# Patient Record
Sex: Male | Born: 1945 | Race: White | Hispanic: No | Marital: Married | Smoking: Former smoker
Health system: Southern US, Community
[De-identification: ages and names within clinical notes are randomized; demographics above are authoritative.]

## PROBLEM LIST (undated history)

## (undated) DIAGNOSIS — I447 Left bundle-branch block, unspecified: Secondary | ICD-10-CM

## (undated) DIAGNOSIS — Z77098 Contact with and (suspected) exposure to other hazardous, chiefly nonmedicinal, chemicals: Secondary | ICD-10-CM

## (undated) DIAGNOSIS — E119 Type 2 diabetes mellitus without complications: Secondary | ICD-10-CM

## (undated) DIAGNOSIS — Z860100 Personal history of colon polyps, unspecified: Secondary | ICD-10-CM

## (undated) DIAGNOSIS — Z950 Presence of cardiac pacemaker: Secondary | ICD-10-CM

## (undated) DIAGNOSIS — F0390 Unspecified dementia without behavioral disturbance: Secondary | ICD-10-CM

## (undated) DIAGNOSIS — I428 Other cardiomyopathies: Secondary | ICD-10-CM

## (undated) DIAGNOSIS — D649 Anemia, unspecified: Secondary | ICD-10-CM

## (undated) DIAGNOSIS — E7800 Pure hypercholesterolemia, unspecified: Secondary | ICD-10-CM

## (undated) DIAGNOSIS — C61 Malignant neoplasm of prostate: Secondary | ICD-10-CM

## (undated) DIAGNOSIS — I10 Essential (primary) hypertension: Secondary | ICD-10-CM

## (undated) DIAGNOSIS — I441 Atrioventricular block, second degree: Secondary | ICD-10-CM

## (undated) HISTORY — DX: Personal history of colon polyps, unspecified: Z86.0100

## (undated) HISTORY — DX: Left bundle-branch block, unspecified: I44.7

## (undated) HISTORY — DX: Atrioventricular block, second degree: I44.1

## (undated) HISTORY — PX: OTHER SURGICAL HISTORY: SHX169

## (undated) HISTORY — PX: PROSTATE BIOPSY: SHX241

## (undated) HISTORY — PX: TONSILLECTOMY: SUR1361

## (undated) HISTORY — PX: INCISION AND DRAINAGE PERIRECTAL ABSCESS: SHX1804

## (undated) HISTORY — PX: BASAL CELL CARCINOMA EXCISION: SHX1214

## (undated) HISTORY — DX: Personal history of colonic polyps: Z86.010

## (undated) HISTORY — DX: Other cardiomyopathies: I42.8

---

## 1978-11-08 HISTORY — PX: INGUINAL HERNIA REPAIR: SUR1180

## 2005-07-26 ENCOUNTER — Ambulatory Visit (HOSPITAL_COMMUNITY): Admission: RE | Admit: 2005-07-26 | Discharge: 2005-07-26 | Payer: Self-pay | Admitting: *Deleted

## 2005-07-26 ENCOUNTER — Encounter (INDEPENDENT_AMBULATORY_CARE_PROVIDER_SITE_OTHER): Payer: Self-pay | Admitting: Specialist

## 2006-11-13 LAB — HM COLONOSCOPY

## 2008-12-06 ENCOUNTER — Ambulatory Visit (HOSPITAL_COMMUNITY): Admission: RE | Admit: 2008-12-06 | Discharge: 2008-12-06 | Payer: Self-pay | Admitting: *Deleted

## 2008-12-06 ENCOUNTER — Encounter (INDEPENDENT_AMBULATORY_CARE_PROVIDER_SITE_OTHER): Payer: Self-pay | Admitting: *Deleted

## 2011-03-23 NOTE — Op Note (Signed)
NAME:  Kevin [name], Kevin [name] NO.:  [id number]   MEDICAL RECORD NO.:  [id number]          PATIENT TYPE:  AMB   LOCATION:  ENDO                         FACILITY:  [facility]   PHYSICIAN:  Georgiana Spinner, M.D.    DATE OF BIRTH:  [birth year]   DATE OF PROCEDURE:  DATE OF DISCHARGE:                               OPERATIVE REPORT   PROCEDURE:  Colonoscopy.   INDICATIONS:  Colon polyps.   ANESTHESIA:  Fentanyl 75 mcg, Versed 8 mg.   PROCEDURE:  With the patient mildly sedated in the left lateral  decubitus position, the Pentax videoscopic colonoscope was inserted in  the rectum after normal rectal exam was performed by me, and it was  passed under direct vision to the cecum with pressure applied.  The  cecum was identified by the ileocecal valve and appendiceal orifice,  both which were photographed.  From this point the colonoscope was  slowly withdrawn, taking circumferential views of colonic mucosa,  stopping only at the ascending colon near the hepatic flexure, where a  polyp was seen, photographed and removed using hot biopsy forceps  technique on a setting of 20/150 blended current.  We next stopped at 20  cm from the anal verge, at which point a second polyp was seen.  It,  too, was photographed and it was removed, again, using the same hot  biopsy forceps technique.  The endoscope was pulled back to the rectum,  which appeared normal on direct and showed hemorrhoids on retroflexed  view.  The endoscope was straightened and withdrawn.  The patient's  vital signs, pulse oximeter remained stable.  The patient tolerated the  procedure well without apparent complications.   FINDINGS:  Two small polyps as described above in ascending colon and at  20 cm from the anal verge.  Internal hemorrhoids were also noted,  otherwise an unremarkable exam.   PLAN:  Await biopsy report.  The patient will call me for results and  follow up with me as an outpatient.     ______________________________  Georgiana Spinner, M.D.     GMO/MEDQ  D:  12/06/2008  T:  12/06/2008  Job:  [id number]

## 2011-03-26 NOTE — Op Note (Signed)
NAME:  DIONTRE, HARPS NO.:  [id number]   MEDICAL RECORD NO.:  [id number]          PATIENT TYPE:  AMB   LOCATION:  ENDO                         FACILITY:  [facility]   PHYSICIAN:  Georgiana Spinner, M.D.    DATE OF BIRTH:  04/18/1946   DATE OF PROCEDURE:  07/26/2005  DATE OF DISCHARGE:                                 OPERATIVE REPORT   PROCEDURE:  Upper endoscopy with biopsy.   INDICATIONS:  Gastroesophageal reflux disease.   ANESTHESIA:  Demerol 50, Versed 7 milligrams.   DESCRIPTION OF PROCEDURE:  With the patient mildly sedated in the left  lateral decubitus position, the Olympus videoscopic endoscope was inserted  in the mouth, passed under direct vision through the esophagus which  appeared normal except there was question of Barrett's esophagus or  esophagitis seen distally which was photographed and biopsied. We entered  into the stomach. The fundus, body, antrum, duodenal bulb, second portion of  duodenum were visualized. From this point, the endoscope was slowly  withdrawn taking circumferential views of the duodenal mucosa until the  endoscope had been pulled back into the stomach, placed in retroflexion to  view the stomach from below. The endoscope was straightened and withdrawn  taking circumferential views of the remaining gastric and esophageal mucosa.  The patient's vital signs and pulse oximeter remained stable. The patient  tolerated the procedure well without apparent complications.   FINDINGS:  Question of Barrett's esophagus versus esophagitis, await biopsy  report. The patient will call me for results and follow-up with me as an  outpatient. Proceed to colonoscopy as planned.           ______________________________  Georgiana Spinner, M.D.     GMO/MEDQ  D:  07/26/2005  T:  07/26/2005  Job:  [id number]

## 2011-03-26 NOTE — Op Note (Signed)
NAME:  Kevin [name], Kevin [name] NO.:  [id number]   MEDICAL RECORD NO.:  [id number]          PATIENT TYPE:  AMB   LOCATION:  ENDO                         FACILITY:  [facility]   PHYSICIAN:  Georgiana Spinner, M.D.    DATE OF BIRTH:  12/18/1945   DATE OF PROCEDURE:  07/26/2005  DATE OF DISCHARGE:                                 OPERATIVE REPORT   PROCEDURE:  Colonoscopy.   INDICATIONS:  Colon polyps.   ANESTHESIA:  Demerol 40, Versed 3 milligrams.   PROCEDURE:  With the patient mildly sedated in the left lateral decubitus  position, the Olympus videoscopic colonoscope was inserted in the rectum  after a normal rectal exam and passed under direct vision to the cecum  identified by the ileocecal valve and appendiceal orifice. From this point,  the colonoscope was slowly withdrawn taking circumferential views of the  colonic mucosa stopping only in the descending colon where there were two  polyps seen, photographed and removed, one using snare cautery technique,  the other hot biopsy forceps technique, both with a setting of 20/200  blended current. The endoscope was then pulled all the way back to the  rectum which appeared normal other than a small polyp that was also removed  using hot biopsy forceps technique with the same setting and on retroflexed  view, there were internal hemorrhoids seen. The endoscope was straightened  and withdrawn. The patient's vital signs and pulse oximeter remained stable.  The patient tolerated the procedure well without apparent complications.   FINDINGS:  Internal hemorrhoids, polyps of descending colon and rectum.  Await biopsy report. The patient will call me for results and follow-up with  me as an outpatient           ______________________________  Georgiana Spinner, M.D.     GMO/MEDQ  D:  07/26/2005  T:  07/26/2005  Job:  [id number]

## 2011-04-14 ENCOUNTER — Ambulatory Visit (INDEPENDENT_AMBULATORY_CARE_PROVIDER_SITE_OTHER): Payer: BC Managed Care – PPO | Admitting: Family Medicine

## 2011-04-14 ENCOUNTER — Encounter: Payer: Self-pay | Admitting: Family Medicine

## 2011-04-14 ENCOUNTER — Telehealth: Payer: Self-pay | Admitting: Family Medicine

## 2011-04-14 DIAGNOSIS — G4700 Insomnia, unspecified: Secondary | ICD-10-CM

## 2011-04-14 DIAGNOSIS — G3184 Mild cognitive impairment, so stated: Secondary | ICD-10-CM

## 2011-04-14 DIAGNOSIS — F411 Generalized anxiety disorder: Secondary | ICD-10-CM

## 2011-04-14 DIAGNOSIS — F419 Anxiety disorder, unspecified: Secondary | ICD-10-CM

## 2011-04-14 DIAGNOSIS — E119 Type 2 diabetes mellitus without complications: Secondary | ICD-10-CM

## 2011-04-14 NOTE — Telephone Encounter (Signed)
Pt called and forgot to discuss a problem he is having with erectile dysfunction. Pt req a call back from Dr Caryl Never asap today.

## 2011-04-14 NOTE — Progress Notes (Signed)
  Subjective:    Patient ID: Kevin [name], male    DOB: 04/25/1946, 65 y.o.   MRN: [id number]  HPI Patient is seen to establish care. He gets most of his care through the [place] [facility].  He has chronic problems including type 2 diabetes, chronic anxiety, history of kidney stones. He takes simvastatin and lisinopril but denies history of hyperlipidemia or hypertension. Medications are reviewed. Also takes Ambien intermittently for sleep issues. Lorazepam rarely for severe anxiety.  Patient denies any myalgias from statin use but does have some possible mild short-term memory issues and wonders if this may be statin related.  History of hernia repair 1988 no other surgeries. No known drug allergies.  Family history is that of mother ovarian cancer age 70. Father died of leukemia at 37  Patient is married. Quit smoking 1985. No alcohol use. Tetanus 2010. Colonoscopy 2008.   Review of Systems  Constitutional: Negative for fever, chills, activity change, appetite change and fatigue.  Eyes: Negative for visual disturbance.  Respiratory: Negative for cough and shortness of breath.   Cardiovascular: Negative for chest pain, palpitations and leg swelling.  Gastrointestinal: Negative for abdominal pain.  Genitourinary: Negative for dysuria.  Musculoskeletal: Negative for back pain.  Skin: Negative for rash.  Neurological: Negative for dizziness, seizures and syncope.  Hematological: Negative for adenopathy. Does not bruise/bleed easily.  Psychiatric/Behavioral: Negative for dysphoric mood. The patient is nervous/anxious.        Objective:   Physical Exam  Constitutional: He is oriented to person, place, and time. He appears well-developed and well-nourished. No distress.  HENT:  Right Ear: External ear normal.  Left Ear: External ear normal.  Mouth/Throat: Oropharynx is clear and moist.  Neck: Neck supple. No thyromegaly present.  Cardiovascular: Normal rate, regular rhythm and  normal heart sounds.   No murmur heard. Pulmonary/Chest: Effort normal and breath sounds normal. No respiratory distress. He has no wheezes. He has no rales.  Musculoskeletal: He exhibits no edema.  Lymphadenopathy:    He has no cervical adenopathy.  Neurological: He is alert and oriented to person, place, and time.  Skin: No rash noted.  Psychiatric: He has a normal mood and affect.          Assessment & Plan:  #1 type 2 diabetes. Followed by the [facility] #2 question of dyslipidemia. Question mild cognitive impairment related to simvastatin. [facility] possible hydrophilic statin such as pravastatin or Crestor #3 history of chronic anxiety which appear to be more situational  #4 history of kidney stones

## 2011-04-14 NOTE — Telephone Encounter (Signed)
This is all yours. 

## 2011-04-14 NOTE — Telephone Encounter (Signed)
Called pt and message left.

## 2011-04-16 ENCOUNTER — Encounter: Payer: Self-pay | Admitting: Family Medicine

## 2011-04-16 DIAGNOSIS — E119 Type 2 diabetes mellitus without complications: Secondary | ICD-10-CM | POA: Insufficient documentation

## 2011-04-16 DIAGNOSIS — G4700 Insomnia, unspecified: Secondary | ICD-10-CM | POA: Insufficient documentation

## 2011-04-16 DIAGNOSIS — F419 Anxiety disorder, unspecified: Secondary | ICD-10-CM | POA: Insufficient documentation

## 2011-04-16 MED ORDER — SILDENAFIL CITRATE 100 MG PO TABS: 100.0000 mg | ORAL_TABLET | ORAL | Status: DC | PRN

## 2011-04-16 NOTE — Telephone Encounter (Signed)
I have called this pt and left message and have not heard back.

## 2011-04-16 NOTE — Telephone Encounter (Signed)
Pt has hx of erectile dysfunction. Has use Viagra in past.  Refill Viagra 100 mg one po qd prn , disp #10 with 11 refills.

## 2011-04-16 NOTE — Telephone Encounter (Signed)
Rx sent, pt aware 

## 2011-04-16 NOTE — Telephone Encounter (Signed)
I spoke with wife at home number and his work number was incorrect.   Pt cell is [phone]

## 2011-08-23 ENCOUNTER — Encounter (INDEPENDENT_AMBULATORY_CARE_PROVIDER_SITE_OTHER): Payer: Self-pay | Admitting: General Surgery

## 2011-08-23 ENCOUNTER — Ambulatory Visit (INDEPENDENT_AMBULATORY_CARE_PROVIDER_SITE_OTHER): Payer: BC Managed Care – PPO | Admitting: General Surgery

## 2011-08-23 VITALS — BP 142/68 | HR 51 | Temp 98.7°F | Ht 75.0 in | Wt 205.8 lb

## 2011-08-23 DIAGNOSIS — K611 Rectal abscess: Secondary | ICD-10-CM

## 2011-08-23 DIAGNOSIS — K612 Anorectal abscess: Secondary | ICD-10-CM

## 2011-08-23 NOTE — Patient Instructions (Signed)
Tomorrow soak and remove the packing. Soak twice daily and after bowel movements. Keep covered with gauze as long as there is any drainage. Complete her antibiotics and call if you're not feeling steadily better.

## 2011-08-23 NOTE — Progress Notes (Signed)
Subjective:   Rectal abscess  Patient ID: Kevin [name], male   DOB: 1946/07/04, 65 y.o.   MRN: [id number]  HPI Patient is a pleasant 65 year old male who has a history of a perirectal abscess drained by Dr. Luisa Hart in early 2011. He was seen in June 2011 with a probable fistula and Dr. Luisa Hart recommended exam under anesthesia and fistulotomy. However the patient felt better, thought it might have healed, and did not follow through with surgery. He felt well until about 4 days ago when he again developed pain and swelling at the same site in the left perirectal area. He has not had any drainage.  Past Medical History  Diagnosis Date  . Diabetes mellitus   . Chronic kidney disease     stones  . UTI (urinary tract infection)   . Peri-rectal abscess    Past Surgical History  Procedure Date  . Hernia repair 1980   Current Outpatient Prescriptions  Medication Sig Dispense Refill  . aspirin 81 MG tablet Take 81 mg by mouth daily.        . citalopram (CELEXA) 20 MG tablet Take 20 mg by mouth daily.        Marland Kitchen lisinopril (PRINIVIL,ZESTRIL) 40 MG tablet Take 40 mg by mouth. 1/2 tab daily        . Loratadine 10 MG CAPS Take by mouth daily.        Marland Kitchen LORazepam (ATIVAN) 0.5 MG tablet Take 0.5 mg by mouth. As needed       . metFORMIN (GLUCOPHAGE) 1000 MG tablet Take 1,000 mg by mouth 2 (two) times daily with a meal.        . Saw Palmetto, Serenoa repens, 1000 MG CAPS Take by mouth daily.        . sildenafil (VIAGRA) 100 MG tablet Take 1 tablet (100 mg total) by mouth as needed for erectile dysfunction.  10 tablet  11  . simvastatin (ZOCOR) 40 MG tablet Take 40 mg by mouth. 1/2 tab daily       . zolpidem (AMBIEN) 10 MG tablet Take 10 mg by mouth at bedtime as needed.         Allergies  Allergen Reactions  . Sulfa Antibiotics     Swelling of lips   History  Substance Use Topics  . Smoking status: Former Smoker -- 1.0 packs/day for 20 years    Types: Cigarettes    Quit date: 04/13/1984  .  Smokeless tobacco: Not on file  . Alcohol Use: No    Review of Systems  Constitutional: Negative.   Respiratory: Negative.   Cardiovascular: Negative.   Hematological: Negative.        Objective:   Physical Exam General: Well-developed alert male in no distress Skin: Warm and dry without infection Lungs: Clear without increased work of breathing Cardiovascular: Regular rate and rhythm without murmur. No edema. Abdomen: Generally soft and nontender. Rectal: In the left perirectal space just lateral to the previous I&D site is a 2-3 cm area of tenderness, erythema, and fluctuance. Neuro: Alert and fully oriented. Motor and sensory exam grossly normal.    Assessment:     Recurrent perirectal/perianal abscess. I recommended incision and drainage under local anesthesia today.  Procedure: Under local anesthesia I unroofed the area of fluctuance as close to the anus as possible and drained fairly large amount of thick purulent material. The cavity was packed with iodoform gauze. He will remove this tomorrow and begin soaking.    Plan:  I started him on Keflex 500 mg 3 times a day for the next week. He will call if he is not feeling slightly better. He will return again to see Dr. Luisa Hart in 3 weeks to assess for fistula.

## 2011-09-20 ENCOUNTER — Ambulatory Visit (INDEPENDENT_AMBULATORY_CARE_PROVIDER_SITE_OTHER): Payer: BC Managed Care – PPO | Admitting: Surgery

## 2011-09-20 ENCOUNTER — Encounter (INDEPENDENT_AMBULATORY_CARE_PROVIDER_SITE_OTHER): Payer: Self-pay | Admitting: Surgery

## 2011-09-20 VITALS — BP 118/84 | HR 48 | Temp 98.6°F | Resp 20 | Ht 75.0 in | Wt 202.4 lb

## 2011-09-20 DIAGNOSIS — Z9889 Other specified postprocedural states: Secondary | ICD-10-CM

## 2011-09-20 NOTE — Progress Notes (Signed)
The patient returns today. He was here about 3 weeks ago to have a perirectal abscess drained by Dr. Johna Sheriff. He is doing well. This is a recurrent abscess. I saw him about a year ago for the same problem. In followup we discussed an exam under anesthesia since this was a recurrent problem for him. He held off on this.  Exam: Anal canal was examined. The wound in the left perirectal space is clean, soft, and nontender.  Impression: Recurrent perirectal abscess  Plan: I discussed with him the need for exam under anesthesia since his problem is recurrent. He will return in January to discuss further. Return to clinic if symptoms worsen.

## 2011-09-20 NOTE — Patient Instructions (Signed)
Peri-Rectal Abscess Your caregiver has diagnosed you as having a peri-rectal abscess. This is an infected area near the rectum that is filled with pus. If the abscess is near the surface of the skin, your caregiver may open (incise) the area and drain the pus. HOME CARE INSTRUCTIONS   If your abscess was opened up and drained. A small piece of gauze may be placed in the opening so that it can drain. Do not remove the gauze unless directed by your caregiver.   A loose dressing may be placed over the abscess site. Change the dressing as often as necessary to keep it clean and dry.   After the drain is removed, the area may be washed with a gentle antiseptic (soap) four times per day.   A warm sitz bath, warm packs or heating pad may be used for pain relief, taking care not to burn yourself.   Return for a wound check in 1 day or as directed.   An "inflatable doughnut" may be used for sitting with added comfort. These can be purchased at a drugstore or medical supply house.   To reduce pain and straining with bowel movements, eat a high fiber diet with plenty of fruits and vegetables. Use stool softeners as recommended by your caregiver. This is especially important if narcotic type pain medications were prescribed as these may cause marked constipation.   Only take over-the-counter or prescription medicines for pain, discomfort, or fever as directed by your caregiver.  SEEK IMMEDIATE MEDICAL CARE IF:   You have increasing pain that is not controlled by medication.   There is increased inflammation (redness), swelling, bleeding, or drainage from the area.   An oral temperature above 102 F (38.9 C) develops.   You develop chills or generalized malaise (feel lethargic or feel "washed out").     You develop any new symptoms (problems) you feel may be related to your present problem.  Document Released: 10/22/2000 Document Revised: 07/07/2011 Document Reviewed: 10/22/2008 [employer]  Patient Information 2012 [employer]. 

## 2011-10-14 ENCOUNTER — Encounter: Payer: Self-pay | Admitting: Family Medicine

## 2011-10-14 ENCOUNTER — Ambulatory Visit (INDEPENDENT_AMBULATORY_CARE_PROVIDER_SITE_OTHER): Payer: BC Managed Care – PPO | Admitting: Family Medicine

## 2011-10-14 VITALS — BP 140/60 | Temp 98.2°F | Wt 201.0 lb

## 2011-10-14 DIAGNOSIS — E119 Type 2 diabetes mellitus without complications: Secondary | ICD-10-CM

## 2011-10-14 NOTE — Progress Notes (Signed)
  Subjective:    Patient ID: Kevin [name], male    DOB: 10-23-1946, 65 y.o.   MRN: [id number]  HPI  Medical followup. Followed every 6 months by the [facility].  Type 2 diabetes, insomnia and chronic anxiety. Job change within the past 6 months which has reduced stress level somewhat. Medications reviewed.  These are refilled through the [facility] health system. Denies side effects. No recent chest pain. No consistent exercise. Flu vaccine already received. Diabetes stable. No symptoms of hyperglycemia. Regular eye exams.   Review of Systems  Eyes: Negative for visual disturbance.  Respiratory: Negative for shortness of breath.   Cardiovascular: Negative for chest pain.  Genitourinary: Negative for dysuria.  Neurological: Negative for dizziness and headaches.       Objective:   Physical Exam  Constitutional: He appears well-developed and well-nourished. No distress.  HENT:  Mouth/Throat: Oropharynx is clear and moist.  Neck: Neck supple. No thyromegaly present.  Cardiovascular: Normal rate and regular rhythm.   Pulmonary/Chest: Effort normal and breath sounds normal. No respiratory distress. He has no wheezes. He has no rales.  Musculoskeletal: He exhibits no edema.  Lymphadenopathy:    He has no cervical adenopathy.          Assessment & Plan:  Type 2 diabetes. Reported good control. Patient does not recall last A1c. He'll send copy of recent labs from [place]. Schedule complete physical exam.

## 2011-10-14 NOTE — Patient Instructions (Signed)
[facility]. Consider scheduling complete physical at some point next year.

## 2011-10-29 ENCOUNTER — Encounter (INDEPENDENT_AMBULATORY_CARE_PROVIDER_SITE_OTHER): Payer: Self-pay | Admitting: Surgery

## 2011-10-29 ENCOUNTER — Ambulatory Visit (INDEPENDENT_AMBULATORY_CARE_PROVIDER_SITE_OTHER): Payer: BC Managed Care – PPO | Admitting: Surgery

## 2011-10-29 VITALS — BP 140/80 | HR 48 | Temp 98.2°F | Resp 12 | Ht 75.0 in | Wt 205.0 lb

## 2011-10-29 DIAGNOSIS — K603 Anal fistula: Secondary | ICD-10-CM | POA: Insufficient documentation

## 2011-10-29 MED ORDER — AMOXICILLIN-POT CLAVULANATE 875-125 MG PO TABS: 1.0000 | ORAL_TABLET | Freq: Two times a day (BID) | ORAL | Status: AC

## 2011-10-29 NOTE — Patient Instructions (Signed)
Anal Fistula An anal fistula is an abnormal tunnel that leads from the anal canal (which carries stool from the large intestine) to a hole in the skin near the anus (the opening through which stool passes out of your body).  CAUSES  Food you eat goes from your stomach into your intestine. As the food is digested, waste material (stool) forms. Stool passes through your large intestine, through the rectum and anal canal, and out of your body through the anus.  The anus has a number of tiny glands (clusters of specialized cells) that make lubricating fluid. Sometimes these glands can become infected. This type of infection may lead to the development of a pocket of pus (abscess). An anal fistula often develops after an infection or abscess; It is nearly always caused by a past anorectal abscess. You are at a higher risk of developing an anal fistula if you have:  Had an anal abscess.   Chronic inflammatory bowel disease, such as Crohn's disease or ulcerative colitis.   Conditions in which there are inflamed outpouchings of the intestinal wall (diverticulitis).   Colon or rectal cancer.   Sexually transmitted diseases involving the rectum, such as gonorrhea or chlamydia.   A history of anal radiation treatments, injury, or surgery.   An HIV infection.   A problem that has required treatment with steroid medicines for more than a short time.  SYMPTOMS   Anal pain, particularly around the area of a past abscess.   Drainage of pus, blood, stool or mucus from an opening in the skin.   Swelling around the skin opening.   Worn off skin around the opening.   A hot or red area near the anus.   Diarrhea.   Fever and chills.   Tiredness (fatigue).  DIAGNOSIS   In some cases, the opening of an anal fistula is easily seen during a physical exam.   A probe or scope may be used to help locate the opening of the fistula. In some cases, dye can be injected into the fistula opening, and X-rays  can be taken to find the exact location and path of the fistula.   A sample (biopsy) of the fistula tissue or anus may be taken to check for cancer.  TREATMENT   An anal fistula may need surgery to open it up and allow it to heal. This type of operation is called a fistulotomy.   A specialized kind of glue or plug to seal the fistula may be used.   An antibiotic may be prescribed to treat an existing infection.  HOME CARE INSTRUCTIONS   Take medications (such as antibiotics) as prescribed by your caregiver.   Only take over-the-counter or prescription medicine for pain, discomfort, or fever as directed by your caregiver.   Follow your prescribed diet. You may need a higher fiber diet to help avoid constipation.   Drink lots of water as directed.   Use a stool softener or laxative, if recommended.   A warm sitz bath several times a day may be soothing, as well as help with healing.   Follow excellent hygiene to keep the anal area as clean as possible. Consider using pre-moistened towelettes to keep the anal area clean after using the bathroom.  SEEK MEDICAL CARE IF:  You have increased pain not controlled with medications.   You notice new swelling, redness, or hotness in the anal area.   You develop any problems passing urine.   You develop a fever (more   than 100.5 F (38.1 C).  SEEK IMMEDIATE MEDICAL CARE IF:  You have severe, intolerable pain.   You have severe problems passing urine or cannot pass any urine at all.   You develop an unexplained oral temperature above 102.0 F (38.9 C).   You notice new or worsening leakage of blood, pus, mucus, or stool.  Document Released: 10/07/2008 Document Revised: 07/07/2011 Document Reviewed: 10/07/2008 [employer] Patient Information 2012 [employer]. 

## 2011-10-29 NOTE — Progress Notes (Signed)
Patient ID: BEN HABERMANN, male   DOB: 07-Sep-1946, 65 y.o.   MRN: [id number]  Chief Complaint  Patient presents with  . Follow-up    rectal pain    HPI AMIL BOUWMAN is a 65 y.o. male. HPI The patient returns in followup for recurrent perirectal abscesses. He's had drainage from his left anal canal.. No fevers or chills. The drainage is stopped. Past Medical History  Diagnosis Date  . Diabetes mellitus   . Chronic kidney disease     stones  . UTI (urinary tract infection)   . Peri-rectal abscess     Past Surgical History  Procedure Date  . Hernia repair 1980    [facility]    Family History  Problem Relation Age of Onset  . Cancer Mother     ovarian  . Leukemia Father     Social History History  Substance Use Topics  . Smoking status: Former Smoker -- 1.0 packs/day for 20 years    Types: Cigarettes    Quit date: 04/13/1984  . Smokeless tobacco: Not on file  . Alcohol Use: No    Allergies  Allergen Reactions  . Sulfa Antibiotics     Swelling of lips    Current Outpatient Prescriptions  Medication Sig Dispense Refill  . aspirin 81 MG tablet Take 81 mg by mouth daily.        . citalopram (CELEXA) 20 MG tablet Take 20 mg by mouth daily.        Marland Kitchen lisinopril (PRINIVIL,ZESTRIL) 40 MG tablet 1/2 tab daily       . Loratadine 10 MG CAPS Take by mouth daily.        Marland Kitchen LORazepam (ATIVAN) 0.5 MG tablet Take 0.5 mg by mouth. As needed       . metFORMIN (GLUCOPHAGE) 1000 MG tablet Take 1,000 mg by mouth 2 (two) times daily with a meal.        . Saw Palmetto, Serenoa repens, 1000 MG CAPS Take by mouth daily.        . sildenafil (VIAGRA) 100 MG tablet Take 1 tablet (100 mg total) by mouth as needed for erectile dysfunction.  10 tablet  11  . simvastatin (ZOCOR) 40 MG tablet 1/2 tab daily      . zolpidem (AMBIEN) 10 MG tablet Take 10 mg by mouth at bedtime as needed.        Marland Kitchen amoxicillin-clavulanate (AUGMENTIN) 875-125 MG per tablet Take 1 tablet by mouth 2 (two) times  daily.  20 tablet  0    Review of Systems Review of Systems  Constitutional: Negative for fever, chills and unexpected weight change.  HENT: Negative for hearing loss, congestion, sore throat, trouble swallowing and voice change.   Eyes: Negative for visual disturbance.  Respiratory: Negative for cough and wheezing.   Cardiovascular: Negative for chest pain, palpitations and leg swelling.  Gastrointestinal: Negative for nausea, vomiting, abdominal pain, diarrhea, constipation, blood in stool, abdominal distention, anal bleeding and rectal pain.  Genitourinary: Negative for hematuria and difficulty urinating.  Musculoskeletal: Negative for arthralgias.  Skin: Negative for rash and wound.  Neurological: Negative for seizures, syncope, weakness and headaches.  Hematological: Negative for adenopathy. Does not bruise/bleed easily.  Psychiatric/Behavioral: Negative for confusion.    Blood pressure 140/80, pulse 48, temperature 98.2 F (36.8 C), resp. rate 12, height 6\' 3"  (1.905 m), weight 205 lb (92.987 kg).  Physical Exam Physical Exam  Constitutional: He appears well-developed and well-nourished.  HENT:  Head: Normocephalic and atraumatic.  Eyes: EOM are normal. Pupils are equal, round, and reactive to light.  Neck: Normal range of motion.  Cardiovascular: Normal rate and regular rhythm.   Pulmonary/Chest: Effort normal and breath sounds normal.  Abdominal: Soft. Bowel sounds are normal.  Genitourinary:       Fistula tract there is left lateral anal canal about 2 cm in the anal verge with mild erythema. No evidence of abscess.    Data Reviewed   Assessment    Fistula in ano    Plan    I discussed medical and surgical options of treatment. Exam under anesthesia with possible fistulotomy recommended. I discussed the use of a fistula plug and a seton. Risk of bleeding, infection, fecal incontinence, pelvic sepsis, exacerbation medical problems, DVT, and cardiovascular  complications of the procedure. If this fails, he may require an advancement flap. He wishes to proceed.       Dontavius Keim A. 10/29/2011, 10:24 AM

## 2011-11-11 ENCOUNTER — Ambulatory Visit
Admission: RE | Admit: 2011-11-11 | Discharge: 2011-11-11 | Disposition: A | Discharge status: Home / Self Care | Payer: PRIVATE HEALTH INSURANCE | Source: Ambulatory Visit | Attending: Surgery | Admitting: Surgery

## 2011-11-11 ENCOUNTER — Other Ambulatory Visit (INDEPENDENT_AMBULATORY_CARE_PROVIDER_SITE_OTHER): Payer: Self-pay | Admitting: Surgery

## 2011-11-11 DIAGNOSIS — Z01811 Encounter for preprocedural respiratory examination: Secondary | ICD-10-CM

## 2011-11-22 ENCOUNTER — Encounter (INDEPENDENT_AMBULATORY_CARE_PROVIDER_SITE_OTHER): Payer: BC Managed Care – PPO | Admitting: Surgery

## 2011-11-24 ENCOUNTER — Other Ambulatory Visit: Payer: Self-pay | Admitting: Cardiovascular Disease

## 2011-11-24 ENCOUNTER — Encounter (HOSPITAL_COMMUNITY): Payer: Self-pay | Admitting: Pharmacy Technician

## 2011-11-30 MED ORDER — SODIUM CHLORIDE 0.9 % IR SOLN
80.0000 mg | Status: AC
  Administered 2011-12-01: 80 mg
  Filled 2011-11-30: qty 2

## 2011-12-01 ENCOUNTER — Encounter (HOSPITAL_COMMUNITY): Payer: Self-pay | Admitting: General Practice

## 2011-12-01 ENCOUNTER — Encounter (HOSPITAL_COMMUNITY)
Admission: RE | Disposition: A | Discharge status: Home / Self Care | Payer: Self-pay | Source: Ambulatory Visit | Attending: Cardiovascular Disease

## 2011-12-01 ENCOUNTER — Ambulatory Visit (HOSPITAL_COMMUNITY)
Admission: RE | Admit: 2011-12-01 | Discharge: 2011-12-02 | Disposition: A | Discharge status: Home / Self Care | Payer: No Typology Code available for payment source | Source: Ambulatory Visit | Attending: Cardiovascular Disease | Admitting: Cardiovascular Disease

## 2011-12-01 DIAGNOSIS — I441 Atrioventricular block, second degree: Secondary | ICD-10-CM | POA: Insufficient documentation

## 2011-12-01 DIAGNOSIS — Z950 Presence of cardiac pacemaker: Secondary | ICD-10-CM

## 2011-12-01 DIAGNOSIS — I452 Bifascicular block: Secondary | ICD-10-CM | POA: Insufficient documentation

## 2011-12-01 HISTORY — PX: PERMANENT PACEMAKER INSERTION: SHX5480

## 2011-12-01 HISTORY — DX: Anemia, unspecified: D64.9

## 2011-12-01 LAB — BASIC METABOLIC PANEL
GFR calc Af Amer: >90 mL/min (ref >90)
GFR calc non Af Amer: >90 mL/min (ref >90)
Glucose, Bld: 140 mg/dL — ABNORMAL HIGH (ref 70–99)
Potassium: 4.1 mEq/L (ref 3.5–5.1)
Sodium: 140 mEq/L (ref 135–145)

## 2011-12-01 LAB — GLUCOSE, CAPILLARY: Glucose-Capillary: 77 mg/dL (ref 70–99)

## 2011-12-01 LAB — CBC
Hemoglobin: 15.6 g/dL (ref 13.0–17.0)
MCHC: 34.7 g/dL (ref 30.0–36.0)
RDW: 13 % (ref 11.5–15.5)

## 2011-12-01 LAB — SURGICAL PCR SCREEN
MRSA, PCR: NEGATIVE
Staphylococcus aureus: NEGATIVE

## 2011-12-01 SURGERY — PERMANENT PACEMAKER INSERTION
Anesthesia: LOCAL

## 2011-12-01 MED ORDER — CEFAZOLIN SODIUM 1-5 GM-% IV SOLN
1.0000 g | Freq: Four times a day (QID) | INTRAVENOUS | Status: AC
  Administered 2011-12-01 – 2011-12-02 (×3): 1 g via INTRAVENOUS
  Filled 2011-12-01 (×3): qty 50

## 2011-12-01 MED ORDER — ONDANSETRON HCL 4 MG/2ML IJ SOLN: 4.0000 mg | Freq: Four times a day (QID) | INTRAMUSCULAR | Status: DC | PRN

## 2011-12-01 MED ORDER — MUPIROCIN 2 % EX OINT
TOPICAL_OINTMENT | CUTANEOUS | Status: AC
  Administered 2011-12-01: 1 via NASAL
  Filled 2011-12-01: qty 22

## 2011-12-01 MED ORDER — FENTANYL CITRATE 0.05 MG/ML IJ SOLN
INTRAMUSCULAR | Status: AC
  Filled 2011-12-01: qty 2

## 2011-12-01 MED ORDER — CEFAZOLIN SODIUM-DEXTROSE 2-3 GM-% IV SOLR
INTRAVENOUS | Status: AC
  Filled 2011-12-01: qty 50

## 2011-12-01 MED ORDER — CEFAZOLIN SODIUM-DEXTROSE 2-3 GM-% IV SOLR
2.0000 g | Freq: Once | INTRAVENOUS | Status: AC
  Administered 2011-12-01: 2 g via INTRAVENOUS
  Filled 2011-12-01: qty 50

## 2011-12-01 MED ORDER — ASPIRIN 81 MG PO CHEW
81.0000 mg | CHEWABLE_TABLET | Freq: Every day | ORAL | Status: DC
  Administered 2011-12-01 – 2011-12-02 (×2): 81 mg via ORAL
  Filled 2011-12-01 (×2): qty 1

## 2011-12-01 MED ORDER — LORAZEPAM 0.5 MG PO TABS: 0.5000 mg | ORAL_TABLET | Freq: Four times a day (QID) | ORAL | Status: DC | PRN

## 2011-12-01 MED ORDER — LISINOPRIL 20 MG PO TABS
20.0000 mg | ORAL_TABLET | Freq: Every day | ORAL | Status: DC
  Administered 2011-12-01 – 2011-12-02 (×2): 20 mg via ORAL
  Filled 2011-12-01 (×2): qty 1

## 2011-12-01 MED ORDER — SODIUM CHLORIDE 0.9 % IV SOLN
INTRAVENOUS | Status: DC
  Administered 2011-12-01: 12:00:00 via INTRAVENOUS

## 2011-12-01 MED ORDER — LIDOCAINE HCL (PF) 1 % IJ SOLN
INTRAMUSCULAR | Status: AC
  Filled 2011-12-01: qty 60

## 2011-12-01 MED ORDER — HEPARIN (PORCINE) IN NACL 2-0.9 UNIT/ML-% IJ SOLN
INTRAMUSCULAR | Status: AC
  Filled 2011-12-01: qty 1000

## 2011-12-01 MED ORDER — MIDAZOLAM HCL 5 MG/5ML IJ SOLN
INTRAMUSCULAR | Status: AC
  Filled 2011-12-01: qty 5

## 2011-12-01 MED ORDER — ACETAMINOPHEN 325 MG PO TABS: 325.0000 mg | ORAL_TABLET | ORAL | Status: DC | PRN

## 2011-12-01 MED ORDER — HYDROCODONE-ACETAMINOPHEN 5-325 MG PO TABS
1.0000 | ORAL_TABLET | ORAL | Status: DC | PRN
  Administered 2011-12-01: 2 via ORAL
  Filled 2011-12-01: qty 2

## 2011-12-01 MED ORDER — MUPIROCIN 2 % EX OINT
TOPICAL_OINTMENT | Freq: Two times a day (BID) | CUTANEOUS | Status: DC
  Administered 2011-12-01: 1 via NASAL
  Filled 2011-12-01: qty 22

## 2011-12-01 MED ORDER — METFORMIN HCL 500 MG PO TABS
1000.0000 mg | ORAL_TABLET | Freq: Two times a day (BID) | ORAL | Status: DC
  Administered 2011-12-01 – 2011-12-02 (×2): 1000 mg via ORAL
  Filled 2011-12-01 (×4): qty 2

## 2011-12-01 MED ORDER — CHLORHEXIDINE GLUCONATE 4 % EX LIQD
60.0000 mL | Freq: Once | CUTANEOUS | Status: DC
  Filled 2011-12-01: qty 60

## 2011-12-01 MED ORDER — CITALOPRAM HYDROBROMIDE 20 MG PO TABS
20.0000 mg | ORAL_TABLET | Freq: Every day | ORAL | Status: DC
  Administered 2011-12-01 – 2011-12-02 (×2): 20 mg via ORAL
  Filled 2011-12-01 (×2): qty 1

## 2011-12-01 MED ORDER — SODIUM CHLORIDE 0.45 % IV SOLN: INTRAVENOUS | Status: DC

## 2011-12-01 MED ORDER — SODIUM CHLORIDE 0.9 % IV SOLN
INTRAVENOUS | Status: AC
  Administered 2011-12-01: 17:00:00 via INTRAVENOUS

## 2011-12-01 MED ORDER — SIMVASTATIN 20 MG PO TABS
20.0000 mg | ORAL_TABLET | Freq: Every day | ORAL | Status: DC
  Administered 2011-12-01: 20 mg via ORAL
  Filled 2011-12-01 (×2): qty 1

## 2011-12-01 NOTE — Op Note (Signed)
Kevin [name], Kevin [name] Male, 66 y.o., [birth year]  MRN: [id number]   Procedure report  Procedure performed:  1. Implantation of new dual chamber permanent pacemaker  2. Fluoroscopy  3.  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  Procedure performed by: [name], MD  Complications: None  Estimated blood loss: <10 mL  Medications administered during procedure: Ancef 2 g intravenously Lidocaine 1% 30 mL locally,  Fentanyl 50 mcg intravenously Versed 3 mg intravenously  Device details:  Generator Medtronic adapta model ADDR L1 serial number [id number] H Right atrial lead Medtronic 5076-52 cm serial number [id number] Right ventricular lead Medtronic 5076/58 cm serial number [id number]  Procedure details:  After the risks and benefits of the procedure were discussed the patient provided informed consent and was brought to the cardiac cath lab in the fasting state. The patient was prepped and draped in usual sterile fashion. Local anesthesia with 1% lidocaine was administered to to the Left infraclavicular area. A 5-6 cm horizontal incision was made parallel with and 2-3 cm caudal to the left clavicle. Using electrocautery and blunt dissection a prepectoral pocket was created down to the level of the pectoralis major muscle fascia. The pocket was carefully inspected for hemostasis. An antibiotic-soaked sponge was placed in the pocket.  Under fluoroscopic guidance and using the modified Seldinger technique 2 separate venipunctures were performed to access the left subclavian vein. no difficulty was encountered accessing the vein.  Two J-tip guidewires were subsequently exchanged for 2 7 French safe sheaths.  Under fluoroscopic guidance the ventricular lead was advanced to level of the mid to apical right ventricular septum and thet active-fixation helix was  deployed. Prominent current of injury was seen. Satisfactory pacing and sensing parameters were recorded. There was no evidence of diaphragmatic stimulation at maximum device output. The safe sheath was peeled away and the lead was secured in place with 2-0 silk.  In similar fashion the right atrial lead was advanced to the level of the atrial appendage. The active-fixation helix was deployed. There was prominent current of injury. Satisfactory  pacing and sensing parameters were recorded. There was no evidence of diaphragmatic stimulation with pacing at maximum device output. The safe sheath was peeled away and the lead was secured in place with 2-0 silk.  The antibiotic-soaked sponge was removed from the pocket. The pocket was flushed with copious amounts of antibiotic solution. Reinspection showed excellent hemostasis..  The ventricular lead was connected to the generator and appropriate ventricular pacing was seen. Subsequently the atrial lead was also connected. Repeat testing of the lead parameters later showed excellent values.  The entire system was then carefully inserted in the pocket with care been taking that the leads and device assumed a comfortable position without pressure on the incision. Great care was taken that the leads be located deep to the generator. The pocket was then closed in layers using 2 layers of 2-0 Vicryl and cutaneous staples, after which a sterile dressing was applied.  At the end of the procedure the following lead parameters were encountered:  Right atrial lead  sensed P waves 325 mV, impedance 740 ohms, threshold 1.5 V at 0.5 ms pulse width.  Right ventricular lead  sensed R waves 12.4 mV, impedance 1221ohms, threshold 1.2 V at 0.5 ms pulse width.   MV:HQIONGE [name].D.

## 2011-12-01 NOTE — H&P (Signed)
History reviewed, patient examined, no change in status, stable for surgery. [name], MD, [facility] [phone] 12/01/2011 12:06 PM

## 2011-12-02 ENCOUNTER — Ambulatory Visit (HOSPITAL_COMMUNITY): Payer: No Typology Code available for payment source

## 2011-12-02 LAB — GLUCOSE, CAPILLARY: Glucose-Capillary: 129 mg/dL — ABNORMAL HIGH (ref 70–99)

## 2011-12-02 MED ORDER — ACETAMINOPHEN 325 MG PO TABS: 325.0000 mg | ORAL_TABLET | ORAL | Status: DC | PRN

## 2011-12-02 NOTE — Discharge Summary (Signed)
Physician Discharge Summary  Patient ID: Kevin [name] MRN: [id number] DOB/AGE: 1946/04/15 66 y.o.  Admit date: 12/01/2011 Discharge date: 12/02/2011  Admission Diagnoses:  Discharge Diagnoses:  Active Problems: LBBB/RBBB, alternating MobitzI/II S/P dual chamber PPM    Discharged Condition: stable  Hospital Course:  Patient is a 66 year old Caucasian male with history of right and left bundle-branch block and Mobitz 12 arrhythmias. He presented for implantation of dual-chamber permanent pacemaker.  Device specifics follow:  Generator Medtronic adapta model ADDR L1 serial number [id number]  Right atrial lead Medtronic [id number] cm serial number [id number]  Right ventricular lead Medtronic 5076/58 cm serial number [id number]  Implantation was completed without complications. Chest x-ray shows no pneumothorax.  Patient was seen by Dr. Gery Pray is stable for discharge home. Followup appointment will be scheduled in 7-10 days for staple removal.  Proper discharge instructions were supplied to patient.  Significant Diagnostic Studies:  . Implantation of new dual chamber permanent pacemaker  2. Fluoroscopy  3. 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  Procedure performed by:  [name], MD  Complications:  None  Estimated blood loss:  <10 mL  Medications administered during procedure:  Ancef 2 g intravenously  Lidocaine 1% 30 mL locally,  Fentanyl 50 mcg intravenously  Versed 3 mg intravenously  Device details:  Generator Medtronic adapta model ADDR L1 serial number [id number]  Right atrial lead Medtronic 5076-52 cm serial number [id number]  Right ventricular lead Medtronic 5076/58 cm serial number [id number]  Procedure details:  After the risks and benefits of the procedure were discussed the patient provided informed consent and was brought to the  cardiac cath lab in the fasting state. The patient was prepped and draped in usual sterile fashion. Local anesthesia with 1% lidocaine was administered to to the Left infraclavicular area. A 5-6 cm horizontal incision was made parallel with and 2-3 cm caudal to the left clavicle. Using electrocautery and blunt dissection a prepectoral pocket was created down to the level of the pectoralis major muscle fascia. The pocket was carefully inspected for hemostasis. An antibiotic-soaked sponge was placed in the pocket.  Under fluoroscopic guidance and using the modified Seldinger technique 2 separate venipunctures were performed to access the left subclavian vein. no difficulty was encountered accessing the vein. Two J-tip guidewires were subsequently exchanged for 2 7 French safe sheaths.  Under fluoroscopic guidance the ventricular lead was advanced to level of the mid to apical right ventricular septum and thet active-fixation helix was deployed. Prominent current of injury was seen. Satisfactory pacing and sensing parameters were recorded. There was no evidence of diaphragmatic stimulation at maximum device output. The safe sheath was peeled away and the lead was secured in place with 2-0 silk.  In similar fashion the right atrial lead was advanced to the level of the atrial appendage. The active-fixation helix was deployed. There was prominent current of injury. Satisfactory pacing and sensing parameters were recorded. There was no evidence of diaphragmatic stimulation with pacing at maximum device output. The safe sheath was peeled away and the lead was secured in place with 2-0 silk.  The antibiotic-soaked sponge was removed from the pocket. The pocket was flushed with copious amounts of antibiotic solution. Reinspection showed excellent hemostasis..  The ventricular lead was connected to the generator and appropriate ventricular pacing was seen. Subsequently the atrial lead was also connected. Repeat testing  of the lead parameters  later showed excellent values.  The entire system was then carefully inserted in the pocket with care been taking that the leads and device assumed a comfortable position without pressure on the incision. Great care was taken that the leads be located deep to the generator. The pocket was then closed in layers using 2 layers of 2-0 Vicryl and cutaneous staples, after which a sterile dressing was applied.  At the end of the procedure the following lead parameters were encountered:  Right atrial lead  sensed P waves 325 mV, impedance 740 ohms, threshold 1.5 V at 0.5 ms pulse width.  Right ventricular lead  sensed R waves 12.4 mV, impedance 1221ohms, threshold 1.2 V at 0.5 ms pulse width.    Discharge Exam: Blood pressure 154/87, pulse 71, temperature 98.3 F (36.8 C), temperature source Oral, resp. rate 16, height 6\' 3"  (1.905 m), weight 87.998 kg (194 lb), SpO2 90.00%.   Disposition: Final discharge disposition not confirmed  Discharge Orders    Future Orders Please Complete By Expires   Diet - low sodium heart healthy      Increase activity slowly      Call MD for:  redness, tenderness, or signs of infection (pain, swelling, redness, odor or green/yellow discharge around incision site)        Medication List  As of 12/02/2011 11:03 AM   TAKE these medications         acetaminophen 325 MG tablet   Commonly known as: TYLENOL   Take 1-2 tablets (325-650 mg total) by mouth every 4 (four) hours as needed.      aspirin 81 MG tablet   Take 81 mg by mouth daily.      citalopram 20 MG tablet   Commonly known as: CELEXA   Take 20 mg by mouth daily.      lisinopril 40 MG tablet   Commonly known as: PRINIVIL,ZESTRIL   Take 20 mg by mouth daily.      Loratadine 10 MG Caps   Take 10 mg by mouth daily.      LORazepam 0.5 MG tablet   Commonly known as: ATIVAN   Take 0.5 mg by mouth daily as needed. anxiety      metFORMIN 1000 MG tablet   Commonly known as:  GLUCOPHAGE   Take 1,000 mg by mouth 2 (two) times daily with a meal.      Saw Palmetto (Serenoa repens) 1000 MG Caps   Take by mouth daily.      simvastatin 40 MG tablet   Commonly known as: ZOCOR   Take 20 mg by mouth at bedtime.      vitamin E 100 UNIT capsule   Take 100 Units by mouth daily.           Follow-up Information    Follow up with [name], MD. (Our office will call with appointment date and time.  The appt will be in 7-10 days.)    Contact information:   [address] [place] [address] [phone]          Signed: Dwana Melena 12/02/2011, 11:03 AM

## 2011-12-02 NOTE — Progress Notes (Signed)
[facility]  Subjective: No Complaints.  Objective: Vital signs in last 24 hours: Temp:  [97.8 F (36.6 C)-98.3 F (36.8 C)] 98.3 F (36.8 C) (01/24 0527) Pulse Rate:  [40-86] 71  (01/24 0527) Resp:  [16-18] 16  (01/24 0527) BP: (154-170)/(80-90) 154/87 mmHg (01/24 0527) SpO2:  [90 %-97 %] 90 % (01/24 0527) Weight:  [87.998 kg (194 lb)] 87.998 kg (194 lb) (01/23 1131) Last BM Date: 12/01/11  Intake/Output from previous day: 01/23 0701 - 01/24 0700 In: 185 [I.V.:85; IV Piggyback:100] Out: -  Intake/Output this shift:    Medications Current Facility-Administered Medications  Medication Dose Route Frequency Provider Last Rate Last Dose  . 0.9 %  sodium chloride infusion   Intravenous Continuous Mihai [name], MD 75 mL/hr at 12/01/11 1652    . acetaminophen (TYLENOL) tablet 325-650 mg  325-650 mg Oral Q4H PRN Mihai [name], MD      . aspirin chewable tablet 81 mg  81 mg Oral Daily Mihai [name], MD   81 mg at 12/01/11 1710  . ceFAZolin (ANCEF) 2-3 GM-% IVPB SOLR           . ceFAZolin (ANCEF) IVPB 1 g/50 mL premix  1 g Intravenous Q6H Mihai [name], MD   1 g at 12/02/11 0420  . ceFAZolin (ANCEF) IVPB 2 g/50 mL premix  2 g Intravenous Once [name], MD   2 g at 12/01/11 1425  . citalopram (CELEXA) tablet 20 mg  20 mg Oral Daily Mihai [name], MD   20 mg at 12/01/11 1710  . fentaNYL (SUBLIMAZE) 0.05 MG/ML injection           . gentamicin (GARAMYCIN) 80 mg in sodium chloride irrigation 0.9 % 500 mL irrigation  80 mg Irrigation On Call [name], MD   80 mg at 12/01/11 1522  . heparin 2-0.9 UNIT/ML-% infusion           . HYDROcodone-acetaminophen (NORCO) 5-325 MG per tablet 1-2 tablet  1-2 tablet Oral Q4H PRN [name], MD   2 tablet at 12/01/11 2139  . lidocaine (XYLOCAINE) 1 % injection           . lisinopril (PRINIVIL,ZESTRIL) tablet 20 mg  20 mg Oral Daily Mihai [name], MD   20 mg at 12/01/11 1710  . LORazepam (ATIVAN) tablet  0.5 mg  0.5 mg Oral Q6H PRN Mihai [name], MD      . metFORMIN (GLUCOPHAGE) tablet 1,000 mg  1,000 mg Oral BID WC Mihai [name], MD   1,000 mg at 12/02/11 0634  . midazolam (VERSED) 5 MG/5ML injection           . ondansetron (ZOFRAN) injection 4 mg  4 mg Intravenous Q6H PRN Mihai [name], MD      . simvastatin (ZOCOR) tablet 20 mg  20 mg Oral QHS Mihai [name], MD   20 mg at 12/01/11 2140  . DISCONTD: 0.45 % sodium chloride infusion   Intravenous Continuous Mihai [name], MD      . DISCONTD: 0.9 %  sodium chloride infusion   Intravenous Continuous Mihai [name], MD 50 mL/hr at 12/01/11 1218    . DISCONTD: chlorhexidine (HIBICLENS) 4 % liquid 4 application  60 mL Topical Once [name], MD      . DISCONTD: mupirocin ointment (BACTROBAN) 2 %   Nasal BID [name], MD   1 application at 12/01/11 1218    PE: General appearance: alert, cooperative and no distress Lungs: clear to auscultation bilaterally Heart: regular  rate and rhythm, S1, S2 normal, no murmur, click, rub or gallop Extremities: No LEE Pulses: 2+ and symmetric radials  Lab Results:   Basename 12/01/11 1159  WBC 6.9  HGB 15.6  HCT 44.9  PLT 160   BMET  Basename 12/01/11 1159  NA 140  K 4.1  CL 105  CO2 26  GLUCOSE 140*  BUN 10  CREATININE 0.75  CALCIUM 9.1    Studies/Results: CXR Pending.  Procedure performed: 12/01/11 1. Implantation of new dual chamber permanent pacemaker  2. Fluoroscopy  3. 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  Procedure performed by:  [name], MD  Complications:  None  Estimated blood loss:  <10 mL  Medications administered during procedure:  Ancef 2 g intravenously  Lidocaine 1% 30 mL locally,  Fentanyl 50 mcg intravenously  Versed 3 mg intravenously  Device details:  Generator Medtronic adapta model ADDR L1 serial number [id number]   Right atrial lead Medtronic 5076-52 cm serial number [id number]  Right ventricular lead Medtronic 5076/58 cm serial number [id number]  Procedure details:  After the risks and benefits of the procedure were discussed the patient provided informed consent and was brought to the cardiac cath lab in the fasting state. The patient was prepped and draped in usual sterile fashion. Local anesthesia with 1% lidocaine was administered to to the Left infraclavicular area. A 5-6 cm horizontal incision was made parallel with and 2-3 cm caudal to the left clavicle. Using electrocautery and blunt dissection a prepectoral pocket was created down to the level of the pectoralis major muscle fascia. The pocket was carefully inspected for hemostasis. An antibiotic-soaked sponge was placed in the pocket.  Under fluoroscopic guidance and using the modified Seldinger technique 2 separate venipunctures were performed to access the left subclavian vein. no difficulty was encountered accessing the vein. Two J-tip guidewires were subsequently exchanged for 2 7 French safe sheaths.  Under fluoroscopic guidance the ventricular lead was advanced to level of the mid to apical right ventricular septum and thet active-fixation helix was deployed. Prominent current of injury was seen. Satisfactory pacing and sensing parameters were recorded. There was no evidence of diaphragmatic stimulation at maximum device output. The safe sheath was peeled away and the lead was secured in place with 2-0 silk.  In similar fashion the right atrial lead was advanced to the level of the atrial appendage. The active-fixation helix was deployed. There was prominent current of injury. Satisfactory pacing and sensing parameters were recorded. There was no evidence of diaphragmatic stimulation with pacing at maximum device output. The safe sheath was peeled away and the lead was secured in place with 2-0 silk.  The antibiotic-soaked sponge was removed from the  pocket. The pocket was flushed with copious amounts of antibiotic solution. Reinspection showed excellent hemostasis..  The ventricular lead was connected to the generator and appropriate ventricular pacing was seen. Subsequently the atrial lead was also connected. Repeat testing of the lead parameters later showed excellent values.  The entire system was then carefully inserted in the pocket with care been taking that the leads and device assumed a comfortable position without pressure on the incision. Great care was taken that the leads be located deep to the generator. The pocket was then closed in layers using 2 layers of 2-0 Vicryl and cutaneous staples, after which a sterile dressing was applied.  At the end of  the procedure the following lead parameters were encountered:  Right atrial lead  sensed P waves 325 mV, impedance 740 ohms, threshold 1.5 V at 0.5 ms pulse width.  Right ventricular lead  sensed R waves 12.4 mV, impedance 1221ohms, threshold 1.2 V at 0.5 ms pulse width.   Assessment/Plan   Active Problems: LBBB/RBBB, alternating Mobitz I/II  Plan:  S/P PPM implant.  Waintg on CXR.  HR stable.  BP slightly elevated.  DC home today if no complications on CXR.   LOS: 1 day    HAGER,BRYAN W 12/02/2011 9:04 AM   Agree with note written by Jones Skene PAC  S/P PTVPM by Dr. Salena Saner. Site with slight ooze. CXR pending. Interrogated by rep this morning and functioning appropriately. D/C home after CXR and ROV with DR. Zachary George 12/02/2011 9:40 AM

## 2012-01-21 HISTORY — PX: NM MYOCAR PERF WALL MOTION: HXRAD629

## 2012-01-25 ENCOUNTER — Other Ambulatory Visit: Payer: Self-pay | Admitting: Cardiovascular Disease

## 2012-01-27 ENCOUNTER — Encounter (HOSPITAL_COMMUNITY): Payer: Self-pay

## 2012-01-27 ENCOUNTER — Ambulatory Visit (HOSPITAL_COMMUNITY)
Admission: RE | Admit: 2012-01-27 | Discharge: 2012-01-27 | Disposition: A | Discharge status: Home / Self Care | Payer: No Typology Code available for payment source | Source: Ambulatory Visit | Attending: Cardiovascular Disease | Admitting: Cardiovascular Disease

## 2012-01-27 ENCOUNTER — Ambulatory Visit (HOSPITAL_COMMUNITY): Admit: 2012-01-27 | Payer: No Typology Code available for payment source | Admitting: Cardiovascular Disease

## 2012-01-27 ENCOUNTER — Encounter (HOSPITAL_COMMUNITY)
Admission: RE | Disposition: A | Discharge status: Home / Self Care | Payer: Self-pay | Source: Ambulatory Visit | Attending: Cardiovascular Disease

## 2012-01-27 DIAGNOSIS — R9389 Abnormal findings on diagnostic imaging of other specified body structures: Secondary | ICD-10-CM | POA: Insufficient documentation

## 2012-01-27 DIAGNOSIS — Z7982 Long term (current) use of aspirin: Secondary | ICD-10-CM | POA: Insufficient documentation

## 2012-01-27 DIAGNOSIS — Z950 Presence of cardiac pacemaker: Secondary | ICD-10-CM | POA: Insufficient documentation

## 2012-01-27 DIAGNOSIS — L821 Other seborrheic keratosis: Secondary | ICD-10-CM | POA: Insufficient documentation

## 2012-01-27 DIAGNOSIS — Z79899 Other long term (current) drug therapy: Secondary | ICD-10-CM | POA: Insufficient documentation

## 2012-01-27 DIAGNOSIS — I428 Other cardiomyopathies: Secondary | ICD-10-CM | POA: Insufficient documentation

## 2012-01-27 DIAGNOSIS — I517 Cardiomegaly: Secondary | ICD-10-CM | POA: Insufficient documentation

## 2012-01-27 HISTORY — PX: CARDIAC CATHETERIZATION: SHX172

## 2012-01-27 HISTORY — PX: LEFT HEART CATHETERIZATION WITH CORONARY ANGIOGRAM: SHX5451

## 2012-01-27 LAB — GLUCOSE, CAPILLARY
Glucose-Capillary: 121 mg/dL — ABNORMAL HIGH (ref 70–99)
Glucose-Capillary: 119 mg/dL — ABNORMAL HIGH (ref 70–99)

## 2012-01-27 SURGERY — LEFT HEART CATHETERIZATION WITH CORONARY ANGIOGRAM
Anesthesia: LOCAL

## 2012-01-27 MED ORDER — SODIUM CHLORIDE 0.9 % IJ SOLN: 3.0000 mL | INTRAMUSCULAR | Status: DC | PRN

## 2012-01-27 MED ORDER — ASPIRIN EC 81 MG PO TBEC: 81.0000 mg | DELAYED_RELEASE_TABLET | Freq: Every day | ORAL | Status: DC

## 2012-01-27 MED ORDER — ONDANSETRON HCL 4 MG/2ML IJ SOLN: 4.0000 mg | Freq: Four times a day (QID) | INTRAMUSCULAR | Status: DC | PRN

## 2012-01-27 MED ORDER — SODIUM CHLORIDE 0.9 % IV SOLN
INTRAVENOUS | Status: DC
  Administered 2012-01-27: 08:00:00 via INTRAVENOUS

## 2012-01-27 MED ORDER — HEPARIN (PORCINE) IN NACL 2-0.9 UNIT/ML-% IJ SOLN
INTRAMUSCULAR | Status: AC
  Filled 2012-01-27: qty 2000

## 2012-01-27 MED ORDER — SODIUM CHLORIDE 0.9 % IV SOLN: 1.0000 mL/kg/h | INTRAVENOUS | Status: DC

## 2012-01-27 MED ORDER — LIDOCAINE HCL (PF) 1 % IJ SOLN
INTRAMUSCULAR | Status: AC
  Filled 2012-01-27: qty 30

## 2012-01-27 MED ORDER — VITAMIN E 45 MG (100 UNIT) PO CAPS: 100.0000 [IU] | ORAL_CAPSULE | Freq: Every day | ORAL | Status: DC

## 2012-01-27 MED ORDER — FENTANYL CITRATE 0.05 MG/ML IJ SOLN
INTRAMUSCULAR | Status: AC
  Filled 2012-01-27: qty 2

## 2012-01-27 MED ORDER — CITALOPRAM HYDROBROMIDE 20 MG PO TABS: 20.0000 mg | ORAL_TABLET | Freq: Every day | ORAL | Status: DC

## 2012-01-27 MED ORDER — SODIUM CHLORIDE 0.9 % IV SOLN: INTRAVENOUS | Status: DC

## 2012-01-27 MED ORDER — ACETAMINOPHEN 325 MG PO TABS: 650.0000 mg | ORAL_TABLET | ORAL | Status: DC | PRN

## 2012-01-27 MED ORDER — SIMVASTATIN 20 MG PO TABS: 20.0000 mg | ORAL_TABLET | Freq: Every day | ORAL | Status: DC

## 2012-01-27 MED ORDER — NITROGLYCERIN 0.2 MG/ML ON CALL CATH LAB
INTRAVENOUS | Status: AC
  Filled 2012-01-27: qty 1

## 2012-01-27 MED ORDER — LISINOPRIL 20 MG PO TABS: 20.0000 mg | ORAL_TABLET | Freq: Every day | ORAL | Status: DC

## 2012-01-27 MED ORDER — MIDAZOLAM HCL 2 MG/2ML IJ SOLN
INTRAMUSCULAR | Status: AC
  Filled 2012-01-27: qty 2

## 2012-01-27 NOTE — Cardiovascular Report (Signed)
NAME:  [name], Kevin [name] NO.:  [id number]  MEDICAL RECORD NO.:  [id number]  LOCATION:  [facility]                         FACILITY:  [facility]  PHYSICIAN:  Nicki Guadalajara, M.D.     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  The patient has a history of diabetes mellitus.  He denies any chest pain.  An echo Doppler study had showed an ejection fraction of approximately 35% with wall motion abnormalities, consistent of apical dyskinesia, inferior and inferoseptal hypokinesis with mild-to-moderate MR.  a subsequent nuclear perfusion study suggested scar with an ejection fraction of 24% with minimal reversibility defects of the apical, basal, inferior, and mid inferoseptal region.  He now presents for definitive diagnostic cardiac catheterization.  After premedication with Versed 2 mg plus fentanyl 25 mcg, the patient was prepped and draped in the usual fashion.  His right femoral artery was punctured anteriorly, and a 5-French sheath was inserted without difficulty.  Diagnostic catheterization was done utilizing 5-French Judkins 4 left and right coronary catheters.  A 5-French pigtail catheter was used for left ventriculography as well as distal aortography.  Hemostasis was obtained by direct manual pressure.  The patient tolerated the procedure well.  HEMODYNAMIC DATA:  Central aortic pressure was 120/68.  Left ventricular pressure 120/70, post A-wave 15.  ANGIOGRAPHIC DATA:  The left main coronary artery was angiographically normal and bifurcated into an LAD and left circumflex system.  The LAD was angiographically normal and gave rise to 2 small proximal diagonal vessels and then a large  branching diagonal vessel in the mid segment.  In addition, there was a large proximal septal perforating artery.  The LAD and its branches were angiographically normal.  The LAD extended to the apex.  The circumflex vessel was angiographically normal and gave rise to 1 major marginal vessel.  The right coronary artery was a large-caliber normal dominant right coronary artery, that supplied a large PDA system and posterolateral vessel.  There is no significant coronary obstructive disease.  RAO ventriculography revealed a dilated left ventricle.  Ejection fraction was 25%.  There was hypokinesis of the distal anterolateral wall with dyskinesis of the apical wall and hypokinesis inferiorly. Ejection fraction was 25%. Distal aortography revealed widely patent renal arteries.  There was no aneurysmal dilatation.  There was no significant aortoiliac disease.  IMPRESSION: 1. Dilated left ventricle with findings consistent with a nonischemic     cardiomyopathy with hypocontractility of the distal anterolateral     wall, dyskinesis of the apex and inferior hypocontractility with     ejection fraction of     25%. 2. Essentially normal coronary arteries. 3. No significant aortoiliac disease.          ______________________________ Nicki Guadalajara, M.D.     TK/MEDQ  D:  01/27/2012  T:  01/27/2012  Job:  [id number]  cc:   Gerlene Burdock A. Alanda Amass, M.D. [name], MD

## 2012-01-27 NOTE — Brief Op Note (Signed)
01/27/2012  9:49 AM   Cardiac Catheterization  PATIENT:  Kevin [name]  67 y.o. male  PRE-OPERATIVE DIAGNOSIS:  Abnormal nuclear perfusion scan  Full note dictated; see diagram  DICTATION # [id number], [id number]   Tolerated procedure well.  Normal coronary arteries. LV dilated EF 25% with hypokinesis of the distal anterolateral wall and inferior wall, and apical dyskinesis. Nl Aortoiliac system  Findings c/w NICM.  Lennette Bihari, MD, [facility] 01/27/2012 9:50 AM

## 2012-01-27 NOTE — Discharge Instructions (Signed)

## 2012-01-27 NOTE — H&P (Signed)
  Updated H&P:  See complete dictated note by Dr. Alanda Amass from 01/25/2012.  Pt seen and examined today. No change in PEX. Discussed cath procedure with patient and family. Pt is currently asymptomatic with reference to his anal fissure and does not have planned surgery.  Discussed if PCI needed:re type of stent.  Answered all questions. Labs reviewed.  Plan cath this am.

## 2012-01-28 ENCOUNTER — Telehealth (INDEPENDENT_AMBULATORY_CARE_PROVIDER_SITE_OTHER): Payer: Self-pay

## 2012-01-28 NOTE — Telephone Encounter (Signed)
Called patient and left message for him to call back make a follow up appointment after we received cardiac clearance.

## 2012-02-07 ENCOUNTER — Telehealth (INDEPENDENT_AMBULATORY_CARE_PROVIDER_SITE_OTHER): Payer: Self-pay

## 2012-02-07 NOTE — Telephone Encounter (Signed)
Received cardiac clearance. Called patient to see if he wanted to schedule an appointment to see [facility] to possibly reschedule surgery. If patient calls back and wishes to proceed he will need a recheck appointment.

## 2012-03-10 ENCOUNTER — Ambulatory Visit (INDEPENDENT_AMBULATORY_CARE_PROVIDER_SITE_OTHER): Payer: No Typology Code available for payment source | Admitting: Surgery

## 2012-03-10 ENCOUNTER — Encounter (INDEPENDENT_AMBULATORY_CARE_PROVIDER_SITE_OTHER): Payer: Self-pay | Admitting: Surgery

## 2012-03-10 VITALS — BP 132/80 | HR 72 | Temp 97.8°F | Resp 14 | Ht 75.0 in | Wt 203.0 lb

## 2012-03-10 DIAGNOSIS — K603 Anal fistula, unspecified: Secondary | ICD-10-CM

## 2012-03-10 NOTE — Patient Instructions (Signed)
We will schedule you for surgery.

## 2012-03-10 NOTE — Progress Notes (Signed)
Patient ID: Kevin [name], male   DOB: 03/18/1946, 66 y.o.   MRN: [id number]  Chief Complaint  Patient presents with  . Appointment    Fistula    HPI Kevin [name] is a 66 y.o. male. HPI The patient returns in followup for recurrent perirectal abscesses. He's had drainage from his left anal canal.. No fevers or chills. The drainage is stopped. He was schedule for EUA but this was cancelled due to a dysrhythmia.  This has been corrected.  He is ready to reschedule surgery. Past Medical History  Diagnosis Date  . Diabetes mellitus   . Chronic kidney disease     stones  . UTI (urinary tract infection)   . Peri-rectal abscess   . Sinus bradycardia   . Anemia   . [place]     [place]    Past Surgical History  Procedure Date  . Insert / replace / remove pacemaker 12/01/11    initial placement  . Inguinal hernia repair 1980    left  . Tonsillectomy   . Shrapnel surgery     "in [place]"    Family History  Problem Relation Age of Onset  . Cancer Mother     ovarian  . Leukemia Father     Social History History  Substance Use Topics  . Smoking status: Former Smoker -- 1.0 packs/day for 20 years    Types: Cigarettes    Quit date: 04/13/1984  . Smokeless tobacco: Never Used  . Alcohol Use: Yes     12/01/11 "maybe 3 beers or glasses of wine /month"    Allergies  Allergen Reactions  . Sulfa Antibiotics     Swelling of lips    Current Outpatient Prescriptions  Medication Sig Dispense Refill  . aspirin 81 MG tablet Take 81 mg by mouth daily.        . carvedilol (COREG) 6.25 MG tablet Take 6.25 mg by mouth 2 (two) times daily with a meal.      . citalopram (CELEXA) 20 MG tablet Take 20 mg by mouth daily.        . lisinopril (PRINIVIL,ZESTRIL) 40 MG tablet Take 20 mg by mouth daily.       . Loratadine 10 MG CAPS Take 10 mg by mouth daily.       . LORazepam (ATIVAN) 0.5 MG tablet Take 0.5 mg by mouth daily as needed. anxiety      . metFORMIN  (GLUCOPHAGE) 1000 MG tablet Take 1,000 mg by mouth 2 (two) times daily with a meal.        . Saw Palmetto, Serenoa repens, 1000 MG CAPS Take by mouth daily.        . simvastatin (ZOCOR) 40 MG tablet Take 20 mg by mouth at bedtime.       . vitamin E 100 UNIT capsule Take 100 Units by mouth daily.        Review of Systems Review of Systems  Constitutional: Negative for fever, chills and unexpected weight change.  HENT: Negative for hearing loss, congestion, sore throat, trouble swallowing and voice change.   Eyes: Negative for visual disturbance.  Respiratory: Negative for cough and wheezing.   Cardiovascular: Negative for chest pain, palpitations and leg swelling.  Gastrointestinal: Negative for nausea, vomiting, abdominal pain, diarrhea, constipation, blood in stool, abdominal distention, anal bleeding and rectal pain.  Genitourinary: Negative for hematuria and difficulty urinating.  Musculoskeletal: Negative for arthralgias.  Skin: Negative for rash and wound.    Neurological: Negative for seizures, syncope, weakness and headaches.  Hematological: Negative for adenopathy. Does not bruise/bleed easily.  Psychiatric/Behavioral: Negative for confusion.    Blood pressure 132/80, pulse 72, temperature 97.8 F (36.6 C), temperature source Temporal, resp. rate 14, height 6' 3" (1.905 m), weight 203 lb (92.08 kg).  Physical Exam Physical Exam  Constitutional: He appears well-developed and well-nourished.  HENT:  Head: Normocephalic and atraumatic.  Eyes: EOM are normal. Pupils are equal, round, and reactive to light.  Neck: Normal range of motion.  Cardiovascular: Normal rate and regular rhythm.   Pulmonary/Chest: Effort normal and breath sounds normal.  Abdominal: Soft. Bowel sounds are normal.  Genitourinary:       Fistula tract there is left lateral anal canal about 2 cm in the anal verge with mild erythema. No evidence of abscess.    Data Reviewed   Assessment    Fistula in  ano    Plan    I discussed medical and surgical options of treatment. Exam under anesthesia with possible fistulotomy recommended. I discussed the use of a fistula plug and a seton. Risk of bleeding, infection, fecal incontinence, pelvic sepsis, exacerbation medical problems, DVT, and cardiovascular complications of the procedure. If this fails, he may require an advancement flap. He wishes to proceed.       Amond Speranza A. 03/10/2012, 9:33 AM    

## 2012-03-14 ENCOUNTER — Ambulatory Visit (INDEPENDENT_AMBULATORY_CARE_PROVIDER_SITE_OTHER): Payer: No Typology Code available for payment source | Admitting: Family Medicine

## 2012-03-14 ENCOUNTER — Encounter: Payer: Self-pay | Admitting: Family Medicine

## 2012-03-14 VITALS — BP 130/82 | Temp 97.7°F | Wt 207.0 lb

## 2012-03-14 DIAGNOSIS — R0781 Pleurodynia: Secondary | ICD-10-CM

## 2012-03-14 DIAGNOSIS — R071 Chest pain on breathing: Secondary | ICD-10-CM

## 2012-03-14 NOTE — Progress Notes (Addendum)
  Subjective:    Patient ID: Kevin [name], male    DOB: 1946/08/28, 66 y.o.   MRN: [id number]  HPI  Presents with some discomfort with breathing with onset Saturday. Gradual onset. He noticed some pain with deep breathing bilateral Saturday this is actually better and abated yesterday. Recurrence again today mostly left lung. He had some soreness to touch left trapezius area. No rash. Tylenol helps. Denies any cough. No fever or chills. No dyspnea with activity or rest. He exercised with fast walking yesterday without any difficulty. No hemoptysis.  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 Denies any recent chest pain.  Past Medical History  Diagnosis Date  . Diabetes mellitus   . Chronic kidney disease     stones  . UTI (urinary tract infection)   . Peri-rectal abscess   . Sinus bradycardia   . Anemia   . Blood transfusion     [place]   Past Surgical History  Procedure Date  . Insert / replace / remove pacemaker 12/01/11    initial placement  . Inguinal hernia repair 1980    left  . Tonsillectomy   . Shrapnel surgery     "in [place]"    reports that he quit smoking about 27 years ago. His smoking use included Cigarettes. He has a 20 pack-year smoking history. He has never used smokeless tobacco. He reports that he drinks alcohol. He reports that he does not use illicit drugs. family history includes Cancer in his mother and Leukemia in his father. Allergies  Allergen Reactions  . Sulfa Antibiotics     Swelling of lips      Review of Systems  Constitutional: Negative for fever, chills and fatigue.  Respiratory: Negative for cough, shortness of breath and wheezing.   Cardiovascular: Negative for palpitations and leg swelling.  Gastrointestinal: Negative for abdominal pain.  Neurological: Negative for dizziness and syncope.       Objective:   Physical Exam  Constitutional: He is oriented to person, place,  and time. He appears well-developed and well-nourished.  Neck: Neck supple. No thyromegaly present.  Cardiovascular: Normal rate and regular rhythm.   Pulmonary/Chest: Effort normal and breath sounds normal. No respiratory distress. He has no wheezes. He has no rales.  Musculoskeletal: He exhibits no edema.  Lymphadenopathy:    He has no cervical adenopathy.  Neurological: He is alert and oriented to person, place, and time.          Assessment & Plan:  Mild pleuritic pain. ?musculoskeletal. No evidence for hypoxemia. Nonfocal exam. No evidence for respiratory infection. Very low likelihood for pulmonary embolus with no evidence for hypoxemia, leg pain, or any clear risk factors. Check d-dimer. If normal observe. If elevated CT angiogram.  D-dimer slightly elevated. Proceed with CT angiogram chest

## 2012-03-14 NOTE — Patient Instructions (Signed)
Follow up promptly for any shortness or breath, fever, or any progressive chest pain

## 2012-03-15 ENCOUNTER — Ambulatory Visit
Admission: RE | Admit: 2012-03-15 | Discharge: 2012-03-15 | Disposition: A | Discharge status: Home / Self Care | Payer: No Typology Code available for payment source | Source: Ambulatory Visit | Attending: Family Medicine | Admitting: Family Medicine

## 2012-03-15 DIAGNOSIS — R0781 Pleurodynia: Secondary | ICD-10-CM

## 2012-03-15 MED ORDER — IOHEXOL 350 MG/ML SOLN
125.0000 mL | Freq: Once | INTRAVENOUS | Status: AC | PRN
  Administered 2012-03-15: 125 mL via INTRAVENOUS

## 2012-03-15 NOTE — Progress Notes (Signed)
Addended by: Kristian Covey on: 03/15/2012 04:39 PM   Modules accepted: Orders

## 2012-03-15 NOTE — Progress Notes (Signed)
Quick Note:  Pt informed and he will wait to hear back from Terri, our scheduling coordinator. Please call pt cell with info. ______

## 2012-03-16 ENCOUNTER — Telehealth: Payer: Self-pay | Admitting: Family Medicine

## 2012-03-16 ENCOUNTER — Telehealth: Payer: Self-pay | Admitting: Speech Pathology

## 2012-03-16 NOTE — Telephone Encounter (Signed)
Called and left message.

## 2012-03-16 NOTE — Telephone Encounter (Signed)
[address] [place], [place] [address] p. [phone] f. [phone] To: [facility] Fax: [phone] From: [facility] Date/ Time: 03/15/2012 7:04 PM Taken By: Ulice Bold, CSR Caller: Fulton Mole Facility: [facility] Patient: Kevin [name], Kevin [name] DOB: [birth year] Phone: [phone] Reason for Call: Fulton Mole is calling with results for CT Scan with contrast ordered by Dr Caryl Never. The results are ct angio with contrast and were read by Dr Carlota Raspberry.

## 2012-03-16 NOTE — Progress Notes (Signed)
Quick Note:  Pt has requested Dr Caryl Never call to discuss CT scan ______

## 2012-03-16 NOTE — Telephone Encounter (Signed)
This patient called and asked me who ordered his CTA. I explained that the order came from Dr. Caryl Never. He seemed upset that he had to have this done and is having to pay a lot of money for it.  He would like you to call him to discuss this. Thank you.

## 2012-03-16 NOTE — Telephone Encounter (Signed)
Pt returned call. Pls call at earliest convenience.

## 2012-03-20 NOTE — Telephone Encounter (Signed)
I called again and left message with pt last week.

## 2012-03-28 ENCOUNTER — Encounter (HOSPITAL_COMMUNITY): Payer: Self-pay

## 2012-03-28 ENCOUNTER — Inpatient Hospital Stay (HOSPITAL_COMMUNITY)
Admission: RE | Admit: 2012-03-28 | Discharge: 2012-03-28 | Payer: No Typology Code available for payment source | Source: Ambulatory Visit

## 2012-03-28 HISTORY — DX: Presence of cardiac pacemaker: Z95.0

## 2012-03-28 NOTE — Pre-Procedure Instructions (Signed)
20 RORAN WEGNER  03/28/2012   Your procedure is scheduled on:  04/06/12  Report to [facility] at 5:30 AM.  Call this number if you have problems the morning of surgery: [phone]   Remember: Discontinue Aspirin, coumadin, Plavix, Effient and herbal medications.   Do not eat food:After Midnight.  May have clear liquids: up to 4 Hours before arrival (1:30 AM).  Clear liquids include soda, tea, black coffee, apple or grape juice, broth.  Take these medicines the morning of surgery with A SIP OF WATER: Coreg, Celexa, Loratidine   Do not wear jewelry, make-up or nail polish.  Do not wear lotions, powders, or perfumes. You may wear deodorant.  Do not shave 48 hours prior to surgery. Men may shave face and neck.  Do not bring valuables to the hospital.  Contacts, dentures or bridgework may not be worn into surgery.  Leave suitcase in the car. After surgery it may be brought to your room.  For patients admitted to the hospital, checkout time is 11:00 AM the day of discharge.   Patients discharged the day of surgery will not be allowed to drive home.  Name and phone number of your driver: Darel Hong [phone]  Special Instructions: CHG Shower Use Special Wash: 1/2 bottle night before surgery and 1/2 bottle morning of surgery.   Please read over the following fact sheets that you were given: Pain Booklet, Coughing and Deep Breathing, MRSA Information and Surgical Site Infection Prevention

## 2012-03-28 NOTE — Progress Notes (Signed)
Note to [place] to req from [facility] most recent EKG, notes, stress and ECHO patient states was done 2013.   Cardiac Device form faxed to [facility] by Dewayne Hatch and received confirmation fax received. Notified Leta Jungling with [employer] about pacemaker.

## 2012-03-30 ENCOUNTER — Encounter (HOSPITAL_COMMUNITY): Payer: Self-pay | Admitting: Vascular Surgery

## 2012-03-30 NOTE — Consult Note (Addendum)
Anesthesia Chart Review:  Patient is a 66 year old male posted for exam under anesthesia with possible fistulotomy on 04/06/12 for recurrent perirectal abscesses.  He was previously scheduled, but was canceled due to dysrhythmia (2nd degree AVB, Mobitz II with alternating left and right BBB).   A [employer] dual chamber PPM was placed on 12/01/11.  He underwent additional cardiac testing afterwards and was found to have an abnormal Myoview and echo on 01/21/12 (see below).  Cardiac cath showed findings consistent with non-ischemic CM.    Other history includes DM2, kidney stones, anemia, blood transfusion.  PCP is Dr. Edwin Cap.   His Cardiologist is Dr. Alanda Amass.  His last visit was on 02/07/12.  His EKG there on 01/25/12 showed what appears to be left BBB (pacer induced?).  According to his last note, patient has been asymptomatic, so continued medical therapy is being recommended.  His meds include Coreg and lisinopril.  Echo on 01/21/12 showed mildly dilated, mildly hypertrophic LV with moderately depressed systolic function, estimated EF 354-40%, multiple regional wall motion abnormalities, apical dyskinesis, moderated inferior and inferoseptal hypokinesis, mild diastolic dysfunction, normal mean LA pressure, mildly dilated LA, mild mitral annular calcification, mild to moderate mitral regurgitation.  Stress test on 01/21/12 showed EF 24%, minimal reversibility of perfusion defects.  He subsequently had a cardiac cath on 01/27/12 that showed: 1. Dilated left ventricle with findings consistent with a nonischemic cardiomyopathy with hypocontractility of the distal anterolateral wall, dyskinesis of the apex and inferior hypocontractility with ejection fraction of 25%.  2. Essentially normal coronary arteries.  3. No significant aortoiliac disease.  CXR on 12/02/11 showed: Pacemaker leads are intact and in good position. No pneumothorax or pulmonary edema.  He is for labs on 04/05/12.    Dr. Alanda Amass  is in the office today, 03/30/12.  I asked his staff to inform him of the planned procedure.  I received word from Dr.Weintraub's nurse that he feels patient is okay for surgery.  Shonna Chock, PA-C

## 2012-04-05 ENCOUNTER — Encounter (HOSPITAL_COMMUNITY)
Admission: RE | Admit: 2012-04-05 | Discharge: 2012-04-05 | Disposition: A | Discharge status: Home / Self Care | Payer: No Typology Code available for payment source | Source: Ambulatory Visit | Attending: Surgery | Admitting: Surgery

## 2012-04-05 LAB — CBC
HCT: 44.7 % (ref 39.0–52.0)
Hemoglobin: 15.2 g/dL (ref 13.0–17.0)
MCV: 97.2 fL (ref 78.0–100.0)
RBC: 4.6 MIL/uL (ref 4.22–5.81)
RDW: 12.9 % (ref 11.5–15.5)
WBC: 6.6 10*3/uL (ref 4.0–10.5)

## 2012-04-05 LAB — DIFFERENTIAL
Basophils Absolute: 0 10*3/uL (ref 0.0–0.1)
Basophils Relative: 1 % (ref 0–1)
Eosinophils Absolute: 0.3 10*3/uL (ref 0.0–0.7)
Neutro Abs: 4.3 10*3/uL (ref 1.7–7.7)
Neutrophils Relative %: 65 % (ref 43–77)

## 2012-04-05 LAB — COMPREHENSIVE METABOLIC PANEL
Alkaline Phosphatase: 68 U/L (ref 39–117)
BUN: 13 mg/dL (ref 6–23)
CO2: 28 mEq/L (ref 19–32)
Chloride: 103 mEq/L (ref 96–112)
Creatinine, Ser: 0.79 mg/dL (ref 0.50–1.35)
GFR calc Af Amer: >90 mL/min (ref >90)
GFR calc non Af Amer: >90 mL/min (ref >90)
Glucose, Bld: 150 mg/dL — ABNORMAL HIGH (ref 70–99)
Potassium: 5.3 mEq/L — ABNORMAL HIGH (ref 3.5–5.1)
Total Bilirubin: 0.5 mg/dL (ref 0.3–1.2)

## 2012-04-05 LAB — SURGICAL PCR SCREEN: Staphylococcus aureus: NEGATIVE

## 2012-04-05 MED ORDER — CEFAZOLIN SODIUM-DEXTROSE 2-3 GM-% IV SOLR
2.0000 g | INTRAVENOUS | Status: AC
  Administered 2012-04-06: 2 g via INTRAVENOUS
  Filled 2012-04-05: qty 50

## 2012-04-06 ENCOUNTER — Encounter (HOSPITAL_COMMUNITY): Payer: Self-pay | Admitting: *Deleted

## 2012-04-06 ENCOUNTER — Encounter (HOSPITAL_COMMUNITY): Payer: Self-pay | Admitting: Vascular Surgery

## 2012-04-06 ENCOUNTER — Ambulatory Visit (HOSPITAL_COMMUNITY): Payer: No Typology Code available for payment source | Admitting: Vascular Surgery

## 2012-04-06 ENCOUNTER — Encounter (HOSPITAL_COMMUNITY)
Admission: RE | Disposition: A | Discharge status: Home / Self Care | Payer: Self-pay | Source: Ambulatory Visit | Attending: Surgery

## 2012-04-06 ENCOUNTER — Ambulatory Visit (HOSPITAL_COMMUNITY)
Admission: RE | Admit: 2012-04-06 | Discharge: 2012-04-06 | Disposition: A | Discharge status: Home / Self Care | Payer: No Typology Code available for payment source | Source: Ambulatory Visit | Attending: Surgery | Admitting: Surgery

## 2012-04-06 DIAGNOSIS — F419 Anxiety disorder, unspecified: Secondary | ICD-10-CM

## 2012-04-06 DIAGNOSIS — K649 Unspecified hemorrhoids: Secondary | ICD-10-CM

## 2012-04-06 DIAGNOSIS — E119 Type 2 diabetes mellitus without complications: Secondary | ICD-10-CM | POA: Insufficient documentation

## 2012-04-06 DIAGNOSIS — K612 Anorectal abscess: Secondary | ICD-10-CM | POA: Insufficient documentation

## 2012-04-06 DIAGNOSIS — K603 Anal fistula: Secondary | ICD-10-CM

## 2012-04-06 DIAGNOSIS — G4700 Insomnia, unspecified: Secondary | ICD-10-CM

## 2012-04-06 HISTORY — PX: EXAMINATION UNDER ANESTHESIA: SHX1540

## 2012-04-06 LAB — GLUCOSE, CAPILLARY: Glucose-Capillary: 134 mg/dL — ABNORMAL HIGH (ref 70–99)

## 2012-04-06 SURGERY — EXAM UNDER ANESTHESIA
Anesthesia: General | Site: Anus | Wound class: Clean Contaminated

## 2012-04-06 MED ORDER — LIDOCAINE HCL 2 % EX GEL: CUTANEOUS | Status: AC | PRN

## 2012-04-06 MED ORDER — MEPERIDINE HCL 25 MG/ML IJ SOLN: 6.2500 mg | INTRAMUSCULAR | Status: DC | PRN

## 2012-04-06 MED ORDER — LACTATED RINGERS IV SOLN: INTRAVENOUS | Status: DC

## 2012-04-06 MED ORDER — LIDOCAINE HCL (CARDIAC) 20 MG/ML IV SOLN
INTRAVENOUS | Status: DC | PRN
  Administered 2012-04-06: 100 mg via INTRAVENOUS

## 2012-04-06 MED ORDER — OXYCODONE-ACETAMINOPHEN 5-325 MG PO TABS: 1.0000 | ORAL_TABLET | ORAL | Status: AC | PRN

## 2012-04-06 MED ORDER — ONDANSETRON HCL 4 MG/2ML IJ SOLN
4.0000 mg | Freq: Four times a day (QID) | INTRAMUSCULAR | Status: DC | PRN
  Filled 2012-04-06: qty 2

## 2012-04-06 MED ORDER — CARVEDILOL 6.25 MG PO TABS
6.2500 mg | ORAL_TABLET | Freq: Two times a day (BID) | ORAL | Status: AC
  Administered 2012-04-06: 6.25 mg via ORAL
  Filled 2012-04-06: qty 1

## 2012-04-06 MED ORDER — EPHEDRINE SULFATE 50 MG/ML IJ SOLN
INTRAMUSCULAR | Status: DC | PRN
  Administered 2012-04-06: 7.5 mg via INTRAVENOUS

## 2012-04-06 MED ORDER — BUPIVACAINE-EPINEPHRINE 0.25% -1:200000 IJ SOLN
INTRAMUSCULAR | Status: DC | PRN
  Administered 2012-04-06: 6 mL

## 2012-04-06 MED ORDER — 0.9 % SODIUM CHLORIDE (POUR BTL) OPTIME
TOPICAL | Status: DC | PRN
  Administered 2012-04-06: 1000 mL

## 2012-04-06 MED ORDER — PROPOFOL 10 MG/ML IV EMUL
INTRAVENOUS | Status: DC | PRN
  Administered 2012-04-06: 200 mg via INTRAVENOUS

## 2012-04-06 MED ORDER — MIDAZOLAM HCL 5 MG/5ML IJ SOLN
INTRAMUSCULAR | Status: DC | PRN
  Administered 2012-04-06: 2 mg via INTRAVENOUS

## 2012-04-06 MED ORDER — SURGILUBE EX GEL
CUTANEOUS | Status: DC | PRN
  Administered 2012-04-06: 1 via TOPICAL

## 2012-04-06 MED ORDER — ONDANSETRON HCL 4 MG/2ML IJ SOLN
INTRAMUSCULAR | Status: DC | PRN
  Administered 2012-04-06: 4 mg via INTRAVENOUS

## 2012-04-06 MED ORDER — HYDROMORPHONE HCL PF 1 MG/ML IJ SOLN: 0.2500 mg | INTRAMUSCULAR | Status: DC | PRN

## 2012-04-06 MED ORDER — PROMETHAZINE HCL 25 MG/ML IJ SOLN: 6.2500 mg | INTRAMUSCULAR | Status: DC | PRN

## 2012-04-06 MED ORDER — LACTATED RINGERS IV SOLN
INTRAVENOUS | Status: DC | PRN
  Administered 2012-04-06 (×2): via INTRAVENOUS

## 2012-04-06 MED ORDER — FENTANYL CITRATE 0.05 MG/ML IJ SOLN
INTRAMUSCULAR | Status: DC | PRN
  Administered 2012-04-06 (×2): 50 ug via INTRAVENOUS

## 2012-04-06 MED ORDER — MORPHINE SULFATE 2 MG/ML IJ SOLN
1.0000 mg | INTRAMUSCULAR | Status: DC | PRN
  Filled 2012-04-06: qty 1

## 2012-04-06 SURGICAL SUPPLY — 42 items
BLADE SURG 15 STRL LF DISP TIS (BLADE) ×1 IMPLANT
BLADE SURG 15 STRL SS (BLADE) ×2
CANISTER SUCTION 2500CC (MISCELLANEOUS) ×2 IMPLANT
CLOTH BEACON ORANGE TIMEOUT ST (SAFETY) ×2 IMPLANT
COVER SURGICAL LIGHT HANDLE (MISCELLANEOUS) ×2 IMPLANT
DECANTER SPIKE VIAL GLASS SM (MISCELLANEOUS) ×1 IMPLANT
DRAPE PROXIMA HALF (DRAPES) ×2 IMPLANT
DRAPE UTILITY 15X26 W/TAPE STR (DRAPE) ×1 IMPLANT
DRSG PAD ABDOMINAL 8X10 ST (GAUZE/BANDAGES/DRESSINGS) ×2 IMPLANT
ELECT CAUTERY BLADE 6.4 (BLADE) ×2 IMPLANT
ELECT REM PT RETURN 9FT ADLT (ELECTROSURGICAL) ×2
ELECTRODE REM PT RTRN 9FT ADLT (ELECTROSURGICAL) ×1 IMPLANT
GAUZE SPONGE 4X4 16PLY XRAY LF (GAUZE/BANDAGES/DRESSINGS) ×2 IMPLANT
GLOVE BIO SURGEON STRL SZ7.5 (GLOVE) ×1 IMPLANT
GLOVE BIO SURGEON STRL SZ8 (GLOVE) ×2 IMPLANT
GLOVE BIOGEL PI IND STRL 8 (GLOVE) ×1 IMPLANT
GLOVE BIOGEL PI INDICATOR 8 (GLOVE) ×1
GLOVE ECLIPSE 7.5 STRL STRAW (GLOVE) ×1 IMPLANT
GOWN STRL NON-REIN LRG LVL3 (GOWN DISPOSABLE) ×2 IMPLANT
KIT BASIN OR (CUSTOM PROCEDURE TRAY) ×2 IMPLANT
KIT ROOM TURNOVER OR (KITS) ×2 IMPLANT
NDL HYPO 25GX1X1/2 BEV (NEEDLE) ×1 IMPLANT
NEEDLE HYPO 25GX1X1/2 BEV (NEEDLE) ×2 IMPLANT
NS IRRIG 1000ML POUR BTL (IV SOLUTION) ×2 IMPLANT
PACK LITHOTOMY IV (CUSTOM PROCEDURE TRAY) ×2 IMPLANT
PAD ARMBOARD 7.5X6 YLW CONV (MISCELLANEOUS) ×2 IMPLANT
PENCIL BUTTON HOLSTER BLD 10FT (ELECTRODE) ×2 IMPLANT
SPECIMEN JAR SMALL (MISCELLANEOUS) IMPLANT
SPONGE GAUZE 4X4 12PLY (GAUZE/BANDAGES/DRESSINGS) ×2 IMPLANT
SPONGE SURGIFOAM ABS GEL 100 (HEMOSTASIS) IMPLANT
SURGILUBE 2OZ TUBE FLIPTOP (MISCELLANEOUS) ×2 IMPLANT
SUT CHROMIC 2 0 SH (SUTURE) ×2 IMPLANT
SUT MON AB 3-0 SH 27 (SUTURE)
SUT MON AB 3-0 SH27 (SUTURE) IMPLANT
SYR CONTROL 10ML LL (SYRINGE) ×2 IMPLANT
TAPE CLOTH SURG 4X10 WHT LF (GAUZE/BANDAGES/DRESSINGS) ×1 IMPLANT
TOWEL OR 17X24 6PK STRL BLUE (TOWEL DISPOSABLE) ×2 IMPLANT
TOWEL OR 17X26 10 PK STRL BLUE (TOWEL DISPOSABLE) ×2 IMPLANT
TRAY PROCTOSCOPIC FIBER OPTIC (SET/KITS/TRAYS/PACK) IMPLANT
TUBE CONNECTING 12X1/4 (SUCTIONS) ×2 IMPLANT
UNDERPAD 30X30 INCONTINENT (UNDERPADS AND DIAPERS) ×2 IMPLANT
YANKAUER SUCT BULB TIP NO VENT (SUCTIONS) ×2 IMPLANT

## 2012-04-06 NOTE — H&P (View-Only) (Signed)
Patient ID: Kevin [name], male   DOB: 1946/04/29, 66 y.o.   MRN: [id number]  Chief Complaint  Patient presents with  . Appointment    Fistula    HPI Kevin [name] is a 66 y.o. male. HPI The patient returns in followup for recurrent perirectal abscesses. He's had drainage from his left anal canal.. No fevers or chills. The drainage is stopped. He was schedule for EUA but this was cancelled due to a dysrhythmia.  This has been corrected.  He is ready to reschedule surgery. Past Medical History  Diagnosis Date  . Diabetes mellitus   . Chronic kidney disease     stones  . UTI (urinary tract infection)   . Peri-rectal abscess   . Sinus bradycardia   . Anemia   . Blood transfusion     [place]    Past Surgical History  Procedure Date  . Insert / replace / remove pacemaker 12/01/11    initial placement  . Inguinal hernia repair 1980    left  . Tonsillectomy   . Shrapnel surgery     "in [place]"    Family History  Problem Relation Age of Onset  . Cancer Mother     ovarian  . Leukemia Father     Social History History  Substance Use Topics  . Smoking status: Former Smoker -- 1.0 packs/day for 20 years    Types: Cigarettes    Quit date: 04/13/1984  . Smokeless tobacco: Never Used  . Alcohol Use: Yes     12/01/11 "maybe 3 beers or glasses of wine /month"    Allergies  Allergen Reactions  . Sulfa Antibiotics     Swelling of lips    Current Outpatient Prescriptions  Medication Sig Dispense Refill  . aspirin 81 MG tablet Take 81 mg by mouth daily.        . carvedilol (COREG) 6.25 MG tablet Take 6.25 mg by mouth 2 (two) times daily with a meal.      . citalopram (CELEXA) 20 MG tablet Take 20 mg by mouth daily.        Marland Kitchen lisinopril (PRINIVIL,ZESTRIL) 40 MG tablet Take 20 mg by mouth daily.       . Loratadine 10 MG CAPS Take 10 mg by mouth daily.       Marland Kitchen LORazepam (ATIVAN) 0.5 MG tablet Take 0.5 mg by mouth daily as needed. anxiety      . metFORMIN  (GLUCOPHAGE) 1000 MG tablet Take 1,000 mg by mouth 2 (two) times daily with a meal.        . Saw Palmetto, Serenoa repens, 1000 MG CAPS Take by mouth daily.        . simvastatin (ZOCOR) 40 MG tablet Take 20 mg by mouth at bedtime.       . vitamin E 100 UNIT capsule Take 100 Units by mouth daily.        Review of Systems Review of Systems  Constitutional: Negative for fever, chills and unexpected weight change.  HENT: Negative for hearing loss, congestion, sore throat, trouble swallowing and voice change.   Eyes: Negative for visual disturbance.  Respiratory: Negative for cough and wheezing.   Cardiovascular: Negative for chest pain, palpitations and leg swelling.  Gastrointestinal: Negative for nausea, vomiting, abdominal pain, diarrhea, constipation, blood in stool, abdominal distention, anal bleeding and rectal pain.  Genitourinary: Negative for hematuria and difficulty urinating.  Musculoskeletal: Negative for arthralgias.  Skin: Negative for rash and wound.  Neurological: Negative for seizures, syncope, weakness and headaches.  Hematological: Negative for adenopathy. Does not bruise/bleed easily.  Psychiatric/Behavioral: Negative for confusion.    Blood pressure 132/80, pulse 72, temperature 97.8 F (36.6 C), temperature source Temporal, resp. rate 14, height 6\' 3"  (1.905 m), weight 203 lb (92.08 kg).  Physical Exam Physical Exam  Constitutional: He appears well-developed and well-nourished.  HENT:  Head: Normocephalic and atraumatic.  Eyes: EOM are normal. Pupils are equal, round, and reactive to light.  Neck: Normal range of motion.  Cardiovascular: Normal rate and regular rhythm.   Pulmonary/Chest: Effort normal and breath sounds normal.  Abdominal: Soft. Bowel sounds are normal.  Genitourinary:       Fistula tract there is left lateral anal canal about 2 cm in the anal verge with mild erythema. No evidence of abscess.    Data Reviewed   Assessment    Fistula in  ano    Plan    I discussed medical and surgical options of treatment. Exam under anesthesia with possible fistulotomy recommended. I discussed the use of a fistula plug and a seton. Risk of bleeding, infection, fecal incontinence, pelvic sepsis, exacerbation medical problems, DVT, and cardiovascular complications of the procedure. If this fails, he may require an advancement flap. He wishes to proceed.       Jamieka Royle A. 03/10/2012, 9:33 AM

## 2012-04-06 NOTE — Anesthesia Postprocedure Evaluation (Signed)
  Anesthesia Post-op Note  Patient: Kevin [name]  Procedure(s) Performed: Procedure(s) (LRB): EXAM UNDER ANESTHESIA (N/A)  Patient Location: PACU  Anesthesia Type: General  Level of Consciousness: awake and alert   Airway and Oxygen Therapy: Patient Spontanous Breathing  Post-op Pain: mild  Post-op Assessment: Post-op Vital signs reviewed, Patient's Cardiovascular Status Stable, Respiratory Function Stable, Patent Airway and No signs of Nausea or vomiting  Post-op Vital Signs: stable  Complications: No apparent anesthesia complications

## 2012-04-06 NOTE — Preoperative (Signed)
Beta Blockers   Reason not to administer Beta Blockers:Not Applicable 

## 2012-04-06 NOTE — Op Note (Signed)
Preoperative diagnosis: History of recurrent perirectal abscess  Postop diagnosis: Same  Procedure: Exam under anesthesia  Surgeon: Harriette Bouillon M.D.  Anesthesia: LMA with 0.25% Sensorcaine local  EBL: Minimal  Specimen: None  Indications for procedure: The patient presents due to recurrent perirectal abscess and concern for possible fistula in ano. Risks, benefits and per therapies discussed. He wishes to proceed.  Description of procedure: The patient was seen in the holding area and questions are answered. He is taken back to the operating room and placed supine. General anesthesia initiated and he was placed in lithotomy. Timeout was done. Perianal region prepped and draped in sterile fashion. Digital examination showed normal time without mass. Anoscopy is performed. Mild grade 2 hemorrhoid disease noted. The patient is scar in the left lateral posterior region at 5:00. This felt mildly fluctuant but not read. Scalpel was used to make a small incision this area in a probe was used to see if there is a tract. No tract was identified. The cavity was cauterized and left open. No evidence of fissure. Local anesthesia was injected around the wound. Dry dressings were then applied. All final counts are found to be correct. Of note there is no evidence of internal fistula opening at the dentate line. The patient was taken lithotomy extubated and taken recovery in satisfactory condition. All final counts are correct.

## 2012-04-06 NOTE — Anesthesia Preprocedure Evaluation (Addendum)
Anesthesia Evaluation  Patient identified by MRN, date of birth, ID band Patient awake    Reviewed: Allergy & Precautions, H&P , NPO status , Patient's Chart, lab work & pertinent test results  Airway Mallampati: II TM Distance: >3 FB Neck ROM: Full    Dental No notable dental hx.    Pulmonary neg pulmonary ROS, former smoker breath sounds clear to auscultation  Pulmonary exam normal       Cardiovascular negative cardio ROS  + dysrhythmias + pacemaker (for brady) Rhythm:Regular Rate:Normal  Nonischemic cardiomyopathy   Neuro/Psych negative neurological ROS  negative psych ROS   GI/Hepatic negative GI ROS, Neg liver ROS,   Endo/Other  negative endocrine ROSDiabetes mellitus-, Type 2, Oral Hypoglycemic Agents  Renal/GU negative Renal ROS  negative genitourinary   Musculoskeletal negative musculoskeletal ROS (+)   Abdominal   Peds negative pediatric ROS (+)  Hematology negative hematology ROS (+)   Anesthesia Other Findings Front teeth crown.  Reproductive/Obstetrics negative OB ROS                          Anesthesia Physical Anesthesia Plan  ASA: II  Anesthesia Plan: General   Post-op Pain Management:    Induction: Intravenous  Airway Management Planned: Oral ETT and LMA  Additional Equipment:   Intra-op Plan:   Post-operative Plan:   Informed Consent: I have reviewed the patients History and Physical, chart, labs and discussed the procedure including the risks, benefits and alternatives for the proposed anesthesia with the patient or authorized representative who has indicated his/her understanding and acceptance.   Dental advisory given  Plan Discussed with: CRNA and Surgeon  Anesthesia Plan Comments:         Anesthesia Quick Evaluation

## 2012-04-06 NOTE — Discharge Instructions (Signed)
[facility] _______[facility] ° °RECTAL SURGERY POST OP INSTRUCTIONS: POST OP INSTRUCTIONS ° °Always review your discharge instruction sheet given to you by the facility where your surgery was performed. °IF YOU HAVE DISABILITY OR FAMILY LEAVE FORMS, YOU MUST BRING THEM TO THE OFFICE FOR PROCESSING.   °DO NOT GIVE THEM TO YOUR DOCTOR. ° °1. A  prescription for pain medication may be given to you upon discharge.  Take your pain medication as prescribed, if needed.  If narcotic pain medicine is not needed, then you may take acetaminophen (Tylenol) or ibuprofen (Advil) as needed. °2. Take your usually prescribed medications unless otherwise directed. °3. If you need a refill on your pain medication, please contact your pharmacy.  They will contact our office to request authorization. Prescriptions will not be filled after 5 pm or on week-ends. °4. You should follow a light diet the first 48 hours after arrival home, such as soup and crackers, etc.  Be sure to include lots of fluids daily.  Resume your normal diet 2-3 days after surgery.. °5. Most patients will experience some swelling and discomfort in the rectal area. Ice packs, reclining and warm tub soaks will help.  Swelling and discomfort can take several days to resolve.  °6. It is common to experience some constipation if taking pain medication after surgery.  Increasing fluid intake and taking a stool softener (such as Colace) will usually help or prevent this problem from occurring.  A mild laxative (Milk of Magnesia or Miralax) should be taken according to package directions if there are no bowel movements after 48 hours. °7. Unless discharge instructions indicate otherwise, leave your bandage dry and in place for 24 hours, or remove the bandage if you have a bowel movement. You may notice a small amount of bleeding with bowel movements for the first few days. You may have some packing in the rectum which will come out over the first day or two. You  will need to wear an absorbent pad or soft cotton gauze in your underwear until the drainage stops.it. °8. ACTIVITIES:  You may resume regular (light) daily activities beginning the next day--such as daily self-care, walking, climbing stairs--gradually increasing activities as tolerated.  You may have sexual intercourse when it is comfortable.  Refrain from any heavy lifting or straining until approved by your doctor. °a. You may drive when you are no longer taking prescription pain medication, you can comfortably wear a seatbelt, and you can safely maneuver your car and apply brakes. °b. RETURN TO WORK: : ____________________ °c.  °9. You should see your doctor in the office for a follow-up appointment approximately 2-3 weeks after your surgery.  Make sure that you call for this appointment within a day or two after you arrive home to insure a convenient appointment time. °10. OTHER INSTRUCTIONS:  __________________________________________________________________________________________________________________________________________________________________________________________  °WHEN TO CALL YOUR DOCTOR: °1. Fever over 101.0 °2. Inability to urinate °3. Nausea and/or vomiting °4. Extreme swelling or bruising °5. Continued bleeding from rectum. °6. Increased pain, redness, or drainage from the incision °7. Constipation ° °The clinic staff is available to answer your questions during regular business hours.  Please don’t hesitate to call and ask to speak to one of the nurses for clinical concerns.  If you have a medical emergency, go to the nearest emergency room or call 911.  A surgeon from [facility] is always on call at the hospital ° ° °[address], [address], [place], [place]  [address] ? °   [address] [address], , [place]   [address] °[phone] ? [phone] ? FAX [phone] °Web site: [email or web address] ° °

## 2012-04-06 NOTE — Transfer of Care (Signed)
Immediate Anesthesia Transfer of Care Note  Patient: Kevin [name]  Procedure(s) Performed: Procedure(s) (LRB): EXAM UNDER ANESTHESIA (N/A)  Patient Location: PACU  Anesthesia Type: General  Level of Consciousness: awake and alert   Airway & Oxygen Therapy: Patient Spontanous Breathing and Patient connected to nasal cannula oxygen  Post-op Assessment: Report given to PACU RN  Post vital signs: Reviewed  Complications: No apparent anesthesia complications

## 2012-04-06 NOTE — Progress Notes (Addendum)
1610  Thursday..........Marland Kitchen After speaking with patient, he did not, tho he was instructed...to take his coreg and to NOT to drink anything after   1:30 AM....the patient drank a 1/2 cup black coffee at 0430.Marland KitchenMarland KitchenMarland KitchenI spoke with Dr Katrinka Blazing, who instructed me to call the  ologist in that room.......(Dr Phillips Grout)....Marland KitchenMarland KitchenWill try and call him again in about 15 min.  Because if we are following the rules, patient may be delayed...Marland KitchenDA  [phone]  Spoke with Dr. Acey Lav....will proceed with the procedure........DA

## 2012-04-06 NOTE — Interval H&P Note (Signed)
History and Physical Interval Note:  04/06/2012 7:16 AM  Kevin [name]  has presented today for surgery, with the diagnosis of fistula in ano  The various methods of treatment have been discussed with the patient and family. After consideration of risks, benefits and other options for treatment, the patient has consented to  Procedure(s) (LRB): EXAM UNDER ANESTHESIA (N/A) as a surgical intervention .  The patients' history has been reviewed, patient examined, no change in status, stable for surgery.  I have reviewed the patients' chart and labs.  Questions were answered to the patient's satisfaction.     [name] A.

## 2012-04-07 ENCOUNTER — Encounter (HOSPITAL_COMMUNITY): Payer: Self-pay | Admitting: Surgery

## 2012-04-21 ENCOUNTER — Encounter (INDEPENDENT_AMBULATORY_CARE_PROVIDER_SITE_OTHER): Payer: Self-pay | Admitting: Surgery

## 2012-04-21 ENCOUNTER — Ambulatory Visit (INDEPENDENT_AMBULATORY_CARE_PROVIDER_SITE_OTHER): Payer: No Typology Code available for payment source | Admitting: Surgery

## 2012-04-21 VITALS — BP 138/78 | HR 70 | Temp 98.0°F | Resp 12 | Ht 75.0 in | Wt 205.0 lb

## 2012-04-21 DIAGNOSIS — Z9889 Other specified postprocedural states: Secondary | ICD-10-CM

## 2012-04-21 NOTE — Patient Instructions (Signed)
Follow up as needed

## 2012-04-21 NOTE — Progress Notes (Signed)
Pt returns after Eua.  No source of abscess found.  Small area debrided.  Doing well.  Wound in perineum healed.  Return as needed.

## 2012-04-25 ENCOUNTER — Telehealth (INDEPENDENT_AMBULATORY_CARE_PROVIDER_SITE_OTHER): Payer: Self-pay | Admitting: General Surgery

## 2012-04-25 NOTE — Telephone Encounter (Signed)
Received call from patient stating that he would like Dr. Luisa Hart to issue a prescription of Lorazepam for anxiety. Patient stated that Dr. Luisa Hart has not issued that medication to him before and his other doctor had not issued it to him in about two years. I advised him that our office does not treat anxiety nor issue medications for it. I advised he needs to contact the doctor that originally issued the medication or that doctors office to advise them he would need a prescription issued.

## 2012-07-14 ENCOUNTER — Ambulatory Visit (INDEPENDENT_AMBULATORY_CARE_PROVIDER_SITE_OTHER): Payer: No Typology Code available for payment source | Admitting: Surgery

## 2012-07-14 ENCOUNTER — Encounter (INDEPENDENT_AMBULATORY_CARE_PROVIDER_SITE_OTHER): Payer: Self-pay | Admitting: Surgery

## 2012-07-14 VITALS — BP 134/76 | HR 70 | Temp 97.4°F | Resp 16 | Ht 75.0 in | Wt 206.2 lb

## 2012-07-14 DIAGNOSIS — R6884 Jaw pain: Secondary | ICD-10-CM | POA: Insufficient documentation

## 2012-07-14 DIAGNOSIS — R198 Other specified symptoms and signs involving the digestive system and abdomen: Secondary | ICD-10-CM

## 2012-07-14 NOTE — Patient Instructions (Signed)
RETURN 1 MONTH.  CALL IF SYMPTOMS WORSEN.

## 2012-07-14 NOTE — Progress Notes (Signed)
Subjective:     Patient ID: Kevin [name], male   DOB: [birth year], 66 y.o.   MRN: [id number]  HPI patient returns do to rectal discharge. He has a history of a perirectal abscess and previous exam under anesthesia with debridement of some granulation tissue. A couple weeks ago, he noticed significant bloody discharge but no pain. There is no pus. This soaked through his underwear and slacks. This resolved on its own over the next few days. He has had no problems since. 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   Review of Systems  Constitutional: Negative.   HENT: Negative.   Eyes: Negative.   Gastrointestinal: Positive for anal bleeding.  Musculoskeletal: Negative.        Objective:   Physical Exam  HENT:  Head:    Genitourinary:          Assessment:     RECTAL DISCHARGE /  BLEEDING  TMJ PAIN    Plan:     Refer to ENT to evaluate left jaw pain and possible TMJ pathology. Rectal discharge has stopped. Etiology unclear. Will have him return in one month for recheck and reexamination. This could be hemorrhoidal in nature or an occult fistula ano per his exam today is normal. He will call me if his symptoms worsen.

## 2012-07-20 ENCOUNTER — Telehealth: Payer: Self-pay | Admitting: Family Medicine

## 2012-07-20 NOTE — Telephone Encounter (Signed)
[facility] called to req refill of pts sildenafil (VIAGRA) 100 MG tablet to [facility] on [address] and [address] [employer]

## 2012-07-21 MED ORDER — SILDENAFIL CITRATE 100 MG PO TABS: 100.0000 mg | ORAL_TABLET | ORAL | Status: DC | PRN

## 2012-08-11 ENCOUNTER — Ambulatory Visit (INDEPENDENT_AMBULATORY_CARE_PROVIDER_SITE_OTHER): Payer: No Typology Code available for payment source | Admitting: Family Medicine

## 2012-08-11 ENCOUNTER — Encounter: Payer: Self-pay | Admitting: Family Medicine

## 2012-08-11 VITALS — BP 130/70 | HR 89 | Temp 98.7°F | Wt 204.0 lb

## 2012-08-11 DIAGNOSIS — K589 Irritable bowel syndrome without diarrhea: Secondary | ICD-10-CM

## 2012-08-11 NOTE — Progress Notes (Signed)
  Subjective:    Patient ID: Kevin [name], male    DOB: [birth year], 66 y.o.   MRN: [id number]  HPI Here for 10 days of loose stools and some excess flatus. He is averaging 3 stools a day. No nausea or vomting. No fever . Appetite is good. He has had some recent rectal bleeding due to granulation tissue, and he has seen Dr. Luisa Hart for this. He has not seen any blood for the past 10 days. He admits to being under a lot of stress lately, with job related issues and with going through a separation from his wife. He normally exercises 3 times a week but he has not done so for several weeks. He does not use much caffeine. No recent antibiotic use, no recent travel.    Review of Systems  Constitutional: Negative.   Respiratory: Negative.   Cardiovascular: Negative.   Gastrointestinal: Positive for diarrhea. Negative for nausea, vomiting, abdominal pain, constipation, blood in stool, abdominal distention and rectal pain.  Psychiatric/Behavioral: Negative for dysphoric mood and decreased concentration. The patient is nervous/anxious.        Objective:   Physical Exam  Constitutional: He appears well-developed and well-nourished. No distress.  Abdominal: Soft. Bowel sounds are normal. He exhibits no distension and no mass. There is no tenderness. There is no rebound and no guarding.          Assessment & Plan:  This may be some IBS. We discussed ways to reduce his stress, and he plans to get back into his exercise routine. Try Imodium AD as needed. Try a probiotic like Align daily. Recheck prn

## 2012-08-14 ENCOUNTER — Ambulatory Visit (INDEPENDENT_AMBULATORY_CARE_PROVIDER_SITE_OTHER): Payer: No Typology Code available for payment source | Admitting: Surgery

## 2012-08-14 ENCOUNTER — Encounter (INDEPENDENT_AMBULATORY_CARE_PROVIDER_SITE_OTHER): Payer: Self-pay | Admitting: Surgery

## 2012-08-14 VITALS — BP 162/90 | HR 72 | Temp 98.4°F | Resp 16 | Ht 75.0 in | Wt 204.2 lb

## 2012-08-14 DIAGNOSIS — R198 Other specified symptoms and signs involving the digestive system and abdomen: Secondary | ICD-10-CM

## 2012-08-14 NOTE — Progress Notes (Signed)
Subjective:     Patient ID: Kevin [name], male   DOB: [birth year], 66 y.o.   MRN: [id number]  HPI patient returns today do to history of rectal discharge. 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 He has intermittent rectal discharge which is purulent in nature and intermittent. He denies any recent history of discharge or bleeding per rectum. He feels well.   Review of Systems  Constitutional: Negative.   HENT: Negative.   Eyes: Negative.   Cardiovascular: Negative.   Gastrointestinal: Negative for blood in stool and rectal pain.       Objective:   Physical Exam  Constitutional: He appears well-developed and well-nourished.  HENT:  Head: Normocephalic and atraumatic.  Eyes: EOM are normal. Pupils are equal, round, and reactive to light.  Genitourinary: Rectum normal and prostate normal.          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

## 2012-08-14 NOTE — Patient Instructions (Signed)
RETURN 3 MONTHS OR SOONER IF SYMPTOMS WORSEN

## 2012-09-20 ENCOUNTER — Encounter: Payer: Self-pay | Admitting: Family Medicine

## 2012-09-20 ENCOUNTER — Ambulatory Visit (INDEPENDENT_AMBULATORY_CARE_PROVIDER_SITE_OTHER): Payer: PRIVATE HEALTH INSURANCE | Admitting: Family Medicine

## 2012-09-20 VITALS — BP 140/80 | Temp 97.6°F | Wt 208.0 lb

## 2012-09-20 DIAGNOSIS — Z23 Encounter for immunization: Secondary | ICD-10-CM

## 2012-09-20 DIAGNOSIS — J069 Acute upper respiratory infection, unspecified: Secondary | ICD-10-CM

## 2012-09-20 NOTE — Patient Instructions (Addendum)

## 2012-09-20 NOTE — Progress Notes (Signed)
  Subjective:    Patient ID: Kevin [name], male    DOB: 1946/01/11, 66 y.o.   MRN: [id number]  HPI  Patient seen with 3 day history of URI symptoms. He developed sore throat this past Sunday. Monday onset of nonproductive cough. No fever. No nausea or vomiting. No headaches. Feels slightly better today. Has taken Advil with some relief. Patient is nonsmoker. His chronic problems include history of type 2 diabetes and hypertension. Diabetes been well controlled.   Review of Systems  Constitutional: Negative for fever and chills.  HENT: Positive for congestion.   Respiratory: Positive for cough. Negative for shortness of breath and wheezing.   Cardiovascular: Negative for chest pain.  Neurological: Negative for headaches.       Objective:   Physical Exam  Constitutional: He appears well-developed and well-nourished.  HENT:  Right Ear: External ear normal.  Left Ear: External ear normal.  Mouth/Throat: Oropharynx is clear and moist.  Neck: Neck supple.  Cardiovascular: Normal rate and regular rhythm.   Pulmonary/Chest: Effort normal and breath sounds normal. No respiratory distress. He has no wheezes. He has no rales.  Lymphadenopathy:    He has no cervical adenopathy.          Assessment & Plan:  Viral URI. Reassurance. Continue Advil or Tylenol for symptom relief. Flu vaccine given since he does not have any fever today

## 2012-09-29 ENCOUNTER — Telehealth: Payer: Self-pay | Admitting: Family Medicine

## 2012-09-29 NOTE — Telephone Encounter (Signed)
Patient Information:  Caller Name: Lorin Picket  Phone: [phone]  Patient: Kevin [name], Kevin [name]  Gender: Male  DOB: 09-19-1946  Age: 66 Years  PCP: Evelena Peat (Family Practice)   Symptoms  Reason For Call & Symptoms: States he believes he has attention deficit disorder and would like to discuss medication with Dr. Caryl Never.  Reviewed Health History In EMR: Yes  Reviewed Medications In EMR: Yes  Reviewed Allergies In EMR: Yes  Date of Onset of Symptoms: Unknown  Guideline(s) Used:  Depression  No Protocol Available - Sick Adult  Disposition Per Guideline:   See Within 2 Weeks in Office  Reason For Disposition Reached:   Nursing judgment  Advice Given:  N/A  Office Follow Up:  Does the office need to follow up with this patient?: No  Instructions For The Office: N/A  Appointment Scheduled:  10/04/2012 09:30:00  RN Note:  Denies emergent symptoms; wants to discuss with Dr. Caryl Never.  Appt scheduled 10/04/12 0930 x 30 min wiht Dr. Caryl Never.  krs/can

## 2012-10-04 ENCOUNTER — Encounter: Payer: Self-pay | Admitting: Family Medicine

## 2012-10-04 ENCOUNTER — Ambulatory Visit (INDEPENDENT_AMBULATORY_CARE_PROVIDER_SITE_OTHER): Payer: PRIVATE HEALTH INSURANCE | Admitting: Family Medicine

## 2012-10-04 VITALS — BP 150/70 | Temp 97.8°F | Wt 207.0 lb

## 2012-10-04 DIAGNOSIS — R41840 Attention and concentration deficit: Secondary | ICD-10-CM

## 2012-10-04 DIAGNOSIS — IMO0001 Reserved for inherently not codable concepts without codable children: Secondary | ICD-10-CM

## 2012-10-04 DIAGNOSIS — F4321 Adjustment disorder with depressed mood: Secondary | ICD-10-CM

## 2012-10-04 DIAGNOSIS — R030 Elevated blood-pressure reading, without diagnosis of hypertension: Secondary | ICD-10-CM

## 2012-10-04 MED ORDER — BUPROPION HCL ER (XL) 150 MG PO TB24: 150.0000 mg | ORAL_TABLET | Freq: Every day | ORAL | Status: DC

## 2012-10-04 NOTE — Progress Notes (Signed)
Subjective:    Patient ID: Kevin [name], male    DOB: Feb 15, 1946, 66 y.o.   MRN: [id number]  HPI  Patient seen with issues regarding possible ADD.  Has had some  depressed mood issues recently. His wife of 14 years left several months ago. He is getting counseling with his minister. He has had some depression but overall coping fairly well. No suicidal ideation. He takes citalopram 20 mg once daily.  He states for years he had difficulty focusing with easy distractibility and sometimes difficulty completing tasks. He is concerned about attention deficit disorder. This sometimes creates difficulties with work. He has difficulties focusing in > one environment. He had some difficulty sorting out how much of this may be related to recent situational stress. Never treated with medication. He has chronic problems including type 2 diabetes, chronic intermittent insomnia, hyperlipidemia, and hypertension. Not monitoring blood pressures. Medications reviewed.  Past Medical History  Diagnosis Date  . Diabetes mellitus   . Chronic kidney disease     stones  . UTI (urinary tract infection)   . Peri-rectal abscess   . Sinus bradycardia   . Anemia   . Pacemaker   . Blood [place]     "[place]  . Nonischemic cardiomyopathy    Past Surgical History  Procedure Date  . Insert / replace / remove pacemaker 12/01/11    initial placement  . Inguinal hernia repair 1980    left  . Tonsillectomy   . Shrapnel surgery     "in [place]"  . Examination under anesthesia 04/06/2012    Procedure: EXAM UNDER ANESTHESIA;  Surgeon: Maisie Fus A. Cornett, MD;  Location: [facility] OR;  Service: General;  Laterality: N/A;    reports that he quit smoking about 28 years ago. His smoking use included Cigarettes. He has a 20 pack-year smoking history. He has never used smokeless tobacco. He reports that he drinks alcohol. He reports that he does not use illicit drugs. family history includes Cancer in his mother and  Leukemia in his father. Allergies  Allergen Reactions  . Sulfa Antibiotics Swelling    Swelling of lips only.      Review of Systems  Constitutional: Negative for fatigue.  Eyes: Negative for visual disturbance.  Respiratory: Negative for cough, chest tightness and shortness of breath.   Cardiovascular: Negative for chest pain, palpitations and leg swelling.  Neurological: Negative for dizziness, syncope, weakness, light-headedness and headaches.       Objective:   Physical Exam  Constitutional: He is oriented to person, place, and time. He appears well-developed and well-nourished.  Cardiovascular: Normal rate and regular rhythm.   Pulmonary/Chest: Effort normal and breath sounds normal. No respiratory distress. He has no wheezes. He has no rales.  Neurological: He is alert and oriented to person, place, and time. No cranial nerve deficit.  Psychiatric: He has a normal mood and affect. His behavior is normal. Judgment and thought content normal.          Assessment & Plan:  #1 adjustment disorder with depressed mood. Continue counseling. He'll let us know if he decides to pursue additional counseling services. He'll continue to see minister for the time being. Add Wellbutrin XL 150 mg once daily. Hopefully this can help somewhat his focusing ability. Reassess within 2 months #2 attention concerns. We discussed possible referral to specialist to evaluate for ADD but at this point we would not fill comfortable treated with stimulant medications anyway because of his borderline elevated blood  pressure #3 continue close monitoring of blood pressure and encouraged home blood pressure cuff. Be in touch if consistently greater than 140/90

## 2012-10-04 NOTE — Patient Instructions (Addendum)
Monitor blood pressures and be in touch if consistently > 140/90

## 2012-10-16 ENCOUNTER — Ambulatory Visit (INDEPENDENT_AMBULATORY_CARE_PROVIDER_SITE_OTHER): Payer: PRIVATE HEALTH INSURANCE | Admitting: Family Medicine

## 2012-10-16 ENCOUNTER — Encounter: Payer: Self-pay | Admitting: Family Medicine

## 2012-10-16 VITALS — BP 160/90 | Temp 98.1°F

## 2012-10-16 DIAGNOSIS — E119 Type 2 diabetes mellitus without complications: Secondary | ICD-10-CM

## 2012-10-16 DIAGNOSIS — I10 Essential (primary) hypertension: Secondary | ICD-10-CM

## 2012-10-16 MED ORDER — LISINOPRIL-HYDROCHLOROTHIAZIDE 20-12.5 MG PO TABS: 1.0000 | ORAL_TABLET | Freq: Every day | ORAL | Status: DC

## 2012-10-16 NOTE — Patient Instructions (Addendum)
Continue checking your blood pressures everyday at home while on the new medication (lisinopril/HCTZ 20/12.5).  Follow up again in a month.   Diabetes Meal Planning Guide The diabetes meal planning guide is a tool to help you plan your meals and snacks. It is important for people with diabetes to manage their blood glucose (sugar) levels. Choosing the right foods and the right amounts throughout your day will help control your blood glucose. Eating right can even help you improve your blood pressure and reach or maintain a healthy weight. CARBOHYDRATE COUNTING MADE EASY When you eat carbohydrates, they turn to sugar. This raises your blood glucose level. Counting carbohydrates can help you control this level so you feel better. When you plan your meals by counting carbohydrates, you can have more flexibility in what you eat and balance your medicine with your food intake. Carbohydrate counting simply means adding up the total amount of carbohydrate grams in your meals and snacks. Try to eat about the same amount at each meal. Foods with carbohydrates are listed below. Each portion below is 1 carbohydrate serving or 15 grams of carbohydrates. Ask your dietician how many grams of carbohydrates you should eat at each meal or snack. Grains and Starches  1 slice bread.   English muffin or hotdog/hamburger bun.   cup cold cereal (unsweetened).   cup cooked pasta or rice.   cup starchy vegetables (corn, potatoes, peas, beans, winter squash).  1 tortilla (6 inches).   bagel.  1 waffle or pancake (size of a CD).   cup cooked cereal.  4 to 6 small crackers. *Whole grain is recommended. Fruit  1 cup fresh unsweetened berries, melon, papaya, pineapple.  1 small fresh fruit.   banana or mango.   cup fruit juice (4 oz unsweetened).   cup canned fruit in natural juice or water.  2 tbs dried fruit.  12 to 15 grapes or cherries. Milk and Yogurt  1 cup fat-free or 1% milk.  1  cup soy milk.  6 oz light yogurt with sugar-free sweetener.  6 oz low-fat soy yogurt.  6 oz plain yogurt. Vegetables  1 cup raw or  cup cooked is counted as 0 carbohydrates or a "free" food.  If you eat 3 or more servings at 1 meal, count them as 1 carbohydrate serving. Other Carbohydrates   oz chips or pretzels.   cup ice cream or frozen yogurt.   cup sherbet or sorbet.  2 inch square cake, no frosting.  1 tbs honey, sugar, jam, jelly, or syrup.  2 small cookies.  3 squares of graham crackers.  3 cups popcorn.  6 crackers.  1 cup broth-based soup.  Count 1 cup casserole or other mixed foods as 2 carbohydrate servings.  Foods with less than 20 calories in a serving may be counted as 0 carbohydrates or a "free" food. You may want to purchase a book or computer software that lists the carbohydrate gram counts of different foods. In addition, the nutrition facts panel on the labels of the foods you eat are a good source of this information. The label will tell you how big the serving size is and the total number of carbohydrate grams you will be eating per serving. Divide this number by 15 to obtain the number of carbohydrate servings in a portion. Remember, 1 carbohydrate serving equals 15 grams of carbohydrate. SERVING SIZES Measuring foods and serving sizes helps you make sure you are getting the right amount of food. The list  below tells how big or small some common serving sizes are.  1 oz.........4 stacked dice.  3 oz........Marland KitchenDeck of cards.  1 tsp.......Marland KitchenTip of little finger.  1 tbs......Marland KitchenMarland KitchenThumb.  2 tbs.......Marland KitchenGolf ball.   cup......Marland KitchenHalf of a fist.  1 cup.......Marland KitchenA fist. SAMPLE DIABETES MEAL PLAN Below is a sample meal plan that includes foods from the grain and starches, dairy, vegetable, fruit, and meat groups. A dietician can individualize a meal plan to fit your calorie needs and tell you the number of servings needed from each food group. However,  controlling the total amount of carbohydrates in your meal or snack is more important than making sure you include all of the food groups at every meal. You may interchange carbohydrate containing foods (dairy, starches, and fruits). The meal plan below is an example of a 2000 calorie diet using carbohydrate counting. This meal plan has 17 carbohydrate servings. Breakfast  1 cup oatmeal (2 carb servings).   cup light yogurt (1 carb serving).  1 cup blueberries (1 carb serving).   cup almonds. Snack  1 large apple (2 carb servings).  1 low-fat string cheese stick. Lunch  Chicken breast salad.  1 cup spinach.   cup chopped tomatoes.  2 oz chicken breast, sliced.  2 tbs low-fat [place] dressing.  12 whole-wheat crackers (2 carb servings).  12 to 15 grapes (1 carb serving).  1 cup low-fat milk (1 carb serving). Snack  1 cup carrots.   cup hummus (1 carb serving). Dinner  3 oz broiled salmon.  1 cup brown rice (3 carb servings). Snack  1  cups steamed broccoli (1 carb serving) drizzled with 1 tsp olive oil and lemon juice.  1 cup light pudding (2 carb servings). DIABETES MEAL PLANNING WORKSHEET Your dietician can use this worksheet to help you decide how many servings of foods and what types of foods are right for you.  BREAKFAST Food Group and Servings / Carb Servings Grain/Starches __________________________________ Dairy __________________________________________ Vegetable ______________________________________ Fruit ___________________________________________ Meat __________________________________________ Fat ____________________________________________ LUNCH Food Group and Servings / Carb Servings Grain/Starches ___________________________________ Dairy ___________________________________________ Fruit ____________________________________________ Meat ___________________________________________ Fat  _____________________________________________ [place] Food Group and Servings / Carb Servings Grain/Starches ___________________________________ Dairy ___________________________________________ Fruit ____________________________________________ Meat ___________________________________________ Fat _____________________________________________ SNACKS Food Group and Servings / Carb Servings Grain/Starches ___________________________________ Dairy ___________________________________________ Vegetable _______________________________________ Fruit ____________________________________________ Meat ___________________________________________ Fat _____________________________________________ DAILY TOTALS Starches _________________________ Vegetable ________________________ Fruit ____________________________ Dairy ____________________________ Meat ____________________________ Fat ______________________________ Document Released: 07/22/2005 Document Revised: 01/17/2012 Document Reviewed: 06/02/2009 Exit[employer] Patient Information 2013 [place], [place].

## 2012-10-16 NOTE — Progress Notes (Signed)
Subjective:     Patient ID: Kevin [name], male   DOB: 1946-01-20, 66 y.o.   MRN: [id number]  HPI Acute visit.  History of type 2 diabetes and hypertension.    Notes that several of his recent fasting BS in the morning have been high.  Highest reported was 223.  Only checking BS infrequently, however.  Currently taking metformin 1000mg  BID, and exercises several days per week.  Patient states that he doesn't recall exactly what he has eaten the night before these high AM readings, but on occasion it has been sweet foods like ice cream.  He is also requesting some guidance on types and quantities of snacks he can consume throughout the day consistent with diabetes management.  Patient has also noted some high BPs at home--150s-160s/80s-90s a couple of times per week, and he does check most mornings.  Denies any headache or visual disturbances.  Currently on lisinopril 20mg  daily.  Patient admits that he should do a better job watching his salt intake.  Review of Systems  Eyes: Negative for visual disturbance.  Respiratory: Negative for chest tightness and shortness of breath.   Cardiovascular: Negative for chest pain.  Neurological: Negative for headaches.       Objective:   Physical Exam  Constitutional: He appears well-developed and well-nourished. No distress.  Cardiovascular: Normal rate, regular rhythm and normal heart sounds.   Pulmonary/Chest: Effort normal and breath sounds normal. No respiratory distress.       Assessment:     66 year old here for evaluation of type 2 diabetes and hypertension control.    Plan:     1. Type 2 diabetes: no neuropathy or paresthesias, increased thirst or urination suggestive of worsening diabetes control.  [facility], but states that he has not been in a while.  Will re-check A1C today to assess long-term BS control.  Also refer patient to diabetes educator for further  intervention with meal and snack suggestions.  Continue metformin and exercise. 2. Hypertension: currently not at goal on lisinopril 20mg  daily.  Change to lisinopril/HCTZ 20/12.5, will start with 69-month supply to see response and follow-up again after that.  No history of gout.  Also encourage limiting salt in diet and staying active.  Marthann Schiller, MS3     Agree with assessment and plan as per Marthann Schiller, MS 3 Evelena Peat MD

## 2012-10-17 LAB — HEMOGLOBIN A1C: Hgb A1c MFr Bld: 6.9 % — ABNORMAL HIGH (ref 4.6–6.5)

## 2012-10-18 NOTE — Progress Notes (Signed)
Quick Note:  Pt informed on personally identified VM ______ 

## 2012-11-09 ENCOUNTER — Encounter: Payer: BC Managed Care – PPO | Attending: Family Medicine | Admitting: *Deleted

## 2012-11-09 ENCOUNTER — Encounter: Payer: Self-pay | Admitting: *Deleted

## 2012-11-09 VITALS — Ht 75.0 in | Wt 199.2 lb

## 2012-11-09 DIAGNOSIS — Z713 Dietary counseling and surveillance: Secondary | ICD-10-CM | POA: Insufficient documentation

## 2012-11-09 DIAGNOSIS — E119 Type 2 diabetes mellitus without complications: Secondary | ICD-10-CM

## 2012-11-09 NOTE — Patient Instructions (Addendum)
Plan: Aim for 4 Carb Choices per meal (60 grams) +/- 1 either way Aim for 0-2 Carbs per snack if hungry Continue with checking BG daily at alternate times of day Continue with current exercise plan Consider reading food labels for Total Carbohydrate of foods.

## 2012-11-09 NOTE — Progress Notes (Signed)
  Medical Nutrition Therapy:  Appt start time: 1115 end time:  1215.  Assessment:  Primary concerns today: patient here for diabetes education. He works at a [employer] full time. Lives alone and eats out often. Goes to the gym 3 days a week, likes to play golf and enjoys the beach. He checks his BG most mornings.  MEDICATIONS: see list   DIETARY INTAKE:  Usual eating pattern includes 3 meals and 1-3 snacks per day.  Everyday foods include good variety of all food groups.  Avoided foods include high fat, high calorie foods.    24-hr recall:  B ( AM): [place] meat biscuit OR unsweetened cereal with fresh fruit, 2% milk, decaf coffee with Splenda and creamer Snk ( AM): fresh fruit occasionally  L ( PM): eats out baked chicken, salad, Ranch dressing or [place], water or unsweet tea Snk ( PM): fresh fruit D ( PM): eats out 4-5 nights a week; sit down restaurant - lean meat, starch, vegetable, whole wheat bread occasionally, water or tea Snk ( PM): fresh fruit if anything Beverages: water, coffee, unsweet tea  Usual physical activity: gym 3-4 times a week  Estimated energy needs: 2000 calories 225 g carbohydrates 150 g protein 56 g fat  Progress Towards Goal(s):  In progress.   Nutritional Diagnosis:  NB-1.1 Food and nutrition-related knowledge deficit As related to diabetes management.  As evidenced by A1c increased to 6.9%.    Intervention:  Nutrition counseling and diabetes education initiated. Discussed basic physiology of diabetes, SMBG and rationale of checking BG at alternate times of day, A1c, Carb Counting and reading food labels, and benefits of increased activity.   Plan: Aim for 4 Carb Choices per meal (60 grams) +/- 1 either way Aim for 0-2 Carbs per snack if hungry Continue with checking BG daily at alternate times of day Continue with current exercise plan Consider reading food labels for Total Carbohydrate of foods.  Handouts given during  visit include: Diabetes and You by NovoNordisk Carb Counting and Food Label handouts Meal Plan Card  Monitoring/Evaluation:  Dietary intake, exercise, reading food labels, and body weight prn.

## 2012-11-13 ENCOUNTER — Telehealth: Payer: Self-pay | Admitting: *Deleted

## 2012-11-13 ENCOUNTER — Ambulatory Visit: Payer: PRIVATE HEALTH INSURANCE | Admitting: Family Medicine

## 2012-11-13 NOTE — Telephone Encounter (Signed)
Pt forgot his 1 month follow-up visit today.  He will call to reschedule

## 2012-11-17 ENCOUNTER — Ambulatory Visit (INDEPENDENT_AMBULATORY_CARE_PROVIDER_SITE_OTHER): Payer: BC Managed Care – PPO | Admitting: Family Medicine

## 2012-11-17 ENCOUNTER — Encounter: Payer: Self-pay | Admitting: Family Medicine

## 2012-11-17 VITALS — BP 142/78 | Temp 98.0°F | Wt 206.0 lb

## 2012-11-17 DIAGNOSIS — I10 Essential (primary) hypertension: Secondary | ICD-10-CM

## 2012-11-17 DIAGNOSIS — E119 Type 2 diabetes mellitus without complications: Secondary | ICD-10-CM

## 2012-11-17 NOTE — Progress Notes (Signed)
  Subjective:    Patient ID: Kevin [name], male    DOB: 10-17-1946, 67 y.o.   MRN: [id number]  HPI Patient seen for followup hypertension. We changed from lisinopril to lisinopril HCTZ. Blood pressure has been stable. No headaches. No dizziness. Blood sugars have been fairly well controlled. Most recent A1c 6.9%. He had followup with nutritionist and has gotten some good information from them regarding healthy snacks. He has couple scheduled follow ups- he thinks. No specific concerns today. Compliant with all medications. Denies any side effects. He had lipids assessed through the [place] previously.  Past Medical History  Diagnosis Date  . Diabetes mellitus   . Chronic kidney disease     stones  . UTI (urinary tract infection)   . Peri-rectal abscess   . Sinus bradycardia   . Anemia   . Pacemaker   . [place]     [place]  . Nonischemic cardiomyopathy    Past Surgical History  Procedure Date  . Insert / replace / remove pacemaker 12/01/11    initial placement  . Inguinal hernia repair 1980    left  . Tonsillectomy   . Shrapnel surgery     "in [place]"  . Examination under anesthesia 04/06/2012    Procedure: EXAM UNDER ANESTHESIA;  Surgeon: Maisie Fus A. Cornett, MD;  Location: [facility] OR;  Service: General;  Laterality: N/A;    reports that he quit smoking about 28 years ago. His smoking use included Cigarettes. He has a 20 pack-year smoking history. He has never used smokeless tobacco. He reports that he drinks alcohol. He reports that he does not use illicit drugs. family history includes Cancer in his mother and Leukemia in his father. Allergies  Allergen Reactions  . Sulfa Antibiotics Swelling    Swelling of lips only.      Review of Systems  Constitutional: Negative for fatigue.  Eyes: Negative for visual disturbance.  Respiratory: Negative for cough, chest tightness and shortness of breath.   Cardiovascular: Negative for chest pain, palpitations and leg  swelling.  Neurological: Negative for dizziness, syncope, weakness, light-headedness and headaches.       Objective:   Physical Exam  Constitutional: He appears well-developed and well-nourished. No distress.  Neck: Neck supple. No thyromegaly present.  Cardiovascular: Normal rate and regular rhythm.   Pulmonary/Chest: Effort normal and breath sounds normal. No respiratory distress. He has no wheezes. He has no rales.  Musculoskeletal: He exhibits no edema.          Assessment & Plan:  #1 hypertension. Improved. Continue close monitoring. Reassess in 4 months and if still up over 130/80 at that point consider adjustment medications #2 type 2 diabetes. Good control by recent readings. Continue nutrition followup

## 2012-11-23 ENCOUNTER — Encounter: Payer: Self-pay | Admitting: Family Medicine

## 2012-11-23 ENCOUNTER — Ambulatory Visit (INDEPENDENT_AMBULATORY_CARE_PROVIDER_SITE_OTHER): Payer: BC Managed Care – PPO | Admitting: Family Medicine

## 2012-11-23 VITALS — BP 140/78 | Temp 98.7°F

## 2012-11-23 DIAGNOSIS — R413 Other amnesia: Secondary | ICD-10-CM

## 2012-11-23 DIAGNOSIS — I10 Essential (primary) hypertension: Secondary | ICD-10-CM

## 2012-11-23 NOTE — Progress Notes (Signed)
  Subjective:    Patient ID: Kevin [name], male    DOB: June 28, 1946, 67 y.o.   MRN: [id number]  HPI  Patient seen with concern for elevated blood pressure. Recently switched to lisinopril HCTZ. This morning had transient nosebleed right naris. Blood pressure 160/98. This was with wrist cuff. No headache. No further nosebleed. Patient also takes carvedilol 6.25 mg twice daily. Blood pressures have been generally well controlled until this morning. No chest pains.  Type 2 diabetes with recent A1c 6.9%. Patient had some concerns for mild short-term memory deficit. He questions whether metformin could be causing this. He continues to work full time. No significant fatigue issues. No headaches. No focal neurologic symptoms. No head injury.    Past Medical History  Diagnosis Date  . Diabetes mellitus   . Chronic kidney disease     stones  . UTI (urinary tract infection)   . Peri-rectal abscess   . Sinus bradycardia   . Anemia   . Pacemaker   . [place]     [place]  . Nonischemic cardiomyopathy    Past Surgical History  Procedure Date  . Insert / replace / remove pacemaker 12/01/11    initial placement  . Inguinal hernia repair 1980    left  . Tonsillectomy   . Shrapnel surgery     "in [place]"  . Examination under anesthesia 04/06/2012    Procedure: EXAM UNDER ANESTHESIA;  Surgeon: Maisie Fus A. Cornett, MD;  Location: [facility] OR;  Service: General;  Laterality: N/A;    reports that he quit smoking about 28 years ago. His smoking use included Cigarettes. He has a 20 pack-year smoking history. He has never used smokeless tobacco. He reports that he drinks alcohol. He reports that he does not use illicit drugs. family history includes Cancer in his mother and Leukemia in his father. Allergies  Allergen Reactions  . Sulfa Antibiotics Swelling    Swelling of lips only.      Review of Systems  Constitutional: Negative for appetite change, fatigue and unexpected weight  change.  Eyes: Negative for visual disturbance.  Respiratory: Negative for cough, chest tightness and shortness of breath.   Cardiovascular: Negative for chest pain, palpitations and leg swelling.  Neurological: Negative for dizziness, syncope, weakness, light-headedness and headaches.  Psychiatric/Behavioral: Negative for dysphoric mood.       Objective:   Physical Exam  Constitutional: He is oriented to person, place, and time. He appears well-developed and well-nourished.  HENT:  Nose: Nose normal.  Mouth/Throat: Oropharynx is clear and moist.  Cardiovascular: Normal rate and regular rhythm.   Pulmonary/Chest: Effort normal and breath sounds normal. No respiratory distress. He has no wheezes. He has no rales.  Musculoskeletal: He exhibits no edema.  Neurological: He is alert and oriented to person, place, and time. No cranial nerve deficit.  Psychiatric: He has a normal mood and affect. His behavior is normal. Judgment and thought content normal.       MMSE 29/30          Assessment & Plan:  #1 hypertension. By repeat today at rest 130/70. Continue current medications and monitoring #2 mild cognitive impairment. Check TSH and B12. Consider repeat MMSE in 4-6 months

## 2012-12-04 ENCOUNTER — Ambulatory Visit (INDEPENDENT_AMBULATORY_CARE_PROVIDER_SITE_OTHER): Payer: BC Managed Care – PPO | Admitting: Surgery

## 2012-12-04 ENCOUNTER — Encounter (INDEPENDENT_AMBULATORY_CARE_PROVIDER_SITE_OTHER): Payer: Self-pay | Admitting: Surgery

## 2012-12-04 VITALS — BP 122/79 | HR 77 | Temp 98.6°F | Resp 16 | Ht 75.0 in | Wt 199.4 lb

## 2012-12-04 DIAGNOSIS — Z872 Personal history of diseases of the skin and subcutaneous tissue: Secondary | ICD-10-CM

## 2012-12-04 DIAGNOSIS — L989 Disorder of the skin and subcutaneous tissue, unspecified: Secondary | ICD-10-CM

## 2012-12-04 DIAGNOSIS — Z8719 Personal history of other diseases of the digestive system: Secondary | ICD-10-CM

## 2012-12-04 IMAGING — CR DG CHEST 2V
2 series · 2 of 2 positions shown · non-contrast
Comparison: November 11, 2011

CLINICAL DATA: Status post pacemaker placement

CHEST - 2 VIEW

[w chest pa]
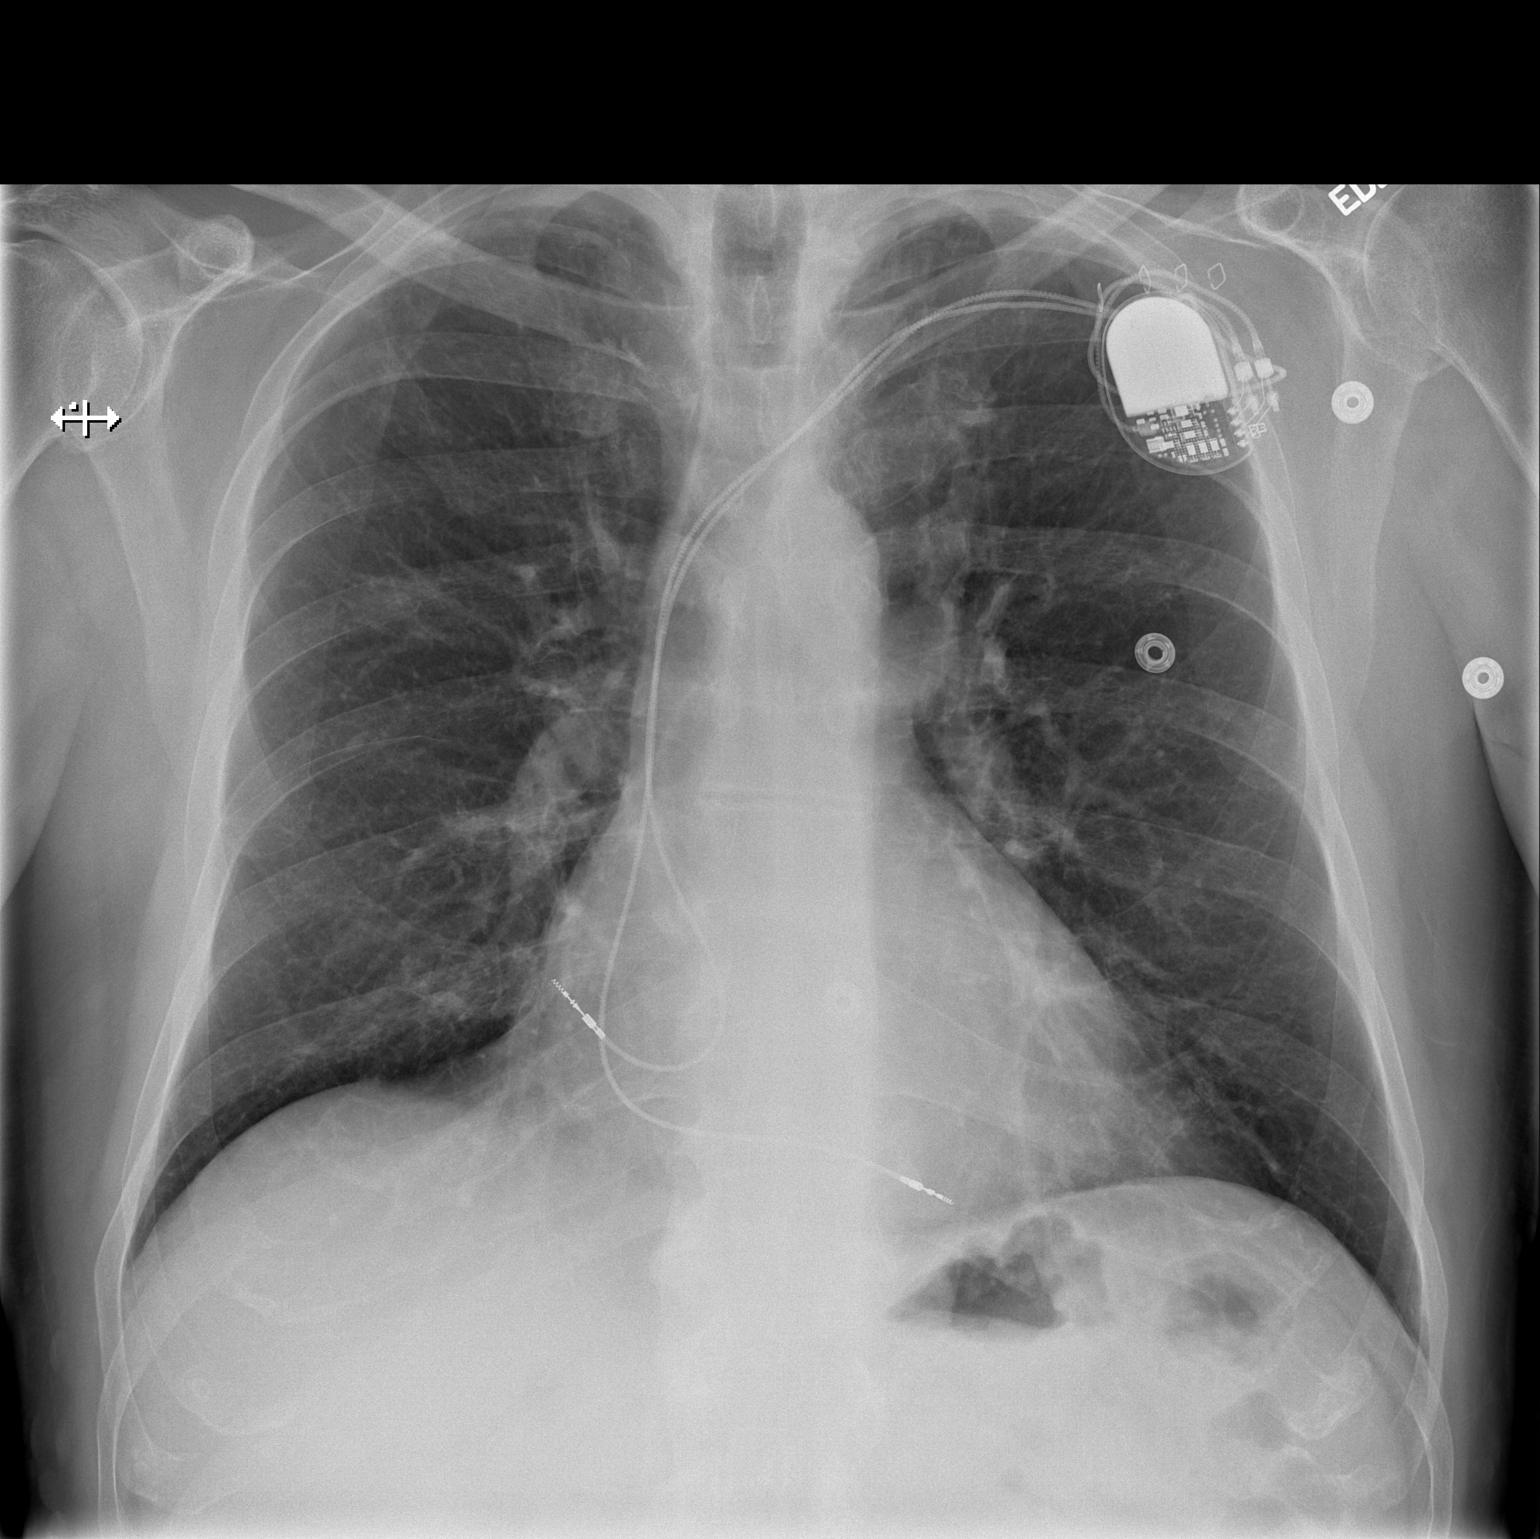

[w chest lat]
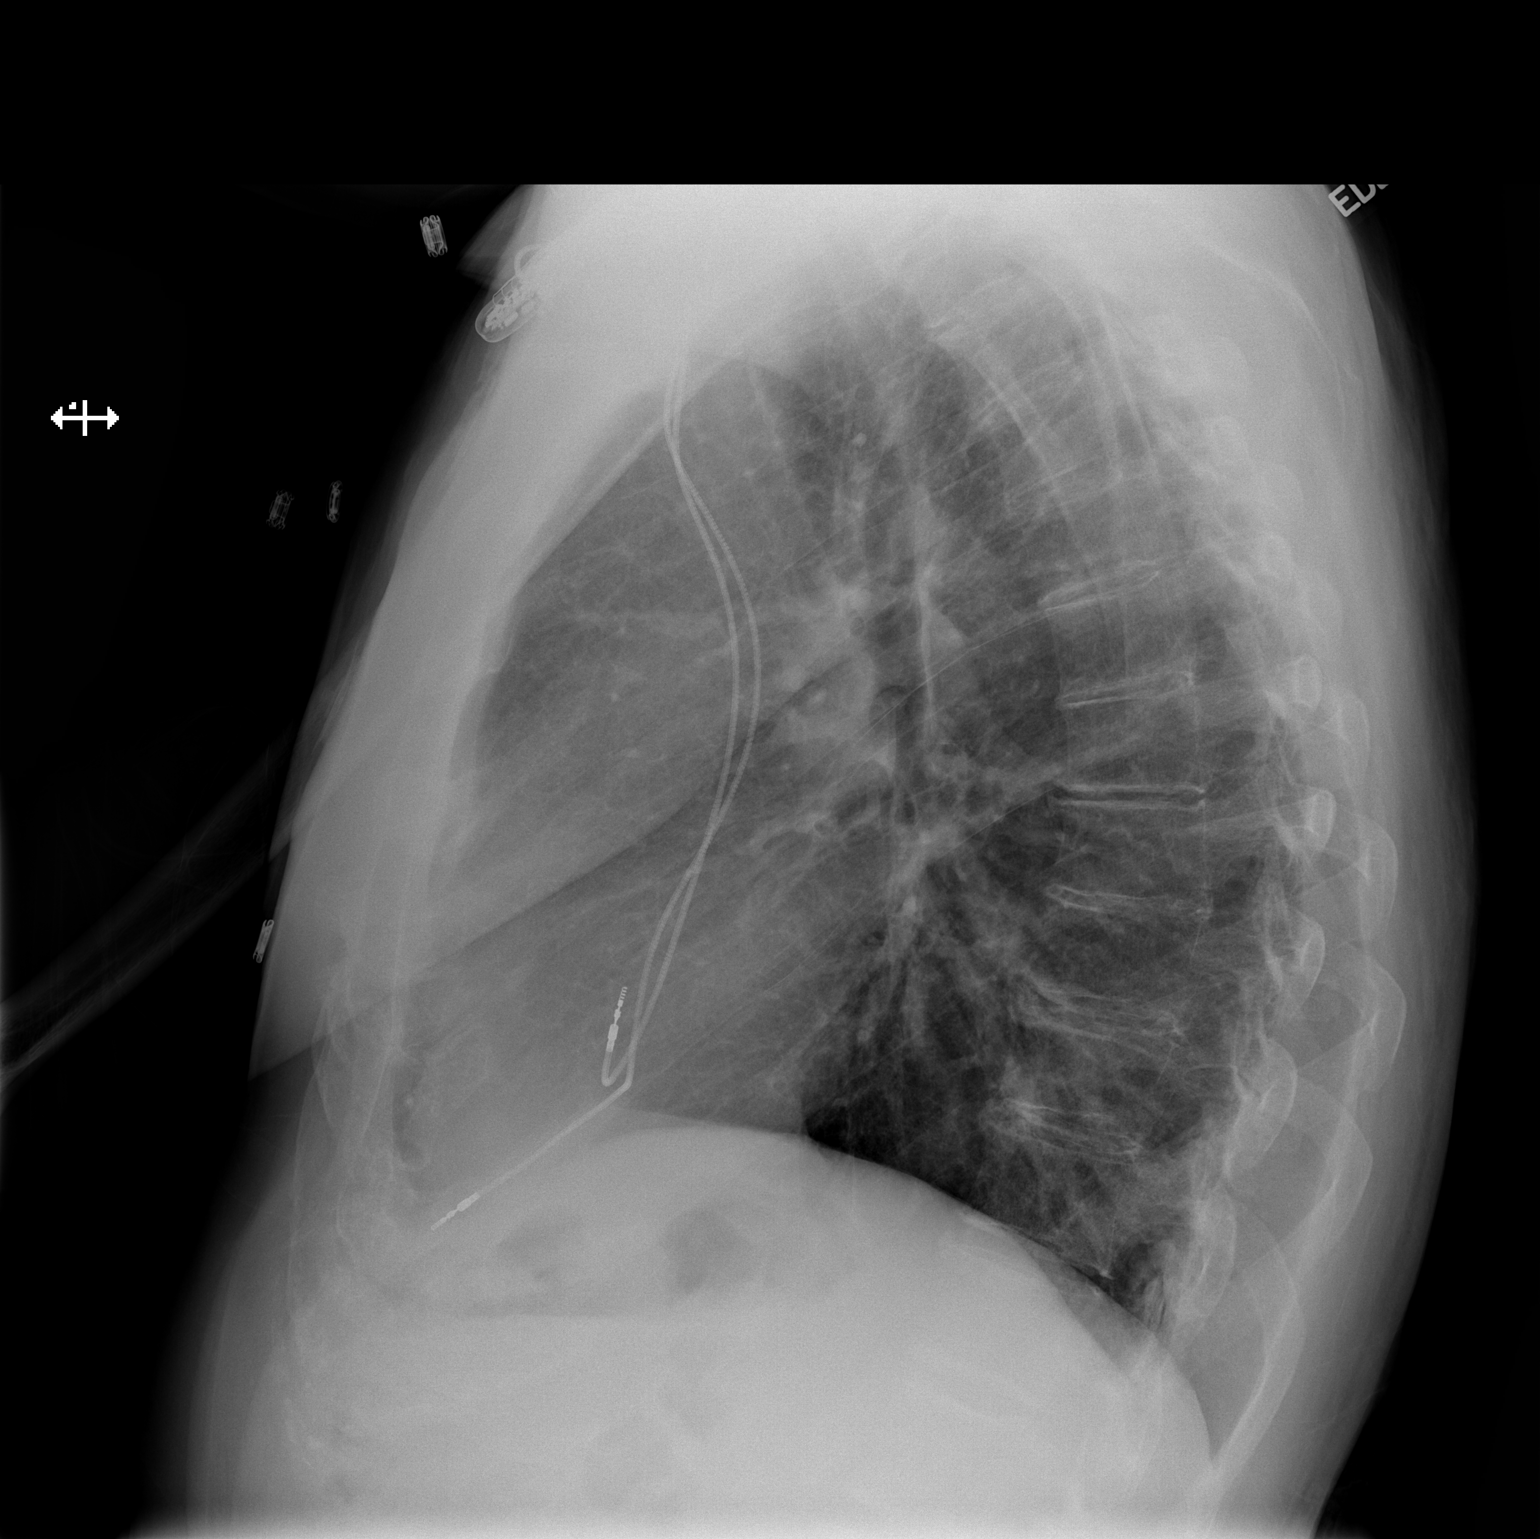

[2 of 2 positions shown; findings below may reference images not displayed]

FINDINGS: A double lead pacemaker is in place with intact leads and
the tips overlying the right atrium and right ventricle.  No
pneumothorax.  Mild cardiomegaly is unchanged.  The pulmonary
vasculature is within normal limits.  There are no infiltrates or
effusions.
IMPRESSION: Pacemaker leads are intact and in good position.  No pneumothorax
or pulmonary edema.

## 2012-12-04 NOTE — Patient Instructions (Signed)
No further abscess.  Will refer to urology for penile skin lesion.

## 2012-12-04 NOTE — Progress Notes (Signed)
Subjective:     Patient ID: Kevin [name], male   DOB: 1945-11-19, 67 y.o.   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 He denies any rectal drainage, rectal bleeding, or rectal mass. He has a large skin lesion on the shaft of his penis. He has no other complaints.  Review of Systems  Constitutional: Negative.   Gastrointestinal: Negative for anal bleeding and rectal pain.  Musculoskeletal: Negative.        Objective:   Physical Exam  Constitutional: He appears well-developed and well-nourished.  Neck: Normal range of motion. Neck supple.  Genitourinary: Rectal exam shows external hemorrhoid. Rectal exam shows no internal hemorrhoid, no fissure, no mass and no tenderness.             Assessment:     History of rectal abscess resolved Penis skin lesion    Plan:     Refer to urology Follow up as needed.

## 2012-12-05 ENCOUNTER — Telehealth (INDEPENDENT_AMBULATORY_CARE_PROVIDER_SITE_OTHER): Payer: Self-pay | Admitting: General Surgery

## 2012-12-05 NOTE — Telephone Encounter (Signed)
Spoke with patient appt was made for 12/08/12 @ 11:00 am [facility] Dr Margarita Grizzle . Explained to patient about co pay and  Insurance payment. Notes will be faxed.

## 2012-12-13 ENCOUNTER — Telehealth: Payer: Self-pay | Admitting: *Deleted

## 2012-12-13 MED ORDER — ONDANSETRON HCL 4 MG PO TABS: 4.0000 mg | ORAL_TABLET | Freq: Three times a day (TID) | ORAL | Status: DC | PRN

## 2012-12-13 NOTE — Telephone Encounter (Signed)
Call in ondansetron 4 mg one po tid prn # 30 .  No RF.  If symptoms do not improve, or he develops symptoms of dehydration.  He needs to be seen in office

## 2012-12-13 NOTE — Telephone Encounter (Signed)
Rx sent to pharmacy.  Left detailed message on machine for patient.

## 2012-12-13 NOTE — Telephone Encounter (Signed)
Patient is calling with GI symptoms.  Started last night with nausea, vomiting and diarrhea.  He has no fever or pain.  Patient is requesting a prescription called into [facility] on [address].  Please advise.

## 2013-02-15 ENCOUNTER — Encounter (INDEPENDENT_AMBULATORY_CARE_PROVIDER_SITE_OTHER): Payer: Self-pay

## 2013-02-19 DIAGNOSIS — N4889 Other specified disorders of penis: Secondary | ICD-10-CM | POA: Insufficient documentation

## 2013-03-20 ENCOUNTER — Ambulatory Visit: Payer: BC Managed Care – PPO | Admitting: Family Medicine

## 2013-05-01 DIAGNOSIS — N4889 Other specified disorders of penis: Secondary | ICD-10-CM | POA: Insufficient documentation

## 2013-05-05 DIAGNOSIS — N489 Disorder of penis, unspecified: Secondary | ICD-10-CM | POA: Insufficient documentation

## 2013-05-14 DIAGNOSIS — A630 Anogenital (venereal) warts: Secondary | ICD-10-CM | POA: Insufficient documentation

## 2013-05-28 ENCOUNTER — Telehealth: Payer: Self-pay

## 2013-05-28 NOTE — Telephone Encounter (Signed)
Bupropion Xl 150 mg  Last refill 10/04/2012 #30 5 refills  Last visit on 03/20/13  [facility] pharmacy

## 2013-05-28 NOTE — Telephone Encounter (Signed)
Refill for 3 months but pt needs office follow up.

## 2013-05-29 MED ORDER — BUPROPION HCL ER (XL) 150 MG PO TB24: 150.0000 mg | ORAL_TABLET | Freq: Every day | ORAL | Status: DC

## 2013-06-12 ENCOUNTER — Encounter: Payer: Self-pay | Admitting: Gastroenterology

## 2013-06-29 ENCOUNTER — Encounter: Payer: Self-pay | Admitting: *Deleted

## 2013-07-02 ENCOUNTER — Other Ambulatory Visit: Payer: Self-pay | Admitting: Family Medicine

## 2013-07-02 MED ORDER — TADALAFIL 20 MG PO TABS: ORAL_TABLET | ORAL | Status: DC

## 2013-07-02 NOTE — Telephone Encounter (Signed)
OK to change to Cialis 20 mg every other day prn

## 2013-07-02 NOTE — Telephone Encounter (Signed)
rx changed and sent the Cialis

## 2013-07-02 NOTE — Telephone Encounter (Signed)
Is it okay to change patient viagra to cialis so that pt can afford it.

## 2013-07-03 ENCOUNTER — Ambulatory Visit: Payer: Medicare Other | Admitting: Gastroenterology

## 2013-07-03 ENCOUNTER — Encounter: Payer: Self-pay | Admitting: Gastroenterology

## 2013-07-17 ENCOUNTER — Encounter: Payer: Self-pay | Admitting: Gastroenterology

## 2013-07-17 ENCOUNTER — Ambulatory Visit (INDEPENDENT_AMBULATORY_CARE_PROVIDER_SITE_OTHER): Payer: BC Managed Care – PPO | Admitting: Gastroenterology

## 2013-07-17 VITALS — BP 132/72 | HR 80 | Ht 73.5 in | Wt 200.5 lb

## 2013-07-17 DIAGNOSIS — Z950 Presence of cardiac pacemaker: Secondary | ICD-10-CM

## 2013-07-17 DIAGNOSIS — Z9189 Other specified personal risk factors, not elsewhere classified: Secondary | ICD-10-CM

## 2013-07-17 DIAGNOSIS — Z77098 Contact with and (suspected) exposure to other hazardous, chiefly nonmedicinal, chemicals: Secondary | ICD-10-CM

## 2013-07-17 DIAGNOSIS — Z86010 Personal history of colonic polyps: Secondary | ICD-10-CM

## 2013-07-17 DIAGNOSIS — E119 Type 2 diabetes mellitus without complications: Secondary | ICD-10-CM

## 2013-07-17 MED ORDER — NA SULFATE-K SULFATE-MG SULF 17.5-3.13-1.6 GM/177ML PO SOLN: ORAL | Status: DC

## 2013-07-17 NOTE — Progress Notes (Signed)
History of Present Illness:  This is a very pleasant 67 year old Caucasian male he actually is free of GI complaints at this time.  He had a colon adenoma removed 5 years ago by Dr. Sabino Gasser.  He denies upper or lower GI complaints or any hepatobiliary problems.  He receives most of his care through the [facility].  He has had acid reflux, but endoscopy with esophageal biopsies was negative, and he denies GERD at this time.  He has regular daily bowel movements without melena or hematochezia, and denies anorexia, weight loss, or any history of hepatitis or pancreatitis.  He was in service in [place] in the 1960s.  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  He also has adult onset diabetes, but is not insulin-dependent.  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  I have reviewed this patient's present history, medical and surgical past history, allergies and medications.     ROS:   All systems were reviewed and are negative unless otherwise stated in the HPI.    Physical Exam: Blood pressure 132/72, pulse 80 and regular and weight 200 pounds. General well developed well nourished patient in no acute distress, appearing their stated age Eyes PERRLA, no icterus, fundoscopic exam per opthamologist Skin no lesions noted Neck supple, no adenopathy, no thyroid enlargement, no tenderness Chest clear to percussion and auscultation Heart no significant murmurs, gallops or rubs noted Abdomen no hepatosplenomegaly masses or tenderness, BS normal.  Rectal inspection normal no fissures, or fistulae noted.  No masses or tenderness on digital exam. Stool guaiac negative.  Skin graft area on left thigh. Extremities no acute joint lesions, edema, phlebitis or evidence of cellulitis. Neurologic patient oriented x 3, cranial nerves intact, no focal neurologic deficits noted. Psychological mental status normal and  normal affect.  Assessment and plan: This patient needs followup colonoscopy per his history of previous colon adenoma 5 years ago.  He has some minor medical problems, he is a good candidate for colonoscopy with a balanced electrolyte preparation and propofol sedation.  I've explained the risk and benefits of colonoscopy to him in detail, and we will proceed as planned.  Is no other real GI complaints to address at this time.  Otherwise his continue his medications as listed and reviewed.  Report from Dr. Sabino Gasser reviewed today.

## 2013-07-17 NOTE — Patient Instructions (Addendum)
You have been scheduled for a colonoscopy with propofol. Please follow written instructions given to you at your visit today.  Please pick up your prep kit at the pharmacy within the next 1-3 days. If you use inhalers (even only as needed), please bring them with you on the day of your procedure. Your physician has requested that you go to www.startemmi.com and enter the access code given to you at your visit today. This web site gives a general overview about your procedure. However, you should still follow specific instructions given to you by our office regarding your preparation for the procedure.                                                We are excited to introduce MyChart, a new best-in-class service that provides you online access to important information in your electronic medical record. We want to make it easier for you to view your health information - all in one secure location - when and where you need it. We expect MyChart will enhance the quality of care and service we provide.  When you register for MyChart, you can:    View your test results.    Request appointments and receive appointment reminders via email.    Request medication renewals.    View your medical history, allergies, medications and immunizations.    Communicate with your physician's office through a password-protected site.    Conveniently print information such as your medication lists.  To find out if MyChart is right for you, please talk to a member of our clinical staff today. We will gladly answer your questions about this free health and wellness tool.  If you are age 18 or older and want a member of your family to have access to your record, you must provide written consent by completing a proxy form available at our office. Please speak to our clinical staff about guidelines regarding accounts for patients younger than age 18.  As you activate your MyChart account and need any technical  assistance, please call the MyChart technical support line at [phone] ([phone]) or email your question to [email or web address]. If you email your question(s), please include your name, a return phone number and the best time to reach you.  If you have non-urgent health-related questions, you can send a message to our office through MyChart at [email or web address]. If you have a medical emergency, call 911.  Thank you for using MyChart as your new health and wellness resource!   MyChart licensed from [employer],  1999-2010. Patents Pending.   

## 2013-08-03 ENCOUNTER — Telehealth: Payer: Self-pay | Admitting: *Deleted

## 2013-08-03 ENCOUNTER — Telehealth: Payer: Self-pay | Admitting: Cardiovascular Disease

## 2013-08-03 NOTE — Telephone Encounter (Signed)
Dr Alanda Amass, Mr Neubert is having a repeat Colonoscopy on 08/08/13. With his cardiac history and current EF of 35-40% per ECHO on 01/21/12, he will need to be done at [facility]. Pt states he works out 3-4 x week and has no shortness of breath or chest pain during exercise. Could you please do another ECHO to evaluate his EF for possible improvement so he may be done at our facility; Dr Jarold Motto no longer does hospital procedures? Pt is willing to reschedule his COLON and he is willing to have the ECHO done. Thanks, Graciella Freer, RN for Dr Sheryn Bison   Sent this note to Dr Lorelei Pont; Joyce Gross at [facility] felt this is the best way to get a message to Dr Alanda Amass since he is in [place] today. Fax [phone] Cathlyn Parsons, CRNA is trying to upgrade pt's EF in order for him to have him COLON in the [facility]. He has spoken with the pt several times and pt is agreeable to r/s his COLON and having a repeat ECHO.  lmom for pt to cal back.

## 2013-08-06 NOTE — Telephone Encounter (Signed)
Kevin [name] with Dr Alanda Amass called this am and she has scheduled Kevin [name] for an ECHO in the am so hopefully, he can keep his COLON on 08/08/13 and won't have to r/s. Faxed new prep instructions to Kevin [name].

## 2013-08-07 ENCOUNTER — Ambulatory Visit (HOSPITAL_COMMUNITY)
Admission: RE | Admit: 2013-08-07 | Discharge: 2013-08-07 | Disposition: A | Discharge status: Home / Self Care | Payer: BC Managed Care – PPO | Source: Ambulatory Visit | Attending: Cardiovascular Disease | Admitting: Cardiovascular Disease

## 2013-08-07 ENCOUNTER — Other Ambulatory Visit (HOSPITAL_COMMUNITY): Payer: Self-pay | Admitting: Cardiovascular Disease

## 2013-08-07 DIAGNOSIS — I059 Rheumatic mitral valve disease, unspecified: Secondary | ICD-10-CM | POA: Insufficient documentation

## 2013-08-07 DIAGNOSIS — I447 Left bundle-branch block, unspecified: Secondary | ICD-10-CM | POA: Insufficient documentation

## 2013-08-07 DIAGNOSIS — Z01810 Encounter for preprocedural cardiovascular examination: Secondary | ICD-10-CM

## 2013-08-07 DIAGNOSIS — I517 Cardiomegaly: Secondary | ICD-10-CM | POA: Insufficient documentation

## 2013-08-07 DIAGNOSIS — I079 Rheumatic tricuspid valve disease, unspecified: Secondary | ICD-10-CM | POA: Insufficient documentation

## 2013-08-07 NOTE — Progress Notes (Signed)
*  PRELIMINARY RESULTS* Echocardiogram 2D Echocardiogram has been performed.  Bessie Boyte 08/07/2013, 11:40 AM

## 2013-08-08 ENCOUNTER — Encounter: Payer: BC Managed Care – PPO | Admitting: Gastroenterology

## 2013-08-08 NOTE — Telephone Encounter (Signed)
lmom for pt to call back. Hid ECHO results are in and his EF is 50-55%; also want to make sure he received his new prep instructions.

## 2013-08-13 ENCOUNTER — Encounter: Payer: BC Managed Care – PPO | Admitting: Gastroenterology

## 2013-08-13 ENCOUNTER — Telehealth: Payer: Self-pay | Admitting: *Deleted

## 2013-08-13 NOTE — Telephone Encounter (Signed)
Pt never received my email with prep instructions and did not show up for his appt today. Dr Jarold Motto informed and pt r/s to 08/20/13. EMAILed instructions to pt. Pt received instructions per his EMAIL reply.

## 2013-08-16 NOTE — Telephone Encounter (Signed)
Checked with pt and his wife has the day off, he has his prep and he will be here on Monday, 08/20/13.

## 2013-08-20 ENCOUNTER — Ambulatory Visit (AMBULATORY_SURGERY_CENTER): Payer: BC Managed Care – PPO | Admitting: Gastroenterology

## 2013-08-20 ENCOUNTER — Encounter: Payer: Self-pay | Admitting: Gastroenterology

## 2013-08-20 VITALS — BP 136/66 | HR 62 | Temp 98.5°F | Resp 16 | Ht 73.0 in | Wt 200.0 lb

## 2013-08-20 DIAGNOSIS — D126 Benign neoplasm of colon, unspecified: Secondary | ICD-10-CM

## 2013-08-20 DIAGNOSIS — Z860100 Personal history of colon polyps, unspecified: Secondary | ICD-10-CM

## 2013-08-20 DIAGNOSIS — Z1211 Encounter for screening for malignant neoplasm of colon: Secondary | ICD-10-CM

## 2013-08-20 LAB — GLUCOSE, CAPILLARY: Glucose-Capillary: 189 mg/dL — ABNORMAL HIGH (ref 70–99)

## 2013-08-20 MED ORDER — SODIUM CHLORIDE 0.9 % IV SOLN: 500.0000 mL | INTRAVENOUS | Status: DC

## 2013-08-20 NOTE — Patient Instructions (Signed)
YOU HAD AN ENDOSCOPIC PROCEDURE TODAY AT THE [facility]: Refer to the procedure report that was given to you for any specific questions about what was found during the examination.  If the procedure report does not answer your questions, please call your gastroenterologist to clarify.  If you requested that your care partner not be given the details of your procedure findings, then the procedure report has been included in a sealed envelope for you to review at your convenience later.  YOU SHOULD EXPECT: Some feelings of bloating in the abdomen. Passage of more gas than usual.  Walking can help get rid of the air that was put into your GI tract during the procedure and reduce the bloating. If you had a lower endoscopy (such as a colonoscopy or flexible sigmoidoscopy) you may notice spotting of blood in your stool or on the toilet paper. If you underwent a bowel prep for your procedure, then you may not have a normal bowel movement for a few days.  DIET: Your first meal following the procedure should be a light meal and then it is ok to progress to your normal diet.  A half-sandwich or bowl of soup is an example of a good first meal.  Heavy or fried foods are harder to digest and may make you feel nauseous or bloated.  Likewise meals heavy in dairy and vegetables can cause extra gas to form and this can also increase the bloating.  Drink plenty of fluids but you should avoid alcoholic beverages for 24 hours.  ACTIVITY: Your care partner should take you home directly after the procedure.  You should plan to take it easy, moving slowly for the rest of the day.  You can resume normal activity the day after the procedure however you should NOT DRIVE or use heavy machinery for 24 hours (because of the sedation medicines used during the test).    SYMPTOMS TO REPORT IMMEDIATELY: A gastroenterologist can be reached at any hour.  During normal business hours, 8:30 AM to 5:00 PM Monday through Friday,  call [phone].  After hours and on weekends, please call the GI answering service at [phone] who will take a message and have the physician on call contact you.   Following lower endoscopy (colonoscopy or flexible sigmoidoscopy):  Excessive amounts of blood in the stool  Significant tenderness or worsening of abdominal pains  Swelling of the abdomen that is new, acute  Fever of 100F or higher  FOLLOW UP: If any biopsies were taken you will be contacted by phone or by letter within the next 1-3 weeks.  Call your gastroenterologist if you have not heard about the biopsies in 3 weeks.  Our staff will call the home number listed on your records the next business day following your procedure to check on you and address any questions or concerns that you may have at that time regarding the information given to you following your procedure. This is a courtesy call and so if there is no answer at the home number and we have not heard from you through the emergency physician on call, we will assume that you have returned to your regular daily activities without incident.  SIGNATURES/CONFIDENTIALITY: You and/or your care partner have signed paperwork which will be entered into your electronic medical record.  These signatures attest to the fact that that the information above on your After Visit Summary has been reviewed and is understood.  Full responsibility of the confidentiality of this   discharge information lies with you and/or your care-partner.    Resume medications. Information given on polyps with discharge instructions. 

## 2013-08-20 NOTE — Progress Notes (Signed)
Report to pacu rn, vss, bbs=clear 

## 2013-08-20 NOTE — Progress Notes (Signed)
Called to room to assist during endoscopic procedure.  Patient ID and intended procedure confirmed with present staff. Received instructions for my participation in the procedure from the performing physician.  

## 2013-08-20 NOTE — Op Note (Signed)
[facility] [address] [place], [address]   COLONOSCOPY PROCEDURE REPORT  PATIENT: Kevin [name], Kevin [name]  MR#: [id number] BIRTHDATE: 29-Jan-1946 , 67  yrs. old GENDER: Male ENDOSCOPIST: Mardella Layman, MD, [facility] REFERRED NW:GNFAO Caryl Never, M.D. PROCEDURE DATE:  08/20/2013 PROCEDURE:   Colonoscopy with snare polypectomy First Screening Colonoscopy - Avg.  risk and is 50 yrs.  old or older - No.  Prior Negative Screening - Now for repeat screening. N/A  History of Adenoma - Now for follow-up colonoscopy & has been > or = to 3 yrs.  Yes hx of adenoma.  Has been 3 or more years since last colonoscopy.  Polyps Removed Today? Yes. ASA CLASS:   Class III INDICATIONS:Patient's personal history of colon polyps. MEDICATIONS: Fentanyl-Quick Pick and propofol (Diprivan) 200mg  IV  DESCRIPTION OF PROCEDURE:   After the risks benefits and alternatives of the procedure were thoroughly explained, informed consent was obtained.  A digital rectal exam revealed no abnormalities of the rectum.   The LB [id number]  endoscope was introduced through the anus and advanced to the cecum, which was identified by both the appendix and ileocecal valve. No adverse events experienced.   The quality of the prep was excellent, using MoviPrep  The instrument was then slowly withdrawn as the colon was fully examined.      COLON FINDINGS: Four smooth flat polyps ranging between 3-35mm in size were found. Polyps at hepatic flexure and descending colon.Also multiple 1-2 mmm hyperplastic niodules noted in left colon. A polypectomy was performed with a cold snare of the 4 alrger polyps..  The resection was complete and the polyp tissue was completely retrieved.  Retroflexed views revealed no abnormalities. The time to cecum=2 minutes 36 seconds.  Withdrawal time=12 minutes 50 seconds.  The scope was withdrawn and the procedure completed. COMPLICATIONS: There were no  complications.  ENDOSCOPIC IMPRESSION: Four flat polyps ranging between 3-63mm in size were found; polypectomy was performed with a cold snare .Marland KitchenMarland Kitchenprobaqble adenomas.  RECOMMENDATIONS: 1.  Await pathology results 2.  Continue current medications 3.  Repeat Colonoscopy in 3 years.   eSigned:  Mardella Layman, MD, [facility] 08/20/2013 8:53 AM   cc:   PATIENT NAME:  [name], [name] MR#: [id number]

## 2013-08-20 NOTE — Progress Notes (Signed)
Patient did not experience any of the following events: a burn prior to discharge; a fall within the facility; wrong site/side/patient/procedure/implant event; or a hospital transfer or hospital admission upon discharge from the facility. (G8907) Patient did not have preoperative order for IV antibiotic SSI prophylaxis. (G8918)  

## 2013-08-21 ENCOUNTER — Telehealth: Payer: Self-pay

## 2013-08-21 NOTE — Telephone Encounter (Signed)
Left message on answering machine. 

## 2013-08-22 ENCOUNTER — Encounter: Payer: Self-pay | Admitting: *Deleted

## 2013-08-23 ENCOUNTER — Encounter: Payer: Self-pay | Admitting: Family Medicine

## 2013-08-23 ENCOUNTER — Ambulatory Visit (INDEPENDENT_AMBULATORY_CARE_PROVIDER_SITE_OTHER): Payer: BC Managed Care – PPO | Admitting: Family Medicine

## 2013-08-23 VITALS — BP 132/68 | HR 81 | Temp 97.9°F | Wt 207.0 lb

## 2013-08-23 DIAGNOSIS — K219 Gastro-esophageal reflux disease without esophagitis: Secondary | ICD-10-CM

## 2013-08-23 DIAGNOSIS — Z23 Encounter for immunization: Secondary | ICD-10-CM

## 2013-08-23 NOTE — Progress Notes (Signed)
  Subjective:    Patient ID: Kevin [name], male    DOB: 07/14/1946, 67 y.o.   MRN: [id number]  HPI Patient is seen following episode of 4 AM today of burning substernal pain. He recalls taking vodka with orange juice last night. He got up at 4 AM and tried tums but this did not help. He eventually took Pepcid which did help. Generally does not have any and reflux problems. He has not any dysphagia. Had recent colonoscopy which revealed benign-appearing polyps.  Patient is a nonsmoker. No regular alcohol use. Minimal caffeine use. Denies any appetite or weight changes. No melena. No hematemesis. Substernal pain is already about 50% improved from this morning Exercising without difficulty. No exertional chest pains. No dyspnea.  Past Medical History  Diagnosis Date  . Diabetes mellitus   . Chronic kidney disease     stones  . UTI (urinary tract infection)   . Peri-rectal abscess   . Sinus bradycardia   . Anemia   . Pacemaker   . Blood transfusion     "in [place]"  . Nonischemic cardiomyopathy   . History of colon polyps   . RBBB   . 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 Permanent pacemaker insertion  12/01/11    Medtronic  . Inguinal hernia repair  1980    left  . Tonsillectomy    . Shrapnel surgery      "in [place]"  . Examination under anesthesia  04/06/2012    Procedure: EXAM UNDER ANESTHESIA;  Surgeon: Maisie Fus A. Cornett, MD;  Location: [facility] OR;  Service: General;  Laterality: N/A;  . Nm myocar perf wall motion  01/21/2012    mod-severe perfusion defect due to infarct/scar w/mild perinfarct ischemia in the apical,basal inferoseptal,basal inferior,mid inferoseptal,midinferior and apical inferior regions  . Cardiac catheterization  01/27/2012    normal coronaries, dilated LV c/w nonischemic cardiomyopathy    reports that he quit smoking about 29 years ago. His smoking use included  Cigarettes. He has a 20 pack-year smoking history. He has never used smokeless tobacco. He reports that he drinks alcohol. He reports that he does not use illicit drugs. family history includes Leukemia in his father; Ovarian cancer in his mother. Allergies  Allergen Reactions  . Sulfa Antibiotics Swelling    Swelling of lips only.      Review of Systems  Constitutional: Negative for appetite change and unexpected weight change.  Respiratory: Negative for cough and shortness of breath.   Cardiovascular: Negative for chest pain.  Gastrointestinal: Negative for nausea, vomiting, diarrhea and blood in stool.       Objective:   Physical Exam  Constitutional: He appears well-developed and well-nourished.  HENT:  Mouth/Throat: Oropharynx is clear and moist.  Cardiovascular: Normal rate and regular rhythm.   Pulmonary/Chest: Breath sounds normal. No respiratory distress. He has no wheezes. He has no rales.  Abdominal: Soft. Bowel sounds are normal. He exhibits no distension. There is no tenderness. There is no rebound and no guarding.         Assessment & Plan:  GERD. Dietary factors discussed. Continue Pepcid 10 mg one to 2 every 12 hours. Provided samples of Nexium XR1 daily as needed. Elevate head of bed 6-8 inches. Avoid alcohol and minimize caffeine use. Avoid mint products. Followup if symptoms persist

## 2013-08-23 NOTE — Patient Instructions (Signed)
Gastroesophageal Reflux Disease, Adult Gastroesophageal reflux disease (GERD) happens when acid from your stomach flows up into the esophagus. When acid comes in contact with the esophagus, the acid causes soreness (inflammation) in the esophagus. Over time, GERD may create small holes (ulcers) in the lining of the esophagus. CAUSES   Increased body weight. This puts pressure on the stomach, making acid rise from the stomach into the esophagus.  Smoking. This increases acid production in the stomach.  Drinking alcohol. This causes decreased pressure in the lower esophageal sphincter (valve or ring of muscle between the esophagus and stomach), allowing acid from the stomach into the esophagus.  Late evening meals and a full stomach. This increases pressure and acid production in the stomach.  A malformed lower esophageal sphincter. Sometimes, no cause is found. SYMPTOMS   Burning pain in the lower part of the mid-chest behind the breastbone and in the mid-stomach area. This may occur twice a week or more often.  Trouble swallowing.  Sore throat.  Dry cough.  Asthma-like symptoms including chest tightness, shortness of breath, or wheezing. DIAGNOSIS  Your caregiver may be able to diagnose GERD based on your symptoms. In some cases, X-rays and other tests may be done to check for complications or to check the condition of your stomach and esophagus. TREATMENT  Your caregiver may recommend over-the-counter or prescription medicines to help decrease acid production. Ask your caregiver before starting or adding any new medicines.  HOME CARE INSTRUCTIONS   Change the factors that you can control. Ask your caregiver for guidance concerning weight loss, quitting smoking, and alcohol consumption.  Avoid foods and drinks that make your symptoms worse, such as:  Caffeine or alcoholic drinks.  Chocolate.  Peppermint or mint flavorings.  Garlic and onions.  Spicy foods.  Citrus fruits,  such as oranges, lemons, or limes.  Tomato-based foods such as sauce, chili, salsa, and pizza.  Fried and fatty foods.  Avoid lying down for the 3 hours prior to your bedtime or prior to taking a nap.  Eat small, frequent meals instead of large meals.  Wear loose-fitting clothing. Do not wear anything tight around your waist that causes pressure on your stomach.  Raise the head of your bed 6 to 8 inches with wood blocks to help you sleep. Extra pillows will not help.  Only take over-the-counter or prescription medicines for pain, discomfort, or fever as directed by your caregiver.  Do not take aspirin, ibuprofen, or other nonsteroidal anti-inflammatory drugs (NSAIDs). SEEK IMMEDIATE MEDICAL CARE IF:   You have pain in your arms, neck, jaw, teeth, or back.  Your pain increases or changes in intensity or duration.  You develop nausea, vomiting, or sweating (diaphoresis).  You develop shortness of breath, or you faint.  Your vomit is green, yellow, black, or looks like coffee grounds or blood.  Your stool is red, bloody, or black. These symptoms could be signs of other problems, such as heart disease, gastric bleeding, or esophageal bleeding. MAKE SURE YOU:   Understand these instructions.  Will watch your condition.  Will get help right away if you are not doing well or get worse. Document Released: 08/04/2005 Document Revised: 01/17/2012 Document Reviewed: 05/14/2011 [facility] Patient Information 2014 [place], [place].  Continue with Pepcid 1-2 twice daily and may add Nexium one daily, if needed. Hold alcohol for next few days Avoid mint products. Consider elevated head of bed 6-8 inches.

## 2013-08-24 ENCOUNTER — Encounter: Payer: Self-pay | Admitting: Gastroenterology

## 2013-08-27 ENCOUNTER — Encounter: Payer: Self-pay | Admitting: Cardiovascular Disease

## 2013-08-27 ENCOUNTER — Ambulatory Visit (INDEPENDENT_AMBULATORY_CARE_PROVIDER_SITE_OTHER): Payer: BC Managed Care – PPO | Admitting: Cardiovascular Disease

## 2013-08-27 VITALS — BP 138/80 | HR 68 | Resp 16 | Ht 75.0 in | Wt 204.8 lb

## 2013-08-27 DIAGNOSIS — E1169 Type 2 diabetes mellitus with other specified complication: Secondary | ICD-10-CM | POA: Insufficient documentation

## 2013-08-27 DIAGNOSIS — Z950 Presence of cardiac pacemaker: Secondary | ICD-10-CM

## 2013-08-27 DIAGNOSIS — E782 Mixed hyperlipidemia: Secondary | ICD-10-CM

## 2013-08-27 DIAGNOSIS — E785 Hyperlipidemia, unspecified: Secondary | ICD-10-CM

## 2013-08-27 DIAGNOSIS — I10 Essential (primary) hypertension: Secondary | ICD-10-CM

## 2013-08-27 DIAGNOSIS — E119 Type 2 diabetes mellitus without complications: Secondary | ICD-10-CM

## 2013-08-27 DIAGNOSIS — Z79899 Other long term (current) drug therapy: Secondary | ICD-10-CM

## 2013-08-27 DIAGNOSIS — R001 Bradycardia, unspecified: Secondary | ICD-10-CM

## 2013-08-27 DIAGNOSIS — I498 Other specified cardiac arrhythmias: Secondary | ICD-10-CM

## 2013-08-27 HISTORY — DX: Presence of cardiac pacemaker: Z95.0

## 2013-08-27 LAB — PACEMAKER DEVICE OBSERVATION
AL THRESHOLD: 0.5 V
ATRIAL PACING PM: 0.1
RV LEAD AMPLITUDE: 8 mv
RV LEAD IMPEDENCE PM: 467 Ohm
RV LEAD THRESHOLD: 0.75 V
VENTRICULAR PACING PM: 95.7

## 2013-08-27 MED ORDER — ATORVASTATIN CALCIUM 40 MG PO TABS: 40.0000 mg | ORAL_TABLET | Freq: Every day | ORAL | Status: DC

## 2013-08-27 NOTE — Patient Instructions (Addendum)
Remote monitoring is used to monitor your pacemaker from home. This monitoring reduces the number of office visits required to check your device to one time per year. It allows [place] to keep an eye on the functioning of your device to ensure it is working properly. You are scheduled for a device check from home on 11-28-2013. You may send your transmission at any time that day. If you have a wireless device, the transmission will be sent automatically. After your physician reviews your transmission, you will receive a postcard with your next transmission date.  Your physician recommends that you schedule a follow-up appointment in: 12 months    STOP Simvastatin.  START Atorvastatin 40mg  one daily.  Your physician recommends that you return for lab work in: 3 months at [facility] on the first floor.  You do not need an appointment.  Go one morning before you have had anything to eat or drink.

## 2013-08-27 NOTE — Assessment & Plan Note (Signed)
Target LDL cholesterol less than 578 mg/dL. Have recommended that he switch to atorvastatin 40 mg daily. I don't think we will achieve goal with simvastatin.

## 2013-08-27 NOTE — Progress Notes (Signed)
Patient ID: Kevin [name], male   DOB: 05-13-1946, 67 y.o.   MRN: [id number]      Reason for office visit Conduction system disease/pacemaker  I have known Ms. Hinger since January of 2013 when I implanted a dual chamber permanent pacemaker at the request of Dr. Alanda Amass. He is here for routine followup. He has been feeling well. A comprehensive check of his pacemaker was performed in the office today. This shows that despite programming his device AAIR-DDDR (MVP) he has 100% ventricular pacing. When we tested the device and the of this we were able to elicit native AV conduction simply by prolonging the AV delay to 210 ms. I'm not sure why the MVP algorithms did not work but we decided to reprogram with prolonged, but fixed AV delays. He has no cardiovascular complaints. Previous workup with echocardiography in September of 2014 and nuclear stress testing in March of 2013 has not shown any evidence of structural heart disease. He has diabetes mellitus but as far as I can tell is no longer on any antidiabetic medications.   Allergies  Allergen Reactions  . Sulfa Antibiotics Swelling    Swelling of lips only.    Current Outpatient Prescriptions  Medication Sig Dispense Refill  . aspirin 81 MG tablet Take 81 mg by mouth daily.        Marland Kitchen buPROPion (WELLBUTRIN XL) 150 MG 24 hr tablet Take 1 tablet (150 mg total) by mouth daily.  30 tablet  3  . carvedilol (COREG) 6.25 MG tablet Take 6.25 mg by mouth 2 (two) times daily with a meal.      . citalopram (CELEXA) 20 MG tablet Take 20 mg by mouth daily.        Marland Kitchen latanoprost (XALATAN) 0.005 % ophthalmic solution Place 1 drop into the right eye daily.       Marland Kitchen lisinopril-hydrochlorothiazide (PRINZIDE,ZESTORETIC) 20-12.5 MG per tablet Take 1 tablet by mouth daily.  30 tablet  11  . Loratadine 10 MG CAPS Take 10 mg by mouth daily.       Marland Kitchen LORazepam (ATIVAN) 0.5 MG tablet Daily.      . ondansetron (ZOFRAN) 4 MG tablet Take 1 tablet (4 mg total)  by mouth every 8 (eight) hours as needed for nausea.  30 tablet  0  . Saw Palmetto, Serenoa repens, 1000 MG CAPS Take by mouth daily.        . tadalafil (CIALIS) 20 MG tablet Take one tablet every other day as needed  10 tablet  1  . triamcinolone cream (KENALOG) 0.1 % as needed.      Marland Kitchen atorvastatin (LIPITOR) 40 MG tablet Take 1 tablet (40 mg total) by mouth daily.  90 tablet  3  . sildenafil (VIAGRA) 100 MG tablet Take 1 tablet (100 mg total) by mouth as needed for erectile dysfunction.  10 tablet  11   No current facility-administered medications for this visit.    Past Medical History  Diagnosis Date  . Diabetes mellitus   . Chronic kidney disease     stones  . UTI (urinary tract infection)   . Peri-rectal abscess   . Sinus bradycardia   . Anemia   . Pacemaker   . Blood transfusion     "in [place]"  . Nonischemic cardiomyopathy   . History of colon polyps   . RBBB   . Heart block AV second degree     permanent pacemaker 12/01/11 Medtronic    Past Surgical History  Procedure  Laterality Date  . Permanent pacemaker insertion  12/01/11    Medtronic  . Inguinal hernia repair  1980    left  . Tonsillectomy    . Shrapnel surgery      "in [place]"  . Examination under anesthesia  04/06/2012    Procedure: EXAM UNDER ANESTHESIA;  Surgeon: Maisie Fus A. Cornett, MD;  Location: [facility] OR;  Service: General;  Laterality: N/A;  . Nm myocar perf wall motion  01/21/2012    mod-severe perfusion defect due to infarct/scar w/mild perinfarct ischemia in the apical,basal inferoseptal,basal inferior,mid inferoseptal,midinferior and apical inferior regions  . Cardiac catheterization  01/27/2012    normal coronaries, dilated LV c/w nonischemic cardiomyopathy    Family History  Problem Relation Age of Onset  . Ovarian cancer Mother   . Leukemia Father     History   Social History  . Marital Status: Married    Spouse Name: N/A    Number of Children: 0  . Years of Education: N/A   Occupational  History  . [employer] banker    Social History Main Topics  . Smoking status: Former Smoker -- 1.00 packs/day for 20 years    Types: Cigarettes    Quit date: 04/13/1984  . Smokeless tobacco: Never Used  . Alcohol Use: Yes     Comment: 12/01/11 "maybe 3 beers or glasses of wine /month"  . Drug Use: No  . Sexual Activity: Not on file   Other Topics Concern  . Not on file   Social History Narrative  . No narrative on file    Review of systems: The patient specifically denies any chest pain at rest or with exertion, dyspnea at rest or with exertion, orthopnea, paroxysmal nocturnal dyspnea, syncope, palpitations, focal neurological deficits, intermittent claudication, lower extremity edema, unexplained weight gain, cough, hemoptysis or wheezing.  The patient also denies abdominal pain, nausea, vomiting, dysphagia, diarrhea, constipation, polyuria, polydipsia, dysuria, hematuria, frequency, urgency, abnormal bleeding or bruising, fever, chills, unexpected weight changes, mood swings, change in skin or hair texture, change in voice quality, auditory or visual problems, allergic reactions or rashes, new musculoskeletal complaints other than usual "aches and pains".   PHYSICAL EXAM BP 138/80  Pulse 68  Resp 16  Ht 6\' 3"  (1.905 m)  Wt 204 lb 12.8 oz (92.897 kg)  BMI 25.6 kg/m2  General: Alert, oriented x3, no distress Head: no evidence of trauma, PERRL, EOMI, no exophtalmos or lid lag, no myxedema, no xanthelasma; normal ears, nose and oropharynx Neck: normal jugular venous pulsations and no hepatojugular reflux; brisk carotid pulses without delay and no carotid bruits Chest: clear to auscultation, no signs of consolidation by percussion or palpation, normal fremitus, symmetrical and full respiratory excursions; healthy left subclavian pacer site Cardiovascular: normal position and quality of the apical impulse, regular rhythm, normal first and second heart sounds, no murmurs, rubs or  gallops Abdomen: no tenderness or distention, no masses by palpation, no abnormal pulsatility or arterial bruits, normal bowel sounds, no hepatosplenomegaly Extremities: no clubbing, cyanosis or edema; 2+ radial, ulnar and brachial pulses bilaterally; 2+ right femoral, posterior tibial and dorsalis pedis pulses; 2+ left femoral, posterior tibial and dorsalis pedis pulses; no subclavian or femoral bruits Neurological: grossly nonfocal   EKG: Atrial sensed ventricular paced rhythm  Lipid Panel  08/01/2013 total cholesterol 192, triglycerides 71, HDL 43, LDL 135 Hemoglobin A1c 6.5% Potassium 4.9, creatinine absent from our page of his report, but calculated GFR 89, normal LFTs  BMET    Component Value Date/Time  NA 139 04/05/2012 0835   K 5.3* 04/05/2012 0835   CL 103 04/05/2012 0835   CO2 28 04/05/2012 0835   GLUCOSE 150* 04/05/2012 0835   BUN 13 04/05/2012 0835   CREATININE 0.79 04/05/2012 0835   CALCIUM 9.4 04/05/2012 0835   GFRNONAA >90 04/05/2012 0835   GFRAA >90 04/05/2012 0835     ASSESSMENT AND PLAN Pacemaker Dual-chamber Medtronic Adapta implanted January 2013 for bradycardia with alternating bundle branch block and 2:1 atrioventricular block. Reprogrammed to fixed AV delays as described above. Reevaluate in 3 months. He is enrolled in the [employer] a remote monitoring system to  DM2 (diabetes mellitus, type 2) Fair glycemic control even without medications  Hypertension Target LDL cholesterol less than 161 mg/dL. Have recommended that he switch to atorvastatin 40 mg daily. I don't think we will achieve goal with simvastatin.   Orders Placed This Encounter  Procedures  . Lipid Profile  . Comp Met (CMET)  . Pacemaker Device Observation  . EKG 12-Lead   Meds ordered this encounter  Medications  . atorvastatin (LIPITOR) 40 MG tablet    Sig: Take 1 tablet (40 mg total) by mouth daily.    Dispense:  90 tablet    Refill:  3    Tiant Peixoto  [name], MD,  [facility] [phone] office [phone] pager

## 2013-08-27 NOTE — Assessment & Plan Note (Signed)
Dual-chamber Medtronic Adapta implanted January 2013 for bradycardia with alternating bundle branch block and 2:1 atrioventricular block. Reprogrammed to fixed AV delays as described above. Reevaluate in 3 months. He is enrolled in the [employer] a remote monitoring system to

## 2013-08-27 NOTE — Assessment & Plan Note (Signed)
Fair glycemic control even without medications

## 2013-08-28 ENCOUNTER — Encounter: Payer: Self-pay | Admitting: Cardiovascular Disease

## 2013-09-05 ENCOUNTER — Telehealth: Payer: Self-pay | Admitting: Gastroenterology

## 2013-09-05 NOTE — Telephone Encounter (Signed)
lmom for pt to call back

## 2013-09-05 NOTE — Telephone Encounter (Signed)
Pt needed to call Dr Jarome Matin. Pt stated understanding.

## 2013-09-10 ENCOUNTER — Telehealth: Payer: Self-pay | Admitting: Internal Medicine

## 2013-09-10 NOTE — Telephone Encounter (Signed)
Pt has remote appt 11-28-13 and recall with Dr C 08-2014/mt

## 2013-09-13 ENCOUNTER — Ambulatory Visit (INDEPENDENT_AMBULATORY_CARE_PROVIDER_SITE_OTHER): Payer: BC Managed Care – PPO | Admitting: Family Medicine

## 2013-09-13 VITALS — BP 124/82 | HR 85 | Temp 98.0°F | Wt 205.0 lb

## 2013-09-13 DIAGNOSIS — L821 Other seborrheic keratosis: Secondary | ICD-10-CM

## 2013-09-13 DIAGNOSIS — R972 Elevated prostate specific antigen [PSA]: Secondary | ICD-10-CM

## 2013-09-13 NOTE — Progress Notes (Signed)
  Subjective:    Patient ID: Kevin [name], male    DOB: [birth year], 67 y.o.   MRN: [id number]  HPI Patient came in today to discuss elevated PSA. He has seen urologist and in fact has been scheduled for biopsies. Most recent PSA 4.4. He has not had any acute obstructive symptoms. No recent burning with urination.  Also has multiple seborrheic keratoses on his back. Urologist apparently had recommend referral to [facility] dermatology. He had possible exposure to agent orange in [place].  He has questions today regarding whether skin lesions and prostate issues are related. We explained that we cannot be sure.  Past Medical History  Diagnosis Date  . Diabetes mellitus   . Chronic kidney disease     stones  . UTI (urinary tract infection)   . Peri-rectal abscess   . Sinus bradycardia   . Anemia   . Pacemaker   . Blood transfusion     "in [place]"  . Nonischemic cardiomyopathy   . History of colon polyps   . RBBB   . 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 Permanent pacemaker insertion  12/01/11    Medtronic  . Inguinal hernia repair  1980    left  . Tonsillectomy    . Shrapnel surgery      "in [place]"  . Examination under anesthesia  04/06/2012    Procedure: EXAM UNDER ANESTHESIA;  Surgeon: Maisie Fus A. Cornett, MD;  Location: [facility] OR;  Service: General;  Laterality: N/A;  . Nm myocar perf wall motion  01/21/2012    mod-severe perfusion defect due to infarct/scar w/mild perinfarct ischemia in the apical,basal inferoseptal,basal inferior,mid inferoseptal,midinferior and apical inferior regions  . Cardiac catheterization  01/27/2012    normal coronaries, dilated LV c/w nonischemic cardiomyopathy    reports that he quit smoking about 29 years ago. His smoking use included Cigarettes. He has a 20 pack-year smoking history. He has never used smokeless tobacco. He reports that he drinks alcohol. He  reports that he does not use illicit drugs. family history includes Leukemia in his father; Ovarian cancer in his mother. Allergies  Allergen Reactions  . Sulfa Antibiotics Swelling    Swelling of lips only.      Review of Systems  Constitutional: Negative for fever, chills, appetite change and unexpected weight change.  Genitourinary: Negative for dysuria and decreased urine volume.       Objective:   Physical Exam  Constitutional: He appears well-developed and well-nourished.  Cardiovascular: Normal rate and regular rhythm.   Pulmonary/Chest: Effort normal and breath sounds normal. No respiratory distress. He has no wheezes. He has no rales.  Musculoskeletal: He exhibits no edema.  Skin:  Patient has multiple scattered brown well demarcated scaly lesions on his back consistent with seborrheic keratoses          Assessment & Plan:  #1 elevated PSA. We have recommended that he follow through with prostate biopsy. Hopefully, this represents BPH #2 seborrheic keratoses, multiple. Reassured these are benign. Impractical to treat this many

## 2013-09-13 NOTE — Patient Instructions (Signed)
Check to see if [facility] has given your pneumonia vaccine

## 2013-10-13 ENCOUNTER — Other Ambulatory Visit: Payer: Self-pay | Admitting: Family Medicine

## 2013-10-15 NOTE — Telephone Encounter (Signed)
Pharmacy wants to know if they can change viagra to cialis

## 2013-10-15 NOTE — Telephone Encounter (Signed)
Refill for 6 months. 

## 2013-10-16 ENCOUNTER — Other Ambulatory Visit: Payer: Self-pay | Admitting: Family Medicine

## 2013-10-16 NOTE — Telephone Encounter (Signed)
Is it okay to change to Cialis

## 2013-10-16 NOTE — Telephone Encounter (Signed)
Yes.  Instruction for Cialis 20 mg every other day prn

## 2013-10-17 ENCOUNTER — Other Ambulatory Visit: Payer: Self-pay

## 2013-10-17 MED ORDER — TADALAFIL 20 MG PO TABS: 20.0000 mg | ORAL_TABLET | ORAL | Status: DC

## 2013-10-19 ENCOUNTER — Encounter: Payer: Self-pay | Admitting: Cardiovascular Disease

## 2013-10-24 ENCOUNTER — Ambulatory Visit (INDEPENDENT_AMBULATORY_CARE_PROVIDER_SITE_OTHER): Payer: BC Managed Care – PPO | Admitting: Family Medicine

## 2013-10-24 ENCOUNTER — Encounter: Payer: Self-pay | Admitting: Family Medicine

## 2013-10-24 ENCOUNTER — Telehealth: Payer: Self-pay

## 2013-10-24 VITALS — BP 130/72 | HR 69 | Temp 97.8°F | Wt 206.0 lb

## 2013-10-24 DIAGNOSIS — E119 Type 2 diabetes mellitus without complications: Secondary | ICD-10-CM

## 2013-10-24 NOTE — Progress Notes (Signed)
Pre visit review using our clinic review tool, if applicable. No additional management support is needed unless otherwise documented below in the visit note. 

## 2013-10-24 NOTE — Telephone Encounter (Signed)
Pt also wants you to know that when he turns his head real quick he gets dizzy.

## 2013-10-24 NOTE — Telephone Encounter (Signed)
Pt also need rx viagra sent [facility]

## 2013-10-24 NOTE — Progress Notes (Signed)
Subjective:    Patient ID: Kevin [name], male    DOB: 1946/10/11, 67 y.o.   MRN: [id number]  HPI Patient seen for followup regarding type 2 diabetes. Pt in [place] with exposure to agent Orange.  He is concerned about multiple health effects.   [facility] and [facility] had questions regarding diabetes. Last A1c on record here one year ago was 6.9%. He does not monitor sugars at home. Previously was on metformin but he was convinced he was having memory deficits related to that and he stopped on his own. Denies any symptoms of hyperglycemia.  He's had some ongoing issues of concern for possible memory deficit. We performed Mini-Mental Status exam last year and he scored 29/30. He thinks that some of his memory deficits related to stress at work. He takes Celexa and Wellbutrin. He feels depression symptoms are stable. Previous B12 and TSH were normal  Past Medical History  Diagnosis Date  . Diabetes mellitus   . Chronic kidney disease     stones  . UTI (urinary tract infection)   . Peri-rectal abscess   . Sinus bradycardia   . Anemia   . Pacemaker   . Blood transfusion     "in [place]"  . Nonischemic cardiomyopathy   . History of colon polyps   . RBBB   . 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 Permanent pacemaker insertion  12/01/11    Medtronic  . Inguinal hernia repair  1980    left  . Tonsillectomy    . Shrapnel surgery      "in [place]"  . Examination under anesthesia  04/06/2012    Procedure: EXAM UNDER ANESTHESIA;  Surgeon: Maisie Fus A. Cornett, MD;  Location: [facility] OR;  Service: General;  Laterality: N/A;  . Nm myocar perf wall motion  01/21/2012    mod-severe perfusion defect due to infarct/scar w/mild perinfarct ischemia in the apical,basal inferoseptal,basal inferior,mid inferoseptal,midinferior and apical inferior regions  . Cardiac  catheterization  01/27/2012    normal coronaries, dilated LV c/w nonischemic cardiomyopathy    reports that he quit smoking about 29 years ago. His smoking use included Cigarettes. He has a 20 pack-year smoking history. He has never used smokeless tobacco. He reports that he drinks alcohol. He reports that he does not use illicit drugs. family history includes Leukemia in his father; Ovarian cancer in his mother. Allergies  Allergen Reactions  . Sulfa Antibiotics Swelling    Swelling of lips only.      Review of Systems  Constitutional: Negative for fatigue.  Eyes: Negative for visual disturbance.  Respiratory: Negative for cough, chest tightness and shortness of breath.   Cardiovascular: Negative for chest pain, palpitations and leg swelling.  Endocrine: Negative for polydipsia and polyuria.  Neurological: Negative for dizziness, syncope, weakness, light-headedness and headaches.       Objective:   Physical Exam  Constitutional: He is oriented to person, place, and time. He appears well-developed and well-nourished.  Neck: Neck supple. No thyromegaly present.  Cardiovascular: Normal rate.   Pulmonary/Chest: Effort normal and breath sounds normal. No respiratory distress. He has no wheezes. He has no rales.  Neurological: He is alert and oriented to person, place, and time.          Assessment & Plan:  Type 2 diabetes. History of good control but not assessed we'll A1c  and one year. Currently off medication. Repeat A1c. 3 hour postprandial blood sugar 180.  Concern for memory deficits. 3 word recall two out of three. Other cognitive screening normal

## 2013-10-25 ENCOUNTER — Other Ambulatory Visit: Payer: Self-pay

## 2013-10-25 MED ORDER — SILDENAFIL CITRATE 100 MG PO TABS: 100.0000 mg | ORAL_TABLET | ORAL | Status: DC | PRN

## 2013-10-25 NOTE — Telephone Encounter (Signed)
RX sent to pharmacy  

## 2013-10-25 NOTE — Telephone Encounter (Signed)
OK to refill Viagra for one year.  Dizziness likely benign vertigo.  Observe unless symptoms become more severe.

## 2013-10-31 ENCOUNTER — Encounter (INDEPENDENT_AMBULATORY_CARE_PROVIDER_SITE_OTHER): Payer: Self-pay

## 2013-10-31 ENCOUNTER — Encounter: Payer: BC Managed Care – PPO | Admitting: Cardiovascular Disease

## 2013-11-06 ENCOUNTER — Telehealth: Payer: Self-pay | Admitting: Family Medicine
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
## 2013-11-06 NOTE — Telephone Encounter (Signed)
Left message for patient to return call.
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
## 2013-11-07 ENCOUNTER — Other Ambulatory Visit (INDEPENDENT_AMBULATORY_CARE_PROVIDER_SITE_OTHER): Payer: BC Managed Care – PPO

## 2013-11-07 DIAGNOSIS — IMO0001 Reserved for inherently not codable concepts without codable children: Secondary | ICD-10-CM

## 2013-11-07 LAB — HEMOGLOBIN A1C: Hgb A1c MFr Bld: 8.1 % — ABNORMAL HIGH (ref 4.6–6.5)

## 2013-11-07 NOTE — Telephone Encounter (Signed)
Pt just stopped by to notify you that he no longer needs the letter.

## 2013-11-13 ENCOUNTER — Other Ambulatory Visit: Payer: Self-pay

## 2013-11-13 MED ORDER — METFORMIN HCL 500 MG PO TABS: 500.0000 mg | ORAL_TABLET | Freq: Two times a day (BID) | ORAL | Status: DC

## 2013-11-20 ENCOUNTER — Ambulatory Visit (INDEPENDENT_AMBULATORY_CARE_PROVIDER_SITE_OTHER): Payer: BC Managed Care – PPO | Admitting: Cardiovascular Disease

## 2013-11-20 ENCOUNTER — Encounter: Payer: Self-pay | Admitting: Cardiovascular Disease

## 2013-11-20 VITALS — BP 90/50 | HR 90 | Ht 75.0 in | Wt 199.0 lb

## 2013-11-20 DIAGNOSIS — I429 Cardiomyopathy, unspecified: Secondary | ICD-10-CM

## 2013-11-20 DIAGNOSIS — R001 Bradycardia, unspecified: Secondary | ICD-10-CM

## 2013-11-20 DIAGNOSIS — I498 Other specified cardiac arrhythmias: Secondary | ICD-10-CM

## 2013-11-20 DIAGNOSIS — I472 Ventricular tachycardia, unspecified: Secondary | ICD-10-CM

## 2013-11-20 DIAGNOSIS — Z950 Presence of cardiac pacemaker: Secondary | ICD-10-CM

## 2013-11-20 DIAGNOSIS — I428 Other cardiomyopathies: Secondary | ICD-10-CM

## 2013-11-20 DIAGNOSIS — I4729 Other ventricular tachycardia: Secondary | ICD-10-CM

## 2013-11-20 LAB — MDC_IDC_ENUM_SESS_TYPE_INCLINIC
Battery Remaining Longevity: 11.5
Battery Voltage: 2.78 V
Brady Statistic AP VS Percent: 0.3 %
Brady Statistic AS VP Percent: 88.1 %
Brady Statistic AS VS Percent: 9.8 %
Lead Channel Pacing Threshold Amplitude: 0.5 V
Lead Channel Pacing Threshold Amplitude: 0.75 V
Lead Channel Pacing Threshold Pulse Width: 0.4 ms
Lead Channel Sensing Intrinsic Amplitude: 11.2 mV
Lead Channel Sensing Intrinsic Amplitude: 5.6 mV
Lead Channel Setting Pacing Amplitude: 1.5 V
Lead Channel Setting Pacing Pulse Width: 0.4 ms
MDC IDC MSMT BATTERY IMPEDANCE: 137 Ohm
MDC IDC MSMT LEADCHNL RA IMPEDANCE VALUE: 455 Ohm
MDC IDC MSMT LEADCHNL RV IMPEDANCE VALUE: 448 Ohm
MDC IDC MSMT LEADCHNL RV PACING THRESHOLD PULSEWIDTH: 0.4 ms
MDC IDC SET LEADCHNL RV PACING AMPLITUDE: 2 V
MDC IDC SET LEADCHNL RV SENSING SENSITIVITY: 2.8 mV
MDC IDC STAT BRADY AP VP PERCENT: 1.8 %

## 2013-11-20 LAB — PACEMAKER DEVICE OBSERVATION

## 2013-11-20 NOTE — Patient Instructions (Addendum)
Your physician has requested that you have an echocardiogram. Echocardiography is a painless test that uses sound waves to create images of your heart. It provides your doctor with information about the size and shape of your heart and how well your heart's chambers and valves are working. This procedure takes approximately one hour. There are no restrictions for this procedure.  Your physician recommends that you schedule a follow-up appointment after the Echo.

## 2013-11-23 DIAGNOSIS — I472 Ventricular tachycardia, unspecified: Secondary | ICD-10-CM | POA: Insufficient documentation

## 2013-11-23 DIAGNOSIS — I428 Other cardiomyopathies: Secondary | ICD-10-CM | POA: Insufficient documentation

## 2013-11-23 NOTE — Assessment & Plan Note (Signed)
The episodes were asymptomatic and nonsustained, but at least one episode was more concerning due to its duration. I think in view of the confusion related to the cause of his cardiomyopathy and the broad swings in LVEF it is reasonable to repeat an echocardiogram.

## 2013-11-23 NOTE — Assessment & Plan Note (Signed)
Normal device function. 90% ventricular pacing. The previous programming with [id number] did not lead to any reduction in this frequency ventricular pacing presents is in fact was 100% ventricular paced at that time). Excellent lead and battery parameters. Normal heart rate histogram distribution (he rarely requires atrial pacing). No changes are made to device programming. In the absence of clinical heart failure, especially if the repeat echocardiogram confirms normalization of left ventricular systolic function, he does not require an echo resynchronization therapy.
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
## 2013-11-23 NOTE — Assessment & Plan Note (Addendum)
The broad variations in left ventricular systolic function are extremely puzzling. He does not have coronary disease. It is unlikely that the asynchrony related to native conduction abnormality and/or ventricular pacing could cause this degree of variation left ventricular systolic function. Clinically he behaves more like a patient with normal left ventricular function. In addition, normal LVEF was documented in the setting of active ventricular pacing at his last echocardiogram. An abnormal electrocardiogram has been documented at least 4 years ago and was interpreted as showing right bundle branch block. The pacemaker was implanted when he showed evidence of alternating right and left bundle-branch block as well as second degree AV block. The echocardiogram performed in August of 2013, showing native AV conduction is rather unusual and shows a surprisingly short PR interval it is the only electrocardiogram with a narrow QRS and looks different from all other tracings in the chart. Unusual event such as painless myocarditis or painless stress cardiomyopathy are considered. They could explain the transient nature of left ventricular systolic dysfunction, but the presentation would be highly atypical. It is even harder to explain the appearance of extensive perfusion abnormalities on his nuclear stress test and the presence of regional wall motion abnormalities on his left ventriculogram. At this point he is on appropriate beta blocker and ACE inhibitor therapy in high doses. He does not require diuretic therapy and appears euvolemic. NYHA functional class I.

## 2013-12-04 ENCOUNTER — Ambulatory Visit (HOSPITAL_COMMUNITY)
Admission: RE | Admit: 2013-12-04 | Discharge: 2013-12-04 | Disposition: A | Discharge status: Home / Self Care | Payer: BC Managed Care – PPO | Source: Ambulatory Visit | Attending: Cardiology | Admitting: Cardiology

## 2013-12-04 DIAGNOSIS — I498 Other specified cardiac arrhythmias: Secondary | ICD-10-CM | POA: Insufficient documentation

## 2013-12-04 DIAGNOSIS — I059 Rheumatic mitral valve disease, unspecified: Secondary | ICD-10-CM | POA: Insufficient documentation

## 2013-12-04 DIAGNOSIS — R001 Bradycardia, unspecified: Secondary | ICD-10-CM

## 2013-12-04 DIAGNOSIS — I428 Other cardiomyopathies: Secondary | ICD-10-CM | POA: Insufficient documentation

## 2013-12-04 DIAGNOSIS — I517 Cardiomegaly: Secondary | ICD-10-CM | POA: Insufficient documentation

## 2013-12-04 NOTE — Progress Notes (Signed)
2D Echo Performed 12/04/2013    Kevante Lunt, RCS  

## 2013-12-06 ENCOUNTER — Telehealth: Payer: Self-pay | Admitting: Cardiovascular Disease

## 2013-12-06 NOTE — Telephone Encounter (Signed)
Echo results given to patient.  Voiced understanding.

## 2013-12-06 NOTE — Telephone Encounter (Signed)
Pt called again-said he was returning somebody call about his test results.

## 2013-12-06 NOTE — Telephone Encounter (Signed)
Returning call about echo  results .Marland Kitchen Please call    Thanks

## 2013-12-11 ENCOUNTER — Encounter: Payer: BC Managed Care – PPO | Admitting: Cardiovascular Disease

## 2013-12-17 ENCOUNTER — Other Ambulatory Visit: Payer: Self-pay | Admitting: Family Medicine

## 2013-12-17 ENCOUNTER — Other Ambulatory Visit: Payer: Self-pay

## 2013-12-17 MED ORDER — CARVEDILOL 6.25 MG PO TABS: 6.2500 mg | ORAL_TABLET | Freq: Two times a day (BID) | ORAL | Status: DC

## 2013-12-17 NOTE — Telephone Encounter (Signed)
Rx was sent to pharmacy electronically. 

## 2013-12-19 ENCOUNTER — Telehealth: Payer: Self-pay | Admitting: Family Medicine

## 2013-12-19 NOTE — Telephone Encounter (Signed)
Spoke with patient, he is thrilled.  Will have fiance call in today to make an appointment.

## 2013-12-19 NOTE — Telephone Encounter (Signed)
Pt would like to know if you will accept his fiance' Any Humley as a new patient, he would greatly appreciate it, he has a lot of faith in you.  If not, please recommend someone else here who you think would be a good fit for her, between Dr. Maudie Mercury, Abby Potash or Dr. Regis Bill. Please advise.

## 2013-12-19 NOTE — Telephone Encounter (Signed)
i will see

## 2013-12-28 ENCOUNTER — Telehealth: Payer: Self-pay | Admitting: Family Medicine

## 2013-12-28 NOTE — Telephone Encounter (Addendum)
Pt has rectal peri-rectal abcess in 2013. Pt states it is coming back  Dr Elease Hashimoto sent pt to dr Mitzie Na, [place] [facility]. Pt not sure if he needs referral, but needs to go back to this surgeon.Kevin [name]

## 2013-12-31 NOTE — Telephone Encounter (Signed)
Left message for patient to return call.

## 2013-12-31 NOTE — Telephone Encounter (Signed)
Pt informed per Dr. Ward Givens that he should go see Dr. Marcello Moores

## 2014-01-07 ENCOUNTER — Ambulatory Visit (INDEPENDENT_AMBULATORY_CARE_PROVIDER_SITE_OTHER): Payer: Medicare Other | Admitting: Surgery

## 2014-01-07 ENCOUNTER — Encounter (INDEPENDENT_AMBULATORY_CARE_PROVIDER_SITE_OTHER): Payer: Self-pay | Admitting: Surgery

## 2014-01-07 DIAGNOSIS — Z8719 Personal history of other diseases of the digestive system: Secondary | ICD-10-CM

## 2014-01-07 DIAGNOSIS — Z872 Personal history of diseases of the skin and subcutaneous tissue: Secondary | ICD-10-CM

## 2014-01-07 NOTE — Progress Notes (Signed)
Subjective:     Patient ID: Kevin [name], male   DOB: [birth year], 68 y.o.   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 He had rectal drainage about 6 weeks ago that was purulent. This has resolved.  No rectal bleeding,  Pain  Or drainage.  He is now retired.  Review of Systems    Constitutional: Negative.   Gastrointestinal: Negative for anal bleeding and rectal pain.  Musculoskeletal: Negative.        Objective:   Physical Exam  Constitutional: He appears well-developed and well-nourished.  Neck: Normal range of motion. Neck supple.  Genitourinary: Rectal exam shows external hemorrhoid. Rectal exam shows no internal hemorrhoid, no fissure, no mass and no tenderness.             Assessment:     History of rectal abscess resolved     Plan:      Follow up as needed.

## 2014-01-07 NOTE — Patient Instructions (Signed)
Return as needed

## 2014-01-11 ENCOUNTER — Ambulatory Visit (INDEPENDENT_AMBULATORY_CARE_PROVIDER_SITE_OTHER): Payer: BC Managed Care – PPO | Admitting: Family Medicine

## 2014-01-11 VITALS — BP 130/68 | HR 79 | Temp 97.8°F | Wt 207.0 lb

## 2014-01-11 DIAGNOSIS — J209 Acute bronchitis, unspecified: Secondary | ICD-10-CM

## 2014-01-11 MED ORDER — HYDROCODONE-HOMATROPINE 5-1.5 MG/5ML PO SYRP: 5.0000 mL | ORAL_SOLUTION | Freq: Four times a day (QID) | ORAL | Status: AC | PRN

## 2014-01-11 NOTE — Patient Instructions (Signed)
Acute Bronchitis Bronchitis is inflammation of the airways that extend from the windpipe into the lungs (bronchi). The inflammation often causes mucus to develop. This leads to a cough, which is the most common symptom of bronchitis.  In acute bronchitis, the condition usually develops suddenly and goes away over time, usually in a couple weeks. Smoking, allergies, and asthma can make bronchitis worse. Repeated episodes of bronchitis may cause further lung problems.  CAUSES Acute bronchitis is most often caused by the same virus that causes a cold. The virus can spread from person to person (contagious).  SIGNS AND SYMPTOMS   Cough.   Fever.   Coughing up mucus.   Body aches.   Chest congestion.   Chills.   Shortness of breath.   Sore throat.  DIAGNOSIS  Acute bronchitis is usually diagnosed through a physical exam. Tests, such as chest X-rays, are sometimes done to rule out other conditions.  TREATMENT  Acute bronchitis usually goes away in a couple weeks. Often times, no medical treatment is necessary. Medicines are sometimes given for relief of fever or cough. Antibiotics are usually not needed but may be prescribed in certain situations. In some cases, an inhaler may be recommended to help reduce shortness of breath and control the cough. A cool mist vaporizer may also be used to help thin bronchial secretions and make it easier to clear the chest.  HOME CARE INSTRUCTIONS  Get plenty of rest.   Drink enough fluids to keep your urine clear or pale yellow (unless you have a medical condition that requires fluid restriction). Increasing fluids may help thin your secretions and will prevent dehydration.   Only take over-the-counter or prescription medicines as directed by your health care provider.   Avoid smoking and secondhand smoke. Exposure to cigarette smoke or irritating chemicals will make bronchitis worse. If you are a smoker, consider using nicotine gum or skin  patches to help control withdrawal symptoms. Quitting smoking will help your lungs heal faster.   Reduce the chances of another bout of acute bronchitis by washing your hands frequently, avoiding people with cold symptoms, and trying not to touch your hands to your mouth, nose, or eyes.   Follow up with your health care provider as directed.  SEEK MEDICAL CARE IF: Your symptoms do not improve after 1 week of treatment.  SEEK IMMEDIATE MEDICAL CARE IF:  You develop an increased fever or chills.   You have chest pain.   You have severe shortness of breath.  You have bloody sputum.   You develop dehydration.  You develop fainting.  You develop repeated vomiting.  You develop a severe headache. MAKE SURE YOU:   Understand these instructions.  Will watch your condition.  Will get help right away if you are not doing well or get worse. Document Released: 12/02/2004 Document Revised: 06/27/2013 Document Reviewed: 04/17/2013 [employer] Patient Information 2014 [employer].  

## 2014-01-11 NOTE — Progress Notes (Signed)
Pre visit review using our clinic review tool, if applicable. No additional management support is needed unless otherwise documented below in the visit note. 

## 2014-01-11 NOTE — Progress Notes (Signed)
   Subjective:    Patient ID: Kevin [name], male    DOB: [birth year], 68 y.o.   MRN: [id number]  Cough Pertinent negatives include no chills or fever.   Acute visit. Patient developed cough this past Sunday. He's had some nasal congestion. No body aches. No fevers or chills. No dyspnea. Still exercising 3 days this week without difficulty. Cough is worse at night. He has not tried any medications for this. Nonsmoker.   Past Medical History  Diagnosis Date  . Diabetes mellitus   . Chronic kidney disease     stones  . UTI (urinary tract infection)   . Peri-rectal abscess   . Sinus bradycardia   . Anemia   . Pacemaker   . Blood transfusion     "in [place]"  . Nonischemic cardiomyopathy   . History of colon polyps   . RBBB   . 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 Permanent pacemaker insertion  12/01/11    [employer]tronic  . Inguinal hernia repair  1980    left  . Tonsillectomy    . Shrapnel surgery      "in [place]"  . Examination under anesthesia  04/06/2012    Procedure: EXAM UNDER ANESTHESIA;  Surgeon: Marcello Moores A. Cornett, MD;  Location: [place];  Service: General;  Laterality: N/A;  . Nm myocar perf wall motion  01/21/2012    mod-severe perfusion defect due to infarct/scar w/mild perinfarct ischemia in the apical,basal inferoseptal,basal inferior,mid inferoseptal,midinferior and apical inferior regions  . Cardiac catheterization  01/27/2012    normal coronaries, dilated LV c/w nonischemic cardiomyopathy    reports that he quit smoking about 29 years ago. His smoking use included Cigarettes. He has a 20 pack-year smoking history. He has never used smokeless tobacco. He reports that he drinks alcohol. He reports that he does not use illicit drugs. family history includes Leukemia in his father; Ovarian cancer in his mother. Allergies  Allergen Reactions  . Sulfa Antibiotics Swelling   Swelling of lips only.      Review of Systems  Constitutional: Negative for fever and chills.  HENT: Positive for congestion.   Respiratory: Positive for cough.   Neurological: Negative for dizziness and syncope.       Objective:   Physical Exam  Constitutional: He appears well-developed and well-nourished.  HENT:  Right Ear: External ear normal.  Left Ear: External ear normal.  Mouth/Throat: Oropharynx is clear and moist.  Neck: Neck supple.  Cardiovascular: Normal rate.   Pulmonary/Chest: Effort normal and breath sounds normal. No respiratory distress. He has no wheezes. He has no rales.  Musculoskeletal: He exhibits no edema.          Assessment & Plan:  Cough. Suspect acute viral bronchitis. No indication for antibiotics. Hycodan cough syrup 1 teaspoon each bedtime as needed. Followup pronptly for fever or worsening symptoms

## 2014-02-12 ENCOUNTER — Encounter: Payer: Self-pay | Admitting: Cardiovascular Disease

## 2014-02-12 ENCOUNTER — Telehealth: Payer: Self-pay | Admitting: Cardiovascular Disease

## 2014-02-26 NOTE — Telephone Encounter (Signed)
Encounter opened in error by operator.

## 2014-02-28 NOTE — Telephone Encounter (Signed)
Closed encounter °

## 2014-03-04 ENCOUNTER — Encounter: Payer: Self-pay | Admitting: Family Medicine

## 2014-03-04 ENCOUNTER — Ambulatory Visit (INDEPENDENT_AMBULATORY_CARE_PROVIDER_SITE_OTHER): Payer: Medicare Other | Admitting: Family Medicine

## 2014-03-04 VITALS — BP 130/74 | HR 75 | Temp 97.8°F | Wt 200.0 lb

## 2014-03-04 DIAGNOSIS — R05 Cough: Secondary | ICD-10-CM

## 2014-03-04 DIAGNOSIS — R229 Localized swelling, mass and lump, unspecified: Secondary | ICD-10-CM

## 2014-03-04 DIAGNOSIS — R059 Cough, unspecified: Secondary | ICD-10-CM

## 2014-03-04 DIAGNOSIS — R053 Chronic cough: Secondary | ICD-10-CM

## 2014-03-04 MED ORDER — LOSARTAN POTASSIUM 100 MG PO TABS: 100.0000 mg | ORAL_TABLET | Freq: Every day | ORAL | Status: DC

## 2014-03-04 NOTE — Progress Notes (Signed)
Pre visit review using our clinic review tool, if applicable. No additional management support is needed unless otherwise documented below in the visit note. 

## 2014-03-04 NOTE — Progress Notes (Signed)
Subjective:    Patient ID: Kevin [name], male    DOB: [birth year], 68 y.o.   MRN: [id number]  Cough Pertinent negatives include no chest pain, chills, fever, shortness of breath or wheezing.   Patient is seen with persistent cough. He was seen back in March with diagnosis of presumed viral bronchitis. He relates about 2 month history of dry cough. No hemoptysis. No appetite or weight changes. No fevers or chills. No dyspnea. Only rare GERD symptoms. No postnasal drip symptoms. Patient quit smoking back in 1985. Does take ACE inhibitor with lisinopril but has been on this for several years. No clear exacerbating factors. He was briefly on Hycodan cough syrup which helped somewhat  Patient has a couple of nodular lesions including left wrist and right forearm. Has remote history of skin cancer on face. Has tried treating these lesions with liquid nitrogen at home without much improvement but just started treatment last week.  Past Medical History  Diagnosis Date  . Diabetes mellitus   . Chronic kidney disease     stones  . UTI (urinary tract infection)   . Peri-rectal abscess   . Sinus bradycardia   . Anemia   . Pacemaker   . Blood transfusion     "in [place]"  . Nonischemic cardiomyopathy   . History of colon polyps   . RBBB   . 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 Permanent pacemaker insertion  12/01/11    Medtronic  . Inguinal hernia repair  1980    left  . Tonsillectomy    . Shrapnel surgery      "in [place]"  . Examination under anesthesia  04/06/2012    Procedure: EXAM UNDER ANESTHESIA;  Surgeon: Marcello Moores A. Cornett, MD;  Location: [facility];  Service: General;  Laterality: N/A;  . Nm myocar perf wall motion  01/21/2012    mod-severe perfusion defect due to infarct/scar w/mild perinfarct ischemia in the apical,basal inferoseptal,basal inferior,mid inferoseptal,midinferior and apical  inferior regions  . Cardiac catheterization  01/27/2012    normal coronaries, dilated LV c/w nonischemic cardiomyopathy    reports that he quit smoking about 29 years ago. His smoking use included Cigarettes. He has a 20 pack-year smoking history. He has never used smokeless tobacco. He reports that he drinks alcohol. He reports that he does not use illicit drugs. family history includes Leukemia in his father; Ovarian cancer in his mother. Allergies  Allergen Reactions  . Sulfa Antibiotics Swelling    Swelling of lips only.      Review of Systems  Constitutional: Negative for fever, chills, appetite change and unexpected weight change.  Respiratory: Positive for cough. Negative for shortness of breath and wheezing.   Cardiovascular: Negative for chest pain, palpitations and leg swelling.  Gastrointestinal: Negative for abdominal pain.  Neurological: Negative for dizziness.       Objective:   Physical Exam  Constitutional: He appears well-developed and well-nourished.  Neck: Neck supple. No thyromegaly present.  Cardiovascular: Normal rate.   Pulmonary/Chest: Effort normal and breath sounds normal. No respiratory distress. He has no wheezes. He has no rales.  Musculoskeletal: He exhibits no edema.  Skin:  Patient has hyperkeratotic thickened nodular lesion dorsum left wrist. This is about 6 mm diameter. Right forearm dorsally reveals scaly slightly nodular lesion which is about 8 mm diameter. Well-demarcated borders  Psychiatric: He has a normal mood and affect.  His behavior is normal.          Assessment & Plan:   #1 chronic dry cough. Question ACE inhibitor related. Nonfocal exam. Stop lisinopril. Start losartan 100 mg once daily. Touch base 2 weeks if cough not fully resolved. Consider chest x-ray that time if cough still present. #2 nodular skin lesions right forearm and dorsum left wrist. Rule out squamous cell carcinoma. With his prior history of skin cancer we'll set up  referral to [facility] for further evaluation

## 2014-03-04 NOTE — Patient Instructions (Signed)
STOP lisinopril and start Losartan 100 mg once daily Touch base in 1-2 weeks if cough not resolving We will call you regarding dermatology appointment.

## 2014-03-07 ENCOUNTER — Telehealth: Payer: Self-pay | Admitting: Cardiovascular Disease

## 2014-03-15 NOTE — Telephone Encounter (Signed)
Closed encounter °

## 2014-03-19 ENCOUNTER — Ambulatory Visit (INDEPENDENT_AMBULATORY_CARE_PROVIDER_SITE_OTHER): Payer: Medicare Other | Admitting: Cardiovascular Disease

## 2014-03-19 ENCOUNTER — Encounter: Payer: Self-pay | Admitting: Cardiovascular Disease

## 2014-03-19 VITALS — BP 116/72 | HR 73 | Ht 75.0 in | Wt 196.8 lb

## 2014-03-19 DIAGNOSIS — I429 Cardiomyopathy, unspecified: Secondary | ICD-10-CM

## 2014-03-19 DIAGNOSIS — I442 Atrioventricular block, complete: Secondary | ICD-10-CM

## 2014-03-19 DIAGNOSIS — I428 Other cardiomyopathies: Secondary | ICD-10-CM

## 2014-03-19 DIAGNOSIS — I472 Ventricular tachycardia, unspecified: Secondary | ICD-10-CM

## 2014-03-19 DIAGNOSIS — Z950 Presence of cardiac pacemaker: Secondary | ICD-10-CM

## 2014-03-19 DIAGNOSIS — I4729 Other ventricular tachycardia: Secondary | ICD-10-CM

## 2014-03-19 LAB — PACEMAKER DEVICE OBSERVATION

## 2014-03-19 NOTE — Patient Instructions (Signed)
Remote monitoring is used to monitor your Pacemaker or ICD from home. This monitoring reduces the number of office visits required to check your device to one time per year. It allows [place] to monitor the functioning of your device to ensure it is working properly. You are scheduled for a device check from home on June 21, 2014. You may send your transmission at any time that day. If you have a wireless device, the transmission will be sent automatically. After your physician reviews your transmission, you will receive a postcard with your next transmission date.  Dr. Sallyanne Kuster recommends that you schedule a follow-up appointment in: ONE YEAR.

## 2014-03-23 LAB — MDC_IDC_ENUM_SESS_TYPE_INCLINIC
Brady Statistic AP VP Percent: 1 %
Brady Statistic AP VS Percent: 0 %
Brady Statistic AS VP Percent: 80 %
Brady Statistic AS VS Percent: 19 %
Lead Channel Impedance Value: 467 Ohm
Lead Channel Pacing Threshold Amplitude: 0.5 V
Lead Channel Pacing Threshold Amplitude: 0.5 V
Lead Channel Sensing Intrinsic Amplitude: 11.2 mV
Lead Channel Sensing Intrinsic Amplitude: 4 mV
Lead Channel Setting Pacing Amplitude: 2 V
Lead Channel Setting Pacing Pulse Width: 0.4 ms
Lead Channel Setting Sensing Sensitivity: 2.8 mV
MDC IDC MSMT BATTERY IMPEDANCE: 185 Ohm
MDC IDC MSMT BATTERY REMAINING LONGEVITY: 132 mo
MDC IDC MSMT BATTERY VOLTAGE: 2.78 V
MDC IDC MSMT LEADCHNL RA IMPEDANCE VALUE: 481 Ohm
MDC IDC MSMT LEADCHNL RA PACING THRESHOLD PULSEWIDTH: 0.4 ms
MDC IDC MSMT LEADCHNL RV PACING THRESHOLD PULSEWIDTH: 0.4 ms
MDC IDC SESS DTM: 20150512142854
MDC IDC SET LEADCHNL RA PACING AMPLITUDE: 1.5 V

## 2014-03-24 NOTE — Progress Notes (Signed)
Patient ID: Kevin [name], male   DOB: 08/26/1946, 68 y.o.   MRN: [id number]      Reason for office visit Cardiomyopathy, pacemaker  [name] returns for a pacemaker check and clinical followup for cardiomyopathy. He is physically active and asymptomatic. His pacemaker function is normal, with 80% V pacing despite a very long AV delay. No further episodes of NSVT since last check in January.  His device is a dual-chamber pacemaker implanted in 2013 for alternating left and right bundle-branch block and intermittent second-degree atrioventricular block. He also has a history of diet controlled diabetes mellitus, HTN, erectile dysfunction and hyperlipidemia.   He underwent coronary angiography in 2013 that showed no evidence of coronary stenoses. Despite this he had a dilated left ventricle with extensive areas of wall motion abnormality and an ejection fraction of 25%. Similarly his left ventricular ejection fraction was estimated to be 24% via nuclear scintigraphy. That study was interpreted as showing scar with peri-infarct ischemia in the inferior inferoseptal and apical distribution which was new when compared to a study from 2010. The nuclear stress test and cardiac catheterization were performed subsequent to pacemaker implantation. It is possible that the pacing induced asynchrony may have been partly responsible for LV dysfunction and/or perfusion artifacts.   To make this situation even more puzzling a followup echocardiogram performed in September of 2014 showed a left ventricular ejection fraction that was virtually normal at 50-55%. The report specifically states that the electrocardiogram showed active ventricular pacing at the time of the study but also states that there was "no intraventricular dyssynchrony seen. There were no signs of elevated filling pressures. I went back and reviewed all his studies, including a new one in January 2015. I think his LVEF is around 40%. Other  than conduction related abnormalities, I see no regional variation in contractility.     Allergies  Allergen Reactions  . Sulfa Antibiotics Swelling    Swelling of lips only.    Current Outpatient Prescriptions  Medication Sig Dispense Refill  . aspirin 81 MG tablet Take 81 mg by mouth daily.        Marland Kitchen buPROPion (WELLBUTRIN XL) 150 MG 24 hr tablet TAKE ONE TABLET BY MOUTH EVERY DAY  30 tablet  5  . carvedilol (COREG) 6.25 MG tablet Take 1 tablet (6.25 mg total) by mouth 2 (two) times daily with a meal.  180 tablet  3  . citalopram (CELEXA) 20 MG tablet Take 20 mg by mouth daily.        . clobetasol ointment (TEMOVATE) 3.71 % Apply 1 application topically as needed.       . latanoprost (XALATAN) 0.005 % ophthalmic solution Place 1 drop into the right eye daily.       . Loratadine 10 MG CAPS Take 10 mg by mouth daily.       Marland Kitchen losartan (COZAAR) 100 MG tablet Take 1 tablet (100 mg total) by mouth daily.  90 tablet  3  . ondansetron (ZOFRAN) 4 MG tablet Take 1 tablet (4 mg total) by mouth every 8 (eight) hours as needed for nausea.  30 tablet  0  . simvastatin (ZOCOR) 40 MG tablet Take 40 mg by mouth daily at 6 PM.       . traZODone (DESYREL) 50 MG tablet Take 50 mg by mouth at bedtime as needed.       . triamcinolone cream (KENALOG) 0.1 % as needed.       No current facility-administered medications for  this visit.    Past Medical History  Diagnosis Date  . Diabetes mellitus   . Chronic kidney disease     stones  . UTI (urinary tract infection)   . Peri-rectal abscess   . Sinus bradycardia   . Anemia   . Pacemaker   . Blood transfusion     "in [place]"  . Nonischemic cardiomyopathy   . History of colon polyps   . RBBB   . Heart block AV second degree     permanent pacemaker 12/01/11 Medtronic    Past Surgical History  Procedure Laterality Date  . Permanent pacemaker insertion  12/01/11    [employer]tronic  . Inguinal hernia repair  1980    left  . Tonsillectomy    . Shrapnel  surgery      "in [place]"  . Examination under anesthesia  04/06/2012    Procedure: EXAM UNDER ANESTHESIA;  Surgeon: Marcello Moores A. Cornett, MD;  Location: [facility];  Service: General;  Laterality: N/A;  . Nm myocar perf wall motion  01/21/2012    mod-severe perfusion defect due to infarct/scar w/mild perinfarct ischemia in the apical,basal inferoseptal,basal inferior,mid inferoseptal,midinferior and apical inferior regions  . Cardiac catheterization  01/27/2012    normal coronaries, dilated LV c/w nonischemic cardiomyopathy    Family History  Problem Relation Age of Onset  . Ovarian cancer Mother   . Leukemia Father     History   Social History  . Marital Status: Married    Spouse Name: N/A    Number of Children: 0  . Years of Education: N/A   Occupational History  . [employer] banker    Social History Main Topics  . Smoking status: Former Smoker -- 1.00 packs/day for 20 years    Types: Cigarettes    Quit date: 04/13/1984  . Smokeless tobacco: Never Used  . Alcohol Use: Yes     Comment: 12/01/11 "maybe 3 beers or glasses of wine /month"  . Drug Use: No  . Sexual Activity: Not on file   Other Topics Concern  . Not on file   Social History Narrative  . No narrative on file    Review of systems: The patient specifically denies any chest pain at rest or with exertion, dyspnea at rest or with exertion, orthopnea, paroxysmal nocturnal dyspnea, syncope, palpitations, focal neurological deficits, intermittent claudication, lower extremity edema, unexplained weight gain, cough, hemoptysis or wheezing.  The patient also denies abdominal pain, nausea, vomiting, dysphagia, diarrhea, constipation, polyuria, polydipsia, dysuria, hematuria, frequency, urgency, abnormal bleeding or bruising, fever, chills, unexpected weight changes, mood swings, change in skin or hair texture, change in voice quality, auditory or visual problems, allergic reactions or rashes, new musculoskeletal complaints other  than usual "aches and pains".   PHYSICAL EXAM BP 116/72  Pulse 73  Ht 6\' 3"  (1.610 m)  Wt 196 lb 12.8 oz (89.268 kg)  BMI 24.60 kg/m2 General: Alert, oriented x3, no distress  Head: no evidence of trauma, PERRL, EOMI, no exophtalmos or lid lag, no myxedema, no xanthelasma; normal ears, nose and oropharynx  Neck: normal jugular venous pulsations and no hepatojugular reflux; brisk carotid pulses without delay and no carotid bruits  Chest: clear to auscultation, no signs of consolidation by percussion or palpation, normal fremitus, symmetrical and full respiratory excursions  Cardiovascular: normal position and quality of the apical impulse, regular rhythm, normal first and second heart sounds, no murmurs, rubs or gallops  Abdomen: no tenderness or distention, no masses by palpation, no abnormal pulsatility  or arterial bruits, normal bowel sounds, no hepatosplenomegaly  Extremities: no clubbing, cyanosis or edema; 2+ radial, ulnar and brachial pulses bilaterally; 2+ right femoral, posterior tibial and dorsalis pedis pulses; 2+ left femoral, posterior tibial and dorsalis pedis pulses; no subclavian or femoral bruits  Neurological: grossly nonfocal   EKG: A  Sensed (sinus) V paced with a long AV delay  BMET    Component Value Date/Time   NA 139 04/05/2012 0835   K 5.3* 04/05/2012 0835   CL 103 04/05/2012 0835   CO2 28 04/05/2012 0835   GLUCOSE 150* 04/05/2012 0835   BUN 13 04/05/2012 0835   CREATININE 0.79 04/05/2012 0835   CALCIUM 9.4 04/05/2012 0835   GFRNONAA >90 04/05/2012 0835   GFRAA >90 04/05/2012 0835     ASSESSMENT AND PLAN  [name] has a nonischemic CMP of uncertain etiology with moderately depressed LVEF (40%) and advanced AV block, requiring a very high percentage of V pacing. He is very well compensated (NYHA class I without diuretic therapy). If clinical symptoms of CHF occur, there is an option for BiV pacemaker upgrade. Meanwhile continue beta blocker/ARB therapy and  periodic pacemaker check for recurrent arrhythmia.  Orders Placed This Encounter  Procedures  . Implantable device check  . EKG 12-Lead   No orders of the defined types were placed in this encounter.    Geonna Lockyer  Sanda Klein, MD, [facility] [phone] office [phone] pager

## 2014-03-26 ENCOUNTER — Telehealth: Payer: Self-pay | Admitting: Family Medicine

## 2014-03-26 MED ORDER — GLUCOSE BLOOD VI STRP: ORAL_STRIP | Status: DC

## 2014-03-26 MED ORDER — ACCU-CHEK COMPACT PLUS CARE KIT: PACK | Status: DC

## 2014-03-26 MED ORDER — ACCU-CHEK SOFTCLIX LANCETS MISC: Status: DC

## 2014-03-26 NOTE — Telephone Encounter (Signed)
Rx sent to pharmacy   

## 2014-03-26 NOTE — Telephone Encounter (Signed)
Pt is needing new rx for a accu check compound plus, pt states with a prescription his insurance will pay for it. Pt states if there is any additional questions please call him. Send to [facility] on [address]

## 2014-04-02 ENCOUNTER — Telehealth: Payer: Self-pay | Admitting: Family Medicine

## 2014-04-02 DIAGNOSIS — E119 Type 2 diabetes mellitus without complications: Secondary | ICD-10-CM

## 2014-04-02 NOTE — Telephone Encounter (Signed)
Pt needs referral to see dr Dwyane Dee [phone] for dm. Pt has bcbs

## 2014-04-02 NOTE — Telephone Encounter (Signed)
We can refer although we have not followed up A1c in several months and can suggest things for better control.

## 2014-04-02 NOTE — Telephone Encounter (Signed)
Referral is ordered

## 2014-04-15 ENCOUNTER — Encounter: Payer: Self-pay | Admitting: Endocrinology

## 2014-04-15 ENCOUNTER — Ambulatory Visit (INDEPENDENT_AMBULATORY_CARE_PROVIDER_SITE_OTHER): Payer: Medicare Other | Admitting: Endocrinology

## 2014-04-15 VITALS — BP 130/82 | HR 77 | Temp 98.3°F | Resp 14 | Ht 74.5 in | Wt 195.4 lb

## 2014-04-15 DIAGNOSIS — I10 Essential (primary) hypertension: Secondary | ICD-10-CM

## 2014-04-15 DIAGNOSIS — E785 Hyperlipidemia, unspecified: Secondary | ICD-10-CM

## 2014-04-15 DIAGNOSIS — E1165 Type 2 diabetes mellitus with hyperglycemia: Principal | ICD-10-CM

## 2014-04-15 DIAGNOSIS — IMO0001 Reserved for inherently not codable concepts without codable children: Secondary | ICD-10-CM

## 2014-04-15 DIAGNOSIS — E119 Type 2 diabetes mellitus without complications: Secondary | ICD-10-CM

## 2014-04-15 LAB — GLUCOSE, POCT (MANUAL RESULT ENTRY): POC Glucose: 181 mg/dl — AB (ref 70–99)

## 2014-04-15 MED ORDER — SITAGLIP PHOS-METFORMIN HCL ER 100-1000 MG PO TB24: ORAL_TABLET | ORAL | Status: DC

## 2014-04-15 MED ORDER — GLIPIZIDE ER 5 MG PO TB24: 5.0000 mg | ORAL_TABLET | Freq: Every day | ORAL | Status: DC

## 2014-04-15 NOTE — Patient Instructions (Addendum)
Please check blood sugars at least half the time about 2 hours after any meal and every other day on waking up. May check the blood sugar up to twice a day Please bring blood sugar monitor to each visit  If blood sugars are going up after eating cereal in the morning may change to breakfast with more protein like eggs, low-fat cheese, cottage cheese, Greek yogurt or meat with small amount of carbohydrate  Stop the diabetes medication you're taking: Leave off glipizide and metformin  NEW medication for diabetes to be taken with a meal: 1. Glipizide ER, 5 mg once a day 2. JANUMET XR 50/1000, 2 tablets daily, may take these together.  Please bring records of all lab work done at [facility] on the next visit

## 2014-04-15 NOTE — Progress Notes (Signed)
Patient ID: Kevin [name], male   DOB: 02-13-1946, 68 y.o.   MRN: [id number]    Reason for Appointment: Consultation for Type 2 Diabetes  Referring physician:  History of Present Illness:          Diagnosis: Type 2 diabetes mellitus, date of diagnosis: 2001        Past history: His previous records are not available but initially his symptoms included weight loss He thinks he has been on metformin for several years and more recently has been taking 1000 mg twice a day [facility] His A1c was 6.9 in 2013 but a year later was 8.1  Recent history:  Patient is a poor historian and not clear if he has remembers his details correctly Apparently [facility] At that time he was told to start insulin but he refuses to do so Because of this he was put on glipizide 5 mg twice a day with food in addition to his metformin 1 g twice a day His recall is somewhat inconsistent but he thinks his blood sugars prior to starting glipizide were as high as 240 Generally checking blood sugars at breakfast and bedtime He believes his blood sugars in the mornings are still relatively high between 150-200 but generally better at bedtime with a range of 130-180 Also he may have had one episode of low blood sugar About 2 weeks ago he ran out of his metformin and is taking 500 twice a day from his fiance       Oral hypoglycemic drugs the patient is taking are: Glipizide 5 bid, metformin 500 twice a day      Side effects from medications have been: None  Glucose monitoring:  done 2 times a day         Glucometer:  Accu-Chek contact.      Blood Glucose readings as above  Hypoglycemia:  ? One episode when taking glipizide     Glycemic control:  Recent A1c not available  Lab Results  Component Value Date   HGBA1C 8.1* 11/07/2013   HGBA1C 6.9* 10/16/2012   Lab Results  Component Value Date   CREATININE 0.79  04/05/2012   microalbumin not available  Self-care: The diet that the patient has been following is: tries to limit portions, may get fried food when eating out, eating out at fast food about 4 days a week  Meals: 3 meals per day.  breakfast is at 7-8 am and is usually eating special K. cereal Usually eating baked chicken or chicken salad sandwiches for meals and will have fruits for snacks         Exercise:  30-40 minutes, 3-4 days a week on treadmill         Dietician visit: Most recent:2001, at [facility].               Compliance with the medical regimen: Good  Retinal exam: Most recent:. 3/15   Weight history: Wt Readings from Last 3 Encounters:  04/15/14 195 lb 6.4 oz (88.633 kg)  03/19/14 196 lb 12.8 oz (89.268 kg)  03/04/14 200 lb (90.719 kg)      Medication List       This list is accurate as of: 04/15/14 11:32 AM.  Always use your most recent med list.               ACCU-CHEK COMPACT CARE KIT Kit  Use as Instructed. DX: 250.00     ACCU-CHEK SOFTCLIX LANCETS lancets  Check once daily. Use as instructed.  DX: 250.00     aspirin 81 MG tablet  Take 81 mg by mouth daily.     buPROPion 150 MG 24 hr tablet  Commonly known as:  WELLBUTRIN XL  TAKE ONE TABLET BY MOUTH EVERY DAY     carvedilol 6.25 MG tablet  Commonly known as:  COREG  Take 1 tablet (6.25 mg total) by mouth 2 (two) times daily with a meal.     citalopram 20 MG tablet  Commonly known as:  CELEXA  Take 20 mg by mouth daily.     clobetasol ointment 0.05 %  Commonly known as:  TEMOVATE  Apply 1 application topically as needed.     glucose blood test strip  Commonly known as:  ACCU-CHEK COMPACT TEST DRUM  Use as instructed. DX: 250.00     latanoprost 0.005 % ophthalmic solution  Commonly known as:  XALATAN  Place 1 drop into the right eye daily.     Loratadine 10 MG Caps  Take 10 mg by mouth daily.     losartan 100 MG tablet  Commonly known as:  COZAAR  Take 1 tablet (100 mg total) by mouth  daily.     metFORMIN 1000 MG (MOD) 24 hr tablet  Commonly known as:  GLUMETZA  Take 1,000 mg by mouth 2 (two) times daily with a meal.     ondansetron 4 MG tablet  Commonly known as:  ZOFRAN  Take 1 tablet (4 mg total) by mouth every 8 (eight) hours as needed for nausea.     simvastatin 40 MG tablet  Commonly known as:  ZOCOR  Take 40 mg by mouth daily at 6 PM.     traZODone 50 MG tablet  Commonly known as:  DESYREL  Take 50 mg by mouth at bedtime as needed.     triamcinolone cream 0.1 %  Commonly known as:  KENALOG  as needed.        Allergies:  Allergies  Allergen Reactions  . Sulfa Antibiotics Swelling    Swelling of lips only.    Past Medical History  Diagnosis Date  . Diabetes mellitus   . Chronic kidney disease     stones  . UTI (urinary tract infection)   . Peri-rectal abscess   . Sinus bradycardia   . Anemia   . Pacemaker   . Blood transfusion     "in [place]"  . Nonischemic cardiomyopathy   . History of colon polyps   . RBBB   . Heart block AV second degree     permanent pacemaker 12/01/11 Medtronic    Past Surgical History  Procedure Laterality Date  . Permanent pacemaker insertion  12/01/11    Medtronic  . Inguinal hernia repair  1980    left  . Tonsillectomy    . Shrapnel surgery      "in [place]"  . Examination under anesthesia  04/06/2012    Procedure: EXAM UNDER ANESTHESIA;  Surgeon: Marcello Moores A. Cornett, MD;  Location: [facility];  Service: General;  Laterality: N/A;  . Nm myocar perf wall motion  01/21/2012    mod-severe perfusion defect due to infarct/scar w/mild perinfarct ischemia in the apical,basal inferoseptal,basal inferior,mid inferoseptal,midinferior and apical inferior regions  . Cardiac catheterization  01/27/2012    normal coronaries, dilated LV c/w nonischemic cardiomyopathy    Family History  Problem Relation Age of Onset  .  Ovarian cancer Mother   . Leukemia Father     Social History:  reports that he quit smoking about 30  years ago. His smoking use included Cigarettes. He has a 20 pack-year smoking history. He has never used smokeless tobacco. He reports that he drinks alcohol. He reports that he does not use illicit drugs.    Review of Systems       Lipids: He has been treated with simvastatin, usually has labs done at [facility]       No results found for this basename: CHOL, HDL, LDLCALC, LDLDIRECT, TRIG, CHOLHDL       No unusual headaches.                  Skin: No rash or infections     Thyroid:  He tends to get tired easily and is asking about taking herbal supplement for energy     The blood pressure has been well controlled with losartan and low-dose carvedilol     No swelling of feet.     No shortness of breath or chest discomfort on exertion.     Bowel habits: Normal.       No frequency of urination or nocturia       No joint  pains.      He has had some problems with  depression and is taking Wellbutrin. Also had been prescribed Celexa but not clear if he is taking this         No pain in his legs on walking      No history of Numbness, tingling or burning in feet       He has had difficulties with his memory and has not had this addressed.   LABS:  Not available  Physical Examination:  BP 130/82  Pulse 77  Temp(Src) 98.3 F (36.8 C)  Resp 14  Ht 6' 2.5" (1.892 m)  Wt 195 lb 6.4 oz (88.633 kg)  BMI 24.76 kg/m2  SpO2 97%  GENERAL:         Patient is averagely built and nourished, pleasant and cooperative  HEENT:         Eye exam shows normal external appearance. Fundus exam shows no retinopathy.  Oral exam shows normal mucosa .  NECK:         General:  Neck exam shows no lymphadenopathy. Carotids are normal to palpation and no bruit heard.  Thyroid is not enlarged and no nodules felt.   LUNGS:         Chest is symmetrical. Lungs are clear to auscultation.Marland Kitchen   HEART:         Heart sounds:  S1 and S2 are normal. No murmurs or clicks heard., no S3 or S4.    ABDOMEN:   There is no distention present. Liver and spleen are not palpable. No other mass or tenderness present.  EXTREMITIES:     There is no edema. No skin lesions present.Marland Kitchen  NEUROLOGICAL:   Vibration sense is mildly reduced in toes. Ankle jerks are absent bilaterally, biceps reflexes are normal          Diabetic foot exam:  no sensory loss or decrease in pulses, skin exam normal MUSCULOSKELETAL:       There is no enlargement or deformity of the joints. Spine is normal to inspection.Marland Kitchen   SKIN:       No rash. Has numerous benign pigmented moles on the trunk    ASSESSMENT:  Diabetes type 2,  uncontrolled    Patient has reportedly an [facility] but actual reports are not available His evaluation today is limited by the fact that his memory is relatively poor and he did not bring his monitor for download Also recently has been taking only 500 mg metformin instead of the 1000 he was on However he appears to have had progression of his diabetes since 2013 and has been mostly on metformin for the last few years He is also not obese but appears to be responding some to adding [facility]izide from the [facility] He appears to be indicating that his fasting readings are relatively high and most likely not getting benefit from the immediate release glipizide he is taking  Complications: None evident  PLAN:   Trial of Januvia 100 mg daily. Discussed how this works and potential benefit in stabilizing blood sugar long-term. Given brochure and booklet on Januvia and type 2 diabetes   For convenience will have him use Janumet XR maximum dose Once a day   Also since his fasting readings are relatively high will switch him to glipizide ER 5 mg daily for now and consider increasing the dose. Discussed potential for hypoglycemia   He may be also a candidate for a trial of Invokana before considering adding insulin  Given him examples of higher protein breakfast  foods and to avoid cereal especially blood sugars are higher after eating these  Continue exercise regimen  Bring blood sugar monitor for download. Discussed frequency and timing of blood sugar monitoring and targets  Consultation with dietitian  Counseling time over 50% of today's 60 minute visit  Elayne Snare 04/15/2014, 11:32 AM     Note: This office note was prepared with Dragon voice recognition system technology. Any transcriptional errors that result from this process are unintentional.

## 2014-04-17 ENCOUNTER — Telehealth: Payer: Self-pay | Admitting: Endocrinology

## 2014-04-17 ENCOUNTER — Telehealth: Payer: Self-pay | Admitting: *Deleted

## 2014-04-17 NOTE — Telephone Encounter (Signed)
Pharmacy called and stated they needed to speak with Dr. Dwyane Dee Medical Assistant   [phone]  Thank You

## 2014-04-17 NOTE — Telephone Encounter (Signed)
He was on Glipixide before also; he needs all of what I gave and its all ok

## 2014-04-17 NOTE — Telephone Encounter (Signed)
Left message on patient's voicemail.

## 2014-04-17 NOTE — Telephone Encounter (Signed)
Pharmacy called about patient, pharmacist and patient is concerned about taking the Janumet and glipizide.  Patient does not like Glipizide and Pharmacist wanted to make sure you are aware that he's on 2000 mg of Metformin.  Pharmacist wants to know if we can call patient to answer his questions and make him feel more at ease with his medication.

## 2014-05-16 ENCOUNTER — Telehealth: Payer: Self-pay | Admitting: Endocrinology

## 2014-05-16 NOTE — Telephone Encounter (Signed)
Please call pts pharmacy they will not refill his janumet without pt being seen. They need a form filled out stating that this med is necessary. Med is working very well. Pharm states they sent this form over 2 wks ago

## 2014-05-16 NOTE — Telephone Encounter (Signed)
Dr. Dwyane Dee said he needs to be seen in the office before we do the P.A, I've tried calling him to set up appt, but haven't been able to reach him.

## 2014-05-27 ENCOUNTER — Telehealth: Payer: Self-pay | Admitting: Family Medicine

## 2014-05-27 MED ORDER — ACCU-CHEK COMPACT PLUS CARE KIT: PACK | Status: DC

## 2014-05-27 NOTE — Telephone Encounter (Signed)
Pt needs refill on accu-chek compact care kit °

## 2014-05-27 NOTE — Telephone Encounter (Signed)
Rx sent to pharmacy   

## 2014-06-06 ENCOUNTER — Telehealth: Payer: Self-pay | Admitting: Family Medicine

## 2014-06-06 MED ORDER — CARVEDILOL 6.25 MG PO TABS: 6.2500 mg | ORAL_TABLET | Freq: Two times a day (BID) | ORAL | Status: DC

## 2014-06-06 MED ORDER — GLIPIZIDE ER 5 MG PO TB24: 5.0000 mg | ORAL_TABLET | Freq: Every day | ORAL | Status: DC

## 2014-06-06 NOTE — Telephone Encounter (Signed)
RX sent to pharmacy  

## 2014-06-06 NOTE — Telephone Encounter (Signed)
Pt is out of town and forgot his meds, states he will not be back until Monday, pt is needing meds to last until he get back home , rx glipiZIDE (GLUCOTROL XL) 5 MG 24 hr tablet, and carvedilol (coreg) 6.25 mg, and his blood pressure meds, pt was unsure of the name. Send to [facility] [place], [address].

## 2014-06-06 NOTE — Telephone Encounter (Signed)
Pt following up on request for refill. Can you call [facility]  Pharm at [phone]   Fax   [phone] Pt only needs 4 days only. Thank you

## 2014-06-06 NOTE — Telephone Encounter (Signed)
OK to refill

## 2014-06-10 ENCOUNTER — Encounter: Payer: Self-pay | Admitting: Family Medicine

## 2014-06-10 ENCOUNTER — Ambulatory Visit (INDEPENDENT_AMBULATORY_CARE_PROVIDER_SITE_OTHER): Payer: Medicare Other | Admitting: Family Medicine

## 2014-06-10 VITALS — BP 122/82 | HR 71 | Temp 97.8°F | Ht 74.5 in | Wt 205.0 lb

## 2014-06-10 DIAGNOSIS — S46819A Strain of other muscles, fascia and tendons at shoulder and upper arm level, unspecified arm, initial encounter: Secondary | ICD-10-CM

## 2014-06-10 DIAGNOSIS — S46811A Strain of other muscles, fascia and tendons at shoulder and upper arm level, right arm, initial encounter: Secondary | ICD-10-CM

## 2014-06-10 DIAGNOSIS — S43499A Other sprain of unspecified shoulder joint, initial encounter: Secondary | ICD-10-CM

## 2014-06-10 NOTE — Progress Notes (Signed)
No chief complaint on file.   HPI:  Acute visit in PCP absence for:  1) Shoulder pain: -started: mechanical fall 2 days ago, slippe don bottom step and kinda landed on R side and caught himself -symptoms: pain is mild-moderate, 4/10 pain levels, soreness in R trap -better with: heat, tylenol -worse with: certain movements of shoulder -denies:neck pain, weakness, numbness, fevers, LOC, head injury  ROS: See pertinent positives and negatives per HPI.  Past Medical History  Diagnosis Date  . Diabetes mellitus   . Chronic kidney disease     stones  . UTI (urinary tract infection)   . Peri-rectal abscess   . Sinus bradycardia   . Anemia   . Pacemaker   . Blood transfusion     "in [place]"  . Nonischemic cardiomyopathy   . History of colon polyps   . RBBB   . Heart block AV second degree     permanent pacemaker 12/01/11 Medtronic    Past Surgical History  Procedure Laterality Date  . Permanent pacemaker insertion  12/01/11    Medtronic  . Inguinal hernia repair  1980    left  . Tonsillectomy    . Shrapnel surgery      "in [place]"  . Examination under anesthesia  04/06/2012    Procedure: EXAM UNDER ANESTHESIA;  Surgeon: Marcello Moores A. Cornett, MD;  Location: [facility];  Service: General;  Laterality: N/A;  . Nm myocar perf wall motion  01/21/2012    mod-severe perfusion defect due to infarct/scar w/mild perinfarct ischemia in the apical,basal inferoseptal,basal inferior,mid inferoseptal,midinferior and apical inferior regions  . Cardiac catheterization  01/27/2012    normal coronaries, dilated LV c/w nonischemic cardiomyopathy    Family History  Problem Relation Age of Onset  . Ovarian cancer Mother   . Leukemia Father     History   Social History  . Marital Status: Married    Spouse Name: N/A    Number of Children: 0  . Years of Education: N/A   Occupational History  . [employer] banker    Social History Main Topics  . Smoking status: Former Smoker -- 1.00 packs/day  for 20 years    Types: Cigarettes    Quit date: 04/13/1984  . Smokeless tobacco: Never Used  . Alcohol Use: Yes     Comment: 12/01/11 "maybe 3 beers or glasses of wine /month"  . Drug Use: No  . Sexual Activity: None   Other Topics Concern  . None   Social History Narrative  . None    Current outpatient prescriptions:ACCU-CHEK SOFTCLIX LANCETS lancets, Check once daily. Use as instructed.  DX: 250.00, Disp: 100 each, Rfl: 3;  aspirin 81 MG tablet, Take 81 mg by mouth daily.  , Disp: , Rfl: ;  Blood Glucose Monitoring Suppl (ACCU-CHEK COMPACT CARE KIT) KIT, Use as Instructed. DX: 250.00, Disp: 1 each, Rfl: 0;  buPROPion (WELLBUTRIN XL) 150 MG 24 hr tablet, TAKE ONE TABLET BY MOUTH EVERY DAY, Disp: 30 tablet, Rfl: 5 carvedilol (COREG) 6.25 MG tablet, Take 1 tablet (6.25 mg total) by mouth 2 (two) times daily with a meal., Disp: 10 tablet, Rfl: 0;  clobetasol ointment (TEMOVATE) 1.61 %, Apply 1 application topically as needed. , Disp: , Rfl: ;  glipiZIDE (GLUCOTROL XL) 5 MG 24 hr tablet, Take 1 tablet (5 mg total) by mouth daily with breakfast., Disp: 10 tablet, Rfl: 0 glucose blood (ACCU-CHEK COMPACT TEST DRUM) test strip, Use as instructed. DX: 250.00, Disp: 100 each, Rfl: 3;  latanoprost (XALATAN) 0.005 % ophthalmic solution, Place 1 drop into the right eye daily. , Disp: , Rfl: ;  losartan (COZAAR) 100 MG tablet, Take 1 tablet (100 mg total) by mouth daily., Disp: 90 tablet, Rfl: 3;  lovastatin (MEVACOR) 10 MG tablet, Take 10 mg by mouth at bedtime., Disp: , Rfl:  SitaGLIPtin-MetFORMIN HCl [phone] MG TB24, 2 tablets once daily, Disp: 60 tablet, Rfl: 3;  triamcinolone cream (KENALOG) 0.1 %, as needed., Disp: , Rfl:   EXAM:  Filed Vitals:   06/10/14 1104  BP: 122/82  Pulse: 71  Temp: 97.8 F (36.6 C)    Body mass index is 25.98 kg/(m^2).  GENERAL: appears well, nad, nontoxic and pleasant  CV: radial pulses are 2+ and equal, no distal edema, cap refill < 2 sec  NEURO: sensation  intact in upper extremities  SKIN: no susupicious lesions, rash, erythema, warmth  PSYCH: A+O x 3, cooperative, normal mood and affect  Neck: full rom, no deformities   SHOULDER - MUSCULOSKELETAL:  Inspection: - effusion, - deformity, - atrophy, - erythema, - ecchymosis, - scapular winging  Palpation: - effusion, - deformity, + muscl spasm R LS  +/- TTP: - [place] jt, - clavicle, - AC jt, - ss insertion, - biceps tendon, - scapula, + trapezius/LS  ROM: abduction 180 deg, flex 180 deg, ext 30 deg, int rot 90 deg, ext rot 90 deg  Normal scapular motion without dyskinesis or winging  Provocative tests:- hawkins, - neer, - empty can, - speeds, - obrien, - scarf,  - apprehension, - sulcus, - liftoff,- drop arm  +/- weakness: + abduction, - flexion, -extension, - int rotation, - ext rotation  +/- pain with resisted: - abduction, - flexion, -extension, - int rotation, - ext rotation   ASSESSMENT AND PLAN:  Discussed the following assessment and plan:  Strain of levator scapulae muscle, right, initial encounter  -Patient advised to return or notify a doctor immediately if symptoms worsen or persist or new concerns arise.  Patient Instructions  -heat for 15 minuts twice daily  -exercises provided  -aleve per instructions or tylenol per instructions if needed for pain  -follow up in 4 weeks       Samia Kukla R.

## 2014-06-10 NOTE — Progress Notes (Signed)
Pre visit review using our clinic review tool, if applicable. No additional management support is needed unless otherwise documented below in the visit note. 

## 2014-06-10 NOTE — Patient Instructions (Signed)
-  heat for 15 minuts twice daily  -exercises provided  -aleve per instructions or tylenol per instructions if needed for pain  -follow up in 4 weeks

## 2014-06-11 ENCOUNTER — Other Ambulatory Visit: Payer: Self-pay | Admitting: Family Medicine

## 2014-06-21 ENCOUNTER — Telehealth: Payer: Self-pay | Admitting: Cardiology

## 2014-06-21 ENCOUNTER — Encounter: Payer: Medicare Other | Admitting: *Deleted

## 2014-06-21 NOTE — Telephone Encounter (Signed)
LMOVM reminding pt to send remote transmission.   

## 2014-06-24 ENCOUNTER — Encounter: Payer: Self-pay | Admitting: Cardiology

## 2014-06-24 ENCOUNTER — Telehealth: Payer: Self-pay | Admitting: Endocrinology

## 2014-06-24 NOTE — Telephone Encounter (Signed)
Patient has been called and instructed to make an appointment, rx needs a PA and unless the patient schedules and appt, the PA will not be done per Dr. Dwyane Dee

## 2014-06-24 NOTE — Telephone Encounter (Signed)
Pt is on the new diabetic drug he has 5 days left and  Needs a new rx. He does not remember the name of the med

## 2014-06-25 ENCOUNTER — Other Ambulatory Visit: Payer: Self-pay | Admitting: *Deleted

## 2014-06-25 MED ORDER — SITAGLIP PHOS-METFORMIN HCL ER 100-1000 MG PO TB24: ORAL_TABLET | ORAL | Status: DC

## 2014-06-27 ENCOUNTER — Ambulatory Visit: Payer: Medicare Other | Admitting: Endocrinology

## 2014-06-27 ENCOUNTER — Ambulatory Visit (INDEPENDENT_AMBULATORY_CARE_PROVIDER_SITE_OTHER): Payer: Medicare Other | Admitting: Endocrinology

## 2014-06-27 ENCOUNTER — Encounter: Payer: Self-pay | Admitting: Endocrinology

## 2014-06-27 VITALS — BP 130/84 | HR 76 | Temp 98.3°F | Resp 16 | Ht 74.5 in | Wt 201.0 lb

## 2014-06-27 DIAGNOSIS — E119 Type 2 diabetes mellitus without complications: Secondary | ICD-10-CM

## 2014-06-27 DIAGNOSIS — I10 Essential (primary) hypertension: Secondary | ICD-10-CM

## 2014-06-27 DIAGNOSIS — E785 Hyperlipidemia, unspecified: Secondary | ICD-10-CM

## 2014-06-27 LAB — GLUCOSE, POCT (MANUAL RESULT ENTRY): POC GLUCOSE: 186 mg/dL — AB (ref 70–99)

## 2014-06-27 NOTE — Progress Notes (Signed)
Patient ID: Kevin [name], male   DOB: 09-Feb-1946, 68 y.o.   MRN: [id number]    Reason for Appointment:  Followup for Type 2 Diabetes  Referring physician:  History of Present Illness:          Diagnosis: Type 2 diabetes mellitus, date of diagnosis: 2001        Past history: His previous records are not available but initially his symptoms included weight loss He thinks he has been on metformin for several years and more recently has been taking 1000 mg twice a day [facility] His A1c was 6.9 in 2013 but a year later was 8.1  Recent history:  Apparently his [facility] He had been on glipizide 5 mg twice a day with food in addition to his metformin 1 g twice a day On his consultation he was given Januvia in addition and his glipizide was changed to glipizide ER 5 mg daily He was asked to come back for followup in a month but he did not do so He says that with taking Janumet XR 100/1000 his blood sugars are improved and he stopped taking the addition of metformin that he had Again not clear if his history is accurate but he said that he is now taking only half of the glipizide ER He thinks that he cut it down a few days ago when his blood sugar was low normal at 82 and also because he was running out of the supply Has not taken his medications for 2 days as he ran out and his fasting glucose is high in the office today However he came that his blood sugars are excellent at home, does not have a functioning monitor at the moment       Oral hypoglycemic drugs the patient is taking are: Glipizide 1/2 qd, metformin 500 twice a day      Side effects from medications have been: None  Glucose monitoring:  done 2 times a day         Glucometer:  Accu-Chek compact     Blood Glucose readings  by recall 82- 165, mostly taking in the morning  Hypoglycemia:  ? One episode when blood  sugar was 82 and he felt a little dizzy   Glycemic control:  [facility] but report not available in the office  Lab Results  Component Value Date   HGBA1C 8.1* 11/07/2013   HGBA1C 6.9* 10/16/2012   Lab Results  Component Value Date   CREATININE 0.79 04/05/2012    microalbumin not available  Self-care: The diet that the patient has been following is: tries to limit portions, may get fried food when eating out, eating out at fast food about 4 days a week  Meals: 3 meals per day.  breakfast is at 7-8 am and is usually eating special K. cereal Usually eating baked chicken or chicken salad sandwiches for meals and will have fruits for snacks         Exercise:  30-40 minutes, 3-4 days a week on treadmill         Dietician visit: Most recent:2001, at [facility].               Compliance with the medical regimen: Inconsistent Retinal exam: Most recent:. 3/15   Weight history: Wt Readings from Last 3 Encounters:  06/27/14 201 lb (91.173 kg)  06/10/14 205 lb (92.987 kg)  04/15/14 195 lb 6.4 oz (88.633 kg)      Medication List       This list is accurate as of: 06/27/14  8:32 AM.  Always use your most recent med list.               ACCU-CHEK COMPACT CARE KIT Kit  Use as Instructed. DX: 250.00     ACCU-CHEK SOFTCLIX LANCETS lancets  Check once daily. Use as instructed.  DX: 250.00     aspirin 81 MG tablet  Take 81 mg by mouth daily.     buPROPion 150 MG 24 hr tablet  Commonly known as:  WELLBUTRIN XL  TAKE ONE TABLET BY MOUTH EVERY DAY     carvedilol 6.25 MG tablet  Commonly known as:  COREG  Take 1 tablet (6.25 mg total) by mouth 2 (two) times daily with a meal.     clobetasol ointment 0.05 %  Commonly known as:  TEMOVATE  Apply 1 application topically as needed.     glipiZIDE 5 MG 24 hr tablet  Commonly known as:  GLUCOTROL XL  Take 1 tablet (5 mg total) by mouth daily with breakfast.     glucose blood test strip  Commonly  known as:  ACCU-CHEK COMPACT TEST DRUM  Use as instructed. DX: 250.00     latanoprost 0.005 % ophthalmic solution  Commonly known as:  XALATAN  Place 1 drop into the right eye daily.     losartan 100 MG tablet  Commonly known as:  COZAAR  Take 1 tablet (100 mg total) by mouth daily.     lovastatin 10 MG tablet  Commonly known as:  MEVACOR  Take 10 mg by mouth at bedtime.     SitaGLIPtin-MetFORMIN HCl [phone] MG Tb24  Take 1 tablet daily     triamcinolone cream 0.1 %  Commonly known as:  KENALOG  as needed.        Allergies:  Allergies  Allergen Reactions  . Sulfa Antibiotics Swelling    Swelling of lips only.    Past Medical History  Diagnosis Date  . Diabetes mellitus   . Chronic kidney disease     stones  . UTI (urinary tract infection)   . Peri-rectal abscess   . Sinus bradycardia   . Anemia   . Pacemaker   . Blood transfusion     "in [place]"  . Nonischemic cardiomyopathy   . History of colon polyps   . RBBB   . Heart block AV second degree     permanent pacemaker 12/01/11 Medtronic    Past Surgical History  Procedure Laterality Date  . Permanent pacemaker insertion  12/01/11    Medtronic  . Inguinal hernia repair  1980    left  . Tonsillectomy    . Shrapnel surgery      "in [place]"  . Examination under anesthesia  04/06/2012    Procedure: EXAM UNDER ANESTHESIA;  Surgeon: Marcello Moores A. Cornett, MD;  Location: [place];  Service: General;  Laterality: N/A;  . Nm myocar perf wall motion  01/21/2012    mod-severe perfusion defect due to infarct/scar w/mild perinfarct ischemia in the apical,basal inferoseptal,basal inferior,mid inferoseptal,midinferior and apical inferior regions  . Cardiac catheterization  01/27/2012    normal coronaries, dilated LV c/w nonischemic cardiomyopathy    Family History  Problem Relation Age of Onset  . Ovarian cancer Mother   . Leukemia Father     Social History:  reports that he quit smoking about 30 years ago. His smoking  use included Cigarettes. He has a 20 pack-year smoking history. He has never used smokeless tobacco. He reports that he drinks alcohol. He reports that he does not use illicit drugs.    Review of Systems       Lipids: He has been treated with simvastatin, usually has labs done at [facility]       No results found for this basename: CHOL,  HDL,  LDLCALC,  LDLDIRECT,  TRIG,  CHOLHDL        He has had difficulties with his memory and he thinks this is better      Physical Examination:  BP 130/84  Pulse 76  Temp(Src) 98.3 F (36.8 C)  Resp 16  Ht 6' 2.5" (1.892 m)  Wt 201 lb (91.173 kg)  BMI 25.47 kg/m2  SpO2 98%  Exam not indicated  ASSESSMENT:  Diabetes type 2, uncontrolled    Patient has reportedly improved blood sugar control with using Janumet XR and glipizide ER He reportedly had an A1c of 6.5% recently compared to the 10[facility] but actual reports are not available Also he has not brought his home glucose readings for confirming his level of control although he claims that his blood sugars are mostly near normal Do not think he had any hypoglycemia as his lowest glucose has been only 82 at home and he may have had transient dizziness for other reasons He is overall doing fairly well with regular exercise Weight has come up with improving his blood sugar control  PLAN:   Continue same medications including Janumet XR and glipizide ER; he would take his glipizide ER in the evening and avoid cutting it in half  Consider reducing glipizide ER 2.5 mg if he has low normal blood sugars  Will ask the [facility] to send copies of his reports  Continue exercise regimen  Bring blood sugar monitor for download on each visit Discussed frequency and timing of blood sugar monitoring and targets  Consultation with dietitian  Counseling time over 50% of today's 60 minute visit  Kevin [name] 06/27/2014, 8:32 AM     Note: This  office note was prepared with Dragon voice recognition system technology. Any transcriptional errors that result from this process are unintentional.

## 2014-06-27 NOTE — Patient Instructions (Addendum)
Please check blood sugars at least half the time about 2 hours after any meal and 3 times per week on waking up. Please bring blood sugar monitor to each visit  Glipizide in pms instead of ams, do not cut in half

## 2014-07-02 ENCOUNTER — Encounter: Payer: Self-pay | Admitting: Family Medicine

## 2014-07-02 ENCOUNTER — Ambulatory Visit (INDEPENDENT_AMBULATORY_CARE_PROVIDER_SITE_OTHER): Payer: Medicare Other | Admitting: Family Medicine

## 2014-07-02 VITALS — BP 132/80 | HR 73 | Temp 97.8°F | Wt 205.0 lb

## 2014-07-02 DIAGNOSIS — S46911A Strain of unspecified muscle, fascia and tendon at shoulder and upper arm level, right arm, initial encounter: Secondary | ICD-10-CM

## 2014-07-02 DIAGNOSIS — IMO0002 Reserved for concepts with insufficient information to code with codable children: Secondary | ICD-10-CM

## 2014-07-02 MED ORDER — MELOXICAM 15 MG PO TABS: 15.0000 mg | ORAL_TABLET | Freq: Every day | ORAL | Status: DC

## 2014-07-02 NOTE — Progress Notes (Signed)
   Subjective:    Patient ID: Kevin [name], male    DOB: 09-01-1946, 68 y.o.   MRN: [id number]  HPI Patient seen with right shoulder pain. He had a fall about 3-4 weeks ago and landed on his right shoulder. He states he missed a step and fell. No loss of consciousness. No other injuries noted. He was out of city at that time and went to urgent care and had x-rays which were reportedly negative. Denies any neck pain. He has pain mostly at night when rolling onto his right shoulder. No restricted range of motion. No relief with Tylenol. He comes in requesting prescription for Celebrex at the suggestion of a friend, though he has reported history of allergy to sulfa. Pain is moderate at night. No weakness.  Past Medical History  Diagnosis Date  . Diabetes mellitus   . Chronic kidney disease     stones  . UTI (urinary tract infection)   . Peri-rectal abscess   . Sinus bradycardia   . Anemia   . Pacemaker   . Blood transfusion     "in [place]"  . Nonischemic cardiomyopathy   . History of colon polyps   . RBBB   . 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 Permanent pacemaker insertion  12/01/11    [employer]tronic  . Inguinal hernia repair  1980    left  . Tonsillectomy    . Shrapnel surgery      "in [place]"  . Examination under anesthesia  04/06/2012    Procedure: EXAM UNDER ANESTHESIA;  Surgeon: Marcello Moores A. Cornett, MD;  Location: [place];  Service: General;  Laterality: N/A;  . Nm myocar perf wall motion  01/21/2012    mod-severe perfusion defect due to infarct/scar w/mild perinfarct ischemia in the apical,basal inferoseptal,basal inferior,mid inferoseptal,midinferior and apical inferior regions  . Cardiac catheterization  01/27/2012    normal coronaries, dilated LV c/w nonischemic cardiomyopathy    reports that he quit smoking about 30 years ago. His smoking use included Cigarettes. He has a 20 pack-year  smoking history. He has never used smokeless tobacco. He reports that he drinks alcohol. He reports that he does not use illicit drugs. family history includes Leukemia in his father; Ovarian cancer in his mother. Allergies  Allergen Reactions  . Sulfa Antibiotics Swelling    Swelling of lips only.      Review of Systems  Neurological: Negative for weakness, numbness and headaches.       Objective:   Physical Exam  Constitutional: He appears well-developed and well-nourished.  Cardiovascular: Normal rate and regular rhythm.   Pulmonary/Chest: Effort normal and breath sounds normal. No respiratory distress. He has no wheezes. He has no rales.  Musculoskeletal:  Right shoulder chills no ecchymosis. No asymmetry. No localized tenderness. Full range of motion. No biceps tenderness.  Neurological:  Full-strength right upper extremity including rotator cuff. Symmetric reflexes          Assessment & Plan:  Right shoulder pain. Suspect strain. Clinically, no evidence for rotator cuff tear. Short-term trial of meloxicam 15 mg once daily. Touch base 2-3 weeks if no better

## 2014-07-02 NOTE — Progress Notes (Signed)
Pre visit review using our clinic review tool, if applicable. No additional management support is needed unless otherwise documented below in the visit note. 

## 2014-07-08 ENCOUNTER — Ambulatory Visit: Payer: Medicare Other | Admitting: Family Medicine

## 2014-07-10 ENCOUNTER — Other Ambulatory Visit: Payer: Self-pay | Admitting: *Deleted

## 2014-07-10 MED ORDER — GLIPIZIDE ER 5 MG PO TB24: 5.0000 mg | ORAL_TABLET | Freq: Every day | ORAL | Status: DC

## 2014-07-11 ENCOUNTER — Other Ambulatory Visit: Payer: Self-pay | Admitting: Family Medicine

## 2014-08-07 ENCOUNTER — Ambulatory Visit (INDEPENDENT_AMBULATORY_CARE_PROVIDER_SITE_OTHER): Payer: Medicare Other | Admitting: Family Medicine

## 2014-08-07 ENCOUNTER — Encounter: Payer: Self-pay | Admitting: Family Medicine

## 2014-08-07 VITALS — BP 124/76 | HR 86 | Wt 202.0 lb

## 2014-08-07 DIAGNOSIS — M545 Low back pain, unspecified: Secondary | ICD-10-CM

## 2014-08-07 MED ORDER — CYCLOBENZAPRINE HCL 5 MG PO TABS: 5.0000 mg | ORAL_TABLET | Freq: Three times a day (TID) | ORAL | Status: DC | PRN

## 2014-08-07 NOTE — Patient Instructions (Signed)

## 2014-08-07 NOTE — Progress Notes (Signed)
Pre visit review using our clinic review tool, if applicable. No additional management support is needed unless otherwise documented below in the visit note. 

## 2014-08-07 NOTE — Progress Notes (Signed)
   Subjective:    Patient ID: Kevin [name], male    DOB: 1946/07/18, 68 y.o.   MRN: [id number]  HPI  Patient seen with low back pain. Onset last week after playing golf. He has some diffuse lower lumbar pain. Over the weekend he went to urgent care and was prescribed long-acting Flexeril which he thinks helped somewhat. He has sensation of spasm off and on. No radiculopathy symptoms. No fevers or chills. No recent appetite or weight changes.  Past Medical History  Diagnosis Date  . Diabetes mellitus   . Chronic kidney disease     stones  . UTI (urinary tract infection)   . Peri-rectal abscess   . Sinus bradycardia   . Anemia   . Pacemaker   . Blood transfusion     "in [place]"  . Nonischemic cardiomyopathy   . History of colon polyps   . RBBB   . 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 Permanent pacemaker insertion  12/01/11    [employer]tronic  . Inguinal hernia repair  1980    left  . Tonsillectomy    . Shrapnel surgery      "in [place]"  . Examination under anesthesia  04/06/2012    Procedure: EXAM UNDER ANESTHESIA;  Surgeon: Marcello Moores A. Cornett, MD;  Location: [place];  Service: General;  Laterality: N/A;  . Nm myocar perf wall motion  01/21/2012    mod-severe perfusion defect due to infarct/scar w/mild perinfarct ischemia in the apical,basal inferoseptal,basal inferior,mid inferoseptal,midinferior and apical inferior regions  . Cardiac catheterization  01/27/2012    normal coronaries, dilated LV c/w nonischemic cardiomyopathy    reports that he quit smoking about 30 years ago. His smoking use included Cigarettes. He has a 20 pack-year smoking history. He has never used smokeless tobacco. He reports that he drinks alcohol. He reports that he does not use illicit drugs. family history includes Leukemia in his father; Ovarian cancer in his mother. Allergies  Allergen Reactions  . Sulfa  Antibiotics Swelling    Swelling of lips only.     Review of Systems  Constitutional: Negative for fever, activity change, appetite change and unexpected weight change.  Respiratory: Negative for cough and shortness of breath.   Cardiovascular: Negative for chest pain and leg swelling.  Gastrointestinal: Negative for vomiting and abdominal pain.  Genitourinary: Negative for dysuria, hematuria and flank pain.  Musculoskeletal: Positive for back pain. Negative for joint swelling.  Neurological: Negative for weakness and numbness.       Objective:   Physical Exam  Constitutional: He is oriented to person, place, and time. He appears well-developed and well-nourished. No distress.  Neck: No thyromegaly present.  Cardiovascular: Normal rate, regular rhythm and normal heart sounds.   No murmur heard. Pulmonary/Chest: Effort normal and breath sounds normal. No respiratory distress. He has no wheezes. He has no rales.  Musculoskeletal: He exhibits no edema.  Straight leg raises are negative bilaterally  Neurological: He is alert and oriented to person, place, and time. He has normal reflexes. No cranial nerve deficit.  Full-strength lower extremities  Skin: No rash noted.          Assessment & Plan:  Lumbar back pain. Suspect musculoskeletal strain. Handout given. Flexeril 5 mg every 8 hours when necessary for spasm. He'll try topical heat and stretches. Walking as tolerated. Touch base in 2-3 weeks if no better

## 2014-08-28 ENCOUNTER — Emergency Department (HOSPITAL_COMMUNITY): Payer: Medicare Other

## 2014-08-28 ENCOUNTER — Encounter (HOSPITAL_COMMUNITY): Payer: Self-pay | Admitting: Emergency Medicine

## 2014-08-28 ENCOUNTER — Emergency Department (HOSPITAL_COMMUNITY)
Admission: EM | Admit: 2014-08-28 | Discharge: 2014-08-28 | Disposition: A | Discharge status: Home / Self Care | Payer: Medicare Other | Attending: Emergency Medicine | Admitting: Emergency Medicine

## 2014-08-28 DIAGNOSIS — Z950 Presence of cardiac pacemaker: Secondary | ICD-10-CM | POA: Diagnosis not present

## 2014-08-28 DIAGNOSIS — E119 Type 2 diabetes mellitus without complications: Secondary | ICD-10-CM | POA: Diagnosis not present

## 2014-08-28 DIAGNOSIS — Z87891 Personal history of nicotine dependence: Secondary | ICD-10-CM | POA: Insufficient documentation

## 2014-08-28 DIAGNOSIS — R5383 Other fatigue: Secondary | ICD-10-CM | POA: Diagnosis not present

## 2014-08-28 DIAGNOSIS — Z86010 Personal history of colonic polyps: Secondary | ICD-10-CM | POA: Insufficient documentation

## 2014-08-28 DIAGNOSIS — Z79899 Other long term (current) drug therapy: Secondary | ICD-10-CM | POA: Insufficient documentation

## 2014-08-28 DIAGNOSIS — Z7982 Long term (current) use of aspirin: Secondary | ICD-10-CM | POA: Diagnosis not present

## 2014-08-28 DIAGNOSIS — R079 Chest pain, unspecified: Secondary | ICD-10-CM | POA: Diagnosis present

## 2014-08-28 DIAGNOSIS — N189 Chronic kidney disease, unspecified: Secondary | ICD-10-CM | POA: Diagnosis not present

## 2014-08-28 DIAGNOSIS — Z8679 Personal history of other diseases of the circulatory system: Secondary | ICD-10-CM | POA: Diagnosis not present

## 2014-08-28 DIAGNOSIS — R091 Pleurisy: Secondary | ICD-10-CM

## 2014-08-28 DIAGNOSIS — Z87448 Personal history of other diseases of urinary system: Secondary | ICD-10-CM | POA: Insufficient documentation

## 2014-08-28 LAB — BASIC METABOLIC PANEL
Anion gap: 12 (ref 5–15)
BUN: 9 mg/dL (ref 6–23)
CO2: 25 meq/L (ref 19–32)
CREATININE: 0.8 mg/dL (ref 0.50–1.35)
Calcium: 8.9 mg/dL (ref 8.4–10.5)
Chloride: 104 mEq/L (ref 96–112)
GFR calc Af Amer: >90 mL/min (ref >90)
GFR calc non Af Amer: 90 mL/min — ABNORMAL LOW (ref >90)
GLUCOSE: 163 mg/dL — AB (ref 70–99)
Potassium: 4.4 mEq/L (ref 3.7–5.3)
Sodium: 141 mEq/L (ref 137–147)

## 2014-08-28 LAB — CBC
HCT: 45.5 % (ref 39.0–52.0)
HEMOGLOBIN: 15.3 g/dL (ref 13.0–17.0)
MCH: 33.2 pg (ref 26.0–34.0)
MCHC: 33.6 g/dL (ref 30.0–36.0)
MCV: 98.7 fL (ref 78.0–100.0)
PLATELETS: 163 10*3/uL (ref 150–400)
RBC: 4.61 MIL/uL (ref 4.22–5.81)
RDW: 12.7 % (ref 11.5–15.5)
WBC: 8.2 10*3/uL (ref 4.0–10.5)

## 2014-08-28 LAB — I-STAT TROPONIN, ED: Troponin i, poc: 0.01 ng/mL (ref 0.00–0.08)

## 2014-08-28 LAB — D-DIMER, QUANTITATIVE (NOT AT ARMC): D DIMER QUANT: 0.53 ug{FEU}/mL — AB (ref 0.00–0.48)

## 2014-08-28 MED ORDER — IOHEXOL 350 MG/ML SOLN
100.0000 mL | Freq: Once | INTRAVENOUS | Status: AC | PRN
  Administered 2014-08-28: 85 mL via INTRAVENOUS

## 2014-08-28 NOTE — Discharge Instructions (Signed)
Metformin and X-ray Contrast Studies For some X-ray exams, a contrast dye is used. Contrast dye is a type of medicine used to make the X-ray image clearer. The contrast dye is given to the patient through a vein (intravenously). If you need to have this type of X-ray exam and you take a medication called metformin, your caregiver may have you stop taking metformin before the exam.  LACTIC ACIDOSIS In rare cases, a serious medical condition called lactic acidosis can develop in people who take metformin and receive contrast dye. The following conditions can increase the risk of this complication:   Kidney failure.  Liver problems.  Certain types of heart problems such as:  Heart failure.  Heart attack.  Heart infection.  Heart valve problems.  Alcohol abuse. If left untreated, lactic acidosis can lead to coma.  SYMPTOMS OF LACTIC ACIDOSIS Symptoms of lactic acidosis can include:  Rapid breathing (hyperventilation).  Neurologic symptoms such as:  Headaches.  Confusion.  Dizziness.  Excessive sweating.  Feeling sick to your stomach (nauseous) or throwing up (vomiting). AFTER THE X-RAY EXAM  Stay well-hydrated. Drink fluids as instructed by your caregiver.  If you have a risk of developing lactic acidosis, blood tests may be done to make sure your kidney function is okay.  Metformin is usually stopped for 48 hours after the X-ray exam. Ask your caregiver when you can start taking metformin again. SEEK MEDICAL CARE IF:   You have shortness of breath or difficulty breathing.  You develop a headache that does not go away.  You have nausea or vomiting.  You urinate more than normal.  You develop a skin rash and have:  Redness.  Swelling.  Itching. Document Released: 10/13/2009 Document Revised: 01/17/2012 Document Reviewed: 10/13/2009 [facility] Patient Information 2015 [place], [place]. This information is not intended to replace advice given to you by your health  care provider. Make sure you discuss any questions you have with your health care provider.  Pleurisy Pleurisy is an inflammation and swelling of the lining of the lungs (pleura). Because of this inflammation, it hurts to breathe. It can be aggravated by coughing, laughing, or deep breathing. Pleurisy is often caused by an underlying infection or disease.  HOME CARE INSTRUCTIONS  Monitor your pleurisy for any changes. The following actions may help to alleviate any discomfort you are experiencing:  Medicine may help with pain. Only take over-the-counter or prescription medicines for pain, discomfort, or fever as directed by your health care provider.  Only take antibiotic medicine as directed. Make sure to finish it even if you start to feel better. SEEK MEDICAL CARE IF:   Your pain is not controlled with medicine or is increasing.  You have an increase in pus-like (purulent) secretions brought up with coughing. SEEK IMMEDIATE MEDICAL CARE IF:   You have blue or dark lips, fingernails, or toenails.  You are coughing up blood.  You have increased difficulty breathing.  You have continuing pain unrelieved by medicine or pain lasting more than 1 week.  You have pain that radiates into your neck, arms, or jaw.  You develop increased shortness of breath or wheezing.  You develop a fever, rash, vomiting, fainting, or other serious symptoms. MAKE SURE YOU:  Understand these instructions.   Will watch your condition.   Will get help right away if you are not doing well or get worse.  Document Released: 10/25/2005 Document Revised: 06/27/2013 Document Reviewed: 04/08/2013 [facility] Patient Information 2015 [place], [place]. This information is not intended  to replace advice given to you by your health care provider. Make sure you discuss any questions you have with your health care provider. ° °

## 2014-08-28 NOTE — ED Notes (Signed)
Pt sts that he having chest pain with movement and breathing. Denies cough. Denies SOB

## 2014-08-28 NOTE — ED Provider Notes (Signed)
CSN: [id number]     Arrival date & time 08/28/14  0626 History   First MD Initiated Contact with Patient 08/28/14 [phone]     Chief Complaint  Patient presents with  . Chest Pain      Patient is a 68 y.o. male presenting with chest pain. The history is provided by the patient.  Chest Pain Pain location:  Substernal area Pain quality: sharp   Pain radiates to:  Does not radiate Pain severity:  Moderate Onset quality:  Gradual Duration:  1 day Timing:  Intermittent Progression:  Unchanged Chronicity:  New Relieved by:  Rest Worsened by:  Deep breathing Associated symptoms: fatigue and shortness of breath   Associated symptoms: no abdominal pain, no cough, no fever, no lower extremity edema, not vomiting and no weakness   Patient reports that since yesterday he has had CP with deep breathing No cough/hemoptysis Last night, pain was worse with movement in bed but now only hurts with deep breaths No exertional CP He does report mild fatigue  Past Medical History  Diagnosis Date  . Diabetes mellitus   . Chronic kidney disease     stones  . UTI (urinary tract infection)   . Peri-rectal abscess   . Sinus bradycardia   . Anemia   . Pacemaker   . Blood transfusion     "in [place]"  . Nonischemic cardiomyopathy   . History of colon polyps   . RBBB   . 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 Permanent pacemaker insertion  12/01/11    Medtronic  . Inguinal hernia repair  1980    left  . Tonsillectomy    . Shrapnel surgery      "in [place]"  . Examination under anesthesia  04/06/2012    Procedure: EXAM UNDER ANESTHESIA;  Surgeon: Marcello Moores A. Cornett, MD;  Location: [place];  Service: General;  Laterality: N/A;  . Nm myocar perf wall motion  01/21/2012    mod-severe perfusion defect due to infarct/scar w/mild perinfarct ischemia in the apical,basal inferoseptal,basal inferior,mid  inferoseptal,midinferior and apical inferior regions  . Cardiac catheterization  01/27/2012    normal coronaries, dilated LV c/w nonischemic cardiomyopathy   Family History  Problem Relation Age of Onset  . Ovarian cancer Mother   . Leukemia Father    History  Substance Use Topics  . Smoking status: Former Smoker -- 1.00 packs/day for 20 years    Types: Cigarettes    Quit date: 04/13/1984  . Smokeless tobacco: Never Used  . Alcohol Use: Yes     Comment: 12/01/11 "maybe 3 beers or glasses of wine /month"    Review of Systems  Constitutional: Positive for fatigue. Negative for fever.  Respiratory: Positive for shortness of breath. Negative for cough.        Denies hemoptysis   Cardiovascular: Positive for chest pain.  Gastrointestinal: Negative for vomiting and abdominal pain.  Neurological: Negative for weakness.  All other systems reviewed and are negative.     Allergies  Sulfa antibiotics  Home Medications   Prior to Admission medications   Medication Sig Start Date End Date Taking? Authorizing Provider  aspirin 81 MG tablet Take 81 mg by mouth daily.     Yes Historical Provider, MD  buPROPion (WELLBUTRIN XL) 150 MG 24 hr tablet Take 150 mg by mouth daily.   Yes Historical Provider, MD  carvedilol (COREG) 6.25 MG  tablet Take 1 tablet (6.25 mg total) by mouth 2 (two) times daily with a meal. 06/06/14  Yes [name], MD  clobetasol ointment (TEMOVATE) 7.51 % Apply 1 application topically as needed.  09/26/13  Yes Historical Provider, MD  glipiZIDE (GLUCOTROL XL) 5 MG 24 hr tablet Take 1 tablet (5 mg total) by mouth daily with breakfast. 07/10/14  Yes Elayne Snare, MD  latanoprost (XALATAN) 0.005 % ophthalmic solution Place 1 drop into the right eye daily.  09/21/12  Yes Historical Provider, MD  losartan (COZAAR) 100 MG tablet Take 1 tablet (100 mg total) by mouth daily. 03/04/14  Yes [name], MD  lovastatin (MEVACOR) 10 MG tablet Take 10 mg by mouth at bedtime.    Yes Historical Provider, MD  SitaGLIPtin-MetFORMIN HCl [id number] MG TB24 Take 1 tablet daily 06/25/14  Yes Elayne Snare, MD   BP 120/74  Pulse 93  Temp(Src) 98.1 F (36.7 C) (Oral)  Resp 12  SpO2 97% Physical Exam CONSTITUTIONAL: Well developed/well nourished HEAD: Normocephalic/atraumatic EYES: EOMI/PERRL ENMT: Mucous membranes moist NECK: supple no meningeal signs SPINE:entire spine nontender CV: S1/S2 noted, no murmurs/rubs/gallops noted Chest - no tenderness to palpation LUNGS: Lungs are clear to auscultation bilaterally, no apparent distress ABDOMEN: soft, nontender, no rebound or guarding GU:no cva tenderness NEURO: Pt is awake/alert, moves all extremitiesx4 EXTREMITIES: pulses normal, full ROM SKIN: warm, color normal PSYCH: no abnormalities of mood noted  ED Course  Procedures  9:38 AM Cardiac cath from 2013 reveals essentially normal coronary arteries. He has h/o nonischemic CM as well h/o 2nd degree block and now has pacemaker He denies h/o PE Workup pending at this time 1:17 PM CT chest negative Pt requesting d/c home I have low suspicion for ACS given history/exam Labs Review Labs Reviewed  BASIC METABOLIC PANEL - Abnormal; Notable for the following:    Glucose, Bld 163 (*)    GFR calc non Af Amer 90 (*)    All other components within normal limits  D-DIMER, QUANTITATIVE - Abnormal; Notable for the following:    D-Dimer, Quant 0.53 (*)    All other components within normal limits  CBC  I-STAT TROPOININ, ED    Imaging Review Dg Chest 2 View  08/28/2014   CLINICAL DATA:  Midchest pain, worse when taking a deep breath.  EXAM: CHEST  2 VIEW  COMPARISON:  Chest CTA 03/15/2012 and radiographs 12/02/2011  FINDINGS: Left subclavian approach dual lead pacemaker remains in place. Cardiomediastinal silhouette is within normal limits. Mild biapical pleural thickening is again seen. No airspace consolidation, edema, pleural effusion, or pneumothorax is identified. No  acute osseous abnormality is seen.  IMPRESSION: No active cardiopulmonary disease.   Electronically Signed   By: Logan Bores   On: 08/28/2014 08:59   Ct Angio Chest Pe W/cm &/or Wo Cm  08/28/2014   CLINICAL DATA:  Acute onset chest pain yesterday. No short of breath. Concern for pulmonary embolism.  EXAM: CT ANGIOGRAPHY CHEST WITH CONTRAST  TECHNIQUE: Multidetector CT imaging of the chest was performed using the standard protocol during bolus administration of intravenous contrast. Multiplanar CT image reconstructions and MIPs were obtained to evaluate the vascular anatomy.  CONTRAST:  32mL OMNIPAQUE IOHEXOL 350 MG/ML SOLN  COMPARISON:  None.  FINDINGS: There are no filling defects within the pulmonary arteries to suggest acute pulmonary embolism. No acute findings of the aorta or great vessels. No pericardial fluid. Esophagus is normal.  Review of the lung parenchyma demonstrates bibasilar atelectasis. No pneumothorax or  pleural fluid. No airspace disease. Airways are normal.  There is no axillary or supraclavicular lymphadenopathy. Pacemaker in the left anterior chest wall. No mediastinal lymphadenopathy. Small paratracheal lymph nodes are not pathologic by size criteria.  Limited view of the upper abdomen dips demonstrates a small gallstone. Adrenal glands are normal.  No aggressive osseous lesion.  Review of the MIP images confirms the above findings.  IMPRESSION: 1. No evidence of acute pulmonary embolism. 2. Small gallstone. 3. Mild bibasilar atelectasis .   Electronically Signed   By: Suzy Bouchard M.D.   On: 08/28/2014 12:47     EKG Interpretation   Date/Time:  Wednesday August 28 2014 08:37:34 EDT Ventricular Rate:  97 PR Interval:  176 QRS Duration: 168 QT Interval:  412 QTC Calculation: 523 R Axis:     Text Interpretation:  Normal sinus rhythm Non-specific intra-ventricular  conduction block Abnormal ECG No previous ECGs available Confirmed by  Christy Gentles  MD, Elenore Rota ([id number]) on  08/28/2014 8:51:40 AM      MDM   Final diagnoses:  Pleurisy    Nursing notes including past medical history and social history reviewed and considered in documentation xrays reviewed and considered Labs/vital reviewed and considered Previous records reviewed and considered     Sharyon Cable, MD 08/28/14 1317

## 2014-08-28 NOTE — ED Notes (Signed)
Patient transported to CT 

## 2014-08-29 ENCOUNTER — Encounter: Payer: Self-pay | Admitting: Family Medicine

## 2014-08-29 ENCOUNTER — Ambulatory Visit (INDEPENDENT_AMBULATORY_CARE_PROVIDER_SITE_OTHER): Payer: Medicare Other | Admitting: Family Medicine

## 2014-08-29 VITALS — BP 130/68 | HR 90 | Temp 98.1°F | Wt 200.0 lb

## 2014-08-29 DIAGNOSIS — R0789 Other chest pain: Secondary | ICD-10-CM

## 2014-08-29 DIAGNOSIS — Z23 Encounter for immunization: Secondary | ICD-10-CM

## 2014-08-29 NOTE — Progress Notes (Signed)
Pre visit review using our clinic review tool, if applicable. No additional management support is needed unless otherwise documented below in the visit note. 

## 2014-08-29 NOTE — Progress Notes (Signed)
Subjective:    Patient ID: Kevin [name], male    DOB: 03/23/1946, 68 y.o.   MRN: [id number]  HPI  Patient seen for recent ER followup.  Had one-day history of some atypical chest pain which was pain with deep breathing. Not had any hemoptysis or any cough or fever. He was seen in the ER on 08/28/2014. Ruled out for MI. EKG no acute changes. He had minimally elevated d-dimer and chest x-ray and CT angiogram of the chest unremarkable. His previous history is that he has second degree heart block with pacemaker and cardiac catheter 2013 essentially normal coronary arteries. History of nonischemic cardiomyopathy.  Patient's chest pain had resolved prior to leaving ER. He denied any recent GERD symptoms. He has not any recurrent symptoms. No recent exertional symptoms whatsoever. He does have type 2 diabetes which is been somewhat suboptimal control. Followup with endocrinology in November. His blood pressures been stable. Medications reviewed.  Past Medical History  Diagnosis Date  . Diabetes mellitus   . Chronic kidney disease     stones  . UTI (urinary tract infection)   . Peri-rectal abscess   . Sinus bradycardia   . Anemia   . Pacemaker   . Blood transfusion     "in [place]"  . Nonischemic cardiomyopathy   . History of colon polyps   . RBBB   . 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 Permanent pacemaker insertion  12/01/11    [employer]  . Inguinal hernia repair  1980    left  . Tonsillectomy    . Shrapnel surgery      "in [place]"  . Examination under anesthesia  04/06/2012    Procedure: EXAM UNDER ANESTHESIA;  Surgeon: Marcello Moores A. Cornett, MD;  Location: [place];  Service: General;  Laterality: N/A;  . Nm myocar perf wall motion  01/21/2012    mod-severe perfusion defect due to infarct/scar w/mild perinfarct ischemia in the apical,basal inferoseptal,basal inferior,mid  inferoseptal,midinferior and apical inferior regions  . Cardiac catheterization  01/27/2012    normal coronaries, dilated LV c/w nonischemic cardiomyopathy    reports that he quit smoking about 30 years ago. His smoking use included Cigarettes. He has a 20 pack-year smoking history. He has never used smokeless tobacco. He reports that he drinks alcohol. He reports that he does not use illicit drugs. family history includes Leukemia in his father; Ovarian cancer in his mother. Allergies  Allergen Reactions  . Sulfa Antibiotics Swelling    Swelling of lips only.      Review of Systems  Constitutional: Negative for fatigue.  Eyes: Negative for visual disturbance.  Respiratory: Negative for cough, chest tightness and shortness of breath.   Cardiovascular: Negative for chest pain, palpitations and leg swelling.  Neurological: Negative for dizziness, syncope, weakness, light-headedness and headaches.       Objective:   Physical Exam  Constitutional: He is oriented to person, place, and time. He appears well-developed and well-nourished.  HENT:  Right Ear: External ear normal.  Left Ear: External ear normal.  Mouth/Throat: Oropharynx is clear and moist.  Eyes: Pupils are equal, round, and reactive to light.  Neck: Neck supple. No thyromegaly present.  Cardiovascular: Normal rate and regular rhythm.   Pulmonary/Chest: Effort normal and breath sounds normal. No respiratory distress. He has no wheezes. He has no rales.  Musculoskeletal: He exhibits no edema.  Neurological: He is  alert and oriented to person, place, and time.          Assessment & Plan:  Recent atypical chest pain. Ruled out for MI. His symptoms did not sound cardiac. CT angiogram unremarkable. He'll ease back into his regular exercise routine. Followup immediately if he has exertional chest pain or current symptoms.

## 2014-09-12 ENCOUNTER — Telehealth: Payer: Self-pay | Admitting: Cardiovascular Disease

## 2014-09-12 ENCOUNTER — Telehealth: Payer: Self-pay | Admitting: Family Medicine

## 2014-09-12 NOTE — Telephone Encounter (Signed)
Pt called in stating that his BP has been very low for the past couple of day. He says yesterday it was 93/52 and today it is 98/44. He would like to seen asap. Please call  Thanks

## 2014-09-12 NOTE — Telephone Encounter (Signed)
Spoke with patient and he says the last few days his BP has been low.  He is on Losartan 100mg  that was started by his PCP and I have suggested that he decrease that to 50mg  daily and call Dr Elease Hashimoto to discuss.  He is going to do soa nd keep a log of his BP with the decreased dose

## 2014-09-12 NOTE — Telephone Encounter (Signed)
Error

## 2014-09-13 ENCOUNTER — Ambulatory Visit (INDEPENDENT_AMBULATORY_CARE_PROVIDER_SITE_OTHER): Payer: Medicare Other | Admitting: Family Medicine

## 2014-09-13 ENCOUNTER — Encounter: Payer: Self-pay | Admitting: Family Medicine

## 2014-09-13 VITALS — BP 132/60 | HR 87 | Temp 98.1°F | Wt 199.0 lb

## 2014-09-13 DIAGNOSIS — I10 Essential (primary) hypertension: Secondary | ICD-10-CM

## 2014-09-13 DIAGNOSIS — E1165 Type 2 diabetes mellitus with hyperglycemia: Secondary | ICD-10-CM

## 2014-09-13 DIAGNOSIS — IMO0002 Reserved for concepts with insufficient information to code with codable children: Secondary | ICD-10-CM | POA: Insufficient documentation

## 2014-09-13 DIAGNOSIS — E785 Hyperlipidemia, unspecified: Secondary | ICD-10-CM

## 2014-09-13 NOTE — Progress Notes (Signed)
Pre visit review using our clinic review tool, if applicable. No additional management support is needed unless otherwise documented below in the visit note. 

## 2014-09-13 NOTE — Progress Notes (Signed)
   Subjective:    Patient ID: Kevin [name], male    DOB: 06-Jan-1946, 68 y.o.   MRN: [id number]  HPI Patient here with concerns about blood pressure. Apparently by his home wrist monitor he had some low diastolics down in the 40 range. He's not had any clear orthostatic type symptoms. He takes losartan and carvedilol. He is compliant with medications. In scrolling through some home blood pressures his blood pressure varies fairly considerably but his systolics have been too low. Mostly ranging between 110 range to about 140.  He has type 2 diabetes and is overdue for lab work. Hyperlipidemia treated with lovastatin. He had episode of chest pain about 10 or 15 days ago which was evaluated to ER. None since then.  Past Medical History  Diagnosis Date  . Diabetes mellitus   . Chronic kidney disease     stones  . UTI (urinary tract infection)   . Peri-rectal abscess   . Sinus bradycardia   . Anemia   . Pacemaker   . Blood transfusion     "in [place]"  . Nonischemic cardiomyopathy   . History of colon polyps   . RBBB   . 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 Permanent pacemaker insertion  12/01/11    [employer]tr[employer]  . Inguinal hernia repair  1980    left  . Tonsillectomy    . Shrapnel surgery      "in [place]"  . Examination under anesthesia  04/06/2012    Procedure: EXAM UNDER ANESTHESIA;  Surgeon: Marcello Moores A. Cornett, MD;  Location: [place];  Service: General;  Laterality: N/A;  . Nm myocar perf wall motion  01/21/2012    mod-severe perfusion defect due to infarct/scar w/mild perinfarct ischemia in the apical,basal inferoseptal,basal inferior,mid inferoseptal,midinferior and apical inferior regions  . Cardiac catheterization  01/27/2012    normal coronaries, dilated LV c/w nonischemic cardiomyopathy    reports that he quit smoking about 30 years ago. His smoking use included Cigarettes. He has a 20  pack-year smoking history. He has never used smokeless tobacco. He reports that he drinks alcohol. He reports that he does not use illicit drugs. family history includes Leukemia in his father; Ovarian cancer in his mother. Allergies  Allergen Reactions  . Sulfa Antibiotics Swelling    Swelling of lips only.      Review of Systems  Constitutional: Negative for fatigue.  Eyes: Negative for visual disturbance.  Respiratory: Negative for cough, chest tightness and shortness of breath.   Cardiovascular: Negative for chest pain, palpitations and leg swelling.  Neurological: Negative for dizziness, syncope, weakness, light-headedness and headaches.       Objective:   Physical Exam  Constitutional: He appears well-developed and well-nourished.  Cardiovascular: Normal rate and regular rhythm.   Pulmonary/Chest: Effort normal and breath sounds normal. No respiratory distress. He has no wheezes. He has no rales.          Assessment & Plan:  Hypertension. Stable. No evidence for orthostatic problems. Continue current dosage of antihypertensives. We have scheduled future labs for fasting lipid, hepatic, A1c, and basic metabolic panel and he will get these next week

## 2014-09-16 ENCOUNTER — Telehealth: Payer: Self-pay | Admitting: Family Medicine

## 2014-09-16 ENCOUNTER — Other Ambulatory Visit (INDEPENDENT_AMBULATORY_CARE_PROVIDER_SITE_OTHER): Payer: Medicare Other

## 2014-09-16 DIAGNOSIS — IMO0002 Reserved for concepts with insufficient information to code with codable children: Secondary | ICD-10-CM

## 2014-09-16 DIAGNOSIS — E1165 Type 2 diabetes mellitus with hyperglycemia: Secondary | ICD-10-CM

## 2014-09-16 DIAGNOSIS — I10 Essential (primary) hypertension: Secondary | ICD-10-CM

## 2014-09-16 DIAGNOSIS — E785 Hyperlipidemia, unspecified: Secondary | ICD-10-CM

## 2014-09-16 LAB — BASIC METABOLIC PANEL
BUN: 12 mg/dL (ref 6–23)
CALCIUM: 9 mg/dL (ref 8.4–10.5)
CO2: 25 meq/L (ref 19–32)
Chloride: 106 mEq/L (ref 96–112)
Creatinine, Ser: 0.8 mg/dL (ref 0.4–1.5)
GFR: 104.95 mL/min (ref >60.00)
GLUCOSE: 143 mg/dL — AB (ref 70–99)
POTASSIUM: 4.5 meq/L (ref 3.5–5.1)
Sodium: 143 mEq/L (ref 135–145)

## 2014-09-16 LAB — HEMOGLOBIN A1C: Hgb A1c MFr Bld: 7.2 % — ABNORMAL HIGH (ref 4.6–6.5)

## 2014-09-16 LAB — HEPATIC FUNCTION PANEL
ALBUMIN: 3.3 g/dL — AB (ref 3.5–5.2)
ALK PHOS: 56 U/L (ref 39–117)
ALT: 9 U/L (ref 0–53)
AST: 19 U/L (ref 0–37)
Bilirubin, Direct: 0.1 mg/dL (ref 0.0–0.3)
TOTAL PROTEIN: 7.1 g/dL (ref 6.0–8.3)
Total Bilirubin: 0.7 mg/dL (ref 0.2–1.2)

## 2014-09-16 LAB — LIPID PANEL
CHOLESTEROL: 109 mg/dL (ref 0–200)
HDL: 29.6 mg/dL — ABNORMAL LOW (ref >39.00)
LDL Cholesterol: 67 mg/dL (ref 0–99)
NonHDL: 79.4
Total CHOL/HDL Ratio: 4
Triglycerides: 62 mg/dL (ref 0.0–149.0)
VLDL: 12.4 mg/dL (ref 0.0–40.0)

## 2014-09-16 NOTE — Telephone Encounter (Signed)
emmi emailed °

## 2014-09-18 ENCOUNTER — Encounter: Payer: Self-pay | Admitting: *Deleted

## 2014-09-24 ENCOUNTER — Other Ambulatory Visit (INDEPENDENT_AMBULATORY_CARE_PROVIDER_SITE_OTHER): Payer: Medicare Other

## 2014-09-24 DIAGNOSIS — E785 Hyperlipidemia, unspecified: Secondary | ICD-10-CM

## 2014-09-24 DIAGNOSIS — E119 Type 2 diabetes mellitus without complications: Secondary | ICD-10-CM

## 2014-09-24 LAB — URINALYSIS, ROUTINE W REFLEX MICROSCOPIC
BILIRUBIN URINE: NEGATIVE
Hgb urine dipstick: NEGATIVE
KETONES UR: NEGATIVE
LEUKOCYTES UA: NEGATIVE
Nitrite: NEGATIVE
Specific Gravity, Urine: >=1.03 — AB (ref 1.000–1.030)
Total Protein, Urine: NEGATIVE
UROBILINOGEN UA: 0.2 (ref 0.0–1.0)
Urine Glucose: NEGATIVE
pH: 5.5 (ref 5.0–8.0)

## 2014-09-24 LAB — LDL CHOLESTEROL, DIRECT: LDL DIRECT: 58.6 mg/dL

## 2014-09-24 LAB — HEMOGLOBIN A1C: Hgb A1c MFr Bld: 7.1 % — ABNORMAL HIGH (ref 4.6–6.5)

## 2014-09-25 LAB — BASIC METABOLIC PANEL
BUN: 13 mg/dL (ref 6–23)
CO2: 27 mEq/L (ref 19–32)
CREATININE: 0.9 mg/dL (ref 0.4–1.5)
Calcium: 8.8 mg/dL (ref 8.4–10.5)
Chloride: 107 mEq/L (ref 96–112)
GFR: 84.61 mL/min (ref >60.00)
Glucose, Bld: 223 mg/dL — ABNORMAL HIGH (ref 70–99)
Potassium: 4.2 mEq/L (ref 3.5–5.1)
Sodium: 141 mEq/L (ref 135–145)

## 2014-09-27 ENCOUNTER — Telehealth: Payer: Self-pay | Admitting: Cardiovascular Disease

## 2014-09-27 ENCOUNTER — Ambulatory Visit (INDEPENDENT_AMBULATORY_CARE_PROVIDER_SITE_OTHER): Payer: Medicare Other | Admitting: Endocrinology

## 2014-09-27 ENCOUNTER — Encounter: Payer: Self-pay | Admitting: Endocrinology

## 2014-09-27 VITALS — BP 128/82 | HR 84 | Temp 97.9°F | Resp 16 | Ht 75.0 in | Wt 200.0 lb

## 2014-09-27 DIAGNOSIS — E1165 Type 2 diabetes mellitus with hyperglycemia: Secondary | ICD-10-CM

## 2014-09-27 DIAGNOSIS — E785 Hyperlipidemia, unspecified: Secondary | ICD-10-CM

## 2014-09-27 DIAGNOSIS — IMO0002 Reserved for concepts with insufficient information to code with codable children: Secondary | ICD-10-CM

## 2014-09-27 NOTE — Telephone Encounter (Signed)
Patient stopped by to make an appointment with Dr. Sallyanne Kuster.  He has been having chest pains after exercising and would like to be seen earlier than January 2016.  He was in the ER two months ago and they could not find anything wrong with him.  He is wondering if there is something wrong with his pacemaker.  Please call patient back at [phone].

## 2014-09-27 NOTE — Progress Notes (Signed)
Patient ID: Kevin [name], male   DOB: 01-20-1946, 68 y.o.   MRN: [id number]    Reason for Appointment:  Followup for Type 2 Diabetes  Referring physician:  History of Present Illness:          Diagnosis: Type 2 diabetes mellitus, date of diagnosis: 2001        Past history: His previous records are not available but initially his symptoms included weight loss He thinks he has been on metformin for several years and more recently has been taking 1000 mg twice a day [facility] His A1c was 6.9 in 2013 but a year later was 8.1 He had been on glipizide 5 mg twice a day in addition to his metformin 1 g twice a day  Prior to his consultation when his A1c was 10%  Recent history:  On his consultation he was given Januvia in addition and his glipizide was changed to glipizide ER 5 mg daily  He is unclear whether he is taking a half or whole tablet of glipizide   Because of insurance difficulties he was able to get only the 100/1000 Janumet XR and was also taking metformin in addition   He claims that he was having difficulties with memory with metformin and stopped this on his own  He says he is using his Accu-Chek meter now but not clear how often.  His glucose by recall is higher in the mornings. His A1c has improved by about 1% compared to last year and near 7% now. Although his diet is overall fairly good he sometimes has high fat meals like biscuits Has been exercising fairly regularly Hypoglycemia: None recently       Oral hypoglycemic drugs the patient is taking are: Glipizide ER 5 mg qd, Janumet XR 100/1000 daily Side effects from medications have been: None  Glucose monitoring:  done 1-2 times a day         Glucometer:  Accu-Chek compact     Blood Glucose readings  by recall hs 124; acb upto 180  Glycemic control:    Lab Results  Component Value Date   HGBA1C 7.1* 09/24/2014   HGBA1C 7.2* 09/16/2014   HGBA1C 8.1*  11/07/2013   Lab Results  Component Value Date   LDLCALC 67 09/16/2014   CREATININE 0.9 09/24/2014    microalbumin not available  Self-care: The diet that the patient has been following is: tries to limit portions, may get fried food when eating out, eating out at fast food about 4 days a week  Meals: 3 meals per day.  breakfast is at 7-8 am and is usually eating a biscuit or special K. cereal Usually eating baked chicken or chicken salad sandwiches for meals and will have fruits for snacks         Exercise:  30-40 minutes, 3-4 days a week on treadmill         Dietician visit: Most recent:2001, at [facility] hospital.               Compliance with the medical regimen: Inconsistent Retinal exam: Most recent:. 3/15   Weight history: Wt Readings from Last 3 Encounters:  09/27/14 200 lb (90.719 kg)  09/13/14 199 lb (90.266 kg)  08/29/14 200 lb (90.719 kg)      Medication List       This list is accurate as of: 09/27/14  8:25 AM.  Always use your most recent med list.  aspirin 81 MG tablet  Take 81 mg by mouth daily.     buPROPion 150 MG 24 hr tablet  Commonly known as:  WELLBUTRIN XL  Take 150 mg by mouth daily.     carvedilol 6.25 MG tablet  Commonly known as:  COREG  Take 1 tablet (6.25 mg total) by mouth 2 (two) times daily with a meal.     clobetasol ointment 0.05 %  Commonly known as:  TEMOVATE  Apply 1 application topically as needed.     glipiZIDE 5 MG 24 hr tablet  Commonly known as:  GLUCOTROL XL  Take 1 tablet (5 mg total) by mouth daily with breakfast.     latanoprost 0.005 % ophthalmic solution  Commonly known as:  XALATAN  Place 1 drop into the right eye daily.     losartan 100 MG tablet  Commonly known as:  COZAAR  Take 1 tablet (100 mg total) by mouth daily.     lovastatin 10 MG tablet  Commonly known as:  MEVACOR  Take 10 mg by mouth at bedtime.     SitaGLIPtin-MetFORMIN HCl [id number] MG Tb24  Take 1 tablet daily         Allergies:  Allergies  Allergen Reactions  . Sulfa Antibiotics Swelling    Swelling of lips only.    Past Medical History  Diagnosis Date  . Diabetes mellitus   . Chronic kidney disease     stones  . UTI (urinary tract infection)   . Peri-rectal abscess   . Sinus bradycardia   . Anemia   . Pacemaker   . Blood transfusion     "in [place]"  . Nonischemic cardiomyopathy   . History of colon polyps   . RBBB   . Heart block AV second degree     permanent pacemaker 12/01/11 Medtronic    Past Surgical History  Procedure Laterality Date  . Permanent pacemaker insertion  12/01/11    [employer]tr[employer]  . Inguinal hernia repair  1980    left  . Tonsillectomy    . Shrapnel surgery      "in [place]"  . Examination under anesthesia  04/06/2012    Procedure: EXAM UNDER ANESTHESIA;  Surgeon: Marcello Moores A. Cornett, MD;  Location: [place];  Service: General;  Laterality: N/A;  . Nm myocar perf wall motion  01/21/2012    mod-severe perfusion defect due to infarct/scar w/mild perinfarct ischemia in the apical,basal inferoseptal,basal inferior,mid inferoseptal,midinferior and apical inferior regions  . Cardiac catheterization  01/27/2012    normal coronaries, dilated LV c/w nonischemic cardiomyopathy    Family History  Problem Relation Age of Onset  . Ovarian cancer Mother   . Leukemia Father     Social History:  reports that he quit smoking about 30 years ago. His smoking use included Cigarettes. He has a 20 pack-year smoking history. He has never used smokeless tobacco. He reports that he drinks alcohol. He reports that he does not use illicit drugs.    Review of Systems   He is asking about occasional chest pain after exercise      Lipids: He has been treated with Lipitor 40 mg now, also has labs done at [facility]       Lab Results  Component Value Date   CHOL 109 09/16/2014   HDL 29.60* 09/16/2014   LDLCALC 67 09/16/2014   LDLDIRECT 58.6 09/24/2014   TRIG 62.0 09/16/2014    CHOLHDL 4 09/16/2014        He  has had difficulties with his memory and he thinks this is better  Hypertension: Mild and well controlled with losartan      Physical Examination:  BP 128/82 mmHg  Pulse 84  Temp(Src) 97.9 F (36.6 C)  Resp 16  Ht 6\' 3"  (1.905 m)  Wt 200 lb (90.719 kg)  BMI 25.00 kg/m2  SpO2 96%  No edema  ASSESSMENT:  Diabetes type 2, uncontrolled    Patient has overall improved blood sugar control with using Janumet XR and glipizide ER His A1c is just over 7% He thinks his fasting blood sugars are relatively high Lab glucose was over 200 after eating a biscuit Also he has not brought his home glucose readings for confirming his level of control and he is not able to remember his readings well Do not think he had any hypoglycemia with glipizide but again not clear if he is taking half or full tablet of glipizide ER He is overall doing fairly well with regular exercise Weight has levered off  HYPERLIPIDEMIA: Appears well controlled  PLAN:   Continue same medications including Janumet XR and glipizide ER; he would take his glipizide ER as prescribed and not half a tablet  Continue exercise regimen  Needs better diet, discussed  Bring blood sugar monitor for download on each visit Discussed frequency and timing of blood sugar monitoring and targets  Consultation with dietitian recommended but he is reluctant to do this  Make appointment with cardiologist for evaluation of chest pain   Terral Cooks 09/27/2014, 8:25 AM     Note: This office note was prepared with Dragon voice recognition system technology. Any transcriptional errors that result from this process are unintentional.

## 2014-09-27 NOTE — Patient Instructions (Addendum)
Please check blood sugars at least half the time about 2 hours after any meal and 3 times per week on waking up.  Please bring blood sugar monitor to each visit  Take full tab of Glipizide Er  Avoid biscuits and high fat meals

## 2014-10-17 ENCOUNTER — Encounter (HOSPITAL_COMMUNITY): Payer: Self-pay | Admitting: Cardiovascular Disease

## 2014-10-24 ENCOUNTER — Other Ambulatory Visit: Payer: Self-pay | Admitting: *Deleted

## 2014-10-24 MED ORDER — SITAGLIP PHOS-METFORMIN HCL ER 100-1000 MG PO TB24: ORAL_TABLET | ORAL | Status: DC

## 2014-11-04 ENCOUNTER — Other Ambulatory Visit: Payer: Self-pay | Admitting: Family Medicine

## 2014-11-18 ENCOUNTER — Ambulatory Visit (INDEPENDENT_AMBULATORY_CARE_PROVIDER_SITE_OTHER): Payer: Medicare HMO | Admitting: Cardiovascular Disease

## 2014-11-18 ENCOUNTER — Encounter: Payer: Self-pay | Admitting: Cardiovascular Disease

## 2014-11-18 VITALS — BP 134/62 | HR 86 | Ht 75.0 in | Wt 200.4 lb

## 2014-11-18 DIAGNOSIS — Z950 Presence of cardiac pacemaker: Secondary | ICD-10-CM

## 2014-11-18 DIAGNOSIS — I472 Ventricular tachycardia, unspecified: Secondary | ICD-10-CM

## 2014-11-18 DIAGNOSIS — Z79899 Other long term (current) drug therapy: Secondary | ICD-10-CM

## 2014-11-18 DIAGNOSIS — E785 Hyperlipidemia, unspecified: Secondary | ICD-10-CM

## 2014-11-18 DIAGNOSIS — R079 Chest pain, unspecified: Secondary | ICD-10-CM

## 2014-11-18 DIAGNOSIS — I429 Cardiomyopathy, unspecified: Secondary | ICD-10-CM

## 2014-11-18 DIAGNOSIS — I10 Essential (primary) hypertension: Secondary | ICD-10-CM

## 2014-11-18 LAB — MDC_IDC_ENUM_SESS_TYPE_INCLINIC
Battery Impedance: 185 Ohm
Battery Remaining Longevity: 150 mo
Battery Voltage: 2.78 V
Brady Statistic AP VP Percent: 0 %
Brady Statistic AP VS Percent: 0 %
Brady Statistic AS VP Percent: 7 %
Date Time Interrogation Session: 20160111090021
Lead Channel Impedance Value: 451 Ohm
Lead Channel Impedance Value: 495 Ohm
Lead Channel Pacing Threshold Amplitude: 0.75 V
Lead Channel Pacing Threshold Amplitude: 1 V
Lead Channel Pacing Threshold Pulse Width: 0.4 ms
Lead Channel Sensing Intrinsic Amplitude: 2 mV
Lead Channel Setting Pacing Amplitude: 1.5 V
Lead Channel Setting Pacing Amplitude: 2 V
Lead Channel Setting Sensing Sensitivity: 4 mV
MDC IDC MSMT LEADCHNL RV PACING THRESHOLD PULSEWIDTH: 0.4 ms
MDC IDC MSMT LEADCHNL RV SENSING INTR AMPL: 11.2 mV
MDC IDC SET LEADCHNL RV PACING PULSEWIDTH: 0.4 ms
MDC IDC STAT BRADY AS VS PERCENT: 93 %

## 2014-11-18 MED ORDER — FUROSEMIDE 40 MG PO TABS: 40.0000 mg | ORAL_TABLET | Freq: Every day | ORAL | Status: DC

## 2014-11-18 MED ORDER — SPIRONOLACTONE 25 MG PO TABS: 12.5000 mg | ORAL_TABLET | Freq: Every day | ORAL | Status: DC

## 2014-11-18 MED ORDER — CARVEDILOL 12.5 MG PO TABS: 12.5000 mg | ORAL_TABLET | Freq: Two times a day (BID) | ORAL | Status: DC

## 2014-11-18 NOTE — Patient Instructions (Addendum)
  START Furosemide 40mg  take one tablet each morning.  START Aldactone (Spironolactone) 25mg  - take 1/2 tablet daily.  INCREASE Carvedilol to 12.5mg  twice a day.  (11/25/2014)  Your physician recommends that you return for lab work in: 2 weeks - 12/03/2014 at [facility] lab.  You have been referred to Electrophysiology for evaluation of upgrade to a BIV ICD.  Dr. Sallyanne Kuster recommends that you schedule a follow-up appointment in: 4-6 weeks.

## 2014-11-18 NOTE — Progress Notes (Signed)
Patient ID: Kevin [name], male   DOB: 1946/09/01, 69 y.o.   MRN: [id number]      Reason for office visit Cardiomyopathy, Pacemaker followup  Kevin [name] returns for a pacemaker check and clinical followup for cardiomyopathy.   In the past Kevin [name] seemed to have asymptomatic cardiomyopathy but recently this has changed. He continues to exercise 3 days a week on his treadmill at an incline of 5%, working for 1-1-1/2 miles at roughly 4 miles per hour. On one occasion he has to stop working due to chest tightness. He denied dyspnea. More commonly has developed chest discomfort during sexual intercourse. He breathes extremely hard to soon as he stands up the sensations of dyspnea and chest discomfort eased quickly. Has had infrequent palpitations and some dizziness.  He has angiographically normal coronary arteries by a relatively recent cardiac catheterization. I took another look at the images today and there is no real visible plaque.  Electrocardiogram shows normal sinus rhythm and atypical left bundle branch block that is very broad with a QRS duration of 176 ms.  His device is a dual-chamber Medtronic Adapta pacemaker implanted in 2013 for alternating left and right bundle-branch block and intermittent second-degree atrioventricular block. He also has a history of diet controlled diabetes mellitus, HTN, erectile dysfunction and hyperlipidemia. He underwent coronary angiography in 2013 that showed no evidence of coronary stenoses. Despite this he had a dilated left ventricle with extensive areas of wall motion abnormality and an ejection fraction of 25%. Similarly his left ventricular ejection fraction was estimated to be 24% via nuclear scintigraphy. That study was interpreted as showing scar with peri-infarct ischemia in the inferior inferoseptal and apical distribution which was new when compared to a study from 2010. The nuclear stress test and cardiac catheterization were performed  subsequent to pacemaker implantation, it is possible that the pacing induced asynchrony may have been partly responsible for LV dysfunction and/or perfusion artifacts. To make this situation even more puzzling a followup echocardiogram performed in September of 2014 showed a left ventricular ejection fraction that was virtually normal at 50-55%. The report specifically states that the electrocardiogram showed active ventricular pacing at the time of the study but also states that there was "no intraventricular dyssynchrony seen". Finally, an echocardiogram done in January 2015 was interpreted as showing an ejection fraction of about 40% and showed severe hypokinesis of the apex and mild left atrial dilatation.   I took another look at all of his echocardiograms from 2009 onward. In 2009 he clearly had normal left ventricular systolic function and noticeably a normal narrow QRS complex. I think the ejection fraction was overestimated on the echo from September 2014. I think it was roughly 40% around that time as well, similar to the echocardiogram from January 2015.  Pacemaker interrogation shows normal device function. He actually has very little ventricular pacing only 7.3% and virtually never paces the atrium. Battery longevity is over 10 years. 3 episodes of nonsustained ventricular tachycardia have been recorded the longest consisting of 11 beats. One atrial high rate probably represented sinus tachycardia there has been no atrial fibrillation    Allergies  Allergen Reactions  . Sulfa Antibiotics Swelling    Swelling of lips only.    Current Outpatient Prescriptions  Medication Sig Dispense Refill  . aspirin 81 MG tablet Take 81 mg by mouth daily.      Marland Kitchen atorvastatin (LIPITOR) 40 MG tablet Take 40 mg by mouth daily.  3  . buPROPion (WELLBUTRIN XL)  150 MG 24 hr tablet Take 150 mg by mouth daily.    . carvedilol (COREG) 12.5 MG tablet Take 1 tablet (12.5 mg total) by mouth 2 (two) times daily  with a meal. 180 tablet 3  . clobetasol ointment (TEMOVATE) 8.09 % Apply 1 application topically as needed.     . cyclobenzaprine (FLEXERIL) 5 MG tablet TAKE ONE TABLET BY MOUTH THREE TIMES DAILY AS NEEDED FOR MUSCLE SPASMS 30 tablet 5  . glipiZIDE (GLUCOTROL XL) 5 MG 24 hr tablet Take 1 tablet (5 mg total) by mouth daily with breakfast. 30 tablet 5  . latanoprost (XALATAN) 0.005 % ophthalmic solution Place 1 drop into the right eye daily.     Marland Kitchen LORazepam (ATIVAN) 0.5 MG tablet Take 0.5 mg by mouth daily as needed for anxiety.    Marland Kitchen losartan (COZAAR) 100 MG tablet Take 1 tablet (100 mg total) by mouth daily. 90 tablet 3  . meloxicam (MOBIC) 15 MG tablet Take 15 mg by mouth daily.  1  . SitaGLIPtin-MetFORMIN HCl [phone] MG TB24 Take 1 tablet daily 30 tablet 4  . VIAGRA 100 MG tablet   11  . furosemide (LASIX) 40 MG tablet Take 1 tablet (40 mg total) by mouth daily with breakfast. 90 tablet 3  . spironolactone (ALDACTONE) 25 MG tablet Take 0.5 tablets (12.5 mg total) by mouth daily. 45 tablet 3   No current facility-administered medications for this visit.    Past Medical History  Diagnosis Date  . Diabetes mellitus   . Chronic kidney disease     stones  . UTI (urinary tract infection)   . Peri-rectal abscess   . Sinus bradycardia   . Anemia   . Pacemaker   . Blood transfusion     "in [place]"  . Nonischemic cardiomyopathy   . History of colon polyps   . RBBB   . Heart block AV second degree     permanent pacemaker 12/01/11 Medtronic    Past Surgical History  Procedure Laterality Date  . Permanent pacemaker insertion  12/01/11    Medtronic  . Inguinal hernia repair  1980    left  . Tonsillectomy    . Shrapnel surgery      "in [place]"  . Examination under anesthesia  04/06/2012    Procedure: EXAM UNDER ANESTHESIA;  Surgeon: Marcello Moores A. Cornett, MD;  Location: [facility];  Service: General;  Laterality: N/A;  . Nm myocar perf wall motion  01/21/2012    mod-severe perfusion defect due  to infarct/scar w/mild perinfarct ischemia in the apical,basal inferoseptal,basal inferior,mid inferoseptal,midinferior and apical inferior regions  . Cardiac catheterization  01/27/2012    normal coronaries, dilated LV c/w nonischemic cardiomyopathy  . Permanent pacemaker insertion N/A 12/01/2011    Procedure: PERMANENT PACEMAKER INSERTION;  Surgeon: Sanda Klein, MD;  Location: [facility] CATH LAB;  Service: Cardiovascular;  Laterality: N/A;  . Left heart catheterization with coronary angiogram N/A 01/27/2012    Procedure: LEFT HEART CATHETERIZATION WITH CORONARY ANGIOGRAM;  Surgeon: Troy Sine, MD;  Location: [facility] CATH LAB;  Service: Cardiovascular;  Laterality: N/A;    Family History  Problem Relation Age of Onset  . Ovarian cancer Mother   . Leukemia Father     History   Social History  . Marital Status: Married    Spouse Name: N/A    Number of Children: 0  . Years of Education: N/A   Occupational History  . [employer] banker    Social History Main Topics  .  Smoking status: Former Smoker -- 1.00 packs/day for 20 years    Types: Cigarettes    Quit date: 04/13/1984  . Smokeless tobacco: Never Used  . Alcohol Use: Yes     Comment: 12/01/11 "maybe 3 beers or glasses of wine /month"  . Drug Use: No  . Sexual Activity: Not on file   Other Topics Concern  . Not on file   Social History Narrative    Review of systems: The patient specifically denies any chest pain at rest, dyspnea at rest, orthopnea, paroxysmal nocturnal dyspnea, syncope, palpitations, focal neurological deficits, intermittent claudication, lower extremity edema, unexplained weight gain, cough, hemoptysis or wheezing.  The patient also denies abdominal pain, nausea, vomiting, dysphagia, diarrhea, constipation, polyuria, polydipsia, dysuria, hematuria, frequency, urgency, abnormal bleeding or bruising, fever, chills, unexpected weight changes, mood swings, change in skin or hair texture, change in voice quality,  auditory or visual problems, allergic reactions or rashes, new musculoskeletal complaints other than usual "aches and pains".   PHYSICAL EXAM BP 134/62 mmHg  Pulse 86  Ht 6\' 3"  (1.905 m)  Wt 200 lb 6.4 oz (90.901 kg)  BMI 25.05 kg/m2  General: Alert, oriented x3, no distress Head: no evidence of trauma, PERRL, EOMI, no exophtalmos or lid lag, no myxedema, no xanthelasma; normal ears, nose and oropharynx Neck: normal jugular venous pulsations and no hepatojugular reflux; brisk carotid pulses without delay and no carotid bruits Chest: clear to auscultation, no signs of consolidation by percussion or palpation, normal fremitus, symmetrical and full respiratory excursions Cardiovascular: normal position and quality of the apical impulse, regular rhythm, normal first and paradoxically split second heart sounds, no murmurs, rubs or gallops Abdomen: no tenderness or distention, no masses by palpation, no abnormal pulsatility or arterial bruits, normal bowel sounds, no hepatosplenomegaly Extremities: no clubbing, cyanosis or edema; 2+ radial, ulnar and brachial pulses bilaterally; 2+ right femoral, posterior tibial and dorsalis pedis pulses; 2+ left femoral, posterior tibial and dorsalis pedis pulses; no subclavian or femoral bruits Neurological: grossly nonfocal   EKG: Sinus rhythm, left bundle branch block, QRS 176 ms  Lipid Panel     Component Value Date/Time   CHOL 109 09/16/2014 0803   TRIG 62.0 09/16/2014 0803   HDL 29.60* 09/16/2014 0803   CHOLHDL 4 09/16/2014 0803   VLDL 12.4 09/16/2014 0803   LDLCALC 67 09/16/2014 0803   LDLDIRECT 58.6 09/24/2014 0824    BMET    Component Value Date/Time   NA 141 09/24/2014 0824   K 4.2 09/24/2014 0824   CL 107 09/24/2014 0824   CO2 27 09/24/2014 0824   GLUCOSE 223* 09/24/2014 0824   BUN 13 09/24/2014 0824   CREATININE 0.9 09/24/2014 0824   CALCIUM 8.8 09/24/2014 0824   GFRNONAA 90* 08/28/2014 0844   GFRAA >90 08/28/2014 0844      ASSESSMENT AND PLAN  I think that Mr. Bergum's chest discomfort is actually an expression of congestive heart failure. It occurs with exertion but appears more likely to occur with exertion in a supine rather than upright position. It promptly resolves when he stands up.  He has nonischemic cardiomyopathy with clean coronary arteries by cardiac catheterization in 2013. Estimation of left ventricular ejection fraction has varied broadly over the years but I think an accurate estimation is around 40%. There was a remarkable reduction in ejection fraction when his QRS became broad by development of LBBB suggesting that dyssynchrony is playing a big role in his cardiac problem.  He has a typical left bundle  branch block with an extremely broad QRS complex, well over 150 ms.  Will add loop diuretics as well as spironolactone low-dose and see him back in the clinic on short order. He is already receiving treatment with a beta blocker, but the dose can be optimized. Will ask him to increase the carvedilol dose after he has been taking the diuretics for a week to avoid heart failure exacerbation. He is on a maximum usual dose of valsartan.  I think he has almost all the features that predict good response to cardiac resynchronization therapy (nonischemic cardio myopathy, QRS greater than 150 ms, typical LBBB) and will refer him to electrophysiology for consideration of upgrade to CRT-P.  Above and beyond symptomatic relief, this may increase longevity as well.  Orders Placed This Encounter  Procedures  . Basic metabolic panel  . Ambulatory referral to Cardiac Electrophysiology  . Implantable device check  . EKG 12-Lead   Meds ordered this encounter  Medications  . LORazepam (ATIVAN) 0.5 MG tablet    Sig: Take 0.5 mg by mouth daily as needed for anxiety.  . furosemide (LASIX) 40 MG tablet    Sig: Take 1 tablet (40 mg total) by mouth daily with breakfast.    Dispense:  90 tablet    Refill:   3  . spironolactone (ALDACTONE) 25 MG tablet    Sig: Take 0.5 tablets (12.5 mg total) by mouth daily.    Dispense:  45 tablet    Refill:  3  . carvedilol (COREG) 12.5 MG tablet    Sig: Take 1 tablet (12.5 mg total) by mouth 2 (two) times daily with a meal.    Dispense:  180 tablet    Refill:  Maxwell Leighanne Adolph, MD, [place] [phone] office [phone] pager

## 2014-11-22 ENCOUNTER — Encounter: Payer: Self-pay | Admitting: Internal Medicine

## 2014-11-22 ENCOUNTER — Ambulatory Visit (INDEPENDENT_AMBULATORY_CARE_PROVIDER_SITE_OTHER): Payer: Medicare HMO | Admitting: Internal Medicine

## 2014-11-22 VITALS — BP 124/64 | HR 100 | Ht 75.0 in | Wt 194.0 lb

## 2014-11-22 DIAGNOSIS — I429 Cardiomyopathy, unspecified: Secondary | ICD-10-CM

## 2014-11-22 DIAGNOSIS — I495 Sick sinus syndrome: Secondary | ICD-10-CM

## 2014-11-22 DIAGNOSIS — I447 Left bundle-branch block, unspecified: Secondary | ICD-10-CM

## 2014-11-22 DIAGNOSIS — I428 Other cardiomyopathies: Secondary | ICD-10-CM

## 2014-11-22 DIAGNOSIS — R0602 Shortness of breath: Secondary | ICD-10-CM

## 2014-11-22 DIAGNOSIS — R0789 Other chest pain: Secondary | ICD-10-CM

## 2014-11-22 MED ORDER — FUROSEMIDE 40 MG PO TABS: 20.0000 mg | ORAL_TABLET | Freq: Every day | ORAL | Status: DC

## 2014-11-22 NOTE — Patient Instructions (Signed)
Your physician recommends that you schedule a follow-up appointment in: 6 weeks with Dr Rayann Heman   Your physician has requested that you have an echocardiogram. Echocardiography is a painless test that uses sound waves to create images of your heart. It provides your doctor with information about the size and shape of your heart and how well your heart's chambers and valves are working. This procedure takes approximately one hour. There are no restrictions for this procedure.   Your physician has requested that you have a lexiscan myoview. For further information please visit [email or web address]. Please follow instruction sheet, as given.  Your physician has recommended you make the following change in your medication:  1) decrease Furosemide to 20mg  daily

## 2014-11-24 DIAGNOSIS — I495 Sick sinus syndrome: Secondary | ICD-10-CM | POA: Insufficient documentation

## 2014-11-24 DIAGNOSIS — I447 Left bundle-branch block, unspecified: Secondary | ICD-10-CM | POA: Insufficient documentation

## 2014-11-24 DIAGNOSIS — R0602 Shortness of breath: Secondary | ICD-10-CM | POA: Insufficient documentation

## 2014-11-24 NOTE — Progress Notes (Signed)
Electrophysiology Office Note   Date:  11/24/2014   ID:  Toby, Breithaupt [birth year], MRN [id number]  PCP:  [name], MD  Cardiologist:  Dr Sallyanne Kuster   Chief Complaint  Patient presents with  . Shortness of Breath     History of Present Illness: Kevin [name] is a 69 y.o. male who presents today for electrophysiology evaluation.   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  He underwent cath in 2013 which did not reveals obstructive CAD.  His EF was previously mildly depressed (40%).  He has had gradual worsening of symptoms of shortness of breath and fatigue.  He remains active.  He reports chest tightness/ pressure with moderate activity over the past few weeks.  He also feels that his SOB is abruptly worse.  He finds that his SOB is most noticeable during sexual intercourse.  He was recently evaluated by Dr Sallyanne Kuster and is referred for consideration of upgrade of his pacemaker to a CRT-P device.  His last echo was 1/15.  He has not had recent functional assessment.   Today, he denies symptoms of palpitations, lower extremity edema, claudication, dizziness, presyncope, syncope, bleeding, or neurologic sequela. The patient is tolerating medications without difficulties and is otherwise without complaint today.    Past Medical History  Diagnosis Date  . Diabetes mellitus   . Renal calculi     remote  . UTI (urinary tract infection)     15-20 years ago  . Peri-rectal abscess     5 years ago  . Sinus bradycardia   . Anemia   . Pacemaker   . Blood transfusion     "in [place]"  . Nonischemic cardiomyopathy   . History of colon polyps   . RBBB   . 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 Permanent pacemaker insertion  12/01/11    Medtronic implanted by Dr Sallyanne Kuster  . Inguinal hernia repair  1980    left  . Tonsillectomy    . Shrapnel  surgery      "in [place]"  . Examination under anesthesia  04/06/2012    Procedure: EXAM UNDER ANESTHESIA;  Surgeon: Marcello Moores A. Cornett, MD;  Location: [facility];  Service: General;  Laterality: N/A;  . Nm myocar perf wall motion  01/21/2012    mod-severe perfusion defect due to infarct/scar w/mild perinfarct ischemia in the apical,basal inferoseptal,basal inferior,mid inferoseptal,midinferior and apical inferior regions  . Cardiac catheterization  01/27/2012    normal coronaries, dilated LV c/w nonischemic cardiomyopathy  . Permanent pacemaker insertion N/A 12/01/2011    Procedure: PERMANENT PACEMAKER INSERTION;  Surgeon: Sanda Klein, MD;  Location: [facility] CATH LAB;  Service: Cardiovascular;  Laterality: N/A;  . Left heart catheterization with coronary angiogram N/A 01/27/2012    Procedure: LEFT HEART CATHETERIZATION WITH CORONARY ANGIOGRAM;  Surgeon: Troy Sine, MD;  Location: [facility] C[facility] LAB;  Service: Cardiovascular;  Laterality: N/A;     Current Outpatient Prescriptions  Medication Sig Dispense Refill  . aspirin 81 MG tablet Take 81 mg by mouth daily.      Marland Kitchen atorvastatin (LIPITOR) 40 MG tablet Take 40 mg by mouth daily.  3  . buPROPion (WELLBUTRIN XL) 150 MG 24 hr tablet Take 150 mg by mouth daily.    . carvedilol (COREG) 12.5 MG tablet Take 1 tablet (12.5 mg total) by mouth 2 (two)  times daily with a meal. 180 tablet 3  . clobetasol ointment (TEMOVATE) 6.60 % Apply 1 application topically as needed.     . cyclobenzaprine (FLEXERIL) 5 MG tablet TAKE ONE TABLET BY MOUTH THREE TIMES DAILY AS NEEDED FOR MUSCLE SPASMS 30 tablet 5  . furosemide (LASIX) 40 MG tablet Take 0.5 tablets (20 mg total) by mouth daily with breakfast. 90 tablet 3  . glipiZIDE (GLUCOTROL XL) 5 MG 24 hr tablet Take 1 tablet (5 mg total) by mouth daily with breakfast. 30 tablet 5  . latanoprost (XALATAN) 0.005 % ophthalmic solution Place 1 drop into the right eye daily.     Marland Kitchen LORazepam (ATIVAN) 0.5 MG tablet Take 0.5 mg by mouth  daily as needed for anxiety.    Marland Kitchen losartan (COZAAR) 100 MG tablet Take 1 tablet (100 mg total) by mouth daily. 90 tablet 3  . meloxicam (MOBIC) 15 MG tablet Take 15 mg by mouth as needed.   1  . SitaGLIPtin-MetFORMIN HCl [phone] MG TB24 Take 1 tablet daily 30 tablet 4  . spironolactone (ALDACTONE) 25 MG tablet Take 0.5 tablets (12.5 mg total) by mouth daily. 45 tablet 3  . VIAGRA 100 MG tablet   11   No current facility-administered medications for this visit.    Allergies:   Sulfa antibiotics   Social History:  The patient  reports that he quit smoking about 30 years ago. His smoking use included Cigarettes. He has a 20 pack-year smoking history. He has never used smokeless tobacco. He reports that he drinks alcohol. He reports that he does not use illicit drugs.   Family History:  The patient's  family history includes Leukemia in his father; Ovarian cancer in his mother.    ROS:  Please see the history of present illness.  All other systems are reviewed and negative.    PHYSICAL EXAM: VS:  BP 124/64 mmHg  Pulse 100  Ht 6\' 3"  (1.905 m)  Wt 194 lb (87.998 kg)  BMI 24.25 kg/m2 , BMI Body mass index is 24.25 kg/(m^2). GEN: Well nourished, well developed, in no acute distress HEENT: normal Neck: no JVD, carotid bruits, or masses Cardiac: RRR; no murmurs, rubs, or gallops,no edema  Respiratory:  clear to auscultation bilaterally, normal work of breathing GI: soft, nontender, nondistended, + BS MS: no deformity or atrophy Skin: warm and dry, device pocket is well healed Neuro:  Strength and sensation are intact Psych: euthymic mood, full affect  EKG:  EKG is ordered today. The ekg ordered today shows sinus tachycardia 100 bpm with LBBB (QRS >150 msec).  Device interrogation is reviewed today in detail.  See PaceArt for details.   Recent Labs: 08/28/2014: Hemoglobin 15.3; Platelets 163 09/16/2014: ALT 9 09/24/2014: BUN 13; Creatinine 0.9; Potassium 4.2; Sodium 141    Lipid  Panel     Component Value Date/Time   CHOL 109 09/16/2014 0803   TRIG 62.0 09/16/2014 0803   HDL 29.60* 09/16/2014 0803   CHOLHDL 4 09/16/2014 0803   VLDL 12.4 09/16/2014 0803   LDLCALC 67 09/16/2014 0803   LDLDIRECT 58.6 09/24/2014 0824     Wt Readings from Last 3 Encounters:  11/22/14 194 lb (87.998 kg)  11/18/14 200 lb 6.4 oz (90.901 kg)  09/27/14 200 lb (90.719 kg)      Other studies Reviewed: Additional studies/ records that were reviewed today include: prior cath 2013, implant note 2013, myoview 2013, and Dr [name]'s notes as above   ASSESSMENT AND PLAN:  1.  Chronic systolic dysfunction with recent worsening of SOB. LBBB He has presumed nonischemic CM.    He has lost 6 lbs since initiation of lasix and appears dry on exam today. I have therefore decreased lasix to 20mg  daily. He has not had an echo in a year.  I will therefore order an echo to further evaluate his LV function. Given recent abrupt worsening in SOB and chest discomfort, I have ordered a lexiscan myoview today. I will defer any decision about device upgrade until these studies are complete.  2. 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 Myocardial Perfusion Imaging  . 2D Echocardiogram without contrast    Return in 6 weeks for further discussion   Signed, [name], [place] [address] [phone] (office) [phone] (fax)

## 2014-11-27 ENCOUNTER — Encounter: Payer: Self-pay | Admitting: Internal Medicine

## 2014-11-27 ENCOUNTER — Encounter: Payer: Self-pay | Admitting: Cardiovascular Disease

## 2014-12-02 ENCOUNTER — Encounter (HOSPITAL_COMMUNITY): Payer: Medicare HMO

## 2014-12-02 ENCOUNTER — Ambulatory Visit (HOSPITAL_COMMUNITY): Payer: Medicare HMO | Attending: Internal Medicine | Admitting: Radiology

## 2014-12-02 ENCOUNTER — Ambulatory Visit (HOSPITAL_BASED_OUTPATIENT_CLINIC_OR_DEPARTMENT_OTHER): Payer: Medicare HMO | Admitting: Cardiology

## 2014-12-02 ENCOUNTER — Other Ambulatory Visit (HOSPITAL_COMMUNITY): Payer: Medicare HMO

## 2014-12-02 DIAGNOSIS — E785 Hyperlipidemia, unspecified: Secondary | ICD-10-CM | POA: Diagnosis not present

## 2014-12-02 DIAGNOSIS — R0602 Shortness of breath: Secondary | ICD-10-CM

## 2014-12-02 DIAGNOSIS — R0789 Other chest pain: Secondary | ICD-10-CM

## 2014-12-02 DIAGNOSIS — I10 Essential (primary) hypertension: Secondary | ICD-10-CM | POA: Insufficient documentation

## 2014-12-02 DIAGNOSIS — I428 Other cardiomyopathies: Secondary | ICD-10-CM

## 2014-12-02 DIAGNOSIS — I447 Left bundle-branch block, unspecified: Secondary | ICD-10-CM | POA: Insufficient documentation

## 2014-12-02 DIAGNOSIS — R002 Palpitations: Secondary | ICD-10-CM | POA: Insufficient documentation

## 2014-12-02 DIAGNOSIS — I429 Cardiomyopathy, unspecified: Secondary | ICD-10-CM

## 2014-12-02 MED ORDER — TECHNETIUM TC 99M SESTAMIBI GENERIC - CARDIOLITE
33.0000 | Freq: Once | INTRAVENOUS | Status: AC | PRN
  Administered 2014-12-02: 33 via INTRAVENOUS

## 2014-12-02 MED ORDER — ADENOSINE (DIAGNOSTIC) 3 MG/ML IV SOLN
0.5600 mg/kg | Freq: Once | INTRAVENOUS | Status: AC
  Administered 2014-12-02: 50.1 mg via INTRAVENOUS

## 2014-12-02 MED ORDER — TECHNETIUM TC 99M SESTAMIBI GENERIC - CARDIOLITE
11.0000 | Freq: Once | INTRAVENOUS | Status: AC | PRN
  Administered 2014-12-02: 11 via INTRAVENOUS

## 2014-12-02 NOTE — Progress Notes (Signed)
Echo performed. 

## 2014-12-02 NOTE — Progress Notes (Signed)
[facility] [address] [place], [place] [address] [phone]    Cardiology Nuclear Med Study  Kevin [name] is a 69 y.o. male     MRN : [id number]     DOB: 02-16-1946  Procedure Date: 12/02/2014  Nuclear Med Background Indication for Stress Test:  Evaluation for Ischemia History:  CAD, '13 MPI: EF: 24% NL, PTVP, SSS, VTACH Cardiac Risk Factors: Hypertension, LBBB and Lipids  Symptoms:  Palpitations and SOB   Nuclear Pre-Procedure Caffeine/Decaff Intake:  None NPO After: 7:00pm   Lungs:  clear O2 Sat: 97% on room air. IV 0.9% NS with Angio Cath:  22g  IV Site: R Hand  IV Started by:  Matilde Haymaker, RN  Chest Size (in):  42 Cup Size: n/a  Height: 6\' 3"  (1.905 m)  Weight:  197 lb (89.359 kg)  BMI:  Body mass index is 24.62 kg/(m^2). Tech Comments:  Coreg taken 0730     Nuclear Med Study 1 or 2 day study: 1 day  Stress Test Type:  Adenosine  Reading MD: n/a  Order Authorizing Provider:  Trude Mcburney  Resting Radionuclide: Technetium 69m Sestamibi  Resting Radionuclide Dose: 11.0 mCi   Stress Radionuclide:  Technetium 85m Sestamibi  Stress Radionuclide Dose: 33.0 mCi           Stress Protocol Rest HR: 81 Stress HR: 87  Rest BP: 116/64 Stress BP: 119/57  Exercise Time (min): n/a METS: n/a   Predicted Max HR: 151 bpm % Max HR: 57.62 bpm Rate Pressure Product: 10353   Dose of Adenosine (mg):  50.1 Dose of Lexiscan: n/a mg  Dose of Atropine (mg): n/a Dose of Dobutamine: n/a mcg/kg/min (at max HR)  Stress Test Technologist: Perrin Maltese, EMT-P  Nuclear Technologist:  Earl Many, CNMT     Rest Procedure:  Myocardial perfusion imaging was performed at rest 45 minutes following the intravenous administration of Technetium 84m Sestamibi. Rest ECG: Normal sinus rhythm. Left bundle branch block.  Stress Procedure:  The patient received IV adenosine at 140 mcg/kg/min for 4 minutes. This patient was flushed, warm, sob, and  chest tightness.  Technetium 40m Sestamibi was injected at the 2 minute mark and quantitative spect images were obtained after a 45 minute delay. Stress ECG: No significant change from baseline ECG  QPS Raw Data Images:  Normal; no motion artifact; normal heart/lung ratio. Stress Images:  There is a large area of moderately severe decreased uptake affecting the mid/apical inferior segments, mid inferoseptal segment, mid anteroseptal segment, apical septal segment, apical cap, apical anterior segment, and apical lateral segment. Rest Images:  Rest images show the same defects as the stress images with the exception of reversibility in the mid inferior segment and apical lateral segment. Subtraction (SDS):  There is reversibility in the mid inferior segment and apical lateral segment. Transient Ischemic Dilatation (Normal <1.22):  0.98 Lung/Heart Ratio (Normal <0.45):  0.29  Quantitative Gated Spect Images QGS EDV:  268 ml QGS ESV:  203 ml  Impression Exercise Capacity:  Adenosine study with no exercise. BP Response:  Normal blood pressure response. Clinical Symptoms:  Chest tightness ECG Impression:  No significant ST segment change suggestive of ischemia. Comparison with Prior Nuclear Study:  The study is compared with the report of a study from December, 2014.  Overall Impression:  This is a high risk scan with severe left ventricular dysfunction. Left ventricular dysfunction appears to be similar to the study of 2014. There is a  large area of scar affecting the inferior wall, septum, inferolateral wall, and apex. There is mild peri-infarct ischemia affecting the apical lateral segment and a small part of the inferior wall. These findings are also similar to the reported findings from December, 2014. The left ventricle is dilated. The volume measurements are somewhat larger than the study from December, 2014.  LV Ejection Fraction: 24%.  LV Wall Motion:  There is global left ventricular  dysfunction. Severe wall motion abnormalities affect the septum, inferior wall, inferolateral wall, and apex.  Dola Argyle, MD

## 2014-12-09 ENCOUNTER — Ambulatory Visit (INDEPENDENT_AMBULATORY_CARE_PROVIDER_SITE_OTHER): Payer: Medicare HMO | Admitting: Family Medicine

## 2014-12-09 ENCOUNTER — Encounter: Payer: Self-pay | Admitting: Family Medicine

## 2014-12-09 VITALS — BP 110/70 | HR 73 | Temp 98.4°F | Wt 192.0 lb

## 2014-12-09 DIAGNOSIS — R05 Cough: Secondary | ICD-10-CM

## 2014-12-09 DIAGNOSIS — R059 Cough, unspecified: Secondary | ICD-10-CM

## 2014-12-09 NOTE — Progress Notes (Signed)
Pre visit review using our clinic review tool, if applicable. No additional management support is needed unless otherwise documented below in the visit note. 

## 2014-12-09 NOTE — Progress Notes (Signed)
   Subjective:    Patient ID: Kevin [name], male    DOB: 1946-03-24, 69 y.o.   MRN: [id number]  HPI Patient seen with cough. Onset about one week ago. He denies any fever or shortness of breath. No wheezing. Mild nasal congestion. No focal sinus pressure. Nonsmoker. Patient had several cardiac tests recently. Echocardiogram result reduced ejection fraction and abnormal nuclear stress test. He denies any chest pain. Still exercising without any recent exercise intolerance. Cough is been fairly well controlled with over-the-counter medications.  Past Medical History  Diagnosis Date  . Diabetes mellitus   . Renal calculi     remote  . UTI (urinary tract infection)     15-20 years ago  . Peri-rectal abscess     5 years ago  . Sinus bradycardia   . Anemia   . Pacemaker   . Blood transfusion     "in [place]"  . Nonischemic cardiomyopathy   . History of colon polyps   . RBBB   . 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 Permanent pacemaker insertion  12/01/11    Medtronic implanted by Dr Sallyanne Kuster  . Inguinal hernia repair  1980    left  . Tonsillectomy    . Shrapnel surgery      "in [place]"  . Examination under anesthesia  04/06/2012    Procedure: EXAM UNDER ANESTHESIA;  Surgeon: Marcello Moores A. Cornett, MD;  Location: [facility];  Service: General;  Laterality: N/A;  . Nm myocar perf wall motion  01/21/2012    mod-severe perfusion defect due to infarct/scar w/mild perinfarct ischemia in the apical,basal inferoseptal,basal inferior,mid inferoseptal,midinferior and apical inferior regions  . Cardiac catheterization  01/27/2012    normal coronaries, dilated LV c/w nonischemic cardiomyopathy  . Permanent pacemaker insertion N/A 12/01/2011    Procedure: PERMANENT PACEMAKER INSERTION;  Surgeon: Sanda Klein, MD;  Location: [facility] CATH LAB;  Service: Cardiovascular;  Laterality: N/A;  . Left heart catheterization  with coronary angiogram N/A 01/27/2012    Procedure: LEFT HEART CATHETERIZATION WITH CORONARY ANGIOGRAM;  Surgeon: Troy Sine, MD;  Location: [facility] CATH LAB;  Service: Cardiovascular;  Laterality: N/A;    reports that he quit smoking about 30 years ago. His smoking use included Cigarettes. He has a 20 pack-year smoking history. He has never used smokeless tobacco. He reports that he drinks alcohol. He reports that he does not use illicit drugs. family history includes Leukemia in his father; Ovarian cancer in his mother. Allergies  Allergen Reactions  . Sulfa Antibiotics Swelling    Swelling of lips only.      Review of Systems  Constitutional: Negative for fever, chills and unexpected weight change.  HENT: Positive for congestion.   Respiratory: Positive for cough. Negative for shortness of breath and wheezing.        Objective:   Physical Exam  Constitutional: He appears well-developed and well-nourished.  HENT:  Right Ear: External ear normal.  Left Ear: External ear normal.  Mouth/Throat: Oropharynx is clear and moist.  Cardiovascular: Normal rate and regular rhythm.   Pulmonary/Chest: Effort normal and breath sounds normal. No respiratory distress. He has no wheezes. He has no rales.          Assessment & Plan:  Cough. Suspect acute viral bronchitis. Nonfocal exam. Reassurance and continue over-the-counter cough medications. Follow-up promptly for fever or shortness of breath.

## 2014-12-09 NOTE — Patient Instructions (Signed)
Acute Bronchitis Bronchitis is inflammation of the airways that extend from the windpipe into the lungs (bronchi). The inflammation often causes mucus to develop. This leads to a cough, which is the most common symptom of bronchitis.  In acute bronchitis, the condition usually develops suddenly and goes away over time, usually in a couple weeks. Smoking, allergies, and asthma can make bronchitis worse. Repeated episodes of bronchitis may cause further lung problems.  CAUSES Acute bronchitis is most often caused by the same virus that causes a cold. The virus can spread from person to person (contagious) through coughing, sneezing, and touching contaminated objects. SIGNS AND SYMPTOMS   Cough.   Fever.   Coughing up mucus.   Body aches.   Chest congestion.   Chills.   Shortness of breath.   Sore throat.  DIAGNOSIS  Acute bronchitis is usually diagnosed through a physical exam. Your health care provider will also ask you questions about your medical history. Tests, such as chest X-rays, are sometimes done to rule out other conditions.  TREATMENT  Acute bronchitis usually goes away in a couple weeks. Oftentimes, no medical treatment is necessary. Medicines are sometimes given for relief of fever or cough. Antibiotic medicines are usually not needed but may be prescribed in certain situations. In some cases, an inhaler may be recommended to help reduce shortness of breath and control the cough. A cool mist vaporizer may also be used to help thin bronchial secretions and make it easier to clear the chest.  HOME CARE INSTRUCTIONS  Get plenty of rest.   Drink enough fluids to keep your urine clear or pale yellow (unless you have a medical condition that requires fluid restriction). Increasing fluids may help thin your respiratory secretions (sputum) and reduce chest congestion, and it will prevent dehydration.   Take medicines only as directed by your health care provider.  If  you were prescribed an antibiotic medicine, finish it all even if you start to feel better.  Avoid smoking and secondhand smoke. Exposure to cigarette smoke or irritating chemicals will make bronchitis worse. If you are a smoker, consider using nicotine gum or skin patches to help control withdrawal symptoms. Quitting smoking will help your lungs heal faster.   Reduce the chances of another bout of acute bronchitis by washing your hands frequently, avoiding people with cold symptoms, and trying not to touch your hands to your mouth, nose, or eyes.   Keep all follow-up visits as directed by your health care provider.  SEEK MEDICAL CARE IF: Your symptoms do not improve after 1 week of treatment.  SEEK IMMEDIATE MEDICAL CARE IF:  You develop an increased fever or chills.   You have chest pain.   You have severe shortness of breath.  You have bloody sputum.   You develop dehydration.  You faint or repeatedly feel like you are going to pass out.  You develop repeated vomiting.  You develop a severe headache. MAKE SURE YOU:   Understand these instructions.  Will watch your condition.  Will get help right away if you are not doing well or get worse. Document Released: 12/02/2004 Document Revised: 03/11/2014 Document Reviewed: 04/17/2013 [facility] Patient Information 2015 [place], [place]. This information is not intended to replace advice given to you by your health care provider. Make sure you discuss any questions you have with your health care provider.  Follow up for any fever or increased shortness of breath.

## 2015-01-02 ENCOUNTER — Encounter: Payer: Non-veteran care | Admitting: Cardiovascular Disease

## 2015-01-06 ENCOUNTER — Encounter: Payer: Self-pay | Admitting: *Deleted

## 2015-01-06 ENCOUNTER — Ambulatory Visit (INDEPENDENT_AMBULATORY_CARE_PROVIDER_SITE_OTHER): Payer: Medicare HMO | Admitting: Internal Medicine

## 2015-01-06 ENCOUNTER — Encounter: Payer: Self-pay | Admitting: Internal Medicine

## 2015-01-06 VITALS — BP 110/68 | HR 81 | Ht 75.0 in | Wt 200.4 lb

## 2015-01-06 DIAGNOSIS — I447 Left bundle-branch block, unspecified: Secondary | ICD-10-CM | POA: Diagnosis not present

## 2015-01-06 DIAGNOSIS — R0602 Shortness of breath: Secondary | ICD-10-CM

## 2015-01-06 DIAGNOSIS — I495 Sick sinus syndrome: Secondary | ICD-10-CM | POA: Diagnosis not present

## 2015-01-06 DIAGNOSIS — I428 Other cardiomyopathies: Secondary | ICD-10-CM

## 2015-01-06 DIAGNOSIS — I429 Cardiomyopathy, unspecified: Secondary | ICD-10-CM | POA: Diagnosis not present

## 2015-01-06 LAB — MDC_IDC_ENUM_SESS_TYPE_INCLINIC
Battery Voltage: 2.78 V
Brady Statistic AP VS Percent: 0 %
Brady Statistic AS VP Percent: 1 %
Brady Statistic AS VS Percent: 99 %
Date Time Interrogation Session: 20160229141303
Lead Channel Impedance Value: 433 Ohm
Lead Channel Impedance Value: 512 Ohm
Lead Channel Pacing Threshold Amplitude: 0.75 V
Lead Channel Pacing Threshold Pulse Width: 0.4 ms
Lead Channel Sensing Intrinsic Amplitude: 4 mV
Lead Channel Setting Pacing Amplitude: 2 V
Lead Channel Setting Pacing Amplitude: 2.5 V
Lead Channel Setting Pacing Pulse Width: 0.4 ms
Lead Channel Setting Sensing Sensitivity: 4 mV
MDC IDC MSMT BATTERY IMPEDANCE: 185 Ohm
MDC IDC MSMT BATTERY REMAINING LONGEVITY: 139 mo
MDC IDC MSMT LEADCHNL RV PACING THRESHOLD AMPLITUDE: 0.75 V
MDC IDC MSMT LEADCHNL RV PACING THRESHOLD PULSEWIDTH: 0.4 ms
MDC IDC MSMT LEADCHNL RV SENSING INTR AMPL: 11.2 mV
MDC IDC STAT BRADY AP VP PERCENT: 0 %

## 2015-01-06 NOTE — Patient Instructions (Signed)
Your physician recommends that you schedule a follow-up appointment in: 7-10 days in device clinic for wound check (after 01/22/15)  See instruction sheet attached

## 2015-01-06 NOTE — Progress Notes (Signed)
Electrophysiology Office Note   Date:  01/06/2015   ID:  [place], [place] [birth year], MRN [id number]  PCP:  [name], MD  Cardiologist:  Dr Sallyanne Kuster   Chief Complaint  Patient presents with  . Dizziness    CP, SOB & NICM     History of Present Illness: Kevin [name] is a 69 y.o. male who presents today for electrophysiology follow-up.   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  He underwent cath in 2013 which did not reveals obstructive CAD.  His EF was previously mildly depressed (40%).  He has had gradual worsening of symptoms of shortness of breath and fatigue.  He remains active.  He reports chest tightness/ pressure with moderate activity.  He also feels that his SOB is abruptly worse.  He finds that his SOB is most noticeable during sexual intercourse.   Last visit, I ordered echo and myoview.  The echo revealed EF 25-30%, myoview revealed large scar with peri-infarct ischemia unchanged from study 2014.Marland Kitchen   Past Medical History  Diagnosis Date  . Diabetes mellitus   . Renal calculi     remote  . UTI (urinary tract infection)     15-20 years ago  . Peri-rectal abscess     5 years ago  . Sinus bradycardia   . Anemia   . Pacemaker   . Blood transfusion     "in [place]"  . Nonischemic cardiomyopathy   . History of colon polyps   . LBBB (left bundle branch block)   . 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 Permanent pacemaker insertion  12/01/11    Medtronic implanted by Dr Sallyanne Kuster  . Inguinal hernia repair  1980    left  . Tonsillectomy    . Shrapnel surgery      "in [place]"  . Examination under anesthesia  04/06/2012    Procedure: EXAM UNDER ANESTHESIA;  Surgeon: Marcello Moores A. Cornett, MD;  Location: [facility];  Service: General;  Laterality: N/A;  . Nm myocar perf wall motion  01/21/2012    mod-severe perfusion defect due to  infarct/scar w/mild perinfarct ischemia in the apical,basal inferoseptal,basal inferior,mid inferoseptal,midinferior and apical inferior regions  . Cardiac catheterization  01/27/2012    normal coronaries, dilated LV c/w nonischemic cardiomyopathy  . Permanent pacemaker insertion N/A 12/01/2011    Procedure: PERMANENT PACEMAKER INSERTION;  Surgeon: Sanda Klein, MD;  Location: [facility] CATH LAB;  Service: Cardiovascular;  Laterality: N/A;  . Left heart catheterization with coronary angiogram N/A 01/27/2012    Procedure: LEFT HEART CATHETERIZATION WITH CORONARY ANGIOGRAM;  Surgeon: Troy Sine, MD;  Location: [facility] CATH LAB;  Service: Cardiovascular;  Laterality: N/A;     Current Outpatient Prescriptions  Medication Sig Dispense Refill  . aspirin 81 MG tablet Take 81 mg by mouth daily.      Marland Kitchen atorvastatin (LIPITOR) 40 MG tablet Take 40 mg by mouth daily.  3  . buPROPion (WELLBUTRIN XL) 150 MG 24 hr tablet Take 150 mg by mouth daily.    . carvedilol (COREG) 12.5 MG tablet Take 1 tablet (12.5 mg total) by mouth 2 (two) times daily with a meal. 180 tablet 3  . clobetasol ointment (TEMOVATE) 1.09 % Apply 1 application topically daily as needed (itching).     . cyclobenzaprine (FLEXERIL) 5 MG tablet TAKE ONE TABLET BY MOUTH THREE  TIMES DAILY AS NEEDED FOR MUSCLE SPASMS 30 tablet 5  . furosemide (LASIX) 40 MG tablet Take 0.5 tablets (20 mg total) by mouth daily with breakfast. 90 tablet 3  . glipiZIDE (GLUCOTROL XL) 5 MG 24 hr tablet Take 1 tablet (5 mg total) by mouth daily with breakfast. 30 tablet 5  . latanoprost (XALATAN) 0.005 % ophthalmic solution Place 1 drop into the right eye daily.     Marland Kitchen LORazepam (ATIVAN) 0.5 MG tablet Take 0.5 mg by mouth daily as needed for anxiety.    Marland Kitchen losartan (COZAAR) 100 MG tablet Take 1 tablet (100 mg total) by mouth daily. 90 tablet 3  . meloxicam (MOBIC) 15 MG tablet Take 15 mg by mouth daily as needed for pain.   1  . SitaGLIPtin-MetFORMIN HCl [phone] MG TB24 Take 1  tablet daily (Patient taking differently: Take 1 tablet by mouth daily) 30 tablet 4  . spironolactone (ALDACTONE) 25 MG tablet Take 0.5 tablets (12.5 mg total) by mouth daily. 45 tablet 3  . VIAGRA 100 MG tablet Take 100 mg by mouth daily as needed (ED).   11   No current facility-administered medications for this visit.    Allergies:   Sulfa antibiotics   Social History:  The patient  reports that he quit smoking about 30 years ago. His smoking use included Cigarettes. He has a 20 pack-year smoking history. He has never used smokeless tobacco. He reports that he drinks alcohol. He reports that he does not use illicit drugs.   Family History:  The patient's  family history includes Leukemia in his father; Ovarian cancer in his mother.    ROS:  Please see the history of present illness.  All other systems are reviewed and negative.    PHYSICAL EXAM: VS:  BP 110/68 mmHg  Pulse 81  Ht 6\' 3"  (1.905 m)  Wt 200 lb 6.4 oz (90.901 kg)  BMI 25.05 kg/m2 , BMI Body mass index is 25.05 kg/(m^2). GEN: Well nourished, well developed, in no acute distress HEENT: normal Neck: no JVD, carotid bruits, or masses Cardiac: RRR; no murmurs, rubs, or gallops,no edema  Respiratory:  clear to auscultation bilaterally, normal work of breathing GI: soft, nontender, nondistended, + BS MS: no deformity or atrophy Skin: warm and dry, device pocket is well healed Neuro:  Strength and sensation are intact Psych: euthymic mood, full affect  The ekg last visit shows sinus tachycardia 100 bpm with LBBB (QRS >150 msec).  Device interrogation is reviewed today in detail.  See PaceArt for details.   Recent Labs: 08/28/2014: Hemoglobin 15.3; Platelets 163 09/16/2014: ALT 9 09/24/2014: BUN 13; Creatinine 0.9; Potassium 4.2; Sodium 141    Lipid Panel     Component Value Date/Time   CHOL 109 09/16/2014 0803   TRIG 62.0 09/16/2014 0803   HDL 29.60* 09/16/2014 0803   CHOLHDL 4 09/16/2014 0803   VLDL 12.4  09/16/2014 0803   LDLCALC 67 09/16/2014 0803   LDLDIRECT 58.6 09/24/2014 0824     Wt Readings from Last 3 Encounters:  01/06/15 200 lb 6.4 oz (90.901 kg)  12/09/14 192 lb (87.091 kg)  12/02/14 197 lb (89.359 kg)      Other studies Reviewed: Additional studies/ records that were reviewed today include: prior cath 2013, implant note 2013, myoview 2013 myoview/ echo 2016   ASSESSMENT AND PLAN:  1.  Chronic systolic dysfunction with recent worsening of SOB. LBBB He has presumed nonischemic CM.   I have reviewed echo and myoview with  him today. Given stable findings, adequate medical therapy, LBBB, and EF <35%, I have advised device upgrade to a CRT-D device. Risks, benefits, alternatives to BiV ICD upgrade were discussed in detail with the patient today. The patient  understands that the risks include but are not limited to bleeding, infection, pneumothorax, perforation, tamponade, vascular damage, renal failure, MI, stroke, death, inappropriate shocks, and lead dislodgement and wishes to proceed.  We will therefore schedule device implantation at the next available time.  2. 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 Basic Metabolic Panel (BMET)  . CBC with Differential      Signed, [name], MD    [place] [address] [phone] (office) [phone] (fax)

## 2015-01-10 ENCOUNTER — Encounter: Payer: Self-pay | Admitting: Cardiovascular Disease

## 2015-01-13 ENCOUNTER — Ambulatory Visit: Payer: Medicare Other | Admitting: Family Medicine

## 2015-01-15 ENCOUNTER — Other Ambulatory Visit (INDEPENDENT_AMBULATORY_CARE_PROVIDER_SITE_OTHER): Payer: Medicare HMO | Admitting: *Deleted

## 2015-01-15 DIAGNOSIS — I428 Other cardiomyopathies: Secondary | ICD-10-CM

## 2015-01-15 DIAGNOSIS — I447 Left bundle-branch block, unspecified: Secondary | ICD-10-CM

## 2015-01-15 DIAGNOSIS — I429 Cardiomyopathy, unspecified: Secondary | ICD-10-CM

## 2015-01-15 LAB — CBC WITH DIFFERENTIAL/PLATELET
BASOS PCT: 0.6 % (ref 0.0–3.0)
Basophils Absolute: 0 10*3/uL (ref 0.0–0.1)
EOS PCT: 3 % (ref 0.0–5.0)
Eosinophils Absolute: 0.2 10*3/uL (ref 0.0–0.7)
HEMATOCRIT: 41.7 % (ref 39.0–52.0)
Hemoglobin: 14.2 g/dL (ref 13.0–17.0)
LYMPHS ABS: 1.3 10*3/uL (ref 0.7–4.0)
Lymphocytes Relative: 20.8 % (ref 12.0–46.0)
MCHC: 33.9 g/dL (ref 30.0–36.0)
MCV: 95.9 fl (ref 78.0–100.0)
MONOS PCT: 4.9 % (ref 3.0–12.0)
Monocytes Absolute: 0.3 10*3/uL (ref 0.1–1.0)
NEUTROS ABS: 4.3 10*3/uL (ref 1.4–7.7)
NEUTROS PCT: 70.7 % (ref 43.0–77.0)
Platelets: 147 10*3/uL — ABNORMAL LOW (ref 150.0–400.0)
RBC: 4.35 Mil/uL (ref 4.22–5.81)
RDW: 13.8 % (ref 11.5–15.5)
WBC: 6.1 10*3/uL (ref 4.0–10.5)

## 2015-01-15 LAB — BASIC METABOLIC PANEL
BUN: 14 mg/dL (ref 6–23)
CO2: 29 mEq/L (ref 19–32)
Calcium: 9.2 mg/dL (ref 8.4–10.5)
Chloride: 104 mEq/L (ref 96–112)
Creatinine, Ser: 0.91 mg/dL (ref 0.40–1.50)
GFR: 87.76 mL/min (ref >60.00)
Glucose, Bld: 268 mg/dL — ABNORMAL HIGH (ref 70–99)
Potassium: 4.5 mEq/L (ref 3.5–5.1)
SODIUM: 139 meq/L (ref 135–145)

## 2015-01-20 ENCOUNTER — Encounter (HOSPITAL_COMMUNITY): Payer: Self-pay | Admitting: Pharmacy Technician

## 2015-01-21 ENCOUNTER — Telehealth: Payer: Self-pay | Admitting: Internal Medicine

## 2015-01-21 DIAGNOSIS — Z87891 Personal history of nicotine dependence: Secondary | ICD-10-CM | POA: Diagnosis not present

## 2015-01-21 DIAGNOSIS — I495 Sick sinus syndrome: Secondary | ICD-10-CM | POA: Diagnosis not present

## 2015-01-21 DIAGNOSIS — I447 Left bundle-branch block, unspecified: Secondary | ICD-10-CM | POA: Diagnosis not present

## 2015-01-21 DIAGNOSIS — Z79899 Other long term (current) drug therapy: Secondary | ICD-10-CM | POA: Diagnosis not present

## 2015-01-21 DIAGNOSIS — E119 Type 2 diabetes mellitus without complications: Secondary | ICD-10-CM | POA: Diagnosis not present

## 2015-01-21 DIAGNOSIS — I5022 Chronic systolic (congestive) heart failure: Secondary | ICD-10-CM | POA: Diagnosis not present

## 2015-01-21 DIAGNOSIS — Z7982 Long term (current) use of aspirin: Secondary | ICD-10-CM | POA: Diagnosis not present

## 2015-01-21 DIAGNOSIS — I429 Cardiomyopathy, unspecified: Secondary | ICD-10-CM | POA: Diagnosis present

## 2015-01-21 MED ORDER — SODIUM CHLORIDE 0.9 % IV SOLN: 250.0000 mL | INTRAVENOUS | Status: DC

## 2015-01-21 MED ORDER — SODIUM CHLORIDE 0.9 % IV SOLN
INTRAVENOUS | Status: DC
  Administered 2015-01-22: 13:00:00 via INTRAVENOUS

## 2015-01-21 MED ORDER — CEFAZOLIN SODIUM-DEXTROSE 2-3 GM-% IV SOLR: 2.0000 g | INTRAVENOUS | Status: DC

## 2015-01-21 MED ORDER — SODIUM CHLORIDE 0.9 % IJ SOLN: 3.0000 mL | INTRAMUSCULAR | Status: DC | PRN

## 2015-01-21 MED ORDER — SODIUM CHLORIDE 0.9 % IJ SOLN
3.0000 mL | Freq: Two times a day (BID) | INTRAMUSCULAR | Status: DC
Start: 2015-01-21 — End: 2015-01-22

## 2015-01-21 MED ORDER — SODIUM CHLORIDE 0.9 % IR SOLN
80.0000 mg | Status: DC
  Filled 2015-01-21: qty 2

## 2015-01-21 NOTE — Telephone Encounter (Signed)
Spoke with patient and let him know if he feels he has to have something due to being weak from his DM then he can have lite breakfast but nothing after 6am

## 2015-01-21 NOTE — Telephone Encounter (Signed)
New message      Pt is having an ICD put in tomorrow.  He is a diabetic and cannot go all night and all morning tomorrow without eating.  Procedure is scheduled for the afternoon.  Please call

## 2015-01-22 ENCOUNTER — Encounter (HOSPITAL_COMMUNITY): Payer: Self-pay | Admitting: General Practice

## 2015-01-22 ENCOUNTER — Ambulatory Visit (HOSPITAL_COMMUNITY)
Admission: RE | Admit: 2015-01-22 | Discharge: 2015-01-23 | Disposition: A | Discharge status: Home / Self Care | Payer: Medicare HMO | Source: Ambulatory Visit | Attending: Internal Medicine | Admitting: Internal Medicine

## 2015-01-22 ENCOUNTER — Encounter (HOSPITAL_COMMUNITY)
Admission: RE | Disposition: A | Discharge status: Home / Self Care | Payer: Self-pay | Source: Ambulatory Visit | Attending: Internal Medicine

## 2015-01-22 DIAGNOSIS — I447 Left bundle-branch block, unspecified: Secondary | ICD-10-CM | POA: Diagnosis not present

## 2015-01-22 DIAGNOSIS — I429 Cardiomyopathy, unspecified: Secondary | ICD-10-CM

## 2015-01-22 DIAGNOSIS — E119 Type 2 diabetes mellitus without complications: Secondary | ICD-10-CM | POA: Insufficient documentation

## 2015-01-22 DIAGNOSIS — I495 Sick sinus syndrome: Secondary | ICD-10-CM | POA: Diagnosis not present

## 2015-01-22 DIAGNOSIS — Z87891 Personal history of nicotine dependence: Secondary | ICD-10-CM | POA: Insufficient documentation

## 2015-01-22 DIAGNOSIS — Z7982 Long term (current) use of aspirin: Secondary | ICD-10-CM | POA: Insufficient documentation

## 2015-01-22 DIAGNOSIS — I428 Other cardiomyopathies: Secondary | ICD-10-CM

## 2015-01-22 DIAGNOSIS — Z950 Presence of cardiac pacemaker: Secondary | ICD-10-CM | POA: Diagnosis present

## 2015-01-22 DIAGNOSIS — Z79899 Other long term (current) drug therapy: Secondary | ICD-10-CM | POA: Insufficient documentation

## 2015-01-22 DIAGNOSIS — I5022 Chronic systolic (congestive) heart failure: Secondary | ICD-10-CM | POA: Insufficient documentation

## 2015-01-22 DIAGNOSIS — I519 Heart disease, unspecified: Secondary | ICD-10-CM | POA: Diagnosis present

## 2015-01-22 DIAGNOSIS — Z959 Presence of cardiac and vascular implant and graft, unspecified: Secondary | ICD-10-CM

## 2015-01-22 HISTORY — DX: Type 2 diabetes mellitus without complications: E11.9

## 2015-01-22 HISTORY — PX: BI-VENTRICULAR IMPLANTABLE CARDIOVERTER DEFIBRILLATOR UPGRADE: SHX5461

## 2015-01-22 HISTORY — DX: Malignant neoplasm of prostate: C61

## 2015-01-22 HISTORY — DX: Contact with and (suspected) exposure to other hazardous, chiefly nonmedicinal, chemicals: Z77.098

## 2015-01-22 HISTORY — DX: Pure hypercholesterolemia, unspecified: E78.00

## 2015-01-22 HISTORY — DX: Essential (primary) hypertension: I10

## 2015-01-22 LAB — SURGICAL PCR SCREEN
MRSA, PCR: NEGATIVE
Staphylococcus aureus: NEGATIVE

## 2015-01-22 LAB — GLUCOSE, CAPILLARY
Glucose-Capillary: 181 mg/dL — ABNORMAL HIGH (ref 70–99)
Glucose-Capillary: 112 mg/dL — ABNORMAL HIGH (ref 70–99)
Glucose-Capillary: 258 mg/dL — ABNORMAL HIGH (ref 70–99)

## 2015-01-22 SURGERY — BI-VENTRICULAR IMPLANTABLE CARDIOVERTER DEFIBRILLATOR UPGRADE
Anesthesia: LOCAL

## 2015-01-22 MED ORDER — LOSARTAN POTASSIUM 50 MG PO TABS
50.0000 mg | ORAL_TABLET | Freq: Every day | ORAL | Status: DC
  Filled 2015-01-22: qty 1

## 2015-01-22 MED ORDER — FUROSEMIDE 20 MG PO TABS
20.0000 mg | ORAL_TABLET | Freq: Every day | ORAL | Status: DC
  Filled 2015-01-22 (×2): qty 1

## 2015-01-22 MED ORDER — LATANOPROST 0.005 % OP SOLN
1.0000 [drp] | Freq: Every day | OPHTHALMIC | Status: DC
  Filled 2015-01-22: qty 2.5

## 2015-01-22 MED ORDER — LIDOCAINE HCL (PF) 1 % IJ SOLN
INTRAMUSCULAR | Status: AC
  Filled 2015-01-22: qty 30

## 2015-01-22 MED ORDER — MUPIROCIN 2 % EX OINT
1.0000 "application " | TOPICAL_OINTMENT | Freq: Once | CUTANEOUS | Status: AC
  Administered 2015-01-22: 1 via TOPICAL

## 2015-01-22 MED ORDER — ACETAMINOPHEN 325 MG PO TABS
325.0000 mg | ORAL_TABLET | ORAL | Status: DC | PRN
  Administered 2015-01-22: 650 mg via ORAL
  Filled 2015-01-22: qty 2

## 2015-01-22 MED ORDER — CYCLOBENZAPRINE HCL 10 MG PO TABS: 5.0000 mg | ORAL_TABLET | Freq: Three times a day (TID) | ORAL | Status: DC | PRN

## 2015-01-22 MED ORDER — BUPROPION HCL ER (XL) 150 MG PO TB24
150.0000 mg | ORAL_TABLET | Freq: Every day | ORAL | Status: DC
  Filled 2015-01-22: qty 1

## 2015-01-22 MED ORDER — SODIUM CHLORIDE 0.9 % IJ SOLN: 3.0000 mL | INTRAMUSCULAR | Status: DC | PRN

## 2015-01-22 MED ORDER — SITAGLIPTIN-METFORMIN HCL ER 100-1000 MG PO TB24
1.0000 | ORAL_TABLET | Freq: Every day | ORAL | Status: DC
Start: 2015-01-23 — End: 2015-01-22

## 2015-01-22 MED ORDER — ONDANSETRON HCL 4 MG/2ML IJ SOLN: 4.0000 mg | Freq: Four times a day (QID) | INTRAMUSCULAR | Status: DC | PRN

## 2015-01-22 MED ORDER — SPIRONOLACTONE 12.5 MG HALF TABLET
12.5000 mg | ORAL_TABLET | Freq: Every day | ORAL | Status: DC
  Filled 2015-01-22: qty 1

## 2015-01-22 MED ORDER — CHLORHEXIDINE GLUCONATE 4 % EX LIQD
60.0000 mL | Freq: Once | CUTANEOUS | Status: DC
Start: 2015-01-22 — End: 2015-01-22

## 2015-01-22 MED ORDER — LORAZEPAM 0.5 MG PO TABS
0.5000 mg | ORAL_TABLET | Freq: Every day | ORAL | Status: DC | PRN
  Administered 2015-01-22: 0.5 mg via ORAL
  Filled 2015-01-22: qty 1

## 2015-01-22 MED ORDER — FENTANYL CITRATE 0.05 MG/ML IJ SOLN
INTRAMUSCULAR | Status: AC
  Filled 2015-01-22: qty 2

## 2015-01-22 MED ORDER — CEFAZOLIN SODIUM 1-5 GM-% IV SOLN
1.0000 g | Freq: Four times a day (QID) | INTRAVENOUS | Status: DC
  Administered 2015-01-22 – 2015-01-23 (×2): 1 g via INTRAVENOUS
  Filled 2015-01-22 (×4): qty 50

## 2015-01-22 MED ORDER — HEPARIN (PORCINE) IN NACL 2-0.9 UNIT/ML-% IJ SOLN
INTRAMUSCULAR | Status: AC
  Filled 2015-01-22: qty 1000

## 2015-01-22 MED ORDER — CARVEDILOL 12.5 MG PO TABS: 12.5000 mg | ORAL_TABLET | Freq: Two times a day (BID) | ORAL | Status: DC

## 2015-01-22 MED ORDER — SODIUM CHLORIDE 0.9 % IJ SOLN
3.0000 mL | Freq: Two times a day (BID) | INTRAMUSCULAR | Status: DC
  Administered 2015-01-23: 3 mL via INTRAVENOUS

## 2015-01-22 MED ORDER — HYDROCODONE-ACETAMINOPHEN 5-325 MG PO TABS: 1.0000 | ORAL_TABLET | ORAL | Status: DC | PRN

## 2015-01-22 MED ORDER — MUPIROCIN 2 % EX OINT
TOPICAL_OINTMENT | CUTANEOUS | Status: AC
  Filled 2015-01-22: qty 22

## 2015-01-22 MED ORDER — MIDAZOLAM HCL 5 MG/5ML IJ SOLN
INTRAMUSCULAR | Status: AC
  Filled 2015-01-22: qty 5

## 2015-01-22 MED ORDER — GLIPIZIDE ER 5 MG PO TB24
5.0000 mg | ORAL_TABLET | Freq: Every day | ORAL | Status: DC
  Filled 2015-01-22 (×2): qty 1

## 2015-01-22 MED ORDER — SODIUM CHLORIDE 0.9 % IV SOLN: 250.0000 mL | INTRAVENOUS | Status: DC | PRN

## 2015-01-22 MED ORDER — INSULIN ASPART 100 UNIT/ML ~~LOC~~ SOLN: 0.0000 [IU] | Freq: Three times a day (TID) | SUBCUTANEOUS | Status: DC

## 2015-01-22 NOTE — H&P (View-Only) (Signed)
Electrophysiology Office Note   Date:  01/06/2015   ID:  Kevin [name], Kevin [name] 27-Nov-1945, MRN [id number]  PCP:  [name], MD  Cardiologist:  Dr Sallyanne Kuster   Chief Complaint  Patient presents with  . Dizziness    CP, SOB & NICM     History of Present Illness: Kevin [name] is a 69 y.o. male who presents today for electrophysiology follow-up.   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  He underwent cath in 2013 which did not reveals obstructive CAD.  His EF was previously mildly depressed (40%).  He has had gradual worsening of symptoms of shortness of breath and fatigue.  He remains active.  He reports chest tightness/ pressure with moderate activity.  He also feels that his SOB is abruptly worse.  He finds that his SOB is most noticeable during sexual intercourse.   Last visit, I ordered echo and myoview.  The echo revealed EF 25-30%, myoview revealed large scar with peri-infarct ischemia unchanged from study 2014.Marland Kitchen   Past Medical History  Diagnosis Date  . Diabetes mellitus   . Renal calculi     remote  . UTI (urinary tract infection)     15-20 years ago  . Peri-rectal abscess     5 years ago  . Sinus bradycardia   . Anemia   . Pacemaker   . Blood transfusion     "in [place]"  . Nonischemic cardiomyopathy   . History of colon polyps   . LBBB (left bundle branch block)   . 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 Permanent pacemaker insertion  12/01/11    Medtronic implanted by Dr Sallyanne Kuster  . Inguinal hernia repair  1980    left  . Tonsillectomy    . Shrapnel surgery      "in [place]"  . Examination under anesthesia  04/06/2012    Procedure: EXAM UNDER ANESTHESIA;  Surgeon: Marcello Moores A. Cornett, MD;  Location: [place];  Service: General;  Laterality: N/A;  . Nm myocar perf wall motion  01/21/2012    mod-severe perfusion defect due to  infarct/scar w/mild perinfarct ischemia in the apical,basal inferoseptal,basal inferior,mid inferoseptal,midinferior and apical inferior regions  . Cardiac catheterization  01/27/2012    normal coronaries, dilated LV c/w nonischemic cardiomyopathy  . Permanent pacemaker insertion N/A 12/01/2011    Procedure: PERMANENT PACEMAKER INSERTION;  Surgeon: Sanda Klein, MD;  Location: [place] CATH LAB;  Service: Cardiovascular;  Laterality: N/A;  . Left heart catheterization with coronary angiogram N/A 01/27/2012    Procedure: LEFT HEART CATHETERIZATION WITH CORONARY ANGIOGRAM;  Surgeon: Troy Sine, MD;  Location: [facility] CATH LAB;  Service: Cardiovascular;  Laterality: N/A;     Current Outpatient Prescriptions  Medication Sig Dispense Refill  . aspirin 81 MG tablet Take 81 mg by mouth daily.      Marland Kitchen atorvastatin (LIPITOR) 40 MG tablet Take 40 mg by mouth daily.  3  . buPROPion (WELLBUTRIN XL) 150 MG 24 hr tablet Take 150 mg by mouth daily.    . carvedilol (COREG) 12.5 MG tablet Take 1 tablet (12.5 mg total) by mouth 2 (two) times daily with a meal. 180 tablet 3  . clobetasol ointment (TEMOVATE) 7.10 % Apply 1 application topically daily as needed (itching).     . cyclobenzaprine (FLEXERIL) 5 MG tablet TAKE ONE TABLET BY MOUTH THREE  TIMES DAILY AS NEEDED FOR MUSCLE SPASMS 30 tablet 5  . furosemide (LASIX) 40 MG tablet Take 0.5 tablets (20 mg total) by mouth daily with breakfast. 90 tablet 3  . glipiZIDE (GLUCOTROL XL) 5 MG 24 hr tablet Take 1 tablet (5 mg total) by mouth daily with breakfast. 30 tablet 5  . latanoprost (XALATAN) 0.005 % ophthalmic solution Place 1 drop into the right eye daily.     Marland Kitchen LORazepam (ATIVAN) 0.5 MG tablet Take 0.5 mg by mouth daily as needed for anxiety.    Marland Kitchen losartan (COZAAR) 100 MG tablet Take 1 tablet (100 mg total) by mouth daily. 90 tablet 3  . meloxicam (MOBIC) 15 MG tablet Take 15 mg by mouth daily as needed for pain.   1  . SitaGLIPtin-MetFORMIN HCl [phone] MG TB24 Take 1  tablet daily (Patient taking differently: Take 1 tablet by mouth daily) 30 tablet 4  . spironolactone (ALDACTONE) 25 MG tablet Take 0.5 tablets (12.5 mg total) by mouth daily. 45 tablet 3  . VIAGRA 100 MG tablet Take 100 mg by mouth daily as needed (ED).   11   No current facility-administered medications for this visit.    Allergies:   Sulfa antibiotics   Social History:  The patient  reports that he quit smoking about 30 years ago. His smoking use included Cigarettes. He has a 20 pack-year smoking history. He has never used smokeless tobacco. He reports that he drinks alcohol. He reports that he does not use illicit drugs.   Family History:  The patient's  family history includes Leukemia in his father; Ovarian cancer in his mother.    ROS:  Please see the history of present illness.  All other systems are reviewed and negative.    PHYSICAL EXAM: VS:  BP 110/68 mmHg  Pulse 81  Ht 6\' 3"  (1.905 m)  Wt 200 lb 6.4 oz (90.901 kg)  BMI 25.05 kg/m2 , BMI Body mass index is 25.05 kg/(m^2). GEN: Well nourished, well developed, in no acute distress HEENT: normal Neck: no JVD, carotid bruits, or masses Cardiac: RRR; no murmurs, rubs, or gallops,no edema  Respiratory:  clear to auscultation bilaterally, normal work of breathing GI: soft, nontender, nondistended, + BS MS: no deformity or atrophy Skin: warm and dry, device pocket is well healed Neuro:  Strength and sensation are intact Psych: euthymic mood, full affect  The ekg last visit shows sinus tachycardia 100 bpm with LBBB (QRS >150 msec).  Device interrogation is reviewed today in detail.  See PaceArt for details.   Recent Labs: 08/28/2014: Hemoglobin 15.3; Platelets 163 09/16/2014: ALT 9 09/24/2014: BUN 13; Creatinine 0.9; Potassium 4.2; Sodium 141    Lipid Panel     Component Value Date/Time   CHOL 109 09/16/2014 0803   TRIG 62.0 09/16/2014 0803   HDL 29.60* 09/16/2014 0803   CHOLHDL 4 09/16/2014 0803   VLDL 12.4  09/16/2014 0803   LDLCALC 67 09/16/2014 0803   LDLDIRECT 58.6 09/24/2014 0824     Wt Readings from Last 3 Encounters:  01/06/15 200 lb 6.4 oz (90.901 kg)  12/09/14 192 lb (87.091 kg)  12/02/14 197 lb (89.359 kg)      Other studies Reviewed: Additional studies/ records that were reviewed today include: prior cath 2013, implant note 2013, myoview 2013 myoview/ echo 2016   ASSESSMENT AND PLAN:  1.  Chronic systolic dysfunction with recent worsening of SOB. LBBB He has presumed nonischemic CM.   I have reviewed echo and myoview with  him today. Given stable findings, adequate medical therapy, LBBB, and EF <35%, I have advised device upgrade to a CRT-D device. Risks, benefits, alternatives to BiV ICD upgrade were discussed in detail with the patient today. The patient  understands that the risks include but are not limited to bleeding, infection, pneumothorax, perforation, tamponade, vascular damage, renal failure, MI, stroke, death, inappropriate shocks, and lead dislodgement and wishes to proceed.  We will therefore schedule device implantation at the next available time.  2. 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 Basic Metabolic Panel (BMET)  . CBC with Differential      Signed, [name], MD    [address] [place] [address] [phone] (office) [phone] (fax)

## 2015-01-22 NOTE — Op Note (Signed)
SURGEON: [name], MD    PREPROCEDURE DIAGNOSES:  1. Nonischemic cardiomyopathy.  2. New York Heart Association class III, heart failure chronically.  3. Sick sinus syndrome 4. LBBB   POSTPROCEDURE DIAGNOSES:  1. Nonischemic cardiomyopathy.  2. New York Heart Association class III, heart failure chronically.  3. Sick sinus syndrome 4. LBBB  PROCEDURES:  1. Left upper extremity venography 2. Upgrade to BiV ICD (LV lead placement and RV ICD lead placement) 3. Skin pocket revision   INTRODUCTION: Kevin [name] is a 69 y.o. male with a nonischemic CM (EF 25%), NYHA Class III CHF, sick sinus syndrome, and LBBB s/p prior PPM implantation. He has developed LBBB with worsening symptoms of CHF despite medicine optimization. The patient therefore presents today for upgrade to a BiV ICD.    DESCRIPTION OF PROCEDURE: Informed written consent was obtained and the patient was brought to the electrophysiology lab in the fasting state.  The patient was adequately sedated with intravenous Versed, and fentanyl as outlined in the nursing report. The patient's left chest was prepped and draped in the usual sterile fashion by the EP lab staff. The skin overlying the left deltopectoral region was infiltrated with lidocaine for local analgesia. A 5-cm incision was made over the existing device pocket. Electrocautery was used to assure hemostasis.  The device was exposed and removed from the pocket. The device was disconnected from the leads. The leads were examined thoroughly and their integrity confirmed to be intact.  The right atrial lead was confirmed to be a Medtronic model [id number] (serial # [id number]) lead implanted 12/01/2011. The right ventricular lead was confirmed to be a Medtronic model [id number] (serial number [id number]) right ventricular lead also implanted 12/01/2011. Atrial lead P-waves measured 4.3 mV with an impedance of 418 ohms and a threshold  of 0.75 volts at 0.5 milliseconds. The RV lead was capped and returned to the pocket but was a normally functioning lead.  Left Upper extremity Venography: A venogram of the left upper extremity was performed which revealed a moderate sized left axillary vein which emptied into a moderate sized left subclavian vein. There was no venous stenosis.  RV Lead Placement: A Medtronic model [id number] ([id number]) RV defibrillator lead was advanced through the right axillary vein into the RV apex location.  In this location, R waves measured 61mV with an impedance of 1106 Ohms and a threshold of 0.5V @0 .5 msec.  The lead was secured to the pectoralis facia with a #2 silk over the suture sleeve.  LV Lead Placement: A Medtronic MB-2 guide was advanced through the left axillary vein into the low lateral right atrium. A Bard curved Damato catheter was introduced through the MB-2 guide and used to cannulate the coronary sinus. A nonselective coronary sinus venogram was performed by hand injection of nonionic contrast. This demonstrated a moderate sized lateral coronary sinus branch. No other branches were identified with this nonselective injection. A Whisper CSJ wire was introduced through the guide and advanced into the lateral branch. A Medtronic model [id number] (serial number [id number]) lead was advanced through the MB-2 into the lateral branch. This was approximately two-thirds from the base to the apex in a very lateral position.  The 4th electrode was approximately 1/3 from base to apex in a lateral position. In this location, the left ventricular lead impedance was 1000 ohms with a threshold of 0.5 volt at 0.5 milliseconds in the LV4- coil configuration with no diaphragmatic stimulation observed when pacing at 10 volts output. The MB-2  guide was therefore removed.   The LV lead was then secured to the pectoralis fascia using #2 silk suture over the suture sleeve. The pocket then irrigated with  copious gentamicin solution. All three leads were then connected to a Medtronic [employer] XT CRT-D model [id number] (serial Number [id number]) biventricular ICD. The defibrillator was placed into the pocket. The pocket was then closed in 2 layers with 2.0 Vicryl suture for the subcutaneous and subcuticular layers. EBL<55ml. Steri-Strips and a sterile dressing were then applied. There were no early apparent complications. DFT testing was not performed today.   CONCLUSIONS:  1. Nonischemic cardiomyopathy with Left bundle-branch block and chronic New York Heart Association class III heart failure.  2. Successful biventricular ICD upgrade with new RV defib lead and LV lead placement. The prior RV pacing lead was capped and returned to the pocket.  This was a functional lead. 3. No early apparent complications.   [name] MD 01/22/2015 4:20 PM

## 2015-01-22 NOTE — Interval H&P Note (Signed)
History and Physical Interval Note:  01/22/2015 12:39 PM  Kevin [name]  has presented today for surgery, with the diagnosis of Heart Failure  The various methods of treatment have been discussed with the patient and family. After consideration of risks, benefits and other options for treatment, the patient has consented to  Procedure(s): BI-VENTRICULAR IMPLANTABLE CARDIOVERTER DEFIBRILLATOR UPGRADE (N/A) as a surgical intervention .  The patient's history has been reviewed, patient examined, no change in status, stable for surgery.  I have reviewed the patient's chart and labs.  Questions were answered to the patient's satisfaction.     ICD Criteria  Current LVEF:25% ;Obtained > or = 1 month ago and < or = 3 months ago.  NYHA Functional Classification: Class III  Heart Failure History:  Yes, Duration of heart failure since onset is > 9 months  Non-Ischemic Dilated Cardiomyopathy History:  Yes, timeframe is > 9 months  Atrial Fibrillation/Atrial Flutter:  No.  Ventricular Tachycardia History:  No.  Cardiac Arrest History:  No  History of Syndromes with Risk of Sudden Death:  No.  Previous ICD:  No.  Electrophysiology Study: No.  Prior MI: No.  PPM: Yes, PPM type is: Dual Chamber.  OSA:  No  Patient Life Expectancy of >=1 year: Yes.  Anticoagulation Therapy:  Patient is NOT on anticoagulation therapy.   Beta Blocker Therapy:  Yes.   Ace Inhibitor/ARB Therapy:  Yes.   [name]

## 2015-01-22 NOTE — Progress Notes (Signed)
Notified MD that patient is having spikes in his PVC and possible morphology change in lead two. MD aware these strips are saved in [facility] to review. Patient asymptomatic.

## 2015-01-23 ENCOUNTER — Telehealth: Payer: Self-pay | Admitting: Internal Medicine

## 2015-01-23 ENCOUNTER — Ambulatory Visit (HOSPITAL_COMMUNITY): Payer: Medicare HMO

## 2015-01-23 DIAGNOSIS — I495 Sick sinus syndrome: Secondary | ICD-10-CM | POA: Diagnosis not present

## 2015-01-23 DIAGNOSIS — I5022 Chronic systolic (congestive) heart failure: Secondary | ICD-10-CM | POA: Diagnosis not present

## 2015-01-23 DIAGNOSIS — I447 Left bundle-branch block, unspecified: Secondary | ICD-10-CM | POA: Diagnosis not present

## 2015-01-23 DIAGNOSIS — I429 Cardiomyopathy, unspecified: Secondary | ICD-10-CM | POA: Diagnosis not present

## 2015-01-23 LAB — BASIC METABOLIC PANEL
Anion gap: 10 (ref 5–15)
BUN: 12 mg/dL (ref 6–23)
CHLORIDE: 105 mmol/L (ref 96–112)
CO2: 23 mmol/L (ref 19–32)
Calcium: 8.6 mg/dL (ref 8.4–10.5)
Creatinine, Ser: 0.81 mg/dL (ref 0.50–1.35)
GFR, EST NON AFRICAN AMERICAN: 89 mL/min — AB (ref >90)
Glucose, Bld: 142 mg/dL — ABNORMAL HIGH (ref 70–99)
Potassium: 4 mmol/L (ref 3.5–5.1)
Sodium: 138 mmol/L (ref 135–145)

## 2015-01-23 LAB — GLUCOSE, CAPILLARY: Glucose-Capillary: 175 mg/dL — ABNORMAL HIGH (ref 70–99)

## 2015-01-23 NOTE — Telephone Encounter (Signed)
New message         Pt wife wants to know if pt needs to stop any medication due to having dye in his system

## 2015-01-23 NOTE — Discharge Summary (Signed)
Electrophysiology Discharge Summary  Patient ID: JADD GASIOR MRN: [id number] DOB/AGE: [birth year] 69 y.o.   Electrophysiologist: Dr. Rayann Heman  Admit date: 01/22/2015 Discharge date: 01/23/2015  Admission Diagnoses: Nonischemic Cardiomyopathy w/ LBBB and SSS  Discharge Diagnoses:  Active Problems:   Pacemaker   Nonischemic cardiomyopathy   Left bundle branch block   Sick sinus syndrome   Chronic systolic dysfunction of left ventricle   Discharged Condition: stable  Hospital Course: Kevin [name] is a 69 y.o. male with a nonischemic CM (EF 25%), NYHA Class III CHF, sick sinus syndrome, and LBBB s/p prior PPM implantation. He has developed LBBB with worsening symptoms of CHF despite medicine optimization. On 01/22/15, he presented to [facility] to undergo elective upgrade to a BiV ICD. The procedure was performed by Dr. Rayann Heman. He underwent successful insertion of a Medtronic BiV ICD. He tolerated the procedure well. He was monitored overnight. There were no complications. Post-operative CXR showed stable leads with no pneumothorax. Device interrogation was normal. He was last seen and examined by Dr. Rayann Heman who determined he was stable for discharge.  Wound check f/u has been arranged. He will see Chanetta Marshall, NP, on 03/05/15 and Dr. Rayann Heman in 3 months on 04/24/15.   Consults: None  Significant Diagnostic Studies:   2D echo 12/02/14 Study Conclusions  - Left ventricle: The cavity size was severely dilated. Systolic function was severely reduced. The estimated ejection fraction was in the range of 25% to 30%. Diffuse hypokinesis. Doppler parameters are consistent with abnormal left ventricular relaxation (grade 1 diastolic dysfunction). Doppler parameters are consistent with elevated ventricular end-diastolic filling pressure. - Ventricular septum: Septal motion showed paradox. - Aortic valve: Trileaflet; normal thickness leaflets. There was moderate  regurgitation. - Aortic root: The aortic root was normal in size. - Mitral valve: There was mild to moderate regurgitation. - Left atrium: The atrium was mildly dilated. - Right ventricle: Pacer wire or catheter noted in right ventricle. Systolic function was mildly reduced. - Right atrium: The atrium was normal in size. Pacer wire or catheter noted in right atrium. - Tricuspid valve: There was trivial regurgitation. - Pulmonary arteries: Systolic pressure was within the normal range. - Pericardium, extracardiac: There was no pericardial effusion.  Impressions:  - Compared to the study from 12/04/2013 the left ventricle is now sverely dilated (previously mildly) and LVEF has declined from 40 to 25-30%. There are elevated filling pressures. RV systolic function is mildly decreased.   Treatments: Medtronic BiV ICD (see OP note for full details)   Discharge Exam: Blood pressure 122/46, pulse 76, temperature 98.5 F (36.9 C), temperature source Oral, resp. rate 18, height 6\' 3"  (1.905 m), weight 203 lb 7.8 oz (92.3 kg), SpO2 97 %.  Post-Op CXR 01/23/15 FINDINGS: The transvenous leads appear intact. There is no pneumothorax. There is moderate interstitial coarsening which appears chronic. Hilar, mediastinal and cardiac contours appear unremarkable and unchanged. There are no effusions.  IMPRESSION: Intact transvenous leads. No pneumothorax.   Disposition: 01-Home or Self Care      Discharge Instructions    Diet - low sodium heart healthy    Complete by:  As directed      Increase activity slowly    Complete by:  As directed             Medication List    TAKE these medications        ACCU-CHEK COMPACT PLUS test strip  Generic drug:  glucose blood  1 each by Other route as needed.  aspirin 81 MG tablet  Take 81 mg by mouth daily.     atorvastatin 80 MG tablet  Commonly known as:  LIPITOR  Take 40 mg by mouth daily at 6 PM.     buPROPion 150  MG 24 hr tablet  Commonly known as:  WELLBUTRIN XL  Take 150 mg by mouth daily.     carvedilol 12.5 MG tablet  Commonly known as:  COREG  Take 1 tablet (12.5 mg total) by mouth 2 (two) times daily with a meal.     clobetasol ointment 0.05 %  Commonly known as:  TEMOVATE  Apply 1 application topically daily as needed (itching).     cyclobenzaprine 5 MG tablet  Commonly known as:  FLEXERIL  Take 5 mg by mouth 3 (three) times daily as needed for muscle spasms.     furosemide 40 MG tablet  Commonly known as:  LASIX  Take 0.5 tablets (20 mg total) by mouth daily with breakfast.     glipiZIDE 5 MG 24 hr tablet  Commonly known as:  GLUCOTROL XL  Take 1 tablet (5 mg total) by mouth daily with breakfast.     latanoprost 0.005 % ophthalmic solution  Commonly known as:  XALATAN  Place 1 drop into the right eye daily.     LORazepam 0.5 MG tablet  Commonly known as:  ATIVAN  Take 0.5 mg by mouth daily as needed for anxiety.     losartan 100 MG tablet  Commonly known as:  COZAAR  Take 50 mg by mouth daily.     meloxicam 15 MG tablet  Commonly known as:  MOBIC  Take 15 mg by mouth daily as needed for pain.     SitaGLIPtin-MetFORMIN HCl [id number] MG Tb24  Take 1 tablet by mouth daily.     spironolactone 25 MG tablet  Commonly known as:  ALDACTONE  Take 0.5 tablets (12.5 mg total) by mouth daily.     VIAGRA 100 MG tablet  Generic drug:  sildenafil  Take 100 mg by mouth daily as needed (ED).       Follow-up Information    Follow up with [facility] On 01/30/2015.   Specialty:  Cardiology   Why:  3:15 pm, For suture removal, For wound re-check   Contact information:   [address] [place] [phone]      Follow up with Patsey Berthold, NP On 03/05/2015.   Specialty:  Nurse Practitioner   Why:  1:45 pm    Contact information:   [place] [address] [phone]       Follow up with [name], MD On 04/24/2015.   Specialty:  Cardiology   Why:  10:45 am    Contact information:   [address] [place] [address] [phone]       TIME SPENT ON DISCHARGE, INCLUDING PHYSICIAN TIME: >30 MINUTES  Signed: Lyda Jester 01/23/2015, 8:37 AM  [name] MD

## 2015-01-23 NOTE — Progress Notes (Signed)
   SUBJECTIVE: The patient is doing well today.  At this time, he denies chest pain, shortness of breath, or any new concerns.  Marland Kitchen buPROPion  150 mg Oral Daily  . carvedilol  12.5 mg Oral BID WC  .  ceFAZolin (ANCEF) IV  1 g Intravenous Q6H  . furosemide  20 mg Oral Q breakfast  . glipiZIDE  5 mg Oral Q breakfast  . insulin aspart  0-9 Units Subcutaneous TID WC  . latanoprost  1 drop Right Eye QHS  . losartan  50 mg Oral Daily  . sodium chloride  3 mL Intravenous Q12H  . spironolactone  12.5 mg Oral Daily      OBJECTIVE: Physical Exam: Filed Vitals:   01/22/15 2351 01/23/15 0052 01/23/15 0552 01/23/15 0600  BP: 104/54  137/101 132/73  Pulse: 83  86   Temp: 98.4 F (36.9 C)  98.1 F (36.7 C)   TempSrc: Oral  Oral   Resp: 17  20   Height:      Weight:  203 lb 7.8 oz (92.3 kg)    SpO2: 93%  93%     Intake/Output Summary (Last 24 hours) at 01/23/15 0631 Last data filed at 01/23/15 0300  Gross per 24 hour  Intake      0 ml  Output      0 ml  Net      0 ml    Telemetry reveals sinus rhythm  GEN- The patient is well appearing, alert and oriented x 3 today.   Head- normocephalic, atraumatic Eyes-  Sclera clear, conjunctiva pink Ears- hearing intact Oropharynx- clear Neck- supple, no JVP Lymph- no cervical lymphadenopathy Lungs- Clear to ausculation bilaterally, normal work of breathing Heart- Regular rate and rhythm, no murmurs, rubs or gallops, PMI not laterally displaced GI- soft, NT, ND, + BS Extremities- no clubbing, cyanosis, or edema Skin- device pocket is without hematoma/ bleeding LABS: Basic Metabolic Panel:  Recent Labs  01/23/15 0411  NA 138  K 4.0  CL 105  CO2 23  GLUCOSE 142*  BUN 12  CREATININE 0.81  CALCIUM 8.6  No results found.  ASSESSMENT AND PLAN:  Active Problems:   Pacemaker   Nonischemic cardiomyopathy   Left bundle branch block   Sick sinus syndrome   Chronic systolic dysfunction of left ventricle  Doing well this am s/p BIV  ICD upgrade.  CXR reveals stable leads, no ptx Device interrogation is reviewed and normal  Discharge to home this am Routine wound care and follow-up    [name], MD 01/23/2015 6:31 AM

## 2015-01-23 NOTE — Telephone Encounter (Signed)
Patient requesting how long he supposed to hold Metformin medications since his procedure (cath). Advised patient that he can resume Metformin after 48 hours (post cath) or sooner if his discharge instructions from the hospital state otherwise. Patient verbalized understanding and agreement with treatment plan.

## 2015-01-23 NOTE — Discharge Instructions (Signed)
° ° °  Supplemental Discharge Instructions for  Pacemaker/Defibrillator Patients  Activity No heavy lifting or vigorous activity with your left/right arm for 6 to 8 weeks.  Do not raise your left/right arm above your head for one week.  Gradually raise your affected arm as drawn below.           __  NO DRIVING for 7 days    ; you may begin driving on 0/24/09   .  WOUND CARE - Keep the wound area clean and dry.  Do not get this area wet for one week. No showers for one week; you may shower on 01/30/15    . - The tape/steri-strips on your wound will fall off; do not pull them off.  No bandage is needed on the site.  DO  NOT apply any creams, oils, or ointments to the wound area. - If you notice any drainage or discharge from the wound, any swelling or bruising at the site, or you develop a fever > 101? F after you are discharged home, call the office at once.  Special Instructions - You are still able to use cellular telephones; use the ear opposite the side where you have your pacemaker/defibrillator.  Avoid carrying your cellular phone near your device. - When traveling through airports, show security personnel your identification card to avoid being screened in the metal detectors.  Ask the security personnel to use the hand wand. - Avoid arc welding equipment, MRI testing (magnetic resonance imaging), TENS units (transcutaneous nerve stimulators).  Call the office for questions about other devices. - Avoid electrical appliances that are in poor condition or are not properly grounded. - Microwave ovens are safe to be near or to operate.  Additional information for defibrillator patients should your device go off: - If your device goes off ONCE and you feel fine afterward, notify the device clinic nurses. - If your device goes off ONCE and you do not feel well afterward, call 911. - If your device goes off TWICE, call 911. - If your device goes off THREE times in one day, call 911.  DO  NOT DRIVE YOURSELF OR A FAMILY MEMBER WITH A DEFIBRILLATOR TO THE HOSPITAL--CALL 911.

## 2015-01-30 ENCOUNTER — Telehealth: Payer: Self-pay | Admitting: Internal Medicine

## 2015-01-30 ENCOUNTER — Ambulatory Visit (INDEPENDENT_AMBULATORY_CARE_PROVIDER_SITE_OTHER): Payer: Medicare HMO | Admitting: *Deleted

## 2015-01-30 DIAGNOSIS — I428 Other cardiomyopathies: Secondary | ICD-10-CM

## 2015-01-30 DIAGNOSIS — I429 Cardiomyopathy, unspecified: Secondary | ICD-10-CM | POA: Diagnosis not present

## 2015-01-30 LAB — MDC_IDC_ENUM_SESS_TYPE_INCLINIC
Battery Voltage: 3.05 V
Brady Statistic AP VP Percent: 0.03 %
Brady Statistic AS VP Percent: 98.57 %
Brady Statistic AS VS Percent: 1.39 %
Brady Statistic RA Percent Paced: 0.05 %
Brady Statistic RV Percent Paced: 0.9 %
Date Time Interrogation Session: 20160324133416
HighPow Impedance: 228 Ohm
HighPow Impedance: 69 Ohm
Lead Channel Impedance Value: 437 Ohm
Lead Channel Impedance Value: 475 Ohm
Lead Channel Pacing Threshold Amplitude: 0.75 V
Lead Channel Pacing Threshold Amplitude: 2 V
Lead Channel Pacing Threshold Pulse Width: 0.4 ms
Lead Channel Pacing Threshold Pulse Width: 0.4 ms
Lead Channel Pacing Threshold Pulse Width: 0.4 ms
Lead Channel Sensing Intrinsic Amplitude: 10.125 mV
Lead Channel Setting Pacing Amplitude: 2 V
Lead Channel Setting Pacing Amplitude: 3 V
Lead Channel Setting Pacing Amplitude: 3.5 V
MDC IDC MSMT BATTERY REMAINING LONGEVITY: 105 mo
MDC IDC MSMT LEADCHNL RA PACING THRESHOLD AMPLITUDE: 0.75 V
MDC IDC MSMT LEADCHNL RA SENSING INTR AMPL: 5.125 mV
MDC IDC SET LEADCHNL LV PACING PULSEWIDTH: 0.4 ms
MDC IDC SET LEADCHNL RV PACING PULSEWIDTH: 0.4 ms
MDC IDC SET LEADCHNL RV SENSING SENSITIVITY: 0.3 mV
MDC IDC SET ZONE DETECTION INTERVAL: 350 ms
MDC IDC SET ZONE DETECTION INTERVAL: 360 ms
MDC IDC STAT BRADY AP VS PERCENT: 0.01 %
Zone Setting Detection Interval: 300 ms
Zone Setting Detection Interval: 340 ms

## 2015-01-30 NOTE — Telephone Encounter (Signed)
°  1. Has your device fired? No  2. Is you device beeping? No  3. Are you experiencing draining or swelling at device site? No  4. Are you calling to see if we received your device transmission? Yes  5. Have you passed out? No   Pt calling stating that he sent a transmission twice and wants to know if we received it and also wants to know if he is now cleared to have sex. Please call back and advise.

## 2015-01-30 NOTE — Progress Notes (Signed)
Wound check appointment. Steri-strips removed. Wound without redness or edema. Incision edges approximated, wound well healed. Normal device function. Thresholds, sensing, and impedances consistent with implant measurements. Device programmed at 3.5V for extra safety margin until 3 month visit. Histogram distribution appropriate for patient and level of activity. No mode switches or ventricular arrhythmias noted. Patient educated about wound care, arm mobility, lifting restrictions, shock plan--information card/magnet given to pt. Alert tones demonstrated for pt and pt aware to contact office if heard.  Changed max lead impedances for LIA and changed RA output from 1.50 to 2.00 ROV in 3 months with JA and Amber 03-05-15.

## 2015-01-30 NOTE — Telephone Encounter (Signed)
Informed pt that transmission was received. And that it is ok to have intercourse. Pt verbalized understanding.

## 2015-02-18 ENCOUNTER — Telehealth: Payer: Self-pay | Admitting: Cardiology

## 2015-02-18 NOTE — Telephone Encounter (Signed)
Transmission received. Informed pt that transmission was normal. Pt verbalized understanding.

## 2015-02-18 NOTE — Telephone Encounter (Signed)
Pt called and stated that he has been feeling dizzy. Informed pt to send manual transmission w/ home monitor. Pt verbalized understanding.

## 2015-02-19 ENCOUNTER — Encounter: Payer: Self-pay | Admitting: Family Medicine

## 2015-02-19 ENCOUNTER — Ambulatory Visit (INDEPENDENT_AMBULATORY_CARE_PROVIDER_SITE_OTHER): Payer: Medicare HMO | Admitting: Family Medicine

## 2015-02-19 VITALS — BP 124/68 | HR 74 | Temp 97.3°F | Wt 199.0 lb

## 2015-02-19 DIAGNOSIS — R42 Dizziness and giddiness: Secondary | ICD-10-CM | POA: Diagnosis not present

## 2015-02-19 NOTE — Progress Notes (Signed)
Subjective:    Patient ID: [name], male    DOB: 1945/12/29, 69 y.o.   MRN: [id number]  HPI Patient seen for evaluation of dizziness. On further questioning has had some mild vertigo symptoms which occur somewhat inconsistently and usually very transient. He's not had any syncope or presyncope episodes. He initially complained of some dark stools but states these are just slightly darker brown than usual. He has not had any melena. Recent hemoglobin 14.2 per labs with cardiology. He has not had any lightheadedness or orthostatic describes symptoms. No abdominal pain. No clear triggers for dizziness other than movement.  Denies any associated focal weakness, dysarthria, or any swallowing difficulties.  History nonischemic cardiomyopathy. Recent implantation of biventricular ICD. He has done well since then. He feels that he may have more energy since the procedure  Past Medical History  Diagnosis Date  . UTI (urinary tract infection)     15-20 years ago  . Sinus bradycardia   . Anemia   . Blood transfusion     "in [place]"  . Nonischemic cardiomyopathy   . History of colon polyps   . LBBB (left bundle branch block)   . Heart block AV second degree     permanent pacemaker 12/01/11 Medtronic  . AICD (automatic cardioverter/defibrillator) present   . Prostate cancer     "not been treated yet; on a wait and see" (01/22/2015)  . Hypertension   . High cholesterol   . Pneumonia X 1  . Type II diabetes mellitus   . Renal calculi 1980's    "passed them"  . Agent orange exposure     in [place]   Past Surgical History  Procedure Laterality Date  . Permanent pacemaker insertion  12/01/11    Medtronic implanted by Dr Sallyanne Kuster  . Inguinal hernia repair Left 1980  . Tonsillectomy    . Shrapnel surgery      "in [place]"  . Examination under anesthesia  04/06/2012    Procedure: EXAM UNDER ANESTHESIA;  Surgeon: Marcello Moores A. Cornett, MD;  Location: [facility];  Service: General;   Laterality: N/A;  . Nm myocar perf wall motion  01/21/2012    mod-severe perfusion defect due to infarct/scar w/mild perinfarct ischemia in the apical,basal inferoseptal,basal inferior,mid inferoseptal,midinferior and apical inferior regions  . Cardiac catheterization  01/27/2012    normal coronaries, dilated LV c/w nonischemic cardiomyopathy  . Permanent pacemaker insertion N/A 12/01/2011    Procedure: PERMANENT PACEMAKER INSERTION;  Surgeon: Sanda Klein, MD;  Location: [facility] CATH LAB;  Service: Cardiovascular;  Laterality: N/A;  . Left heart catheterization with coronary angiogram N/A 01/27/2012    Procedure: LEFT HEART CATHETERIZATION WITH CORONARY ANGIOGRAM;  Surgeon: Troy Sine, MD;  Location: [facility] C[facility] LAB;  Service: Cardiovascular;  Laterality: N/A;  . Bi-ventricular implantable cardioverter defibrillator  (crt-d)  01/22/2015  . Insert / replace / remove pacemaker    . Incision and drainage perirectal abscess  ~ 2010; 2015  . Basal cell carcinoma excision      "back, face, neck"  . Bi-ventricular implantable cardioverter defibrillator upgrade N/A 01/22/2015    Procedure: BI-VENTRICULAR IMPLANTABLE CARDIOVERTER DEFIBRILLATOR UPGRADE;  Surgeon: [name], MD;  Location: [facility] CATH LAB;  Service: Cardiovascular;  Laterality: N/A;    reports that he quit smoking about 30 years ago. His smoking use included Cigarettes. He has a 20 pack-year smoking history. He has never used smokeless tobacco. He reports that he drinks alcohol. He reports that he does not use illicit  drugs. family history includes Leukemia in his father; Ovarian cancer in his mother. Allergies  Allergen Reactions  . Sulfa Antibiotics Swelling    Swelling of lips only.      Review of Systems  Constitutional: Negative for fatigue.  Eyes: Negative for visual disturbance.  Respiratory: Negative for cough, chest tightness and shortness of breath.   Cardiovascular: Negative for chest pain, palpitations and leg swelling.    Endocrine: Negative for polydipsia and polyuria.  Neurological: Positive for dizziness. Negative for syncope, weakness, light-headedness and headaches.       Objective:   Physical Exam  Constitutional: He is oriented to person, place, and time. He appears well-developed and well-nourished.  HENT:  Right Ear: External ear normal.  Left Ear: External ear normal.  Mouth/Throat: Oropharynx is clear and moist.  Eyes: Pupils are equal, round, and reactive to light.  Neck: Neck supple. No thyromegaly present.  Cardiovascular: Normal rate and regular rhythm.   Pulmonary/Chest: Effort normal and breath sounds normal. No respiratory distress. He has no wheezes. He has no rales.  Musculoskeletal: He exhibits no edema.  Neurological: He is alert and oriented to person, place, and time. Coordination normal.  Cerebellar function normal. Gait normal. No focal weakness.          Assessment & Plan:  Dizziness. By description sounds like transient vertigo. Nonfocal exam. Blood pressure sitting 120/70 and standing 120/68. Suspect benign positional vertigo. No worrisome red flags. Handout on vertigo given. Consider vestibular rehabilitation if symptoms persist.   We have suggested follow-up in 3 months and obtain further labs including hemoglobin A1c

## 2015-02-19 NOTE — Progress Notes (Signed)
Pre visit review using our clinic review tool, if applicable. No additional management support is needed unless otherwise documented below in the visit note. 

## 2015-02-19 NOTE — Patient Instructions (Signed)

## 2015-02-20 ENCOUNTER — Encounter: Payer: Self-pay | Admitting: Cardiovascular Disease

## 2015-02-20 ENCOUNTER — Other Ambulatory Visit: Payer: Self-pay | Admitting: Family Medicine

## 2015-03-05 ENCOUNTER — Ambulatory Visit (INDEPENDENT_AMBULATORY_CARE_PROVIDER_SITE_OTHER): Payer: Medicare HMO | Admitting: Nurse Practitioner

## 2015-03-05 ENCOUNTER — Encounter: Payer: Self-pay | Admitting: Nurse Practitioner

## 2015-03-05 ENCOUNTER — Encounter: Payer: Self-pay | Admitting: Internal Medicine

## 2015-03-05 VITALS — BP 122/60 | HR 81 | Ht 75.0 in | Wt 198.2 lb

## 2015-03-05 DIAGNOSIS — Z4502 Encounter for adjustment and management of automatic implantable cardiac defibrillator: Secondary | ICD-10-CM

## 2015-03-05 DIAGNOSIS — I5022 Chronic systolic (congestive) heart failure: Secondary | ICD-10-CM | POA: Diagnosis not present

## 2015-03-05 DIAGNOSIS — I447 Left bundle-branch block, unspecified: Secondary | ICD-10-CM

## 2015-03-05 DIAGNOSIS — I429 Cardiomyopathy, unspecified: Secondary | ICD-10-CM

## 2015-03-05 DIAGNOSIS — I428 Other cardiomyopathies: Secondary | ICD-10-CM

## 2015-03-05 LAB — MDC_IDC_ENUM_SESS_TYPE_INCLINIC
Battery Remaining Longevity: 92 mo
Battery Voltage: 3.04 V
Brady Statistic AP VP Percent: 0.04 %
Brady Statistic AS VP Percent: 98.6 %
Brady Statistic RA Percent Paced: 0.06 %
Brady Statistic RV Percent Paced: 4.3 %
Date Time Interrogation Session: 20160427142520
HIGH POWER IMPEDANCE MEASURED VALUE: 152 Ohm
HighPow Impedance: 71 Ohm
Lead Channel Impedance Value: 418 Ohm
Lead Channel Impedance Value: 494 Ohm
Lead Channel Pacing Threshold Amplitude: 0.5 V
Lead Channel Pacing Threshold Amplitude: 2.75 V
Lead Channel Pacing Threshold Pulse Width: 0.4 ms
Lead Channel Pacing Threshold Pulse Width: 0.4 ms
Lead Channel Pacing Threshold Pulse Width: 0.8 ms
Lead Channel Sensing Intrinsic Amplitude: 13 mV
Lead Channel Setting Pacing Amplitude: 3.5 V
Lead Channel Setting Pacing Pulse Width: 0.8 ms
MDC IDC MSMT LEADCHNL RA SENSING INTR AMPL: 4.875 mV
MDC IDC MSMT LEADCHNL RV PACING THRESHOLD AMPLITUDE: 0.5 V
MDC IDC SET LEADCHNL LV PACING AMPLITUDE: 3.5 V
MDC IDC SET LEADCHNL RA PACING AMPLITUDE: 2 V
MDC IDC SET LEADCHNL RV PACING PULSEWIDTH: 0.4 ms
MDC IDC SET LEADCHNL RV SENSING SENSITIVITY: 0.3 mV
MDC IDC SET ZONE DETECTION INTERVAL: 300 ms
MDC IDC SET ZONE DETECTION INTERVAL: 360 ms
MDC IDC STAT BRADY AP VS PERCENT: 0.01 %
MDC IDC STAT BRADY AS VS PERCENT: 1.34 %
Zone Setting Detection Interval: 340 ms
Zone Setting Detection Interval: 350 ms

## 2015-03-05 NOTE — Progress Notes (Signed)
Electrophysiology Office Note Date: 03/05/2015  ID:  Kevin [name] Sep 25, 1946, MRN [id number]  PCP: Kevin Post, Kevin [name] Primary Cardiologist: Kevin [name] Electrophysiologist: Kevin [name]  CC: 6 week [name] CRT upgrade follow up  Kevin [name] is a 69 y.o. male is seen today for Kevin [name].  He presents today for follow up 6 weeks [name] CRTD upgrade electrophysiology followup.  He underwent pacemaker implantation originally in 2013 and subsequently developed NICM with worsening shortness of breath and fatigue.  His EF remained depressed despite optimal medical therapy.  Since upgrade, the patient reports doing very well. His shortness of breath and fatigue have improved with CRT.  He denies chest pain, palpitations, dyspnea, PND, orthopnea, nausea, vomiting, dizziness, syncope, edema, weight gain, or early satiety.  He has not had ICD shocks.   Device History: MDT dual chamber pacemaker implanted 11/2011 for Mobtiz II heart block (Kevin [name]); upgrade to MDT CRTD 01/2015 for non ischemic cardiomyopathy, CHF, pacing induced LBBB by Kevin [name] History of appropriate therapy: No History of AAD therapy: No   Past Medical History  Diagnosis Date  . UTI (urinary tract infection)     15-20 years ago  . Sinus bradycardia   . Anemia   . Blood transfusion     "in [place]"  . Nonischemic cardiomyopathy   . History of colon polyps   . LBBB (left bundle branch block)   . Heart block AV second degree     permanent pacemaker 12/01/11 Medtronic  . AICD (automatic cardioverter/defibrillator) present   . Prostate cancer     "not been treated yet; on a wait and see" (01/22/2015)  . Hypertension   . High cholesterol   . Pneumonia X 1  . Type II diabetes mellitus   . Renal calculi 1980's    "passed them"  . Agent orange exposure     in [place]   Past Surgical History  Procedure Laterality Date  . Permanent pacemaker insertion  12/01/11    Medtronic implanted by Kevin [name]  . Inguinal hernia  repair Left 1980  . Tonsillectomy    . Shrapnel surgery      "in [place]"  . Examination under anesthesia  04/06/2012    Procedure: EXAM UNDER ANESTHESIA;  Surgeon: Kevin Moores A. Cornett, Kevin [name];  Location: [facility];  Service: General;  Laterality: N/A;  . Nm myocar perf wall motion  01/21/2012    mod-severe perfusion defect due to infarct/scar w/mild perinfarct ischemia in the apical,basal inferoseptal,basal inferior,mid inferoseptal,midinferior and apical inferior regions  . Cardiac catheterization  01/27/2012    normal coronaries, dilated LV [name]/w nonischemic cardiomyopathy  . Permanent pacemaker insertion N/A 12/01/2011    Procedure: PERMANENT PACEMAKER INSERTION;  Surgeon: Kevin [name];  Location: [facility] CATH LAB;  Service: Cardiovascular;  Laterality: N/A;  . Left heart catheterization with coronary angiogram N/A 01/27/2012    Procedure: LEFT HEART CATHETERIZATION WITH CORONARY ANGIOGRAM;  Surgeon: Kevin Sine, Kevin [name];  [name];  Service: Cardiovascular;  Laterality: N/A;  . Bi-ventricular implantable cardioverter defibrillator  (crt-d)  01/22/2015  . Insert / replace / remove pacemaker    . Incision and drainage perirectal abscess  ~ 2010; 2015  . Basal cell carcinoma excision      "back, face, neck"  . Bi-ventricular implantable cardioverter defibrillator upgrade N/A 01/22/2015    Procedure: BI-VENTRICULAR IMPLANTABLE CARDIOVERTER DEFIBRILLATOR UPGRADE;  Surgeon: Kevin [name];  Location[name];  Service: Cardiovascular;  Laterality: N/A;    Current Outpatient  Prescriptions  Medication Sig Dispense Refill  . ACCU-CHEK COMPACT PLUS test strip 1 each by Other route as needed.   0  . aspirin 81 MG tablet Take 81 mg by mouth daily.      Marland Kitchen atorvastatin (LIPITOR) 80 MG tablet Take 40 mg by mouth daily at 6 PM.    . buPROPion (WELLBUTRIN XL) 150 MG 24 hr tablet Take 150 mg by mouth daily.    . carvedilol (COREG) 12.5 MG tablet Take 1 tablet (12.5 mg total) by mouth 2 (two) times  daily with a meal. 180 tablet 3  . clobetasol ointment (TEMOVATE) 8.18 % Apply 1 application topically daily as needed (itching).     . furosemide (LASIX) 40 MG tablet Take 0.5 tablets (20 mg total) by mouth daily with breakfast. 90 tablet 3  . glipiZIDE (GLUCOTROL XL) 5 MG 24 hr tablet Take 1 tablet (5 mg total) by mouth daily with breakfast. 30 tablet 5  . latanoprost (XALATAN) 0.005 % ophthalmic solution Place 1 drop into the right eye daily.     Marland Kitchen LORazepam (ATIVAN) 0.5 MG tablet Take 0.5 mg by mouth daily as needed for anxiety.    Marland Kitchen losartan (COZAAR) 100 MG tablet Take 50 mg by mouth daily.    . SitaGLIPtin-MetFORMIN HCl [id number] MG TB24 Take 1 tablet by mouth daily.    Marland Kitchen spironolactone (ALDACTONE) 25 MG tablet Take 0.5 tablets (12.5 mg total) by mouth daily. 45 tablet 3  . VIAGRA 100 MG tablet TAKE ONE TABLET BY MOUTH AS NEEDED FOR ERECTILE DYSFUNCTION. 10 tablet 3   No current facility-administered medications for this visit.    Allergies:   Sulfa antibiotics   Social History: History   Social History  . Marital Status: Married    Spouse Name: N/A  . Number of Children: 0  . Years of Education: N/A   Occupational History  . morgage banker    Social History Main Topics  . Smoking status: Former Smoker -- 1.00 packs/day for 20 years    Types: Cigarettes    Quit date: 04/13/1984  . Smokeless tobacco: Never Used  . Alcohol Use: Yes     Comment: 01/22/2015 "maybe 3 beers or glasses of wine /month"  . Drug Use: No  . Sexual Activity: Not on file   Other Topics Concern  . Not on file   Social History Narrative   Pt lives in [place] with spouse.  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    Family History: Family History  Problem Relation Age of Onset  . Ovarian cancer Mother   . Leukemia Father     Review of Systems: All other systems reviewed and are otherwise negative except as noted above.   Physical Exam: VS:  BP 122/60 mmHg   Pulse 81  Ht 6\' 3"  (1.905 m)  Wt 198 lb 3.2 oz (89.903 kg)  BMI 24.77 kg/m2 , BMI Body mass index is 24.77 kg/(m^2).  GEN- The patient is well appearing, alert and oriented x 3 today.   HEENT: normocephalic, atraumatic; sclera clear, conjunctiva pink; hearing intact; oropharynx clear; neck supple, no JVP Lymph- no cervical lymphadenopathy Lungs- Clear to ausculation bilaterally, normal work of breathing.  No wheezes, rales, rhonchi Heart- Regular rate and rhythm (paced) GI- soft, non-tender, non-distended, bowel sounds present  Extremities- no clubbing, cyanosis, or edema; DP/PT/radial pulses 2+ bilaterally MS- no significant deformity or atrophy Skin- warm and dry, no rash or lesion; ICD pocket well healed  Psych- euthymic mood, full affect Neuro- strength and sensation are intact  ICD interrogation- reviewed in detail today,  See PACEART report  EKG:  EKG is ordered today. The ekg ordered today shows sinus rhythm with ventricular pacing, QRS 144, unchanged from [name] implant EKG  Recent Labs: 09/16/2014: ALT 9 01/15/2015: Hemoglobin 14.2; Platelets 147.0* 01/23/2015: BUN 12; Creatinine 0.81; Potassium 4.0; Sodium 138   Wt Readings from Last 3 Encounters:  03/05/15 198 lb 3.2 oz (89.903 kg)  02/19/15 199 lb (90.266 kg)  01/23/15 203 lb 7.8 oz (92.3 kg)     Assessment and Plan:  1.  Chronic systolic dysfunction euvolemic today Stable on an appropriate medical regimen Normal ICD function See Pace Art report No changes today LV threshold increased from implant, impedance stable.  Vector left the same, pulse width programmed out to 0.61msec.  Will need to be re-evaluated in 6 weeks at office visit with Kevin [name]. If still elevated, will need to evaluate different pacing vectors with EKG's.     Current medicines are reviewed at length with the patient today.   The patient does not have concerns regarding his medicines.  The following changes were made today:  none  Labs/ tests  ordered today include: none   Disposition:   Follow up with Kevin [name] in 6 weeks as scheduled.    Signed, Chanetta Marshall, NP 03/05/2015 2:16 PM  [facility] [place] [address] [phone] (office) [phone] (fax)

## 2015-03-05 NOTE — Patient Instructions (Signed)
Medication Instructions:  Your physician recommends that you continue on your current medications as directed. Please refer to the Current Medication list given to you today.  Labwork: None  Testing/Procedures: None  Follow-Up: Keep scheduled office visit with Dr. Rayann Heman on 6/16 at 10:45 am  Any Other Special Instructions Will Be Listed Below (If Applicable).

## 2015-04-10 ENCOUNTER — Telehealth: Payer: Self-pay | Admitting: Endocrinology

## 2015-04-10 ENCOUNTER — Other Ambulatory Visit: Payer: Self-pay | Admitting: *Deleted

## 2015-04-10 MED ORDER — GLIPIZIDE ER 5 MG PO TB24: 5.0000 mg | ORAL_TABLET | Freq: Every day | ORAL | Status: DC

## 2015-04-10 NOTE — Telephone Encounter (Signed)
rx sent

## 2015-04-10 NOTE — Telephone Encounter (Signed)
Patient's called and made an appointment   Please fill his medications    Thank you

## 2015-04-18 ENCOUNTER — Other Ambulatory Visit (INDEPENDENT_AMBULATORY_CARE_PROVIDER_SITE_OTHER): Payer: Medicare HMO

## 2015-04-18 DIAGNOSIS — IMO0002 Reserved for concepts with insufficient information to code with codable children: Secondary | ICD-10-CM

## 2015-04-18 DIAGNOSIS — E1165 Type 2 diabetes mellitus with hyperglycemia: Secondary | ICD-10-CM

## 2015-04-18 LAB — URINALYSIS, ROUTINE W REFLEX MICROSCOPIC
Bilirubin Urine: NEGATIVE
Hgb urine dipstick: NEGATIVE
Ketones, ur: NEGATIVE
LEUKOCYTES UA: NEGATIVE
Nitrite: NEGATIVE
RBC / HPF: NONE SEEN (ref 0–?)
Specific Gravity, Urine: 1.01 (ref 1.000–1.030)
Total Protein, Urine: NEGATIVE
URINE GLUCOSE: NEGATIVE
UROBILINOGEN UA: 0.2 (ref 0.0–1.0)
WBC UA: NONE SEEN (ref 0–?)
pH: 5.5 (ref 5.0–8.0)

## 2015-04-18 LAB — COMPREHENSIVE METABOLIC PANEL
ALBUMIN: 4.1 g/dL (ref 3.5–5.2)
ALT: 10 U/L (ref 0–53)
AST: 20 U/L (ref 0–37)
Alkaline Phosphatase: 63 U/L (ref 39–117)
BILIRUBIN TOTAL: 0.5 mg/dL (ref 0.2–1.2)
BUN: 19 mg/dL (ref 6–23)
CHLORIDE: 104 meq/L (ref 96–112)
CO2: 25 mEq/L (ref 19–32)
CREATININE: 0.95 mg/dL (ref 0.40–1.50)
Calcium: 9.2 mg/dL (ref 8.4–10.5)
GFR: 83.45 mL/min (ref >60.00)
GLUCOSE: 157 mg/dL — AB (ref 70–99)
POTASSIUM: 4.2 meq/L (ref 3.5–5.1)
Sodium: 136 mEq/L (ref 135–145)
Total Protein: 7.2 g/dL (ref 6.0–8.3)

## 2015-04-18 LAB — HEMOGLOBIN A1C: HEMOGLOBIN A1C: 7 % — AB (ref 4.6–6.5)

## 2015-04-23 ENCOUNTER — Ambulatory Visit: Payer: Medicare HMO | Admitting: Endocrinology

## 2015-04-23 ENCOUNTER — Other Ambulatory Visit: Payer: Self-pay

## 2015-04-24 ENCOUNTER — Encounter: Payer: Self-pay | Admitting: Internal Medicine

## 2015-04-24 ENCOUNTER — Encounter: Payer: Self-pay | Admitting: Endocrinology

## 2015-04-24 ENCOUNTER — Other Ambulatory Visit: Payer: Self-pay | Admitting: *Deleted

## 2015-04-24 ENCOUNTER — Ambulatory Visit (INDEPENDENT_AMBULATORY_CARE_PROVIDER_SITE_OTHER): Payer: Medicare HMO | Admitting: Endocrinology

## 2015-04-24 ENCOUNTER — Ambulatory Visit (INDEPENDENT_AMBULATORY_CARE_PROVIDER_SITE_OTHER): Payer: Medicare HMO | Admitting: Internal Medicine

## 2015-04-24 VITALS — BP 114/62 | HR 74 | Ht 75.0 in | Wt 200.8 lb

## 2015-04-24 VITALS — BP 118/62 | HR 79 | Temp 98.1°F | Resp 16 | Ht 75.0 in | Wt 199.0 lb

## 2015-04-24 DIAGNOSIS — I428 Other cardiomyopathies: Secondary | ICD-10-CM

## 2015-04-24 DIAGNOSIS — I472 Ventricular tachycardia, unspecified: Secondary | ICD-10-CM

## 2015-04-24 DIAGNOSIS — I429 Cardiomyopathy, unspecified: Secondary | ICD-10-CM

## 2015-04-24 DIAGNOSIS — E118 Type 2 diabetes mellitus with unspecified complications: Secondary | ICD-10-CM | POA: Diagnosis not present

## 2015-04-24 DIAGNOSIS — I519 Heart disease, unspecified: Secondary | ICD-10-CM | POA: Diagnosis not present

## 2015-04-24 DIAGNOSIS — E785 Hyperlipidemia, unspecified: Secondary | ICD-10-CM

## 2015-04-24 DIAGNOSIS — I495 Sick sinus syndrome: Secondary | ICD-10-CM | POA: Diagnosis not present

## 2015-04-24 LAB — CUP PACEART INCLINIC DEVICE CHECK
Brady Statistic AP VP Percent: 0.03 %
Brady Statistic AP VS Percent: 0.01 %
Brady Statistic AS VP Percent: 98.69 %
Brady Statistic AS VS Percent: 1.27 %
Brady Statistic RA Percent Paced: 0.04 %
Brady Statistic RV Percent Paced: 11.83 %
Date Time Interrogation Session: 20160616120836
HIGH POWER IMPEDANCE MEASURED VALUE: 77 Ohm
HighPow Impedance: 190 Ohm
Lead Channel Impedance Value: 418 Ohm
Lead Channel Impedance Value: 570 Ohm
Lead Channel Pacing Threshold Amplitude: 0.5 V
Lead Channel Pacing Threshold Pulse Width: 0.4 ms
Lead Channel Pacing Threshold Pulse Width: 0.5 ms
Lead Channel Sensing Intrinsic Amplitude: 13.25 mV
Lead Channel Setting Pacing Amplitude: 2 V
Lead Channel Setting Pacing Amplitude: 2.5 V
Lead Channel Setting Pacing Amplitude: 2.5 V
Lead Channel Setting Pacing Pulse Width: 0.5 ms
Lead Channel Setting Sensing Sensitivity: 0.3 mV
MDC IDC MSMT BATTERY REMAINING LONGEVITY: 106 mo
MDC IDC MSMT BATTERY VOLTAGE: 3.02 V
MDC IDC MSMT LEADCHNL LV PACING THRESHOLD AMPLITUDE: 1.25 V
MDC IDC MSMT LEADCHNL RA PACING THRESHOLD AMPLITUDE: 0.5 V
MDC IDC MSMT LEADCHNL RA PACING THRESHOLD PULSEWIDTH: 0.4 ms
MDC IDC MSMT LEADCHNL RA SENSING INTR AMPL: 4.25 mV
MDC IDC SET LEADCHNL RV PACING PULSEWIDTH: 0.4 ms
MDC IDC SET ZONE DETECTION INTERVAL: 300 ms
MDC IDC SET ZONE DETECTION INTERVAL: 350 ms
MDC IDC SET ZONE DETECTION INTERVAL: 360 ms
Zone Setting Detection Interval: 340 ms

## 2015-04-24 MED ORDER — SITAGLIP PHOS-METFORMIN HCL ER 100-1000 MG PO TB24: 1.0000 | ORAL_TABLET | Freq: Every day | ORAL | Status: DC

## 2015-04-24 NOTE — Patient Instructions (Signed)
Medication Instructions:  Your physician recommends that you continue on your current medications as directed. Please refer to the Current Medication list given to you today.   Labwork: None ordered  Testing/Procedures: None ordered  Follow-Up: Remote monitoring is used to monitor your Pacemaker of ICD from home. This monitoring reduces the number of office visits required to check your device to one time per year. It allows [place] to keep an eye on the functioning of your device to ensure it is working properly. You are scheduled for a device check from home on 07/24/15. You may send your transmission at any time that day. If you have a wireless device, the transmission will be sent automatically. After your physician reviews your transmission, you will receive a postcard with your next transmission date.   Your physician wants you to follow-up in: 6 months with Dr. Sallyanne Kuster (repeat Echo with Dr C) and 12 months with Chanetta Marshall, NP You will receive a reminder letter in the mail two months in advance. If you don't receive a letter, please call our office to schedule the follow-up appointment.  ICM clinic with Alvis Lemmings, RN  Any Other Special Instructions Will Be Listed Below (If Applicable).

## 2015-04-24 NOTE — Progress Notes (Signed)
Electrophysiology Office Note   Date:  04/24/2015   ID:  Kevin [name], [place] 03/01/1946, MRN [id number]  PCP:  [name], MD  Cardiologist:  Dr Sallyanne Kuster Primary Electrophysiologist: [name], MD    Chief Complaint  Patient presents with  . NICM  . Chronic systolic HF     History of Present Illness: Kevin [name] is a 69 y.o. male who presents today for electrophysiology evaluation.   Doing very well s/p device upgrade to CRT-D.  Reports improved energy and exercise tolerance.  Denies procedure related complications.  Today, he denies symptoms of palpitations, chest pain, shortness of breath, orthopnea, PND, lower extremity edema, claudication, dizziness, presyncope, syncope, bleeding, or neurologic sequela. The patient is tolerating medications without difficulties and is otherwise without complaint today.    Past Medical History  Diagnosis Date  . Anemia   . Nonischemic cardiomyopathy     a. MDT CRTD upgrade 2016  . History of colon polyps   . LBBB (left bundle branch block)   . Heart block AV second degree     a. s/p PPM implant with subsequent CRTD upgrade  . Prostate cancer     "not been treated yet; on a wait and see" (01/22/2015)  . Hypertension   . High cholesterol   . Type II diabetes mellitus   . Agent orange exposure     in [place]   Past Surgical History  Procedure Laterality Date  . Inguinal hernia repair Left 1980  . Tonsillectomy    . Shrapnel surgery      "in [place]"  . Examination under anesthesia  04/06/2012    Procedure: EXAM UNDER ANESTHESIA;  Surgeon: Marcello Moores A. Cornett, MD;  Location: [facility];  Service: General;  Laterality: N/A;  . Nm myocar perf wall motion  01/21/2012    mod-severe perfusion defect due to infarct/scar w/mild perinfarct ischemia in the apical,basal inferoseptal,basal inferior,mid inferoseptal,midinferior and apical inferior regions  . Cardiac catheterization  01/27/2012    normal coronaries, dilated LV c/w  nonischemic cardiomyopathy  . Permanent pacemaker insertion N/A 12/01/2011    Medtronic implanted by Dr Sallyanne Kuster  . Left heart catheterization with coronary angiogram N/A 01/27/2012    Procedure: LEFT HEART CATHETERIZATION WITH CORONARY ANGIOGRAM;  Surgeon: Troy Sine, MD;  Location: [facility] CATH LAB;  Service: Cardiovascular;  Laterality: N/A;  . Incision and drainage perirectal abscess  ~ 2010; 2015  . Basal cell carcinoma excision      "back, face, neck"  . Bi-ventricular implantable cardioverter defibrillator upgrade N/A 01/22/2015    MDT CRTD upgrade by Dr Rayann Heman     Current Outpatient Prescriptions  Medication Sig Dispense Refill  . ACCU-CHEK COMPACT PLUS test strip 1 each by Other route as needed.   0  . aspirin 81 MG tablet Take 81 mg by mouth daily.      Marland Kitchen atorvastatin (LIPITOR) 80 MG tablet Take 40 mg by mouth daily at 6 PM.    . buPROPion (WELLBUTRIN XL) 150 MG 24 hr tablet Take 150 mg by mouth daily.    . carvedilol (COREG) 12.5 MG tablet Take 1 tablet (12.5 mg total) by mouth 2 (two) times daily with a meal. 180 tablet 3  . clobetasol ointment (TEMOVATE) 0.30 % Apply 1 application topically daily as needed (itching).     Marland Kitchen glipiZIDE (GLUCOTROL XL) 5 MG 24 hr tablet Take 1 tablet (5 mg total) by mouth daily with breakfast. 30 tablet 0  . latanoprost (XALATAN) 0.005 %  ophthalmic solution Place 1 drop into the right eye daily.     Marland Kitchen LORazepam (ATIVAN) 0.5 MG tablet Take 0.5 mg by mouth daily as needed for anxiety.    Marland Kitchen losartan (COZAAR) 100 MG tablet Take 50 mg by mouth daily.    . SitaGLIPtin-MetFORMIN HCl [phone] MG TB24 Take 1 tablet by mouth daily. 30 tablet 5  . spironolactone (ALDACTONE) 25 MG tablet Take 0.5 tablets (12.5 mg total) by mouth daily. 45 tablet 3  . VIAGRA 100 MG tablet TAKE ONE TABLET BY MOUTH AS NEEDED FOR ERECTILE DYSFUNCTION. 10 tablet 3   No current facility-administered medications for this visit.    Allergies:   Sulfa antibiotics   Social History:   The patient  reports that he quit smoking about 31 years ago. His smoking use included Cigarettes. He has a 20 pack-year smoking history. He has never used smokeless tobacco. He reports that he drinks alcohol. He reports that he does not use illicit drugs.   Family History:  The patient's family history includes Heart disease in his father; Leukemia in his father; Ovarian cancer in his mother. There is no history of Diabetes.    ROS:  Please see the history of present illness.   All other systems are reviewed and negative.    PHYSICAL EXAM: VS:  BP 114/62 mmHg  Pulse 74  Ht 6\' 3"  (1.905 m)  Wt 91.082 kg (200 lb 12.8 oz)  BMI 25.10 kg/m2 , BMI Body mass index is 25.1 kg/(m^2). GEN: Well nourished, well developed, in no acute distress HEENT: normal Neck: no JVD, carotid bruits, or masses Cardiac: RRR; no murmurs, rubs, or gallops,no edema  Respiratory:  clear to auscultation bilaterally, normal work of breathing GI: soft, nontender, nondistended, + BS MS: no deformity or atrophy Skin: warm and dry, device pocket is well healed Neuro:  Strength and sensation are intact Psych: euthymic mood, full affect  EKG:  EKG is ordered today. The ekg ordered today shows sinus with BiV pacing  Device interrogation is reviewed today in detail.  See PaceArt for details.   Recent Labs: 01/15/2015: Hemoglobin 14.2; Platelets 147.0* 04/18/2015: ALT 10; BUN 19; Creatinine, Ser 0.95; Potassium 4.2; Sodium 136    Lipid Panel     Component Value Date/Time   CHOL 109 09/16/2014 0803   TRIG 62.0 09/16/2014 0803   HDL 29.60* 09/16/2014 0803   CHOLHDL 4 09/16/2014 0803   VLDL 12.4 09/16/2014 0803   LDLCALC 67 09/16/2014 0803   LDLDIRECT 58.6 09/24/2014 0824     Wt Readings from Last 3 Encounters:  04/24/15 91.082 kg (200 lb 12.8 oz)  04/24/15 90.266 kg (199 lb)  03/05/15 89.903 kg (198 lb 3.2 oz)     ASSESSMENT AND PLAN:   1.  Chronic systolic dysfunction with recent worsening of SOB.  LBBB Improved with CRT Reprogrammed today to L1-RV coil configuration for battery longevity (from LV1-LV4).  QRS is more favorable with this change.  2. 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   The patient does not have concerns regarding his medicines.  The following changes were made today:  none   Signed,  [name], MD  04/24/2015 11:35 AM     [facility] [address] [place] [address] [phone] (office) [phone] (fax)

## 2015-04-24 NOTE — Progress Notes (Signed)
Patient ID: Kevin [name], male   DOB: 07/01/1946, 69 y.o.   MRN: [id number]    Reason for Appointment:  Followup for Type 2 Diabetes  Referring physician:  History of Present Illness:          Diagnosis: Type 2 diabetes mellitus, date of diagnosis: 2001        Past history: His previous records are not available but initially his symptoms included weight loss He thinks he has been on metformin for several years and more recently has been taking 1000 mg twice a day [facility] His A1c was 6.9 in 2013 but a year later was 8.1 He had been on glipizide 5 mg twice a day in addition to his metformin 1 g twice a day  Prior to his consultation when his A1c was 10%  Recent history:  On his consultation he was given Januvia in addition and his glipizide was changed to glipizide ER 5 mg daily He is taking Janumet XR now  He says he is using his Accu-Chek meter now but not clear how often.   His glucose by recall is higher in the mornings on waking up, lab glucose was 157 fasting His A1c has stayed around 7% now Although his diet is overall fairly good he is asking about eating various foods Has been exercising at least 3 times a week, mostly with walking Hypoglycemia: None recently       Oral hypoglycemic drugs the patient is taking are: Glipizide ER 5 mg qd, Janumet XR 100/1000 daily Side effects from medications have been: None  Glucose monitoring:  done 1-2 times a day         Glucometer:  Accu-Chek compact     Blood Glucose readings  by recall usually about 130-150 in the morning and as low as 111 after exercise in the morning.  Does not check after meals   Glycemic control:    Lab Results  Component Value Date   HGBA1C 7.0* 04/18/2015   HGBA1C 7.1* 09/24/2014   HGBA1C 7.2* 09/16/2014   Lab Results  Component Value Date   LDLCALC 67 09/16/2014   CREATININE 0.95 04/18/2015     Self-care: The diet that the patient has been  following is: tries to limit portions, may get fried food when eating out, eating out at fast food about 4 days a week  Meals: 3 meals per day.  breakfast is at 7-8 am and is usually eating a biscuit or special K. cereal Usually eating baked chicken or chicken salad sandwiches for meals and will have fruits for snacks         Exercise:  30-40 minutes, 3-4 days a week on treadmill         Dietician visit: Most recent:2001, at [facility].               Compliance with the medical regimen: Inconsistent Retinal exam: Most recent:. 3/15   Weight history: Wt Readings from Last 3 Encounters:  04/24/15 199 lb (90.266 kg)  03/05/15 198 lb 3.2 oz (89.903 kg)  02/19/15 199 lb (90.266 kg)      Medication List       This list is accurate as of: 04/24/15  9:00 AM.  Always use your most recent med list.               ACCU-CHEK COMPACT PLUS test strip  Generic drug:  glucose blood  1 each by Other  route as needed.     aspirin 81 MG tablet  Take 81 mg by mouth daily.     atorvastatin 80 MG tablet  Commonly known as:  LIPITOR  Take 40 mg by mouth daily at 6 PM.     buPROPion 150 MG 24 hr tablet  Commonly known as:  WELLBUTRIN XL  Take 150 mg by mouth daily.     carvedilol 12.5 MG tablet  Commonly known as:  COREG  Take 1 tablet (12.5 mg total) by mouth 2 (two) times daily with a meal.     clobetasol ointment 0.05 %  Commonly known as:  TEMOVATE  Apply 1 application topically daily as needed (itching).     furosemide 40 MG tablet  Commonly known as:  LASIX  Take 0.5 tablets (20 mg total) by mouth daily with breakfast.     glipiZIDE 5 MG 24 hr tablet  Commonly known as:  GLUCOTROL XL  Take 1 tablet (5 mg total) by mouth daily with breakfast.     latanoprost 0.005 % ophthalmic solution  Commonly known as:  XALATAN  Place 1 drop into the right eye daily.     LORazepam 0.5 MG tablet  Commonly known as:  ATIVAN  Take 0.5 mg by mouth daily as needed for anxiety.     losartan  100 MG tablet  Commonly known as:  COZAAR  Take 50 mg by mouth daily.     SitaGLIPtin-MetFORMIN HCl [phone] MG Tb24  Take 1 tablet by mouth daily.     spironolactone 25 MG tablet  Commonly known as:  ALDACTONE  Take 0.5 tablets (12.5 mg total) by mouth daily.     VIAGRA 100 MG tablet  Generic drug:  sildenafil  TAKE ONE TABLET BY MOUTH AS NEEDED FOR ERECTILE DYSFUNCTION.        Allergies:  Allergies  Allergen Reactions  . Sulfa Antibiotics Swelling    Swelling of lips only.    Past Medical History  Diagnosis Date  . Anemia   . Nonischemic cardiomyopathy     a. MDT CRTD upgrade 2016  . History of colon polyps   . LBBB (left bundle branch block)   . Heart block AV second degree     a. s/p PPM implant with subsequent CRTD upgrade  . Prostate cancer     "not been treated yet; on a wait and see" (01/22/2015)  . Hypertension   . High cholesterol   . Type II diabetes mellitus   . Agent orange exposure     in [place]    Past Surgical History  Procedure Laterality Date  . Inguinal hernia repair Left 1980  . Tonsillectomy    . Shrapnel surgery      "in [place]"  . Examination under anesthesia  04/06/2012    Procedure: EXAM UNDER ANESTHESIA;  Surgeon: Marcello Moores A. Cornett, MD;  Location: [facility];  Service: General;  Laterality: N/A;  . Nm myocar perf wall motion  01/21/2012    mod-severe perfusion defect due to infarct/scar w/mild perinfarct ischemia in the apical,basal inferoseptal,basal inferior,mid inferoseptal,midinferior and apical inferior regions  . Cardiac catheterization  01/27/2012    normal coronaries, dilated LV c/w nonischemic cardiomyopathy  . Permanent pacemaker insertion N/A 12/01/2011    Medtronic implanted by Dr Sallyanne Kuster  . Left heart catheterization with coronary angiogram N/A 01/27/2012    Procedure: LEFT HEART CATHETERIZATION WITH CORONARY ANGIOGRAM;  Surgeon: Troy Sine, MD;  Location: [facility] CATH LAB;  Service: Cardiovascular;  Laterality: N/A;  .  Incision and drainage perirectal abscess  ~ 2010; 2015  . Basal cell carcinoma excision      "back, face, neck"  . Bi-ventricular implantable cardioverter defibrillator upgrade N/A 01/22/2015    MDT CRTD upgrade by Dr Rayann Heman    Family History  Problem Relation Age of Onset  . Ovarian cancer Mother   . Leukemia Father     Social History:  reports that he quit smoking about 31 years ago. His smoking use included Cigarettes. He has a 20 pack-year smoking history. He has never used smokeless tobacco. He reports that he drinks alcohol. He reports that he does not use illicit drugs.    Review of Systems   He has eye exams every 6 months of the [facility] but no records available      Lipids: He has been treated with Lipitor 40 mg        Lab Results  Component Value Date   CHOL 109 09/16/2014   HDL 29.60* 09/16/2014   LDLCALC 67 09/16/2014   LDLDIRECT 58.6 09/24/2014   TRIG 62.0 09/16/2014   CHOLHDL 4 09/16/2014     No history of numbness or tingling in his feet and no claudication  Hypertension: Mild and well controlled with losartan      Physical Examination:  BP 118/62 mmHg  Pulse 79  Temp(Src) 98.1 F (36.7 C) (Oral)  Resp 16  Ht 6\' 3"  (1.905 m)  Wt 199 lb (90.266 kg)  BMI 24.87 kg/m2  SpO2 95%  No edema  ASSESSMENT:  Diabetes type 2, uncontrolled    Patient has overall stable blood sugar control with using Janumet XR and glipizide ER His A1c is stable around 7% His lab glucose was fairly good but he does not remember his home readings and does not check his readings after meals at all He has several questions about diet and fruits and he will be referred to the dietitian for this No hypoglycemia with glipizide ER Weight has levered off  HYPERLIPIDEMIA: Appears well controlled  PLAN:   Continue same medications including Janumet XR and glipizide ER  Previous to change his blood sugar monitoring timings and do at least half of his blood sugars  after meals  Continue exercise regimen  Needs better diet, he agrees to see the dietitian now  Bring blood sugar monitor for download on each visit   Bonnie Roig 04/24/2015, 9:00 AM     Note: This office note was prepared with Dragon voice recognition system technology. Any transcriptional errors that result from this process are unintentional.

## 2015-04-24 NOTE — Patient Instructions (Signed)
Check blood sugars on waking up ..3  .. times a week Also check blood sugars about 2 hours after a meal and do this after different meals by rotation  Recommended blood sugar levels on waking up is 90-130 and about 2 hours after meal is 140-180 Please bring blood sugar monitor to each visit.  Bring copy of eye exam

## 2015-05-05 ENCOUNTER — Other Ambulatory Visit: Payer: Self-pay

## 2015-05-06 ENCOUNTER — Telehealth: Payer: Self-pay | Admitting: *Deleted

## 2015-05-06 ENCOUNTER — Telehealth: Payer: Self-pay | Admitting: Cardiology

## 2015-05-06 NOTE — Telephone Encounter (Signed)
Normal pacemaker check, no arrhythmia, no sign of fluid overload by [employer]

## 2015-05-06 NOTE — Telephone Encounter (Signed)
Lmtcb// sss  Per [facility]- CP is most likely something other than cardiac. NL remote. No evidence of CHF (NL optivol). Go to ER if CP persists.

## 2015-05-06 NOTE — Telephone Encounter (Signed)
Pt stated that he is having some chest pain but is feeling better now. Informed pt that I would have someone look at transmission and call him back. Pt agreed w/ this plan.

## 2015-05-06 NOTE — Telephone Encounter (Signed)
Spoke with patient- CP has subsided. Pt reports pains come on positionally. Wife insistent that pains have been since last device adjustment. Patient says that if he has problems tomorrow morning he will call back to schedule an appointment. Patient appreciative of phone call.

## 2015-05-13 ENCOUNTER — Other Ambulatory Visit: Payer: Self-pay | Admitting: Endocrinology

## 2015-05-22 ENCOUNTER — Telehealth: Payer: Self-pay | Admitting: Family Medicine

## 2015-05-22 NOTE — Telephone Encounter (Signed)
Pt request refill of the following: ACCU-CHEK COMPACT PLUS test strip   Phamacy: [facility]

## 2015-05-22 NOTE — Telephone Encounter (Signed)
ok 

## 2015-05-23 ENCOUNTER — Other Ambulatory Visit: Payer: Self-pay

## 2015-05-23 MED ORDER — ACCU-CHEK COMPACT PLUS VI STRP: 1.0000 | ORAL_STRIP | Status: DC | PRN

## 2015-05-26 ENCOUNTER — Other Ambulatory Visit: Payer: Self-pay | Admitting: *Deleted

## 2015-05-26 MED ORDER — SITAGLIP PHOS-METFORMIN HCL ER 100-1000 MG PO TB24: 1.0000 | ORAL_TABLET | Freq: Every day | ORAL | Status: DC

## 2015-05-27 ENCOUNTER — Telehealth: Payer: Self-pay | Admitting: Internal Medicine

## 2015-05-27 NOTE — Telephone Encounter (Signed)
Pt states flip-flopping occurs primarily when laying on right side. When changing to left side, sensation goes away. Pt has also infrequently felt stimulation while standing.   Appt made w/ device clinic for 05/28/15 to reprogram LV vector.

## 2015-05-27 NOTE — Telephone Encounter (Signed)
New message     Pt sent in a remote transmission last night because his heart was "flip flopping".  Pt had his pacemaker adjusted about 53mo ago and the "flip flopping" has gotten worse.  Sometimes he has chest pain during the flip flopping.  Do you think his pacemaker needs to be adjusted again?

## 2015-05-27 NOTE — Telephone Encounter (Signed)
LMOVM w/ my direct #. 

## 2015-05-28 ENCOUNTER — Ambulatory Visit (INDEPENDENT_AMBULATORY_CARE_PROVIDER_SITE_OTHER): Payer: Self-pay | Admitting: *Deleted

## 2015-05-28 DIAGNOSIS — T82897A Other specified complication of cardiac prosthetic devices, implants and grafts, initial encounter: Secondary | ICD-10-CM

## 2015-05-28 LAB — CUP PACEART INCLINIC DEVICE CHECK
Battery Voltage: 2.99 V
Brady Statistic AP VP Percent: 0.03 %
Brady Statistic AP VS Percent: 0.01 %
Brady Statistic AS VP Percent: 99.1 %
Brady Statistic RA Percent Paced: 0.04 %
Date Time Interrogation Session: 20160720093518
HighPow Impedance: 209 Ohm
HighPow Impedance: 69 Ohm
Lead Channel Impedance Value: 627 Ohm
Lead Channel Pacing Threshold Amplitude: 0.5 V
Lead Channel Pacing Threshold Pulse Width: 0.4 ms
Lead Channel Pacing Threshold Pulse Width: 0.4 ms
Lead Channel Pacing Threshold Pulse Width: 0.5 ms
Lead Channel Sensing Intrinsic Amplitude: 10.375 mV
Lead Channel Sensing Intrinsic Amplitude: 4.25 mV
Lead Channel Setting Pacing Amplitude: 2 V
Lead Channel Setting Pacing Amplitude: 2.25 V
Lead Channel Setting Pacing Pulse Width: 0.4 ms
Lead Channel Setting Pacing Pulse Width: 0.5 ms
Lead Channel Setting Sensing Sensitivity: 0.3 mV
MDC IDC MSMT BATTERY REMAINING LONGEVITY: 111 mo
MDC IDC MSMT LEADCHNL LV PACING THRESHOLD AMPLITUDE: 1.375 V
MDC IDC MSMT LEADCHNL RA IMPEDANCE VALUE: 399 Ohm
MDC IDC MSMT LEADCHNL RA SENSING INTR AMPL: 4.25 mV
MDC IDC MSMT LEADCHNL RV PACING THRESHOLD AMPLITUDE: 0.5 V
MDC IDC MSMT LEADCHNL RV SENSING INTR AMPL: 10.375 mV
MDC IDC SET LEADCHNL RV PACING AMPLITUDE: 2.5 V
MDC IDC SET ZONE DETECTION INTERVAL: 300 ms
MDC IDC STAT BRADY AS VS PERCENT: 0.85 %
MDC IDC STAT BRADY RV PERCENT PACED: 41.77 %
Zone Setting Detection Interval: 340 ms
Zone Setting Detection Interval: 350 ms
Zone Setting Detection Interval: 360 ms

## 2015-05-28 NOTE — Progress Notes (Signed)
Pt having diaphragmatic stim when laying on right side. Pt also experiences sensations intermittently when standing. Changed LV vector back to original setting; from LV1>>RVCoil to LV4>>LV1. Follow up as planned. Carelink 07/24/15, ROV w/ [place] 12/16, & w/ [facility] 6/17.

## 2015-05-29 ENCOUNTER — Ambulatory Visit (INDEPENDENT_AMBULATORY_CARE_PROVIDER_SITE_OTHER): Payer: Medicare HMO | Admitting: *Deleted

## 2015-05-29 ENCOUNTER — Encounter: Payer: Self-pay | Admitting: *Deleted

## 2015-05-29 DIAGNOSIS — I519 Heart disease, unspecified: Secondary | ICD-10-CM | POA: Diagnosis not present

## 2015-05-29 DIAGNOSIS — Z95810 Presence of automatic (implantable) cardiac defibrillator: Secondary | ICD-10-CM

## 2015-05-29 NOTE — Progress Notes (Signed)
EPIC Encounter for ICM Monitoring  Patient Name: Kevin [name] is a 69 y.o. male Date: 05/29/2015 Primary Care Physican: [name], MD Primary Cardiologist: [name] Electrophysiologist: [name] Dry Weight: 195 lbs  Bi-V pacing: 99.2%       In the past month, have you:  1. Gained more than 2 pounds in a day or more than 5 pounds in a week? no  2. Had changes in your medications (with verification of current medications)? no  3. Had more shortness of breath than is usual for you? no  4. Limited your activity because of shortness of breath? no  5. Not been able to sleep because of shortness of breath? no  6. Had increased swelling in your feet or ankles? no  7. Had symptoms of dehydration (dizziness, dry mouth, increased thirst, decreased urine output) no  8. Had changes in sodium restriction? no  9. Been compliant with medication? Yes   ICM trend:   Follow-up plan: ICM clinic phone appointment: 06/30/15. This is my first ICM encounter with the patient. He is doing well today. He was seen in the device clinic yesterday for diaphragmatic stimulation. He feels this has improved. No changes made today.   Copy of note sent to patient's primary care physician, primary cardiologist, and device following physician.  Alvis Lemmings, RN, BSN 05/29/2015 1:21 PM

## 2015-05-29 NOTE — Telephone Encounter (Signed)
Pt states he does not feel same sensations when laying on side as he did prior to yesterday's reprogramming. He states what is feeling is different. I asked for clarification as to what his sensations are. He states he knows "it's there". States he knows it's in his chest. States not pain, not irritation. States sensations are not like his previous pacemaker.   I updated pt he has a larger, more complex device than his previous ppm. Pt expressed understanding. Pt states he did play a round of golf recently without concern. Pt states he will call if previous symptoms reoccur or if other concerns arise.

## 2015-06-09 ENCOUNTER — Ambulatory Visit (INDEPENDENT_AMBULATORY_CARE_PROVIDER_SITE_OTHER): Payer: Medicare HMO | Admitting: Family Medicine

## 2015-06-09 ENCOUNTER — Encounter: Payer: Self-pay | Admitting: Family Medicine

## 2015-06-09 VITALS — BP 120/78 | HR 68 | Temp 98.1°F | Wt 203.0 lb

## 2015-06-09 DIAGNOSIS — R42 Dizziness and giddiness: Secondary | ICD-10-CM

## 2015-06-09 NOTE — Progress Notes (Signed)
Pre visit review using our clinic review tool, if applicable. No additional management support is needed unless otherwise documented below in the visit note. 

## 2015-06-09 NOTE — Patient Instructions (Signed)

## 2015-06-09 NOTE — Progress Notes (Signed)
Subjective:    Patient ID: Kevin [name], male    DOB: Nov 29, 1945, 69 y.o.   MRN: [id number]  HPI Patient here with a few day history of vertigo. He thinks this started around last Thursday. He has intermittent symptoms of vertigo. He has some chronic ringing in the ear but this is unchanged. No sudden hearing changes. No syncope or presyncope. Denies any orthostatic type symptoms. He did fall last Thursday but he thinks he was going on some stairs and missed the step. Denies any headache. No focal weakness. No speech changes. No ataxia.  He does have multiple chronic process including type 2 diabetes, hypertension, nonischemic cardiomyopathy, history of ventricular tachycardia, sick sinus syndrome. He is not describing any palpitations. No chest pains. No hypoglycemic symptoms.  He does have pacemaker  Past Medical History  Diagnosis Date  . Anemia   . Nonischemic cardiomyopathy     a. MDT CRTD upgrade 2016  . History of colon polyps   . LBBB (left bundle branch block)   . Heart block AV second degree     a. s/p PPM implant with subsequent CRTD upgrade  . Prostate cancer     "not been treated yet; on a wait and see" (01/22/2015)  . Hypertension   . High cholesterol   . Type II diabetes mellitus   . Agent orange exposure     in [place]   Past Surgical History  Procedure Laterality Date  . Inguinal hernia repair Left 1980  . Tonsillectomy    . Shrapnel surgery      "in [place]"  . Examination under anesthesia  04/06/2012    Procedure: EXAM UNDER ANESTHESIA;  Surgeon: Marcello Moores A. Cornett, MD;  Location: [facility];  Service: General;  Laterality: N/A;  . Nm myocar perf wall motion  01/21/2012    mod-severe perfusion defect due to infarct/scar w/mild perinfarct ischemia in the apical,basal inferoseptal,basal inferior,mid inferoseptal,midinferior and apical inferior regions  . Cardiac catheterization  01/27/2012    normal coronaries, dilated LV c/w nonischemic cardiomyopathy  .  Permanent pacemaker insertion N/A 12/01/2011    Medtronic implanted by Dr Sallyanne Kuster  . Left heart catheterization with coronary angiogram N/A 01/27/2012    Procedure: LEFT HEART CATHETERIZATION WITH CORONARY ANGIOGRAM;  Surgeon: Troy Sine, MD;  Location: [facility] CATH LAB;  Service: Cardiovascular;  Laterality: N/A;  . Incision and drainage perirectal abscess  ~ 2010; 2015  . Basal cell carcinoma excision      "back, face, neck"  . Bi-ventricular implantable cardioverter defibrillator upgrade N/A 01/22/2015    MDT CRTD upgrade by Dr Rayann Heman    reports that he quit smoking about 31 years ago. His smoking use included Cigarettes. He has a 20 pack-year smoking history. He has never used smokeless tobacco. He reports that he drinks alcohol. He reports that he does not use illicit drugs. family history includes Heart disease in his father; Leukemia in his father; Ovarian cancer in his mother. There is no history of Diabetes. Allergies  Allergen Reactions  . Sulfa Antibiotics Swelling    Swelling of lips only.      Review of Systems  Constitutional: Negative for fever, chills, fatigue and unexpected weight change.  HENT: Negative for congestion and sinus pressure.   Eyes: Negative for visual disturbance.  Respiratory: Negative for cough, chest tightness and shortness of breath.   Cardiovascular: Negative for chest pain, palpitations and leg swelling.  Endocrine: Negative for polydipsia and polyuria.  Genitourinary: Negative for dysuria.  Neurological: Positive for dizziness. Negative for seizures, syncope, weakness, light-headedness and headaches.  Psychiatric/Behavioral: Negative for confusion.       Objective:   Physical Exam  Constitutional: He is oriented to person, place, and time. He appears well-developed and well-nourished. No distress.  HENT:  Head: Normocephalic and atraumatic.  Right Ear: External ear normal.  Left Ear: External ear normal.  Mouth/Throat: Oropharynx is clear  and moist.  Eyes: Pupils are equal, round, and reactive to light.  Neck: Neck supple.  Cardiovascular: Normal rate and regular rhythm.  Exam reveals no gallop.   Pulmonary/Chest: Effort normal and breath sounds normal. No respiratory distress. He has no wheezes. He has no rales.  Musculoskeletal: He exhibits no edema.  Neurological: He is alert and oriented to person, place, and time. No cranial nerve deficit. Coordination normal.  Full strength throughout. Normal gait. Normal finger to nose testing. He does have some subtle horizontal nystagmus. No vertical nystagmus. No reproducible vertigo with position change  Psychiatric: He has a normal mood and affect. His behavior is normal.          Assessment & Plan:  Dizziness. By description this sounds like vertigo. Benign features. Nonfocal exam except for some mild horizontal nystagmus. Observe for now. Follow-up promptly for any worrisome red flag symptoms and we reviewed these with handout given. Consider vestibular rehabilitation if symptoms not resolving by next week

## 2015-06-11 ENCOUNTER — Encounter: Payer: Self-pay | Admitting: Internal Medicine

## 2015-06-30 ENCOUNTER — Telehealth: Payer: Self-pay | Admitting: Cardiology

## 2015-06-30 ENCOUNTER — Ambulatory Visit (INDEPENDENT_AMBULATORY_CARE_PROVIDER_SITE_OTHER): Payer: Medicare HMO | Admitting: *Deleted

## 2015-06-30 DIAGNOSIS — T82190A Other mechanical complication of cardiac electrode, initial encounter: Secondary | ICD-10-CM

## 2015-06-30 DIAGNOSIS — I428 Other cardiomyopathies: Secondary | ICD-10-CM

## 2015-06-30 DIAGNOSIS — I429 Cardiomyopathy, unspecified: Secondary | ICD-10-CM | POA: Diagnosis not present

## 2015-06-30 NOTE — Telephone Encounter (Signed)
Spoke with pt and reminded pt of remote transmission that is due today. Pt verbalized understanding.   

## 2015-07-03 ENCOUNTER — Ambulatory Visit (HOSPITAL_COMMUNITY)
Admission: RE | Admit: 2015-07-03 | Discharge: 2015-07-03 | Disposition: A | Discharge status: Home / Self Care | Payer: Medicare HMO | Source: Ambulatory Visit | Attending: Internal Medicine | Admitting: Internal Medicine

## 2015-07-03 DIAGNOSIS — T82190A Other mechanical complication of cardiac electrode, initial encounter: Secondary | ICD-10-CM

## 2015-07-03 DIAGNOSIS — Z950 Presence of cardiac pacemaker: Secondary | ICD-10-CM | POA: Diagnosis not present

## 2015-07-03 NOTE — Progress Notes (Signed)
Late entry from 07/02/2015.  EPIC Encounter for ICM Monitoring  Patient Name: Kevin [name] is a 69 y.o. male Date: 07/03/2015 Primary Care Physican: [name], MD Primary Cardiologist: Cro[place] Electrophysiologist: [name] Dry Weight: 203 lbs       In the past month, have you:  1. Gained more than 2 pounds in a day or more than 5 pounds in a week? No but weight has increased in the last month from 195 lbs to 203 lbs.   2. Had changes in your medications (with verification of current medications)? no  3. Had more shortness of breath than is usual for you? no  4. Limited your activity because of shortness of breath? no  5. Not been able to sleep because of shortness of breath? no  6. Had increased swelling in your feet or ankles? no  7. Had symptoms of dehydration (dizziness, dry mouth, increased thirst, decreased urine output) no  8. Had changes in sodium restriction? no  9. Been compliant with medication? Yes   ICM trend:  Follow-up plan: ICM clinic phone appointment 08/04/2015.  Optivol transmission revealed fluid index above baseline line and thoracic impedance below baseline ~05/28/2015 to 06/08/2015 and then trending to baseline since ~06/09/2015.  Changes to device were made 05/28/2015 by device clinic.  Patient reported he feels a "flip flop" in his chest along with sensation in his chest but can't describe if it is actually pain. He described it as a sensation starting in bottom of throat to the chest area.   He reported since going from pacemaker to defibrillator that he has noticed the device being there.  He does remain active by playing golf.  He also reported dizziness and was seen by Dr Elease Hashimoto for this symptom.  He denied any shortness of breath, leg/feet swelling or palpitations.  Reviewed Optivol transmission with Debroah Loop, RN in device clinic and she discussed with Dr Rayann Heman.  Recommendation for patient to have chest x-ray on Thursday, 07/03/2015 and  appointment for device clinic check on Monday 08/07/2015 at 9:30AM.     Patient notified of appointments.    Copy of note sent to patient's primary care physician, primary cardiologist, and device following physician.  Rosalene Billings, RN, CCM 07/03/2015 8:28 AM

## 2015-07-07 ENCOUNTER — Encounter: Payer: Self-pay | Admitting: Internal Medicine

## 2015-07-07 ENCOUNTER — Ambulatory Visit (INDEPENDENT_AMBULATORY_CARE_PROVIDER_SITE_OTHER): Payer: Medicare HMO | Admitting: *Deleted

## 2015-07-07 DIAGNOSIS — I428 Other cardiomyopathies: Secondary | ICD-10-CM

## 2015-07-07 DIAGNOSIS — I495 Sick sinus syndrome: Secondary | ICD-10-CM

## 2015-07-07 DIAGNOSIS — I447 Left bundle-branch block, unspecified: Secondary | ICD-10-CM | POA: Diagnosis not present

## 2015-07-07 DIAGNOSIS — I472 Ventricular tachycardia, unspecified: Secondary | ICD-10-CM

## 2015-07-07 DIAGNOSIS — I429 Cardiomyopathy, unspecified: Secondary | ICD-10-CM | POA: Diagnosis not present

## 2015-07-09 LAB — CUP PACEART INCLINIC DEVICE CHECK
Battery Remaining Longevity: 76 mo
Battery Voltage: 2.99 V
Brady Statistic AP VP Percent: 0.03 %
Brady Statistic AP VS Percent: 0.01 %
Brady Statistic AS VP Percent: 99.22 %
Brady Statistic AS VS Percent: 0.74 %
Brady Statistic RA Percent Paced: 0.04 %
Brady Statistic RV Percent Paced: 48.06 %
Date Time Interrogation Session: 20160829135520
HighPow Impedance: 152 Ohm
HighPow Impedance: 76 Ohm
Lead Channel Impedance Value: 418 Ohm
Lead Channel Impedance Value: 589 Ohm
Lead Channel Pacing Threshold Amplitude: 0.375 V
Lead Channel Pacing Threshold Amplitude: 0.625 V
Lead Channel Pacing Threshold Amplitude: 3 V
Lead Channel Pacing Threshold Pulse Width: 0.4 ms
Lead Channel Pacing Threshold Pulse Width: 0.4 ms
Lead Channel Pacing Threshold Pulse Width: 1 ms
Lead Channel Sensing Intrinsic Amplitude: 13.125 mV
Lead Channel Sensing Intrinsic Amplitude: 13.5 mV
Lead Channel Sensing Intrinsic Amplitude: 4 mV
Lead Channel Sensing Intrinsic Amplitude: 5.5 mV
Lead Channel Setting Pacing Amplitude: 2 V
Lead Channel Setting Pacing Amplitude: 2.5 V
Lead Channel Setting Pacing Amplitude: 3.5 V
Lead Channel Setting Pacing Pulse Width: 0.4 ms
Lead Channel Setting Pacing Pulse Width: 1 ms
Lead Channel Setting Sensing Sensitivity: 0.3 mV
Zone Setting Detection Interval: 300 ms
Zone Setting Detection Interval: 340 ms
Zone Setting Detection Interval: 350 ms
Zone Setting Detection Interval: 360 ms

## 2015-07-09 NOTE — Progress Notes (Signed)
CRT-D device check in office. RA/RV thresholds and sensing consistent with previous device measurements. LV threshold increase noted in present vector---all vectors tested and diaphragmatic stim was noted in all vectors besides for present programming. LV output changed from 5.0V @0 .5ms to 3.5V @1 .86ms (+0.5V safety margin). Lead impedance trends stable over time. No mode switch episodes recorded. No ventricular arrhythmia episodes recorded. Patient bi-ventricularly pacing 100% of the time with 0.8% as VSRp. Device programmed with appropriate safety margins. Audible alerts demonstrated for patient. Estimated longevity 5.6 years. Patient will follow up via [facility] on 11/29 and with AS in 04-2016. Continue ICM follow up as scheduled.

## 2015-07-15 ENCOUNTER — Other Ambulatory Visit: Payer: Self-pay | Admitting: Family Medicine

## 2015-08-04 ENCOUNTER — Ambulatory Visit (INDEPENDENT_AMBULATORY_CARE_PROVIDER_SITE_OTHER): Payer: Medicare HMO

## 2015-08-04 ENCOUNTER — Telehealth: Payer: Self-pay | Admitting: Cardiology

## 2015-08-04 DIAGNOSIS — I429 Cardiomyopathy, unspecified: Secondary | ICD-10-CM

## 2015-08-04 DIAGNOSIS — Z95810 Presence of automatic (implantable) cardiac defibrillator: Secondary | ICD-10-CM | POA: Diagnosis not present

## 2015-08-04 DIAGNOSIS — I428 Other cardiomyopathies: Secondary | ICD-10-CM

## 2015-08-04 NOTE — Telephone Encounter (Signed)
Spoke with pt and reminded pt of remote transmission that is due today. Pt verbalized understanding.   

## 2015-08-05 ENCOUNTER — Other Ambulatory Visit: Payer: Self-pay | Admitting: Physician Assistant

## 2015-08-05 NOTE — Progress Notes (Addendum)
EPIC Encounter for ICM Monitoring  Patient Name: Kevin [name] is a 69 y.o. male Date: 08/05/2015 Primary Care Physican: [name], MD Primary Cardiologist: [name] Electrophysiologist: [name] Dry Weight: 201 lbs       In the past month, have you:  1. Gained more than 2 pounds in a day or more than 5 pounds in a week? no  2. Had changes in your medications (with verification of current medications)? no  3. Had more shortness of breath than is usual for you? no  4. Limited your activity because of shortness of breath? no  5. Not been able to sleep because of shortness of breath? no  6. Had increased swelling in your feet or ankles? no  7. Had symptoms of dehydration (dizziness, dry mouth, increased thirst, decreased urine output) no  8. Had changes in sodium restriction? no  9. Been compliant with medication? Yes   ICM trend:   Follow-up plan: ICM clinic phone appointment on 09/09/2015.  Optivol impedance below baseline ~07/25/2015 to 08/03/2015 and returned to baseline ~08/04/2015.  He denied any HF symptoms in the last week and stated he is feeling good.  He reported since his device was reprogrammed he no longer has the pain or flip flopping sensation in his chest.  Continues to follow low salt diet.  No changes today.  Copy of note sent to patient's primary care physician, primary cardiologist, and device following physician.  Rosalene Billings, RN, CCM 08/05/2015 1:31 PM   Thanks     ----- Message -----     From: Rosalene Billings, RN     Sent: 08/05/2015  1:35 PM      To: Sanda Klein, MD

## 2015-08-06 NOTE — Telephone Encounter (Signed)
Rx(s) sent to pharmacy electronically.  

## 2015-08-10 ENCOUNTER — Other Ambulatory Visit: Payer: Self-pay | Admitting: Physician Assistant

## 2015-08-11 NOTE — Telephone Encounter (Signed)
Rx(s) sent to pharmacy electronically.  

## 2015-08-13 DIAGNOSIS — Z23 Encounter for immunization: Secondary | ICD-10-CM | POA: Diagnosis not present

## 2015-08-18 ENCOUNTER — Telehealth: Payer: Self-pay | Admitting: Endocrinology

## 2015-08-18 NOTE — Telephone Encounter (Signed)
Will call and reschedule  Patient Name: Kevin [name] Gender: Male DOB: 08-17-1946 Age: 69 Y 35 M 23 D Return Phone Number: [phone] (Primary) Address: City/State/Zip: [place] Client [facility] Night - Client Client Site [facility] Physician Kumar, [place] Type Call [place] Name [place] Phone Number [phone] Relationship To Patient Self Is this call to report lab results? No Call Type General Information Initial Comment Caller states he just made an appointment for Wednesday at 9:30 AM and he needs to reschedule it. He does not wish to speak with after hours nurse, he would like a CB from office ASAP. General Information Type Appointment

## 2015-08-18 NOTE — Telephone Encounter (Signed)
Patient has been reschedule 

## 2015-08-20 ENCOUNTER — Other Ambulatory Visit: Payer: Medicare HMO

## 2015-08-21 ENCOUNTER — Other Ambulatory Visit: Payer: Medicare HMO

## 2015-08-22 ENCOUNTER — Other Ambulatory Visit (INDEPENDENT_AMBULATORY_CARE_PROVIDER_SITE_OTHER): Payer: Medicare HMO

## 2015-08-22 DIAGNOSIS — E118 Type 2 diabetes mellitus with unspecified complications: Secondary | ICD-10-CM

## 2015-08-22 DIAGNOSIS — E785 Hyperlipidemia, unspecified: Secondary | ICD-10-CM

## 2015-08-22 LAB — LIPID PANEL
CHOLESTEROL: 102 mg/dL (ref 0–200)
HDL: 30.9 mg/dL — AB (ref >39.00)
LDL CALC: 53 mg/dL (ref 0–99)
NonHDL: 71.58
Total CHOL/HDL Ratio: 3
Triglycerides: 92 mg/dL (ref 0.0–149.0)
VLDL: 18.4 mg/dL (ref 0.0–40.0)

## 2015-08-22 LAB — COMPREHENSIVE METABOLIC PANEL
ALBUMIN: 4.2 g/dL (ref 3.5–5.2)
ALT: 9 U/L (ref 0–53)
AST: 20 U/L (ref 0–37)
Alkaline Phosphatase: 66 U/L (ref 39–117)
BUN: 16 mg/dL (ref 6–23)
CHLORIDE: 102 meq/L (ref 96–112)
CO2: 28 mEq/L (ref 19–32)
Calcium: 9.3 mg/dL (ref 8.4–10.5)
Creatinine, Ser: 0.98 mg/dL (ref 0.40–1.50)
GFR: 80.43 mL/min (ref >60.00)
Glucose, Bld: 188 mg/dL — ABNORMAL HIGH (ref 70–99)
POTASSIUM: 4.4 meq/L (ref 3.5–5.1)
SODIUM: 138 meq/L (ref 135–145)
Total Bilirubin: 1 mg/dL (ref 0.2–1.2)
Total Protein: 7.4 g/dL (ref 6.0–8.3)

## 2015-08-22 LAB — MICROALBUMIN / CREATININE URINE RATIO
Creatinine,U: 46.7 mg/dL
MICROALB/CREAT RATIO: 1.5 mg/g (ref 0.0–30.0)

## 2015-08-22 LAB — HEMOGLOBIN A1C: HEMOGLOBIN A1C: 7.1 % — AB (ref 4.6–6.5)

## 2015-08-25 ENCOUNTER — Ambulatory Visit (INDEPENDENT_AMBULATORY_CARE_PROVIDER_SITE_OTHER): Payer: Medicare HMO | Admitting: Endocrinology

## 2015-08-25 ENCOUNTER — Encounter: Payer: Self-pay | Admitting: Endocrinology

## 2015-08-25 VITALS — BP 128/78 | HR 77 | Temp 98.2°F | Resp 14 | Ht 75.0 in | Wt 201.0 lb

## 2015-08-25 DIAGNOSIS — E119 Type 2 diabetes mellitus without complications: Secondary | ICD-10-CM | POA: Diagnosis not present

## 2015-08-25 NOTE — Progress Notes (Signed)
Patient ID: Kevin [name], male   DOB: [birth year], 69 y.o.   MRN: [id number]    Reason for Appointment:  Followup for Type 2 Diabetes  Referring physician:  History of Present Illness:          Diagnosis: Type 2 diabetes mellitus, date of diagnosis: 2001        Past history: His previous records are not available but initially his symptoms included weight loss He thinks he has been on metformin for several years and more recently has been taking 1000 mg twice a day [facility] His A1c was 6.9 in 2013 but a year later was 8.1 He had been on glipizide 5 mg twice a day in addition to his metformin 1 g twice a day   Prior to his consultation when his A1c was 10% and he was given Januvia in addition  Recent history:  He is taking Janumet XR 100/1000 daily without any side effects and issues with reimbursement Also continues to take 5 mg glipizide ER without any tendency to hypoglycemia  He says he is using his Accu-Chek meter regularly but again did not bring his monitor for download He is not aware of what his blood sugar targets should be especially after meals His glucose in the lab was 188 but he thinks he may have had juice that morning  His A1c has stayed around 7% consistently  Has been exercising at least 3 times a week, mostly with walking Hypoglycemia: None recently       Oral hypoglycemic drugs the patient is taking are: Glipizide ER 5 mg qd, Janumet XR 100/1000 daily Side effects from medications have been: None  Glucose monitoring:  done 1-2 times a day         Glucometer:  Accu-Chek compact     Blood Glucose readings  by recall usually about 90-120 in the morning.  PC 140-160   Glycemic control:    Lab Results  Component Value Date   HGBA1C 7.1* 08/22/2015   HGBA1C 7.0* 04/18/2015   HGBA1C 7.1* 09/24/2014   Lab Results  Component Value Date   MICROALBUR <0.7 08/22/2015   LDLCALC 53 08/22/2015   CREATININE 0.98 08/22/2015     Self-care: The diet that the patient has been following is: tries to limit portions, may get fried food when eating out, some fast food He is drinking juice periodically especially cranberry juice for his "kidneys"  Meals: 3 meals per day.  breakfast is at 7-8 am and is usually eating a biscuit or special K. cereal Usually eating baked chicken or chicken salad sandwiches for meals and will have fruits for snacks          Exercise:  30-40 minutes, 3-4 days a week on treadmill/stretching         Dietician visit: Most recent:2001,              Compliance with the medical regimen: Good  Retinal exam: Most recent:. 8/16 at [facility] hospital.   Weight history:  Wt Readings from Last 3 Encounters:  08/25/15 201 lb (91.173 kg)  06/09/15 203 lb (92.08 kg)  04/24/15 200 lb 12.8 oz (91.082 kg)      Medication List       This list is accurate as of: 08/25/15  9:52 AM.  Always use your most recent med list.               ACCU-CHEK COMPACT  PLUS test strip  Generic drug:  glucose blood  1 each by Other route as needed.     aspirin 81 MG tablet  Take 81 mg by mouth daily.     atorvastatin 80 MG tablet  Commonly known as:  LIPITOR  Take 40 mg by mouth daily at 6 PM.     buPROPion 150 MG 24 hr tablet  Commonly known as:  WELLBUTRIN XL  Take 150 mg by mouth daily.     carvedilol 12.5 MG tablet  Commonly known as:  COREG  TAKE ONE TABLET BY MOUTH TWICE DAILY WITH A MEAL     clobetasol ointment 0.05 %  Commonly known as:  TEMOVATE  Apply 1 application topically daily as needed (itching).     CoQ-10 200 MG Caps  Take by mouth.     FLUZONE HIGH-DOSE 0.5 ML Susy  Generic drug:  Influenza Vac Split High-Dose  ADM 0.5ML IM UTD     furosemide 40 MG tablet  Commonly known as:  LASIX  Take 40 mg by mouth daily.     furosemide 40 MG tablet  Commonly known as:  LASIX  TAKE ONE TABLET BY MOUTH EVERY DAY WITH BREAKFAST     glipiZIDE 5 MG 24 hr tablet    Commonly known as:  GLUCOTROL XL  TAKE 1 TABLET BY MOUTH EVERY DAY WITH BREAKFAST     latanoprost 0.005 % ophthalmic solution  Commonly known as:  XALATAN  Place 1 drop into the right eye daily.     LORazepam 0.5 MG tablet  Commonly known as:  ATIVAN  Take 0.5 mg by mouth daily as needed for anxiety.     losartan 100 MG tablet  Commonly known as:  COZAAR  Take 50 mg by mouth daily.     SitaGLIPtin-MetFORMIN HCl [phone] MG Tb24  Take 1 tablet by mouth daily.     spironolactone 25 MG tablet  Commonly known as:  ALDACTONE  take 1/2 tablet BY MOUTH EVERY DAY     VIAGRA 100 MG tablet  Generic drug:  sildenafil  TAKE ONE TABLET BY MOUTH AS NEEDED FOR ERECTILE DYSFUNCTION.        Allergies:  Allergies  Allergen Reactions  . Sulfa Antibiotics Swelling    Swelling of lips only.    Past Medical History  Diagnosis Date  . Anemia   . Nonischemic cardiomyopathy ([place])     a. MDT CRTD upgrade 2016  . History of colon polyps   . LBBB (left bundle branch block)   . Heart block AV second degree     a. s/p PPM implant with subsequent CRTD upgrade  . Prostate cancer ([place])     "not been treated yet; on a wait and see" (01/22/2015)  . Hypertension   . High cholesterol   . Type II diabetes mellitus ([place])   . Agent orange exposure     in [place]    Past Surgical History  Procedure Laterality Date  . Inguinal hernia repair Left 1980  . Tonsillectomy    . Shrapnel surgery      "in [place]"  . Examination under anesthesia  04/06/2012    Procedure: EXAM UNDER ANESTHESIA;  Surgeon: Marcello Moores A. Cornett, MD;  Location: [facility];  Service: General;  Laterality: N/A;  . Nm myocar perf wall motion  01/21/2012    mod-severe perfusion defect due to infarct/scar w/mild perinfarct ischemia in the apical,basal inferoseptal,basal inferior,mid inferoseptal,midinferior and apical inferior regions  . Cardiac catheterization  01/27/2012    normal coronaries, dilated LV c/w nonischemic cardiomyopathy   . Permanent pacemaker insertion N/A 12/01/2011    Medtronic implanted by Dr Sallyanne Kuster  . Left heart catheterization with coronary angiogram N/A 01/27/2012    Procedure: LEFT HEART CATHETERIZATION WITH CORONARY ANGIOGRAM;  Surgeon: Troy Sine, MD;  Location: [facility] CATH LAB;  Service: Cardiovascular;  Laterality: N/A;  . Incision and drainage perirectal abscess  ~ 2010; 2015  . Basal cell carcinoma excision      "back, face, neck"  . Bi-ventricular implantable cardioverter defibrillator upgrade N/A 01/22/2015    MDT CRTD upgrade by Dr Rayann Heman    Family History  Problem Relation Age of Onset  . Ovarian cancer Mother   . Leukemia Father   . Heart disease Father   . Diabetes Neg Hx     Social History:  reports that he quit smoking about 31 years ago. His smoking use included Cigarettes. He has a 20 pack-year smoking history. He has never used smokeless tobacco. He reports that he drinks alcohol. He reports that he does not use illicit drugs.    Review of Systems   He has [facility] exams every 6 months [facility] but no records available      Lipids: He has been treated with Lipitor 80 mg        Lab Results  Component Value Date   CHOL 102 08/22/2015   HDL 30.90* 08/22/2015   LDLCALC 53 08/22/2015   LDLDIRECT 58.6 09/24/2014   TRIG 92.0 08/22/2015   CHOLHDL 3 08/22/2015      Hypertension: Mild and well controlled with losartan and carvedilol  Also taking Lasix and followed by cardiologist      Physical Examination:  BP 128/78 mmHg  Pulse 77  Temp(Src) 98.2 F (36.8 C)  Resp 14  Ht 6\' 3"  (1.905 m)  Wt 201 lb (91.173 kg)  BMI 25.12 kg/m2  SpO2 96%   ASSESSMENT:  Diabetes type 2, uncontrolled    Patient has overall stable blood sugar control with using Janumet XR and glipizide ER His A1c is stable around 7% His lab glucose was higher but he thinks it was from drinking juice Overall he thinks his sugars out of control even after meals but not clear  how often he is monitoring Does not bring her monitor for download usually  He has some concerns about the cost of Janumet, may be in the doughnut hole  HYPERLIPIDEMIA:  well controlled except for low HDL  PLAN:   Continue same medications including Janumet XR and glipizide ER, consider alternatives to Janumet if covered by insurance, he will call to verify  Continue exercise regimen  Avoid drinking juice without a meal  Alternate fasting and postprandial blood sugars  Bring blood sugar monitor for download on each visit   Julian Medina 08/25/2015, 9:52 AM     Note: This office note was prepared with Estate agent. Any transcriptional errors that result from this process are unintentional.

## 2015-08-25 NOTE — Patient Instructions (Signed)
Check blood sugars on waking up ..3  .. times a week Also check blood sugars about 2 hours after a meal and do this after different meals by rotation  Recommended blood sugar levels on waking up is 90-120 and about 2 hours after meal is 120-160 Please bring blood sugar monitor to each visit.  Check cost of alternatives to Janumet  Avoid juice without a meal

## 2015-09-05 DIAGNOSIS — R972 Elevated prostate specific antigen [PSA]: Secondary | ICD-10-CM | POA: Diagnosis not present

## 2015-09-09 ENCOUNTER — Telehealth: Payer: Self-pay | Admitting: Cardiology

## 2015-09-09 ENCOUNTER — Ambulatory Visit (INDEPENDENT_AMBULATORY_CARE_PROVIDER_SITE_OTHER): Payer: Medicare HMO

## 2015-09-09 DIAGNOSIS — Z0389 Encounter for observation for other suspected diseases and conditions ruled out: Secondary | ICD-10-CM

## 2015-09-09 DIAGNOSIS — Z4502 Encounter for adjustment and management of automatic implantable cardiac defibrillator: Secondary | ICD-10-CM

## 2015-09-09 NOTE — Telephone Encounter (Signed)
Confirmed remote transmission w/ pt wife.   

## 2015-09-10 ENCOUNTER — Telehealth: Payer: Self-pay

## 2015-09-10 NOTE — Progress Notes (Addendum)
EPIC Encounter for ICM Monitoring  Patient Name: Kevin [name] is a 69 y.o. male Date: 09/10/2015 Primary Care Physican: Kevin Post, MD Primary Cardiologist: Kevin [name] Electrophysiologist: Kevin [name] Dry Weight: 203 lb       In the past month, have you:  1. Gained more than 2 pounds in a day or more than 5 pounds in a week? no  2. Had changes in your medications (with verification of current medications)? no  3. Had more shortness of breath than is usual for you? no  4. Limited your activity because of shortness of breath? no  5. Not been able to sleep because of shortness of breath? no  6. Had increased swelling in your feet or ankles? no  7. Had symptoms of dehydration (dizziness, dry mouth, increased thirst, decreased urine output) no  8. Had changes in sodium restriction? no  9. Been compliant with medication? Yes   ICM trend: 09/09/2015   Follow-up plan: ICM clinic phone appointment 10/14/2015.  Optivol trending along baseline.  Patient denied any HF symptoms and doing well.  He asked about getting script for Lorazepam and advised to call PCP to discuss.  No changes today.   Copy of note sent to patient's primary care physician, primary cardiologist, and device following physician.  Kevin Billings, RN, CCM 09/10/2015 11:16 AM    Thank you    ----- Message -----     From: Kevin Billings, RN     Sent: 09/10/2015 11:26 AM      To: Kevin Klein, MD

## 2015-09-16 DIAGNOSIS — C61 Malignant neoplasm of prostate: Secondary | ICD-10-CM | POA: Diagnosis not present

## 2015-09-18 NOTE — Telephone Encounter (Signed)
Error, no notes.

## 2015-10-10 ENCOUNTER — Other Ambulatory Visit: Payer: Self-pay | Admitting: Endocrinology

## 2015-10-14 ENCOUNTER — Telehealth: Payer: Self-pay | Admitting: Cardiology

## 2015-10-14 ENCOUNTER — Ambulatory Visit (INDEPENDENT_AMBULATORY_CARE_PROVIDER_SITE_OTHER): Payer: Medicare HMO | Admitting: *Deleted

## 2015-10-14 DIAGNOSIS — Z95810 Presence of automatic (implantable) cardiac defibrillator: Secondary | ICD-10-CM

## 2015-10-14 DIAGNOSIS — I428 Other cardiomyopathies: Secondary | ICD-10-CM

## 2015-10-14 DIAGNOSIS — I429 Cardiomyopathy, unspecified: Secondary | ICD-10-CM

## 2015-10-14 NOTE — Progress Notes (Signed)
Remote ICD transmission.   

## 2015-10-14 NOTE — Telephone Encounter (Signed)
Spoke with pt and reminded pt of remote transmission that is due today. Pt verbalized understanding.   

## 2015-10-16 NOTE — Progress Notes (Signed)
EPIC Encounter for ICM Monitoring  Patient Name: Kevin [name] is a 69 y.o. male Date: 10/16/2015 Primary Care Physican: [name], MD Primary Cardiologist: [name] Electrophysiologist: [name] Dry Weight: 198 lb       In the past month, have you:  1. Gained more than 2 pounds in a day or more than 5 pounds in a week? no  2. Had changes in your medications (with verification of current medications)? no  3. Had more shortness of breath than is usual for you? no  4. Limited your activity because of shortness of breath? no  5. Not been able to sleep because of shortness of breath? no  6. Had increased swelling in your feet or ankles? no  7. Had symptoms of dehydration (dizziness, dry mouth, increased thirst, decreased urine output) no  8. Had changes in sodium restriction? no  9. Been compliant with medication? Yes   ICM trend:  10/14/2015   Follow-up plan: ICM clinic phone appointment 11/18/2015.  Optivol thoracic impedance below baseline ~09/30/2015 to 10/11/2015 suggesting fluid and returned to baseline 10/12/2015.  He denied any HF symptoms and feeling good.  No changes today.  Copy of note sent to patient's primary care physician, primary cardiologist, and device following physician.  Rosalene Billings, RN, CCM 10/16/2015 9:15 AM

## 2015-10-22 LAB — CUP PACEART REMOTE DEVICE CHECK
Battery Remaining Longevity: 57 mo
Brady Statistic AP VS Percent: 0.01 %
Brady Statistic AS VS Percent: 0.16 %
HighPow Impedance: 75 Ohm
Implantable Lead Implant Date: 20130123
Implantable Lead Location: 753860
Implantable Lead Model: 4598
Implantable Lead Model: 6935
Lead Channel Impedance Value: 1045 Ohm
Lead Channel Impedance Value: 1064 Ohm
Lead Channel Impedance Value: 1140 Ohm
Lead Channel Impedance Value: 418 Ohm
Lead Channel Impedance Value: 418 Ohm
Lead Channel Impedance Value: 551 Ohm
Lead Channel Impedance Value: 551 Ohm
Lead Channel Impedance Value: 684 Ohm
Lead Channel Impedance Value: 703 Ohm
Lead Channel Pacing Threshold Amplitude: 0.625 V
Lead Channel Pacing Threshold Amplitude: 3.25 V
Lead Channel Pacing Threshold Pulse Width: 0.4 ms
Lead Channel Pacing Threshold Pulse Width: 1 ms
Lead Channel Sensing Intrinsic Amplitude: 10.125 mV
Lead Channel Sensing Intrinsic Amplitude: 4.125 mV
Lead Channel Setting Pacing Amplitude: 2 V
Lead Channel Setting Pacing Amplitude: 4 V
Lead Channel Setting Pacing Pulse Width: 0.4 ms
Lead Channel Setting Sensing Sensitivity: 0.3 mV
MDC IDC LEAD IMPLANT DT: 20160316
MDC IDC LEAD IMPLANT DT: 20160316
MDC IDC LEAD LOCATION: 753858
MDC IDC LEAD LOCATION: 753859
MDC IDC MSMT BATTERY VOLTAGE: 2.97 V
MDC IDC MSMT LEADCHNL LV IMPEDANCE VALUE: 551 Ohm
MDC IDC MSMT LEADCHNL LV IMPEDANCE VALUE: 836 Ohm
MDC IDC MSMT LEADCHNL LV IMPEDANCE VALUE: 855 Ohm
MDC IDC MSMT LEADCHNL RA SENSING INTR AMPL: 4.125 mV
MDC IDC MSMT LEADCHNL RV IMPEDANCE VALUE: 475 Ohm
MDC IDC MSMT LEADCHNL RV PACING THRESHOLD AMPLITUDE: 0.5 V
MDC IDC MSMT LEADCHNL RV PACING THRESHOLD PULSEWIDTH: 0.4 ms
MDC IDC MSMT LEADCHNL RV SENSING INTR AMPL: 10.125 mV
MDC IDC SESS DTM: 20161206202205
MDC IDC SET LEADCHNL LV PACING PULSEWIDTH: 1 ms
MDC IDC SET LEADCHNL RV PACING AMPLITUDE: 2.5 V
MDC IDC STAT BRADY AP VP PERCENT: 0.03 %
MDC IDC STAT BRADY AS VP PERCENT: 99.81 %
MDC IDC STAT BRADY RA PERCENT PACED: 0.04 %
MDC IDC STAT BRADY RV PERCENT PACED: 92.79 %

## 2015-10-29 ENCOUNTER — Encounter: Payer: Self-pay | Admitting: Cardiology

## 2015-11-04 ENCOUNTER — Encounter: Payer: Self-pay | Admitting: *Deleted

## 2015-11-13 ENCOUNTER — Encounter: Payer: Self-pay | Admitting: Cardiology

## 2015-11-18 ENCOUNTER — Ambulatory Visit (INDEPENDENT_AMBULATORY_CARE_PROVIDER_SITE_OTHER): Payer: Medicare HMO

## 2015-11-18 DIAGNOSIS — I5022 Chronic systolic (congestive) heart failure: Secondary | ICD-10-CM | POA: Diagnosis not present

## 2015-11-18 DIAGNOSIS — Z95810 Presence of automatic (implantable) cardiac defibrillator: Secondary | ICD-10-CM | POA: Diagnosis not present

## 2015-11-20 ENCOUNTER — Telehealth: Payer: Self-pay

## 2015-11-20 NOTE — Progress Notes (Signed)
EPIC Encounter for ICM Monitoring  Patient Name: Kevin [name] is a 70 y.o. male Date: 11/20/2015 Primary Care Physican: [name], MD Primary Cardiologist: [name] Electrophysiologist: [name] Dry Weight: 194 lbs  Bi-V Pacing 100%       In the past month, have you:  1. Gained more than 2 pounds in a day or more than 5 pounds in a week? no  2. Had changes in your medications (with verification of current medications)? no  3. Had more shortness of breath than is usual for you? no  4. Limited your activity because of shortness of breath? no  5. Not been able to sleep because of shortness of breath? no  6. Had increased swelling in your feet or ankles? no  7. Had symptoms of dehydration (dizziness, dry mouth, increased thirst, decreased urine output) no  8. Had changes in sodium restriction? no  9. Been compliant with medication? Yes   ICM trend: 3 month view   ICM trend: 1 year view   Follow-up plan: ICM clinic phone appointment 12/23/2015.  Optivol daily thoracic impedance below reference line 11/13/2015 to 11/16/2015 suggesting fluid.  He denied any HF symptoms but he has eaten some foods higher in sodium.  He stated he is doing fine at this time.  No changes today.  Encouraged to call should he experience any HF symptoms.   Copy of note sent to patient's primary care physician, primary cardiologist, and device following physician.  Rosalene Billings, RN, CCM 11/20/2015 3:26 PM

## 2015-11-20 NOTE — Telephone Encounter (Signed)
Remote ICM transmission received.  Attempted patient call and left message for return call.   

## 2015-11-25 DIAGNOSIS — C61 Malignant neoplasm of prostate: Secondary | ICD-10-CM | POA: Diagnosis not present

## 2015-11-28 NOTE — Telephone Encounter (Signed)
Spoke with patient on 11/20/2015.

## 2015-12-10 ENCOUNTER — Encounter: Payer: Self-pay | Admitting: *Deleted

## 2015-12-23 ENCOUNTER — Telehealth: Payer: Self-pay | Admitting: Cardiology

## 2015-12-23 ENCOUNTER — Ambulatory Visit (INDEPENDENT_AMBULATORY_CARE_PROVIDER_SITE_OTHER): Payer: Medicare HMO

## 2015-12-23 DIAGNOSIS — Z95810 Presence of automatic (implantable) cardiac defibrillator: Secondary | ICD-10-CM | POA: Diagnosis not present

## 2015-12-23 DIAGNOSIS — I5022 Chronic systolic (congestive) heart failure: Secondary | ICD-10-CM

## 2015-12-23 NOTE — Telephone Encounter (Signed)
LMOVM reminding pt to send remote transmission.   

## 2015-12-25 ENCOUNTER — Telehealth: Payer: Self-pay

## 2015-12-25 NOTE — Progress Notes (Signed)
EPIC Encounter for ICM Monitoring  Patient Name: Kevin [name] is a 70 y.o. male Date: 12/25/2015 Primary Care Physican: [name], MD Primary Cardiologist: [name] Electrophysiologist: [name] Dry Weight: 194 lbs  Bi-V Pacing 100%       In the past month, have you:  1. Gained more than 2 pounds in a day or more than 5 pounds in a week? no  2. Had changes in your medications (with verification of current medications)? no  3. Had more shortness of breath than is usual for you? no  4. Limited your activity because of shortness of breath? no  5. Not been able to sleep because of shortness of breath? no  6. Had increased swelling in your feet or ankles? no  7. Had symptoms of dehydration (dizziness, dry mouth, increased thirst, decreased urine output) no  8. Had changes in sodium restriction? no  9. Been compliant with medication? Yes   ICM trend: 3 month view    ICM trend: 1 year view   Follow-up plan: ICM clinic phone appointment on 01/28/2016.  Optivol thoracic impedance trending along reference line since 01/05/2016 suggesting fluid levels are stable.  Thoracic impedance below reference line 11/28/2015 to 12/04/2015 suggesting fluid retention.  He denied any fluid symptoms.  He stated he is feeling fine at this time.  Encouraged to call should he have any fluid symptoms.  No changes today.   Copy of note sent to patient's primary care physician, primary cardiologist, and device following physician.  Rosalene Billings, RN, CCM 12/25/2015 2:22 PM

## 2015-12-25 NOTE — Telephone Encounter (Signed)
Spoke with patient.

## 2015-12-25 NOTE — Telephone Encounter (Signed)
Remote ICM transmission received.  Attempted patient call and he requested to be called back

## 2016-01-05 ENCOUNTER — Other Ambulatory Visit: Payer: Self-pay | Admitting: *Deleted

## 2016-01-05 DIAGNOSIS — C61 Malignant neoplasm of prostate: Secondary | ICD-10-CM | POA: Diagnosis not present

## 2016-01-05 DIAGNOSIS — Z0000 Encounter for general adult medical examination without abnormal findings: Secondary | ICD-10-CM | POA: Diagnosis not present

## 2016-01-05 MED ORDER — SITAGLIP PHOS-METFORMIN HCL ER 100-1000 MG PO TB24: 1.0000 | ORAL_TABLET | Freq: Every day | ORAL | Status: DC

## 2016-01-06 ENCOUNTER — Other Ambulatory Visit: Payer: Self-pay | Admitting: *Deleted

## 2016-01-06 ENCOUNTER — Telehealth: Payer: Self-pay | Admitting: Internal Medicine

## 2016-01-06 MED ORDER — SITAGLIP PHOS-METFORMIN HCL ER 100-1000 MG PO TB24: 1.0000 | ORAL_TABLET | Freq: Every day | ORAL | Status: DC

## 2016-01-06 NOTE — Telephone Encounter (Signed)
New message       1. Has your device fired? Yes----this am 2. Is you device beeping? no  3. Are you experiencing draining or swelling at device site? no  4. Are you calling to see if we received your device transmission? Yes, pt also sent in a transmission this am  5. Have you passed out? No

## 2016-01-06 NOTE — Telephone Encounter (Signed)
Patient called in believing that he received an ICD shock this morning. He said that he felt a "little shock" lasting about 4-5 seconds this morning while in the bathroom shaving. He said that some chest  tightness still remains. Patient denies any radiation to any other parts of his body.Patient also denies ShOB, dizziness, or syncope. Patient has not taken any medications for relief.  Remote was reviewed and shows that patient has not had any episodes.   I reviewed remote findings with patient and recommended that he go to ER for further evaluation. Patient voiced reluctance saying that this is the first time he has felt this sensation. Again, I told him that he should have this evaluated in the ER. Patient declined at present saying that he would prefer to sit down and eat his breakfast and if it does not get better then he would go to the ER.  Will inform Dr.[name] about patient c/o and notify patient if anything further is recommended.   Patient voiced understanding.

## 2016-01-15 ENCOUNTER — Ambulatory Visit: Payer: Medicare HMO | Admitting: Radiation Oncology

## 2016-01-19 ENCOUNTER — Ambulatory Visit
Admission: RE | Admit: 2016-01-19 | Discharge: 2016-01-19 | Disposition: A | Discharge status: Home / Self Care | Payer: Medicare HMO | Source: Ambulatory Visit | Attending: Radiation Oncology | Admitting: Radiation Oncology

## 2016-01-19 ENCOUNTER — Ambulatory Visit: Payer: Medicare HMO

## 2016-01-19 ENCOUNTER — Telehealth: Payer: Self-pay | Admitting: *Deleted

## 2016-01-19 ENCOUNTER — Encounter: Payer: Self-pay | Admitting: Radiation Oncology

## 2016-01-19 VITALS — BP 122/74 | HR 91 | Resp 16 | Ht 75.0 in | Wt 200.0 lb

## 2016-01-19 DIAGNOSIS — C61 Malignant neoplasm of prostate: Secondary | ICD-10-CM | POA: Insufficient documentation

## 2016-01-19 DIAGNOSIS — Z86010 Personal history of colonic polyps: Secondary | ICD-10-CM | POA: Insufficient documentation

## 2016-01-19 DIAGNOSIS — I10 Essential (primary) hypertension: Secondary | ICD-10-CM | POA: Insufficient documentation

## 2016-01-19 DIAGNOSIS — D649 Anemia, unspecified: Secondary | ICD-10-CM | POA: Diagnosis not present

## 2016-01-19 DIAGNOSIS — Z87891 Personal history of nicotine dependence: Secondary | ICD-10-CM | POA: Insufficient documentation

## 2016-01-19 DIAGNOSIS — E7800 Pure hypercholesterolemia, unspecified: Secondary | ICD-10-CM | POA: Diagnosis not present

## 2016-01-19 DIAGNOSIS — I447 Left bundle-branch block, unspecified: Secondary | ICD-10-CM | POA: Diagnosis not present

## 2016-01-19 DIAGNOSIS — I428 Other cardiomyopathies: Secondary | ICD-10-CM | POA: Diagnosis not present

## 2016-01-19 NOTE — Progress Notes (Signed)
GU Location of Tumor / Histology: prostatic adenocarcinoma  If Prostate Cancer, Gleason Score is (3 + 4) and PSA is (4.86) on 08/2015  BETTE FROMAN presented May of 2004 with an elevated PSA of 2.15.  Second prostate biopsy revealed:   Past/Anticipated interventions by urology, if any: prostate biopsy, active surveillance, prostate biopsy (progression noted), referral to radiation oncology   Past/Anticipated interventions by medical oncology, if any: no  Weight changes, if any: no  Bowel/Bladder complaints, if any: Reports intermittent frequency. Reports infrequent intermittency, urgency, and weak stream. Denies nocturia, hematuria, dysuria, leakage or incontinence.  Nausea/Vomiting, if any: no  Pain issues, if any:  no  SAFETY ISSUES:  Prior radiation? no  Pacemaker/ICD? YES  Possible current pregnancy? no  Is the patient on methotrexate? no  Current Complaints / other details:  70 year old male. Separated. Works for [employer]. Prostate volume 81.36 cc. Patient interested in brachytherapy.

## 2016-01-19 NOTE — Progress Notes (Signed)
Radiation Oncology         [phone] ________________________________  Initial outpatient Consultation  Name: Kevin [name] MRN: [id number]  Date: 01/19/2016  DOB: 1946/06/11  [id number] W, MD  Ardis Hughs, MD   REFERRING PHYSICIAN: Ardis Hughs, MD  DIAGNOSIS: 70 y.o. gentleman with stage T1c adenocarcinoma of the prostate with a Gleason's score of 3+4 and a PSA of 4.86    ICD-9-CM ICD-10-CM   1. Malignant neoplasm of prostate ([place]) Kevin [name] is a 70 y.o. gentleman who was initially diagnosed with prostate cancer on 09/2013. At that time he had Gleason's 3+3 and 5% of one core out of 12. His urologist then, Dr. Jasmine December, and he decided on active surveillance.   He was noted to have an elevated PSA of 4.86 by his primary care physician, Dr. Louis Meckel.  Accordingly, he underwent a repeat transrectal ultrasound with 12 biopsies of the prostate on 11/25/15.  The prostate volume measured 81.36 cc.  Out of 12 core biopsies, 2 were positive.  The maximum Gleason score was 3+4, and this was seen in the right mid and right mid lateral.  The patient reviewed the biopsy results with his urologist and he has kindly been referred today for discussion of potential radiation treatment options.    PREVIOUS RADIATION THERAPY: No  PAST MEDICAL HISTORY:  has a past medical history of Anemia; Nonischemic cardiomyopathy ([place]); History of colon polyps; LBBB (left bundle branch block); Heart block AV second degree; Prostate cancer ([place]); Hypertension; High cholesterol; Type II diabetes mellitus ([place]); and Agent orange exposure.    PAST SURGICAL HISTORY: Past Surgical History  Procedure Laterality Date  . Inguinal hernia repair Left 1980  . Tonsillectomy    . Shrapnel surgery      "in [place]"  . Examination under anesthesia  04/06/2012    Procedure: EXAM UNDER ANESTHESIA;  Surgeon: Marcello Moores A. Cornett, MD;  Location: [facility];   Service: General;  Laterality: N/A;  . Nm myocar perf wall motion  01/21/2012    mod-severe perfusion defect due to infarct/scar w/mild perinfarct ischemia in the apical,basal inferoseptal,basal inferior,mid inferoseptal,midinferior and apical inferior regions  . Cardiac catheterization  01/27/2012    normal coronaries, dilated LV c/w nonischemic cardiomyopathy  . Permanent pacemaker insertion N/A 12/01/2011    Medtronic implanted by Dr Sallyanne Kuster  . Left heart catheterization with coronary angiogram N/A 01/27/2012    Procedure: LEFT HEART CATHETERIZATION WITH CORONARY ANGIOGRAM;  Surgeon: Troy Sine, MD;  Location: [facility] CATH LAB;  Service: Cardiovascular;  Laterality: N/A;  . Incision and drainage perirectal abscess  ~ 2010; 2015  . Basal cell carcinoma excision      "back, face, neck"  . Bi-ventricular implantable cardioverter defibrillator upgrade N/A 01/22/2015    MDT CRTD upgrade by Dr Rayann Heman  . Prostate biopsy    . Prostate biopsy      FAMILY HISTORY: family history includes Heart disease in his father; Leukemia in his father; Ovarian cancer in his mother. There is no history of Diabetes.  SOCIAL HISTORY:  reports that he quit smoking about 31 years ago. His smoking use included Cigarettes. He has a 20 pack-year smoking history. He has never used smokeless tobacco. He reports that he drinks alcohol. He reports that he does not use illicit drugs.  ALLERGIES: Sulfa antibiotics  MEDICATIONS:  Current Outpatient Prescriptions  Medication Sig Dispense Refill  . ACCU-CHEK COMPACT PLUS test strip 1 each by  Other route as needed. 100 each 0  . aspirin 81 MG tablet Take 81 mg by mouth daily.      Marland Kitchen atorvastatin (LIPITOR) 80 MG tablet Take 40 mg by mouth daily at 6 PM.    . buPROPion (WELLBUTRIN XL) 150 MG 24 hr tablet Take 150 mg by mouth daily.    . carvedilol (COREG) 12.5 MG tablet TAKE ONE TABLET BY MOUTH TWICE DAILY WITH A MEAL 180 tablet 0  . clobetasol ointment (TEMOVATE) [id number] % Apply 1  application topically daily as needed (itching).     . Coenzyme Q10 (COQ-10) 200 MG CAPS Take by mouth.    Marland Kitchen FLUZONE HIGH-DOSE 0.5 ML SUSY ADM 0.5ML IM UTD  0  . furosemide (LASIX) 40 MG tablet Take 40 mg by mouth daily.    . furosemide (LASIX) 40 MG tablet TAKE ONE TABLET BY MOUTH EVERY DAY WITH BREAKFAST 90 tablet 0  . glipiZIDE (GLUCOTROL XL) 5 MG 24 hr tablet TAKE 1 TABLET BY MOUTH EVERY DAY WITH BREAKFAST 30 tablet 3  . latanoprost (XALATAN) 0.005 % ophthalmic solution Place 1 drop into the right eye daily.     Marland Kitchen losartan (COZAAR) 100 MG tablet Take 50 mg by mouth daily.    . SitaGLIPtin-MetFORMIN HCl 706 [id number] MG TB24 Take 1 tablet by mouth daily. 30 tablet 1  . spironolactone (ALDACTONE) 25 MG tablet take 1/2 tablet BY MOUTH EVERY DAY 45 tablet 0  . VIAGRA 100 MG tablet TAKE ONE TABLET BY MOUTH AS NEEDED FOR ERECTILE DYSFUNCTION. 10 tablet 3   No current facility-administered medications for this encounter.    REVIEW OF SYSTEMS:  A 15 point review of systems is documented in the electronic medical record. This was obtained by the nursing staff. However, I reviewed this with the patient to discuss relevant findings and make appropriate changes.  Pertinent items noted in HPI and remainder of comprehensive ROS otherwise negative..  The patient completed an IPSS and IIEF questionnaire.  His IPSS score was 5 indicating mild urinary outflow obstructive symptoms.  He indicated that his erectile function is able to complete sexual activity.  70 year old male. Separated. Patient interested in brachytherapy. Patient has a pacemaker. Agent Orange exposure.  Reports intermittent frequency. Reports infrequent intermittency, urgency, and weak stream. Denies nocturia, hematuria, dysuria, leakage or incontinence. Denies nausea, vomiting, pain, or weight changes. Prior fistula with surgical intervention, it has recurred but does not cause discomfort, just intermittent bleeding.     PHYSICAL EXAM: This  patient is in no acute distress.  He is alert and oriented.   height is 6\' 3"  (1.905 m) and weight is 200 lb (90.719 kg). His blood pressure is 122/74 and his pulse is 91. His respiration is 16 and oxygen saturation is 100%.  He exhibits no respiratory distress or labored breathing.  He appears neurologically intact.  His mood is pleasant.  His affect is appropriate.  Please note the digital rectal exam findings described above.  KPS = 100  100 - Normal; no complaints; no evidence of disease. 90   - Able to carry on normal activity; minor signs or symptoms of disease. 80   - Normal activity with effort; some signs or symptoms of disease. 69   - Cares for self; unable to carry on normal activity or to do active work. 60   - Requires occasional assistance, but is able to care for most of his personal needs. 50   - Requires considerable assistance and frequent medical  care. 17   - Disabled; requires special care and assistance. 44   - Severely disabled; hospital admission is indicated although death not imminent. 61   - Very sick; hospital admission necessary; active supportive treatment necessary. 10   - Moribund; fatal processes progressing rapidly. 0     - Dead  Karnofsky DA, Abelmann [place], Craver LS and Burchenal [facility] [phone]) The use of the nitrogen mustards in the palliative treatment of carcinoma: with particular reference to bronchogenic carcinoma Cancer 1 [id number]   LABORATORY DATA:  Lab Results  Component Value Date   WBC 6.1 01/15/2015   HGB 14.2 01/15/2015   HCT 41.7 01/15/2015   MCV 95.9 01/15/2015   PLT 147.0* 01/15/2015   Lab Results  Component Value Date   NA 138 08/22/2015   K 4.4 08/22/2015   CL 102 08/22/2015   CO2 28 08/22/2015   Lab Results  Component Value Date   ALT 9 08/22/2015   AST 20 08/22/2015   ALKPHOS 66 08/22/2015   BILITOT 1.0 08/22/2015     RADIOGRAPHY: No results found.    IMPRESSION: This gentleman is a pleasant 70 year-old with stage T1c  adenocarcinoma of the prostate with a Gleason's score of 3+4 and a PSA of 4.86.  His T-Stage, Gleason's Score, and PSA put him into the intermediate risk group.  Accordingly he is eligible for a variety of potential treatment options including external beam radiation or prostate seed implant with androgen deprivation therapy.  PLAN: Today I reviewed the findings and workup thus far.  We discussed the natural history of prostate cancer.  We reviewed the the implications of T-stage, Gleason's Score, and PSA on decision-making and outcomes in prostate cancer.  We discussed radiation treatment in the management of prostate cancer with regard to the logistics and delivery of external beam radiation treatment as well as the logistics and delivery of prostate brachytherapy.  We compared and contrasted each of these approaches and also compared these against prostatectomy.  The patient expressed interest in external beam radiotherapy.  I filled out a patient counseling form for him with relevant treatment diagrams and we retained a copy for our records.   The patient would like to proceed with prostate IMRT.  I will share my findings with Dr. Louis Meckel and move forward with scheduling placement of three gold fiducial markers into the prostate to proceed with IMRT in the near future.     I enjoyed meeting with him today, and will look forward to participating in the care of this very nice gentleman.   I spent time face to face with the patient and more than 50% of that time was spent in counseling and/or coordination of care.   ------------------------------------------------  Sheral Apley. Tammi Klippel, M.D.      This document serves as a record of services personally performed by Tyler Pita, MD. It was created on his behalf by Arlyce Harman, a trained medical scribe. The creation of this record is based on the scribe's personal observations and the provider's statements to them. This document has been  checked and approved by the attending provider.

## 2016-01-19 NOTE — Telephone Encounter (Signed)
CALLED PATIENT TO INFORM OF APPT. FOR GOLD SEEDS ON 02-10-16 - ARRIVAL TIME - 10:45 AM @ DR. HERRICK'S OFFICE AND HIS SIM ON 02-13-16 @ 2 PM @ DR. MANNING'S OFFICE, LVM FOR A RETURN CALL

## 2016-01-19 NOTE — Progress Notes (Signed)
See progress note under physician encounter. 

## 2016-01-28 ENCOUNTER — Ambulatory Visit (INDEPENDENT_AMBULATORY_CARE_PROVIDER_SITE_OTHER): Payer: Medicare HMO | Admitting: *Deleted

## 2016-01-28 ENCOUNTER — Telehealth: Payer: Self-pay | Admitting: Cardiology

## 2016-01-28 DIAGNOSIS — I428 Other cardiomyopathies: Secondary | ICD-10-CM

## 2016-01-28 DIAGNOSIS — I429 Cardiomyopathy, unspecified: Secondary | ICD-10-CM

## 2016-01-28 DIAGNOSIS — Z95810 Presence of automatic (implantable) cardiac defibrillator: Secondary | ICD-10-CM

## 2016-01-28 NOTE — Progress Notes (Signed)
Remote ICD transmission.   

## 2016-01-28 NOTE — Telephone Encounter (Signed)
LMOVM reminding pt to send remote transmission.   

## 2016-01-30 ENCOUNTER — Telehealth: Payer: Self-pay

## 2016-01-30 NOTE — Telephone Encounter (Signed)
Remote ICM transmission received.  Attempted patient call and left message for return call.   

## 2016-01-30 NOTE — Progress Notes (Addendum)
EPIC Encounter for ICM Monitoring  Patient Name: Kevin [name] is a 70 y.o. male Date: 01/30/2016 Primary Care Physican: [name], MD Primary Cardiologist: [name] Electrophysiologist: [name] Dry Weight: 194 lbs   Bi-V Pacing 98.8%      In the past month, have you:  1. Gained more than 2 pounds in a day or more than 5 pounds in a week? No  2. Had changes in your medications (with verification of current medications)? No  3. Had more shortness of breath than is usual for you? No  4. Limited your activity because of shortness of breath? No  5. Not been able to sleep because of shortness of breath? No  6. Had increased swelling in your feet or ankles? No  7. Had symptoms of dehydration (dizziness, dry mouth, increased thirst, decreased urine output) No  8. Had changes in sodium restriction? No  9. Been compliant with medication? No   ICM trend: 3 month view for 01/28/2016    ICM trend: 1 year view for 01/28/2016    Follow-up plan: ICM clinic phone appointment on 03/01/2016.  Thoracic impedance trending along reference line suggesting stable fluid levels.  He stated he is doing well from cardiac standpoint but does have prostate cancer.  He will be getting a 2nd opinion to see if he needs radiation.   Encouraged him to call for any fluid symptoms.  No changes today.       Rosalene Billings, RN, CCM 01/30/2016 8:41 AM

## 2016-02-10 ENCOUNTER — Other Ambulatory Visit: Payer: Self-pay | Admitting: Family Medicine

## 2016-02-13 ENCOUNTER — Ambulatory Visit
Admission: RE | Admit: 2016-02-13 | Discharge: 2016-02-13 | Disposition: A | Discharge status: Home / Self Care | Payer: Medicare HMO | Source: Ambulatory Visit | Attending: Radiation Oncology | Admitting: Radiation Oncology

## 2016-02-13 DIAGNOSIS — C61 Malignant neoplasm of prostate: Secondary | ICD-10-CM

## 2016-02-13 NOTE — Progress Notes (Deleted)
  Radiation Oncology         [phone] ________________________________  Name: Kevin [name] MRN: [id number]  Date: 02/13/2016  DOB: [birth year]  SIMULATION AND TREATMENT PLANNING NOTE    ICD-9-CM ICD-10-CM   1. Malignant neoplasm of prostate ([place]) 185 C61     DIAGNOSIS:  70 y.o. gentleman with stage T1c adenocarcinoma of the prostate with a Gleason's score of 3+4 and a PSA of 4.86  NARRATIVE:  The patient was brought to the [facility].  Identity was confirmed.  All relevant records and images related to the planned course of therapy were reviewed.  The patient freely provided informed written consent to proceed with treatment after reviewing the details related to the planned course of therapy. The consent form was witnessed and verified by the simulation staff.  Then, the patient was set-up in a stable reproducible supine position for radiation therapy.  A vacuum lock pillow device was custom fabricated to position his legs in a reproducible immobilized position.  Then, I performed a urethrogram under sterile conditions to identify the prostatic apex.  CT images were obtained.  Surface markings were placed.  The CT images were loaded into the planning software.  Then the prostate target and avoidance structures including the rectum, bladder, bowel and hips were contoured.  Treatment planning then occurred.  The radiation prescription was entered and confirmed.  A total of 1 complex treatment devices were fabricated. I have requested : Intensity Modulated Radiotherapy (IMRT) is medically necessary for this case for the following reason:  Rectal sparing.Marland Kitchen  PLAN:  The patient will receive 78 Gy in 40 fractions.  ________________________________  Sheral Apley Tammi Klippel, M.D.  This document serves as a record of services personally performed by Tyler Pita, MD. It was created on his behalf by Arlyce Harman, a trained medical scribe. The creation of this record is based on  the scribe's personal observations and the provider's statements to them. This document has been checked and approved by the attending provider.

## 2016-02-25 ENCOUNTER — Other Ambulatory Visit: Payer: Self-pay | Admitting: Cardiovascular Disease

## 2016-02-26 LAB — CUP PACEART REMOTE DEVICE CHECK
Battery Voltage: 2.96 V
Brady Statistic AP VP Percent: 0.02 %
Brady Statistic AP VS Percent: 0.01 %
Brady Statistic AS VP Percent: 99.31 %
Brady Statistic AS VS Percent: 0.65 %
Date Time Interrogation Session: 20170322191805
HIGH POWER IMPEDANCE MEASURED VALUE: 79 Ohm
Implantable Lead Implant Date: 20130123
Implantable Lead Implant Date: 20160316
Implantable Lead Location: 753858
Implantable Lead Model: 6935
Lead Channel Impedance Value: 1121 Ohm
Lead Channel Impedance Value: 1121 Ohm
Lead Channel Impedance Value: 418 Ohm
Lead Channel Impedance Value: 475 Ohm
Lead Channel Impedance Value: 551 Ohm
Lead Channel Impedance Value: 551 Ohm
Lead Channel Impedance Value: 589 Ohm
Lead Channel Impedance Value: 912 Ohm
Lead Channel Pacing Threshold Amplitude: 0.5 V
Lead Channel Pacing Threshold Amplitude: 0.5 V
Lead Channel Pacing Threshold Amplitude: 4 V
Lead Channel Pacing Threshold Pulse Width: 0.4 ms
Lead Channel Pacing Threshold Pulse Width: 1 ms
Lead Channel Sensing Intrinsic Amplitude: 3.625 mV
Lead Channel Setting Pacing Amplitude: 2 V
Lead Channel Setting Sensing Sensitivity: 0.3 mV
MDC IDC LEAD IMPLANT DT: 20160316
MDC IDC LEAD LOCATION: 753859
MDC IDC LEAD LOCATION: 753860
MDC IDC LEAD MODEL: 4598
MDC IDC MSMT BATTERY REMAINING LONGEVITY: 50 mo
MDC IDC MSMT LEADCHNL LV IMPEDANCE VALUE: 1045 Ohm
MDC IDC MSMT LEADCHNL LV IMPEDANCE VALUE: 494 Ohm
MDC IDC MSMT LEADCHNL LV IMPEDANCE VALUE: 684 Ohm
MDC IDC MSMT LEADCHNL LV IMPEDANCE VALUE: 684 Ohm
MDC IDC MSMT LEADCHNL LV IMPEDANCE VALUE: 912 Ohm
MDC IDC MSMT LEADCHNL RA PACING THRESHOLD PULSEWIDTH: 0.4 ms
MDC IDC MSMT LEADCHNL RA SENSING INTR AMPL: 3.625 mV
MDC IDC MSMT LEADCHNL RV SENSING INTR AMPL: 10.5 mV
MDC IDC MSMT LEADCHNL RV SENSING INTR AMPL: 10.5 mV
MDC IDC SET LEADCHNL LV PACING AMPLITUDE: 4.5 V
MDC IDC SET LEADCHNL LV PACING PULSEWIDTH: 1 ms
MDC IDC STAT BRADY RA PERCENT PACED: 0.03 %
MDC IDC STAT BRADY RV PERCENT PACED: 54.92 %

## 2016-02-26 NOTE — Telephone Encounter (Signed)
REFILL 

## 2016-02-27 ENCOUNTER — Encounter: Payer: Self-pay | Admitting: Cardiology

## 2016-03-01 ENCOUNTER — Other Ambulatory Visit: Payer: Self-pay | Admitting: Endocrinology

## 2016-03-01 ENCOUNTER — Ambulatory Visit (INDEPENDENT_AMBULATORY_CARE_PROVIDER_SITE_OTHER): Payer: Medicare HMO

## 2016-03-01 DIAGNOSIS — Z95810 Presence of automatic (implantable) cardiac defibrillator: Secondary | ICD-10-CM

## 2016-03-01 DIAGNOSIS — I5022 Chronic systolic (congestive) heart failure: Secondary | ICD-10-CM

## 2016-03-01 NOTE — Progress Notes (Signed)
EPIC Encounter for ICM Monitoring  Patient Name: Kevin [name] is a 70 y.o. male Date: 03/01/2016 Primary Care Physican: [name], MD Primary Cardiologist: [name] Electrophysiologist: [name] Dry Weight: 194 lbs   Bi-V Pacing 98.9%      In the past month, have you:  1. Gained more than 2 pounds in a day or more than 5 pounds in a week? no  2. Had changes in your medications (with verification of current medications)? no  3. Had more shortness of breath than is usual for you? no  4. Limited your activity because of shortness of breath? no  5. Not been able to sleep because of shortness of breath? no  6. Had increased swelling in your feet or ankles? no  7. Had symptoms of dehydration (dizziness, dry mouth, increased thirst, decreased urine output) no  8. Had changes in sodium restriction? no  9. Been compliant with medication? Yes   ICM trend: 3 month view for 03/01/2016   ICM trend: 1 year view for 03/01/2016   Follow-up plan: ICM clinic phone appointment on 04/01/2016.  Thoracic impedance below reference line from 02/09/2016 to 02/21/2016 suggesting fluid accumulation and returned to reference line 02/25/2016.  Patient denied fluid symptoms.   He reported he has decided not to do any type of prostate cancer treatment at this time after getting 2nd opinion and doing a lot of research.  He is feeling fine and denied any problems at this time.  Education given to limit sodium intake to < 2000 mg and fluid intake to 64 oz daily.  Encouraged to call for any fluid symptoms.  No changes today.     Rosalene Billings, RN, CCM 03/01/2016 11:18 AM

## 2016-03-03 ENCOUNTER — Encounter: Payer: Self-pay | Admitting: Radiation Oncology

## 2016-03-03 NOTE — Progress Notes (Addendum)
Informed by the simulation therapist via on 02/13/16 at 1443 that the patient called and cancelled his simulation appointment. The therapist reported that the patient doesn't wish to proceed with radiation after researching side effects associated with it online. Therapist reports he went onto states there is a new medication he is interested in trying and scheduled to see another doctor next week. Dr. Tammi Klippel and Shona Simpson were included in this email.

## 2016-03-09 ENCOUNTER — Telehealth: Payer: Self-pay | Admitting: Radiation Oncology

## 2016-03-09 NOTE — Telephone Encounter (Signed)
LM for the patient to call me back to discuss his plans for his cancer care. He cancelled his simulation with [place] for radiation treatment, and was going to follow up with another MD to discuss his case. I asked that he call me back to let us know his plans.

## 2016-03-12 ENCOUNTER — Encounter: Payer: Self-pay | Admitting: Cardiology

## 2016-04-01 ENCOUNTER — Ambulatory Visit (INDEPENDENT_AMBULATORY_CARE_PROVIDER_SITE_OTHER): Payer: Medicare HMO

## 2016-04-01 ENCOUNTER — Telehealth: Payer: Self-pay | Admitting: Cardiology

## 2016-04-01 DIAGNOSIS — Z95810 Presence of automatic (implantable) cardiac defibrillator: Secondary | ICD-10-CM

## 2016-04-01 DIAGNOSIS — I5022 Chronic systolic (congestive) heart failure: Secondary | ICD-10-CM

## 2016-04-01 NOTE — Telephone Encounter (Signed)
LMOVM reminding pt to send remote transmission.   

## 2016-04-02 NOTE — Progress Notes (Addendum)
EPIC Encounter for ICM Monitoring  Patient Name: Kevin [name] is a 70 y.o. male Date: 04/02/2016 Primary Care Physican: [name], MD Primary Cardiologist: [name] Electrophysiologist: [name] Dry Weight: 194lbs   Bi-V Pacing 100%      In the past month, have you:  1. Gained more than 2 pounds in a day or more than 5 pounds in a week? No  2. Had changes in your medications (with verification of current medications)? No  3. Had more shortness of breath than is usual for you? No  4. Limited your activity because of shortness of breath? No  5. Not been able to sleep because of shortness of breath? No  6. Had increased swelling in your feet or ankles? No  7. Had symptoms of dehydration (dizziness, dry mouth, increased thirst, decreased urine output) No  8. Had changes in sodium restriction? No  9. Been compliant with medication? Yes   ICM trend: 3 month view for 04/01/2016   ICM trend: 1 year view for 04/01/2016   Follow-up plan: ICM clinic phone appointment on 05/18/2016.  Office appointment with Chanetta Marshall, NP, 04/16/2016.   Thoracic impedance below reference line from 03/27/2016 to 03/31/2016 suggesting fluid accumulation and returned to baseline 03/31/2016.  He reported he is feeling well and denied any problems at this time.  Encouraged him to call for any fluid symptoms.   Rosalene Billings, RN, CCM 04/02/2016 9:23 AM

## 2016-04-15 NOTE — Progress Notes (Signed)
Electrophysiology Office Note Date: 04/16/2016  ID:  Antwyne, Klinglesmith [birth year], MRN [id number]  PCP: [name], MD Primary Cardiologist: [name] Electrophysiologist: [name]  CC: CRT follow up  LEAM SAYE is a 70 y.o. male is seen today for Dr Rayann Heman.  He presents today for routine electrophysiology follow up.  He underwent pacemaker implantation originally in 2013 and subsequently developed NICM with worsening shortness of breath and fatigue.  His EF remained depressed despite optimal medical therapy.  He underwent CRTD upgrade 2016. His shortness of breath and fatigue have improved with CRT.  He denies chest pain, palpitations, dyspnea, PND, orthopnea, nausea, vomiting, dizziness, syncope, edema, weight gain, or early satiety.  He has not had ICD shocks.   Device History: MDT dual chamber pacemaker implanted 11/2011 for Mobtiz II heart block (Dr C); upgrade to MDT CRTD 01/2015 for non ischemic cardiomyopathy, CHF, pacing induced LBBB by Dr Rayann Heman History of appropriate therapy: No History of AAD therapy: No   Past Medical History  Diagnosis Date  . Anemia   . Nonischemic cardiomyopathy ([place])     a. MDT CRTD upgrade 2016  . History of colon polyps   . LBBB (left bundle branch block)   . Heart block AV second degree     a. s/p PPM implant with subsequent CRTD upgrade  . Prostate cancer ([place])     "not been treated yet; on a wait and see" (01/22/2015)  . Hypertension   . High cholesterol   . Type II diabetes mellitus ([place])   . Agent orange exposure     in [place]   Past Surgical History  Procedure Laterality Date  . Inguinal hernia repair Left 1980  . Tonsillectomy    . Shrapnel surgery      "in [place]"  . Examination under anesthesia  04/06/2012    Procedure: EXAM UNDER ANESTHESIA;  Surgeon: Marcello Moores A. Cornett, MD;  Location: [facility];  Service: General;  Laterality: N/A;  . Nm myocar perf wall motion  01/21/2012    mod-severe perfusion defect due to  infarct/scar w/mild perinfarct ischemia in the apical,basal inferoseptal,basal inferior,mid inferoseptal,midinferior and apical inferior regions  . Cardiac catheterization  01/27/2012    normal coronaries, dilated LV c/w nonischemic cardiomyopathy  . Permanent pacemaker insertion N/A 12/01/2011    Medtronic implanted by Dr Sallyanne Kuster  . Left heart catheterization with coronary angiogram N/A 01/27/2012    Procedure: LEFT HEART CATHETERIZATION WITH CORONARY ANGIOGRAM;  Surgeon: Troy Sine, MD;  Location: [facility] CATH LAB;  Service: Cardiovascular;  Laterality: N/A;  . Incision and drainage perirectal abscess  ~ 2010; 2015  . Basal cell carcinoma excision      "back, face, neck"  . Bi-ventricular implantable cardioverter defibrillator upgrade N/A 01/22/2015    MDT CRTD upgrade by Dr Rayann Heman  . Prostate biopsy    . Prostate biopsy      Current Outpatient Prescriptions  Medication Sig Dispense Refill  . ACCU-CHEK COMPACT PLUS test strip 1 each by Other route as needed. 100 each 0  . aspirin 81 MG tablet Take 81 mg by mouth daily.      Marland Kitchen atorvastatin (LIPITOR) 80 MG tablet Take 40 mg by mouth daily at 6 PM.    . buPROPion (WELLBUTRIN XL) 150 MG 24 hr tablet TAKE 1 TABLET BY MOUTH EVERY DAY 90 tablet 1  . carvedilol (COREG) 12.5 MG tablet TAKE ONE TABLET BY MOUTH TWICE DAILY WITH A MEAL 180 tablet 0  . clobetasol ointment (  TEMOVATE) [id number] % Apply 1 application topically daily as needed (itching).     . Coenzyme Q10 (COQ-10) 200 MG CAPS Take by mouth.    Marland Kitchen FLUZONE HIGH-DOSE 0.5 ML SUSY ADM 0.5ML IM UTD  0  . furosemide (LASIX) 40 MG tablet Take 40 mg by mouth daily.    . furosemide (LASIX) 40 MG tablet TAKE ONE TABLET BY MOUTH EVERY DAY WITH BREAKFAST 90 tablet 0  . glipiZIDE (GLUCOTROL XL) 5 MG 24 hr tablet TAKE 1 TABLET BY MOUTH EVERY DAY WITH BREAKFAST 30 tablet 3  . latanoprost (XALATAN) 0.005 % ophthalmic solution Place 1 drop into the right eye daily.     Marland Kitchen losartan (COZAAR) 100 MG tablet Take 50  mg by mouth daily.    . SitaGLIPtin-MetFORMIN HCl [phone] MG TB24 Take 1 tablet by mouth daily. 30 tablet 1  . spironolactone (ALDACTONE) 25 MG tablet TAKE 1/2 TABLET BY MOUTH EVERY DAY 45 tablet 0  . VIAGRA 100 MG tablet TAKE ONE TABLET BY MOUTH AS NEEDED FOR ERECTILE DYSFUNCTION. 10 tablet 3   No current facility-administered medications for this visit.    Allergies:   Sulfa antibiotics   Social History: Social History   Social History  . Marital Status: Married    Spouse Name: N/A  . Number of Children: 0  . Years of Education: N/A   Occupational History  . morgage banker    Social History Main Topics  . Smoking status: Former Smoker -- 1.00 packs/day for 20 years    Types: Cigarettes    Quit date: 04/13/1984  . Smokeless tobacco: Never Used  . Alcohol Use: Yes     Comment: 01/22/2015 "maybe 3 beers or glasses of wine /month"  . Drug Use: No  . Sexual Activity: Yes   Other Topics Concern  . Not on file   Social History Narrative   Pt lives in [place] with spouse.  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    Family History: Family History  Problem Relation Age of Onset  . Ovarian cancer Mother   . Leukemia Father   . Heart disease Father   . Diabetes Neg Hx     Review of Systems: All other systems reviewed and are otherwise negative except as noted above.   Physical Exam: VS:  BP 128/64 mmHg  Pulse 76  Ht 6\' 3"  (1.905 m)  Wt 198 lb 6.4 oz (89.994 kg)  BMI 24.80 kg/m2  SpO2 97% , BMI Body mass index is 24.8 kg/(m^2).  GEN- The patient is well appearing, alert and oriented x 3 today.   HEENT: normocephalic, atraumatic; sclera clear, conjunctiva pink; hearing intact; oropharynx clear; neck supple, no JVP Lymph- no cervical lymphadenopathy Lungs- Clear to ausculation bilaterally, normal work of breathing.  No wheezes, rales, rhonchi Heart- Regular rate and rhythm (paced) GI- soft, non-tender, non-distended, bowel sounds  present  Extremities- no clubbing, cyanosis, or edema; DP/PT/radial pulses 2+ bilaterally MS- no significant deformity or atrophy Skin- warm and dry, no rash or lesion; ICD pocket well healed Psych- euthymic mood, full affect Neuro- strength and sensation are intact  ICD interrogation- reviewed in detail today,  See PACEART report  EKG:  EKG is not ordered today.  Recent Labs: 08/22/2015: ALT 9; BUN 16; Creatinine, Ser 0.98; Potassium 4.4; Sodium 138   Wt Readings from Last 3 Encounters:  04/16/16 198 lb 6.4 oz (89.994 kg)  01/19/16 200 lb (90.719 kg)  08/25/15 201 lb (91.173 kg)  Assessment and Plan:  1.  Chronic systolic dysfunction euvolemic today Stable on an appropriate medical regimen Normal ICD function. Per Dr Jackalyn Lombard last note, device reprogrammed LV1-RV coil with more favorable threshold and EKG.  Device was changed back to LV4-LV1 in 05/2015 2/2 diaphragmatic stim on right side.  Threshold LV4-LV1 3.5V@1msec  today.  Device reprogrammed back to LV1-RV coil with smaller safety margin to allow for improved battery.  Pt advised to let us know if he has recurrent diaphragmatic stim.  See Pace Art report No changes today Update echo post CRT BMET today   2.  Sick sinus syndrome/Mobitz II Normal device function Pt is device dependent today    Current medicines are reviewed at length with the patient today.   The patient does not have concerns regarding his medicines.  The following changes were made today:  none  Labs/ tests ordered today include: BMET, echo   Disposition:   Follow up with ICM clinic, [facility], Dr Rayann Heman 1 year   Signed, Chanetta Marshall, NP 04/16/2016 12:19 PM  [place] [address] [place] [address] [phone] (office) [phone] (fax)

## 2016-04-16 ENCOUNTER — Encounter: Payer: Self-pay | Admitting: Internal Medicine

## 2016-04-16 ENCOUNTER — Encounter: Payer: Self-pay | Admitting: Nurse Practitioner

## 2016-04-16 ENCOUNTER — Ambulatory Visit (INDEPENDENT_AMBULATORY_CARE_PROVIDER_SITE_OTHER): Payer: Medicare HMO | Admitting: Nurse Practitioner

## 2016-04-16 VITALS — BP 128/64 | HR 76 | Ht 75.0 in | Wt 198.4 lb

## 2016-04-16 DIAGNOSIS — I441 Atrioventricular block, second degree: Secondary | ICD-10-CM | POA: Diagnosis not present

## 2016-04-16 DIAGNOSIS — I5022 Chronic systolic (congestive) heart failure: Secondary | ICD-10-CM

## 2016-04-16 LAB — CUP PACEART INCLINIC DEVICE CHECK
Implantable Lead Implant Date: 20130123
Implantable Lead Implant Date: 20160316
Implantable Lead Location: 753858
Implantable Lead Location: 753859
Implantable Lead Model: 4598
Implantable Lead Model: 5076
MDC IDC LEAD IMPLANT DT: 20160316
MDC IDC LEAD LOCATION: 753860
MDC IDC SESS DTM: 20170609131434

## 2016-04-16 LAB — BASIC METABOLIC PANEL
BUN: 12 mg/dL (ref 7–25)
CHLORIDE: 100 mmol/L (ref 98–110)
CO2: 28 mmol/L (ref 20–31)
Calcium: 8.6 mg/dL (ref 8.6–10.3)
Creat: 0.96 mg/dL (ref 0.70–1.18)
GLUCOSE: 222 mg/dL — AB (ref 65–99)
POTASSIUM: 4.4 mmol/L (ref 3.5–5.3)
SODIUM: 137 mmol/L (ref 135–146)

## 2016-04-16 NOTE — Patient Instructions (Signed)
Medication Instructions:   Your physician recommends that you continue on your current medications as directed. Please refer to the Current Medication list given to you today.    If you need a refill on your cardiac medications before your next appointment, please call your pharmacy.  Labwork:  BMET     Testing/Procedures. Your physician has requested that you have an echocardiogram. Echocardiography is a painless test that uses sound waves to create images of your heart. It provides your doctor with information about the size and shape of your heart and how well your heart's chambers and valves are working. This procedure takes approximately one hour. There are no restrictions for this procedure.     Follow-Up:  Remote monitoring is used to monitor your Pacemaker of ICD from home. This monitoring reduces the number of office visits required to check your device to one time per year. It allows [place] to keep an eye on the functioning of your device to ensure it is working properly. You are scheduled for a device check from home on . 07/16/2016.Marland KitchenMarland KitchenMarland KitchenYou may send your transmission at any time that day. If you have a wireless device, the transmission will be sent automatically. After your physician reviews your transmission, you will receive a postcard with your next transmission date.  Your physician wants you to follow-up in: [place] will receive a reminder letter in the mail two months in advance. If you don't receive a letter, please call our office to schedule the follow-up appointment.     Any Other Special Instructions Will Be Listed Below (If Applicable).

## 2016-04-19 ENCOUNTER — Ambulatory Visit (INDEPENDENT_AMBULATORY_CARE_PROVIDER_SITE_OTHER): Payer: Medicare HMO | Admitting: *Deleted

## 2016-04-19 ENCOUNTER — Encounter: Payer: Self-pay | Admitting: Internal Medicine

## 2016-04-19 DIAGNOSIS — I5022 Chronic systolic (congestive) heart failure: Secondary | ICD-10-CM

## 2016-04-19 DIAGNOSIS — Z4502 Encounter for adjustment and management of automatic implantable cardiac defibrillator: Secondary | ICD-10-CM

## 2016-04-19 LAB — CUP PACEART INCLINIC DEVICE CHECK
Brady Statistic AP VS Percent: 0.01 %
Brady Statistic AS VP Percent: 99.91 %
Brady Statistic RV Percent Paced: 99.98 %
HighPow Impedance: 69 Ohm
Implantable Lead Implant Date: 20160316
Implantable Lead Location: 753858
Implantable Lead Location: 753859
Implantable Lead Location: 753860
Implantable Lead Model: 6935
Lead Channel Impedance Value: 1007 Ohm
Lead Channel Impedance Value: 399 Ohm
Lead Channel Impedance Value: 399 Ohm
Lead Channel Impedance Value: 418 Ohm
Lead Channel Impedance Value: 513 Ohm
Lead Channel Impedance Value: 627 Ohm
Lead Channel Impedance Value: 931 Ohm
Lead Channel Impedance Value: 988 Ohm
Lead Channel Pacing Threshold Amplitude: 3.25 V
Lead Channel Setting Pacing Amplitude: 2 V
Lead Channel Setting Pacing Amplitude: 2.5 V
Lead Channel Setting Pacing Pulse Width: 0.4 ms
Lead Channel Setting Sensing Sensitivity: 0.3 mV
MDC IDC LEAD IMPLANT DT: 20130123
MDC IDC LEAD IMPLANT DT: 20160316
MDC IDC LEAD MODEL: 4598
MDC IDC MSMT BATTERY REMAINING LONGEVITY: 46 mo
MDC IDC MSMT BATTERY VOLTAGE: 2.94 V
MDC IDC MSMT LEADCHNL LV IMPEDANCE VALUE: 513 Ohm
MDC IDC MSMT LEADCHNL LV IMPEDANCE VALUE: 589 Ohm
MDC IDC MSMT LEADCHNL LV IMPEDANCE VALUE: 798 Ohm
MDC IDC MSMT LEADCHNL LV IMPEDANCE VALUE: 798 Ohm
MDC IDC MSMT LEADCHNL LV PACING THRESHOLD PULSEWIDTH: 1 ms
MDC IDC MSMT LEADCHNL RV IMPEDANCE VALUE: 475 Ohm
MDC IDC SESS DTM: 20170612085325
MDC IDC SET LEADCHNL LV PACING AMPLITUDE: 4 V
MDC IDC SET LEADCHNL LV PACING PULSEWIDTH: 1 ms
MDC IDC STAT BRADY AP VP PERCENT: 0.08 %
MDC IDC STAT BRADY AS VS PERCENT: 0 %
MDC IDC STAT BRADY RA PERCENT PACED: 0.09 %

## 2016-04-19 NOTE — Progress Notes (Signed)
Kevin [name] walked in to clinic because of changes made on Friday were "not right." He reports diaphragmatic stimulation while laying on right side. LV vector changed back to LV4-LV1. LV threshold 3.25V@1ms . Output changed to 4V @ 36ms with adaptive on with 0.5V safety margin.

## 2016-05-05 ENCOUNTER — Ambulatory Visit (HOSPITAL_COMMUNITY): Payer: Medicare HMO | Attending: Cardiology

## 2016-05-05 ENCOUNTER — Other Ambulatory Visit: Payer: Self-pay

## 2016-05-05 DIAGNOSIS — I071 Rheumatic tricuspid insufficiency: Secondary | ICD-10-CM | POA: Insufficient documentation

## 2016-05-05 DIAGNOSIS — I5022 Chronic systolic (congestive) heart failure: Secondary | ICD-10-CM

## 2016-05-05 DIAGNOSIS — I509 Heart failure, unspecified: Secondary | ICD-10-CM | POA: Diagnosis present

## 2016-05-05 DIAGNOSIS — I517 Cardiomegaly: Secondary | ICD-10-CM | POA: Diagnosis not present

## 2016-05-05 LAB — ECHOCARDIOGRAM COMPLETE
Ao-asc: 30 cm
CHL CUP DOP CALC LVOT VTI: 15 cm
CHL CUP MV DEC (S): 180
CHL CUP RV SYS PRESS: 25 mmHg
CHL CUP STROKE VOLUME: 33 mL
CHL CUP TV REG PEAK VELOCITY: 232 cm/s
E decel time: 180 msec
EERAT: 12.54
FS: 17 % — AB (ref 28–44)
IV/PV OW: 1.09
LA diam index: 1.65 cm/m2
LASIZE: 36 mm
LAVOL: 47 mL
LAVOLA4C: 41 mL
LAVOLIN: 21.5 mL/m2
LDCA: 3.46 cm2
LEFT ATRIUM END SYS DIAM: 36 mm
LV E/e'average: 12.54
LV PW d: 10.8 mm — AB (ref 0.6–1.1)
LV sys vol index: 26 mL/m2
LV sys vol: 57 mL (ref 21–61)
LVDIAVOL: 90 mL (ref 62–150)
LVDIAVOLIN: 41 mL/m2
LVEEMED: 12.54
LVELAT: 4.09 cm/s
LVOT peak grad rest: 3 mmHg
LVOTD: 21 mm
LVOTPV: 86.5 cm/s
LVOTSV: 52 mL
Lateral S' vel: 11.1 cm/s
MV pk E vel: 51.3 m/s
MVPKAVEL: 79 m/s
Simpson's disk: 37
TDI e' lateral: 4.09
TDI e' medial: 4.68
TR max vel: 232 cm/s

## 2016-05-10 ENCOUNTER — Telehealth: Payer: Self-pay | Admitting: *Deleted

## 2016-05-10 NOTE — Telephone Encounter (Signed)
-----   Message from Kevin Berthold, NP sent at 05/05/2016  7:24 PM EDT ----- Please notify patient of echo results. Heart pumping function slightly improved after CRT

## 2016-05-13 ENCOUNTER — Other Ambulatory Visit: Payer: Self-pay | Admitting: Family Medicine

## 2016-05-15 DIAGNOSIS — S93601A Unspecified sprain of right foot, initial encounter: Secondary | ICD-10-CM | POA: Diagnosis not present

## 2016-05-18 ENCOUNTER — Ambulatory Visit (INDEPENDENT_AMBULATORY_CARE_PROVIDER_SITE_OTHER): Payer: Medicare HMO

## 2016-05-18 DIAGNOSIS — I5022 Chronic systolic (congestive) heart failure: Secondary | ICD-10-CM

## 2016-05-18 DIAGNOSIS — Z0389 Encounter for observation for other suspected diseases and conditions ruled out: Secondary | ICD-10-CM | POA: Diagnosis not present

## 2016-05-18 DIAGNOSIS — Z4502 Encounter for adjustment and management of automatic implantable cardiac defibrillator: Secondary | ICD-10-CM

## 2016-05-18 NOTE — Progress Notes (Signed)
EPIC Encounter for ICM Monitoring  Patient Name: Kevin [name] is a 70 y.o. male Date: 05/18/2016 Primary Care Physican: [name], MD Primary Cardiologist: [name] Electrophysiologist: [name] Dry Weight: unknown Bi-V Pacing:  100%       Heart Failure questions reviewed, pt asymptomatic   Thoracic impedence decreased 04/13/2016 to 04/28/2016 suggesting fluid accumulation and stablized 04/28/2016.   Low sodium diet education provided  Recommendations: None  Patient had questions regarding a jumping sensation when pacing.  He spoke to Debroah Loop, Device RN regarding device.  ICM trend: 05/18/2016   Follow-up plan: ICM clinic phone appointment on 06/18/2016.  Copy of ICM check sent to device physician.   Rosalene Billings, RN 05/18/2016 9:13 AM

## 2016-05-23 ENCOUNTER — Other Ambulatory Visit: Payer: Self-pay | Admitting: Cardiovascular Disease

## 2016-05-24 NOTE — Telephone Encounter (Signed)
Rx(s) sent to pharmacy electronically.  

## 2016-05-28 DIAGNOSIS — M25572 Pain in left ankle and joints of left foot: Secondary | ICD-10-CM | POA: Diagnosis not present

## 2016-06-08 ENCOUNTER — Other Ambulatory Visit (INDEPENDENT_AMBULATORY_CARE_PROVIDER_SITE_OTHER): Payer: Medicare HMO

## 2016-06-08 DIAGNOSIS — E119 Type 2 diabetes mellitus without complications: Secondary | ICD-10-CM

## 2016-06-08 LAB — COMPREHENSIVE METABOLIC PANEL
ALBUMIN: 4 g/dL (ref 3.5–5.2)
ALK PHOS: 63 U/L (ref 39–117)
ALT: 8 U/L (ref 0–53)
AST: 18 U/L (ref 0–37)
BUN: 18 mg/dL (ref 6–23)
CALCIUM: 9.2 mg/dL (ref 8.4–10.5)
CO2: 26 mEq/L (ref 19–32)
Chloride: 100 mEq/L (ref 96–112)
Creatinine, Ser: 1.14 mg/dL (ref 0.40–1.50)
GFR: 67.39 mL/min (ref >60.00)
Glucose, Bld: 143 mg/dL — ABNORMAL HIGH (ref 70–99)
POTASSIUM: 4.1 meq/L (ref 3.5–5.1)
Sodium: 134 mEq/L — ABNORMAL LOW (ref 135–145)
TOTAL PROTEIN: 7.4 g/dL (ref 6.0–8.3)
Total Bilirubin: 0.8 mg/dL (ref 0.2–1.2)

## 2016-06-08 LAB — HEMOGLOBIN A1C: HEMOGLOBIN A1C: 7.2 % — AB (ref 4.6–6.5)

## 2016-06-09 ENCOUNTER — Ambulatory Visit (INDEPENDENT_AMBULATORY_CARE_PROVIDER_SITE_OTHER): Payer: Medicare HMO | Admitting: Endocrinology

## 2016-06-09 ENCOUNTER — Other Ambulatory Visit: Payer: Self-pay | Admitting: Endocrinology

## 2016-06-09 ENCOUNTER — Encounter: Payer: Self-pay | Admitting: Endocrinology

## 2016-06-09 VITALS — BP 106/62 | HR 76 | Ht 75.0 in | Wt 193.0 lb

## 2016-06-09 DIAGNOSIS — E1165 Type 2 diabetes mellitus with hyperglycemia: Secondary | ICD-10-CM

## 2016-06-09 DIAGNOSIS — E1141 Type 2 diabetes mellitus with diabetic mononeuropathy: Secondary | ICD-10-CM | POA: Diagnosis not present

## 2016-06-09 MED ORDER — GABAPENTIN 100 MG PO CAPS: 100.0000 mg | ORAL_CAPSULE | Freq: Three times a day (TID) | ORAL | 3 refills | Status: DC

## 2016-06-09 MED ORDER — SITAGLIP PHOS-METFORMIN HCL ER 50-1000 MG PO TB24: ORAL_TABLET | ORAL | 2 refills | Status: DC

## 2016-06-09 NOTE — Patient Instructions (Signed)
Check blood sugars on waking up  2-3x per week  Also check blood sugars about 2 hours after a meal and do this after different meals by rotation  Recommended blood sugar levels on waking up is 90-130 and about 2 hours after meal is 130-160  Please bring your blood sugar monitor to each visit, thank you  Go up on dose of Janumet  Gapapentin as needed for burning

## 2016-06-09 NOTE — Progress Notes (Signed)
Patient ID: Kevin [name], male   DOB: April 30, 1946, 70 y.o.   MRN: [id number]    Reason for Appointment:  Followup for Type 2 Diabetes   History of Present Illness:          Diagnosis: Type 2 diabetes mellitus, date of diagnosis: 2001        Past history: His previous records are not available but initially his symptoms included weight loss He thinks he has been on metformin for several years and more recently has been taking 1000 mg twice a day [facility] His A1c was 6.9 in 2013 but a year later was 8.1 He had been on glipizide 5 mg twice a day in addition to his metformin 1 g twice a day   Prior to his consultation when his A1c was 10% and he was given Januvia in addition  Recent history:  He is taking Janumet XR 100/1000 daily without any side effects Also continues to take 5 mg glipizide ER without any tendency to hypoglycemia His A1c has stayed around 7% consistently  Current management, blood sugar patterns and problems identified: 1. His recent blood sugars are significantly higher although he is checking somewhat infrequently 2. He says that he is not always consistent with diet and occasionally eating sweets like ice cream 3. More recently because of a left toe injury has not exercised as much as usual but he does usually walk on treadmill regularly 4. He has not been seen in follow-up for almost a year  Has been exercising at least 3 times a week, mostly with walking Hypoglycemia: None recently       Oral hypoglycemic drugs the patient is taking are: Glipizide ER 5 mg qd, Janumet XR 100/1000 daily Side effects from medications have been: None  Glucose monitoring:  done 1-2 times a day         Glucometer:  Accu-Chek compact     Blood Glucose readings  by download  RANGE 138-250 with high readings in the afternoons checking blood sugars only on 8 days of the last 30 Glycemic control:    Lab Results    Component Value Date   HGBA1C 7.2 (H) 06/08/2016   HGBA1C 7.1 (H) 08/22/2015   HGBA1C 7.0 (H) 04/18/2015   Lab Results  Component Value Date   MICROALBUR <0.7 08/22/2015   LDLCALC 53 08/22/2015   CREATININE 1.14 06/08/2016     Self-care: The diet that the patient has been following is: tries to limit portions, may get fried food when eating out, some fast food He is drinking juice periodically especially cranberry juice for his "kidneys"  Meals: 3 meals per day.  breakfast is at 7-8 am and is usually eating a biscuit or special K. cereal Usually eating baked chicken or chicken salad sandwiches for meals and will have fruits for snacks          Exercise:  30-40 minutes, 3-4 days a week on treadmill/stretching         Dietician visit: Most recent:2001,              Compliance with the medical regimen: Good  Retinal exam: Most recent:. 8/16 at [facility] hospital.   Weight history:  Wt Readings from Last 3 Encounters:  06/09/16 193 lb (87.5 kg)  04/16/16 198 lb 6.4 oz (90 kg)  01/19/16 200 lb (90.7 kg)    OTHER active problems: See Review of systems  Lab on 06/08/2016  Component Date Value Ref Range Status  . Hgb A1c MFr Bld 06/08/2016 7.2* 4.6 - 6.5 % Final  . Sodium 06/08/2016 134* 135 - 145 mEq/L Final  . Potassium 06/08/2016 4.1  3.5 - 5.1 mEq/L Final  . Chloride 06/08/2016 100  96 - 112 mEq/L Final  . CO2 06/08/2016 26  19 - 32 mEq/L Final  . Glucose, Bld 06/08/2016 143* 70 - 99 mg/dL Final  . BUN 06/08/2016 18  6 - 23 mg/dL Final  . Creatinine, Ser 06/08/2016 1.14  0.40 - 1.50 mg/dL Final  . Total Bilirubin 06/08/2016 0.8  0.2 - 1.2 mg/dL Final  . Alkaline Phosphatase 06/08/2016 63  39 - 117 U/L Final  . AST 06/08/2016 18  0 - 37 U/L Final  . ALT 06/08/2016 8  0 - 53 U/L Final  . Total Protein 06/08/2016 7.4  6.0 - 8.3 g/dL Final  . Albumin 06/08/2016 4.0  3.5 - 5.2 g/dL Final  . Calcium 06/08/2016 9.2  8.4 - 10.5 mg/dL Final  . GFR 06/08/2016 67.39  >60.00 mL/min Final       Medication List       Accurate as of 06/09/16  3:15 PM. Always use your most recent med list.          ACCU-CHEK COMPACT PLUS test strip Generic drug:  glucose blood 1 each by Other route as needed.   aspirin 81 MG tablet Take 81 mg by mouth daily.   atorvastatin 80 MG tablet Commonly known as:  LIPITOR Take 40 mg by mouth daily at 6 PM.   buPROPion 150 MG 24 hr tablet Commonly known as:  WELLBUTRIN XL TAKE 1 TABLET BY MOUTH EVERY DAY   carvedilol 12.5 MG tablet Commonly known as:  COREG TAKE ONE TABLET BY MOUTH TWICE DAILY WITH A MEAL   clobetasol ointment 0.05 % Commonly known as:  TEMOVATE Apply 1 application topically daily as needed (itching).   CoQ-10 200 MG Caps Take by mouth.   furosemide 40 MG tablet Commonly known as:  LASIX Take 40 mg by mouth daily.   furosemide 40 MG tablet Commonly known as:  LASIX TAKE ONE TABLET BY MOUTH EVERY DAY WITH BREAKFAST   glipiZIDE 5 MG 24 hr tablet Commonly known as:  GLUCOTROL XL TAKE 1 TABLET BY MOUTH EVERY DAY WITH BREAKFAST   latanoprost 0.005 % ophthalmic solution Commonly known as:  XALATAN Place 1 drop into the right eye daily.   losartan 100 MG tablet Commonly known as:  COZAAR Take 50 mg by mouth daily.   SitaGLIPtin-MetFORMIN HCl [phone] MG Tb24 Take 1 tablet by mouth daily.   spironolactone 25 MG tablet Commonly known as:  ALDACTONE TAKE 1/2 TABLET BY MOUTH EVERY DAY   traMADol 50 MG tablet Commonly known as:  ULTRAM   VIAGRA 100 MG tablet Generic drug:  sildenafil TAKE ONE TABLET BY MOUTH AS NEEDED FOR ERECTILE DYSFUNCTION.       Allergies:  Allergies  Allergen Reactions  . Sulfa Antibiotics Swelling    Swelling of lips only.    Past Medical History:  Diagnosis Date  . Agent orange exposure    in [place]  . Anemia   . Heart block AV second degree    a. s/p PPM implant with subsequent CRTD upgrade  . High cholesterol   . History of colon polyps   . Hypertension   .  LBBB (left bundle branch block)   . Nonischemic cardiomyopathy ([place])    a.  MDT CRTD upgrade 2016  . Prostate cancer ([place])    "not been treated yet; on a wait and see" (01/22/2015)  . Type II diabetes mellitus ([place])     Past Surgical History:  Procedure Laterality Date  . BASAL CELL CARCINOMA EXCISION     "back, face, neck"  . BI-VENTRICULAR IMPLANTABLE CARDIOVERTER DEFIBRILLATOR UPGRADE N/A 01/22/2015   MDT CRTD upgrade by Dr Rayann Heman  . CARDIAC CATHETERIZATION  01/27/2012   normal coronaries, dilated LV c/w nonischemic cardiomyopathy  . EXAMINATION UNDER ANESTHESIA  04/06/2012   Procedure: EXAM UNDER ANESTHESIA;  Surgeon: Marcello Moores A. Cornett, MD;  Location: [facility];  Service: General;  Laterality: N/A;  . INCISION AND DRAINAGE PERIRECTAL ABSCESS  ~ 2010; 2015  . INGUINAL HERNIA REPAIR Left 1980  . LEFT HEART CATHETERIZATION WITH CORONARY ANGIOGRAM N/A 01/27/2012   Procedure: LEFT HEART CATHETERIZATION WITH CORONARY ANGIOGRAM;  Surgeon: Troy Sine, MD;  Location: [facility] CATH LAB;  Service: Cardiovascular;  Laterality: N/A;  . NM MYOCAR PERF WALL MOTION  01/21/2012   mod-severe perfusion defect due to infarct/scar w/mild perinfarct ischemia in the apical,basal inferoseptal,basal inferior,mid inferoseptal,midinferior and apical inferior regions  . PERMANENT PACEMAKER INSERTION N/A 12/01/2011   Medtronic implanted by Dr Sallyanne Kuster  . PROSTATE BIOPSY    . PROSTATE BIOPSY    . shrapnel surgery     "in [place]"  . TONSILLECTOMY      Family History  Problem Relation Age of Onset  . Ovarian cancer Mother   . Leukemia Father   . Heart disease Father   . Diabetes Neg Hx     Social History:  reports that he quit smoking about 32 years ago. His smoking use included Cigarettes. He has a 20.00 pack-year smoking history. He has never used smokeless tobacco. He reports that he drinks alcohol. He reports that he does not use drugs.    Review of Systems   He is asking about sensation of burning in his  left foot in the middle and insight.  He thinks this started about a week or 10 days ago after he hit his left toe.  He was seen by the urgent care center and given tramadol  He has eye exams every 6 months regularly at the [facility] but no records available      Lipids: He has been treated with Lipitor 80 mg And also followed by [facility]       Lab Results  Component Value Date   CHOL 102 08/22/2015   HDL 30.90 (L) 08/22/2015   LDLCALC 53 08/22/2015   LDLDIRECT 58.6 09/24/2014   TRIG 92.0 08/22/2015   CHOLHDL 3 08/22/2015      Hypertension: Mild and well controlled with losartan, Aldactone low-dose and carvedilol  Also taking Lasix and followed by cardiologist      Physical Examination:  BP 106/62   Pulse 76   Ht 6\' 3"  (1.905 m)   Wt 193 lb (87.5 kg)   SpO2 96%   BMI 24.12 kg/m   Diabetic Foot Exam - Simple   No data filed     Vibration sense moderately reduced in the toes. Minimal swelling of the left fifth toe but no tenderness, redness or warmth  ASSESSMENT:  Diabetes type 2, uncontrolled    His A1c is stable around 7% although slightly higher However his blood sugars are mostly high at home in the last month or so Although some of this may be related to his inconsistent exercise and diet they  are generally higher than expected Higher readings are in the afternoon and not as much fasting, lab glucose 143 fasting Also he is taking only half the dose of metformin and his Janumet  HYPERLIPIDEMIA:  well controlled except for low HDL, needs follow-up  ?  Neuropathy: He has some burning sensation but also evidence of decreased vibration sense  PLAN:   Janumet XR 50/1000, 2 tablets daily now  Restart exercise been able to  May try gabapentin for the left foot burning  Follow-up with podiatrist is still having pain  Discussed timing of glucose monitoring and blood sugar targets to expect  Try to cut back on portions of high glycemic index  foods like ice cream  Consider consultation with dietitian again  [facility] 06/09/2016, 3:15 PM   Counseling time on subjects discussed above is over 50% of today's 25 minute visit   Note: This office note was prepared with Estate agent. Any transcriptional errors that result from this process are unintentional.

## 2016-06-10 ENCOUNTER — Other Ambulatory Visit: Payer: Self-pay

## 2016-06-10 NOTE — Telephone Encounter (Signed)
Rx refill sent in yesterday for gabapentin already.

## 2016-06-18 ENCOUNTER — Ambulatory Visit (INDEPENDENT_AMBULATORY_CARE_PROVIDER_SITE_OTHER): Payer: Medicare HMO

## 2016-06-18 ENCOUNTER — Telehealth: Payer: Self-pay

## 2016-06-18 DIAGNOSIS — Z0389 Encounter for observation for other suspected diseases and conditions ruled out: Secondary | ICD-10-CM

## 2016-06-18 DIAGNOSIS — I5022 Chronic systolic (congestive) heart failure: Secondary | ICD-10-CM

## 2016-06-18 DIAGNOSIS — Z4502 Encounter for adjustment and management of automatic implantable cardiac defibrillator: Secondary | ICD-10-CM

## 2016-06-18 NOTE — Progress Notes (Signed)
Thank you :)

## 2016-06-18 NOTE — Progress Notes (Signed)
EPIC Encounter for ICM Monitoring  Patient Name: Kevin [name] is a 70 y.o. male Date: 06/18/2016 Primary Care Physican: [name], MD Primary Cardiologist: [name] Electrophysiologist: [name] Dry Weight: 190 lb  Bi-V Pacing:  100%       Heart Failure questions reviewed, pt asymptomatic.   Thoracic impedance abnormal suggesting fluid accumulation 05/16/2016 to 05/30/2016 and slightly below baseline 06/18/2016.  Recommendations: No changes.  Discussed at length regarding low sodium diet  ICM trend: 06/18/2016     Follow-up plan: ICM clinic phone appointment on 07/19/2016.  Copy of ICM check sent to primary cardiologist and device physician.   Rosalene Billings, RN 06/18/2016 9:36 AM

## 2016-06-18 NOTE — Telephone Encounter (Signed)
Call to patient and requested to send ICM remote transmission today and he will send it in the next hour.

## 2016-06-22 ENCOUNTER — Other Ambulatory Visit: Payer: Self-pay | Admitting: Endocrinology

## 2016-06-25 ENCOUNTER — Telehealth: Payer: Self-pay | Admitting: Family Medicine

## 2016-06-25 NOTE — Telephone Encounter (Signed)
Pt returning call not sure who or what the call in reference to.

## 2016-06-29 NOTE — Telephone Encounter (Signed)
Left pt a detailed message letting him know that I was not aware of anyone calling him. I did let him know he can call back with any concerns or questions.

## 2016-07-03 ENCOUNTER — Other Ambulatory Visit: Payer: Self-pay | Admitting: Endocrinology

## 2016-07-03 DIAGNOSIS — Z23 Encounter for immunization: Secondary | ICD-10-CM | POA: Diagnosis not present

## 2016-07-05 DIAGNOSIS — R69 Illness, unspecified: Secondary | ICD-10-CM | POA: Diagnosis not present

## 2016-07-19 ENCOUNTER — Ambulatory Visit (INDEPENDENT_AMBULATORY_CARE_PROVIDER_SITE_OTHER): Payer: Medicare HMO | Admitting: *Deleted

## 2016-07-19 ENCOUNTER — Telehealth: Payer: Self-pay | Admitting: Cardiology

## 2016-07-19 DIAGNOSIS — Z4502 Encounter for adjustment and management of automatic implantable cardiac defibrillator: Secondary | ICD-10-CM

## 2016-07-19 DIAGNOSIS — I5022 Chronic systolic (congestive) heart failure: Secondary | ICD-10-CM

## 2016-07-19 DIAGNOSIS — Z0389 Encounter for observation for other suspected diseases and conditions ruled out: Secondary | ICD-10-CM | POA: Diagnosis not present

## 2016-07-19 DIAGNOSIS — I428 Other cardiomyopathies: Secondary | ICD-10-CM

## 2016-07-19 NOTE — Progress Notes (Signed)
Remote ICD transmission.   

## 2016-07-19 NOTE — Telephone Encounter (Signed)
Spoke with pt and reminded pt of remote transmission that is due today. Pt verbalized understanding.   

## 2016-07-20 NOTE — Progress Notes (Signed)
EPIC Encounter for ICM Monitoring  Patient Name: Kevin [name] is a 70 y.o. male Date: 07/20/2016 Primary Care Physican: [name], MD Primary Cardiologist: [name] Electrophysiologist: [name] Dry Weight: 190 lb  Bi-V Pacing:  100%       Heart Failure questions reviewed, pt asymptomatic   Thoracic impedance abnormal suggesting fluid accumulation 07/09/2016 to 07/19/2016.  LABS: 06/08/2016 Creatinine 1.14, BUN 18, Potassium 4.1, Sodium 134 04/16/2016 Creatinine 0.96, BUN 12, Potassium 4.4, Sodium 137 08/22/2015 Creatinine 0.98, BUN 16, Potassium 4.4, Sodium 138  Recommendations:  Increase Furosemide 40 mg to bid x 2 days since impedance is starting to return to baseline.  Return to prescribed dosage of Furosemide 40 mg daily.     Follow-up plan: ICM clinic phone appointment on 07/23/2016 to recheck fluid levels.  Copy of ICM check sent to device physician.   ICM trend: 07/19/2016       Rosalene Billings, RN 07/20/2016 10:05 AM

## 2016-07-21 LAB — CUP PACEART REMOTE DEVICE CHECK
Brady Statistic AP VP Percent: 0.07 %
Brady Statistic AP VS Percent: 0.01 %
Brady Statistic AS VP Percent: 99.9 %
Brady Statistic AS VS Percent: 0.02 %
Brady Statistic RA Percent Paced: 0.08 %
Brady Statistic RV Percent Paced: 99.95 %
Date Time Interrogation Session: 20170911185116
HighPow Impedance: 77 Ohm
Implantable Lead Implant Date: 20130123
Implantable Lead Implant Date: 20160316
Implantable Lead Location: 753859
Implantable Lead Location: 753860
Implantable Lead Model: 6935
Lead Channel Impedance Value: 418 Ohm
Lead Channel Impedance Value: 494 Ohm
Lead Channel Impedance Value: 551 Ohm
Lead Channel Impedance Value: 570 Ohm
Lead Channel Impedance Value: 684 Ohm
Lead Channel Impedance Value: 912 Ohm
Lead Channel Pacing Threshold Amplitude: 0.5 V
Lead Channel Pacing Threshold Pulse Width: 0.4 ms
Lead Channel Pacing Threshold Pulse Width: 0.4 ms
Lead Channel Sensing Intrinsic Amplitude: 12 mV
Lead Channel Sensing Intrinsic Amplitude: 12 mV
Lead Channel Sensing Intrinsic Amplitude: 3.875 mV
Lead Channel Setting Pacing Amplitude: 2 V
Lead Channel Setting Pacing Amplitude: 3.75 V
Lead Channel Setting Pacing Pulse Width: 1 ms
Lead Channel Setting Sensing Sensitivity: 0.3 mV
MDC IDC LEAD IMPLANT DT: 20160316
MDC IDC LEAD LOCATION: 753858
MDC IDC LEAD MODEL: 4598
MDC IDC MSMT BATTERY REMAINING LONGEVITY: 44 mo
MDC IDC MSMT BATTERY VOLTAGE: 2.95 V
MDC IDC MSMT LEADCHNL LV IMPEDANCE VALUE: 1045 Ohm
MDC IDC MSMT LEADCHNL LV IMPEDANCE VALUE: 1064 Ohm
MDC IDC MSMT LEADCHNL LV IMPEDANCE VALUE: 1083 Ohm
MDC IDC MSMT LEADCHNL LV IMPEDANCE VALUE: 646 Ohm
MDC IDC MSMT LEADCHNL LV IMPEDANCE VALUE: 912 Ohm
MDC IDC MSMT LEADCHNL LV PACING THRESHOLD AMPLITUDE: 3.25 V
MDC IDC MSMT LEADCHNL LV PACING THRESHOLD PULSEWIDTH: 1 ms
MDC IDC MSMT LEADCHNL RA PACING THRESHOLD AMPLITUDE: 0.5 V
MDC IDC MSMT LEADCHNL RA SENSING INTR AMPL: 3.875 mV
MDC IDC MSMT LEADCHNL RV IMPEDANCE VALUE: 418 Ohm
MDC IDC MSMT LEADCHNL RV IMPEDANCE VALUE: 513 Ohm
MDC IDC SET LEADCHNL RV PACING AMPLITUDE: 2.5 V
MDC IDC SET LEADCHNL RV PACING PULSEWIDTH: 0.4 ms

## 2016-07-22 ENCOUNTER — Encounter: Payer: Self-pay | Admitting: Cardiology

## 2016-07-23 ENCOUNTER — Ambulatory Visit (INDEPENDENT_AMBULATORY_CARE_PROVIDER_SITE_OTHER): Payer: Medicare HMO

## 2016-07-23 DIAGNOSIS — Z4502 Encounter for adjustment and management of automatic implantable cardiac defibrillator: Secondary | ICD-10-CM

## 2016-07-23 DIAGNOSIS — Z0389 Encounter for observation for other suspected diseases and conditions ruled out: Secondary | ICD-10-CM

## 2016-07-23 DIAGNOSIS — I5022 Chronic systolic (congestive) heart failure: Secondary | ICD-10-CM

## 2016-07-23 NOTE — Progress Notes (Signed)
Thank you :)

## 2016-07-23 NOTE — Progress Notes (Signed)
EPIC Encounter for ICM Monitoring  Patient Name: Kevin [name] is a 70 y.o. male Date: 07/23/2016 Primary Care Physican: [name], MD Primary Cardiologist: [name] Electrophysiologist: [name] Dry Weight: 190 lb  Bi-V Pacing:  100%       Heart Failure questions reviewed, pt asymptomatic   Thoracic impedance returned to normal for 1 day after taking increased Furosemide dosage on 07/20/2016 x 2 days but is trending below baseline again.   LABS: 06/08/2016 Creatinine 1.14, BUN 18, Potassium 4.1, Sodium 134 04/16/2016 Creatinine 0.96, BUN 12, Potassium 4.4, Sodium 137 08/22/2015 Creatinine 0.98, BUN 16, Potassium 4.4, Sodium 138  Recommendations:  Advised to read the food labels to determine if he is eating more than 2000 mg salt a day and to limit fluid intake to 64 oz a day.  He stated he does drink a lot of fluid but does not think it is more than 64 oz.      Follow-up plan: ICM clinic phone appointment on 07/29/2016 to recheck levels.  Copy of ICM check sent to Dr Sallyanne Kuster and Dr Rayann Heman for review and any further recommendations.    ICM trend: 07/23/2016       Rosalene Billings, RN 07/23/2016 11:58 AM

## 2016-07-29 ENCOUNTER — Telehealth: Payer: Self-pay | Admitting: Cardiology

## 2016-07-29 ENCOUNTER — Ambulatory Visit (INDEPENDENT_AMBULATORY_CARE_PROVIDER_SITE_OTHER): Payer: Medicare HMO

## 2016-07-29 DIAGNOSIS — I5022 Chronic systolic (congestive) heart failure: Secondary | ICD-10-CM

## 2016-07-29 DIAGNOSIS — Z0389 Encounter for observation for other suspected diseases and conditions ruled out: Secondary | ICD-10-CM

## 2016-07-29 DIAGNOSIS — Z4502 Encounter for adjustment and management of automatic implantable cardiac defibrillator: Secondary | ICD-10-CM

## 2016-07-29 NOTE — Telephone Encounter (Signed)
Spoke with pt and reminded pt of remote transmission that is due today. Pt verbalized understanding.   

## 2016-07-30 ENCOUNTER — Telehealth: Payer: Self-pay

## 2016-07-30 NOTE — Progress Notes (Signed)
Great.  Thanks

## 2016-07-30 NOTE — Progress Notes (Signed)
EPIC Encounter for ICM Monitoring  Patient Name: Kevin [name] is a 69 y.o. male Date: 07/30/2016 Primary Care Physican: [name], MD Primary Cardiologist:[name] Electrophysiologist: [name] Dry Weight:  unknown Bi-V Pacing:  100%       Attempted ICM call and unable to reach. Left detailed message regarding transmission and next ICM transmission date.  Transmission reviewed.   Thoracic impedance returned to normal   Follow-up plan: ICM clinic phone appointment on 08/30/2016.  Copy of ICM check sent to primary cardiologist and device physician to show impedance returned to normal.   ICM trend: 07/30/2016       Rosalene Billings, RN 07/30/2016 11:00 AM

## 2016-07-30 NOTE — Telephone Encounter (Signed)
Remote ICM transmission received.  Attempted patient call and left detailed message regarding transmission and next ICM scheduled for 08/30/2016.  Advised to return call for any fluid symptoms or questions.

## 2016-08-05 ENCOUNTER — Encounter: Payer: Self-pay | Admitting: Cardiology

## 2016-08-09 ENCOUNTER — Other Ambulatory Visit: Payer: Self-pay | Admitting: Family Medicine

## 2016-08-10 ENCOUNTER — Other Ambulatory Visit: Payer: Self-pay | Admitting: Family Medicine

## 2016-08-10 ENCOUNTER — Other Ambulatory Visit: Payer: Self-pay | Admitting: Endocrinology

## 2016-08-30 ENCOUNTER — Ambulatory Visit (INDEPENDENT_AMBULATORY_CARE_PROVIDER_SITE_OTHER): Payer: Medicare HMO

## 2016-08-30 DIAGNOSIS — Z4502 Encounter for adjustment and management of automatic implantable cardiac defibrillator: Secondary | ICD-10-CM | POA: Diagnosis not present

## 2016-08-30 DIAGNOSIS — I5022 Chronic systolic (congestive) heart failure: Secondary | ICD-10-CM | POA: Diagnosis not present

## 2016-08-31 NOTE — Progress Notes (Signed)
EPIC Encounter for ICM Monitoring  Patient Name: JR KOVACICH is a 70 y.o. male Date: 08/31/2016 Primary Care Physican: [name], MD Primary Cardiologist:[name] Electrophysiologist: [name] Dry Weight:     180 lbs Bi-V Pacing: 99.9%                                                          Heart Failure questions reviewed, pt asymptomatic   Thoracic impedance abnormal suggesting fluid accumulation since 08/16/2016.  LABS: 06/08/2016 Creatinine 1.14, BUN 18, Potassium 4.1, Sodium 134 04/16/2016 Creatinine 0.96, BUN 12, Potassium 4.4, Sodium 137 08/22/2015 Creatinine 0.98, BUN 16, Potassium 4.4, Sodium 138  Recommendations:  Increase Furosemide to 40 mg bid x 3 days and return to prescribed dosage of 40 mg 1 tablet daily.   Follow-up plan: ICM clinic phone appointment on 09/06/2016 to recheck fluid levels.  Copy of ICM check sent to primary cardiologist and device physician.   ICM trend: 08/30/2016       Rosalene Billings, RN 08/31/2016 3:05 PM

## 2016-09-01 MED ORDER — SPIRONOLACTONE 25 MG PO TABS: 25.0000 mg | ORAL_TABLET | Freq: Every day | ORAL | 3 refills | Status: DC

## 2016-09-01 NOTE — Addendum Note (Signed)
Addended by: Rosalene Billings on: 09/01/2016 10:38 AM   Modules accepted: Orders

## 2016-09-01 NOTE — Progress Notes (Signed)
Late Entry for 08/31/2016.   Call from wife.  She stated patient is confused about the instructions given regarding increasing one the meds.  Reviewed instructions to increase Furosemide to 40 mg bid x 3 days and return to prescribed dosage of 40 mg 1 tablet daily.   She verbalized understanding.  She stated she is not sure if he is taking the correct dosages of Spironolactone and the Furosemide since September.  She stated patient told her he was to increase Spironolactone 25 mg to 1 tablet daily and to decrease Furosemide 40 mg to 1/2 tablet.  Reviewed Epic notes and there is no changes in patient's medications.  She thinks he may have been confused from the [facility] call in September and gave her the wrong information.  Advised will review with Dr Rayann Heman and call back with recommendations.

## 2016-09-01 NOTE — Progress Notes (Signed)
Discussed with Dr Rayann Heman in the office regarding patient has been taking Spironolactone 25 mg 1 tablet daily instead of prescribed dosage of 1/2 tablet daily and Furosemide 40 mg was decreased to 1/2 tablet instead of prescribed dosage of 1 tablet daily.   Dr Rayann Heman recommended patient to stay on Spironolactone 25 mg 1 tablet daily and Furosemide 40 mg 1 tablet daily and get needs BMET.

## 2016-09-01 NOTE — Progress Notes (Signed)
Call to wife (DPR).  Advised Dr Rayann Heman recommended patient to stay on Spironolactone 25 mg 1 tablet daily and Furosemide 40 mg 1 tablet daily and have BMET drawn.  She reported patient can come tomorrow morning for labs.  She stated no refill is needed of Spironolactone is needed at this time.   She asked if patient should follow instructions to increase Furosemide x 3 days for fluid overloaded and confirmed to take Furosemide 40 mg bid x 3 days and then will return to the prescribed dosage of 40 mg 1 tablet daily.  Advised to stop the additional Furosemide dosage if he becomes weak or dizzy and to call if that occurs. She repeated the instructions and verbalized understanding.   Next remote transmission 09/06/2016

## 2016-09-01 NOTE — Progress Notes (Signed)
Agree, thanks MCr 

## 2016-09-02 ENCOUNTER — Other Ambulatory Visit: Payer: Medicare HMO | Admitting: *Deleted

## 2016-09-02 DIAGNOSIS — I5022 Chronic systolic (congestive) heart failure: Secondary | ICD-10-CM

## 2016-09-02 DIAGNOSIS — R69 Illness, unspecified: Secondary | ICD-10-CM | POA: Diagnosis not present

## 2016-09-02 LAB — BASIC METABOLIC PANEL
BUN: 23 mg/dL (ref 7–25)
CALCIUM: 9.2 mg/dL (ref 8.6–10.3)
CO2: 28 mmol/L (ref 20–31)
CREATININE: 1.15 mg/dL (ref 0.70–1.18)
Chloride: 98 mmol/L (ref 98–110)
GLUCOSE: 152 mg/dL — AB (ref 65–99)
Potassium: 4.3 mmol/L (ref 3.5–5.3)
Sodium: 136 mmol/L (ref 135–146)

## 2016-09-06 ENCOUNTER — Ambulatory Visit (INDEPENDENT_AMBULATORY_CARE_PROVIDER_SITE_OTHER): Payer: Medicare HMO

## 2016-09-06 ENCOUNTER — Other Ambulatory Visit: Payer: Medicare HMO

## 2016-09-06 DIAGNOSIS — I5022 Chronic systolic (congestive) heart failure: Secondary | ICD-10-CM

## 2016-09-06 DIAGNOSIS — Z4502 Encounter for adjustment and management of automatic implantable cardiac defibrillator: Secondary | ICD-10-CM

## 2016-09-06 NOTE — Progress Notes (Signed)
EPIC Encounter for ICM Monitoring  Patient Name: Kevin [name] is a 70 y.o. male Date: 09/06/2016 Primary Care Physican: [name], MD Primary Cardiologist:[name] Electrophysiologist: [name] Dry Weight: 187 lb  Bi-V Pacing:  99.8%       Heart Failure questions reviewed, pt asymptomatic   Thoracic impedance returned to normal after increased Furosemide x 3 days.    Recommendations: No changes.  Patient reported eating at [employer] today and advised restaurant foods are hight in sodium and to limit salt intake to 2000 mg daily.  Encouraged to call for fluid symptoms.    Follow-up plan: ICM clinic phone appointment on 10/07/2016.  Copy of ICM check sent to primary cardiologist and device physician for updated transmission showing fluid levels returned to normal.   ICM trend: 09/06/2016       Rosalene Billings, RN 09/06/2016 3:10 PM

## 2016-09-07 ENCOUNTER — Ambulatory Visit: Payer: Medicare HMO | Admitting: Family Medicine

## 2016-09-07 NOTE — Progress Notes (Signed)
Good. Thanks!

## 2016-09-08 ENCOUNTER — Encounter: Payer: Self-pay | Admitting: Family Medicine

## 2016-09-08 ENCOUNTER — Ambulatory Visit (INDEPENDENT_AMBULATORY_CARE_PROVIDER_SITE_OTHER): Payer: Medicare HMO | Admitting: Family Medicine

## 2016-09-08 VITALS — BP 110/70 | HR 51 | Temp 97.8°F | Ht 75.0 in | Wt 181.2 lb

## 2016-09-08 DIAGNOSIS — R634 Abnormal weight loss: Secondary | ICD-10-CM

## 2016-09-08 NOTE — Patient Instructions (Signed)
Return for labs and go for CXR in next couple of days.

## 2016-09-08 NOTE — Progress Notes (Signed)
Subjective:     Patient ID: Kevin [name], male   DOB: 02-16-1946, 70 y.o.   MRN: [id number]  HPI Patient seen with chief complaint of weight loss. In reviewing records he's lost approximately 20 pounds from last year with weight of 201 pounds last October. His weight was down to 198 pounds in June 2017 and 193 pounds August 2. He states his appetite is fairly good. He does not feel like has changed his diet in any way. He has type 2 diabetes which has been recently well controlled with recent A1c 6.5% through the [place]. He had lab work at the [facility] October 12 with normal thyroid functions.  Chronic problems include history of systolic heart failure, hypertension, history of V. tach, type 2 diabetes, prostate cancer.  Followed closely by urology and has prostate cancer which is contained. He has not had any abdominal pain, headaches, fever, chills, night sweats, adenopathy. His girlfriend thinks he may be slightly depressed but he denies this. He's had occasional dry cough. No hemoptysis. Colonoscopy October 2014 adenomatous polyps with recommended three-year follow-up  Wt Readings from Last 3 Encounters:  09/08/16 181 lb 3.2 oz (82.2 kg)  06/09/16 193 lb (87.5 kg)  04/16/16 198 lb 6.4 oz (90 kg)     Past Medical History:  Diagnosis Date  . Agent orange exposure    in [place]  . Anemia   . Heart block AV second degree    a. s/p PPM implant with subsequent CRTD upgrade  . High cholesterol   . History of colon polyps   . Hypertension   . LBBB (left bundle branch block)   . Nonischemic cardiomyopathy ([place])    a. MDT CRTD upgrade 2016  . Prostate cancer ([place])    "not been treated yet; on a wait and see" (01/22/2015)  . Type II diabetes mellitus ([place])    Past Surgical History:  Procedure Laterality Date  . BASAL CELL CARCINOMA EXCISION     "back, face, neck"  . BI-VENTRICULAR IMPLANTABLE CARDIOVERTER DEFIBRILLATOR UPGRADE N/A 01/22/2015   MDT CRTD upgrade by Dr Rayann Heman  . CARDIAC  CATHETERIZATION  01/27/2012   normal coronaries, dilated LV c/w nonischemic cardiomyopathy  . EXAMINATION UNDER ANESTHESIA  04/06/2012   Procedure: EXAM UNDER ANESTHESIA;  Surgeon: Marcello Moores A. Cornett, MD;  Location: [facility];  Service: General;  Laterality: N/A;  . INCISION AND DRAINAGE PERIRECTAL ABSCESS  ~ 2010; 2015  . INGUINAL HERNIA REPAIR Left 1980  . LEFT HEART CATHETERIZATION WITH CORONARY ANGIOGRAM N/A 01/27/2012   Procedure: LEFT HEART CATHETERIZATION WITH CORONARY ANGIOGRAM;  Surgeon: Troy Sine, MD;  Location: [facility] C[facility] LAB;  Service: Cardiovascular;  Laterality: N/A;  . NM MYOCAR PERF WALL MOTION  01/21/2012   mod-severe perfusion defect due to infarct/scar w/mild perinfarct ischemia in the apical,basal inferoseptal,basal inferior,mid inferoseptal,midinferior and apical inferior regions  . PERMANENT PACEMAKER INSERTION N/A 12/01/2011   Medtronic implanted by Dr Sallyanne Kuster  . PROSTATE BIOPSY    . PROSTATE BIOPSY    . shrapnel surgery     "in [place]"  . TONSILLECTOMY      reports that he quit smoking about 32 years ago. His smoking use included Cigarettes. He has a 20.00 pack-year smoking history. He has never used smokeless tobacco. He reports that he drinks alcohol. He reports that he does not use drugs. family history includes Heart disease in his father; Leukemia in his father; Ovarian cancer in his mother. Allergies  Allergen Reactions  . Sulfa Antibiotics Swelling  Swelling of lips only.     Review of Systems  Constitutional: Positive for unexpected weight change. Negative for appetite change, chills and fever.  Respiratory: Negative for cough and shortness of breath.   Cardiovascular: Negative for chest pain.  Gastrointestinal: Negative for abdominal pain, diarrhea, nausea and vomiting.  Endocrine: Negative for polydipsia and polyuria.  Genitourinary: Negative for dysuria and hematuria.  Neurological: Negative for weakness.  Hematological: Negative for adenopathy.        Objective:   Physical Exam  Constitutional: He appears well-developed and well-nourished.  HENT:  Mouth/Throat: Oropharynx is clear and moist.  Neck: Neck supple. No thyromegaly present.  Cardiovascular: Normal rate.   Pulmonary/Chest: Effort normal and breath sounds normal. No respiratory distress. He has no wheezes. He has no rales.  Abdominal: Soft. Bowel sounds are normal. He exhibits no distension and no mass. There is no tenderness. There is no rebound and no guarding.  Musculoskeletal: He exhibits no edema.  Lymphadenopathy:    He has no cervical adenopathy.  Skin: No rash noted.  Psychiatric: He has a normal mood and affect. His behavior is normal. Judgment and thought content normal.  PH Q-9 score of 4       Assessment:     Patient resents with weight loss of documented 20 pounds over the past year. He relates fairly good appetite. Etiology unclear. Diabetes has been fairly well controlled. Recent thyroid functions normal. Low risk for depression with pH Q-9 score of 4.    Plan:     -Check labs with CBC, sedimentation rate, comprehensive metabolic panel -Obtain PA and lateral chest x-ray -Reassess weight within one month -Made suggestions for healthy supplements -consider CT abdomen and pelvis if still losing at that point.  [name] MD [facility] at [facility]

## 2016-09-09 ENCOUNTER — Other Ambulatory Visit: Payer: Self-pay | Admitting: Endocrinology

## 2016-09-09 ENCOUNTER — Ambulatory Visit (INDEPENDENT_AMBULATORY_CARE_PROVIDER_SITE_OTHER): Payer: Medicare HMO | Admitting: Endocrinology

## 2016-09-09 ENCOUNTER — Encounter: Payer: Self-pay | Admitting: Endocrinology

## 2016-09-09 VITALS — BP 120/82 | HR 76 | Wt 182.8 lb

## 2016-09-09 DIAGNOSIS — E1165 Type 2 diabetes mellitus with hyperglycemia: Secondary | ICD-10-CM

## 2016-09-09 NOTE — Progress Notes (Addendum)
Patient ID: Kevin [name], male   DOB: 12-10-1945, 70 y.o.   MRN: [id number]    Reason for Appointment:  Followup for Type 2 Diabetes   History of Present Illness:          Diagnosis: Type 2 diabetes mellitus, date of diagnosis: 2001        Past history: His previous records are not available but initially his symptoms included weight loss He thinks he has been on metformin for several years and more recently has been taking 1000 mg twice a day [facility] His A1c was 6.9 in 2013 but a year later was 8.1 He had been on glipizide 5 mg twice a day in addition to his metformin 1 g twice a day   Prior to his consultation when his A1c was 10% and he was given Januvia in addition  Recent history:   His A1c has stayed around 7% consistently but because of higher readings on his last visit and some symptoms of neuropathy he was told to increase his Janumet XR to the higher dose of 50/1000, 2 tablets daily  A1c from the [facility] is down 6.5  Current management, blood sugar patterns and problems identified: 1. He has not had any side effects from increasing the Janumet XR 2. His recent blood sugars are variably high in the morning and not clear why he has occasional readings over 200 3.  significantly higher although he is checking somewhat infrequently 4. He says that he is not always consistent with diet and occasionally eating sweets like ice cream 5. More recently because of a left toe injury has not exercised as much as usual but he does usually walk on treadmill regularly  Has been exercising at least 3 times a week, mostly with walking Hypoglycemia: None recently       Oral hypoglycemic drugs the patient is taking are: Glipizide ER 5 mg qd, Janumet XR 100/2000 daily Side effects from medications have been: None  Glucose monitoring:  done 1-2 times a day         Glucometer:  Accu-Chek compact     Blood Glucose  readings  by download  Mean values apply above for all meters except median for One Touch  PRE-MEAL Morning  Lunch Afternoon  Bedtime Overall  Glucose range: 112-242  156  94-160  118    Mean/median:     145    Glycemic control:  A1c on 08/19/16 is 6.5  Lab Results  Component Value Date   HGBA1C 7.2 (H) 06/08/2016   HGBA1C 7.1 (H) 08/22/2015   HGBA1C 7.0 (H) 04/18/2015   Lab Results  Component Value Date   MICROALBUR <0.7 08/22/2015   LDLCALC 53 08/22/2015   CREATININE 1.15 09/02/2016     Self-care: The diet that the patient has been following is: tries to limit portions, may get fried food when eating out, some fast food He is drinking juice periodically especially cranberry juice for his "kidneys"  Meals: 3 meals per day.  breakfast is at 7-8 am and is usually eating a biscuit or special K. cereal Usually eating baked chicken or chicken salad sandwiches for meals and will have fruits for snacks          Exercise:  30-40 minutes, 3-4 days a week on treadmill/stretching         Dietician visit: Most recent:2001,  Compliance with the medical regimen: Good  Retinal exam: Most recent:. 8/16 at [facility].   Weight history:  Wt Readings from Last 3 Encounters:  09/09/16 182 lb 12.8 oz (82.9 kg)  09/08/16 181 lb 3.2 oz (82.2 kg)  06/09/16 193 lb (87.5 kg)    OTHER active problems: See Review of systems  No visits with results within 1 Week(s) from this visit.  Latest known visit with results is:  Lab on 09/02/2016  Component Date Value Ref Range Status  . Sodium 09/02/2016 136  135 - 146 mmol/L Final  . Potassium 09/02/2016 4.3  3.5 - 5.3 mmol/L Final  . Chloride 09/02/2016 98  98 - 110 mmol/L Final  . CO2 09/02/2016 28  20 - 31 mmol/L Final  . Glucose, Bld 09/02/2016 152* 65 - 99 mg/dL Final  . BUN 09/02/2016 23  7 - 25 mg/dL Final  . Creat 09/02/2016 1.15  0.70 - 1.18 mg/dL Final   Comment:   For patients > or = 70 years of age: The upper reference  limit for Creatinine is approximately 13% higher for people identified as African-American.     . Calcium 09/02/2016 9.2  8.6 - 10.3 mg/dL Final      Medication List       Accurate as of 09/09/16 10:23 AM. Always use your most recent med list.          ACCU-CHEK COMPACT PLUS test strip Generic drug:  glucose blood use 1 TEST STRIP once daily as directed   aspirin 81 MG tablet Take 81 mg by mouth daily.   atorvastatin 80 MG tablet Commonly known as:  LIPITOR Take 40 mg by mouth daily at 6 PM.   buPROPion 150 MG 24 hr tablet Commonly known as:  WELLBUTRIN XL TAKE 1 TABLET BY MOUTH EVERY DAY   carvedilol 12.5 MG tablet Commonly known as:  COREG TAKE ONE TABLET BY MOUTH TWICE DAILY WITH A MEAL   clobetasol ointment 0.05 % Commonly known as:  TEMOVATE Apply 1 application topically daily as needed (itching).   CoQ-10 200 MG Caps Take by mouth.   furosemide 40 MG tablet Commonly known as:  LASIX TAKE ONE TABLET BY MOUTH EVERY DAY WITH BREAKFAST   gabapentin 100 MG capsule Commonly known as:  NEURONTIN Take 1 capsule (100 mg total) by mouth 3 (three) times daily.   glipiZIDE 5 MG 24 hr tablet Commonly known as:  GLUCOTROL XL TAKE 1 TABLET BY MOUTH EVERY DAY WITH BREAKFAST   JANUMET XR 50-1000 MG Tb24 Generic drug:  SitaGLIPtin-MetFORMIN HCl TAKE 2 TABLETS BY MOUTH EVERY DAY   latanoprost 0.005 % ophthalmic solution Commonly known as:  XALATAN Place 1 drop into the right eye daily.   losartan 100 MG tablet Commonly known as:  COZAAR Take 50 mg by mouth daily.   spironolactone 25 MG tablet Commonly known as:  ALDACTONE Take 1 tablet (25 mg total) by mouth daily.   VIAGRA 100 MG tablet Generic drug:  sildenafil TAKE ONE TABLET BY MOUTH AS NEEDED FOR ERECTILE DYSFUNCTION.       Allergies:  Allergies  Allergen Reactions  . Sulfa Antibiotics Swelling    Swelling of lips only.    Past Medical History:  Diagnosis Date  . Agent orange exposure     in [place]  . Anemia   . Heart block AV second degree    a. s/p PPM implant with subsequent CRTD upgrade  . High cholesterol   . History  of colon polyps   . Hypertension   . LBBB (left bundle branch block)   . Nonischemic cardiomyopathy ([place])    a. MDT CRTD upgrade 2016  . Prostate cancer ([place])    "not been treated yet; on a wait and see" (01/22/2015)  . Type II diabetes mellitus ([place])     Past Surgical History:  Procedure Laterality Date  . BASAL CELL CARCINOMA EXCISION     "back, face, neck"  . BI-VENTRICULAR IMPLANTABLE CARDIOVERTER DEFIBRILLATOR UPGRADE N/A 01/22/2015   MDT CRTD upgrade by Dr Rayann Heman  . CARDIAC CATHETERIZATION  01/27/2012   normal coronaries, dilated LV c/w nonischemic cardiomyopathy  . EXAMINATION UNDER ANESTHESIA  04/06/2012   Procedure: EXAM UNDER ANESTHESIA;  Surgeon: Marcello Moores A. Cornett, MD;  Location: [facility];  Service: General;  Laterality: N/A;  . INCISION AND DRAINAGE PERIRECTAL ABSCESS  ~ 2010; 2015  . INGUINAL HERNIA REPAIR Left 1980  . LEFT HEART CATHETERIZATION WITH CORONARY ANGIOGRAM N/A 01/27/2012   Procedure: LEFT HEART CATHETERIZATION WITH CORONARY ANGIOGRAM;  Surgeon: Troy Sine, MD;  Location[facility] C[facility] LAB;  Service: Cardiovascular;  Laterality: N/A;  . NM MYOCAR PERF WALL MOTION  01/21/2012   mod-severe perfusion defect due to infarct/scar w/mild perinfarct ischemia in the apical,basal inferoseptal,basal inferior,mid inferoseptal,midinferior and apical inferior regions  . PERMANENT PACEMAKER INSERTION N/A 12/01/2011   Medtronic implanted by Dr Sallyanne Kuster  . PROSTATE BIOPSY    . PROSTATE BIOPSY    . shrapnel surgery     "in [place]"  . TONSILLECTOMY      Family History  Problem Relation Age of Onset  . Ovarian cancer Mother   . Leukemia Father   . Heart disease Father   . Diabetes Neg Hx     Social History:  reports that he quit smoking about 32 years ago. His smoking use included Cigarettes. He has a 20.00 pack-year smoking history. He  has never used smokeless tobacco. He reports that he drinks alcohol. He reports that he does not use drugs.    Review of Systems   He was given gabapentin on his last visit because of a sensation of burning in his left foot in the middle and instead Symptoms are better  His PCP is evaluating him for significant weight loss especially recently .   He has eye exams every 6 months regularly at the [facility] but no records available      Lipids: He has been treated with Lipitor 80 mg And also followed by [facility], LDL is relatively low, only 32 done at the [place] in October 2017       Lab Results  Component Value Date   CHOL 102 08/22/2015   HDL 30.90 (L) 08/22/2015   LDLCALC 53 08/22/2015   LDLDIRECT 58.6 09/24/2014   TRIG 92.0 08/22/2015   CHOLHDL 3 08/22/2015     Hypertension: Mild and well controlled with losartan, Aldactone low-dose and carvedilol  Also taking Lasix and followed by cardiologist      Physical Examination:  BP 120/82   Pulse 76   Wt 182 lb 12.8 oz (82.9 kg)   SpO2 96%   BMI 22.85 kg/m   ASSESSMENT/PLAN:  Diabetes type 2, Nonobese  See history of present illness for detailed discussion of current diabetes management, blood sugar patterns and problems identified     His A1c is improved at 6.5 done through the [facility] This is likely from increasing his Janumet but also his weight loss He has some high  readings in the morning hours and may be after breakfast He has difficulty remembering when he checks his blood sugars and mealtimes are variable Blood sugars are not high in the afternoon and has only one reading at bedtime He is fairly active  Currently will continue his Janumet XR Encouraged him to check more readings after meals  Weight loss: To be evaluated by PCP.  He has taken metformin and variable doses for several years and most likely this is not causing his significant weight loss.  However if no etiology found may  consider switching metformin to Actos  HYPERLIPIDEMIA:  well controlled except for low HDL Followed by cardiologist and PCP   Neuropathy: He has relief of the burning sensation in his feet   Kevin [name] 09/09/2016, 10:23 AM      Note: This office note was prepared with Dragon voice recognition system technology. Any transcriptional errors that result from this process are unintentional.

## 2016-09-09 NOTE — Patient Instructions (Signed)
Take Gabapentin as needed for foot burning  Check blood sugars on waking up  3x weekly  Also check blood sugars about 2 hours after a meal and do this after different meals by rotation  Recommended blood sugar levels on waking up is 90-130 and about 2 hours after meal is 130-160  Please bring your blood sugar monitor to each visit, thank you

## 2016-09-10 ENCOUNTER — Other Ambulatory Visit (INDEPENDENT_AMBULATORY_CARE_PROVIDER_SITE_OTHER): Payer: Medicare HMO

## 2016-09-10 DIAGNOSIS — R634 Abnormal weight loss: Secondary | ICD-10-CM

## 2016-09-10 LAB — COMPREHENSIVE METABOLIC PANEL
ALBUMIN: 4.5 g/dL (ref 3.5–5.2)
ALK PHOS: 66 U/L (ref 39–117)
ALT: 7 U/L (ref 0–53)
AST: 14 U/L (ref 0–37)
BUN: 25 mg/dL — ABNORMAL HIGH (ref 6–23)
CALCIUM: 9.7 mg/dL (ref 8.4–10.5)
CO2: 26 mEq/L (ref 19–32)
Chloride: 101 mEq/L (ref 96–112)
Creatinine, Ser: 1.07 mg/dL (ref 0.40–1.50)
GFR: 72.45 mL/min (ref >60.00)
Glucose, Bld: 168 mg/dL — ABNORMAL HIGH (ref 70–99)
POTASSIUM: 4.4 meq/L (ref 3.5–5.1)
Sodium: 135 mEq/L (ref 135–145)
TOTAL PROTEIN: 7.4 g/dL (ref 6.0–8.3)
Total Bilirubin: 0.8 mg/dL (ref 0.2–1.2)

## 2016-09-10 LAB — CBC WITH DIFFERENTIAL/PLATELET
Basophils Absolute: 0 10*3/uL (ref 0.0–0.1)
Basophils Relative: 0.4 % (ref 0.0–3.0)
EOS PCT: 2.2 % (ref 0.0–5.0)
Eosinophils Absolute: 0.2 10*3/uL (ref 0.0–0.7)
HCT: 44 % (ref 39.0–52.0)
HEMOGLOBIN: 15.3 g/dL (ref 13.0–17.0)
LYMPHS ABS: 1.6 10*3/uL (ref 0.7–4.0)
Lymphocytes Relative: 19 % (ref 12.0–46.0)
MCHC: 34.7 g/dL (ref 30.0–36.0)
MCV: 95.4 fl (ref 78.0–100.0)
MONOS PCT: 8 % (ref 3.0–12.0)
Monocytes Absolute: 0.7 10*3/uL (ref 0.1–1.0)
Neutro Abs: 6.1 10*3/uL (ref 1.4–7.7)
Neutrophils Relative %: 70.4 % (ref 43.0–77.0)
Platelets: 205 10*3/uL (ref 150.0–400.0)
RBC: 4.61 Mil/uL (ref 4.22–5.81)
RDW: 13.2 % (ref 11.5–15.5)
WBC: 8.7 10*3/uL (ref 4.0–10.5)

## 2016-09-10 LAB — SEDIMENTATION RATE: SED RATE: 17 mm/h (ref 0–20)

## 2016-09-11 ENCOUNTER — Other Ambulatory Visit: Payer: Self-pay | Admitting: Family Medicine

## 2016-09-13 ENCOUNTER — Ambulatory Visit (INDEPENDENT_AMBULATORY_CARE_PROVIDER_SITE_OTHER)
Admission: RE | Admit: 2016-09-13 | Discharge: 2016-09-13 | Disposition: A | Discharge status: Home / Self Care | Payer: Medicare HMO | Source: Ambulatory Visit | Attending: Family Medicine | Admitting: Family Medicine

## 2016-09-13 DIAGNOSIS — J984 Other disorders of lung: Secondary | ICD-10-CM | POA: Diagnosis not present

## 2016-09-13 DIAGNOSIS — R634 Abnormal weight loss: Secondary | ICD-10-CM | POA: Diagnosis not present

## 2016-09-17 ENCOUNTER — Ambulatory Visit: Payer: Medicare HMO
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
## 2016-09-21 ENCOUNTER — Ambulatory Visit: Payer: Medicare HMO

## 2016-09-21 ENCOUNTER — Ambulatory Visit (INDEPENDENT_AMBULATORY_CARE_PROVIDER_SITE_OTHER): Payer: Medicare HMO

## 2016-09-21 VITALS — BP 110/60 | Ht 75.0 in | Wt 184.2 lb

## 2016-09-21 DIAGNOSIS — Z0000 Encounter for general adult medical examination without abnormal findings: Secondary | ICD-10-CM

## 2016-09-21 NOTE — Patient Instructions (Addendum)
Kevin [name] , Thank you for taking time to come for your Medicare Wellness Visit. I appreciate your ongoing commitment to your health goals. Please review the following plan we discussed and let me know if I can assist you in the future.   Will check on will to see if your HCPOA is up to date  Check with GI; Dr. Sharlett Iles about your colonoscopy and fup    Will have amy check to see if you have had [name] pneumonia vaccines Pneumovax(23 strains) and prevnar which is 13;    These are the goals we discussed: Goals    . patient          Will check on point of entry for older adults to serve on boards or other work  Will consider working more days at [employer]; [phone] [address]; [phone] Get resource to get information on any and all community programs for [employer]: [phone]   Deaf & Hard of Hearing Division Services - can assist with hearing aid x 1  No reviews  Kevin [name]  [address]  [phone]          This is a list of the screening recommended for you and due dates:  Health Maintenance  Topic Date Due  .  Hepatitis C: One time screening is recommended by Center for Disease Control  (CDC) for  adults born from [birth year] through 1965.   1946/09/27  . Eye exam for diabetics  11/22/1955  . Pneumonia vaccines (2 of 2 - PCV13) 08/09/2015  . Colon Cancer Screening  08/20/2016  . Hemoglobin A1C  12/09/2016  . Complete foot exam   06/09/2017  . Tetanus Vaccine  11/15/2018  . Flu Shot  Completed  . Shingles Vaccine  Addressed    Prevention of falls: Remove rugs or any tripping hazards in the home Use Non slip mats in bathtubs and showers Placing grab bars next to the toilet and or shower Placing handrails on [name] sides of the stair way Adding extra lighting in the home.   Personal safety issues reviewed:  1. Consider starting a community watch program per [facility] 2.  Changes batteries is  smoke detector and/or carbon monoxide detector  3.  If you have firearms; keep them in a safe place 4.  Wear protection when in the sun; Always wear sunscreen or a hat; It is good to have your doctor check your skin annually or review any new areas of concern 5. Driving safety; Keep in the right lane; stay 3 car lengths behind the car in front of you on the highway; look 3 times prior to pulling out; carry your cell phone everywhere you go!   Learn about the Yellow Dot program:  The program allows first responders at your emergency to have access to who your physician is, as well as your medications and medical conditions.  Citizens requesting the Yellow Dot Packages should contact Master Corporal Nunzio Cobbs at the [facility] [phone] for the first week of the program and beginning the week after Easter citizens should contact their Scientist, physiological.        Colonoscopy, Adult A colonoscopy is an exam to look at the entire large intestine. During the exam, a lubricated, bendable tube is inserted into the anus and then passed into the rectum, colon, and other parts of the large intestine. A colonoscopy is often done as a  part of normal colorectal screening or in response to certain symptoms, such as anemia, persistent diarrhea, abdominal pain, and blood in the stool. The exam can help screen for and diagnose medical problems, including:  Tumors.  Polyps.  Inflammation.  Areas of bleeding. Tell a health care provider about:  Any allergies you have.  All medicines you are taking, including vitamins, herbs, eye drops, creams, and over-the-counter medicines.  Any problems you or family members have had with anesthetic medicines.  Any blood disorders you have.  Any surgeries you have had.  Any medical conditions you have.  Any problems you have had passing stool. What are the risks? Generally, this is a safe procedure. However, problems may  occur, including:  Bleeding.  A tear in the intestine.  A reaction to medicines given during the exam.  Infection (rare). What happens before the procedure? Eating and drinking restrictions  Follow instructions from your health care provider about eating and drinking, which may include:  A few days before the procedure - follow a low-fiber diet. Avoid nuts, seeds, dried fruit, raw fruits, and vegetables.  1-3 days before the procedure - follow a clear liquid diet. Drink only clear liquids, such as clear broth or bouillon, black coffee or tea, clear juice, clear soft drinks or sports drinks, gelatin desert, and popsicles. Avoid any liquids that contain red or purple dye.  On the day of the procedure - do not eat or drink anything during the 2 hours before the procedure, or within the time period that your health care provider recommends. Bowel prep  If you were prescribed an oral bowel prep to clean out your colon:  Take it as told by your health care provider. Starting the day before your procedure, you will need to drink a large amount of medicated liquid. The liquid will cause you to have multiple loose stools until your stool is almost clear or light green.  If your skin or anus gets irritated from diarrhea, you may use these to relieve the irritation:  Medicated wipes, such as adult wet wipes with aloe and vitamin E.  A skin soothing-product like petroleum jelly.  If you vomit while drinking the bowel prep, take a break for up to 60 minutes and then begin the bowel prep again. If vomiting continues and you cannot take the bowel prep without vomiting, call your health care provider. General instructions  Ask your health care provider about changing or stopping your regular medicines. This is especially important if you are taking diabetes medicines or blood thinners.  Plan to have someone take you home from the hospital or clinic. What happens during the procedure?  An IV tube  may be inserted into one of your veins.  You will be given medicine to help you relax (sedative).  To reduce your risk of infection:  Your health care team will wash or sanitize their hands.  Your anal area will be washed with soap.  You will be asked to lie on your side with your knees bent.  Your health care provider will lubricate a long, thin, flexible tube. The tube will have a camera and a light on the end.  The tube will be inserted into your anus.  The tube will be gently eased through your rectum and colon.  Air will be delivered into your colon to keep it open. You may feel some pressure or cramping.  The camera will be used to take images during the procedure.  A small tissue  sample may be removed from your body to be examined under a microscope (biopsy). If any potential problems are found, the tissue will be sent to a lab for testing.  If small polyps are found, your health care provider may remove them and have them checked for cancer cells.  The tube that was inserted into your anus will be slowly removed. The procedure may vary among health care providers and hospitals. What happens after the procedure?  Your blood pressure, heart rate, breathing rate, and blood oxygen level will be monitored until the medicines you were given have worn off.  Do not drive for 24 hours after the exam.  You may have a small amount of blood in your stool.  You may pass gas and have mild abdominal cramping or bloating due to the air that was used to inflate your colon during the exam.  It is up to you to get the results of your procedure. Ask your health care provider, or the department performing the procedure, when your results will be ready. This information is not intended to replace advice given to you by your health care provider. Make sure you discuss any questions you have with your health care provider. Document Released: 10/22/2000 Document Revised: 05/14/2016 Document  Reviewed: 01/06/2016 Elsevier Interactive Patient Education  2017 [place] Prevention in the Home Introduction Falls can cause injuries. They can happen to people of all ages. There are many things you can do to make your home safe and to help prevent falls. What can I do on the outside of my home?  Regularly fix the edges of walkways and driveways and fix any cracks.  Remove anything that might make you trip as you walk through a door, such as a raised step or threshold.  Trim any bushes or trees on the path to your home.  Use bright outdoor lighting.  Clear any walking paths of anything that might make someone trip, such as rocks or tools.  Regularly check to see if handrails are loose or broken. Make sure that [name] sides of any steps have handrails.  Any raised decks and porches should have guardrails on the edges.  Have any leaves, snow, or ice cleared regularly.  Use sand or salt on walking paths during winter.  Clean up any spills in your garage right away. This includes oil or grease spills. What can I do in the bathroom?  Use night lights.  Install grab bars by the toilet and in the tub and shower. Do not use towel bars as grab bars.  Use non-skid mats or decals in the tub or shower.  If you need to sit down in the shower, use a plastic, non-slip stool.  Keep the floor dry. Clean up any water that spills on the floor as soon as it happens.  Remove soap buildup in the tub or shower regularly.  Attach bath mats securely with double-sided non-slip rug tape.  Do not have throw rugs and other things on the floor that can make you trip. What can I do in the bedroom?  Use night lights.  Make sure that you have a light by your bed that is easy to reach.  Do not use any sheets or blankets that are too big for your bed. They should not hang down onto the floor.  Have a firm chair that has side arms. You can use this for support while you get  dressed.  Do not have throw rugs  and other things on the floor that can make you trip. What can I do in the kitchen?  Clean up any spills right away.  Avoid walking on wet floors.  Keep items that you use a lot in easy-to-reach places.  If you need to reach something above you, use a strong step stool that has a grab bar.  Keep electrical cords out of the way.  Do not use floor polish or wax that makes floors slippery. If you must use wax, use non-skid floor wax.  Do not have throw rugs and other things on the floor that can make you trip. What can I do with my stairs?  Do not leave any items on the stairs.  Make sure that there are handrails on [name] sides of the stairs and use them. Fix handrails that are broken or loose. Make sure that handrails are as long as the stairways.  Check any carpeting to make sure that it is firmly attached to the stairs. Fix any carpet that is loose or worn.  Avoid having throw rugs at the top or bottom of the stairs. If you do have throw rugs, attach them to the floor with carpet tape.  Make sure that you have a light switch at the top of the stairs and the bottom of the stairs. If you do not have them, ask someone to add them for you. What else can I do to help prevent falls?  Wear shoes that:  Do not have high heels.  Have rubber bottoms.  Are comfortable and fit you well.  Are closed at the toe. Do not wear sandals.  If you use a stepladder:  Make sure that it is fully opened. Do not climb a closed stepladder.  Make sure that [name] sides of the stepladder are locked into place.  Ask someone to hold it for you, if possible.  Clearly mark and make sure that you can see:  Any grab bars or handrails.  First and last steps.  Where the edge of each step is.  Use tools that help you move around (mobility aids) if they are needed. These include:  Canes.  Walkers.  Scooters.  Crutches.  Turn on the lights when you go into a  dark area. Replace any light bulbs as soon as they burn out.  Set up your furniture so you have a clear path. Avoid moving your furniture around.  If any of your floors are uneven, fix them.  If there are any pets around you, be aware of where they are.  Review your medicines with your doctor. Some medicines can make you feel dizzy. This can increase your chance of falling. Ask your doctor what other things that you can do to help prevent falls. This information is not intended to replace advice given to you by your health care provider. Make sure you discuss any questions you have with your health care provider. Document Released: 08/21/2009 Document Revised: 04/01/2016 Document Reviewed: 11/29/2014  2017 Elsevier  Health Maintenance, Male A healthy lifestyle and preventative care can promote health and wellness.  Maintain regular health, dental, and eye exams.  Eat a healthy diet. Foods like vegetables, fruits, whole grains, low-fat dairy products, and lean protein foods contain the nutrients you need and are low in calories. Decrease your intake of foods high in solid fats, added sugars, and salt. Get information about a proper diet from your health care provider, if necessary.  Regular physical exercise is one of the  most important things you can do for your health. Most adults should get at least 150 minutes of moderate-intensity exercise (any activity that increases your heart rate and causes you to sweat) each week. In addition, most adults need muscle-strengthening exercises on 2 or more days a week.   Maintain a healthy weight. The body mass index (BMI) is a screening tool to identify possible weight problems. It provides an estimate of body fat based on height and weight. Your health care provider can find your BMI and can help you achieve or maintain a healthy weight. For males 20 years and older:  A BMI below 18.5 is considered underweight.  A BMI of 18.5 to 24.9 is  normal.  A BMI of 25 to 29.9 is considered overweight.  A BMI of 30 and above is considered obese.  Maintain normal blood lipids and cholesterol by exercising and minimizing your intake of saturated fat. Eat a balanced diet with plenty of fruits and vegetables. Blood tests for lipids and cholesterol should begin at age 25 and be repeated every 5 years. If your lipid or cholesterol levels are high, you are over age 63, or you are at high risk for heart disease, you may need your cholesterol levels checked more frequently.Ongoing high lipid and cholesterol levels should be treated with medicines if diet and exercise are not working.  If you smoke, find out from your health care provider how to quit. If you do not use tobacco, do not start.  Lung cancer screening is recommended for adults aged 46-80 years who are at high risk for developing lung cancer because of a history of smoking. A yearly low-dose CT scan of the lungs is recommended for people who have at least a 30-pack-year history of smoking and are current smokers or have quit within the past 15 years. A pack year of smoking is smoking an average of 1 pack of cigarettes a day for 1 year (for example, a 30-pack-year history of smoking could mean smoking 1 pack a day for 30 years or 2 packs a day for 15 years). Yearly screening should continue until the smoker has stopped smoking for at least 15 years. Yearly screening should be stopped for people who develop a health problem that would prevent them from having lung cancer treatment.  If you choose to drink alcohol, do not have more than 2 drinks per day. One drink is considered to be 12 oz (360 mL) of beer, 5 oz (150 mL) of wine, or 1.5 oz (45 mL) of liquor.  Avoid the use of street drugs. Do not share needles with anyone. Ask for help if you need support or instructions about stopping the use of drugs.  High blood pressure causes heart disease and increases the risk of stroke. High blood  pressure is more likely to develop in:  People who have blood pressure in the end of the normal range (100-139/85-89 mm Hg).  People who are overweight or obese.  People who are African American.  If you are 53-64 years of age, have your blood pressure checked every 3-5 years. If you are 43 years of age or older, have your blood pressure checked every year. You should have your blood pressure measured twice-once when you are at a hospital or clinic, and once when you are not at a hospital or clinic. Record the average of the two measurements. To check your blood pressure when you are not at a hospital or clinic, you can use:  An automated blood pressure machine at a pharmacy.  A home blood pressure monitor.  If you are 84-76 years old, ask your health care provider if you should take aspirin to prevent heart disease.  Diabetes screening involves taking a blood sample to check your fasting blood sugar level. This should be done once every 3 years after age 50 if you are at a normal weight and without risk factors for diabetes. Testing should be considered at a younger age or be carried out more frequently if you are overweight and have at least 1 risk factor for diabetes.  Colorectal cancer can be detected and often prevented. Most routine colorectal cancer screening begins at the age of 55 and continues through age 33. However, your health care provider may recommend screening at an earlier age if you have risk factors for colon cancer. On a yearly basis, your health care provider may provide home test kits to check for hidden blood in the stool. A small camera at the end of a tube may be used to directly examine the colon (sigmoidoscopy or colonoscopy) to detect the earliest forms of colorectal cancer. Talk to your health care provider about this at age 58 when routine screening begins. A direct exam of the colon should be repeated every 5-10 years through age 57, unless early forms of  precancerous polyps or small growths are found.  People who are at an increased risk for hepatitis B should be screened for this virus. You are considered at high risk for hepatitis B if:  You were born in a country where hepatitis B occurs often. Talk with your health care provider about which countries are considered high risk.  Your parents were born in a high-risk country and you have not received a shot to protect against hepatitis B (hepatitis B vaccine).  You have HIV or AIDS.  You use needles to inject street drugs.  You live with, or have sex with, someone who has hepatitis B.  You are a man who has sex with other men (MSM).  You get hemodialysis treatment.  You take certain medicines for conditions like cancer, organ transplantation, and autoimmune conditions.  Hepatitis C blood testing is recommended for all people born from [birth year] through 1965 and any individual with known risk factors for hepatitis C.  Healthy men should no longer receive prostate-specific antigen (PSA) blood tests as part of routine cancer screening. Talk to your health care provider about prostate cancer screening.  Testicular cancer screening is not recommended for adolescents or adult males who have no symptoms. Screening includes self-exam, a health care provider exam, and other screening tests. Consult with your health care provider about any symptoms you have or any concerns you have about testicular cancer.  Practice safe sex. Use condoms and avoid high-risk sexual practices to reduce the spread of sexually transmitted infections (STIs).  You should be screened for STIs, including gonorrhea and chlamydia if:  You are sexually active and are younger than 24 years.  You are older than 24 years, and your health care provider tells you that you are at risk for this type of infection.  Your sexual activity has changed since you were last screened, and you are at an increased risk for chlamydia or  gonorrhea. Ask your health care provider if you are at risk.  If you are at risk of being infected with HIV, it is recommended that you take a prescription medicine daily to prevent HIV infection. This is called pre-exposure  prophylaxis (PrEP). You are considered at risk if:  You are a man who has sex with other men (MSM).  You are a heterosexual man who is sexually active with multiple partners.  You take drugs by injection.  You are sexually active with a partner who has HIV.  Talk with your health care provider about whether you are at high risk of being infected with HIV. If you choose to begin PrEP, you should first be tested for HIV. You should then be tested every 3 months for as long as you are taking PrEP.  Use sunscreen. Apply sunscreen liberally and repeatedly throughout the day. You should seek shade when your shadow is shorter than you. Protect yourself by wearing long sleeves, pants, a wide-brimmed hat, and sunglasses year round whenever you are outdoors.  Tell your health care provider of new moles or changes in moles, especially if there is a change in shape or color. Also, tell your health care provider if a mole is larger than the size of a pencil eraser.  A one-time screening for abdominal aortic aneurysm (AAA) and surgical repair of large AAAs by ultrasound is recommended for men aged 56-75 years who are current or former smokers.  Stay current with your vaccines (immunizations). This information is not intended to replace advice given to you by your health care provider. Make sure you discuss any questions you have with your health care provider. Document Released: 04/22/2008 Document Revised: 11/15/2014 Document Reviewed: 07/29/2015 Elsevier Interactive Patient Education  2017 [employer].

## 2016-10-04 ENCOUNTER — Other Ambulatory Visit: Payer: Self-pay | Admitting: Family Medicine

## 2016-10-04 ENCOUNTER — Other Ambulatory Visit: Payer: Self-pay | Admitting: Cardiovascular Disease

## 2016-10-04 ENCOUNTER — Other Ambulatory Visit: Payer: Self-pay | Admitting: Internal Medicine

## 2016-10-04 NOTE — Telephone Encounter (Signed)
REFILL 

## 2016-10-07 ENCOUNTER — Ambulatory Visit (INDEPENDENT_AMBULATORY_CARE_PROVIDER_SITE_OTHER): Payer: Medicare HMO

## 2016-10-07 DIAGNOSIS — Z4502 Encounter for adjustment and management of automatic implantable cardiac defibrillator: Secondary | ICD-10-CM | POA: Diagnosis not present

## 2016-10-07 DIAGNOSIS — I5022 Chronic systolic (congestive) heart failure: Secondary | ICD-10-CM | POA: Diagnosis not present

## 2016-10-07 NOTE — Progress Notes (Signed)
EPIC Encounter for ICM Monitoring  Patient Name: Kevin [name] is a 70 y.o. male Date: 10/07/2016 Primary Care Physican: [name], MD Primary Cardiologist:[name] Electrophysiologist: [name] Dry Weight:    184 lb  Bi-V Pacing:  99.8%        Heart Failure questions reviewed, pt asymptomatic   Thoracic impedance abnormal suggesting fluid accumulation.  He reported being out of Furosemide x 3 days due to he did not have any refills.  He also had a lot of salty foods during the holiday  Recommendations:  Advised to increase Furosemide 40 mg to 1 tablet bid x 3 days and return to prescribed dosage of 1 tablet daily.  Reinforced low salt food choices and limiting fluid intake to < 2 liters per day.  Follow-up plan: ICM clinic phone appointment on 10/18/2016 to recheck fluid levels.  Copy of ICM check sent to primary cardiologist and device physician.   ICM trend: 10/07/2016       Kevin Billings, RN 10/07/2016 5:55 PM

## 2016-10-07 NOTE — Progress Notes (Signed)
He really needs someone to keep an eye on him, doesn't he? Thank you, agree with plan. MCr

## 2016-10-13 ENCOUNTER — Ambulatory Visit: Payer: Medicare HMO | Admitting: Family Medicine

## 2016-10-14 ENCOUNTER — Ambulatory Visit (INDEPENDENT_AMBULATORY_CARE_PROVIDER_SITE_OTHER): Payer: Medicare HMO

## 2016-10-14 ENCOUNTER — Telehealth: Payer: Self-pay

## 2016-10-14 DIAGNOSIS — I5022 Chronic systolic (congestive) heart failure: Secondary | ICD-10-CM

## 2016-10-14 DIAGNOSIS — Z4502 Encounter for adjustment and management of automatic implantable cardiac defibrillator: Secondary | ICD-10-CM

## 2016-10-14 MED ORDER — SPIRONOLACTONE 25 MG PO TABS: 25.0000 mg | ORAL_TABLET | Freq: Every day | ORAL | 3 refills | Status: DC

## 2016-10-14 NOTE — Progress Notes (Signed)
EPIC Encounter for ICM Monitoring  Patient Name: Kevin [name] is a 70 y.o. male Date: 10/14/2016 Primary Care Physican: [name], MD Primary Cardiologist:[name] Electrophysiologist: [name] Dry Weight:unknown  Bi-V Pacing: 99.4%         Spoke with wife.  She stated they sent remote transmission because patient was feeling like heart was beating fast.  Reviewed transmission to recheck fluid levels and if any alerts noted. Wife also needed refill for Spironolactone and was submitted to pharmacy.   Thoracic impedance returned to normal after increasing Furosemide x 3 days on 11/30.   No alerts or observations noted.   Labs: 09/10/2016 Creatinine 1.07, BUN 25, Potassium 4.4, Sodium 135 09/02/2016 Creatinine 1.15, BUN 23, Potassium 4.3, Sodium 136  06/08/2016 Creatinine 1.14, BUN 18, Potassium 4.1, Sodium 134 04/16/2016 Creatinine 0.96, BUN 12, Potassium 4.4, Sodium 137 08/22/2015 Creatinine 0.98, BUN 16, Potassium 4.4, Sodium 138  Recommendations: No changes.  Reinforced to limit low salt food choices to 2000 mg day and limiting fluid intake to < 2 liters per day. Encouraged to call for fluid symptoms.    Follow-up plan: ICM clinic phone appointment on 11/15/2016.  Copy of ICM check sent to primary cardiologist and device physician to view updated fluid level check.    ICM trend: 10/12/2016       Rosalene Billings, RN 10/14/2016 2:00 PM

## 2016-10-14 NOTE — Progress Notes (Signed)
Good news, thanks.

## 2016-10-14 NOTE — Telephone Encounter (Signed)
Wife returned call.  She wanted to confirm dosages of Spironolactone and Furosemide.  Refill is needed for Spironolactone and refill sent to pharmacy.  She inquired if either of these meds could cause weight loss because he has lost about 20 lbs in last 6 months.  Advised that is not usually a common side effect. She stated patient sent remote transmission on 10/12/2016 because he was not feeling well.   See ICM note.

## 2016-10-14 NOTE — Telephone Encounter (Signed)
Wife left voice mail requesting a return call.  Attempted to call back.

## 2016-10-15 ENCOUNTER — Ambulatory Visit (INDEPENDENT_AMBULATORY_CARE_PROVIDER_SITE_OTHER): Payer: Medicare HMO | Admitting: Family Medicine

## 2016-10-15 ENCOUNTER — Encounter: Payer: Self-pay | Admitting: Family Medicine

## 2016-10-15 VITALS — BP 118/80 | HR 90 | Ht 75.0 in | Wt 182.7 lb

## 2016-10-15 DIAGNOSIS — R634 Abnormal weight loss: Secondary | ICD-10-CM

## 2016-10-15 NOTE — Patient Instructions (Signed)
Consider Glucerna in place of Ensure.   Let's monitor weight for now- since weight is back up 2 pounds.

## 2016-10-15 NOTE — Progress Notes (Signed)
Pre visit review using our clinic review tool, if applicable. No additional management support is needed unless otherwise documented below in the visit note. 

## 2016-10-15 NOTE — Progress Notes (Signed)
Subjective:     Patient ID: Kevin [name], male   DOB: 01-Sep-1946, 70 y.o.   MRN: [id number]  HPI Patient seen for follow-up regarding weight loss. Refer to previous note. He also about 20 pounds this year unintentional. He has diabetes which is been fairly well controlled with most recent A1c 6.5%. We obtain chest x-ray which showed no acute abnormalities. Question of some right chest wall lesions with clinical correlation recommended. Patient has multiple thick seborrheic keratoses which could be accounting for some of this. We obtained labs including sedimentation rate which is 17. CBC and comprehensive metabolic panel unremarkable. He states his appetite is fair. He started taking some supplement with in sure. Weight is up 2 pounds.  Denies any cough, headache, abdominal pain, change in stool habits. 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  [facility] Readings from Last 3 Encounters:  10/15/16 182 lb 11.2 oz (82.9 kg)  09/21/16 184 lb 4 oz (83.6 kg)  09/09/16 182 lb 12.8 oz (82.9 kg)     Past Medical History:  Diagnosis Date  . Agent orange exposure    in [place]  . Anemia   . Heart block AV second degree    a. s/p PPM implant with subsequent CRTD upgrade  . High cholesterol   . History of colon polyps   . Hypertension   . LBBB (left bundle branch block)   . Nonischemic cardiomyopathy ([place])    a. MDT CRTD upgrade 2016  . Prostate cancer ([place])    "not been treated yet; on a wait and see" (01/22/2015)  . Type II diabetes mellitus ([place])    Past Surgical History:  Procedure Laterality Date  . BASAL CELL CARCINOMA EXCISION     "back, face, neck"  . BI-VENTRICULAR IMPLANTABLE CARDIOVERTER DEFIBRILLATOR UPGRADE N/A 01/22/2015   MDT CRTD upgrade by Dr Rayann Heman  . CARDIAC CATHETERIZATION  01/27/2012   normal coronaries, dilated LV c/w nonischemic cardiomyopathy  . EXAMINATION UNDER ANESTHESIA  04/06/2012   Procedure:  EXAM UNDER ANESTHESIA;  Surgeon: Marcello Moores A. Cornett, MD;  Location: [facility];  Service: General;  Laterality: N/A;  . INCISION AND DRAINAGE PERIRECTAL ABSCESS  ~ 2010; 2015  . INGUINAL HERNIA REPAIR Left 1980  . LEFT HEART CATHETERIZATION WITH CORONARY ANGIOGRAM N/A 01/27/2012   Procedure: LEFT HEART CATHETERIZATION WITH CORONARY ANGIOGRAM;  Surgeon: Troy Sine, MD;  Location: [facility] CATH LAB;  Service: Cardiovascular;  Laterality: N/A;  . NM MYOCAR PERF WALL MOTION  01/21/2012   mod-severe perfusion defect due to infarct/scar w/mild perinfarct ischemia in the apical,basal inferoseptal,basal inferior,mid inferoseptal,midinferior and apical inferior regions  . PERMANENT PACEMAKER INSERTION N/A 12/01/2011   Medtronic implanted by Dr Sallyanne Kuster  . PROSTATE BIOPSY    . PROSTATE BIOPSY    . shrapnel surgery     "in [place]"  . TONSILLECTOMY      reports that he quit smoking about 32 years ago. His smoking use included Cigarettes. He has a 20.00 pack-year smoking history. He has never used smokeless tobacco. He reports that he drinks alcohol. He reports that he does not use drugs. family history includes Heart disease in his father; Leukemia in his father; Ovarian cancer in his mother. Allergies  Allergen Reactions  . Sulfa Antibiotics Swelling    Swelling of lips only.     Review of Systems  Constitutional: Negative for appetite change and fever.  Respiratory: Negative for cough and shortness of  breath.   Cardiovascular: Negative for chest pain.  Gastrointestinal: Negative for abdominal pain, blood in stool, diarrhea and vomiting.  Genitourinary: Negative for hematuria.  Neurological: Negative for headaches.  Hematological: Negative for adenopathy.  Psychiatric/Behavioral: Negative for dysphoric mood.       Objective:   Physical Exam  Constitutional: He appears well-developed and well-nourished.  Neck: Neck supple.  Cardiovascular: Normal rate and regular rhythm.   Pulmonary/Chest: Effort  normal and breath sounds normal. No respiratory distress. He has no wheezes. He has no rales.  Chest wall and breast are examined. He has no masses. No nipple inversion. He has multiple seborrheic keratoses on the skin. No lipomas or chest wall masses. No axillary adenopathy  Abdominal: Soft. Bowel sounds are normal. He exhibits no distension and no mass. There is no tenderness. There is no rebound and no guarding.  Musculoskeletal: He exhibits no edema.  Lymphadenopathy:    He has no cervical adenopathy.       Assessment:     Recent unintentional weight loss. Seems to stabilize. His weight is up almost 2 pounds today    Plan:     -We have recommended if he has to take a supplement Glucerna and place it in sure because his diabetes -Discussed healthy dietary supplements -Continue monitoring weight at this time with routine follow-up in 3 months  [name] MD [name]

## 2016-10-18 ENCOUNTER — Ambulatory Visit (INDEPENDENT_AMBULATORY_CARE_PROVIDER_SITE_OTHER): Payer: Medicare HMO | Admitting: *Deleted

## 2016-10-18 DIAGNOSIS — I428 Other cardiomyopathies: Secondary | ICD-10-CM

## 2016-10-18 NOTE — Progress Notes (Signed)
Remote ICD transmission.   

## 2016-10-19 ENCOUNTER — Other Ambulatory Visit: Payer: Self-pay | Admitting: Endocrinology

## 2016-10-20 ENCOUNTER — Ambulatory Visit (INDEPENDENT_AMBULATORY_CARE_PROVIDER_SITE_OTHER): Payer: Medicare HMO | Admitting: Family Medicine

## 2016-10-20 ENCOUNTER — Encounter: Payer: Self-pay | Admitting: Family Medicine

## 2016-10-20 DIAGNOSIS — Z86010 Personal history of colonic polyps: Secondary | ICD-10-CM | POA: Insufficient documentation

## 2016-10-20 NOTE — Patient Instructions (Signed)
We will set up GI referral for repeat colonoscopy.  

## 2016-10-20 NOTE — Progress Notes (Signed)
Pre visit review using our clinic review tool, if applicable. No additional management support is needed unless otherwise documented below in the visit note. 

## 2016-10-20 NOTE — Progress Notes (Signed)
Subjective:     Patient ID: Kevin [name], male   DOB: Jun 07, 1946, 70 y.o.   MRN: [id number]  HPI Patient seen for follow-up of recent weight loss. He for some reason was scheduled today though we did not recall having him reschedule this soon.   In any event, he has gained 4 pounds over the past several weeks and states his appetite is good. He attributes some of the weight loss to increased walking from volunteer activities. Recent lab work was reassuring. Chest x-ray no acute abnormalities.  Patient does have history of adenomatous colon polyps on colonoscopy October 2014 and recommended three-year follow-up. He's not set up anything thus far.  Past Medical History:  Diagnosis Date  . Agent orange exposure    in [place]  . Anemia   . Heart block AV second degree    a. s/p PPM implant with subsequent CRTD upgrade  . High cholesterol   . History of colon polyps   . Hypertension   . LBBB (left bundle branch block)   . Nonischemic cardiomyopathy ([place])    a. MDT CRTD upgrade 2016  . Prostate cancer ([place])    "not been treated yet; on a wait and see" (01/22/2015)  . Type II diabetes mellitus ([place])    Past Surgical History:  Procedure Laterality Date  . BASAL CELL CARCINOMA EXCISION     "back, face, neck"  . BI-VENTRICULAR IMPLANTABLE CARDIOVERTER DEFIBRILLATOR UPGRADE N/A 01/22/2015   MDT CRTD upgrade by Dr Rayann Heman  . CARDIAC CATHETERIZATION  01/27/2012   normal coronaries, dilated LV c/w nonischemic cardiomyopathy  . EXAMINATION UNDER ANESTHESIA  04/06/2012   Procedure: EXAM UNDER ANESTHESIA;  Surgeon: Marcello Moores A. Cornett, MD;  Location: [place];  Service: General;  Laterality: N/A;  . INCISION AND DRAINAGE PERIRECTAL ABSCESS  ~ 2010; 2015  . INGUINAL HERNIA REPAIR Left 1980  . LEFT HEART CATHETERIZATION WITH CORONARY ANGIOGRAM N/A 01/27/2012   Procedure: LEFT HEART CATHETERIZATION WITH CORONARY ANGIOGRAM;  Surgeon: Troy Sine, MD;  Location[facility] CATH LAB;  Service: Cardiovascular;   Laterality: N/A;  . NM MYOCAR PERF WALL MOTION  01/21/2012   mod-severe perfusion defect due to infarct/scar w/mild perinfarct ischemia in the apical,basal inferoseptal,basal inferior,mid inferoseptal,midinferior and apical inferior regions  . PERMANENT PACEMAKER INSERTION N/A 12/01/2011   Medtronic implanted by Dr Sallyanne Kuster  . PROSTATE BIOPSY    . PROSTATE BIOPSY    . shrapnel surgery     "in [place]"  . TONSILLECTOMY      reports that he quit smoking about 32 years ago. His smoking use included Cigarettes. He has a 20.00 pack-year smoking history. He has never used smokeless tobacco. He reports that he drinks alcohol. He reports that he does not use drugs. family history includes Heart disease in his father; Leukemia in his father; Ovarian cancer in his mother. Allergies  Allergen Reactions  . Sulfa Antibiotics Swelling    Swelling of lips only.     Review of Systems  Constitutional: Negative for appetite change, chills and fever.  Respiratory: Negative for shortness of breath.   Cardiovascular: Negative for chest pain.  Gastrointestinal: Negative for abdominal pain.  Genitourinary: Negative for dysuria.       Objective:   Physical Exam  Constitutional: He appears well-developed and well-nourished.  Cardiovascular: Normal rate and regular rhythm.   Pulmonary/Chest: Effort normal and breath sounds normal. No respiratory distress. He has no wheezes. He has no rales.       Assessment:     #  1 recent weight loss which seems to have reversed. No further evaluation indicated  #2 history of adenomatous colon polyps    Plan:     -Set up GI referral for repeat colonoscopy  [name] MD [facility] at [facility]

## 2016-10-22 ENCOUNTER — Telehealth: Payer: Self-pay | Admitting: Cardiology

## 2016-10-22 ENCOUNTER — Encounter: Payer: Self-pay | Admitting: Cardiology

## 2016-10-22 NOTE — Telephone Encounter (Signed)
Transmission received. Normal device function. [name] says that he is dizzy at times. When asked, [name] says that he feels dizzy with position change. He has not taken his BP during his episodes of dizziness. I recommended that he take his BP when he is symptomatic. He is electing to get off the phone and take his BP and call back if he has any problems. I explained that this would not be corrected by his device but would need other intervention- he verbalizes understanding.

## 2016-10-22 NOTE — Telephone Encounter (Signed)
Pt called and stated that at times he can feel palpitations and dizziness. He doesn't know how to describe what he is feeling. Instructed pt to send a remote transmission and a device tech nurse will call back with results. Pt verbalized understanding.

## 2016-10-27 LAB — CUP PACEART REMOTE DEVICE CHECK
Battery Remaining Longevity: 37 mo
Brady Statistic AP VS Percent: 0.01 %
Brady Statistic RA Percent Paced: 1.47 %
Date Time Interrogation Session: 20171211083624
HighPow Impedance: 81 Ohm
Implantable Lead Implant Date: 20160316
Implantable Lead Implant Date: 20160316
Implantable Pulse Generator Implant Date: 20160316
Lead Channel Impedance Value: 1007 Ohm
Lead Channel Impedance Value: 1045 Ohm
Lead Channel Impedance Value: 1064 Ohm
Lead Channel Impedance Value: 418 Ohm
Lead Channel Impedance Value: 437 Ohm
Lead Channel Impedance Value: 494 Ohm
Lead Channel Impedance Value: 570 Ohm
Lead Channel Impedance Value: 855 Ohm
Lead Channel Pacing Threshold Pulse Width: 0.4 ms
Lead Channel Sensing Intrinsic Amplitude: 12 mV
Lead Channel Sensing Intrinsic Amplitude: 5 mV
Lead Channel Setting Pacing Amplitude: 2 V
Lead Channel Setting Pacing Amplitude: 2.5 V
Lead Channel Setting Pacing Pulse Width: 0.4 ms
Lead Channel Setting Pacing Pulse Width: 1 ms
Lead Channel Setting Sensing Sensitivity: 0.3 mV
MDC IDC LEAD IMPLANT DT: 20130123
MDC IDC LEAD LOCATION: 753858
MDC IDC LEAD LOCATION: 753859
MDC IDC LEAD LOCATION: 753860
MDC IDC LEAD MODEL: 4598
MDC IDC MSMT BATTERY VOLTAGE: 2.95 V
MDC IDC MSMT LEADCHNL LV IMPEDANCE VALUE: 551 Ohm
MDC IDC MSMT LEADCHNL LV IMPEDANCE VALUE: 551 Ohm
MDC IDC MSMT LEADCHNL LV IMPEDANCE VALUE: 646 Ohm
MDC IDC MSMT LEADCHNL LV IMPEDANCE VALUE: 855 Ohm
MDC IDC MSMT LEADCHNL LV PACING THRESHOLD AMPLITUDE: 3.25 V
MDC IDC MSMT LEADCHNL LV PACING THRESHOLD PULSEWIDTH: 1 ms
MDC IDC MSMT LEADCHNL RA IMPEDANCE VALUE: 399 Ohm
MDC IDC MSMT LEADCHNL RA PACING THRESHOLD AMPLITUDE: 0.5 V
MDC IDC MSMT LEADCHNL RA SENSING INTR AMPL: 5 mV
MDC IDC MSMT LEADCHNL RV PACING THRESHOLD AMPLITUDE: 0.5 V
MDC IDC MSMT LEADCHNL RV PACING THRESHOLD PULSEWIDTH: 0.4 ms
MDC IDC MSMT LEADCHNL RV SENSING INTR AMPL: 12 mV
MDC IDC SET LEADCHNL LV PACING AMPLITUDE: 3.75 V
MDC IDC STAT BRADY AP VP PERCENT: 1.47 %
MDC IDC STAT BRADY AS VP PERCENT: 98.3 %
MDC IDC STAT BRADY AS VS PERCENT: 0.23 %
MDC IDC STAT BRADY RV PERCENT PACED: 99.59 %

## 2016-10-30 ENCOUNTER — Emergency Department (HOSPITAL_COMMUNITY)
Admission: EM | Admit: 2016-10-30 | Discharge: 2016-10-31 | Disposition: A | Discharge status: Home / Self Care | Payer: Medicare HMO | Attending: Emergency Medicine | Admitting: Emergency Medicine

## 2016-10-30 ENCOUNTER — Emergency Department (HOSPITAL_COMMUNITY): Payer: Medicare HMO

## 2016-10-30 ENCOUNTER — Encounter (HOSPITAL_COMMUNITY): Payer: Self-pay | Admitting: Emergency Medicine

## 2016-10-30 DIAGNOSIS — Z87891 Personal history of nicotine dependence: Secondary | ICD-10-CM | POA: Insufficient documentation

## 2016-10-30 DIAGNOSIS — Z7982 Long term (current) use of aspirin: Secondary | ICD-10-CM | POA: Diagnosis not present

## 2016-10-30 DIAGNOSIS — Z7984 Long term (current) use of oral hypoglycemic drugs: Secondary | ICD-10-CM | POA: Insufficient documentation

## 2016-10-30 DIAGNOSIS — Z8546 Personal history of malignant neoplasm of prostate: Secondary | ICD-10-CM | POA: Insufficient documentation

## 2016-10-30 DIAGNOSIS — M25561 Pain in right knee: Secondary | ICD-10-CM | POA: Insufficient documentation

## 2016-10-30 DIAGNOSIS — Z950 Presence of cardiac pacemaker: Secondary | ICD-10-CM | POA: Insufficient documentation

## 2016-10-30 DIAGNOSIS — E119 Type 2 diabetes mellitus without complications: Secondary | ICD-10-CM | POA: Insufficient documentation

## 2016-10-30 DIAGNOSIS — S8991XA Unspecified injury of right lower leg, initial encounter: Secondary | ICD-10-CM | POA: Diagnosis not present

## 2016-10-30 DIAGNOSIS — I10 Essential (primary) hypertension: Secondary | ICD-10-CM | POA: Insufficient documentation

## 2016-10-30 NOTE — ED Provider Notes (Signed)
[facility] DEPT Provider Note   CSN: [id number] Arrival date & time: 10/30/16  1313     History   Chief Complaint Chief Complaint  Patient presents with  . Knee Pain    HPI MORRISON WINTHROP is a 70 y.o. male.  The history is provided by the patient and medical records. No language interpreter was used.  Knee Pain     DEONDRE MEN is a 70 y.o. male  with an extensive cardiac history and hx of DM who presents to the Emergency Department complaining of anterior and lateral right knee pain which began acutely last night when he twisted his knee oddly going down the stairs. He states that he missed a stair today but did not fall down. He took a Tylenol PM last night. His wife had a knee brace at home and he has been wearing that. Denies swelling, catching, locking. No ankle pain, calf pain or hip pain. No numbness, tingling, weakness. Wife at bedside states that he is typically able to ambulate on his own without any difficulty, however he has not been able to ambulate on the extremity. Patient himself notes that he can ambulate, however it is very painful.  Past Medical History:  Diagnosis Date  . Agent orange exposure    in [place]  . Anemia   . Heart block AV second degree    a. s/p PPM implant with subsequent CRTD upgrade  . High cholesterol   . History of colon polyps   . Hypertension   . LBBB (left bundle branch block)   . Nonischemic cardiomyopathy ([place])    a. MDT CRTD upgrade 2016  . Prostate cancer ([place])    "not been treated yet; on a wait and see" (01/22/2015)  . Type II diabetes mellitus [facility])     Patient Active Problem List   Diagnosis Date Noted  . Hx of adenomatous colonic polyps 10/20/2016  . Malignant neoplasm of prostate ([place]) 01/19/2016  . Chronic systolic dysfunction of left ventricle 01/22/2015  . Left bundle branch block 11/24/2014  . Sick sinus syndrome ([place]) 11/24/2014  . Type 2 diabetes mellitus, uncontrolled ([place]) 09/13/2014  .  Nonischemic cardiomyopathy ([place]) 11/23/2013  . Ventricular tachycardia ([place]) 11/23/2013  . Pacemaker 08/27/2013  . DM2 (diabetes mellitus, type 2) ([place]) 08/27/2013  . Hyperlipidemia 08/27/2013  . Hypertension 10/16/2012  . Rectal discharge 07/14/2012  . Jaw pain 07/14/2012  . 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 Type 2 diabetes mellitus ([place]) 04/16/2011  . Insomnia 04/16/2011  . Anxiety 04/16/2011    Past Surgical History:  Procedure Laterality Date  . BASAL CELL CARCINOMA EXCISION     "back, face, neck"  . BI-VENTRICULAR IMPLANTABLE CARDIOVERTER DEFIBRILLATOR UPGRADE N/A 01/22/2015   MDT CRTD upgrade by Dr Rayann Heman  . CARDIAC CATHETERIZATION  01/27/2012   normal coronaries, dilated LV c/w nonischemic cardiomyopathy  . EXAMINATION UNDER ANESTHESIA  04/06/2012   Procedure: EXAM UNDER ANESTHESIA;  Surgeon: Marcello Moores A. Cornett, MD;  Location: [facility];  Service: General;  Laterality: N/A;  . INCISION AND DRAINAGE PERIRECTAL ABSCESS  ~ 2010; 2015  . INGUINAL HERNIA REPAIR Left 1980  . LEFT HEART CATHETERIZATION WITH CORONARY ANGIOGRAM N/A 01/27/2012   Procedure: LEFT HEART CATHETERIZATION WITH CORONARY ANGIOGRAM;  Surgeon: Troy Sine, MD;  Location: [facility] CATH LAB;  Service: Cardiovascular;  Laterality: N/A;  . NM MYOCAR PERF WALL MOTION  01/21/2012   mod-severe perfusion defect due to infarct/scar w/mild perinfarct ischemia in the apical,basal inferoseptal,basal inferior,mid inferoseptal,midinferior and apical  inferior regions  . PERMANENT PACEMAKER INSERTION N/A 12/01/2011   Medtronic implanted by Dr Sallyanne Kuster  . PROSTATE BIOPSY    . PROSTATE BIOPSY    . shrapnel surgery     "in [place]"  . TONSILLECTOMY         Home Medications    Prior to Admission medications   Medication Sig Start Date End Date Taking? Authorizing Provider  ACCU-CHEK COMPACT PLUS test strip use 1 TEST STRIP once daily as directed 07/05/16   Elayne Snare, MD  aspirin 81 MG tablet Take 81 mg by mouth daily.      Historical  Provider, MD  atorvastatin (LIPITOR) 80 MG tablet Take 40 mg by mouth daily at 6 PM.    Historical Provider, MD  buPROPion (WELLBUTRIN XL) 150 MG 24 hr tablet TAKE 1 TABLET BY MOUTH EVERY DAY 10/04/16   [name], MD  carvedilol (COREG) 12.5 MG tablet TAKE ONE TABLET BY MOUTH TWICE DAILY WITH A MEAL 08/06/15   Mihai [name], MD  clobetasol ointment (TEMOVATE) [id number] % Apply 1 application topically daily as needed (itching).  09/26/13   Historical Provider, MD  Coenzyme Q10 (COQ-10) 200 MG CAPS Take by mouth.    Historical Provider, MD  furosemide (LASIX) 40 MG tablet TAKE 1 TABLET BY MOUTH DAILY WITH BREAKFAST. 10/04/16   Mihai [name], MD  gabapentin (NEURONTIN) 100 MG capsule Take 1 capsule (100 mg total) by mouth 3 (three) times daily. 06/09/16   Elayne Snare, MD  glipiZIDE (GLUCOTROL XL) 5 MG 24 hr tablet TAKE 1 TABLET BY MOUTH EVERY DAY WITH BREAKFAST 10/10/15   Elayne Snare, MD  JANUMET XR 50-1000 MG TB24 TAKE 2 TABLETS BY MOUTH EVERY DAY 10/19/16   Elayne Snare, MD  latanoprost (XALATAN) 0.005 % ophthalmic solution Place 1 drop into the right eye daily.  09/21/12   Historical Provider, MD  losartan (COZAAR) 100 MG tablet Take 50 mg by mouth daily.    Historical Provider, MD  spironolactone (ALDACTONE) 25 MG tablet Take 1 tablet (25 mg total) by mouth daily. 10/14/16 01/12/17  [name], MD  VIAGRA 100 MG tablet TAKE ONE TABLET BY MOUTH AS NEEDED FOR ERECTILE DYSFUNCTION. 02/21/15   [name], MD    Family History Family History  Problem Relation Age of Onset  . Ovarian cancer Mother   . Leukemia Father   . Heart disease Father   . Diabetes Neg Hx     Social History Social History  Substance Use Topics  . Smoking status: Former Smoker    Packs/day: 1.00    Years: 20.00    Types: Cigarettes    Quit date: 04/13/1984  . Smokeless tobacco: Never Used  . Alcohol use Yes     Comment: 01/22/2015 "maybe 3 beers or glasses of wine /month"     Allergies   Sulfa  antibiotics   Review of Systems Review of Systems  Constitutional: Negative for fever.  HENT: Negative for congestion.   Eyes: Negative for visual disturbance.  Respiratory: Negative for cough and shortness of breath.   Cardiovascular: Negative.   Gastrointestinal: Negative for abdominal pain, nausea and vomiting.  Genitourinary: Negative for dysuria.  Musculoskeletal: Positive for arthralgias. Negative for joint swelling.  Skin: Negative for rash.  Neurological: Negative for headaches.     Physical Exam Updated Vital Signs BP 115/65   Pulse 73   Temp 97.4 F (36.3 C) (Oral)   Resp 16   Ht 6\' 1"  (1.854 m)   Wt  84.4 kg   SpO2 97%   BMI 24.54 kg/m   Physical Exam  Constitutional: He is oriented to person, place, and time. He appears well-developed and well-nourished. No distress.  HENT:  Head: Normocephalic and atraumatic.  Cardiovascular: Normal rate, regular rhythm and normal heart sounds.   No murmur heard. Pulmonary/Chest: Effort normal and breath sounds normal. No respiratory distress.  Abdominal: Soft. He exhibits no distension. There is no tenderness.  Musculoskeletal:  Right knee: No gross deformity noted. Knee with full ROM. TTP of lateral knee. No joint line or bony TTP.There is no joint effusion or swelling appreciated. No abnormal alignment or patellar mobility. No bruising, erythema, or warmth overlaying the joint. No varus/valgus laxity. Negative drawer's, negative Lachman's, negative McMurray's, no crepitus. 2+ DP pulses bilaterally. All compartments are soft. Sensation intact distal to injury.  Neurological: He is alert and oriented to person, place, and time.  Skin: Skin is warm and dry.  Nursing note and vitals reviewed.    ED Treatments / Results  Labs (all labs ordered are listed, but only abnormal results are displayed) Labs Reviewed - No data to display  EKG  EKG Interpretation None       Radiology Dg Knee Complete 4 Views  Right  Result Date: 10/30/2016 CLINICAL DATA:  Right knee twisting injury going down stairs. Lateral pain. EXAM: RIGHT KNEE - COMPLETE 4+ VIEW COMPARISON:  None available FINDINGS: Minor tricompartmental degenerative osteoarthritis with joint space loss, bony spurring and minor sclerosis. No acute osseous finding, fracture or effusion. No definite soft tissue abnormality. IMPRESSION: Minor right knee tricompartmental osteoarthritis. No acute osseous finding. Electronically Signed   By: Jerilynn Mages.  Shick M.D.   On: 10/30/2016 14:05    Procedures Procedures (including critical care time)  Medications Ordered in ED Medications - No data to display   Initial Impression / Assessment and Plan / ED Course  I have reviewed the triage vital signs and the nursing notes.  Pertinent labs & imaging results that were available during my care of the patient were reviewed by me and considered in my medical decision making (see chart for details).  Clinical Course    NAETHAN VIVO is a 70 y.o. male who presents to ED for right knee pain after twisting knee oddly yesterday. On exam, RLE is NVI. Ligaments intact. No swelling. No calf tenderness. X-ray with no acute abnormalities. Patient already has knee brace on which is providing adequate stabilization and compression. Typically ambulates on his own with no difficulty. Crutches provided and patient able to use them in ED with ease. Ortho follow up if no improvement. All questions answered.   Patient discussed with Dr. Zenia Resides who agrees with treatment plan.    Final Clinical Impressions(s) / ED Diagnoses   Final diagnoses:  Acute pain of right knee    New Prescriptions New Prescriptions   No medications on file     [facility], PA-C 10/30/16 1513    Lacretia Leigh, MD 10/30/16 1527

## 2016-10-30 NOTE — Discharge Instructions (Signed)
Continue to wear knee brace. Ice for pain relief. Tylenol or ibuprofen for additional pain relief. Crutches as needed for comfort. If symptoms do not improve in one week, you will need to call the orthopedist listed to schedule a follow up appointment. Return to the ER for new or worsening symptoms, any additional concerns.

## 2016-10-30 NOTE — ED Triage Notes (Signed)
Pt complaint of right knee pain related to twisting knee yesterday. Pt states hurts to bear weight.

## 2016-11-02 ENCOUNTER — Telehealth: Payer: Self-pay

## 2016-11-02 NOTE — Telephone Encounter (Signed)
Spoke with pt and he states that his knee is doing some better. He is ambulating without crutches, but is still wearing a knee brace. He does report some continued pain.   Pt is also concerned about recent weight loss. Pt reports approximate loss >=20lbs in the past several weeks. Pt does have prostate cancer and reports recent PSA of 7.6 drawn about 3 weeks ago at the [place]. Pt is concerned about a possible correlation.   Appt with Dr Elease Hashimoto scheduled for 11/09/16. Nothing further needed at this time.   Dr Elease Hashimoto - [place]

## 2016-11-05 ENCOUNTER — Encounter: Payer: Self-pay | Admitting: Cardiology

## 2016-11-09 ENCOUNTER — Ambulatory Visit (INDEPENDENT_AMBULATORY_CARE_PROVIDER_SITE_OTHER): Payer: Medicare HMO | Admitting: Family Medicine

## 2016-11-09 ENCOUNTER — Encounter: Payer: Self-pay | Admitting: Family Medicine

## 2016-11-09 ENCOUNTER — Other Ambulatory Visit: Payer: Self-pay | Admitting: Cardiovascular Disease

## 2016-11-09 ENCOUNTER — Other Ambulatory Visit: Payer: Self-pay | Admitting: Endocrinology

## 2016-11-09 VITALS — BP 102/64 | HR 77 | Temp 98.0°F | Ht 73.0 in | Wt 185.1 lb

## 2016-11-09 DIAGNOSIS — M25561 Pain in right knee: Secondary | ICD-10-CM

## 2016-11-09 NOTE — Progress Notes (Signed)
Subjective:     Patient ID: Kevin [name], male   DOB: 1946-01-23, 71 y.o.   MRN: [id number]  HPI Patient seen for ER follow-up. On December 22 he was going downstairs in his town home and apparently Kevin [name] and twisted his right knee. He did not fall onto his knee. He noticed some poorly localized pain but no swelling. No weakness. He was seen in the emergency department on December 23. X-rays revealed no acute abnormalities. Patient was already using knee brace that he had which seem to be helping. He was given crutches but has not required these. He states his knee pain is significantly improved at this time. No instability. Ambulating without pain.  Past Medical History:  Diagnosis Date  . Agent orange exposure    in [place]  . Anemia   . Heart block AV second degree    a. s/p PPM implant with subsequent CRTD upgrade  . High cholesterol   . History of colon polyps   . Hypertension   . LBBB (left bundle branch block)   . Nonischemic cardiomyopathy ([place])    a. MDT CRTD upgrade 2016  . Prostate cancer ([place])    "not been treated yet; on a wait and see" (01/22/2015)  . Type II diabetes mellitus ([place])    Past Surgical History:  Procedure Laterality Date  . BASAL CELL CARCINOMA EXCISION     "back, face, neck"  . BI-VENTRICULAR IMPLANTABLE CARDIOVERTER DEFIBRILLATOR UPGRADE N/A 01/22/2015   MDT CRTD upgrade by Dr Rayann Heman  . CARDIAC CATHETERIZATION  01/27/2012   normal coronaries, dilated LV c/w nonischemic cardiomyopathy  . EXAMINATION UNDER ANESTHESIA  04/06/2012   Procedure: EXAM UNDER ANESTHESIA;  Surgeon: Marcello Moores A. Cornett, MD;  Location: [place];  Service: General;  Laterality: N/A;  . INCISION AND DRAINAGE PERIRECTAL ABSCESS  ~ 2010; 2015  . INGUINAL HERNIA REPAIR Left 1980  . LEFT HEART CATHETERIZATION WITH CORONARY ANGIOGRAM N/A 01/27/2012   Procedure: LEFT HEART CATHETERIZATION WITH CORONARY ANGIOGRAM;  Surgeon: Troy Sine, MD;  Location: [facility] CATH LAB;  Service:  Cardiovascular;  Laterality: N/A;  . NM MYOCAR PERF WALL MOTION  01/21/2012   mod-severe perfusion defect due to infarct/scar w/mild perinfarct ischemia in the apical,basal inferoseptal,basal inferior,mid inferoseptal,midinferior and apical inferior regions  . PERMANENT PACEMAKER INSERTION N/A 12/01/2011   Medtronic implanted by Dr Sallyanne Kuster  . PROSTATE BIOPSY    . PROSTATE BIOPSY    . shrapnel surgery     "in [place]"  . TONSILLECTOMY      reports that he quit smoking about 32 years ago. His smoking use included Cigarettes. He has a 20.00 pack-year smoking history. He has never used smokeless tobacco. He reports that he drinks alcohol. He reports that he does not use drugs. family history includes Heart disease in his father; Leukemia in his father; Ovarian cancer in his mother. Allergies  Allergen Reactions  . Sulfa Antibiotics Swelling    Swelling of lips only.     Review of Systems  Neurological: Negative for weakness and numbness.       Objective:   Physical Exam  Constitutional: He appears well-developed and well-nourished.  Cardiovascular: Normal rate and regular rhythm.   Pulmonary/Chest: Effort normal and breath sounds normal. No respiratory distress. He has no wheezes. He has no rales.  Musculoskeletal:  Right knee reveals no ecchymosis. No edema. No erythema. No joint line tenderness. Neurovascular exam is intact. No ligament instability. No effusion       Assessment:  Right knee strain following injury as above. Improved    Plan:     -Continue knee brace if needed -Gradually resume regular activities such as golf as tolerated  Kevin Post MD [facility]

## 2016-11-09 NOTE — Patient Instructions (Signed)
Follow up for any recurrent knee pain, swelling, or weakness.

## 2016-11-09 NOTE — Progress Notes (Signed)
Pre visit review using our clinic review tool, if applicable. No additional management support is needed unless otherwise documented below in the visit note. 

## 2016-11-15 ENCOUNTER — Ambulatory Visit (INDEPENDENT_AMBULATORY_CARE_PROVIDER_SITE_OTHER): Payer: Medicare HMO

## 2016-11-15 DIAGNOSIS — I5022 Chronic systolic (congestive) heart failure: Secondary | ICD-10-CM | POA: Diagnosis not present

## 2016-11-15 DIAGNOSIS — Z4502 Encounter for adjustment and management of automatic implantable cardiac defibrillator: Secondary | ICD-10-CM

## 2016-11-15 NOTE — Progress Notes (Signed)
EPIC Encounter for ICM Monitoring  Patient Name: Kevin [name] is a 71 y.o. male Date: 11/15/2016 Primary Care Physican: [name], MD Primary Cardiologist:[name] Electrophysiologist: [name] Dry Weight:unknown  Bi-V Pacing:  99.5%       Heart Failure questions reviewed, pt asymptomatic   Thoracic impedance abnormal suggesting fluid accumulation since 11/04/2016 and almost at baseline today.  Labs: 09/10/2016 Creatinine 1.07, BUN 25, Potassium 4.4, Sodium 135 09/02/2016 Creatinine 1.15, BUN 23, Potassium 4.3, Sodium 136  06/08/2016 Creatinine 1.14, BUN 18, Potassium 4.1, Sodium 134 04/16/2016 Creatinine 0.96, BUN 12, Potassium 4.4, Sodium 137 08/22/2015 Creatinine 0.98, BUN 16, Potassium 4.4, Sodium 138   Recommendations: No changes.  Discussed amount of salt in foods and he does not read food labels.  Advised to read food labels and limit low salt food choices to 2000 mg day and limiting fluid intake to < 2 liters per day. Encouraged to call for fluid symptoms.    Follow-up plan: ICM clinic phone appointment on 12/16/2016.  Copy of ICM check sent to primary cardiologist and device physician.   3 month ICM trend : 11/15/2016   1 Year ICM trend:      Rosalene Billings, RN 11/15/2016 7:58 AM

## 2016-11-16 NOTE — Progress Notes (Signed)
Thanks MCr 

## 2016-11-22 ENCOUNTER — Encounter: Payer: Self-pay | Admitting: Family Medicine

## 2016-12-07 ENCOUNTER — Other Ambulatory Visit (INDEPENDENT_AMBULATORY_CARE_PROVIDER_SITE_OTHER): Payer: Medicare HMO

## 2016-12-07 DIAGNOSIS — E1165 Type 2 diabetes mellitus with hyperglycemia: Secondary | ICD-10-CM

## 2016-12-07 LAB — COMPREHENSIVE METABOLIC PANEL
ALBUMIN: 4.2 g/dL (ref 3.5–5.2)
ALT: 7 U/L (ref 0–53)
AST: 18 U/L (ref 0–37)
Alkaline Phosphatase: 60 U/L (ref 39–117)
BUN: 13 mg/dL (ref 6–23)
CALCIUM: 9.3 mg/dL (ref 8.4–10.5)
CHLORIDE: 101 meq/L (ref 96–112)
CO2: 30 mEq/L (ref 19–32)
Creatinine, Ser: 1.13 mg/dL (ref 0.40–1.50)
GFR: 67.98 mL/min (ref >60.00)
Glucose, Bld: 175 mg/dL — ABNORMAL HIGH (ref 70–99)
POTASSIUM: 4.6 meq/L (ref 3.5–5.1)
Sodium: 135 mEq/L (ref 135–145)
Total Bilirubin: 0.6 mg/dL (ref 0.2–1.2)
Total Protein: 7.2 g/dL (ref 6.0–8.3)

## 2016-12-07 LAB — LIPID PANEL
CHOLESTEROL: 104 mg/dL (ref 0–200)
HDL: 34.9 mg/dL — ABNORMAL LOW (ref >39.00)
LDL Cholesterol: 56 mg/dL (ref 0–99)
NonHDL: 68.9
TRIGLYCERIDES: 67 mg/dL (ref 0.0–149.0)
Total CHOL/HDL Ratio: 3
VLDL: 13.4 mg/dL (ref 0.0–40.0)

## 2016-12-07 LAB — MICROALBUMIN / CREATININE URINE RATIO
CREATININE, U: 51.9 mg/dL
MICROALB UR: 0.6 mg/dL (ref 0.0–1.9)
Microalb Creat Ratio: 1.2 mg/g (ref 0.0–30.0)

## 2016-12-07 LAB — HEMOGLOBIN A1C: Hgb A1c MFr Bld: 6.6 % — ABNORMAL HIGH (ref 4.6–6.5)

## 2016-12-10 ENCOUNTER — Encounter: Payer: Self-pay | Admitting: Endocrinology

## 2016-12-10 ENCOUNTER — Ambulatory Visit (INDEPENDENT_AMBULATORY_CARE_PROVIDER_SITE_OTHER): Payer: Medicare HMO | Admitting: Endocrinology

## 2016-12-10 VITALS — BP 120/64 | HR 74 | Ht 73.0 in | Wt 178.0 lb

## 2016-12-10 DIAGNOSIS — E1165 Type 2 diabetes mellitus with hyperglycemia: Secondary | ICD-10-CM | POA: Diagnosis not present

## 2016-12-10 MED ORDER — GLIPIZIDE ER 5 MG PO TB24: ORAL_TABLET | ORAL | 3 refills | Status: DC

## 2016-12-10 NOTE — Patient Instructions (Addendum)
Change to Glipizide ER  Check more sugars

## 2016-12-10 NOTE — Progress Notes (Signed)
Patient ID: Kevin [name], male   DOB: 1946/04/03, 71 y.o.   MRN: [id number]    Reason for Appointment:  Followup for Type 2 Diabetes   History of Present Illness:          Diagnosis: Type 2 diabetes mellitus, date of diagnosis: 2001        Past history: His previous records are not available but initially his symptoms included weight loss He thinks he has been on metformin for several years and more recently has been taking 1000 mg twice a day [facility] His A1c was 6.9 in 2013 but a year later was 8.1 He had been on glipizide 5 mg twice a day in addition to his metformin 1 g twice a day   Prior to his consultation when his A1c was 10% and he was given Januvia in addition  Recent history:   His A1c is 6.6  Current management, blood sugar patterns and problems identified:  Although his A1c appears better at 6.6 his blood sugars recently at home are much higher Again checking blood sugar very sporadically at home He says he has had a lot of stress Also has not been exercising or walking as before which he thinks was helping. Tending to have some inconsistent diet with adding honey to his drinks Not clear why  glucose was 334 after breakfast today He now says that he is getting his gl[facility]izide from the [facility] and they changed his dosage from the 5 mg extended release to 2.5 mg twice a day He has not had any side effects from Janumet XR  Hypoglycemia: None recently       Oral hypoglycemic drugs the patient is taking are: Glipizide  2.5 mg bid Janumet XR 100/2000 daily Side effects from medications have been: None  Glucose monitoring:  done 1-2 times a day         Glucometer:  Accu-Chek compact     Blood Glucose readings  by download: range 83-334 avg 176  Glycemic control:  A1c on 08/19/16 is 6.5  Lab Results  Component Value Date   HGBA1C 6.6 (H) 12/07/2016   HGBA1C 7.2 (H) 06/08/2016   HGBA1C 7.1  (H) 08/22/2015   Lab Results  Component Value Date   MICROALBUR 0.6 12/07/2016   LDLCALC 56 12/07/2016   CREATININE 1.13 12/07/2016     Self-care: The diet that the patient has been following is: tries to limit portions, may get fried food when eating out, some fast food He is drinking juice periodically especially cranberry juice for his "kidneys"  Meals: 3 meals per day.  breakfast is at 7-8 am and is usually eating a biscuit or special K. cereal Usually eating baked chicken or chicken salad sandwiches for meals and will have fruits for snacks          Exercise:  none      Dietician visit: Most recent:200              Weight history:  Wt Readings from Last 3 Encounters:  12/10/16 178 lb (80.7 kg)  11/09/16 185 lb 1.6 oz (84 kg)  10/30/16 186 lb (84.4 kg)    OTHER active problems: See Review of systems  Lab on 12/07/2016  Component Date Value Ref Range Status  . Hgb A1c MFr Bld 12/07/2016 6.6* 4.6 - 6.5 % Final  . Sodium 12/07/2016 135  135 - 145 mEq/L Final  . Potassium 12/07/2016  4.6  3.5 - 5.1 mEq/L Final  . Chloride 12/07/2016 101  96 - 112 mEq/L Final  . CO2 12/07/2016 30  19 - 32 mEq/L Final  . Glucose, Bld 12/07/2016 175* 70 - 99 mg/dL Final  . BUN 12/07/2016 13  6 - 23 mg/dL Final  . Creatinine, Ser 12/07/2016 1.13  0.40 - 1.50 mg/dL Final  . Total Bilirubin 12/07/2016 0.6  0.2 - 1.2 mg/dL Final  . Alkaline Phosphatase 12/07/2016 60  39 - 117 U/L Final  . AST 12/07/2016 18  0 - 37 U/L Final  . ALT 12/07/2016 7  0 - 53 U/L Final  . Total Protein 12/07/2016 7.2  6.0 - 8.3 g/dL Final  . Albumin 12/07/2016 4.2  3.5 - 5.2 g/dL Final  . Calcium 12/07/2016 9.3  8.4 - 10.5 mg/dL Final  . GFR 12/07/2016 67.98  >60.00 mL/min Final  . Microalb, Ur 12/07/2016 0.6  0.0 - 1.9 mg/dL Final  . Creatinine,U 12/07/2016 51.9  mg/dL Final  . Microalb Creat Ratio 12/07/2016 1.2  0.0 - 30.0 mg/g Final  . Cholesterol 12/07/2016 104  0 - 200 mg/dL Final  . Triglycerides 12/07/2016  67.0  0.0 - 149.0 mg/dL Final  . HDL 12/07/2016 34.90* >39.00 mg/dL Final  . VLDL 12/07/2016 13.4  0.0 - 40.0 mg/dL Final  . LDL Cholesterol 12/07/2016 56  0 - 99 mg/dL Final  . Total CHOL/HDL Ratio 12/07/2016 3   Final  . NonHDL 12/07/2016 68.90   Final    Allergies as of 12/10/2016      Reactions   Sulfa Antibiotics Swelling   Swelling of lips only.      Medication List       Accurate as of 12/10/16 10:44 AM. Always use your most recent med list.          ACCU-CHEK COMPACT PLUS test strip Generic drug:  glucose blood use 1 TEST STRIP once daily as directed   aspirin 81 MG tablet Take 81 mg by mouth daily.   atorvastatin 80 MG tablet Commonly known as:  LIPITOR Take 40 mg by mouth daily at 6 PM.   buPROPion 150 MG 24 hr tablet Commonly known as:  WELLBUTRIN XL TAKE 1 TABLET BY MOUTH EVERY DAY   carvedilol 12.5 MG tablet Commonly known as:  COREG TAKE ONE TABLET BY MOUTH TWICE DAILY WITH A MEAL   clobetasol ointment 0.05 % Commonly known as:  TEMOVATE Apply 1 application topically daily as needed (itching).   CoQ-10 200 MG Caps Take by mouth.   furosemide 40 MG tablet Commonly known as:  LASIX TAKE 1 TABLET BY MOUTH DAILY WITH BREAKFAST.   gabapentin 100 MG capsule Commonly known as:  NEURONTIN Take 1 capsule (100 mg total) by mouth 3 (three) times daily.   glipiZIDE 5 MG 24 hr tablet Commonly known as:  GLUCOTROL XL TAKE 1 TABLET BY MOUTH EVERY DAY WITH BREAKFAST   JANUMET XR 50-1000 MG Tb24 Generic drug:  SitaGLIPtin-MetFORMIN HCl TAKE 2 TABLETS BY MOUTH EVERY DAY   latanoprost 0.005 % ophthalmic solution Commonly known as:  XALATAN Place 1 drop into the right eye daily.   losartan 100 MG tablet Commonly known as:  COZAAR Take 50 mg by mouth daily.   spironolactone 25 MG tablet Commonly known as:  ALDACTONE Take 1 tablet (25 mg total) by mouth daily.   VIAGRA 100 MG tablet Generic drug:  sildenafil TAKE ONE TABLET BY MOUTH AS NEEDED FOR  ERECTILE DYSFUNCTION.  Allergies:  Allergies  Allergen Reactions  . Sulfa Antibiotics Swelling    Swelling of lips only.    Past Medical History:  Diagnosis Date  . Agent orange exposure    in [place]  . Anemia   . Heart block AV second degree    a. s/p PPM implant with subsequent CRTD upgrade  . High cholesterol   . History of colon polyps   . Hypertension   . LBBB (left bundle branch block)   . Nonischemic cardiomyopathy ([place])    a. MDT CRTD upgrade 2016  . Prostate cancer ([place])    "not been treated yet; on a wait and see" (01/22/2015)  . Type II diabetes mellitus ([place])     Past Surgical History:  Procedure Laterality Date  . BASAL CELL CARCINOMA EXCISION     "back, face, neck"  . BI-VENTRICULAR IMPLANTABLE CARDIOVERTER DEFIBRILLATOR UPGRADE N/A 01/22/2015   MDT CRTD upgrade by Dr Rayann Heman  . CARDIAC CATHETERIZATION  01/27/2012   normal coronaries, dilated LV c/w nonischemic cardiomyopathy  . EXAMINATION UNDER ANESTHESIA  04/06/2012   Procedure: EXAM UNDER ANESTHESIA;  Surgeon: Marcello Moores A. Cornett, MD;  Location: [place];  Service: General;  Laterality: N/A;  . INCISION AND DRAINAGE PERIRECTAL ABSCESS  ~ 2010; 2015  . INGUINAL HERNIA REPAIR Left 1980  . LEFT HEART CATHETERIZATION WITH CORONARY ANGIOGRAM N/A 01/27/2012   Procedure: LEFT HEART CATHETERIZATION WITH CORONARY ANGIOGRAM;  Surgeon: Troy Sine, MD;  Location: [facility] CATH LAB;  Service: Cardiovascular;  Laterality: N/A;  . NM MYOCAR PERF WALL MOTION  01/21/2012   mod-severe perfusion defect due to infarct/scar w/mild perinfarct ischemia in the apical,basal inferoseptal,basal inferior,mid inferoseptal,midinferior and apical inferior regions  . PERMANENT PACEMAKER INSERTION N/A 12/01/2011   Medtronic implanted by Dr Sallyanne Kuster  . PROSTATE BIOPSY    . PROSTATE BIOPSY    . shrapnel surgery     "in [place]"  . TONSILLECTOMY      Family History  Problem Relation Age of Onset  . Ovarian cancer Mother   . Leukemia  Father   . Heart disease Father   . Diabetes Neg Hx     Social History:  reports that he quit smoking about 32 years ago. His smoking use included Cigarettes. He has a 20.00 pack-year smoking history. He has never used smokeless tobacco. He reports that he drinks alcohol. He reports that he does not use drugs.    Review of Systems  .   He has eye exams every 6 months regularly at [facility] but no records available      Lipids: He has been treated with Lipitor 80 mg And also [facility]       Lab Results  Component Value Date   CHOL 104 12/07/2016   HDL 34.90 (L) 12/07/2016   LDLCALC 56 12/07/2016   LDLDIRECT 58.6 09/24/2014   TRIG 67.0 12/07/2016   CHOLHDL 3 12/07/2016     Hypertension/CHF: Mild and well controlled with losartan, Aldactone low-dose and carvedilol  Also taking Lasix and followed by cardiologist   Neuropathy: He has relief of the burning sensation in his feet with Gabapentin     Physical Examination:  BP 120/64   Pulse 74   Ht 6\' 1"  (1.854 m)   Wt 178 lb (80.7 kg)   SpO2 98%   BMI 23.48 kg/m   ASSESSMENT/PLAN:  Diabetes type 2, Nonobese  See history of present illness for detailed discussion of current diabetes management, blood sugar  patterns and problems identified     His A1c is improved at 6.6  However his blood sugars recently have been sporadically higher especially after meals He thinks this is partly related to stress and not watching diet consistently including more simple sugars. Also he is getting inadequate doses of glipizide with only 2.5 mg twice a day instead of the regular dosage of glipizide ER 5 mg daily Medications are periodically changed by the [facility] without notification Also he is not monitoring very much Recently not exercising for various reasons  For now will have him switch back to glipizide ER milligrams daily from the regular drug store Need to monitor at sugars more regularly  especially after meals Try to have balanced meals with less carbohydrates and sugar Start regular exercise Discussed that he may be needing additional medications if blood sugars are getting progressively higher  may consider Actos  HYPERLIPIDEMIA:  well controlled except for low HDL Followed by cardiologist and PCP   Patient Instructions  Change to Glipizide ER  Check more sugars   Taitum Menton 12/10/2016, 10:44 AM      Note: This office note was prepared with Dragon voice recognition system technology. Any transcriptional errors that result from this process are unintentional.

## 2016-12-11 ENCOUNTER — Other Ambulatory Visit: Payer: Self-pay | Admitting: Endocrinology

## 2016-12-16 ENCOUNTER — Ambulatory Visit (INDEPENDENT_AMBULATORY_CARE_PROVIDER_SITE_OTHER): Payer: Medicare HMO

## 2016-12-16 ENCOUNTER — Telehealth: Payer: Self-pay

## 2016-12-16 DIAGNOSIS — I5022 Chronic systolic (congestive) heart failure: Secondary | ICD-10-CM | POA: Diagnosis not present

## 2016-12-16 DIAGNOSIS — Z4502 Encounter for adjustment and management of automatic implantable cardiac defibrillator: Secondary | ICD-10-CM | POA: Diagnosis not present

## 2016-12-16 NOTE — Telephone Encounter (Signed)
Remote ICM transmission received.  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   Advised would call him back tomorrow.

## 2016-12-16 NOTE — Progress Notes (Signed)
EPIC Encounter for ICM Monitoring  Patient Name: Kevin [name] is a 71 y.o. male Date: 12/16/2016 Primary Care Physican: [name], MD Primary Cardiologist:[name] Electrophysiologist: [name] Dry Weight:179lbs Bi-V Pacing:  99.5%       Heart Failure questions reviewed, pt asymptomatic.  Thoracic impedance abnormal suggesting fluid accumulation since last ICM check 1/8/218.  Labs: 09/10/2016 Creatinine 1.07, BUN 25, Potassium 4.4, Sodium 135 09/02/2016 Creatinine 1.15, BUN 23, Potassium 4.3, Sodium 136  06/08/2016 Creatinine 1.14, BUN 18, Potassium 4.1, Sodium 134 04/16/2016 Creatinine 0.96, BUN 12, Potassium 4.4, Sodium 137 08/22/2015 Creatinine 0.98, BUN 16, Potassium 4.4, Sodium 138  Recommendations:  Spoke with wife.  She has only been giving patient Furosemide 40 mg 1/2 tablet a day (20 mg total).  She said when new prescription was filled she did not read the directions and left him on the same amount he was previously taking.  Advised her to give [name] and then start him on prescribed dosage of 1 tablet daily.    Follow-up plan: ICM clinic phone appointment on 12/21/2016 to recheck fluid levels.  Copy of ICM check sent to primary cardiologist and device physician.   3 month ICM trend: 12/16/2016   1 Year ICM trend:      Kevin Billings, RN 12/16/2016 9:06 AM

## 2016-12-17 NOTE — Progress Notes (Signed)
Understood, Thanks [employer]

## 2016-12-21 ENCOUNTER — Telehealth: Payer: Self-pay

## 2016-12-21 ENCOUNTER — Ambulatory Visit (INDEPENDENT_AMBULATORY_CARE_PROVIDER_SITE_OTHER): Payer: Medicare HMO

## 2016-12-21 DIAGNOSIS — Z4502 Encounter for adjustment and management of automatic implantable cardiac defibrillator: Secondary | ICD-10-CM

## 2016-12-21 DIAGNOSIS — I5022 Chronic systolic (congestive) heart failure: Secondary | ICD-10-CM

## 2016-12-21 NOTE — Progress Notes (Signed)
EPIC Encounter for ICM Monitoring  Patient Name: VALERIA BENEZRA is a 71 y.o. male Date: 12/21/2016 Primary Care Physican: [name], MD Primary Cardiologist:[name] Electrophysiologist: [name] Dry Weight:unknown Bi-V Pacing:  99.5%            Attempted call to patient and unable to reach.  Left detailed message regarding transmission.  Transmission reviewed.    Thoracic impedance returned to normal after taking correct prescribed Furosemide dosage.    Labs: 09/10/2016 Creatinine 1.07, BUN 25, Potassium 4.4, Sodium 135 09/02/2016 Creatinine 1.15, BUN 23, Potassium 4.3, Sodium 136  06/08/2016 Creatinine 1.14, BUN 18, Potassium 4.1, Sodium 134 04/16/2016 Creatinine 0.96, BUN 12, Potassium 4.4, Sodium 137 08/22/2015 Creatinine 0.98, BUN 16, Potassium 4.4, Sodium 138  Recommendations:  Left voice mail with ICM number and encouraged to call for fluid symptoms.  Follow-up plan: ICM clinic phone appointment on 01/17/2017.  Copy of ICM check sent to primary cardiologist and device physician.   3 month ICM trend: 12/21/2016   1 Year ICM trend:      Rosalene Billings, RN 12/21/2016 8:14 AM

## 2016-12-21 NOTE — Telephone Encounter (Signed)
Remote ICM transmission received.  Attempted patient call and left detailed message regarding transmission and next ICM scheduled for 01/17/2017.  Advised to return call for any fluid symptoms or questions.    

## 2016-12-21 NOTE — Progress Notes (Signed)
Good MCr

## 2017-01-13 ENCOUNTER — Ambulatory Visit (INDEPENDENT_AMBULATORY_CARE_PROVIDER_SITE_OTHER): Payer: Medicare HMO | Admitting: *Deleted

## 2017-01-13 DIAGNOSIS — I428 Other cardiomyopathies: Secondary | ICD-10-CM

## 2017-01-13 LAB — CUP PACEART INCLINIC DEVICE CHECK
Battery Remaining Longevity: 35 mo
Battery Voltage: 2.95 V
Brady Statistic AP VP Percent: 1.37 %
Brady Statistic RA Percent Paced: 1.37 %
Brady Statistic RV Percent Paced: 99.72 %
HIGH POWER IMPEDANCE MEASURED VALUE: 80 Ohm
Implantable Lead Implant Date: 20160316
Implantable Lead Location: 753859
Implantable Lead Location: 753860
Implantable Lead Model: 4598
Implantable Lead Model: 5076
Implantable Lead Model: 6935
Lead Channel Impedance Value: 1121 Ohm
Lead Channel Impedance Value: 437 Ohm
Lead Channel Impedance Value: 589 Ohm
Lead Channel Impedance Value: 684 Ohm
Lead Channel Impedance Value: 684 Ohm
Lead Channel Impedance Value: 988 Ohm
Lead Channel Impedance Value: 988 Ohm
Lead Channel Pacing Threshold Amplitude: 0.5 V
Lead Channel Sensing Intrinsic Amplitude: 12 mV
Lead Channel Sensing Intrinsic Amplitude: 12 mV
Lead Channel Sensing Intrinsic Amplitude: 4.125 mV
Lead Channel Setting Pacing Amplitude: 2.5 V
Lead Channel Setting Pacing Amplitude: 3.5 V
Lead Channel Setting Pacing Pulse Width: 1 ms
MDC IDC LEAD IMPLANT DT: 20130123
MDC IDC LEAD IMPLANT DT: 20160316
MDC IDC LEAD LOCATION: 753858
MDC IDC MSMT LEADCHNL LV IMPEDANCE VALUE: 1121 Ohm
MDC IDC MSMT LEADCHNL LV IMPEDANCE VALUE: 1178 Ohm
MDC IDC MSMT LEADCHNL LV IMPEDANCE VALUE: 513 Ohm
MDC IDC MSMT LEADCHNL LV IMPEDANCE VALUE: 589 Ohm
MDC IDC MSMT LEADCHNL LV PACING THRESHOLD AMPLITUDE: 3 V
MDC IDC MSMT LEADCHNL LV PACING THRESHOLD PULSEWIDTH: 1 ms
MDC IDC MSMT LEADCHNL RA IMPEDANCE VALUE: 418 Ohm
MDC IDC MSMT LEADCHNL RA PACING THRESHOLD AMPLITUDE: 0.5 V
MDC IDC MSMT LEADCHNL RA PACING THRESHOLD PULSEWIDTH: 0.4 ms
MDC IDC MSMT LEADCHNL RA SENSING INTR AMPL: 3.875 mV
MDC IDC MSMT LEADCHNL RV IMPEDANCE VALUE: 494 Ohm
MDC IDC MSMT LEADCHNL RV PACING THRESHOLD PULSEWIDTH: 0.4 ms
MDC IDC PG IMPLANT DT: 20160316
MDC IDC SESS DTM: 20180308115004
MDC IDC SET LEADCHNL RA PACING AMPLITUDE: 2 V
MDC IDC SET LEADCHNL RV PACING PULSEWIDTH: 0.4 ms
MDC IDC SET LEADCHNL RV SENSING SENSITIVITY: 0.3 mV
MDC IDC STAT BRADY AP VS PERCENT: 0.01 %
MDC IDC STAT BRADY AS VP PERCENT: 98.48 %
MDC IDC STAT BRADY AS VS PERCENT: 0.15 %

## 2017-01-13 NOTE — Progress Notes (Signed)
pt seen d/t c/o of sensations related to device, unable to describe, but when diaphragmatic stim was described which pt has had in the past, pt denied those symptoms. No recent VT or AF episodes. Normal device function. Thresholds and sensing consistent with previous device measurements. Impedance trends stable over time. Pt voiced appreciation of device check and reassurance that device was working normally.

## 2017-01-17 ENCOUNTER — Ambulatory Visit (INDEPENDENT_AMBULATORY_CARE_PROVIDER_SITE_OTHER): Payer: Medicare HMO | Admitting: *Deleted

## 2017-01-17 DIAGNOSIS — I428 Other cardiomyopathies: Secondary | ICD-10-CM

## 2017-01-17 DIAGNOSIS — I5022 Chronic systolic (congestive) heart failure: Secondary | ICD-10-CM | POA: Diagnosis not present

## 2017-01-17 DIAGNOSIS — Z4502 Encounter for adjustment and management of automatic implantable cardiac defibrillator: Secondary | ICD-10-CM | POA: Diagnosis not present

## 2017-01-17 NOTE — Progress Notes (Signed)
Thank you MCr 

## 2017-01-17 NOTE — Progress Notes (Addendum)
EPIC Encounter for ICM Monitoring  Patient Name: Kevin [name] is a 71 y.o. male Date: 01/17/2017 Primary Care Physican: [name], MD Primary Cardiologist:[name] Electrophysiologist: [name] Dry Weight:unknown Bi-V Pacing: 99.3%      Heart Failure questions reviewed, pt asymptomatic   Thoracic impedance slightly abnormal suggesting fluid accumulation and trending just below baseline.  Prescribed and confirmed dosage: Furosemide 40 mg 1 tablet daily.  Labs: 12/07/2016 Creatinine 1.13, BUN 13, Potassium 4.6, Sodium 135 09/10/2016 Creatinine 1.07, BUN 25, Potassium 4.4, Sodium 135 09/02/2016 Creatinine 1.15, BUN 23, Potassium 4.3, Sodium 136  06/08/2016 Creatinine 1.14, BUN 18, Potassium 4.1, Sodium 134 04/16/2016 Creatinine 0.96, BUN 12, Potassium 4.4, Sodium 137 08/22/2015 Creatinine 0.98, BUN 16, Potassium 4.4, Sodium 138  Recommendations: No changes. Reminded to limit dietary salt intake to 2000 mg/day and fluid intake to < 2 liters/day. Encouraged to call for fluid symptoms.  Follow-up plan: ICM clinic phone appointment on 02/22/2017.  Copy of ICM check sent to cardiologist and device physician.   3 month ICM trend: 01/17/2017    1 Year ICM trend:      Rosalene Billings, RN 01/17/2017 11:15 AM

## 2017-01-18 LAB — CUP PACEART REMOTE DEVICE CHECK
Battery Voltage: 2.93 V
Brady Statistic AP VP Percent: 4.68 %
Brady Statistic AS VP Percent: 95.08 %
Brady Statistic AS VS Percent: 0.24 %
Brady Statistic RV Percent Paced: 99.32 %
HIGH POWER IMPEDANCE MEASURED VALUE: 83 Ohm
Implantable Lead Implant Date: 20130123
Implantable Lead Implant Date: 20160316
Implantable Lead Location: 753859
Implantable Lead Model: 4598
Implantable Lead Model: 5076
Implantable Lead Model: 6935
Lead Channel Impedance Value: 1140 Ohm
Lead Channel Impedance Value: 418 Ohm
Lead Channel Impedance Value: 437 Ohm
Lead Channel Impedance Value: 570 Ohm
Lead Channel Impedance Value: 570 Ohm
Lead Channel Impedance Value: 703 Ohm
Lead Channel Impedance Value: 874 Ohm
Lead Channel Pacing Threshold Amplitude: 0.5 V
Lead Channel Pacing Threshold Amplitude: 3.25 V
Lead Channel Pacing Threshold Pulse Width: 0.4 ms
Lead Channel Sensing Intrinsic Amplitude: 12 mV
Lead Channel Sensing Intrinsic Amplitude: 4.25 mV
Lead Channel Sensing Intrinsic Amplitude: 4.25 mV
Lead Channel Setting Pacing Amplitude: 4 V
Lead Channel Setting Sensing Sensitivity: 0.3 mV
MDC IDC LEAD IMPLANT DT: 20160316
MDC IDC LEAD LOCATION: 753858
MDC IDC LEAD LOCATION: 753860
MDC IDC MSMT BATTERY REMAINING LONGEVITY: 32 mo
MDC IDC MSMT LEADCHNL LV IMPEDANCE VALUE: 1064 Ohm
MDC IDC MSMT LEADCHNL LV IMPEDANCE VALUE: 1140 Ohm
MDC IDC MSMT LEADCHNL LV IMPEDANCE VALUE: 437 Ohm
MDC IDC MSMT LEADCHNL LV IMPEDANCE VALUE: 646 Ohm
MDC IDC MSMT LEADCHNL LV IMPEDANCE VALUE: 912 Ohm
MDC IDC MSMT LEADCHNL LV PACING THRESHOLD PULSEWIDTH: 1 ms
MDC IDC MSMT LEADCHNL RA PACING THRESHOLD AMPLITUDE: 0.5 V
MDC IDC MSMT LEADCHNL RA PACING THRESHOLD PULSEWIDTH: 0.4 ms
MDC IDC MSMT LEADCHNL RV IMPEDANCE VALUE: 494 Ohm
MDC IDC MSMT LEADCHNL RV SENSING INTR AMPL: 12 mV
MDC IDC PG IMPLANT DT: 20160316
MDC IDC SESS DTM: 20180312062824
MDC IDC SET LEADCHNL LV PACING PULSEWIDTH: 1 ms
MDC IDC SET LEADCHNL RA PACING AMPLITUDE: 2 V
MDC IDC SET LEADCHNL RV PACING AMPLITUDE: 2.5 V
MDC IDC SET LEADCHNL RV PACING PULSEWIDTH: 0.4 ms
MDC IDC STAT BRADY AP VS PERCENT: 0.01 %
MDC IDC STAT BRADY RA PERCENT PACED: 4.59 %

## 2017-01-18 NOTE — Progress Notes (Signed)
Remote ICD transmission.   

## 2017-01-19 ENCOUNTER — Encounter: Payer: Self-pay | Admitting: Cardiology

## 2017-01-23 ENCOUNTER — Other Ambulatory Visit: Payer: Self-pay | Admitting: Family Medicine

## 2017-02-02 ENCOUNTER — Other Ambulatory Visit (INDEPENDENT_AMBULATORY_CARE_PROVIDER_SITE_OTHER): Payer: Medicare HMO

## 2017-02-02 DIAGNOSIS — E1165 Type 2 diabetes mellitus with hyperglycemia: Secondary | ICD-10-CM

## 2017-02-02 LAB — BASIC METABOLIC PANEL
BUN: 20 mg/dL (ref 6–23)
CHLORIDE: 100 meq/L (ref 96–112)
CO2: 30 meq/L (ref 19–32)
CREATININE: 1.34 mg/dL (ref 0.40–1.50)
Calcium: 9.2 mg/dL (ref 8.4–10.5)
GFR: 55.82 mL/min — ABNORMAL LOW (ref >60.00)
Glucose, Bld: 141 mg/dL — ABNORMAL HIGH (ref 70–99)
POTASSIUM: 4.1 meq/L (ref 3.5–5.1)
Sodium: 137 mEq/L (ref 135–145)

## 2017-02-03 ENCOUNTER — Other Ambulatory Visit: Payer: Medicare HMO

## 2017-02-03 ENCOUNTER — Encounter: Payer: Self-pay | Admitting: Cardiology

## 2017-02-03 LAB — FRUCTOSAMINE: Fructosamine: 255 umol/L (ref 0–285)

## 2017-02-07 ENCOUNTER — Ambulatory Visit: Payer: Medicare HMO | Admitting: Endocrinology

## 2017-02-10 NOTE — Progress Notes (Signed)
Please schedule patient for follow-up, no appointment made

## 2017-02-22 ENCOUNTER — Ambulatory Visit (INDEPENDENT_AMBULATORY_CARE_PROVIDER_SITE_OTHER): Payer: Medicare HMO

## 2017-02-22 DIAGNOSIS — I5022 Chronic systolic (congestive) heart failure: Secondary | ICD-10-CM

## 2017-02-22 DIAGNOSIS — Z4502 Encounter for adjustment and management of automatic implantable cardiac defibrillator: Secondary | ICD-10-CM

## 2017-02-22 NOTE — Progress Notes (Signed)
EPIC Encounter for ICM Monitoring  Patient Name: Kevin [name] is a 71 y.o. male Date: 02/22/2017 Primary Care Physican: [name], MD Primary Cardiologist:[name] Electrophysiologist: [name] Dry Weight:unknown Bi-V Pacing: 99%      Heart Failure questions reviewed, pt asymptomatic.   Thoracic impedance abnormal suggesting fluid accumulation since 02/16/2017.  Prescribed and confirmed dosage: Furosemide 40 mg 1 tablet daily.  Labs: 12/07/2016 Creatinine 1.13, BUN 13, Potassium 4.6, Sodium 135 09/10/2016 Creatinine 1.07, BUN 25, Potassium 4.4, Sodium 135 09/02/2016 Creatinine 1.15, BUN 23, Potassium 4.3, Sodium 136  06/08/2016 Creatinine 1.14, BUN 18, Potassium 4.1, Sodium 134 04/16/2016 Creatinine 0.96, BUN 12, Potassium 4.4, Sodium 137      08/22/2015  Creatinine 0.98, BUN 16, Potassium 4.4, Sodium 138  Recommendations:  He said he would rather limit salt for the next week instead of taking any extra fluid pills.  He said if he still has fluid next week he will take the extra medication.   Follow-up plan: ICM clinic phone appointment on 03/01/2017 to recheck fluid levels.   Copy of ICM check sent to primary cardiologist and device physician.   3 month ICM trend: 02/22/2017   1 Year ICM trend:      Rosalene Billings, RN 02/22/2017 10:55 AM

## 2017-03-01 ENCOUNTER — Ambulatory Visit (INDEPENDENT_AMBULATORY_CARE_PROVIDER_SITE_OTHER): Payer: Self-pay

## 2017-03-01 DIAGNOSIS — I5022 Chronic systolic (congestive) heart failure: Secondary | ICD-10-CM

## 2017-03-01 DIAGNOSIS — Z4502 Encounter for adjustment and management of automatic implantable cardiac defibrillator: Secondary | ICD-10-CM

## 2017-03-02 ENCOUNTER — Telehealth: Payer: Self-pay | Admitting: Cardiology

## 2017-03-02 NOTE — Telephone Encounter (Signed)
Spoke with pt and reminded pt of remote transmission that is due today. Pt verbalized understanding.   

## 2017-03-03 ENCOUNTER — Telehealth: Payer: Self-pay

## 2017-03-03 NOTE — Telephone Encounter (Signed)
Remote ICM transmission received.  Attempted patient call and left message to return call.   

## 2017-03-03 NOTE — Progress Notes (Signed)
EPIC Encounter for ICM Monitoring  Patient Name: Kevin [name] is a 71 y.o. male Date: 03/03/2017 Primary Care Physican: [name], MD Primary Cardiologist:[name] Electrophysiologist: [name] Dry Weight:unknown Bi-V Pacing: 99.2%        Attempted call to patient and unable to reach.  Left message to return call.  Transmission reviewed.    Thoracic impedance is worse since last ICM transmission on 02/22/2017 and is abnormal suggesting fluid accumulation since 02/19/2017.  Prescribed and confirmed dosage: Furosemide 40 mg 1 tablet daily.  Labs: 02/02/2017 Creatinine 1.34, BUN 20, Potassium 4.1, Sodium 137, EGFR 55.82 12/07/2016 Creatinine 1.13, BUN 13, Potassium 4.6, Sodium 135 09/10/2016 Creatinine 1.07, BUN 25, Potassium 4.4, Sodium 135 09/02/2016 Creatinine 1.15, BUN 23, Potassium 4.3, Sodium 136  06/08/2016 Creatinine 1.14, BUN 18, Potassium 4.1, Sodium 134 04/16/2016 Creatinine 0.96, BUN 12, Potassium 4.4, Sodium 137 08/22/2015  Creatinine 0.98, BUN 16, Potassium 4.4, Sodium 138*  Recommendations: Copy of ICM check sent to Dr Sallyanne Kuster and Dr Rayann Heman for review and recommendations.   Follow-up plan: ICM clinic phone appointment on 03/10/2017 to recheck fluid levels   3 month ICM trend: 03/03/2017   1 Year ICM trend:      Rosalene Billings, RN 03/03/2017 3:58 PM

## 2017-03-04 NOTE — Progress Notes (Signed)
Attempted 2 calls to patient and left message for return call.

## 2017-03-10 ENCOUNTER — Telehealth: Payer: Self-pay

## 2017-03-10 ENCOUNTER — Ambulatory Visit (INDEPENDENT_AMBULATORY_CARE_PROVIDER_SITE_OTHER): Payer: Self-pay

## 2017-03-10 DIAGNOSIS — I5022 Chronic systolic (congestive) heart failure: Secondary | ICD-10-CM

## 2017-03-10 DIAGNOSIS — Z4502 Encounter for adjustment and management of automatic implantable cardiac defibrillator: Secondary | ICD-10-CM

## 2017-03-10 NOTE — Progress Notes (Signed)
Thanks MCr 

## 2017-03-10 NOTE — Telephone Encounter (Signed)
Remote ICM transmission received.  Attempted patient call and left message to return call.   

## 2017-03-10 NOTE — Progress Notes (Signed)
EPIC Encounter for ICM Monitoring  Patient Name: Kevin [name] is a 71 y.o. male Date: 03/10/2017 Primary Care Physican: [name], MD Primary Cardiologist:[name] Electrophysiologist: [name] Dry Weight:177 lbs Bi-V Pacing:  99.7%       Heart failure questions reviewed and pt is asymptomatic.Marland Kitchen    Prescribed Furosemide 40 mg 1 tablet daily  Thoracic impedance abnormal suggesting fluid accumulation since 02/18/2017.  Labs: 02/02/2017 Creatinine 1.34, BUN 20, Potassium 4.1, Sodium 137, EGFR 55.82 12/07/2016 Creatinine 1.13, BUN 13, Potassium 4.6, Sodium 135 09/10/2016 Creatinine 1.07, BUN 25, Potassium 4.4, Sodium 135 09/02/2016 Creatinine 1.15, BUN 23, Potassium 4.3, Sodium 136  06/08/2016 Creatinine 1.14, BUN 18, Potassium 4.1, Sodium 134 04/16/2016 Creatinine 0.96, BUN 12, Potassium 4.4, Sodium 137 08/22/2015 Creatinine 0.98, BUN 16, Potassium 4.4, Sodium 138  Recommendations:  Copy of ICM check sent to Dr Sallyanne Kuster and Dr Rayann Heman for review and if any recommendations will call him back.    Follow-up plan: ICM clinic phone appointment on 03/14/2017.   3 month ICM trend: 03/10/2017   1 Year ICM trend:      Rosalene Billings, RN 03/10/2017 8:17 AM

## 2017-03-10 NOTE — Progress Notes (Signed)
Patient left voice mail message stating he had forgotten he has been out of Furosemide and has not taken any in the last 6 days.  He stated he was going to pick up the prescription today.

## 2017-03-14 ENCOUNTER — Telehealth: Payer: Self-pay | Admitting: Cardiology

## 2017-03-14 NOTE — Telephone Encounter (Signed)
LMOVM reminding pt to send remote transmission.   

## 2017-03-15 ENCOUNTER — Telehealth: Payer: Self-pay | Admitting: *Deleted

## 2017-03-15 NOTE — Telephone Encounter (Signed)
On 03-15-17 fax medical records epi source

## 2017-03-22 NOTE — Progress Notes (Signed)
No ICM remote transmission received for 03/14/2017 and next ICM transmission scheduled for 04/05/2017.

## 2017-03-31 ENCOUNTER — Other Ambulatory Visit: Payer: Self-pay | Admitting: Endocrinology

## 2017-04-05 ENCOUNTER — Ambulatory Visit (INDEPENDENT_AMBULATORY_CARE_PROVIDER_SITE_OTHER): Payer: Medicare HMO

## 2017-04-05 DIAGNOSIS — Z4502 Encounter for adjustment and management of automatic implantable cardiac defibrillator: Secondary | ICD-10-CM | POA: Diagnosis not present

## 2017-04-05 DIAGNOSIS — I5022 Chronic systolic (congestive) heart failure: Secondary | ICD-10-CM

## 2017-04-05 NOTE — Progress Notes (Signed)
EPIC Encounter for ICM Monitoring  Patient Name: Kevin [name] is a 71 y.o. male Date: 04/05/2017 Primary Care Physican: [name], MD Primary Cardiologist:[name] Electrophysiologist: [name] Dry Weight:178 lbs Bi-V Pacing:  99.5%         Heart Failure questions reviewed, pt asymptomatic.   Thoracic impedance almost at baseline today normal but was abnormal suggesting fluid accumulation 03/31/2017 and started returning to baseline 04/01/2017.  Prescribed Furosemide 40 mg 1 tablet daily  Thoracic impedance abnormal suggesting fluid accumulation since 02/18/2017.  Labs: 02/02/2017 Creatinine 1.34, BUN 20, Potassium 4.1, Sodium 137, EGFR 55.82 12/07/2016 Creatinine 1.13, BUN 13, Potassium 4.6, Sodium 135 09/10/2016 Creatinine 1.07, BUN 25, Potassium 4.4, Sodium 135 09/02/2016 Creatinine 1.15, BUN 23, Potassium 4.3, Sodium 136  06/08/2016 Creatinine 1.14, BUN 18, Potassium 4.1, Sodium 134 04/16/2016 Creatinine 0.96, BUN 12, Potassium 4.4, Sodium 137 08/22/2015 Creatinine 0.98, BUN 16, Potassium 4.4, Sodium 138  Recommendations: No changes. Discussed to limit salt intake to 2000 mg/day and fluid intake to < 2 liters/day.  Encouraged to call for fluid symptoms or use local ER for any urgent symptoms.  Follow-up plan: ICM clinic phone appointment on 05/06/2017.  Office appointment scheduled on 05/16/2017 with Dr. Rayann Heman.    Copy of ICM check sent to primary cardiologist and device physician.   3 month ICM trend: 04/05/2017   1 Year ICM trend:      Rosalene Billings, RN 04/05/2017 11:07 AM

## 2017-04-28 ENCOUNTER — Encounter: Payer: Self-pay | Admitting: Internal Medicine

## 2017-04-28 ENCOUNTER — Other Ambulatory Visit: Payer: Self-pay | Admitting: Endocrinology

## 2017-05-06 ENCOUNTER — Ambulatory Visit (INDEPENDENT_AMBULATORY_CARE_PROVIDER_SITE_OTHER): Payer: Medicare HMO

## 2017-05-06 DIAGNOSIS — I5022 Chronic systolic (congestive) heart failure: Secondary | ICD-10-CM

## 2017-05-06 DIAGNOSIS — Z4502 Encounter for adjustment and management of automatic implantable cardiac defibrillator: Secondary | ICD-10-CM | POA: Diagnosis not present

## 2017-05-06 NOTE — Progress Notes (Signed)
EPIC Encounter for ICM Monitoring  Patient Name: Kevin [name] is a 71 y.o. male Date: 05/06/2017 Primary Care Physican: Burchette, Bruce W, MD Primary Cardiologist:[name] Electrophysiologist: [name] Dry Weight:180 lbs Bi-V Pacing: 99.5%        Heart Failure questions reviewed, pt asymptomatic.   Thoracic impedance abnormal suggesting fluid accumulation.  Prescribed Furosemide 40 mg 1 tablet daily  Labs: 02/02/2017 Creatinine 1.34, BUN 20, Potassium 4.1, Sodium 137, EGFR 55.82 12/07/2016 Creatinine 1.13, BUN 13, Potassium 4.6, Sodium 135 09/10/2016 Creatinine 1.07, BUN 25, Potassium 4.4, Sodium 135 09/02/2016 Creatinine 1.15, BUN 23, Potassium 4.3, Sodium 136  06/08/2016 Creatinine 1.14, BUN 18, Potassium 4.1, Sodium 134 04/16/2016 Creatinine 0.96, BUN 12, Potassium 4.4, Sodium 137 08/22/2015 Creatinine 0.98, BUN 16, Potassium 4.4, Sodium 138  Recommendations:  Copy of ICM check sent to Dr [name] and Dr [name] and if any recommendations will call him back.   Follow-up plan: ICM clinic phone appointment on 06/16/2017 since he has a defib office appointment scheduled 05/16/2017 with Dr. [name].  3 month ICM trend: 05/06/2017   1 Year ICM trend:      Laurie S Short, RN 05/06/2017 7:37 AM    

## 2017-05-16 ENCOUNTER — Telehealth: Payer: Self-pay | Admitting: Family Medicine

## 2017-05-16 ENCOUNTER — Encounter: Payer: Medicare HMO | Admitting: Internal Medicine

## 2017-05-16 NOTE — Telephone Encounter (Signed)
error 

## 2017-05-31 NOTE — Progress Notes (Signed)
Cardiology Office Note Date:  06/01/2017  Patient ID:  Kevin [name], Kevin [name] 1946-10-17, MRN [id number] PCP:  [name], MD  Endo: Dr. Dwyane Dee Electrophysiologist: Dr. Rayann Heman    Chief Complaint: annual visit  History of Present Illness: Kevin [name] is a 71 y.o. male with history of PPM with alternation RBBB/LBBB AV block, DM, HTN, NICM with cath 2013 without CAD, 2016 saw EPO, Dr. Rayann Heman for worsening CHF, he did and echo revealed EF 25-30%, myoview revealed large scar with peri-infarct ischemia unchanged from study 2014 and subsequently underwent upgrade to CRT-D.  He comes in today to be seen for Dr. Rayann Heman, last saw EP service, A. Lynnell Jude, NP June 2017 at that time doing well, reprogramming LV lead in effort to eliminate diaphrag stim and keep thresholds reasonable  He is feeling well, denies any CP, palpitations or SOB, no near syncope or syncope.  His weight is up here but he reports his home weights have been stable, denies any SOB, DOE, no symptoms of PND or orthopnea, no difficulty with ADLs.  He mentions as an after thought that on a couple of occassions when golfing has felt a fleeting dizziness, not lightheaded or like he would faint, but dizzy.  He will infrequently feel similar upon standing up.  He tells mehis PMD monitors and manages his cholesterol.   Device History: MDT dual chamber pacemaker implanted 11/2011 for Mobtiz II heart block (Dr C); upgrade to MDT CRTD 01/2015 for non ischemic cardiomyopathy, CHF, pacing induced LBBB by Dr Rayann Heman History of appropriate therapy: No History of AAD therapy: No  Past Medical History:  Diagnosis Date  . Agent orange exposure    in [place]  . Anemia   . Heart block AV second degree    a. s/p PPM implant with subsequent CRTD upgrade  . High cholesterol   . History of colon polyps   . Hypertension   . LBBB (left bundle branch block)   . Nonischemic cardiomyopathy ([place])    a. MDT CRTD upgrade 2016  . Prostate  cancer ([place])    "not been treated yet; on a wait and see" (01/22/2015)  . Type II diabetes mellitus ([place])     Past Surgical History:  Procedure Laterality Date  . BASAL CELL CARCINOMA EXCISION     "back, face, neck"  . BI-VENTRICULAR IMPLANTABLE CARDIOVERTER DEFIBRILLATOR UPGRADE N/A 01/22/2015   MDT CRTD upgrade by Dr Rayann Heman  . CARDIAC CATHETERIZATION  01/27/2012   normal coronaries, dilated LV c/w nonischemic cardiomyopathy  . EXAMINATION UNDER ANESTHESIA  04/06/2012   Procedure: EXAM UNDER ANESTHESIA;  Surgeon: Marcello Moores A. Cornett, MD;  Location: [place];  Service: General;  Laterality: N/A;  . INCISION AND DRAINAGE PERIRECTAL ABSCESS  ~ 2010; 2015  . INGUINAL HERNIA REPAIR Left 1980  . LEFT HEART CATHETERIZATION WITH CORONARY ANGIOGRAM N/A 01/27/2012   Procedure: LEFT HEART CATHETERIZATION WITH CORONARY ANGIOGRAM;  Surgeon: Troy Sine, MD;  Location: [facility] CATH LAB;  Service: Cardiovascular;  Laterality: N/A;  . NM MYOCAR PERF WALL MOTION  01/21/2012   mod-severe perfusion defect due to infarct/scar w/mild perinfarct ischemia in the apical,basal inferoseptal,basal inferior,mid inferoseptal,midinferior and apical inferior regions  . PERMANENT PACEMAKER INSERTION N/A 12/01/2011   Medtronic implanted by Dr Sallyanne Kuster  . PROSTATE BIOPSY    . PROSTATE BIOPSY    . shrapnel surgery     "in [place]"  . TONSILLECTOMY      Current Outpatient Prescriptions  Medication Sig Dispense Refill  .  ACCU-CHEK COMPACT PLUS test strip use 1 TEST STRIP once daily as directed 102 each 5  . aspirin 81 MG tablet Take 81 mg by mouth daily.      Marland Kitchen atorvastatin (LIPITOR) 80 MG tablet Take 40 mg by mouth daily at 6 PM.    . buPROPion (WELLBUTRIN XL) 150 MG 24 hr tablet TAKE 1 TABLET BY MOUTH EVERY DAY 90 tablet 0  . carvedilol (COREG) 12.5 MG tablet TAKE ONE TABLET BY MOUTH TWICE DAILY WITH A MEAL 180 tablet 0  . clobetasol ointment (TEMOVATE) 6.30 % Apply 1 application topically daily as needed (itching).     .  Coenzyme Q10 (COQ-10) 200 MG CAPS Take by mouth.    . furosemide (LASIX) 40 MG tablet TAKE 1 TABLET BY MOUTH DAILY WITH BREAKFAST. 30 tablet 5  . gabapentin (NEURONTIN) 100 MG capsule Take 1 capsule (100 mg total) by mouth 3 (three) times daily. 60 capsule 3  . glipiZIDE (GLUCOTROL XL) 5 MG 24 hr tablet TAKE 1 TABLET BY MOUTH EVERY DAY WITH BREAKFAST 30 tablet 3  . JANUMET XR 50-1000 MG TB24 TAKE 2 TABLETS BY MOUTH EVERY DAY 60 tablet 0  . latanoprost (XALATAN) 0.005 % ophthalmic solution Place 1 drop into the right eye daily.     Marland Kitchen losartan (COZAAR) 100 MG tablet Take 50 mg by mouth daily.    Marland Kitchen VIAGRA 100 MG tablet TAKE ONE TABLET BY MOUTH AS NEEDED FOR ERECTILE DYSFUNCTION. 10 tablet 3  . spironolactone (ALDACTONE) 25 MG tablet Take 1 tablet (25 mg total) by mouth daily. 90 tablet 3   No current facility-administered medications for this visit.     Allergies:   Sulfa antibiotics   Social History:  The patient  reports that he quit smoking about 33 years ago. His smoking use included Cigarettes. He has a 20.00 pack-year smoking history. He has never used smokeless tobacco. He reports that he drinks alcohol. He reports that he does not use drugs.   Family History:  The patient's family history includes Heart disease in his father; Leukemia in his father; Ovarian cancer in his mother.  ROS:  Please see the history of present illness.  All other systems are reviewed and otherwise negative.   PHYSICAL EXAM:  VS:  BP 116/68   Pulse 71   Ht 6\' 1"  (1.854 m)   Wt 183 lb (83 kg)   BMI 24.14 kg/m  BMI: Body mass index is 24.14 kg/m. Well nourished, well developed, in no acute distress  HEENT: normocephalic, atraumatic  Neck: no JVD, carotid bruits or masses Cardiac:  RRR; no significant murmurs, no rubs, or gallops Lungs:  CTA b/l, no wheezing, rhonchi or rales  Abd: soft, nontender MS: no deformity, age appropriate atrophy Ext:  no edema  Skin: warm and dry, no rash Neuro:  No gross  deficits appreciated Psych: euthymic mood, full affect  ICD site is stable, no tethering or discomfort   EKG:  Done today and reviewed by myself shows SR, V paced ICD interrogation dine today by industry and reviewed by myself: battery and lead mesurements are stable, LV lead threshold looks chronic, no arrhythmias or device observation, 99.4% BiVe paced, Optivol is back down, has had a couple recent upward trends  05/05/16: TTE Study Conclusions - Left ventricle: The cavity size was normal. There was mild   concentric hypertrophy. Systolic function was moderately reduced.   The estimated ejection fraction was in the range of 35% to 40%.   Diffuse  hypokinesis. Doppler parameters are consistent with   abnormal left ventricular relaxation (grade 1 diastolic   dysfunction). Doppler parameters are consistent with   indeterminate ventricular filling pressure. - Ventricular septum: Septal motion showed abnormal function and   dyssynergy. - Aortic valve: There was no regurgitation. - Mitral valve: There was no regurgitation. - Right ventricle: The cavity size was normal. Wall thickness was   normal. Systolic function was normal. - Atrial septum: No defect or patent foramen ovale was identified   by color flow Doppler. - Tricuspid valve: There was mild regurgitation. - Pulmonary arteries: Systolic pressure was within the normal   range. PA peak pressure: 25 mm Hg (S).  Recent Labs: 09/10/2016: Hemoglobin 15.3; Platelets 205.0 12/07/2016: ALT 7 02/02/2017: BUN 20; Creatinine, Ser 1.34; Potassium 4.1; Sodium 137  12/07/2016: Cholesterol 104; HDL 34.90; LDL Cholesterol 56; Total CHOL/HDL Ratio 3; Triglycerides 67.0; VLDL 13.4   CrCl cannot be calculated (Patient's most recent lab result is older than the maximum 21 days allowed.).   Wt Readings from Last 3 Encounters:  06/01/17 183 lb (83 kg)  12/10/16 178 lb (80.7 kg)  11/09/16 185 lb 1.6 oz (84 kg)     Other studies  reviewed: Additional studies/records reviewed today include: summarized above  ASSESSMENT AND PLAN:  1. ICD     Stable device function, no changes made  2. NICM      Exam and OptiVol do not suggest fluid OL     He describes what sounds like infrequent orthostatic symptoms, cautioned given his diuretic, need to keep adequately hydrated to avoid dehydration balanced with his diuretics/ability to retain fluid as well     Discussed daily weights, being aware of symptoms, and if this becomes recurrent/frequent, or escalates to let us know     No changes for now     On BB/ ARB, diuretic tx  3. Hx of CHB     ICD functioning appropriately  4. HTN     No changes  Disposition: F/u with Q 3 month remote device checks, one year in clinic, sooner if needed  Current medicines are reviewed at length with the patient today.  The patient did not have any concerns regarding medicines.  Haywood Lasso, PA-C 06/01/2017 11:28 AM     [address] [place] [id number] [phone] (office)  [phone] (fax)

## 2017-06-01 ENCOUNTER — Ambulatory Visit (INDEPENDENT_AMBULATORY_CARE_PROVIDER_SITE_OTHER): Payer: Medicare HMO | Admitting: Physician Assistant

## 2017-06-01 VITALS — BP 116/68 | HR 71 | Ht 73.0 in | Wt 183.0 lb

## 2017-06-01 DIAGNOSIS — I10 Essential (primary) hypertension: Secondary | ICD-10-CM

## 2017-06-01 DIAGNOSIS — I428 Other cardiomyopathies: Secondary | ICD-10-CM

## 2017-06-01 DIAGNOSIS — Z95810 Presence of automatic (implantable) cardiac defibrillator: Secondary | ICD-10-CM | POA: Diagnosis not present

## 2017-06-01 NOTE — Patient Instructions (Signed)
Medication Instructions:    Your physician recommends that you continue on your current medications as directed. Please refer to the Current Medication list given to you today.    If you need a refill on your cardiac medications before your next appointment, please call your pharmacy.  Labwork: NONE ORDERED  TODAY    Testing/Procedures: NONE ORDERED  TODAY    Follow-Up: Remote monitoring is used to monitor your Pacemaker of ICD from home. This monitoring reduces the number of office visits required to check your device to one time per year. It allows [place] to keep an eye on the functioning of your device to ensure it is working properly. You are scheduled for a device check from home on . 10-24-2018You may send your transmission at any time that day. If you have a wireless device, the transmission will be sent automatically. After your physician reviews your transmission, you will receive a postcard with your next transmission date.  Your physician wants you to follow-up in: [place] will receive a reminder letter in the mail two months in advance. If you don't receive a letter, please call our office to schedule the follow-up appointment.     Any Other Special Instructions Will Be Listed Below (If Applicable).

## 2017-06-04 ENCOUNTER — Other Ambulatory Visit: Payer: Self-pay | Admitting: Cardiovascular Disease

## 2017-06-10 DIAGNOSIS — M1711 Unilateral primary osteoarthritis, right knee: Secondary | ICD-10-CM | POA: Diagnosis not present

## 2017-06-10 DIAGNOSIS — M25461 Effusion, right knee: Secondary | ICD-10-CM | POA: Diagnosis not present

## 2017-06-10 DIAGNOSIS — E119 Type 2 diabetes mellitus without complications: Secondary | ICD-10-CM | POA: Diagnosis not present

## 2017-06-10 DIAGNOSIS — M25561 Pain in right knee: Secondary | ICD-10-CM | POA: Diagnosis not present

## 2017-06-16 ENCOUNTER — Telehealth: Payer: Self-pay | Admitting: Cardiology

## 2017-06-16 NOTE — Telephone Encounter (Signed)
Spoke with pt and reminded pt of remote transmission that is due today. Pt verbalized understanding and stated that he is out of town until Wednesday 06-22-17. He will send on Thursday 06-23-17.

## 2017-06-20 NOTE — Progress Notes (Signed)
No ICM remote transmission received for 06/15/2017 and next ICM transmission scheduled for 06/30/2017.

## 2017-06-24 ENCOUNTER — Other Ambulatory Visit: Payer: Self-pay | Admitting: Family Medicine

## 2017-06-24 DIAGNOSIS — E119 Type 2 diabetes mellitus without complications: Secondary | ICD-10-CM | POA: Diagnosis not present

## 2017-06-24 DIAGNOSIS — M25461 Effusion, right knee: Secondary | ICD-10-CM | POA: Diagnosis not present

## 2017-06-24 DIAGNOSIS — M25561 Pain in right knee: Secondary | ICD-10-CM | POA: Diagnosis not present

## 2017-06-27 ENCOUNTER — Telehealth: Payer: Self-pay | Admitting: Family Medicine

## 2017-06-27 NOTE — Telephone Encounter (Signed)
Set up follow up here so we can discuss.

## 2017-06-27 NOTE — Telephone Encounter (Signed)
Patient came in requesting a referral to an orthopedic surgeon. He went to a [facility] clinic over the weekend and they took [place] and suggested that the patient sees his primary care provider. I advised the patient that he may need to make an appointment to have a referral to the orthopedics. Please advise patient

## 2017-06-27 NOTE — Telephone Encounter (Signed)
I left a message for the pt to return my call. 

## 2017-06-28 NOTE — Telephone Encounter (Signed)
I called the pt and informed him of the message below and he stated he has an appt with [place] on 8/29 and will call back for an appt here if needed.

## 2017-06-30 ENCOUNTER — Ambulatory Visit (INDEPENDENT_AMBULATORY_CARE_PROVIDER_SITE_OTHER): Payer: Medicare HMO

## 2017-06-30 ENCOUNTER — Telehealth: Payer: Self-pay

## 2017-06-30 DIAGNOSIS — Z95810 Presence of automatic (implantable) cardiac defibrillator: Secondary | ICD-10-CM

## 2017-06-30 DIAGNOSIS — I5022 Chronic systolic (congestive) heart failure: Secondary | ICD-10-CM

## 2017-06-30 NOTE — Telephone Encounter (Signed)
Remote ICM transmission received.  Attempted patient call and left detailed message regarding transmission and next ICM scheduled for 08/15/2017.  Advised to return call for any fluid symptoms or questions.

## 2017-06-30 NOTE — Progress Notes (Signed)
EPIC Encounter for ICM Monitoring  Patient Name: Kevin [name] is a 71 y.o. male Date: 06/30/2017 Primary Care Physican: [name], MD Primary Cardiologist:[name] Electrophysiologist: [name] Dry Weight:Last ICM weight 180lbs Bi-V Pacing: 99.3%      Attempted call to patient and unable to reach.  Left detailed message regarding transmission.  Transmission reviewed. .   Thoracic impedance normal but was abnormal suggesting fluid accumulation from 06/02/2017 to 06/26/2017.  Prescribed dosage: Furosemide 40 mg 1 tablet daily  Labs: 02/02/2017 Creatinine 1.34, BUN 20, Potassium 4.1, Sodium 137, EGFR 55.82 12/07/2016 Creatinine 1.13, BUN 13, Potassium 4.6, Sodium 135 09/10/2016 Creatinine 1.07, BUN 25, Potassium 4.4, Sodium 135 09/02/2016 Creatinine 1.15, BUN 23, Potassium 4.3, Sodium 136  06/08/2016 Creatinine 1.14, BUN 18, Potassium 4.1, Sodium 134 04/16/2016 Creatinine 0.96, BUN 12, Potassium 4.4, Sodium 137 08/22/2015 Creatinine 0.98, BUN 16, Potassium 4.4, Sodium 138  Recommendations: Left voice mail with ICM number and encouraged to call for fluid symptoms.  Follow-up plan: ICM clinic phone appointment on 08/15/2017.    Copy of ICM check sent to Dr. Rayann Heman.   3 month ICM trend: 06/30/2017   1 Year ICM trend:      Rosalene Billings, RN 06/30/2017 11:27 AM

## 2017-07-07 DIAGNOSIS — M25561 Pain in right knee: Secondary | ICD-10-CM | POA: Diagnosis not present

## 2017-07-07 DIAGNOSIS — M1711 Unilateral primary osteoarthritis, right knee: Secondary | ICD-10-CM | POA: Diagnosis not present

## 2017-07-28 ENCOUNTER — Encounter: Payer: Self-pay | Admitting: Family Medicine

## 2017-08-15 ENCOUNTER — Ambulatory Visit (INDEPENDENT_AMBULATORY_CARE_PROVIDER_SITE_OTHER): Payer: Medicare HMO | Admitting: *Deleted

## 2017-08-15 DIAGNOSIS — Z95810 Presence of automatic (implantable) cardiac defibrillator: Secondary | ICD-10-CM | POA: Diagnosis not present

## 2017-08-15 DIAGNOSIS — I5022 Chronic systolic (congestive) heart failure: Secondary | ICD-10-CM

## 2017-08-15 DIAGNOSIS — I428 Other cardiomyopathies: Secondary | ICD-10-CM

## 2017-08-15 NOTE — Progress Notes (Signed)
EPIC Encounter for ICM Monitoring  Patient Name: Kevin [name] is a 71 y.o. male Date: 08/15/2017 Primary Care Physican: [name], MD Primary Cardiologist:[name] Electrophysiologist: [name] Dry Weight:179lbs Bi-V Pacing: 99.3%       Heart Failure questions reviewed, pt asymptomatic.   Thoracic impedance normal.  Prescribed dosage: Furosemide 40 mg 1 tablet daily  Labs: 02/02/2017 Creatinine 1.34, BUN 20, Potassium 4.1, Sodium 137, EGFR 55.82 12/07/2016 Creatinine 1.13, BUN 13, Potassium 4.6, Sodium 135 09/10/2016 Creatinine 1.07, BUN 25, Potassium 4.4, Sodium 135 09/02/2016 Creatinine 1.15, BUN 23, Potassium 4.3, Sodium 136  06/08/2016 Creatinine 1.14, BUN 18, Potassium 4.1, Sodium 134 04/16/2016 Creatinine 0.96, BUN 12, Potassium 4.4, Sodium 137 10/14/2016Creatinine 0.98, BUN 16, Potassium 4.4, Sodium 138  Recommendations: No changes. Discussed using no salt alternative and advised that any salt free spices have a lot of potassium and should be used cautiously or not all all depending on patient's condition.  Encouraged him to use more herbs and flavored vinegars and oils for flavor which don't contain potassium or salt.  Encouraged to call for fluid symptoms.  Follow-up plan: ICM clinic phone appointment on 09/15/2017.  Advised he is due to make appointment with Dr Sallyanne Kuster (last visit was 11/18/2014).    Copy of ICM check sent to Dr. Rayann Heman.   3 month ICM trend: 08/15/2017   1 Year ICM trend:      Rosalene Billings, RN 08/15/2017 9:53 AM

## 2017-08-16 LAB — CUP PACEART REMOTE DEVICE CHECK
Battery Remaining Longevity: 23 mo
Battery Voltage: 2.94 V
Brady Statistic AP VS Percent: 0.01 %
Brady Statistic AS VP Percent: 99.36 %
HighPow Impedance: 78 Ohm
Implantable Lead Implant Date: 20130123
Implantable Lead Implant Date: 20160316
Implantable Lead Location: 753858
Implantable Lead Location: 753859
Implantable Lead Model: 5076
Implantable Lead Model: 6935
Implantable Pulse Generator Implant Date: 20160316
Lead Channel Impedance Value: 1140 Ohm
Lead Channel Impedance Value: 361 Ohm
Lead Channel Impedance Value: 475 Ohm
Lead Channel Impedance Value: 494 Ohm
Lead Channel Impedance Value: 589 Ohm
Lead Channel Impedance Value: 589 Ohm
Lead Channel Impedance Value: 760 Ohm
Lead Channel Impedance Value: 931 Ohm
Lead Channel Pacing Threshold Amplitude: 0.5 V
Lead Channel Sensing Intrinsic Amplitude: 12 mV
Lead Channel Sensing Intrinsic Amplitude: 12 mV
Lead Channel Sensing Intrinsic Amplitude: 3.625 mV
Lead Channel Sensing Intrinsic Amplitude: 3.625 mV
Lead Channel Setting Pacing Amplitude: 2.5 V
Lead Channel Setting Pacing Amplitude: 3.25 V
Lead Channel Setting Pacing Pulse Width: 0.4 ms
Lead Channel Setting Pacing Pulse Width: 1 ms
MDC IDC LEAD IMPLANT DT: 20160316
MDC IDC LEAD LOCATION: 753860
MDC IDC MSMT LEADCHNL LV IMPEDANCE VALUE: 1121 Ohm
MDC IDC MSMT LEADCHNL LV IMPEDANCE VALUE: 1178 Ohm
MDC IDC MSMT LEADCHNL LV IMPEDANCE VALUE: 570 Ohm
MDC IDC MSMT LEADCHNL LV IMPEDANCE VALUE: 931 Ohm
MDC IDC MSMT LEADCHNL LV PACING THRESHOLD AMPLITUDE: 2.75 V
MDC IDC MSMT LEADCHNL LV PACING THRESHOLD PULSEWIDTH: 1 ms
MDC IDC MSMT LEADCHNL RA PACING THRESHOLD AMPLITUDE: 0.625 V
MDC IDC MSMT LEADCHNL RA PACING THRESHOLD PULSEWIDTH: 0.4 ms
MDC IDC MSMT LEADCHNL RV IMPEDANCE VALUE: 418 Ohm
MDC IDC MSMT LEADCHNL RV PACING THRESHOLD PULSEWIDTH: 0.4 ms
MDC IDC SESS DTM: 20181008084224
MDC IDC SET LEADCHNL RA PACING AMPLITUDE: 2 V
MDC IDC SET LEADCHNL RV SENSING SENSITIVITY: 0.3 mV
MDC IDC STAT BRADY AP VP PERCENT: 0.07 %
MDC IDC STAT BRADY AS VS PERCENT: 0.56 %
MDC IDC STAT BRADY RA PERCENT PACED: 0.08 %
MDC IDC STAT BRADY RV PERCENT PACED: 99.32 %

## 2017-08-16 NOTE — Progress Notes (Signed)
Remote ICD transmission.   

## 2017-08-18 ENCOUNTER — Encounter: Payer: Self-pay | Admitting: Cardiology

## 2017-08-25 DIAGNOSIS — M1711 Unilateral primary osteoarthritis, right knee: Secondary | ICD-10-CM | POA: Diagnosis not present

## 2017-08-25 DIAGNOSIS — M25561 Pain in right knee: Secondary | ICD-10-CM | POA: Diagnosis not present

## 2017-09-01 DIAGNOSIS — M1711 Unilateral primary osteoarthritis, right knee: Secondary | ICD-10-CM | POA: Diagnosis not present

## 2017-09-01 DIAGNOSIS — M25561 Pain in right knee: Secondary | ICD-10-CM | POA: Diagnosis not present

## 2017-09-02 ENCOUNTER — Ambulatory Visit (INDEPENDENT_AMBULATORY_CARE_PROVIDER_SITE_OTHER): Payer: Medicare HMO | Admitting: Family Medicine

## 2017-09-02 ENCOUNTER — Encounter: Payer: Self-pay | Admitting: Cardiology

## 2017-09-02 ENCOUNTER — Encounter: Payer: Self-pay | Admitting: Family Medicine

## 2017-09-02 VITALS — BP 112/70 | HR 79 | Temp 97.8°F | Ht 73.0 in | Wt 188.1 lb

## 2017-09-02 DIAGNOSIS — G3184 Mild cognitive impairment, so stated: Secondary | ICD-10-CM | POA: Diagnosis not present

## 2017-09-02 DIAGNOSIS — Z1211 Encounter for screening for malignant neoplasm of colon: Secondary | ICD-10-CM | POA: Diagnosis not present

## 2017-09-02 DIAGNOSIS — Z1212 Encounter for screening for malignant neoplasm of rectum: Secondary | ICD-10-CM | POA: Diagnosis not present

## 2017-09-02 NOTE — Progress Notes (Signed)
Subjective:     Patient ID: Kevin [name], male   DOB: [birth year], 72 y.o.   MRN: [id number]  HPI Patient here accompanied by wife with concerns about potential memory issues. His wife is more concerned than he is. She has concerns about some short-term memory issues. 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 He retired from work few years ago. Family history of bipolar in father. No known family history of dementia.  His chronic problems include history of chronic systolic dysfunction, hypertension, type 2 diabetes, history of prostate cancer, hyperlipidemia. He had history of Agent Orange exposure. He is followed closely by [facility] clinic. He has not had any recent thyroid screening or B12 screening. Is on metformin  Denies any recent headaches. No focal neurologic concerns. No head injury.  Past Medical History:  Diagnosis Date  . Agent orange exposure    in [place]  . Anemia   . Heart block AV second degree    a. s/p PPM implant with subsequent CRTD upgrade  . High cholesterol   . History of colon polyps   . Hypertension   . LBBB (left bundle branch block)   . Nonischemic cardiomyopathy ([place])    a. MDT CRTD upgrade 2016  . Prostate cancer ([place])    "not been treated yet; on a wait and see" (01/22/2015)  . Type II diabetes mellitus ([place])    Past Surgical History:  Procedure Laterality Date  . BASAL CELL CARCINOMA EXCISION     "back, face, neck"  . BI-VENTRICULAR IMPLANTABLE CARDIOVERTER DEFIBRILLATOR UPGRADE N/A 01/22/2015   MDT CRTD upgrade by Dr Rayann Heman  . CARDIAC CATHETERIZATION  01/27/2012   normal coronaries, dilated LV c/w nonischemic cardiomyopathy  . EXAMINATION UNDER ANESTHESIA  04/06/2012   Procedure: EXAM UNDER ANESTHESIA;  Surgeon: Marcello Moores A. Cornett, MD;  Location: [place];  Service: General;  Laterality: N/A;  . INCISION AND DRAINAGE PERIRECTAL ABSCESS  ~ 2010; 2015  . INGUINAL HERNIA REPAIR Left 1980  . LEFT HEART CATHETERIZATION  WITH CORONARY ANGIOGRAM N/A 01/27/2012   Procedure: LEFT HEART CATHETERIZATION WITH CORONARY ANGIOGRAM;  Surgeon: Troy Sine, MD;  Location: [facility] CATH LAB;  Service: Cardiovascular;  Laterality: N/A;  . NM MYOCAR PERF WALL MOTION  01/21/2012   mod-severe perfusion defect due to infarct/scar w/mild perinfarct ischemia in the apical,basal inferoseptal,basal inferior,mid inferoseptal,midinferior and apical inferior regions  . PERMANENT PACEMAKER INSERTION N/A 12/01/2011   Medtronic implanted by Dr Sallyanne Kuster  . PROSTATE BIOPSY    . PROSTATE BIOPSY    . shrapnel surgery     "in [place]"  . TONSILLECTOMY      reports that he quit smoking about 33 years ago. His smoking use included Cigarettes. He has a 20.00 pack-year smoking history. He has never used smokeless tobacco. He reports that he drinks alcohol. He reports that he does not use drugs. family history includes Heart disease in his father; Leukemia in his father; Ovarian cancer in his mother. Allergies  Allergen Reactions  . Sulfa Antibiotics Swelling    Swelling of lips only.     Review of Systems  Constitutional: Negative for appetite change, chills and fever.  Respiratory: Negative for shortness of breath.   Cardiovascular: Negative for chest pain.  Neurological: Negative for dizziness, tremors, seizures, syncope, speech difficulty, weakness and headaches.  Hematological: Negative for adenopathy.  Psychiatric/Behavioral: Negative for confusion.       Objective:   Physical Exam  Constitutional: He is oriented to person,  place, and time. He appears well-developed and well-nourished.  Neck: Neck supple. No thyromegaly present.  Cardiovascular: Normal rate and regular rhythm.   Pulmonary/Chest: Effort normal and breath sounds normal. No respiratory distress. He has no wheezes. He has no rales.  Musculoskeletal: He exhibits no edema.  Neurological: He is alert and oriented to person, place, and time. No cranial nerve deficit.  Coordination normal.  Psychiatric:  MMSE 27/30       Assessment:     Patient presents with concerns for possible cognitive impairment. Mini-Mental Status exam is 27/30.  1 minute animal naming was 12    Plan:     -Check TSH and B12 -Consider follow-up in 6 months and repeat Mini-Mental status exam then  [name] MD [facility]

## 2017-09-03 LAB — VITAMIN B12: Vitamin B-12: 522 pg/mL (ref 200–1100)

## 2017-09-03 LAB — TSH: TSH: 0.56 mIU/L (ref 0.40–4.50)

## 2017-09-08 DIAGNOSIS — M25561 Pain in right knee: Secondary | ICD-10-CM | POA: Diagnosis not present

## 2017-09-08 DIAGNOSIS — M1711 Unilateral primary osteoarthritis, right knee: Secondary | ICD-10-CM | POA: Diagnosis not present

## 2017-09-15 ENCOUNTER — Ambulatory Visit (INDEPENDENT_AMBULATORY_CARE_PROVIDER_SITE_OTHER): Payer: Medicare HMO

## 2017-09-15 DIAGNOSIS — Z95810 Presence of automatic (implantable) cardiac defibrillator: Secondary | ICD-10-CM

## 2017-09-15 DIAGNOSIS — I5022 Chronic systolic (congestive) heart failure: Secondary | ICD-10-CM | POA: Diagnosis not present

## 2017-09-15 NOTE — Progress Notes (Signed)
EPIC Encounter for ICM Monitoring  Patient Name: Kevin [name] is a 71 y.o. male Date: 09/15/2017 Primary Care Physican: [name], MD Primary Cardiologist:[name] Electrophysiologist: [name] Dry Weight:188lbs at office  Bi-V Pacing: 98.8%      Spoke with patient and wife. Heart Failure questions reviewed, pt asymptomatic.   Optivol: Thoracic impedance abnormal suggesting fluid accumulation since 09/08/2017.   Prescribed dosage: Furosemide 40 mg 1 tablet daily  Labs: 02/02/2017 Creatinine 1.34, BUN 20, Potassium 4.1, Sodium 137, EGFR 55.82 12/07/2016 Creatinine 1.13, BUN 13, Potassium 4.6, Sodium 135 09/10/2016 Creatinine 1.07, BUN 25, Potassium 4.4, Sodium 135 09/02/2016 Creatinine 1.15, BUN 23, Potassium 4.3, Sodium 136  06/08/2016 Creatinine 1.14, BUN 18, Potassium 4.1, Sodium 134 04/16/2016 Creatinine 0.96, BUN 12, Potassium 4.4, Sodium 137 08/22/2015 Creatinine 0.98, BUN 16, Potassium 4.4, Sodium 138  Recommendations:  Wife physician has said patient can take extra Furosemide when needed and she will have him take 1 tablet twice a day x 2 days.   Follow-up plan: ICM clinic phone appointment on 09/22/2017 to recheck fluid levels.   Discussed he is overdue to see Dr Sallyanne Kuster and will send a message to have office call him for an appointment.    Copy of ICM check sent to Dr. Sallyanne Kuster and Dr. Rayann Heman for review..   3 month ICM trend: 09/15/2017    1 Year ICM trend:       Rosalene Billings, RN 09/15/2017 8:47 AM

## 2017-09-22 ENCOUNTER — Ambulatory Visit (INDEPENDENT_AMBULATORY_CARE_PROVIDER_SITE_OTHER): Payer: Medicare HMO

## 2017-09-22 ENCOUNTER — Telehealth: Payer: Self-pay

## 2017-09-22 DIAGNOSIS — I5022 Chronic systolic (congestive) heart failure: Secondary | ICD-10-CM

## 2017-09-22 DIAGNOSIS — Z95810 Presence of automatic (implantable) cardiac defibrillator: Secondary | ICD-10-CM | POA: Diagnosis not present

## 2017-09-22 NOTE — Telephone Encounter (Signed)
Remote ICM transmission received.  Attempted call to patient and left detailed message per DPR regarding transmission and next ICM scheduled for 10/20/2017.  Advised to return call for any fluid symptoms or questions.

## 2017-09-22 NOTE — Progress Notes (Signed)
EPIC Encounter for ICM Monitoring  Patient Name: Kevin [name] is a 70 y.o. male Date: 09/22/2017 Primary Care Physican: [name], MD Primary Cardiologist:[name] Electrophysiologist: [name] Dry Weight:Previous weight 188lbs at office  Bi-V Pacing: 98.6%       Attempted call to patient and unable to reach.  Left detailed message regarding transmission.  Transmission reviewed.    Thoracic impedance returned to normal.  Prescribed dosage: Furosemide 40 mg 1 tablet daily  Labs: 02/02/2017 Creatinine 1.34, BUN 20, Potassium 4.1, Sodium 137, EGFR 55.82 12/07/2016 Creatinine 1.13, BUN 13, Potassium 4.6, Sodium 135 09/10/2016 Creatinine 1.07, BUN 25, Potassium 4.4, Sodium 135 09/02/2016 Creatinine 1.15, BUN 23, Potassium 4.3, Sodium 136  06/08/2016 Creatinine 1.14, BUN 18, Potassium 4.1, Sodium 134 04/16/2016 Creatinine 0.96, BUN 12, Potassium 4.4, Sodium 137 08/22/2015 Creatinine 0.98, BUN 16, Potassium 4.4, Sodium 138  Recommendations: Left voice mail with ICM number and encouraged to call if experiencing any fluid symptoms.  Follow-up plan: ICM clinic phone appointment on 10/20/2017.  Left number for Dr [name]'s office and advised to call to make an appointment.   Copy of ICM check sent to Dr. Rayann Heman.   3 month ICM trend: 09/22/2017    1 Year ICM trend:       Rosalene Billings, RN 09/22/2017 1:59 PM

## 2017-09-28 ENCOUNTER — Encounter: Payer: Self-pay | Admitting: Cardiovascular Disease

## 2017-09-28 ENCOUNTER — Ambulatory Visit: Payer: Medicare HMO | Admitting: Cardiovascular Disease

## 2017-09-28 VITALS — BP 110/74 | HR 79 | Ht 75.0 in | Wt 180.8 lb

## 2017-09-28 DIAGNOSIS — I5042 Chronic combined systolic (congestive) and diastolic (congestive) heart failure: Secondary | ICD-10-CM | POA: Diagnosis not present

## 2017-09-28 DIAGNOSIS — E7800 Pure hypercholesterolemia, unspecified: Secondary | ICD-10-CM

## 2017-09-28 DIAGNOSIS — E119 Type 2 diabetes mellitus without complications: Secondary | ICD-10-CM

## 2017-09-28 DIAGNOSIS — C61 Malignant neoplasm of prostate: Secondary | ICD-10-CM

## 2017-09-28 DIAGNOSIS — Z95810 Presence of automatic (implantable) cardiac defibrillator: Secondary | ICD-10-CM

## 2017-09-28 NOTE — Progress Notes (Signed)
Cardiology Office Note:    Date:  09/28/2017   ID:  REGINA COPPOLINO, DOB [birth year], MRN [id number]  PCP:  [name], MD  Cardiologist:  Sanda Klein, MD ; [name], MD  Referring MD: [name], MD   Chief Complaint  Patient presents with  . Dizziness  . Palpitations    with anxiety    History of Present Illness:    Kevin [name] is a 71 y.o. male with a hx of nonischemic cardiomyopathy, left bundle branch block, second-degree AV block, type 2 diabetes mellitus, hypertension, hyperlipidemia, prostate cancer who underwent upgrade of a dual-chamber permanent pacemaker to a CRT-D device in 2016 for worsening left ventricular function/heart failure.  He has done very well from a cardiovascular point of view over the last year.  He has occasional orthostatic dizziness, but otherwise has no cardiac complaints.  He works as a Psychologist, occupational at [employer] and pushes patients around in a wheelchair walking 4-5 miles a day, without dyspnea.  At home his weight is very consistently in the 179-180 pounds.  Our office scale seems to overestimate this by about a pound.  He takes a low dose of loop diuretic.  The patient specifically denies any chest pain at rest exertion, dyspnea at rest or with exertion, orthopnea, paroxysmal nocturnal dyspnea, syncope, palpitations, focal neurological deficits, intermittent claudication, lower extremity edema, unexplained weight gain, cough, hemoptysis or wheezing.  His current device is a Architectural technologist.  Battery still close to beginning of life.  He has greater than 99% biventricular pacing and less than 1% atrial pacing.  Lead parameters are good.  Thoracic impedance shows stable optivol.  He reports good glycemic control.  He sees a urologist at the [facility] and has a slow growing prostate cancer.  Recently his PSA has been increasing slowly.  He was told that he has "6-7 years" with this cancer.  He was also  told by his urologist that the fact that he has a defibrillator would preclude the use of radiation therapy, with which I disagree.  I see no reason why he could not receive either external beam radiotherapy or brachytherapy.  Past Medical History:  Diagnosis Date  . Agent orange exposure    in [place]  . Anemia   . Heart block AV second degree    a. s/p PPM implant with subsequent CRTD upgrade  . High cholesterol   . History of colon polyps   . Hypertension   . LBBB (left bundle branch block)   . Nonischemic cardiomyopathy ([place])    a. MDT CRTD upgrade 2016  . Pacemaker 08/27/2013   Dual-chamber Medtronic Adapta implanted January 2013 for bradycardia with alternating bundle branch block and 2:1 atrioventricular block   . Prostate cancer ([place])    "not been treated yet; on a wait and see" (01/22/2015)  . Type II diabetes mellitus ([place])     Past Surgical History:  Procedure Laterality Date  . BASAL CELL CARCINOMA EXCISION     "back, face, neck"  . BI-VENTRICULAR IMPLANTABLE CARDIOVERTER DEFIBRILLATOR UPGRADE N/A 01/22/2015   MDT CRTD upgrade by Dr Rayann Heman  . CARDIAC CATHETERIZATION  01/27/2012   normal coronaries, dilated LV c/w nonischemic cardiomyopathy  . EXAMINATION UNDER ANESTHESIA  04/06/2012   Procedure: EXAM UNDER ANESTHESIA;  Surgeon: Marcello Moores A. Cornett, MD;  Location: [place];  Service: General;  Laterality: N/A;  . INCISION AND DRAINAGE PERIRECTAL ABSCESS  ~ 2010; 2015  . INGUINAL HERNIA REPAIR  Left 1980  . LEFT HEART CATHETERIZATION WITH CORONARY ANGIOGRAM N/A 01/27/2012   Procedure: LEFT HEART CATHETERIZATION WITH CORONARY ANGIOGRAM;  Surgeon: Troy Sine, MD;  Location: [facility] C[facility] LAB;  Service: Cardiovascular;  Laterality: N/A;  . NM MYOCAR PERF WALL MOTION  01/21/2012   mod-severe perfusion defect due to infarct/scar w/mild perinfarct ischemia in the apical,basal inferoseptal,basal inferior,mid inferoseptal,midinferior and apical inferior regions  . PERMANENT PACEMAKER  INSERTION N/A 12/01/2011   Medtronic implanted by Dr Sallyanne Kuster  . PROSTATE BIOPSY    . PROSTATE BIOPSY    . shrapnel surgery     "in [place]"  . TONSILLECTOMY      Current Medications: Current Meds  Medication Sig  . ACCU-CHEK COMPACT PLUS test strip use 1 TEST STRIP once daily as directed  . aspirin 81 MG tablet Take 81 mg by mouth daily.    Marland Kitchen atorvastatin (LIPITOR) 80 MG tablet Take 40 mg by mouth daily at 6 PM.  . buPROPion (WELLBUTRIN XL) 150 MG 24 hr tablet TAKE 1 TABLET BY MOUTH EVERY DAY  . carvedilol (COREG) 12.5 MG tablet Take 6.25 mg by mouth 2 (two) times daily with a meal.  . clobetasol ointment (TEMOVATE) 0.86 % Apply 1 application topically daily as needed (itching).   . furosemide (LASIX) 40 MG tablet TAKE 1 TABLET BY MOUTH DAILY WITH BREAKFAST  . latanoprost (XALATAN) 0.005 % ophthalmic solution Place 1 drop into the right eye daily.   Marland Kitchen LORazepam (ATIVAN) 0.5 MG tablet Take 0.5 mg by mouth every 8 (eight) hours. As needed  . SitaGLIPtin-MetFORMIN HCl (JANUMET XR) 50-1000 MG TB24 Take 0.5 tablets by mouth 2 (two) times daily.  Marland Kitchen spironolactone (ALDACTONE) 25 MG tablet Take 25 mg by mouth daily.  Marland Kitchen VIAGRA 100 MG tablet TAKE ONE TABLET BY MOUTH AS NEEDED FOR ERECTILE DYSFUNCTION.  . [DISCONTINUED] JANUMET XR 50-1000 MG TB24 TAKE 2 TABLETS BY MOUTH EVERY DAY (Patient taking differently: take 1/2 tablet by mouth twice daily)     Allergies:   Sulfa antibiotics   Social History   Socioeconomic History  . Marital status: Married    Spouse name: None  . Number of children: 0  . Years of education: None  . Highest education level: None  Social Needs  . Financial resource strain: None  . Food insecurity - worry: None  . Food insecurity - inability: None  . Transportation needs - medical: None  . Transportation needs - non-medical: None  Occupational History  . Occupation: Surveyor, minerals: [employer]  Tobacco Use  . Smoking status: Former Smoker     Packs/day: 1.00    Years: 20.00    Pack years: 20.00    Types: Cigarettes    Last attempt to quit: 04/13/1984    Years since quitting: 33.4  . Smokeless tobacco: Never Used  Substance and Sexual Activity  . Alcohol use: Yes    Comment: 01/22/2015 "maybe 3 beers or glasses of wine /month"  . Drug use: No  . Sexual activity: Yes  Other Topics Concern  . None  Social History Narrative   Pt lives in [place] with spouse.  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     Family History: The patient's family history includes Heart disease in his father; Leukemia in his father; Ovarian cancer in his mother. There is no history of Diabetes. ROS:   Please see the history of present illness.     All other  systems reviewed and are negative.  EKGs/Labs/Other Studies Reviewed:     EKG:  EKG is ordered today.  The ekg ordered today demonstrates showed atrial sensed (sinus), ventricular paced rhythm.  Although the QRS is very broad at 174 ms there are prominent positive R waves in leads V1 and V2 consistent with effective left ventricular pacing.  QTc 516 ms.  Recent Labs: 12/07/2016: ALT 7 02/02/2017: BUN 20; Creatinine, Ser 1.34; Potassium 4.1; Sodium 137 09/02/2017: TSH 0.56  Recent Lipid Panel    Component Value Date/Time   CHOL 104 12/07/2016 0929   TRIG 67.0 12/07/2016 0929   HDL 34.90 (L) 12/07/2016 0929   CHOLHDL 3 12/07/2016 0929   VLDL 13.4 12/07/2016 0929   LDLCALC 56 12/07/2016 0929   LDLDIRECT 58.6 09/24/2014 0824    Physical Exam:    VS:  BP 110/74   Pulse 79   Ht 6\' 3"  (1.905 m)   Wt 180 lb 12.8 oz (82 kg)   BMI 22.60 kg/m     Wt Readings from Last 3 Encounters:  09/28/17 180 lb 12.8 oz (82 kg)  09/02/17 188 lb 2 oz (85.3 kg)  06/01/17 183 lb (83 kg)     GEN: Lean and fit, well nourished, well developed in no acute distress HEENT: Normal NECK: No JVD; No carotid bruits LYMPHATICS: No lymphadenopathy CARDIAC: Healthy subclavian  defibrillator site, normal S1 and widely split S2, RRR, no murmurs, rubs, gallops RESPIRATORY:  Clear to auscultation without rales, wheezing or rhonchi  ABDOMEN: Soft, non-tender, non-distended MUSCULOSKELETAL:  No edema; No deformity  SKIN: Warm and dry NEUROLOGIC:  Alert and oriented x 3 PSYCHIATRIC:  Normal affect   ASSESSMENT:    1. Chronic combined systolic and diastolic heart failure ([place])   2. 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 in situ   3. Pure hypercholesterolemia   4. Type 2 diabetes mellitus without complication, without long-term current use of insulin ([place])   5. Malignant neoplasm of prostate ([place])    PLAN:    In order of problems listed above:  1. CHF: Well compensated, NYHA functional class I, on carvedilol and spironolactone.  Intolerant of ARB due to orthostatic hypotension,  for the same reason I do not think he would tolerate Entresto.  He has not had any problems with volume overload, on the contrary is probably sometimes hypovolemic.  Discussed the concept of "dry weight" and daily weight monitoring.  When he has dizzy spells he should check his blood pressure.  If his blood pressure is a low he should drink extra water and have a small salty snack. 2. CRT-D: Normal device function with a greater than 90% by V pacing.  No serious arrhythmia recorded.  Monitored every 3 months in the device clinic. 3. HLP: Excellent LDL cholesterol, borderline low HDL, but he is already very lean and exercises quite a bit. 4. DM: Well controlled, most recent A1c 6.6% 5. Prostate Ca: I think he would safely be able to undergo radiation therapy for prostate cancer if he should choose this route, despite the fact that he has a defibrillator.   Medication Adjustments/Labs and Tests Ordered: Current medicines are reviewed at length with the patient today.  Concerns regarding medicines are outlined above.  No orders of the defined types were placed in this  encounter.  No orders of the defined types were placed in this encounter.   Signed, Sanda Klein, MD  09/28/2017 1:23 PM    [facility]

## 2017-09-28 NOTE — Patient Instructions (Signed)
Dr [name] recommends that you schedule a follow-up appointment in 12 months. You will receive a reminder letter in the mail two months in advance. If you don't receive a letter, please call our office to schedule the follow-up appointment.  If you need a refill on your cardiac medications before your next appointment, please call your pharmacy. 

## 2017-10-17 ENCOUNTER — Other Ambulatory Visit: Payer: Self-pay | Admitting: Internal Medicine

## 2017-10-20 ENCOUNTER — Ambulatory Visit (INDEPENDENT_AMBULATORY_CARE_PROVIDER_SITE_OTHER): Payer: Medicare HMO

## 2017-10-20 DIAGNOSIS — I5042 Chronic combined systolic (congestive) and diastolic (congestive) heart failure: Secondary | ICD-10-CM | POA: Diagnosis not present

## 2017-10-20 DIAGNOSIS — Z95810 Presence of automatic (implantable) cardiac defibrillator: Secondary | ICD-10-CM

## 2017-10-25 NOTE — Progress Notes (Signed)
EPIC Encounter for ICM Monitoring  Patient Name: Kevin [name] is a 71 y.o. male Date: 10/25/2017 Primary Care Physican: [name], MD Primary Cardiologist:[name] Electrophysiologist: [name] Dry Weight:Previous weight 188lbsat office Bi-V Pacing: 98.8%      Transmission received.   Thoracic impedance normal.  Prescribed dosage: Furosemide 40 mg 1 tablet daily  Labs: 02/02/2017 Creatinine 1.34, BUN 20, Potassium 4.1, Sodium 137, EGFR 55.82 12/07/2016 Creatinine 1.13, BUN 13, Potassium 4.6, Sodium 135 09/10/2016 Creatinine 1.07, BUN 25, Potassium 4.4, Sodium 135 09/02/2016 Creatinine 1.15, BUN 23, Potassium 4.3, Sodium 136  06/08/2016 Creatinine 1.14, BUN 18, Potassium 4.1, Sodium 134 04/16/2016 Creatinine 0.96, BUN 12, Potassium 4.4, Sodium 137 08/22/2015 Creatinine 0.98, BUN 16, Potassium 4.4, Sodium 138  Recommendations: None.  Follow-up plan: ICM clinic phone appointment on 11/21/2017.    Copy of ICM check sent to Dr. Rayann Heman.   3 month ICM trend: 10/20/2017    1 Year ICM trend:       Rosalene Billings, RN 10/25/2017 8:02 AM

## 2017-11-02 IMAGING — CR DG KNEE COMPLETE 4+V*R*
4 series · 4 of 4 positions shown · non-contrast
Comparison: None available

CLINICAL DATA: Right knee twisting injury going down stairs.
Lateral pain.

EXAM:
RIGHT KNEE - COMPLETE 4+ VIEW

[x knee ap right (1 of 3)]
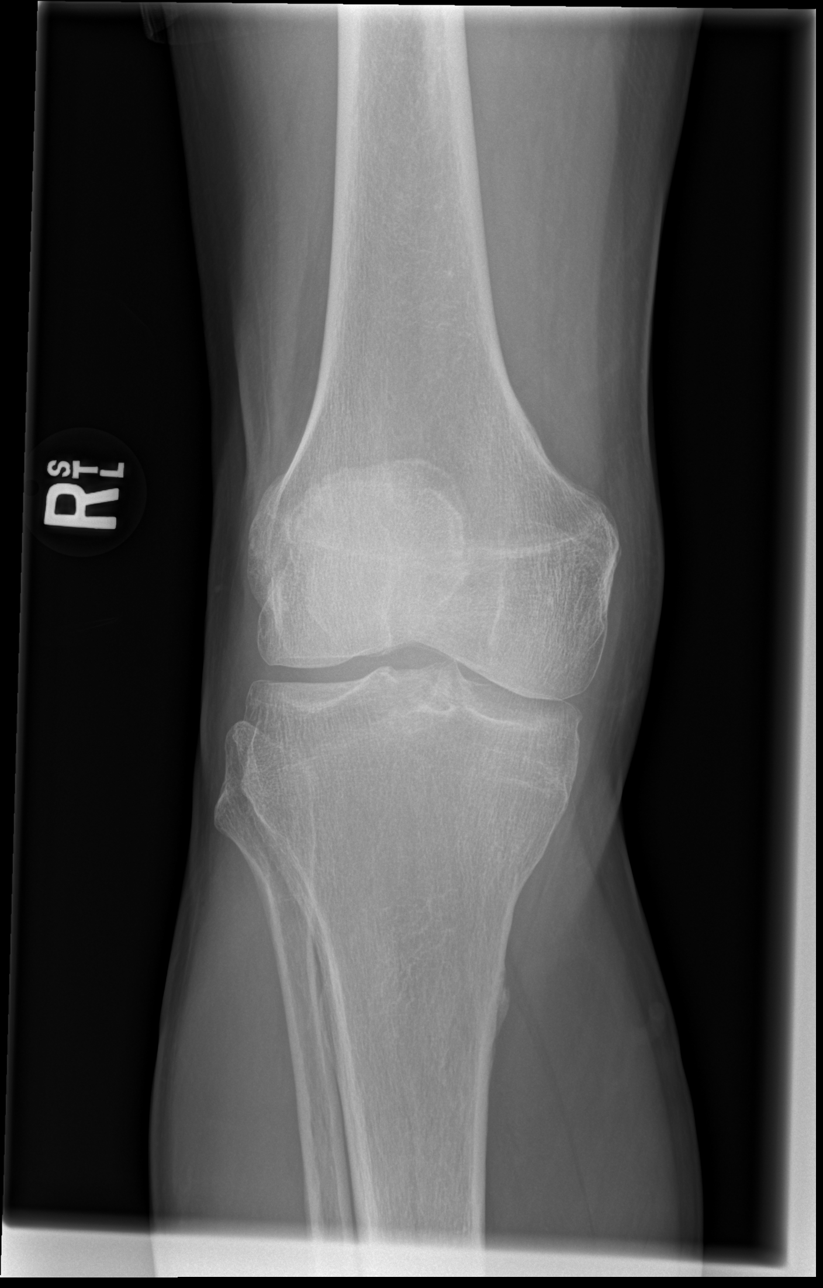

[x knee ap right (2 of 3)]
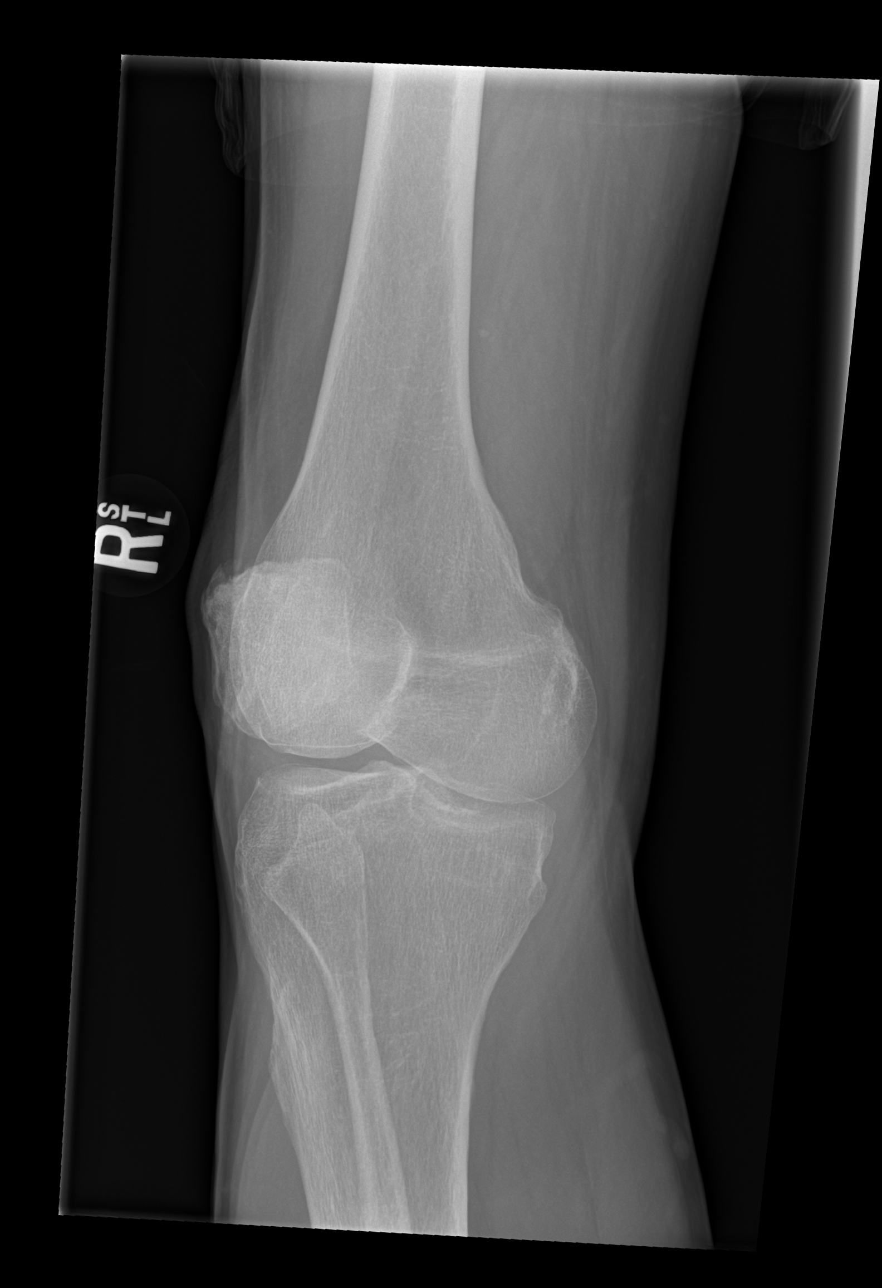

[x knee ap right (3 of 3)]
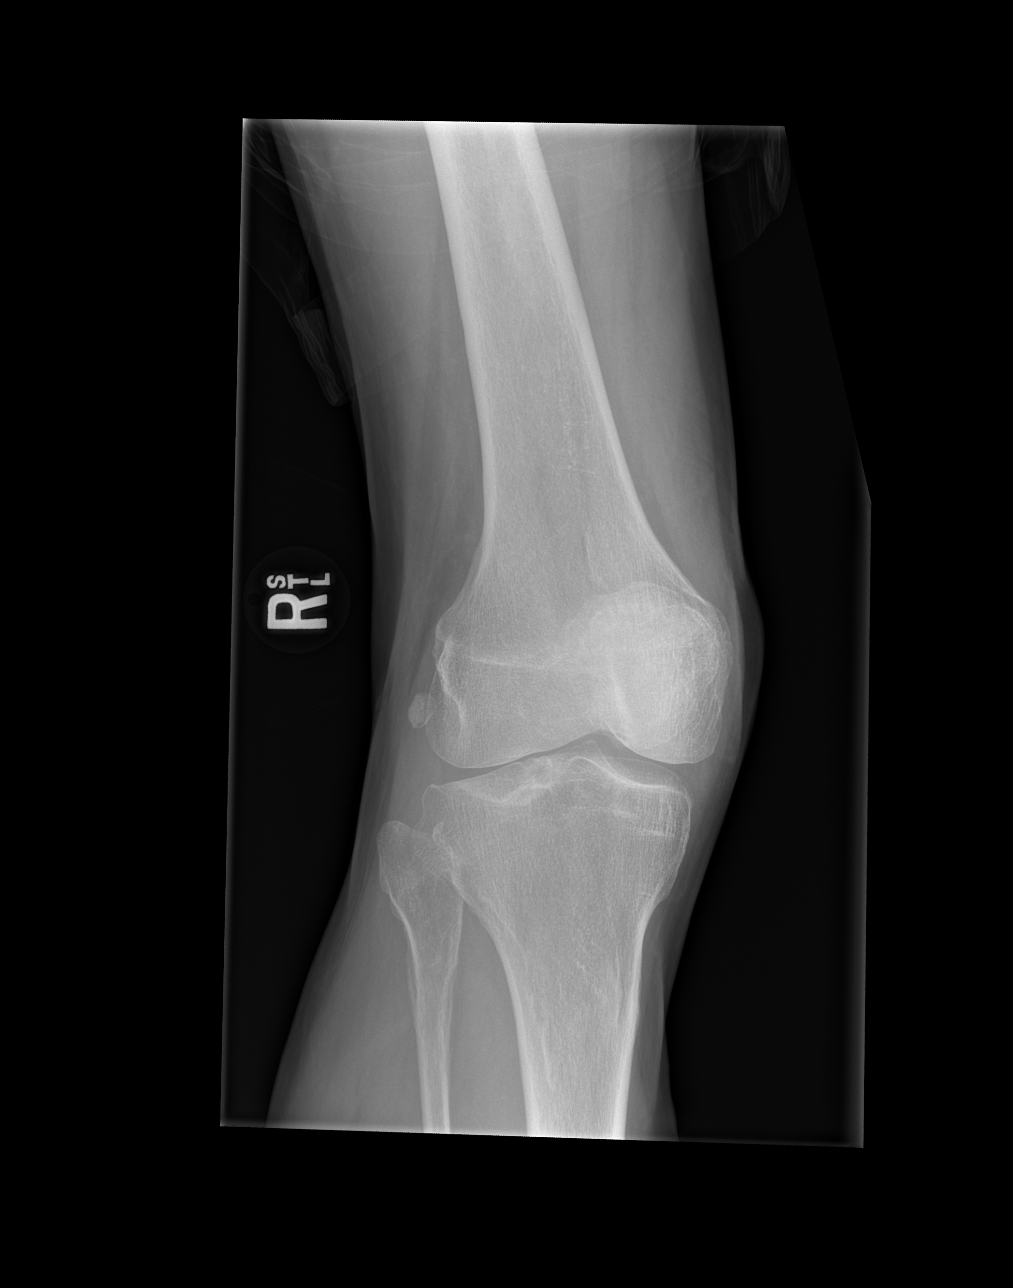

[x knee lat right]
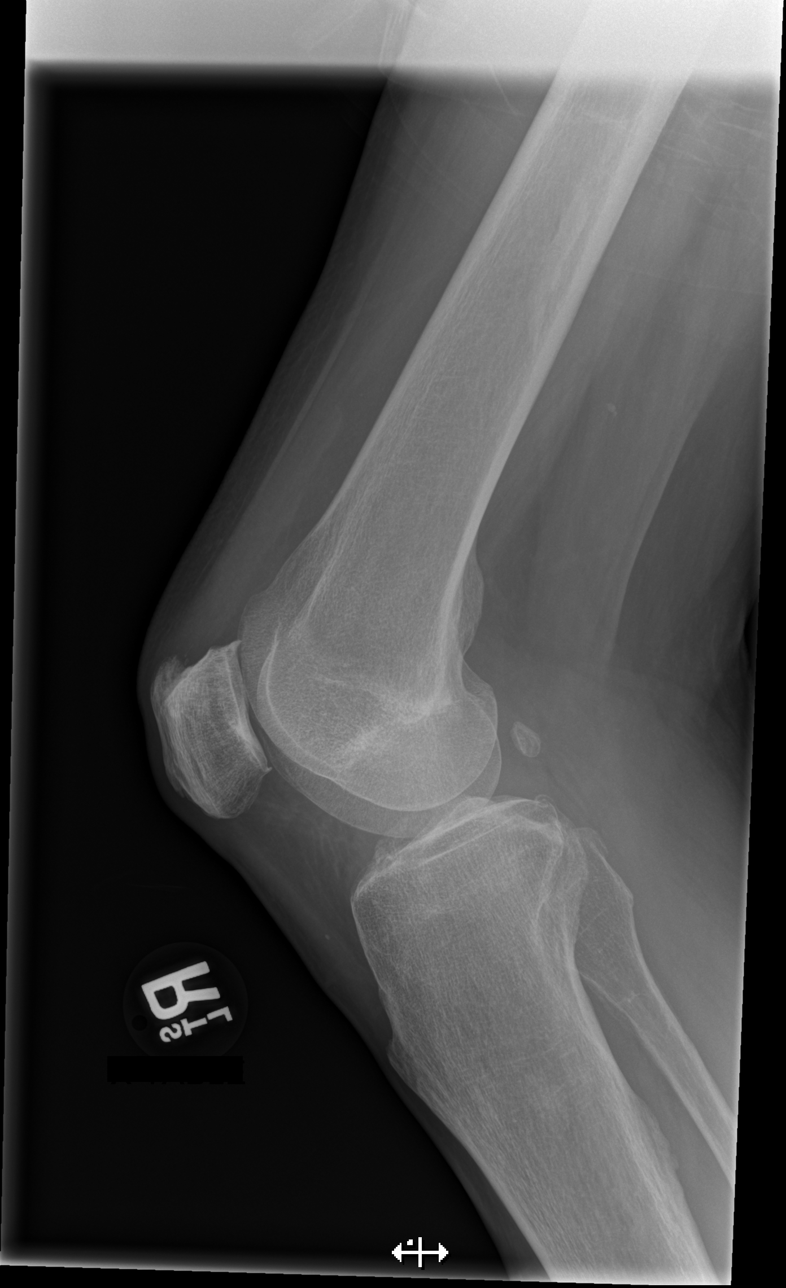

[4 of 4 positions shown; findings below may reference images not displayed]

FINDINGS: Minor tricompartmental degenerative osteoarthritis with joint space
loss, bony spurring and minor sclerosis. No acute osseous finding,
fracture or effusion. No definite soft tissue abnormality.
IMPRESSION: Minor right knee tricompartmental osteoarthritis.

No acute osseous finding.

## 2017-11-16 ENCOUNTER — Encounter: Payer: Self-pay | Admitting: *Deleted

## 2017-11-16 NOTE — Progress Notes (Signed)
On 11-16-17 fax the consult note to epi source

## 2017-11-21 ENCOUNTER — Ambulatory Visit (INDEPENDENT_AMBULATORY_CARE_PROVIDER_SITE_OTHER): Payer: Medicare HMO | Admitting: *Deleted

## 2017-11-21 DIAGNOSIS — Z95810 Presence of automatic (implantable) cardiac defibrillator: Secondary | ICD-10-CM | POA: Diagnosis not present

## 2017-11-21 DIAGNOSIS — I5042 Chronic combined systolic (congestive) and diastolic (congestive) heart failure: Secondary | ICD-10-CM | POA: Diagnosis not present

## 2017-11-21 DIAGNOSIS — I428 Other cardiomyopathies: Secondary | ICD-10-CM

## 2017-11-21 NOTE — Progress Notes (Signed)
EPIC Encounter for ICM Monitoring  Patient Name: Kevin [name] is a 72 y.o. male Date: 11/21/2017 Primary Care Physican: [name], MD Primary Cardiologist:[name] Electrophysiologist: [name] Dry [place] office Bi-V Pacing: 99.1%                                                 Attempted call to patient and unable to reach.  Left detailed message regarding transmission.  Transmission reviewed.    Thoracic impedance normal for past 2 days.  Prescribed dosage: Furosemide 40 mg 1 tablet daily  Labs: 02/02/2017 Creatinine 1.34, BUN 20, Potassium 4.1, Sodium 137, EGFR 55.82 12/07/2016 Creatinine 1.13, BUN 13, Potassium 4.6, Sodium 135 09/10/2016 Creatinine 1.07, BUN 25, Potassium 4.4, Sodium 135 09/02/2016 Creatinine 1.15, BUN 23, Potassium 4.3, Sodium 136  06/08/2016 Creatinine 1.14, BUN 18, Potassium 4.1, Sodium 134 04/16/2016 Creatinine 0.96, BUN 12, Potassium 4.4, Sodium 137 08/22/2015 Creatinine 0.98, BUN 16, Potassium 4.4, Sodium 138  Recommendations: Left voice mail with ICM number and encouraged to call if experiencing any fluid symptoms..  Follow-up plan: ICM clinic phone appointment on 12/22/2017.    Copy of ICM check sent to Dr. Rayann Heman.   3 month ICM trend: 11/21/2017    1 Year ICM trend:       Rosalene Billings, RN 11/21/2017 4:36 PM

## 2017-11-22 NOTE — Progress Notes (Signed)
Remote ICD transmission.   

## 2017-11-23 LAB — CUP PACEART REMOTE DEVICE CHECK
Battery Remaining Longevity: 20 mo
Battery Voltage: 2.93 V
Brady Statistic AS VS Percent: 0.81 %
Brady Statistic RA Percent Paced: 0.05 %
Brady Statistic RV Percent Paced: 99.13 %
HIGH POWER IMPEDANCE MEASURED VALUE: 76 Ohm
Implantable Lead Implant Date: 20130123
Implantable Lead Implant Date: 20160316
Implantable Lead Location: 753860
Implantable Lead Model: 4598
Implantable Lead Model: 5076
Lead Channel Impedance Value: 1007 Ohm
Lead Channel Impedance Value: 399 Ohm
Lead Channel Impedance Value: 475 Ohm
Lead Channel Impedance Value: 589 Ohm
Lead Channel Impedance Value: 703 Ohm
Lead Channel Impedance Value: 855 Ohm
Lead Channel Impedance Value: 874 Ohm
Lead Channel Pacing Threshold Amplitude: 0.5 V
Lead Channel Sensing Intrinsic Amplitude: 12 mV
Lead Channel Sensing Intrinsic Amplitude: 12 mV
Lead Channel Setting Pacing Amplitude: 2.5 V
Lead Channel Setting Pacing Amplitude: 3.75 V
Lead Channel Setting Pacing Pulse Width: 0.4 ms
Lead Channel Setting Pacing Pulse Width: 1 ms
MDC IDC LEAD IMPLANT DT: 20160316
MDC IDC LEAD LOCATION: 753858
MDC IDC LEAD LOCATION: 753859
MDC IDC MSMT LEADCHNL LV IMPEDANCE VALUE: 1083 Ohm
MDC IDC MSMT LEADCHNL LV IMPEDANCE VALUE: 1083 Ohm
MDC IDC MSMT LEADCHNL LV IMPEDANCE VALUE: 418 Ohm
MDC IDC MSMT LEADCHNL LV IMPEDANCE VALUE: 551 Ohm
MDC IDC MSMT LEADCHNL LV IMPEDANCE VALUE: 570 Ohm
MDC IDC MSMT LEADCHNL LV PACING THRESHOLD AMPLITUDE: 3 V
MDC IDC MSMT LEADCHNL LV PACING THRESHOLD PULSEWIDTH: 1 ms
MDC IDC MSMT LEADCHNL RA IMPEDANCE VALUE: 399 Ohm
MDC IDC MSMT LEADCHNL RA PACING THRESHOLD AMPLITUDE: 0.5 V
MDC IDC MSMT LEADCHNL RA PACING THRESHOLD PULSEWIDTH: 0.4 ms
MDC IDC MSMT LEADCHNL RA SENSING INTR AMPL: 3.5 mV
MDC IDC MSMT LEADCHNL RA SENSING INTR AMPL: 3.5 mV
MDC IDC MSMT LEADCHNL RV PACING THRESHOLD PULSEWIDTH: 0.4 ms
MDC IDC PG IMPLANT DT: 20160316
MDC IDC SESS DTM: 20190114103426
MDC IDC SET LEADCHNL RA PACING AMPLITUDE: 2 V
MDC IDC SET LEADCHNL RV SENSING SENSITIVITY: 0.3 mV
MDC IDC STAT BRADY AP VP PERCENT: 0.05 %
MDC IDC STAT BRADY AP VS PERCENT: 0.01 %
MDC IDC STAT BRADY AS VP PERCENT: 99.14 %

## 2017-11-25 ENCOUNTER — Encounter: Payer: Self-pay | Admitting: Cardiology

## 2017-11-29 ENCOUNTER — Ambulatory Visit (INDEPENDENT_AMBULATORY_CARE_PROVIDER_SITE_OTHER): Payer: Medicare HMO | Admitting: Family Medicine

## 2017-11-29 VITALS — BP 130/70 | HR 75 | Temp 98.6°F | Ht 75.0 in | Wt 180.7 lb

## 2017-11-29 DIAGNOSIS — L02212 Cutaneous abscess of back [any part, except buttock]: Secondary | ICD-10-CM | POA: Diagnosis not present

## 2017-11-29 NOTE — Progress Notes (Signed)
Subjective:     Patient ID: Kevin [name], male   DOB: 10-18-1946, 72 y.o.   MRN: [id number]  HPI Patient is here with painful swollen cystic area on his mid thoracic back. She's had a cyst there apparently for years but over the past week or so said increasing swelling, pain, and erythema. No drainage. He was aware that this may need to be drained. No fevers or chills.  Past Medical History:  Diagnosis Date  . Agent orange exposure    in [place]  . Anemia   . Heart block AV second degree    a. s/p PPM implant with subsequent CRTD upgrade  . High cholesterol   . History of colon polyps   . Hypertension   . LBBB (left bundle branch block)   . Nonischemic cardiomyopathy ([place])    a. MDT CRTD upgrade 2016  . Pacemaker 08/27/2013   Dual-chamber Medtronic Adapta implanted January 2013 for bradycardia with alternating bundle branch block and 2:1 atrioventricular block   . Prostate cancer ([place])    "not been treated yet; on a wait and see" (01/22/2015)  . Type II diabetes mellitus ([place])    Past Surgical History:  Procedure Laterality Date  . BASAL CELL CARCINOMA EXCISION     "back, face, neck"  . BI-VENTRICULAR IMPLANTABLE CARDIOVERTER DEFIBRILLATOR UPGRADE N/A 01/22/2015   MDT CRTD upgrade by Dr Rayann Heman  . CARDIAC CATHETERIZATION  01/27/2012   normal coronaries, dilated LV c/w nonischemic cardiomyopathy  . EXAMINATION UNDER ANESTHESIA  04/06/2012   Procedure: EXAM UNDER ANESTHESIA;  Surgeon: Marcello Moores A. Cornett, MD;  Location: [place];  Service: General;  Laterality: N/A;  . INCISION AND DRAINAGE PERIRECTAL ABSCESS  ~ 2010; 2015  . INGUINAL HERNIA REPAIR Left 1980  . LEFT HEART CATHETERIZATION WITH CORONARY ANGIOGRAM N/A 01/27/2012   Procedure: LEFT HEART CATHETERIZATION WITH CORONARY ANGIOGRAM;  Surgeon: Troy Sine, MD;  Location: [facility] CATH LAB;  Service: Cardiovascular;  Laterality: N/A;  . NM MYOCAR PERF WALL MOTION  01/21/2012   mod-severe perfusion defect due to infarct/scar w/mild  perinfarct ischemia in the apical,basal inferoseptal,basal inferior,mid inferoseptal,midinferior and apical inferior regions  . PERMANENT PACEMAKER INSERTION N/A 12/01/2011   Medtronic implanted by Dr Sallyanne Kuster  . PROSTATE BIOPSY    . PROSTATE BIOPSY    . shrapnel surgery     "in [place]"  . TONSILLECTOMY      reports that he quit smoking about 33 years ago. His smoking use included cigarettes. He has a 20.00 pack-year smoking history. he has never used smokeless tobacco. He reports that he drinks alcohol. He reports that he does not use drugs. family history includes Heart disease in his father; Leukemia in his father; Ovarian cancer in his mother. Allergies  Allergen Reactions  . Sulfa Antibiotics Swelling    Swelling of lips only.     Review of Systems  Constitutional: Negative for chills and fever.       Objective:   Physical Exam  Constitutional: He appears well-developed and well-nourished.  Cardiovascular: Normal rate.  Pulmonary/Chest: Effort normal and breath sounds normal. No respiratory distress. He has no wheezes. He has no rales.  Skin:  Patient has infected sebaceous cyst mid thoracic back just left of midline. He has some fluctuance. Area of erythema approximately 3-4 cm. Slightly tender to touch and slightly warm to touch       Assessment:     Abscessed sebaceous cyst thoracic back    Plan:     -We  discussed risk and benefits of incision and drainage including risk of bleeding and patient consented. Skin prepped with Betadine. Anesthesia with 1% Xylocaine plain. Using #11 blade made a linear incision about 2 cm in length. We drained contents of sebaceous cyst along with fair amount of pus. Minimal bleeding. Patient tolerated well. Use curved hemostats to explore contents of cyst pocket. Cavity packed with quarter-inch iodoform gauze.  Outer dressing applied -Wound care instruction given -Follow-up in 3 days for packing removal  [name] MD [place]  Primary Care at [facility]

## 2017-11-29 NOTE — Patient Instructions (Signed)

## 2017-11-30 ENCOUNTER — Ambulatory Visit (INDEPENDENT_AMBULATORY_CARE_PROVIDER_SITE_OTHER): Payer: Medicare HMO | Admitting: Family Medicine

## 2017-11-30 ENCOUNTER — Telehealth: Payer: Self-pay | Admitting: Family Medicine

## 2017-11-30 ENCOUNTER — Encounter: Payer: Self-pay | Admitting: Family Medicine

## 2017-11-30 VITALS — BP 122/62 | HR 77 | Temp 97.7°F | Ht 75.0 in | Wt 183.0 lb

## 2017-11-30 DIAGNOSIS — L02212 Cutaneous abscess of back [any part, except buttock]: Secondary | ICD-10-CM

## 2017-11-30 NOTE — Telephone Encounter (Signed)
Left message on machine for patient to schedule an appointment for this evening

## 2017-11-30 NOTE — Telephone Encounter (Signed)
Copied from [place] [id number]. Topic: General - Other >> Nov 30, 2017  9:40 AM Darl Householder, RMA wrote: Reason for CRM: Patient was seen yesterday and had a abscess lanced and packed, pt is stating that he has a lot of drainage coming through bandage and thinks he may need it packed again before Friday, pt is requesting a call back concerning what he should do

## 2017-11-30 NOTE — Progress Notes (Signed)
Subjective:     Patient ID: Kevin [name], male   DOB: 28-Feb-1946, 72 y.o.   MRN: [id number]  HPI Pt had abscessed sebaceous cyst of back which was I and D ed yesterday.  We had planned to follow up on Friday, However, he called in earlier today with complaints of increased drainage.  This does feel better with less pain.  No fever.  Past Medical History:  Diagnosis Date  . Agent orange exposure    in [place]  . Anemia   . Heart block AV second degree    a. s/p PPM implant with subsequent CRTD upgrade  . High cholesterol   . History of colon polyps   . Hypertension   . LBBB (left bundle branch block)   . Nonischemic cardiomyopathy ([place])    a. MDT CRTD upgrade 2016  . Pacemaker 08/27/2013   Dual-chamber Medtronic Adapta implanted January 2013 for bradycardia with alternating bundle branch block and 2:1 atrioventricular block   . Prostate cancer ([place])    "not been treated yet; on a wait and see" (01/22/2015)  . Type II diabetes mellitus ([place])    Past Surgical History:  Procedure Laterality Date  . BASAL CELL CARCINOMA EXCISION     "back, face, neck"  . BI-VENTRICULAR IMPLANTABLE CARDIOVERTER DEFIBRILLATOR UPGRADE N/A 01/22/2015   MDT CRTD upgrade by Dr Rayann Heman  . CARDIAC CATHETERIZATION  01/27/2012   normal coronaries, dilated LV c/w nonischemic cardiomyopathy  . EXAMINATION UNDER ANESTHESIA  04/06/2012   Procedure: EXAM UNDER ANESTHESIA;  Surgeon: Marcello Moores A. Cornett, MD;  Location: [facility];  Service: General;  Laterality: N/A;  . INCISION AND DRAINAGE PERIRECTAL ABSCESS  ~ 2010; 2015  . INGUINAL HERNIA REPAIR Left 1980  . LEFT HEART CATHETERIZATION WITH CORONARY ANGIOGRAM N/A 01/27/2012   Procedure: LEFT HEART CATHETERIZATION WITH CORONARY ANGIOGRAM;  Surgeon: Troy Sine, MD;  Location: [facility] CATH LAB;  Service: Cardiovascular;  Laterality: N/A;  . NM MYOCAR PERF WALL MOTION  01/21/2012   mod-severe perfusion defect due to infarct/scar w/mild perinfarct ischemia in the apical,basal  inferoseptal,basal inferior,mid inferoseptal,midinferior and apical inferior regions  . PERMANENT PACEMAKER INSERTION N/A 12/01/2011   Medtronic implanted by Dr Sallyanne Kuster  . PROSTATE BIOPSY    . PROSTATE BIOPSY    . shrapnel surgery     "in [place]"  . TONSILLECTOMY      reports that he quit smoking about 33 years ago. His smoking use included cigarettes. He has a 20.00 pack-year smoking history. he has never used smokeless tobacco. He reports that he drinks alcohol. He reports that he does not use drugs. family history includes Heart disease in his father; Leukemia in his father; Ovarian cancer in his mother. Allergies  Allergen Reactions  . Sulfa Antibiotics Swelling    Swelling of lips only.     Review of Systems  Constitutional: Negative for chills and fever.       Objective:   Physical Exam  Constitutional: He appears well-developed and well-nourished. No distress.  Cardiovascular: Normal rate and regular rhythm.  Skin:  Packing removed from wound cavity. Less erythema of skin and less tender.  Still has some mild purulent drainage.       Assessment:     Abscess of back- improved    Plan:     -cavity re-packed today -return Friday to reassess and removed packing.  [name] MD [facility] at [facility]

## 2017-11-30 NOTE — Telephone Encounter (Signed)
Let's have him come back in late today

## 2017-12-02 ENCOUNTER — Encounter: Payer: Self-pay | Admitting: Family Medicine

## 2017-12-02 ENCOUNTER — Ambulatory Visit (INDEPENDENT_AMBULATORY_CARE_PROVIDER_SITE_OTHER): Payer: Medicare HMO | Admitting: Family Medicine

## 2017-12-02 VITALS — BP 110/70 | HR 75 | Temp 98.6°F | Wt 183.7 lb

## 2017-12-02 DIAGNOSIS — L02212 Cutaneous abscess of back [any part, except buttock]: Secondary | ICD-10-CM

## 2017-12-02 NOTE — Patient Instructions (Signed)
Pull packing by Sunday or Monday Keep clean with soap and water

## 2017-12-02 NOTE — Progress Notes (Signed)
Subjective:     Patient ID: Kevin [name], male   DOB: 09-21-1946, 72 y.o.   MRN: [id number]  HPI Here for follow-up regarding abscessed sebaceous cyst of the back. We performed incision and drainage on 11/29/17. He's had persistent drainage and this was repacked couple days ago. They have seen less drainage each day. He has only minimal soreness. No fever.  Past Medical History:  Diagnosis Date  . Agent orange exposure    in [place]  . Anemia   . Heart block AV second degree    a. s/p PPM implant with subsequent CRTD upgrade  . High cholesterol   . History of colon polyps   . Hypertension   . LBBB (left bundle branch block)   . Nonischemic cardiomyopathy ([place])    a. MDT CRTD upgrade 2016  . Pacemaker 08/27/2013   Dual-chamber Medtronic Adapta implanted January 2013 for bradycardia with alternating bundle branch block and 2:1 atrioventricular block   . Prostate cancer ([place])    "not been treated yet; on a wait and see" (01/22/2015)  . Type II diabetes mellitus ([place])    Past Surgical History:  Procedure Laterality Date  . BASAL CELL CARCINOMA EXCISION     "back, face, neck"  . BI-VENTRICULAR IMPLANTABLE CARDIOVERTER DEFIBRILLATOR UPGRADE N/A 01/22/2015   MDT CRTD upgrade by Dr Rayann Heman  . CARDIAC CATHETERIZATION  01/27/2012   normal coronaries, dilated LV c/w nonischemic cardiomyopathy  . EXAMINATION UNDER ANESTHESIA  04/06/2012   Procedure: EXAM UNDER ANESTHESIA;  Surgeon: Marcello Moores A. Cornett, MD;  Location: [facility];  Service: General;  Laterality: N/A;  . INCISION AND DRAINAGE PERIRECTAL ABSCESS  ~ 2010; 2015  . INGUINAL HERNIA REPAIR Left 1980  . LEFT HEART CATHETERIZATION WITH CORONARY ANGIOGRAM N/A 01/27/2012   Procedure: LEFT HEART CATHETERIZATION WITH CORONARY ANGIOGRAM;  Surgeon: Troy Sine, MD;  Location: [facility] CATH LAB;  Service: Cardiovascular;  Laterality: N/A;  . NM MYOCAR PERF WALL MOTION  01/21/2012   mod-severe perfusion defect due to infarct/scar w/mild perinfarct  ischemia in the apical,basal inferoseptal,basal inferior,mid inferoseptal,midinferior and apical inferior regions  . PERMANENT PACEMAKER INSERTION N/A 12/01/2011   Medtronic implanted by Dr Sallyanne Kuster  . PROSTATE BIOPSY    . PROSTATE BIOPSY    . shrapnel surgery     "in [place]"  . TONSILLECTOMY      reports that he quit smoking about 33 years ago. His smoking use included cigarettes. He has a 20.00 pack-year smoking history. he has never used smokeless tobacco. He reports that he drinks alcohol. He reports that he does not use drugs. family history includes Heart disease in his father; Leukemia in his father; Ovarian cancer in his mother. Allergies  Allergen Reactions  . Sulfa Antibiotics Swelling    Swelling of lips only.     Review of Systems  Constitutional: Negative for chills and fever.       Objective:   Physical Exam  Constitutional: He appears well-developed and well-nourished.  Cardiovascular: Normal rate and regular rhythm.  Skin:  Wound cavity examined. He still has some debris within the wound cavity. We anesthetized with 1% plain Xylocaine. Using some curved hemostats we removed some further debris and pieces of the wall of the sebaceous cyst. These were scarred to the sides. No purulent drainage today. Erythema of the skin essentially resolved       Assessment:     Abscessed sebaceous cyst of back improving    Plan:     -We removed further  debris as above and repacked the wound cavity. They will remove packing either Sunday or Monday and then keep clean with soap and water and keep covered until healed over. Follow-up promptly for any recurrent erythema, swelling, or other concerns.  [name] MD [facility] at [facility]

## 2017-12-22 ENCOUNTER — Ambulatory Visit (INDEPENDENT_AMBULATORY_CARE_PROVIDER_SITE_OTHER): Payer: Medicare HMO

## 2017-12-22 DIAGNOSIS — Z95810 Presence of automatic (implantable) cardiac defibrillator: Secondary | ICD-10-CM | POA: Diagnosis not present

## 2017-12-22 DIAGNOSIS — I5042 Chronic combined systolic (congestive) and diastolic (congestive) heart failure: Secondary | ICD-10-CM

## 2017-12-22 NOTE — Progress Notes (Signed)
EPIC Encounter for ICM Monitoring  Patient Name: Kevin [name] is a 72 y.o. male Date: 12/22/2017 Primary Care Physican: [name], MD Primary Cardiologist:[name] Electrophysiologist: [name] Dry [place] office Bi-V Pacing: 99%      Attempted call to patient and unable to reach.  Left detailed message regarding transmission.  Transmission reviewed.    Thoracic impedance abnormal suggesting fluid accumulation but has shown improvement.  Prescribed dosage: Furosemide 40 mg 1 tablet daily  Labs: 02/02/2017 Creatinine 1.34, BUN 20, Potassium 4.1, Sodium 137, EGFR 55.82 12/07/2016 Creatinine 1.13, BUN 13, Potassium 4.6, Sodium 135 09/10/2016 Creatinine 1.07, BUN 25, Potassium 4.4, Sodium 135 09/02/2016 Creatinine 1.15, BUN 23, Potassium 4.3, Sodium 136  06/08/2016 Creatinine 1.14, BUN 18, Potassium 4.1, Sodium 134 04/16/2016 Creatinine 0.96, BUN 12, Potassium 4.4, Sodium 137 08/22/2015 Creatinine 0.98, BUN 16, Potassium 4.4, Sodium 138  Recommendations: Left voice mail with ICM number and encouraged to call if experiencing any fluid symptoms.  Follow-up plan: ICM clinic phone appointment on 12/29/2017 to recheck fluid levels.    Copy of ICM check sent to Dr. Sallyanne Kuster and Dr. Rayann Heman.   3 month ICM trend: 12/22/2017    1 Year ICM trend:       Rosalene Billings, RN 12/22/2017 8:47 AM

## 2017-12-29 ENCOUNTER — Ambulatory Visit (INDEPENDENT_AMBULATORY_CARE_PROVIDER_SITE_OTHER): Payer: Self-pay

## 2017-12-29 DIAGNOSIS — Z95810 Presence of automatic (implantable) cardiac defibrillator: Secondary | ICD-10-CM

## 2017-12-29 DIAGNOSIS — I5042 Chronic combined systolic (congestive) and diastolic (congestive) heart failure: Secondary | ICD-10-CM

## 2017-12-30 ENCOUNTER — Telehealth: Payer: Self-pay

## 2017-12-30 NOTE — Progress Notes (Signed)
EPIC Encounter for ICM Monitoring  Patient Name: Kevin [name] is a 72 y.o. male Date: 12/30/2017 Primary Care Physican: [name], MD Primary Cardiologist:[name] Electrophysiologist: [name] Dry [place] office Bi-V Pacing: 99.4%             Attempted call to patient and unable to reach.  Left detailed message regarding transmission.  Transmission reviewed.    Thoracic impedance normal.  Prescribed dosage: Furosemide 40 mg 1 tablet daily  Labs: 02/02/2017 Creatinine 1.34, BUN 20, Potassium 4.1, Sodium 137, EGFR 55.82 12/07/2016 Creatinine 1.13, BUN 13, Potassium 4.6, Sodium 135 09/10/2016 Creatinine 1.07, BUN 25, Potassium 4.4, Sodium 135 09/02/2016 Creatinine 1.15, BUN 23, Potassium 4.3, Sodium 136  06/08/2016 Creatinine 1.14, BUN 18, Potassium 4.1, Sodium 134 04/16/2016 Creatinine 0.96, BUN 12, Potassium 4.4, Sodium 137 08/22/2015 Creatinine 0.98, BUN 16, Potassium 4.4, Sodium 138  Recommendations: Left voice mail with ICM number and encouraged to call if experiencing any fluid symptoms.  Follow-up plan: ICM clinic phone appointment on 01/26/2018.    Copy of ICM check sent to Dr. Rayann Heman.   3 month ICM trend: 12/29/2017    1 Year ICM trend:       Rosalene Billings, RN 12/30/2017 10:15 AM

## 2017-12-30 NOTE — Telephone Encounter (Signed)
Remote ICM transmission received.  Attempted call to patient and left detailed message per DPR regarding transmission and next ICM scheduled for 01/26/2018.  Advised to return call for any fluid symptoms or questions.    

## 2018-01-13 ENCOUNTER — Other Ambulatory Visit: Payer: Self-pay | Admitting: Family Medicine

## 2018-01-16 ENCOUNTER — Encounter: Payer: Self-pay | Admitting: Family Medicine

## 2018-01-16 ENCOUNTER — Other Ambulatory Visit: Payer: Self-pay | Admitting: Endocrinology

## 2018-01-16 ENCOUNTER — Ambulatory Visit (INDEPENDENT_AMBULATORY_CARE_PROVIDER_SITE_OTHER): Payer: Medicare HMO | Admitting: Family Medicine

## 2018-01-16 VITALS — BP 110/70 | HR 88 | Temp 98.3°F | Wt 183.5 lb

## 2018-01-16 DIAGNOSIS — L02413 Cutaneous abscess of right upper limb: Secondary | ICD-10-CM | POA: Diagnosis not present

## 2018-01-16 NOTE — Progress Notes (Signed)
Subjective:     Patient ID: Kevin [name], male   DOB: [birth year], 72 y.o.   MRN: [id number]  HPI Patient seen with swollen painful cystic lesion right anterior arm. He has history of sebaceous cyst. Current cyst started to increase in size about 4 days ago and has become progressively more painful and more fluctuant and slightly erythematous on the surface. No drainage. No alleviating factors. No fever or chills.  Past Medical History:  Diagnosis Date  . Agent orange exposure    in [place]  . Anemia   . Heart block AV second degree    a. s/p PPM implant with subsequent CRTD upgrade  . High cholesterol   . History of colon polyps   . Hypertension   . LBBB (left bundle branch block)   . Nonischemic cardiomyopathy ([place])    a. MDT CRTD upgrade 2016  . Pacemaker 08/27/2013   Dual-chamber Medtronic Adapta implanted January 2013 for bradycardia with alternating bundle branch block and 2:1 atrioventricular block   . Prostate cancer ([place])    "not been treated yet; on a wait and see" (01/22/2015)  . Type II diabetes mellitus ([place])    Past Surgical History:  Procedure Laterality Date  . BASAL CELL CARCINOMA EXCISION     "back, face, neck"  . BI-VENTRICULAR IMPLANTABLE CARDIOVERTER DEFIBRILLATOR UPGRADE N/A 01/22/2015   MDT CRTD upgrade by Dr Rayann Heman  . CARDIAC CATHETERIZATION  01/27/2012   normal coronaries, dilated LV c/w nonischemic cardiomyopathy  . EXAMINATION UNDER ANESTHESIA  04/06/2012   Procedure: EXAM UNDER ANESTHESIA;  Surgeon: Marcello Moores A. Cornett, MD;  Location: [facility];  Service: General;  Laterality: N/A;  . INCISION AND DRAINAGE PERIRECTAL ABSCESS  ~ 2010; 2015  . INGUINAL HERNIA REPAIR Left 1980  . LEFT HEART CATHETERIZATION WITH CORONARY ANGIOGRAM N/A 01/27/2012   Procedure: LEFT HEART CATHETERIZATION WITH CORONARY ANGIOGRAM;  Surgeon: Troy Sine, MD;  Location: [facility] CATH LAB;  Service: Cardiovascular;  Laterality: N/A;  . NM MYOCAR PERF WALL MOTION  01/21/2012   mod-severe  perfusion defect due to infarct/scar w/mild perinfarct ischemia in the apical,basal inferoseptal,basal inferior,mid inferoseptal,midinferior and apical inferior regions  . PERMANENT PACEMAKER INSERTION N/A 12/01/2011   Medtronic implanted by Dr Sallyanne Kuster  . PROSTATE BIOPSY    . PROSTATE BIOPSY    . shrapnel surgery     "in [place]"  . TONSILLECTOMY      reports that he quit smoking about 33 years ago. His smoking use included cigarettes. He has a 20.00 pack-year smoking history. he has never used smokeless tobacco. He reports that he drinks alcohol. He reports that he does not use drugs. family history includes Heart disease in his father; Leukemia in his father; Ovarian cancer in his mother. Allergies  Allergen Reactions  . Sulfa Antibiotics Swelling    Swelling of lips only.     Review of Systems  Constitutional: Negative for chills and fever.  Gastrointestinal: Negative for nausea and vomiting.       Objective:   Physical Exam  Constitutional: He appears well-developed and well-nourished.  Cardiovascular: Normal rate and regular rhythm.  Pulmonary/Chest: Effort normal and breath sounds normal. No respiratory distress. He has no wheezes. He has no rales.  Skin:  Patient has large cystic swelling right anterior arm with some erythema along the inferior portion and some fluctuance. Tender to palpation. Area of swelling is approximately 2 cm diameter       Assessment:     Abscess sebaceous this right anterior  arm    Plan:     -Discussed risk and benefits of incision and drainage and patient consented. Skin was prepped with Betadine. Anesthesia with 1% plain Xylocaine. Using #11 blade made approximately 2 cm linear incision over area of maximum fluctuance. We expressed significant amount of pus and contents of sebaceous cyst sac. We used curved hemostats to explore wound cavity. Freed up some loculations. Wound was packed with quarter-inch iodoform gauze. Minimal bleeding. Patient  tolerated well -Keep wound dry -Follow-up in 2 days for packing removal and wound reassessment  [name] MD [facility] at [facility]

## 2018-01-16 NOTE — Patient Instructions (Addendum)

## 2018-01-18 ENCOUNTER — Ambulatory Visit (INDEPENDENT_AMBULATORY_CARE_PROVIDER_SITE_OTHER): Payer: Medicare HMO | Admitting: Family Medicine

## 2018-01-18 VITALS — BP 120/80 | HR 85 | Temp 98.2°F | Wt 179.6 lb

## 2018-01-18 DIAGNOSIS — L02413 Cutaneous abscess of right upper limb: Secondary | ICD-10-CM

## 2018-01-18 NOTE — Progress Notes (Signed)
Subjective:     Patient ID: Kevin [name], male   DOB: 1946-04-03, 72 y.o.   MRN: [id number]  HPI Patient seen for follow-up right arm abscess. I and D of abscessed sebaceous cyst 2 days ago.   Much less painful. No fevers or chills. Has changed outer dressings couple times secondary to drainage.  Past Medical History:  Diagnosis Date  . Agent orange exposure    in [place]  . Anemia   . Heart block AV second degree    a. s/p PPM implant with subsequent CRTD upgrade  . High cholesterol   . History of colon polyps   . Hypertension   . LBBB (left bundle branch block)   . Nonischemic cardiomyopathy ([place])    a. MDT CRTD upgrade 2016  . Pacemaker 08/27/2013   Dual-chamber Medtronic Adapta implanted January 2013 for bradycardia with alternating bundle branch block and 2:1 atrioventricular block   . Prostate cancer ([place])    "not been treated yet; on a wait and see" (01/22/2015)  . Type II diabetes mellitus ([place])    Past Surgical History:  Procedure Laterality Date  . BASAL CELL CARCINOMA EXCISION     "back, face, neck"  . BI-VENTRICULAR IMPLANTABLE CARDIOVERTER DEFIBRILLATOR UPGRADE N/A 01/22/2015   MDT CRTD upgrade by Dr Rayann Heman  . CARDIAC CATHETERIZATION  01/27/2012   normal coronaries, dilated LV c/w nonischemic cardiomyopathy  . EXAMINATION UNDER ANESTHESIA  04/06/2012   Procedure: EXAM UNDER ANESTHESIA;  Surgeon: Marcello Moores A. Cornett, MD;  Location: [facility];  Service: General;  Laterality: N/A;  . INCISION AND DRAINAGE PERIRECTAL ABSCESS  ~ 2010; 2015  . INGUINAL HERNIA REPAIR Left 1980  . LEFT HEART CATHETERIZATION WITH CORONARY ANGIOGRAM N/A 01/27/2012   Procedure: LEFT HEART CATHETERIZATION WITH CORONARY ANGIOGRAM;  Surgeon: Troy Sine, MD;  Location: [facility] CATH LAB;  Service: Cardiovascular;  Laterality: N/A;  . NM MYOCAR PERF WALL MOTION  01/21/2012   mod-severe perfusion defect due to infarct/scar w/mild perinfarct ischemia in the apical,basal inferoseptal,basal inferior,mid  inferoseptal,midinferior and apical inferior regions  . PERMANENT PACEMAKER INSERTION N/A 12/01/2011   Medtronic implanted by Dr Sallyanne Kuster  . PROSTATE BIOPSY    . PROSTATE BIOPSY    . shrapnel surgery     "in [place]"  . TONSILLECTOMY      reports that he quit smoking about 33 years ago. His smoking use included cigarettes. He has a 20.00 pack-year smoking history. he has never used smokeless tobacco. He reports that he drinks alcohol. He reports that he does not use drugs. family history includes Heart disease in his father; Leukemia in his father; Ovarian cancer in his mother. Allergies  Allergen Reactions  . Sulfa Antibiotics Swelling    Swelling of lips only.     Review of Systems  Constitutional: Negative for chills and fever.       Objective:   Physical Exam  Constitutional: He appears well-developed and well-nourished.  Skin:  Right arm wound is assessed. He has much less erythema and swelling. Packing is removed. He has a few small remnants of cyst in the wound cavity but overall much less purulence. No fluctuance. Less tender.       Assessment:     Abscess right arm- improved following I and D.    Plan:     -Repacked wound cavity with quarter inch iodoform gauze -Return in 2 days for packing removal and reassessment  [name] MD [facility] at [facility]

## 2018-01-20 ENCOUNTER — Ambulatory Visit (INDEPENDENT_AMBULATORY_CARE_PROVIDER_SITE_OTHER): Payer: Medicare HMO | Admitting: Family Medicine

## 2018-01-20 ENCOUNTER — Encounter: Payer: Self-pay | Admitting: Family Medicine

## 2018-01-20 VITALS — BP 110/68 | HR 74 | Temp 98.3°F | Wt 179.6 lb

## 2018-01-20 DIAGNOSIS — L02413 Cutaneous abscess of right upper limb: Secondary | ICD-10-CM

## 2018-01-20 NOTE — Patient Instructions (Signed)
Clean daily with soap and water. Follow up for any recurrent redness or other concerns

## 2018-01-20 NOTE — Progress Notes (Signed)
Subjective:     Patient ID: Kevin [name], male   DOB: [birth year], 72 y.o.   MRN: [id number]  HPI Follow-up abscess right arm. Incision and drainage earlier in the week. This was repacked 2 days ago. Having decreasing drainage each day. No pain. No fever. Feels much better overall  Review of Systems  Constitutional: Negative for chills and fever.       Objective:   Physical Exam  Constitutional: He appears well-developed and well-nourished.  Skin:  Packing is removed from right arm wound. He still has some remnants of cyst and deep within the wound but no purulence. Cellulitis changes have fully resolved.       Assessment:     Abscess right arm improved    Plan:     -Leave out packing in this time -Clean daily with good antibacterial soap and keep covered until skin is sealed over at surface -Follow-up promptly for any recurrent redness or other concerns  [name] MD [facility] at [facility]

## 2018-01-26 ENCOUNTER — Telehealth: Payer: Self-pay

## 2018-01-26 ENCOUNTER — Ambulatory Visit (INDEPENDENT_AMBULATORY_CARE_PROVIDER_SITE_OTHER): Payer: Medicare HMO

## 2018-01-26 DIAGNOSIS — I5042 Chronic combined systolic (congestive) and diastolic (congestive) heart failure: Secondary | ICD-10-CM

## 2018-01-26 DIAGNOSIS — Z95810 Presence of automatic (implantable) cardiac defibrillator: Secondary | ICD-10-CM | POA: Diagnosis not present

## 2018-01-26 NOTE — Telephone Encounter (Signed)
Remote ICM transmission received.  Attempted call to patient and left message to return call regarding transmission. 

## 2018-01-26 NOTE — Progress Notes (Signed)
EPIC Encounter for ICM Monitoring  Patient Name: Kevin [name] is a 72 y.o. male Date: 01/26/2018 Primary Care Physican: [name], MD Primary Cardiologist:[name] Electrophysiologist: [name] Dry Weight:179lbs(at 01/20/18 officevisit) Bi-V Pacing: 99.1%      Attempted call to patient and unable to reach.  Left message to return call.  Transmission reviewed.    Thoracic impedance abnormal suggesting fluid accumulation since 01/03/2018.  Prescribed dosage: Furosemide 40 mg 1 tablet daily  Labs: 02/02/2017 Creatinine 1.34, BUN 20, Potassium 4.1, Sodium 137, EGFR 55.82 12/07/2016 Creatinine 1.13, BUN 13, Potassium 4.6, Sodium 135 09/10/2016 Creatinine 1.07, BUN 25, Potassium 4.4, Sodium 135 09/02/2016 Creatinine 1.15, BUN 23, Potassium 4.3, Sodium 136  06/08/2016 Creatinine 1.14, BUN 18, Potassium 4.1, Sodium 134 04/16/2016 Creatinine 0.96, BUN 12, Potassium 4.4, Sodium 137 08/22/2015 Creatinine 0.98, BUN 16, Potassium 4.4, Sodium 138  Recommendations: NONE - Unable to reach.  Follow-up plan: ICM clinic phone appointment on 02/03/2018 to recheck fluid levels.    Copy of ICM check sent to Dr. Rayann Heman and Dr. Sallyanne Kuster.   3 month ICM trend: 01/26/2018    1 Year ICM trend:       Rosalene Billings, RN 01/26/2018 11:39 AM

## 2018-01-30 ENCOUNTER — Encounter: Payer: Self-pay | Admitting: Family Medicine

## 2018-01-30 ENCOUNTER — Ambulatory Visit (INDEPENDENT_AMBULATORY_CARE_PROVIDER_SITE_OTHER): Payer: Medicare HMO | Admitting: Family Medicine

## 2018-01-30 VITALS — BP 110/70 | HR 82 | Temp 97.7°F | Wt 180.1 lb

## 2018-01-30 DIAGNOSIS — N4889 Other specified disorders of penis: Secondary | ICD-10-CM | POA: Diagnosis not present

## 2018-01-30 MED ORDER — FREESTYLE LIBRE 14 DAY SENSOR MISC: 1.0000 | Freq: Once | 3 refills | Status: AC

## 2018-01-30 MED ORDER — FREESTYLE LIBRE 14 DAY READER DEVI: 1.0000 | Freq: Once | 0 refills | Status: AC

## 2018-01-30 NOTE — Patient Instructions (Signed)
Keep clean with soap and water Consider small amount of vaseline topically each day to prevent cracking We will call you with culture results.

## 2018-01-30 NOTE — Progress Notes (Signed)
Subjective:     Patient ID: Kevin [name], male   DOB: 09-20-1946, 72 y.o.   MRN: [id number]  HPI Patient seen with apparently about one week history of some sore areas mostly on the ventral surface of his penis. 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 He was treated by physician at [facility]. He relates that he had what sounds like some "skin grafts" following some sort of treatment. He denies any history of recurrent HPV or any other history of STDs. He has one partner now. No known exposures to herpes. Current areas are sore to touch. He's tried some Neosporin with mild relief. He does not recall any blisters. No dysuria. No fevers or chills. No history of trauma  Past Medical History:  Diagnosis Date  . Agent orange exposure    in [place]  . Anemia   . Heart block AV second degree    a. s/p PPM implant with subsequent CRTD upgrade  . High cholesterol   . History of colon polyps   . Hypertension   . LBBB (left bundle branch block)   . Nonischemic cardiomyopathy ([place])    a. MDT CRTD upgrade 2016  . Pacemaker 08/27/2013   Dual-chamber Medtronic Adapta implanted January 2013 for bradycardia with alternating bundle branch block and 2:1 atrioventricular block   . Prostate cancer ([place])    "not been treated yet; on a wait and see" (01/22/2015)  . Type II diabetes mellitus ([place])    Past Surgical History:  Procedure Laterality Date  . BASAL CELL CARCINOMA EXCISION     "back, face, neck"  . BI-VENTRICULAR IMPLANTABLE CARDIOVERTER DEFIBRILLATOR UPGRADE N/A 01/22/2015   MDT CRTD upgrade by Dr Rayann Heman  . CARDIAC CATHETERIZATION  01/27/2012   normal coronaries, dilated LV c/w nonischemic cardiomyopathy  . EXAMINATION UNDER ANESTHESIA  04/06/2012   Procedure: EXAM UNDER ANESTHESIA;  Surgeon: Marcello Moores A. Cornett, MD;  Location: [place];  Service: General;  Laterality: N/A;  . INCISION AND DRAINAGE PERIRECTAL ABSCESS  ~ 2010; 2015  . INGUINAL HERNIA REPAIR Left 1980  . LEFT  HEART CATHETERIZATION WITH CORONARY ANGIOGRAM N/A 01/27/2012   Procedure: LEFT HEART CATHETERIZATION WITH CORONARY ANGIOGRAM;  Surgeon: Troy Sine, MD;  Location: [facility] C[facility] LAB;  Service: Cardiovascular;  Laterality: N/A;  . NM MYOCAR PERF WALL MOTION  01/21/2012   mod-severe perfusion defect due to infarct/scar w/mild perinfarct ischemia in the apical,basal inferoseptal,basal inferior,mid inferoseptal,midinferior and apical inferior regions  . PERMANENT PACEMAKER INSERTION N/A 12/01/2011   Medtronic implanted by Dr Sallyanne Kuster  . PROSTATE BIOPSY    . PROSTATE BIOPSY    . shrapnel surgery     "in [place]"  . TONSILLECTOMY      reports that he quit smoking about 33 years ago. His smoking use included cigarettes. He has a 20.00 pack-year smoking history. He has never used smokeless tobacco. He reports that he drinks alcohol. He reports that he does not use drugs. family history includes Heart disease in his father; Leukemia in his father; Ovarian cancer in his mother. Allergies  Allergen Reactions  . Sulfa Antibiotics Swelling    Swelling of lips only.     Review of Systems  Constitutional: Negative for chills and fever.  Genitourinary: Positive for genital sores and penile pain. Negative for discharge, dysuria and scrotal swelling.       Objective:   Physical Exam  Constitutional: He appears well-developed and well-nourished.  Cardiovascular: Normal rate and regular rhythm.  Pulmonary/Chest:  Effort normal and breath sounds normal. No respiratory distress. He has no wheezes. He has no rales.  Skin:  Patient has a couple of ulcerative looking areas that appear to be in stages of healing including right base of penis and a couple on the midshaft of the penis       Assessment:     Penile sores. This does not appear compatible with likely HPV. Question herpetic though he has no history of herpes.    Plan:     -Viral culture obtained though we explained to patient this may be too far  into healing to have significant yield with culture -Keep area clean with soap and water. -apply a minimal amount of Vaseline to avoid friction -Touch base if these or not fully healed 2 weeks  [name] MD [facility] at [facility]

## 2018-02-03 ENCOUNTER — Ambulatory Visit (INDEPENDENT_AMBULATORY_CARE_PROVIDER_SITE_OTHER): Payer: Self-pay

## 2018-02-03 ENCOUNTER — Telehealth: Payer: Self-pay

## 2018-02-03 DIAGNOSIS — I5042 Chronic combined systolic (congestive) and diastolic (congestive) heart failure: Secondary | ICD-10-CM

## 2018-02-03 DIAGNOSIS — Z95810 Presence of automatic (implantable) cardiac defibrillator: Secondary | ICD-10-CM

## 2018-02-03 NOTE — Progress Notes (Signed)
EPIC Encounter for ICM Monitoring  Patient Name: Kevin [name] is a 72 y.o. male Date: 02/03/2018 Primary Care Physican: [name], MD Primary Cardiologist:[name] Electrophysiologist: [name] Dry Weight:180lbs(at 01/30/18 officevisit) Bi-V Pacing: 99%      Attempted call to patient and unable to reach.  Left message to return call regarding transmission.  Transmission reviewed.    Thoracic impedance abnormal suggesting fluid accumulation since 01/03/2018.  Prescribed dosage: Furosemide 40 mg 1 tablet daily  Labs: 02/02/2017 Creatinine 1.34, BUN 20, Potassium 4.1, Sodium 137, EGFR 55.82 12/07/2016 Creatinine 1.13, BUN 13, Potassium 4.6, Sodium 135 09/10/2016 Creatinine 1.07, BUN 25, Potassium 4.4, Sodium 135 09/02/2016 Creatinine 1.15, BUN 23, Potassium 4.3, Sodium 136  06/08/2016 Creatinine 1.14, BUN 18, Potassium 4.1, Sodium 134 04/16/2016 Creatinine 0.96, BUN 12, Potassium 4.4, Sodium 137 08/22/2015 Creatinine 0.98, BUN 16, Potassium 4.4, Sodium 138  Recommendations: NONE - Unable to reach.  Follow-up plan: ICM clinic phone appointment on 02/09/2018 (manual send) to recheck fluid levels.    Copy of ICM check sent to Dr. Sallyanne Kuster and Dr. Rayann Heman.   3 month ICM trend: 02/03/2018    1 Year ICM trend:       Rosalene Billings, RN 02/03/2018 9:35 AM

## 2018-02-03 NOTE — Telephone Encounter (Signed)
Remote ICM transmission received.  Attempted call to patient and left message per DPR to return call regarding transmission.  

## 2018-02-09 ENCOUNTER — Telehealth: Payer: Self-pay | Admitting: Cardiology

## 2018-02-09 ENCOUNTER — Ambulatory Visit (INDEPENDENT_AMBULATORY_CARE_PROVIDER_SITE_OTHER): Payer: Medicare HMO

## 2018-02-09 ENCOUNTER — Telehealth: Payer: Self-pay

## 2018-02-09 DIAGNOSIS — I5042 Chronic combined systolic (congestive) and diastolic (congestive) heart failure: Secondary | ICD-10-CM

## 2018-02-09 DIAGNOSIS — Z95810 Presence of automatic (implantable) cardiac defibrillator: Secondary | ICD-10-CM | POA: Diagnosis not present

## 2018-02-09 NOTE — Progress Notes (Signed)
EPIC Encounter for ICM Monitoring  Patient Name: Kevin [name] is a 72 y.o. male Date: 02/09/2018 Primary Care Physican: [name], MD Primary Cardiologist:[name] Electrophysiologist: [name] Dry Weight:180lbs(at3/25/19officevisit) Bi-V Pacing: 99%        Attempted call to patient and unable to reach  Left message to return call regarding transmission.  Transmission reviewed.    Thoracic impedance continues to be abnormal suggesting fluid accumulation but has improved since 02/03/2018 remote transmission.  Prescribed dosage: Furosemide 40 mg 1 tablet daily  Labs: 02/02/2017 Creatinine 1.34, BUN 20, Potassium 4.1, Sodium 137, EGFR 55.82 12/07/2016 Creatinine 1.13, BUN 13, Potassium 4.6, Sodium 135 09/10/2016 Creatinine 1.07, BUN 25, Potassium 4.4, Sodium 135 09/02/2016 Creatinine 1.15, BUN 23, Potassium 4.3, Sodium 136  06/08/2016 Creatinine 1.14, BUN 18, Potassium 4.1, Sodium 134 04/16/2016 Creatinine 0.96, BUN 12, Potassium 4.4, Sodium 137 08/22/2015 Creatinine 0.98, BUN 16, Potassium 4.4, Sodium 138  Recommendations: NONE - Unable to reach.  Follow-up plan: ICM clinic phone appointment on 02/20/2018.    Copy of ICM check sent to Dr. Rayann Heman and Dr. Sallyanne Kuster.   3 month ICM trend: 02/09/2018    1 Year ICM trend:       Rosalene Billings, RN 02/09/2018 4:45 PM

## 2018-02-09 NOTE — Telephone Encounter (Signed)
Remote ICM transmission received.  Attempted call to patient and left message per DPR to return call regarding transmission.  

## 2018-02-09 NOTE — Telephone Encounter (Signed)
Spoke with pt and reminded pt of remote transmission that is due today. Pt verbalized understanding.   

## 2018-02-20 ENCOUNTER — Telehealth: Payer: Self-pay

## 2018-02-20 ENCOUNTER — Ambulatory Visit (INDEPENDENT_AMBULATORY_CARE_PROVIDER_SITE_OTHER): Payer: Medicare HMO | Admitting: *Deleted

## 2018-02-20 DIAGNOSIS — I428 Other cardiomyopathies: Secondary | ICD-10-CM | POA: Diagnosis not present

## 2018-02-20 DIAGNOSIS — Z95810 Presence of automatic (implantable) cardiac defibrillator: Secondary | ICD-10-CM

## 2018-02-20 DIAGNOSIS — I5042 Chronic combined systolic (congestive) and diastolic (congestive) heart failure: Secondary | ICD-10-CM

## 2018-02-20 NOTE — Telephone Encounter (Signed)
Remote ICM transmission received.  Attempted call to patient and left detailed message per DPR regarding transmission and next ICM scheduled for 03/09/2018.  Advised to return call for any fluid symptoms or questions.

## 2018-02-20 NOTE — Progress Notes (Signed)
EPIC Encounter for ICM Monitoring  Patient Name: Kevin [name] is a 72 y.o. male Date: 02/20/2018 Primary Care Physican: [name], MD Primary Cardiologist:[name] Electrophysiologist: [name] Dry Weight:180lbs(at3/25/19officevisit) Bi-V Pacing: 99.8%      Attempted call to patient and unable to reach.  Left detailed message regarding transmission.  Transmission reviewed.    Thoracic impedance returned to normal since 02/09/2018 remote transmission.   Prescribed dosage: Furosemide 40 mg 1 tablet daily  Labs: 02/02/2017 Creatinine 1.34, BUN 20, Potassium 4.1, Sodium 137, EGFR 55.82 12/07/2016 Creatinine 1.13, BUN 13, Potassium 4.6, Sodium 135 09/10/2016 Creatinine 1.07, BUN 25, Potassium 4.4, Sodium 135 09/02/2016 Creatinine 1.15, BUN 23, Potassium 4.3, Sodium 136  06/08/2016 Creatinine 1.14, BUN 18, Potassium 4.1, Sodium 134 04/16/2016 Creatinine 0.96, BUN 12, Potassium 4.4, Sodium 137 08/22/2015 Creatinine 0.98, BUN 16, Potassium 4.4, Sodium 138  Recommendations: Left voice mail with ICM number and encouraged to call if experiencing any fluid symptoms.  Follow-up plan: ICM clinic phone appointment on 03/09/2018.    Copy of ICM check sent to Dr. Rayann Heman.   3 month ICM trend: 02/20/2018    1 Year ICM trend:       Rosalene Billings, RN 02/20/2018 12:16 PM

## 2018-02-20 NOTE — Progress Notes (Signed)
Remote ICD transmission.   

## 2018-02-21 ENCOUNTER — Other Ambulatory Visit: Payer: Self-pay | Admitting: Endocrinology

## 2018-02-22 ENCOUNTER — Encounter: Payer: Self-pay | Admitting: Cardiology

## 2018-02-22 LAB — CUP PACEART REMOTE DEVICE CHECK
Battery Remaining Longevity: 18 mo
Brady Statistic AP VS Percent: 0.01 %
Brady Statistic AS VS Percent: 0.19 %
Brady Statistic RA Percent Paced: 0.05 %
Brady Statistic RV Percent Paced: 99.78 %
Date Time Interrogation Session: 20190415084223
HighPow Impedance: 77 Ohm
Implantable Lead Implant Date: 20160316
Implantable Lead Location: 753860
Implantable Lead Model: 4598
Implantable Pulse Generator Implant Date: 20160316
Lead Channel Impedance Value: 1045 Ohm
Lead Channel Impedance Value: 494 Ohm
Lead Channel Impedance Value: 513 Ohm
Lead Channel Impedance Value: 627 Ohm
Lead Channel Impedance Value: 874 Ohm
Lead Channel Pacing Threshold Amplitude: 2.75 V
Lead Channel Pacing Threshold Pulse Width: 0.4 ms
Lead Channel Sensing Intrinsic Amplitude: 12 mV
Lead Channel Sensing Intrinsic Amplitude: 4.375 mV
Lead Channel Setting Pacing Amplitude: 2 V
Lead Channel Setting Pacing Amplitude: 2.5 V
Lead Channel Setting Pacing Pulse Width: 0.4 ms
Lead Channel Setting Pacing Pulse Width: 1 ms
MDC IDC LEAD IMPLANT DT: 20130123
MDC IDC LEAD IMPLANT DT: 20160316
MDC IDC LEAD LOCATION: 753858
MDC IDC LEAD LOCATION: 753859
MDC IDC MSMT BATTERY VOLTAGE: 2.92 V
MDC IDC MSMT LEADCHNL LV IMPEDANCE VALUE: 1083 Ohm
MDC IDC MSMT LEADCHNL LV IMPEDANCE VALUE: 1083 Ohm
MDC IDC MSMT LEADCHNL LV IMPEDANCE VALUE: 551 Ohm
MDC IDC MSMT LEADCHNL LV IMPEDANCE VALUE: 551 Ohm
MDC IDC MSMT LEADCHNL LV IMPEDANCE VALUE: 703 Ohm
MDC IDC MSMT LEADCHNL LV IMPEDANCE VALUE: 912 Ohm
MDC IDC MSMT LEADCHNL LV PACING THRESHOLD PULSEWIDTH: 1 ms
MDC IDC MSMT LEADCHNL RA IMPEDANCE VALUE: 418 Ohm
MDC IDC MSMT LEADCHNL RA PACING THRESHOLD AMPLITUDE: 0.5 V
MDC IDC MSMT LEADCHNL RA SENSING INTR AMPL: 4.375 mV
MDC IDC MSMT LEADCHNL RV IMPEDANCE VALUE: 437 Ohm
MDC IDC MSMT LEADCHNL RV PACING THRESHOLD AMPLITUDE: 0.375 V
MDC IDC MSMT LEADCHNL RV PACING THRESHOLD PULSEWIDTH: 0.4 ms
MDC IDC MSMT LEADCHNL RV SENSING INTR AMPL: 12 mV
MDC IDC SET LEADCHNL LV PACING AMPLITUDE: 3.75 V
MDC IDC SET LEADCHNL RV SENSING SENSITIVITY: 0.3 mV
MDC IDC STAT BRADY AP VP PERCENT: 0.04 %
MDC IDC STAT BRADY AS VP PERCENT: 99.77 %

## 2018-02-23 LAB — VIRAL CULTURE VIRC
MICRO NUMBER:: 90370008
SPECIMEN QUALITY: ADEQUATE

## 2018-03-03 ENCOUNTER — Ambulatory Visit (INDEPENDENT_AMBULATORY_CARE_PROVIDER_SITE_OTHER): Payer: Medicare HMO | Admitting: Family Medicine

## 2018-03-03 ENCOUNTER — Encounter: Payer: Self-pay | Admitting: Family Medicine

## 2018-03-03 VITALS — BP 124/64 | HR 78 | Temp 98.7°F | Wt 180.3 lb

## 2018-03-03 DIAGNOSIS — E119 Type 2 diabetes mellitus without complications: Secondary | ICD-10-CM

## 2018-03-03 DIAGNOSIS — I519 Heart disease, unspecified: Secondary | ICD-10-CM

## 2018-03-03 DIAGNOSIS — I10 Essential (primary) hypertension: Secondary | ICD-10-CM

## 2018-03-03 DIAGNOSIS — R42 Dizziness and giddiness: Secondary | ICD-10-CM | POA: Diagnosis not present

## 2018-03-03 LAB — POCT GLYCOSYLATED HEMOGLOBIN (HGB A1C): Hemoglobin A1C: 6.5

## 2018-03-03 NOTE — Progress Notes (Signed)
Subjective:     Patient ID: Kevin [name], male   DOB: [birth year], 72 y.o.   MRN: [id number]  HPI Patient seen for medical follow-up. While golfing yesterday had some lightheadedness which was very transient. Ended up going inside and drinking some fluids and symptoms promptly resolved. He does have history of some systolic dysfunction by previous echocardiogram. He is on low-dose furosemide, Aldactone, Coreg. No recent dyspnea with exertion. No chest pain.  He has type 2 diabetes. Has been followed by endocrinology in the past but not in almost 2 years. He states he had multiple labs done through [facility] last week. Does not monitor blood sugars daily. He takes atorvastatin for hyperlipidemia. He has pacemaker and is getting remote checks with cardiology.  Past Medical History:  Diagnosis Date  . Agent orange exposure    in [place]  . Anemia   . Heart block AV second degree    a. s/p PPM implant with subsequent CRTD upgrade  . High cholesterol   . History of colon polyps   . Hypertension   . LBBB (left bundle branch block)   . Nonischemic cardiomyopathy ([place])    a. MDT CRTD upgrade 2016  . Pacemaker 08/27/2013   Dual-chamber Medtronic Adapta implanted January 2013 for bradycardia with alternating bundle branch block and 2:1 atrioventricular block   . Prostate cancer ([place])    "not been treated yet; on a wait and see" (01/22/2015)  . Type II diabetes mellitus ([place])    Past Surgical History:  Procedure Laterality Date  . BASAL CELL CARCINOMA EXCISION     "back, face, neck"  . BI-VENTRICULAR IMPLANTABLE CARDIOVERTER DEFIBRILLATOR UPGRADE N/A 01/22/2015   MDT CRTD upgrade by Dr Rayann Heman  . CARDIAC CATHETERIZATION  01/27/2012   normal coronaries, dilated LV c/w nonischemic cardiomyopathy  . EXAMINATION UNDER ANESTHESIA  04/06/2012   Procedure: EXAM UNDER ANESTHESIA;  Surgeon: Marcello Moores A. Cornett, MD;  Location: [place];  Service: General;  Laterality: N/A;  . INCISION AND DRAINAGE  PERIRECTAL ABSCESS  ~ 2010; 2015  . INGUINAL HERNIA REPAIR Left 1980  . LEFT HEART CATHETERIZATION WITH CORONARY ANGIOGRAM N/A 01/27/2012   Procedure: LEFT HEART CATHETERIZATION WITH CORONARY ANGIOGRAM;  Surgeon: Troy Sine, MD;  Location: [facility] CATH LAB;  Service: Cardiovascular;  Laterality: N/A;  . NM MYOCAR PERF WALL MOTION  01/21/2012   mod-severe perfusion defect due to infarct/scar w/mild perinfarct ischemia in the apical,basal inferoseptal,basal inferior,mid inferoseptal,midinferior and apical inferior regions  . PERMANENT PACEMAKER INSERTION N/A 12/01/2011   Medtronic implanted by Dr Sallyanne Kuster  . PROSTATE BIOPSY    . PROSTATE BIOPSY    . shrapnel surgery     "in [place]"  . TONSILLECTOMY      reports that he quit smoking about 33 years ago. His smoking use included cigarettes. He has a 20.00 pack-year smoking history. He has never used smokeless tobacco. He reports that he drinks alcohol. He reports that he does not use drugs. family history includes Heart disease in his father; Leukemia in his father; Ovarian cancer in his mother. Allergies  Allergen Reactions  . Sulfa Antibiotics Swelling    Swelling of lips only.     Review of Systems  Constitutional: Negative for fatigue.  Eyes: Negative for visual disturbance.  Respiratory: Negative for cough, chest tightness and shortness of breath.   Cardiovascular: Negative for chest pain, palpitations and leg swelling.  Gastrointestinal: Negative for abdominal pain.  Genitourinary: Negative for dysuria.  Neurological: Negative for syncope, weakness,  light-headedness and headaches.       Objective:   Physical Exam  Constitutional: He appears well-developed and well-nourished.  Neck: Neck supple.  Cardiovascular: Normal rate and regular rhythm.  Pulmonary/Chest: Effort normal and breath sounds normal. No respiratory distress. He has no wheezes. He has no rales.  Musculoskeletal: He exhibits no edema.  Neurological: He is alert.        Assessment:     #1 transient dizziness yesterday. Today standing blood pressure 124/64 symptoms fully resolved.  #2 hypertension stable and at goal  # 3 type 2 diabetes well controlled with A1c today 6.5%  # 4 dyslipidemia    Plan:     -We've asked that he get labs from recent [place] visit -Discussed importance of adequate hydration and avoiding heat midday with activities like golf -continue with same medications.  [name] MD [facility] at [facility]

## 2018-03-03 NOTE — Patient Instructions (Signed)
I would like for you you to schedule a Medicare Annual Wellness Visit (AWV).   This is a yearly appointment with our Health Coach (Susan Hauck, RN) and is designed to develop a personalized prevention plan. This is not a head to toe physical, but rather an opportunity to prevent illness based on your current health and risk factors for disease.   Visits usually last 30-60 minutes and include various screenings for hearing, vision, depression, and dementia, falls, and safety concerns. The visit also includes diet and exercise counseling and information about advance directives.   This is also an opportunity to discuss appropriate health maintenance testing such as mammography, colonoscopy, lung cancer screening, and hepatitis C testing.   The AWV is fully covered by Medicare Part B if:  . You have had Part B for over 12 months, AND . You have not had an AWV in the past 12 months .  Please don't miss out on this opportunity! Set up your appointment today!  

## 2018-03-09 ENCOUNTER — Telehealth: Payer: Self-pay

## 2018-03-09 ENCOUNTER — Ambulatory Visit (INDEPENDENT_AMBULATORY_CARE_PROVIDER_SITE_OTHER): Payer: Medicare HMO

## 2018-03-09 DIAGNOSIS — I5042 Chronic combined systolic (congestive) and diastolic (congestive) heart failure: Secondary | ICD-10-CM

## 2018-03-09 DIAGNOSIS — Z95810 Presence of automatic (implantable) cardiac defibrillator: Secondary | ICD-10-CM | POA: Diagnosis not present

## 2018-03-09 NOTE — Progress Notes (Signed)
EPIC Encounter for ICM Monitoring  Patient Name: Kevin [name] is a 72 y.o. male Date: 03/09/2018 Primary Care Physican: [name], MD Primary Cardiologist:[name] Electrophysiologist: [name] Dry Weight:180lbs(at4/26/19officevisit) Bi-V Pacing: 99.8%       Attempted call to patient and unable to reach.  Left detailed message regarding transmission.  Transmission reviewed.    Thoracic impedance normal.  Prescribed dosage: Furosemide 40 mg 1 tablet daily  Labs: 02/02/2017 Creatinine 1.34, BUN 20, Potassium 4.1, Sodium 137, EGFR 55.82 12/07/2016 Creatinine 1.13, BUN 13, Potassium 4.6, Sodium 135 09/10/2016 Creatinine 1.07, BUN 25, Potassium 4.4, Sodium 135 09/02/2016 Creatinine 1.15, BUN 23, Potassium 4.3, Sodium 136  06/08/2016 Creatinine 1.14, BUN 18, Potassium 4.1, Sodium 134 04/16/2016 Creatinine 0.96, BUN 12, Potassium 4.4, Sodium 137 08/22/2015 Creatinine 0.98, BUN 16, Potassium 4.4, Sodium 138  Recommendations: Left voice mail with ICM number and encouraged to call if experiencing any fluid symptoms.  Follow-up plan: ICM clinic phone appointment on 04/10/2018.    Copy of ICM check sent to Dr. Rayann Heman.   3 month ICM trend: 03/09/2018    1 Year ICM trend:       Rosalene Billings, RN 03/09/2018 9:55 AM

## 2018-03-09 NOTE — Telephone Encounter (Signed)
Remote ICM transmission received.  Attempted call to patient and left detailed message per DPR regarding transmission and next ICM scheduled for 04/10/2018.  Advised to return call for any fluid symptoms or questions.    

## 2018-03-21 ENCOUNTER — Telehealth: Payer: Self-pay | Admitting: Family Medicine

## 2018-03-21 DIAGNOSIS — H9319 Tinnitus, unspecified ear: Secondary | ICD-10-CM

## 2018-03-21 NOTE — Telephone Encounter (Signed)
Copied from [place] [id number]. Topic: Referral - Request >> Mar 21, 2018 12:37 PM Hewitt Shorts wrote: Pt states that Dr. Elease Hashimoto  said if he decided go to the referral that they had talked about to neurology to let him know and the  Pt has decided to go ahead   Best number [phone]

## 2018-03-21 NOTE — Telephone Encounter (Signed)
I cannot remember in what context we discussed referral (ie what is his specific concern or problem for referral?)

## 2018-03-22 NOTE — Telephone Encounter (Signed)
Patient request a referral for tinnitus.

## 2018-03-22 NOTE — Telephone Encounter (Signed)
If having persistent tinnitus, I think ENT referral might be more appropriate to start with.  He may also need audiology assessment.  Since he goes to [place], he might wish to check with them regarding specialist assessment.

## 2018-03-22 NOTE — Telephone Encounter (Signed)
Left message on machine for patient to return our call.  CRM created 

## 2018-03-24 NOTE — Telephone Encounter (Signed)
Patient is aware and referral placed 

## 2018-03-29 DIAGNOSIS — H9313 Tinnitus, bilateral: Secondary | ICD-10-CM | POA: Diagnosis not present

## 2018-03-29 DIAGNOSIS — R42 Dizziness and giddiness: Secondary | ICD-10-CM | POA: Diagnosis not present

## 2018-04-10 ENCOUNTER — Ambulatory Visit (INDEPENDENT_AMBULATORY_CARE_PROVIDER_SITE_OTHER): Payer: Medicare HMO

## 2018-04-10 DIAGNOSIS — Z95810 Presence of automatic (implantable) cardiac defibrillator: Secondary | ICD-10-CM

## 2018-04-10 DIAGNOSIS — I5042 Chronic combined systolic (congestive) and diastolic (congestive) heart failure: Secondary | ICD-10-CM | POA: Diagnosis not present

## 2018-04-10 NOTE — Progress Notes (Signed)
EPIC Encounter for ICM Monitoring  Patient Name: Kevin [name] is a 72 y.o. male Date: 04/10/2018 Primary Care Physican: [name], MD Primary Cardiologist:[name] Electrophysiologist: [name] Dry Weight:Previous weight 180lbs(at4/26/19officevisit) Bi-V Pacing: 99.6%      Attempted call to patient and unable to reach.  Left detailed message, per DPR, regarding transmission.  Transmission reviewed.    Thoracic impedance normal.  Prescribed dosage: Furosemide 40 mg 1 tablet daily  Labs: 02/02/2017 Creatinine 1.34, BUN 20, Potassium 4.1, Sodium 137, EGFR 55.82 12/07/2016 Creatinine 1.13, BUN 13, Potassium 4.6, Sodium 135 09/10/2016 Creatinine 1.07, BUN 25, Potassium 4.4, Sodium 135 09/02/2016 Creatinine 1.15, BUN 23, Potassium 4.3, Sodium 136  06/08/2016 Creatinine 1.14, BUN 18, Potassium 4.1, Sodium 134 04/16/2016 Creatinine 0.96, BUN 12, Potassium 4.4, Sodium 137 08/22/2015 Creatinine 0.98, BUN 16, Potassium 4.4, Sodium 138  Recommendations: Left voice mail with ICM number and encouraged to call if experiencing any fluid symptoms.  Follow-up plan: ICM clinic phone appointment on 05/22/2018.  Office appointment scheduled 06/28/2018 with Dr. Rayann Heman  Copy of ICM check sent to Dr. Rayann Heman.   3 month ICM trend: 04/10/2018    1 Year ICM trend:       Rosalene Billings, RN 04/10/2018 10:54 AM

## 2018-04-18 ENCOUNTER — Other Ambulatory Visit: Payer: Self-pay | Admitting: Family Medicine

## 2018-05-08 ENCOUNTER — Telehealth: Payer: Self-pay

## 2018-05-08 DIAGNOSIS — R42 Dizziness and giddiness: Secondary | ICD-10-CM

## 2018-05-08 DIAGNOSIS — G3184 Mild cognitive impairment, so stated: Secondary | ICD-10-CM

## 2018-05-08 NOTE — Telephone Encounter (Signed)
Ok to refer.

## 2018-05-08 NOTE — Telephone Encounter (Signed)
Copied from [place] [id number]. Topic: Referral - Request >> May 08, 2018  1:51 PM Synthia Innocent wrote: Reason for CRM: Requesting referral to Neurologist for dizzy and memory loss, has seen provider for this.

## 2018-05-09 NOTE — Telephone Encounter (Signed)
Referral placed.

## 2018-05-10 ENCOUNTER — Encounter: Payer: Self-pay | Admitting: Neurology

## 2018-05-22 ENCOUNTER — Ambulatory Visit (INDEPENDENT_AMBULATORY_CARE_PROVIDER_SITE_OTHER): Payer: Medicare HMO | Admitting: *Deleted

## 2018-05-22 ENCOUNTER — Telehealth: Payer: Self-pay

## 2018-05-22 ENCOUNTER — Ambulatory Visit (INDEPENDENT_AMBULATORY_CARE_PROVIDER_SITE_OTHER): Payer: Medicare HMO

## 2018-05-22 DIAGNOSIS — Z95810 Presence of automatic (implantable) cardiac defibrillator: Secondary | ICD-10-CM

## 2018-05-22 DIAGNOSIS — I5042 Chronic combined systolic (congestive) and diastolic (congestive) heart failure: Secondary | ICD-10-CM

## 2018-05-22 DIAGNOSIS — I428 Other cardiomyopathies: Secondary | ICD-10-CM | POA: Diagnosis not present

## 2018-05-22 NOTE — Progress Notes (Signed)
Remote ICD transmission.   

## 2018-05-22 NOTE — Progress Notes (Signed)
EPIC Encounter for ICM Monitoring  Patient Name: Kevin [name] is a 72 y.o. male Date: 05/22/2018 Primary Care Physican: [name], MD Primary Cardiologist:[name] Electrophysiologist: [name] Dry Weight:Previous weight 180lbs(at4/26/19officevisit) Bi-V Pacing: 99.9%      Attempted call to patient and unable to reach.  Left message to return call regarding transmission.  Transmission reviewed. .   Thoracic impedance abnormal suggesting fluid accumulation starting 05/03/2018.  Prescribed dosage: Furosemide 40 mg 1 tablet daily  Labs: 02/02/2017 Creatinine 1.34, BUN 20, Potassium 4.1, Sodium 137, EGFR 55.82 12/07/2016 Creatinine 1.13, BUN 13, Potassium 4.6, Sodium 135 09/10/2016 Creatinine 1.07, BUN 25, Potassium 4.4, Sodium 135 09/02/2016 Creatinine 1.15, BUN 23, Potassium 4.3, Sodium 136  06/08/2016 Creatinine 1.14, BUN 18, Potassium 4.1, Sodium 134 04/16/2016 Creatinine 0.96, BUN 12, Potassium 4.4, Sodium 137 08/22/2015 Creatinine 0.98, BUN 16, Potassium 4.4, Sodium 138  Recommendations: NONE - Unable to reach.  Follow-up plan: ICM clinic phone appointment on 05/29/2018 to recheck fluid levels.    Office appointment scheduled 06/28/2018 with Dr. Rayann Heman  Copy of ICM check sent to Dr. Rayann Heman and Dr Sallyanne Kuster for review.   3 month ICM trend: 05/22/2018    1 Year ICM trend:       Rosalene Billings, RN 05/22/2018 2:27 PM

## 2018-05-22 NOTE — Telephone Encounter (Signed)
Remote ICM transmission received.  Attempted call to patient and left detailed message, per DPR, to return call regarding transmission.    

## 2018-05-23 LAB — CUP PACEART REMOTE DEVICE CHECK
Battery Remaining Longevity: 19 mo
Battery Voltage: 2.92 V
Brady Statistic AP VP Percent: 0.04 %
Brady Statistic AS VP Percent: 99.88 %
Brady Statistic AS VS Percent: 0.08 %
Brady Statistic RV Percent Paced: 99.9 %
Date Time Interrogation Session: 20190715062823
HIGH POWER IMPEDANCE MEASURED VALUE: 72 Ohm
Implantable Lead Implant Date: 20160316
Implantable Lead Location: 753860
Implantable Lead Model: 4598
Implantable Pulse Generator Implant Date: 20160316
Lead Channel Impedance Value: 1045 Ohm
Lead Channel Impedance Value: 1045 Ohm
Lead Channel Impedance Value: 399 Ohm
Lead Channel Impedance Value: 475 Ohm
Lead Channel Impedance Value: 589 Ohm
Lead Channel Impedance Value: 684 Ohm
Lead Channel Impedance Value: 760 Ohm
Lead Channel Impedance Value: 988 Ohm
Lead Channel Pacing Threshold Amplitude: 0.5 V
Lead Channel Pacing Threshold Amplitude: 3 V
Lead Channel Pacing Threshold Pulse Width: 1 ms
Lead Channel Sensing Intrinsic Amplitude: 3.75 mV
Lead Channel Setting Pacing Amplitude: 2 V
Lead Channel Setting Pacing Amplitude: 2.5 V
Lead Channel Setting Sensing Sensitivity: 0.3 mV
MDC IDC LEAD IMPLANT DT: 20130123
MDC IDC LEAD IMPLANT DT: 20160316
MDC IDC LEAD LOCATION: 753858
MDC IDC LEAD LOCATION: 753859
MDC IDC MSMT LEADCHNL LV IMPEDANCE VALUE: 418 Ohm
MDC IDC MSMT LEADCHNL LV IMPEDANCE VALUE: 494 Ohm
MDC IDC MSMT LEADCHNL LV IMPEDANCE VALUE: 494 Ohm
MDC IDC MSMT LEADCHNL LV IMPEDANCE VALUE: 779 Ohm
MDC IDC MSMT LEADCHNL RA IMPEDANCE VALUE: 361 Ohm
MDC IDC MSMT LEADCHNL RA PACING THRESHOLD PULSEWIDTH: 0.4 ms
MDC IDC MSMT LEADCHNL RA SENSING INTR AMPL: 3.75 mV
MDC IDC MSMT LEADCHNL RV PACING THRESHOLD AMPLITUDE: 0.5 V
MDC IDC MSMT LEADCHNL RV PACING THRESHOLD PULSEWIDTH: 0.4 ms
MDC IDC MSMT LEADCHNL RV SENSING INTR AMPL: 12 mV
MDC IDC MSMT LEADCHNL RV SENSING INTR AMPL: 12 mV
MDC IDC SET LEADCHNL LV PACING AMPLITUDE: 3.5 V
MDC IDC SET LEADCHNL LV PACING PULSEWIDTH: 1 ms
MDC IDC SET LEADCHNL RV PACING PULSEWIDTH: 0.4 ms
MDC IDC STAT BRADY AP VS PERCENT: 0.01 %
MDC IDC STAT BRADY RA PERCENT PACED: 0.05 %

## 2018-05-24 ENCOUNTER — Encounter: Payer: Self-pay | Admitting: Cardiology

## 2018-05-29 ENCOUNTER — Ambulatory Visit (INDEPENDENT_AMBULATORY_CARE_PROVIDER_SITE_OTHER): Payer: Medicare HMO

## 2018-05-29 ENCOUNTER — Telehealth: Payer: Self-pay

## 2018-05-29 DIAGNOSIS — Z95810 Presence of automatic (implantable) cardiac defibrillator: Secondary | ICD-10-CM

## 2018-05-29 DIAGNOSIS — I5042 Chronic combined systolic (congestive) and diastolic (congestive) heart failure: Secondary | ICD-10-CM

## 2018-05-29 NOTE — Telephone Encounter (Signed)
Remote ICM transmission received.  Attempted call to patient and left message, per DPR, to return call regarding transmission.

## 2018-05-29 NOTE — Progress Notes (Signed)
EPIC Encounter for ICM Monitoring  Patient Name: Kevin [name] is a 72 y.o. male Date: 05/29/2018 Primary Care Physican: [name], MD Primary Cardiologist:[name] Electrophysiologist: [name] Dry Weight:unknown Bi-V Pacing: 99.9%       Attempted call to patient and unable to reach.  Left message to return call regarding transmission.  Transmission reviewed.    Thoracic impedance abnormal suggesting fluid accumulation starting 05/03/2018.  Prescribed dosage: Furosemide 40 mg 1 tablet daily  Labs: 02/02/2017 Creatinine 1.34, BUN 20, Potassium 4.1, Sodium 137, EGFR 55.82 12/07/2016 Creatinine 1.13, BUN 13, Potassium 4.6, Sodium 135 09/10/2016 Creatinine 1.07, BUN 25, Potassium 4.4, Sodium 135 09/02/2016 Creatinine 1.15, BUN 23, Potassium 4.3, Sodium 136  06/08/2016 Creatinine 1.14, BUN 18, Potassium 4.1, Sodium 134 04/16/2016 Creatinine 0.96, BUN 12, Potassium 4.4, Sodium 137 08/22/2015 Creatinine 0.98, BUN 16, Potassium 4.4, Sodium 138  Recommendations:  NONE - Unable to reach.  Follow-up plan: ICM clinic phone appointment on 06/06/2018 to recheck fluid levels.   Office appointment scheduled 06/28/2018 with Dr.[name]  Copy of ICM check sent to Dr. Rayann Heman and Dr Sallyanne Kuster for review.    3 month ICM trend: 05/29/2018    1 Year ICM trend:       Rosalene Billings, RN 05/29/2018 9:52 AM

## 2018-05-30 ENCOUNTER — Other Ambulatory Visit: Payer: Self-pay | Admitting: Endocrinology

## 2018-05-30 NOTE — Telephone Encounter (Signed)
Has not been seen since March 2018. Refill or deny?

## 2018-05-30 NOTE — Telephone Encounter (Signed)
Do not refill.  

## 2018-05-31 ENCOUNTER — Other Ambulatory Visit: Payer: Self-pay

## 2018-05-31 ENCOUNTER — Telehealth: Payer: Self-pay | Admitting: Emergency Medicine

## 2018-05-31 MED ORDER — SITAGLIP PHOS-METFORMIN HCL ER 50-1000 MG PO TB24: 2.0000 | ORAL_TABLET | Freq: Every day | ORAL | 0 refills | Status: DC

## 2018-05-31 NOTE — Telephone Encounter (Signed)
90 tablets sent to pharmacy to get pt through until next appt.

## 2018-05-31 NOTE — Telephone Encounter (Signed)
Pt called and made an appt. He wants to know if he can get a refill on his JANUMET XR 50-1000 MG TB24. Pharmacy is [facility]. Thanks.

## 2018-06-06 ENCOUNTER — Telehealth: Payer: Self-pay

## 2018-06-06 NOTE — Telephone Encounter (Signed)
Spoke with pt and reminded pt of remote transmission that is due today. Pt verbalized understanding.  He will send a transmission on Thursday.

## 2018-06-15 NOTE — Progress Notes (Signed)
No ICM remote transmission received for 06/06/2018 and next ICM transmission scheduled for 07/31/2018.  Office defib check with Dr Rayann Heman 06/28/2018.

## 2018-06-28 ENCOUNTER — Encounter: Payer: Self-pay | Admitting: Internal Medicine

## 2018-06-28 ENCOUNTER — Ambulatory Visit (INDEPENDENT_AMBULATORY_CARE_PROVIDER_SITE_OTHER): Payer: Medicare HMO | Admitting: Internal Medicine

## 2018-06-28 VITALS — BP 112/64 | HR 76 | Ht 75.0 in | Wt 185.4 lb

## 2018-06-28 DIAGNOSIS — I5042 Chronic combined systolic (congestive) and diastolic (congestive) heart failure: Secondary | ICD-10-CM | POA: Diagnosis not present

## 2018-06-28 DIAGNOSIS — Z95810 Presence of automatic (implantable) cardiac defibrillator: Secondary | ICD-10-CM | POA: Diagnosis not present

## 2018-06-28 DIAGNOSIS — I428 Other cardiomyopathies: Secondary | ICD-10-CM

## 2018-06-28 LAB — CUP PACEART INCLINIC DEVICE CHECK
Battery Voltage: 2.91 V
Brady Statistic AS VS Percent: 0.63 %
Date Time Interrogation Session: 20190821170447
HIGH POWER IMPEDANCE MEASURED VALUE: 77 Ohm
Implantable Lead Implant Date: 20160316
Implantable Lead Location: 753858
Implantable Lead Location: 753860
Implantable Lead Model: 4598
Implantable Lead Model: 5076
Implantable Pulse Generator Implant Date: 20160316
Lead Channel Impedance Value: 1045 Ohm
Lead Channel Impedance Value: 1083 Ohm
Lead Channel Impedance Value: 437 Ohm
Lead Channel Impedance Value: 798 Ohm
Lead Channel Impedance Value: 836 Ohm
Lead Channel Pacing Threshold Amplitude: 0.5 V
Lead Channel Pacing Threshold Amplitude: 0.5 V
Lead Channel Pacing Threshold Pulse Width: 0.4 ms
Lead Channel Pacing Threshold Pulse Width: 1 ms
Lead Channel Sensing Intrinsic Amplitude: 12 mV
Lead Channel Setting Pacing Amplitude: 2.5 V
Lead Channel Setting Pacing Amplitude: 3.5 V
Lead Channel Setting Pacing Pulse Width: 0.4 ms
Lead Channel Setting Pacing Pulse Width: 1 ms
Lead Channel Setting Sensing Sensitivity: 0.3 mV
MDC IDC LEAD IMPLANT DT: 20130123
MDC IDC LEAD IMPLANT DT: 20160316
MDC IDC LEAD LOCATION: 753859
MDC IDC MSMT BATTERY REMAINING LONGEVITY: 18 mo
MDC IDC MSMT LEADCHNL LV IMPEDANCE VALUE: 1083 Ohm
MDC IDC MSMT LEADCHNL LV IMPEDANCE VALUE: 437 Ohm
MDC IDC MSMT LEADCHNL LV IMPEDANCE VALUE: 513 Ohm
MDC IDC MSMT LEADCHNL LV IMPEDANCE VALUE: 551 Ohm
MDC IDC MSMT LEADCHNL LV IMPEDANCE VALUE: 684 Ohm
MDC IDC MSMT LEADCHNL LV IMPEDANCE VALUE: 703 Ohm
MDC IDC MSMT LEADCHNL LV PACING THRESHOLD AMPLITUDE: 2.75 V
MDC IDC MSMT LEADCHNL RA IMPEDANCE VALUE: 418 Ohm
MDC IDC MSMT LEADCHNL RA SENSING INTR AMPL: 2.5 mV
MDC IDC MSMT LEADCHNL RA SENSING INTR AMPL: 3.5 mV
MDC IDC MSMT LEADCHNL RV IMPEDANCE VALUE: 494 Ohm
MDC IDC MSMT LEADCHNL RV PACING THRESHOLD PULSEWIDTH: 0.4 ms
MDC IDC MSMT LEADCHNL RV SENSING INTR AMPL: 12 mV
MDC IDC SET LEADCHNL RA PACING AMPLITUDE: 2 V
MDC IDC STAT BRADY AP VP PERCENT: 0.04 %
MDC IDC STAT BRADY AP VS PERCENT: 0.01 %
MDC IDC STAT BRADY AS VP PERCENT: 99.32 %
MDC IDC STAT BRADY RA PERCENT PACED: 0.05 %
MDC IDC STAT BRADY RV PERCENT PACED: 99.31 %

## 2018-06-28 NOTE — Patient Instructions (Signed)
Medication Instructions:  Your physician recommends that you continue on your current medications as directed. Please refer to the Current Medication list given to you today.  Labwork: None ordered.  Testing/Procedures: None ordered.  Follow-Up: Your physician wants you to follow-up in: 12 months with Chanetta Marshall, NP. You will receive a reminder letter in the mail two months in advance. If you don't receive a letter, please call our office to schedule the follow-up appointment.  Remote monitoring is used to monitor your Pacemaker of ICD from home. This monitoring reduces the number of office visits required to check your device to one time per year. It allows [place] to keep an eye on the functioning of your device to ensure it is working properly. You are scheduled for a device check from home on 08/21/2018. You may send your transmission at any time that day. If you have a wireless device, the transmission will be sent automatically. After your physician reviews your transmission, you will receive a postcard with your next transmission date.    Any Other Special Instructions Will Be Listed Below (If Applicable).     If you need a refill on your cardiac medications before your next appointment, please call your pharmacy.

## 2018-06-28 NOTE — Progress Notes (Signed)
PCP: [name], MD Primary Cardiologist: Dr Sallyanne Kuster Primary EP: Dr Kevin [name] is a 72 y.o. male who presents today for routine electrophysiology followup.  Since last being seen in our clinic, the patient reports doing reasonably well.  + occasional postural dizziness.  Today, he denies symptoms of palpitations, chest pain, shortness of breath,  lower extremity edema,  presyncope, syncope, or ICD shocks.  The patient is otherwise without complaint today.   Past Medical History:  Diagnosis Date  . Agent orange exposure    in [place]  . Anemia   . Heart block AV second degree    a. s/p PPM implant with subsequent CRTD upgrade  . High cholesterol   . History of colon polyps   . Hypertension   . LBBB (left bundle branch block)   . Nonischemic cardiomyopathy ([place])    a. MDT CRTD upgrade 2016  . Pacemaker 08/27/2013   Dual-chamber Medtronic Adapta implanted January 2013 for bradycardia with alternating bundle branch block and 2:1 atrioventricular block   . Prostate cancer ([place])    "not been treated yet; on a wait and see" (01/22/2015)  . Type II diabetes mellitus ([place])    Past Surgical History:  Procedure Laterality Date  . BASAL CELL CARCINOMA EXCISION     "back, face, neck"  . BI-VENTRICULAR IMPLANTABLE CARDIOVERTER DEFIBRILLATOR UPGRADE N/A 01/22/2015   MDT CRTD upgrade by Dr Rayann Heman  . CARDIAC CATHETERIZATION  01/27/2012   normal coronaries, dilated LV c/w nonischemic cardiomyopathy  . EXAMINATION UNDER ANESTHESIA  04/06/2012   Procedure: EXAM UNDER ANESTHESIA;  Surgeon: Marcello Moores A. Cornett, MD;  Location: [facility];  Service: General;  Laterality: N/A;  . INCISION AND DRAINAGE PERIRECTAL ABSCESS  ~ 2010; 2015  . INGUINAL HERNIA REPAIR Left 1980  . LEFT HEART CATHETERIZATION WITH CORONARY ANGIOGRAM N/A 01/27/2012   Procedure: LEFT HEART CATHETERIZATION WITH CORONARY ANGIOGRAM;  Surgeon: Troy Sine, MD;  [facility] C[facility];  Service: Cardiovascular;   Laterality: N/A;  . NM MYOCAR PERF WALL MOTION  01/21/2012   mod-severe perfusion defect due to infarct/scar w/mild perinfarct ischemia in the apical,basal inferoseptal,basal inferior,mid inferoseptal,midinferior and apical inferior regions  . PERMANENT PACEMAKER INSERTION N/A 12/01/2011   Medtronic implanted by Dr Sallyanne Kuster  . PROSTATE BIOPSY    . PROSTATE BIOPSY    . shrapnel surgery     "in [place]"  . TONSILLECTOMY      ROS- all systems are reviewed and negative except as per HPI above  Current Outpatient Medications  Medication Sig Dispense Refill  . ACCU-CHEK COMPACT PLUS test strip use 1 TEST STRIP once daily as directed 102 each 5  . aspirin 81 MG tablet Take 81 mg by mouth daily.      Marland Kitchen atorvastatin (LIPITOR) 80 MG tablet Take 40 mg by mouth daily at 6 PM.    . buPROPion (WELLBUTRIN XL) 150 MG 24 hr tablet TAKE 1 TABLET BY MOUTH EVERY DAY 90 tablet 1  . carvedilol (COREG) 12.5 MG tablet Take 6.25 mg by mouth 2 (two) times daily with a meal.    . clobetasol ointment (TEMOVATE) 6.04 % Apply 1 application topically daily as needed (itching).     . furosemide (LASIX) 40 MG tablet TAKE 1 TABLET BY MOUTH DAILY WITH BREAKFAST (Patient taking differently: TAKE 1/2 TABLET BY MOUTH DAILY WITH BREAKFAST) 90 tablet 3  . latanoprost (XALATAN) 0.005 % ophthalmic solution Place 1 drop into the right eye daily.     Marland Kitchen  LORazepam (ATIVAN) 0.5 MG tablet Take 0.5 mg by mouth every 8 (eight) hours. As needed    . SitaGLIPtin-MetFORMIN HCl (JANUMET XR) 50-1000 MG TB24 Take 0.5 tablets by mouth 2 (two) times daily.    Marland Kitchen spironolactone (ALDACTONE) 25 MG tablet TAKE 1 TABLET BY MOUTH EVERY DAY (Patient taking differently: TAKE 1/2 TABLET BY MOUTH EVERY DAY) 90 tablet 3  . TURMERIC PO Take 1 tablet by mouth daily.    Marland Kitchen VIAGRA 100 MG tablet TAKE ONE TABLET BY MOUTH AS NEEDED FOR ERECTILE DYSFUNCTION. 10 tablet 3   No current facility-administered medications for this visit.     Physical Exam: Vitals:    06/28/18 1447  BP: 112/64  Pulse: 76  SpO2: 93%  Weight: 185 lb 6.4 oz (84.1 kg)  Height: 6\' 3"  (1.905 m)    GEN- The patient is well appearing, alert and oriented x 3 today.   Head- normocephalic, atraumatic Eyes-  Sclera clear, conjunctiva pink Ears- hearing intact Oropharynx- clear Lungs- Clear to ausculation bilaterally, normal work of breathing Chest- ICD pocket is well healed Heart- Regular rate and rhythm, no murmurs, rubs or gallops, PMI not laterally displaced GI- soft, NT, ND, + BS Extremities- no clubbing, cyanosis, or edema  ICD interrogation- reviewed in detail today,  See PACEART report  ekg tracing ordered today is personally reviewed and shows sinus with Biv pacing, QRS 162 msec  Wt Readings from Last 3 Encounters:  06/28/18 185 lb 6.4 oz (84.1 kg)  03/03/18 180 lb 4.8 oz (81.8 kg)  01/30/18 180 lb 1.6 oz (81.7 kg)    Assessment and Plan:  1.  Chronic systolic dysfunction/ LBBB euvolemic today Echo 05/05/16 reveals EF 35-40% Stable on an appropriate medical regimen Normal ICD function See [facility] report No changes today (we have tried different LV vectors previously which he did not tolerate due to diaphragmatic stim) followed in ICM device clinic Could consider entresto,  I will defer to Dr Sallyanne Kuster  2. Sick sinus/ mobitz II AV block Normal BiV ICD as above  [facility] Return to see EP NP in a year  [name] MD, [facility] 06/28/2018 3:04 PM'

## 2018-06-30 NOTE — Addendum Note (Signed)
Addended by: Rose Phi on: 06/30/2018 11:51 AM   Modules accepted: Orders

## 2018-07-12 ENCOUNTER — Other Ambulatory Visit: Payer: Self-pay | Admitting: Endocrinology

## 2018-07-14 ENCOUNTER — Encounter: Payer: Self-pay | Admitting: Neurology

## 2018-07-14 ENCOUNTER — Ambulatory Visit: Payer: Medicare HMO | Admitting: Neurology

## 2018-07-14 VITALS — BP 120/76 | HR 90 | Ht 75.0 in | Wt 183.0 lb

## 2018-07-14 DIAGNOSIS — G3184 Mild cognitive impairment, so stated: Secondary | ICD-10-CM

## 2018-07-14 DIAGNOSIS — R413 Other amnesia: Secondary | ICD-10-CM | POA: Diagnosis not present

## 2018-07-14 DIAGNOSIS — H8110 Benign paroxysmal vertigo, unspecified ear: Secondary | ICD-10-CM

## 2018-07-14 MED ORDER — DONEPEZIL HCL 10 MG PO TABS: ORAL_TABLET | ORAL | 11 refills | Status: DC

## 2018-07-14 NOTE — Patient Instructions (Signed)
1. Schedule head CT without contrast 2. Start Donepezil 10mg : Take 1/2 tablet daily for 2 weeks, then increase to 1 tablet daily 3. Follow-up in 6 months or so, call for any changes  FALL PRECAUTIONS: Be cautious when walking. Scan the area for obstacles that may increase the risk of trips and falls. When getting up in the mornings, sit up at the edge of the bed for a few minutes before getting out of bed. Consider elevating the bed at the head end to avoid drop of blood pressure when getting up. Walk always in a well-lit room (use night lights in the walls). Avoid area rugs or power cords from appliances in the middle of the walkways. Use a walker or a cane if necessary and consider physical therapy for balance exercise. Get your eyesight checked regularly.  FINANCIAL OVERSIGHT: Supervision, especially oversight when making financial decisions or transactions is also recommended.  HOME SAFETY: Consider the safety of the kitchen when operating appliances like stoves, microwave oven, and blender. Consider having supervision and share cooking responsibilities until no longer able to participate in those. Accidents with firearms and other hazards in the house should be identified and addressed as well.  DRIVING: Regarding driving, in patients with progressive memory problems, driving will be impaired. We advise to have someone else do the driving if trouble finding directions or if minor accidents are reported. Independent driving assessment is available to determine safety of driving.  ABILITY TO BE LEFT ALONE: If patient is unable to contact 911 operator, consider using LifeLine, or when the need is there, arrange for someone to stay with patients. Smoking is a fire hazard, consider supervision or cessation. Risk of wandering should be assessed by caregiver and if detected at any point, supervision and safe proof recommendations should be instituted.  MEDICATION SUPERVISION: Inability to self-administer  medication needs to be constantly addressed. Implement a mechanism to ensure safe administration of the medications.  RECOMMENDATIONS FOR ALL PATIENTS WITH MEMORY PROBLEMS: 1. Continue to exercise (Recommend 30 minutes of walking everyday, or 3 hours every week) 2. Increase social interactions - continue going to [place] and enjoy social gatherings with friends and family 3. Eat healthy, avoid fried foods and eat more fruits and vegetables 4. Maintain adequate blood pressure, blood sugar, and blood cholesterol level. Reducing the risk of stroke and cardiovascular disease also helps promoting better memory. 5. Avoid stressful situations. Live a simple life and avoid aggravations. Organize your time and prepare for the next day in anticipation. 6. Sleep well, avoid any interruptions of sleep and avoid any distractions in the bedroom that may interfere with adequate sleep quality 7. Avoid sugar, avoid sweets as there is a strong link between excessive sugar intake, diabetes, and cognitive impairment We discussed the Mediterranean diet, which has been shown to help patients reduce the risk of progressive memory disorders and reduces cardiovascular risk. This includes eating fish, eat fruits and green leafy vegetables, nuts like almonds and hazelnuts, walnuts, and also use olive oil. Avoid fast foods and fried foods as much as possible. Avoid sweets and sugar as sugar use has been linked to worsening of memory function.  There is always a concern of gradual progression of memory problems. If this is the case, then we may need to adjust level of care according to patient needs. Support, both to the patient and caregiver, should then be put into place.

## 2018-07-14 NOTE — Progress Notes (Signed)
NEUROLOGY CONSULTATION NOTE  Kevin [name] MRN: [id number] DOB: [birth year]  Referring provider: Dr. Carolann Littler Primary care provider: Dr. Carolann Littler  Reason for consult:  Lightheadedness, mild cognitive impairment  Dear Dr Elease Hashimoto:  Thank you for your kind referral of Kevin [name] for consultation of the above symptoms. Although his history is well known to you, please allow me to reiterate it for the purpose of our medical record. The patient was accompanied to the clinic by his wife who also provides collateral information. Records and images were personally reviewed where available.  HISTORY OF PRESENT ILLNESS: This is a pleasant 72 year old right-handed man with a history of hypertension, hyperlipidemia, diabetes, prostrate cancer, NICM, second degree AV block s/p PPM, presenting for evaluation of memory changes and dizziness. He and his wife disagree several times during the visit about what each other says. His wife reports dizziness started 2 years ago and that he complains about it daily to her. He states he does not have it everyday and does not think it's been going on for 2 years. It only occurs when standing, he is fine sitting/supine. Dizziness is described as lightheadedness, he used to play golf but stopped playing because he would transiently feel dizzy. He then reports that he tried to play golf 4-5 weeks ago and did not feel dizzy at all. His wife has checked his BP and glucose during these times and found them normal. He feels that when he takes his glasses off, the dizziness stops. He had a different type of dizziness the other night when he bent down to brush his teeth and started having a spinning sensation like he would fall. His wife helped him to sit on the commode and her threw up 3 times. Vertigo lasted 5 minutes. No associated focal symptoms. He fell yesterday but states this is not a regular thing. He denies any headaches, diplopia,  dysarthria/dysphagia, neck/back pain, focal numbness/tingling/weakness, bowel/bladder dysfunction, anosmia, or tremors. He has tinnitus and has seen ENT, he reports that chelated magnesium and turmeric help.  When asked about memory, he reports he gets frustrated trying to remember names, then it eventually comes to him. His wife started noticing changes 2 years ago where he would repeat himself. She will be going back to work and the other day he asked her at least 5 times if she was working that day. He misplaces things frequently, having a bill or his checkbook and unable to recall where he lay it down. He volunteers at [employer] and could not remember someone's name yesterday. He continues to drive and denies getting lost, but his wife shakes her head and states he would be driving and forget how to get to his destination, which frustrates him since he has lived in [place] for a long time. They manage bills together, his wife denies any difficulties. His wife fixes his pillbox, he takes his medications overall fine but sometimes puts them in a packet and takes it later on. He is independent with dressing and bathing. His wife has noticed increased irritability, they have been married for 5 years. No paranoia or hallucinations. No wandering behavior, he sleeps well with melatonin. He takes prn Ativan only when "really frustrated." He is a retired Cytogeneticist. No family history of dementia. He was knocked down by shrapnel in [place]. He very seldom drinks alcohol. MMSE 27/30 in October 2018.  Laboratory Data: Lab Results  Component Value Date   TSH 0.56 09/02/2017   Lab  Results  Component Value Date   [id number] 098 09/02/2017   Lab Results  Component Value Date   HGBA1C 6.5 03/03/2018    PAST MEDICAL HISTORY: Past Medical History:  Diagnosis Date  . Agent orange exposure    in [place]  . Anemia   . Heart block AV second degree    a. s/p PPM implant with subsequent CRTD upgrade  . High  cholesterol   . History of colon polyps   . Hypertension   . LBBB (left bundle branch block)   . Nonischemic cardiomyopathy ([place])    a. MDT CRTD upgrade 2016  . Pacemaker 08/27/2013   Dual-chamber Medtronic Adapta implanted January 2013 for bradycardia with alternating bundle branch block and 2:1 atrioventricular block   . Prostate cancer ([place])    "not been treated yet; on a wait and see" (01/22/2015)  . Type II diabetes mellitus ([place])     PAST SURGICAL HISTORY: Past Surgical History:  Procedure Laterality Date  . BASAL CELL CARCINOMA EXCISION     "back, face, neck"  . BI-VENTRICULAR IMPLANTABLE CARDIOVERTER DEFIBRILLATOR UPGRADE N/A 01/22/2015   MDT CRTD upgrade by Dr Rayann Heman  . CARDIAC CATHETERIZATION  01/27/2012   normal coronaries, dilated LV c/w nonischemic cardiomyopathy  . EXAMINATION UNDER ANESTHESIA  04/06/2012   Procedure: EXAM UNDER ANESTHESIA;  Surgeon: Marcello Moores A. Cornett, MD;  Location: [facility];  Service: General;  Laterality: N/A;  . INCISION AND DRAINAGE PERIRECTAL ABSCESS  ~ 2010; 2015  . INGUINAL HERNIA REPAIR Left 1980  . LEFT HEART CATHETERIZATION WITH CORONARY ANGIOGRAM N/A 01/27/2012   Procedure: LEFT HEART CATHETERIZATION WITH CORONARY ANGIOGRAM;  Surgeon: Troy Sine, MD;  Location: [facility] CATH LAB;  Service: Cardiovascular;  Laterality: N/A;  . NM MYOCAR PERF WALL MOTION  01/21/2012   mod-severe perfusion defect due to infarct/scar w/mild perinfarct ischemia in the apical,basal inferoseptal,basal inferior,mid inferoseptal,midinferior and apical inferior regions  . PERMANENT PACEMAKER INSERTION N/A 12/01/2011   Medtronic implanted by Dr Sallyanne Kuster  . PROSTATE BIOPSY    . PROSTATE BIOPSY    . shrapnel surgery     "in [place]"  . TONSILLECTOMY      MEDICATIONS: Current Outpatient Medications on File Prior to Visit  Medication Sig Dispense Refill  . ACCU-CHEK COMPACT PLUS test strip use 1 TEST STRIP once daily as directed 102 each 5  . aspirin 81 MG tablet Take 81 mg  by mouth daily.      Marland Kitchen atorvastatin (LIPITOR) 80 MG tablet Take 40 mg by mouth daily at 6 PM.    . buPROPion (WELLBUTRIN XL) 150 MG 24 hr tablet TAKE 1 TABLET BY MOUTH EVERY DAY 90 tablet 1  . carvedilol (COREG) 12.5 MG tablet Take 6.25 mg by mouth 2 (two) times daily with a meal.    . clobetasol ointment (TEMOVATE) 1.19 % Apply 1 application topically daily as needed (itching).     . furosemide (LASIX) 40 MG tablet TAKE 1 TABLET BY MOUTH DAILY WITH BREAKFAST (Patient taking differently: TAKE 1/2 TABLET BY MOUTH DAILY WITH BREAKFAST) 90 tablet 3  . JANUMET XR 50-1000 MG TB24 TAKE 2 TABLETS BY MOUTH DAILY 90 tablet 0  . latanoprost (XALATAN) 0.005 % ophthalmic solution Place 1 drop into the right eye daily.     Marland Kitchen LORazepam (ATIVAN) 0.5 MG tablet Take 0.5 mg by mouth every 8 (eight) hours. As needed    . SitaGLIPtin-MetFORMIN HCl (JANUMET XR) 50-1000 MG TB24 Take 0.5 tablets by mouth 2 (two) times daily.    Marland Kitchen  spironolactone (ALDACTONE) 25 MG tablet TAKE 1 TABLET BY MOUTH EVERY DAY (Patient taking differently: TAKE 1/2 TABLET BY MOUTH EVERY DAY) 90 tablet 3  . TURMERIC PO Take 1 tablet by mouth daily.    Marland Kitchen VIAGRA 100 MG tablet TAKE ONE TABLET BY MOUTH AS NEEDED FOR ERECTILE DYSFUNCTION. 10 tablet 3   No current facility-administered medications on file prior to visit.     ALLERGIES: Allergies  Allergen Reactions  . Sulfa Antibiotics Swelling    Swelling of lips only.    FAMILY HISTORY: Family History  Problem Relation Age of Onset  . Ovarian cancer Mother   . Leukemia Father   . Heart disease Father   . Diabetes Neg Hx     SOCIAL HISTORY: Social History   Socioeconomic History  . Marital status: Married    Spouse name: Not on file  . Number of children: 0  . Years of education: Not on file  . Highest education level: Not on file  Occupational History  . Occupation: Surveyor, minerals: [employer]  . Financial resource strain: Not on file  .  Food insecurity:    Worry: Not on file    Inability: Not on file  . Transportation needs:    Medical: Not on file    Non-medical: Not on file  Tobacco Use  . Smoking status: Former Smoker    Packs/day: 1.00    Years: 20.00    Pack years: 20.00    Types: Cigarettes    Last attempt to quit: 04/13/1984    Years since quitting: 34.2  . Smokeless tobacco: Never Used  Substance and Sexual Activity  . Alcohol use: Yes    Comment: 01/22/2015 "maybe 3 beers or glasses of wine /month"  . Drug use: No  . Sexual activity: Yes  Lifestyle  . Physical activity:    Days per week: Not on file    Minutes per session: Not on file  . Stress: Not on file  Relationships  . Social connections:    Talks on phone: Not on file    Gets together: Not on file    Attends religious service: Not on file    Active member of club or organization: Not on file    Attends meetings of clubs or organizations: Not on file    Relationship status: Not on file  . Intimate partner violence:    Fear of current or ex partner: Not on file    Emotionally abused: Not on file    Physically abused: Not on file    Forced sexual activity: Not on file  Other Topics Concern  . Not on file  Social History Narrative   Pt lives in [place] with spouse.  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    REVIEW OF SYSTEMS: Constitutional: No fevers, chills, or sweats, no generalized fatigue, change in appetite Eyes: No visual changes, double vision, eye pain Ear, nose and throat: No hearing loss, ear pain, nasal congestion, sore throat Cardiovascular: No chest pain, palpitations Respiratory:  No shortness of breath at rest or with exertion, wheezes GastrointestinaI: No nausea, vomiting, diarrhea, abdominal pain, fecal incontinence Genitourinary:  No dysuria, urinary retention or frequency Musculoskeletal:  No neck pain, back pain Integumentary: No rash, pruritus, skin lesions Neurological: as  above Psychiatric: No depression, insomnia, anxiety Endocrine: No palpitations, fatigue, diaphoresis, mood swings, change in appetite, change in weight, increased thirst Hematologic/Lymphatic:  No anemia,  purpura, petechiae. Allergic/Immunologic: no itchy/runny eyes, nasal congestion, recent allergic reactions, rashes  PHYSICAL EXAM: Vitals:   07/14/18 1048  BP: 120/76  Pulse: 90  SpO2: 95%   General: No acute distress Head:  Normocephalic/atraumatic Eyes: Fundoscopic exam shows bilateral sharp discs, no vessel changes, exudates, or hemorrhages Neck: supple, no paraspinal tenderness, full range of motion Back: No paraspinal tenderness Heart: regular rate and rhythm Lungs: Clear to auscultation bilaterally. Vascular: No carotid bruits. Skin/Extremities: No rash, no edema Neurological Exam: Mental status: alert and oriented to person, place, and time, no dysarthria or aphasia, Fund of knowledge is appropriate.  Recent and remote memory are impaired.  Attention and concentration are normal.    Able to name objects and repeat phrases.  Montreal Cognitive Assessment  07/14/2018  Visuospatial/ Executive (0/5) 4  Naming (0/3) 3  Attention: Read list of digits (0/2) 2  Attention: Read list of letters (0/1) 1  Attention: Serial 7 subtraction starting at 100 (0/3) 3  Language: Repeat phrase (0/2) 2  Language : Fluency (0/1) 1  Abstraction (0/2) 2  Delayed Recall (0/5) 0  Orientation (0/6) 6  Total 24   Cranial nerves: CN I: not tested CN II: pupils equal, round and reactive to light, visual fields intact, fundi unremarkable. CN III, IV, VI:  full range of motion, no nystagmus, no ptosis CN V: facial sensation intact CN VII: upper and lower face symmetric CN VIII: hearing intact to finger rub CN IX, X: gag intact, uvula midline CN XI: sternocleidomastoid and trapezius muscles intact CN XII: tongue midline Bulk & Tone: normal, no fasciculations. Motor: 5/5 throughout with no  pronator drift. Sensation: intact to light touch, cold, pin on both UE, decreased pin, vibration sense to ankles bilaterally.  No extinction to double simultaneous stimulation.  Romberg test negative Deep Tendon Reflexes: +2 on both UE, +1 both LE, no ankle clonus Plantar responses: downgoing bilaterally Cerebellar: no incoordination on finger to nose testing Gait: narrow-based and steady, able to tandem walk adequately. Tremor: none  IMPRESSION: This is a pleasant 72 year old right-handed man with a history of  hypertension, hyperlipidemia, diabetes, prostrate cancer, NICM, second degree AV block s/p PPM, presenting for evaluation of memory changes and dizziness. His neurological exam today shows mild length-dependent neuropathy, MOCA score 24/30. 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 Mild cognitive impairment means there are serious cognitive problems by report and testing but the patient is functioning normally. Around 50% of MCI patients progress to dementia (functional impairment) over 5 years. Dementia implies that ADLs are currently compromised. Head CT without contrast will be ordered to assess for underlying structural abnormality. He is agreeable to start Donepezil 5mg  daily for 2 weeks, then increase to 10mg  daily. Side effects and expectations from the medication were discussed. Etiology of lightheadedness unclear, orthostatic vital signs were not done today, would check with PCP. We discussed that neuropathy can also cause a sense of imbalance. He had an episode of positional vertigo the other night, if this recurs, he will be referred for vestibular therapy. We discussed the importance of control of vascular risk factors, physical exercise, and brain stimulation exercises for brain health. He will follow-up in 6 months and knows to call for any changes.   Thank you for allowing me to participate in the care of  this patient. Please do not hesitate to call for any questions or concerns.   Ellouise Newer, M.D.  CC: Dr.  Burchette

## 2018-07-23 NOTE — Progress Notes (Signed)
Patient ID: Kevin [name], male   DOB: [birth year], 72 y.o.   MRN: [id number]    Reason for Appointment:  Followup for Type 2 Diabetes   History of Present Illness:          Diagnosis: Type 2 diabetes mellitus, date of diagnosis: 2001        Past history: His previous records are not available but initially his symptoms included weight loss He thinks he has been on metformin for several years and more recently has been taking 1000 mg twice a day [facility] His A1c was 6.9 in 2013 but a year later was 8.1 He had been on glipizide 5 mg twice a day in addition to his metformin 1 g twice a day   Prior to his consultation when his A1c was 10% and he was given Januvia in addition  Recent history:   Oral hypoglycemic drugs the patient is taking XFG:[id number] XR 1 tablet daily  His A1c is 6.3  He has not been seen in follow-up for over 2 years  Current management, blood sugar patterns and problems identified:   Although he was on glipizide and full doses of Janumet on the last visit in 2018 he is only taking Janumet 1 tablet daily  He does not know what dose he is taking as he gets this from the [facility]  He forgot to bring his meter  Although he thinks his blood sugars are about 120+ not clear how often he checks them and whether he checks them after meals because of his difficulty with memory  His weight is overall about the same  However he states that he is more active with walking a lot during the day and also going to the gym at times  Also not clear what brand of meter he is using   Hypoglycemia: None recently        Side effects from medications have been: None  Glucose monitoring:  done 1-2 times a day         Glucometer:  Accu-Chek?     Blood Glucose readings  by recall: Blood sugars mostly in 120s  Glycemic control:    Lab Results  Component Value Date   HGBA1C 6.3 (A) 07/24/2018   HGBA1C 6.5 03/03/2018   HGBA1C 6.6 (H) 12/07/2016   Lab Results  Component Value Date   MICROALBUR 0.6 12/07/2016   LDLCALC 56 12/07/2016   CREATININE 1.34 02/02/2017     Self-care: The diet that the patient has been following is: tries to limit portions, may get fried food when eating out, some fast food    Meals: 3 meals per day.  breakfast is at 7-8 am and is usually eating a biscuit or special K. cereal Usually eating baked chicken or chicken salad sandwiches for meals and will have fruits for snacks          Exercise:  walks up to 3 miles daily      Dietician visit: Most recent:200              Weight history:  Wt Readings from Last 3 Encounters:  07/24/18 179 lb (81.2 kg)  07/14/18 183 lb (83 kg)  06/28/18 185 lb 6.4 oz (84.1 kg)    OTHER active problems: See Review of systems  Office Visit on 07/24/2018  Component Date Value Ref Range Status  . Hemoglobin A1C 07/24/2018 6.3* 4.0 - 5.6 % Final  Allergies as of 07/24/2018      Reactions   Sulfa Antibiotics Swelling   Swelling of lips only.      Medication List        Accurate as of 07/24/18 10:58 AM. Always use your most recent med list.          ACCU-CHEK COMPACT PLUS test strip Generic drug:  glucose blood use 1 TEST STRIP once daily as directed   aspirin 81 MG tablet Take 81 mg by mouth daily.   atorvastatin 80 MG tablet Commonly known as:  LIPITOR Take 40 mg by mouth daily at 6 PM.   buPROPion 150 MG 24 hr tablet Commonly known as:  WELLBUTRIN XL TAKE 1 TABLET BY MOUTH EVERY DAY   carvedilol 12.5 MG tablet Commonly known as:  COREG Take 6.25 mg by mouth 2 (two) times daily with a meal.   CHELATED MAGNESIUM PO Take 250 mg by mouth daily.   clobetasol ointment 0.05 % Commonly known as:  TEMOVATE Apply 1 application topically daily as needed (itching).   CoQ10 100 MG Caps Take by mouth.   donepezil 10 MG tablet Commonly known as:  ARICEPT Take 1/2 tablet daily for 2 weeks, then  increase to 1 tablet daily   furosemide 40 MG tablet Commonly known as:  LASIX TAKE 1 TABLET BY MOUTH DAILY WITH BREAKFAST   JANUMET XR 50-1000 MG Tb24 Generic drug:  SitaGLIPtin-MetFORMIN HCl Take 0.5 tablets by mouth 2 (two) times daily.   JANUMET XR 50-1000 MG Tb24 Generic drug:  SitaGLIPtin-MetFORMIN HCl TAKE 2 TABLETS BY MOUTH DAILY   latanoprost 0.005 % ophthalmic solution Commonly known as:  XALATAN Place 1 drop into the right eye daily.   LORazepam 0.5 MG tablet Commonly known as:  ATIVAN Take 0.5 mg by mouth every 8 (eight) hours. As needed   spironolactone 25 MG tablet Commonly known as:  ALDACTONE TAKE 1 TABLET BY MOUTH EVERY DAY   TURMERIC PO Take 1 tablet by mouth daily.   VIAGRA 100 MG tablet Generic drug:  sildenafil TAKE ONE TABLET BY MOUTH AS NEEDED FOR ERECTILE DYSFUNCTION.       Allergies:  Allergies  Allergen Reactions  . Sulfa Antibiotics Swelling    Swelling of lips only.    Past Medical History:  Diagnosis Date  . Agent orange exposure    in [place]  . Anemia   . Heart block AV second degree    a. s/p PPM implant with subsequent CRTD upgrade  . High cholesterol   . History of colon polyps   . Hypertension   . LBBB (left bundle branch block)   . Nonischemic cardiomyopathy ([place])    a. MDT CRTD upgrade 2016  . Pacemaker 08/27/2013   Dual-chamber Medtronic Adapta implanted January 2013 for bradycardia with alternating bundle branch block and 2:1 atrioventricular block   . Prostate cancer ([place])    "not been treated yet; on a wait and see" (01/22/2015)  . Type II diabetes mellitus ([place])     Past Surgical History:  Procedure Laterality Date  . BASAL CELL CARCINOMA EXCISION     "back, face, neck"  . BI-VENTRICULAR IMPLANTABLE CARDIOVERTER DEFIBRILLATOR UPGRADE N/A 01/22/2015   MDT CRTD upgrade by Dr Rayann Heman  . CARDIAC CATHETERIZATION  01/27/2012   normal coronaries, dilated LV c/w nonischemic cardiomyopathy  . EXAMINATION UNDER  ANESTHESIA  04/06/2012   Procedure: EXAM UNDER ANESTHESIA;  Surgeon: Marcello Moores A. Cornett, MD;  Location: [place];  Service: General;  Laterality:  N/A;  . INCISION AND DRAINAGE PERIRECTAL ABSCESS  ~ 2010; 2015  . INGUINAL HERNIA REPAIR Left 1980  . LEFT HEART CATHETERIZATION WITH CORONARY ANGIOGRAM N/A 01/27/2012   Procedure: LEFT HEART CATHETERIZATION WITH CORONARY ANGIOGRAM;  Surgeon: Troy Sine, MD;  Location: [facility] CATH LAB;  Service: Cardiovascular;  Laterality: N/A;  . NM MYOCAR PERF WALL MOTION  01/21/2012   mod-severe perfusion defect due to infarct/scar w/mild perinfarct ischemia in the apical,basal inferoseptal,basal inferior,mid inferoseptal,midinferior and apical inferior regions  . PERMANENT PACEMAKER INSERTION N/A 12/01/2011   Medtronic implanted by Dr Sallyanne Kuster  . PROSTATE BIOPSY    . PROSTATE BIOPSY    . shrapnel surgery     "in [place]"  . TONSILLECTOMY      Family History  Problem Relation Age of Onset  . Ovarian cancer Mother   . Leukemia Father   . Heart disease Father   . Diabetes Neg Hx     Social History:  reports that he quit smoking about 34 years ago. His smoking use included cigarettes. He has a 20.00 pack-year smoking history. He has never used smokeless tobacco. He reports that he drinks alcohol. He reports that he does not use drugs.    Review of Systems  .   He has eye exams every 6 months regularly at the [facility] and no records available      Lipids: He has been treated with Lipitor 80 mg, this is also followed by [facility]       Lab Results  Component Value Date   CHOL 104 12/07/2016   HDL 34.90 (L) 12/07/2016   LDLCALC 56 12/07/2016   LDLDIRECT 58.6 09/24/2014   TRIG 67.0 12/07/2016   CHOLHDL 3 12/07/2016     Hypertension/CHF: Mild and well controlled with, Aldactone low-dose and carvedilol  Also taking Lasix and followed by cardiologist   Neuropathy: No symptoms currently     Physical Examination:  Diabetic Foot Exam -  Simple   Simple Foot Form Diabetic Foot exam was performed with the following findings:  Yes   Visual Inspection No deformities, no ulcerations, no other skin breakdown bilaterally:  Yes Sensation Testing Intact to touch and monofilament testing bilaterally:  Yes Pulse Check Posterior Tibialis and Dorsalis pulse intact bilaterally:  Yes Comments     BP 132/76   Pulse 69   Ht 6\' 3"  (1.905 m)   Wt 179 lb (81.2 kg)   SpO2 98%   BMI 22.37 kg/m   ASSESSMENT/PLAN:   Diabetic Foot Exam - Simple   Simple Foot Form Diabetic Foot exam was performed with the following findings:  Yes   Visual Inspection No deformities, no ulcerations, no other skin breakdown bilaterally:  Yes Sensation Testing Intact to touch and monofilament testing bilaterally:  Yes Pulse Check Posterior Tibialis and Dorsalis pulse intact bilaterally:  Yes Comments      Diabetes type 2, Nonobese  See history of present illness for detailed discussion of current diabetes management, blood sugar patterns and problems identified     His A1c is improved compared to previously and now 6.3  This is despite not taking glipizide and possibly only 1 tablet daily of Janumet He did not bring his monitor for download Most likely because of his walking regularly and exercising at the gym he has had better control with less medication He will call back to let us know what dose of Janumet he is taking Discussed checking blood sugars regularly after meals and bring his monitor  for download on each visit  Neuropathy: He has had no symptoms lately  HYPERLIPIDEMIA: No records available and will update his labs today   Patient Instructions  Need dose of janumet  Check blood sugars on waking up  3/7  Also check blood sugars about 2 hours after a meal and do this after different meals by rotation  Recommended blood sugar levels on waking up is 90-130 and about 2 hours after meal is 130-160  Please bring your blood  sugar monitor to each visit, thank you     Elayne Snare 07/24/2018, 10:58 AM      Note: This office note was prepared with Dragon voice recognition system technology. Any transcriptional errors that result from this process are unintentional.

## 2018-07-24 ENCOUNTER — Ambulatory Visit (INDEPENDENT_AMBULATORY_CARE_PROVIDER_SITE_OTHER): Payer: Medicare HMO | Admitting: Endocrinology

## 2018-07-24 ENCOUNTER — Encounter: Payer: Self-pay | Admitting: Endocrinology

## 2018-07-24 VITALS — BP 132/76 | HR 69 | Ht 75.0 in | Wt 179.0 lb

## 2018-07-24 DIAGNOSIS — E1165 Type 2 diabetes mellitus with hyperglycemia: Secondary | ICD-10-CM

## 2018-07-24 LAB — COMPREHENSIVE METABOLIC PANEL
ALBUMIN: 4.2 g/dL (ref 3.5–5.2)
ALK PHOS: 66 U/L (ref 39–117)
ALT: 6 U/L (ref 0–53)
AST: 15 U/L (ref 0–37)
BILIRUBIN TOTAL: 0.7 mg/dL (ref 0.2–1.2)
BUN: 13 mg/dL (ref 6–23)
CALCIUM: 9.6 mg/dL (ref 8.4–10.5)
CO2: 29 mEq/L (ref 19–32)
CREATININE: 1.12 mg/dL (ref 0.40–1.50)
Chloride: 103 mEq/L (ref 96–112)
GFR: 68.37 mL/min (ref >60.00)
Glucose, Bld: 128 mg/dL — ABNORMAL HIGH (ref 70–99)
Potassium: 4.2 mEq/L (ref 3.5–5.1)
Sodium: 139 mEq/L (ref 135–145)
TOTAL PROTEIN: 7.4 g/dL (ref 6.0–8.3)

## 2018-07-24 LAB — LIPID PANEL
CHOLESTEROL: 91 mg/dL (ref 0–200)
HDL: 30.9 mg/dL — ABNORMAL LOW (ref >39.00)
LDL Cholesterol: 38 mg/dL (ref 0–99)
NonHDL: 59.82
TRIGLYCERIDES: 107 mg/dL (ref 0.0–149.0)
Total CHOL/HDL Ratio: 3
VLDL: 21.4 mg/dL (ref 0.0–40.0)

## 2018-07-24 LAB — POCT GLYCOSYLATED HEMOGLOBIN (HGB A1C): HEMOGLOBIN A1C: 6.3 % — AB (ref 4.0–5.6)

## 2018-07-24 LAB — MICROALBUMIN / CREATININE URINE RATIO
CREATININE, U: 188.1 mg/dL
MICROALB UR: 2.4 mg/dL — AB (ref 0.0–1.9)
MICROALB/CREAT RATIO: 1.3 mg/g (ref 0.0–30.0)

## 2018-07-24 NOTE — Patient Instructions (Addendum)
Need dose of janumet  Check blood sugars on waking up  3/7  Also check blood sugars about 2 hours after a meal and do this after different meals by rotation  Recommended blood sugar levels on waking up is 90-130 and about 2 hours after meal is 130-160  Please bring your blood sugar monitor to each visit, thank you

## 2018-07-28 ENCOUNTER — Ambulatory Visit
Admission: RE | Admit: 2018-07-28 | Discharge: 2018-07-28 | Disposition: A | Discharge status: Home / Self Care | Payer: Medicare HMO | Source: Ambulatory Visit | Attending: Neurology | Admitting: Neurology

## 2018-07-28 DIAGNOSIS — R413 Other amnesia: Secondary | ICD-10-CM | POA: Diagnosis not present

## 2018-07-28 DIAGNOSIS — H8110 Benign paroxysmal vertigo, unspecified ear: Secondary | ICD-10-CM

## 2018-07-28 DIAGNOSIS — G3184 Mild cognitive impairment, so stated: Secondary | ICD-10-CM

## 2018-07-31 ENCOUNTER — Telehealth: Payer: Self-pay | Admitting: Neurology

## 2018-07-31 ENCOUNTER — Ambulatory Visit (INDEPENDENT_AMBULATORY_CARE_PROVIDER_SITE_OTHER): Payer: Medicare HMO

## 2018-07-31 ENCOUNTER — Telehealth: Payer: Self-pay

## 2018-07-31 DIAGNOSIS — I5042 Chronic combined systolic (congestive) and diastolic (congestive) heart failure: Secondary | ICD-10-CM

## 2018-07-31 DIAGNOSIS — Z95810 Presence of automatic (implantable) cardiac defibrillator: Secondary | ICD-10-CM | POA: Diagnosis not present

## 2018-07-31 NOTE — Telephone Encounter (Signed)
Returned call.  No answer.  LMOM asking for return call.  

## 2018-07-31 NOTE — Telephone Encounter (Signed)
Patient is experiencing extreme dizziness and Nausea once the dosage had been increased. Please Call [phone]. Thanks

## 2018-07-31 NOTE — Telephone Encounter (Signed)
Remote ICM transmission received.  Attempted call to patient and left detailed message, per DPR, regarding transmission and next ICM scheduled for 08/31/2018.  Advised to return call for any fluid symptoms or questions.    

## 2018-07-31 NOTE — Progress Notes (Signed)
EPIC Encounter for ICM Monitoring  Patient Name: Kevin [name] is a 72 y.o. male Date: 07/31/2018 Primary Care Physican: [name], MD Primary Cardiologist:[name] Electrophysiologist: [name] Dry Weight:179 lbs (per 06/30/18 office visit) Bi-V Pacing: 99.6%      Attempted call to patient and unable to reach.  Left detailed message, per DPR, regarding transmission.  Transmission reviewed.    Thoracic impedance normal.  Prescribed: Furosemide 40 mg 1 tablet daily  Labs: 02/02/2017 Creatinine 1.34, BUN 20, Potassium 4.1, Sodium 137, EGFR 55.82 12/07/2016 Creatinine 1.13, BUN 13, Potassium 4.6, Sodium 135 09/10/2016 Creatinine 1.07, BUN 25, Potassium 4.4, Sodium 135 09/02/2016 Creatinine 1.15, BUN 23, Potassium 4.3, Sodium 136  06/08/2016 Creatinine 1.14, BUN 18, Potassium 4.1, Sodium 134 04/16/2016 Creatinine 0.96, BUN 12, Potassium 4.4, Sodium 137 08/22/2015 Creatinine 0.98, BUN 16, Potassium 4.4, Sodium 138  Recommendations: Left voice mail with ICM number and encouraged to call if experiencing any fluid symptoms.  Follow-up plan: ICM clinic phone appointment on 08/31/2018  Copy of ICM check sent to Dr. Rayann Heman.   3 month ICM trend: 07/31/2018    1 Year ICM trend:       Rosalene Billings, RN 07/31/2018 9:22 AM

## 2018-08-08 ENCOUNTER — Telehealth: Payer: Self-pay | Admitting: Neurology

## 2018-08-08 NOTE — Telephone Encounter (Signed)
Returned call.  No answer.  LMOM asking for return call.    CT head - no evidence of tumor, stroke or bleed.  Age related changes.

## 2018-08-08 NOTE — Telephone Encounter (Signed)
Patient is calling in wanting his recent MRI Results. Please call him back at [phone]. Thanks!

## 2018-08-10 NOTE — Telephone Encounter (Signed)
Patient showed up at [place] for results. He got on the phone and I confirmed his DOB. Given results of CT. Will call with any further questions.

## 2018-08-15 DIAGNOSIS — H524 Presbyopia: Secondary | ICD-10-CM | POA: Diagnosis not present

## 2018-08-15 DIAGNOSIS — E139 Other specified diabetes mellitus without complications: Secondary | ICD-10-CM | POA: Diagnosis not present

## 2018-08-21 ENCOUNTER — Ambulatory Visit (INDEPENDENT_AMBULATORY_CARE_PROVIDER_SITE_OTHER): Payer: Medicare HMO | Admitting: *Deleted

## 2018-08-21 DIAGNOSIS — I428 Other cardiomyopathies: Secondary | ICD-10-CM | POA: Diagnosis not present

## 2018-08-21 NOTE — Progress Notes (Signed)
Remote ICD transmission.   

## 2018-08-24 ENCOUNTER — Encounter: Payer: Self-pay | Admitting: Cardiology

## 2018-08-31 ENCOUNTER — Ambulatory Visit (INDEPENDENT_AMBULATORY_CARE_PROVIDER_SITE_OTHER): Payer: Medicare HMO

## 2018-08-31 ENCOUNTER — Telehealth: Payer: Self-pay

## 2018-08-31 DIAGNOSIS — Z95810 Presence of automatic (implantable) cardiac defibrillator: Secondary | ICD-10-CM

## 2018-08-31 DIAGNOSIS — I5042 Chronic combined systolic (congestive) and diastolic (congestive) heart failure: Secondary | ICD-10-CM | POA: Diagnosis not present

## 2018-08-31 NOTE — Progress Notes (Addendum)
EPIC Encounter for ICM Monitoring  Patient Name: Kevin [name] is a 72 y.o. male Date: 08/31/2018 Primary Care Physican: [name], MD Primary Cardiologist:[name] Electrophysiologist: [name] Dry Weight:179 lbs (per 06/30/18 office visit) Bi-V Pacing: 99.8%                                          Attempted call to patient and unable to reach.  Left detailed message, per DPR, regarding transmission.  Transmission reviewed.    Thoracic impedance normal.  Prescribed: Furosemide 40 mg 1 tablet daily  Labs: 07/24/2018 Creatinine 1.12, BUN 13, Potassium 4.2, Sodium 139, eGFR 68.37 02/02/2017 Creatinine 1.34, BUN 20, Potassium 4.1, Sodium 137, EGFR 55.82 12/07/2016 Creatinine 1.13, BUN 13, Potassium 4.6, Sodium 135 09/10/2016 Creatinine 1.07, BUN 25, Potassium 4.4, Sodium 135 09/02/2016 Creatinine 1.15, BUN 23, Potassium 4.3, Sodium 136  06/08/2016 Creatinine 1.14, BUN 18, Potassium 4.1, Sodium 134 04/16/2016 Creatinine 0.96, BUN 12, Potassium 4.4, Sodium 137 08/22/2015 Creatinine 0.98, BUN 16, Potassium 4.4, Sodium 138  Recommendations: Left voice mail with ICM number and encouraged to call if experiencing any fluid symptoms.  Follow-up plan: ICM clinic phone appointment on 10/02/2018.    Copy of ICM check sent to Dr. Rayann Heman.   3 month ICM trend: 08/31/2018    1 Year ICM trend:       Rosalene Billings, RN 08/31/2018 10:34 AM

## 2018-08-31 NOTE — Telephone Encounter (Signed)
Remote ICM transmission received.  Attempted call to patient regarding ICM remote transmission and left detailed message, per DPR, with next ICM remote transmission date of 09/12/2018.  Advised to return call for any fluid symptoms or questions.

## 2018-09-13 LAB — CUP PACEART REMOTE DEVICE CHECK
Battery Voltage: 2.91 V
Brady Statistic AP VP Percent: 0.08 %
Brady Statistic AS VS Percent: 0.32 %
Brady Statistic RA Percent Paced: 0.09 %
Brady Statistic RV Percent Paced: 99.54 %
Date Time Interrogation Session: 20191014073324
HIGH POWER IMPEDANCE MEASURED VALUE: 68 Ohm
Implantable Lead Implant Date: 20160316
Implantable Lead Location: 753860
Implantable Lead Model: 4598
Implantable Lead Model: 5076
Implantable Lead Model: 6935
Lead Channel Impedance Value: 1045 Ohm
Lead Channel Impedance Value: 399 Ohm
Lead Channel Impedance Value: 437 Ohm
Lead Channel Impedance Value: 513 Ohm
Lead Channel Impedance Value: 646 Ohm
Lead Channel Impedance Value: 836 Ohm
Lead Channel Impedance Value: 969 Ohm
Lead Channel Pacing Threshold Amplitude: 0.5 V
Lead Channel Pacing Threshold Amplitude: 0.625 V
Lead Channel Pacing Threshold Amplitude: 2.75 V
Lead Channel Sensing Intrinsic Amplitude: 12 mV
Lead Channel Setting Pacing Amplitude: 2.5 V
Lead Channel Setting Pacing Amplitude: 3.25 V
Lead Channel Setting Sensing Sensitivity: 0.3 mV
MDC IDC LEAD IMPLANT DT: 20130123
MDC IDC LEAD IMPLANT DT: 20160316
MDC IDC LEAD LOCATION: 753858
MDC IDC LEAD LOCATION: 753859
MDC IDC MSMT BATTERY REMAINING LONGEVITY: 18 mo
MDC IDC MSMT LEADCHNL LV IMPEDANCE VALUE: 1007 Ohm
MDC IDC MSMT LEADCHNL LV IMPEDANCE VALUE: 475 Ohm
MDC IDC MSMT LEADCHNL LV IMPEDANCE VALUE: 513 Ohm
MDC IDC MSMT LEADCHNL LV IMPEDANCE VALUE: 589 Ohm
MDC IDC MSMT LEADCHNL LV IMPEDANCE VALUE: 855 Ohm
MDC IDC MSMT LEADCHNL LV PACING THRESHOLD PULSEWIDTH: 1 ms
MDC IDC MSMT LEADCHNL RA IMPEDANCE VALUE: 342 Ohm
MDC IDC MSMT LEADCHNL RA PACING THRESHOLD PULSEWIDTH: 0.4 ms
MDC IDC MSMT LEADCHNL RA SENSING INTR AMPL: 4.375 mV
MDC IDC MSMT LEADCHNL RA SENSING INTR AMPL: 4.375 mV
MDC IDC MSMT LEADCHNL RV PACING THRESHOLD PULSEWIDTH: 0.4 ms
MDC IDC MSMT LEADCHNL RV SENSING INTR AMPL: 12 mV
MDC IDC PG IMPLANT DT: 20160316
MDC IDC SET LEADCHNL LV PACING PULSEWIDTH: 1 ms
MDC IDC SET LEADCHNL RA PACING AMPLITUDE: 2 V
MDC IDC SET LEADCHNL RV PACING PULSEWIDTH: 0.4 ms
MDC IDC STAT BRADY AP VS PERCENT: 0.01 %
MDC IDC STAT BRADY AS VP PERCENT: 99.59 %

## 2018-10-02 ENCOUNTER — Telehealth: Payer: Self-pay

## 2018-10-02 ENCOUNTER — Ambulatory Visit (INDEPENDENT_AMBULATORY_CARE_PROVIDER_SITE_OTHER): Payer: Medicare HMO

## 2018-10-02 DIAGNOSIS — Z95810 Presence of automatic (implantable) cardiac defibrillator: Secondary | ICD-10-CM | POA: Diagnosis not present

## 2018-10-02 DIAGNOSIS — I5042 Chronic combined systolic (congestive) and diastolic (congestive) heart failure: Secondary | ICD-10-CM

## 2018-10-02 NOTE — Telephone Encounter (Signed)
Remote ICM transmission received.  Attempted call to patient regarding ICM remote transmission and left detailed message, per DPR, with next ICM remote transmission date of 11/02/2018.  Advised to return call for any fluid symptoms or questions.

## 2018-10-02 NOTE — Progress Notes (Signed)
EPIC Encounter for ICM Monitoring  Patient Name: Kevin [name] is a 72 y.o. male Date: 10/02/2018 Primary Care Physican: Kevin Post, MD Primary Cardiologist:Kevin [name] Electrophysiologist: Kevin [name] Bi-V Pacing: 99.9% Last Weight:179 lbs (per 06/30/18 office visit)   Attempted call to patient and unable to reach.Left detailed message, per DPR, regarding transmission. Transmission reviewed.   Thoracic impedance normal.  Prescribed:Furosemide 40 mg 1 tablet daily  Labs: 07/24/2018 Creatinine 1.12, BUN 13, Potassium 4.2, Sodium 139, eGFR 68.37 02/02/2017 Creatinine 1.34, BUN 20, Potassium 4.1, Sodium 137, EGFR 55.82 12/07/2016 Creatinine 1.13, BUN 13, Potassium 4.6, Sodium 135 09/10/2016 Creatinine 1.07, BUN 25, Potassium 4.4, Sodium 135 09/02/2016 Creatinine 1.15, BUN 23, Potassium 4.3, Sodium 136  06/08/2016 Creatinine 1.14, BUN 18, Potassium 4.1, Sodium 134 04/16/2016 Creatinine 0.96, BUN 12, Potassium 4.4, Sodium 137 08/22/2015 Creatinine 0.98, BUN 16, Potassium 4.4, Sodium 138  Recommendations:Left voice mail with ICM number and encouraged to call if experiencing any fluid symptoms.  Follow-up plan: ICM clinic phone appointment on 11/02/2018.    Copy of ICM check sent to Dr. Rayann [name].   3 month ICM trend: 10/02/2018    1 Year ICM trend:       Rosalene Billings, RN 10/02/2018 8:57 AM

## 2018-10-23 ENCOUNTER — Other Ambulatory Visit: Payer: Self-pay

## 2018-10-23 ENCOUNTER — Ambulatory Visit: Payer: Non-veteran care | Admitting: Endocrinology

## 2018-10-31 ENCOUNTER — Other Ambulatory Visit: Payer: Self-pay | Admitting: Family Medicine

## 2018-11-02 ENCOUNTER — Telehealth: Payer: Self-pay

## 2018-11-02 ENCOUNTER — Ambulatory Visit (INDEPENDENT_AMBULATORY_CARE_PROVIDER_SITE_OTHER): Payer: Medicare HMO

## 2018-11-02 DIAGNOSIS — I5042 Chronic combined systolic (congestive) and diastolic (congestive) heart failure: Secondary | ICD-10-CM

## 2018-11-02 DIAGNOSIS — Z95810 Presence of automatic (implantable) cardiac defibrillator: Secondary | ICD-10-CM

## 2018-11-02 NOTE — Progress Notes (Signed)
EPIC Encounter for ICM Monitoring  Patient Name: Kevin [name] is a 72 y.o. male Date: 11/02/2018 Primary Care Physican: [name], MD Primary Cardiologist:[name] Electrophysiologist: [name] Bi-V Pacing: 99.9% Last Weight:179 lbs (on 07/24/18)   Attempted call to patient and unable to reach.Transmission reviewed.   Thoracic impedance abnormal suggesting fluid accumulation since 10/22/2018..  Prescribed:Furosemide 40 mg 1 tablet daily  Labs: 07/24/2018 Creatinine 1.12, BUN 13, Potassium 4.2, Sodium 139, eGFR 68.37 02/02/2017 Creatinine 1.34, BUN 20, Potassium 4.1, Sodium 137, EGFR 55.82 12/07/2016 Creatinine 1.13, BUN 13, Potassium 4.6, Sodium 135 09/10/2016 Creatinine 1.07, BUN 25, Potassium 4.4, Sodium 135 09/02/2016 Creatinine 1.15, BUN 23, Potassium 4.3, Sodium 136  06/08/2016 Creatinine 1.14, BUN 18, Potassium 4.1, Sodium 134 04/16/2016 Creatinine 0.96, BUN 12, Potassium 4.4, Sodium 137 08/22/2015 Creatinine 0.98, BUN 16, Potassium 4.4, Sodium 138  Recommendations:Unable to reach.  Follow-up plan: ICM clinic phone appointment on1/12/2018 to recheck fluid levels.   Copy of ICM check sent to Dr.[name] and Dr Sallyanne Kuster.   3 month ICM trend: 11/02/2018    1 Year ICM trend:       Rosalene Billings, RN 11/02/2018 4:14 PM

## 2018-11-02 NOTE — Telephone Encounter (Signed)
Remote ICM transmission received.  Attempted call to patient regarding ICM remote transmission and no answer.  

## 2018-11-07 ENCOUNTER — Encounter: Payer: Self-pay | Admitting: Endocrinology

## 2018-11-07 ENCOUNTER — Ambulatory Visit (INDEPENDENT_AMBULATORY_CARE_PROVIDER_SITE_OTHER): Payer: Medicare HMO | Admitting: Endocrinology

## 2018-11-07 VITALS — BP 128/72 | HR 68 | Ht 75.0 in | Wt 180.8 lb

## 2018-11-07 DIAGNOSIS — E1165 Type 2 diabetes mellitus with hyperglycemia: Secondary | ICD-10-CM | POA: Diagnosis not present

## 2018-11-07 LAB — GLUCOSE, POCT (MANUAL RESULT ENTRY): POC GLUCOSE: 176 mg/dL — AB (ref 70–99)

## 2018-11-07 LAB — BASIC METABOLIC PANEL
BUN: 15 mg/dL (ref 6–23)
CO2: 30 mEq/L (ref 19–32)
Calcium: 9.1 mg/dL (ref 8.4–10.5)
Chloride: 104 mEq/L (ref 96–112)
Creatinine, Ser: 1.24 mg/dL (ref 0.40–1.50)
GFR: 60.74 mL/min (ref >60.00)
Glucose, Bld: 166 mg/dL — ABNORMAL HIGH (ref 70–99)
POTASSIUM: 4.8 meq/L (ref 3.5–5.1)
SODIUM: 139 meq/L (ref 135–145)

## 2018-11-07 LAB — POCT GLYCOSYLATED HEMOGLOBIN (HGB A1C): Hemoglobin A1C: 6.1 % — AB (ref 4.0–5.6)

## 2018-11-07 NOTE — Progress Notes (Signed)
Patient ID: Kevin [name], male   DOB: 11-10-1945, 72 y.o.   MRN: [id number]    Reason for Appointment:  Followup for Type 2 Diabetes   History of Present Illness:          Diagnosis: Type 2 diabetes mellitus, date of diagnosis: 2001        Past history: His previous records are not available but initially his symptoms included weight loss He thinks he has been on metformin for several years and more recently has been taking 1000 mg twice a day [facility] his [facility] His A1c was 6.9 in 2013 but a year later was 8.1 He had been on glipizide 5 mg twice a day in addition to his metformin 1 g twice a day   Prior to his consultation when his A1c was 10% and he was given Januvia in addition  Recent history:   Oral hypoglycemic drugs the patient is taking EXH:[id number] XR 1 tablet daily  His A1c is 6.1, previously 6.3  Current management, blood sugar patterns and problems identified:   Currently he is only taking Janumet 1 tablet per day but for some reason he was told by another doctor to take 1/2 tablet twice a day so that he does not get dizzy  He forgot to bring his meter again but he is usually checking sugars somewhat randomly  His wife was present today and able to give a better history, he is usually asking his wife to check his sugar  Today after eating a ham biscuit with gravy and having honey with his coffee his blood sugar is 176  However A1c is better  He is still walking regularly and able to maintain his weight   Hypoglycemia: None recently       Side effects from medications have been: None  Glucose monitoring:  done 1 or less times a day         Glucometer:  Accu-Chek ?     Blood Glucose readings  by recall: Blood sugars usually checked randomly 3-4 times a week and usually about 114 or 117    Glycemic control:    Lab Results  Component Value Date   HGBA1C 6.1 (A) 11/07/2018   HGBA1C 6.3 (A)  07/24/2018   HGBA1C 6.5 03/03/2018   Lab Results  Component Value Date   MICROALBUR 2.4 (H) 07/24/2018   LDLCALC 38 07/24/2018   CREATININE 1.12 07/24/2018     Self-care: The diet that the patient has been following is: tries to limit portions, may get fried food when eating out, some fast food    Meals: 3 meals per day.  breakfast is at 7-8 am and is usually eating a biscuit or special K. cereal Usually eating baked chicken or chicken salad sandwiches for meals and will have fruits for snacks          Exercise:  walks up to 10 miles a week, also active volunteering at the hospital      Dietician visit: Most recent:200              Weight history:  Wt Readings from Last 3 Encounters:  11/07/18 180 lb 12.8 oz (82 kg)  07/24/18 179 lb (81.2 kg)  07/14/18 183 lb (83 kg)    OTHER active problems: See Review of systems  Office Visit on 11/07/2018  Component Date Value Ref Range Status  . Hemoglobin A1C 11/07/2018 6.1* 4.0 - 5.6 % Final  .  POC Glucose 11/07/2018 176* 70 - 99 mg/dl Final    Allergies as of 11/07/2018      Reactions   Sulfa Antibiotics Swelling   Swelling of lips only.      Medication List       Accurate as of November 07, 2018  9:09 AM. Always use your most recent med list.        ACCU-CHEK COMPACT PLUS test strip Generic drug:  glucose blood use 1 TEST STRIP once daily as directed   aspirin 81 MG tablet Take 81 mg by mouth daily.   atorvastatin 80 MG tablet Commonly known as:  LIPITOR Take 40 mg by mouth daily at 6 PM.   buPROPion 150 MG 24 hr tablet Commonly known as:  WELLBUTRIN XL TAKE 1 TABLET BY MOUTH EVERY DAY   carvedilol 12.5 MG tablet Commonly known as:  COREG Take 6.25 mg by mouth 2 (two) times daily with a meal.   CHELATED MAGNESIUM PO Take 250 mg by mouth daily.   clobetasol ointment 0.05 % Commonly known as:  TEMOVATE Apply 1 application topically daily as needed (itching).   CoQ10 100 MG Caps Take by mouth.     donepezil 10 MG tablet Commonly known as:  ARICEPT Take 1/2 tablet daily for 2 weeks, then increase to 1 tablet daily   JANUMET XR 50-1000 MG Tb24 Generic drug:  SitaGLIPtin-MetFORMIN HCl TAKE 2 TABLETS BY MOUTH DAILY   latanoprost 0.005 % ophthalmic solution Commonly known as:  XALATAN Place 1 drop into the right eye daily.   LORazepam 0.5 MG tablet Commonly known as:  ATIVAN Take 0.5 mg by mouth every 8 (eight) hours. As needed   TURMERIC PO Take 1 tablet by mouth daily.   VIAGRA 100 MG tablet Generic drug:  sildenafil TAKE ONE TABLET BY MOUTH AS NEEDED FOR ERECTILE DYSFUNCTION.       Allergies:  Allergies  Allergen Reactions  . Sulfa Antibiotics Swelling    Swelling of lips only.    Past Medical History:  Diagnosis Date  . Agent orange exposure    in [place]  . Anemia   . Heart block AV second degree    a. s/p PPM implant with subsequent CRTD upgrade  . High cholesterol   . History of colon polyps   . Hypertension   . LBBB (left bundle branch block)   . Nonischemic cardiomyopathy ([place])    a. MDT CRTD upgrade 2016  . Pacemaker 08/27/2013   Dual-chamber Medtronic Adapta implanted January 2013 for bradycardia with alternating bundle branch block and 2:1 atrioventricular block   . Prostate cancer ([place])    "not been treated yet; on a wait and see" (01/22/2015)  . Type II diabetes mellitus ([place])     Past Surgical History:  Procedure Laterality Date  . BASAL CELL CARCINOMA EXCISION     "back, face, neck"  . BI-VENTRICULAR IMPLANTABLE CARDIOVERTER DEFIBRILLATOR UPGRADE N/A 01/22/2015   MDT CRTD upgrade by Dr Rayann Heman  . CARDIAC CATHETERIZATION  01/27/2012   normal coronaries, dilated LV c/w nonischemic cardiomyopathy  . EXAMINATION UNDER ANESTHESIA  04/06/2012   Procedure: EXAM UNDER ANESTHESIA;  Surgeon: Marcello Moores A. Cornett, MD;  Location: [place];  Service: General;  Laterality: N/A;  . INCISION AND DRAINAGE PERIRECTAL ABSCESS  ~ 2010; 2015  . INGUINAL HERNIA  REPAIR Left 1980  . LEFT HEART CATHETERIZATION WITH CORONARY ANGIOGRAM N/A 01/27/2012   Procedure: LEFT HEART CATHETERIZATION WITH CORONARY ANGIOGRAM;  Surgeon: Troy Sine, MD;  Location: [place] CATH LAB;  Service: Cardiovascular;  Laterality: N/A;  . NM MYOCAR PERF WALL MOTION  01/21/2012   mod-severe perfusion defect due to infarct/scar w/mild perinfarct ischemia in the apical,basal inferoseptal,basal inferior,mid inferoseptal,midinferior and apical inferior regions  . PERMANENT PACEMAKER INSERTION N/A 12/01/2011   Medtronic implanted by Dr Sallyanne Kuster  . PROSTATE BIOPSY    . PROSTATE BIOPSY    . shrapnel surgery     "in [place]"  . TONSILLECTOMY      Family History  Problem Relation Age of Onset  . Ovarian cancer Mother   . Leukemia Father   . Heart disease Father   . Diabetes Neg Hx     Social History:  reports that he quit smoking about 34 years ago. His smoking use included cigarettes. He has a 20.00 pack-year smoking history. He has never used smokeless tobacco. He reports current alcohol use. He reports that he does not use drugs.    Review of Systems  .   He has eye exams every 6 months regularly at the [facility], again no records available No difficulties with vision      Lipids: He has been treated with Lipitor 80 mg, this is also followed by [facility]       Lab Results  Component Value Date   CHOL 91 07/24/2018   HDL 30.90 (L) 07/24/2018   LDLCALC 38 07/24/2018   LDLDIRECT 58.6 09/24/2014   TRIG 107.0 07/24/2018   CHOLHDL 3 07/24/2018     Hypertension/CHF: Mild and well controlled with, Aldactone low-dose and carvedilol  Also taking Lasix and followed by cardiologist  He has occasional dizziness and has fallen, this occurs mostly in the mornings and is due to see a neurologist this week     Physical Examination:    BP 128/72 (BP Location: Left Arm, Patient Position: Sitting, Cuff Size: Normal)   Pulse 68   Ht 6\' 3"  (1.905 m)   Wt 180 lb  12.8 oz (82 kg)   SpO2 98%   BMI 22.60 kg/m   ASSESSMENT/PLAN:     Diabetes type 2, Nonobese  See history of present illness for detailed discussion of current diabetes management, blood sugar patterns and problems identified     His A1c is improved at 6.1  This is only with taking 50/1000 Janumet total per day He appears to have fairly good blood sugars at home by recall although postprandial reading may be occasionally higher such as today depending on his diet He is usually trying to walk regularly for exercise Weight is maintained  Discussed that the Janumet XR should be taken without splitting the tablet Since he may have better long-term control and better postprandial readings with 100 mg of Januvia he will be given a new prescription for Janumet XR 100/1001 tablet daily  He will ask his ophthalmologist to send [place] reports of his eye exam  Dizziness: He will discuss this with neurologist at upcoming visit and has no bearing on his diabetes or his treatment  There are no Patient Instructions on file for this visit.  Elayne Snare 11/07/2018, 9:09 AM      Note: This office note was prepared with Dragon voice recognition system technology. Any transcriptional errors that result from this process are unintentional.

## 2018-11-07 NOTE — Patient Instructions (Signed)
Bring Eye exam report  Check blood sugars on waking up 2 days a week  Also check blood sugars about 2 hours after meals and do this after different meals by rotation  Recommended blood sugar levels on waking up are 90-130 and about 2 hours after meal is 130-160  Please bring your blood sugar monitor to each visit, thank you

## 2018-11-09 ENCOUNTER — Ambulatory Visit (INDEPENDENT_AMBULATORY_CARE_PROVIDER_SITE_OTHER): Payer: Medicare HMO

## 2018-11-09 DIAGNOSIS — Z95810 Presence of automatic (implantable) cardiac defibrillator: Secondary | ICD-10-CM

## 2018-11-09 DIAGNOSIS — I5042 Chronic combined systolic (congestive) and diastolic (congestive) heart failure: Secondary | ICD-10-CM

## 2018-11-09 NOTE — Progress Notes (Signed)
EPIC Encounter for ICM Monitoring  Patient Name: Kevin [name] is a 73 y.o. male Date: 11/09/2018 Primary Care Physican: [name], MD Primary Cardiologist:[name] Electrophysiologist: [name] Bi-V Pacing: 99.9% LastWeight:179 lbs (on 07/24/18) Today's Weight: unknown  Transmission reviewed.  Thoracic impedance returned to normal since last ICM remote transmission 10/30/2018.   Prescribed: Furosemide 40 mg 1 tablet daily  Labs: 07/24/2018 Creatinine 1.12, BUN 13, Potassium 4.2, Sodium 139, eGFR 68.37 02/02/2017 Creatinine 1.34, BUN 20, Potassium 4.1, Sodium 137, EGFR 55.82 12/07/2016 Creatinine 1.13, BUN 13, Potassium 4.6, Sodium 135 09/10/2016 Creatinine 1.07, BUN 25, Potassium 4.4, Sodium 135 09/02/2016 Creatinine 1.15, BUN 23, Potassium 4.3, Sodium 136  06/08/2016 Creatinine 1.14, BUN 18, Potassium 4.1, Sodium 134 04/16/2016 Creatinine 0.96, BUN 12, Potassium 4.4, Sodium 137 08/22/2015 Creatinine 0.98, BUN 16, Potassium 4.4, Sodium 138  Recommendations:  None    Follow up plan: ICM clinic phone appointment on 12/04/2018.     Copy of ICM check sent to Dr. Rayann Heman.      3 month ICM trend: 11/09/2017    1 Year ICM trend:     Rosalene Billings, RN 11/09/2018 12:33 PM

## 2018-11-20 ENCOUNTER — Ambulatory Visit (INDEPENDENT_AMBULATORY_CARE_PROVIDER_SITE_OTHER): Payer: Medicare HMO

## 2018-11-20 DIAGNOSIS — I428 Other cardiomyopathies: Secondary | ICD-10-CM

## 2018-11-20 DIAGNOSIS — I441 Atrioventricular block, second degree: Secondary | ICD-10-CM

## 2018-11-20 NOTE — Progress Notes (Signed)
Remote ICD transmission.   

## 2018-11-21 LAB — CUP PACEART REMOTE DEVICE CHECK
Battery Remaining Longevity: 18 mo
Battery Voltage: 2.91 V
Brady Statistic AP VS Percent: 0.01 %
Brady Statistic AS VP Percent: 99.87 %
Brady Statistic RA Percent Paced: 0.13 %
Brady Statistic RV Percent Paced: 99.94 %
Date Time Interrogation Session: 20200113140845
HighPow Impedance: 77 Ohm
Implantable Lead Implant Date: 20130123
Implantable Lead Implant Date: 20160316
Implantable Lead Implant Date: 20160316
Implantable Lead Location: 753858
Implantable Lead Location: 753859
Implantable Lead Location: 753860
Implantable Lead Model: 4598
Implantable Lead Model: 5076
Implantable Pulse Generator Implant Date: 20160316
Lead Channel Impedance Value: 1083 Ohm
Lead Channel Impedance Value: 1121 Ohm
Lead Channel Impedance Value: 399 Ohm
Lead Channel Impedance Value: 418 Ohm
Lead Channel Impedance Value: 494 Ohm
Lead Channel Impedance Value: 513 Ohm
Lead Channel Impedance Value: 551 Ohm
Lead Channel Impedance Value: 570 Ohm
Lead Channel Impedance Value: 627 Ohm
Lead Channel Impedance Value: 684 Ohm
Lead Channel Impedance Value: 874 Ohm
Lead Channel Impedance Value: 912 Ohm
Lead Channel Pacing Threshold Amplitude: 0.375 V
Lead Channel Pacing Threshold Amplitude: 0.5 V
Lead Channel Pacing Threshold Amplitude: 2.5 V
Lead Channel Pacing Threshold Pulse Width: 0.4 ms
Lead Channel Pacing Threshold Pulse Width: 0.4 ms
Lead Channel Pacing Threshold Pulse Width: 1 ms
Lead Channel Sensing Intrinsic Amplitude: 3.625 mV
Lead Channel Sensing Intrinsic Amplitude: 3.625 mV
Lead Channel Sensing Intrinsic Amplitude: 4.75 mV
Lead Channel Setting Pacing Amplitude: 2 V
Lead Channel Setting Pacing Amplitude: 2.5 V
Lead Channel Setting Pacing Amplitude: 3 V
Lead Channel Setting Pacing Pulse Width: 0.4 ms
Lead Channel Setting Pacing Pulse Width: 1 ms
MDC IDC MSMT LEADCHNL LV IMPEDANCE VALUE: 1083 Ohm
MDC IDC MSMT LEADCHNL RA SENSING INTR AMPL: 4.75 mV
MDC IDC SET LEADCHNL RV SENSING SENSITIVITY: 0.3 mV
MDC IDC STAT BRADY AP VP PERCENT: 0.11 %
MDC IDC STAT BRADY AS VS PERCENT: 0.01 %

## 2018-12-04 ENCOUNTER — Ambulatory Visit (INDEPENDENT_AMBULATORY_CARE_PROVIDER_SITE_OTHER): Payer: Medicare HMO

## 2018-12-04 DIAGNOSIS — Z95810 Presence of automatic (implantable) cardiac defibrillator: Secondary | ICD-10-CM | POA: Diagnosis not present

## 2018-12-04 DIAGNOSIS — I5042 Chronic combined systolic (congestive) and diastolic (congestive) heart failure: Secondary | ICD-10-CM

## 2018-12-04 NOTE — Progress Notes (Signed)
EPIC Encounter for ICM Monitoring  Patient Name: Kevin [name] is a 73 y.o. male Date: 12/04/2018 Primary Care Physican: [name], MD Primary Cardiologist:[name] Electrophysiologist: [name] Bi-V Pacing: 99.9% LastWeight:179 lbs (on 07/24/18) Today's Weight: unknown  Attempted call to patient.  Left detailed message per DPR regarding transmission. Transmission reviewed.   Thoracic impedance normal.   Prescribed:  Furosemide 40 mg 1 tablet daily is no longer on patient's med list (unable to determine when discontinued)  Labs: 11/07/2018 Creatinine 1.24, BUN 15, Potassium 4.8, Sodium 139, eGFR 60.74 07/24/2018 Creatinine 1.12, BUN 13, Potassium 4.2, Sodium 139, eGFR 68.37 02/02/2017 Creatinine 1.34, BUN 20, Potassium 4.1, Sodium 137, EGFR 55.82 12/07/2016 Creatinine 1.13, BUN 13, Potassium 4.6, Sodium 135 09/10/2016 Creatinine 1.07, BUN 25, Potassium 4.4, Sodium 135 09/02/2016 Creatinine 1.15, BUN 23, Potassium 4.3, Sodium 136  06/08/2016 Creatinine 1.14, BUN 18, Potassium 4.1, Sodium 134 04/16/2016 Creatinine 0.96, BUN 12, Potassium 4.4, Sodium 137 08/22/2015 Creatinine 0.98, BUN 16, Potassium 4.4, Sodium 138  Recommendations:  Left voice mail with ICM number and encouraged to call if experiencing any fluid symptoms.    Follow up plan: ICM clinic phone appointment on 01/08/2019.     Copy of ICM check sent to Dr. Rayann Heman.      3 month ICM trend: 12/04/2018    1 Year ICM trend:       Rosalene Billings, RN 12/04/2018 4:44 PM

## 2018-12-23 ENCOUNTER — Other Ambulatory Visit: Payer: Self-pay | Admitting: Endocrinology

## 2019-01-08 ENCOUNTER — Ambulatory Visit (INDEPENDENT_AMBULATORY_CARE_PROVIDER_SITE_OTHER): Payer: Medicare HMO

## 2019-01-08 DIAGNOSIS — Z95810 Presence of automatic (implantable) cardiac defibrillator: Secondary | ICD-10-CM | POA: Diagnosis not present

## 2019-01-08 DIAGNOSIS — I5042 Chronic combined systolic (congestive) and diastolic (congestive) heart failure: Secondary | ICD-10-CM | POA: Diagnosis not present

## 2019-01-08 NOTE — Progress Notes (Signed)
EPIC Encounter for ICM Monitoring  Patient Name: Kevin [name] is a 73 y.o. male Date: 01/08/2019 Primary Care Physican: [name], MD Primary Cardiologist:[name] Electrophysiologist: [name] Bi-V Pacing: 99.9% LastWeight:180 lbs (on 10/28/18 office visit) Today's Weight: unknown  Transmission reviewed.   Thoracic impedancenormal.  Labs: 11/07/2018 Creatinine 1.24, BUN 15, Potassium 4.8, Sodium 139, eGFR 60.74 07/24/2018 Creatinine 1.12, BUN 13, Potassium 4.2, Sodium 139, eGFR 68.37 02/02/2017 Creatinine 1.34, BUN 20, Potassium 4.1, Sodium 137, EGFR 55.82  Recommendations:None  Follow up plan: ICM clinic phone appointment on4/14/2020.   Copy of ICM check sent to Dr.[name].   3 month ICM trend: 01/08/2019    1 Year ICM trend:       Rosalene Billings, RN 01/08/2019 2:44 PM

## 2019-01-24 ENCOUNTER — Ambulatory Visit: Payer: Self-pay

## 2019-01-24 NOTE — Telephone Encounter (Signed)
Patient called in with c/o "dizziness." He says "I've been dealing with this for 2-3 days. The room isn't spinning, but my head sitting here in the recliner feels dizzy. I can get up and walk without holding on, I can actually drive. I've been having a balance problem for a good while. Yesterday, I missed a step and fell. I didn't hurt myself." I asked about other symptoms, he denies. According to protocol, see PCP within 24 hours. Appointment scheduled for tomorrow, 01/25/19 at 1400 with Dr. Jerilee Hoh per office guidelines set for 01/25/19. Care advice given, patient verbalized understanding.   Reason for Disposition . [1] MODERATE dizziness (e.g., interferes with normal activities) AND [2] has NOT been evaluated by physician for this  (Exception: dizziness caused by heat exposure, sudden standing, or poor fluid intake)  Answer Assessment - Initial Assessment Questions 1. DESCRIPTION: "Describe your dizziness."     Lightheaded 2. LIGHTHEADED: "Do you feel lightheaded?" (e.g., somewhat faint, woozy, weak upon standing)     Yes 3. VERTIGO: "Do you feel like either you or the room is spinning or tilting?" (i.e. vertigo)     No 4. SEVERITY: "How bad is it?"  "Do you feel like you are going to faint?" "Can you stand and walk?"   - MILD - walking normally   - MODERATE - interferes with normal activities (e.g., work, school)    - SEVERE - unable to stand, requires support to walk, feels like passing out now.      Moderate 5. ONSET:  "When did the dizziness begin?"     2-3 days 6. AGGRAVATING FACTORS: "Does anything make it worse?" (e.g., standing, change in head position)     I don't really know 7. HEART RATE: "Can you tell me your heart rate?" "How many beats in 15 seconds?"  (Note: not all patients can do this)       No 8. CAUSE: "What do you think is causing the dizziness?"     I don't know 9. RECURRENT SYMPTOM: "Have you had dizziness before?" If so, ask: "When was the last time?" "What  happened that time?"     Nothing like this 10. OTHER SYMPTOMS: "Do you have any other symptoms?" (e.g., fever, chest pain, vomiting, diarrhea, bleeding)       No 11. PREGNANCY: "Is there any chance you are pregnant?" "When was your last menstrual period?"       N/A  Protocols used: DIZZINESS [facility]

## 2019-01-25 ENCOUNTER — Ambulatory Visit: Payer: Medicare HMO | Admitting: Internal Medicine

## 2019-01-25 ENCOUNTER — Encounter: Payer: Self-pay | Admitting: Family Medicine

## 2019-01-25 ENCOUNTER — Ambulatory Visit (INDEPENDENT_AMBULATORY_CARE_PROVIDER_SITE_OTHER): Payer: Medicare HMO | Admitting: Family Medicine

## 2019-01-25 ENCOUNTER — Other Ambulatory Visit: Payer: Self-pay

## 2019-01-25 VITALS — BP 108/60 | HR 78 | Temp 97.8°F | Wt 177.0 lb

## 2019-01-25 DIAGNOSIS — R42 Dizziness and giddiness: Secondary | ICD-10-CM | POA: Diagnosis not present

## 2019-01-25 NOTE — Patient Instructions (Signed)
Dizziness Dizziness is a common problem. It is a feeling of unsteadiness or light-headedness. You may feel like you are about to faint. Dizziness can lead to injury if you stumble or fall. Anyone can become dizzy, but dizziness is more common in older adults. This condition can be caused by a number of things, including medicines, dehydration, or illness. Follow these instructions at home: Eating and drinking  Drink enough fluid to keep your urine clear or pale yellow. This helps to keep you from becoming dehydrated. Try to drink more clear fluids, such as water.  Do not drink alcohol.  Limit your caffeine intake if told to do so by your health care provider. Check ingredients and nutrition facts to see if a food or beverage contains caffeine.  Limit your salt (sodium) intake if told to do so by your health care provider. Check ingredients and nutrition facts to see if a food or beverage contains sodium. Activity  Avoid making quick movements. ? Rise slowly from chairs and steady yourself until you feel okay. ? In the morning, first sit up on the side of the bed. When you feel okay, stand slowly while you hold onto something until you know that your balance is fine.  If you need to stand in one place for a long time, move your legs often. Tighten and relax the muscles in your legs while you are standing.  Do not drive or use heavy machinery if you feel dizzy.  Avoid bending down if you feel dizzy. Place items in your home so that they are easy for you to reach without leaning over. Lifestyle  Do not use any products that contain nicotine or tobacco, such as cigarettes and e-cigarettes. If you need help quitting, ask your health care provider.  Try to reduce your stress level by using methods such as yoga or meditation. Talk with your health care provider if you need help to manage your stress. General instructions  Watch your dizziness for any changes.  Take over-the-counter and  prescription medicines only as told by your health care provider. Talk with your health care provider if you think that your dizziness is caused by a medicine that you are taking.  Tell a friend or a family member that you are feeling dizzy. If he or she notices any changes in your behavior, have this person call your health care provider.  Keep all follow-up visits as told by your health care provider. This is important. Contact a health care provider if:  Your dizziness does not go away.  Your dizziness or light-headedness gets worse.  You feel nauseous.  You have reduced hearing.  You have new symptoms.  You are unsteady on your feet or you feel like the room is spinning. Get help right away if:  You vomit or have diarrhea and are unable to eat or drink anything.  You have problems talking, walking, swallowing, or using your arms, hands, or legs.  You feel generally weak.  You are not thinking clearly or you have trouble forming sentences. It may take a friend or family member to notice this.  You have chest pain, abdominal pain, shortness of breath, or sweating.  Your vision changes.  You have any bleeding.  You have a severe headache.  You have neck pain or a stiff neck.  You have a fever. These symptoms may represent a serious problem that is an emergency. Do not wait to see if the symptoms will go away. Get medical help   right away. Call your local emergency services (911 in the [place] Do not drive yourself to the hospital. Summary  Dizziness is a feeling of unsteadiness or light-headedness. This condition can be caused by a number of things, including medicines, dehydration, or illness.  Anyone can become dizzy, but dizziness is more common in older adults.  Drink enough fluid to keep your urine clear or pale yellow. Do not drink alcohol.  Avoid making quick movements if you feel dizzy. Monitor your dizziness for any changes. This information is not intended to  replace advice given to you by your health care provider. Make sure you discuss any questions you have with your health care provider. Document Released: 04/20/2001 Document Revised: 11/27/2016 Document Reviewed: 11/27/2016 Elsevier Interactive Patient Education  2019 Elsevier Inc.  

## 2019-01-25 NOTE — Progress Notes (Signed)
Subjective:    Patient ID: Kevin [name], male    DOB: November 24, 1945, 73 y.o.   MRN: [id number]  No chief complaint on file. Pt is accompanied by his wife Kevin [name].  HPI Patient was seen today for acute concern.  Patient endorses dizziness on and off x a few days.  Pt endorses recent fall 2/2 dizziness.  Pt was fell 2 days ago when hen missed a step going from the garage to the house.  Pt is unsure if he feels like he is moving or if the room feels like it is spinning.  States bp and fsbs have been normal.  Drinking ~1 quart of water daily and a few cups of coffee each am.  Notes previous h/o dizziness, but episodes becoming more frequent.  Pt denies fever, chills, n/v, cough.  Past Medical History:  Diagnosis Date  . Agent orange exposure    in [place]  . Anemia   . Heart block AV second degree    a. s/p PPM implant with subsequent CRTD upgrade  . High cholesterol   . History of colon polyps   . Hypertension   . LBBB (left bundle branch block)   . Nonischemic cardiomyopathy ([place])    a. MDT CRTD upgrade 2016  . Pacemaker 08/27/2013   Dual-chamber Medtronic Adapta implanted January 2013 for bradycardia with alternating bundle branch block and 2:1 atrioventricular block   . Prostate cancer ([place])    "not been treated yet; on a wait and see" (01/22/2015)  . Type II diabetes mellitus ([facility])     Allergies  Allergen Reactions  . Sulfa Antibiotics Swelling    Swelling of lips only.    ROS General: Denies fever, chills, night sweats, changes in weight, changes in appetite  +dizziness HEENT: Denies headaches, ear pain, changes in vision, rhinorrhea, sore throat CV: Denies CP, palpitations, SOB, orthopnea Pulm: Denies SOB, cough, wheezing GI: Denies abdominal pain, nausea, vomiting, diarrhea, constipation GU: Denies dysuria, hematuria, frequency, vaginal discharge Msk: Denies muscle cramps, joint pains Neuro: Denies weakness, numbness, tingling Skin: Denies rashes, bruising Psych: Denies  depression, anxiety, hallucinations     Objective:    Blood pressure 108/60, pulse 78, temperature 97.8 F (36.6 C), temperature source Oral, weight 177 lb (80.3 kg), SpO2 97 %.  Gen. Pleasant, well-nourished, in no distress, normal affect   HEENT: [place]/AT, face symmetric, no scleral icterus, PERRLA, EOMI, a few beats of nystagmus with horizontal gaze to pt's right.  Nares patent without drainage, TMs normal b/l.  Neck: No JVD, no thyromegaly, no carotid bruits Lungs: no accessory muscle use, CTAB, no wheezes or rales Cardiovascular: RRR, no m/r/g, no peripheral edema Neuro:  A&Ox3, CN II-XII intact, normal gait.  Unbalanced when moving from a seated to standing position. Skin:  Warm, no lesions/ rash  Wt Readings from Last 3 Encounters:  11/07/18 180 lb 12.8 oz (82 kg)  07/24/18 179 lb (81.2 kg)  07/14/18 183 lb (83 kg)    Lab Results  Component Value Date   WBC 8.7 09/10/2016   HGB 15.3 09/10/2016   HCT 44.0 09/10/2016   PLT 205.0 09/10/2016   GLUCOSE 166 (H) 11/07/2018   CHOL 91 07/24/2018   TRIG 107.0 07/24/2018   HDL 30.90 (L) 07/24/2018   LDLDIRECT 58.6 09/24/2014   LDLCALC 38 07/24/2018   ALT 6 07/24/2018   AST 15 07/24/2018   NA 139 11/07/2018   K 4.8 11/07/2018   CL 104 11/07/2018   CREATININE 1.24 11/07/2018  BUN 15 11/07/2018   CO2 30 11/07/2018   TSH 0.56 09/02/2017   HGBA1C 6.1 (A) 11/07/2018   MICROALBUR 2.4 (H) 07/24/2018    Assessment/Plan:  Vertigo  -discussed various causes.  No red flag symptoms -pt encouraged to increase po intake of water, decrease caffeine intake -as pt is already on Ativan 0.5 mg, discussed taking prn to help with dizziness.  Advised may increase fall risk. -safety tips discussed -will place referral for PT for vestibular rehab and to work on balance -given handout - Plan: Ambulatory referral to Physical Therapy  F/u prn  Grier Mitts, MD

## 2019-02-05 ENCOUNTER — Telehealth: Payer: Self-pay | Admitting: *Deleted

## 2019-02-05 NOTE — Telephone Encounter (Signed)
RN called to schedule AWV for patient. No answer. LVM. CRM created

## 2019-02-06 NOTE — Telephone Encounter (Signed)
Pt called and left voicemail on [place].  States he was returning a call.

## 2019-02-12 ENCOUNTER — Ambulatory Visit (INDEPENDENT_AMBULATORY_CARE_PROVIDER_SITE_OTHER): Payer: Medicare HMO | Admitting: Family Medicine

## 2019-02-12 ENCOUNTER — Encounter: Payer: Self-pay | Admitting: Family Medicine

## 2019-02-12 ENCOUNTER — Other Ambulatory Visit: Payer: Self-pay

## 2019-02-12 DIAGNOSIS — Z86010 Personal history of colonic polyps: Secondary | ICD-10-CM | POA: Diagnosis not present

## 2019-02-12 DIAGNOSIS — Z0000 Encounter for general adult medical examination without abnormal findings: Secondary | ICD-10-CM | POA: Diagnosis not present

## 2019-02-12 DIAGNOSIS — Z860101 Personal history of adenomatous and serrated colon polyps: Secondary | ICD-10-CM

## 2019-02-12 NOTE — Telephone Encounter (Signed)
AWV scheduled for today at [facility]

## 2019-02-12 NOTE — Progress Notes (Signed)
Patient ID: Kevin [name], male   DOB: 05/18/1946, 73 y.o.   MRN: [id number]   Virtual Visit via Video Note  I connected with Kevin [name] on 02/12/19 at  2:00 PM EDT by a video enabled telemedicine application and verified that I am speaking with the correct person using two identifiers. We attempted video visit but patient had some technical difficulties.  We had to convert this to audio follow-up  Location patient: home Location provider:work or home office Persons participating in the virtual visit: patient, provider  I discussed the limitations of evaluation and management by telemedicine and the availability of in person appointments. The patient expressed understanding and agreed to proceed.   HPI: Patient was scheduled for Medicare subsequent annual wellness visit. Past medical history reviewed and significant for nonischemic cardiomyopathy, chronic systolic heart failure, hypertension, type 2 diabetes, history of prostate cancer, hyperlipidemia, history of adenomatous colon polyps.  He is followed by several specialists including cardiology, endocrinology, and neurology.  He also is seen at the [facility] health system yearly.  History of agent orange from [place]  He has history of colon polyps and is overdue for colonoscopy.  Last colonoscopy reportedly 2014  1.  Risk factors based on Past Medical , Social, and Family history reviewed and as indicated above with no changes 2.  Limitations in physical activities None.  No recent falls. He has been walking 10 miles per week for exercise and feels great overall 3.  Depression/mood No active depression or anxiety issues 4.  Hearing No defiits 5.  ADLs independent in all. 6.  Cognitive function (orientation to time and place, language, writing, speech,memory) he has history of some cognitive impairment.  MMSE 27/30 back in October 2018 and when he saw a neurologist in September 2019 24/30.  He was started on Aricept at that time.   Language and judgement intact. 7.  Home Safety no issues 8.  Height, weight, and visual acuity.all stable.  He had recent right cataract surgery and vision improved.  He has pending surgery on left cataract 9.  Counseling discussed importance of exercise and healthy diet 10. Recommendation of preventive services.  Needs several things including hepatitis C screen, tetanus booster, Prevnar 13, repeat colonoscopy, shingles vaccine 11. Labs based on risk factors-needs hepatitis B antibody as above 12. Care Plan-as above 13. Other Providers Dr. [name]-cardiology, Dr. Kumar-endocrinology, Dr. Aquino-neurology 14. Written schedule of screening/prevention services given to patient.    ROS: See pertinent positives and negatives per HPI.  Past Medical History:  Diagnosis Date  . Agent [place] exposure    in [place]  . Anemia   . Heart block AV second degree    a. s/p PPM implant with subsequent CRTD upgrade  . High cholesterol   . History of colon polyps   . Hypertension   . LBBB (left bundle branch block)   . Nonischemic cardiomyopathy ([place])    a. MDT CRTD upgrade 2016  . Pacemaker 08/27/2013   Dual-chamber Medtronic Adapta implanted January 2013 for bradycardia with alternating bundle branch block and 2:1 atrioventricular block   . Prostate cancer ([place])    "not been treated yet; on a wait and see" (01/22/2015)  . Type II diabetes mellitus ([place])     Past Surgical History:  Procedure Laterality Date  . BASAL CELL CARCINOMA EXCISION     "back, face, neck"  . BI-VENTRICULAR IMPLANTABLE CARDIOVERTER DEFIBRILLATOR UPGRADE N/A 01/22/2015   MDT CRTD upgrade by Dr Rayann Heman  . CARDIAC CATHETERIZATION  01/27/2012   normal coronaries, dilated LV c/w nonischemic cardiomyopathy  . EXAMINATION UNDER ANESTHESIA  04/06/2012   Procedure: EXAM UNDER ANESTHESIA;  Surgeon: Marcello Moores A. Cornett, MD;  Location: [place];  Service: General;  Laterality: N/A;  . INCISION AND DRAINAGE PERIRECTAL ABSCESS  ~ 2010; 2015   . INGUINAL HERNIA REPAIR Left 1980  . LEFT HEART CATHETERIZATION WITH CORONARY ANGIOGRAM N/A 01/27/2012   Procedure: LEFT HEART CATHETERIZATION WITH CORONARY ANGIOGRAM;  Surgeon: Troy Sine, MD;  Location: [facility] C[facility] LAB;  Service: Cardiovascular;  Laterality: N/A;  . NM MYOCAR PERF WALL MOTION  01/21/2012   mod-severe perfusion defect due to infarct/scar w/mild perinfarct ischemia in the apical,basal inferoseptal,basal inferior,mid inferoseptal,midinferior and apical inferior regions  . PERMANENT PACEMAKER INSERTION N/A 12/01/2011   Medtronic implanted by Dr Sallyanne Kuster  . PROSTATE BIOPSY    . PROSTATE BIOPSY    . shrapnel surgery     "in [place]"  . TONSILLECTOMY      Family History  Problem Relation Age of Onset  . Ovarian cancer Mother   . Leukemia Father   . Heart disease Father   . Diabetes Neg Hx     SOCIAL HX: Quit smoking 1985.  Is now retired.  Does volunteer work in the hospital.   Current Outpatient Medications:  .  ACCU-CHEK COMPACT PLUS test strip, use 1 TEST STRIP once daily as directed, Disp: 102 each, Rfl: 5 .  aspirin 81 MG tablet, Take 81 mg by mouth daily.  , Disp: , Rfl:  .  atorvastatin (LIPITOR) 80 MG tablet, Take 40 mg by mouth daily at 6 PM., Disp: , Rfl:  .  buPROPion (WELLBUTRIN XL) 150 MG 24 hr tablet, TAKE 1 TABLET BY MOUTH EVERY DAY, Disp: 30 tablet, Rfl: 0 .  carvedilol (COREG) 12.5 MG tablet, Take 6.25 mg by mouth 2 (two) times daily with a meal., Disp: , Rfl:  .  CHELATED MAGNESIUM PO, Take 250 mg by mouth daily., Disp: , Rfl:  .  clobetasol ointment (TEMOVATE) 0.73 %, Apply 1 application topically daily as needed (itching). , Disp: , Rfl:  .  Coenzyme Q10 (COQ10) 100 MG CAPS, Take by mouth., Disp: , Rfl:  .  donepezil (ARICEPT) 10 MG tablet, Take 1/2 tablet daily for 2 weeks, then increase to 1 tablet daily, Disp: 30 tablet, Rfl: 11 .  JANUMET XR 50-1000 MG TB24, TAKE 2 TABLETS BY MOUTH DAILY, Disp: 90 tablet, Rfl: 0 .  latanoprost (XALATAN) 0.005 %  ophthalmic solution, Place 1 drop into the right eye daily. , Disp: , Rfl:  .  LORazepam (ATIVAN) 0.5 MG tablet, Take 0.5 mg by mouth every 8 (eight) hours. As needed, Disp: , Rfl:  .  TURMERIC PO, Take 1 tablet by mouth daily., Disp: , Rfl:  .  VIAGRA 100 MG tablet, TAKE ONE TABLET BY MOUTH AS NEEDED FOR ERECTILE DYSFUNCTION., Disp: 10 tablet, Rfl: 3  EXAM:  VITALS per patient if applicable:  GENERAL: alert, oriented, appears well and in no acute distress  HEENT: atraumatic, conjunttiva clear, no obvious abnormalities on inspection of external nose and ears  NECK: normal movements of the head and neck  LUNGS: on inspection no signs of respiratory distress, breathing rate appears normal, no obvious gross SOB, gasping or wheezing  CV: no obvious cyanosis  MS: moves all visible extremities without noticeable abnormality  PSYCH/NEURO: pleasant and cooperative, no obvious depression or anxiety, speech and thought processing grossly intact  ASSESSMENT AND PLAN:  Discussed the following  assessment and plan:  Medicare subsequent annual wellness visit.  We discussed several preventative issues as below -Patient needs hepatitis C antibody and will return after current coronavirus pandemic has slowed down -Needs immunizations including tetanus booster and Prevnar 13.  We also discussed shingles vaccine -Set up repeat colonoscopy     I discussed the assessment and treatment plan with the patient. The patient was provided an opportunity to ask questions and all were answered. The patient agreed with the plan and demonstrated an understanding of the instructions.   The patient was advised to call back or seek an in-person evaluation if the symptoms worsen or if the condition fails to improve as anticipated   Carolann Littler, MD

## 2019-02-19 ENCOUNTER — Ambulatory Visit (INDEPENDENT_AMBULATORY_CARE_PROVIDER_SITE_OTHER): Payer: Medicare HMO | Admitting: *Deleted

## 2019-02-19 ENCOUNTER — Other Ambulatory Visit: Payer: Self-pay

## 2019-02-19 DIAGNOSIS — I441 Atrioventricular block, second degree: Secondary | ICD-10-CM

## 2019-02-19 DIAGNOSIS — I5042 Chronic combined systolic (congestive) and diastolic (congestive) heart failure: Secondary | ICD-10-CM

## 2019-02-19 LAB — CUP PACEART REMOTE DEVICE CHECK
Battery Remaining Longevity: 16 mo
Battery Voltage: 2.89 V
Brady Statistic AP VP Percent: 0.5 %
Brady Statistic AP VS Percent: 0.01 %
Brady Statistic AS VP Percent: 99.35 %
Brady Statistic AS VS Percent: 0.14 %
Brady Statistic RA Percent Paced: 0.51 %
Brady Statistic RV Percent Paced: 99.8 %
Date Time Interrogation Session: 20200413062725
HighPow Impedance: 70 Ohm
Implantable Lead Implant Date: 20130123
Implantable Lead Implant Date: 20160316
Implantable Lead Implant Date: 20160316
Implantable Lead Location: 753858
Implantable Lead Location: 753859
Implantable Lead Location: 753860
Implantable Lead Model: 4598
Implantable Lead Model: 5076
Implantable Lead Model: 6935
Implantable Pulse Generator Implant Date: 20160316
Lead Channel Impedance Value: 1007 Ohm
Lead Channel Impedance Value: 1045 Ohm
Lead Channel Impedance Value: 399 Ohm
Lead Channel Impedance Value: 399 Ohm
Lead Channel Impedance Value: 437 Ohm
Lead Channel Impedance Value: 475 Ohm
Lead Channel Impedance Value: 494 Ohm
Lead Channel Impedance Value: 513 Ohm
Lead Channel Impedance Value: 627 Ohm
Lead Channel Impedance Value: 627 Ohm
Lead Channel Impedance Value: 836 Ohm
Lead Channel Impedance Value: 836 Ohm
Lead Channel Impedance Value: 988 Ohm
Lead Channel Pacing Threshold Amplitude: 0.5 V
Lead Channel Pacing Threshold Amplitude: 0.625 V
Lead Channel Pacing Threshold Amplitude: 2.75 V
Lead Channel Pacing Threshold Pulse Width: 0.4 ms
Lead Channel Pacing Threshold Pulse Width: 0.4 ms
Lead Channel Pacing Threshold Pulse Width: 1 ms
Lead Channel Sensing Intrinsic Amplitude: 5.625 mV
Lead Channel Sensing Intrinsic Amplitude: 5.625 mV
Lead Channel Sensing Intrinsic Amplitude: 7.625 mV
Lead Channel Sensing Intrinsic Amplitude: 7.625 mV
Lead Channel Setting Pacing Amplitude: 2 V
Lead Channel Setting Pacing Amplitude: 2.5 V
Lead Channel Setting Pacing Amplitude: 3.25 V
Lead Channel Setting Pacing Pulse Width: 0.4 ms
Lead Channel Setting Pacing Pulse Width: 1 ms
Lead Channel Setting Sensing Sensitivity: 0.3 mV

## 2019-02-20 ENCOUNTER — Ambulatory Visit (INDEPENDENT_AMBULATORY_CARE_PROVIDER_SITE_OTHER): Payer: Medicare HMO

## 2019-02-20 ENCOUNTER — Other Ambulatory Visit: Payer: Self-pay

## 2019-02-20 DIAGNOSIS — I5042 Chronic combined systolic (congestive) and diastolic (congestive) heart failure: Secondary | ICD-10-CM | POA: Diagnosis not present

## 2019-02-20 DIAGNOSIS — Z95810 Presence of automatic (implantable) cardiac defibrillator: Secondary | ICD-10-CM | POA: Diagnosis not present

## 2019-02-20 NOTE — Progress Notes (Signed)
Are you serious?? A day's serving of salt in a biscuit... MCr

## 2019-02-20 NOTE — Progress Notes (Signed)
EPIC Encounter for ICM Monitoring  Patient Name: DELVIN HEDEEN is a 73 y.o. male Date: 02/20/2019 Primary Care Physican: [name], MD Primary Cardiologist:[name] Electrophysiologist: [name] Bi-V Pacing: 99.7% LastWeight:180 lbs (on 10/28/18 office visit)  Heart failure questions reviewed and he is asymptomatic.  Discussed diet and he is eating country ham biscuits from [employer] and [employer].  Reviewed nutrition of [employer] country ham biscuits has 1570 mg of sodium.   Thoracic impedanceabnormal suggesting fluid accumulation starting 02/04/2019.  Labs: 11/07/2018 Creatinine 1.24, BUN 15, Potassium 4.8, Sodium 139, GFR 60.74 07/24/2018 Creatinine 1.12, BUN 13, Potassium 4.2, Sodium 139, GFR 68.37 02/02/2017 Creatinine 1.34, BUN 20, Potassium 4.1, Sodium 137, GFR 55.82  Recommendations:Advised to avoid restaurant foods that are high in salt.  He said he will cut back on salt intake.  Advised to limit to 2000 mg daily.   Follow up plan: ICM clinic phone appointment on4/20/2020 to recheck fluid levels.   Copy of ICM check sent to Dr.[name].  3 month ICM trend: 02/20/2019    1 Year ICM trend:       Rosalene Billings, RN 02/20/2019 4:27 PM

## 2019-02-21 ENCOUNTER — Telehealth: Payer: Self-pay

## 2019-02-21 NOTE — Telephone Encounter (Signed)
Error

## 2019-02-21 NOTE — Progress Notes (Signed)
Probably a prevalent attitude in the [place]... MCr

## 2019-02-26 ENCOUNTER — Other Ambulatory Visit: Payer: Self-pay

## 2019-02-26 ENCOUNTER — Telehealth: Payer: Self-pay

## 2019-02-26 ENCOUNTER — Ambulatory Visit (INDEPENDENT_AMBULATORY_CARE_PROVIDER_SITE_OTHER): Payer: Medicare HMO

## 2019-02-26 DIAGNOSIS — I5042 Chronic combined systolic (congestive) and diastolic (congestive) heart failure: Secondary | ICD-10-CM

## 2019-02-26 DIAGNOSIS — Z95810 Presence of automatic (implantable) cardiac defibrillator: Secondary | ICD-10-CM

## 2019-02-26 NOTE — Progress Notes (Signed)
Remote ICD transmission.   

## 2019-02-26 NOTE — Progress Notes (Signed)
EPIC Encounter for ICM Monitoring  Patient Name: RAMZEY PETROVIC is a 73 y.o. male Date: 02/26/2019 Primary Care Physican: [name], MD Primary Cardiologist:[name] Electrophysiologist: [name] Bi-V Pacing: 99.9% LastWeight:180lbs (on 12/21/19office visit) 02/26/2019 Weight: 181 lbs  Heart failure questions reviewed and he is asymptomatic.  He said he has stopped eating country ham biscuits in the last week and explained which has helped improve Optivol reading.   Encouraged him to review other foods he is eating for salt content.  Thoracic impedanceimproving since 02/20/2019 transmission.  Labs: 11/07/2018 Creatinine 1.24, BUN 15, Potassium 4.8, Sodium 139, GFR 60.74 07/24/2018 Creatinine 1.12, BUN 13, Potassium 4.2, Sodium 139, GFR 68.37 02/02/2017 Creatinine 1.34, BUN 20, Potassium 4.1, Sodium 137, GFR 55.82  Recommendations:No changes and advised to limit to 2000 mg daily.   Follow up plan: ICM clinic phone appointment on4/27/2020 to recheck fluid levels.   Copy of ICM check sent to Dr.[name].  3 month ICM trend: 02/26/2019    1 Year ICM trend:       Rosalene Billings, RN 02/26/2019 1:44 PM

## 2019-02-27 ENCOUNTER — Other Ambulatory Visit: Payer: Self-pay

## 2019-02-27 ENCOUNTER — Other Ambulatory Visit (INDEPENDENT_AMBULATORY_CARE_PROVIDER_SITE_OTHER): Payer: Medicare HMO

## 2019-02-27 DIAGNOSIS — E1165 Type 2 diabetes mellitus with hyperglycemia: Secondary | ICD-10-CM | POA: Diagnosis not present

## 2019-02-27 LAB — COMPREHENSIVE METABOLIC PANEL
ALT: 11 U/L (ref 0–53)
AST: 18 U/L (ref 0–37)
Albumin: 4.1 g/dL (ref 3.5–5.2)
Alkaline Phosphatase: 63 U/L (ref 39–117)
BUN: 15 mg/dL (ref 6–23)
CO2: 32 mEq/L (ref 19–32)
Calcium: 9.6 mg/dL (ref 8.4–10.5)
Chloride: 103 mEq/L (ref 96–112)
Creatinine, Ser: 1.12 mg/dL (ref 0.40–1.50)
GFR: 64.22 mL/min (ref >60.00)
Glucose, Bld: 140 mg/dL — ABNORMAL HIGH (ref 70–99)
Potassium: 4.6 mEq/L (ref 3.5–5.1)
Sodium: 140 mEq/L (ref 135–145)
Total Bilirubin: 0.7 mg/dL (ref 0.2–1.2)
Total Protein: 7.1 g/dL (ref 6.0–8.3)

## 2019-02-27 LAB — HEMOGLOBIN A1C: Hgb A1c MFr Bld: 6.6 % — ABNORMAL HIGH (ref 4.6–6.5)

## 2019-03-02 ENCOUNTER — Other Ambulatory Visit: Payer: Self-pay

## 2019-03-02 ENCOUNTER — Ambulatory Visit (INDEPENDENT_AMBULATORY_CARE_PROVIDER_SITE_OTHER): Payer: Medicare HMO | Admitting: Endocrinology

## 2019-03-02 ENCOUNTER — Encounter: Payer: Self-pay | Admitting: Endocrinology

## 2019-03-02 DIAGNOSIS — E1165 Type 2 diabetes mellitus with hyperglycemia: Secondary | ICD-10-CM

## 2019-03-02 DIAGNOSIS — E7800 Pure hypercholesterolemia, unspecified: Secondary | ICD-10-CM

## 2019-03-02 MED ORDER — SITAGLIP PHOS-METFORMIN HCL ER 100-1000 MG PO TB24: 1.0000 | ORAL_TABLET | Freq: Every day | ORAL | 4 refills | Status: DC

## 2019-03-02 NOTE — Progress Notes (Signed)
Patient ID: Kevin [name], male   DOB: [birth year], 73 y.o.   MRN: [id number]    Reason for Appointment:  Followup for Type 2 Diabetes   Today's office visit was provided via telemedicine using video technique Explained to the patient and the the limitations of evaluation and management by telemedicine and the availability of in person appointments.  The patient understood the limitations and agreed to proceed. Patient also understood that the telehealth visit is billable.  Location of the patient: Home  Location of the provider: Office Only the patient and myself were participating in the encounter    History of Present Illness:          Diagnosis: Type 2 diabetes mellitus, date of diagnosis: 2001        Past history: His previous records are not available but initially his symptoms included weight loss He thinks he has been on metformin for several years and more recently has been taking 1000 mg twice a day [facility] His A1c was 6.9 in 2013 but a year later was 8.1 He had been on glipizide 5 mg twice a day in addition to his metformin 1 g twice a day   Prior to his consultation when his A1c was 10% and he was given Januvia in addition  Recent history:   Oral hypoglycemic drugs the patient is taking PXT:GGYIRSW XR 50/1000 1 tablet daily  His A1c is 6.6 and consistently under 7  Current management, blood sugar patterns and problems identified:   Although he was recommended taking the 100/1000 Janumet XR on his last visit not clear why he does not have this prescription.  Currently not getting medication from the [facility]  He is very reluctant to check his blood sugars and has only 1 reading of 151 midday done 2 days ago  He does try to do some walking on treadmill or outside as much as 2 miles at a time  He does not think he has had any change in his weight which is still about 180  Usually trying  to watch diet with healthier meals encouraged by his wife       Side effects from medications have been: None  Glucose monitoring:  done 1 or less times a day         Glucometer:  Accu-Chek ?     Blood Glucose readings as above   Glycemic control:    Lab Results  Component Value Date   HGBA1C 6.6 (H) 02/27/2019   HGBA1C 6.1 (A) 11/07/2018   HGBA1C 6.3 (A) 07/24/2018   Lab Results  Component Value Date   MICROALBUR 2.4 (H) 07/24/2018   LDLCALC 38 07/24/2018   CREATININE 1.12 02/27/2019     Self-care: The diet that the patient has been following is: tries to limit portions, may get fried food when eating out, some fast food    Meals: 3 meals per day.  breakfast is at 7-8 am and is usually eating a biscuit or special K. cereal Usually eating baked chicken or chicken salad sandwiches for meals and will have fruits for snacks          Exercise:  walks up to 10 miles a week usually      Dietician visit: Most recent:2000              Weight history:  Wt Readings from Last 3 Encounters:  01/25/19 177 lb (80.3 kg)  11/07/18  180 lb 12.8 oz (82 kg)  07/24/18 179 lb (81.2 kg)    OTHER active problems: See Review of systems  Lab on 02/27/2019  Component Date Value Ref Range Status   Sodium 02/27/2019 140  135 - 145 mEq/L Final   Potassium 02/27/2019 4.6  3.5 - 5.1 mEq/L Final   Chloride 02/27/2019 103  96 - 112 mEq/L Final   CO2 02/27/2019 32  19 - 32 mEq/L Final   Glucose, Bld 02/27/2019 140* 70 - 99 mg/dL Final   BUN 02/27/2019 15  6 - 23 mg/dL Final   Creatinine, Ser 02/27/2019 1.12  0.40 - 1.50 mg/dL Final   Total Bilirubin 02/27/2019 0.7  0.2 - 1.2 mg/dL Final   Alkaline Phosphatase 02/27/2019 63  39 - 117 U/L Final   AST 02/27/2019 18  0 - 37 U/L Final   ALT 02/27/2019 11  0 - 53 U/L Final   Total Protein 02/27/2019 7.1  6.0 - 8.3 g/dL Final   Albumin 02/27/2019 4.1  3.5 - 5.2 g/dL Final   Calcium 02/27/2019 9.6  8.4 - 10.5 mg/dL Final   GFR  02/27/2019 64.22  >60.00 mL/min Final   Hgb A1c MFr Bld 02/27/2019 6.6* 4.6 - 6.5 % Final   Glycemic Control Guidelines for People with Diabetes:Non Diabetic:  <6%Goal of Therapy: <7%Additional Action Suggested:  >8%     Allergies as of 03/02/2019      Reactions   Sulfa Antibiotics Swelling   Swelling of lips only.      Medication List       Accurate as of March 02, 2019  9:13 AM. Always use your most recent med list.        Accu-Chek Compact Plus test strip Generic drug:  glucose blood use 1 TEST STRIP once daily as directed   aspirin 81 MG tablet Take 81 mg by mouth daily.   atorvastatin 80 MG tablet Commonly known as:  LIPITOR Take 40 mg by mouth daily at 6 PM.   buPROPion 150 MG 24 hr tablet Commonly known as:  WELLBUTRIN XL TAKE 1 TABLET BY MOUTH EVERY DAY   carvedilol 12.5 MG tablet Commonly known as:  COREG Take 6.25 mg by mouth 2 (two) times daily with a meal.   CHELATED MAGNESIUM PO Take 250 mg by mouth daily.   clobetasol ointment 0.05 % Commonly known as:  TEMOVATE Apply 1 application topically daily as needed (itching).   CoQ10 100 MG Caps Take by mouth.   donepezil 10 MG tablet Commonly known as:  ARICEPT Take 1/2 tablet daily for 2 weeks, then increase to 1 tablet daily   Janumet XR 50-1000 MG Tb24 Generic drug:  SitaGLIPtin-MetFORMIN HCl TAKE 2 TABLETS BY MOUTH DAILY   latanoprost 0.005 % ophthalmic solution Commonly known as:  XALATAN Place 1 drop into the right eye daily.   LORazepam 0.5 MG tablet Commonly known as:  ATIVAN Take 0.5 mg by mouth every 8 (eight) hours. As needed   TURMERIC PO Take 1 tablet by mouth daily.   Viagra 100 MG tablet Generic drug:  sildenafil TAKE ONE TABLET BY MOUTH AS NEEDED FOR ERECTILE DYSFUNCTION.       Allergies:  Allergies  Allergen Reactions   Sulfa Antibiotics Swelling    Swelling of lips only.    Past Medical History:  Diagnosis Date   Agent orange exposure    in [place]    Anemia    Heart block AV second degree  a. s/p PPM implant with subsequent CRTD upgrade   High cholesterol    History of colon polyps    Hypertension    LBBB (left bundle branch block)    Nonischemic cardiomyopathy ([place])    a. MDT CRTD upgrade 2016   Pacemaker 08/27/2013   Dual-chamber Medtronic Adapta implanted January 2013 for bradycardia with alternating bundle branch block and 2:1 atrioventricular block    Prostate cancer ([place])    "not been treated yet; on a wait and see" (01/22/2015)   Type II diabetes mellitus ([place])     Past Surgical History:  Procedure Laterality Date   BASAL CELL CARCINOMA EXCISION     "back, face, neck"   BI-VENTRICULAR IMPLANTABLE CARDIOVERTER DEFIBRILLATOR UPGRADE N/A 01/22/2015   MDT CRTD upgrade by Dr Rayann Heman   CARDIAC CATHETERIZATION  01/27/2012   normal coronaries, dilated LV c/w nonischemic cardiomyopathy   EXAMINATION UNDER ANESTHESIA  04/06/2012   Procedure: EXAM UNDER ANESTHESIA;  Surgeon: Marcello Moores A. Cornett, MD;  Location: [place];  Service: General;  Laterality: N/A;   INCISION AND DRAINAGE PERIRECTAL ABSCESS  ~ 2010; 2015   INGUINAL HERNIA REPAIR Left 1980   LEFT HEART CATHETERIZATION WITH CORONARY ANGIOGRAM N/A 01/27/2012   Procedure: LEFT HEART CATHETERIZATION WITH CORONARY ANGIOGRAM;  Surgeon: Troy Sine, MD;  [facility];  Service: Cardiovascular;  Laterality: N/A;   NM MYOCAR PERF WALL MOTION  01/21/2012   mod-severe perfusion defect due to infarct/scar w/mild perinfarct ischemia in the apical,basal inferoseptal,basal inferior,mid inferoseptal,midinferior and apical inferior regions   PERMANENT PACEMAKER INSERTION N/A 12/01/2011   Medtronic implanted by Dr Sallyanne Kuster   PROSTATE BIOPSY     PROSTATE BIOPSY     shrapnel surgery     "in [place]"   TONSILLECTOMY      Family History  Problem Relation Age of Onset   Ovarian cancer Mother    Leukemia Father    Heart disease Father    Diabetes Neg Hx      Social History:  reports that he quit smoking about 34 years ago. His smoking use included cigarettes. He has a 20.00 pack-year smoking history. He has never used smokeless tobacco. He reports current alcohol use. He reports that he does not use drugs.    Review of Systems  .   He has eye exams every 6 months regularly at the [facility], again no records available No difficulties with vision      Lipids: He has been treated with Lipitor 80 mg, this is also [facility] by [facility]       Lab Results  Component Value Date   CHOL 91 07/24/2018   HDL 30.90 (L) 07/24/2018   LDLCALC 38 07/24/2018   LDLDIRECT 58.6 09/24/2014   TRIG 107.0 07/24/2018   CHOLHDL 3 07/24/2018     Hypertension with history of CHF: Mild and has been controlled with carvedilol  Also taking Lasix and followed by cardiologist       Physical Examination:    There were no vitals taken for this visit.  ASSESSMENT/PLAN:     Diabetes type 2, Nonobese  See history of present illness for detailed discussion of current diabetes management, blood sugar patterns and problems identified     His A1c is relatively higher at 6.6, previously was 6.1  This is with taking 50/1000 Janumet He may be getting some postprandial hyperglycemia but monitoring at home is infrequent He does appear to be doing well with his regular walking and mostly watching his  diet  He likely will have better control including better postprandial readings with 100 mg of Januvia Will be given a new prescription for Janumet XR 100/1001 tablet daily  Encourage regular eye exams for retinopathy  Hypertension: To continue follow-up with cardiologist  There are no Patient Instructions on file for this visit.  Elayne Snare 03/02/2019, 9:13 AM      Note: This office note was prepared with Dragon voice recognition system technology. Any transcriptional errors that result from this process are unintentional.

## 2019-03-05 ENCOUNTER — Telehealth: Payer: Self-pay

## 2019-03-05 ENCOUNTER — Encounter: Payer: Self-pay | Admitting: Neurology

## 2019-03-05 ENCOUNTER — Telehealth (INDEPENDENT_AMBULATORY_CARE_PROVIDER_SITE_OTHER): Payer: Medicare HMO | Admitting: Neurology

## 2019-03-05 ENCOUNTER — Other Ambulatory Visit: Payer: Self-pay

## 2019-03-05 ENCOUNTER — Ambulatory Visit (INDEPENDENT_AMBULATORY_CARE_PROVIDER_SITE_OTHER): Payer: Medicare HMO

## 2019-03-05 VITALS — BP 138/73 | HR 80

## 2019-03-05 DIAGNOSIS — R42 Dizziness and giddiness: Secondary | ICD-10-CM

## 2019-03-05 DIAGNOSIS — Z95810 Presence of automatic (implantable) cardiac defibrillator: Secondary | ICD-10-CM

## 2019-03-05 DIAGNOSIS — G3184 Mild cognitive impairment, so stated: Secondary | ICD-10-CM | POA: Diagnosis not present

## 2019-03-05 DIAGNOSIS — I5042 Chronic combined systolic (congestive) and diastolic (congestive) heart failure: Secondary | ICD-10-CM

## 2019-03-05 MED ORDER — DONEPEZIL HCL 10 MG PO TABS: ORAL_TABLET | ORAL | 11 refills | Status: DC

## 2019-03-05 MED ORDER — DONEPEZIL HCL 5 MG PO TABS: ORAL_TABLET | ORAL | 11 refills | Status: DC

## 2019-03-05 MED ORDER — BUPROPION HCL ER (XL) 150 MG PO TB24: 150.0000 mg | ORAL_TABLET | Freq: Every day | ORAL | 0 refills | Status: DC

## 2019-03-05 NOTE — Progress Notes (Signed)
Virtual Visit via Video Note The purpose of this virtual visit is to provide medical care while limiting exposure to the novel coronavirus.    Consent was obtained for video visit:  Yes.   Answered questions that patient had about telehealth interaction:  Yes.   I discussed the limitations, risks, security and privacy concerns of performing an evaluation and management service by telemedicine. I also discussed with the patient that there may be a patient responsible charge related to this service. The patient expressed understanding and agreed to proceed.  Pt location: Home Physician Location: office Name of referring provider:  [name], MD I connected with Ferdinand Cava at patients initiation/request on 03/05/2019 at 11:00 AM EDT by video enabled telemedicine application and verified that I am speaking with the correct person using two identifiers. Pt MRN:  [id number] Pt DOB:  [birth year] Video Participants:  Ferdinand Cava;  [name] Damiani (wife)   History of Present Illness:  The patient was last seen in September 2019 for worsening memory and dizziness. His wife is present during the e-visit to provide additional information. MOCA in September 2020 was 24/30. I personally reviewed head CT without contrast which did not show any acute changes, there was moderate diffuse atrophy with mild lateral ventricle atriaum prominence which may be ex vacuo. Since his last visit, he and his wife think he is doing fine, his wife thinks he is probably a little better. He still has difficulty remembering and would get frustrated with himself, worsening his symptoms. Sometimes he gets confused but not very often at all when driving. He denies getting lost driving, he uses his GPS all the time. He and his wife do bills together. His wife fixes his pillbox and he is pretty good with remembering to take medications. He was prescribed Donepezil 10mg  on his last visit, it appears he had nausea and  more dizziness on 10mg  dose and discontinued medication. He has ran out of Wellbutrin for a couple of months, his wife thinks he needs to be on it and they have called his [facility] awaiting reply. He still has occasional dizzy spells, usually when he gets up losing his balance a little. He denies any falls. He walks 2 miles daily and does not have any problems/dizziness with this. He had a hearing test 3-4 months ago and tried hearing aids that significantly helped with his hearing and balance. He has had continuous tinnitus for several years. He is hoping he gets the same hearing aids through the [place]. He reports an episode the other day, he came out of the store and got in his car, the next thing he knew he was looking at the windshield and seeing double. He is unable to say if it was vertical or horizontal diplopia, he could read the odometer but far vision caused dizziness. This lasted a few minutes with no other symptoms such as headaches or focal numbness/tingling/weakness, then he was able to drive home. He is scheduled for another eye surgery in the future where he was told he would not need his glasses. 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   History on Initial Assessment 07/14/2018: This is a pleasant 73 year old right-handed man with a history of hypertension, hyperlipidemia, diabetes, prostrate  cancer, NICM, second degree AV block s/p PPM, presenting for evaluation of memory changes and dizziness. He and his wife disagree several times during the visit about what each other says. His wife reports dizziness started 2 years ago and that he complains about it daily to her. He states he does not have it everyday and does not think it's been going on for 2 years. It only occurs when standing, he is fine sitting/supine.  Dizziness is described as lightheadedness, he used to play golf but stopped playing because he would transiently feel dizzy. He then reports that he tried to play golf 4-5 weeks ago and did not feel dizzy at all. His wife has checked his BP and glucose during these times and found them normal. He feels that when he takes his glasses off, the dizziness stops. He had a different type of dizziness the other night when he bent down to brush his teeth and started having a spinning sensation like he would fall. His wife helped him to sit on the commode and her threw up 3 times. Vertigo lasted 5 minutes. No associated focal symptoms. He fell yesterday but states this is not a regular thing. He denies any headaches, diplopia, dysarthria/dysphagia, neck/back pain, focal numbness/tingling/weakness, bowel/bladder dysfunction, anosmia, or tremors. He has tinnitus and has seen ENT, he reports that chelated magnesium and turmeric help.  When asked about memory, he reports he gets frustrated trying to remember names, then it eventually comes to him. His wife started noticing changes 2 years ago where he would repeat himself. She will be going back to work and the other day he asked her at least 5 times if she was working that day. He misplaces things frequently, having a bill or his checkbook and unable to recall where he lay it down. He volunteers at [facility] and could not remember someone's name yesterday. He continues to drive and denies getting lost, but his wife shakes her head and states he would be driving and forget how to get to his destination, which frustrates him since he has lived in [place] for a long time. They manage bills together, his wife denies any difficulties. His wife fixes his pillbox, he takes his medications overall fine but sometimes puts them in a packet and takes it later on. He is independent with dressing and bathing. His wife has noticed increased irritability, they have been married for 5 years.  No paranoia or hallucinations. No wandering behavior, he sleeps well with melatonin. He takes prn Ativan only when "really frustrated." He is a retired Cytogeneticist. No family history of dementia. He was knocked down by shrapnel in [place]. He very seldom drinks alcohol. MMSE 27/30 in October 2018.  Observations/Objective:   Vitals:   03/05/19 1050  BP: 138/73  Pulse: 80   Patient is awake, alert, oriented x 3. No aphasia or dysarthria. Intact fluency and comprehension. Remote and recent memory impaired. Able to name and repeat. Cranial nerves: Extraocular movements intact with no nystagmus. No facial asymmetry. Motor: moves all extremities symmetrically. No incoordination on finger to nose testing. Gait: narrow-based and steady, able to tandem walk adequately. Negative Romberg test.  Montreal Cognitive Assessment  03/05/2019 07/14/2018  Visuospatial/ Executive (0/5) 2 4  Naming (0/3) 2 3  Attention: Read list of digits (0/2) 2 2  Attention: Read list of letters (0/1) 1 1  Attention: Serial 7 subtraction starting at 100 (0/3) 2 3  Language: Repeat phrase (0/2) 2 2  Language : Fluency (0/1)  0 1  Abstraction (0/2) 1 2  Delayed Recall (0/5) 0 0  Orientation (0/6) 5 6  Total 17 24   Assessment and Plan:   This is a pleasant 73 yo RH man with a history of  hypertension, hyperlipidemia, diabetes, prostrate cancer, NICM, second degree AV block s/p PPM, with worsening memory and dizziness. Prior exam had shown mild length-dependent neuropathy which can cause a sensation of dizziness upon standing. Head CT no acute changes. MOCA score today 17/30 (24/30 in September 2019). He had side effects on 10mg  dose of Donepezil, we agreed to restart Donepezil at a lower dose 5mg  1/2 tab daily for 2 weeks, then increase to 1 tab daily. It appears he is still able to manage complex tasks independently despite decline in [facility] testing, indicating Mild Cognitive Impairment. Continue to monitor driving. He reports a  transient episode of blurred far vision, no other symptoms, continue to monitor, they know to go to the ER immediately for any sudden change in symptoms. Continue control of vascular risk factors. His wife reports more frustration with forgetfulness, he has been off Wellbutrin, a 25-month supply will be sent in until he gets refills from his [facility]. He will follow-up in 6 months and knows to call for any changes.   Follow Up Instructions:   -I discussed the assessment and treatment plan with the patient. The patient was provided an opportunity to ask questions and all were answered. The patient agreed with the plan and demonstrated an understanding of the instructions.   The patient was advised to call back or seek an in-person evaluation if the symptoms worsen or if the condition fails to improve as anticipated.    Total Time spent in visit with the patient was 30 minutes, of which more than 50% of the time was spent in counseling and/or coordinating care on the above.   Pt understands and agrees with the plan of care outlined.     Cameron Sprang, MD

## 2019-03-05 NOTE — Telephone Encounter (Signed)
Left message for patient to remind of missed remote transmission.  

## 2019-03-06 NOTE — Progress Notes (Signed)
EPIC Encounter for ICM Monitoring  Patient Name: Kevin [name] is a 73 y.o. male Date: 03/06/2019 Primary Care Physican: [name], MD Primary Cardiologist:[name] Electrophysiologist: [name] Bi-V Pacing: 99.8% LastWeight:180lbs (on 12/21/19office visit) 02/26/2019 Weight: 181 lbs 03/06/2019 Weight: 181 lbs  Heart failre questions reviewed and he is asymptomatic. Pt reports he gave up ham biscuits but does still eat take out food which I explained is still high in salt.  Advised to fix meals at home if possible since he can control the salt better.   Optivol Thoracic impedanceremains decreased suggestive of fluid accumulation but is trending toward baseline.  Labs: 11/07/2018 Creatinine 1.24, BUN 15, Potassium 4.8, Sodium 139, GFR 60.74 07/24/2018 Creatinine 1.12, BUN 13, Potassium 4.2, Sodium 139, GFR 68.37 02/02/2017 Creatinine 1.34, BUN 20, Potassium 4.1, Sodium 137, GFR 55.82  Recommendations:No changes and advised to limit to 2000 mg daily.   Follow up plan: ICM clinic phone appointment on5/18/2020.   Copy of ICM check sent to Dr.[name] and Dr Sallyanne Kuster.  3 month ICM trend: 03/05/2019    1 Year ICM trend:      Rosalene Billings, RN 03/06/2019 7:50 AM

## 2019-03-06 NOTE — Progress Notes (Signed)
Oh, boy... Just keep educatin', I guess MCr

## 2019-03-07 NOTE — Telephone Encounter (Signed)
Opening error 

## 2019-03-09 ENCOUNTER — Encounter

## 2019-03-09 ENCOUNTER — Ambulatory Visit: Payer: Non-veteran care | Admitting: Neurology

## 2019-03-26 ENCOUNTER — Other Ambulatory Visit: Payer: Self-pay

## 2019-03-26 ENCOUNTER — Ambulatory Visit (INDEPENDENT_AMBULATORY_CARE_PROVIDER_SITE_OTHER): Payer: Medicare HMO

## 2019-03-26 DIAGNOSIS — I5042 Chronic combined systolic (congestive) and diastolic (congestive) heart failure: Secondary | ICD-10-CM

## 2019-03-26 DIAGNOSIS — Z95810 Presence of automatic (implantable) cardiac defibrillator: Secondary | ICD-10-CM | POA: Diagnosis not present

## 2019-03-27 NOTE — Progress Notes (Signed)
EPIC Encounter for ICM Monitoring  Patient Name: Kevin [name] is a 73 y.o. male Date: 03/27/2019 Primary Care Physican: [name], MD Primary Cardiologist:[name] Electrophysiologist: [name] Bi-V Pacing: 99.9% 02/26/2019 Weight:181lbs 03/06/2019 Weight: 181 lbs 03/27/2019 Weight: 178 lbs  Heart failre questions reviewed and he is asymptomatic. He is feeling fine and denies any fluid symptoms.  Optivol Thoracic impedance returned to normal since 03/05/2019 transmission but has started to suggest the return of fluid accumulation 03/20/2019.  Labs: 11/07/2018 Creatinine 1.24, BUN 15, Potassium 4.8, Sodium 139, GFR 60.74 07/24/2018 Creatinine 1.12, BUN 13, Potassium 4.2, Sodium 139, GFR 68.37 02/02/2017 Creatinine 1.34, BUN 20, Potassium 4.1, Sodium 137, GFR 55.82  Patient does not take a diuretic.  Recommendations:  Advised to limit to 2000 mg daily.   Follow up plan: ICM clinic phone appointment on5/26/2020 to recheck fluid levels.   Copy of ICM check sent to Dr.[name] and Dr Sallyanne Kuster.  3 month ICM trend: 03/26/2019    1 Year ICM trend:       Rosalene Billings, RN 03/27/2019 9:13 AM

## 2019-04-03 ENCOUNTER — Other Ambulatory Visit: Payer: Self-pay

## 2019-04-03 ENCOUNTER — Ambulatory Visit (INDEPENDENT_AMBULATORY_CARE_PROVIDER_SITE_OTHER): Payer: Medicare HMO

## 2019-04-03 DIAGNOSIS — Z95810 Presence of automatic (implantable) cardiac defibrillator: Secondary | ICD-10-CM

## 2019-04-03 DIAGNOSIS — I5042 Chronic combined systolic (congestive) and diastolic (congestive) heart failure: Secondary | ICD-10-CM

## 2019-04-03 NOTE — Progress Notes (Signed)
EPIC Encounter for ICM Monitoring  Patient Name: Kevin [name] is a 73 y.o. male Date: 04/03/2019 Primary Care Physican: [name], MD Primary Cardiologist:[name] Electrophysiologist: [name] Bi-V Pacing: 99.9% 03/06/2019 Weight:181lbs 03/27/2019 Weight: 178 lbs   Heart failure questions reviewed and he is asymptomatic. He is feeling fine and denies any fluid symptoms.  OptivolThoracic impedance returned to normal.  Labs: 11/07/2018 Creatinine 1.24, BUN 15, Potassium 4.8, Sodium 139, GFR 60.74 07/24/2018 Creatinine 1.12, BUN 13, Potassium 4.2, Sodium 139, GFR 68.37 02/02/2017 Creatinine 1.34, BUN 20, Potassium 4.1, Sodium 137, GFR 55.82  Patient does not take a diuretic.  Recommendations:  Advised to limit to 2000 mg daily.   Follow up plan: ICM clinic phone appointment on6/22/2020.  Copy of ICM check sent to Dr.[name].  3 month ICM trend: 04/03/2019    1 Year ICM trend:       Rosalene Billings, RN 04/03/2019 1:42 PM

## 2019-04-30 ENCOUNTER — Ambulatory Visit (INDEPENDENT_AMBULATORY_CARE_PROVIDER_SITE_OTHER): Payer: Medicare HMO

## 2019-04-30 DIAGNOSIS — I5042 Chronic combined systolic (congestive) and diastolic (congestive) heart failure: Secondary | ICD-10-CM | POA: Diagnosis not present

## 2019-04-30 DIAGNOSIS — Z95810 Presence of automatic (implantable) cardiac defibrillator: Secondary | ICD-10-CM | POA: Diagnosis not present

## 2019-05-01 NOTE — Progress Notes (Signed)
EPIC Encounter for ICM Monitoring  Patient Name: Kevin [name] is a 72 y.o. male Date: 05/01/2019 Primary Care Physican: [name], MD Primary Cardiologist:[name] Electrophysiologist: [name] Bi-V Pacing: 99.9% 03/06/2019 Weight:181lbs 03/27/2019 Weight:178lbs   Transmission results sent via mychart  OptivolThoracic impedance normal.  Labs: 11/07/2018 Creatinine 1.24, BUN 15, Potassium 4.8, Sodium 139, GFR 60.74 07/24/2018 Creatinine 1.12, BUN 13, Potassium 4.2, Sodium 139, GFR 68.37 02/02/2017 Creatinine 1.34, BUN 20, Potassium 4.1, Sodium 137, GFR 55.82  Patient does not take a diuretic.  Recommendations:None  Follow up plan: ICM clinic phone appointment on7/27/2020.  Copy of ICM check sent to Dr.[name].   3 month ICM trend: 05/01/2019    1 Year ICM trend:       Rosalene Billings, RN 05/01/2019 4:28 PM

## 2019-05-21 ENCOUNTER — Ambulatory Visit (INDEPENDENT_AMBULATORY_CARE_PROVIDER_SITE_OTHER): Payer: Medicare HMO | Admitting: *Deleted

## 2019-05-21 DIAGNOSIS — I495 Sick sinus syndrome: Secondary | ICD-10-CM

## 2019-05-21 DIAGNOSIS — I441 Atrioventricular block, second degree: Secondary | ICD-10-CM

## 2019-05-21 LAB — CUP PACEART REMOTE DEVICE CHECK
Battery Remaining Longevity: 15 mo
Battery Voltage: 2.89 V
Brady Statistic AP VP Percent: 0.04 %
Brady Statistic AP VS Percent: 0.01 %
Brady Statistic AS VP Percent: 99.87 %
Brady Statistic AS VS Percent: 0.08 %
Brady Statistic RA Percent Paced: 0.05 %
Brady Statistic RV Percent Paced: 99.91 %
Date Time Interrogation Session: 20200713062604
HighPow Impedance: 75 Ohm
Implantable Lead Implant Date: 20130123
Implantable Lead Implant Date: 20160316
Implantable Lead Implant Date: 20160316
Implantable Lead Location: 753858
Implantable Lead Location: 753859
Implantable Lead Location: 753860
Implantable Lead Model: 4598
Implantable Lead Model: 5076
Implantable Lead Model: 6935
Implantable Pulse Generator Implant Date: 20160316
Lead Channel Impedance Value: 1045 Ohm
Lead Channel Impedance Value: 1083 Ohm
Lead Channel Impedance Value: 1121 Ohm
Lead Channel Impedance Value: 399 Ohm
Lead Channel Impedance Value: 418 Ohm
Lead Channel Impedance Value: 475 Ohm
Lead Channel Impedance Value: 494 Ohm
Lead Channel Impedance Value: 551 Ohm
Lead Channel Impedance Value: 551 Ohm
Lead Channel Impedance Value: 627 Ohm
Lead Channel Impedance Value: 703 Ohm
Lead Channel Impedance Value: 855 Ohm
Lead Channel Impedance Value: 912 Ohm
Lead Channel Pacing Threshold Amplitude: 0.5 V
Lead Channel Pacing Threshold Amplitude: 0.5 V
Lead Channel Pacing Threshold Amplitude: 2.5 V
Lead Channel Pacing Threshold Pulse Width: 0.4 ms
Lead Channel Pacing Threshold Pulse Width: 0.4 ms
Lead Channel Pacing Threshold Pulse Width: 1 ms
Lead Channel Sensing Intrinsic Amplitude: 3.875 mV
Lead Channel Sensing Intrinsic Amplitude: 3.875 mV
Lead Channel Sensing Intrinsic Amplitude: 8.375 mV
Lead Channel Sensing Intrinsic Amplitude: 8.375 mV
Lead Channel Setting Pacing Amplitude: 2 V
Lead Channel Setting Pacing Amplitude: 2.5 V
Lead Channel Setting Pacing Amplitude: 3.25 V
Lead Channel Setting Pacing Pulse Width: 0.4 ms
Lead Channel Setting Pacing Pulse Width: 1 ms
Lead Channel Setting Sensing Sensitivity: 0.3 mV

## 2019-05-29 NOTE — Progress Notes (Signed)
Remote ICD transmission.   

## 2019-06-01 ENCOUNTER — Telehealth: Payer: Self-pay | Admitting: Nurse Practitioner

## 2019-06-01 NOTE — Telephone Encounter (Signed)
New Message   Patient called in to schedule a packemaker check appointment with Chanetta Marshall via recall, but I am not showing any slots for that particular appointment on her schedule. Could you assist with getting patient scheduled for a pacemaker check appointment?   Thanks,

## 2019-06-04 ENCOUNTER — Ambulatory Visit (INDEPENDENT_AMBULATORY_CARE_PROVIDER_SITE_OTHER): Payer: Medicare HMO

## 2019-06-04 ENCOUNTER — Telehealth: Payer: Self-pay | Admitting: Internal Medicine

## 2019-06-04 DIAGNOSIS — I5042 Chronic combined systolic (congestive) and diastolic (congestive) heart failure: Secondary | ICD-10-CM

## 2019-06-04 DIAGNOSIS — Z95810 Presence of automatic (implantable) cardiac defibrillator: Secondary | ICD-10-CM | POA: Diagnosis not present

## 2019-06-04 NOTE — Telephone Encounter (Signed)
Spoke to pt to request manual transmission. Normal device function, no episodes. CRT-D is programmed DDD, lower rate of 50 bpm. BiV paces 98.7%. Pt complaining of dizzy spells, he has fallen recently, did not hit his head. States that he gets dizzy when his HR is in the 60s, BP typically runs 120s/80s. Pt states that he has tinnitus, recently fitted for hearing aids and states it helped his balance. Pt has to wait 4-6 weeks to receive hearing aids. Pt requesting f/u w/ Dr. Rayann Heman.  Pt has previously been diagnosed w/ vertigo (01/2019) per notes. Called pt back, reassured him device check looks normal. Pt states he had his PCP on hold.

## 2019-06-04 NOTE — Telephone Encounter (Signed)
Pt called to make Aug recall with [employer]. Luetta Nutting is only here two days in August and they are full. She is available and has open appts in September starting the 14th. Also offered Jonni Sanger or [facility] in August. Any of the APP's can see the patients that have recalls for [employer] or [facility].

## 2019-06-04 NOTE — Telephone Encounter (Signed)
° °  STAT if HR is under 50 or over 120 (normal HR is 60-100 beats per minute)  1) What is your heart rate?  No reading right now. They said it's high because they have been on hold for so long   2) Do you have a log of your heart rate readings (document readings)?     3) Do you have any other symptoms? Fainting   Patient has heart rate in the 60s, and will pass out. His heart rate usually stays in the 70s.   He and his wife are upset that he can not get an appointment until Thursday 08/06 with Chanetta Marshall

## 2019-06-05 ENCOUNTER — Telehealth: Payer: Self-pay

## 2019-06-05 ENCOUNTER — Ambulatory Visit (INDEPENDENT_AMBULATORY_CARE_PROVIDER_SITE_OTHER): Payer: Medicare HMO | Admitting: Family Medicine

## 2019-06-05 ENCOUNTER — Encounter: Payer: Self-pay | Admitting: Family Medicine

## 2019-06-05 ENCOUNTER — Other Ambulatory Visit: Payer: Self-pay

## 2019-06-05 DIAGNOSIS — R42 Dizziness and giddiness: Secondary | ICD-10-CM | POA: Diagnosis not present

## 2019-06-05 DIAGNOSIS — I10 Essential (primary) hypertension: Secondary | ICD-10-CM

## 2019-06-05 NOTE — Progress Notes (Signed)
Patient ID: Kevin [name], male   DOB: 09/08/1946, 73 y.o.   MRN: [id number]  This visit type was conducted due to national recommendations for restrictions regarding the COVID-19 pandemic in an effort to limit this patient's exposure and mitigate transmission in our community.   Virtual Visit via Telephone Note  I connected with Cassell Voorhies on 06/05/19 at 10:45 AM EDT by telephone and verified that I am speaking with the correct person using two identifiers.   I discussed the limitations, risks, security and privacy concerns of performing an evaluation and management service by telephone and the availability of in person appointments. I also discussed with the patient that there may be a patient responsible charge related to this service. The patient expressed understanding and agreed to proceed.  Location patient: home Location provider: work or home office Participants present for the call: patient, provider Patient did not have a visit in the prior 7 days to address this/these issue(s).   History of Present Illness: Patient relates some nonspecific dizziness over the past few months.  He has had vertigo in the past but this dizziness is different.  He describes more of a lightheadedness.  His blood pressure today at home was 120/81 sitting and standing 124/78.  Pulse 84.  No clear provoking factors.  He thought this was some dehydration but has been drinking about 6-8 bottles of water per day without improvement.  He had episode recently at a restaurant where he felt dizzy even sitting and apparently heart rate was 60 at that time.  He has not had any recent chest pains.  No syncope.  Has had a couple recent falls but denies any persistent headache or head injury.  He does take carvedilol along with other medications as listed.  His chronic problems include history of hypertension, nonischemic cardiomyopathy, chronic systolic heart failure, type 2 diabetes  No recent confusion.  No  recent focal weakness.   Observations/Objective: Patient sounds cheerful and well on the phone. I do not appreciate any SOB. Speech and thought processing are grossly intact. Patient reported vitals:  Assessment and Plan:  Patient relates 2 to 54-month history of nonspecific lightheadedness but no orthostatic change by their home blood pressures today.  -We recommended office follow-up tomorrow to assess more completely.  He will need some orthostatics and other work-up depending on history and physical at that time -Reminded to change positions slowly in the meantime and stay well-hydrated  Follow Up Instructions:  -Tomorrow   [id number] 5-10 [id number] 11-20 [id number] 21-30 I did not refer this patient for an OV in the next 24 hours for this/these issue(s).  I discussed the assessment and treatment plan with the patient. The patient was provided an opportunity to ask questions and all were answered. The patient agreed with the plan and demonstrated an understanding of the instructions.   The patient was advised to call back or seek an in-person evaluation if the symptoms worsen or if the condition fails to improve as anticipated.  I provided 22 minutes of non-face-to-face time during this encounter.   Carolann Littler, MD

## 2019-06-05 NOTE — Telephone Encounter (Signed)
Called patient to schedule him for a 30 minute appointment tomorrow, 06/06/19 in office per Dr. Elease Hashimoto for dizziness.  OK for PEC to advise/discuss  CRM Created.

## 2019-06-05 NOTE — Progress Notes (Signed)
EPIC Encounter for ICM Monitoring  Patient Name: Kevin [name] is a 73 y.o. male Date: 06/05/2019 Primary Care Physican: [name], MD Primary Cardiologist:[name] Electrophysiologist: [name] Bi-V Pacing: 99.5% 06/06/2019 Weight:178lbs   Spoke with patient and he is doing well except he has been falling a lot.    OptivolThoracic impedance normal but suggestive of possible fluid accumulation 7/16 - 7/26.  Labs: 02/27/2019 Creatinine 1.12, BUN 15, Potassium 4.6, Sodium 140, GFR 64.22  Patient does not take a diuretic.  Recommendations:No changes and encouraged to call if experiencing any fluid symptoms.  Follow up plan: ICM clinic phone appointment on8/31/2020.  Copy of ICM check sent to Dr.[name].    3 month ICM trend: 06/04/2019    1 Year ICM trend:       Rosalene Billings, RN 06/05/2019 11:33 AM

## 2019-06-06 ENCOUNTER — Ambulatory Visit (INDEPENDENT_AMBULATORY_CARE_PROVIDER_SITE_OTHER): Payer: Medicare HMO | Admitting: Family Medicine

## 2019-06-06 ENCOUNTER — Other Ambulatory Visit: Payer: Self-pay

## 2019-06-06 ENCOUNTER — Encounter: Payer: Self-pay | Admitting: Family Medicine

## 2019-06-06 VITALS — BP 122/68 | HR 68 | Temp 97.9°F | Ht 75.0 in | Wt 187.5 lb

## 2019-06-06 DIAGNOSIS — I10 Essential (primary) hypertension: Secondary | ICD-10-CM | POA: Diagnosis not present

## 2019-06-06 DIAGNOSIS — R42 Dizziness and giddiness: Secondary | ICD-10-CM

## 2019-06-06 LAB — CBC WITH DIFFERENTIAL/PLATELET
Basophils Absolute: 0 10*3/uL (ref 0.0–0.1)
Basophils Relative: 0.4 % (ref 0.0–3.0)
Eosinophils Absolute: 0.2 10*3/uL (ref 0.0–0.7)
Eosinophils Relative: 2.6 % (ref 0.0–5.0)
HCT: 44.2 % (ref 39.0–52.0)
Hemoglobin: 14.8 g/dL (ref 13.0–17.0)
Lymphocytes Relative: 18.7 % (ref 12.0–46.0)
Lymphs Abs: 1.1 10*3/uL (ref 0.7–4.0)
MCHC: 33.6 g/dL (ref 30.0–36.0)
MCV: 98.7 fl (ref 78.0–100.0)
Monocytes Absolute: 0.6 10*3/uL (ref 0.1–1.0)
Monocytes Relative: 9.2 % (ref 3.0–12.0)
Neutro Abs: 4.1 10*3/uL (ref 1.4–7.7)
Neutrophils Relative %: 69.1 % (ref 43.0–77.0)
Platelets: 147 10*3/uL — ABNORMAL LOW (ref 150.0–400.0)
RBC: 4.48 Mil/uL (ref 4.22–5.81)
RDW: 13 % (ref 11.5–15.5)
WBC: 6 10*3/uL (ref 4.0–10.5)

## 2019-06-06 LAB — TSH: TSH: 0.49 u[IU]/mL (ref 0.35–4.50)

## 2019-06-06 MED ORDER — BUPROPION HCL ER (XL) 150 MG PO TB24: 150.0000 mg | ORAL_TABLET | Freq: Every day | ORAL | 0 refills | Status: DC

## 2019-06-06 NOTE — Patient Instructions (Signed)
Dizziness Dizziness is a common problem. It is a feeling of unsteadiness or light-headedness. You may feel like you are about to faint. Dizziness can lead to injury if you stumble or fall. Anyone can become dizzy, but dizziness is more common in older adults. This condition can be caused by a number of things, including medicines, dehydration, or illness. Follow these instructions at home: Eating and drinking  Drink enough fluid to keep your urine clear or pale yellow. This helps to keep you from becoming dehydrated. Try to drink more clear fluids, such as water.  Do not drink alcohol.  Limit your caffeine intake if told to do so by your health care provider. Check ingredients and nutrition facts to see if a food or beverage contains caffeine.  Limit your salt (sodium) intake if told to do so by your health care provider. Check ingredients and nutrition facts to see if a food or beverage contains sodium. Activity  Avoid making quick movements. ? Rise slowly from chairs and steady yourself until you feel okay. ? In the morning, first sit up on the side of the bed. When you feel okay, stand slowly while you hold onto something until you know that your balance is fine.  If you need to stand in one place for a long time, move your legs often. Tighten and relax the muscles in your legs while you are standing.  Do not drive or use heavy machinery if you feel dizzy.  Avoid bending down if you feel dizzy. Place items in your home so that they are easy for you to reach without leaning over. Lifestyle  Do not use any products that contain nicotine or tobacco, such as cigarettes and e-cigarettes. If you need help quitting, ask your health care provider.  Try to reduce your stress level by using methods such as yoga or meditation. Talk with your health care provider if you need help to manage your stress. General instructions  Watch your dizziness for any changes.  Take over-the-counter and  prescription medicines only as told by your health care provider. Talk with your health care provider if you think that your dizziness is caused by a medicine that you are taking.  Tell a friend or a family member that you are feeling dizzy. If he or she notices any changes in your behavior, have this person call your health care provider.  Keep all follow-up visits as told by your health care provider. This is important. Contact a health care provider if:  Your dizziness does not go away.  Your dizziness or light-headedness gets worse.  You feel nauseous.  You have reduced hearing.  You have new symptoms.  You are unsteady on your feet or you feel like the room is spinning. Get help right away if:  You vomit or have diarrhea and are unable to eat or drink anything.  You have problems talking, walking, swallowing, or using your arms, hands, or legs.  You feel generally weak.  You are not thinking clearly or you have trouble forming sentences. It may take a friend or family member to notice this.  You have chest pain, abdominal pain, shortness of breath, or sweating.  Your vision changes.  You have any bleeding.  You have a severe headache.  You have neck pain or a stiff neck.  You have a fever. These symptoms may represent a serious problem that is an emergency. Do not wait to see if the symptoms will go away. Get medical help   right away. Call your local emergency services (911 in the [place] Do not drive yourself to the hospital. Summary  Dizziness is a feeling of unsteadiness or light-headedness. This condition can be caused by a number of things, including medicines, dehydration, or illness.  Anyone can become dizzy, but dizziness is more common in older adults.  Drink enough fluid to keep your urine clear or pale yellow. Do not drink alcohol.  Avoid making quick movements if you feel dizzy. Monitor your dizziness for any changes. This information is not intended to  replace advice given to you by your health care provider. Make sure you discuss any questions you have with your health care provider. Document Released: 04/20/2001 Document Revised: 10/28/2017 Document Reviewed: 11/27/2016 Elsevier Patient Education  2020 [employer].

## 2019-06-06 NOTE — Progress Notes (Signed)
Subjective:     Patient ID: Kevin [name], male   DOB: 1946/05/30, 73 y.o.   MRN: [id number]  HPI Patient has chronic problems including hypertension, history of ventricular tachycardia, sick sinus syndrome, type 2 diabetes, history of prostate cancer.  Is had some nonspecific lightheadedness.  Refer to note from yesterday for further details.  No syncope.  No chest pains.  He has some chronic tinnitus and he has concerns whether that may be related.  However, he is not complaining of any vertigo currently.  He is in process of getting some new hearing aids through the [place].  He states he had pacemaker interrogation couple days ago per cardiology and this was without any problems.  They had not noted any recent orthostatic changes  Past Medical History:  Diagnosis Date  . Agent orange exposure    in [place]  . Anemia   . Heart block AV second degree    a. s/p PPM implant with subsequent CRTD upgrade  . High cholesterol   . History of colon polyps   . Hypertension   . LBBB (left bundle branch block)   . Nonischemic cardiomyopathy ([place])    a. MDT CRTD upgrade 2016  . Pacemaker 08/27/2013   Dual-chamber Medtronic Adapta implanted January 2013 for bradycardia with alternating bundle branch block and 2:1 atrioventricular block   . Prostate cancer ([place])    "not been treated yet; on a wait and see" (01/22/2015)  . Type II diabetes mellitus ([place])    Past Surgical History:  Procedure Laterality Date  . BASAL CELL CARCINOMA EXCISION     "back, face, neck"  . BI-VENTRICULAR IMPLANTABLE CARDIOVERTER DEFIBRILLATOR UPGRADE N/A 01/22/2015   MDT CRTD upgrade by Dr Rayann Heman  . CARDIAC CATHETERIZATION  01/27/2012   normal coronaries, dilated LV c/w nonischemic cardiomyopathy  . EXAMINATION UNDER ANESTHESIA  04/06/2012   Procedure: EXAM UNDER ANESTHESIA;  Surgeon: Marcello Moores A. Cornett, MD;  Location: [place];  Service: General;  Laterality: N/A;  . INCISION AND DRAINAGE PERIRECTAL ABSCESS  ~ 2010; 2015   . INGUINAL HERNIA REPAIR Left 1980  . LEFT HEART CATHETERIZATION WITH CORONARY ANGIOGRAM N/A 01/27/2012   Procedure: LEFT HEART CATHETERIZATION WITH CORONARY ANGIOGRAM;  Surgeon: Troy Sine, MD;  Location: [facility] CATH LAB;  Service: Cardiovascular;  Laterality: N/A;  . NM MYOCAR PERF WALL MOTION  01/21/2012   mod-severe perfusion defect due to infarct/scar w/mild perinfarct ischemia in the apical,basal inferoseptal,basal inferior,mid inferoseptal,midinferior and apical inferior regions  . PERMANENT PACEMAKER INSERTION N/A 12/01/2011   Medtronic implanted by Dr Sallyanne Kuster  . PROSTATE BIOPSY    . PROSTATE BIOPSY    . shrapnel surgery     "in [place]"  . TONSILLECTOMY      reports that he quit smoking about 35 years ago. His smoking use included cigarettes. He has a 20.00 pack-year smoking history. He has never used smokeless tobacco. He reports current alcohol use. He reports that he does not use drugs. family history includes Heart disease in his father; Leukemia in his father; Ovarian cancer in his mother. Allergies  Allergen Reactions  . Sulfa Antibiotics Swelling    Swelling of lips only.     Review of Systems  Constitutional: Negative for chills and fever.  Eyes: Negative for visual disturbance.  Respiratory: Negative for cough and shortness of breath.   Cardiovascular: Negative for chest pain.  Gastrointestinal: Negative for abdominal pain.  Genitourinary: Negative for dysuria.  Neurological: Positive for dizziness and light-headedness. Negative for seizures,  syncope, facial asymmetry, speech difficulty and weakness.  Hematological: Does not bruise/bleed easily.  Psychiatric/Behavioral: Negative for confusion.       Objective:   Physical Exam Constitutional:      Appearance: Normal appearance.  Neck:     Musculoskeletal: Neck supple.     Comments: No carotid bruits Cardiovascular:     Rate and Rhythm: Normal rate and regular rhythm.  Pulmonary:     Effort: Pulmonary  effort is normal.     Breath sounds: Normal breath sounds.  Musculoskeletal:     Right lower leg: No edema.     Left lower leg: No edema.  Neurological:     General: No focal deficit present.     Mental Status: He is alert and oriented to person, place, and time.     Cranial Nerves: No cranial nerve deficit.     Comments: No focal weakness.  Gait normal. Cerebellar function normal        Assessment:     Patient presents with nonspecific lightheadedness and dizziness.  He does not have any orthostatic drop.  Blood pressure seated left arm 130/74 and standing 136/78  He does have chronic tinnitus but no recent vertigo    Plan:     -Check CBC and TSH.  He had recent electrolytes which were unremarkable. -Change positions slowly -Follow-up promptly for any progressive dizziness or any new symptoms  [name] MD [facility]

## 2019-06-06 NOTE — Telephone Encounter (Signed)
Pt is being assessed by PCP for dizziness.  Pt is due for f/u with AS.  Will send to scheduler to set up f/u appt.

## 2019-06-13 ENCOUNTER — Telehealth: Payer: Self-pay | Admitting: Family Medicine

## 2019-06-13 NOTE — Telephone Encounter (Signed)
OK to send request?

## 2019-06-13 NOTE — Telephone Encounter (Signed)
He is seeing Dr Dwyane Dee ([place]) for his diabetes so would address with them.

## 2019-06-13 NOTE — Telephone Encounter (Signed)
See request °

## 2019-06-13 NOTE — Telephone Encounter (Signed)
Pharmacy called about the Rx for [facility] reader and censors/ please advise or send to pharmacy

## 2019-06-14 ENCOUNTER — Telehealth (INDEPENDENT_AMBULATORY_CARE_PROVIDER_SITE_OTHER): Payer: Medicare HMO | Admitting: Family Medicine

## 2019-06-14 ENCOUNTER — Other Ambulatory Visit: Payer: Self-pay

## 2019-06-14 ENCOUNTER — Encounter: Payer: Self-pay | Admitting: Family Medicine

## 2019-06-14 DIAGNOSIS — R197 Diarrhea, unspecified: Secondary | ICD-10-CM | POA: Diagnosis not present

## 2019-06-14 DIAGNOSIS — Z20828 Contact with and (suspected) exposure to other viral communicable diseases: Secondary | ICD-10-CM | POA: Diagnosis not present

## 2019-06-14 DIAGNOSIS — Z20822 Contact with and (suspected) exposure to covid-19: Secondary | ICD-10-CM

## 2019-06-14 NOTE — Progress Notes (Addendum)
Cardiology Office Note Date:  06/14/2019  Patient ID:  Kevin, [name] Jun 06, 1946, MRN [id number] PCP:  [name], MD  Endo: Dr. Dwyane Dee Cardiologist: Dr. Sallyanne Kuster Electrophysiologist: Dr. Rayann Heman    Chief Complaint: annual device visit  History of Present Illness: Kevin [name] is a 73 y.o. male with history of PPM with alternation RBBB/LBBB AV block, DM, HTN, NICM with cath 2013 without CAD, 2016 saw EP, Dr. Rayann Heman for worsening CHF, he did and echo revealed EF 25-30%, myoview revealed large scar with peri-infarct ischemia unchanged from study 2014 and subsequently underwent upgrade to CRT-D.     He comes in today to be seen for Dr. Rayann Heman.  He last saw him Aug 2019.  He was doing well, euvolemic, normal device function noted (noting h/o diaphragmatic stim).  He feels quite well.  There was mention in Epic of some dizziness, this resolved with better hydration.   He denies any near syncope or syncope.  No CP, palpitations, no SOB, DOE, no symptoms of PND or orthopnea.  No shocks.   He reports labs done bi-annualy with his PMD  COVID education/precautions were discussed with the patient today   Device History: MDT dual chamber pacemaker implanted 11/2011 for Mobtiz II heart block (Dr C); upgrade to MDT CRTD 01/2015 for non ischemic cardiomyopathy, CHF, pacing induced LBBB by Dr Rayann Heman History of appropriate therapy: No History of AAD therapy: No  DEVICE DEPENDENT  June 2017 at that time doing well, reprogramming LV lead in effort to eliminate diaphrag stim   Past Medical History:  Diagnosis Date  . Agent orange exposure    in [place]  . Anemia   . Heart block AV second degree    a. s/p PPM implant with subsequent CRTD upgrade  . High cholesterol   . History of colon polyps   . Hypertension   . LBBB (left bundle branch block)   . Nonischemic cardiomyopathy ([place])    a. MDT CRTD upgrade 2016  . Pacemaker 08/27/2013   Dual-chamber Medtronic Adapta  implanted January 2013 for bradycardia with alternating bundle branch block and 2:1 atrioventricular block   . Prostate cancer ([place])    "not been treated yet; on a wait and see" (01/22/2015)  . Type II diabetes mellitus ([place])     Past Surgical History:  Procedure Laterality Date  . BASAL CELL CARCINOMA EXCISION     "back, face, neck"  . BI-VENTRICULAR IMPLANTABLE CARDIOVERTER DEFIBRILLATOR UPGRADE N/A 01/22/2015   MDT CRTD upgrade by Dr Rayann Heman  . CARDIAC CATHETERIZATION  01/27/2012   normal coronaries, dilated LV c/w nonischemic cardiomyopathy  . EXAMINATION UNDER ANESTHESIA  04/06/2012   Procedure: EXAM UNDER ANESTHESIA;  Surgeon: Marcello Moores A. Cornett, MD;  Location: [place];  Service: General;  Laterality: N/A;  . INCISION AND DRAINAGE PERIRECTAL ABSCESS  ~ 2010; 2015  . INGUINAL HERNIA REPAIR Left 1980  . LEFT HEART CATHETERIZATION WITH CORONARY ANGIOGRAM N/A 01/27/2012   Procedure: LEFT HEART CATHETERIZATION WITH CORONARY ANGIOGRAM;  Surgeon: Troy Sine, MD;  Location: [facility] C[facility] LAB;  Service: Cardiovascular;  Laterality: N/A;  . NM MYOCAR PERF WALL MOTION  01/21/2012   mod-severe perfusion defect due to infarct/scar w/mild perinfarct ischemia in the apical,basal inferoseptal,basal inferior,mid inferoseptal,midinferior and apical inferior regions  . PERMANENT PACEMAKER INSERTION N/A 12/01/2011   Medtronic implanted by Dr Sallyanne Kuster  . PROSTATE BIOPSY    . PROSTATE BIOPSY    . shrapnel surgery     "in [place]"  .  TONSILLECTOMY      Current Outpatient Medications  Medication Sig Dispense Refill  . ACCU-CHEK COMPACT PLUS test strip use 1 TEST STRIP once daily as directed 102 each 5  . aspirin 81 MG tablet Take 81 mg by mouth daily.      Marland Kitchen atorvastatin (LIPITOR) 80 MG tablet Take 40 mg by mouth daily at 6 PM.    . buPROPion (WELLBUTRIN XL) 150 MG 24 hr tablet Take 1 tablet (150 mg total) by mouth daily. 90 tablet 0  . carvedilol (COREG) 12.5 MG tablet Take 6.25 mg by mouth 2 (two) times  daily with a meal.    . CHELATED MAGNESIUM PO Take 250 mg by mouth daily.    . clobetasol ointment (TEMOVATE) 7.40 % Apply 1 application topically daily as needed (itching).     . Coenzyme Q10 (COQ10) 100 MG CAPS Take by mouth.    . donepezil (ARICEPT) 5 MG tablet Take 1/2 tablet daily for 2 weeks, then increase to 1 tablet daily 30 tablet 11  . latanoprost (XALATAN) 0.005 % ophthalmic solution Place 1 drop into the right eye daily.     Marland Kitchen LORazepam (ATIVAN) 0.5 MG tablet Take 0.5 mg by mouth every 8 (eight) hours. As needed    . SitaGLIPtin-MetFORMIN HCl (JANUMET XR) [phone] MG TB24 Take 1 tablet by mouth daily. 30 tablet 4  . TURMERIC PO Take 1 tablet by mouth daily.    Marland Kitchen VIAGRA 100 MG tablet TAKE ONE TABLET BY MOUTH AS NEEDED FOR ERECTILE DYSFUNCTION. 10 tablet 3   No current facility-administered medications for this visit.     Allergies:   Sulfa antibiotics   Social History:  The patient  reports that he quit smoking about 35 years ago. His smoking use included cigarettes. He has a 20.00 pack-year smoking history. He has never used smokeless tobacco. He reports current alcohol use. He reports that he does not use drugs.   Family History:  The patient's family history includes Heart disease in his father; Leukemia in his father; Ovarian cancer in his mother.  ROS:  Please see the history of present illness.  All other systems are reviewed and otherwise negative.   PHYSICAL EXAM:  VS:  There were no vitals taken for this visit. BMI: There is no height or weight on file to calculate BMI. Well nourished, well developed, in no acute distress  HEENT: normocephalic, atraumatic  Neck: no JVD, carotid bruits or masses Cardiac:  RRR; no significant murmurs, no rubs, or gallops Lungs:  CTA b/l, no wheezing, rhonchi or rales  Abd: soft, nontender MS: no deformity, age appropriate atrophy Ext:   no edema  Skin: warm and dry, no rash Neuro:  No gross deficits appreciated Psych: euthymic  mood, full affect  ICD site is stable, no tethering or discomfort   EKG:  Done today and reviewed by myself shows: SR, V paced  ICD interrogation done today, reviewed by myself: battery life est 12months, lead measurements are stable, no arrhythmias, therapies   05/05/16: TTE Study Conclusions - Left ventricle: The cavity size was normal. There was mild   concentric hypertrophy. Systolic function was moderately reduced.   The estimated ejection fraction was in the range of 35% to 40%.   Diffuse hypokinesis. Doppler parameters are consistent with   abnormal left ventricular relaxation (grade 1 diastolic   dysfunction). Doppler parameters are consistent with   indeterminate ventricular filling pressure. - Ventricular septum: Septal motion showed abnormal function and   dyssynergy. -  Aortic valve: There was no regurgitation. - Mitral valve: There was no regurgitation. - Right ventricle: The cavity size was normal. Wall thickness was   normal. Systolic function was normal. - Atrial septum: No defect or patent foramen ovale was identified   by color flow Doppler. - Tricuspid valve: There was mild regurgitation. - Pulmonary arteries: Systolic pressure was within the normal   range. PA peak pressure: 25 mm Hg (S).  Recent Labs: 02/27/2019: ALT 11; BUN 15; Creatinine, Ser 1.12; Potassium 4.6; Sodium 140 06/06/2019: Hemoglobin 14.8; Platelets 147.0; TSH 0.49  07/24/2018: Cholesterol 91; HDL 30.90; LDL Cholesterol 38; Total CHOL/HDL Ratio 3; Triglycerides 107.0; VLDL 21.4   CrCl cannot be calculated (Patient's most recent lab result is older than the maximum 21 days allowed.).   Wt Readings from Last 3 Encounters:  06/06/19 187 lb 8 oz (85 kg)  03/02/19 181 lb (82.1 kg)  01/25/19 177 lb (80.3 kg)     Other studies reviewed: Additional studies/records reviewed today include: summarized above  ASSESSMENT AND PLAN:  1. ICD     intact device function, no changes made     H/o LV lead  diaphragmatic stim, none with current configuration  2. NICM      No symptoms or exam findings to suggest fluid OL     OptiVol looks good     Followed by L. Short, RN     On BB, diuretic tx  He has historically been on an ACE as well as ARB, unclear why stopped, in d/w his wife via telephone, ? If was BP  3. Hx of CHB     ICD functioning appropriately     He is device dependent  4. HTN     No changes    Disposition:   He is doing remotes, will follow his battery, see him back in 1 year, sooner if needed.  He will c/w Dr. Sallyanne Kuster as well.    Current medicines are reviewed at length with the patient today.  The patient did not have any concerns regarding medicines.  Haywood Lasso, PA-C 06/14/2019 7:58 PM     [address] [place] [address] [phone] (office)  [phone] (fax)

## 2019-06-14 NOTE — Progress Notes (Signed)
This visit type was conducted due to national recommendations for restrictions regarding the COVID-19 pandemic in an effort to limit this patient's exposure and mitigate transmission in our community.   Virtual Visit via Video Note  I connected with Kevin [name] on 06/14/19 at 11:00 AM EDT by a video enabled telemedicine application and verified that I am speaking with the correct person using two identifiers.  Location patient: home Location provider:work or home office Persons participating in the virtual visit: patient, provider  I discussed the limitations of evaluation and management by telemedicine and the availability of in person appointments. The patient expressed understanding and agreed to proceed.   HPI: Patient is seen without 4-day history of diarrhea and some intermittent headache.  He is having about 3-4 loose to watery nonbloody stools per day.  No recent travel.  No recent antibiotics.  He denies any body aches, sweats, abdominal pain, nasal congestion, or any loss of taste or smell.  He has rare chronic cough.  No dyspnea.  Denies any nausea or vomiting.   ROS: See pertinent positives and negatives per HPI.  Past Medical History:  Diagnosis Date  . Agent orange exposure    in [place]  . Anemia   . Heart block AV second degree    a. s/p PPM implant with subsequent CRTD upgrade  . High cholesterol   . History of colon polyps   . Hypertension   . LBBB (left bundle branch block)   . Nonischemic cardiomyopathy ([place])    a. MDT CRTD upgrade 2016  . Pacemaker 08/27/2013   Dual-chamber Medtronic Adapta implanted January 2013 for bradycardia with alternating bundle branch block and 2:1 atrioventricular block   . Prostate cancer ([place])    "not been treated yet; on a wait and see" (01/22/2015)  . Type II diabetes mellitus ([place])     Past Surgical History:  Procedure Laterality Date  . BASAL CELL CARCINOMA EXCISION     "back, face, neck"  . BI-VENTRICULAR  IMPLANTABLE CARDIOVERTER DEFIBRILLATOR UPGRADE N/A 01/22/2015   MDT CRTD upgrade by Dr Rayann Heman  . CARDIAC CATHETERIZATION  01/27/2012   normal coronaries, dilated LV c/w nonischemic cardiomyopathy  . EXAMINATION UNDER ANESTHESIA  04/06/2012   Procedure: EXAM UNDER ANESTHESIA;  Surgeon: Marcello Moores A. Cornett, MD;  Location: [place];  Service: General;  Laterality: N/A;  . INCISION AND DRAINAGE PERIRECTAL ABSCESS  ~ 2010; 2015  . INGUINAL HERNIA REPAIR Left 1980  . LEFT HEART CATHETERIZATION WITH CORONARY ANGIOGRAM N/A 01/27/2012   Procedure: LEFT HEART CATHETERIZATION WITH CORONARY ANGIOGRAM;  Surgeon: Troy Sine, MD;  Location: [facility] CATH LAB;  Service: Cardiovascular;  Laterality: N/A;  . NM MYOCAR PERF WALL MOTION  01/21/2012   mod-severe perfusion defect due to infarct/scar w/mild perinfarct ischemia in the apical,basal inferoseptal,basal inferior,mid inferoseptal,midinferior and apical inferior regions  . PERMANENT PACEMAKER INSERTION N/A 12/01/2011   Medtronic implanted by Dr Sallyanne Kuster  . PROSTATE BIOPSY    . PROSTATE BIOPSY    . shrapnel surgery     "in [place]"  . TONSILLECTOMY      Family History  Problem Relation Age of Onset  . Ovarian cancer Mother   . Leukemia Father   . Heart disease Father   . Diabetes Neg Hx     SOCIAL HX: Former smoker.  Quit 1985   Current Outpatient Medications:  .  ACCU-CHEK COMPACT PLUS test strip, use 1 TEST STRIP once daily as directed, Disp: 102 each, Rfl: 5 .  aspirin  81 MG tablet, Take 81 mg by mouth daily.  , Disp: , Rfl:  .  atorvastatin (LIPITOR) 80 MG tablet, Take 40 mg by mouth daily at 6 PM., Disp: , Rfl:  .  buPROPion (WELLBUTRIN XL) 150 MG 24 hr tablet, Take 1 tablet (150 mg total) by mouth daily., Disp: 90 tablet, Rfl: 0 .  carvedilol (COREG) 12.5 MG tablet, Take 6.25 mg by mouth 2 (two) times daily with a meal., Disp: , Rfl:  .  CHELATED MAGNESIUM PO, Take 250 mg by mouth daily., Disp: , Rfl:  .  clobetasol ointment (TEMOVATE) 1.61 %,  Apply 1 application topically daily as needed (itching). , Disp: , Rfl:  .  Coenzyme Q10 (COQ10) 100 MG CAPS, Take by mouth., Disp: , Rfl:  .  donepezil (ARICEPT) 5 MG tablet, Take 1/2 tablet daily for 2 weeks, then increase to 1 tablet daily, Disp: 30 tablet, Rfl: 11 .  latanoprost (XALATAN) 0.005 % ophthalmic solution, Place 1 drop into the right eye daily. , Disp: , Rfl:  .  LORazepam (ATIVAN) 0.5 MG tablet, Take 0.5 mg by mouth every 8 (eight) hours. As needed, Disp: , Rfl:  .  SitaGLIPtin-MetFORMIN HCl (JANUMET XR) [phone] MG TB24, Take 1 tablet by mouth daily., Disp: 30 tablet, Rfl: 4 .  TURMERIC PO, Take 1 tablet by mouth daily., Disp: , Rfl:  .  VIAGRA 100 MG tablet, TAKE ONE TABLET BY MOUTH AS NEEDED FOR ERECTILE DYSFUNCTION., Disp: 10 tablet, Rfl: 3  EXAM:  VITALS per patient if applicable:  GENERAL: alert, oriented, appears well and in no acute distress  HEENT: atraumatic, conjunttiva clear, no obvious abnormalities on inspection of external nose and ears  NECK: normal movements of the head and neck  LUNGS: on inspection no signs of respiratory distress, breathing rate appears normal, no obvious gross SOB, gasping or wheezing  CV: no obvious cyanosis  MS: moves all visible extremities without noticeable abnormality  PSYCH/NEURO: pleasant and cooperative, no obvious depression or anxiety, speech and thought processing grossly intact  ASSESSMENT AND PLAN:  Discussed the following assessment and plan:  Diarrhea and intermittent headache.  Symptoms are relatively mild as above.  Patient has concerns regarding potential COVID-19 infection.  -Refer for COVID-19 testing -In the meantime he will stay quarantined and plenty of fluids and rest.  Discussed appropriate diet for diarrhea in adults.  Consider over-the-counter Imodium for any worsening diarrhea symptoms     I discussed the assessment and treatment plan with the patient. The patient was provided an opportunity to  ask questions and all were answered. The patient agreed with the plan and demonstrated an understanding of the instructions.   The patient was advised to call back or seek an in-person evaluation if the symptoms worsen or if the condition fails to improve as anticipated.    Carolann Littler, MD

## 2019-06-15 ENCOUNTER — Encounter: Payer: Self-pay | Admitting: Physician Assistant

## 2019-06-15 ENCOUNTER — Other Ambulatory Visit: Payer: Self-pay

## 2019-06-15 ENCOUNTER — Ambulatory Visit (INDEPENDENT_AMBULATORY_CARE_PROVIDER_SITE_OTHER): Payer: Medicare HMO | Admitting: Physician Assistant

## 2019-06-15 VITALS — BP 140/80 | HR 72 | Ht 75.0 in | Wt 185.0 lb

## 2019-06-15 DIAGNOSIS — I442 Atrioventricular block, complete: Secondary | ICD-10-CM

## 2019-06-15 DIAGNOSIS — Z95810 Presence of automatic (implantable) cardiac defibrillator: Secondary | ICD-10-CM

## 2019-06-15 DIAGNOSIS — I428 Other cardiomyopathies: Secondary | ICD-10-CM | POA: Diagnosis not present

## 2019-06-15 DIAGNOSIS — I519 Heart disease, unspecified: Secondary | ICD-10-CM | POA: Diagnosis not present

## 2019-06-15 DIAGNOSIS — I10 Essential (primary) hypertension: Secondary | ICD-10-CM

## 2019-06-15 LAB — SPECIMEN STATUS REPORT

## 2019-06-15 LAB — NOVEL CORONAVIRUS, NAA: SARS-CoV-2, NAA: NOT DETECTED

## 2019-06-15 NOTE — Patient Instructions (Signed)
Medication Instructions:    Your physician recommends that you continue on your current medications as directed. Please refer to the Current Medication list given to you today.   If you need a refill on your cardiac medications before your next appointment, please call your pharmacy.   Lab work: NONE ORDERED  TODAY  If you have labs (blood work) drawn today and your tests are completely normal, you will receive your results only by: . MyChart Message (if you have MyChart) OR . A paper copy in the mail If you have any lab test that is abnormal or we need to change your treatment, we will call you to review the results.  Testing/Procedures: NONE ORDERED  TODAY    Follow-Up: At [facility], you and your health needs are our priority.  As part of our continuing mission to provide you with exceptional heart care, we have created designated Provider Care Teams.  These Care Teams include your primary Cardiologist (physician) and Advanced Practice Providers (APPs -  Physician Assistants and Nurse Practitioners) who all work together to provide you with the care you need, when you need it. You will need a follow up appointment in 6 months.  Please call our office 2 months in advance to schedule this appointment.  You may see Dr. [name] or one of the following Advanced Practice Providers on your designated Care Team:   Amber Seiler, NP . Renee Ursuy, PA-C  Any Other Special Instructions Will Be Listed Below (If Applicable).    

## 2019-06-15 NOTE — Telephone Encounter (Signed)
Called patient and gave him message from Dr. Burchette. Patient verbalized an understanding. 

## 2019-06-15 NOTE — Progress Notes (Signed)
Thanks, Renee. He used to take losartan, not sure why stopped. Will look into it at his appt.

## 2019-07-02 ENCOUNTER — Other Ambulatory Visit: Payer: Medicare HMO

## 2019-07-05 ENCOUNTER — Ambulatory Visit: Payer: Medicare HMO | Admitting: Endocrinology

## 2019-07-09 ENCOUNTER — Ambulatory Visit (INDEPENDENT_AMBULATORY_CARE_PROVIDER_SITE_OTHER): Payer: Medicare HMO

## 2019-07-09 DIAGNOSIS — Z95810 Presence of automatic (implantable) cardiac defibrillator: Secondary | ICD-10-CM

## 2019-07-09 DIAGNOSIS — I519 Heart disease, unspecified: Secondary | ICD-10-CM

## 2019-07-10 ENCOUNTER — Telehealth: Payer: Self-pay

## 2019-07-10 NOTE — Progress Notes (Signed)
EPIC Encounter for ICM Monitoring  Patient Name: Kevin [name] is a 73 y.o. male Date: 07/10/2019 Primary Care Physican: [name], MD Primary Cardiologist:[name] Electrophysiologist: [name] Bi-V Pacing: 99.7% 06/06/2019 Weight:178lbs   Attempted call to patient and unable to reach.  Left detailed message per DPR regarding transmission. Transmission reviewed.   OptivolThoracic impedance normal but suggestive of possible fluid accumulation 7/16 - 7/26.  Labs: 02/27/2019 Creatinine 1.12, BUN 15, Potassium 4.6, Sodium 140, GFR 64.22  Patient does not take a diuretic.  Recommendations:Left voice mail with ICM number and encouraged to call if experiencing any fluid symptoms.  Follow-up plan: ICM clinic phone appointment on 08/22/2019.   91 day device clinic remote transmission 08/21/2019.     Copy of ICM check sent to Dr. Rayann Heman.   3 month ICM trend: 07/09/2019    1 Year ICM trend:       Rosalene Billings, RN 07/10/2019 1:11 PM

## 2019-07-10 NOTE — Telephone Encounter (Signed)
Remote ICM transmission received.  Attempted call to patient regarding ICM remote transmission and left detailed message per DPR.  Advised to return call for any fluid symptoms or questions. Next ICM remote transmission scheduled 08/22/2019.

## 2019-07-20 ENCOUNTER — Other Ambulatory Visit: Payer: Self-pay

## 2019-07-20 ENCOUNTER — Other Ambulatory Visit (INDEPENDENT_AMBULATORY_CARE_PROVIDER_SITE_OTHER): Payer: Medicare HMO

## 2019-07-20 DIAGNOSIS — E1165 Type 2 diabetes mellitus with hyperglycemia: Secondary | ICD-10-CM

## 2019-07-20 DIAGNOSIS — R69 Illness, unspecified: Secondary | ICD-10-CM | POA: Diagnosis not present

## 2019-07-20 DIAGNOSIS — E7800 Pure hypercholesterolemia, unspecified: Secondary | ICD-10-CM

## 2019-07-20 LAB — BASIC METABOLIC PANEL
BUN: 14 mg/dL (ref 6–23)
CO2: 28 mEq/L (ref 19–32)
Calcium: 9 mg/dL (ref 8.4–10.5)
Chloride: 106 mEq/L (ref 96–112)
Creatinine, Ser: 1.22 mg/dL (ref 0.40–1.50)
GFR: 58.12 mL/min — ABNORMAL LOW (ref >60.00)
Glucose, Bld: 154 mg/dL — ABNORMAL HIGH (ref 70–99)
Potassium: 4.5 mEq/L (ref 3.5–5.1)
Sodium: 140 mEq/L (ref 135–145)

## 2019-07-20 LAB — HEMOGLOBIN A1C: Hgb A1c MFr Bld: 6.7 % — ABNORMAL HIGH (ref 4.6–6.5)

## 2019-07-20 LAB — LIPID PANEL
Cholesterol: 125 mg/dL (ref 0–200)
HDL: 34.1 mg/dL — ABNORMAL LOW (ref >39.00)
LDL Cholesterol: 64 mg/dL (ref 0–99)
NonHDL: 91.17
Total CHOL/HDL Ratio: 4
Triglycerides: 134 mg/dL (ref 0.0–149.0)
VLDL: 26.8 mg/dL (ref 0.0–40.0)

## 2019-07-20 LAB — MICROALBUMIN / CREATININE URINE RATIO
Creatinine,U: 218.2 mg/dL
Microalb Creat Ratio: 1 mg/g (ref 0.0–30.0)
Microalb, Ur: 2.3 mg/dL — ABNORMAL HIGH (ref 0.0–1.9)

## 2019-07-24 ENCOUNTER — Encounter: Payer: Self-pay | Admitting: Endocrinology

## 2019-07-24 ENCOUNTER — Other Ambulatory Visit: Payer: Self-pay

## 2019-07-24 ENCOUNTER — Encounter: Payer: Medicare HMO | Admitting: Endocrinology

## 2019-07-27 NOTE — Progress Notes (Signed)
This encounter was created in error - please disregard.

## 2019-08-21 ENCOUNTER — Ambulatory Visit (INDEPENDENT_AMBULATORY_CARE_PROVIDER_SITE_OTHER): Payer: Medicare HMO | Admitting: *Deleted

## 2019-08-21 DIAGNOSIS — I472 Ventricular tachycardia, unspecified: Secondary | ICD-10-CM

## 2019-08-21 DIAGNOSIS — I495 Sick sinus syndrome: Secondary | ICD-10-CM

## 2019-08-21 LAB — CUP PACEART REMOTE DEVICE CHECK
Battery Remaining Longevity: 11 mo
Battery Voltage: 2.87 V
Brady Statistic AP VP Percent: 0.04 %
Brady Statistic AP VS Percent: 0.01 %
Brady Statistic AS VP Percent: 99.91 %
Brady Statistic AS VS Percent: 0.05 %
Brady Statistic RA Percent Paced: 0.05 %
Brady Statistic RV Percent Paced: 99.92 %
Date Time Interrogation Session: 20201013073424
HighPow Impedance: 69 Ohm
Implantable Lead Implant Date: 20130123
Implantable Lead Implant Date: 20160316
Implantable Lead Implant Date: 20160316
Implantable Lead Location: 753858
Implantable Lead Location: 753859
Implantable Lead Location: 753860
Implantable Lead Model: 4598
Implantable Lead Model: 5076
Implantable Lead Model: 6935
Implantable Pulse Generator Implant Date: 20160316
Lead Channel Impedance Value: 1007 Ohm
Lead Channel Impedance Value: 1045 Ohm
Lead Channel Impedance Value: 1064 Ohm
Lead Channel Impedance Value: 399 Ohm
Lead Channel Impedance Value: 399 Ohm
Lead Channel Impedance Value: 475 Ohm
Lead Channel Impedance Value: 475 Ohm
Lead Channel Impedance Value: 551 Ohm
Lead Channel Impedance Value: 570 Ohm
Lead Channel Impedance Value: 646 Ohm
Lead Channel Impedance Value: 684 Ohm
Lead Channel Impedance Value: 874 Ohm
Lead Channel Impedance Value: 874 Ohm
Lead Channel Pacing Threshold Amplitude: 0.625 V
Lead Channel Pacing Threshold Amplitude: 0.625 V
Lead Channel Pacing Threshold Amplitude: 3 V
Lead Channel Pacing Threshold Pulse Width: 0.4 ms
Lead Channel Pacing Threshold Pulse Width: 0.4 ms
Lead Channel Pacing Threshold Pulse Width: 1 ms
Lead Channel Sensing Intrinsic Amplitude: 4 mV
Lead Channel Sensing Intrinsic Amplitude: 4 mV
Lead Channel Sensing Intrinsic Amplitude: 8.375 mV
Lead Channel Sensing Intrinsic Amplitude: 8.375 mV
Lead Channel Setting Pacing Amplitude: 2 V
Lead Channel Setting Pacing Amplitude: 2.5 V
Lead Channel Setting Pacing Amplitude: 3.5 V
Lead Channel Setting Pacing Pulse Width: 0.4 ms
Lead Channel Setting Pacing Pulse Width: 1 ms
Lead Channel Setting Sensing Sensitivity: 0.3 mV

## 2019-08-22 ENCOUNTER — Ambulatory Visit (INDEPENDENT_AMBULATORY_CARE_PROVIDER_SITE_OTHER): Payer: Medicare HMO

## 2019-08-22 ENCOUNTER — Other Ambulatory Visit: Payer: Self-pay

## 2019-08-22 DIAGNOSIS — Z95810 Presence of automatic (implantable) cardiac defibrillator: Secondary | ICD-10-CM

## 2019-08-22 DIAGNOSIS — I519 Heart disease, unspecified: Secondary | ICD-10-CM | POA: Diagnosis not present

## 2019-08-24 NOTE — Progress Notes (Signed)
EPIC Encounter for ICM Monitoring  Patient Name: Kevin [name] is a 73 y.o. male Date: 08/24/2019 Primary Care Physican: [name], MD Primary Cardiologist:[name] Electrophysiologist: [name] Bi-V Pacing: 99.9% 06/15/2019 Weight:185lbs   Transmission reviewed.   OptivolThoracic impedance normal.  Labs: 07/20/2019 Creatinine 1.22, BUN 14, Potassium 4.5, Sodium 140, GFR 58.12 02/27/2019 Creatinine 1.12, BUN 15, Potassium 4.6, Sodium 140, GFR 64.22  Patient does not take a diuretic.  Recommendations: None  Follow-up plan: ICM clinic phone appointment on 09/26/2019.   91 day device clinic remote transmission 11/20/2019.      Copy of ICM check sent to Dr. Rayann Heman.   3 month ICM trend: 08/21/2019    1 Year ICM trend:       Rosalene Billings, RN 08/24/2019 4:44 PM

## 2019-08-30 NOTE — Progress Notes (Signed)
Remote ICD transmission.   

## 2019-08-31 ENCOUNTER — Encounter (INDEPENDENT_AMBULATORY_CARE_PROVIDER_SITE_OTHER): Payer: Self-pay

## 2019-09-21 ENCOUNTER — Other Ambulatory Visit: Payer: Self-pay | Admitting: Endocrinology

## 2019-09-21 ENCOUNTER — Ambulatory Visit: Payer: Medicare HMO | Admitting: Endocrinology

## 2019-09-26 ENCOUNTER — Ambulatory Visit (INDEPENDENT_AMBULATORY_CARE_PROVIDER_SITE_OTHER): Payer: Medicare HMO

## 2019-09-26 DIAGNOSIS — Z95810 Presence of automatic (implantable) cardiac defibrillator: Secondary | ICD-10-CM | POA: Diagnosis not present

## 2019-09-26 DIAGNOSIS — I519 Heart disease, unspecified: Secondary | ICD-10-CM | POA: Diagnosis not present

## 2019-09-27 ENCOUNTER — Other Ambulatory Visit: Payer: Self-pay

## 2019-09-27 DIAGNOSIS — Z20822 Contact with and (suspected) exposure to covid-19: Secondary | ICD-10-CM

## 2019-09-28 ENCOUNTER — Telehealth: Payer: Self-pay

## 2019-09-28 NOTE — Telephone Encounter (Signed)
Remote ICM transmission received.  Attempted call to patient regarding ICM remote transmission and left detailed message per DPR.  Advised to return call for any fluid symptoms or questions.  

## 2019-09-28 NOTE — Progress Notes (Signed)
EPIC Encounter for ICM Monitoring  Patient Name: Kevin [name] is a 73 y.o. male Date: 09/28/2019 Primary Care Physican: [name], MD Primary Cardiologist:[name] Electrophysiologist: [name] Bi-V Pacing: 99.9% 06/15/2019 Weight:185lbs   Attempted call to patient and unable to reach.  Left detailed message per DPR regarding transmission. Transmission reviewed.   OptivolThoracic impedance suggesting possible fluid accumulation since 09/18/2019.  Labs: 07/20/2019 Creatinine 1.22, BUN 14, Potassium 4.5, Sodium 140, GFR 58.12 02/27/2019 Creatinine 1.12, BUN 15, Potassium 4.6, Sodium 140, GFR 64.22  Patient does not take a diuretic.  Recommendations: Left voice mail with ICM number and encouraged to call if experiencing any fluid symptoms..  Follow-up plan: ICM clinic phone appointment on 10/02/2019 to recheck fluid levels.   91 day device clinic remote transmission 11/20/2019.      Copy of ICM check sent to Dr. Sallyanne Kuster and Dr Rayann Heman.   3 month ICM trend: 09/26/2019    1 Year ICM trend:       Rosalene Billings, RN 09/28/2019 5:06 PM

## 2019-09-29 NOTE — Progress Notes (Signed)
All of them were so well behaved today! I thought I would send you one thank you note.

## 2019-09-30 LAB — NOVEL CORONAVIRUS, NAA: SARS-CoV-2, NAA: NOT DETECTED

## 2019-10-01 ENCOUNTER — Other Ambulatory Visit: Payer: Self-pay | Admitting: Family Medicine

## 2019-10-02 ENCOUNTER — Ambulatory Visit (INDEPENDENT_AMBULATORY_CARE_PROVIDER_SITE_OTHER): Payer: Medicare HMO

## 2019-10-02 DIAGNOSIS — I5042 Chronic combined systolic (congestive) and diastolic (congestive) heart failure: Secondary | ICD-10-CM

## 2019-10-02 DIAGNOSIS — Z95810 Presence of automatic (implantable) cardiac defibrillator: Secondary | ICD-10-CM

## 2019-10-03 NOTE — Progress Notes (Signed)
EPIC Encounter for ICM Monitoring  Patient Name: Kevin [name] is a 73 y.o. male Date: 10/03/2019 Primary Care Physican: [name], MD Primary Cardiologist:[name] Electrophysiologist: [name] Bi-V Pacing: 99.9% Last Weight:185lbs   Transmission reviewed.   OptivolThoracic impedance returned to normal.  Labs: 07/20/2019 Creatinine 1.22, BUN 14, Potassium 4.5, Sodium 140, GFR 58.12 02/27/2019 Creatinine 1.12, BUN 15, Potassium 4.6, Sodium 140, GFR 64.22  Patient does not take a diuretic.  Recommendations: None  Follow-up plan: ICM clinic phone appointment on 11/21/2019.   91 day device clinic remote transmission 11/20/2019.      Copy of ICM check sent to Dr. Sallyanne Kuster and Dr Rayann Heman.  3 month ICM trend: 10/02/2019    1 Year ICM trend:       Rosalene Billings, RN 10/03/2019 2:23 PM

## 2019-10-16 ENCOUNTER — Telehealth: Payer: Self-pay | Admitting: Family Medicine

## 2019-10-16 NOTE — Telephone Encounter (Signed)
Pt states that he thinks that his insurance will cover the blood glucose meter that you read on your arm.  Pt isn't sure what it is called. Pt uses  [facility] [place] [phone] (Phone) [phone] (Fax)

## 2019-10-16 NOTE — Telephone Encounter (Signed)
Dr Dwyane Dee has been following.

## 2019-10-16 NOTE — Telephone Encounter (Signed)
Are you seeing the patient for his diabetes or is Dr. Dwyane Dee?

## 2019-10-17 ENCOUNTER — Encounter: Payer: Self-pay | Admitting: Neurology

## 2019-10-17 NOTE — Telephone Encounter (Signed)
Is highly unlikely that insurance will cover freestyle libre with his not taking insulin or checking his sugar on his own.  He may need to have labs again before his next visit as they are outdated

## 2019-10-17 NOTE — Telephone Encounter (Signed)
Please review below

## 2019-10-17 NOTE — Telephone Encounter (Signed)
Are you okay with patient having a CGM?

## 2019-10-17 NOTE — Telephone Encounter (Signed)
Dr. Dwyane Dee is following this patient for diabetic requests.

## 2019-10-18 ENCOUNTER — Other Ambulatory Visit: Payer: Self-pay

## 2019-10-18 ENCOUNTER — Telehealth (INDEPENDENT_AMBULATORY_CARE_PROVIDER_SITE_OTHER): Payer: Medicare HMO | Admitting: Neurology

## 2019-10-18 VITALS — Ht 76.0 in | Wt 184.0 lb

## 2019-10-18 DIAGNOSIS — G3184 Mild cognitive impairment, so stated: Secondary | ICD-10-CM

## 2019-10-18 DIAGNOSIS — F419 Anxiety disorder, unspecified: Secondary | ICD-10-CM

## 2019-10-18 DIAGNOSIS — R69 Illness, unspecified: Secondary | ICD-10-CM | POA: Diagnosis not present

## 2019-10-18 MED ORDER — DONEPEZIL HCL 5 MG PO TABS: ORAL_TABLET | ORAL | 3 refills | Status: DC

## 2019-10-18 MED ORDER — BUPROPION HCL ER (XL) 150 MG PO TB24: ORAL_TABLET | ORAL | 3 refills | Status: DC

## 2019-10-18 NOTE — Progress Notes (Signed)
Virtual Visit via Video Note The purpose of this virtual visit is to provide medical care while limiting exposure to the novel coronavirus.    Consent was obtained for video visit:  Yes.   Answered questions that patient had about telehealth interaction:  Yes.   I discussed the limitations, risks, security and privacy concerns of performing an evaluation and management service by telemedicine. I also discussed with the patient that there may be a patient responsible charge related to this service. The patient expressed understanding and agreed to proceed.  Pt location: Home Physician Location: office Name of referring provider:  [name], MD I connected with Ferdinand Cava at patients initiation/request on 10/18/2019 at  8:30 AM EST by video enabled telemedicine application and verified that I am speaking with the correct person using two identifiers. Pt MRN:  [id number] Pt DOB:  [birth year] Video Participants:  Ferdinand Cava;  [name] Andreski (spouse)   History of Present Illness:  The patient was seen as a virtual video visit on 10/18/2019. He was last seen in the neurology clinic 8 months ago for worsening memory and dizziness. MOCA score 17/30 in April 2020. On his last visit, he reported nausea and more dizziness on Donepezil 10mg . Dose reduced to 5mg  daily, which he is tolerating better without side effects. He feels his memory is "not any better, not any worse." He denies getting lost driving. He denies missing medications, his wife fills his pillbox. They do bills together. He reports that the [place] ordered a head CT and told him he may have had a stroke at some point, he will have repeat imaging done at the end of the month. He denies any stroke-like symptoms, no focal weakness, speech difficulties, loss of vision. He reported a transient episode of diplopia while driving last April, this has not recurred. He has had a few dizzy spells with associated tinnitus, hearing aids  have helped drastically. Sleep and mood are good, but his wife reports he continues to get frustrated easily. No improvement with re-initiation of Wellbutrin 150mg  daily, no side effects.   History on Initial Assessment 07/14/2018: This is a pleasant 73 year old right-handed man with a history of hypertension, hyperlipidemia, diabetes, prostrate cancer, NICM, second degree AV block s/p PPM, presenting for evaluation of memory changes and dizziness. He and his wife disagree several times during the visit about what each other says. His wife reports dizziness started 2 years ago and that he complains about it daily to her. He states he does not have it everyday and does not think it's been going on for 2 years. It only occurs when standing, he is fine sitting/supine. Dizziness is described as lightheadedness, he used to play golf but stopped playing because he would transiently feel dizzy. He then reports that he tried to play golf 4-5 weeks ago and did not feel dizzy at all. His wife has checked his BP and glucose during these times and found them normal. He feels that when he takes his glasses off, the dizziness stops. He had a different type of dizziness the other night when he bent down to brush his teeth and started having a spinning sensation like he would fall. His wife helped him to sit on the commode and her threw up 3 times. Vertigo lasted 5 minutes. No associated focal symptoms. He fell yesterday but states this is not a regular thing. He denies any headaches, diplopia, dysarthria/dysphagia, neck/back pain, focal numbness/tingling/weakness, bowel/bladder dysfunction, anosmia, or  tremors. He has tinnitus and has seen ENT, he reports that chelated magnesium and turmeric help.  When asked about memory, he reports he gets frustrated trying to remember names, then it eventually comes to him. His wife started noticing changes 2 years ago where he would repeat himself. She will be going back to work and the  other day he asked her at least 5 times if she was working that day. He misplaces things frequently, having a bill or his checkbook and unable to recall where he lay it down. He volunteers at [facility] and could not remember someone's name yesterday. He continues to drive and denies getting lost, but his wife shakes her head and states he would be driving and forget how to get to his destination, which frustrates him since he has lived in [place] for a long time. They manage bills together, his wife denies any difficulties. His wife fixes his pillbox, he takes his medications overall fine but sometimes puts them in a packet and takes it later on. He is independent with dressing and bathing. His wife has noticed increased irritability, they have been married for 5 years. No paranoia or hallucinations. No wandering behavior, he sleeps well with melatonin. He takes prn Ativan only when "really frustrated." He is a retired Cytogeneticist. No family history of dementia. He was knocked down by shrapnel in [place]. He very seldom drinks alcohol. MMSE 27/30 in October 2018.   Current Outpatient Medications on File Prior to Visit  Medication Sig Dispense Refill  . ACCU-CHEK COMPACT PLUS test strip use 1 TEST STRIP once daily as directed 102 each 5  . atorvastatin (LIPITOR) 80 MG tablet Take 40 mg by mouth daily at 6 PM.    . buPROPion (WELLBUTRIN XL) 150 MG 24 hr tablet Take 1 tablet (150 mg total) by mouth daily. 90 tablet 0  . carvedilol (COREG) 12.5 MG tablet Take 6.25 mg by mouth 2 (two) times daily with a meal.    . CHELATED MAGNESIUM PO Take 250 mg by mouth daily.    . clobetasol ointment (TEMOVATE) [id number] % Apply 1 application topically daily as needed (itching).     Marland Kitchen donepezil (ARICEPT) 5 MG tablet Take 1/2 tablet daily for 2 weeks, then increase to 1 tablet daily 30 tablet 11  . JANUMET XR [phone] MG TB24 TAKE 1 TABLET BY MOUTH DAILY 30 tablet 4  . latanoprost (XALATAN) 0.005 % ophthalmic solution Place 1  drop into the right eye daily.     Marland Kitchen LORazepam (ATIVAN) 0.5 MG tablet Take 0.5 mg by mouth every 8 (eight) hours. As needed    . VIAGRA 100 MG tablet TAKE ONE TABLET BY MOUTH AS NEEDED FOR ERECTILE DYSFUNCTION. 10 tablet 3   No current facility-administered medications on file prior to visit.     Observations/Objective:   Vitals:   10/17/19 1604  Weight: 184 lb (83.5 kg)  Height: 6\' 4"  (1.93 m)   GEN:  The patient appears stated age and is in NAD.  Neurological examination: Patient is awake, alert, oriented x 3. No aphasia or dysarthria. Intact fluency and comprehension. Remote and recent memory impaired. SLUMS score 18/30. Cranial nerves: Extraocular movements intact with no nystagmus. No facial asymmetry. Motor: moves all extremities symmetrically, at least anti-gravity x 4.  [employer] Mental Exam 10/18/2019  Weekday Correct 1  Current year 1  What state are we in? 1  Amount spent 1  Amount left 2  # of Animals 1  5  objects recall 0  Number series 1  Hour markers 2  Time correct 2  Placed X in triangle correctly 1  Largest Figure 1  Name of male 2  Date back to work 0  Type of work 2  State she lived in 0  Total score 18    Assessment and Plan:   This is a pleasant 73 yo RH man with a history of  hypertension, hyperlipidemia, diabetes, prostrate cancer, NICM, second degree AV block s/p PPM, with worsening memory and dizziness. Head CT no acute changes. SLUMS score today 18/30 ([place] 17/30 in April 2020, 24/30 in September 2019). He is tolerating Donepezil 5mg  daily better. He continues to deny any difficulties with complex tasks, indicating Mild Cognitive Impairment. He will be having a repeat head CT at the [facility] for report of possible stroke on recent head CT, he denies any stroke-like symptoms. He knows to go to the ER immediately for any sudden change in symptoms. Continue control of vascular risk factors. We discussed increasing Wellbutrin 150mg  to 2 tablets daily  to help with mood/increased frustration. He will follow-up in 6 months and knows to call for any changes.    Follow Up Instructions:   -I discussed the assessment and treatment plan with the patient. The patient was provided an opportunity to ask questions and all were answered. The patient agreed with the plan and demonstrated an understanding of the instructions.   The patient was advised to call back or seek an in-person evaluation if the symptoms worsen or if the condition fails to improve as anticipated.     Cameron Sprang, MD

## 2019-10-22 NOTE — Progress Notes (Signed)
Key: [id number] - PA Case ID: [id number] - Rx #[id number] prior authorization submitted via covermymeds.  Waiting response.

## 2019-10-30 ENCOUNTER — Telehealth: Payer: Self-pay | Admitting: Endocrinology

## 2019-10-30 NOTE — Telephone Encounter (Signed)
Returned patients phone call and got blood sugar readings for tomorrows telephone call visit.

## 2019-10-30 NOTE — Telephone Encounter (Signed)
Patient requests to be called at ph# [phone] to give Blood sugar readings prior to appointment 10/31/19

## 2019-10-31 ENCOUNTER — Other Ambulatory Visit: Payer: Self-pay

## 2019-10-31 ENCOUNTER — Ambulatory Visit: Payer: Medicare HMO | Admitting: Endocrinology

## 2019-10-31 MED ORDER — FREESTYLE LIBRE 14 DAY SENSOR MISC: 2 refills | Status: DC

## 2019-10-31 MED ORDER — FREESTYLE LIBRE 14 DAY READER DEVI: 1.0000 | 0 refills | Status: DC

## 2019-11-20 ENCOUNTER — Ambulatory Visit (INDEPENDENT_AMBULATORY_CARE_PROVIDER_SITE_OTHER): Payer: Medicare HMO | Admitting: *Deleted

## 2019-11-20 DIAGNOSIS — I495 Sick sinus syndrome: Secondary | ICD-10-CM | POA: Diagnosis not present

## 2019-11-20 LAB — CUP PACEART REMOTE DEVICE CHECK
Battery Remaining Longevity: 9 mo
Battery Voltage: 2.85 V
Brady Statistic AP VP Percent: 0.05 %
Brady Statistic AP VS Percent: 0.01 %
Brady Statistic AS VP Percent: 99.92 %
Brady Statistic AS VS Percent: 0.03 %
Brady Statistic RA Percent Paced: 0.05 %
Brady Statistic RV Percent Paced: 99.93 %
Date Time Interrogation Session: 20210112012303
HighPow Impedance: 78 Ohm
Implantable Lead Implant Date: 20130123
Implantable Lead Implant Date: 20160316
Implantable Lead Implant Date: 20160316
Implantable Lead Location: 753858
Implantable Lead Location: 753859
Implantable Lead Location: 753860
Implantable Lead Model: 4598
Implantable Lead Model: 5076
Implantable Lead Model: 6935
Implantable Pulse Generator Implant Date: 20160316
Lead Channel Impedance Value: 1083 Ohm
Lead Channel Impedance Value: 1083 Ohm
Lead Channel Impedance Value: 1178 Ohm
Lead Channel Impedance Value: 399 Ohm
Lead Channel Impedance Value: 437 Ohm
Lead Channel Impedance Value: 437 Ohm
Lead Channel Impedance Value: 513 Ohm
Lead Channel Impedance Value: 551 Ohm
Lead Channel Impedance Value: 570 Ohm
Lead Channel Impedance Value: 646 Ohm
Lead Channel Impedance Value: 760 Ohm
Lead Channel Impedance Value: 855 Ohm
Lead Channel Impedance Value: 874 Ohm
Lead Channel Pacing Threshold Amplitude: 0.5 V
Lead Channel Pacing Threshold Amplitude: 0.5 V
Lead Channel Pacing Threshold Amplitude: 2.375 V
Lead Channel Pacing Threshold Pulse Width: 0.4 ms
Lead Channel Pacing Threshold Pulse Width: 0.4 ms
Lead Channel Pacing Threshold Pulse Width: 1 ms
Lead Channel Sensing Intrinsic Amplitude: 5.375 mV
Lead Channel Sensing Intrinsic Amplitude: 5.375 mV
Lead Channel Sensing Intrinsic Amplitude: 8.375 mV
Lead Channel Sensing Intrinsic Amplitude: 8.375 mV
Lead Channel Setting Pacing Amplitude: 2 V
Lead Channel Setting Pacing Amplitude: 2.5 V
Lead Channel Setting Pacing Amplitude: 3.5 V
Lead Channel Setting Pacing Pulse Width: 0.4 ms
Lead Channel Setting Pacing Pulse Width: 1 ms
Lead Channel Setting Sensing Sensitivity: 0.3 mV

## 2019-11-21 ENCOUNTER — Ambulatory Visit (INDEPENDENT_AMBULATORY_CARE_PROVIDER_SITE_OTHER): Payer: Medicare HMO

## 2019-11-21 DIAGNOSIS — Z95810 Presence of automatic (implantable) cardiac defibrillator: Secondary | ICD-10-CM

## 2019-11-21 DIAGNOSIS — I5042 Chronic combined systolic (congestive) and diastolic (congestive) heart failure: Secondary | ICD-10-CM | POA: Diagnosis not present

## 2019-11-26 NOTE — Progress Notes (Signed)
EPIC Encounter for ICM Monitoring  Patient Name: Kevin [name] is a 74 y.o. male Date: 11/26/2019 Primary Care Physican: [name], MD Primary Cardiologist:[name] Electrophysiologist: [name] Bi-V Pacing: 99.9% Last Weight:185lbs   Attempted call to patient and unable to reach.  Left detailed message per DPR regarding transmission. Transmission reviewed.   OptivolThoracic impedancenormal.  Labs: 07/20/2019 Creatinine 1.22, BUN 14, Potassium 4.5, Sodium 140, GFR 58.12 02/27/2019 Creatinine 1.12, BUN 15, Potassium 4.6, Sodium 140, GFR 64.22  Patient does not take a diuretic.  Recommendations:Left voice mail with ICM number and encouraged to call if experiencing any fluid symptoms.  Follow-up plan: ICM clinic phone appointment on 12/24/2019. 91 day device clinic remote transmission 02/19/2020.   Copy of ICM check sent to Dr.[name] and Dr Rayann Heman.  3 month ICM trend: 11/20/2019    1 Year ICM trend:       Rosalene Billings, RN 11/26/2019 3:41 PM

## 2019-11-29 ENCOUNTER — Other Ambulatory Visit: Payer: Self-pay | Admitting: Endocrinology

## 2019-11-29 DIAGNOSIS — E1165 Type 2 diabetes mellitus with hyperglycemia: Secondary | ICD-10-CM

## 2019-11-30 ENCOUNTER — Other Ambulatory Visit (INDEPENDENT_AMBULATORY_CARE_PROVIDER_SITE_OTHER): Payer: Medicare HMO

## 2019-11-30 ENCOUNTER — Other Ambulatory Visit: Payer: Self-pay

## 2019-11-30 DIAGNOSIS — E1165 Type 2 diabetes mellitus with hyperglycemia: Secondary | ICD-10-CM | POA: Diagnosis not present

## 2019-11-30 LAB — BASIC METABOLIC PANEL
BUN: 15 mg/dL (ref 6–23)
CO2: 27 mEq/L (ref 19–32)
Calcium: 9.4 mg/dL (ref 8.4–10.5)
Chloride: 104 mEq/L (ref 96–112)
Creatinine, Ser: 1.19 mg/dL (ref 0.40–1.50)
GFR: 59.76 mL/min — ABNORMAL LOW (ref >60.00)
Glucose, Bld: 107 mg/dL — ABNORMAL HIGH (ref 70–99)
Potassium: 4.7 mEq/L (ref 3.5–5.1)
Sodium: 139 mEq/L (ref 135–145)

## 2019-11-30 LAB — HEMOGLOBIN A1C: Hgb A1c MFr Bld: 6.5 % (ref 4.6–6.5)

## 2019-12-03 ENCOUNTER — Other Ambulatory Visit: Payer: Self-pay

## 2019-12-03 ENCOUNTER — Encounter: Payer: Self-pay | Admitting: Endocrinology

## 2019-12-03 ENCOUNTER — Ambulatory Visit (INDEPENDENT_AMBULATORY_CARE_PROVIDER_SITE_OTHER): Payer: Medicare HMO | Admitting: Endocrinology

## 2019-12-03 DIAGNOSIS — E119 Type 2 diabetes mellitus without complications: Secondary | ICD-10-CM

## 2019-12-03 DIAGNOSIS — E7800 Pure hypercholesterolemia, unspecified: Secondary | ICD-10-CM | POA: Diagnosis not present

## 2019-12-03 NOTE — Progress Notes (Signed)
Patient ID: Kevin [name], male   DOB: 26-Apr-1946, 74 y.o.   MRN: [id number]    Reason for Appointment:  Followup for Type 2 Diabetes  Today's office visit was provided via telemedicine using a telephone call to the patient Patient has been explained the limitations of evaluation and management by telemedicine and the availability of in person appointments.  The patient understood the limitations and agreed to proceed. Patient also understood that the telehealth visit is billable. . Location of the patient: Home . Location of the provider: Office Only the patient and myself were participating in the encounter   History of Present Illness:          Diagnosis: Type 2 diabetes mellitus, date of diagnosis: 2001        Past history: His previous records are not available but initially his symptoms included weight loss He thinks he has been on metformin for several years and more recently has been taking 1000 mg twice a day [facility] His A1c was 6.9 in 2013 but a year later was 8.1 He had been on glipizide 5 mg twice a day in addition to his metformin 1 g twice a day   Prior to his consultation when his A1c was 10% and he was given Januvia in addition  Recent history:   Oral hypoglycemic drugs the patient is taking [id number] XR 100/1000 1 tablet daily  His A1c is 6.5 and consistently under 7  Current management, blood sugar patterns and problems identified:   Janumet prescription was changed to 100/1000 insulin 50/1000  He is not taking this prescription into the [facility] of [facility]  He has started using a FreeStyle meter but not clear if these readings are accurate as they are higher than in the lab  Most of his blood sugars are being done before meals  He usually tries to walk but has not done so very much lately with cold weather  Also his grades appears to be about the same   Although he is avoiding a lot of high-fat meals and carbohydrates he is eating mostly cereal and banana in the morning  No GI side effects from Janumet       Side effects from medications have been: None  Glucose monitoring:  done 1 or less times a day         Glucometer:  Freestyle     Blood Glucose readings from patient reporting:   PRE-MEAL  morning  midday Dinner Bedtime Overall  Glucose range:  104-141  112, 157  116-140    Mean/median:     ?    Glycemic control:    Lab Results  Component Value Date   HGBA1C 6.5 11/30/2019   HGBA1C 6.7 (H) 07/20/2019   HGBA1C 6.6 (H) 02/27/2019   Lab Results  Component Value Date   MICROALBUR 2.3 (H) 07/20/2019   LDLCALC 64 07/20/2019   CREATININE 1.19 11/30/2019     Self-care: The diet that the patient has been following is: tries to limit portions, may get fried food when eating out, some fast food    Meals: 3 meals per day.  breakfast is at 7-8 am and is usually eating  special K. cereal Usually eating baked chicken or chicken salad sandwiches for meals and will have fruits for snacks          Exercise:    up to 10 miles a week usually  Dietician visit: Most recent:2000              Weight history:  Wt Readings from Last 3 Encounters:  10/17/19 184 lb (83.5 kg)  06/15/19 185 lb (83.9 kg)  06/06/19 187 lb 8 oz (85 kg)    OTHER active problems: See Review of systems  Lab on 11/30/2019  Component Date Value Ref Range Status  . Sodium 11/30/2019 139  135 - 145 mEq/L Final  . Potassium 11/30/2019 4.7  3.5 - 5.1 mEq/L Final  . Chloride 11/30/2019 104  96 - 112 mEq/L Final  . CO2 11/30/2019 27  19 - 32 mEq/L Final  . Glucose, Bld 11/30/2019 107* 70 - 99 mg/dL Final  . BUN 11/30/2019 15  6 - 23 mg/dL Final  . Creatinine, Ser 11/30/2019 1.19  0.40 - 1.50 mg/dL Final  . GFR 11/30/2019 59.76* >60.00 mL/min Final  . Calcium 11/30/2019 9.4  8.4 - 10.5 mg/dL Final  . Hgb A1c MFr Bld 11/30/2019 6.5  4.6 - 6.5 % Final    Glycemic Control Guidelines for People with Diabetes:Non Diabetic:  <6%Goal of Therapy: <7%Additional Action Suggested:  >8%     Allergies as of 12/03/2019      Reactions   Sulfa Antibiotics Swelling   Swelling of lips only.      Medication List       Accurate as of December 03, 2019  9:39 AM. If you have any questions, ask your nurse or doctor.        Accu-Chek Compact Plus test strip Generic drug: glucose blood use 1 TEST STRIP once daily as directed   atorvastatin 80 MG tablet Commonly known as: LIPITOR Take 40 mg by mouth daily at 6 PM.   buPROPion 150 MG 24 hr tablet Commonly known as: WELLBUTRIN XL Take 2 tablets daily   carvedilol 12.5 MG tablet Commonly known as: COREG Take 6.25 mg by mouth 2 (two) times daily with a meal.   CHELATED MAGNESIUM PO Take 250 mg by mouth daily.   clobetasol ointment 0.05 % Commonly known as: TEMOVATE Apply 1 application topically daily as needed (itching).   donepezil 5 MG tablet Commonly known as: ARICEPT Take 1 tablet daily   FreeStyle Libre 14 Day Reader Kerrin Mo 1 each by Does not apply route See admin instructions. Use Freestyle Libre Reader to monitor blood sugars.   FreeStyle Libre 14 Day Sensor Misc Use Freestyle Libre Sensor to monitor blood sugars.   Janumet XR [phone] MG Tb24 Generic drug: SitaGLIPtin-MetFORMIN HCl TAKE 1 TABLET BY MOUTH DAILY   latanoprost 0.005 % ophthalmic solution Commonly known as: XALATAN Place 1 drop into the right eye daily.   LORazepam 0.5 MG tablet Commonly known as: ATIVAN Take 0.5 mg by mouth every 8 (eight) hours. As needed   Viagra 100 MG tablet Generic drug: sildenafil TAKE ONE TABLET BY MOUTH AS NEEDED FOR ERECTILE DYSFUNCTION.       Allergies:  Allergies  Allergen Reactions  . Sulfa Antibiotics Swelling    Swelling of lips only.    Past Medical History:  Diagnosis Date  . Agent orange exposure    in [place]  . Anemia   . Heart block AV second degree    a. s/p  PPM implant with subsequent CRTD upgrade  . High cholesterol   . History of colon polyps   . Hypertension   . LBBB (left bundle branch block)   . Nonischemic cardiomyopathy ([place])    a. MDT CRTD  upgrade 2016  . Pacemaker 08/27/2013   Dual-chamber Medtronic Adapta implanted January 2013 for bradycardia with alternating bundle branch block and 2:1 atrioventricular block   . Prostate cancer ([place])    "not been treated yet; on a wait and see" (01/22/2015)  . Type II diabetes mellitus ([place])     Past Surgical History:  Procedure Laterality Date  . BASAL CELL CARCINOMA EXCISION     "back, face, neck"  . BI-VENTRICULAR IMPLANTABLE CARDIOVERTER DEFIBRILLATOR UPGRADE N/A 01/22/2015   MDT CRTD upgrade by Dr Rayann Heman  . CARDIAC CATHETERIZATION  01/27/2012   normal coronaries, dilated LV c/w nonischemic cardiomyopathy  . EXAMINATION UNDER ANESTHESIA  04/06/2012   Procedure: EXAM UNDER ANESTHESIA;  Surgeon: Marcello Moores A. Cornett, MD;  Location: [place];  Service: General;  Laterality: N/A;  . INCISION AND DRAINAGE PERIRECTAL ABSCESS  ~ 2010; 2015  . INGUINAL HERNIA REPAIR Left 1980  . LEFT HEART CATHETERIZATION WITH CORONARY ANGIOGRAM N/A 01/27/2012   Procedure: LEFT HEART CATHETERIZATION WITH CORONARY ANGIOGRAM;  Surgeon: Troy Sine, MD;  Location: [facility] CATH LAB;  Service: Cardiovascular;  Laterality: N/A;  . NM MYOCAR PERF WALL MOTION  01/21/2012   mod-severe perfusion defect due to infarct/scar w/mild perinfarct ischemia in the apical,basal inferoseptal,basal inferior,mid inferoseptal,midinferior and apical inferior regions  . PERMANENT PACEMAKER INSERTION N/A 12/01/2011   Medtronic implanted by Dr Sallyanne Kuster  . PROSTATE BIOPSY    . PROSTATE BIOPSY    . shrapnel surgery     "in [place]"  . TONSILLECTOMY      Family History  Problem Relation Age of Onset  . Ovarian cancer Mother   . Leukemia Father   . Heart disease Father   . Diabetes Neg Hx     Social History:  reports that he quit smoking about  35 years ago. His smoking use included cigarettes. He has a 20.00 pack-year smoking history. He has never used smokeless tobacco. He reports current alcohol use. He reports that he does not use drugs.    Review of Systems  .   He has eye exams every 6 months regularly at the [facility], again no records available      Lipids: He has been treated with Lipitor 40 mg with adequate control       Lab Results  Component Value Date   CHOL 125 07/20/2019   HDL 34.10 (L) 07/20/2019   LDLCALC 64 07/20/2019   LDLDIRECT 58.6 09/24/2014   TRIG 134.0 07/20/2019   CHOLHDL 4 07/20/2019     Hypertension with history of CHF: Is on carvedilol as before  BP Readings from Last 3 Encounters:  06/15/19 140/80  06/06/19 122/68  03/05/19 138/73         Physical Examination:    There were no vitals taken for this visit.  ASSESSMENT/PLAN:     Diabetes type 2, Nonobese  See history of present illness for detailed discussion of current diabetes management, blood sugar patterns and problems identified     His A1c is 6.5 although has been as low as 6.1  Now taking 100/1000 Janumet Usually concerned about the cost of medications and does not like to take brand-name medications unless covered by the [facility]  His blood sugars are fairly good at home although higher at times even in the morning His meter has not been checked for accuracy  Recommendations:  Try to check readings more after eating about 2 hours later  Have a protein like boiled egg, cheese or nuts instead of  banana with a smaller portion of cereal in the morning  Restart walking, he plans to go back to the gym Bring monitor for download and comparison on the next visit Consider using an SGLT2 drug instead of Januvia with his cardiac history, will discuss on his upcoming visit when he can come into the office  He will try to get a copy of his eye exam from the [facility]  Hypertension: To continue  follow-up with cardiologist/PCP  Renal function normal with GFR around 60  There are no Patient Instructions on file for this visit.  Elayne Snare 12/03/2019, 9:39 AM   Duration of telephone encounter =8 minutes   Note: This office note was prepared with Dragon voice recognition system technology. Any transcriptional errors that result from this process are unintentional.

## 2019-12-24 ENCOUNTER — Ambulatory Visit (INDEPENDENT_AMBULATORY_CARE_PROVIDER_SITE_OTHER): Payer: Medicare HMO

## 2019-12-24 DIAGNOSIS — I5042 Chronic combined systolic (congestive) and diastolic (congestive) heart failure: Secondary | ICD-10-CM

## 2019-12-24 DIAGNOSIS — Z95810 Presence of automatic (implantable) cardiac defibrillator: Secondary | ICD-10-CM

## 2019-12-28 DIAGNOSIS — I5042 Chronic combined systolic (congestive) and diastolic (congestive) heart failure: Secondary | ICD-10-CM | POA: Diagnosis not present

## 2019-12-28 DIAGNOSIS — Z95810 Presence of automatic (implantable) cardiac defibrillator: Secondary | ICD-10-CM | POA: Diagnosis not present

## 2019-12-28 NOTE — Progress Notes (Signed)
EPIC Encounter for ICM Monitoring  Patient Name: Kevin [name] is a 74 y.o. male Date: 12/28/2019 Primary Care Physican: [name], MD Primary Cardiologist:[name] Electrophysiologist: [name] Bi-V Pacing: 99.7% LastWeight:185lbs   Transmission reviewed.   OptivolThoracic impedancenormal.  Labs: 11/30/2019 Creatinine 1.19, BUN 27, Potassium 4.7, Sodium 139, GFR 59.76 07/20/2019 Creatinine 1.22, BUN 14, Potassium 4.5, Sodium 140, GFR 58.12 02/27/2019 Creatinine 1.12, BUN 15, Potassium 4.6, Sodium 140, GFR 64.22  Patient does not take a diuretic.  Recommendations:None  Follow-up plan: ICM clinic phone appointment on3/22/2021. 91 day device clinic remote transmission 02/19/2020.   Copy of ICM check sent to Dr.[name]  3 month ICM trend: 12/24/2019    1 Year ICM trend:       Rosalene Billings, RN 12/28/2019 8:50 AM

## 2020-01-02 ENCOUNTER — Telehealth: Payer: Self-pay

## 2020-01-07 ENCOUNTER — Telehealth (INDEPENDENT_AMBULATORY_CARE_PROVIDER_SITE_OTHER): Payer: Medicare HMO | Admitting: Internal Medicine

## 2020-01-07 ENCOUNTER — Encounter: Payer: Self-pay | Admitting: Internal Medicine

## 2020-01-07 VITALS — BP 115/72 | Ht 76.0 in | Wt 188.0 lb

## 2020-01-07 DIAGNOSIS — I519 Heart disease, unspecified: Secondary | ICD-10-CM

## 2020-01-07 DIAGNOSIS — I428 Other cardiomyopathies: Secondary | ICD-10-CM | POA: Diagnosis not present

## 2020-01-07 DIAGNOSIS — I119 Hypertensive heart disease without heart failure: Secondary | ICD-10-CM

## 2020-01-07 DIAGNOSIS — I10 Essential (primary) hypertension: Secondary | ICD-10-CM

## 2020-01-07 DIAGNOSIS — I442 Atrioventricular block, complete: Secondary | ICD-10-CM

## 2020-01-07 NOTE — Progress Notes (Signed)
Electrophysiology TeleHealth Note  Due to national recommendations of social distancing due to [place] 19, an audio telehealth visit is felt to be most appropriate for this patient at this time.  Verbal consent was obtained by me for the telehealth visit today.  The patient does not have capability for a virtual visit.  A phone visit is therefore required today.   Date:  01/07/2020   ID:  Kevin [name], DOB 25-Aug-1946, MRN [id number]  Location: patient's home  Provider location:  [place]  Evaluation Performed: Follow-up visit  PCP:  [name], MD   Electrophysiologist:  Dr Rayann Heman  Chief Complaint:  palpitations  History of Present Illness:    Kevin [name] is a 74 y.o. male who presents via telehealth conferencing today.  Since last being seen in our clinic, the patient reports doing very well.  Today, he denies symptoms of palpitations, chest pain, shortness of breath,  lower extremity edema, or ICD shocks.  He has occasional dizziness/ unsteadiness.  This has recently led to a fall 1 week ago.  He did hit his head but denies and residual symptoms.  He has had "inner ear" issues previously and feels that this was similar.  He has chronic tinnitus.  He follows with the [facility] for this.  He denies presyncope or syncope.  The patient is otherwise without complaint today.  The patient denies symptoms of fevers, chills, cough, or new SOB worrisome for COVID 19.  Past Medical History:  Diagnosis Date  . Agent orange exposure    in [place]  . Anemia   . Heart block AV second degree    a. s/p PPM implant with subsequent CRTD upgrade  . High cholesterol   . History of colon polyps   . Hypertension   . LBBB (left bundle branch block)   . Nonischemic cardiomyopathy ([place])    a. MDT CRTD upgrade 2016  . Pacemaker 08/27/2013   Dual-chamber Medtronic Adapta implanted January 2013 for bradycardia with alternating bundle branch block and 2:1 atrioventricular block   .  Prostate cancer ([place])    "not been treated yet; on a wait and see" (01/22/2015)  . Type II diabetes mellitus ([place])     Past Surgical History:  Procedure Laterality Date  . BASAL CELL CARCINOMA EXCISION     "back, face, neck"  . BI-VENTRICULAR IMPLANTABLE CARDIOVERTER DEFIBRILLATOR UPGRADE N/A 01/22/2015   MDT CRTD upgrade by Dr Rayann Heman  . CARDIAC CATHETERIZATION  01/27/2012   normal coronaries, dilated LV c/w nonischemic cardiomyopathy  . EXAMINATION UNDER ANESTHESIA  04/06/2012   Procedure: EXAM UNDER ANESTHESIA;  Surgeon: Marcello Moores A. Cornett, MD;  Location: [facility];  Service: General;  Laterality: N/A;  . INCISION AND DRAINAGE PERIRECTAL ABSCESS  ~ 2010; 2015  . INGUINAL HERNIA REPAIR Left 1980  . LEFT HEART CATHETERIZATION WITH CORONARY ANGIOGRAM N/A 01/27/2012   Procedure: LEFT HEART CATHETERIZATION WITH CORONARY ANGIOGRAM;  Surgeon: Troy Sine, MD;  Location: [facility] C[facility];  Service: Cardiovascular;  Laterality: N/A;  . NM MYOCAR PERF WALL MOTION  01/21/2012   mod-severe perfusion defect due to infarct/scar w/mild perinfarct ischemia in the apical,basal inferoseptal,basal inferior,mid inferoseptal,midinferior and apical inferior regions  . PERMANENT PACEMAKER INSERTION N/A 12/01/2011   Medtronic implanted by Dr Sallyanne Kuster  . PROSTATE BIOPSY    . PROSTATE BIOPSY    . shrapnel surgery     "in [place]"  . TONSILLECTOMY      Current Outpatient Medications  Medication Sig  Dispense Refill  . ACCU-CHEK COMPACT PLUS test strip use 1 TEST STRIP once daily as directed 102 each 5  . atorvastatin (LIPITOR) 80 MG tablet Take 40 mg by mouth daily at 6 PM.    . buPROPion (WELLBUTRIN XL) 150 MG 24 hr tablet Take 2 tablets daily 180 tablet 3  . carvedilol (COREG) 12.5 MG tablet Take 6.25 mg by mouth 2 (two) times daily with a meal.    . CHELATED MAGNESIUM PO Take 250 mg by mouth daily.    . clobetasol ointment (TEMOVATE) [id number] % Apply 1 application topically daily as needed (itching).     . Continuous  Blood Gluc Receiver (FREESTYLE LIBRE 14 DAY READER) DEVI 1 each by Does not apply route See admin instructions. Use Freestyle Libre Reader to monitor blood sugars. 1 each 0  . Continuous Blood Gluc Sensor (FREESTYLE LIBRE 14 DAY SENSOR) MISC Use Freestyle Libre Sensor to monitor blood sugars. 2 each 2  . donepezil (ARICEPT) 5 MG tablet Take 1 tablet daily 90 tablet 3  . JANUMET XR [phone] MG TB24 TAKE 1 TABLET BY MOUTH DAILY 30 tablet 4  . latanoprost (XALATAN) 0.005 % ophthalmic solution Place 1 drop into the right eye daily.     Marland Kitchen LORazepam (ATIVAN) 0.5 MG tablet Take 0.5 mg by mouth every 8 (eight) hours. As needed    . VIAGRA 100 MG tablet TAKE ONE TABLET BY MOUTH AS NEEDED FOR ERECTILE DYSFUNCTION. 10 tablet 3   No current facility-administered medications for this visit.    Allergies:   Sulfa antibiotics   Social History:  The patient  reports that he quit smoking about 35 years ago. His smoking use included cigarettes. He has a 20.00 pack-year smoking history. He has never used smokeless tobacco. He reports current alcohol use. He reports that he does not use drugs.   Family History:  The patient's family history includes Heart disease in his father; Leukemia in his father; Ovarian cancer in his mother.   ROS:  Please see the history of present illness.   All other systems are personally reviewed and negative.    Exam:    Vital Signs:  BP 115/72   Ht 6\' 4"  (1.93 m)   Wt 188 lb (85.3 kg)   BMI 22.88 kg/m   Well sounding, alert and conversant   Labs/Other Tests and Data Reviewed:    Recent Labs: 02/27/2019: ALT 11 06/06/2019: Hemoglobin 14.8; Platelets 147.0; TSH 0.49 11/30/2019: BUN 15; Creatinine, Ser 1.19; Potassium 4.7; Sodium 139   Wt Readings from Last 3 Encounters:  01/07/20 188 lb (85.3 kg)  10/17/19 184 lb (83.5 kg)  06/15/19 185 lb (83.9 kg)    Last device remote is reviewed from [facility] PDF which reveals normal device function, no arrhythmias   ASSESSMENT &  PLAN:    1.  Nonischemic CM/ chronic systolic dysfunction/ CHB Remotes are uptodate Normal BiV ICD function He is device dependant Followed in ICM device clinic by Sharman Cheek By last remote, he is about 8 months to ERI Risks, benefits, and alternatives to BiV ICD pulse generator replacement were discussed in detail today.  The patient understands that risks include but are not limited to bleeding, infection, pneumothorax, perforation, tamponade, vascular damage, renal failure, MI, stroke, death, inappropriate shocks, damage to his existing leads, and lead dislodgement and wishes to proceed once he reaches ERI. He will continue remote monitoring in the interim.  2. HTN Stable No change required today  Follow-up:  with me  in a year unless he reaches ERI in the interim.   Patient Risk:  after full review of this patients clinical status, I feel that they are at moderate risk at this time.  Today, I have spent 15 minutes with the patient with telehealth technology discussing arrhythmia management .    Army Fossa, MD  01/07/2020 3:03 PM     [place] [address] [phone] (office) [phone] (fax)

## 2020-01-15 ENCOUNTER — Telehealth: Payer: Self-pay

## 2020-01-15 NOTE — Telephone Encounter (Signed)
The pt states someone called from the office yesterday but I do not see a phone note about the call. He asked about his battery. I let him battery life was 7 months.

## 2020-01-22 ENCOUNTER — Encounter: Payer: Self-pay | Admitting: Neurology

## 2020-01-22 NOTE — Progress Notes (Addendum)
[name] Key: [id number] - PA Case ID: [id number] - Rx #[id number] Need help? Call us at [phone] Archivedtoday Outcome Approvedtoday Your request has been approved Drug buPROPion HCl ER (XL) 150MG  er tablets Form [employer] Electronic PA Form Original Claim Info [id number] MD CALL [phone] 4645MAXIMUM DAILY DOSE OF 1.0000

## 2020-01-28 ENCOUNTER — Ambulatory Visit (INDEPENDENT_AMBULATORY_CARE_PROVIDER_SITE_OTHER): Payer: Medicare HMO

## 2020-01-28 ENCOUNTER — Other Ambulatory Visit: Payer: Self-pay

## 2020-01-28 DIAGNOSIS — I5042 Chronic combined systolic (congestive) and diastolic (congestive) heart failure: Secondary | ICD-10-CM

## 2020-01-28 DIAGNOSIS — Z95810 Presence of automatic (implantable) cardiac defibrillator: Secondary | ICD-10-CM | POA: Diagnosis not present

## 2020-01-28 MED ORDER — JANUMET XR 100-1000 MG PO TB24: 1.0000 | ORAL_TABLET | Freq: Every day | ORAL | 0 refills | Status: DC

## 2020-01-29 ENCOUNTER — Telehealth: Payer: Self-pay

## 2020-01-29 NOTE — Telephone Encounter (Signed)
Remote ICM transmission received.  Attempted call to patient regarding ICM remote transmission and left detailed message per DPR.  Advised to return call for any fluid symptoms or questions.  

## 2020-01-29 NOTE — Progress Notes (Signed)
EPIC Encounter for ICM Monitoring  Patient Name: Kevin [name] is a 74 y.o. male Date: 01/29/2020 Primary Care Physican: [name], MD Primary Cardiologist:[name] Electrophysiologist: [name] Bi-V Pacing: 99.9% LastWeight:185lbs   Attempted call to patient and unable to reach.  Left detailed message per DPR regarding transmission. Transmission reviewed.   OptivolThoracic impedancesuggesting possible fluid accumulation since 01/11/2020.  Labs: 11/30/2019 Creatinine 1.19, BUN 27, Potassium 4.7, Sodium 139, GFR 59.76 07/20/2019 Creatinine 1.22, BUN 14, Potassium 4.5, Sodium 140, GFR 58.12 02/27/2019 Creatinine 1.12, BUN 15, Potassium 4.6, Sodium 140, GFR 64.22  Patient does not take a diuretic.  Recommendations:Left voice mail with ICM number and encouraged to call if experiencing any fluid symptoms.  Follow-up plan: ICM clinic phone appointment on3/29/2021 to recheck fluid levels. 91 day device clinic remote transmission4/13/2021.   Copy of ICM check sent to Dr.[name] and Dr Sallyanne Kuster  3 month ICM trend: 01/28/2020    1 Year ICM trend:       Rosalene Billings, RN 01/29/2020 1:45 PM

## 2020-02-04 ENCOUNTER — Telehealth: Payer: Self-pay

## 2020-02-04 ENCOUNTER — Ambulatory Visit (INDEPENDENT_AMBULATORY_CARE_PROVIDER_SITE_OTHER): Payer: Medicare HMO

## 2020-02-04 DIAGNOSIS — I5042 Chronic combined systolic (congestive) and diastolic (congestive) heart failure: Secondary | ICD-10-CM

## 2020-02-04 DIAGNOSIS — Z95810 Presence of automatic (implantable) cardiac defibrillator: Secondary | ICD-10-CM

## 2020-02-04 NOTE — Telephone Encounter (Signed)
Remote ICM transmission received.  Attempted call to patient regarding ICM remote transmission and left detailed message per DPR to return call.  Advised to return call for any fluid symptoms or questions.      

## 2020-02-04 NOTE — Progress Notes (Signed)
EPIC Encounter for ICM Monitoring  Patient Name: Kevin [name] is a 74 y.o. male Date: 02/04/2020 Primary Care Physican: [name], MD Primary Cardiologist:[name] Electrophysiologist: [name] Bi-V Pacing: 99.9% LastWeight:185lbs  Battery ERI 6 months   Attempted call to patient and unable to reach.  Left detailed message per DPR regarding transmission. Transmission reviewed.   OptivolThoracic impedancesuggesting possible fluid accumulation since 01/11/2020.  Labs: 11/30/2019 Creatinine 1.19, BUN 27, Potassium 4.7, Sodium 139, GFR 59.76 07/20/2019 Creatinine 1.22, BUN 14, Potassium 4.5, Sodium 140, GFR 58.12 02/27/2019 Creatinine 1.12, BUN 15, Potassium 4.6, Sodium 140, GFR 64.22  Patient does not take a diuretic.  Recommendations:Left voice mail with ICM number and encouraged to call if experiencing any fluid symptoms.  Follow-up plan: ICM clinic phone appointment on4/13/2021 to recheck fluid levels. 91 day device clinic remote transmission4/13/2021.   Copy of ICM check sent to Dr.[name] and Dr Sallyanne Kuster  3 month ICM trend: 02/04/2020    1 Year ICM trend:       Rosalene Billings, RN 02/04/2020 4:47 PM

## 2020-02-05 DIAGNOSIS — R42 Dizziness and giddiness: Secondary | ICD-10-CM

## 2020-02-06 ENCOUNTER — Other Ambulatory Visit: Payer: Self-pay | Admitting: Endocrinology

## 2020-02-06 NOTE — Telephone Encounter (Signed)
Advised patient wife patient needs labs.  Appointment scheduled and labs ordered.

## 2020-02-06 NOTE — Telephone Encounter (Signed)
Patient says she has noticed this is for a few months just worsening in the last 2 weeks.  He does complain of dizziness, head hurts and nausea.  He has also fallen 5-6 times in the last several weeks.

## 2020-02-07 ENCOUNTER — Other Ambulatory Visit: Payer: Self-pay

## 2020-02-07 ENCOUNTER — Other Ambulatory Visit (INDEPENDENT_AMBULATORY_CARE_PROVIDER_SITE_OTHER): Payer: Medicare HMO

## 2020-02-07 DIAGNOSIS — R42 Dizziness and giddiness: Secondary | ICD-10-CM | POA: Diagnosis not present

## 2020-02-07 LAB — CBC
HCT: 43 % (ref 39.0–52.0)
Hemoglobin: 14.7 g/dL (ref 13.0–17.0)
MCHC: 34.1 g/dL (ref 30.0–36.0)
MCV: 99.3 fl (ref 78.0–100.0)
Platelets: 167 10*3/uL (ref 150.0–400.0)
RBC: 4.33 Mil/uL (ref 4.22–5.81)
RDW: 13.2 % (ref 11.5–15.5)
WBC: 8.3 10*3/uL (ref 4.0–10.5)

## 2020-02-07 LAB — BASIC METABOLIC PANEL
BUN: 11 mg/dL (ref 6–23)
CO2: 29 mEq/L (ref 19–32)
Calcium: 9.4 mg/dL (ref 8.4–10.5)
Chloride: 101 mEq/L (ref 96–112)
Creatinine, Ser: 1.04 mg/dL (ref 0.40–1.50)
GFR: 69.77 mL/min (ref >60.00)
Glucose, Bld: 212 mg/dL — ABNORMAL HIGH (ref 70–99)
Potassium: 4.3 mEq/L (ref 3.5–5.1)
Sodium: 137 mEq/L (ref 135–145)

## 2020-02-07 LAB — URINALYSIS
Bilirubin Urine: NEGATIVE
Hgb urine dipstick: NEGATIVE
Ketones, ur: NEGATIVE
Leukocytes,Ua: NEGATIVE
Nitrite: NEGATIVE
Specific Gravity, Urine: 1.025 (ref 1.000–1.030)
Total Protein, Urine: NEGATIVE
Urine Glucose: NEGATIVE
Urobilinogen, UA: 0.2 (ref 0.0–1.0)
pH: 6 (ref 5.0–8.0)

## 2020-02-11 ENCOUNTER — Ambulatory Visit
Admission: RE | Admit: 2020-02-11 | Discharge: 2020-02-11 | Disposition: A | Discharge status: Home / Self Care | Payer: Medicare HMO | Source: Ambulatory Visit | Attending: Neurology | Admitting: Neurology

## 2020-02-11 ENCOUNTER — Other Ambulatory Visit: Payer: Self-pay

## 2020-02-11 ENCOUNTER — Telehealth: Payer: Self-pay | Admitting: Neurology

## 2020-02-11 DIAGNOSIS — I639 Cerebral infarction, unspecified: Secondary | ICD-10-CM | POA: Diagnosis not present

## 2020-02-11 DIAGNOSIS — R410 Disorientation, unspecified: Secondary | ICD-10-CM

## 2020-02-11 DIAGNOSIS — R42 Dizziness and giddiness: Secondary | ICD-10-CM

## 2020-02-11 DIAGNOSIS — R296 Repeated falls: Secondary | ICD-10-CM

## 2020-02-11 MED ORDER — CLOPIDOGREL BISULFATE 75 MG PO TABS: 75.0000 mg | ORAL_TABLET | Freq: Every day | ORAL | 11 refills | Status: DC

## 2020-02-11 NOTE — Telephone Encounter (Signed)
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 Has had a change for a month or maybe even before than that. Getting worse with memory. The falling and dizziness for a couple of months, but more and more dizzy. He gets up and he says he feels very confused, which is frustrating him. Had fallen twice in the backyard this week. Just very confused and unsure in the past 4 weeks, talking to her and in a conversation ie last night watching baseball game and asked 5 times within 3mins same question which stadiums she had been to. No focal weakness, no visual field deficits that she has noticed. He has been on a daily aspirin with no missed medications. LDL in 07/2019 normal. He has had his pacemaker interrogated recently with no arrhythmia. Discussed switching to Plavix 75mg  daily, doing CTA head and neck as part of stroke work up, and doing PT. We can add on Namenda in 1-2 weeks. Wife knows to go to ER immediately for any sudden change in symptoms.   Heather, pls order CTA head and neck with and without contrast, and also PT at [facility] (they want to do PT in the office), Dx: stroke. Thanks!

## 2020-02-11 NOTE — Progress Notes (Signed)
Spoke to pt wife informed we will do a head CT without contrast for confusion, dizziness, frequent falls. Verbalized understanding

## 2020-02-12 ENCOUNTER — Other Ambulatory Visit: Payer: Self-pay

## 2020-02-12 DIAGNOSIS — I639 Cerebral infarction, unspecified: Secondary | ICD-10-CM

## 2020-02-12 MED ORDER — BUPROPION HCL ER (XL) 150 MG PO TB24: ORAL_TABLET | ORAL | 3 refills | Status: DC

## 2020-02-12 NOTE — Telephone Encounter (Signed)
CTA head and neck with and without contrast, and also PT at [facility] orders placed in [place] will call to get pt scheduled for CTA and [facility] will call for PT.

## 2020-02-14 ENCOUNTER — Telehealth: Payer: Self-pay

## 2020-02-14 NOTE — Telephone Encounter (Signed)
Hi Dr. Elease Hashimoto,   I received a referral from [place] [facility] for this patient regarding adherence/ medication cost.   Do you approve for this patient to be enrolled into CCM?

## 2020-02-15 MED ORDER — BUSPIRONE HCL 5 MG PO TABS: ORAL_TABLET | ORAL | 5 refills | Status: DC

## 2020-02-15 NOTE — Telephone Encounter (Signed)
yes

## 2020-02-18 ENCOUNTER — Telehealth: Payer: Self-pay | Admitting: Family Medicine

## 2020-02-18 NOTE — Chronic Care Management (AMB) (Signed)
  Chronic Care Management   Note  02/18/2020 Name: Kevin [name] MRN: [id number] DOB: [birth year]  Kevin [name] is a 74 y.o. year old male who is a primary care patient of Burchette, Alinda Sierras, MD. I reached out to Ferdinand Cava by phone today in response to a referral sent by Kevin [name]'s PCP, [name], MD.   Kevin [name] was given information about Chronic Care Management services today including:  1. CCM service includes personalized support from designated clinical staff supervised by his physician, including individualized plan of care and coordination with other care providers 2. 24/7 contact phone numbers for assistance for urgent and routine care needs. 3. Service will only be billed when office clinical staff spend 20 minutes or more in a month to coordinate care. 4. Only one practitioner may furnish and bill the service in a calendar month. 5. The patient may stop CCM services at any time (effective at the end of the month) by phone call to the office staff.   Patient agreed to services and verbal consent obtained.   Follow up plan:   Kevin [name] [employer] Scheduler

## 2020-02-19 ENCOUNTER — Ambulatory Visit (INDEPENDENT_AMBULATORY_CARE_PROVIDER_SITE_OTHER): Payer: Medicare HMO

## 2020-02-19 ENCOUNTER — Other Ambulatory Visit: Payer: Self-pay | Admitting: Neurology

## 2020-02-19 ENCOUNTER — Ambulatory Visit (INDEPENDENT_AMBULATORY_CARE_PROVIDER_SITE_OTHER): Payer: Medicare HMO | Admitting: *Deleted

## 2020-02-19 ENCOUNTER — Other Ambulatory Visit: Payer: Self-pay

## 2020-02-19 DIAGNOSIS — I495 Sick sinus syndrome: Secondary | ICD-10-CM

## 2020-02-19 DIAGNOSIS — I5042 Chronic combined systolic (congestive) and diastolic (congestive) heart failure: Secondary | ICD-10-CM

## 2020-02-19 DIAGNOSIS — I10 Essential (primary) hypertension: Secondary | ICD-10-CM

## 2020-02-19 DIAGNOSIS — Z95810 Presence of automatic (implantable) cardiac defibrillator: Secondary | ICD-10-CM

## 2020-02-19 DIAGNOSIS — E119 Type 2 diabetes mellitus without complications: Secondary | ICD-10-CM

## 2020-02-19 LAB — CUP PACEART REMOTE DEVICE CHECK
Battery Remaining Longevity: 6 mo
Battery Voltage: 2.81 V
Brady Statistic AP VP Percent: 0.17 %
Brady Statistic AP VS Percent: 0.01 %
Brady Statistic AS VP Percent: 99.76 %
Brady Statistic AS VS Percent: 0.06 %
Brady Statistic RA Percent Paced: 0.18 %
Brady Statistic RV Percent Paced: 99.84 %
Date Time Interrogation Session: 20210413043625
HighPow Impedance: 68 Ohm
Implantable Lead Implant Date: 20130123
Implantable Lead Implant Date: 20160316
Implantable Lead Implant Date: 20160316
Implantable Lead Location: 753858
Implantable Lead Location: 753859
Implantable Lead Location: 753860
Implantable Lead Model: 4598
Implantable Lead Model: 5076
Implantable Lead Model: 6935
Implantable Pulse Generator Implant Date: 20160316
Lead Channel Impedance Value: 1045 Ohm
Lead Channel Impedance Value: 1083 Ohm
Lead Channel Impedance Value: 1121 Ohm
Lead Channel Impedance Value: 399 Ohm
Lead Channel Impedance Value: 418 Ohm
Lead Channel Impedance Value: 437 Ohm
Lead Channel Impedance Value: 475 Ohm
Lead Channel Impedance Value: 551 Ohm
Lead Channel Impedance Value: 570 Ohm
Lead Channel Impedance Value: 627 Ohm
Lead Channel Impedance Value: 684 Ohm
Lead Channel Impedance Value: 874 Ohm
Lead Channel Impedance Value: 874 Ohm
Lead Channel Pacing Threshold Amplitude: 0.5 V
Lead Channel Pacing Threshold Amplitude: 0.5 V
Lead Channel Pacing Threshold Amplitude: 2.75 V
Lead Channel Pacing Threshold Pulse Width: 0.4 ms
Lead Channel Pacing Threshold Pulse Width: 0.4 ms
Lead Channel Pacing Threshold Pulse Width: 1 ms
Lead Channel Sensing Intrinsic Amplitude: 3 mV
Lead Channel Sensing Intrinsic Amplitude: 3 mV
Lead Channel Sensing Intrinsic Amplitude: 8.375 mV
Lead Channel Sensing Intrinsic Amplitude: 8.375 mV
Lead Channel Setting Pacing Amplitude: 2 V
Lead Channel Setting Pacing Amplitude: 2.5 V
Lead Channel Setting Pacing Amplitude: 3.25 V
Lead Channel Setting Pacing Pulse Width: 0.4 ms
Lead Channel Setting Pacing Pulse Width: 1 ms
Lead Channel Setting Sensing Sensitivity: 0.3 mV

## 2020-02-19 MED ORDER — MEMANTINE HCL 10 MG PO TABS: ORAL_TABLET | ORAL | 11 refills | Status: DC

## 2020-02-19 NOTE — Telephone Encounter (Signed)
Referral has been placed. 

## 2020-02-20 NOTE — Telephone Encounter (Signed)
Contacted patient in advance for CCM visit. Patient denies financial concerns for medications.   CCM visit scheduled Mar 13, 2020.

## 2020-02-20 NOTE — Progress Notes (Signed)
PPM Remote  

## 2020-02-20 NOTE — Progress Notes (Signed)
EPIC Encounter for ICM Monitoring  Patient Name: Kevin [name] is a 74 y.o. male Date: 02/20/2020 Primary Care Physican: [name], MD Primary Cardiologist:[name] Electrophysiologist: [name] Bi-V Pacing: 99.8% LastWeight:185lbs  Battery ERI 5 months   Transmission reviewed.  OptivolThoracic impedancereturned to normal since 3/29 remote transmission.  Labs: 11/30/2019 Creatinine 1.19, BUN 27, Potassium 4.7, Sodium 139, GFR 59.76 07/20/2019 Creatinine 1.22, BUN 14, Potassium 4.5, Sodium 140, GFR 58.12 02/27/2019 Creatinine 1.12, BUN 15, Potassium 4.6, Sodium 140, GFR 64.22  Patient does not take a diuretic.  Recommendations:  None.  Follow-up plan: ICM clinic phone appointment on5/5/2021to recheck fluid levels. 91 day device clinic remote transmission 05/20/2020.   Copy of ICM check sent to Dr.[name].  3 month ICM trend: 02/19/2020    1 Year ICM trend:       Rosalene Billings, RN 02/20/2020 11:28 AM

## 2020-02-29 ENCOUNTER — Other Ambulatory Visit: Payer: Self-pay

## 2020-02-29 ENCOUNTER — Ambulatory Visit
Admission: RE | Admit: 2020-02-29 | Discharge: 2020-02-29 | Disposition: A | Discharge status: Home / Self Care | Payer: Medicare HMO | Source: Ambulatory Visit | Attending: Neurology | Admitting: Neurology

## 2020-02-29 DIAGNOSIS — I63233 Cerebral infarction due to unspecified occlusion or stenosis of bilateral carotid arteries: Secondary | ICD-10-CM | POA: Diagnosis not present

## 2020-02-29 DIAGNOSIS — I639 Cerebral infarction, unspecified: Secondary | ICD-10-CM

## 2020-02-29 MED ORDER — IOPAMIDOL (ISOVUE-370) INJECTION 76%
75.0000 mL | Freq: Once | INTRAVENOUS | Status: AC | PRN
  Administered 2020-02-29: 75 mL via INTRAVENOUS

## 2020-03-03 ENCOUNTER — Telehealth: Payer: Self-pay | Admitting: Neurology

## 2020-03-03 NOTE — Telephone Encounter (Signed)
Did you see this report?

## 2020-03-03 NOTE — Telephone Encounter (Signed)
Left message with the after hour service 02-29-20 @ 8:40 pm    Caller states that she is calling from [facility] @ [phone].  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

## 2020-03-05 ENCOUNTER — Telehealth: Payer: Self-pay | Admitting: Neurology

## 2020-03-05 NOTE — Telephone Encounter (Signed)
Spoke to pt wife and gave her the number to [facility] so she can call to get appointment set up,

## 2020-03-05 NOTE — Telephone Encounter (Signed)
Spoke to wife, see other phone note

## 2020-03-05 NOTE — Telephone Encounter (Signed)
Spoke to wife. She was reporting that he is so dizzy, clarified dizziness, it is not a lightheaded or spinning feeling, he just does not have the balance and has had falls. Discussed CTA results showing intracranial stenosis, discussed maximum medical management with DAPT aspirin+Plavix for 3 months, then Plavix after. Continue statin, LDL was excellent in 07/2019. Also discussed avoiding hypotension to avoid hypoperfusion. Discussed that with his balance, main management would be PT. She has not heard anything from them yet.  Nira Conn, can you pls f/u on the PT order for him, wife asks that she is contacted because patient may not remember if they called him. Thanks

## 2020-03-12 ENCOUNTER — Ambulatory Visit (INDEPENDENT_AMBULATORY_CARE_PROVIDER_SITE_OTHER): Payer: Medicare HMO

## 2020-03-12 ENCOUNTER — Ambulatory Visit (INDEPENDENT_AMBULATORY_CARE_PROVIDER_SITE_OTHER): Payer: Medicare HMO | Admitting: *Deleted

## 2020-03-12 DIAGNOSIS — I5042 Chronic combined systolic (congestive) and diastolic (congestive) heart failure: Secondary | ICD-10-CM | POA: Diagnosis not present

## 2020-03-12 DIAGNOSIS — I495 Sick sinus syndrome: Secondary | ICD-10-CM

## 2020-03-12 DIAGNOSIS — Z95810 Presence of automatic (implantable) cardiac defibrillator: Secondary | ICD-10-CM

## 2020-03-12 LAB — CUP PACEART REMOTE DEVICE CHECK
Battery Remaining Longevity: 4 mo
Battery Voltage: 2.79 V
Brady Statistic AP VP Percent: 0.04 %
Brady Statistic AP VS Percent: 0.01 %
Brady Statistic AS VP Percent: 99.87 %
Brady Statistic AS VS Percent: 0.08 %
Brady Statistic RA Percent Paced: 0.05 %
Brady Statistic RV Percent Paced: 99.85 %
Date Time Interrogation Session: 20210505001804
HighPow Impedance: 75 Ohm
Implantable Lead Implant Date: 20130123
Implantable Lead Implant Date: 20160316
Implantable Lead Implant Date: 20160316
Implantable Lead Location: 753858
Implantable Lead Location: 753859
Implantable Lead Location: 753860
Implantable Lead Model: 4598
Implantable Lead Model: 5076
Implantable Lead Model: 6935
Implantable Pulse Generator Implant Date: 20160316
Lead Channel Impedance Value: 1007 Ohm
Lead Channel Impedance Value: 1121 Ohm
Lead Channel Impedance Value: 1121 Ohm
Lead Channel Impedance Value: 399 Ohm
Lead Channel Impedance Value: 418 Ohm
Lead Channel Impedance Value: 437 Ohm
Lead Channel Impedance Value: 494 Ohm
Lead Channel Impedance Value: 551 Ohm
Lead Channel Impedance Value: 551 Ohm
Lead Channel Impedance Value: 589 Ohm
Lead Channel Impedance Value: 722 Ohm
Lead Channel Impedance Value: 836 Ohm
Lead Channel Impedance Value: 855 Ohm
Lead Channel Pacing Threshold Amplitude: 0.5 V
Lead Channel Pacing Threshold Amplitude: 0.5 V
Lead Channel Pacing Threshold Amplitude: 2.75 V
Lead Channel Pacing Threshold Pulse Width: 0.4 ms
Lead Channel Pacing Threshold Pulse Width: 0.4 ms
Lead Channel Pacing Threshold Pulse Width: 1 ms
Lead Channel Sensing Intrinsic Amplitude: 3.625 mV
Lead Channel Sensing Intrinsic Amplitude: 3.625 mV
Lead Channel Sensing Intrinsic Amplitude: 8.375 mV
Lead Channel Sensing Intrinsic Amplitude: 8.375 mV
Lead Channel Setting Pacing Amplitude: 2 V
Lead Channel Setting Pacing Amplitude: 2.5 V
Lead Channel Setting Pacing Amplitude: 3.25 V
Lead Channel Setting Pacing Pulse Width: 0.4 ms
Lead Channel Setting Pacing Pulse Width: 1 ms
Lead Channel Setting Sensing Sensitivity: 0.3 mV

## 2020-03-12 NOTE — Progress Notes (Signed)
EPIC Encounter for ICM Monitoring  Patient Name: Kevin [name] is a 74 y.o. male Date: 03/12/2020 Primary Care Physican: [name], MD Primary Cardiologist:[name] Electrophysiologist: [name] Bi-V Pacing: 99.8% LastWeight:185lbs  Battery ERI 4 months   Transmission reviewed and results sent via mychart  OptivolThoracic impedancereturned to normal since 3/29 remote transmission.  Labs: 11/30/2019 Creatinine 1.19, BUN 27, Potassium 4.7, Sodium 139, GFR 59.76 07/20/2019 Creatinine 1.22, BUN 14, Potassium 4.5, Sodium 140, GFR 58.12 02/27/2019 Creatinine 1.12, BUN 15, Potassium 4.6, Sodium 140, GFR 64.22  Patient does not take a diuretic.  Recommendations:  None  Follow-up plan: ICM clinic phone appointment on6/05/2020. 91 day device clinic remote transmission 05/20/2020.   Copy of ICM check sent to Dr.[name].  3 month ICM trend: 03/12/2020    1 Year ICM trend:       Rosalene Billings, RN 03/12/2020 11:03 AM

## 2020-03-12 NOTE — Progress Notes (Signed)
Remote ICD transmission.   

## 2020-03-13 ENCOUNTER — Ambulatory Visit: Payer: Medicare HMO

## 2020-03-13 ENCOUNTER — Other Ambulatory Visit: Payer: Self-pay

## 2020-03-13 DIAGNOSIS — E7800 Pure hypercholesterolemia, unspecified: Secondary | ICD-10-CM

## 2020-03-13 DIAGNOSIS — G3184 Mild cognitive impairment, so stated: Secondary | ICD-10-CM

## 2020-03-13 DIAGNOSIS — F419 Anxiety disorder, unspecified: Secondary | ICD-10-CM

## 2020-03-13 DIAGNOSIS — E119 Type 2 diabetes mellitus without complications: Secondary | ICD-10-CM

## 2020-03-13 DIAGNOSIS — I10 Essential (primary) hypertension: Secondary | ICD-10-CM

## 2020-03-13 DIAGNOSIS — I5042 Chronic combined systolic (congestive) and diastolic (congestive) heart failure: Secondary | ICD-10-CM

## 2020-03-13 NOTE — Chronic Care Management (AMB) (Signed)
Chronic Care Management Pharmacy  Name: Kevin [name]  MRN: [id number] DOB: [birth year]  Initial Questions: 1. Have you seen any other providers since your last visit? NA 2. Any changes in your medicines or health? No   Chief Complaint/ HPI  Kevin [name],  74 y.o. , male presents for their Initial CCM visit with the clinical pharmacist via telephone due to COVID-19 Pandemic.  PCP : [name], MD  Their chronic conditions include: DM, HF, HTN, intracranial stenosis, HLD, anxiety, mild cognitive impairment  Office Visits: 06/14/2019- Carolann Littler, MD- Patient presented for virtual visit for diarrhea. Suspect COVID-19 infection.Patient to obtain test and quarantine, drinking plenty of fluids and rest.  06/06/2019- Carolann Littler, MD- Patient presented for office visit for nonspecific lightheadedness. No orthostatic drop. BP seated: 130/74 and standing: 136/78. Labs to be checked: CBC, TSH. Patient to change positions slowly.   Consult Visit: 01/07/2020- Cardiology- [name], MD- patient presented for virtual visit for follow up. 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 Patient to follow up in 1 year.   12/03/2019- Endocrinology- Elayne Snare, MD- Patient presented for virtual visit for DM. Patient reported obtaining medications from [place]. Janumet 100/1000mg  to be continued. Patient to bring in monitor for comparison. SGLT-2 to be considered with patient's cardiac history. To be discussed at next office visit. Patient to return in 4 months.   10/18/2019- Neurology- Ellouise Newer, MD- patient presented for virtual visit for follow up. Patient to have repeat head CT at [facility] for possible stroke. Patient knows to go to ER immediately for symptoms changes. Discussed increasing Wellbutrin 150mg  to 2 tablets once daily to help with mood/ increased frustration. Patient to follow up in 6 months.   Medications: Outpatient Encounter Medications as  of 03/13/2020  Medication Sig  . atorvastatin (LIPITOR) 80 MG tablet Take 40 mg by mouth daily at 6 PM.  . buPROPion (WELLBUTRIN XL) 150 MG 24 hr tablet Take 2 tablets daily  . carvedilol (COREG) 12.5 MG tablet Take 6.25 mg by mouth 2 (two) times daily with a meal.  . CHELATED MAGNESIUM PO Take 250 mg by mouth daily.  . clopidogrel (PLAVIX) 75 MG tablet Take 1 tablet (75 mg total) by mouth daily.  . Continuous Blood Gluc Receiver (FREESTYLE LIBRE 14 DAY READER) DEVI 1 each by Does not apply route See admin instructions. Use Freestyle Libre Reader to monitor blood sugars.  . Continuous Blood Gluc Sensor (FREESTYLE LIBRE 14 DAY SENSOR) MISC USE FREESTYLE LIBRE SENSOR TO MONITOR BLOOD SUGAR AS DIRECTED  . donepezil (ARICEPT) 5 MG tablet Take 1 tablet daily  . LORazepam (ATIVAN) 0.5 MG tablet Take 0.5 mg by mouth every 8 (eight) hours. As needed  . memantine (NAMENDA) 10 MG tablet Take 1 tablet every night for 2 weeks, then increase to 1 tablet twice a day (Patient taking differently: 1 tablet twice a day)  . SitaGLIPtin-MetFORMIN HCl (JANUMET XR) [phone] MG TB24 Take 1 tablet by mouth daily.  Marland Kitchen ACCU-CHEK COMPACT PLUS test strip use 1 TEST STRIP once daily as directed (Patient not taking: Reported on 03/13/2020)  . busPIRone (BUSPAR) 5 MG tablet Take 1 tablet twice a day for a week, then may increase to three times a day (Patient not taking: Reported on 03/13/2020)  . clobetasol ointment (TEMOVATE) [id number] % Apply 1 application topically daily as needed (itching).   Marland Kitchen latanoprost (XALATAN) 0.005 % ophthalmic solution Place 1 drop into the right eye daily.   Marland Kitchen  VIAGRA 100 MG tablet TAKE ONE TABLET BY MOUTH AS NEEDED FOR ERECTILE DYSFUNCTION. (Patient not taking: Reported on 03/13/2020)   No facility-administered encounter medications on file as of 03/13/2020.     Current Diagnosis/Assessment:  Goals Addressed            This Visit's Progress   . Pharmacy Care Plan       CARE PLAN ENTRY  Current  Barriers:  . Chronic Disease Management support, education, and care coordination needs related to Hypertension, Hyperlipidemia, Diabetes, Heart Failure, Anxiety, and Intracranial stenosis, mild cognitive impairment    Hypertension . Pharmacist Clinical Goal(s): o Over the next 180 days, patient will work with PharmD and providers to maintain BP goal <130/80 . Current regimen:  o Carvedilol 12.5mg , 0.5 tablet twice daily   . Patient self care activities - Over the next 180 days, patient will: o Check BP at home, document, and provide at future appointments  Hyperlipidemia . Pharmacist Clinical Goal(s): o Over the next 180 days, patient will work with PharmD and providers to maintain LDL goal < 70 . Current regimen:  o Atorvastatin 80mg , 40mg  daily at 6 pm  . Patient self care activities - Over the next 180 days, patient will: o Continue current medication.   Diabetes . Pharmacist Clinical Goal(s): o Over the next 180 days, patient will work with PharmD and providers to maintain A1c goal <7% . Current regimen:  o Janumet XR 100-1000mg , 1 tablet daily  . Patient self care activities - Over the next 180 days, patient will: o Check blood sugar using Freestyle Libre, document, and provide at future appointments  Heart failure  . Pharmacist Clinical Goal(s) o Over the next 180 days, patient will work with PharmD and providers to  . Current regimen:  o Carvedilol 12.5mg , 0.5 tablet twice daily   . Patient self care activities - Over the next 180 days, patient will: o Continue current medication and report shortness/ swelling to providers.  Intracranial stenosis  . Pharmacist Clinical Goal(s) o Over the next 180 days, patient will work with PharmD and providers to continue current medications to decrease stroke event.  . Current regimen:   Clopidogrel 75mg , 1 tablet once daily  ASA 81mg , 1 tablet daily . Patient self care activities o Patient will continue current medications for  3 months and then only continue clopidogrel.   Anxiety . Pharmacist Clinical Goal(s) o Over the next 180 days, patient will work with PharmD and providers to continue current medications to control mood.  . Current regimen:   Bupropion (Wellbutrin XL) 150mg , 2 tablets once daily   Lorazepam 0.5mg , 1 tablet every eight hours as needed  . Patient self care activities o Patient will continue current medications.   Mild cognitive impairment . Pharmacist Clinical Goal(s) o Over the next 180 days, patient will work with PharmD and providers to continue current medications to prevent worsening of memory.  . Current regimen:   Donepezil 5mg , 1 tablet once daily  memantine (Namenda) 10mg , 1 tablet twice a day . Patient self care activities o Patient will continue current medications and follow up visit with Dr. Ellouise Newer  (neurology).     Medication management . Pharmacist Clinical Goal(s): o Over the next 180 days, patient will work with PharmD and providers to maintain optimal medication adherence . Current pharmacy: [facility] and [place]  . Interventions o Comprehensive medication review performed. o Continue current medication management strategy . Patient self care activities - Over the next 180  days, patient will: o Take medications as prescribed o Report any questions or concerns to PharmD and/or provider(s)  Initial goal documentation        Diabetes  Patient reported he just put on a new monitor so unable to provide current readings.   Recent Relevant Labs: Lab Results  Component Value Date/Time   HGBA1C 6.5 11/30/2019 11:58 AM   HGBA1C 6.7 (H) 07/20/2019 08:48 AM   MICROALBUR 2.3 (H) 07/20/2019 08:48 AM   MICROALBUR 2.4 (H) 07/24/2018 09:59 AM    Checking BG: Daily  Recent FBG Readings: 110-120 average BGs.   Patient has failed these meds in past: glipizide  Patient is currently controlled on the following medications:   Janumet XR 100-1000mg , 1 tablet  daily    Plan Continue current medications   Heart Failure    Type: Combined Systolic and Diastolic  Last ejection fraction: 40% (08/07/2013)    NYHA Class: I (no actitivty limitation)  Patient has failed these meds in past: lisinopril, losartan, spironolactone   Patient is currently controlled on the following medications:  Carvedilol 12.5mg , 0.5 tablet twice daily    Plan Continue current medications   Hypertension  Denies current/ persistent headaches, dizziness, lightheadedness  Spouse reports couple of times in the past.   Office blood pressures are  BP Readings from Last 3 Encounters:  01/07/20 115/72  06/15/19 140/80  06/06/19 122/68   Patient has failed these meds in the past:   Patient checks BP at home several times per month  Patient home BP readings are ranging: 120/70   Patient is controlled on:   Carvedilol 12.5mg , 0.5 tablet twice daily    Plan Continue current medications   Intracranial stenosis   Per last encounter with Dr. Delice Lesch, patient to continue DAPT for 3 months and then Plavix after.  Denies bruising.   Patient is currently controlled on the following medications:   Clopidogrel 75mg , 1 tablet once daily  ASA 81mg , 1 tablet daily  Plan Continue current medications   Hyperlipidemia   Lipid Panel     Component Value Date/Time   CHOL 125 07/20/2019 0848   TRIG 134.0 07/20/2019 0848   HDL 34.10 (L) 07/20/2019 0848   CHOLHDL 4 07/20/2019 0848   VLDL 26.8 07/20/2019 0848   LDLCALC 64 07/20/2019 0848   LDLDIRECT 58.6 09/24/2014 0824     The ASCVD Risk score (Goff DC Jr., et al., 2013) failed to calculate for the following reasons:   The patient has a prior MI or stroke diagnosis   Patient has failed these meds in past: none Patient is currently controlled on the following medications:   Atorvastatin 80mg , 40mg  daily at 6 pm (0.5 tablet in the evening)   Plan Continue current medications   Anxiety  Patient obtains  through [place].  Patient reports did not start Buspirone 5mg , 1 tablet twice daily for a week then may increase to three times daily. 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 Patient reports doing about 4 hours in a day and would walk about 2 miles and endorsed enjoying doing this.   Patient is currently controlled on the following medications:   Bupropion (Wellbutrin XL) 150mg , 2 tablets once daily   Lorazepam 0.5mg , 1 tablet every eight hours as needed   Plan Continue current medications   Mild cognitive impairment    Patient is currently  on the following medications:   Donepezil 5mg , 1 tablet once daily  memantine (  Namenda) 10mg , 1 tablet twice a day  Plan Managed by Neurology Ellouise Newer, MD)  Continue current medications   OTC/ supplements    Chelated magnesium 250mg , 1 tablet once daily   Clobetasol ointment 0.05%, apply as needed for itching   Medication Management  Patient organizes medications: spouse organizes medications. Uses pill box (for morning and evening).  Primary [facility]: [facility] and [place] Adherence: no gaps in refill history (per medication dispense history from 09/15/2019 to 03/13/2020)  Follow up Follow up visit with PharmD in 6 months. Will conduct general telephone calls for periodic check-ins before next visit.   Anson Crofts, PharmD Clinical Pharmacist [facility] [place] [phone]

## 2020-03-26 NOTE — Patient Instructions (Addendum)
Visit Information  Goals Addressed            This Visit's Progress   . Pharmacy Care Plan       CARE PLAN ENTRY  Current Barriers:  . Chronic Disease Management support, education, and care coordination needs related to Hypertension, Hyperlipidemia, Diabetes, Heart Failure, Anxiety, and Intracranial stenosis, mild cognitive impairment    Hypertension . Pharmacist Clinical Goal(s): o Over the next 180 days, patient will work with PharmD and providers to maintain BP goal <130/80 . Current regimen:  o Carvedilol 12.5mg , 0.5 tablet twice daily   . Patient self care activities - Over the next 180 days, patient will: o Check BP at home, document, and provide at future appointments  Hyperlipidemia . Pharmacist Clinical Goal(s): o Over the next 180 days, patient will work with PharmD and providers to maintain LDL goal < 70 . Current regimen:  o Atorvastatin 80mg , 40mg  daily at 6 pm  . Patient self care activities - Over the next 180 days, patient will: o Continue current medication.   Diabetes . Pharmacist Clinical Goal(s): o Over the next 180 days, patient will work with PharmD and providers to maintain A1c goal <7% . Current regimen:  o Janumet XR 100-1000mg , 1 tablet daily  . Patient self care activities - Over the next 180 days, patient will: o Check blood sugar using Freestyle Libre, document, and provide at future appointments  Heart failure  . Pharmacist Clinical Goal(s) o Over the next 180 days, patient will work with PharmD and providers to  . Current regimen:  o Carvedilol 12.5mg , 0.5 tablet twice daily   . Patient self care activities - Over the next 180 days, patient will: o Continue current medication and report shortness/ swelling to providers.  Intracranial stenosis  . Pharmacist Clinical Goal(s) o Over the next 180 days, patient will work with PharmD and providers to continue current medications to decrease stroke event.  . Current regimen:   Clopidogrel  75mg , 1 tablet once daily  ASA 81mg , 1 tablet daily . Patient self care activities o Patient will continue current medications for 3 months and then only continue clopidogrel.   Anxiety . Pharmacist Clinical Goal(s) o Over the next 180 days, patient will work with PharmD and providers to continue current medications to control mood.  . Current regimen:   Bupropion (Wellbutrin XL) 150mg , 2 tablets once daily   Lorazepam 0.5mg , 1 tablet every eight hours as needed  . Patient self care activities o Patient will continue current medications.   Mild cognitive impairment . Pharmacist Clinical Goal(s) o Over the next 180 days, patient will work with PharmD and providers to continue current medications to prevent worsening of memory.  . Current regimen:   Donepezil 5mg , 1 tablet once daily  memantine (Namenda) 10mg , 1 tablet twice a day . Patient self care activities o Patient will continue current medications and follow up visit with Dr. Ellouise Newer  (neurology).     Medication management . Pharmacist Clinical Goal(s): o Over the next 180 days, patient will work with PharmD and providers to maintain optimal medication adherence . Current pharmacy: [facility] and [place]  . Interventions o Comprehensive medication review performed. o Continue current medication management strategy . Patient self care activities - Over the next 180 days, patient will: o Take medications as prescribed o Report any questions or concerns to PharmD and/or provider(s)  Initial goal documentation        Kevin [name] was given information about Chronic  Care Management services today including:  1. CCM service includes personalized support from designated clinical staff supervised by his physician, including individualized plan of care and coordination with other care providers 2. 24/7 contact phone numbers for assistance for urgent and routine care needs. 3. Standard insurance, coinsurance, copays and  deductibles apply for chronic care management only during months in which we provide at least 20 minutes of these services. Most insurances cover these services at 100%, however patients may be responsible for any copay, coinsurance and/or deductible if applicable. This service may help you avoid the need for more expensive face-to-face services. 4. Only one practitioner may furnish and bill the service in a calendar month. 5. The patient may stop CCM services at any time (effective at the end of the month) by phone call to the office staff.  Patient agreed to services and verbal consent obtained.   The patient verbalized understanding of instructions provided today and agreed to receive a mailed copy of patient instruction and/or educational materials. Telephone follow up appointment with pharmacy team member scheduled for: 09/12/2020  Anson Crofts, PharmD Clinical Pharmacist [facility] [place] [phone]    [place] stands for "Dietary Approaches to Stop Hypertension." The DASH eating plan is a healthy eating plan that has been shown to reduce high blood pressure (hypertension). It may also reduce your risk for type 2 diabetes, heart disease, and stroke. The DASH eating plan may also help with weight loss. What are tips for following this plan?  General guidelines  Avoid eating more than 2,300 mg (milligrams) of salt (sodium) a day. If you have hypertension, you may need to reduce your sodium intake to 1,500 mg a day.  Limit alcohol intake to no more than 1 drink a day for nonpregnant women and 2 drinks a day for men. One drink equals 12 oz of beer, 5 oz of wine, or 1 oz of hard liquor.  Work with your health care provider to maintain a healthy body weight or to lose weight. Ask what an ideal weight is for you.  Get at least 30 minutes of exercise that causes your heart to beat faster (aerobic exercise) most days of the week. Activities may  include walking, swimming, or biking.  Work with your health care provider or diet and nutrition specialist (dietitian) to adjust your eating plan to your individual calorie needs. Reading food labels   Check food labels for the amount of sodium per serving. Choose foods with less than 5 percent of the Daily Value of sodium. Generally, foods with less than 300 mg of sodium per serving fit into this eating plan.  To find whole grains, look for the word "whole" as the first word in the ingredient list. Shopping  Buy products labeled as "low-sodium" or "no salt added."  Buy fresh foods. Avoid canned foods and premade or frozen meals. Cooking  Avoid adding salt when cooking. Use salt-free seasonings or herbs instead of table salt or sea salt. Check with your health care provider or pharmacist before using salt substitutes.  Do not fry foods. Cook foods using healthy methods such as baking, boiling, grilling, and broiling instead.  Cook with heart-healthy oils, such as olive, canola, soybean, or sunflower oil. Meal planning  Eat a balanced diet that includes: ? 5 or more servings of fruits and vegetables each day. At each meal, try to fill half of your plate with fruits and vegetables. ? Up to 6-8 servings of  whole grains each day. ? Less than 6 oz of lean meat, poultry, or fish each day. A 3-oz serving of meat is about the same size as a deck of cards. One egg equals 1 oz. ? 2 servings of low-fat dairy each day. ? A serving of nuts, seeds, or beans 5 times each week. ? Heart-healthy fats. Healthy fats called Omega-3 fatty acids are found in foods such as flaxseeds and coldwater fish, like sardines, salmon, and mackerel.  Limit how much you eat of the following: ? Canned or prepackaged foods. ? Food that is high in trans fat, such as fried foods. ? Food that is high in saturated fat, such as fatty meat. ? Sweets, desserts, sugary drinks, and other foods with added sugar. ? Full-fat  dairy products.  Do not salt foods before eating.  Try to eat at least 2 vegetarian meals each week.  Eat more home-cooked food and less restaurant, buffet, and fast food.  When eating at a restaurant, ask that your food be prepared with less salt or no salt, if possible. What foods are recommended? The items listed may not be a complete list. Talk with your dietitian about what dietary choices are best for you. Grains Whole-grain or whole-wheat bread. Whole-grain or whole-wheat pasta. Brown rice. [place]. Bulgur. Whole-grain and low-sodium cereals. Pita bread. Low-fat, low-sodium crackers. Whole-wheat flour tortillas. Vegetables Fresh or frozen vegetables (raw, steamed, roasted, or grilled). Low-sodium or reduced-sodium tomato and vegetable juice. Low-sodium or reduced-sodium tomato sauce and tomato paste. Low-sodium or reduced-sodium canned vegetables. Fruits All fresh, dried, or frozen fruit. Canned fruit in natural juice (without added sugar). Meat and other protein foods Skinless chicken or [place]. Ground chicken or [place]. Pork with fat trimmed off. Fish and seafood. Egg whites. Dried beans, peas, or lentils. Unsalted nuts, nut butters, and seeds. Unsalted canned beans. Lean cuts of beef with fat trimmed off. Low-sodium, lean deli meat. Dairy Low-fat (1%) or fat-free (skim) milk. Fat-free, low-fat, or reduced-fat cheeses. Nonfat, low-sodium ricotta or cottage cheese. Low-fat or nonfat yogurt. Low-fat, low-sodium cheese. Fats and oils Soft margarine without trans fats. Vegetable oil. Low-fat, reduced-fat, or light mayonnaise and salad dressings (reduced-sodium). Canola, safflower, olive, soybean, and sunflower oils. Avocado. Seasoning and other foods Herbs. Spices. Seasoning mixes without salt. Unsalted popcorn and pretzels. Fat-free sweets. What foods are not recommended? The items listed may not be a complete list. Talk with your dietitian about what dietary choices are best  for you. Grains Baked goods made with fat, such as croissants, muffins, or some breads. Dry pasta or rice meal packs. Vegetables Creamed or fried vegetables. Vegetables in a cheese sauce. Regular canned vegetables (not low-sodium or reduced-sodium). Regular canned tomato sauce and paste (not low-sodium or reduced-sodium). Regular tomato and vegetable juice (not low-sodium or reduced-sodium). Angie Fava. Olives. Fruits Canned fruit in a light or heavy syrup. Fried fruit. Fruit in cream or butter sauce. Meat and other protein foods Fatty cuts of meat. Ribs. Fried meat. Berniece Salines. Sausage. Bologna and other processed lunch meats. Salami. Fatback. Hotdogs. Bratwurst. Salted nuts and seeds. Canned beans with added salt. Canned or smoked fish. Whole eggs or egg yolks. Chicken or [place] with skin. Dairy Whole or 2% milk, cream, and half-and-half. Whole or full-fat cream cheese. Whole-fat or sweetened yogurt. Full-fat cheese. Nondairy creamers. Whipped toppings. Processed cheese and cheese spreads. Fats and oils Butter. Stick margarine. Lard. Shortening. Ghee. Bacon fat. Tropical oils, such as coconut, palm kernel, or palm oil. Seasoning and other foods Salted  popcorn and pretzels. Onion salt, garlic salt, seasoned salt, table salt, and sea salt. Worcestershire sauce. Tartar sauce. Barbecue sauce. Teriyaki sauce. Soy sauce, including reduced-sodium. Steak sauce. Canned and packaged gravies. Fish sauce. Oyster sauce. Cocktail sauce. Horseradish that you find on the shelf. Ketchup. Mustard. Meat flavorings and tenderizers. Bouillon cubes. Hot sauce and Tabasco sauce. Premade or packaged marinades. Premade or packaged taco seasonings. Relishes. Regular salad dressings. Where to find more information:  National Heart, Lung, and [place]: https://wilson-eaton.com/  American Heart Association: www.heart.org Summary  The DASH eating plan is a healthy eating plan that has been shown to reduce high blood pressure  (hypertension). It may also reduce your risk for type 2 diabetes, heart disease, and stroke.  With the DASH eating plan, you should limit salt (sodium) intake to 2,300 mg a day. If you have hypertension, you may need to reduce your sodium intake to 1,500 mg a day.  When on the DASH eating plan, aim to eat more fresh fruits and vegetables, whole grains, lean proteins, low-fat dairy, and heart-healthy fats.  Work with your health care provider or diet and nutrition specialist (dietitian) to adjust your eating plan to your individual calorie needs. This information is not intended to replace advice given to you by your health care provider. Make sure you discuss any questions you have with your health care provider. Document Revised: 10/07/2017 Document Reviewed: 10/18/2016 Elsevier Patient Education  2020 [employer].

## 2020-03-31 ENCOUNTER — Other Ambulatory Visit: Payer: Medicare HMO

## 2020-04-01 ENCOUNTER — Other Ambulatory Visit: Payer: Self-pay

## 2020-04-03 ENCOUNTER — Ambulatory Visit: Payer: Medicare HMO | Admitting: Endocrinology

## 2020-04-03 DIAGNOSIS — Z0289 Encounter for other administrative examinations: Secondary | ICD-10-CM

## 2020-04-14 ENCOUNTER — Ambulatory Visit (INDEPENDENT_AMBULATORY_CARE_PROVIDER_SITE_OTHER): Payer: Medicare HMO | Admitting: *Deleted

## 2020-04-14 ENCOUNTER — Ambulatory Visit (INDEPENDENT_AMBULATORY_CARE_PROVIDER_SITE_OTHER): Payer: Medicare HMO

## 2020-04-14 DIAGNOSIS — Z95810 Presence of automatic (implantable) cardiac defibrillator: Secondary | ICD-10-CM | POA: Diagnosis not present

## 2020-04-14 DIAGNOSIS — I5042 Chronic combined systolic (congestive) and diastolic (congestive) heart failure: Secondary | ICD-10-CM

## 2020-04-14 DIAGNOSIS — I441 Atrioventricular block, second degree: Secondary | ICD-10-CM

## 2020-04-14 LAB — CUP PACEART REMOTE DEVICE CHECK
Battery Remaining Longevity: 4 mo
Battery Voltage: 2.79 V
Brady Statistic AP VP Percent: 0.13 %
Brady Statistic AP VS Percent: 0.01 %
Brady Statistic AS VP Percent: 99.7 %
Brady Statistic AS VS Percent: 0.17 %
Brady Statistic RA Percent Paced: 0.14 %
Brady Statistic RV Percent Paced: 99.43 %
Date Time Interrogation Session: 20210607001802
HighPow Impedance: 77 Ohm
Implantable Lead Implant Date: 20130123
Implantable Lead Implant Date: 20160316
Implantable Lead Implant Date: 20160316
Implantable Lead Location: 753858
Implantable Lead Location: 753859
Implantable Lead Location: 753860
Implantable Lead Model: 4598
Implantable Lead Model: 5076
Implantable Lead Model: 6935
Implantable Pulse Generator Implant Date: 20160316
Lead Channel Impedance Value: 1121 Ohm
Lead Channel Impedance Value: 1121 Ohm
Lead Channel Impedance Value: 1140 Ohm
Lead Channel Impedance Value: 361 Ohm
Lead Channel Impedance Value: 437 Ohm
Lead Channel Impedance Value: 494 Ohm
Lead Channel Impedance Value: 513 Ohm
Lead Channel Impedance Value: 570 Ohm
Lead Channel Impedance Value: 570 Ohm
Lead Channel Impedance Value: 684 Ohm
Lead Channel Impedance Value: 703 Ohm
Lead Channel Impedance Value: 931 Ohm
Lead Channel Impedance Value: 969 Ohm
Lead Channel Pacing Threshold Amplitude: 0.5 V
Lead Channel Pacing Threshold Amplitude: 0.5 V
Lead Channel Pacing Threshold Amplitude: 3 V
Lead Channel Pacing Threshold Pulse Width: 0.4 ms
Lead Channel Pacing Threshold Pulse Width: 0.4 ms
Lead Channel Pacing Threshold Pulse Width: 1 ms
Lead Channel Sensing Intrinsic Amplitude: 3.75 mV
Lead Channel Sensing Intrinsic Amplitude: 3.75 mV
Lead Channel Sensing Intrinsic Amplitude: 8.375 mV
Lead Channel Sensing Intrinsic Amplitude: 8.375 mV
Lead Channel Setting Pacing Amplitude: 2 V
Lead Channel Setting Pacing Amplitude: 2.5 V
Lead Channel Setting Pacing Amplitude: 3.5 V
Lead Channel Setting Pacing Pulse Width: 0.4 ms
Lead Channel Setting Pacing Pulse Width: 1 ms
Lead Channel Setting Sensing Sensitivity: 0.3 mV

## 2020-04-15 NOTE — Progress Notes (Signed)
EPIC Encounter for ICM Monitoring  Patient Name: AADIT HAGOOD is a 74 y.o. male Date: 04/15/2020 Primary Care Physican: [name], MD Primary Cardiologist:[name] Electrophysiologist: [name] Bi-V Pacing: 99.4% LastWeight:185lbs  Battery ERI3 months   Transmission reviewed.  OptivolThoracic impedancesuggesting possible fluid accumulation since 03/24/2020 but returned close to baseline normal.  Labs: 11/30/2019 Creatinine 1.19, BUN 27, Potassium 4.7, Sodium 139, GFR 59.76 07/20/2019 Creatinine 1.22, BUN 14, Potassium 4.5, Sodium 140, GFR 58.12 02/27/2019 Creatinine 1.12, BUN 15, Potassium 4.6, Sodium 140, GFR 64.22  Patient does not take a diuretic.  Recommendations:None  Follow-up plan: ICM clinic phone appointment on7/14/2021. 91 day device clinic remote transmission7/13/2021.   Copy of ICM check sent to Dr.[name].  3 month ICM trend: 04/14/2020    1 Year ICM trend:       Rosalene Billings, RN 04/15/2020 10:11 AM

## 2020-04-16 NOTE — Progress Notes (Signed)
Remote ICD transmission.   

## 2020-05-01 ENCOUNTER — Other Ambulatory Visit: Payer: Self-pay | Admitting: Endocrinology

## 2020-05-20 ENCOUNTER — Ambulatory Visit (INDEPENDENT_AMBULATORY_CARE_PROVIDER_SITE_OTHER): Payer: Medicare HMO | Admitting: *Deleted

## 2020-05-20 DIAGNOSIS — I441 Atrioventricular block, second degree: Secondary | ICD-10-CM | POA: Diagnosis not present

## 2020-05-20 LAB — CUP PACEART REMOTE DEVICE CHECK
Battery Remaining Longevity: 3 mo
Battery Voltage: 2.77 V
Brady Statistic AP VP Percent: 0.06 %
Brady Statistic AP VS Percent: 0.01 %
Brady Statistic AS VP Percent: 99.9 %
Brady Statistic AS VS Percent: 0.03 %
Brady Statistic RA Percent Paced: 0.07 %
Brady Statistic RV Percent Paced: 99.86 %
Date Time Interrogation Session: 20210713043724
HighPow Impedance: 85 Ohm
Implantable Lead Implant Date: 20130123
Implantable Lead Implant Date: 20160316
Implantable Lead Implant Date: 20160316
Implantable Lead Location: 753858
Implantable Lead Location: 753859
Implantable Lead Location: 753860
Implantable Lead Model: 4598
Implantable Lead Model: 5076
Implantable Lead Model: 6935
Implantable Pulse Generator Implant Date: 20160316
Lead Channel Impedance Value: 1064 Ohm
Lead Channel Impedance Value: 1083 Ohm
Lead Channel Impedance Value: 1121 Ohm
Lead Channel Impedance Value: 399 Ohm
Lead Channel Impedance Value: 418 Ohm
Lead Channel Impedance Value: 437 Ohm
Lead Channel Impedance Value: 494 Ohm
Lead Channel Impedance Value: 551 Ohm
Lead Channel Impedance Value: 570 Ohm
Lead Channel Impedance Value: 589 Ohm
Lead Channel Impedance Value: 722 Ohm
Lead Channel Impedance Value: 912 Ohm
Lead Channel Impedance Value: 912 Ohm
Lead Channel Pacing Threshold Amplitude: 0.5 V
Lead Channel Pacing Threshold Amplitude: 0.5 V
Lead Channel Pacing Threshold Amplitude: 2.75 V
Lead Channel Pacing Threshold Pulse Width: 0.4 ms
Lead Channel Pacing Threshold Pulse Width: 0.4 ms
Lead Channel Pacing Threshold Pulse Width: 1 ms
Lead Channel Sensing Intrinsic Amplitude: 4.75 mV
Lead Channel Sensing Intrinsic Amplitude: 4.75 mV
Lead Channel Sensing Intrinsic Amplitude: 8.375 mV
Lead Channel Sensing Intrinsic Amplitude: 8.375 mV
Lead Channel Setting Pacing Amplitude: 2 V
Lead Channel Setting Pacing Amplitude: 2.5 V
Lead Channel Setting Pacing Amplitude: 3.5 V
Lead Channel Setting Pacing Pulse Width: 0.4 ms
Lead Channel Setting Pacing Pulse Width: 1 ms
Lead Channel Setting Sensing Sensitivity: 0.3 mV

## 2020-05-21 ENCOUNTER — Ambulatory Visit (INDEPENDENT_AMBULATORY_CARE_PROVIDER_SITE_OTHER): Payer: Medicare HMO

## 2020-05-21 DIAGNOSIS — I5042 Chronic combined systolic (congestive) and diastolic (congestive) heart failure: Secondary | ICD-10-CM | POA: Diagnosis not present

## 2020-05-21 DIAGNOSIS — Z95810 Presence of automatic (implantable) cardiac defibrillator: Secondary | ICD-10-CM | POA: Diagnosis not present

## 2020-05-21 NOTE — Progress Notes (Signed)
Remote ICD transmission.   

## 2020-05-22 ENCOUNTER — Ambulatory Visit: Payer: Medicare HMO | Admitting: Neurology

## 2020-05-23 ENCOUNTER — Telehealth: Payer: Self-pay

## 2020-05-23 NOTE — Progress Notes (Signed)
EPIC Encounter for ICM Monitoring  Patient Name: Kevin [name] is a 74 y.o. male Date: 05/23/2020 Primary Care Physican: [name], MD Primary Cardiologist:[name] Electrophysiologist: [name] Bi-V Pacing: 99.9% LastWeight:185lbs  Battery ERI3 months  Attempted call to patient and unable to reach.  Left detailed message per DPR regarding transmission. Transmission reviewed.   OptivolThoracic impedancesuggesting possible fluid accumulation since 03/24/2020 but returned close to baseline normal.  Labs: 11/30/2019 Creatinine 1.19, BUN 27, Potassium 4.7, Sodium 139, GFR 59.76 07/20/2019 Creatinine 1.22, BUN 14, Potassium 4.5, Sodium 140, GFR 58.12 02/27/2019 Creatinine 1.12, BUN 15, Potassium 4.6, Sodium 140, GFR 64.22  Patient does not take a diuretic.  Recommendations:Left voice mail with ICM number and encouraged to call if experiencing any fluid symptoms.  Follow-up plan: ICM clinic phone appointment on8/16/2021. 91 day device clinic remote transmission8/07/2020.   EP/Cardiology Office Visits: 01/06/2021 with Dr. Rayann Heman.    Copy of ICM check sent to Dr. Sallyanne Kuster.   3 month ICM trend: 05/20/2020    1 Year ICM trend:       Rosalene Billings, RN 05/23/2020 12:05 PM

## 2020-05-23 NOTE — Telephone Encounter (Signed)
Remote ICM transmission received.  Attempted call to patient regarding ICM remote transmission and left detailed message per DPR regarding transmission.  Advised to return call for any fluid symptoms or questions. Next ICM remote transmission scheduled 06/23/2020.

## 2020-06-04 ENCOUNTER — Other Ambulatory Visit: Payer: Self-pay

## 2020-06-04 ENCOUNTER — Telehealth: Payer: Self-pay

## 2020-06-04 ENCOUNTER — Ambulatory Visit (INDEPENDENT_AMBULATORY_CARE_PROVIDER_SITE_OTHER): Payer: Medicare HMO

## 2020-06-04 DIAGNOSIS — Z1211 Encounter for screening for malignant neoplasm of colon: Secondary | ICD-10-CM

## 2020-06-04 DIAGNOSIS — Z86010 Personal history of colonic polyps: Secondary | ICD-10-CM

## 2020-06-04 DIAGNOSIS — Z0000 Encounter for general adult medical examination without abnormal findings: Secondary | ICD-10-CM

## 2020-06-04 NOTE — Addendum Note (Signed)
Addended by: Willette Brace on: 06/04/2020 10:07 AM   Modules accepted: Orders, SmartSet

## 2020-06-04 NOTE — Patient Instructions (Addendum)
Kevin [name] , Thank you for taking time to come for your Medicare Wellness Visit. I appreciate your ongoing commitment to your health goals. Please review the following plan we discussed and let me know if I can assist you in the future.   Screening recommendations/referrals: Colonoscopy: Done 08/20/2013 Recommended yearly ophthalmology/optometry visit for glaucoma screening and checkup Recommended yearly dental visit for hygiene and checkup  Vaccinations: Influenza vaccine: Up to date Pneumococcal vaccine: Up to date Tdap vaccine: Due with information given Shingles vaccine: Shingrix discussed. Please contact your pharmacy for coverage information.    Covid-19: Pt stated he will bring a copy  Advanced directives: Please bring a copy of your health care power of attorney and living will to the office at your convenience.  Conditions/risks identified: Continue to stay healthy  Next appointment: Follow up in one year for your annual wellness visit.   Preventive Care 65 Years and Older, Male Preventive care refers to lifestyle choices and visits with your health care provider that can promote health and wellness. What does preventive care include?  A yearly physical exam. This is also called an annual well check.  Dental exams once or twice a year.  Routine eye exams. Ask your health care provider how often you should have your eyes checked.  Personal lifestyle choices, including:  Daily care of your teeth and gums.  Regular physical activity.  Eating a healthy diet.  Avoiding tobacco and drug use.  Limiting alcohol use.  Practicing safe sex.  Taking low doses of aspirin every day.  Taking vitamin and mineral supplements as recommended by your health care provider. What happens during an annual well check? The services and screenings done by your health care provider during your annual well check will depend on your age, overall health, lifestyle risk factors, and  family history of disease. Counseling  Your health care provider may ask you questions about your:  Alcohol use.  Tobacco use.  Drug use.  Emotional well-being.  Home and relationship well-being.  Sexual activity.  Eating habits.  History of falls.  Memory and ability to understand (cognition).  Work and work Statistician. Screening  You may have the following tests or measurements:  Height, weight, and BMI.  Blood pressure.  Lipid and cholesterol levels. These may be checked every 5 years, or more frequently if you are over 3 years old.  Skin check.  Lung cancer screening. You may have this screening every year starting at age 93 if you have a 30-pack-year history of smoking and currently smoke or have quit within the past 15 years.  Fecal occult blood test (FOBT) of the stool. You may have this test every year starting at age 69.  Flexible sigmoidoscopy or colonoscopy. You may have a sigmoidoscopy every 5 years or a colonoscopy every 10 years starting at age 56.  Prostate cancer screening. Recommendations will vary depending on your family history and other risks.  Hepatitis C blood test.  Hepatitis B blood test.  Sexually transmitted disease (STD) testing.  Diabetes screening. This is done by checking your blood sugar (glucose) after you have not eaten for a while (fasting). You may have this done every 1-3 years.  Abdominal aortic aneurysm (AAA) screening. You may need this if you are a current or former smoker.  Osteoporosis. You may be screened starting at age 41 if you are at high risk. Talk with your health care provider about your test results, treatment options, and if necessary, the need for more  tests. Vaccines  Your health care provider may recommend certain vaccines, such as:  Influenza vaccine. This is recommended every year.  Tetanus, diphtheria, and acellular pertussis (Tdap, Td) vaccine. You may need a Td booster every 10 years.  Zoster  vaccine. You may need this after age 82.  Pneumococcal 13-valent conjugate (PCV13) vaccine. One dose is recommended after age 26.  Pneumococcal polysaccharide (PPSV23) vaccine. One dose is recommended after age 35. Talk to your health care provider about which screenings and vaccines you need and how often you need them. This information is not intended to replace advice given to you by your health care provider. Make sure you discuss any questions you have with your health care provider. Document Released: 11/21/2015 Document Revised: 07/14/2016 Document Reviewed: 08/26/2015 Elsevier Interactive Patient Education  2017 [place] Prevention in the Home Falls can cause injuries. They can happen to people of all ages. There are many things you can do to make your home safe and to help prevent falls. What can I do on the outside of my home?  Regularly fix the edges of walkways and driveways and fix any cracks.  Remove anything that might make you trip as you walk through a door, such as a raised step or threshold.  Trim any bushes or trees on the path to your home.  Use bright outdoor lighting.  Clear any walking paths of anything that might make someone trip, such as rocks or tools.  Regularly check to see if handrails are loose or broken. Make sure that both sides of any steps have handrails.  Any raised decks and porches should have guardrails on the edges.  Have any leaves, snow, or ice cleared regularly.  Use sand or salt on walking paths during winter.  Clean up any spills in your garage right away. This includes oil or grease spills. What can I do in the bathroom?  Use night lights.  Install grab bars by the toilet and in the tub and shower. Do not use towel bars as grab bars.  Use non-skid mats or decals in the tub or shower.  If you need to sit down in the shower, use a plastic, non-slip stool.  Keep the floor dry. Clean up any water that spills on the  floor as soon as it happens.  Remove soap buildup in the tub or shower regularly.  Attach bath mats securely with double-sided non-slip rug tape.  Do not have throw rugs and other things on the floor that can make you trip. What can I do in the bedroom?  Use night lights.  Make sure that you have a light by your bed that is easy to reach.  Do not use any sheets or blankets that are too big for your bed. They should not hang down onto the floor.  Have a firm chair that has side arms. You can use this for support while you get dressed.  Do not have throw rugs and other things on the floor that can make you trip. What can I do in the kitchen?  Clean up any spills right away.  Avoid walking on wet floors.  Keep items that you use a lot in easy-to-reach places.  If you need to reach something above you, use a strong step stool that has a grab bar.  Keep electrical cords out of the way.  Do not use floor polish or wax that makes floors slippery. If you must use wax, use non-skid floor  wax.  Do not have throw rugs and other things on the floor that can make you trip. What can I do with my stairs?  Do not leave any items on the stairs.  Make sure that there are handrails on both sides of the stairs and use them. Fix handrails that are broken or loose. Make sure that handrails are as long as the stairways.  Check any carpeting to make sure that it is firmly attached to the stairs. Fix any carpet that is loose or worn.  Avoid having throw rugs at the top or bottom of the stairs. If you do have throw rugs, attach them to the floor with carpet tape.  Make sure that you have a light switch at the top of the stairs and the bottom of the stairs. If you do not have them, ask someone to add them for you. What else can I do to help prevent falls?  Wear shoes that:  Do not have high heels.  Have rubber bottoms.  Are comfortable and fit you well.  Are closed at the toe. Do not wear  sandals.  If you use a stepladder:  Make sure that it is fully opened. Do not climb a closed stepladder.  Make sure that both sides of the stepladder are locked into place.  Ask someone to hold it for you, if possible.  Clearly mark and make sure that you can see:  Any grab bars or handrails.  First and last steps.  Where the edge of each step is.  Use tools that help you move around (mobility aids) if they are needed. These include:  Canes.  Walkers.  Scooters.  Crutches.  Turn on the lights when you go into a dark area. Replace any light bulbs as soon as they burn out.  Set up your furniture so you have a clear path. Avoid moving your furniture around.  If any of your floors are uneven, fix them.  If there are any pets around you, be aware of where they are.  Review your medicines with your doctor. Some medicines can make you feel dizzy. This can increase your chance of falling. Ask your doctor what other things that you can do to help prevent falls. This information is not intended to replace advice given to you by your health care provider. Make sure you discuss any questions you have with your health care provider. Document Released: 08/21/2009 Document Revised: 04/01/2016 Document Reviewed: 11/29/2014 Elsevier Interactive Patient Education  2017 [employer].

## 2020-06-04 NOTE — Progress Notes (Signed)
Virtual Visit via Telephone Note  I connected with  Kevin [name] on 06/04/20 at  9:30 AM EDT by telephone and verified that I am speaking with the correct person using two identifiers.  Medicare Annual Wellness visit completed telephonically due to Covid-19 pandemic.   Persons participating in this call: This Health Coach, this patient and wife [name]  Location: Patient: Home Provider: Office   I discussed the limitations, risks, security and privacy concerns of performing an evaluation and management service by telephone and the availability of in person appointments. The patient expressed understanding and agreed to proceed.  Unable to perform video visit due to video visit attempted and failed and/or patient does not have video capability.   Some vital signs may be absent or patient reported.   Willette Brace, LPN    Subjective:   Kevin [name] is a 74 y.o. male who presents for Medicare Annual/Subsequent preventive examination.  Review of Systems     Cardiac Risk Factors include: male gender;diabetes mellitus;hypertension;dyslipidemia     Objective:    There were no vitals filed for this visit. There is no height or weight on file to calculate BMI.  Advanced Directives 06/04/2020 03/02/2019 10/30/2016 01/22/2015 08/28/2014 01/27/2012 12/01/2011  Does Patient Have a Medical Advance Directive? Yes Yes Yes Yes No Patient has advance directive, copy in chart Patient has advance directive, copy not in chart  Type of Advance Directive [place];Living will - [place];Living will Healthcare Power of [place];Living will [place]  Does patient want to make changes to medical advance directive? - - - No - Patient declined - - -  Copy of [place] in Chart? No - copy requested - No - copy requested No - copy requested - - Copy requested from family  Would patient like  information on creating a medical advance directive? - - - - No - patient declined information - -  Pre-existing out of facility DNR order (yellow form or pink MOST form) - - - - - No -    Current Medications (verified) Outpatient Encounter Medications as of 06/04/2020  Medication Sig  . ACCU-CHEK COMPACT PLUS test strip use 1 TEST STRIP once daily as directed  . atorvastatin (LIPITOR) 80 MG tablet Take 40 mg by mouth daily at 6 PM.  . buPROPion (WELLBUTRIN XL) 150 MG 24 hr tablet Take 2 tablets daily  . carvedilol (COREG) 12.5 MG tablet Take 6.25 mg by mouth 2 (two) times daily with a meal.  . CHELATED MAGNESIUM PO Take 250 mg by mouth daily.  . clobetasol ointment (TEMOVATE) 6.78 % Apply 1 application topically daily as needed (itching).   . clopidogrel (PLAVIX) 75 MG tablet Take 1 tablet (75 mg total) by mouth daily.  . Continuous Blood Gluc Receiver (FREESTYLE LIBRE 14 DAY READER) DEVI 1 each by Does not apply route See admin instructions. Use Freestyle Libre Reader to monitor blood sugars.  . Continuous Blood Gluc Sensor (FREESTYLE LIBRE 14 DAY SENSOR) MISC USE FREESTYLE LIBRE SENSOR TO MONITOR BLOOD SUGAR AS DIRECTED  . donepezil (ARICEPT) 5 MG tablet Take 1 tablet daily  . latanoprost (XALATAN) 0.005 % ophthalmic solution Place 1 drop into the right eye daily.   Marland Kitchen LORazepam (ATIVAN) 0.5 MG tablet Take 0.5 mg by mouth every 8 (eight) hours. As needed  . memantine (NAMENDA) 10 MG tablet Take 1 tablet every night for 2 weeks, then increase to  1 tablet twice a day (Patient taking differently: 1 tablet twice a day)  . SitaGLIPtin-MetFORMIN HCl (JANUMET XR) 775 739 0570 MG TB24 Take 1 tablet by mouth daily.  Marland Kitchen VIAGRA 100 MG tablet TAKE ONE TABLET BY MOUTH AS NEEDED FOR ERECTILE DYSFUNCTION.  . [DISCONTINUED] busPIRone (BUSPAR) 5 MG tablet Take 1 tablet twice a day for a week, then may increase to three times a day (Patient not taking: Reported on 03/13/2020)   No facility-administered encounter  medications on file as of 06/04/2020.    Allergies (verified) Sulfa antibiotics   History: Past Medical History:  Diagnosis Date  . Agent orange exposure    in [place]  . Anemia   . Heart block AV second degree    a. s/p PPM implant with subsequent CRTD upgrade  . High cholesterol   . History of colon polyps   . Hypertension   . LBBB (left bundle branch block)   . Nonischemic cardiomyopathy ([place])    a. MDT CRTD upgrade 2016  . Pacemaker 08/27/2013   Dual-chamber Medtronic Adapta implanted January 2013 for bradycardia with alternating bundle branch block and 2:1 atrioventricular block   . Prostate cancer ([place])    "not been treated yet; on a wait and see" (01/22/2015)  . Type II diabetes mellitus ([place])    Past Surgical History:  Procedure Laterality Date  . BASAL CELL CARCINOMA EXCISION     "back, face, neck"  . BI-VENTRICULAR IMPLANTABLE CARDIOVERTER DEFIBRILLATOR UPGRADE N/A 01/22/2015   MDT CRTD upgrade by Dr Rayann Heman  . CARDIAC CATHETERIZATION  01/27/2012   normal coronaries, dilated LV c/w nonischemic cardiomyopathy  . EXAMINATION UNDER ANESTHESIA  04/06/2012   Procedure: EXAM UNDER ANESTHESIA;  Surgeon: Marcello Moores A. Cornett, MD;  Location: [facility];  Service: General;  Laterality: N/A;  . INCISION AND DRAINAGE PERIRECTAL ABSCESS  ~ 2010; 2015  . INGUINAL HERNIA REPAIR Left 1980  . LEFT HEART CATHETERIZATION WITH CORONARY ANGIOGRAM N/A 01/27/2012   Procedure: LEFT HEART CATHETERIZATION WITH CORONARY ANGIOGRAM;  Surgeon: Troy Sine, MD;  Location: [facility] CATH LAB;  Service: Cardiovascular;  Laterality: N/A;  . NM MYOCAR PERF WALL MOTION  01/21/2012   mod-severe perfusion defect due to infarct/scar w/mild perinfarct ischemia in the apical,basal inferoseptal,basal inferior,mid inferoseptal,midinferior and apical inferior regions  . PERMANENT PACEMAKER INSERTION N/A 12/01/2011   Medtronic implanted by Dr Sallyanne Kuster  . PROSTATE BIOPSY    . PROSTATE BIOPSY    . shrapnel surgery     "in  [place]"  . TONSILLECTOMY     Family History  Problem Relation Age of Onset  . Ovarian cancer Mother   . Leukemia Father   . Heart disease Father   . Diabetes Neg Hx    Social History   Socioeconomic History  . Marital status: Married    Spouse name: Not on file  . Number of children: 0  . Years of education: Not on file  . Highest education level: Not on file  Occupational History  . Occupation: Surveyor, minerals: [employer]  . Occupation: Retired  Tobacco Use  . Smoking status: Former Smoker    Packs/day: 1.00    Years: 20.00    Pack years: 20.00    Types: Cigarettes    Quit date: 04/13/1984    Years since quitting: 36.1  . Smokeless tobacco: Never Used  Vaping Use  . Vaping Use: Never used  Substance and Sexual Activity  . Alcohol use: Yes    Comment: 01/22/2015 "  maybe 3 beers or glasses of wine /month"  . Drug use: No  . Sexual activity: Yes    Partners: Female  Other Topics Concern  . Not on file  Social History Narrative   Pt lives in [place] with spouse in 2 story home. He has three step children. 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 [place] Veteran. 14 years of education.    Social Determinants of Health   Financial Resource Strain: Low Risk   . Difficulty of Paying Living Expenses: Not hard at all  Food Insecurity: No Food Insecurity  . Worried About Charity fundraiser in the Last Year: Never true  . Ran Out of Food in the Last Year: Never true  Transportation Needs: No Transportation Needs  . Lack of Transportation (Medical): No  . Lack of Transportation (Non-Medical): No  Physical Activity: Sufficiently Active  . Days of Exercise per Week: 2 days  . Minutes of Exercise per Session: 150+ min  Stress: No Stress Concern Present  . Feeling of Stress : Not at all  Social Connections: Socially Integrated  . Frequency of Communication with Friends and Family: More than three times a week  . Frequency of  Social Gatherings with Friends and Family: Three times a week  . Attends Religious Services: More than 4 times per year  . Active Member of Clubs or Organizations: Yes  . Attends Archivist Meetings: More than 4 times per year  . Marital Status: Married    Tobacco Counseling Counseling given: Not Answered   Clinical Intake:  Pre-visit preparation completed: Yes  Pain : No/denies pain     BMI - recorded: 22.89 Nutritional Status: BMI of 19-24  Normal Nutritional Risks: None Diabetes: Yes CBG done?: Yes (121) CBG resulted in Enter/ Edit results?: Yes (121) Did pt. bring in CBG monitor from home?: No  How often do you need to have someone help you when you read instructions, pamphlets, or other written materials from your doctor or pharmacy?: 1 - Never  Diabetic?Yes  Interpreter Needed?: No  Information entered by :: Charlott Rakes, LPN   Activities of Daily Living In your present state of health, do you have any difficulty performing the following activities: 06/04/2020  Hearing? N  Vision? N  Difficulty concentrating or making decisions? N  Walking or climbing stairs? N  Dressing or bathing? N  Doing errands, shopping? N  Preparing Food and eating ? N  Using the Toilet? N  In the past six months, have you accidently leaked urine? N  Do you have problems with loss of bowel control? N  Managing your Medications? N  Managing your Finances? N  Housekeeping or managing your Housekeeping? N  Some recent data might be hidden    Patient Care Team: [name], MD as PCP - General (Family Medicine) Cameron Sprang, MD as Consulting Physician (Neurology) Earnie Larsson, [facility] as Pharmacist (Pharmacist)  Indicate any recent Medical Services you may have received from other than [facility] providers in the past year (date may be approximate).     Assessment:   This is a routine wellness examination for [place].  Hearing/Vision screen  Hearing Screening    125Hz  250Hz  500Hz  1000Hz  2000Hz  3000Hz  4000Hz  6000Hz  8000Hz   Right ear:           Left ear:           Comments: Pt has ringing in the ears at times and wear the hearing aids  as needed   Dietary issues and exercise activities discussed: Current Exercise Habits: The patient does not participate in regular exercise at present  Goals    . patient     Will check on point of entry for older adults to serve on boards or other work  Will consider working more days at [employer]; [phone] [address] [place] [employer]; [phone] Get resource to get information on any and all community programs for [employer]: [phone]   Deaf & Hard of Hearing Division Services - can assist with hearing aid x 1  No reviews  [place]  [place] [id number]  [phone]       . Patient Stated     Continue to stay healthy    . Pharmacy Care Plan     CARE PLAN ENTRY  Current Barriers:  . Chronic Disease Management support, education, and care coordination needs related to Hypertension, Hyperlipidemia, Diabetes, Heart Failure, Anxiety, and Intracranial stenosis, mild cognitive impairment    Hypertension . Pharmacist Clinical Goal(s): o Over the next 180 days, patient will work with PharmD and providers to maintain BP goal <130/80 . Current regimen:  o Carvedilol 12.5mg , 0.5 tablet twice daily   . Patient self care activities - Over the next 180 days, patient will: o Check BP at home, document, and provide at future appointments  Hyperlipidemia . Pharmacist Clinical Goal(s): o Over the next 180 days, patient will work with PharmD and providers to maintain LDL goal < 70 . Current regimen:  o Atorvastatin 80mg , 40mg  daily at 6 pm  . Patient self care activities - Over the next 180 days, patient will: o Continue current medication.   Diabetes . Pharmacist Clinical Goal(s): o Over the next 180 days, patient will work with PharmD and providers to maintain A1c goal  <7% . Current regimen:  o Janumet XR 100-1000mg , 1 tablet daily  . Patient self care activities - Over the next 180 days, patient will: o Check blood sugar using Freestyle Libre, document, and provide at future appointments  Heart failure  . Pharmacist Clinical Goal(s) o Over the next 180 days, patient will work with PharmD and providers to  . Current regimen:  o Carvedilol 12.5mg , 0.5 tablet twice daily   . Patient self care activities - Over the next 180 days, patient will: o Continue current medication and report shortness/ swelling to providers.  Intracranial stenosis  . Pharmacist Clinical Goal(s) o Over the next 180 days, patient will work with PharmD and providers to continue current medications to decrease stroke event.  . Current regimen:   Clopidogrel 75mg , 1 tablet once daily  ASA 81mg , 1 tablet daily . Patient self care activities o Patient will continue current medications for 3 months and then only continue clopidogrel.   Anxiety . Pharmacist Clinical Goal(s) o Over the next 180 days, patient will work with PharmD and providers to continue current medications to control mood.  . Current regimen:   Bupropion (Wellbutrin XL) 150mg , 2 tablets once daily   Lorazepam 0.5mg , 1 tablet every eight hours as needed  . Patient self care activities o Patient will continue current medications.   Mild cognitive impairment . Pharmacist Clinical Goal(s) o Over the next 180 days, patient will work with PharmD and providers to continue current medications to prevent worsening of memory.  . Current regimen:   Donepezil 5mg , 1 tablet once daily  memantine (Namenda) 10mg , 1 tablet twice a day .  Patient self care activities o Patient will continue current medications and follow up visit with Dr. Ellouise Newer  (neurology).     Medication management . Pharmacist Clinical Goal(s): o Over the next 180 days, patient will work with PharmD and providers to maintain optimal  medication adherence . Current pharmacy: [facility] and [place]  . Interventions o Comprehensive medication review performed. o Continue current medication management strategy . Patient self care activities - Over the next 180 days, patient will: o Take medications as prescribed o Report any questions or concerns to PharmD and/or provider(s)  Initial goal documentation       Depression Screen PHQ 2/9 Scores 06/04/2020 03/03/2018 09/21/2016 01/19/2016 01/19/2016 08/29/2014 08/23/2013  PHQ - 2 Score 0 0 0 0 0 0 0    Fall Risk Fall Risk  06/04/2020 10/17/2019 03/02/2019 07/14/2018 03/03/2018  Falls in the past year? 0 1 1 Yes Yes  Number falls in past yr: 0 0 0 2 or more 1  Injury with Fall? 0 0 0 No No  Risk Factor Category  - - - High Fall Risk -  Risk for fall due to : Impaired balance/gait;Impaired vision;Other (Comment) Impaired balance/gait;Medication side effect - - -  Risk for fall due to: Comment get dizzy at times Dizziness due to tinnitus - - -  Follow up - Falls evaluation completed - Falls evaluation completed -    Any stairs in or around the home? Yes  If so, are there any without handrails? No  Home free of loose throw rugs in walkways, pet beds, electrical cords, etc? Yes  Adequate lighting in your home to reduce risk of falls? Yes   ASSISTIVE DEVICES UTILIZED TO PREVENT FALLS:  Life alert? No  Use of a cane, walker or w/c? No  Grab bars in the bathroom? Yes  Shower chair or bench in shower? No  Elevated toilet seat or a handicapped toilet? No   TIMED UP AND GO:  Was the test performed? No .  Cognitive Function: MMSE - Mini Mental State Exam 09/21/2016 09/21/2016  Orientation to time 5 5  Orientation to Place 5 5  Registration 3 3  Attention/ Calculation 5 5  Recall 2 2  Language- name 2 objects 2 2  Language- repeat 1 1  Language- follow 3 step command 3 3  Language- read & follow direction 1 1  Write a sentence 1 -  Copy design 0 -  Total score 28 -    Montreal Cognitive Assessment  03/05/2019 07/14/2018  Visuospatial/ Executive (0/5) 2 4  Naming (0/3) 2 3  Attention: Read list of digits (0/2) 2 2  Attention: Read list of letters (0/1) 1 1  Attention: Serial 7 subtraction starting at 100 (0/3) 2 3  Language: Repeat phrase (0/2) 2 2  Language : Fluency (0/1) 0 1  Abstraction (0/2) 1 2  Delayed Recall (0/5) 0 0  Orientation (0/6) 5 6  Total 17 24   6CIT Screen 06/04/2020  What time? 0 points  Count back from 20 0 points  Months in reverse 4 points  Repeat phrase 10 points    Immunizations Immunization History  Administered Date(s) Administered  . DTaP 11/15/2008  . Influenza Split 09/20/2012  . Influenza,inj,Quad PF,6+ Mos 08/23/2013, 08/29/2014  . Influenza-Unspecified 08/13/2015, 07/03/2016, 08/02/2018  . Pneumococcal-Unspecified 08/08/2014  . Td 11/15/2008    TDAP status: Due, Education has been provided regarding the importance of this vaccine. Advised may receive this vaccine at local pharmacy or Health Dept.  Aware to provide a copy of the vaccination record if obtained from local pharmacy or Health Dept. Verbalized acceptance and understanding. Flu Vaccine status: Up to date Pneumococcal vaccine status: Up to date Covid-19 vaccine status: Completed vaccines pt will bring a copy  Qualifies for Shingles Vaccine? Yes   Zostavax completed No   Shingrix Completed?: Yes  Patient stated done at [facility] Maintenance  Topic Date Due  . Hepatitis C Screening  Never done  . OPHTHALMOLOGY EXAM  Never done  . COVID-19 Vaccine (1) Never done  . PNA vac Low Risk Adult (2 of 2 - PCV13) 08/09/2015  . COLONOSCOPY  08/20/2016  . TETANUS/TDAP  11/15/2018  . FOOT EXAM  07/25/2019  . HEMOGLOBIN A1C  05/29/2020  . URINE MICROALBUMIN  07/19/2020  . INFLUENZA VACCINE  06/08/2020    Health Maintenance  Health Maintenance Due  Topic Date Due  . Hepatitis C Screening  Never done  . OPHTHALMOLOGY EXAM   Never done  . COVID-19 Vaccine (1) Never done  . PNA vac Low Risk Adult (2 of 2 - PCV13) 08/09/2015  . COLONOSCOPY  08/20/2016  . TETANUS/TDAP  11/15/2018  . FOOT EXAM  07/25/2019  . HEMOGLOBIN A1C  05/29/2020  . URINE MICROALBUMIN  07/19/2020    Colorectal cancer screening: Referral to GI placed 06/04/20. Pt aware the office will call re: appt.   Vision Screening: Recommended annual ophthalmology exams for early detection of glaucoma and other disorders of the eye. Is the patient up to date with their annual eye exam?  Yes  Who is the provider or what is the [facility] the [facility] in which the [facility]  Dental Screening: Recommended annual dental exams for proper oral hygiene  Community Resource Referral / Chronic Care Management: CRR required this visit?  No   CCM required this visit?  No      Plan:     I have personally reviewed and noted the following in the patient's chart:   . Medical and social history . Use of alcohol, tobacco or illicit drugs  . Current medications and supplements . Functional ability and status . Nutritional status . Physical activity . Advanced directives . List of other physicians . Hospitalizations, surgeries, and ER visits in previous 12 months . Vitals . Screenings to include cognitive, depression, and falls . Referrals and appointments  In addition, I have reviewed and discussed with patient certain preventive protocols, quality metrics, and best practice recommendations. A written personalized care plan for preventive services as well as general preventive health recommendations were provided to patient.     Willette Brace, LPN   [id number]   Nurse Notes: Pt complained of dizziness at times over the past 3 months, stating he hasn't fallen and believes it's related to allergies. He also stated that he has experienced ringing in both ears at times. Fall prevention discussed. Please advise.

## 2020-06-16 ENCOUNTER — Ambulatory Visit (INDEPENDENT_AMBULATORY_CARE_PROVIDER_SITE_OTHER): Payer: Medicare HMO | Admitting: *Deleted

## 2020-06-16 DIAGNOSIS — I441 Atrioventricular block, second degree: Secondary | ICD-10-CM

## 2020-06-16 LAB — CUP PACEART REMOTE DEVICE CHECK
Battery Remaining Longevity: 3 mo
Battery Voltage: 2.74 V
Brady Statistic AP VP Percent: 0.04 %
Brady Statistic AP VS Percent: 0.01 %
Brady Statistic AS VP Percent: 99.94 %
Brady Statistic AS VS Percent: 0.01 %
Brady Statistic RA Percent Paced: 0.05 %
Brady Statistic RV Percent Paced: 99.97 %
Date Time Interrogation Session: 20210809012503
HighPow Impedance: 78 Ohm
Implantable Lead Implant Date: 20130123
Implantable Lead Implant Date: 20160316
Implantable Lead Implant Date: 20160316
Implantable Lead Location: 753858
Implantable Lead Location: 753859
Implantable Lead Location: 753860
Implantable Lead Model: 4598
Implantable Lead Model: 5076
Implantable Lead Model: 6935
Implantable Pulse Generator Implant Date: 20160316
Lead Channel Impedance Value: 1064 Ohm
Lead Channel Impedance Value: 1083 Ohm
Lead Channel Impedance Value: 399 Ohm
Lead Channel Impedance Value: 399 Ohm
Lead Channel Impedance Value: 437 Ohm
Lead Channel Impedance Value: 475 Ohm
Lead Channel Impedance Value: 513 Ohm
Lead Channel Impedance Value: 513 Ohm
Lead Channel Impedance Value: 646 Ohm
Lead Channel Impedance Value: 703 Ohm
Lead Channel Impedance Value: 798 Ohm
Lead Channel Impedance Value: 836 Ohm
Lead Channel Impedance Value: 988 Ohm
Lead Channel Pacing Threshold Amplitude: 0.5 V
Lead Channel Pacing Threshold Amplitude: 0.5 V
Lead Channel Pacing Threshold Amplitude: 3 V
Lead Channel Pacing Threshold Pulse Width: 0.4 ms
Lead Channel Pacing Threshold Pulse Width: 0.4 ms
Lead Channel Pacing Threshold Pulse Width: 1 ms
Lead Channel Sensing Intrinsic Amplitude: 3.875 mV
Lead Channel Sensing Intrinsic Amplitude: 3.875 mV
Lead Channel Sensing Intrinsic Amplitude: 8.375 mV
Lead Channel Sensing Intrinsic Amplitude: 8.375 mV
Lead Channel Setting Pacing Amplitude: 2 V
Lead Channel Setting Pacing Amplitude: 2.5 V
Lead Channel Setting Pacing Amplitude: 3.5 V
Lead Channel Setting Pacing Pulse Width: 0.4 ms
Lead Channel Setting Pacing Pulse Width: 1 ms
Lead Channel Setting Sensing Sensitivity: 0.3 mV

## 2020-06-17 NOTE — Progress Notes (Signed)
Remote ICD transmission.   

## 2020-06-21 ENCOUNTER — Encounter: Payer: Self-pay | Admitting: Family Medicine

## 2020-06-23 ENCOUNTER — Ambulatory Visit (INDEPENDENT_AMBULATORY_CARE_PROVIDER_SITE_OTHER): Payer: Medicare HMO

## 2020-06-23 DIAGNOSIS — Z95810 Presence of automatic (implantable) cardiac defibrillator: Secondary | ICD-10-CM | POA: Diagnosis not present

## 2020-06-23 DIAGNOSIS — I5042 Chronic combined systolic (congestive) and diastolic (congestive) heart failure: Secondary | ICD-10-CM

## 2020-06-25 NOTE — Progress Notes (Signed)
EPIC Encounter for ICM Monitoring  Patient Name: Kevin [name] is a 74 y.o. male Date: 06/25/2020 Primary Care Physican: [name], MD Primary Cardiologist:[name] Electrophysiologist: [name] Bi-V Pacing: 99.9% LastWeight:185lbs  Battery ERI73months  Transmission reviewed.   OptivolThoracic impedance normal.  Labs: 11/30/2019 Creatinine 1.19, BUN 27, Potassium 4.7, Sodium 139, GFR 59.76 07/20/2019 Creatinine 1.22, BUN 14, Potassium 4.5, Sodium 140, GFR 58.12 02/27/2019 Creatinine 1.12, BUN 15, Potassium 4.6, Sodium 140, GFR 64.22  Patient does not take a diuretic.  Recommendations:No changes.  Follow-up plan: ICM clinic phone appointment on 07/28/2020. 91 day device clinic remote transmission10/09/2020.   EP/Cardiology Office Visits: 01/06/2021 with Dr. Rayann Heman.    Copy of ICM check sent to Dr. Rayann Heman.   3 month ICM trend: 06/23/2020    1 Year ICM trend:       Rosalene Billings, RN 06/25/2020 9:35 AM

## 2020-07-09 ENCOUNTER — Other Ambulatory Visit: Payer: Self-pay | Admitting: Endocrinology

## 2020-08-04 ENCOUNTER — Telehealth: Payer: Self-pay | Admitting: Internal Medicine

## 2020-08-04 NOTE — Progress Notes (Signed)
No ICM remote transmission received for 07/28/2020 and next ICM transmission scheduled for 09/01/2020.   

## 2020-08-04 NOTE — Telephone Encounter (Signed)
Patient states his battery needs to be replaced in his device. Please advise.

## 2020-08-04 NOTE — Telephone Encounter (Signed)
  Pt device alarming and likely at ERI. Last transmission 06/2020 showed approx 2 months left.  I have asked him to send a transmission to confirm, as we have not received the alert yet.   Will need to be schedule with Dr. Rayann Heman for gen change, and usual device follow up (once ERI confirmed)  Beryle Beams" [place], PA-C  08/04/2020 12:16 PM

## 2020-08-07 NOTE — Telephone Encounter (Signed)
Spoke with patient and explained device is most likely at ERI. He is not at home but will send a remote transmission this afternoon after he returns home. He is expecting a call from a scheduler to be set up with appointment with Dr Rayann Heman to discuss ICD gen change. Patient is agreeable to virtual visit if Dr Rayann Heman has an appointment available. He will call the DC after he sends the transmission today.

## 2020-08-08 ENCOUNTER — Telehealth: Payer: Self-pay | Admitting: Emergency Medicine

## 2020-08-08 ENCOUNTER — Other Ambulatory Visit: Payer: Self-pay | Admitting: Endocrinology

## 2020-08-08 NOTE — Telephone Encounter (Signed)
Patient notified device clinic appointment cancelled on 08/14/20 due to in -person visit with Dr Rayann Heman 08/15/20. Reassured that device will be checked at that appointment.

## 2020-08-08 NOTE — Telephone Encounter (Signed)
Patient unable to send remote transmission. Given tech support  # to assist with sending transmission due to audible alert from device. Patient most likely at [facility] , 2 months left until [facility] 06/23/20. 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 No CP, SOP, syncope or chest tightness with episodes. Will have scheduler contact to set up appointment to discuss gen change with Dr Rayann Heman. Patient to contact [facility] after speaking with [employer] tech support to find out if transmission was sent.

## 2020-08-10 ENCOUNTER — Other Ambulatory Visit: Payer: Self-pay | Admitting: Endocrinology

## 2020-08-11 ENCOUNTER — Ambulatory Visit (INDEPENDENT_AMBULATORY_CARE_PROVIDER_SITE_OTHER): Payer: Medicare HMO

## 2020-08-11 DIAGNOSIS — I5042 Chronic combined systolic (congestive) and diastolic (congestive) heart failure: Secondary | ICD-10-CM

## 2020-08-11 DIAGNOSIS — Z95810 Presence of automatic (implantable) cardiac defibrillator: Secondary | ICD-10-CM | POA: Diagnosis not present

## 2020-08-13 NOTE — Progress Notes (Signed)
EPIC Encounter for ICM Monitoring  Patient Name: Kevin [name] is a 74 y.o. male Date: 08/13/2020 Primary Care Physican: [name], MD Primary Cardiologist:[name] Electrophysiologist: [name] Bi-V Pacing: 99.9% LastWeight:185lbs  Battery ERIreached 07/15/2020  Transmission reviewed.  OptivolThoracic impedance normal.  Labs: 02/06/2020 Creatinine 1.04, BUN 11, Potassium 4.3, Sodium 137, GFR 69.77 A complete set of results can be found in Results Review.  Patient does not take a diuretic.  Recommendations:No changes.  Follow-up plan: ICM clinic phone appointment on 09/19/2020. 91 day device clinic remote transmission10/09/2020.   EP/Cardiology Office Visits:08/15/2020 with Dr Rayann Heman for battery replacement.  3/1/2022with Dr. Rayann Heman.   Copy of ICM check sent to Dr.[name].   3 month ICM trend: 08/11/2020    1 Year ICM trend:       Rosalene Billings, RN 08/13/2020 1:16 PM

## 2020-08-15 ENCOUNTER — Encounter: Payer: Self-pay | Admitting: *Deleted

## 2020-08-15 ENCOUNTER — Ambulatory Visit (INDEPENDENT_AMBULATORY_CARE_PROVIDER_SITE_OTHER): Payer: Medicare HMO | Admitting: Internal Medicine

## 2020-08-15 ENCOUNTER — Encounter: Payer: Self-pay | Admitting: Internal Medicine

## 2020-08-15 ENCOUNTER — Other Ambulatory Visit: Payer: Self-pay

## 2020-08-15 VITALS — BP 124/76 | HR 75 | Ht 76.0 in | Wt 177.0 lb

## 2020-08-15 DIAGNOSIS — I5042 Chronic combined systolic (congestive) and diastolic (congestive) heart failure: Secondary | ICD-10-CM | POA: Diagnosis not present

## 2020-08-15 DIAGNOSIS — I442 Atrioventricular block, complete: Secondary | ICD-10-CM | POA: Diagnosis not present

## 2020-08-15 DIAGNOSIS — Z95810 Presence of automatic (implantable) cardiac defibrillator: Secondary | ICD-10-CM | POA: Diagnosis not present

## 2020-08-15 DIAGNOSIS — I428 Other cardiomyopathies: Secondary | ICD-10-CM | POA: Diagnosis not present

## 2020-08-15 LAB — CUP PACEART INCLINIC DEVICE CHECK
Battery Remaining Longevity: 1 mo — CL
Battery Voltage: 2.68 V
Brady Statistic AP VP Percent: 0.06 %
Brady Statistic AP VS Percent: 0.01 %
Brady Statistic AS VP Percent: 99.86 %
Brady Statistic AS VS Percent: 0.08 %
Brady Statistic RA Percent Paced: 0.07 %
Brady Statistic RV Percent Paced: 99.85 %
Date Time Interrogation Session: 20211008092746
HighPow Impedance: 85 Ohm
Implantable Lead Implant Date: 20130123
Implantable Lead Implant Date: 20160316
Implantable Lead Implant Date: 20160316
Implantable Lead Location: 753858
Implantable Lead Location: 753859
Implantable Lead Location: 753860
Implantable Lead Model: 4598
Implantable Lead Model: 5076
Implantable Lead Model: 6935
Implantable Pulse Generator Implant Date: 20160316
Lead Channel Impedance Value: 1007 Ohm
Lead Channel Impedance Value: 1083 Ohm
Lead Channel Impedance Value: 1178 Ohm
Lead Channel Impedance Value: 1216 Ohm
Lead Channel Impedance Value: 418 Ohm
Lead Channel Impedance Value: 437 Ohm
Lead Channel Impedance Value: 475 Ohm
Lead Channel Impedance Value: 513 Ohm
Lead Channel Impedance Value: 627 Ohm
Lead Channel Impedance Value: 627 Ohm
Lead Channel Impedance Value: 684 Ohm
Lead Channel Impedance Value: 779 Ohm
Lead Channel Impedance Value: 912 Ohm
Lead Channel Pacing Threshold Amplitude: 0.5 V
Lead Channel Pacing Threshold Amplitude: 0.75 V
Lead Channel Pacing Threshold Amplitude: 2.25 V
Lead Channel Pacing Threshold Pulse Width: 0.4 ms
Lead Channel Pacing Threshold Pulse Width: 0.4 ms
Lead Channel Pacing Threshold Pulse Width: 1 ms
Lead Channel Sensing Intrinsic Amplitude: 4.1 mV
Lead Channel Setting Pacing Amplitude: 2 V
Lead Channel Setting Pacing Amplitude: 2.5 V
Lead Channel Setting Pacing Amplitude: 3.75 V
Lead Channel Setting Pacing Pulse Width: 0.4 ms
Lead Channel Setting Pacing Pulse Width: 1 ms
Lead Channel Setting Sensing Sensitivity: 0.3 mV

## 2020-08-15 LAB — CBC WITH DIFFERENTIAL/PLATELET
Basophils Absolute: 0 10*3/uL (ref 0.0–0.2)
Basos: 0 %
EOS (ABSOLUTE): 0.1 10*3/uL (ref 0.0–0.4)
Eos: 1 %
Hematocrit: 47.5 % (ref 37.5–51.0)
Hemoglobin: 16.3 g/dL (ref 13.0–17.7)
Immature Grans (Abs): 0 10*3/uL (ref 0.0–0.1)
Immature Granulocytes: 0 %
Lymphocytes Absolute: 1.2 10*3/uL (ref 0.7–3.1)
Lymphs: 16 %
MCH: 33.5 pg — ABNORMAL HIGH (ref 26.6–33.0)
MCHC: 34.3 g/dL (ref 31.5–35.7)
MCV: 98 fL — ABNORMAL HIGH (ref 79–97)
Monocytes Absolute: 0.7 10*3/uL (ref 0.1–0.9)
Monocytes: 9 %
Neutrophils Absolute: 5.6 10*3/uL (ref 1.4–7.0)
Neutrophils: 74 %
Platelets: 192 10*3/uL (ref 150–450)
RBC: 4.87 x10E6/uL (ref 4.14–5.80)
RDW: 11.9 % (ref 11.6–15.4)
WBC: 7.7 10*3/uL (ref 3.4–10.8)

## 2020-08-15 LAB — BASIC METABOLIC PANEL
BUN/Creatinine Ratio: 10 (ref 10–24)
BUN: 13 mg/dL (ref 8–27)
CO2: 24 mmol/L (ref 20–29)
Calcium: 9.7 mg/dL (ref 8.6–10.2)
Chloride: 104 mmol/L (ref 96–106)
Creatinine, Ser: 1.33 mg/dL — ABNORMAL HIGH (ref 0.76–1.27)
GFR calc Af Amer: 60 mL/min/{1.73_m2} (ref >59)
GFR calc non Af Amer: 52 mL/min/{1.73_m2} — ABNORMAL LOW (ref >59)
Glucose: 138 mg/dL — ABNORMAL HIGH (ref 65–99)
Potassium: 5.2 mmol/L (ref 3.5–5.2)
Sodium: 141 mmol/L (ref 134–144)

## 2020-08-15 NOTE — Patient Instructions (Addendum)
Medication Instructions:  Your physician recommends that you continue on your current medications as directed. Please refer to the Current Medication list given to you today.  *If you need a refill on your cardiac medications before your next appointment, please call your pharmacy*  Lab Work: Cbc, bmp  If you have labs (blood work) drawn today and your tests are completely normal, you will receive your results only by: Marland Kitchen MyChart Message (if you have MyChart) OR . A paper copy in the mail If you have any lab test that is abnormal or we need to change your treatment, we will call you to review the results.  Testing/Procedures: None ordered.  Follow-Up: At [facility], you and your health needs are our priority.  As part of our continuing mission to provide you with exceptional heart care, we have created designated Provider Care Teams.  These Care Teams include your primary Cardiologist (physician) and Advanced Practice Providers (APPs -  Physician Assistants and Nurse Practitioners) who all work together to provide you with the care you need, when you need it.  We recommend signing up for the patient portal called "MyChart".  Sign up information is provided on this After Visit Summary.  MyChart is used to connect with patients for Virtual Visits (Telemedicine).  Patients are able to view lab/test results, encounter notes, upcoming appointments, etc.  Non-urgent messages can be sent to your provider as well.   To learn more about what you can do with MyChart, go to [email or web address].      Cardioverter Defibrillator Implantation, Care After This sheet gives you information about how to care for yourself after your procedure. Your health care provider may also give you more specific instructions. If you have problems or questions, contact your health care provider. What can I expect after the procedure? After the procedure, it is common to have:  Some pain. It may last a few  days.  A slight bump over the skin where the device was placed. Sometimes, it is possible to feel the device under the skin. This is normal. During the months and years after your procedure, your health care provider will check the device, the leads, and the battery every few months. Eventually, when the battery is low, the device will be replaced. Follow these instructions at home: Medicines  Take over-the-counter and prescription medicines only as told by your health care provider.  If you were prescribed an antibiotic medicine, take it as told by your health care provider. Do not stop taking the antibiotic even if you start to feel better. Incision care      Follow instructions from your health care provider about how to take care of your incision area. Make sure you: ? Wash your hands with soap and water before you change your bandage (dressing). If soap and water are not available, use hand sanitizer. ? Change your dressing as told by your health care provider. ? Leave stitches (sutures), skin glue, or adhesive strips in place. These skin closures may need to stay in place for 2 weeks or longer. If adhesive strip edges start to loosen and curl up, you may trim the loose edges. Do not remove adhesive strips completely unless your health care provider tells you to do that.  Check your incision area every day for signs of infection. Check for: ? More redness, swelling, or pain. ? More fluid or blood. ? Warmth. ? Pus or a bad smell.  Do not use lotions or ointments near the incision  area unless told by your health care provider.  Keep the incision area clean and dry for 2-3 days after the procedure or for as long as told by your health care provider. It takes several weeks for the incision site to heal completely.  Do not take baths, swim, or use a hot tub until your health care provider approves. Activity  Try to walk a little every day. Exercising is important after this  procedure. Also, use your shoulder on the side of the defibrillator in daily tasks that do not require a lot of motion.  For at least 6 weeks: ? Do not lift your upper arm above your shoulders. This means no tennis, golf, or swimming for this period of time. If you tend to sleep with your arm above your head, use a restraint to prevent this during sleep. ? Avoid sudden jerking, pulling, or chopping movements that pull your upper arm far away from your body.  Ask your health care provider when you may go back to work.  Check with your health care provider before you start to drive or play sports. Electric and magnetic fields  Tell all health care providers that you have a defibrillator. This may prevent them from giving you an MRI scan because strong magnets are used for that test.  If you must pass through a metal detector, quickly walk through it. Do not stop under the detector, and do not stand near it.  Avoid places or objects that have a strong electric or magnetic field, including: ? Airport [employer]bal[employer]. At the airport, let officials know that you have a defibrillator. Your defibrillator ID card will let you be checked in a way that is safe for you and will not damage your defibrillator. Also, do not let a security person wave a magnetic wand near your defibrillator. That can make it stop working. ? Power plants. ? Large electrical generators. ? Anti-theft systems or electronic article surveillance (EAS). ? Radiofrequency transmission towers, such as cell phone and radio towers.  Do not use amateur (ham) radio equipment or electric (arc) welding torches. Some devices are safe to use if held at least 12 inches (30 cm) from your defibrillator. These include power tools, lawn mowers, and speakers. If you are unsure if something is safe to use, ask your health care provider.  Do not use MP3 player headphones. They have magnets.  You may safely use electric blankets, heating pads,  computers, and microwave ovens.  When using your cell phone, hold it to the ear that is on the opposite side from the defibrillator. Do not leave your cell phone in a pocket over the defibrillator. General instructions  Follow diet instructions from your health care provider, if this applies.  Always keep your defibrillator ID card with you. The card should list the implant date, device model, and manufacturer. Consider wearing a medical alert bracelet or necklace.  Have your defibrillator checked every 3-6 months or as often as told by your health care provider. Most defibrillators last for 4-8 years.  Keep all follow-up visits as told by your health care provider. This is important for your health care provider to make sure your chest is healing the way it should. Ask your health care provider when you should come back to have your stitches or staples taken out. Contact a health care provider if:  You feel one shock in your chest.  You gain weight suddenly.  Your legs or feet swell more than they have  before.  It feels like your heart is fluttering or skipping beats (heart palpitations).  You have more redness, swelling, or pain around your incision.  You have more fluid or blood coming from your incision.  Your incision feels warm to the touch.  You have pus or a bad smell coming from your incision.  You have a fever. Get help right away if:  You have chest pain.  You feel more than one shock.  You feel more short of breath than you have felt before.  You feel more light-headed than you have felt before.  Your incision starts to open up. This information is not intended to replace advice given to you by your health care provider. Make sure you discuss any questions you have with your health care provider. Document Revised: 01/19/2018 Document Reviewed: 03/31/2016 Elsevier Patient Education  [place].

## 2020-08-15 NOTE — Progress Notes (Signed)
PCP: Kevin Post, MD Primary Cardiologist: Dr Sallyanne Kuster Primary EP: Dr Kevin [name] is a 74 y.o. male who presents today for routine electrophysiology followup.  Since last being seen in our clinic, the patient reports doing very well.  Today, he denies symptoms of palpitations, chest pain, shortness of breath,  lower extremity edema, dizziness, presyncope, syncope, or ICD shocks.  The patient is otherwise without complaint today.   Past Medical History:  Diagnosis Date  . Agent orange exposure    in [place]  . Anemia   . Heart block AV second degree    a. s/p PPM implant with subsequent CRTD upgrade  . High cholesterol   . History of colon polyps   . Hypertension   . LBBB (left bundle branch block)   . Nonischemic cardiomyopathy ([place])    a. MDT CRTD upgrade 2016  . Pacemaker 08/27/2013   Dual-chamber Medtronic Adapta implanted January 2013 for bradycardia with alternating bundle branch block and 2:1 atrioventricular block   . Prostate cancer ([place])    "not been treated yet; on a wait and see" (01/22/2015)  . Type II diabetes mellitus ([place])    Past Surgical History:  Procedure Laterality Date  . BASAL CELL CARCINOMA EXCISION     "back, face, neck"  . BI-VENTRICULAR IMPLANTABLE CARDIOVERTER DEFIBRILLATOR UPGRADE N/A 01/22/2015   MDT CRTD upgrade by Dr Rayann Heman  . CARDIAC CATHETERIZATION  01/27/2012   normal coronaries, dilated LV c/w nonischemic cardiomyopathy  . EXAMINATION UNDER ANESTHESIA  04/06/2012   Procedure: EXAM UNDER ANESTHESIA;  Surgeon: Marcello Moores A. Cornett, MD;  Location: [facility];  Service: General;  Laterality: N/A;  . INCISION AND DRAINAGE PERIRECTAL ABSCESS  ~ 2010; 2015  . INGUINAL HERNIA REPAIR Left 1980  . LEFT HEART CATHETERIZATION WITH CORONARY ANGIOGRAM N/A 01/27/2012   Procedure: LEFT HEART CATHETERIZATION WITH CORONARY ANGIOGRAM;  Surgeon: Troy Sine, MD;  Location: [facility] CATH LAB;  Service: Cardiovascular;  Laterality: N/A;  . NM MYOCAR PERF  WALL MOTION  01/21/2012   mod-severe perfusion defect due to infarct/scar w/mild perinfarct ischemia in the apical,basal inferoseptal,basal inferior,mid inferoseptal,midinferior and apical inferior regions  . PERMANENT PACEMAKER INSERTION N/A 12/01/2011   Medtronic implanted by Dr Sallyanne Kuster  . PROSTATE BIOPSY    . PROSTATE BIOPSY    . shrapnel surgery     "in [place]"  . TONSILLECTOMY      ROS- all systems are reviewed and negative except as per HPI above  Current Outpatient Medications  Medication Sig Dispense Refill  . ACCU-CHEK COMPACT PLUS test strip use 1 TEST STRIP once daily as directed 102 each 5  . atorvastatin (LIPITOR) 80 MG tablet Take 40 mg by mouth daily at 6 PM.    . buPROPion (WELLBUTRIN XL) 150 MG 24 hr tablet Take 2 tablets daily 180 tablet 3  . carvedilol (COREG) 12.5 MG tablet Take 6.25 mg by mouth 2 (two) times daily with a meal.    . CHELATED MAGNESIUM PO Take 250 mg by mouth daily.    . clobetasol ointment (TEMOVATE) 0.99 % Apply 1 application topically daily as needed (itching).     . clopidogrel (PLAVIX) 75 MG tablet Take 1 tablet (75 mg total) by mouth daily. 30 tablet 11  . Continuous Blood Gluc Receiver (FREESTYLE LIBRE 14 DAY READER) DEVI 1 each by Does not apply route See admin instructions. Use Freestyle Libre Reader to monitor blood sugars. 1 each 0  . Continuous Blood Gluc Sensor (FREESTYLE LIBRE  14 DAY SENSOR) MISC USE FREESTYLE LIBRE SENSOR TO MONITOR BLOOD SUGAR AS DIRECTED 2 each 2  . donepezil (ARICEPT) 5 MG tablet Take 1 tablet daily 90 tablet 3  . JANUMET XR [id number] MG TB24 TAKE 1 TABLET BY MOUTH DAILY 30 tablet 0  . latanoprost (XALATAN) 0.005 % ophthalmic solution Place 1 drop into the right eye daily.     Marland Kitchen LORazepam (ATIVAN) 0.5 MG tablet Take 0.5 mg by mouth every 8 (eight) hours. As needed    . memantine (NAMENDA) 10 MG tablet Take 1 tablet every night for 2 weeks, then increase to 1 tablet twice a day (Patient taking differently: 1 tablet twice  a day) 60 tablet 11  . VIAGRA 100 MG tablet TAKE ONE TABLET BY MOUTH AS NEEDED FOR ERECTILE DYSFUNCTION. 10 tablet 3   No current facility-administered medications for this visit.    Physical Exam: Vitals:   08/15/20 0913  BP: 124/76  Pulse: 75  SpO2: 93%  Weight: 177 lb (80.3 kg)  Height: 6\' 4"  (1.93 m)    GEN- The patient is well appearing, alert and oriented x 3 today.   Head- normocephalic, atraumatic Eyes-  Sclera clear, conjunctiva pink Ears- hearing intact Oropharynx- clear Lungs- Clear to ausculation bilaterally, normal work of breathing Chest- ICD pocket is well healed Heart- Regular rate and rhythm, no murmurs, rubs or gallops, PMI not laterally displaced GI- soft, NT, ND, + BS Extremities- no clubbing, cyanosis, or edema  ICD interrogation- reviewed in detail today,  See PACEART report  ekg tracing ordered today is personally reviewed and shows sinus with BiV pacing  Wt Readings from Last 3 Encounters:  08/15/20 177 lb (80.3 kg)  01/07/20 188 lb (85.3 kg)  10/17/19 184 lb (83.5 kg)    Assessment and Plan:  1.  Chronic systolic dysfunction/ nonischemic CM/ complete heart block euvolemic today Stable on an appropriate medical regimen Normal BiV ICD function but at RRT See Pace Art report No changes today he is device dependant today  followed in ICM device clinic Update echo Risks, benefits, and alternatives to BiV ICD pulse generator replacement were discussed in detail today.  The patient understands that risks include but are not limited to bleeding, infection, pneumothorax, perforation, tamponade, vascular damage, renal failure, MI, stroke, death, inappropriate shocks, damage to his existing leads, and lead dislodgement and wishes to proceed.  We will therefore schedule the procedure at the next available time.  2. HTN Stable No change required today   Risks, benefits and potential toxicities for medications prescribed and/or refilled reviewed with  patient today.   [name] MD, [facility] 08/15/2020 9:23 AM

## 2020-08-15 NOTE — H&P (View-Only) (Signed)
PCP: [name], MD Primary Cardiologist: Dr Sallyanne Kuster Primary EP: Dr Jeralyn Bennett is a 74 y.o. male who presents today for routine electrophysiology followup.  Since last being seen in our clinic, the patient reports doing very well.  Today, he denies symptoms of palpitations, chest pain, shortness of breath,  lower extremity edema, dizziness, presyncope, syncope, or ICD shocks.  The patient is otherwise without complaint today.   Past Medical History:  Diagnosis Date  . Agent orange exposure    in [place]  . Anemia   . Heart block AV second degree    a. s/p PPM implant with subsequent CRTD upgrade  . High cholesterol   . History of colon polyps   . Hypertension   . LBBB (left bundle branch block)   . Nonischemic cardiomyopathy ([place])    a. MDT CRTD upgrade 2016  . Pacemaker 08/27/2013   Dual-chamber Medtronic Adapta implanted January 2013 for bradycardia with alternating bundle branch block and 2:1 atrioventricular block   . Prostate cancer ([place])    "not been treated yet; on a wait and see" (01/22/2015)  . Type II diabetes mellitus ([place])    Past Surgical History:  Procedure Laterality Date  . BASAL CELL CARCINOMA EXCISION     "back, face, neck"  . BI-VENTRICULAR IMPLANTABLE CARDIOVERTER DEFIBRILLATOR UPGRADE N/A 01/22/2015   MDT CRTD upgrade by Dr Rayann Heman  . CARDIAC CATHETERIZATION  01/27/2012   normal coronaries, dilated LV c/w nonischemic cardiomyopathy  . EXAMINATION UNDER ANESTHESIA  04/06/2012   Procedure: EXAM UNDER ANESTHESIA;  Surgeon: Marcello Moores A. Cornett, MD;  Location: [facility];  Service: General;  Laterality: N/A;  . INCISION AND DRAINAGE PERIRECTAL ABSCESS  ~ 2010; 2015  . INGUINAL HERNIA REPAIR Left 1980  . LEFT HEART CATHETERIZATION WITH CORONARY ANGIOGRAM N/A 01/27/2012   Procedure: LEFT HEART CATHETERIZATION WITH CORONARY ANGIOGRAM;  Surgeon: Troy Sine, MD;  Location: [facility] CATH LAB;  Service: Cardiovascular;  Laterality: N/A;  . NM MYOCAR PERF  WALL MOTION  01/21/2012   mod-severe perfusion defect due to infarct/scar w/mild perinfarct ischemia in the apical,basal inferoseptal,basal inferior,mid inferoseptal,midinferior and apical inferior regions  . PERMANENT PACEMAKER INSERTION N/A 12/01/2011   Medtronic implanted by Dr Sallyanne Kuster  . PROSTATE BIOPSY    . PROSTATE BIOPSY    . shrapnel surgery     "in [place]"  . TONSILLECTOMY      ROS- all systems are reviewed and negative except as per HPI above  Current Outpatient Medications  Medication Sig Dispense Refill  . ACCU-CHEK COMPACT PLUS test strip use 1 TEST STRIP once daily as directed 102 each 5  . atorvastatin (LIPITOR) 80 MG tablet Take 40 mg by mouth daily at 6 PM.    . buPROPion (WELLBUTRIN XL) 150 MG 24 hr tablet Take 2 tablets daily 180 tablet 3  . carvedilol (COREG) 12.5 MG tablet Take 6.25 mg by mouth 2 (two) times daily with a meal.    . CHELATED MAGNESIUM PO Take 250 mg by mouth daily.    . clobetasol ointment (TEMOVATE) 9.32 % Apply 1 application topically daily as needed (itching).     . clopidogrel (PLAVIX) 75 MG tablet Take 1 tablet (75 mg total) by mouth daily. 30 tablet 11  . Continuous Blood Gluc Receiver (FREESTYLE LIBRE 14 DAY READER) DEVI 1 each by Does not apply route See admin instructions. Use Freestyle Libre Reader to monitor blood sugars. 1 each 0  . Continuous Blood Gluc Sensor (FREESTYLE LIBRE  14 DAY SENSOR) MISC USE FREESTYLE LIBRE SENSOR TO MONITOR BLOOD SUGAR AS DIRECTED 2 each 2  . donepezil (ARICEPT) 5 MG tablet Take 1 tablet daily 90 tablet 3  . JANUMET XR [id number] MG TB24 TAKE 1 TABLET BY MOUTH DAILY 30 tablet 0  . latanoprost (XALATAN) 0.005 % ophthalmic solution Place 1 drop into the right eye daily.     Marland Kitchen LORazepam (ATIVAN) 0.5 MG tablet Take 0.5 mg by mouth every 8 (eight) hours. As needed    . memantine (NAMENDA) 10 MG tablet Take 1 tablet every night for 2 weeks, then increase to 1 tablet twice a day (Patient taking differently: 1 tablet twice  a day) 60 tablet 11  . VIAGRA 100 MG tablet TAKE ONE TABLET BY MOUTH AS NEEDED FOR ERECTILE DYSFUNCTION. 10 tablet 3   No current facility-administered medications for this visit.    Physical Exam: Vitals:   08/15/20 0913  BP: 124/76  Pulse: 75  SpO2: 93%  Weight: 177 lb (80.3 kg)  Height: 6\' 4"  (1.93 m)    GEN- The patient is well appearing, alert and oriented x 3 today.   Head- normocephalic, atraumatic Eyes-  Sclera clear, conjunctiva pink Ears- hearing intact Oropharynx- clear Lungs- Clear to ausculation bilaterally, normal work of breathing Chest- ICD pocket is well healed Heart- Regular rate and rhythm, no murmurs, rubs or gallops, PMI not laterally displaced GI- soft, NT, ND, + BS Extremities- no clubbing, cyanosis, or edema  ICD interrogation- reviewed in detail today,  See PACEART report  ekg tracing ordered today is personally reviewed and shows sinus with BiV pacing  Wt Readings from Last 3 Encounters:  08/15/20 177 lb (80.3 kg)  01/07/20 188 lb (85.3 kg)  10/17/19 184 lb (83.5 kg)    Assessment and Plan:  1.  Chronic systolic dysfunction/ nonischemic CM/ complete heart block euvolemic today Stable on an appropriate medical regimen Normal BiV ICD function but at RRT See Pace Art report No changes today he is device dependant today  followed in ICM device clinic Update echo Risks, benefits, and alternatives to BiV ICD pulse generator replacement were discussed in detail today.  The patient understands that risks include but are not limited to bleeding, infection, pneumothorax, perforation, tamponade, vascular damage, renal failure, MI, stroke, death, inappropriate shocks, damage to his existing leads, and lead dislodgement and wishes to proceed.  We will therefore schedule the procedure at the next available time.  2. HTN Stable No change required today   Risks, benefits and potential toxicities for medications prescribed and/or refilled reviewed with  patient today.   [name] MD, [facility] 08/15/2020 9:23 AM

## 2020-08-19 ENCOUNTER — Ambulatory Visit (INDEPENDENT_AMBULATORY_CARE_PROVIDER_SITE_OTHER): Payer: Medicare HMO

## 2020-08-19 DIAGNOSIS — I441 Atrioventricular block, second degree: Secondary | ICD-10-CM

## 2020-08-22 ENCOUNTER — Other Ambulatory Visit: Payer: Self-pay | Admitting: Endocrinology

## 2020-08-22 DIAGNOSIS — R69 Illness, unspecified: Secondary | ICD-10-CM | POA: Diagnosis not present

## 2020-08-22 LAB — CUP PACEART REMOTE DEVICE CHECK
Battery Remaining Longevity: 1 mo — CL
Battery Voltage: 2.63 V
Brady Statistic AP VP Percent: 0.07 %
Brady Statistic AP VS Percent: 0.01 %
Brady Statistic AS VP Percent: 99.89 %
Brady Statistic AS VS Percent: 0.04 %
Brady Statistic RA Percent Paced: 0.08 %
Brady Statistic RV Percent Paced: 99.95 %
Date Time Interrogation Session: 20211014170806
HighPow Impedance: 80 Ohm
Implantable Lead Implant Date: 20130123
Implantable Lead Implant Date: 20160316
Implantable Lead Implant Date: 20160316
Implantable Lead Location: 753858
Implantable Lead Location: 753859
Implantable Lead Location: 753860
Implantable Lead Model: 4598
Implantable Lead Model: 5076
Implantable Lead Model: 6935
Implantable Pulse Generator Implant Date: 20160316
Lead Channel Impedance Value: 1083 Ohm
Lead Channel Impedance Value: 1140 Ohm
Lead Channel Impedance Value: 1197 Ohm
Lead Channel Impedance Value: 399 Ohm
Lead Channel Impedance Value: 437 Ohm
Lead Channel Impedance Value: 475 Ohm
Lead Channel Impedance Value: 513 Ohm
Lead Channel Impedance Value: 570 Ohm
Lead Channel Impedance Value: 589 Ohm
Lead Channel Impedance Value: 703 Ohm
Lead Channel Impedance Value: 760 Ohm
Lead Channel Impedance Value: 912 Ohm
Lead Channel Impedance Value: 912 Ohm
Lead Channel Pacing Threshold Amplitude: 0.5 V
Lead Channel Pacing Threshold Amplitude: 0.5 V
Lead Channel Pacing Threshold Amplitude: 3.5 V
Lead Channel Pacing Threshold Pulse Width: 0.4 ms
Lead Channel Pacing Threshold Pulse Width: 0.4 ms
Lead Channel Pacing Threshold Pulse Width: 1 ms
Lead Channel Sensing Intrinsic Amplitude: 3.25 mV
Lead Channel Sensing Intrinsic Amplitude: 3.25 mV
Lead Channel Sensing Intrinsic Amplitude: 8.375 mV
Lead Channel Sensing Intrinsic Amplitude: 8.375 mV
Lead Channel Setting Pacing Amplitude: 2 V
Lead Channel Setting Pacing Amplitude: 2.5 V
Lead Channel Setting Pacing Amplitude: 4 V
Lead Channel Setting Pacing Pulse Width: 0.4 ms
Lead Channel Setting Pacing Pulse Width: 1 ms
Lead Channel Setting Sensing Sensitivity: 0.3 mV

## 2020-08-24 MED ORDER — FREESTYLE LIBRE 14 DAY SENSOR MISC: 2 refills | Status: DC

## 2020-08-24 MED ORDER — FREESTYLE LIBRE 14 DAY READER DEVI: 1.0000 | 0 refills | Status: DC

## 2020-08-25 NOTE — Progress Notes (Signed)
Remote ICD transmission.   

## 2020-08-27 ENCOUNTER — Other Ambulatory Visit: Payer: Self-pay

## 2020-08-27 ENCOUNTER — Ambulatory Visit (HOSPITAL_BASED_OUTPATIENT_CLINIC_OR_DEPARTMENT_OTHER): Payer: Medicare HMO

## 2020-08-27 ENCOUNTER — Other Ambulatory Visit (HOSPITAL_COMMUNITY)
Admission: RE | Admit: 2020-08-27 | Discharge: 2020-08-27 | Disposition: A | Discharge status: Home / Self Care | Payer: Medicare HMO | Source: Ambulatory Visit | Attending: Internal Medicine | Admitting: Internal Medicine

## 2020-08-27 DIAGNOSIS — Z95810 Presence of automatic (implantable) cardiac defibrillator: Secondary | ICD-10-CM | POA: Insufficient documentation

## 2020-08-27 DIAGNOSIS — E119 Type 2 diabetes mellitus without complications: Secondary | ICD-10-CM | POA: Diagnosis not present

## 2020-08-27 DIAGNOSIS — I5042 Chronic combined systolic (congestive) and diastolic (congestive) heart failure: Secondary | ICD-10-CM | POA: Insufficient documentation

## 2020-08-27 DIAGNOSIS — E785 Hyperlipidemia, unspecified: Secondary | ICD-10-CM | POA: Insufficient documentation

## 2020-08-27 DIAGNOSIS — I428 Other cardiomyopathies: Secondary | ICD-10-CM

## 2020-08-27 DIAGNOSIS — Z20822 Contact with and (suspected) exposure to covid-19: Secondary | ICD-10-CM | POA: Insufficient documentation

## 2020-08-27 DIAGNOSIS — I447 Left bundle-branch block, unspecified: Secondary | ICD-10-CM | POA: Insufficient documentation

## 2020-08-27 DIAGNOSIS — I110 Hypertensive heart disease with heart failure: Secondary | ICD-10-CM | POA: Insufficient documentation

## 2020-08-27 DIAGNOSIS — I442 Atrioventricular block, complete: Secondary | ICD-10-CM

## 2020-08-27 LAB — ECHOCARDIOGRAM COMPLETE
Area-P 1/2: 3.53 cm2
S' Lateral: 3.7 cm

## 2020-08-27 LAB — SARS CORONAVIRUS 2 (TAT 6-24 HRS): SARS Coronavirus 2: NEGATIVE

## 2020-08-27 NOTE — Progress Notes (Signed)
Instructed patient on the following items: Arrival time 1130 Nothing to eat or drink after midnight No meds AM of procedure Responsible person to drive you home and stay with you for 24 hrs Wash with special soap night before and morning of procedure  

## 2020-08-28 ENCOUNTER — Encounter (HOSPITAL_COMMUNITY)
Admission: RE | Disposition: A | Discharge status: Home / Self Care | Payer: Self-pay | Source: Home / Self Care | Attending: Internal Medicine

## 2020-08-28 ENCOUNTER — Ambulatory Visit (HOSPITAL_COMMUNITY)
Admission: RE | Admit: 2020-08-28 | Discharge: 2020-08-28 | Disposition: A | Discharge status: Home / Self Care | Payer: Medicare HMO | Attending: Internal Medicine | Admitting: Internal Medicine

## 2020-08-28 DIAGNOSIS — I509 Heart failure, unspecified: Secondary | ICD-10-CM | POA: Diagnosis not present

## 2020-08-28 DIAGNOSIS — Z79899 Other long term (current) drug therapy: Secondary | ICD-10-CM | POA: Diagnosis not present

## 2020-08-28 DIAGNOSIS — I442 Atrioventricular block, complete: Secondary | ICD-10-CM | POA: Insufficient documentation

## 2020-08-28 DIAGNOSIS — Z8546 Personal history of malignant neoplasm of prostate: Secondary | ICD-10-CM | POA: Diagnosis not present

## 2020-08-28 DIAGNOSIS — Z4502 Encounter for adjustment and management of automatic implantable cardiac defibrillator: Secondary | ICD-10-CM | POA: Insufficient documentation

## 2020-08-28 DIAGNOSIS — E7800 Pure hypercholesterolemia, unspecified: Secondary | ICD-10-CM | POA: Diagnosis not present

## 2020-08-28 DIAGNOSIS — I110 Hypertensive heart disease with heart failure: Secondary | ICD-10-CM | POA: Insufficient documentation

## 2020-08-28 DIAGNOSIS — Z7984 Long term (current) use of oral hypoglycemic drugs: Secondary | ICD-10-CM | POA: Insufficient documentation

## 2020-08-28 DIAGNOSIS — E119 Type 2 diabetes mellitus without complications: Secondary | ICD-10-CM | POA: Diagnosis not present

## 2020-08-28 DIAGNOSIS — Z7902 Long term (current) use of antithrombotics/antiplatelets: Secondary | ICD-10-CM | POA: Insufficient documentation

## 2020-08-28 DIAGNOSIS — I428 Other cardiomyopathies: Secondary | ICD-10-CM | POA: Insufficient documentation

## 2020-08-28 HISTORY — PX: BIV ICD GENERATOR CHANGEOUT: EP1194

## 2020-08-28 LAB — GLUCOSE, CAPILLARY: Glucose-Capillary: 109 mg/dL — ABNORMAL HIGH (ref 70–99)

## 2020-08-28 SURGERY — BIV ICD GENERATOR CHANGEOUT

## 2020-08-28 MED ORDER — CEFAZOLIN SODIUM-DEXTROSE 2-4 GM/100ML-% IV SOLN
2.0000 g | INTRAVENOUS | Status: AC
  Administered 2020-08-28: 2 g via INTRAVENOUS

## 2020-08-28 MED ORDER — LIDOCAINE HCL (PF) 1 % IJ SOLN
INTRAMUSCULAR | Status: AC
  Filled 2020-08-28: qty 60

## 2020-08-28 MED ORDER — SODIUM CHLORIDE 0.9 % IV SOLN: 250.0000 mL | INTRAVENOUS | Status: DC | PRN

## 2020-08-28 MED ORDER — SODIUM CHLORIDE 0.9% FLUSH: 3.0000 mL | Freq: Two times a day (BID) | INTRAVENOUS | Status: DC

## 2020-08-28 MED ORDER — CEFAZOLIN SODIUM-DEXTROSE 2-4 GM/100ML-% IV SOLN
INTRAVENOUS | Status: AC
  Filled 2020-08-28: qty 100

## 2020-08-28 MED ORDER — SODIUM CHLORIDE 0.9 % IV SOLN
80.0000 mg | INTRAVENOUS | Status: AC
  Administered 2020-08-28: 80 mg

## 2020-08-28 MED ORDER — ACETAMINOPHEN 325 MG PO TABS: 325.0000 mg | ORAL_TABLET | ORAL | Status: DC | PRN

## 2020-08-28 MED ORDER — SODIUM CHLORIDE 0.9 % IV SOLN: INTRAVENOUS | Status: DC

## 2020-08-28 MED ORDER — ONDANSETRON HCL 4 MG/2ML IJ SOLN: 4.0000 mg | Freq: Four times a day (QID) | INTRAMUSCULAR | Status: DC | PRN

## 2020-08-28 MED ORDER — SODIUM CHLORIDE 0.9% FLUSH: 3.0000 mL | INTRAVENOUS | Status: DC | PRN

## 2020-08-28 MED ORDER — LIDOCAINE HCL (PF) 1 % IJ SOLN
INTRAMUSCULAR | Status: DC | PRN
  Administered 2020-08-28: 60 mL

## 2020-08-28 MED ORDER — SODIUM CHLORIDE 0.9 % IV SOLN
INTRAVENOUS | Status: AC
  Filled 2020-08-28: qty 2

## 2020-08-28 SURGICAL SUPPLY — 6 items
CABLE SURGICAL S-101-97-12 (CABLE) ×2 IMPLANT
ICD CLARIA MRI DTMA1Q1 (ICD Generator) ×1 IMPLANT
PAD PRO RADIOLUCENT 2001M-C (PAD) ×2 IMPLANT
POUCH AIGIS-R ANTIBACT ICD (Mesh General) ×2 IMPLANT
POUCH AIGIS-R ANTIBACT ICD LRG (Mesh General) IMPLANT
TRAY PACEMAKER INSERTION (PACKS) ×2 IMPLANT

## 2020-08-28 NOTE — Discharge Instructions (Signed)

## 2020-08-28 NOTE — Interval H&P Note (Signed)
History and Physical Interval Note:  08/28/2020 1:46 PM  Kevin [name]  has presented today for surgery, with the diagnosis of ERI.  The various methods of treatment have been discussed with the patient and family. After consideration of risks, benefits and other options for treatment, the patient has consented to  Procedure(s): BIV ICD Hasty (N/A) as a surgical intervention.  The patient's history has been reviewed, patient examined, no change in status, stable for surgery.  I have reviewed the patient's chart and labs.  Questions were answered to the patient's satisfaction.     ICD Criteria  Current LVEF:45%. Within 12 months prior to implant: Yes   Heart failure history: Yes, Class III  Cardiomyopathy history: Yes, Non-Ischemic Cardiomyopathy.  Atrial Fibrillation/Atrial Flutter: No.  Ventricular tachycardia history: No.  Cardiac arrest history: No.  History of syndromes with risk of sudden death: No.  Previous ICD: Yes, Reason for ICD:  Primary prevention.  Current ICD indication: Primary  PPM indication: Yes, complete heart block  Class I or II Bradycardia indication present: Yes  Beta Blocker therapy for 3 or more months: Yes, prescribed.   Ace Inhibitor/ARB therapy for 3 or more months: No, medical reason. Per Dr Uvaldo Bristle note 2018 "Intolerant of ARB due to orthostatic hypotension,  for the same reason I do not think he would tolerate Entresto".   I have seen Kevin [name] is a 74 y.o. male who presents for BiV ICD generator change. The patient's chart has been reviewed and they meet criteria for BiV ICD generator change.  I have had a thorough discussion with the patient reviewing options.  The patient has had opportunities to ask questions and have them answered. The patient and I have decided together through the [place] Decision Support Tool to proceed with BiV ICD generator change at this time.  Risks, benefits, alternatives to  BiV ICD generator change were discussed in detail with the patient today. The patient  understands that the risks include but are not limited to bleeding, infection, pneumothorax, perforation, tamponade, vascular damage, renal failure, MI, stroke, death, inappropriate shocks, and lead dislodgement and wishes to proceed.    [name]

## 2020-08-29 ENCOUNTER — Encounter (HOSPITAL_COMMUNITY): Payer: Self-pay | Admitting: Internal Medicine

## 2020-08-29 ENCOUNTER — Telehealth: Payer: Self-pay | Admitting: Internal Medicine

## 2020-08-29 NOTE — Telephone Encounter (Signed)
Patient states he had the batteries in his pacemaker changed yesterday and last night his stomach was "going up and down". He states it also hurt, but is not hurting as bad now.

## 2020-08-29 NOTE — Telephone Encounter (Signed)
Patient called in and is c/o palpitations in his stomach that you can see

## 2020-08-29 NOTE — Telephone Encounter (Signed)
Spoke with pt and his spouse.  Pt feels fine otherwise.  Denies any chest pain, SOB, or dizziness.    It sounds like he is having PNS.  Pulsation improves when pt changes position.  Explained that issue is not emergency as long as it is tolerable. Looked for opening in schedule to bring patient in sooner for reprogramming.  Patient and spouse v/u to contact office if issue becomes bothersome or there is a change in condition.

## 2020-09-02 ENCOUNTER — Other Ambulatory Visit: Payer: Self-pay

## 2020-09-02 ENCOUNTER — Ambulatory Visit (INDEPENDENT_AMBULATORY_CARE_PROVIDER_SITE_OTHER): Payer: Medicare HMO | Admitting: Student

## 2020-09-02 DIAGNOSIS — I442 Atrioventricular block, complete: Secondary | ICD-10-CM | POA: Diagnosis not present

## 2020-09-02 DIAGNOSIS — I428 Other cardiomyopathies: Secondary | ICD-10-CM

## 2020-09-02 LAB — CUP PACEART INCLINIC DEVICE CHECK
Date Time Interrogation Session: 20211026111726
Implantable Lead Implant Date: 20130123
Implantable Lead Implant Date: 20160316
Implantable Lead Implant Date: 20160316
Implantable Lead Location: 753858
Implantable Lead Location: 753859
Implantable Lead Location: 753860
Implantable Lead Model: 4598
Implantable Lead Model: 5076
Implantable Lead Model: 6935
Implantable Pulse Generator Implant Date: 20211021

## 2020-09-02 NOTE — Progress Notes (Signed)
Add on appointment for PNS. Wound check scheduled for next week. Steri-strips in tact.  Mild, late stage ecchymosis  that is resolving. Wound without redness or edema. Normal device function. Pt with stim with current programming at [facility]. Vector express showed vectors involving L1 had best battery life. Vectors involving M3 all causes stim.   Pt programmed at L1 -> L4 with threshold of 2.25V @ 1.0 ms, but best battery life due to impedance.  EKG with Upright V1 and negative lead 1 with QRS of 170 (best QRS has chronically been ~160 ms)  Next best option was L4 -> RV coil with a threshold of 1.5V @ 1.0 ms, Upright V1, Lead 1 biphasic with initial downward deflection, but mostly positive.   Of note, Chronic leads were left at acute settings post op. These were adjusted to chronic settings today with appropriate thresholds in RA and RV.  LV set to 3.0V at 1.0 ms for 0.75V safety margin.  Performed along with industry in clinic. See attached.  Keep wound check next week. Site stable. Re-reviewed wound care with patient. Continue to keep clean and dry.

## 2020-09-02 NOTE — Patient Instructions (Addendum)
Medication Instructions:  *If you need a refill on your cardiac medications before your next appointment, please call your pharmacy*  Follow-Up: At [facility], you and your health needs are our priority.  As part of our continuing mission to provide you with exceptional heart care, we have created designated Provider Care Teams.  These Care Teams include your primary Cardiologist (physician) and Advanced Practice Providers (APPs -  Physician Assistants and Nurse Practitioners) who all work together to provide you with the care you need, when you need it.  We recommend signing up for the patient portal called "MyChart".  Sign up information is provided on this After Visit Summary.  MyChart is used to connect with patients for Virtual Visits (Telemedicine).  Patients are able to view lab/test results, encounter notes, upcoming appointments, etc.  Non-urgent messages can be sent to your provider as well.   To learn more about what you can do with MyChart, go to [email or web address].    Your next appointment:   Your physician recommends that you keep your scheduled follow-up appointment on: 09/09/20 at 9:20 am with the [facility].   The format for your next appointment:   In Person with [facility]

## 2020-09-09 ENCOUNTER — Other Ambulatory Visit: Payer: Self-pay

## 2020-09-09 ENCOUNTER — Ambulatory Visit (INDEPENDENT_AMBULATORY_CARE_PROVIDER_SITE_OTHER): Payer: Medicare HMO | Admitting: Emergency Medicine

## 2020-09-09 DIAGNOSIS — I472 Ventricular tachycardia, unspecified: Secondary | ICD-10-CM

## 2020-09-09 DIAGNOSIS — I428 Other cardiomyopathies: Secondary | ICD-10-CM

## 2020-09-09 DIAGNOSIS — I495 Sick sinus syndrome: Secondary | ICD-10-CM

## 2020-09-09 LAB — CUP PACEART INCLINIC DEVICE CHECK
Battery Remaining Longevity: 81 mo
Battery Voltage: 3.07 V
Brady Statistic AP VP Percent: 0.04 %
Brady Statistic AP VS Percent: 0.01 %
Brady Statistic AS VP Percent: 99.94 %
Brady Statistic AS VS Percent: 0.01 %
Brady Statistic RA Percent Paced: 0.05 %
Brady Statistic RV Percent Paced: 99.95 %
Date Time Interrogation Session: 20211102094110
HighPow Impedance: 73 Ohm
Implantable Lead Implant Date: 20130123
Implantable Lead Implant Date: 20160316
Implantable Lead Implant Date: 20160316
Implantable Lead Location: 753858
Implantable Lead Location: 753859
Implantable Lead Location: 753860
Implantable Lead Model: 4598
Implantable Lead Model: 5076
Implantable Lead Model: 6935
Implantable Pulse Generator Implant Date: 20211021
Lead Channel Impedance Value: 1197 Ohm
Lead Channel Impedance Value: 1235 Ohm
Lead Channel Impedance Value: 1235 Ohm
Lead Channel Impedance Value: 262.946
Lead Channel Impedance Value: 262.946
Lead Channel Impedance Value: 292.308
Lead Channel Impedance Value: 331.831
Lead Channel Impedance Value: 331.831
Lead Channel Impedance Value: 418 Ohm
Lead Channel Impedance Value: 456 Ohm
Lead Channel Impedance Value: 475 Ohm
Lead Channel Impedance Value: 513 Ohm
Lead Channel Impedance Value: 589 Ohm
Lead Channel Impedance Value: 589 Ohm
Lead Channel Impedance Value: 703 Ohm
Lead Channel Impedance Value: 760 Ohm
Lead Channel Impedance Value: 950 Ohm
Lead Channel Impedance Value: 988 Ohm
Lead Channel Pacing Threshold Amplitude: 0.5 V
Lead Channel Pacing Threshold Amplitude: 0.75 V
Lead Channel Pacing Threshold Amplitude: 2 V
Lead Channel Pacing Threshold Pulse Width: 0.4 ms
Lead Channel Pacing Threshold Pulse Width: 0.4 ms
Lead Channel Pacing Threshold Pulse Width: 1 ms
Lead Channel Sensing Intrinsic Amplitude: 2.375 mV
Lead Channel Setting Pacing Amplitude: 1.5 V
Lead Channel Setting Pacing Amplitude: 2.5 V
Lead Channel Setting Pacing Amplitude: 2.75 V
Lead Channel Setting Pacing Pulse Width: 0.4 ms
Lead Channel Setting Pacing Pulse Width: 1 ms
Lead Channel Setting Sensing Sensitivity: 0.3 mV

## 2020-09-09 NOTE — Patient Instructions (Signed)
Call if you develop any swelling, drainage, redness at wound site or a fever or chills. Keep steri-strips clean and dry and follow-up for wound check 09/18/20. Device Clinic: [phone]

## 2020-09-09 NOTE — Progress Notes (Signed)
Wound check appointment. Steri-strips removed. Wound without redness or edema. Incision edges approximated with exception of 1 cm area at left outer edge of incision,steri-strips applied tand scheduled for wound check on 09/18/20 . Normal device function. Thresholds, sensing, and impedances consistent with implant measurements. Device programmed at chronic settings due to mature leads. Histogram distribution appropriate for patient and level of activity. No mode switches or ventricular arrhythmias noted. Patient educated about wound care, arm mobility, lifting restrictions, shock plan. ROV 12/04/19 with Dr Rayann Heman. Enrolled in remote follow-up and next remote scheduled for 12/03/19.

## 2020-09-12 ENCOUNTER — Ambulatory Visit: Payer: Medicare HMO

## 2020-09-12 DIAGNOSIS — E119 Type 2 diabetes mellitus without complications: Secondary | ICD-10-CM

## 2020-09-12 DIAGNOSIS — I10 Essential (primary) hypertension: Secondary | ICD-10-CM

## 2020-09-12 NOTE — Chronic Care Management (AMB) (Signed)
Chronic Care Management Pharmacy  Name: Kevin [name]  MRN: [id number] DOB: [birth year]  Initial Questions: 1. Have you seen any other providers since your last visit? Yes  2. Any changes in your medicines or health? No   Chief Complaint/ HPI  Kevin [name],  74 y.o. , male presents for their Follow-Up CCM visit with the clinical pharmacist via telephone due to COVID-19 Pandemic.  PCP : [name], MD  Their chronic conditions include: DM, HF, HTN, intracranial stenosis, HLD, anxiety, mild cognitive impairment  Office Visits: 06/04/20 Charlott Rakes, LPN: Patient presented for medicare annual wellness visit.  06/14/2019- Carolann Littler, MD- Patient presented for virtual visit for diarrhea. Suspect COVID-19 infection.Patient to obtain test and quarantine, drinking plenty of fluids and rest.  06/06/2019- Carolann Littler, MD- Patient presented for office visit for nonspecific lightheadedness. No orthostatic drop. BP seated: 130/74 and standing: 136/78. Labs to be checked: CBC, TSH. Patient to change positions slowly.   Consult Visit: 09/09/20 Barrington Ellison, RN (cardiology): Patient presented for wound check appointment. Follow up scheduled for Jan 2022.  09/02/20 Jodi Geralds, RN (cardiology): Patient presented for wound check appointment. Follow up in one week.  08/28/20 Patient admitted for ICD generator change out procedure.  08/15/20 [name], MD (cardiology): Patient presented for follow up for heart block and ischemic cardiomyopathy. Labs completed and glucose was elevated and MCV was elevated. No medication changes made.   01/07/2020- Cardiology- [name], MD- patient presented for virtual visit for follow up. 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 Patient to follow up in 1 year.   12/03/2019- Endocrinology- Elayne Snare, MD- Patient presented for virtual visit for DM. Patient reported obtaining medications from [place].  Janumet 100/1000mg  to be continued. Patient to bring in monitor for comparison. SGLT-2 to be considered with patient's cardiac history. To be discussed at next office visit. Patient to return in 4 months.   10/18/2019- Neurology- Ellouise Newer, MD- patient presented for virtual visit for follow up. Patient to have repeat head CT at [facility] for possible stroke. Patient knows to go to ER immediately for symptoms changes. Discussed increasing Wellbutrin 150mg  to 2 tablets once daily to help with mood/ increased frustration. Patient to follow up in 6 months.   Medications: Outpatient Encounter Medications as of 09/12/2020  Medication Sig  . ACCU-CHEK COMPACT PLUS test strip use 1 TEST STRIP once daily as directed  . aspirin EC 81 MG tablet Take 81 mg by mouth daily. Swallow whole.  Marland Kitchen atorvastatin (LIPITOR) 80 MG tablet Take 40 mg by mouth daily at 6 PM.  . buPROPion (WELLBUTRIN XL) 150 MG 24 hr tablet Take 2 tablets daily (Patient taking differently: Take 300 mg by mouth daily. )  . busPIRone (BUSPAR) 5 MG tablet Take 5 mg by mouth daily.  . carvedilol (COREG) 12.5 MG tablet Take 6.25 mg by mouth 2 (two) times daily with a meal.  . CHELATED MAGNESIUM PO Take 250 mg by mouth daily.  . clobetasol ointment (TEMOVATE) 7.61 % Apply 1 application topically daily as needed (itching).   . Continuous Blood Gluc Receiver (FREESTYLE LIBRE 14 DAY READER) DEVI 1 each by Does not apply route See admin instructions. Use Freestyle Libre Reader to monitor blood sugars.  . Continuous Blood Gluc Sensor (FREESTYLE LIBRE 14 DAY SENSOR) MISC Change every 14 days as directed or needed  . LORazepam (ATIVAN) 0.5 MG tablet Take 0.5 mg by mouth every 8 (eight) hours as needed for anxiety.   Marland Kitchen  memantine (NAMENDA) 10 MG tablet Take 1 tablet every night for 2 weeks, then increase to 1 tablet twice a day (Patient taking differently: Take 10 mg by mouth 2 (two) times daily. )  . VIAGRA 100 MG tablet TAKE ONE TABLET BY MOUTH AS NEEDED FOR  ERECTILE DYSFUNCTION. (Patient taking differently: Take 50 mg by mouth as needed for erectile dysfunction. )  . [DISCONTINUED] clopidogrel (PLAVIX) 75 MG tablet Take 1 tablet (75 mg total) by mouth daily.  . [DISCONTINUED] donepezil (ARICEPT) 5 MG tablet Take 1 tablet daily (Patient taking differently: Take 5 mg by mouth daily. )  . [DISCONTINUED] JANUMET XR 442 [id number] MG TB24 TAKE 1 TABLET BY MOUTH DAILY (Patient taking differently: Take 0.5 tablets by mouth in the morning and at bedtime. )   No facility-administered encounter medications on file as of 09/12/2020.     Current Diagnosis/Assessment:  Goals Addressed            This Visit's Progress   . Pharmacy Care Plan       CARE PLAN ENTRY  Current Barriers:  . Chronic Disease Management support, education, and care coordination needs related to Hypertension, Hyperlipidemia, Diabetes, Heart Failure, Anxiety, and Intracranial stenosis, mild cognitive impairment    Hypertension . Pharmacist Clinical Goal(s): o Over the next 180 days, patient will work with PharmD and providers to maintain BP goal <130/80 . Current regimen:  o Carvedilol 12.5mg , 0.5 tablet twice daily   . Patient self care activities - Over the next 180 days, patient will: o Check BP at home, document, and provide at future appointments  Hyperlipidemia . Pharmacist Clinical Goal(s): o Over the next 180 days, patient will work with PharmD and providers to maintain LDL goal < 70 . Current regimen:  o Atorvastatin 80mg , 40mg  daily at 6 pm  . Patient self care activities - Over the next 180 days, patient will: o Continue current medication.  o Make an appointment with Dr. Elease Hashimoto for annual exam.   Diabetes . Pharmacist Clinical Goal(s): o Over the next 180 days, patient will work with PharmD and providers to maintain A1c goal <7% . Current regimen:  o Janumet XR 100-1000mg , 1 tablet daily  . Patient self care activities - Over the next 180 days, patient  will: o Check blood sugar using Freestyle Libre, document, and provide at future appointments  Heart failure  . Pharmacist Clinical Goal(s) o Over the next 180 days, patient will work with PharmD and providers to  . Current regimen:  o Carvedilol 12.5mg , 0.5 tablet twice daily   . Patient self care activities - Over the next 180 days, patient will: o Continue current medication and report shortness of breath/swelling to providers.  Intracranial stenosis  . Pharmacist Clinical Goal(s) o Over the next 180 days, patient will work with PharmD and providers to continue current medications to decrease stroke event.  . Current regimen:   Clopidogrel 75mg , 1 tablet once daily  ASA 81mg , 1 tablet daily . Patient self care activities o Patient will discuss with Dr. Delice Lesch about plan for blood thinners.  Anxiety . Pharmacist Clinical Goal(s) o Over the next 180 days, patient will work with PharmD and providers to continue current medications to control mood.  . Current regimen:   Bupropion (Wellbutrin XL) 150mg , 2 tablets once daily   Lorazepam 0.5mg , 1 tablet every eight hours as needed  . Patient self care activities o Patient will continue current medications.   Mild cognitive impairment . Pharmacist Clinical Goal(s) o  Over the next 180 days, patient will work with PharmD and providers to continue current medications to prevent worsening of memory.  . Current regimen:   Donepezil 5mg , 1 tablet once daily  memantine (Namenda) 10mg , 1 tablet twice a day . Patient self care activities o Patient will continue current medications and follow up visit with Dr. Ellouise Newer  (neurology).     Medication management . Pharmacist Clinical Goal(s): o Over the next 180 days, patient will work with PharmD and providers to maintain optimal medication adherence . Current pharmacy: [facility] and [place]  . Interventions o Comprehensive medication review performed. o Continue current medication  management strategy . Patient self care activities - Over the next 180 days, patient will: o Take medications as prescribed o Report any questions or concerns to PharmD and/or provider(s)  Please see past updates related to this goal by clicking on the "Past Updates" button in the selected goal        SDOH Interventions     Most Recent Value  SDOH Interventions  Financial Strain Interventions Intervention Not Indicated  Transportation Interventions Intervention Not Indicated      Diabetes    Recent Relevant Labs: Lab Results  Component Value Date/Time   HGBA1C 6.1 (A) 09/24/2020 02:20 PM   HGBA1C 6.5 11/30/2019 11:58 AM   HGBA1C 6.7 (H) 07/20/2019 08:48 AM   MICROALBUR 2.5 (H) 09/24/2020 02:27 PM   MICROALBUR 2.3 (H) 07/20/2019 08:48 AM    Checking BG: Daily  Freestyle is covered by insurance  Recent FBG Readings: 121  Patient has failed these meds in past: glipizide  Patient is currently controlled on the following medications:   Janumet XR 100-1000mg , 1 tablet daily   We discussed: diet and exercise and signs of hypoglycemia -Denies symptoms of hypoglycemia but does get dizzy every once in a while - recommended checking blood sugars when dizzy -Exercise: walks 6 miles a day (walks at [facility])  Plan Continue current medications   Heart Failure    Type: Combined Systolic and Diastolic  Last ejection fraction: 40% (08/07/2013)    NYHA Class: I (no actitivty limitation)  Patient has failed these meds in past: lisinopril, losartan, spironolactone   Patient is currently controlled on the following medications:  Carvedilol 12.5mg , 0.5 tablet twice daily    Plan Continue current medications   Hypertension  Denies current/persistent headaches, dizziness, lightheadedness.  Spouse reports couple of times in the past.   Office blood pressures are  BP Readings from Last 3 Encounters:  09/24/20 126/82  08/28/20 (!) 179/83  08/15/20 124/76   Patient  has failed these meds in the past:   Patient checks BP at home daily Wife checks blood pressure 121/72  Patient home BP readings are ranging: 121/72  Patient is controlled on:   Carvedilol 12.5mg , 0.5 tablet twice daily    We discussed:  -DASH eating plan recommendations: . Emphasizes vegetables, fruits, and whole-grains . Includes fat-free or low-fat dairy products, fish, poultry, beans, nuts, and vegetable oils . Limits foods that are high in saturated fat. These foods include fatty meats, full-fat dairy products, and tropical oils such as coconut, palm kernel, and palm oils. . Limits sugar-sweetened beverages and sweets . Limiting sodium intake to < 1500 mg/day -Diet: patient does use a salt substitute for less sodium  Plan Continue current medications   Intracranial stenosis   Per last encounter with Dr. Delice Lesch, patient to continue DAPT for 3 months and then Plavix after.  Denies bruising.  Patient is currently controlled on the following medications:   Clopidogrel 75mg , 1 tablet once daily  ASA 81mg , 1 tablet daily   We discussed: Monitoring for signs of bleeding such as unexplained and excessive bleeding from a cut or injury, easy or excessive bruising, blood in urine or stools, and nosebleeds without a known cause; recommended for patient to discuss plan with Dr. Delice Lesch   Plan Continue current medications   Hyperlipidemia  LDL goal < 70  Lipid Panel     Component Value Date/Time   CHOL 103 09/24/2020 1427   TRIG 126.0 09/24/2020 1427   HDL 39.10 09/24/2020 1427   CHOLHDL 3 09/24/2020 1427   VLDL 25.2 09/24/2020 1427   LDLCALC 39 09/24/2020 1427   LDLDIRECT 58.6 09/24/2014 0824     The ASCVD Risk score (Goff DC Jr., et al., 2013) failed to calculate for the following reasons:   The patient has a prior MI or stroke diagnosis   Patient has failed these meds in past: none Patient is currently controlled on the following medications:   Atorvastatin  80mg , 40mg  daily at 6 pm (0.5 tablet in the evening)   Plan Continue current medications  Patient is overdue for lipid panel - recommended for patient to make annual appt with Dr. Elease Hashimoto.   Anxiety  Patient obtains through [place].   Patient is currently controlled on the following medications:   Bupropion (Wellbutrin XL) 150mg , 2 tablets once daily   Lorazepam 0.5mg , 1 tablet every eight hours as needed   Plan Continue current medications   Mild cognitive impairment    Patient is currently  on the following medications:   Donepezil 5mg , 1 tablet once daily  memantine (Namenda) 10mg , 1 tablet twice a day  Plan Managed by Neurology Ellouise Newer, MD)  Continue current medications   OTC/ supplements    Chelated magnesium 250mg , 1 tablet once daily   Clobetasol ointment 0.05%, apply as needed for itching  Vaccines   Reviewed and discussed patient's vaccination history.    Immunization History  Administered Date(s) Administered  . DTaP 11/15/2008  . Influenza Nasal 08/25/2020  . Influenza Split 09/20/2012  . Influenza, High Dose Seasonal PF 07/20/2019  . Influenza,inj,Quad PF,6+ Mos 08/23/2013, 08/29/2014  . Influenza-Unspecified 08/13/2015, 07/03/2016, 08/02/2018  . Pneumococcal-Unspecified 08/08/2014  . Td 11/15/2008   Patient reported getting influenza vaccine and all 3 COVID shots (including booster).   Plan  Recommended patient receive Shingles vaccine at pharmacy.   Medication Management   Patient's preferred pharmacy is:  [id number] [address], [place] - [address] [place] [address] Phone: [phone] Fax: [phone]  [facility] [address], [place] [address] [place] [address] Phone: [phone] Fax: [phone]  Uses pill box? No - patient's wife organizes medications  We discussed: Current pharmacy is  preferred with insurance plan and patient is satisfied with pharmacy services   Follow up Follow up visit with PharmD in 6 months. BP and DM assessment in 3 months with CPA.  Jeni Salles, PharmD Clinical Pharmacist [facility]

## 2020-09-14 ENCOUNTER — Other Ambulatory Visit: Payer: Self-pay | Admitting: Neurology

## 2020-09-17 ENCOUNTER — Ambulatory Visit: Payer: Medicare HMO | Admitting: Family Medicine

## 2020-09-18 ENCOUNTER — Other Ambulatory Visit: Payer: Self-pay

## 2020-09-18 ENCOUNTER — Ambulatory Visit (INDEPENDENT_AMBULATORY_CARE_PROVIDER_SITE_OTHER): Payer: Medicare HMO | Admitting: Emergency Medicine

## 2020-09-18 DIAGNOSIS — I495 Sick sinus syndrome: Secondary | ICD-10-CM

## 2020-09-18 NOTE — Progress Notes (Signed)
Patient in office for wound recheck.  6 steri strips removed from lateral aspect of incision.  approx 1 cm scab present, otherwise intect skin.  Patient educated to wash area with soap and water only, do not scrub, allow scab to fall off naturally.

## 2020-09-19 NOTE — Progress Notes (Signed)
ICM remote transmission scheduled 10/13/2020 to allow new device to develop fluid levels baseline.

## 2020-09-21 ENCOUNTER — Other Ambulatory Visit: Payer: Self-pay | Admitting: Endocrinology

## 2020-09-24 ENCOUNTER — Ambulatory Visit (INDEPENDENT_AMBULATORY_CARE_PROVIDER_SITE_OTHER): Payer: Medicare HMO | Admitting: Endocrinology

## 2020-09-24 ENCOUNTER — Other Ambulatory Visit: Payer: Self-pay

## 2020-09-24 VITALS — BP 126/82 | HR 70 | Ht 76.5 in | Wt 176.8 lb

## 2020-09-24 DIAGNOSIS — E7800 Pure hypercholesterolemia, unspecified: Secondary | ICD-10-CM | POA: Diagnosis not present

## 2020-09-24 DIAGNOSIS — E1165 Type 2 diabetes mellitus with hyperglycemia: Secondary | ICD-10-CM

## 2020-09-24 LAB — POCT GLYCOSYLATED HEMOGLOBIN (HGB A1C): Hemoglobin A1C: 6.1 % — AB (ref 4.0–5.6)

## 2020-09-24 LAB — MICROALBUMIN / CREATININE URINE RATIO
Creatinine,U: 110.8 mg/dL
Microalb Creat Ratio: 2.3 mg/g (ref 0.0–30.0)
Microalb, Ur: 2.5 mg/dL — ABNORMAL HIGH (ref 0.0–1.9)

## 2020-09-24 LAB — LIPID PANEL
Cholesterol: 103 mg/dL (ref 0–200)
HDL: 39.1 mg/dL (ref >39.00)
LDL Cholesterol: 39 mg/dL (ref 0–99)
NonHDL: 63.7
Total CHOL/HDL Ratio: 3
Triglycerides: 126 mg/dL (ref 0.0–149.0)
VLDL: 25.2 mg/dL (ref 0.0–40.0)

## 2020-09-24 LAB — COMPREHENSIVE METABOLIC PANEL
ALT: 15 U/L (ref 0–53)
AST: 25 U/L (ref 0–37)
Albumin: 4 g/dL (ref 3.5–5.2)
Alkaline Phosphatase: 75 U/L (ref 39–117)
BUN: 15 mg/dL (ref 6–23)
CO2: 29 mEq/L (ref 19–32)
Calcium: 9.5 mg/dL (ref 8.4–10.5)
Chloride: 105 mEq/L (ref 96–112)
Creatinine, Ser: 1.03 mg/dL (ref 0.40–1.50)
GFR: 71.39 mL/min (ref >60.00)
Glucose, Bld: 111 mg/dL — ABNORMAL HIGH (ref 70–99)
Potassium: 4.3 mEq/L (ref 3.5–5.1)
Sodium: 140 mEq/L (ref 135–145)
Total Bilirubin: 0.6 mg/dL (ref 0.2–1.2)
Total Protein: 7.3 g/dL (ref 6.0–8.3)

## 2020-09-24 LAB — GLUCOSE, POCT (MANUAL RESULT ENTRY): POC Glucose: 165 mg/dl — AB (ref 70–99)

## 2020-09-24 MED ORDER — SYNJARDY XR 5-1000 MG PO TB24: 2.0000 | ORAL_TABLET | Freq: Every day | ORAL | 2 refills | Status: DC

## 2020-09-24 NOTE — Patient Instructions (Addendum)
Check blood sugars on waking up 7 days a week  Also check blood sugars about 2 hours after meals and do this after different meals 3 times daily  Recommended blood sugar levels on waking up are 90-130 and about 2 hours after meal is 130-160  Please bring your blood sugar monitor to each visit, thank you  Watch Bp 2x weekly and call if <356 systolic  Stay hydrated

## 2020-09-24 NOTE — Progress Notes (Signed)
Patient ID: Kevin [name], male   DOB: 05/16/1946, 74 y.o.   MRN: [id number]    Reason for Appointment:  Followup for Type 2 Diabetes      History of Present Illness:          Diagnosis: Type 2 diabetes mellitus, date of diagnosis: 2001        Past history: His previous records are not available but initially his symptoms included weight loss He thinks he has been on metformin for several years and more recently has been taking 1000 mg twice a day [facility] His A1c was 6.9 in 2013 but a year later was 8.1 He had been on glipizide 5 mg twice a day in addition to his metformin 1 g twice a day   Prior to his consultation when his A1c was 10% and he was given Januvia in addition  Recent history:   Oral hypoglycemic drugs the patient is taking FUX:NATFTDD XR 100/1000 1 tablet daily  His A1c is 6.1 compared to 6.5 and consistently under 7  Current management, blood sugar patterns and problems identified:   He has not been seen in follow-up since 1/21  He has started using the freestyle libre and this was downloaded  However has only data from 27% of the last 2 weeks Sensor activity  He does not check his sugars at night and mostly in the mornings not every day also  His blood sugars appear to be fairly good overnight but going up to variable degrees after breakfast and lunch based on his diet  However he is still eating cereal and fruit in the morning for breakfast  Occasionally his fasting reading may also be relatively high especially in the last week  He has not taken his Janumet in the last few days because of running out of the medication  He may have lost a little weight  He is doing very well with 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    Mean/median:      136   POST-MEAL PC Breakfast PC Lunch PC Dinner  Glucose range:     Mean/median:  156  154 ?  180    Prior data  PRE-MEAL  morning  midday Dinner Bedtime Overall  Glucose range:  104-141  112, 157  116-140    Mean/median:     ?    Glycemic control:    Lab Results  Component Value Date   HGBA1C 6.1 (A) 09/24/2020   HGBA1C 6.5 11/30/2019   HGBA1C 6.7 (H) 07/20/2019   Lab Results  Component Value Date   MICROALBUR 2.3 (H) 07/20/2019   LDLCALC 64 07/20/2019   CREATININE 1.33 (H) 08/15/2020     Self-care: The diet that the patient has been following is: tries to limit portions, may get fried food when eating out, some fast food    Meals: 3 meals per day.  breakfast is at 7-8 am and is usually eating  special K. cereal  Usually eating baked chicken or chicken salad sandwiches for meals and will have fruits for snacks          Exercise:    up to 10 miles a week usually      Dietician visit: Most recent:2000              Weight history:  Wt Readings from Last 3 Encounters:  09/24/20 176 lb 12.8 oz (80.2 kg)  08/28/20 184 lb (83.5 kg)  08/15/20 177 lb (80.3 kg)    OTHER active problems: See Review of systems  Office Visit on 09/24/2020  Component Date Value Ref Range Status  . Hemoglobin A1C 09/24/2020 6.1* 4.0 - 5.6 % Final  . POC Glucose 09/24/2020 165* 70 - 99 mg/dl Final    Allergies as of 09/24/2020      Reactions   Sulfa Antibiotics Swelling   Swelling of lips only.   5-alpha Reductase Inhibitors       Medication List       Accurate as of September 24, 2020  3:12 PM. If you have any questions, ask your nurse or doctor.        STOP taking these medications   Janumet XR [phone] MG Tb24 Generic drug: SitaGLIPtin-MetFORMIN  HCl Stopped by: Elayne Snare, MD     TAKE these medications   Accu-Chek Compact Plus test strip Generic drug: glucose blood use 1 TEST STRIP once daily as directed   aspirin EC 81 MG tablet Take 81 mg by mouth daily. Swallow whole.   atorvastatin 80 MG tablet Commonly known as: LIPITOR Take 40 mg by mouth daily at 6 PM.   buPROPion 150 MG 24 hr tablet Commonly known as: WELLBUTRIN XL Take 2 tablets daily What changed:   how much to take  how to take this  when to take this  additional instructions   busPIRone 5 MG tablet Commonly known as: BUSPAR Take 5 mg by mouth daily.   carvedilol 12.5 MG tablet Commonly known as: COREG Take 6.25 mg by mouth 2 (two) times daily with a meal.   CHELATED MAGNESIUM PO Take 250 mg by mouth daily.   clobetasol ointment 0.05 % Commonly known as: TEMOVATE Apply 1 application topically daily as needed (itching).   clopidogrel 75 MG tablet Commonly known as: PLAVIX TAKE 1 TABLET(75 MG) BY MOUTH DAILY   clopidogrel 75 MG tablet Commonly known as: PLAVIX TAKE 1 TABLET(75 MG) BY MOUTH DAILY   donepezil 5 MG tablet Commonly known as: ARICEPT TAKE 1 TABLET BY MOUTH DAILY   FreeStyle Libre 14 Day Reader Kerrin Mo 1 each by Does not apply route See admin instructions. Use Freestyle Libre Reader to monitor blood sugars.   FreeStyle Libre 14 Day Sensor Misc Change every 14 days as directed or needed   LORazepam 0.5 MG tablet Commonly known as: ATIVAN Take 0.5 mg by mouth every 8 (eight) hours as needed for anxiety.   memantine 10 MG tablet Commonly known as: NAMENDA Take 1 tablet every night for 2 weeks, then increase to 1 tablet twice a day What changed:   how much to take  how to take this  when to take this  additional instructions   Synjardy XR 03-999 MG Tb24 Generic drug: Empagliflozin-metFORMIN HCl ER Take 2 tablets by mouth daily. Started by: Elayne Snare, MD   Viagra 100 MG tablet Generic drug: sildenafil TAKE ONE  TABLET BY MOUTH AS NEEDED FOR ERECTILE DYSFUNCTION. What changed: See the new instructions.  Allergies:  Allergies  Allergen Reactions  . Sulfa Antibiotics Swelling    Swelling of lips only.  Marland Kitchen 5-Alpha Reductase Inhibitors     Past Medical History:  Diagnosis Date  . Agent orange exposure    in [place]  . Anemia   . Heart block AV second degree    a. s/p PPM implant with subsequent CRTD upgrade  . High cholesterol   . History of colon polyps   . Hypertension   . LBBB (left bundle branch block)   . Nonischemic cardiomyopathy ([place])    a. MDT CRTD upgrade 2016  . Pacemaker 08/27/2013   Dual-chamber Medtronic Adapta implanted January 2013 for bradycardia with alternating bundle branch block and 2:1 atrioventricular block   . Prostate cancer ([place])    "not been treated yet; on a wait and see" (01/22/2015)  . Type II diabetes mellitus ([place])     Past Surgical History:  Procedure Laterality Date  . BASAL CELL CARCINOMA EXCISION     "back, face, neck"  . BI-VENTRICULAR IMPLANTABLE CARDIOVERTER DEFIBRILLATOR UPGRADE N/A 01/22/2015   MDT CRTD upgrade by Dr Rayann Heman  . BIV ICD GENERATOR CHANGEOUT N/A 08/28/2020   Procedure: BIV ICD GENERATOR CHANGEOUT;  Surgeon: [name], MD;  Location: [facility];  Service: Cardiovascular;  Laterality: N/A;  . CARDIAC CATHETERIZATION  01/27/2012   normal coronaries, dilated LV c/w nonischemic cardiomyopathy  . EXAMINATION UNDER ANESTHESIA  04/06/2012   Procedure: EXAM UNDER ANESTHESIA;  Surgeon: Marcello Moores A. Cornett, MD;  Location: [facility];  Service: General;  Laterality: N/A;  . INCISION AND DRAINAGE PERIRECTAL ABSCESS  ~ 2010; 2015  . INGUINAL HERNIA REPAIR Left 1980  . LEFT HEART CATHETERIZATION WITH CORONARY ANGIOGRAM N/A 01/27/2012   Procedure: LEFT HEART CATHETERIZATION WITH CORONARY ANGIOGRAM;  Surgeon: Troy Sine, MD;  Location: [facility] CATH LAB;  Service: Cardiovascular;  Laterality: N/A;  . NM MYOCAR PERF WALL MOTION  01/21/2012    mod-severe perfusion defect due to infarct/scar w/mild perinfarct ischemia in the apical,basal inferoseptal,basal inferior,mid inferoseptal,midinferior and apical inferior regions  . PERMANENT PACEMAKER INSERTION N/A 12/01/2011   Medtronic implanted by Dr Sallyanne Kuster  . PROSTATE BIOPSY    . PROSTATE BIOPSY    . shrapnel surgery     "in [place]"  . TONSILLECTOMY      Family History  Problem Relation Age of Onset  . Ovarian cancer Mother   . Leukemia Father   . Heart disease Father   . Diabetes Neg Hx     Social History:  reports that he quit smoking about 36 years ago. His smoking use included cigarettes. He has a 20.00 pack-year smoking history. He has never used smokeless tobacco. He reports current alcohol use. He reports that he does not use drugs.    Review of Systems  .   He has eye exams every 6 months regularly at the [facility], again no records available      Lipids: He has been treated with Lipitor 40 mg with adequate control       Lab Results  Component Value Date   CHOL 125 07/20/2019   HDL 34.10 (L) 07/20/2019   LDLCALC 64 07/20/2019   LDLDIRECT 58.6 09/24/2014   TRIG 134.0 07/20/2019   CHOLHDL 4 07/20/2019     Hypertension with history of CHF: Is on carvedilol    Home BP checked by his wife and he does not know the numbers  BP Readings from Last 3 Encounters:  09/24/20 126/82  08/28/20 (!) 179/83  08/15/20 124/76         Physical Examination:  Diabetic Foot Exam - Simple   Simple Foot Form Diabetic Foot exam was performed with the following findings: Yes   Visual Inspection No deformities, no ulcerations, no other skin breakdown bilaterally: Yes Sensation Testing Intact to touch and monofilament testing bilaterally: Yes Pulse Check Posterior Tibialis and Dorsalis pulse intact bilaterally: Yes Comments    No ankle edema   BP 126/82   Pulse 70   Ht 6' 4.5" (1.943 m)   Wt 176 lb 12.8 oz (80.2 kg)   SpO2 95%   BMI 21.24 kg/m    ASSESSMENT/PLAN:     Diabetes type 2, Nonobese  See history of present illness for detailed discussion of current diabetes management, blood sugar patterns and problems identified     His A1c is 6.1 on Janumet  He is using the freestyle libre but checking blood sugars somewhat infrequently and mostly in the mornings May be having relatively high readings after certain meals possibly when he is eating cereal and banana in the morning He is doing very well with exercise regimen with walking Also likely is doing fairly well with his diet but will still occasionally have blood sugar spikes after lunch, not clear if he has high readings after dinner  Recommendations:  Try to check readings 4 times a day including at bedtime  Keep the sensor on for 2 weeks and may not necessarily have to do it all the time  Have a protein like boiled egg, cheese or nuts instead of fruits with a smaller portion of cereal in the morning  Trial of JARDIANCE using Synjardy XR 03/999 daily Discussed action of SGLT 2 drugs on lowering glucose by decreasing kidney absorption of glucose, benefits of weight loss and lower blood pressure, possible side effects including candidiasis and dosage regimen   We will need to make sure he is checking his blood pressure twice a week and let us know if it is below 115 May need to adjust his carvedilol if blood pressure trends low If his blood sugars are still relatively high will increase the Jardiance dose on the next visit  Make sure he is staying well-hydrated also if he is having any acute illness that makes him prone to dehydration he will stop Jardiance temporarily  LIPIDS: Needs follow-up today Also need to recheck urine microalbumin  Recheck renal function and follow-up in about 6 weeks  Patient Instructions  Check blood sugars on waking up 7 days a week  Also check blood sugars about 2 hours after meals and do this after different meals 3 times  daily  Recommended blood sugar levels on waking up are 90-130 and about 2 hours after meal is 130-160  Please bring your blood sugar monitor to each visit, thank you  Watch Bp 2x weekly and call if <631 systolic  Stay hydrated     Elayne Snare 09/24/2020, 3:12 PM     Note: This office note was prepared with Dragon voice recognition system technology. Any transcriptional errors that result from this process are unintentional.

## 2020-09-26 NOTE — Patient Instructions (Signed)
Hi Kevin [name],  It was lovely to get to meet you over the phone! As we discussed if you haven't already, don't forget to make your annual appointment to see Dr. Elease Hashimoto. I will plan on having my assistant Mimi briefly reach out to you in about 3 months to check in on your blood pressure and blood sugars, but otherwise I will plan to talk to you in another 6 months.  Please give me a call if you have questions or need anything before our follow up!  Best, Maddie  Visit Information  Goals Addressed            This Visit's Progress   . Pharmacy Care Plan       CARE PLAN ENTRY  Current Barriers:  . Chronic Disease Management support, education, and care coordination needs related to Hypertension, Hyperlipidemia, Diabetes, Heart Failure, Anxiety, and Intracranial stenosis, mild cognitive impairment    Hypertension . Pharmacist Clinical Goal(s): o Over the next 180 days, patient will work with PharmD and providers to maintain BP goal <130/80 . Current regimen:  o Carvedilol 12.5mg , 0.5 tablet twice daily   . Patient self care activities - Over the next 180 days, patient will: o Check BP at home, document, and provide at future appointments  Hyperlipidemia . Pharmacist Clinical Goal(s): o Over the next 180 days, patient will work with PharmD and providers to maintain LDL goal < 70 . Current regimen:  o Atorvastatin 80mg , 40mg  daily at 6 pm  . Patient self care activities - Over the next 180 days, patient will: o Continue current medication.  o Make an appointment with Dr. Elease Hashimoto for annual exam.   Diabetes . Pharmacist Clinical Goal(s): o Over the next 180 days, patient will work with PharmD and providers to maintain A1c goal <7% . Current regimen:  o Janumet XR 100-1000mg , 1 tablet daily  . Patient self care activities - Over the next 180 days, patient will: o Check blood sugar using Freestyle Libre, document, and provide at future appointments  Heart failure  . Pharmacist  Clinical Goal(s) o Over the next 180 days, patient will work with PharmD and providers to  . Current regimen:  o Carvedilol 12.5mg , 0.5 tablet twice daily   . Patient self care activities - Over the next 180 days, patient will: o Continue current medication and report shortness of breath/swelling to providers.  Intracranial stenosis  . Pharmacist Clinical Goal(s) o Over the next 180 days, patient will work with PharmD and providers to continue current medications to decrease stroke event.  . Current regimen:   Clopidogrel 75mg , 1 tablet once daily  ASA 81mg , 1 tablet daily . Patient self care activities o Patient will discuss with Dr. Delice Lesch about plan for blood thinners.  Anxiety . Pharmacist Clinical Goal(s) o Over the next 180 days, patient will work with PharmD and providers to continue current medications to control mood.  . Current regimen:   Bupropion (Wellbutrin XL) 150mg , 2 tablets once daily   Lorazepam 0.5mg , 1 tablet every eight hours as needed  . Patient self care activities o Patient will continue current medications.   Mild cognitive impairment . Pharmacist Clinical Goal(s) o Over the next 180 days, patient will work with PharmD and providers to continue current medications to prevent worsening of memory.  . Current regimen:   Donepezil 5mg , 1 tablet once daily  memantine (Namenda) 10mg , 1 tablet twice a day . Patient self care activities o Patient will continue current medications and follow  up visit with Dr. Ellouise Newer  (neurology).     Medication management . Pharmacist Clinical Goal(s): o Over the next 180 days, patient will work with PharmD and providers to maintain optimal medication adherence . Current pharmacy: [facility] and [place]  . Interventions o Comprehensive medication review performed. o Continue current medication management strategy . Patient self care activities - Over the next 180 days, patient will: o Take medications as  prescribed o Report any questions or concerns to PharmD and/or provider(s)  Please see past updates related to this goal by clicking on the "Past Updates" button in the selected goal         The patient verbalized understanding of instructions, educational materials, and care plan provided today and declined offer to receive copy of patient instructions, educational materials, and care plan.   Telephone follow up appointment with pharmacy team member scheduled for: May 2022

## 2020-10-06 ENCOUNTER — Other Ambulatory Visit: Payer: Self-pay | Admitting: Neurology

## 2020-10-13 ENCOUNTER — Ambulatory Visit (INDEPENDENT_AMBULATORY_CARE_PROVIDER_SITE_OTHER): Payer: Medicare HMO

## 2020-10-13 DIAGNOSIS — I5042 Chronic combined systolic (congestive) and diastolic (congestive) heart failure: Secondary | ICD-10-CM | POA: Diagnosis not present

## 2020-10-13 DIAGNOSIS — Z95810 Presence of automatic (implantable) cardiac defibrillator: Secondary | ICD-10-CM

## 2020-10-14 NOTE — Progress Notes (Signed)
EPIC Encounter for ICM Monitoring  Patient Name: Kevin [name] is a 74 y.o. male Date: 10/14/2020 Primary Care Physican: [name], MD Primary Cardiologist:[name] Electrophysiologist: [name] Bi-V Pacing: 100% 08/15/2020 OfficeWeight:177lbs   Transmission reviewed.  OptivolThoracic impedance normal.  Labs: 09/24/2020 Creatinine 1.03, BUN 15, Potassium 4.3, Sodium 140 08/15/2020 Creatinine 1.33, BUN 13, Potassium 5.2, Sodium 141, GFR 52-60  A complete set of results can be found in Results Review.  Patient does not take a diuretic.  Recommendations:No changes.  Follow-up plan: ICM clinic phone appointment on1/08/2021. 91 day device clinic remote transmission1/25/2022.   EP/Cardiology Office Visits:12/03/2020 with Dr Rayann Heman.  Copy of ICM check sent to Dr.[name].  3 month ICM trend: 10/13/2020    1 Year ICM trend:       Rosalene Billings, RN 10/14/2020 12:04 PM

## 2020-10-29 ENCOUNTER — Encounter: Payer: Medicare HMO | Admitting: Family Medicine

## 2020-11-04 ENCOUNTER — Other Ambulatory Visit: Payer: Self-pay | Admitting: Neurology

## 2020-11-05 ENCOUNTER — Encounter: Payer: Self-pay | Admitting: Family Medicine

## 2020-11-05 ENCOUNTER — Other Ambulatory Visit: Payer: Self-pay

## 2020-11-05 ENCOUNTER — Ambulatory Visit (INDEPENDENT_AMBULATORY_CARE_PROVIDER_SITE_OTHER): Payer: Medicare HMO | Admitting: Family Medicine

## 2020-11-05 VITALS — BP 122/70 | HR 70 | Ht 76.5 in | Wt 173.0 lb

## 2020-11-05 DIAGNOSIS — Z23 Encounter for immunization: Secondary | ICD-10-CM | POA: Diagnosis not present

## 2020-11-05 DIAGNOSIS — C44519 Basal cell carcinoma of skin of other part of trunk: Secondary | ICD-10-CM | POA: Diagnosis not present

## 2020-11-05 DIAGNOSIS — Z0000 Encounter for general adult medical examination without abnormal findings: Secondary | ICD-10-CM | POA: Diagnosis not present

## 2020-11-05 DIAGNOSIS — Z86010 Personal history of colonic polyps: Secondary | ICD-10-CM

## 2020-11-05 NOTE — Patient Instructions (Signed)
I will set up referral for repeat colonoscopy  We are giving Prevnar 13 vaccine today  Consider Shingrix vaccine at some point this year and tetanus (could probably get both at the [place])  Set up diabetic eye exam  Set up to see dermatologist- very likely basal cell cancer right upper chest wall over the clavicle.

## 2020-11-05 NOTE — Progress Notes (Signed)
Established Patient Office Visit  Subjective:  Patient ID: Kevin [name], male    DOB: Aug 12, 1946  Age: 74 y.o. MRN: [id number]  CC:  Chief Complaint  Patient presents with  . Annual Exam    HPI Kevin BROTHERSON presents for physical exam.  He is seen by other specialist including the [facility] and endocrinology.  He had multiple recent labs through endocrinology.  He has multiple chronic problems including history of combined systolic and diastolic heart failure, history of sick sinus syndrome, type 2 diabetes, prostate cancer, history of adenomatous colon polyps, hyperlipidemia.  He is overdue for colonoscopy.  Last colonoscopy 2014 and recommended 3-year follow-up.  He has had previous Pneumovax but no documentation of Prevnar 13.  No history of hepatitis C screening but no clear risk factors.  Also no documentation of Shingrix but is not sure if he has gotten this through the [place].  Overdue for eye exam.  Due for follow-up tetanus.  He served in [place] and had exposure to agent orange.  He is seen regularly through the [place].  Family history-mother had what sounds like uterine cancer but is not sure.  Father had coronary disease in his 74s.  Social history-he is married.  This is a second marriage.  No children.  Quit smoking 1985.  Rare alcohol use.  Past Medical History:  Diagnosis Date  . Agent orange exposure    in [place]  . Anemia   . Heart block AV second degree    a. s/p PPM implant with subsequent CRTD upgrade  . High cholesterol   . History of colon polyps   . Hypertension   . LBBB (left bundle branch block)   . Nonischemic cardiomyopathy ([place])    a. MDT CRTD upgrade 2016  . Pacemaker 08/27/2013   Dual-chamber Medtronic Adapta implanted January 2013 for bradycardia with alternating bundle branch block and 2:1 atrioventricular block   . Prostate cancer ([place])    "not been treated yet; on a wait and see" (01/22/2015)  . Type II diabetes mellitus ([place])     Past  Surgical History:  Procedure Laterality Date  . BASAL CELL CARCINOMA EXCISION     "back, face, neck"  . BI-VENTRICULAR IMPLANTABLE CARDIOVERTER DEFIBRILLATOR UPGRADE N/A 01/22/2015   MDT CRTD upgrade by Dr Rayann Heman  . BIV ICD GENERATOR CHANGEOUT N/A 08/28/2020   Procedure: BIV ICD GENERATOR CHANGEOUT;  Surgeon: [name], MD;  Location: [facility];  Service: Cardiovascular;  Laterality: N/A;  . CARDIAC CATHETERIZATION  01/27/2012   normal coronaries, dilated LV c/w nonischemic cardiomyopathy  . EXAMINATION UNDER ANESTHESIA  04/06/2012   Procedure: EXAM UNDER ANESTHESIA;  Surgeon: Marcello Moores A. Cornett, MD;  Location: [facility];  Service: General;  Laterality: N/A;  . INCISION AND DRAINAGE PERIRECTAL ABSCESS  ~ 2010; 2015  . INGUINAL HERNIA REPAIR Left 1980  . LEFT HEART CATHETERIZATION WITH CORONARY ANGIOGRAM N/A 01/27/2012   Procedure: LEFT HEART CATHETERIZATION WITH CORONARY ANGIOGRAM;  Surgeon: Troy Sine, MD;  Location: [facility] CATH LAB;  Service: Cardiovascular;  Laterality: N/A;  . NM MYOCAR PERF WALL MOTION  01/21/2012   mod-severe perfusion defect due to infarct/scar w/mild perinfarct ischemia in the apical,basal inferoseptal,basal inferior,mid inferoseptal,midinferior and apical inferior regions  . PERMANENT PACEMAKER INSERTION N/A 12/01/2011   Medtronic implanted by Dr Sallyanne Kuster  . PROSTATE BIOPSY    . PROSTATE BIOPSY    . shrapnel surgery     "in [place]"  . TONSILLECTOMY  Family History  Problem Relation Age of Onset  . Ovarian cancer Mother   . Cancer Mother        ?uterine  . Leukemia Father   . Heart disease Father   . Diabetes Neg Hx     Social History   Socioeconomic History  . Marital status: Married    Spouse name: Not on file  . Number of children: 0  . Years of education: Not on file  . Highest education level: Not on file  Occupational History  . Occupation: Surveyor, minerals: [employer]  . Occupation: Retired  Tobacco Use  .  Smoking status: Former Smoker    Packs/day: 1.00    Years: 20.00    Pack years: 20.00    Types: Cigarettes    Quit date: 04/13/1984    Years since quitting: 36.5  . Smokeless tobacco: Never Used  Vaping Use  . Vaping Use: Never used  Substance and Sexual Activity  . Alcohol use: Yes    Comment: 01/22/2015 "maybe 3 beers or glasses of wine /month"  . Drug use: No  . Sexual activity: Yes    Partners: Female  Other Topics Concern  . Not on file  Social History Narrative   Pt lives in [place] with spouse in 2 story home. He has three step children. 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 [place] Veteran. 14 years of education.    Social Determinants of Health   Financial Resource Strain: Low Risk   . Difficulty of Paying Living Expenses: Not hard at all  Food Insecurity: No Food Insecurity  . Worried About Charity fundraiser in the Last Year: Never true  . Ran Out of Food in the Last Year: Never true  Transportation Needs: No Transportation Needs  . Lack of Transportation (Medical): No  . Lack of Transportation (Non-Medical): No  Physical Activity: Sufficiently Active  . Days of Exercise per Week: 2 days  . Minutes of Exercise per Session: 150+ min  Stress: No Stress Concern Present  . Feeling of Stress : Not at all  Social Connections: Socially Integrated  . Frequency of Communication with Friends and Family: More than three times a week  . Frequency of Social Gatherings with Friends and Family: Three times a week  . Attends Religious Services: More than 4 times per year  . Active Member of Clubs or Organizations: Yes  . Attends Archivist Meetings: More than 4 times per year  . Marital Status: Married  Human resources officer Violence: Not At Risk  . Fear of Current or Ex-Partner: No  . Emotionally Abused: No  . Physically Abused: No  . Sexually Abused: No    Outpatient Medications Prior to Visit  Medication Sig Dispense Refill  .  ACCU-CHEK COMPACT PLUS test strip use 1 TEST STRIP once daily as directed 102 each 5  . aspirin EC 81 MG tablet Take 81 mg by mouth daily. Swallow whole.    Marland Kitchen atorvastatin (LIPITOR) 80 MG tablet Take 40 mg by mouth daily at 6 PM.    . buPROPion (WELLBUTRIN XL) 150 MG 24 hr tablet Take 2 tablets daily (Patient taking differently: Take 300 mg by mouth daily. ) 180 tablet 3  . busPIRone (BUSPAR) 5 MG tablet TAKE 1 TABLET BY MOUTH TWICE DAILY FOR 1 WEEK. MAY INCREASE TO 3 TIMES A DAY 90 tablet 0  . carvedilol (COREG) 12.5 MG tablet Take 6.25 mg by  mouth 2 (two) times daily with a meal.    . CHELATED MAGNESIUM PO Take 250 mg by mouth daily.    . clobetasol ointment (TEMOVATE) 0.05 % Apply 1 application topically daily as needed (itching).     . clopidogrel (PLAVIX) 75 MG tablet TAKE 1 TABLET(75 MG) BY MOUTH DAILY 30 tablet 2  . Continuous Blood Gluc Receiver (FREESTYLE LIBRE 14 DAY READER) DEVI 1 each by Does not apply route See admin instructions. Use Freestyle Libre Reader to monitor blood sugars. 1 each 0  . Continuous Blood Gluc Sensor (FREESTYLE LIBRE 14 DAY SENSOR) MISC Change every 14 days as directed or needed 6 each 2  . donepezil (ARICEPT) 5 MG tablet TAKE 1 TABLET BY MOUTH DAILY 90 tablet 0  . Empagliflozin-metFORMIN HCl ER (SYNJARDY XR) 03-999 MG TB24 Take 2 tablets by mouth daily. 30 tablet 2  . LORazepam (ATIVAN) 0.5 MG tablet Take 0.5 mg by mouth every 8 (eight) hours as needed for anxiety.     . memantine (NAMENDA) 10 MG tablet Take 1 tablet every night for 2 weeks, then increase to 1 tablet twice a day (Patient taking differently: Take 10 mg by mouth 2 (two) times daily. ) 60 tablet 11  . VIAGRA 100 MG tablet TAKE ONE TABLET BY MOUTH AS NEEDED FOR ERECTILE DYSFUNCTION. (Patient taking differently: Take 50 mg by mouth as needed for erectile dysfunction. ) 10 tablet 3  . clopidogrel (PLAVIX) 75 MG tablet TAKE 1 TABLET(75 MG) BY MOUTH DAILY 30 tablet 2   No facility-administered  medications prior to visit.    Allergies  Allergen Reactions  . Sulfa Antibiotics Swelling    Swelling of lips only.  Marland Kitchen 5-Alpha Reductase Inhibitors     ROS Review of Systems  Constitutional: Negative for fatigue and unexpected weight change.  Eyes: Negative for visual disturbance.  Respiratory: Negative for cough, chest tightness and shortness of breath.   Cardiovascular: Negative for chest pain, palpitations and leg swelling.  Endocrine: Negative for polydipsia and polyuria.  Neurological: Negative for dizziness, syncope, weakness, light-headedness and headaches.      Objective:    Physical Exam Vitals reviewed.  Constitutional:      General: He is not in acute distress.    Appearance: Normal appearance. He is not ill-appearing.  HENT:     Right Ear: Tympanic membrane normal.     Left Ear: Tympanic membrane normal.  Cardiovascular:     Rate and Rhythm: Normal rate and regular rhythm.  Pulmonary:     Effort: Pulmonary effort is normal.     Breath sounds: Normal breath sounds.  Abdominal:     Palpations: Abdomen is soft.     Tenderness: There is no abdominal tenderness.  Musculoskeletal:     Cervical back: Neck supple.     Right lower leg: No edema.     Left lower leg: No edema.  Lymphadenopathy:     Cervical: No cervical adenopathy.  Skin:    Comments: Multiple scattered seborrheic keratoses on his extremities and trunk.  He has multiple epidermal cyst on his extremities and trunk.  He has approximately 5 mm nodular lesion over the lateral aspect of the right clavicle on the chest wall with umbilicated center and telangiectasias consistent with likely basal cell carcinoma  Neurological:     General: No focal deficit present.     Mental Status: He is alert.     BP 122/70   Pulse 70   Ht 6' 4.5" (1.943 m)  Wt 173 lb (78.5 kg)   SpO2 96%   BMI 20.78 kg/m  Wt Readings from Last 3 Encounters:  11/05/20 173 lb (78.5 kg)  09/24/20 176 lb 12.8 oz (80.2 kg)   08/28/20 184 lb (83.5 kg)     Health Maintenance Due  Topic Date Due  . Hepatitis C Screening  Never done  . OPHTHALMOLOGY EXAM  Never done  . COVID-19 Vaccine (1) Never done  . PNA vac Low Risk Adult (2 of 2 - PCV13) 08/09/2015  . COLONOSCOPY (Pts 45-15yrs Insurance coverage will need to be confirmed)  08/20/2016  . TETANUS/TDAP  11/15/2018    There are no preventive care reminders to display for this patient.  Lab Results  Component Value Date   TSH 0.49 06/06/2019   Lab Results  Component Value Date   WBC 7.7 08/15/2020   HGB 16.3 08/15/2020   HCT 47.5 08/15/2020   MCV 98 (H) 08/15/2020   PLT 192 08/15/2020   Lab Results  Component Value Date   NA 140 09/24/2020   K 4.3 09/24/2020   CO2 29 09/24/2020   GLUCOSE 111 (H) 09/24/2020   BUN 15 09/24/2020   CREATININE 1.03 09/24/2020   BILITOT 0.6 09/24/2020   ALKPHOS 75 09/24/2020   AST 25 09/24/2020   ALT 15 09/24/2020   PROT 7.3 09/24/2020   ALBUMIN 4.0 09/24/2020   CALCIUM 9.5 09/24/2020   ANIONGAP 10 01/23/2015   GFR 71.39 09/24/2020   Lab Results  Component Value Date   CHOL 103 09/24/2020   Lab Results  Component Value Date   HDL 39.10 09/24/2020   Lab Results  Component Value Date   LDLCALC 39 09/24/2020   Lab Results  Component Value Date   TRIG 126.0 09/24/2020   Lab Results  Component Value Date   CHOLHDL 3 09/24/2020   Lab Results  Component Value Date   HGBA1C 6.1 (A) 09/24/2020      Assessment & Plan:   #1 physical exam.  We addressed several health maintenance issues as below  -Prevnar 13 given -Flu vaccine already given -Set up repeat colonoscopy.  He had last colonoscopy reported 2014 with history of adenomatous polyps.  He is overdue. -Consider hepatitis C with next blood draw -Consider Shingrix vaccine.  [facility] will [facility] at [facility] -Set up diabetic eye exam -Check with the [place] regarding his last tetanus to see if he is due -We elected not to get any follow-up labs since he  appears to be up-to-date and had several recently through endocrinology  #2 probable basal cell carcinoma right upper chest wall  -He is already established with dermatology and will set up follow-up with [facility] dermatology for that.  No orders of the defined types were placed in this encounter.   Follow-up: No follow-ups on file.    Carolann Littler, MD

## 2020-11-12 ENCOUNTER — Other Ambulatory Visit: Payer: Self-pay | Admitting: Endocrinology

## 2020-11-12 ENCOUNTER — Other Ambulatory Visit (INDEPENDENT_AMBULATORY_CARE_PROVIDER_SITE_OTHER): Payer: Medicare HMO

## 2020-11-12 ENCOUNTER — Other Ambulatory Visit: Payer: Self-pay

## 2020-11-12 DIAGNOSIS — E1165 Type 2 diabetes mellitus with hyperglycemia: Secondary | ICD-10-CM

## 2020-11-12 LAB — BASIC METABOLIC PANEL
BUN: 13 mg/dL (ref 6–23)
CO2: 28 mEq/L (ref 19–32)
Calcium: 9.1 mg/dL (ref 8.4–10.5)
Chloride: 104 mEq/L (ref 96–112)
Creatinine, Ser: 1.35 mg/dL (ref 0.40–1.50)
GFR: 51.55 mL/min — ABNORMAL LOW (ref >60.00)
Glucose, Bld: 217 mg/dL — ABNORMAL HIGH (ref 70–99)
Potassium: 4.4 mEq/L (ref 3.5–5.1)
Sodium: 138 mEq/L (ref 135–145)

## 2020-11-13 LAB — FRUCTOSAMINE: Fructosamine: 272 umol/L (ref 0–285)

## 2020-11-17 ENCOUNTER — Ambulatory Visit (INDEPENDENT_AMBULATORY_CARE_PROVIDER_SITE_OTHER): Payer: Medicare HMO

## 2020-11-17 DIAGNOSIS — I5042 Chronic combined systolic (congestive) and diastolic (congestive) heart failure: Secondary | ICD-10-CM

## 2020-11-17 DIAGNOSIS — Z95810 Presence of automatic (implantable) cardiac defibrillator: Secondary | ICD-10-CM

## 2020-11-18 ENCOUNTER — Telehealth: Payer: Self-pay | Admitting: Endocrinology

## 2020-11-18 NOTE — Telephone Encounter (Signed)
Kevin [name] with [facility] ph# [phone] called re: Requests that Dr. Dwyane Dee prescribe a 30 day supply instead of a 14 day supply for Synjardy since patient is taking 2 tablets daily

## 2020-11-18 NOTE — Telephone Encounter (Signed)
He was given 15-day supply only because he does not keep his appointments

## 2020-11-18 NOTE — Progress Notes (Signed)
EPIC Encounter for ICM Monitoring  Patient Name: Kevin [name] is a 75 y.o. male Date: 11/18/2020 Primary Care Physican: [name], MD Primary Cardiologist:[name] Electrophysiologist: [name] Bi-V Pacing: 100% 11/05/2020 OfficeWeight:173lbs   Transmission reviewed.  OptivolThoracic impedance normal.  Labs: 11/12/2020 Creatinine 1.35, BUN 13, Potassium 4.4 Sodium 138, GFR 51.55 09/24/2020 Creatinine 1.03, BUN 15, Potassium 4.3, Sodium 140 08/15/2020 Creatinine 1.33, BUN 13, Potassium 5.2, Sodium 141, GFR 52-60  A complete set of results can be found in Results Review.  Patient does not take a diuretic.  Recommendations:No changes.  Follow-up plan: ICM clinic phone appointment on2/14/2022. 91 day device clinic remote transmission1/25/2022.   EP/Cardiology Office Visits:12/03/2020 with Dr Rayann Heman.  Copy of ICM check sent to Dr.[name].  3 month ICM trend: 11/17/2020.    1 Year ICM trend:       Rosalene Billings, RN 11/18/2020 12:25 PM

## 2020-11-19 ENCOUNTER — Ambulatory Visit: Payer: Medicare HMO | Admitting: Endocrinology

## 2020-11-20 ENCOUNTER — Encounter: Payer: Self-pay | Admitting: Endocrinology

## 2020-11-20 ENCOUNTER — Ambulatory Visit (INDEPENDENT_AMBULATORY_CARE_PROVIDER_SITE_OTHER): Payer: Medicare HMO | Admitting: Endocrinology

## 2020-11-20 ENCOUNTER — Other Ambulatory Visit: Payer: Self-pay

## 2020-11-20 VITALS — BP 124/78 | HR 76 | Temp 98.2°F | Resp 16 | Ht 75.0 in | Wt 173.0 lb

## 2020-11-20 DIAGNOSIS — E119 Type 2 diabetes mellitus without complications: Secondary | ICD-10-CM

## 2020-11-20 MED ORDER — SYNJARDY XR 5-1000 MG PO TB24
2.0000 | ORAL_TABLET | Freq: Every day | ORAL | 2 refills | Status: DC
Start: 2020-11-20 — End: 2021-04-09

## 2020-11-20 NOTE — Progress Notes (Signed)
1         Patient ID: Kevin [name], male   DOB: 01/20/1946, 75 y.o.   MRN: [id number]    Reason for Appointment:  Followup for Type 2 Diabetes      History of Present Illness:          Diagnosis: Type 2 diabetes mellitus, date of diagnosis: 2001        Past history: His previous records are not available but initially his symptoms included weight loss He thinks he has been on metformin for several years and more recently has been taking 1000 mg twice a day [facility] his [facility] His A1c was 6.9 in 2013 but a year later was 8.1 He had been on glipizide 5 mg twice a day in addition to his metformin 1 g twice a day   Prior to his consultation when his A1c was 10% and he was given Januvia in addition  Recent history:   Oral hypoglycemic drugs the patient is taking are: Synjardy XR 03/999, 2 tablets daily     His A1c is last 6.1 compared to 6.5 and consistently under 7 Fructosamine is 272  Current management, blood sugar patterns and problems identified:   Despite reminders he is only checking his blood sugars very sporadically on his freestyle libre even though he sensor most of the time  His wife is helping him apply this  However has only data from 12 % of the last 2 weeks  Available blood sugars are fairly well controlled with only 1 blood sugar spike around midday last months with blood sugar around 250  Otherwise blood sugar range between 88-156  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    Mean/median:      136   POST-MEAL PC Breakfast PC Lunch PC Dinner  Glucose range:     Mean/median:  156  154 ?  180    Glycemic control:    Lab Results  Component Value Date   HGBA1C 6.1 (A) 09/24/2020   HGBA1C 6.5 11/30/2019   HGBA1C 6.7 (H) 07/20/2019   Lab Results  Component Value Date   MICROALBUR 2.5 (H) 09/24/2020   LDLCALC 39 09/24/2020   CREATININE 1.35 11/12/2020     Self-care: The diet that the patient has been following is: tries to limit portions, may get fried food when eating out, some fast food    Meals: 3 meals per day.  breakfast is at 7-8 am and is usually eating  special K. cereal Usually eating baked chicken or chicken salad sandwiches for meals and will have fruits for snacks          Dietician visit: Most recent:2000  Weight history:  Wt Readings from Last 3 Encounters:  11/20/20 173 lb (78.5 kg)  11/05/20 173 lb (78.5 kg)  09/24/20 176 lb 12.8 oz (80.2 kg)    OTHER active problems: See Review of systems  No visits with results within 1 Week(s) from this visit.  Latest known visit with results is:  Lab on 11/12/2020  Component Date Value Ref Range Status  . Sodium 11/12/2020 138  135 - 145 mEq/L Final  . Potassium 11/12/2020 4.4  3.5 - 5.1 mEq/L Final  . Chloride 11/12/2020 104  96 - 112 mEq/L Final  . CO2 11/12/2020 28  19 - 32 mEq/L Final  . Glucose, Bld 11/12/2020 217* 70 - 99 mg/dL Final  . BUN 11/12/2020 13  6 - 23 mg/dL Final  . Creatinine, Ser 11/12/2020 1.35  0.40 - 1.50 mg/dL Final  . GFR 11/12/2020 51.55* >60.00 mL/min Final   Calculated using the CKD-EPI Creatinine Equation (2021)  . Calcium 11/12/2020 9.1  8.4 - 10.5 mg/dL Final  . Fructosamine 11/12/2020 272  0 - 285 umol/L Final   Comment: Published reference interval for apparently healthy subjects between age 49 and  61 is 65 - 285 umol/L and in a poorly controlled diabetic population is 228 - 563 umol/L with a mean of 396 umol/L.     Allergies as of 11/20/2020      Reactions   Sulfa Antibiotics Swelling   Swelling of lips only.   5-alpha Reductase Inhibitors       Medication List       Accurate as of November 20, 2020  1:25 PM. If you have any questions, ask your nurse or doctor.        Accu-Chek Compact Plus test strip Generic drug: glucose blood use 1 TEST STRIP once daily as directed   aspirin EC 81 MG tablet Take 81 mg by mouth daily. Swallow whole.   atorvastatin 80 MG tablet Commonly known as: LIPITOR Take 40 mg by mouth daily at 6 PM.   buPROPion 150 MG 24 hr tablet Commonly known as: WELLBUTRIN XL Take 2 tablets daily What changed:   how much to take  how to take this  when to take this  additional instructions   busPIRone 5 MG tablet Commonly known as: BUSPAR TAKE 1 TABLET BY MOUTH TWICE DAILY FOR 1 WEEK. MAY INCREASE TO 3 TIMES A DAY   carvedilol 12.5 MG tablet Commonly known as: COREG Take 6.25 mg by mouth 2 (two) times daily with a meal.   CHELATED MAGNESIUM PO Take 250 mg by mouth daily.   clobetasol ointment 0.05 % Commonly known as: TEMOVATE Apply 1 application topically daily as needed (itching).   clopidogrel 75 MG tablet Commonly known as: PLAVIX TAKE 1 TABLET(75 MG) BY MOUTH DAILY   donepezil 5 MG tablet Commonly known as: ARICEPT TAKE 1 TABLET BY MOUTH DAILY   FreeStyle Libre 14 Day Reader Kerrin Mo 1 each by Does not apply route See admin instructions. Use Freestyle Libre Reader to monitor blood sugars.   FreeStyle Libre 14 Day Sensor Misc Change every 14 days as directed or needed   LORazepam 0.5 MG tablet Commonly known as: ATIVAN Take 0.5 mg by mouth every 8 (eight) hours as needed for anxiety.   memantine 10 MG tablet Commonly known as: NAMENDA Take 1 tablet every night for 2 weeks, then increase to 1 tablet twice a day What  changed:   how much to take  how to take this  when to take this  additional instructions   Synjardy XR 03-999 MG Tb24 Generic drug: Empagliflozin-metFORMIN HCl ER Take 2 tablets by mouth daily.   Viagra 100 MG tablet Generic drug: sildenafil TAKE ONE TABLET BY MOUTH AS NEEDED FOR ERECTILE DYSFUNCTION. What changed: See the new instructions.       Allergies:  Allergies  Allergen Reactions  . Sulfa Antibiotics Swelling    Swelling of lips only.  Marland Kitchen 5-Alpha Reductase Inhibitors     Past Medical History:  Diagnosis Date  . Agent orange exposure    in [place]  . Anemia   . Heart block AV second degree    a. s/p PPM implant with subsequent CRTD upgrade  . High cholesterol   . History of colon polyps   . Hypertension   . LBBB (left bundle branch block)   . Nonischemic cardiomyopathy (HCC)    a. MDT CRTD upgrade 2016  . Pacemaker 08/27/2013   Dual-chamber Medtronic Adapta implanted January 2013 for bradycardia with alternating bundle branch block and 2:1 atrioventricular block   . Prostate cancer (HCC)    "not been treated yet; on a wait and see" (01/22/2015)  . Type II diabetes mellitus (HCC)     Past Surgical History:  Procedure Laterality Date  . BASAL CELL CARCINOMA EXCISION     "back, face, neck"  . BI-VENTRICULAR IMPLANTABLE CARDIOVERTER DEFIBRILLATOR UPGRADE N/A 01/22/2015   MDT CRTD upgrade by Dr Johney Frame  . BIV ICD GENERATOR CHANGEOUT N/A 08/28/2020   Procedure: BIV ICD GENERATOR CHANGEOUT;  Surgeon: Hillis Range, MD;  Location: [facility] INVASIVE CV LAB;  Service: Cardiovascular;  Laterality: N/A;  . CARDIAC CATHETERIZATION  01/27/2012   normal coronaries, dilated LV c/w nonischemic cardiomyopathy  . EXAMINATION UNDER ANESTHESIA  04/06/2012   Procedure: EXAM UNDER ANESTHESIA;  Surgeon: Maisie Fus A. Cornett, MD;  Location: [facility] OR;  Service: General;  Laterality: N/A;  . INCISION AND DRAINAGE PERIRECTAL ABSCESS  ~ 2010; 2015  . INGUINAL HERNIA REPAIR Left 1980  . LEFT  HEART CATHETERIZATION WITH CORONARY ANGIOGRAM N/A 01/27/2012   Procedure: LEFT HEART CATHETERIZATION WITH CORONARY ANGIOGRAM;  Surgeon: Lennette Bihari, MD;  Location: [facility] CATH LAB;  Service: Cardiovascular;  Laterality: N/A;  . NM MYOCAR PERF WALL MOTION  01/21/2012   mod-severe perfusion defect due to infarct/scar w/mild perinfarct ischemia in the apical,basal inferoseptal,basal inferior,mid inferoseptal,midinferior and apical inferior regions  . PERMANENT PACEMAKER INSERTION N/A 12/01/2011   Medtronic implanted by Dr Royann Shivers  . PROSTATE BIOPSY    . PROSTATE BIOPSY    . shrapnel surgery     "in [place]"  . TONSILLECTOMY      Family History  Problem Relation Age of Onset  . Ovarian cancer Mother   . Cancer Mother        ?uterine  . Leukemia Father   . Heart disease Father   . Diabetes Neg Hx     Social History:  reports that he quit smoking about 36 years ago. His smoking use included cigarettes. He has a 20.00 pack-year smoking history. He has never used smokeless tobacco. He reports current alcohol use. He reports that he does not use drugs.    Review of Systems  .   He has eye exams every 6 months regularly at the [facility], again no records available      Lipids: He has been treated with Lipitor 40 mg with adequate control       Lab Results  Component Value Date  CHOL 103 09/24/2020   HDL 39.10 09/24/2020   LDLCALC 39 09/24/2020   LDLDIRECT 58.6 09/24/2014   TRIG 126.0 09/24/2020   CHOLHDL 3 09/24/2020     Hypertension with history of CHF: Is on carvedilol    Home BP checked by his wife readings reportedly 120s/70s; as before he cannot remember the readings  BP Readings from Last 3 Encounters:  11/20/20 124/78  11/05/20 122/70  09/24/20 126/82         Physical Examination:   No ankle edema   BP 124/78 (BP Location: Right Arm, Patient Position: Sitting, Cuff Size: Small)   Pulse 76   Temp 98.2 F (36.8 C) (Oral)   Resp 16   Ht 6\' 3"  (1.905  m)   Wt 173 lb (78.5 kg)   SpO2 95%   BMI 21.62 kg/m   ASSESSMENT/PLAN:     Diabetes type 2, Nonobese  See history of present illness for detailed discussion of current diabetes management, blood sugar patterns and problems identified     His A1c last 6.1  For risk reduction he is now on Synjardy instead of Janumet Blood sugars appear to be similarly controlled and fructosamine is excellent at 272  He is using the freestyle libre reportedly regularly but forgets to check his blood sugars Has only a couple of readings sporadically every week and difficult to get a pattern Likely has some postprandial high sugars when she does not monitor including a reading of 214 in the lab Blood pressure, renal function has not changed with starting Synjardy  Recommendations:  Try to check readings 4 times a day including at bedtime He will try to set an alarm to remind him to check his sugars Discussed blood sugar targets for fasting and after meals Avoid high carbohydrate foods and drinks and minimize cereal and milk shakes  A1c on the next visit  There are no Patient Instructions on file for this visit.  Elayne Snare 11/20/2020, 1:25 PM     Note: This office note was prepared with Dragon voice recognition system technology. Any transcriptional errors that result from this process are unintentional.

## 2020-11-20 NOTE — Telephone Encounter (Signed)
Patient has appointment today

## 2020-11-20 NOTE — Patient Instructions (Addendum)
Try to check readings 4 times a day including at bedtime  Set alarm to check  Check blood sugars on waking up 2-3 days a week  Also check blood sugars about 2 hours after meals and do this after different meals by rotation  Recommended blood sugar levels on waking up are 90-130 and about 2 hours after meal is 130-160

## 2020-12-02 ENCOUNTER — Ambulatory Visit (INDEPENDENT_AMBULATORY_CARE_PROVIDER_SITE_OTHER): Payer: Medicare HMO

## 2020-12-02 DIAGNOSIS — I441 Atrioventricular block, second degree: Secondary | ICD-10-CM | POA: Diagnosis not present

## 2020-12-02 LAB — CUP PACEART REMOTE DEVICE CHECK
Battery Remaining Longevity: 76 mo
Battery Voltage: 3.04 V
Brady Statistic AP VP Percent: 0.35 %
Brady Statistic AP VS Percent: 0.01 %
Brady Statistic AS VP Percent: 99.47 %
Brady Statistic AS VS Percent: 0.17 %
Brady Statistic RA Percent Paced: 0.36 %
Brady Statistic RV Percent Paced: 99.77 %
Date Time Interrogation Session: 20220125063540
HighPow Impedance: 83 Ohm
Implantable Lead Implant Date: 20130123
Implantable Lead Implant Date: 20160316
Implantable Lead Implant Date: 20160316
Implantable Lead Location: 753858
Implantable Lead Location: 753859
Implantable Lead Location: 753860
Implantable Lead Model: 4598
Implantable Lead Model: 5076
Implantable Lead Model: 6935
Implantable Pulse Generator Implant Date: 20211021
Lead Channel Impedance Value: 1007 Ohm
Lead Channel Impedance Value: 1007 Ohm
Lead Channel Impedance Value: 1197 Ohm
Lead Channel Impedance Value: 1292 Ohm
Lead Channel Impedance Value: 1292 Ohm
Lead Channel Impedance Value: 285.934
Lead Channel Impedance Value: 285.934
Lead Channel Impedance Value: 309.309
Lead Channel Impedance Value: 353.147
Lead Channel Impedance Value: 353.147
Lead Channel Impedance Value: 418 Ohm
Lead Channel Impedance Value: 456 Ohm
Lead Channel Impedance Value: 475 Ohm
Lead Channel Impedance Value: 513 Ohm
Lead Channel Impedance Value: 646 Ohm
Lead Channel Impedance Value: 646 Ohm
Lead Channel Impedance Value: 722 Ohm
Lead Channel Impedance Value: 779 Ohm
Lead Channel Pacing Threshold Amplitude: 0.5 V
Lead Channel Pacing Threshold Amplitude: 0.5 V
Lead Channel Pacing Threshold Amplitude: 2.375 V
Lead Channel Pacing Threshold Pulse Width: 0.4 ms
Lead Channel Pacing Threshold Pulse Width: 0.4 ms
Lead Channel Pacing Threshold Pulse Width: 1 ms
Lead Channel Sensing Intrinsic Amplitude: 4 mV
Lead Channel Sensing Intrinsic Amplitude: 4 mV
Lead Channel Setting Pacing Amplitude: 1.5 V
Lead Channel Setting Pacing Amplitude: 2.5 V
Lead Channel Setting Pacing Amplitude: 3 V
Lead Channel Setting Pacing Pulse Width: 0.4 ms
Lead Channel Setting Pacing Pulse Width: 1 ms
Lead Channel Setting Sensing Sensitivity: 0.3 mV

## 2020-12-03 ENCOUNTER — Encounter: Payer: Self-pay | Admitting: Internal Medicine

## 2020-12-03 ENCOUNTER — Other Ambulatory Visit: Payer: Self-pay

## 2020-12-03 ENCOUNTER — Ambulatory Visit: Payer: Medicare HMO | Admitting: Internal Medicine

## 2020-12-03 VITALS — BP 122/68 | HR 102 | Ht 75.0 in | Wt 170.4 lb

## 2020-12-03 DIAGNOSIS — I10 Essential (primary) hypertension: Secondary | ICD-10-CM | POA: Diagnosis not present

## 2020-12-03 DIAGNOSIS — I442 Atrioventricular block, complete: Secondary | ICD-10-CM | POA: Diagnosis not present

## 2020-12-03 DIAGNOSIS — I428 Other cardiomyopathies: Secondary | ICD-10-CM | POA: Diagnosis not present

## 2020-12-03 DIAGNOSIS — I5042 Chronic combined systolic (congestive) and diastolic (congestive) heart failure: Secondary | ICD-10-CM | POA: Diagnosis not present

## 2020-12-03 NOTE — Patient Instructions (Signed)
Medication Instructions:  Your physician recommends that you continue on your current medications as directed. Please refer to the Current Medication list given to you today.  Labwork: None ordered.  Testing/Procedures: None ordered.  Follow-Up:  Your physician wants you to follow-up in: one year with   Legrand Como "Jonni Sanger" Tillery, PA-C   You will receive a reminder letter in the mail two months in advance. If you don't receive a letter, please call our office to schedule the follow-up appointment.  Remote monitoring is used to monitor your Pacemaker of ICD from home. This monitoring reduces the number of office visits required to check your device to one time per year. It allows [place] to keep an eye on the functioning of your device to ensure it is working properly. You are scheduled for a device check from home on 12/22/20. You may send your transmission at any time that day. If you have a wireless device, the transmission will be sent automatically. After your physician reviews your transmission, you will receive a postcard with your next transmission date.  Any Other Special Instructions Will Be Listed Below (If Applicable).  If you need a refill on your cardiac medications before your next appointment, please call your pharmacy.

## 2020-12-03 NOTE — Progress Notes (Signed)
PCP: [name], MD Primary Cardiologist: Dr Sallyanne Kuster Primary EP: Dr Jeralyn Bennett is a 75 y.o. male who presents today for routine electrophysiology followup.  Since his recent BiV ICD generator change, the patient reports doing very well.  Today, he denies symptoms of palpitations, chest pain, shortness of breath,  lower extremity edema, dizziness, presyncope, syncope, or ICD shocks.  The patient is otherwise without complaint today.   Past Medical History:  Diagnosis Date  . Agent orange exposure    in [place]  . Anemia   . Heart block AV second degree    a. s/p PPM implant with subsequent CRTD upgrade  . High cholesterol   . History of colon polyps   . Hypertension   . LBBB (left bundle branch block)   . Nonischemic cardiomyopathy ([place])    a. MDT CRTD upgrade 2016  . Pacemaker 08/27/2013   Dual-chamber Medtronic Adapta implanted January 2013 for bradycardia with alternating bundle branch block and 2:1 atrioventricular block   . Prostate cancer ([place])    "not been treated yet; on a wait and see" (01/22/2015)  . Type II diabetes mellitus ([place])    Past Surgical History:  Procedure Laterality Date  . BASAL CELL CARCINOMA EXCISION     "back, face, neck"  . BI-VENTRICULAR IMPLANTABLE CARDIOVERTER DEFIBRILLATOR UPGRADE N/A 01/22/2015   MDT CRTD upgrade by Dr Rayann Heman  . BIV ICD GENERATOR CHANGEOUT N/A 08/28/2020   Procedure: BIV ICD GENERATOR CHANGEOUT;  Surgeon: [name], MD;  Location: [place] CV LAB;  Service: Cardiovascular;  Laterality: N/A;  . CARDIAC CATHETERIZATION  01/27/2012   normal coronaries, dilated LV c/w nonischemic cardiomyopathy  . EXAMINATION UNDER ANESTHESIA  04/06/2012   Procedure: EXAM UNDER ANESTHESIA;  Surgeon: Marcello Moores A. Cornett, MD;  Location: [place];  Service: General;  Laterality: N/A;  . INCISION AND DRAINAGE PERIRECTAL ABSCESS  ~ 2010; 2015  . INGUINAL HERNIA REPAIR Left 1980  . LEFT HEART CATHETERIZATION WITH CORONARY  ANGIOGRAM N/A 01/27/2012   Procedure: LEFT HEART CATHETERIZATION WITH CORONARY ANGIOGRAM;  Surgeon: Troy Sine, MD;  Location: [facility] CATH LAB;  Service: Cardiovascular;  Laterality: N/A;  . NM MYOCAR PERF WALL MOTION  01/21/2012   mod-severe perfusion defect due to infarct/scar w/mild perinfarct ischemia in the apical,basal inferoseptal,basal inferior,mid inferoseptal,midinferior and apical inferior regions  . PERMANENT PACEMAKER INSERTION N/A 12/01/2011   Medtronic implanted by Dr Sallyanne Kuster  . PROSTATE BIOPSY    . PROSTATE BIOPSY    . shrapnel surgery     "in [place]"  . TONSILLECTOMY      ROS- all systems are reviewed and negative except as per HPI above  Current Outpatient Medications  Medication Sig Dispense Refill  . ACCU-CHEK COMPACT PLUS test strip use 1 TEST STRIP once daily as directed 102 each 5  . aspirin EC 81 MG tablet Take 81 mg by mouth daily. Swallow whole.    Marland Kitchen atorvastatin (LIPITOR) 80 MG tablet Take 40 mg by mouth daily at 6 PM.    . buPROPion (WELLBUTRIN XL) 150 MG 24 hr tablet Take 2 tablets daily 180 tablet 3  . busPIRone (BUSPAR) 5 MG tablet TAKE 1 TABLET BY MOUTH TWICE DAILY FOR 1 WEEK. MAY INCREASE TO 3 TIMES A DAY 90 tablet 0  . carvedilol (COREG) 12.5 MG tablet Take 6.25 mg by mouth 2 (two) times daily with a meal.    . CHELATED MAGNESIUM PO Take 250 mg by mouth daily.    Marland Kitchen  clobetasol ointment (TEMOVATE) 1.61 % Apply 1 application topically daily as needed (itching).     . clopidogrel (PLAVIX) 75 MG tablet TAKE 1 TABLET(75 MG) BY MOUTH DAILY 30 tablet 2  . Continuous Blood Gluc Receiver (FREESTYLE LIBRE 14 DAY READER) DEVI 1 each by Does not apply route See admin instructions. Use Freestyle Libre Reader to monitor blood sugars. 1 each 0  . Continuous Blood Gluc Sensor (FREESTYLE LIBRE 14 DAY SENSOR) MISC Change every 14 days as directed or needed 6 each 2  . donepezil (ARICEPT) 5 MG tablet TAKE 1 TABLET BY MOUTH DAILY 90 tablet 0  . Empagliflozin-metFORMIN HCl ER  (SYNJARDY XR) 03-999 MG TB24 Take 2 tablets by mouth daily. 60 tablet 2  . LORazepam (ATIVAN) 0.5 MG tablet Take 0.5 mg by mouth every 8 (eight) hours as needed for anxiety.     . memantine (NAMENDA) 10 MG tablet Take 1 tablet every night for 2 weeks, then increase to 1 tablet twice a day (Patient taking differently: Take 10 mg by mouth 2 (two) times daily.) 60 tablet 11  . VIAGRA 100 MG tablet TAKE ONE TABLET BY MOUTH AS NEEDED FOR ERECTILE DYSFUNCTION. (Patient taking differently: Take 50 mg by mouth as needed for erectile dysfunction.) 10 tablet 3   No current facility-administered medications for this visit.    Physical Exam: Vitals:   12/03/20 1415  BP: 122/68  Pulse: (!) 102  SpO2: 96%  Weight: 170 lb 6.4 oz (77.3 kg)  Height: 6\' 3"  (1.905 m)    GEN- The patient is well appearing, alert and oriented x 3 today.   Head- normocephalic, atraumatic Eyes-  Sclera clear, conjunctiva pink Ears- hearing intact Oropharynx- clear Lungs- normal work of breathing Chest- ICD pocket is well healed Heart- Regular rate and rhythm  GI- soft,  Extremities- no clubbing, cyanosis, or edema  ICD interrogation- reviewed in detail today,  See PACEART report  ekg tracing ordered today is personally reviewed and shows sinus with BiV pacing  Wt Readings from Last 3 Encounters:  12/03/20 170 lb 6.4 oz (77.3 kg)  11/20/20 173 lb (78.5 kg)  11/05/20 173 lb (78.5 kg)    Assessment and Plan:  1.  Chronic systolic dysfunction/ nonischemic CM/ complete heart block euvolemic today Stable on an appropriate medical regimen Normal ICD function See [facility] report No changes today he is device dependant today followed in ICM device clinic Consider entresto.  Will defer to Dr Sallyanne Kuster  2. HTN Stable No change required today   Risks, benefits and potential toxicities for medications prescribed and/or refilled reviewed with patient today.   Return to see EP PA in a year  [name] MD,  [facility] 12/03/2020 2:16 PM

## 2020-12-12 NOTE — Progress Notes (Signed)
Remote ICD transmission.   

## 2020-12-13 ENCOUNTER — Other Ambulatory Visit: Payer: Self-pay | Admitting: Neurology

## 2020-12-15 ENCOUNTER — Other Ambulatory Visit: Payer: Self-pay | Admitting: Neurology

## 2020-12-15 NOTE — Telephone Encounter (Signed)
Sent enough in till appt. Feb 2022 with Delice Lesch

## 2020-12-22 ENCOUNTER — Ambulatory Visit (INDEPENDENT_AMBULATORY_CARE_PROVIDER_SITE_OTHER): Payer: Medicare HMO

## 2020-12-22 DIAGNOSIS — Z95810 Presence of automatic (implantable) cardiac defibrillator: Secondary | ICD-10-CM

## 2020-12-22 DIAGNOSIS — I5042 Chronic combined systolic (congestive) and diastolic (congestive) heart failure: Secondary | ICD-10-CM

## 2020-12-26 ENCOUNTER — Other Ambulatory Visit: Payer: Self-pay

## 2020-12-26 ENCOUNTER — Encounter: Payer: Self-pay | Admitting: Neurology

## 2020-12-26 ENCOUNTER — Ambulatory Visit (INDEPENDENT_AMBULATORY_CARE_PROVIDER_SITE_OTHER): Payer: Medicare HMO | Admitting: Neurology

## 2020-12-26 VITALS — BP 118/73 | HR 78 | Ht 75.0 in | Wt 170.4 lb

## 2020-12-26 DIAGNOSIS — R2681 Unsteadiness on feet: Secondary | ICD-10-CM

## 2020-12-26 DIAGNOSIS — Z8673 Personal history of transient ischemic attack (TIA), and cerebral infarction without residual deficits: Secondary | ICD-10-CM

## 2020-12-26 DIAGNOSIS — G3184 Mild cognitive impairment, so stated: Secondary | ICD-10-CM | POA: Diagnosis not present

## 2020-12-26 DIAGNOSIS — R296 Repeated falls: Secondary | ICD-10-CM

## 2020-12-26 MED ORDER — DONEPEZIL HCL 5 MG PO TABS
5.0000 mg | ORAL_TABLET | Freq: Every day | ORAL | 3 refills | Status: DC
Start: 2020-12-26 — End: 2021-09-10

## 2020-12-26 MED ORDER — CLOPIDOGREL BISULFATE 75 MG PO TABS
ORAL_TABLET | ORAL | 3 refills | Status: DC
Start: 2020-12-26 — End: 2021-09-10

## 2020-12-26 MED ORDER — BUPROPION HCL ER (XL) 150 MG PO TB24: ORAL_TABLET | ORAL | 3 refills | Status: DC

## 2020-12-26 MED ORDER — MEMANTINE HCL 10 MG PO TABS
ORAL_TABLET | ORAL | 3 refills | Status: DC
Start: 2020-12-26 — End: 2021-03-04

## 2020-12-26 NOTE — Patient Instructions (Signed)
1. Continue Donepezil 5mg  daily and Memantine 10mg  twice a day  2. Increase Bupropion 150mg : take 2 tablets in AM, 1 tablet in PM  3. Continue Plavix 75mg  daily, stop aspirin  4. Recommend driving evaluation: You can contact the following and compare cost:  [employer] in [place]  [place]  [facility] [phone] or [phone]  5. Refer to Physical Therapy for Balance therapy  6. Follow-up in 6-8 months, call for any changes   FALL PRECAUTIONS: Be cautious when walking. Scan the area for obstacles that may increase the risk of trips and falls. When getting up in the mornings, sit up at the edge of the bed for a few minutes before getting out of bed. Consider elevating the bed at the head end to avoid drop of blood pressure when getting up. Walk always in a well-lit room (use night lights in the walls). Avoid area rugs or power cords from appliances in the middle of the walkways. Use a walker or a cane if necessary and consider physical therapy for balance exercise. Get your eyesight checked regularly.  FINANCIAL OVERSIGHT: Supervision, especially oversight when making financial decisions or transactions is also recommended.  HOME SAFETY: Consider the safety of the kitchen when operating appliances like stoves, microwave oven, and blender. Consider having supervision and share cooking responsibilities until no longer able to participate in those. Accidents with firearms and other hazards in the house should be identified and addressed as well.  DRIVING: Regarding driving, in patients with progressive memory problems, driving will be impaired. We advise to have someone else do the driving if trouble finding directions or if minor accidents are reported. Independent driving assessment is available to determine safety of driving.  ABILITY TO BE LEFT ALONE: If patient is unable to  contact 911 operator, consider using LifeLine, or when the need is there, arrange for someone to stay with patients. Smoking is a fire hazard, consider supervision or cessation. Risk of wandering should be assessed by caregiver and if detected at any point, supervision and safe proof recommendations should be instituted.  MEDICATION SUPERVISION: Inability to self-administer medication needs to be constantly addressed. Implement a mechanism to ensure safe administration of the medications.  RECOMMENDATIONS FOR ALL PATIENTS WITH MEMORY PROBLEMS: 1. Continue to exercise (Recommend 30 minutes of walking everyday, or 3 hours every week) 2. Increase social interactions - continue going to [place] and enjoy social gatherings with friends and family 3. Eat healthy, avoid fried foods and eat more fruits and vegetables 4. Maintain adequate blood pressure, blood sugar, and blood cholesterol level. Reducing the risk of stroke and cardiovascular disease also helps promoting better memory. 5. Avoid stressful situations. Live a simple life and avoid aggravations. Organize your time and prepare for the next day in anticipation. 6. Sleep well, avoid any interruptions of sleep and avoid any distractions in the bedroom that may interfere with adequate sleep quality 7. Avoid sugar, avoid sweets as there is a strong link between excessive sugar intake, diabetes, and cognitive impairment The Mediterranean diet has been shown to help patients reduce the risk of progressive memory disorders and reduces cardiovascular risk. This includes eating fish, eat fruits and green leafy vegetables, nuts like almonds and hazelnuts, walnuts, and also use olive oil. Avoid fast foods and fried foods as much as possible. Avoid sweets and sugar as sugar use has been linked to worsening of memory function.  There is always a concern of  gradual progression of memory problems. If this is the case, then we may need to adjust level of care  according to patient needs. Support, both to the patient and caregiver, should then be put into place.

## 2020-12-26 NOTE — Progress Notes (Signed)
NEUROLOGY FOLLOW UP OFFICE NOTE  Kevin [name] [id number] December 21, 1945  HISTORY OF PRESENT ILLNESS: I had the pleasure of seeing Kevin [name] in follow-up in the neurology clinic on 12/26/2020.  The patient was last seen over a year ago for memory loss and dizziness. He is again accompanied by his wife who helps supplement the history today.  Records and images were personally reviewed where available. His wife contacted our office in March 2021 to report worsening memory, confusion, dizziness, headaches, and nausea. He was also having more falls. Infectious workup was negative. He had a head CT without contrast in April 2021 which showed a new hypoattenuation in the left occipital lobe concerning for new stroke. He had a CTA head and neck which showed high grade focal stenosis within the mid to distal basal arteries, PCAs are largely served by the anterior circulation. He was started on DAPT with aspirin and Plavix to take for 3 months, then Plavix alone, however he has continued to take both with no recurrence of these significant symptoms. He continues to have falls at least once a week, he states "I feel dizzy." He denies any lightheadedness or spinning sensation, the dizziness is more of his balance. He volunteers at [facility] 2 days a week and can work for 5 hours without any issues. He reports his hearing aids have stopped working and he has ringing in his ears. He has not been able to do Balance therapy. He states his memory is "not perfect." His wife reports worsening. He continues to drive and his wife reports he gets lost a lot, he usually calls her once a week asking for directions. His wife fixes his pillbox, he takes his own medications but occasionally forgets night doses and she reminds him. He manages finances but has paid bills twice, she is getting more involved. He thinks his mood is fine, his wife disagrees stating his mood has changed dramatically, he is very negative and  gets frustrated with himself because he does not remember. He is on Donepezil 5mg  daily (side effects on higher dose) and Memantine 10mg  BID without side effects. He is on Bupropion 150mg  2 tabs daily (300mg  daily). No hallucinations or paranoia, sleep is good.    History on Initial Assessment 07/14/2018: This is a pleasant 75 year old right-handed man with a history of hypertension, hyperlipidemia, diabetes, prostrate cancer, NICM, second degree AV block s/p PPM, presenting for evaluation of memory changes and dizziness. He and his wife disagree several times during the visit about what each other says. His wife reports dizziness started 2 years ago and that he complains about it daily to her. He states he does not have it everyday and does not think it's been going on for 2 years. It only occurs when standing, he is fine sitting/supine. Dizziness is described as lightheadedness, he used to play golf but stopped playing because he would transiently feel dizzy. He then reports that he tried to play golf 4-5 weeks ago and did not feel dizzy at all. His wife has checked his BP and glucose during these times and found them normal. He feels that when he takes his glasses off, the dizziness stops. He had a different type of dizziness the other night when he bent down to brush his teeth and started having a spinning sensation like he would fall. His wife helped him to sit on the commode and her threw up 3 times. Vertigo lasted 5 minutes. No associated focal symptoms. He  fell yesterday but states this is not a regular thing. He denies any headaches, diplopia, dysarthria/dysphagia, neck/back pain, focal numbness/tingling/weakness, bowel/bladder dysfunction, anosmia, or tremors. He has tinnitus and has seen ENT, he reports that chelated magnesium and turmeric help.  When asked about memory, he reports he gets frustrated trying to remember names, then it eventually comes to him. His wife started noticing changes 2 years  ago where he would repeat himself. She will be going back to work and the other day he asked her at least 5 times if she was working that day. He misplaces things frequently, having a bill or his checkbook and unable to recall where he lay it down. He volunteers at [facility] and could not remember someone's name yesterday. He continues to drive and denies getting lost, but his wife shakes her head and states he would be driving and forget how to get to his destination, which frustrates him since he has lived in [place] for a long time. They manage bills together, his wife denies any difficulties. His wife fixes his pillbox, he takes his medications overall fine but sometimes puts them in a packet and takes it later on. He is independent with dressing and bathing. His wife has noticed increased irritability, they have been married for 5 years. No paranoia or hallucinations. No wandering behavior, he sleeps well with melatonin. He takes prn Ativan only when "really frustrated." He is a retired Cytogeneticist. No family history of dementia. He was knocked down by shrapnel in [place]. He very seldom drinks alcohol. MMSE 27/30 in October 2018.  PAST MEDICAL HISTORY: Past Medical History:  Diagnosis Date  . Agent orange exposure    in [place]  . Anemia   . Heart block AV second degree    a. s/p PPM implant with subsequent CRTD upgrade  . High cholesterol   . History of colon polyps   . Hypertension   . LBBB (left bundle branch block)   . Nonischemic cardiomyopathy ([place])    a. MDT CRTD upgrade 2016  . Pacemaker 08/27/2013   Dual-chamber Medtronic Adapta implanted January 2013 for bradycardia with alternating bundle branch block and 2:1 atrioventricular block   . Prostate cancer ([place])    "not been treated yet; on a wait and see" (01/22/2015)  . Type II diabetes mellitus ([facility])     MEDICATIONS: Current Outpatient Medications on File Prior to Visit  Medication Sig Dispense Refill  . ACCU-CHEK COMPACT  PLUS test strip use 1 TEST STRIP once daily as directed 102 each 5  . aspirin EC 81 MG tablet Take 81 mg by mouth daily. Swallow whole.    Marland Kitchen atorvastatin (LIPITOR) 80 MG tablet Take 40 mg by mouth daily at 6 PM.    . buPROPion (WELLBUTRIN XL) 150 MG 24 hr tablet Take 2 tablets daily 180 tablet 3  . carvedilol (COREG) 12.5 MG tablet Take 6.25 mg by mouth 2 (two) times daily with a meal.    . CHELATED MAGNESIUM PO Take 250 mg by mouth daily.    . clobetasol ointment (TEMOVATE) 1.66 % Apply 1 application topically daily as needed (itching).     . clopidogrel (PLAVIX) 75 MG tablet TAKE 1 TABLET(75 MG) BY MOUTH DAILY 30 tablet 0  . Continuous Blood Gluc Receiver (FREESTYLE LIBRE 14 DAY READER) DEVI 1 each by Does not apply route See admin instructions. Use Freestyle Libre Reader to monitor blood sugars. 1 each 0  . Continuous Blood Gluc Sensor (FREESTYLE LIBRE 14 DAY  SENSOR) MISC Change every 14 days as directed or needed 6 each 2  . donepezil (ARICEPT) 5 MG tablet TAKE 1 TABLET BY MOUTH DAILY 30 tablet 0  . Empagliflozin-metFORMIN HCl ER (SYNJARDY XR) 03-999 MG TB24 Take 2 tablets by mouth daily. 60 tablet 2  . LORazepam (ATIVAN) 0.5 MG tablet Take 0.5 mg by mouth every 8 (eight) hours as needed for anxiety.     . memantine (NAMENDA) 10 MG tablet Take 1 tablet every night for 2 weeks, then increase to 1 tablet twice a day (Patient taking differently: Take 10 mg by mouth 2 (two) times daily.) 60 tablet 11  . VIAGRA 100 MG tablet TAKE ONE TABLET BY MOUTH AS NEEDED FOR ERECTILE DYSFUNCTION. (Patient taking differently: Take 50 mg by mouth as needed for erectile dysfunction.) 10 tablet 3   No current facility-administered medications on file prior to visit.    ALLERGIES: Allergies  Allergen Reactions  . Sulfa Antibiotics Swelling    Swelling of lips only.  Marland Kitchen 5-Alpha Reductase Inhibitors     FAMILY HISTORY: Family History  Problem Relation Age of Onset  . Ovarian cancer Mother   . Cancer Mother         ?uterine  . Leukemia Father   . Heart disease Father   . Diabetes Neg Hx     SOCIAL HISTORY: Social History   Socioeconomic History  . Marital status: Married    Spouse name: Not on file  . Number of children: 0  . Years of education: Not on file  . Highest education level: Not on file  Occupational History  . Occupation: Surveyor, minerals: [employer]  . Occupation: Retired  Tobacco Use  . Smoking status: Former Smoker    Packs/day: 1.00    Years: 20.00    Pack years: 20.00    Types: Cigarettes    Quit date: 04/13/1984    Years since quitting: 36.7  . Smokeless tobacco: Never Used  Vaping Use  . Vaping Use: Never used  Substance and Sexual Activity  . Alcohol use: Yes    Comment: 01/22/2015 "maybe 3 beers or glasses of wine /month"  . Drug use: No  . Sexual activity: Yes    Partners: Female  Other Topics Concern  . Not on file  Social History Narrative   Pt lives in [place] with spouse in 2 story home. He has three step children. 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 [place] Veteran. 14 years of education. Volunteer at [employer] 3 days a week. Right handed     Social Determinants of Health   Financial Resource Strain: Low Risk   . Difficulty of Paying Living Expenses: Not hard at all  Food Insecurity: No Food Insecurity  . Worried About Charity fundraiser in the Last Year: Never true  . Ran Out of Food in the Last Year: Never true  Transportation Needs: No Transportation Needs  . Lack of Transportation (Medical): No  . Lack of Transportation (Non-Medical): No  Physical Activity: Sufficiently Active  . Days of Exercise per Week: 2 days  . Minutes of Exercise per Session: 150+ min  Stress: No Stress Concern Present  . Feeling of Stress : Not at all  Social Connections: Socially Integrated  . Frequency of Communication with Friends and Family: More than three times a week  . Frequency of Social Gatherings with  Friends and Family: Three times a week  . Attends Religious  Services: More than 4 times per year  . Active Member of Clubs or Organizations: Yes  . Attends Archivist Meetings: More than 4 times per year  . Marital Status: Married  Human resources officer Violence: Not At Risk  . Fear of Current or Ex-Partner: No  . Emotionally Abused: No  . Physically Abused: No  . Sexually Abused: No     PHYSICAL EXAM: Vitals:   12/26/20 0926  BP: 118/73  Pulse: 78  SpO2: 96%   General: No acute distress Head:  Normocephalic/atraumatic Skin/Extremities: No rash, no edema Neurological Exam: alert and oriented to person, place, and day of week. States it is January 27, 2020 (it is Dec 26, 2020). No aphasia or dysarthria. Fund of knowledge is appropriate.  Recent and remote memory are mpaired.  Attention and concentration are reduced. MMSE 22/30 MMSE - Mini Mental State Exam 12/26/2020 09/21/2016 09/21/2016  Orientation to time 2 5 5   Orientation to Place 5 5 5   Registration 3 3 3   Attention/ Calculation 3 5 5   Recall 1 2 2   Language- name 2 objects 2 2 2   Language- repeat 1 1 1   Language- follow 3 step command 3 3 3   Language- read & follow direction 1 1 1   Write a sentence 1 1 -  Copy design 0 0 -  Total score 22 28 -   Cranial nerves: Pupils equal, round. Extraocular movements intact with no nystagmus. Visual fields full.  No facial asymmetry.  Motor: Bulk and tone normal, muscle strength 5/5 throughout with no pronator drift.   Finger to nose testing intact.  Gait narrow-based, no ataxia. Slight sway with Romberg test.   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 He is on DAPT and will stop aspirin, continue Plavix, control of vascular risk factors. MMSE today 22/30 (28/30 in 2017). Continue Donepezil 5mg  daily and Memantine 10mg  BID. Formal driving evaluation recommended, resources provided. We discussed mood changes that occur with memory changes, he will increase Bupropion 150mg  to 2 tabs in AM, 1 tab in PM. He continues to have frequent falls and will be referred for Balance Therapy. Continue close supervision. We discussed the importance of control of vascular risk factors, physical exercise, and brain stimulation exercises for brain health. Follow-up in 6-8 months, they know to call for any changes.   Thank you for allowing me to participate in his care.  Please do not hesitate to call for any questions or concerns.   Ellouise Newer, M.D.   CC: Dr. Elease Hashimoto

## 2020-12-26 NOTE — Progress Notes (Signed)
EPIC Encounter for ICM Monitoring  Patient Name: Kevin [name] is a 75 y.o. male Date: 12/26/2020 Primary Care Physican: [name], MD Primary Cardiologist:[name] Electrophysiologist: [name] Bi-V Pacing: 99.5% 12/03/2020 OfficeWeight:170lbs   Transmission reviewed.  OptivolThoracic impedance normal.  Labs: 11/12/2020 Creatinine 1.35, BUN 13, Potassium 4.4 Sodium 138, GFR 51.55 09/24/2020 Creatinine1.03, BUN15, Potassium4.3, Sodium140 08/15/2020 Creatinine1.33, BUN13, Potassium5.2, Sodium141, [id number] A complete set of results can be found in Results Review.  Patient does not take a diuretic.  Recommendations:No changes.  Follow-up plan: ICM clinic phone appointment on3/21/2022. 91 day device clinic remote transmission4/26/2022.   EP/Cardiology Office Visits: 12/17/2021 with Oda Kilts, [place].  Copy of ICM check sent to Dr.[name].  3 month ICM trend: 12/22/2020.    1 Year ICM trend:       Rosalene Billings, RN 12/26/2020 10:25 AM

## 2021-01-02 LAB — CUP PACEART INCLINIC DEVICE CHECK
Battery Remaining Longevity: 75 mo
Battery Voltage: 3.03 V
Brady Statistic AP VP Percent: 0.18 %
Brady Statistic AP VS Percent: 0.01 %
Brady Statistic AS VP Percent: 99.77 %
Brady Statistic AS VS Percent: 0.04 %
Brady Statistic RA Percent Paced: 0.19 %
Brady Statistic RV Percent Paced: 99.93 %
Date Time Interrogation Session: 20220126151500
HighPow Impedance: 82 Ohm
Implantable Lead Implant Date: 20130123
Implantable Lead Implant Date: 20160316
Implantable Lead Implant Date: 20160316
Implantable Lead Location: 753858
Implantable Lead Location: 753859
Implantable Lead Location: 753860
Implantable Lead Model: 4598
Implantable Lead Model: 5076
Implantable Lead Model: 6935
Implantable Pulse Generator Implant Date: 20211021
Lead Channel Impedance Value: 1121 Ohm
Lead Channel Impedance Value: 1178 Ohm
Lead Channel Impedance Value: 1197 Ohm
Lead Channel Impedance Value: 249.509
Lead Channel Impedance Value: 257.018
Lead Channel Impedance Value: 279.484
Lead Channel Impedance Value: 312.507
Lead Channel Impedance Value: 324.377
Lead Channel Impedance Value: 399 Ohm
Lead Channel Impedance Value: 418 Ohm
Lead Channel Impedance Value: 456 Ohm
Lead Channel Impedance Value: 456 Ohm
Lead Channel Impedance Value: 551 Ohm
Lead Channel Impedance Value: 589 Ohm
Lead Channel Impedance Value: 665 Ohm
Lead Channel Impedance Value: 722 Ohm
Lead Channel Impedance Value: 931 Ohm
Lead Channel Impedance Value: 931 Ohm
Lead Channel Pacing Threshold Amplitude: 0.5 V
Lead Channel Pacing Threshold Amplitude: 0.75 V
Lead Channel Pacing Threshold Amplitude: 2.25 V
Lead Channel Pacing Threshold Pulse Width: 0.4 ms
Lead Channel Pacing Threshold Pulse Width: 0.4 ms
Lead Channel Pacing Threshold Pulse Width: 1 ms
Lead Channel Sensing Intrinsic Amplitude: 2 mV
Lead Channel Sensing Intrinsic Amplitude: 3.4 mV
Lead Channel Setting Pacing Amplitude: 1.5 V
Lead Channel Setting Pacing Amplitude: 2.5 V
Lead Channel Setting Pacing Amplitude: 3 V
Lead Channel Setting Pacing Pulse Width: 0.4 ms
Lead Channel Setting Pacing Pulse Width: 1 ms
Lead Channel Setting Sensing Sensitivity: 0.3 mV

## 2021-01-22 ENCOUNTER — Telehealth: Payer: Self-pay | Admitting: Emergency Medicine

## 2021-01-22 NOTE — Telephone Encounter (Signed)
Attempted to call patient as requested by myChart message but mailbox was full.  Patient requested pacemaker programming be changed back to settings before gen change. No change in settings made at gen change or after.

## 2021-01-26 ENCOUNTER — Ambulatory Visit (INDEPENDENT_AMBULATORY_CARE_PROVIDER_SITE_OTHER): Payer: Medicare HMO

## 2021-01-26 DIAGNOSIS — I5042 Chronic combined systolic (congestive) and diastolic (congestive) heart failure: Secondary | ICD-10-CM

## 2021-01-26 DIAGNOSIS — Z95810 Presence of automatic (implantable) cardiac defibrillator: Secondary | ICD-10-CM | POA: Diagnosis not present

## 2021-01-26 NOTE — Telephone Encounter (Signed)
Spoke with patient,  He reports for past week he has experienced jumping sensation in his upper stomach, it is sometimes visible.  It does impact his daily activities.  Office closing soon/ pt unable to come in today for testing.  Scheduled for appt tomorrow morning in Device clinic.   Message sent to request industry assistance with testing/ reprogramming.

## 2021-01-27 NOTE — Progress Notes (Signed)
EPIC Encounter for ICM Monitoring  Patient Name: Kevin [name] is a 75 y.o. male Date: 01/27/2021 Primary Care Physican: [name], MD Primary Cardiologist:[name] Electrophysiologist: [name] Bi-V Pacing: 99.7% 12/03/2020 OfficeWeight:170lbs   Transmission reviewed.  OptivolThoracic impedance normal.  Labs: 11/12/2020 Creatinine 1.35, BUN 13, Potassium 4.4 Sodium 138, GFR 51.55 09/24/2020 Creatinine1.03, BUN15, Potassium4.3, Sodium140 08/15/2020 Creatinine1.33, BUN13, Potassium5.2, Sodium141, [id number] A complete set of results can be found in Results Review.  Patient does not take a diuretic.  Recommendations:No changes.  Follow-up plan: ICM clinic phone appointment on4/27/2022. 91 day device clinic remote transmission4/26/2022.   EP/Cardiology Office Visits: Recall 12/17/2021 with Oda Kilts, PA.  Copy of ICM check sent to Dr.[name].  3 month ICM trend: 01/26/2021.    1 Year ICM trend:       Rosalene Billings, RN 01/27/2021 9:54 AM

## 2021-02-13 ENCOUNTER — Other Ambulatory Visit: Payer: Medicare HMO

## 2021-02-19 ENCOUNTER — Ambulatory Visit: Payer: Medicare HMO | Admitting: Endocrinology

## 2021-02-20 ENCOUNTER — Other Ambulatory Visit: Payer: Medicare HMO

## 2021-02-26 ENCOUNTER — Ambulatory Visit (INDEPENDENT_AMBULATORY_CARE_PROVIDER_SITE_OTHER): Payer: Medicare HMO | Admitting: Emergency Medicine

## 2021-02-26 ENCOUNTER — Other Ambulatory Visit: Payer: Self-pay

## 2021-02-26 DIAGNOSIS — I428 Other cardiomyopathies: Secondary | ICD-10-CM

## 2021-02-27 LAB — CUP PACEART INCLINIC DEVICE CHECK
Battery Remaining Longevity: 75 mo
Battery Voltage: 3.01 V
Brady Statistic AP VP Percent: 0.1 %
Brady Statistic AP VS Percent: 0.01 %
Brady Statistic AS VP Percent: 99.69 %
Brady Statistic AS VS Percent: 0.2 %
Brady Statistic RA Percent Paced: 0.11 %
Brady Statistic RV Percent Paced: 99.74 %
Date Time Interrogation Session: 20220421200500
HighPow Impedance: 84 Ohm
Implantable Lead Implant Date: 20130123
Implantable Lead Implant Date: 20160316
Implantable Lead Implant Date: 20160316
Implantable Lead Location: 753858
Implantable Lead Location: 753859
Implantable Lead Location: 753860
Implantable Lead Model: 4598
Implantable Lead Model: 5076
Implantable Lead Model: 6935
Implantable Pulse Generator Implant Date: 20211021
Lead Channel Impedance Value: 1121 Ohm
Lead Channel Impedance Value: 1121 Ohm
Lead Channel Impedance Value: 1178 Ohm
Lead Channel Impedance Value: 249.509
Lead Channel Impedance Value: 249.509
Lead Channel Impedance Value: 279.484
Lead Channel Impedance Value: 312.507
Lead Channel Impedance Value: 312.507
Lead Channel Impedance Value: 418 Ohm
Lead Channel Impedance Value: 418 Ohm
Lead Channel Impedance Value: 456 Ohm
Lead Channel Impedance Value: 475 Ohm
Lead Channel Impedance Value: 551 Ohm
Lead Channel Impedance Value: 551 Ohm
Lead Channel Impedance Value: 665 Ohm
Lead Channel Impedance Value: 722 Ohm
Lead Channel Impedance Value: 950 Ohm
Lead Channel Impedance Value: 950 Ohm
Lead Channel Pacing Threshold Amplitude: 0.5 V
Lead Channel Pacing Threshold Amplitude: 0.75 V
Lead Channel Pacing Threshold Amplitude: 1.25 V
Lead Channel Pacing Threshold Pulse Width: 0.4 ms
Lead Channel Pacing Threshold Pulse Width: 0.4 ms
Lead Channel Pacing Threshold Pulse Width: 1 ms
Lead Channel Sensing Intrinsic Amplitude: 3.5 mV
Lead Channel Setting Pacing Amplitude: 1.5 V
Lead Channel Setting Pacing Amplitude: 2 V
Lead Channel Setting Pacing Amplitude: 2.5 V
Lead Channel Setting Pacing Pulse Width: 0.4 ms
Lead Channel Setting Pacing Pulse Width: 1 ms
Lead Channel Setting Sensing Sensitivity: 0.3 mV

## 2021-02-27 NOTE — Progress Notes (Deleted)
Loop check in clinic for reprogramming per JA secondary to frequent atrial etopy. Battery status: OK. R-waves 0.33mV. 0 symptom episodes, 0 tachy episodes, 0 pause episodes, 0 brady episodes. 18 AF episodes (0% burden) since 01/17/21. + Eliquis. Device adjusted to least sensitive, and episode length reprogrammed to > 6 min per industry. Monthly summary reports and ROV with JA. Error in saving attachment.

## 2021-02-27 NOTE — Progress Notes (Signed)
CRT-D device check in office to evaluate possible PNS. Thresholds and sensing consistent with previous device measurements. Lead impedance trends stable over time. No mode switch episodes recorded. No ventricular arrhythmia episodes recorded. Patient bi-ventricularly pacing 99.7% of the time. Device programmed with appropriate safety margins. Heart failure diagnostics reviewed and trends increasing but remains at baseline. HF action plan and low sodium diet reviewed. Audible alerts demonstrated for patient. PNS intermittantly reproducible with patient laying on right side. LV vector reprogrammed by industry from LV1 to LV4 to LV-ring4 to RV-Coil with threshold@ 1.25V@1 .39ms. Estimated longevity remains at 6y 3 months.  Patient enrolled in remote follow up with next transmission 03/03/21. Patient education completed including shock plan. Error in saving attachments with industry. See scanned report in epic

## 2021-03-02 ENCOUNTER — Other Ambulatory Visit: Payer: Self-pay | Admitting: Internal Medicine

## 2021-03-03 ENCOUNTER — Other Ambulatory Visit: Payer: Self-pay | Admitting: Neurology

## 2021-03-03 ENCOUNTER — Ambulatory Visit (INDEPENDENT_AMBULATORY_CARE_PROVIDER_SITE_OTHER): Payer: Medicare HMO

## 2021-03-03 DIAGNOSIS — I495 Sick sinus syndrome: Secondary | ICD-10-CM

## 2021-03-03 LAB — CUP PACEART REMOTE DEVICE CHECK
Battery Remaining Longevity: 77 mo
Battery Voltage: 3.01 V
Brady Statistic AP VP Percent: 0.89 %
Brady Statistic AP VS Percent: 0.01 %
Brady Statistic AS VP Percent: 99.09 %
Brady Statistic AS VS Percent: 0 %
Brady Statistic RA Percent Paced: 0.9 %
Brady Statistic RV Percent Paced: 99.98 %
Date Time Interrogation Session: 20220426072307
HighPow Impedance: 81 Ohm
Implantable Lead Implant Date: 20130123
Implantable Lead Implant Date: 20160316
Implantable Lead Implant Date: 20160316
Implantable Lead Location: 753858
Implantable Lead Location: 753859
Implantable Lead Location: 753860
Implantable Lead Model: 4598
Implantable Lead Model: 5076
Implantable Lead Model: 6935
Implantable Pulse Generator Implant Date: 20211021
Lead Channel Impedance Value: 1026 Ohm
Lead Channel Impedance Value: 1064 Ohm
Lead Channel Impedance Value: 255.093
Lead Channel Impedance Value: 255.093
Lead Channel Impedance Value: 266.667
Lead Channel Impedance Value: 289.049
Lead Channel Impedance Value: 289.049
Lead Channel Impedance Value: 399 Ohm
Lead Channel Impedance Value: 399 Ohm
Lead Channel Impedance Value: 456 Ohm
Lead Channel Impedance Value: 475 Ohm
Lead Channel Impedance Value: 551 Ohm
Lead Channel Impedance Value: 551 Ohm
Lead Channel Impedance Value: 608 Ohm
Lead Channel Impedance Value: 646 Ohm
Lead Channel Impedance Value: 893 Ohm
Lead Channel Impedance Value: 893 Ohm
Lead Channel Impedance Value: 988 Ohm
Lead Channel Pacing Threshold Amplitude: 0.5 V
Lead Channel Pacing Threshold Amplitude: 0.5 V
Lead Channel Pacing Threshold Amplitude: 1.125 V
Lead Channel Pacing Threshold Pulse Width: 0.4 ms
Lead Channel Pacing Threshold Pulse Width: 0.4 ms
Lead Channel Pacing Threshold Pulse Width: 1 ms
Lead Channel Sensing Intrinsic Amplitude: 3.875 mV
Lead Channel Sensing Intrinsic Amplitude: 3.875 mV
Lead Channel Setting Pacing Amplitude: 1.5 V
Lead Channel Setting Pacing Amplitude: 1.75 V
Lead Channel Setting Pacing Amplitude: 2.5 V
Lead Channel Setting Pacing Pulse Width: 0.4 ms
Lead Channel Setting Pacing Pulse Width: 1 ms
Lead Channel Setting Sensing Sensitivity: 0.3 mV

## 2021-03-04 ENCOUNTER — Other Ambulatory Visit: Payer: Self-pay | Admitting: Neurology

## 2021-03-04 ENCOUNTER — Ambulatory Visit (INDEPENDENT_AMBULATORY_CARE_PROVIDER_SITE_OTHER): Payer: Medicare HMO

## 2021-03-04 DIAGNOSIS — Z95810 Presence of automatic (implantable) cardiac defibrillator: Secondary | ICD-10-CM | POA: Diagnosis not present

## 2021-03-04 DIAGNOSIS — I5042 Chronic combined systolic (congestive) and diastolic (congestive) heart failure: Secondary | ICD-10-CM | POA: Diagnosis not present

## 2021-03-05 ENCOUNTER — Other Ambulatory Visit: Payer: Self-pay | Admitting: Internal Medicine

## 2021-03-06 NOTE — Progress Notes (Signed)
EPIC Encounter for ICM Monitoring  Patient Name: Kevin [name] is a 75 y.o. male Date: 03/06/2021 Primary Care Physican: [name], MD Primary Cardiologist:[name] Electrophysiologist: [name] Bi-V Pacing: 99.7% 1/26/2022OfficeWeight:170lbs   Transmission reviewed and results sent via mychart.  OptivolThoracic impedance normal.    Labs: 11/12/2020 Creatinine 1.35, BUN 13, Potassium 4.4 Sodium 138, GFR 51.55 09/24/2020 Creatinine1.03, BUN15, Potassium4.3, Sodium140 08/15/2020 Creatinine1.33, BUN13, Potassium5.2, Sodium141, [id number] A complete set of results can be found in Results Review.  Patient does not take a diuretic.  Recommendations:No changes.  Follow-up plan: ICM clinic phone appointment on6/04/2021. 91 day device clinic remote transmission7/26/2022.   EP/Cardiology Office Visits: Recall 12/17/2021 with Oda Kilts, PA.  Copy of ICM check sent to Dr.[name].  3 month ICM trend: 03/03/2021.    1 Year ICM trend:       Rosalene Billings, RN 03/06/2021 11:46 AM

## 2021-03-10 ENCOUNTER — Telehealth: Payer: Self-pay | Admitting: Pharmacist

## 2021-03-10 NOTE — Chronic Care Management (AMB) (Signed)
    Chronic Care Management Pharmacy Assistant   Name: Kevin [name]  MRN: [id number] DOB: 07/25/1946   Called and spoke with patient and reminded him of his follow up visit with Jeni Salles the clinical pharmacist on 03/11/21 at 1pm. Patient verbalized understanding and will have his medications as well as any blood pressure or blood glucose reading close by as well.   [place] [phone]

## 2021-03-11 ENCOUNTER — Telehealth: Payer: Medicare HMO

## 2021-03-11 NOTE — Progress Notes (Deleted)
Chronic Care Management Pharmacy Note  03/11/2021 Name:  Kevin [name] MRN:  [id number] DOB:  Oct 04, 1946  Subjective: Kevin [name] is an 75 y.o. year old male who is a primary patient of Burchette, Alinda Sierras, MD.  The CCM team was consulted for assistance with disease management and care coordination needs.    Engaged with patient by telephone for follow up visit in response to provider referral for pharmacy case management and/or care coordination services.   Consent to Services:  The patient was given information about Chronic Care Management services, agreed to services, and gave verbal consent prior to initiation of services.  Please see initial visit note for detailed documentation.   Patient Care Team: [name], MD as PCP - General (Family Medicine) Cameron Sprang, MD as Consulting Physician (Neurology) Viona Gilmore, [facility] as Pharmacist (Pharmacist)  Recent office visits: 11/05/20 Carolann Littler, MD: Patient presented for annual exam. Prevnar 13 administered.   Recent consult visits: 03/04/21 Sharman Cheek, RN (cardiology): Patient presented for device check.  02/05/21 Patient presented to the [facility] for CKD follow up. Unintentional weight loss noted and weakness. BP is low.   12/26/20 Ellouise Newer, MD (neurology): Patient presented for memory follow up. Recommended stopping aspirin and continuing with Plavix only. Increased Wellbutrin to 2 tabs in AM and 1 tab in PM.   12/03/20 [name], MD (cardiology): Patient presented for HF follow up.  11/20/20 Elayne Snare, MD (endocrinology): Patient presented for diabetes follow up. No changes made.  Hospital visits: None in previous 6 months  Objective:  Lab Results  Component Value Date   CREATININE 1.35 11/12/2020   BUN 13 11/12/2020   GFR 51.55 (L) 11/12/2020   GFRNONAA 52 (L) 08/15/2020   GFRAA 60 08/15/2020   NA 138 11/12/2020   K 4.4 11/12/2020   CALCIUM 9.1 11/12/2020   CO2 28 11/12/2020    GLUCOSE 217 (H) 11/12/2020    Lab Results  Component Value Date/Time   HGBA1C 6.1 (A) 09/24/2020 02:20 PM   HGBA1C 6.5 11/30/2019 11:58 AM   HGBA1C 6.7 (H) 07/20/2019 08:48 AM   FRUCTOSAMINE 272 11/12/2020 01:42 PM   FRUCTOSAMINE 255 02/02/2017 09:30 AM   GFR 51.55 (L) 11/12/2020 01:42 PM   GFR 71.39 09/24/2020 02:27 PM   MICROALBUR 2.5 (H) 09/24/2020 02:27 PM   MICROALBUR 2.3 (H) 07/20/2019 08:48 AM    Last diabetic Eye exam: No results found for: HMDIABEYEEXA  Last diabetic Foot exam: No results found for: HMDIABFOOTEX   Lab Results  Component Value Date   CHOL 103 09/24/2020   HDL 39.10 09/24/2020   LDLCALC 39 09/24/2020   LDLDIRECT 58.6 09/24/2014   TRIG 126.0 09/24/2020   CHOLHDL 3 09/24/2020    Hepatic Function Latest Ref Rng & Units 09/24/2020 02/27/2019 07/24/2018  Total Protein 6.0 - 8.3 g/dL 7.3 7.1 7.4  Albumin 3.5 - 5.2 g/dL 4.0 4.1 4.2  AST 0 - 37 U/L 25 18 15   ALT 0 - 53 U/L 15 11 6   Alk Phosphatase 39 - 117 U/L 75 63 66  Total Bilirubin 0.2 - 1.2 mg/dL 0.6 0.7 0.7  Bilirubin, Direct 0.0 - 0.3 mg/dL - - -    Lab Results  Component Value Date/Time   TSH 0.49 06/06/2019 01:42 PM   TSH 0.56 09/02/2017 03:51 PM    CBC Latest Ref Rng & Units 08/15/2020 02/06/2020 06/06/2019  WBC 3.4 - 10.8 x10E3/uL 7.7 8.3 6.0  Hemoglobin 13.0 - 17.7 g/dL 16.3 14.7  14.8  Hematocrit 37.5 - 51.0 % 47.5 43.0 44.2  Platelets 150 - 450 x10E3/uL 192 167.0 147.0(L)    No results found for: VD25OH  Clinical ASCVD: Yes  The ASCVD Risk score Mikey Bussing DC Jr., et al., 2013) failed to calculate for the following reasons:   The patient has a prior MI or stroke diagnosis    Depression screen [facility] 2/9 06/04/2020 03/03/2018 09/21/2016  Decreased Interest 0 0 0  Down, Depressed, Hopeless 0 0 0  PHQ - 2 Score 0 0 0  Some recent data might be hidden     ***Other: (CHADS2VASc if Afib, MMRC or CAT for COPD, ACT, DEXA)  Social History   Tobacco Use  Smoking Status Former Smoker  .  Packs/day: 1.00  . Years: 20.00  . Pack years: 20.00  . Types: Cigarettes  . Quit date: 04/13/1984  . Years since quitting: 36.9  Smokeless Tobacco Never Used   BP Readings from Last 3 Encounters:  12/26/20 118/73  12/03/20 122/68  11/20/20 124/78   Pulse Readings from Last 3 Encounters:  12/26/20 78  12/03/20 (!) 102  11/20/20 76   Wt Readings from Last 3 Encounters:  12/26/20 170 lb 6.4 oz (77.3 kg)  12/03/20 170 lb 6.4 oz (77.3 kg)  11/20/20 173 lb (78.5 kg)   BMI Readings from Last 3 Encounters:  12/26/20 21.30 kg/m  12/03/20 21.30 kg/m  11/20/20 21.62 kg/m    Assessment/Interventions: Review of patient past medical history, allergies, medications, health status, including review of consultants reports, laboratory and other test data, was performed as part of comprehensive evaluation and provision of chronic care management services.   SDOH:  (Social Determinants of Health) assessments and interventions performed: {yes/no:20286}  SDOH Screenings   Alcohol Screen: Not on file  Depression (PHQ2-9): Low Risk   . PHQ-2 Score: 0  Financial Resource Strain: Low Risk   . Difficulty of Paying Living Expenses: Not hard at all  Food Insecurity: No Food Insecurity  . Worried About Charity fundraiser in the Last Year: Never true  . Ran Out of Food in the Last Year: Never true  Housing: Low Risk   . Last Housing Risk Score: 0  Physical Activity: Sufficiently Active  . Days of Exercise per Week: 2 days  . Minutes of Exercise per Session: 150+ min  Social Connections: Socially Integrated  . Frequency of Communication with Friends and Family: More than three times a week  . Frequency of Social Gatherings with Friends and Family: Three times a week  . Attends Religious Services: More than 4 times per year  . Active Member of Clubs or Organizations: Yes  . Attends Archivist Meetings: More than 4 times per year  . Marital Status: Married  Stress: No Stress Concern  Present  . Feeling of Stress : Not at all  Tobacco Use: Medium Risk  . Smoking Tobacco Use: Former Smoker  . Smokeless Tobacco Use: Never Used  Transportation Needs: No Transportation Needs  . Lack of Transportation (Medical): No  . Lack of Transportation (Non-Medical): No    CCM Care Plan  Allergies  Allergen Reactions  . Sulfa Antibiotics Swelling    Swelling of lips only.  Marland Kitchen 5-Alpha Reductase Inhibitors     Medications Reviewed Today    Reviewed by Cameron Sprang, MD (Physician) on 12/26/20 at [phone]  Med List Status: <None>  Medication Order Taking? Sig Documenting Provider Last Dose Status Informant  ACCU-CHEK COMPACT PLUS test strip [id number]  Yes use 1 TEST STRIP once daily as directed Elayne Snare, MD Taking Active Spouse/Significant Other  aspirin EC 81 MG tablet [id number] Yes Take 81 mg by mouth daily. Swallow whole. [provider] Taking Active Spouse/Significant Other  atorvastatin (LIPITOR) 80 MG tablet [id number] Yes Take 40 mg by mouth daily at 6 PM. [provider] Taking Active Spouse/Significant Other  buPROPion (WELLBUTRIN XL) 150 MG 24 hr tablet [id number] Yes Take 2 tablets daily Cameron Sprang, MD Taking Active   carvedilol (COREG) 12.5 MG tablet [id number] Yes Take 6.25 mg by mouth 2 (two) times daily with a meal. [provider] Taking Active Spouse/Significant Other  CHELATED MAGNESIUM PO [id number] Yes Take 250 mg by mouth daily. [provider] Taking Active Spouse/Significant Other  clobetasol ointment (TEMOVATE) 0.05 % [id number] Yes Apply 1 application topically daily as needed (itching).  [provider] Taking Active Spouse/Significant Other           Med Note Finis Bud Jan 20, 2015 10:50 AM)    clopidogrel (PLAVIX) 75 MG tablet [id number] Yes TAKE 1 TABLET(75 MG) BY MOUTH DAILY Cameron Sprang, MD Taking Active   Continuous Blood Gluc Receiver (FREESTYLE LIBRE 14 DAY READER) DEVI [id number] Yes 1  each by Does not apply route See admin instructions. Use Freestyle Libre Reader to monitor blood sugars. Elayne Snare, MD Taking Active   Continuous Blood Gluc Sensor (FREESTYLE LIBRE [address] [place]) [place] [id number] Yes Change every 14 days as directed or needed Elayne Snare, MD Taking Active   donepezil (ARICEPT) 5 MG tablet [id number] Yes TAKE 1 TABLET BY MOUTH DAILY Cameron Sprang, MD Taking Active   Empagliflozin-metFORMIN HCl ER (SYNJARDY XR) 03-999 MG TB24 [id number] Yes Take 2 tablets by mouth daily. Elayne Snare, MD Taking Active   LORazepam (ATIVAN) 0.5 MG tablet [id number] Yes Take 0.5 mg by mouth every 8 (eight) hours as needed for anxiety.  [provider] Taking Active Spouse/Significant Other  memantine (NAMENDA) 10 MG tablet [id number] Yes Take 1 tablet every night for 2 weeks, then increase to 1 tablet twice a day  Patient taking differently: Take 10 mg by mouth 2 (two) times daily.   Cameron Sprang, MD Taking Active   VIAGRA 100 MG tablet [id number] Yes TAKE ONE TABLET BY MOUTH AS NEEDED FOR ERECTILE DYSFUNCTION.  Patient taking differently: Take 50 mg by mouth as needed for erectile dysfunction.   [name], MD Taking Active           Patient Active Problem List   Diagnosis Date Noted  . Chronic combined systolic and diastolic heart failure ([place]) 09/28/2017  . 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 in situ 09/28/2017  . Hx of adenomatous colonic polyps 10/20/2016  . Malignant neoplasm of prostate ([place]) 01/19/2016  . Chronic systolic dysfunction of left ventricle 01/22/2015  . Left bundle branch block 11/24/2014  . Sick sinus syndrome ([place]) 11/24/2014  . Nonischemic cardiomyopathy ([place]) 11/23/2013  . Ventricular tachycardia ([place]) 11/23/2013  . DM2 (diabetes mellitus, type 2) ([place]) 08/27/2013  . Hyperlipidemia 08/27/2013  . Condyloma 05/14/2013  . Penile abnormality 05/05/2013  . 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/01/2013  . Penile mass  02/19/2013  . Hypertension 10/16/2012  . Rectal discharge 07/14/2012  . Jaw pain 07/14/2012  . 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 Insomnia 04/16/2011  . Anxiety 04/16/2011    Immunization History  Administered Date(s) Administered  . DTaP 11/15/2008  . Influenza Nasal 08/25/2020  . Influenza  Split 09/20/2012  . Influenza, High Dose Seasonal PF 07/20/2019  . Influenza,inj,Quad PF,6+ Mos 08/23/2013, 08/29/2014  . Influenza-Unspecified 08/13/2015, 07/03/2016, 08/02/2018  . Pneumococcal Conjugate-13 11/05/2020  . Pneumococcal-Unspecified 08/08/2014  . Td 11/15/2008    Conditions to be addressed/monitored:  Hypertension, Hyperlipidemia, Diabetes, Heart Failure, Depression, Anxiety and Memory loss  There are no care plans that you recently modified to display for this patient.  Current Barriers:  . {pharmacybarriers:24917}  Pharmacist Clinical Goal(s):  Marland Kitchen Patient will {PHARMACYGOALCHOICES:24921} through collaboration with PharmD and provider.   Interventions: . 1:1 collaboration with [name], MD regarding development and update of comprehensive plan of care as evidenced by provider attestation and co-signature . Inter-disciplinary care team collaboration (see longitudinal plan of care) . Comprehensive medication review performed; medication list updated in electronic medical record  BP Readings from Last 3 Encounters:  12/26/20 118/73  12/03/20 122/68  11/20/20 124/78     Hypertension (BP goal <140/90) -Controlled -Current treatment:  Carvedilol 12.47m, 0.5 tablet twice daily   -Medications previously tried: ***  -Current home readings: *** -Current dietary habits: *** - patient does use a salt substitute for less sodium -Current exercise habits: *** -{ACTIONS;DENIES/REPORTS:21021675::"Denies"} hypotensive/hypertensive symptoms -Educated on {CCM BP Counseling:25124} -Counseled to monitor BP at home ***, document, and provide log at future  appointments -{CCMPHARMDINTERVENTION:25122}  Lab Results  Component Value Date   CHOL 103 09/24/2020   HDL 39.10 09/24/2020   LDLCALC 39 09/24/2020   LDLDIRECT 58.6 09/24/2014   TRIG 126.0 09/24/2020   CHOLHDL 3 09/24/2020    Hyperlipidemia: (LDL goal < 70) -Controlled -Current treatment: . Atorvastatin 80 mg 1 tablet daily -Medications previously tried: ***  -Current dietary patterns: *** -Current exercise habits: *** -Educated on {CCM HLD Counseling:25126} -{CCMPHARMDINTERVENTION:25122}   Intracranial stenosis (Goal: ***) -{US controlled/uncontrolled:25276} -Current treatment   Clopidogrel 773m 1 tablet once daily -Medications previously tried: aspirin (completed course) -{CCMPHARMDINTERVENTION:25122}   Diabetes (A1c goal <7%) -Controlled -Current medications: . Synjardy 03-999 mg 1 tablet twice daily -Medications previously tried: Janumet  -Current home glucose readings . fasting glucose: *** . post prandial glucose: *** -{ACTIONS;DENIES/REPORTS:21021675::"Denies"} hypoglycemic/hyperglycemic symptoms -Current meal patterns:  . breakfast: ***  . lunch: ***  . dinner: *** . snacks: *** . drinks: *** -Current exercise: *** -Educated on {CCM DM COUNSELING:25123} -Counseled to check feet daily and get yearly eye exams -{CCMPHARMDINTERVENTION:25122}  -scanning better with freestyle libre?  Heart Failure (Goal: manage symptoms and prevent exacerbations) -{US controlled/uncontrolled:25276} -Last ejection fraction: 40% (Date: 08/07/13) -HF type: Combined Systolic and Diastolic -NYHA Class: II (slight limitation of activity) -AHA HF Stage: C (Heart disease and symptoms present) -Current treatment:  Carvedilol 12.63m363m0.5 tablet twice daily   -Medications previously tried: lisinopril, losartan, spironolactone  -Current home BP/HR readings: *** -Current dietary habits: *** -Current exercise habits: *** -Educated on {CCM HF  Counseling:25125} -{CCMPHARMDINTERVENTION:25122}  Depression/Anxiety (Goal: ***) -{US controlled/uncontrolled:25276} -Current treatment:  Bupropion (Wellbutrin XL) 150m30m tablets in the morning and 1 tablet in the evening  -did we not increase?  Lorazepam 0.63mg,21mtablet every eight hours as needed -Medications previously tried/failed: Buspirone -PHQ9: *** -GAD7: *** -Connected with *** for mental health support -Educated on {CCM mental health counseling:25127} -{CCMPHARMDINTERVENTION:25122}  Memory loss (Goal: ***) -{US controlled/uncontrolled:25276} -Current treatment   Donepezil 63mg, 51mablet once daily  memantine (Namenda) 10mg, 18mblet twice a day -Medications previously tried: ***  -{CCMPHARMDINTERVENTION:25122}  Vaccines   Reviewed and discussed patient's vaccination history.    Immunization History  Administered Date(s) Administered  .  DTaP 11/15/2008  . Influenza Nasal 08/25/2020  . Influenza Split 09/20/2012  . Influenza, High Dose Seasonal PF 07/20/2019  . Influenza,inj,Quad PF,6+ Mos 08/23/2013, 08/29/2014  . Influenza-Unspecified 08/13/2015, 07/03/2016, 08/02/2018  . Pneumococcal Conjugate-13 11/05/2020  . Pneumococcal-Unspecified 08/08/2014  . Td 11/15/2008    Plan  Recommended patient receive *** vaccine in *** office.   Health Maintenance -Vaccine gaps: shingles, tetanus - did he ask [facility] about?, COVID booster? -Current therapy:   Chelated magnesium 273m, 1 tablet once daily   Clobetasol ointment 0.05%, apply as needed for itching -Educated on {ccm supplement counseling:25128} -{CCM Patient satisfied:25129} -{CCMPHARMDINTERVENTION:25122}   Patient Goals/Self-Care Activities . Patient will:  - {pharmacypatientgoals:24919}  Follow Up Plan: {CM FOLLOW UP PGRMB:01499}   Medication Assistance: {MEDASSISTANCEINFO:25044}  Patient[place] pharmacy is:  [place] [address]Phone: [phone]: [phone] [facility]CS[address]Phone: [phone]: [phone] Uses pill box? No - wife organizes his medications Pt endorses ***% compliance  We discussed: {Pharmacy options:24294} Patient decided to: {US Pharmacy PFMZU:40459} Care Plan and Follow Up Patient Decision:  {FOLLOWUP:24991}  Plan: {CM FOLLOW UP PPLWU:59923} MJeni Salles PharmD BThurstonPharmacist [place]Trim[place]ake [place]

## 2021-03-13 ENCOUNTER — Encounter: Payer: Self-pay | Admitting: Family Medicine

## 2021-03-13 ENCOUNTER — Other Ambulatory Visit: Payer: Self-pay

## 2021-03-13 ENCOUNTER — Ambulatory Visit (INDEPENDENT_AMBULATORY_CARE_PROVIDER_SITE_OTHER): Payer: Medicare HMO | Admitting: Family Medicine

## 2021-03-13 VITALS — BP 124/70 | HR 89 | Temp 97.8°F | Wt 161.4 lb

## 2021-03-13 DIAGNOSIS — R634 Abnormal weight loss: Secondary | ICD-10-CM

## 2021-03-13 DIAGNOSIS — L723 Sebaceous cyst: Secondary | ICD-10-CM | POA: Diagnosis not present

## 2021-03-13 LAB — CBC WITH DIFFERENTIAL/PLATELET
Basophils Absolute: 0 10*3/uL (ref 0.0–0.1)
Basophils Relative: 0.5 % (ref 0.0–3.0)
Eosinophils Absolute: 0.1 10*3/uL (ref 0.0–0.7)
Eosinophils Relative: 1.6 % (ref 0.0–5.0)
HCT: 49 % (ref 39.0–52.0)
Hemoglobin: 16.5 g/dL (ref 13.0–17.0)
Lymphocytes Relative: 14 % (ref 12.0–46.0)
Lymphs Abs: 0.9 10*3/uL (ref 0.7–4.0)
MCHC: 33.7 g/dL (ref 30.0–36.0)
MCV: 98.2 fl (ref 78.0–100.0)
Monocytes Absolute: 0.6 10*3/uL (ref 0.1–1.0)
Monocytes Relative: 8.5 % (ref 3.0–12.0)
Neutro Abs: 4.9 10*3/uL (ref 1.4–7.7)
Neutrophils Relative %: 75.4 % (ref 43.0–77.0)
Platelets: 142 10*3/uL — ABNORMAL LOW (ref 150.0–400.0)
RBC: 5 Mil/uL (ref 4.22–5.81)
RDW: 13.3 % (ref 11.5–15.5)
WBC: 6.5 10*3/uL (ref 4.0–10.5)

## 2021-03-13 LAB — COMPREHENSIVE METABOLIC PANEL
ALT: 6 U/L (ref 0–53)
AST: 15 U/L (ref 0–37)
Albumin: 4.4 g/dL (ref 3.5–5.2)
Alkaline Phosphatase: 95 U/L (ref 39–117)
BUN: 11 mg/dL (ref 6–23)
CO2: 29 mEq/L (ref 19–32)
Calcium: 9.7 mg/dL (ref 8.4–10.5)
Chloride: 104 mEq/L (ref 96–112)
Creatinine, Ser: 1.22 mg/dL (ref 0.40–1.50)
GFR: 58.08 mL/min — ABNORMAL LOW (ref >60.00)
Glucose, Bld: 139 mg/dL — ABNORMAL HIGH (ref 70–99)
Potassium: 4.8 mEq/L (ref 3.5–5.1)
Sodium: 142 mEq/L (ref 135–145)
Total Bilirubin: 0.8 mg/dL (ref 0.2–1.2)
Total Protein: 7.2 g/dL (ref 6.0–8.3)

## 2021-03-13 LAB — SEDIMENTATION RATE: Sed Rate: 14 mm/hr (ref 0–20)

## 2021-03-13 LAB — TSH: TSH: 0.68 u[IU]/mL (ref 0.35–4.50)

## 2021-03-13 LAB — T4, FREE: Free T4: 0.88 ng/dL (ref 0.60–1.60)

## 2021-03-13 NOTE — Progress Notes (Signed)
Established Patient Office Visit  Subjective:  Patient ID: Kevin [name], male    DOB: [birth year]  Age: 75 y.o. MRN: [id number]  CC:  Chief Complaint  Patient presents with  . Weight Loss    Noticed rapid weight loss x 3 months, seems to have been worse over the last month per pt wife    HPI Kevin [name] presents for unintentional weight loss.  He has chronic problems including chronic systolic and diastolic heart failure, hypertension, type 2 diabetes, [facility]  He was recently started on low-dose Jardiance per endocrinology but not clear this is related to his weight loss.  His weight was 173 pounds back in December of last year and 170 pounds and February with current weight today 161 pounds.  He states his appetite is good but he states his taste seems to be down somewhat.  No total loss of taste.  No recent facial weakness.  No history of Bell's palsy.  No smell changes.  No consistent headaches.  Waist size has gone down from 36-35.  Denies any cough, abdominal pain, stool changes.  Past Medical History:  Diagnosis Date  . Agent orange exposure    in [place]  . Anemia   . Heart block AV second degree    a. s/p PPM implant with subsequent CRTD upgrade  . High cholesterol   . History of colon polyps   . Hypertension   . LBBB (left bundle branch block)   . Nonischemic cardiomyopathy ([place])    a. MDT CRTD upgrade 2016  . Pacemaker 08/27/2013   Dual-chamber Medtronic Adapta implanted January 2013 for bradycardia with alternating bundle branch block and 2:1 atrioventricular block   . Prostate cancer ([place])    "not been treated yet; on a wait and see" (01/22/2015)  . Type II diabetes mellitus ([place])     Past Surgical History:  Procedure Laterality Date  . BASAL CELL CARCINOMA EXCISION     "back, face, neck"  . BI-VENTRICULAR IMPLANTABLE CARDIOVERTER DEFIBRILLATOR UPGRADE N/A 01/22/2015   MDT CRTD upgrade by Dr Rayann Heman  . BIV ICD GENERATOR  CHANGEOUT N/A 08/28/2020   Procedure: BIV ICD GENERATOR CHANGEOUT;  Surgeon: [name], MD;  Location: [place];  Service: Cardiovascular;  Laterality: N/A;  . CARDIAC CATHETERIZATION  01/27/2012   normal coronaries, dilated LV c/w nonischemic cardiomyopathy  . EXAMINATION UNDER ANESTHESIA  04/06/2012   Procedure: EXAM UNDER ANESTHESIA;  Surgeon: Marcello Moores A. Cornett, MD;  Location: [place] [facility];  Service: General;  Laterality: N/A;  . INCISION AND DRAINAGE PERIRECTAL ABSCESS  ~ 2010; 2015  . INGUINAL HERNIA REPAIR Left 1980  . LEFT HEART CATHETERIZATION WITH CORONARY ANGIOGRAM N/A 01/27/2012   Procedure: LEFT HEART CATHETERIZATION WITH CORONARY ANGIOGRAM;  Surgeon: Troy Sine, MD;  Location: [facility] C[facility];  Service: Cardiovascular;  Laterality: N/A;  . NM MYOCAR PERF WALL MOTION  01/21/2012   mod-severe perfusion defect due to infarct/scar w/mild perinfarct ischemia in the apical,basal inferoseptal,basal inferior,mid inferoseptal,midinferior and apical inferior regions  . pacemaker battery    . PERMANENT PACEMAKER INSERTION N/A 12/01/2011   Medtronic implanted by Dr Sallyanne Kuster  . PROSTATE BIOPSY    . PROSTATE BIOPSY    . shrapnel surgery     "in [place]"  . TONSILLECTOMY      Family History  Problem Relation Age of Onset  . Ovarian cancer Mother   . Cancer Mother        ?uterine  .  Leukemia Father   . Heart disease Father   . Diabetes Neg Hx     Social History   Socioeconomic History  . Marital status: Married    Spouse name: Not on file  . Number of children: 0  . Years of education: Not on file  . Highest education level: Not on file  Occupational History  . Occupation: Surveyor, minerals: [employer]  . Occupation: Retired  Tobacco Use  . Smoking status: Former Smoker    Packs/day: 1.00    Years: 20.00    Pack years: 20.00    Types: Cigarettes    Quit date: 04/13/1984    Years since quitting: 36.9  . Smokeless tobacco: Never Used  Vaping Use  .  Vaping Use: Never used  Substance and Sexual Activity  . Alcohol use: Yes    Comment: 01/22/2015 "maybe 3 beers or glasses of wine /month"  . Drug use: No  . Sexual activity: Yes    Partners: Female  Other Topics Concern  . Not on file  Social History Narrative   Pt lives in [place] with spouse in 2 story home. He has three step children. 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 [place] Veteran. 14 years of education. Volunteer at [employer] 3 days a week. Right handed     Social Determinants of Health   Financial Resource Strain: Low Risk   . Difficulty of Paying Living Expenses: Not hard at all  Food Insecurity: No Food Insecurity  . Worried About Charity fundraiser in the Last Year: Never true  . Ran Out of Food in the Last Year: Never true  Transportation Needs: No Transportation Needs  . Lack of Transportation (Medical): No  . Lack of Transportation (Non-Medical): No  Physical Activity: Sufficiently Active  . Days of Exercise per Week: 2 days  . Minutes of Exercise per Session: 150+ min  Stress: No Stress Concern Present  . Feeling of Stress : Not at all  Social Connections: Socially Integrated  . Frequency of Communication with Friends and Family: More than three times a week  . Frequency of Social Gatherings with Friends and Family: Three times a week  . Attends Religious Services: More than 4 times per year  . Active Member of Clubs or Organizations: Yes  . Attends Archivist Meetings: More than 4 times per year  . Marital Status: Married  Human resources officer Violence: Not At Risk  . Fear of Current or Ex-Partner: No  . Emotionally Abused: No  . Physically Abused: No  . Sexually Abused: No    Outpatient Medications Prior to Visit  Medication Sig Dispense Refill  . ACCU-CHEK COMPACT PLUS test strip use 1 TEST STRIP once daily as directed 102 each 5  . atorvastatin (LIPITOR) 80 MG tablet Take 40 mg by mouth daily at 6 PM.    .  buPROPion (WELLBUTRIN XL) 150 MG 24 hr tablet Take 2 tablets in AM, 1 tablet in PM 270 tablet 3  . carvedilol (COREG) 12.5 MG tablet Take 6.25 mg by mouth 2 (two) times daily with a meal.    . CHELATED MAGNESIUM PO Take 250 mg by mouth daily.    . clobetasol ointment (TEMOVATE) 2.54 % Apply 1 application topically daily as needed (itching).     . clopidogrel (PLAVIX) 75 MG tablet TAKE 1 TABLET(75 MG) BY MOUTH DAILY 90 tablet 3  . Continuous Blood Gluc Receiver (FREESTYLE LIBRE 14  DAY READER) DEVI 1 each by Does not apply route See admin instructions. Use Freestyle Libre Reader to monitor blood sugars. 1 each 0  . Continuous Blood Gluc Sensor (FREESTYLE LIBRE 14 DAY SENSOR) MISC Change every 14 days as directed or needed 6 each 2  . donepezil (ARICEPT) 5 MG tablet Take 1 tablet (5 mg total) by mouth daily. 90 tablet 3  . Empagliflozin-metFORMIN HCl ER (SYNJARDY XR) 03-999 MG TB24 Take 2 tablets by mouth daily. 60 tablet 2  . LORazepam (ATIVAN) 0.5 MG tablet Take 0.5 mg by mouth every 8 (eight) hours as needed for anxiety.     . memantine (NAMENDA) 10 MG tablet TAKE 1 TABLET BY MOUTH EVERY NIGHT FOR 2 WEEKS. INCREASE TO 1 TABLET 2 TIMES A DAY 180 tablet 1  . VIAGRA 100 MG tablet TAKE ONE TABLET BY MOUTH AS NEEDED FOR ERECTILE DYSFUNCTION. (Patient taking differently: Take 50 mg by mouth as needed for erectile dysfunction.) 10 tablet 3   No facility-administered medications prior to visit.    Allergies  Allergen Reactions  . Sulfa Antibiotics Swelling    Swelling of lips only.  Marland Kitchen 5-Alpha Reductase Inhibitors     ROS Review of Systems  Constitutional: Positive for unexpected weight change. Negative for appetite change, chills and fever.  Respiratory: Negative for cough and shortness of breath.   Cardiovascular: Negative for chest pain.  Gastrointestinal: Negative for abdominal pain, diarrhea and nausea.  Genitourinary: Negative for dysuria.  Musculoskeletal: Negative for back pain.  Skin:  Negative for rash.  Neurological: Positive for light-headedness. Negative for syncope and weakness.  Hematological: Negative for adenopathy.      Objective:    Physical Exam Vitals reviewed.  HENT:     Mouth/Throat:     Mouth: Mucous membranes are moist.     Pharynx: Oropharynx is clear. No oropharyngeal exudate.  Cardiovascular:     Rate and Rhythm: Normal rate and regular rhythm.  Pulmonary:     Effort: Pulmonary effort is normal.     Breath sounds: Normal breath sounds.  Abdominal:     Palpations: Abdomen is soft. There is no mass.     Tenderness: There is no abdominal tenderness.  Musculoskeletal:     Cervical back: Neck supple.  Lymphadenopathy:     Cervical: No cervical adenopathy.  Skin:    Comments: Inflamed sebaceous cyst left upper back.  Mild overlying erythema and mild tenderness.  Neurological:     Mental Status: He is alert.     BP 124/70 (BP Location: Left Arm, Patient Position: Sitting, Cuff Size: Normal)   Pulse 89   Temp 97.8 F (36.6 C) (Oral)   Wt 161 lb 6.4 oz (73.2 kg)   SpO2 95%   BMI 20.17 kg/m  Wt Readings from Last 3 Encounters:  03/13/21 161 lb 6.4 oz (73.2 kg)  12/26/20 170 lb 6.4 oz (77.3 kg)  12/03/20 170 lb 6.4 oz (77.3 kg)     Health Maintenance Due  Topic Date Due  . OPHTHALMOLOGY EXAM  Never done  . Hepatitis C Screening  Never done  . COLONOSCOPY (Pts 45-43yrs Insurance coverage will need to be confirmed)  08/20/2016  . COVID-19 Vaccine (3 - Pfizer risk 4-dose series) 01/14/2020    There are no preventive care reminders to display for this patient.  Lab Results  Component Value Date   TSH 0.49 06/06/2019   Lab Results  Component Value Date   WBC 7.7 08/15/2020   HGB 16.3 08/15/2020  HCT 47.5 08/15/2020   MCV 98 (H) 08/15/2020   PLT 192 08/15/2020   Lab Results  Component Value Date   NA 138 11/12/2020   K 4.4 11/12/2020   CO2 28 11/12/2020   GLUCOSE 217 (H) 11/12/2020   BUN 13 11/12/2020   CREATININE 1.35  11/12/2020   BILITOT 0.6 09/24/2020   ALKPHOS 75 09/24/2020   AST 25 09/24/2020   ALT 15 09/24/2020   PROT 7.3 09/24/2020   ALBUMIN 4.0 09/24/2020   CALCIUM 9.1 11/12/2020   ANIONGAP 10 01/23/2015   GFR 51.55 (L) 11/12/2020   Lab Results  Component Value Date   CHOL 103 09/24/2020   Lab Results  Component Value Date   HDL 39.10 09/24/2020   Lab Results  Component Value Date   LDLCALC 39 09/24/2020   Lab Results  Component Value Date   TRIG 126.0 09/24/2020   Lab Results  Component Value Date   CHOLHDL 3 09/24/2020   Lab Results  Component Value Date   HGBA1C 6.1 (A) 09/24/2020      Assessment & Plan:   Problem List Items Addressed This Visit   None   Visit Diagnoses    Unintended weight loss    -  Primary   Relevant Orders   TSH   CBC with Differential/Platelet   CMP   Sedimentation rate   T4, Free    He relates some mild decreased taste but not total loss of taste.  No loss of smell.  Etiology unclear.  Does have [facility] but it sounds like this has been fairly well contained.  Does have documented weight loss of about 12 pounds since last December.  When on recent low-dose Jardiance but doubt this would account for that much weight loss  -Check further labs with CBC, TSH, CMP, sed rate -Consider Glucerna supplement -We will follow-up early next week to review lab results and discuss possible further evaluation then. He has inflamed sebaceous cyst left upper back and may need to be drained.  We will follow-up Monday or Tuesday for that  No orders of the defined types were placed in this encounter.   Follow-up: Return in about 3 days (around 03/16/2021).    Carolann Littler, MD

## 2021-03-13 NOTE — Patient Instructions (Signed)
Set up follow up for Monday or Tuesday for 30 minutes  Consider Glucerna nutrition supplement.

## 2021-03-17 ENCOUNTER — Ambulatory Visit: Payer: Medicare HMO | Admitting: Family Medicine

## 2021-03-18 ENCOUNTER — Encounter: Payer: Self-pay | Admitting: Family Medicine

## 2021-03-23 ENCOUNTER — Other Ambulatory Visit: Payer: Self-pay

## 2021-03-23 ENCOUNTER — Ambulatory Visit (INDEPENDENT_AMBULATORY_CARE_PROVIDER_SITE_OTHER): Payer: Medicare HMO | Admitting: Family Medicine

## 2021-03-23 ENCOUNTER — Encounter: Payer: Self-pay | Admitting: Family Medicine

## 2021-03-23 VITALS — BP 132/68 | HR 90 | Temp 97.9°F | Wt 160.5 lb

## 2021-03-23 DIAGNOSIS — L02212 Cutaneous abscess of back [any part, except buttock]: Secondary | ICD-10-CM

## 2021-03-23 DIAGNOSIS — Z9181 History of falling: Secondary | ICD-10-CM | POA: Diagnosis not present

## 2021-03-23 NOTE — Progress Notes (Signed)
Remote ICD transmission.   

## 2021-03-23 NOTE — Patient Instructions (Signed)
Keep bandage dry until follow up.   We need to follow up in 2 days.

## 2021-03-23 NOTE — Progress Notes (Signed)
Established Patient Office Visit  Subjective:  Patient ID: Kevin [name], male    DOB: Jan 28, 1946  Age: 74 y.o. MRN: [id number]  CC:  Chief Complaint  Patient presents with  . Follow-up    Follow up on medications. Pt states he has a cyst on his back he would like looked at    HPI Kevin [name] presents for drainage from abscess upper back.  He has known history of recurrent sebaceous cyst.  Current cyst has been inflamed for some time and did start draining some spontaneous over the weekend.  Was very sore and somewhat less so now.  No fevers or chills.  He has had some recent weight loss.  We obtained multiple screening labs last visit these came back unremarkable.  He feels frequently "off balance ".  Generally more weak than usual.  Has had a couple of recent falls.  Would like to consider physical therapy.  Past Medical History:  Diagnosis Date  . Agent orange exposure    in [place]  . Anemia   . Heart block AV second degree    a. s/p PPM implant with subsequent CRTD upgrade  . High cholesterol   . History of colon polyps   . Hypertension   . LBBB (left bundle branch block)   . Nonischemic cardiomyopathy ([place])    a. MDT CRTD upgrade 2016  . Pacemaker 08/27/2013   Dual-chamber Medtronic Adapta implanted January 2013 for bradycardia with alternating bundle branch block and 2:1 atrioventricular block   . Prostate cancer ([place])    "not been treated yet; on a wait and see" (01/22/2015)  . Type II diabetes mellitus ([place])     Past Surgical History:  Procedure Laterality Date  . BASAL CELL CARCINOMA EXCISION     "back, face, neck"  . BI-VENTRICULAR IMPLANTABLE CARDIOVERTER DEFIBRILLATOR UPGRADE N/A 01/22/2015   MDT CRTD upgrade by Dr Rayann Heman  . BIV ICD GENERATOR CHANGEOUT N/A 08/28/2020   Procedure: BIV ICD GENERATOR CHANGEOUT;  Surgeon: [name], MD;  Location: [place] [facility];  Service: Cardiovascular;  Laterality: N/A;  . CARDIAC CATHETERIZATION   01/27/2012   normal coronaries, dilated LV c/w nonischemic cardiomyopathy  . EXAMINATION UNDER ANESTHESIA  04/06/2012   Procedure: EXAM UNDER ANESTHESIA;  Surgeon: Marcello Moores A. Cornett, MD;  Location: [place];  Service: General;  Laterality: N/A;  . INCISION AND DRAINAGE PERIRECTAL ABSCESS  ~ 2010; 2015  . INGUINAL HERNIA REPAIR Left 1980  . LEFT HEART CATHETERIZATION WITH CORONARY ANGIOGRAM N/A 01/27/2012   Procedure: LEFT HEART CATHETERIZATION WITH CORONARY ANGIOGRAM;  Surgeon: Troy Sine, MD;  Location: [facility] CATH LAB;  Service: Cardiovascular;  Laterality: N/A;  . NM MYOCAR PERF WALL MOTION  01/21/2012   mod-severe perfusion defect due to infarct/scar w/mild perinfarct ischemia in the apical,basal inferoseptal,basal inferior,mid inferoseptal,midinferior and apical inferior regions  . pacemaker battery    . PERMANENT PACEMAKER INSERTION N/A 12/01/2011   Medtronic implanted by Dr Sallyanne Kuster  . PROSTATE BIOPSY    . PROSTATE BIOPSY    . shrapnel surgery     "in [place]"  . TONSILLECTOMY      Family History  Problem Relation Age of Onset  . Ovarian cancer Mother   . Cancer Mother        ?uterine  . Leukemia Father   . Heart disease Father   . Diabetes Neg Hx     Social History   Socioeconomic History  . Marital status: Married    Spouse  name: Not on file  . Number of children: 0  . Years of education: Not on file  . Highest education level: Not on file  Occupational History  . Occupation: Surveyor, minerals: [employer]  . Occupation: Retired  Tobacco Use  . Smoking status: Former Smoker    Packs/day: 1.00    Years: 20.00    Pack years: 20.00    Types: Cigarettes    Quit date: 04/13/1984    Years since quitting: 36.9  . Smokeless tobacco: Never Used  Vaping Use  . Vaping Use: Never used  Substance and Sexual Activity  . Alcohol use: Yes    Comment: 01/22/2015 "maybe 3 beers or glasses of wine /month"  . Drug use: No  . Sexual activity: Yes    Partners: Female   Other Topics Concern  . Not on file  Social History Narrative   Pt lives in [place] with spouse in 2 story home. He has three step children. 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 [place] Veteran. 14 years of education. Volunteer at [employer] 3 days a week. Right handed     Social Determinants of Health   Financial Resource Strain: Low Risk   . Difficulty of Paying Living Expenses: Not hard at all  Food Insecurity: No Food Insecurity  . Worried About Charity fundraiser in the Last Year: Never true  . Ran Out of Food in the Last Year: Never true  Transportation Needs: No Transportation Needs  . Lack of Transportation (Medical): No  . Lack of Transportation (Non-Medical): No  Physical Activity: Sufficiently Active  . Days of Exercise per Week: 2 days  . Minutes of Exercise per Session: 150+ min  Stress: No Stress Concern Present  . Feeling of Stress : Not at all  Social Connections: Socially Integrated  . Frequency of Communication with Friends and Family: More than three times a week  . Frequency of Social Gatherings with Friends and Family: Three times a week  . Attends Religious Services: More than 4 times per year  . Active Member of Clubs or Organizations: Yes  . Attends Archivist Meetings: More than 4 times per year  . Marital Status: Married  Human resources officer Violence: Not At Risk  . Fear of Current or Ex-Partner: No  . Emotionally Abused: No  . Physically Abused: No  . Sexually Abused: No    Outpatient Medications Prior to Visit  Medication Sig Dispense Refill  . ACCU-CHEK COMPACT PLUS test strip use 1 TEST STRIP once daily as directed 102 each 5  . atorvastatin (LIPITOR) 80 MG tablet Take 40 mg by mouth daily at 6 PM.    . buPROPion (WELLBUTRIN XL) 150 MG 24 hr tablet Take 2 tablets in AM, 1 tablet in PM 270 tablet 3  . carvedilol (COREG) 12.5 MG tablet Take 6.25 mg by mouth 2 (two) times daily with a meal.    . CHELATED  MAGNESIUM PO Take 250 mg by mouth daily.    . clobetasol ointment (TEMOVATE) 9.52 % Apply 1 application topically daily as needed (itching).     . clopidogrel (PLAVIX) 75 MG tablet TAKE 1 TABLET(75 MG) BY MOUTH DAILY 90 tablet 3  . Continuous Blood Gluc Receiver (FREESTYLE LIBRE 14 DAY READER) DEVI 1 each by Does not apply route See admin instructions. Use Freestyle Libre Reader to monitor blood sugars. 1 each 0  . Continuous Blood Gluc Sensor (FREESTYLE LIBRE 14  DAY SENSOR) MISC Change every 14 days as directed or needed 6 each 2  . donepezil (ARICEPT) 5 MG tablet Take 1 tablet (5 mg total) by mouth daily. 90 tablet 3  . Empagliflozin-metFORMIN HCl ER (SYNJARDY XR) 03-999 MG TB24 Take 2 tablets by mouth daily. 60 tablet 2  . LORazepam (ATIVAN) 0.5 MG tablet Take 0.5 mg by mouth every 8 (eight) hours as needed for anxiety.     . memantine (NAMENDA) 10 MG tablet TAKE 1 TABLET BY MOUTH EVERY NIGHT FOR 2 WEEKS. INCREASE TO 1 TABLET 2 TIMES A DAY 180 tablet 1  . VIAGRA 100 MG tablet TAKE ONE TABLET BY MOUTH AS NEEDED FOR ERECTILE DYSFUNCTION. (Patient taking differently: Take 50 mg by mouth as needed for erectile dysfunction.) 10 tablet 3   No facility-administered medications prior to visit.    Allergies  Allergen Reactions  . Sulfa Antibiotics Swelling    Swelling of lips only.  Marland Kitchen 5-Alpha Reductase Inhibitors     ROS Review of Systems  Constitutional: Negative for chills and fever.  Neurological: Positive for weakness. Negative for syncope and headaches.      Objective:    Physical Exam Vitals reviewed.  Constitutional:      Appearance: Normal appearance.  Cardiovascular:     Rate and Rhythm: Normal rate.  Pulmonary:     Effort: Pulmonary effort is normal.     Breath sounds: Normal breath sounds.  Skin:    Comments: Large sebaceous cyst near the midline upper back thoracic area.  Mild overlying erythema.  He has some fluctuance near the center.  Minimal drainage.  Mildly tender.   Neurological:     Mental Status: He is alert.     BP 132/68 (BP Location: Left Arm, Patient Position: Sitting, Cuff Size: Normal)   Pulse 90   Temp 97.9 F (36.6 C) (Oral)   Wt 160 lb 8 oz (72.8 kg)   SpO2 95%   BMI 20.06 kg/m  Wt Readings from Last 3 Encounters:  03/23/21 160 lb 8 oz (72.8 kg)  03/13/21 161 lb 6.4 oz (73.2 kg)  12/26/20 170 lb 6.4 oz (77.3 kg)     Health Maintenance Due  Topic Date Due  . OPHTHALMOLOGY EXAM  Never done  . Hepatitis C Screening  Never done  . COLONOSCOPY (Pts 45-57yrs Insurance coverage will need to be confirmed)  08/20/2016  . COVID-19 Vaccine (3 - Pfizer risk 4-dose series) 01/14/2020    There are no preventive care reminders to display for this patient.  Lab Results  Component Value Date   TSH 0.68 03/13/2021   Lab Results  Component Value Date   WBC 6.5 03/13/2021   HGB 16.5 03/13/2021   HCT 49.0 03/13/2021   MCV 98.2 03/13/2021   PLT 142.0 (L) 03/13/2021   Lab Results  Component Value Date   NA 142 03/13/2021   K 4.8 03/13/2021   CO2 29 03/13/2021   GLUCOSE 139 (H) 03/13/2021   BUN 11 03/13/2021   CREATININE 1.22 03/13/2021   BILITOT 0.8 03/13/2021   ALKPHOS 95 03/13/2021   AST 15 03/13/2021   ALT 6 03/13/2021   PROT 7.2 03/13/2021   ALBUMIN 4.4 03/13/2021   CALCIUM 9.7 03/13/2021   ANIONGAP 10 01/23/2015   GFR 58.08 (L) 03/13/2021   Lab Results  Component Value Date   CHOL 103 09/24/2020   Lab Results  Component Value Date   HDL 39.10 09/24/2020   Lab Results  Component Value Date  [place] 39 09/24/2020   Lab Results  Component Value Date   TRIG 126.0 09/24/2020   Lab Results  Component Value Date   CHOLHDL 3 09/24/2020   Lab Results  Component Value Date   HGBA1C 6.1 (A) 09/24/2020      Assessment & Plan:   #1 abscess sebaceous cyst of the back.  This is draining slightly spontaneously-but does have large area of fluctuance and some overlying erythema  -Discussed recommendation for  incision and drainage including risk of bleeding and patient consents.  Area over fluctuance and anesthetized with 1% plain Xylocaine.  Using #11 blade made approximately 1-1/2 cm horizontal incision over area of fluctuance.  Expressed some copious drainage of pus and some contents of sebaceous cyst sac.  Used hemostats and freed up some loculations.  Minimal bleeding.  Outer dressing applied -Keep covered for couple days then clean daily with soap and water.  He is aware that he still has sebaceous cyst and this has not been removed and will likely recur with time  #2 generalized weakness.  High risk for falls. -Recommend setting up some outpatient physical therapy for general strengthening and also for balance exercises and patient agrees  No orders of the defined types were placed in this encounter.   Follow-up: Return in about 2 days (around 03/25/2021).    Carolann Littler, MD

## 2021-03-24 ENCOUNTER — Ambulatory Visit: Payer: Medicare HMO | Admitting: Physical Therapy

## 2021-03-25 ENCOUNTER — Other Ambulatory Visit: Payer: Self-pay

## 2021-03-25 ENCOUNTER — Encounter: Payer: Self-pay | Admitting: Family Medicine

## 2021-03-25 ENCOUNTER — Ambulatory Visit (INDEPENDENT_AMBULATORY_CARE_PROVIDER_SITE_OTHER): Payer: Medicare HMO | Admitting: Family Medicine

## 2021-03-25 VITALS — BP 120/70 | HR 100 | Ht 75.0 in | Wt 160.5 lb

## 2021-03-25 DIAGNOSIS — L02212 Cutaneous abscess of back [any part, except buttock]: Secondary | ICD-10-CM

## 2021-03-25 DIAGNOSIS — R634 Abnormal weight loss: Secondary | ICD-10-CM

## 2021-03-25 NOTE — Progress Notes (Addendum)
Established Patient Office Visit  Subjective:  Patient ID: Kevin [name], male    DOB: 1946/04/04  Age: 75 y.o. MRN: [id number]  CC:  Chief Complaint  Patient presents with  . Follow-up    HPI Kevin [name] presents for follow-up regarding abscessed sebaceous cyst upper back.  Incision and drainage couple days ago.  He has had some continued drainage and has changed outer dressing a couple of times.  No fevers or chills.  He states this is now pruritic and nonpainful.  He has been taking some hot showers for irrigation and for the heat.  He states this makes it feels better.  Lately feels off balance and some generalized weakness,   Labs unrevealing.  PT orders have been placed.    Recent modest weight loss.   We reviewed labs from 03-13-21 which were mostly unremarkable.  He is using Glucerna supplement couple of times per day.    Past Medical History:  Diagnosis Date  . Agent orange exposure    in [place]  . Anemia   . Heart block AV second degree    a. s/p PPM implant with subsequent CRTD upgrade  . High cholesterol   . History of colon polyps   . Hypertension   . LBBB (left bundle branch block)   . Nonischemic cardiomyopathy ([place])    a. MDT CRTD upgrade 2016  . Pacemaker 08/27/2013   Dual-chamber Medtronic Adapta implanted January 2013 for bradycardia with alternating bundle branch block and 2:1 atrioventricular block   . Prostate cancer ([place])    "not been treated yet; on a wait and see" (01/22/2015)  . Type II diabetes mellitus ([place])     Past Surgical History:  Procedure Laterality Date  . BASAL CELL CARCINOMA EXCISION     "back, face, neck"  . BI-VENTRICULAR IMPLANTABLE CARDIOVERTER DEFIBRILLATOR UPGRADE N/A 01/22/2015   MDT CRTD upgrade by Dr Rayann Heman  . BIV ICD GENERATOR CHANGEOUT N/A 08/28/2020   Procedure: BIV ICD GENERATOR CHANGEOUT;  Surgeon: [name], MD;  Location: [facility];  Service: Cardiovascular;  Laterality: N/A;  . CARDIAC  CATHETERIZATION  01/27/2012   normal coronaries, dilated LV c/w nonischemic cardiomyopathy  . EXAMINATION UNDER ANESTHESIA  04/06/2012   Procedure: EXAM UNDER ANESTHESIA;  Surgeon: Marcello Moores A. Cornett, MD;  Location: [facility];  Service: General;  Laterality: N/A;  . INCISION AND DRAINAGE PERIRECTAL ABSCESS  ~ 2010; 2015  . INGUINAL HERNIA REPAIR Left 1980  . LEFT HEART CATHETERIZATION WITH CORONARY ANGIOGRAM N/A 01/27/2012   Procedure: LEFT HEART CATHETERIZATION WITH CORONARY ANGIOGRAM;  Surgeon: Troy Sine, MD;  Location: [facility];  Service: Cardiovascular;  Laterality: N/A;  . NM MYOCAR PERF WALL MOTION  01/21/2012   mod-severe perfusion defect due to infarct/scar w/mild perinfarct ischemia in the apical,basal inferoseptal,basal inferior,mid inferoseptal,midinferior and apical inferior regions  . pacemaker battery    . PERMANENT PACEMAKER INSERTION N/A 12/01/2011   Medtronic implanted by Dr Sallyanne Kuster  . PROSTATE BIOPSY    . PROSTATE BIOPSY    . shrapnel surgery     "in [place]"  . TONSILLECTOMY      Family History  Problem Relation Age of Onset  . Ovarian cancer Mother   . Cancer Mother        ?uterine  . Leukemia Father   . Heart disease Father   . Diabetes Neg Hx     Social History   Socioeconomic History  . Marital status: Married  Spouse name: Not on file  . Number of children: 0  . Years of education: Not on file  . Highest education level: Not on file  Occupational History  . Occupation: Surveyor, minerals: [employer]  . Occupation: Retired  Tobacco Use  . Smoking status: Former Smoker    Packs/day: 1.00    Years: 20.00    Pack years: 20.00    Types: Cigarettes    Quit date: 04/13/1984    Years since quitting: 36.9  . Smokeless tobacco: Never Used  Vaping Use  . Vaping Use: Never used  Substance and Sexual Activity  . Alcohol use: Yes    Comment: 01/22/2015 "maybe 3 beers or glasses of wine /month"  . Drug use: No  . Sexual activity: Yes     Partners: Female  Other Topics Concern  . Not on file  Social History Narrative   Pt lives in [place] with spouse in 2 story home. He has three step children. 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 [place] Veteran. 14 years of education. Volunteer at [employer] 3 days a week. Right handed     Social Determinants of Health   Financial Resource Strain: Low Risk   . Difficulty of Paying Living Expenses: Not hard at all  Food Insecurity: No Food Insecurity  . Worried About Charity fundraiser in the Last Year: Never true  . Ran Out of Food in the Last Year: Never true  Transportation Needs: No Transportation Needs  . Lack of Transportation (Medical): No  . Lack of Transportation (Non-Medical): No  Physical Activity: Sufficiently Active  . Days of Exercise per Week: 2 days  . Minutes of Exercise per Session: 150+ min  Stress: No Stress Concern Present  . Feeling of Stress : Not at all  Social Connections: Socially Integrated  . Frequency of Communication with Friends and Family: More than three times a week  . Frequency of Social Gatherings with Friends and Family: Three times a week  . Attends Religious Services: More than 4 times per year  . Active Member of Clubs or Organizations: Yes  . Attends Archivist Meetings: More than 4 times per year  . Marital Status: Married  Human resources officer Violence: Not At Risk  . Fear of Current or Ex-Partner: No  . Emotionally Abused: No  . Physically Abused: No  . Sexually Abused: No    Outpatient Medications Prior to Visit  Medication Sig Dispense Refill  . ACCU-CHEK COMPACT PLUS test strip use 1 TEST STRIP once daily as directed 102 each 5  . atorvastatin (LIPITOR) 80 MG tablet Take 40 mg by mouth daily at 6 PM.    . buPROPion (WELLBUTRIN XL) 150 MG 24 hr tablet Take 2 tablets in AM, 1 tablet in PM 270 tablet 3  . carvedilol (COREG) 12.5 MG tablet Take 6.25 mg by mouth 2 (two) times daily with a meal.    .  CHELATED MAGNESIUM PO Take 250 mg by mouth daily.    . clobetasol ointment (TEMOVATE) 7.06 % Apply 1 application topically daily as needed (itching).     . clopidogrel (PLAVIX) 75 MG tablet TAKE 1 TABLET(75 MG) BY MOUTH DAILY 90 tablet 3  . Continuous Blood Gluc Receiver (FREESTYLE LIBRE 14 DAY READER) DEVI 1 each by Does not apply route See admin instructions. Use Freestyle Libre Reader to monitor blood sugars. 1 each 0  . Continuous Blood Gluc Sensor (FREESTYLE LIBRE  14 DAY SENSOR) MISC Change every 14 days as directed or needed 6 each 2  . donepezil (ARICEPT) 5 MG tablet Take 1 tablet (5 mg total) by mouth daily. 90 tablet 3  . Empagliflozin-metFORMIN HCl ER (SYNJARDY XR) 03-999 MG TB24 Take 2 tablets by mouth daily. 60 tablet 2  . LORazepam (ATIVAN) 0.5 MG tablet Take 0.5 mg by mouth every 8 (eight) hours as needed for anxiety.     . memantine (NAMENDA) 10 MG tablet TAKE 1 TABLET BY MOUTH EVERY NIGHT FOR 2 WEEKS. INCREASE TO 1 TABLET 2 TIMES A DAY 180 tablet 1  . VIAGRA 100 MG tablet TAKE ONE TABLET BY MOUTH AS NEEDED FOR ERECTILE DYSFUNCTION. (Patient taking differently: Take 50 mg by mouth as needed for erectile dysfunction.) 10 tablet 3   No facility-administered medications prior to visit.    Allergies  Allergen Reactions  . Sulfa Antibiotics Swelling    Swelling of lips only.  Marland Kitchen 5-Alpha Reductase Inhibitors     ROS Review of Systems  Constitutional: Negative for chills and fever.      Objective:    Physical Exam Vitals reviewed.  Constitutional:      Appearance: Normal appearance.  Cardiovascular:     Rate and Rhythm: Normal rate and regular rhythm.  Skin:    Comments: Abscess upper back.  This is near the midline.  Still has a little bit of purulent drainage from the wound.  No erythema.  Nontender.   Neurological:     Mental Status: He is alert.     BP 120/70   Pulse 100   Ht 6\' 3"  (1.905 m)   Wt 160 lb 8 oz (72.8 kg)   SpO2 97%   BMI 20.06 kg/m  Wt  Readings from Last 3 Encounters:  03/25/21 160 lb 8 oz (72.8 kg)  03/23/21 160 lb 8 oz (72.8 kg)  03/13/21 161 lb 6.4 oz (73.2 kg)     Health Maintenance Due  Topic Date Due  . OPHTHALMOLOGY EXAM  Never done  . Hepatitis C Screening  Never done  . COLONOSCOPY (Pts 45-36yrs Insurance coverage will need to be confirmed)  08/20/2016  . COVID-19 Vaccine (3 - Pfizer risk 4-dose series) 01/14/2020  . HEMOGLOBIN A1C  03/24/2021    There are no preventive care reminders to display for this patient.  Lab Results  Component Value Date   TSH 0.68 03/13/2021   Lab Results  Component Value Date   WBC 6.5 03/13/2021   HGB 16.5 03/13/2021   HCT 49.0 03/13/2021   MCV 98.2 03/13/2021   PLT 142.0 (L) 03/13/2021   Lab Results  Component Value Date   NA 142 03/13/2021   K 4.8 03/13/2021   CO2 29 03/13/2021   GLUCOSE 139 (H) 03/13/2021   BUN 11 03/13/2021   CREATININE 1.22 03/13/2021   BILITOT 0.8 03/13/2021   ALKPHOS 95 03/13/2021   AST 15 03/13/2021   ALT 6 03/13/2021   PROT 7.2 03/13/2021   ALBUMIN 4.4 03/13/2021   CALCIUM 9.7 03/13/2021   ANIONGAP 10 01/23/2015   GFR 58.08 (L) 03/13/2021   Lab Results  Component Value Date   CHOL 103 09/24/2020   Lab Results  Component Value Date   HDL 39.10 09/24/2020   Lab Results  Component Value Date   LDLCALC 39 09/24/2020   Lab Results  Component Value Date   TRIG 126.0 09/24/2020   Lab Results  Component Value Date   CHOLHDL 3 09/24/2020  Lab Results  Component Value Date   HGBA1C 6.1 (A) 09/24/2020      Assessment & Plan:   #1 Abscess sebaceous cyst upper back-improving following incision and drainage. -Patient aware that cyst is still present and this was not excised but simply drained.  If this continues to cause problems could be excised at a future date after infection subsides  #2 weight loss.   Etiology unclear.   He just started Glucerna supplement and this may take some time to see results Also discussed  doing some muscle building exercises for upper and lower extremities.   Recommend follow up within at least 3 months to reassess.   No orders of the defined types were placed in this encounter.   Follow-up: No follow-ups on file.    Carolann Littler, MD

## 2021-03-25 NOTE — Patient Instructions (Signed)
Clean back daily with soap and water  This should continue to drain some for several more days.

## 2021-04-09 ENCOUNTER — Other Ambulatory Visit: Payer: Self-pay | Admitting: Endocrinology

## 2021-04-13 ENCOUNTER — Telehealth: Payer: Self-pay | Admitting: Pharmacist

## 2021-04-13 ENCOUNTER — Ambulatory Visit (INDEPENDENT_AMBULATORY_CARE_PROVIDER_SITE_OTHER): Payer: Medicare HMO

## 2021-04-13 DIAGNOSIS — Z95810 Presence of automatic (implantable) cardiac defibrillator: Secondary | ICD-10-CM

## 2021-04-13 DIAGNOSIS — I5042 Chronic combined systolic (congestive) and diastolic (congestive) heart failure: Secondary | ICD-10-CM | POA: Diagnosis not present

## 2021-04-13 NOTE — Chronic Care Management (AMB) (Signed)
Chronic Care Management Pharmacy Assistant   Name: Kevin [name]  MRN: [id number] DOB: 08-Oct-1946  Reason for Encounter: Disease State   Conditions to be addressed/monitored: DMII  Recent office visits:  05.26.2022 Patient was seen a [facility] 05.18.2022 Kevin [name] (PCP) patient seen for follow-up abscess on upper back 05.16.2022 Kevin [name] (PCP) patient seen for follow-up on medication and abscess on upper back 05.12.2022 Patient was seen a department of veteran's affairs  05.06. 2022 Kevin [name] (PCP) patient seen for follow-up for weight loss.  Recent consult visits:  05.26.2022 Patient was seen a department of veteran's affairs 05.12.2022 Patient was seen a department of veteran's affairs    Hospital visits:  None in previous 6 months  Medications: Outpatient Encounter Medications as of 04/13/2021  Medication Sig   ACCU-CHEK COMPACT PLUS test strip use 1 TEST STRIP once daily as directed   atorvastatin (LIPITOR) 80 MG tablet Take 40 mg by mouth daily at 6 PM.   buPROPion (WELLBUTRIN XL) 150 MG 24 hr tablet Take 2 tablets in AM, 1 tablet in PM   carvedilol (COREG) 12.5 MG tablet Take 6.25 mg by mouth 2 (two) times daily with a meal.   CHELATED MAGNESIUM PO Take 250 mg by mouth daily.   clobetasol ointment (TEMOVATE) 6.37 % Apply 1 application topically daily as needed (itching).    clopidogrel (PLAVIX) 75 MG tablet TAKE 1 TABLET(75 MG) BY MOUTH DAILY   Continuous Blood Gluc Receiver (FREESTYLE LIBRE 14 DAY READER) DEVI 1 each by Does not apply route See admin instructions. Use Freestyle Libre Reader to monitor blood sugars.   Continuous Blood Gluc Sensor (FREESTYLE LIBRE 14 DAY SENSOR) MISC Change every 14 days as directed or needed   donepezil (ARICEPT) 5 MG tablet Take 1 tablet (5 mg total) by mouth daily.   LORazepam (ATIVAN) 0.5 MG tablet Take 0.5 mg by mouth every 8 (eight) hours as needed for anxiety.     memantine (NAMENDA) 10 MG tablet TAKE 1 TABLET BY MOUTH EVERY NIGHT FOR 2 WEEKS. INCREASE TO 1 TABLET 2 TIMES A DAY   SYNJARDY XR 03-999 MG TB24 TAKE 2 TABLETS BY MOUTH DAILY   VIAGRA 100 MG tablet TAKE ONE TABLET BY MOUTH AS NEEDED FOR ERECTILE DYSFUNCTION. (Patient taking differently: Take 50 mg by mouth as needed for erectile dysfunction.)   No facility-administered encounter medications on file as of 04/13/2021.   Recent Relevant Labs: Lab Results  Component Value Date/Time   HGBA1C 6.1 (A) 09/24/2020 02:20 PM   HGBA1C 6.5 11/30/2019 11:58 AM   HGBA1C 6.7 (H) 07/20/2019 08:48 AM   MICROALBUR 2.5 (H) 09/24/2020 02:27 PM   MICROALBUR 2.3 (H) 07/20/2019 08:48 AM    Kidney Function Lab Results  Component Value Date/Time   CREATININE 1.22 03/13/2021 10:05 AM   CREATININE 1.35 11/12/2020 01:42 PM   CREATININE 1.15 09/02/2016 08:19 AM   CREATININE 0.96 04/16/2016 12:08 PM   GFR 58.08 (L) 03/13/2021 10:05 AM   GFRNONAA 52 (L) 08/15/2020 10:06 AM   GFRAA 60 08/15/2020 10:06 AM   Current antihyperglycemic regimen:  Synjarny  RX 5-100 mg- take two tables by mouth daily What recent interventions/DTPs have been made to improve glycemic control:  Janumet XR 1000-1,00 mg was discontinued and Synjarny XR 03-999 mg two Tablets daily was started  Have there been any recent hospitalizations or ED visits since last visit with CPP? No Patient reports hypoglycemic symptoms, including Pale, Hungry,  and Nervous/irritable Patient denies hyperglycemic symptoms, including blurry vision, excessive thirst, fatigue, and polyuria How often are you checking your blood sugar? once daily What are your blood sugars ranging?  Fasting:  06.01 121 06.02 101 06.03 116 06.05 176 ( no medication) Before meals: N/A After meals: N/A Bedtime: N/A During the week, how often does your blood glucose drop below 70? Never Are you checking your feet daily/regularly?   Adherence Review: Is the patient currently on a  STATIN medication? No Is the patient currently on ACE/ARB medication? No Does the patient have >5 day gap between last estimated fill dates? No  I spoke with the patient and his wife Kevin [name] about medication adherence. He states that he has been doing fair. He says he continues to lose weight which he is unhappy with. He states he has lost a total of 25 pounds.  Since last seeing the pharmacist in November 2021 the patient has changed from Janumet XR [phone] mg, one tablet to Synjardy XR 5-1,000mg  two tablets daily. The patient states that he only takes 1 tablet daily. he states that he does have some episodes of dizziness. He is scheduled to see an endocrinologist on June 8th. There have been no other changes to his medications. He continues to take his medication as prescribed. There have been no hospital visits or urgent care visits since his last CPP or PCP visit. The patient did not want to schedule asked visit currently. He is not experiencing any issues with his pharmacy.  Star Rating Drugs: Medication Dispensed  Quantity Pharmacy  Empagliflozin-Metformin HCL 06.02.2022 [facility]   Amilia (Helper) Mare Ferrari, [place] Pharmacist Assistant [phone]

## 2021-04-14 NOTE — Progress Notes (Signed)
EPIC Encounter for ICM Monitoring  Patient Name: Kevin [name] is a 75 y.o. male Date: 04/14/2021 Primary Care Physican: [name], MD Primary Cardiologist:[name] Electrophysiologist: [name] Bi-V Pacing: 100% 5/26/2022OfficeWeight:162.3lbs   Transmission reviewed and results sent via mychart.  OptivolThoracic impedance normal.    Labs: 03/13/2021 Creatinine 1.22, BUN 11, Potassium 4.8, Sodium 142, GFR 58.08 A complete set of results can be found in Results Review.  Patient does not take a diuretic.  Recommendations:No changes.  Follow-up plan: ICM clinic phone appointment on7/09/2021. 91 day device clinic remote transmission7/26/2022.   EP/Cardiology Office Visits: Recall2/07/2022 with Oda Kilts, PA.  Copy of ICM check sent to Dr.[name].  3 month ICM trend: 04/13/2021.    1 Year ICM trend:       Rosalene Billings, RN 04/14/2021 2:26 PM

## 2021-04-15 ENCOUNTER — Other Ambulatory Visit: Payer: Self-pay

## 2021-04-15 ENCOUNTER — Encounter: Payer: Self-pay | Admitting: Endocrinology

## 2021-04-15 ENCOUNTER — Ambulatory Visit (INDEPENDENT_AMBULATORY_CARE_PROVIDER_SITE_OTHER): Payer: Medicare HMO | Admitting: Endocrinology

## 2021-04-15 VITALS — BP 124/72 | HR 90 | Ht 75.0 in | Wt 161.2 lb

## 2021-04-15 DIAGNOSIS — E7800 Pure hypercholesterolemia, unspecified: Secondary | ICD-10-CM | POA: Diagnosis not present

## 2021-04-15 DIAGNOSIS — R634 Abnormal weight loss: Secondary | ICD-10-CM | POA: Diagnosis not present

## 2021-04-15 DIAGNOSIS — E1165 Type 2 diabetes mellitus with hyperglycemia: Secondary | ICD-10-CM | POA: Diagnosis not present

## 2021-04-15 LAB — POCT GLYCOSYLATED HEMOGLOBIN (HGB A1C): Hemoglobin A1C: 6.5 % — AB (ref 4.0–5.6)

## 2021-04-15 NOTE — Progress Notes (Signed)
1         Patient ID: Kevin [name], male   DOB: [birth year], 75 y.o.   MRN: [id number]    Reason for Appointment:  Followup visit      History of Present Illness:          Diagnosis: Type 2 diabetes mellitus, date of diagnosis: 2001        Past history: His previous records are not available but initially his symptoms included weight loss He thinks he has been on metformin for several years and more recently has been taking 1000 mg twice a day [facility] His A1c was 6.9 in 2013 but a year later was 8.1 He had been on glipizide 5 mg twice a day in addition to his metformin 1 g twice a day   Prior to his consultation when his A1c was 10% and he was given Januvia in addition  Recent history:   Oral hypoglycemic drugs the patient is taking are: Synjardy XR 03/999, 2 tablets daily   His A1c is  6.5 and consistently under 7 Fructosamine is 272  Current management, blood sugar patterns and problems identified:   He has only used his freestyle libre sporadically and data available only for the last week or so intermittently  Mostly checking blood sugars only in the morning and only once after dinner  He takes Synjardy together in the morning and has no side effects from this  Although since January he has lost 12 pounds he seems to be having no recent weight loss  His appetite has been decreased but his wife thinks that some sugars are high from eating sweets  Also supplementing some Glucerna  He is eating more fruits but mostly apples, bananas and strawberries  Lab glucose was 139 last month       Side effects from medications have been: None  Glucose monitoring:  done 1 or less times a day         Glucometer:  Freestyle Libre     Blood Glucose readings from his CGM download   CGM use % of time  14  2-week average/GV  134  Time in range  93      %  % Time Above 180 7  % Time above 250   % Time Below 70       PRE-MEAL  6-8 AM Lunch Dinner  2-4 AM Overall  Glucose range:       Averages:  146    101  134   POST-MEAL PC Breakfast PC Lunch PC Dinner  Glucose range:   ?  Averages:        The previous average from CGM 126   Glycemic control:    Lab Results  Component Value Date   HGBA1C 6.5 (A) 04/15/2021   HGBA1C 6.1 (A) 09/24/2020   HGBA1C 6.5 11/30/2019   Lab Results  Component Value Date   MICROALBUR 2.5 (H) 09/24/2020   LDLCALC 39 09/24/2020   CREATININE 1.22 03/13/2021     Self-care: The diet that the patient has been following is: tries to limit portions, may get fried food when eating out, some fast food    Meals: 3 meals per day.  breakfast is at 7-8 am, sometimes special K cereal Usually eating baked chicken or chicken salad sandwiches for meals and will have fruits for snacks          Dietician visit: Most recent:2000  Weight history:  Wt Readings from Last 3 Encounters:  04/15/21 161 lb 4 oz (73.1 kg)  03/25/21 160 lb 8 oz (72.8 kg)  03/23/21 160 lb 8 oz (72.8 kg)    OTHER active problems: See Review of systems  Office Visit on 04/15/2021  Component Date Value Ref Range Status  . Hemoglobin A1C 04/15/2021 6.5* 4.0 - 5.6 % Final    Allergies as of 04/15/2021      Reactions   Sulfa Antibiotics Swelling   Swelling of lips only.   5-alpha Reductase Inhibitors       Medication List       Accurate as of April 15, 2021  4:30 PM. If you have any questions, ask your nurse or doctor.        STOP taking these medications   carvedilol 12.5 MG tablet Commonly known as: COREG Stopped by: Elayne Snare, MD     TAKE these medications   Accu-Chek Compact Plus test strip Generic drug: glucose blood use 1 TEST STRIP once daily as directed   atorvastatin 80 MG tablet Commonly known as: LIPITOR Take 40 mg by mouth daily at 6 PM.   buPROPion 150 MG 24 hr tablet Commonly known as: WELLBUTRIN XL Take 2 tablets in AM, 1 tablet in PM   CHELATED  MAGNESIUM PO Take 250 mg by mouth daily.   clobetasol ointment 0.05 % Commonly known as: TEMOVATE Apply 1 application topically daily as needed (itching).   clopidogrel 75 MG tablet Commonly known as: PLAVIX TAKE 1 TABLET(75 MG) BY MOUTH DAILY   donepezil 5 MG tablet Commonly known as: ARICEPT Take 1 tablet (5 mg total) by mouth daily.   FreeStyle Libre 14 Day Reader Kevin [name] 1 each by Does not apply route See admin instructions. Use Freestyle Libre Reader to monitor blood sugars.   FreeStyle Libre 14 Day Sensor Misc Change every 14 days as directed or needed   LORazepam 0.5 MG tablet Commonly known as: ATIVAN Take 0.5 mg by mouth every 8 (eight) hours as needed for anxiety.   memantine 10 MG tablet Commonly known as: NAMENDA TAKE 1 TABLET BY MOUTH EVERY NIGHT FOR 2 WEEKS. INCREASE TO 1 TABLET 2 TIMES A DAY   Synjardy XR 03-999 MG Tb24 Generic drug: Empagliflozin-metFORMIN HCl ER TAKE 2 TABLETS BY MOUTH DAILY   Viagra 100 MG tablet Generic drug: sildenafil TAKE ONE TABLET BY MOUTH AS NEEDED FOR ERECTILE DYSFUNCTION. What changed: See the new instructions.       Allergies:  Allergies  Allergen Reactions  . Sulfa Antibiotics Swelling    Swelling of lips only.  Marland Kitchen 5-Alpha Reductase Inhibitors     Past Medical History:  Diagnosis Date  . Agent orange exposure    in [place]  . Anemia   . Heart block AV second degree    a. s/p PPM implant with subsequent CRTD upgrade  . High cholesterol   . History of colon polyps   . Hypertension   . LBBB (left bundle branch block)   . Nonischemic cardiomyopathy ([place])    a. MDT CRTD upgrade 2016  . Pacemaker 08/27/2013   Dual-chamber Medtronic Adapta implanted January 2013 for bradycardia with alternating bundle branch block and 2:1 atrioventricular block   . Prostate cancer ([place])    "not been treated yet; on a wait and see" (01/22/2015)  . Type II diabetes mellitus ([place])     Past Surgical History:  Procedure Laterality  Date  . BASAL CELL CARCINOMA EXCISION     "  back, face, neck"  . BI-VENTRICULAR IMPLANTABLE CARDIOVERTER DEFIBRILLATOR UPGRADE N/A 01/22/2015   MDT CRTD upgrade by Dr Rayann Heman  . BIV ICD GENERATOR CHANGEOUT N/A 08/28/2020   Procedure: BIV ICD GENERATOR CHANGEOUT;  Surgeon: [name], MD;  Location: [facility];  Service: Cardiovascular;  Laterality: N/A;  . CARDIAC CATHETERIZATION  01/27/2012   normal coronaries, dilated LV c/w nonischemic cardiomyopathy  . EXAMINATION UNDER ANESTHESIA  04/06/2012   Procedure: EXAM UNDER ANESTHESIA;  Surgeon: Marcello Moores A. Cornett, MD;  Location: [facility];  Service: General;  Laterality: N/A;  . INCISION AND DRAINAGE PERIRECTAL ABSCESS  ~ 2010; 2015  . INGUINAL HERNIA REPAIR Left 1980  . LEFT HEART CATHETERIZATION WITH CORONARY ANGIOGRAM N/A 01/27/2012   Procedure: LEFT HEART CATHETERIZATION WITH CORONARY ANGIOGRAM;  Surgeon: Troy Sine, MD;  Location: [facility] CATH LAB;  Service: Cardiovascular;  Laterality: N/A;  . NM MYOCAR PERF WALL MOTION  01/21/2012   mod-severe perfusion defect due to infarct/scar w/mild perinfarct ischemia in the apical,basal inferoseptal,basal inferior,mid inferoseptal,midinferior and apical inferior regions  . pacemaker battery    . PERMANENT PACEMAKER INSERTION N/A 12/01/2011   Medtronic implanted by Dr Sallyanne Kuster  . PROSTATE BIOPSY    . PROSTATE BIOPSY    . shrapnel surgery     "in [place]"  . TONSILLECTOMY      Family History  Problem Relation Age of Onset  . Ovarian cancer Mother   . Cancer Mother        ?uterine  . Leukemia Father   . Heart disease Father   . Diabetes Neg Hx     Social History:  reports that he quit smoking about 37 years ago. His smoking use included cigarettes. He has a 20.00 pack-year smoking history. He has never used smokeless tobacco. He reports current alcohol use. He reports that he does not use drugs.    Review of Systems  .   He has eye exams every 6 months regularly at [facility],  no records available      Lipids: He has been treated with Lipitor 40 mg with adequate control       Lab Results  Component Value Date   CHOL 103 09/24/2020   HDL 39.10 09/24/2020   LDLCALC 39 09/24/2020   LDLDIRECT 58.6 09/24/2014   TRIG 126.0 09/24/2020   CHOLHDL 3 09/24/2020     Hypertension with history of CHF: Currently not on any medications Previously on carvedilol Also has had a pacemaker  Home BP checked by his wife periodically, home readings generally 120s/70s  BP Readings from Last 3 Encounters:  04/15/21 124/72  03/25/21 120/70  03/23/21 132/68   History of episodes of dizziness and falls: His wife thinks this is from vertigo but not clear if this has been evaluated  Weight loss: He says that he has had decreased taste and not able to eat much at a time This is only for the last several weeks, Synjardy was started last year Recently weight appears to be leveled off and no etiology found by PCP  Lab Results  Component Value Date   TSH 0.68 03/13/2021        Physical Examination:     BP 124/72   Pulse 90   Ht 6\' 3"  (1.905 m)   Wt 161 lb 4 oz (73.1 kg)   SpO2 98%   BMI 20.15 kg/m   ASSESSMENT/PLAN:     Diabetes type 2, Nonobese  See history of present illness for detailed discussion  of current diabetes management, blood sugar patterns and problems identified     His A1c is now 6.5  For cardiovascular risk reduction and history of CHF he is on Synjardy instead of Janumet  His blood sugars are somewhat variable and occasionally higher after meals but these are checked very rarely on his freestyle libre  Also he thinks he can likely improve his diet with cutting back on sweets  Recommendations:  He will need to check his blood sugars twice daily at least including after meals  His wife can help him remember to do this  Regular exercise  Avoid high carbohydrate foods and drinks and minimize cereal and milk shakes  Since he sometimes  feels dizzy which is of unclear etiology he can try taking Synjardy 1 tablet twice a day instead of 2 tablets in the morning  Dizzy spells and falls: This may be related to vertigo and he needs to discuss further with his PCP  WEIGHT loss and decreased appetite and abnormal taste: Explained that  that this is unlikely to be from [place] entirely especially recently  Discussed that he needs to talk to his psychiatrist and PCP whether it could be related to higher doses of Wellbutrin Also his weight loss appears to have leveled off and he can try and increase his calorie intake with more snacks and adding Glucerna regularly  Patient Instructions  Synjardy 1 in am and 1 at dinner  Check blood sugars on waking up 2-3 days a week  Also check blood sugars about 2 hours after meals and do this after different meals by rotation  Recommended blood sugar levels on waking up are 90-130 and about 2 hours after meal is 130-160  Please bring your blood sugar monitor to each visit, thank you     Elayne Snare 04/15/2021, 4:30 PM     Note: This office note was prepared with Dragon voice recognition system technology. Any transcriptional errors that result from this process are unintentional.

## 2021-04-15 NOTE — Patient Instructions (Addendum)
Synjardy 1 in am and 1 at dinner  Check blood sugars on waking up 2-3 days a week  Also check blood sugars about 2 hours after meals and do this after different meals by rotation  Recommended blood sugar levels on waking up are 90-130 and about 2 hours after meal is 130-160  Please bring your blood sugar monitor to each visit, thank you

## 2021-05-01 ENCOUNTER — Telehealth: Payer: Self-pay | Admitting: Neurology

## 2021-05-01 NOTE — Telephone Encounter (Signed)
Kevin Shown KeyIrving [name] - Rx #: [id number] Need help? Call us at [phone] Outcome Additional Information Required The patient currently has access to the requested medication and a Prior Authorization is not needed for the patient/medication. Drug buPROPion HCl ER (XL) 150MG  er tablets [employer]chant [employer] officer Medicare Electronic PA Form [id number] NCPDP) Original Claim Info [id number] MD CALL [phone] DAILY DOSE OF 2.0000(PHARMACY HELP DESK [phone])  Called [employer] and Josem Kaufmann [id number] is on file  Called [facility] and RX has been filled and picked up

## 2021-05-01 NOTE — Telephone Encounter (Signed)
Authorization received via fax and sent to scan  05/01/2021

## 2021-05-21 ENCOUNTER — Telehealth: Payer: Self-pay

## 2021-05-21 NOTE — Telephone Encounter (Signed)
LMOVM for patient to send missed ICM transmission. 

## 2021-05-22 NOTE — Progress Notes (Signed)
No ICM remote transmission received for 05/18/2021 and next ICM transmission scheduled for 06/15/2021.

## 2021-05-25 ENCOUNTER — Telehealth: Payer: Self-pay | Admitting: Internal Medicine

## 2021-05-25 NOTE — Telephone Encounter (Signed)
I called the patient and let him know that particular transmission is on a Monday. He can send anytime that day. The patient agreed to send on that Monday.

## 2021-05-25 NOTE — Telephone Encounter (Signed)
Kevin [name], Kevin [name]  Kevin [name], Kevin [name] 3 days ago   I 'am out of town until next Sunday please contact me on Monday thanks     Kevin [name], Kevin [name]  Kevin [name], Kevin [name] 3 days ago   GM  Appointment Information:     Visit Type: HOME - Remote Defib CK         Date: 06/15/2021                 Dept: [place] [facility]                 Provider: [facility] Device Remotes                 Time: 7:10 AM                 Length: 5 min   Appt Status: Scheduled     Per my chart message , pt is out of town and would like to send this in on Monday when he gets back in town.

## 2021-06-02 ENCOUNTER — Ambulatory Visit (INDEPENDENT_AMBULATORY_CARE_PROVIDER_SITE_OTHER): Payer: Medicare HMO

## 2021-06-02 DIAGNOSIS — I5042 Chronic combined systolic (congestive) and diastolic (congestive) heart failure: Secondary | ICD-10-CM

## 2021-06-02 LAB — CUP PACEART REMOTE DEVICE CHECK
Battery Remaining Longevity: 71 mo
Battery Voltage: 3 V
Brady Statistic AP VP Percent: 0.39 %
Brady Statistic AP VS Percent: 0.01 %
Brady Statistic AS VP Percent: 99.6 %
Brady Statistic AS VS Percent: 0 %
Brady Statistic RA Percent Paced: 0.4 %
Brady Statistic RV Percent Paced: 99.98 %
Date Time Interrogation Session: 20220726123526
HighPow Impedance: 72 Ohm
Implantable Lead Implant Date: 20130123
Implantable Lead Implant Date: 20160316
Implantable Lead Implant Date: 20160316
Implantable Lead Location: 753858
Implantable Lead Location: 753859
Implantable Lead Location: 753860
Implantable Lead Model: 4598
Implantable Lead Model: 5076
Implantable Lead Model: 6935
Implantable Pulse Generator Implant Date: 20211021
Lead Channel Impedance Value: 1007 Ohm
Lead Channel Impedance Value: 1064 Ohm
Lead Channel Impedance Value: 1064 Ohm
Lead Channel Impedance Value: 245.538
Lead Channel Impedance Value: 245.538
Lead Channel Impedance Value: 270.508
Lead Channel Impedance Value: 295.556
Lead Channel Impedance Value: 295.556
Lead Channel Impedance Value: 342 Ohm
Lead Channel Impedance Value: 361 Ohm
Lead Channel Impedance Value: 399 Ohm
Lead Channel Impedance Value: 456 Ohm
Lead Channel Impedance Value: 532 Ohm
Lead Channel Impedance Value: 532 Ohm
Lead Channel Impedance Value: 608 Ohm
Lead Channel Impedance Value: 665 Ohm
Lead Channel Impedance Value: 874 Ohm
Lead Channel Impedance Value: 893 Ohm
Lead Channel Pacing Threshold Amplitude: 0.5 V
Lead Channel Pacing Threshold Amplitude: 0.5 V
Lead Channel Pacing Threshold Amplitude: 1.25 V
Lead Channel Pacing Threshold Pulse Width: 0.4 ms
Lead Channel Pacing Threshold Pulse Width: 0.4 ms
Lead Channel Pacing Threshold Pulse Width: 1 ms
Lead Channel Sensing Intrinsic Amplitude: 3.375 mV
Lead Channel Sensing Intrinsic Amplitude: 3.375 mV
Lead Channel Setting Pacing Amplitude: 1.5 V
Lead Channel Setting Pacing Amplitude: 1.75 V
Lead Channel Setting Pacing Amplitude: 2.5 V
Lead Channel Setting Pacing Pulse Width: 0.4 ms
Lead Channel Setting Pacing Pulse Width: 1 ms
Lead Channel Setting Sensing Sensitivity: 0.3 mV

## 2021-06-10 ENCOUNTER — Ambulatory Visit: Payer: Medicare HMO

## 2021-06-10 ENCOUNTER — Other Ambulatory Visit: Payer: Self-pay

## 2021-06-16 ENCOUNTER — Encounter: Payer: Self-pay | Admitting: Internal Medicine

## 2021-06-16 ENCOUNTER — Other Ambulatory Visit: Payer: Self-pay | Admitting: Internal Medicine

## 2021-06-16 DIAGNOSIS — U071 COVID-19: Secondary | ICD-10-CM | POA: Insufficient documentation

## 2021-06-16 MED ORDER — PAXLOVID 20 X 150 MG & 10 X 100MG PO TBPK: 2.0000 | ORAL_TABLET | Freq: Two times a day (BID) | ORAL | 0 refills | Status: AC

## 2021-06-16 MED ORDER — PROMETHAZINE HCL 12.5 MG PO TABS
12.5000 mg | ORAL_TABLET | Freq: Four times a day (QID) | ORAL | 0 refills | Status: DC | PRN
Start: 2021-06-16 — End: 2021-11-03

## 2021-06-17 ENCOUNTER — Other Ambulatory Visit: Payer: Self-pay

## 2021-06-17 ENCOUNTER — Telehealth (INDEPENDENT_AMBULATORY_CARE_PROVIDER_SITE_OTHER): Payer: Medicare HMO | Admitting: Family Medicine

## 2021-06-17 DIAGNOSIS — R634 Abnormal weight loss: Secondary | ICD-10-CM | POA: Diagnosis not present

## 2021-06-17 DIAGNOSIS — U071 COVID-19: Secondary | ICD-10-CM

## 2021-06-17 NOTE — Progress Notes (Signed)
Patient ID: Kevin [name], male   DOB: 1946-01-08, 75 y.o.   MRN: [id number]  This visit type was conducted due to national recommendations for restrictions regarding the COVID-19 pandemic in an effort to limit this patient's exposure and mitigate transmission in our community.   Virtual Visit via Telephone Note  I connected with Marice Vaclavik on 06/17/21 at  8:45 AM EDT by telephone and verified that I am speaking with the correct person using two identifiers.   I discussed the limitations, risks, security and privacy concerns of performing an evaluation and management service by telephone and the availability of in person appointments. I also discussed with the patient that there may be a patient responsible charge related to this service. The patient expressed understanding and agreed to proceed.  Location patient: home Location provider: work or home office Participants present for the call: patient, provider Patient did not have a visit in the prior 7 days to address this/these issue(s).   History of Present Illness: Kevin [name] was actually scheduled to come into office today.  However, he tested positive for COVID on Monday.  He developed some cough over the weekend.  Some mild headache and congestion nasally.  No respiratory distress.  Really does not feel that poorly.  On-call provider last night actually sent in [place].  He has not started that yet.  He was also instructed to hold his atorvastatin and Viagra while taking the medication.  He had been scheduled to see in our office because of weight loss which is ongoing.  Wife states his weight is currently down to 156 pounds.  They are using Glucerna.  He has diabetes but this is been well controlled with recent A1c 6.5%.  Recent labs (including thyroid) have been reassuring.  Patient quit smoking 20 years ago.  Past Medical History:  Diagnosis Date   Agent orange exposure    in [place]   Anemia    Heart block AV  second degree    a. s/p PPM implant with subsequent CRTD upgrade   High cholesterol    History of colon polyps    Hypertension    LBBB (left bundle branch block)    Nonischemic cardiomyopathy ([place])    a. MDT CRTD upgrade 2016   Pacemaker 08/27/2013   Dual-chamber Medtronic Adapta implanted January 2013 for bradycardia with alternating bundle branch block and 2:1 atrioventricular block    Prostate cancer ([place])    "not been treated yet; on a wait and see" (01/22/2015)   Type II diabetes mellitus ([place])    Past Surgical History:  Procedure Laterality Date   BASAL CELL CARCINOMA EXCISION     "back, face, neck"   BI-VENTRICULAR IMPLANTABLE CARDIOVERTER DEFIBRILLATOR UPGRADE N/A 01/22/2015   MDT CRTD upgrade by Dr Rayann Heman   BIV ICD GENERATOR CHANGEOUT N/A 08/28/2020   Procedure: BIV ICD GENERATOR CHANGEOUT;  Surgeon: [name], MD;  Location: [facility];  Service: Cardiovascular;  Laterality: N/A;   CARDIAC CATHETERIZATION  01/27/2012   normal coronaries, dilated LV c/w nonischemic cardiomyopathy   EXAMINATION UNDER ANESTHESIA  04/06/2012   Procedure: EXAM UNDER ANESTHESIA;  Surgeon: Marcello Moores A. Cornett, MD;  Location: [facility];  Service: General;  Laterality: N/A;   INCISION AND DRAINAGE PERIRECTAL ABSCESS  ~ 2010; 2015   INGUINAL HERNIA REPAIR Left 1980   LEFT HEART CATHETERIZATION WITH CORONARY ANGIOGRAM N/A 01/27/2012   Procedure: LEFT HEART CATHETERIZATION WITH CORONARY ANGIOGRAM;  Surgeon: Troy Sine, MD;  Location[facility] C[facility]  LAB;  Service: Cardiovascular;  Laterality: N/A;   NM MYOCAR PERF WALL MOTION  01/21/2012   mod-severe perfusion defect due to infarct/scar w/mild perinfarct ischemia in the apical,basal inferoseptal,basal inferior,mid inferoseptal,midinferior and apical inferior regions   pacemaker battery     PERMANENT PACEMAKER INSERTION N/A 12/01/2011   Medtronic implanted by Dr Sallyanne Kuster   PROSTATE BIOPSY     PROSTATE BIOPSY     shrapnel surgery     "in [place]"    TONSILLECTOMY      reports that he quit smoking about 37 years ago. His smoking use included cigarettes. He has a 20.00 pack-year smoking history. He has never used smokeless tobacco. He reports current alcohol use. He reports that he does not use drugs. family history includes Cancer in his mother; Heart disease in his father; Leukemia in his father; Ovarian cancer in his mother. Allergies  Allergen Reactions   Sulfa Antibiotics Swelling    Swelling of lips only.   5-Alpha Reductase Inhibitors       Observations/Objective: Patient sounds cheerful and well on the phone. I do not appreciate any SOB. Speech and thought processing are grossly intact. Patient reported vitals:  Assessment and Plan:  #1 COVID-19 infection   patient already sent in antiviral prescription yesterday which he plans to start today.  We did reemphasize importance of holding atorvastatin over the next 10 days  #2 ongoing weight loss.  This is unintentional.  Current weight reported 156  -Recommend PA and lateral chest x-ray but wait till next week to get after he is over isolation.  For COVID. -They will reschedule office follow-up in a couple weeks after he has gotten over his acute infection  Follow Up Instructions:    [id number] 5-10 [id number] 11-20 [id number] 21-30 I did not refer this patient for an OV in the next 24 hours for this/these issue(s).  I discussed the assessment and treatment plan with the patient. The patient was provided an opportunity to ask questions and all were answered. The patient agreed with the plan and demonstrated an understanding of the instructions.   The patient was advised to call back or seek an in-person evaluation if the symptoms worsen or if the condition fails to improve as anticipated.  I provided 14 minutes of non-face-to-face time during this encounter.   Carolann Littler, MD

## 2021-06-18 ENCOUNTER — Telehealth: Payer: Self-pay

## 2021-06-18 NOTE — Telephone Encounter (Signed)
LMOVM for patient to send missed ICM transmission. 

## 2021-06-19 NOTE — Progress Notes (Signed)
No ICM remote transmission received for 06/15/2021 and next ICM transmission scheduled for 07/20/2021.

## 2021-06-23 ENCOUNTER — Ambulatory Visit: Payer: Medicare HMO

## 2021-06-23 ENCOUNTER — Other Ambulatory Visit: Payer: Self-pay

## 2021-06-23 ENCOUNTER — Telehealth: Payer: Self-pay

## 2021-06-23 NOTE — Telephone Encounter (Signed)
Left message for patient to call back  

## 2021-06-23 NOTE — Telephone Encounter (Signed)
Patient called the after hours line and stated that he has been taking the antiviral medication but is still testing positive.   Please advise.

## 2021-06-26 NOTE — Progress Notes (Signed)
Remote ICD transmission.   

## 2021-07-03 ENCOUNTER — Telehealth: Payer: Self-pay | Admitting: Family Medicine

## 2021-07-03 NOTE — Telephone Encounter (Signed)
Left message for patient to call back and schedule Medicare Annual Wellness Visit (AWV) either virtually or in office. Left  my Herbie Drape number [phone]   Last AWV 06/04/20  please schedule at anytime with [facility] Nurse Health Advisor 1 or 2   This should be a 45 minute visit.

## 2021-07-14 ENCOUNTER — Ambulatory Visit (INDEPENDENT_AMBULATORY_CARE_PROVIDER_SITE_OTHER): Payer: Medicare HMO

## 2021-07-14 ENCOUNTER — Other Ambulatory Visit: Payer: Self-pay

## 2021-07-14 DIAGNOSIS — Z5329 Procedure and treatment not carried out because of patient's decision for other reasons: Secondary | ICD-10-CM

## 2021-07-14 DIAGNOSIS — Z0000 Encounter for general adult medical examination without abnormal findings: Secondary | ICD-10-CM

## 2021-07-16 ENCOUNTER — Telehealth: Payer: Self-pay | Admitting: Family Medicine

## 2021-07-16 NOTE — Telephone Encounter (Signed)
Tried calling patient to schedule Medicare Annual Wellness Visit (AWV) either virtually or in office. Left  my Herbie Drape number [phone]  No answer   Last AWV 06/04/20\  please schedule at anytime with [facility] Nurse Health Advisor 1 or 2   This should be a 45 minute visit.

## 2021-07-21 ENCOUNTER — Telehealth: Payer: Self-pay | Admitting: *Deleted

## 2021-07-21 NOTE — Telephone Encounter (Signed)
Called to schedule medicare AWV and mailbox if full

## 2021-07-22 ENCOUNTER — Telehealth: Payer: Self-pay | Admitting: Pharmacist

## 2021-07-22 ENCOUNTER — Telehealth: Payer: Self-pay

## 2021-07-22 NOTE — Chronic Care Management (AMB) (Signed)
Chronic Care Management Pharmacy Assistant   Name: Kevin [name]  MRN: [id number] DOB: 02/24/1946   Reason for Encounter: Disease State / Diabetes Assessment Call    Conditions to be addressed/monitored: DMII  Recent office visits:  06-13-2021 Willette Brace, LPN - Patient presented for Medicare Annual Wellness exam. No medication changes.  06-17-2021 Burchette, Alinda Sierras, MD - Patient presented via tele-health for Weight loss and other concerns. No medication changes.   Recent consult visits:  06-19-2021 [facility]  - Patient presented for Optometry visit. No other visit details available.   06-02-2021 Simone Curia, RN [facility]) - Patient presented for Chronic combined systolic and diastolic heart failure and paceart device check. No medication changes.  04-15-2021 Elayne Snare, MD (endocrinology) - Patient presented for Uncontrolled type 2 diabetes mellitus with hyperglycemia, without long-term current use of insulin  and other concerns. Prescribed Carvedilol 6.5 mg.   Hospital visits:  None in previous 6 months  Medications: Outpatient Encounter Medications as of 07/22/2021  Medication Sig   ACCU-CHEK COMPACT PLUS test strip use 1 TEST STRIP once daily as directed   atorvastatin (LIPITOR) 80 MG tablet Take 40 mg by mouth daily at 6 PM.   buPROPion (WELLBUTRIN XL) 150 MG 24 hr tablet Take 2 tablets in AM, 1 tablet in PM   CHELATED MAGNESIUM PO Take 250 mg by mouth daily.   clobetasol ointment (TEMOVATE) [id number] % Apply 1 application topically daily as needed (itching).    clopidogrel (PLAVIX) 75 MG tablet TAKE 1 TABLET(75 MG) BY MOUTH DAILY   Continuous Blood Gluc Receiver (FREESTYLE LIBRE 14 DAY READER) DEVI 1 each by Does not apply route See admin instructions. Use Freestyle Libre Reader to monitor blood sugars.   Continuous Blood Gluc Sensor (FREESTYLE LIBRE 14 DAY SENSOR) MISC Change every 14 days as directed or needed   donepezil (ARICEPT) 5 MG tablet  Take 1 tablet (5 mg total) by mouth daily.   LORazepam (ATIVAN) 0.5 MG tablet Take 0.5 mg by mouth every 8 (eight) hours as needed for anxiety.    memantine (NAMENDA) 10 MG tablet TAKE 1 TABLET BY MOUTH EVERY NIGHT FOR 2 WEEKS. INCREASE TO 1 TABLET 2 TIMES A DAY   promethazine (PHENERGAN) 12.5 MG tablet Take 1 tablet (12.5 mg total) by mouth every 6 (six) hours as needed for nausea or vomiting.   SYNJARDY XR 03-999 MG TB24 TAKE 2 TABLETS BY MOUTH DAILY   VIAGRA 100 MG tablet TAKE ONE TABLET BY MOUTH AS NEEDED FOR ERECTILE DYSFUNCTION. (Patient taking differently: Take 50 mg by mouth as needed for erectile dysfunction.)   No facility-administered encounter medications on file as of 07/22/2021.  Recent Relevant Labs: Lab Results  Component Value Date/Time   HGBA1C 6.5 (A) 04/15/2021 11:25 AM   HGBA1C 6.1 (A) 09/24/2020 02:20 PM   HGBA1C 6.5 11/30/2019 11:58 AM   HGBA1C 6.7 (H) 07/20/2019 08:48 AM   MICROALBUR 2.5 (H) 09/24/2020 02:27 PM   MICROALBUR 2.3 (H) 07/20/2019 08:48 AM    Kidney Function Lab Results  Component Value Date/Time   CREATININE 1.22 03/13/2021 10:05 AM   CREATININE 1.35 11/12/2020 01:42 PM   CREATININE 1.15 09/02/2016 08:19 AM   CREATININE 0.96 04/16/2016 12:08 PM   GFR 58.08 (L) 03/13/2021 10:05 AM   GFRNONAA 52 (L) 08/15/2020 10:06 AM   GFRAA 60 08/15/2020 10:06 AM    Current antihyperglycemic regimen:  SYNJARDY XR  - Take 2 tablets by mouth daily What recent interventions/DTPs have  been made to improve glycemic control:  Patient reports no changes Have there been any recent hospitalizations or ED visits since last visit with CPP? Patient reports no changes. Patient denies hypoglycemic symptoms, including Pale, Sweaty, Shaky, Hungry, Nervous/irritable, and Vision changes Patient denies hyperglycemic symptoms, including blurry vision, excessive thirst, fatigue, polyuria, and weakness How often are you checking your blood sugar? twice daily What are your blood  sugars ranging? This morning was 121 is typically what he ranges. During the week, how often does your blood glucose drop below 70? Patient reports he had it drop to 66 last week. He reports he works out 4 times a week and walks about 20 miles per week. He reports that day he had overdone it and it was very hot out. He reports no issues since. Are you checking your feet daily/regularly? Patient reports feet are doing well also.  Adherence Review: Is the patient currently on a STATIN medication? Yes Is the patient currently on ACE/ARB medication? No Does the patient have >5 day gap between last estimated fill dates? No   Care Gaps: Hepatitis C Screening - Overdue Zoster Vaccine - Overdue Colonoscopy - Overdue COVID Booster #3 Therapist, music) - Overdue Flu Vaccine - Overdue CCM follow up  11-17-21  Star Rating Drugs:  Empagliflozin-metformin HCL (Synjardy XR) 1000 mg - Last filled 07-01-2021 30 DS at [place] Pharmacist Assistant [phone]        Time Spent 41

## 2021-07-22 NOTE — Telephone Encounter (Signed)
The patient states he was at the hospital but when he get home he will send the transmission.

## 2021-07-24 NOTE — Progress Notes (Signed)
No ICM remote transmission received for 07/20/2021 and next ICM transmission scheduled for 08/24/2021.

## 2021-07-29 ENCOUNTER — Telehealth: Payer: Self-pay | Admitting: Neurology

## 2021-07-29 DIAGNOSIS — R42 Dizziness and giddiness: Secondary | ICD-10-CM

## 2021-07-29 DIAGNOSIS — H9313 Tinnitus, bilateral: Secondary | ICD-10-CM

## 2021-07-29 NOTE — Telephone Encounter (Signed)
Has he seen ENT? Would he like a referral to an ear doctor? thanks

## 2021-07-29 NOTE — Telephone Encounter (Signed)
Pt called he wasn't able to talk he was volunteering , he stated he would call me back later,

## 2021-07-29 NOTE — Telephone Encounter (Signed)
Pt said he is feeling dizzy and ringing in his ears. He has fallen. Michela Pitcher its his inner ear problem. He wanted to let aquino know.

## 2021-07-30 ENCOUNTER — Telehealth: Payer: Self-pay | Admitting: Family Medicine

## 2021-07-30 NOTE — Telephone Encounter (Signed)
Left message for patient to call back and schedule Medicare Annual Wellness Visit (AWV) either virtually or in office. Left  my Kevin [name] number [phone]   Last AWV 06/04/20  please schedule at anytime with [facility] Nurse Health Advisor 1 or 2   This should be a 45 minute visit.

## 2021-07-30 NOTE — Telephone Encounter (Signed)
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

## 2021-07-30 NOTE — Addendum Note (Signed)
Addended by: Jake Seats on: 07/30/2021 10:12 AM   Modules accepted: Orders

## 2021-08-03 ENCOUNTER — Other Ambulatory Visit: Payer: Self-pay | Admitting: Endocrinology

## 2021-08-10 ENCOUNTER — Other Ambulatory Visit: Payer: Self-pay

## 2021-08-10 ENCOUNTER — Other Ambulatory Visit (INDEPENDENT_AMBULATORY_CARE_PROVIDER_SITE_OTHER): Payer: Medicare HMO

## 2021-08-10 DIAGNOSIS — E1165 Type 2 diabetes mellitus with hyperglycemia: Secondary | ICD-10-CM | POA: Diagnosis not present

## 2021-08-10 DIAGNOSIS — E7800 Pure hypercholesterolemia, unspecified: Secondary | ICD-10-CM

## 2021-08-10 DIAGNOSIS — E119 Type 2 diabetes mellitus without complications: Secondary | ICD-10-CM

## 2021-08-10 LAB — LIPID PANEL
Cholesterol: 94 mg/dL (ref 0–200)
HDL: 42.3 mg/dL (ref >39.00)
LDL Cholesterol: 29 mg/dL (ref 0–99)
NonHDL: 51.93
Total CHOL/HDL Ratio: 2
Triglycerides: 116 mg/dL (ref 0.0–149.0)
VLDL: 23.2 mg/dL (ref 0.0–40.0)

## 2021-08-10 LAB — BASIC METABOLIC PANEL
BUN: 10 mg/dL (ref 6–23)
CO2: 27 mEq/L (ref 19–32)
Calcium: 9.1 mg/dL (ref 8.4–10.5)
Chloride: 106 mEq/L (ref 96–112)
Creatinine, Ser: 1.14 mg/dL (ref 0.40–1.50)
GFR: 62.82 mL/min (ref >60.00)
Glucose, Bld: 190 mg/dL — ABNORMAL HIGH (ref 70–99)
Potassium: 4.1 mEq/L (ref 3.5–5.1)
Sodium: 140 mEq/L (ref 135–145)

## 2021-08-10 LAB — HEMOGLOBIN A1C: Hgb A1c MFr Bld: 6.3 % (ref 4.6–6.5)

## 2021-08-13 ENCOUNTER — Ambulatory Visit: Payer: Medicare HMO | Admitting: Endocrinology

## 2021-08-17 NOTE — Progress Notes (Signed)
This encounter was created in error - please disregard.

## 2021-08-18 ENCOUNTER — Encounter: Payer: Self-pay | Admitting: Endocrinology

## 2021-08-18 ENCOUNTER — Encounter: Payer: Medicare HMO | Admitting: Endocrinology

## 2021-08-19 ENCOUNTER — Telehealth: Payer: Self-pay

## 2021-08-19 NOTE — Telephone Encounter (Signed)
Last OV for medical conditions 11/05/20. Pt needs OV for chronic conditions scheduled before the end of the year.

## 2021-08-24 ENCOUNTER — Ambulatory Visit (INDEPENDENT_AMBULATORY_CARE_PROVIDER_SITE_OTHER): Payer: Medicare HMO

## 2021-08-24 DIAGNOSIS — Z95810 Presence of automatic (implantable) cardiac defibrillator: Secondary | ICD-10-CM | POA: Diagnosis not present

## 2021-08-24 DIAGNOSIS — I5042 Chronic combined systolic (congestive) and diastolic (congestive) heart failure: Secondary | ICD-10-CM | POA: Diagnosis not present

## 2021-08-27 ENCOUNTER — Telehealth: Payer: Self-pay | Admitting: Endocrinology

## 2021-08-27 NOTE — Telephone Encounter (Signed)
Patient dismissed from [facility] by Elayne Snare, MD, effective 08/18/21. Dismissal Letter sent out by 1st class mail. [employer]

## 2021-08-28 ENCOUNTER — Telehealth: Payer: Self-pay | Admitting: Endocrinology

## 2021-08-28 DIAGNOSIS — E1165 Type 2 diabetes mellitus with hyperglycemia: Secondary | ICD-10-CM

## 2021-08-28 NOTE — Telephone Encounter (Signed)
Pt taking SYNJARDY XR 03-999 MG TB24 and would like to know if it can be prescribed to the [place] pharmacy to make it cheaper for him.   Pt contact [phone]

## 2021-08-28 NOTE — Progress Notes (Signed)
EPIC Encounter for ICM Monitoring  Patient Name: Kevin [name] is a 75 y.o. male Date: 08/28/2021 Primary Care Physican: [name], MD Primary Cardiologist: [name] Electrophysiologist: [name] Bi-V Pacing:  100% 06/17/2021 Office Weight: 156 lbs     Transmission reviewed.    Optivol Thoracic impedance suggesting possible dryness starting 08/17/2021 but trending back toward baseline.     Labs: 03/13/2021 Creatinine 1.22, BUN 11, Potassium 4.8, Sodium 142, GFR 58.08 A complete set of results can be found in Results Review.   Patient does not take a diuretic.   Recommendations:  No changes.   Follow-up plan: ICM clinic phone appointment on 10/05/2021.   91 day device clinic remote transmission 09/01/2021.       EP/Cardiology Office Visits:  Recall 12/17/2021 with Oda Kilts, PA.   Copy of ICM check sent to Dr. Rayann Heman.    3 month ICM trend: 08/24/2021.    1 Year ICM trend:       Rosalene Billings, RN 08/28/2021 9:06 AM

## 2021-08-31 MED ORDER — SYNJARDY XR 5-1000 MG PO TB24: 2.0000 | ORAL_TABLET | Freq: Every day | ORAL | 2 refills | Status: DC

## 2021-08-31 NOTE — Telephone Encounter (Signed)
Called patient to get fax number for the pharmacy he would like Rx sent. Patient states that he wants Rx to be sent to [facility] on [address]. Rx sent

## 2021-09-01 ENCOUNTER — Ambulatory Visit (INDEPENDENT_AMBULATORY_CARE_PROVIDER_SITE_OTHER): Payer: Medicare HMO

## 2021-09-01 DIAGNOSIS — I428 Other cardiomyopathies: Secondary | ICD-10-CM | POA: Diagnosis not present

## 2021-09-01 LAB — CUP PACEART REMOTE DEVICE CHECK
Battery Remaining Longevity: 64 mo
Battery Voltage: 2.99 V
Brady Statistic AP VP Percent: 0.04 %
Brady Statistic AP VS Percent: 0.01 %
Brady Statistic AS VP Percent: 99.95 %
Brady Statistic AS VS Percent: 0 %
Brady Statistic RA Percent Paced: 0.05 %
Brady Statistic RV Percent Paced: 99.98 %
Date Time Interrogation Session: 20221025063527
HighPow Impedance: 74 Ohm
Implantable Lead Implant Date: 20130123
Implantable Lead Implant Date: 20160316
Implantable Lead Implant Date: 20160316
Implantable Lead Location: 753858
Implantable Lead Location: 753859
Implantable Lead Location: 753860
Implantable Lead Model: 4598
Implantable Lead Model: 5076
Implantable Lead Model: 6935
Implantable Pulse Generator Implant Date: 20211021
Lead Channel Impedance Value: 1026 Ohm
Lead Channel Impedance Value: 1026 Ohm
Lead Channel Impedance Value: 230.327
Lead Channel Impedance Value: 230.327
Lead Channel Impedance Value: 253.786
Lead Channel Impedance Value: 285.934
Lead Channel Impedance Value: 285.934
Lead Channel Impedance Value: 361 Ohm
Lead Channel Impedance Value: 361 Ohm
Lead Channel Impedance Value: 399 Ohm
Lead Channel Impedance Value: 418 Ohm
Lead Channel Impedance Value: 513 Ohm
Lead Channel Impedance Value: 513 Ohm
Lead Channel Impedance Value: 608 Ohm
Lead Channel Impedance Value: 646 Ohm
Lead Channel Impedance Value: 817 Ohm
Lead Channel Impedance Value: 836 Ohm
Lead Channel Impedance Value: 988 Ohm
Lead Channel Pacing Threshold Amplitude: 0.5 V
Lead Channel Pacing Threshold Amplitude: 0.5 V
Lead Channel Pacing Threshold Amplitude: 1.375 V
Lead Channel Pacing Threshold Pulse Width: 0.4 ms
Lead Channel Pacing Threshold Pulse Width: 0.4 ms
Lead Channel Pacing Threshold Pulse Width: 1 ms
Lead Channel Sensing Intrinsic Amplitude: 2.375 mV
Lead Channel Sensing Intrinsic Amplitude: 2.375 mV
Lead Channel Setting Pacing Amplitude: 1.5 V
Lead Channel Setting Pacing Amplitude: 2 V
Lead Channel Setting Pacing Amplitude: 2.5 V
Lead Channel Setting Pacing Pulse Width: 0.4 ms
Lead Channel Setting Pacing Pulse Width: 1 ms
Lead Channel Setting Sensing Sensitivity: 0.3 mV

## 2021-09-09 NOTE — Progress Notes (Signed)
Remote ICD transmission.   

## 2021-09-10 ENCOUNTER — Other Ambulatory Visit: Payer: Self-pay

## 2021-09-10 ENCOUNTER — Encounter: Payer: Self-pay | Admitting: Neurology

## 2021-09-10 ENCOUNTER — Ambulatory Visit: Payer: Medicare HMO | Admitting: Neurology

## 2021-09-10 VITALS — BP 120/70 | HR 71 | Ht 76.0 in | Wt 160.8 lb

## 2021-09-10 DIAGNOSIS — R42 Dizziness and giddiness: Secondary | ICD-10-CM

## 2021-09-10 DIAGNOSIS — F01A3 Vascular dementia, mild, with mood disturbance: Secondary | ICD-10-CM | POA: Diagnosis not present

## 2021-09-10 DIAGNOSIS — Z8673 Personal history of transient ischemic attack (TIA), and cerebral infarction without residual deficits: Secondary | ICD-10-CM

## 2021-09-10 DIAGNOSIS — R296 Repeated falls: Secondary | ICD-10-CM

## 2021-09-10 DIAGNOSIS — R69 Illness, unspecified: Secondary | ICD-10-CM | POA: Diagnosis not present

## 2021-09-10 MED ORDER — MEMANTINE HCL 10 MG PO TABS: ORAL_TABLET | ORAL | 3 refills | Status: DC

## 2021-09-10 MED ORDER — BUPROPION HCL ER (XL) 150 MG PO TB24: ORAL_TABLET | ORAL | 3 refills | Status: DC

## 2021-09-10 MED ORDER — CLOPIDOGREL BISULFATE 75 MG PO TABS: ORAL_TABLET | ORAL | 3 refills | Status: DC

## 2021-09-10 MED ORDER — DONEPEZIL HCL 5 MG PO TABS: 5.0000 mg | ORAL_TABLET | Freq: Every day | ORAL | 3 refills | Status: DC

## 2021-09-10 NOTE — Patient Instructions (Signed)
Continue all your medications  2. Schedule Neurocognitive testing  3. Follow-up after testing, call for any changes  You have been referred for a neuropsychological evaluation (i.e., evaluation of memory and thinking abilities). Please bring someone with you to this appointment if possible, as it is helpful for the doctor to hear from both you and another adult who knows you well. Please bring eyeglasses and hearing aids if you wear them.    The evaluation will take approximately 3 hours and has two parts:   The first part is a clinical interview with the neuropsychologist (Dr. Melvyn Novas or Dr. Nicole Kindred). During the interview, the neuropsychologist will speak with you and the individual you brought to the appointment.    The second part of the evaluation is testing with the doctor's technician Hinton Dyer or Maudie Mercury). During the testing, the technician will ask you to remember different types of material, solve problems, and answer some questionnaires. Your family member will not be present for this portion of the evaluation.   Please note: We must reserve several hours of the neuropsychologist's time and the psychometrician's time for your evaluation appointment. As such, there is a No-Show fee of $100. If you are unable to attend any of your appointments, please contact our office as soon as possible to reschedule.   FALL PRECAUTIONS: Be cautious when walking. Scan the area for obstacles that may increase the risk of trips and falls. When getting up in the mornings, sit up at the edge of the bed for a few minutes before getting out of bed. Consider elevating the bed at the head end to avoid drop of blood pressure when getting up. Walk always in a well-lit room (use night lights in the walls). Avoid area rugs or power cords from appliances in the middle of the walkways. Use a walker or a cane if necessary and consider physical therapy for balance exercise. Get your eyesight checked regularly.  FINANCIAL  OVERSIGHT: Supervision, especially oversight when making financial decisions or transactions is also recommended.  HOME SAFETY: Consider the safety of the kitchen when operating appliances like stoves, microwave oven, and blender. Consider having supervision and share cooking responsibilities until no longer able to participate in those. Accidents with firearms and other hazards in the house should be identified and addressed as well.  DRIVING: Regarding driving, in patients with progressive memory problems, driving will be impaired. We advise to have someone else do the driving if trouble finding directions or if minor accidents are reported. Independent driving assessment is available to determine safety of driving.  ABILITY TO BE LEFT ALONE: If patient is unable to contact 911 operator, consider using LifeLine, or when the need is there, arrange for someone to stay with patients. Smoking is a fire hazard, consider supervision or cessation. Risk of wandering should be assessed by caregiver and if detected at any point, supervision and safe proof recommendations should be instituted.  MEDICATION SUPERVISION: Inability to self-administer medication needs to be constantly addressed. Implement a mechanism to ensure safe administration of the medications.  RECOMMENDATIONS FOR ALL PATIENTS WITH MEMORY PROBLEMS: 1. Continue to exercise (Recommend 30 minutes of walking everyday, or 3 hours every week) 2. Increase social interactions - continue going to [place] and enjoy social gatherings with friends and family 3. Eat healthy, avoid fried foods and eat more fruits and vegetables 4. Maintain adequate blood pressure, blood sugar, and blood cholesterol level. Reducing the risk of stroke and cardiovascular disease also helps promoting better memory. 5. Avoid stressful  situations. Live a simple life and avoid aggravations. Organize your time and prepare for the next day in anticipation. 6. Sleep well, avoid any  interruptions of sleep and avoid any distractions in the bedroom that may interfere with adequate sleep quality 7. Avoid sugar, avoid sweets as there is a strong link between excessive sugar intake, diabetes, and cognitive impairment The Mediterranean diet has been shown to help patients reduce the risk of progressive memory disorders and reduces cardiovascular risk. This includes eating fish, eat fruits and green leafy vegetables, nuts like almonds and hazelnuts, walnuts, and also use olive oil. Avoid fast foods and fried foods as much as possible. Avoid sweets and sugar as sugar use has been linked to worsening of memory function.  There is always a concern of gradual progression of memory problems. If this is the case, then we may need to adjust level of care according to patient needs. Support, both to the patient and caregiver, should then be put into place.

## 2021-09-10 NOTE — Progress Notes (Signed)
NEUROLOGY FOLLOW UP OFFICE NOTE  Kevin [name] [id number] [birth year]  HISTORY OF PRESENT ILLNESS: I had the pleasure of seeing Kevin [name] in follow-up in the neurology clinic on 09/10/2021.  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 He is again accompanied by his wife who helps supplement the history today.  Records and images were personally reviewed where available.  MMSE 22/30 in 12/2020. He is on Donepezil 5mg  (side effects on higher dose) daily and Memantine 10mg  BID without side effects. He was referred for Balance therapy due to frequent falls. He had worsening memory, confusion, dizziness, headaches, nausea in March 2021, and had a head CT without contrast in April 2021 showing new hypoattenuation in the left occipital lobe concerning for new stroke. 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 He was started on DAPT with aspirin and Plavix to take for 3 months, then Plavix alone. On his last visit, Bupropion was increased to 3 tablets daily due to worsening mood changes.   Since his last visit, his wife had contacted our office that his memory is getting much worse and he did not remember our discussion, he would say he does not know what is happening. He repeats himself several times today that he would like to volunteer at [name] 5 times a week because it helps him exercise. He says "I don't have problems driving," he denies getting lost but admits he does not remember a lot of things he used to. His wife reports that he has gotten lost and forgotten where he is going. She has talked to him about the fact that he probably should not be driving anymore, he states "it's not going to happen." His wife fixes his pillbox, he is good with taking his morning medications but his wife reminds him he forgets his night medications quite often. He  says it is not very often. He denies forgetting bill payments, his wife tells him to tell the truth, he admits he paid a bill twice. She reports he still pays when she tells him not to pay a bill. There does not seem to be much improvement on higher dose of Wellbutrin, he takes 2 tabs in AM, 1 tab in PM. He is "still awfully angry," he replies "What?," and says that things she feels are anger are normal to feel because he is frustrated he cannot do what he used to do. She says he fusses a lot. Sleep is good, sometimes she has given him her Trazodone. No hallucinations. He continues to report dizziness, he initially described it as a sensation of imbalance, but today says he is lightheaded, no spinning. He went to one session of Balance therapy but did not like it and did not go back, he does not remember going to Physical therapy. He says his last fall was 4-5 months ago, his wife reminds him he fell within the past month and everyday he says he is dizzy. He feels when he wears his hearing aids, this keeps his balance going.   History on Initial Assessment 07/14/2018: This is a pleasant 75 year old right-handed man with a history of hypertension, hyperlipidemia, diabetes, prostrate cancer, NICM, second degree AV block s/p PPM, presenting for evaluation of memory changes and dizziness. He and his wife disagree several times during the visit about what each other says. His wife reports  dizziness started 2 years ago and that he complains about it daily to her. He states he does not have it everyday and does not think it's been going on for 2 years. It only occurs when standing, he is fine sitting/supine. Dizziness is described as lightheadedness, he used to play golf but stopped playing because he would transiently feel dizzy. He then reports that he tried to play golf 4-5 weeks ago and did not feel dizzy at all. His wife has checked his BP and glucose during these times and found them normal. He feels that when he  takes his glasses off, the dizziness stops. He had a different type of dizziness the other night when he bent down to brush his teeth and started having a spinning sensation like he would fall. His wife helped him to sit on the commode and her threw up 3 times. Vertigo lasted 5 minutes. No associated focal symptoms. He fell yesterday but states this is not a regular thing. He denies any headaches, diplopia, dysarthria/dysphagia, neck/back pain, focal numbness/tingling/weakness, bowel/bladder dysfunction, anosmia, or tremors. He has tinnitus and has seen ENT, he reports that chelated magnesium and turmeric help.  When asked about memory, he reports he gets frustrated trying to remember names, then it eventually comes to him. His wife started noticing changes 2 years ago where he would repeat himself. She will be going back to work and the other day he asked her at least 5 times if she was working that day. He misplaces things frequently, having a bill or his checkbook and unable to recall where he lay it down. He volunteers at [name] and could not remember someone's name yesterday. He continues to drive and denies getting lost, but his wife shakes her head and states he would be driving and forget how to get to his destination, which frustrates him since he has lived in [name] for a long time. They manage bills together, his wife denies any difficulties. His wife fixes his pillbox, he takes his medications overall fine but sometimes puts them in a packet and takes it later on. He is independent with dressing and bathing. His wife has noticed increased irritability, they have been married for 5 years. No paranoia or hallucinations. No wandering behavior, he sleeps well with melatonin. He takes prn Ativan only when "really frustrated." He is a retired Cytogeneticist. No family history of dementia. He was knocked down by shrapnel in [place]. He very seldom drinks alcohol. MMSE 27/30 in October 2018.  PAST MEDICAL  HISTORY: Past Medical History:  Diagnosis Date   Agent orange exposure    in [place]   Anemia    Heart block AV second degree    a. s/p PPM implant with subsequent CRTD upgrade   High cholesterol    History of colon polyps    Hypertension    LBBB (left bundle branch block)    Nonischemic cardiomyopathy ([place])    a. MDT CRTD upgrade 2016   Pacemaker 08/27/2013   Dual-chamber Medtronic Adapta implanted January 2013 for bradycardia with alternating bundle branch block and 2:1 atrioventricular block    Prostate cancer ([place])    "not been treated yet; on a wait and see" (01/22/2015)   Type II diabetes mellitus ([place])     MEDICATIONS: Current Outpatient Medications on File Prior to Visit  Medication Sig Dispense Refill   ACCU-CHEK COMPACT PLUS test strip use 1 TEST STRIP once daily as directed 102 each 5   atorvastatin (LIPITOR) 80  MG tablet Take 40 mg by mouth daily at 6 PM.     buPROPion (WELLBUTRIN XL) 150 MG 24 hr tablet Take 2 tablets in AM, 1 tablet in PM 270 tablet 3   CHELATED MAGNESIUM PO Take 250 mg by mouth daily.     clobetasol ointment (TEMOVATE) 3.15 % Apply 1 application topically daily as needed (itching).      clopidogrel (PLAVIX) 75 MG tablet TAKE 1 TABLET(75 MG) BY MOUTH DAILY 90 tablet 3   Continuous Blood Gluc Receiver (FREESTYLE LIBRE 14 DAY READER) DEVI 1 each by Does not apply route See admin instructions. Use Freestyle Libre Reader to monitor blood sugars. 1 each 0   Continuous Blood Gluc Sensor (FREESTYLE LIBRE 14 DAY SENSOR) MISC CHANGE EVERY 14 DAYS AS DIRECTED OR NEEDED 6 each 2   donepezil (ARICEPT) 5 MG tablet Take 1 tablet (5 mg total) by mouth daily. 90 tablet 3   Empagliflozin-metFORMIN HCl ER (SYNJARDY XR) 03-999 MG TB24 Take 2 tablets by mouth daily. 60 tablet 2   LORazepam (ATIVAN) 0.5 MG tablet Take 0.5 mg by mouth every 8 (eight) hours as needed for anxiety.      memantine (NAMENDA) 10 MG tablet TAKE 1 TABLET BY MOUTH EVERY NIGHT FOR 2 WEEKS. INCREASE  TO 1 TABLET 2 TIMES A DAY 180 tablet 1   promethazine (PHENERGAN) 12.5 MG tablet Take 1 tablet (12.5 mg total) by mouth every 6 (six) hours as needed for nausea or vomiting. 30 tablet 0   VIAGRA 100 MG tablet TAKE ONE TABLET BY MOUTH AS NEEDED FOR ERECTILE DYSFUNCTION. (Patient taking differently: Take 50 mg by mouth as needed for erectile dysfunction.) 10 tablet 3   No current facility-administered medications on file prior to visit.    ALLERGIES: Allergies  Allergen Reactions   Sulfa Antibiotics Swelling    Swelling of lips only.   5-Alpha Reductase Inhibitors     FAMILY HISTORY: Family History  Problem Relation Age of Onset   Ovarian cancer Mother    Cancer Mother        ?uterine   Leukemia Father    Heart disease Father    Diabetes Neg Hx     SOCIAL HISTORY: Social History   Socioeconomic History   Marital status: Married    Spouse name: Not on file   Number of children: 0   Years of education: Not on file   Highest education level: Not on file  Occupational History   Occupation: Runner, broadcasting/film/video    Employer: [employer]   Occupation: Retired  Tobacco Use   Smoking status: Former    Packs/day: 1.00    Years: 20.00    Pack years: 20.00    Types: Cigarettes    Quit date: 04/13/1984    Years since quitting: 37.4   Smokeless tobacco: Never  Vaping Use   Vaping Use: Never used  Substance and Sexual Activity   Alcohol use: Yes    Comment: 01/22/2015 "maybe 3 beers or glasses of wine /month"   Drug use: No   Sexual activity: Yes    Partners: Female  Other Topics Concern   Not on file  Social History Narrative   Pt lives in [place] with spouse in 2 story home. He has three step children. 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 [place] Veteran. 14 years of education. Volunteer at [employer] 3 days a week. Right handed     Social Determinants of Health   Financial  Resource Strain: Low Risk    Difficulty of Paying Living Expenses:  Not hard at all  Food Insecurity: Not on file  Transportation Needs: No Transportation Needs   Lack of Transportation (Medical): No   Lack of Transportation (Non-Medical): No  Physical Activity: Not on file  Stress: Not on file  Social Connections: Not on file  Intimate Partner Violence: Not on file     PHYSICAL EXAM: Vitals:   09/10/21 0949  BP: 120/70  Pulse: 71  SpO2: 98%   General: No acute distress Head:  Normocephalic/atraumatic Skin/Extremities: No rash, no edema Neurological Exam: alert and oriented to person, place, year, season. No aphasia or dysarthria. Fund of knowledge is reduced.  Recent and remote memory are impaired.  Attention and concentration are reduced.  MMSE 22/30.  MMSE - Mini Mental State Exam 09/10/2021 12/26/2020 09/21/2016  Orientation to time 2 2 5   Orientation to Place 5 5 5   Registration 3 3 3   Attention/ Calculation 3 3 5   Recall 0 1 2  Language- name 2 objects 2 2 2   Language- repeat 1 1 1   Language- follow 3 step command 3 3 3   Language- read & follow direction 1 1 1   Write a sentence 1 1 1   Copy design 1 0 0  Total score 22 22 28    Cranial nerves: Pupils equal, round. Extraocular movements intact with no nystagmus. Visual fields full.  No facial asymmetry.  Motor: Bulk and tone normal, no cogwheeling, muscle strength 5/5 throughout with no pronator drift.   Finger to nose testing intact.  Gait slightly wide-based, no ataxia. No tremor   IMPRESSION: This is a pleasant 75 yo RH man with a history of  hypertension, hyperlipidemia, diabetes, prostrate cancer, NICM, second degree AV block s/p PPM, with mild dementia, likely vascular. He is on Donepezil 5mg  daily (side effects on higher dose) and Memantine 10mg  BID, refills sent. He continues to have mood changes, continue Bupropion. Continue Plavix and control of vascular risk factors for secondary stroke prevention, intracranial atherosclerosis. We discussed driving concerns. He will be scheduled  for Neuropsychological evaluation to further evaluate cognitive concerns, we had previously discussed driving evaluation which he declined. He continues to have balance issues and falls, advised to proceed with Balance therapy. We again discussed the importance of control of vascular risk factors, physical exercise, and brain stimulation exercises for brain health. Follow-up after Neuropsych testing, they know to call for any changes.   Thank you for allowing me to participate in his care.  Please do not hesitate to call for any questions or concerns.    Ellouise Newer, M.D.   CC: Dr. Elease Hashimoto

## 2021-09-10 NOTE — Addendum Note (Signed)
Addended by: Jake Seats on: 09/10/2021 11:24 AM   Modules accepted: Orders

## 2021-09-21 ENCOUNTER — Ambulatory Visit: Payer: Medicare HMO | Attending: Neurology | Admitting: Physical Therapy

## 2021-09-23 ENCOUNTER — Telehealth: Payer: Self-pay | Admitting: Family Medicine

## 2021-09-23 NOTE — Telephone Encounter (Signed)
Left message for patient to call back and schedule Medicare Annual Wellness Visit (AWV) either virtually or in office. Left  my Herbie Drape number [phone]   Last AWV 06/04/20  please schedule at anytime with [facility] Nurse Health Advisor 1 or 2   This should be a 45 minute visit.

## 2021-10-05 ENCOUNTER — Telehealth: Payer: Self-pay

## 2021-10-05 ENCOUNTER — Ambulatory Visit (INDEPENDENT_AMBULATORY_CARE_PROVIDER_SITE_OTHER): Payer: Medicare HMO

## 2021-10-05 DIAGNOSIS — Z95810 Presence of automatic (implantable) cardiac defibrillator: Secondary | ICD-10-CM | POA: Diagnosis not present

## 2021-10-05 DIAGNOSIS — I5042 Chronic combined systolic (congestive) and diastolic (congestive) heart failure: Secondary | ICD-10-CM | POA: Diagnosis not present

## 2021-10-05 NOTE — Telephone Encounter (Signed)
Remote ICM transmission received.  Attempted call to patient on primary number and phone number not in service regarding ICM remote transmission.   Attempted to call wife phone number per DPR and left message for return call.

## 2021-10-05 NOTE — Progress Notes (Signed)
EPIC Encounter for ICM Monitoring  Patient Name: Kevin [name] is a 75 y.o. male Date: 10/05/2021 Primary Care Physican: [name], MD Primary Cardiologist: [name] Electrophysiologist: [name] Bi-V Pacing:  100% 06/17/2021 Office Weight: 156 lbs     Attempted call to patient/wife and unable to reach.  Transmission reviewed.    Optivol Thoracic impedance suggesting possible fluid accumulation starting 11/17//2022.     Labs: 03/13/2021 Creatinine 1.22, BUN 11, Potassium 4.8, Sodium 142, GFR 58.08 A complete set of results can be found in Results Review.   Patient does not take a diuretic.   Recommendations:  Unable to reach.     Follow-up plan: ICM clinic phone appointment on 10/12/2021 to recheck fluid levels.   91 day device clinic remote transmission 12/01/2021.       EP/Cardiology Office Visits:  Recall 12/17/2021 with Oda Kilts, PA.   Copy of ICM check sent to Dr. Rayann Heman.    3 month ICM trend: 10/05/2021.    12-14 Month ICM trend:       Rosalene Billings, RN 10/05/2021 4:31 PM

## 2021-10-12 ENCOUNTER — Ambulatory Visit (INDEPENDENT_AMBULATORY_CARE_PROVIDER_SITE_OTHER): Payer: Medicare HMO

## 2021-10-12 DIAGNOSIS — I5042 Chronic combined systolic (congestive) and diastolic (congestive) heart failure: Secondary | ICD-10-CM

## 2021-10-12 DIAGNOSIS — Z95810 Presence of automatic (implantable) cardiac defibrillator: Secondary | ICD-10-CM

## 2021-10-14 ENCOUNTER — Telehealth: Payer: Self-pay | Admitting: Family Medicine

## 2021-10-14 NOTE — Telephone Encounter (Signed)
Pt called in to update provider with his flu vaccine information. Pt would like to have his chart updated.   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

## 2021-10-14 NOTE — Progress Notes (Signed)
EPIC Encounter for ICM Monitoring  Patient Name: Kevin [name] is a 75 y.o. male Date: 10/14/2021 Primary Care Physican: [name], MD Primary Cardiologist: [name] Electrophysiologist: [name] Bi-V Pacing:  100% 06/17/2021 Office Weight: 156 lbs     Transmission reviewed.    Optivol Thoracic impedance suggesting fluid levels returned close to normal.     Labs: 03/13/2021 Creatinine 1.22, BUN 11, Potassium 4.8, Sodium 142, GFR 58.08 A complete set of results can be found in Results Review.   Patient does not take a diuretic.   Recommendations:  No changes.    Follow-up plan: ICM clinic phone appointment on 11/16/2021.   91 day device clinic remote transmission 12/01/2021.       EP/Cardiology Office Visits:  Recall 12/17/2021 with Oda Kilts, PA.   Copy of ICM check sent to Dr. Rayann Heman.   3 month ICM trend: 10/12/2021.    12-14 Month ICM trend:       Rosalene Billings, RN 10/14/2021 8:23 AM

## 2021-10-20 ENCOUNTER — Emergency Department (HOSPITAL_COMMUNITY): Payer: No Typology Code available for payment source

## 2021-10-20 ENCOUNTER — Other Ambulatory Visit: Payer: Self-pay

## 2021-10-20 ENCOUNTER — Inpatient Hospital Stay (HOSPITAL_COMMUNITY)
Admission: EM | Admit: 2021-10-20 | Discharge: 2021-10-24 | DRG: 271 | Disposition: A | Discharge status: Rehabilitation Facility | Payer: No Typology Code available for payment source | Attending: Internal Medicine | Admitting: Internal Medicine

## 2021-10-20 ENCOUNTER — Observation Stay (HOSPITAL_COMMUNITY): Payer: No Typology Code available for payment source

## 2021-10-20 DIAGNOSIS — S32599A Other specified fracture of unspecified pubis, initial encounter for closed fracture: Secondary | ICD-10-CM | POA: Diagnosis present

## 2021-10-20 DIAGNOSIS — Z8673 Personal history of transient ischemic attack (TIA), and cerebral infarction without residual deficits: Secondary | ICD-10-CM

## 2021-10-20 DIAGNOSIS — E119 Type 2 diabetes mellitus without complications: Secondary | ICD-10-CM

## 2021-10-20 DIAGNOSIS — I5042 Chronic combined systolic (congestive) and diastolic (congestive) heart failure: Secondary | ICD-10-CM | POA: Diagnosis present

## 2021-10-20 DIAGNOSIS — T68XXXA Hypothermia, initial encounter: Secondary | ICD-10-CM | POA: Diagnosis not present

## 2021-10-20 DIAGNOSIS — D62 Acute posthemorrhagic anemia: Secondary | ICD-10-CM | POA: Diagnosis present

## 2021-10-20 DIAGNOSIS — W19XXXA Unspecified fall, initial encounter: Secondary | ICD-10-CM

## 2021-10-20 DIAGNOSIS — R58 Hemorrhage, not elsewhere classified: Principal | ICD-10-CM | POA: Diagnosis present

## 2021-10-20 DIAGNOSIS — F02A3 Dementia in other diseases classified elsewhere, mild, with mood disturbance: Secondary | ICD-10-CM | POA: Diagnosis present

## 2021-10-20 DIAGNOSIS — I428 Other cardiomyopathies: Secondary | ICD-10-CM | POA: Diagnosis present

## 2021-10-20 DIAGNOSIS — E1165 Type 2 diabetes mellitus with hyperglycemia: Secondary | ICD-10-CM | POA: Diagnosis present

## 2021-10-20 DIAGNOSIS — M160 Bilateral primary osteoarthritis of hip: Secondary | ICD-10-CM | POA: Diagnosis present

## 2021-10-20 DIAGNOSIS — R319 Hematuria, unspecified: Secondary | ICD-10-CM

## 2021-10-20 DIAGNOSIS — G309 Alzheimer's disease, unspecified: Secondary | ICD-10-CM | POA: Diagnosis present

## 2021-10-20 DIAGNOSIS — I10 Essential (primary) hypertension: Secondary | ICD-10-CM | POA: Diagnosis present

## 2021-10-20 DIAGNOSIS — E785 Hyperlipidemia, unspecified: Secondary | ICD-10-CM | POA: Diagnosis present

## 2021-10-20 DIAGNOSIS — T148XXA Other injury of unspecified body region, initial encounter: Secondary | ICD-10-CM

## 2021-10-20 DIAGNOSIS — D72829 Elevated white blood cell count, unspecified: Secondary | ICD-10-CM | POA: Diagnosis present

## 2021-10-20 DIAGNOSIS — Z85828 Personal history of other malignant neoplasm of skin: Secondary | ICD-10-CM

## 2021-10-20 DIAGNOSIS — R339 Retention of urine, unspecified: Secondary | ICD-10-CM | POA: Diagnosis present

## 2021-10-20 DIAGNOSIS — S32591A Other specified fracture of right pubis, initial encounter for closed fracture: Secondary | ICD-10-CM | POA: Diagnosis not present

## 2021-10-20 DIAGNOSIS — I130 Hypertensive heart and chronic kidney disease with heart failure and stage 1 through stage 4 chronic kidney disease, or unspecified chronic kidney disease: Secondary | ICD-10-CM | POA: Diagnosis present

## 2021-10-20 DIAGNOSIS — Z882 Allergy status to sulfonamides status: Secondary | ICD-10-CM

## 2021-10-20 DIAGNOSIS — Z20822 Contact with and (suspected) exposure to covid-19: Secondary | ICD-10-CM | POA: Diagnosis present

## 2021-10-20 DIAGNOSIS — I495 Sick sinus syndrome: Secondary | ICD-10-CM | POA: Diagnosis present

## 2021-10-20 DIAGNOSIS — Z79899 Other long term (current) drug therapy: Secondary | ICD-10-CM

## 2021-10-20 DIAGNOSIS — Z8546 Personal history of malignant neoplasm of prostate: Secondary | ICD-10-CM

## 2021-10-20 DIAGNOSIS — I447 Left bundle-branch block, unspecified: Secondary | ICD-10-CM | POA: Diagnosis present

## 2021-10-20 DIAGNOSIS — E7800 Pure hypercholesterolemia, unspecified: Secondary | ICD-10-CM | POA: Diagnosis present

## 2021-10-20 DIAGNOSIS — N1832 Chronic kidney disease, stage 3b: Secondary | ICD-10-CM | POA: Diagnosis present

## 2021-10-20 DIAGNOSIS — Z87891 Personal history of nicotine dependence: Secondary | ICD-10-CM

## 2021-10-20 DIAGNOSIS — Z66 Do not resuscitate: Secondary | ICD-10-CM | POA: Diagnosis present

## 2021-10-20 DIAGNOSIS — Z7984 Long term (current) use of oral hypoglycemic drugs: Secondary | ICD-10-CM

## 2021-10-20 DIAGNOSIS — S3792XA Contusion of unspecified urinary and pelvic organ, initial encounter: Secondary | ICD-10-CM | POA: Diagnosis present

## 2021-10-20 DIAGNOSIS — Z7902 Long term (current) use of antithrombotics/antiplatelets: Secondary | ICD-10-CM

## 2021-10-20 DIAGNOSIS — Z95810 Presence of automatic (implantable) cardiac defibrillator: Secondary | ICD-10-CM

## 2021-10-20 DIAGNOSIS — E1169 Type 2 diabetes mellitus with other specified complication: Secondary | ICD-10-CM

## 2021-10-20 DIAGNOSIS — Z743 Need for continuous supervision: Secondary | ICD-10-CM | POA: Diagnosis not present

## 2021-10-20 DIAGNOSIS — Z888 Allergy status to other drugs, medicaments and biological substances status: Secondary | ICD-10-CM

## 2021-10-20 DIAGNOSIS — F01A3 Vascular dementia, mild, with mood disturbance: Secondary | ICD-10-CM | POA: Diagnosis present

## 2021-10-20 DIAGNOSIS — N501 Vascular disorders of male genital organs: Secondary | ICD-10-CM

## 2021-10-20 DIAGNOSIS — E1122 Type 2 diabetes mellitus with diabetic chronic kidney disease: Secondary | ICD-10-CM | POA: Diagnosis present

## 2021-10-20 DIAGNOSIS — W1830XA Fall on same level, unspecified, initial encounter: Secondary | ICD-10-CM | POA: Diagnosis present

## 2021-10-20 DIAGNOSIS — R739 Hyperglycemia, unspecified: Secondary | ICD-10-CM | POA: Diagnosis not present

## 2021-10-20 HISTORY — PX: IR ANGIOGRAM VISCERAL SELECTIVE: IMG657

## 2021-10-20 HISTORY — PX: IR ANGIOGRAM PELVIS SELECTIVE OR SUPRASELECTIVE: IMG661

## 2021-10-20 HISTORY — PX: IR EMBO ART  VEN HEMORR LYMPH EXTRAV  INC GUIDE ROADMAPPING: IMG5450

## 2021-10-20 HISTORY — PX: IR US GUIDE VASC ACCESS LEFT: IMG2389

## 2021-10-20 LAB — COMPREHENSIVE METABOLIC PANEL
ALT: 11 U/L (ref 0–44)
AST: 19 U/L (ref 15–41)
Albumin: 2.9 g/dL — ABNORMAL LOW (ref 3.5–5.0)
Alkaline Phosphatase: 66 U/L (ref 38–126)
Anion gap: 6 (ref 5–15)
BUN: 14 mg/dL (ref 8–23)
CO2: 25 mmol/L (ref 22–32)
Calcium: 8.4 mg/dL — ABNORMAL LOW (ref 8.9–10.3)
Chloride: 107 mmol/L (ref 98–111)
Creatinine, Ser: 1.28 mg/dL — ABNORMAL HIGH (ref 0.61–1.24)
GFR, Estimated: 58 mL/min — ABNORMAL LOW (ref >60)
Glucose, Bld: 96 mg/dL (ref 70–99)
Potassium: 4.4 mmol/L (ref 3.5–5.1)
Sodium: 138 mmol/L (ref 135–145)
Total Bilirubin: 1 mg/dL (ref 0.3–1.2)
Total Protein: 5.2 g/dL — ABNORMAL LOW (ref 6.5–8.1)

## 2021-10-20 LAB — APTT: aPTT: 25 seconds (ref 24–36)

## 2021-10-20 LAB — URINALYSIS, ROUTINE W REFLEX MICROSCOPIC
Bilirubin Urine: NEGATIVE
Glucose, UA: 250 mg/dL — AB
Ketones, ur: NEGATIVE mg/dL
Leukocytes,Ua: NEGATIVE
Nitrite: NEGATIVE
Protein, ur: NEGATIVE mg/dL
Specific Gravity, Urine: 1.02 (ref 1.005–1.030)
pH: 6 (ref 5.0–8.0)

## 2021-10-20 LAB — CBC WITH DIFFERENTIAL/PLATELET
Abs Immature Granulocytes: 0.18 10*3/uL — ABNORMAL HIGH (ref 0.00–0.07)
Basophils Absolute: 0 10*3/uL (ref 0.0–0.1)
Basophils Relative: 0 %
Eosinophils Absolute: 0.1 10*3/uL (ref 0.0–0.5)
Eosinophils Relative: 1 %
HCT: 43.2 % (ref 39.0–52.0)
Hemoglobin: 14.2 g/dL (ref 13.0–17.0)
Immature Granulocytes: 1 %
Lymphocytes Relative: 8 %
Lymphs Abs: 1.3 10*3/uL (ref 0.7–4.0)
MCH: 33.7 pg (ref 26.0–34.0)
MCHC: 32.9 g/dL (ref 30.0–36.0)
MCV: 102.6 fL — ABNORMAL HIGH (ref 80.0–100.0)
Monocytes Absolute: 0.8 10*3/uL (ref 0.1–1.0)
Monocytes Relative: 5 %
Neutro Abs: 14.4 10*3/uL — ABNORMAL HIGH (ref 1.7–7.7)
Neutrophils Relative %: 85 %
Platelets: 135 10*3/uL — ABNORMAL LOW (ref 150–400)
RBC: 4.21 MIL/uL — ABNORMAL LOW (ref 4.22–5.81)
RDW: 12.4 % (ref 11.5–15.5)
WBC: 16.8 10*3/uL — ABNORMAL HIGH (ref 4.0–10.5)
nRBC: 0 % (ref 0.0–0.2)

## 2021-10-20 LAB — URINALYSIS, MICROSCOPIC (REFLEX)
RBC / HPF: >50 RBC/hpf (ref 0–5)
Squamous Epithelial / HPF: NONE SEEN (ref 0–5)

## 2021-10-20 LAB — PROTIME-INR
INR: 1.1 (ref 0.8–1.2)
Prothrombin Time: 14.2 seconds (ref 11.4–15.2)

## 2021-10-20 LAB — TYPE AND SCREEN
ABO/RH(D): A POS
Antibody Screen: NEGATIVE

## 2021-10-20 LAB — BASIC METABOLIC PANEL
Anion gap: 10 (ref 5–15)
BUN: 15 mg/dL (ref 8–23)
CO2: 21 mmol/L — ABNORMAL LOW (ref 22–32)
Calcium: 8.1 mg/dL — ABNORMAL LOW (ref 8.9–10.3)
Chloride: 107 mmol/L (ref 98–111)
Creatinine, Ser: 1.32 mg/dL — ABNORMAL HIGH (ref 0.61–1.24)
GFR, Estimated: 56 mL/min — ABNORMAL LOW (ref >60)
Glucose, Bld: 209 mg/dL — ABNORMAL HIGH (ref 70–99)
Potassium: 3.9 mmol/L (ref 3.5–5.1)
Sodium: 138 mmol/L (ref 135–145)

## 2021-10-20 LAB — CBC
HCT: 35.1 % — ABNORMAL LOW (ref 39.0–52.0)
Hemoglobin: 11.4 g/dL — ABNORMAL LOW (ref 13.0–17.0)
MCH: 33.3 pg (ref 26.0–34.0)
MCHC: 32.5 g/dL (ref 30.0–36.0)
MCV: 102.6 fL — ABNORMAL HIGH (ref 80.0–100.0)
Platelets: 104 10*3/uL — ABNORMAL LOW (ref 150–400)
RBC: 3.42 MIL/uL — ABNORMAL LOW (ref 4.22–5.81)
RDW: 12.5 % (ref 11.5–15.5)
WBC: 7.9 10*3/uL (ref 4.0–10.5)
nRBC: 0 % (ref 0.0–0.2)

## 2021-10-20 LAB — CK: Total CK: 134 U/L (ref 49–397)

## 2021-10-20 LAB — LACTIC ACID, PLASMA: Lactic Acid, Venous: 1.6 mmol/L (ref 0.5–1.9)

## 2021-10-20 LAB — ABO/RH: ABO/RH(D): A POS

## 2021-10-20 LAB — GLUCOSE, CAPILLARY
Glucose-Capillary: 87 mg/dL (ref 70–99)
Glucose-Capillary: 172 mg/dL — ABNORMAL HIGH (ref 70–99)

## 2021-10-20 MED ORDER — MIDAZOLAM HCL 2 MG/2ML IJ SOLN
INTRAMUSCULAR | Status: AC | PRN
  Administered 2021-10-20 (×2): 1 mg via INTRAVENOUS

## 2021-10-20 MED ORDER — BUPROPION HCL ER (XL) 150 MG PO TB24
150.0000 mg | ORAL_TABLET | Freq: Every day | ORAL | Status: DC
  Administered 2021-10-20 – 2021-10-23 (×4): 150 mg via ORAL
  Filled 2021-10-20 (×4): qty 1

## 2021-10-20 MED ORDER — DONEPEZIL HCL 5 MG PO TABS
5.0000 mg | ORAL_TABLET | Freq: Every day | ORAL | Status: DC
  Administered 2021-10-21 – 2021-10-24 (×4): 5 mg via ORAL
  Filled 2021-10-20 (×6): qty 1

## 2021-10-20 MED ORDER — FENTANYL CITRATE (PF) 100 MCG/2ML IJ SOLN
INTRAMUSCULAR | Status: AC
  Filled 2021-10-20: qty 2

## 2021-10-20 MED ORDER — HYDROCODONE-ACETAMINOPHEN 5-325 MG PO TABS
1.0000 | ORAL_TABLET | ORAL | Status: DC | PRN
  Administered 2021-10-20: 1 via ORAL
  Administered 2021-10-21 – 2021-10-23 (×4): 2 via ORAL
  Filled 2021-10-20: qty 2
  Filled 2021-10-20: qty 1
  Filled 2021-10-20 (×3): qty 2

## 2021-10-20 MED ORDER — BUPROPION HCL ER (XL) 300 MG PO TB24
300.0000 mg | ORAL_TABLET | Freq: Every day | ORAL | Status: DC
  Administered 2021-10-21 – 2021-10-24 (×4): 300 mg via ORAL
  Filled 2021-10-20 (×4): qty 1

## 2021-10-20 MED ORDER — SODIUM CHLORIDE 0.9% FLUSH: 3.0000 mL | INTRAVENOUS | Status: DC | PRN

## 2021-10-20 MED ORDER — SODIUM CHLORIDE 0.9% FLUSH
3.0000 mL | Freq: Two times a day (BID) | INTRAVENOUS | Status: DC
  Administered 2021-10-20 – 2021-10-24 (×7): 3 mL via INTRAVENOUS

## 2021-10-20 MED ORDER — GELATIN ABSORBABLE 12-7 MM EX MISC
CUTANEOUS | Status: AC
  Filled 2021-10-20: qty 1

## 2021-10-20 MED ORDER — LIDOCAINE HCL 1 % IJ SOLN
INTRAMUSCULAR | Status: AC
  Administered 2021-10-20: 5 mL via SUBCUTANEOUS
  Filled 2021-10-20: qty 20

## 2021-10-20 MED ORDER — ONDANSETRON HCL 4 MG/2ML IJ SOLN
4.0000 mg | Freq: Four times a day (QID) | INTRAMUSCULAR | Status: DC | PRN
  Administered 2021-10-23 – 2021-10-24 (×2): 4 mg via INTRAVENOUS
  Filled 2021-10-20 (×2): qty 2

## 2021-10-20 MED ORDER — MIDAZOLAM HCL 2 MG/2ML IJ SOLN
INTRAMUSCULAR | Status: AC
  Filled 2021-10-20: qty 2

## 2021-10-20 MED ORDER — LORAZEPAM 0.5 MG PO TABS
0.5000 mg | ORAL_TABLET | Freq: Three times a day (TID) | ORAL | Status: DC | PRN
  Administered 2021-10-22 – 2021-10-24 (×3): 0.5 mg via ORAL
  Filled 2021-10-20 (×3): qty 1

## 2021-10-20 MED ORDER — INSULIN ASPART 100 UNIT/ML IJ SOLN
0.0000 [IU] | Freq: Three times a day (TID) | INTRAMUSCULAR | Status: DC
  Administered 2021-10-21 – 2021-10-22 (×3): 2 [IU] via SUBCUTANEOUS
  Administered 2021-10-22: 1 [IU] via SUBCUTANEOUS
  Administered 2021-10-22: 2 [IU] via SUBCUTANEOUS
  Administered 2021-10-23: 1 [IU] via SUBCUTANEOUS
  Administered 2021-10-23: 2 [IU] via SUBCUTANEOUS
  Administered 2021-10-23 – 2021-10-24 (×2): 1 [IU] via SUBCUTANEOUS
  Administered 2021-10-24: 2 [IU] via SUBCUTANEOUS

## 2021-10-20 MED ORDER — HEPARIN SODIUM (PORCINE) 1000 UNIT/ML IJ SOLN
INTRAMUSCULAR | Status: AC
  Filled 2021-10-20: qty 10

## 2021-10-20 MED ORDER — LACTATED RINGERS IV BOLUS (SEPSIS)
1000.0000 mL | Freq: Once | INTRAVENOUS | Status: AC
  Administered 2021-10-20: 1000 mL via INTRAVENOUS

## 2021-10-20 MED ORDER — IOHEXOL 350 MG/ML SOLN
100.0000 mL | Freq: Once | INTRAVENOUS | Status: AC | PRN
  Administered 2021-10-20: 100 mL via INTRAVENOUS

## 2021-10-20 MED ORDER — SODIUM CHLORIDE 0.9 % IV BOLUS
500.0000 mL | Freq: Once | INTRAVENOUS | Status: AC
  Administered 2021-10-20: 500 mL via INTRAVENOUS

## 2021-10-20 MED ORDER — MEMANTINE HCL 10 MG PO TABS
10.0000 mg | ORAL_TABLET | Freq: Two times a day (BID) | ORAL | Status: DC
  Administered 2021-10-20 – 2021-10-24 (×8): 10 mg via ORAL
  Filled 2021-10-20 (×9): qty 1

## 2021-10-20 MED ORDER — LACTATED RINGERS IV BOLUS
500.0000 mL | Freq: Once | INTRAVENOUS | Status: AC
  Administered 2021-10-20: 500 mL via INTRAVENOUS

## 2021-10-20 MED ORDER — ACETAMINOPHEN 325 MG PO TABS
650.0000 mg | ORAL_TABLET | Freq: Four times a day (QID) | ORAL | Status: DC | PRN
  Administered 2021-10-22 – 2021-10-23 (×4): 650 mg via ORAL
  Filled 2021-10-20 (×4): qty 2

## 2021-10-20 MED ORDER — IOHEXOL 300 MG/ML  SOLN: 100.0000 mL | Freq: Once | INTRAMUSCULAR | Status: DC | PRN

## 2021-10-20 MED ORDER — BUPROPION HCL ER (XL) 150 MG PO TB24: 150.0000 mg | ORAL_TABLET | ORAL | Status: DC

## 2021-10-20 MED ORDER — ATORVASTATIN CALCIUM 40 MG PO TABS
40.0000 mg | ORAL_TABLET | Freq: Every evening | ORAL | Status: DC
  Administered 2021-10-20 – 2021-10-23 (×4): 40 mg via ORAL
  Filled 2021-10-20 (×4): qty 1

## 2021-10-20 MED ORDER — SODIUM CHLORIDE 0.9 % IV BOLUS: 1000.0000 mL | Freq: Once | INTRAVENOUS | Status: DC

## 2021-10-20 MED ORDER — FENTANYL CITRATE (PF) 100 MCG/2ML IJ SOLN
INTRAMUSCULAR | Status: AC | PRN
  Administered 2021-10-20 (×2): 25 ug via INTRAVENOUS
  Administered 2021-10-20: 50 ug via INTRAVENOUS

## 2021-10-20 MED ORDER — SODIUM CHLORIDE 0.9 % IV SOLN: 250.0000 mL | INTRAVENOUS | Status: DC | PRN

## 2021-10-20 MED ORDER — ONDANSETRON HCL 4 MG PO TABS: 4.0000 mg | ORAL_TABLET | Freq: Four times a day (QID) | ORAL | Status: DC | PRN

## 2021-10-20 MED ORDER — ACETAMINOPHEN 650 MG RE SUPP: 650.0000 mg | Freq: Four times a day (QID) | RECTAL | Status: DC | PRN

## 2021-10-20 NOTE — ED Provider Notes (Signed)
[facility] EMERGENCY DEPARTMENT Provider Note   CSN: [id number] Arrival date & time: 10/20/21  0348     History Chief Complaint  Patient presents with   Fall   Cold Exposure    Kevin [name] is a 75 y.o. male.  HPI     This is a 75 year old male with a history of heart block status post pacemaker, hypertension, cardiomyopathy, diabetes who presents after fall.  Per EMS, patient was found by neighbors on his front porch.  Patient reports that he went out earlier this morning to get the paper.  He felt dizzy and fell over.  He denies loss of consciousness.  He denies hitting his head.  Wife reports that he has had a recent recurrent issues with dizziness.  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  Patient has no significant complaints this time.  No headache, chest pain, shortness of breath.  Previously was well.  Not on any blood thinners.  Nursing to report that Patient complained of right hip pain when they moved him.  Patient denies pain when at rest.  Patient denies room spinning dizziness.  He reports persistent lightheadedness.  Past Medical History:  Diagnosis Date   Agent orange exposure    in [place]   Anemia    Heart block AV second degree    a. s/p PPM implant with subsequent CRTD upgrade   High cholesterol    History of colon polyps    Hypertension    LBBB (left bundle branch block)    Nonischemic cardiomyopathy ([place])    a. MDT CRTD upgrade 2016   Pacemaker 08/27/2013   Dual-chamber Medtronic Adapta implanted January 2013 for bradycardia with alternating bundle branch block and 2:1 atrioventricular block    Prostate cancer ([place])    "not been treated yet; on a wait and see" (01/22/2015)   Type II diabetes mellitus ([place])     Patient Active Problem List   Diagnosis Date Noted   Acute COVID-19 06/16/2021   Chronic combined systolic and  diastolic heart failure ([place]) 09/28/2017   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 in situ 09/28/2017   Hx of adenomatous colonic polyps 10/20/2016   Malignant neoplasm of prostate ([place]) 12/45/8099   Chronic systolic dysfunction of left ventricle 01/22/2015   Left bundle branch block 11/24/2014   Sick sinus syndrome ([place]) 11/24/2014   Nonischemic cardiomyopathy ([place]) 11/23/2013   Ventricular tachycardia 11/23/2013   DM2 (diabetes mellitus, type 2) ([place]) 08/27/2013   Hyperlipidemia 08/27/2013   Condyloma 05/14/2013   Penile abnormality 05/05/2013   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/01/2013   Penile mass 02/19/2013   Hypertension 10/16/2012   Rectal discharge 07/14/2012   Jaw pain 07/14/2012   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   Insomnia 04/16/2011   Anxiety 04/16/2011    Past Surgical History:  Procedure Laterality Date   BASAL CELL CARCINOMA EXCISION     "back, face, neck"   BI-VENTRICULAR IMPLANTABLE CARDIOVERTER DEFIBRILLATOR UPGRADE N/A 01/22/2015   MDT CRTD upgrade by Dr Rayann Heman   BIV ICD GENERATOR CHANGEOUT N/A 08/28/2020   Procedure: BIV ICD GENERATOR CHANGEOUT;  Surgeon: [name], MD;  Location: [place] CV LAB;  Service: Cardiovascular;  Laterality: N/A;   CARDIAC CATHETERIZATION  01/27/2012   normal coronaries, dilated LV c/w nonischemic cardiomyopathy   EXAMINATION UNDER ANESTHESIA  04/06/2012   Procedure: EXAM UNDER ANESTHESIA;  Surgeon: Marcello Moores  Nydia Bouton, MD;  Location: [place];  Service: General;  Laterality: N/A;   INCISION AND DRAINAGE PERIRECTAL ABSCESS  ~ 2010; 2015   INGUINAL HERNIA REPAIR Left 1980   LEFT HEART CATHETERIZATION WITH CORONARY ANGIOGRAM N/A 01/27/2012   Procedure: LEFT HEART CATHETERIZATION WITH CORONARY ANGIOGRAM;  Surgeon: Troy Sine, MD;  Location: [facility] CATH LAB;  Service: Cardiovascular;  Laterality: N/A;   NM MYOCAR PERF WALL MOTION  01/21/2012   mod-severe perfusion defect due to infarct/scar w/mild perinfarct ischemia in the  apical,basal inferoseptal,basal inferior,mid inferoseptal,midinferior and apical inferior regions   pacemaker battery     PERMANENT PACEMAKER INSERTION N/A 12/01/2011   Medtronic implanted by Dr Sallyanne Kuster   PROSTATE BIOPSY     PROSTATE BIOPSY     shrapnel surgery     "in [place]"   TONSILLECTOMY         Family History  Problem Relation Age of Onset   Ovarian cancer Mother    Cancer Mother        ?uterine   Leukemia Father    Heart disease Father    Diabetes Neg Hx     Social History   Tobacco Use   Smoking status: Former    Packs/day: 1.00    Years: 20.00    Pack years: 20.00    Types: Cigarettes    Quit date: 04/13/1984    Years since quitting: 37.5   Smokeless tobacco: Never  Vaping Use   Vaping Use: Never used  Substance Use Topics   Alcohol use: Yes    Comment: 01/22/2015 "maybe 3 beers or glasses of wine /month"   Drug use: No    Home Medications Prior to Admission medications   Medication Sig Start Date End Date Taking? Authorizing Provider  acetaminophen (TYLENOL) 500 MG tablet Take 500 mg by mouth every 6 (six) hours as needed for moderate pain or headache.   Yes [provider]  atorvastatin (LIPITOR) 80 MG tablet Take 40 mg by mouth every evening.   Yes [provider]  buPROPion (WELLBUTRIN XL) 150 MG 24 hr tablet Take 2 tablets in AM, 1 tablet in PM Patient taking differently: Take 150-300 mg by mouth See admin instructions. 300 mg in the morning  150 mg at bedtime 09/10/21  Yes Cameron Sprang, MD  Carboxymethylcellulose Sodium (EYE DROPS OP) Place 1 drop into both eyes 3 (three) times daily. Given by [facility] doctor to help astigmtism   Yes [provider]  cholecalciferol (VITAMIN D) 25 MCG (1000 UNIT) tablet Take 1,000 Units by mouth daily.   Yes [provider]  clobetasol ointment (TEMOVATE) 6.07 % Apply 1 application topically daily as needed (itching).  09/26/13  Yes [provider]  clopidogrel (PLAVIX) 75 MG  tablet TAKE 1 TABLET(75 MG) BY MOUTH DAILY Patient taking differently: Take 75 mg by mouth daily. 09/10/21  Yes Cameron Sprang, MD  donepezil (ARICEPT) 5 MG tablet Take 1 tablet (5 mg total) by mouth daily. 09/10/21  Yes Cameron Sprang, MD  Empagliflozin-metFORMIN HCl ER (SYNJARDY XR) 03-999 MG TB24 Take 2 tablets by mouth daily. Patient taking differently: Take 0.5 tablets by mouth 2 (two) times daily. 08/31/21  Yes Elayne Snare, MD  LORazepam (ATIVAN) 0.5 MG tablet Take 0.5 mg by mouth every 8 (eight) hours as needed for anxiety.    Yes [provider]  memantine (NAMENDA) 10 MG tablet Take 1 tablet twice a day Patient taking differently: Take 10 mg by mouth 2 (two)  times daily. 09/10/21  Yes Cameron Sprang, MD  promethazine (PHENERGAN) 12.5 MG tablet Take 1 tablet (12.5 mg total) by mouth every 6 (six) hours as needed for nausea or vomiting. 06/16/21  Yes Janith Lima, MD  VIAGRA 100 MG tablet TAKE ONE TABLET BY MOUTH AS NEEDED FOR ERECTILE DYSFUNCTION. Patient taking differently: Take 50 mg by mouth as needed for erectile dysfunction. 02/21/15  Yes Burchette, Alinda Sierras, MD  ACCU-CHEK COMPACT PLUS test strip use 1 TEST STRIP once daily as directed 07/05/16   Elayne Snare, MD  Continuous Blood Gluc Receiver (FREESTYLE LIBRE 14 DAY READER) [place] 1 each by Does not apply route See admin instructions. Use Freestyle Libre Reader to monitor blood sugars. 08/24/20   Elayne Snare, MD  Continuous Blood Gluc Sensor (FREESTYLE LIBRE 14 DAY SENSOR) MISC CHANGE EVERY 14 DAYS AS DIRECTED OR NEEDED 08/04/21   Elayne Snare, MD    Allergies    Sulfa antibiotics and 5-alpha reductase inhibitors  Review of Systems   Review of Systems  Constitutional:  Negative for fever.  Respiratory:  Negative for shortness of breath.   Cardiovascular:  Negative for chest pain.  Gastrointestinal:  Negative for abdominal pain, nausea and vomiting.  Musculoskeletal:        Hip pain  Neurological:  Positive for  light-headedness. Negative for headaches.  All other systems reviewed and are negative.  Physical Exam Updated Vital Signs BP (!) 97/53   Pulse 80   Temp (!) 97.4 F (36.3 C) (Rectal)   Resp 16   Ht 1.93 m (6\' 4" )   Wt 72.6 kg   SpO2 91%   BMI 19.48 kg/m   Physical Exam Vitals and nursing note reviewed.  Constitutional:      Appearance: He is well-developed.     Comments: Awake, alert, no acute distress, ABCs intact  HENT:     Head: Normocephalic and atraumatic.     Nose: Nose normal.     Mouth/Throat:     Mouth: Mucous membranes are moist.  Eyes:     Pupils: Pupils are equal, round, and reactive to light.  Neck:     Comments: C-collar in place Cardiovascular:     Rate and Rhythm: Normal rate and regular rhythm.     Heart sounds: Normal heart sounds.     Comments: Pacemaker palpable left upper chest Pulmonary:     Effort: Pulmonary effort is normal. No respiratory distress.     Breath sounds: Normal breath sounds. No wheezing.  Abdominal:     Palpations: Abdomen is soft.     Tenderness: There is no abdominal tenderness. There is no rebound.  Musculoskeletal:        General: Normal range of motion.     Cervical back: Neck supple.     Comments: Tenderness palpation right hip, no obvious deformities, neurovascular intact distal  Lymphadenopathy:     Cervical: No cervical adenopathy.  Skin:    General: Skin is warm and dry.  Neurological:     Mental Status: He is alert and oriented to person, place, and time.     Comments: 5 out of 5 strength in all 4 extremities, no dysmetria to finger-nose-finger, cranial nerves II through XII intact  Psychiatric:        Mood and Affect: Mood normal.    ED Results / Procedures / Treatments   Labs (all labs ordered are listed, but only abnormal results are displayed) Labs Reviewed  CBC WITH DIFFERENTIAL/PLATELET - Abnormal; Notable for  the following components:      Result Value   WBC 16.8 (*)    RBC 4.21 (*)    MCV 102.6  (*)    Platelets 135 (*)    Neutro Abs 14.4 (*)    Abs Immature Granulocytes 0.18 (*)    All other components within normal limits  BASIC METABOLIC PANEL - Abnormal; Notable for the following components:   CO2 21 (*)    Glucose, Bld 209 (*)    Creatinine, Ser 1.32 (*)    Calcium 8.1 (*)    GFR, Estimated 56 (*)    All other components within normal limits  URINALYSIS, ROUTINE W REFLEX MICROSCOPIC - Abnormal; Notable for the following components:   Glucose, UA 250 (*)    Hgb urine dipstick LARGE (*)    All other components within normal limits  URINALYSIS, MICROSCOPIC (REFLEX) - Abnormal; Notable for the following components:   Bacteria, UA RARE (*)    All other components within normal limits  CULTURE, BLOOD (ROUTINE X 2)  CULTURE, BLOOD (ROUTINE X 2)  CULTURE, BLOOD (ROUTINE X 2)  CULTURE, BLOOD (ROUTINE X 2)  URINE CULTURE  CK  LACTIC ACID, PLASMA  PROTIME-INR  APTT  LACTIC ACID, PLASMA  COMPREHENSIVE METABOLIC PANEL    EKG EKG Interpretation  Date/Time:  Tuesday October 20 2021 04:00:33 EST Ventricular Rate:  71 PR Interval:  160 QRS Duration: 177 QT Interval:  492 QTC Calculation: 535 R Axis:   -89 Text Interpretation: duplicate, discard Confirmed by Delora Fuel ([id number]) on 10/20/2021 5:54:21 AM  Radiology CT Head Wo Contrast  Result Date: 10/20/2021 CLINICAL DATA:  Head neck trauma.  Found on ground.  Hypothermia. EXAM: CT HEAD WITHOUT CONTRAST CT CERVICAL SPINE WITHOUT CONTRAST TECHNIQUE: Multidetector CT imaging of the head and cervical spine was performed following the standard protocol without intravenous contrast. Multiplanar CT image reconstructions of the cervical spine were also generated. COMPARISON:  02/11/2020 head CT FINDINGS: CT HEAD FINDINGS Brain: No evidence of acute infarction, hemorrhage, hydrocephalus, extra-axial collection or mass lesion/mass effect. Remote left occipital and left cerebellar infarcts. Generalized brain atrophy. Vascular:  Negative Skull: Normal. Negative for fracture or focal lesion. Sinuses/Orbits: No acute finding. CT CERVICAL SPINE FINDINGS Alignment: No traumatic malalignment Skull base and vertebrae: No acute fracture or aggressive bone lesion. Soft tissues and spinal canal: No prevertebral fluid or swelling. No visible canal hematoma. 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 Prominent adjacent disc and facet degeneration. Upper chest: Biapical pleural based scarring with calcification IMPRESSION: No evidence of acute intracranial or cervical spine injury. Electronically Signed   By: Jorje Guild M.D.   On: 10/20/2021 05:29   CT Cervical Spine Wo Contrast  Result Date: 10/20/2021 CLINICAL DATA:  Head neck trauma.  Found on ground.  Hypothermia. EXAM: CT HEAD WITHOUT CONTRAST CT CERVICAL SPINE WITHOUT CONTRAST TECHNIQUE: Multidetector CT imaging of the head and cervical spine was performed following the standard protocol without intravenous contrast. Multiplanar CT image reconstructions of the cervical spine were also generated. COMPARISON:  02/11/2020 head CT FINDINGS: CT HEAD FINDINGS Brain: No evidence of acute infarction, hemorrhage, hydrocephalus, extra-axial collection or mass lesion/mass effect. Remote left occipital and left cerebellar infarcts. Generalized brain atrophy. Vascular: Negative Skull: Normal. Negative for fracture or focal lesion. Sinuses/Orbits: No acute finding. CT CERVICAL SPINE FINDINGS Alignment: No traumatic malalignment Skull base and vertebrae: No acute fracture or aggressive bone lesion. Soft tissues and spinal canal: No prevertebral fluid or swelling. No visible canal hematoma. 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  Prominent adjacent disc and facet degeneration. Upper chest: Biapical pleural based scarring with calcification IMPRESSION: No evidence of acute intracranial or cervical spine injury. Electronically Signed   By: Jorje Guild M.D.   On: 10/20/2021 05:29   DG Chest Port 1 View  Result Date:  10/20/2021 CLINICAL DATA:  Sepsis evaluation. EXAM: PORTABLE CHEST 1 VIEW COMPARISON:  Portable chest 09/13/2016 FINDINGS: Cardiac size is normal. Left chest dual lead pacing system with AID wiring is again noted and unchanged. The lungs are mildly emphysematous but clear of infiltrates. There are overlying wires and artifacts. No pleural effusion is seen. Mild osteopenia. IMPRESSION: No active disease.  Stable COPD chest. Electronically Signed   By: Telford Nab M.D.   On: 10/20/2021 06:06   DG Hip Port Unilat W or Wo Pelvis 1 View Right  Result Date: 10/20/2021 CLINICAL DATA:  75 year old male with history of trauma from a fall. Right-sided hip pain. EXAM: DG HIP (WITH OR WITHOUT PELVIS) 1V PORT RIGHT COMPARISON:  No priors. FINDINGS: Three views of the bony pelvis in the right hip demonstrate no definite acute displaced fracture, subluxation or dislocation. There is joint space narrowing, subchondral sclerosis, subchondral cyst formation and osteophyte formation in the hip joints bilaterally, indicative of moderate to severe osteoarthritis. IMPRESSION: 1. No acute radiographic abnormality of the bony pelvis or the right hip. 2. 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 Electronically Signed   By: Vinnie Langton M.D.   On: 10/20/2021 06:06    Procedures Procedures   Medications Ordered in ED Medications  sodium chloride 0.9 % bolus 500 mL (0 mLs Intravenous Stopped 10/20/21 0526)  lactated ringers bolus 1,000 mL (0 mLs Intravenous Stopped 10/20/21 0621)  lactated ringers bolus 500 mL (0 mLs Intravenous Stopped 10/20/21 3710)    ED Course  I have reviewed the triage vital signs and the nursing notes.  Pertinent labs & imaging results that were available during my care of the patient were reviewed by me and considered in my medical decision making (see chart for details).  Clinical Course as of 10/20/21 0714  Tue Oct 20, 2021  0541 Inform by nursing that patient's blood  pressure downtrending.  On repeat evaluation he is awake, alert, oriented.  He has a palpable radial pulse.  Requested manual blood pressure.  I did add and broaden patient's work-up to include sepsis work-up.  He does have a leukocytosis on his initial labs.  His hypotension could be related to his hypothermia but it is difficult to know which is the cause and effect.  Order 1 L LR.  Patient with history of CHF. [CH]  0620 BP 106/58 with fluids. [CH]  0630 Blood pressures have stabilized.  Will observe patient for a bit longer.  There are no signs of infection.  Chest x-ray is clear and urinalysis without obvious source of infection.  Temperature has risen appropriately.  Patient is only complaining an hour of being hot under the bear hugger.  We will ambulate to ensure nothing else hurts.  He may have had some transient hypotension related to hypothermia.  Signed out to oncoming provider. [CH]    Clinical Course User Index [CH] Lulu Hirschmann, Barbette Hair, MD   MDM Rules/Calculators/A&P                           Patient presents after cold exposure.  He fell trying to get the paper this morning.  He is hypothermic upon arrival but  nontoxic and appears to be his baseline.  Does report some lightheadedness.  He has been ongoing.  Vital signs initially reassuring.  He did have some transient hypotension.  For this reason, additional sepsis work-up was initiated.  EKG does not show any evidence of acute arrhythmia.  He is in a paced rhythm.  He denies chest pain or shortness of breath.  He is nonfocal.  CT head neck obtained and showed no evidence of traumatic injury.  Right hip films are negative.  Lactate is normal.  No evidence of pneumonia or UTI on infectious work-up.  Patient did receive a total of 2 L of fluid with improvement of his blood pressure.  See clinical course above.  Final Clinical Impression(s) / ED Diagnoses Final diagnoses:  None    Rx / DC Orders ED Discharge Orders     None         Jazman Reuter, Barbette Hair, MD 10/20/21 [phone]

## 2021-10-20 NOTE — ED Provider Notes (Signed)
°  Provider Note MRN:  [id number]  Arrival date & time: 10/20/21    ED Course and Medical Decision Making  Assumed care from Dr Dina Rich at shift change.  See not from prior team for complete details, in brief: 75 year old male with history of pacemaker, cardiomyopathy, diabetes, presented ER secondary to fall this morning.  Prodrome of dizziness.  No LOC.  No head injury.  Does have history of tenderness and vertigo, does not like using his hearing aids.  Patient mildly hypothermic, hypotensive.  This has since resolved.    Pt unable to ambulate 2/2 right hip pain, light-headedness, will obtain CT hip and give additional 500cc fluid bolus.   CT with mildly displaced pubic ramus fx to the right, hematoma in hemipelvis. Will d/w ortho. Recommend WBAT and o/p f/u for pelvic fx.  Pt with large volume of likely blood to right hemipelvis, much more than would be expected with a rami fx. Will obtain CTA a/p to assess for ongoing bleeding.  Received callback from IR, concern for injury of corona mortis with active bleeding and will require urgent intervention.  Given this plan for admission, IR planning to take patient to IR suite this afternoon. D/w results with pt and spouse who are agreeable to plan, all questions answered to their satisfaction.   D/w Dr Rogers Blocker who accepts pt for admission.    .Critical Care Performed by: Jeanell Sparrow, DO Authorized by: Jeanell Sparrow, DO   Critical care provider statement:    Critical care time (minutes):  34   Critical care time was exclusive of:  Separately billable procedures and treating other patients   Critical care was necessary to treat or prevent imminent or life-threatening deterioration of the following conditions:  Circulatory failure   Critical care was time spent personally by me on the following activities:  Development of treatment plan with patient or surrogate, discussions with consultants, evaluation of patient's response to treatment,  examination of patient, ordering and review of laboratory studies, ordering and review of radiographic studies, ordering and performing treatments and interventions, pulse oximetry, re-evaluation of patient's condition and review of old charts   Care discussed with: admitting provider   Comments:     arterial injury, hypotension, d/w IR consultants, pt sent to OR  Final Clinical Impressions(s) / ED Diagnoses     ICD-10-CM   1. Fall, initial encounter  W19.XXXA     2. Closed fracture of ramus of right pubis, initial encounter ([place])  S32.591A     3. Vascular injury  [email or web address]       ED Discharge Orders     None       Discharge Instructions   None        Brylon, Brenning, DO 10/20/21 1406

## 2021-10-20 NOTE — Sedation Documentation (Signed)
Vital signs stable. 

## 2021-10-20 NOTE — ED Notes (Signed)
Patient transported to CT 

## 2021-10-20 NOTE — Sedation Documentation (Signed)
Patient is resting comfortably. Vitals stable, procedure started

## 2021-10-20 NOTE — Sedation Documentation (Signed)
Patient is resting comfortably. 

## 2021-10-20 NOTE — ED Notes (Signed)
Pt removed c-collar after being informed multiple times of risks

## 2021-10-20 NOTE — Sedation Documentation (Signed)
Patient is resting comfortably. No complaints. Pt is somewhat restless on procedure table. See MAR, reassured pt and provided emotional support. Pt appreciative.

## 2021-10-20 NOTE — Procedures (Signed)
Interventional Radiology Procedure Note  Procedure:  1) Pelvic angiogram 2) Coil embolization of right pectineal branch artery 3) Gelfoam embolization of replaced right obturator/corona mortis artery  Findings: Please refer to procedural dictation for full description.Active extravasation visualized from superior pectineal branch of right corona mortis.  Coil embolization performed.  Proximal right corona mortis gelfoam embolization.  Left CFA access with 6 Fr Angioseal closure.  Complications: None immediate  Estimated Blood Loss: < 5 mL  Recommendations: Strict 4 hour bedrest, 2 hours flat (until 18:30), followed by 2 hours head of bed up to 30 degrees (until 20:30). Foley catheter out after bedrest complete. Continue to monitor H/H, encourage resuscitation as needed. IR will continue to follow.    Ruthann Cancer, MD Pager: [phone]

## 2021-10-20 NOTE — Consult Note (Signed)
Chief Complaint: Patient was seen in consultation today for pelvic bleeding   Referring Physician(s): Wynona Dove, DO  Supervising Physician: Ruthann Cancer  Patient Status: [facility] - ED  History of Present Illness: Kevin [name] is a 75 y.o. male with a past medical history significant for prostate cancer, anemia, HLD, HTN, cardiomyopathy, LBBB s/p pacemaker placement and DM who presented to [facility] ED via EMS early this morning after being found down on his front porch. He reported that he felt dizzy and fell over but did not lose consciousness or hit his head. He reported right hip pain upon arrival to the Ed. His wife reported recent issues with intermittent dizziness and early Alzheimer's. Initial evaluation in the ED notable for hypothermia, hypotension and leukocytosis. X-ray of the right hip, CXR and CT head/c-spine unremarkable for acute concerns. CT right hip w/o contrast was ordered for further evaluation of right hip pain with ambulation and noted mildly displaced right superior and inferior pubis ramus fractures as well as a large hematoma within the right hemipelvis with adjacent fluid/stranding measuring 10.9 x 5.8 x 8.3 cm. CTA abd/pelvis was ordered for further evaluation of pelvic hematoma which noted two foci of active bleeding in the right hemipelvis with largest focus of bleeding along the right anterior aspect of the prostate - bleeding appears to be supplied by a corona mortis branch originating from the distal right external iliac artery. IR has been consulted for pelvic angiogram with possible embolization.  Patient seen in ED, wife at bedside - he reports feeling ok, he is hungry and tells me he last ate around 9 pm yesterday and has had a couple sips of water so far today. He reports hip pain that is better with medications, but denies abdominal or pelvic pain. He also is asking if he can be cathed again because he has been unable to urinate since previous catheterization  around 10:30 am. Patient and wife state understanding of procedure and are agreeable to proceed as planned.  Past Medical History:  Diagnosis Date   Agent orange exposure    in [place]   Anemia    Heart block AV second degree    a. s/p PPM implant with subsequent CRTD upgrade   High cholesterol    History of colon polyps    Hypertension    LBBB (left bundle branch block)    Nonischemic cardiomyopathy ([place])    a. MDT CRTD upgrade 2016   Pacemaker 08/27/2013   Dual-chamber Medtronic Adapta implanted January 2013 for bradycardia with alternating bundle branch block and 2:1 atrioventricular block    Prostate cancer ([place])    "not been treated yet; on a wait and see" (01/22/2015)   Type II diabetes mellitus ([place])     Past Surgical History:  Procedure Laterality Date   BASAL CELL CARCINOMA EXCISION     "back, face, neck"   BI-VENTRICULAR IMPLANTABLE CARDIOVERTER DEFIBRILLATOR UPGRADE N/A 01/22/2015   MDT CRTD upgrade by Dr Rayann Heman   BIV ICD GENERATOR CHANGEOUT N/A 08/28/2020   Procedure: BIV ICD GENERATOR CHANGEOUT;  Surgeon: [name], MD;  Location: [place] [facility];  Service: Cardiovascular;  Laterality: N/A;   CARDIAC CATHETERIZATION  01/27/2012   normal coronaries, dilated LV c/w nonischemic cardiomyopathy   EXAMINATION UNDER ANESTHESIA  04/06/2012   Procedure: EXAM UNDER ANESTHESIA;  Surgeon: Marcello Moores A. Cornett, MD;  Location: [place];  Service: General;  Laterality: N/A;   INCISION AND DRAINAGE PERIRECTAL ABSCESS  ~ 2010; 2015   INGUINAL  HERNIA REPAIR Left 1980   LEFT HEART CATHETERIZATION WITH CORONARY ANGIOGRAM N/A 01/27/2012   Procedure: LEFT HEART CATHETERIZATION WITH CORONARY ANGIOGRAM;  Surgeon: Troy Sine, MD;  Location: [facility] CATH LAB;  Service: Cardiovascular;  Laterality: N/A;   NM MYOCAR PERF WALL MOTION  01/21/2012   mod-severe perfusion defect due to infarct/scar w/mild perinfarct ischemia in the apical,basal inferoseptal,basal inferior,mid inferoseptal,midinferior  and apical inferior regions   pacemaker battery     PERMANENT PACEMAKER INSERTION N/A 12/01/2011   Medtronic implanted by Dr Sallyanne Kuster   PROSTATE BIOPSY     PROSTATE BIOPSY     shrapnel surgery     "in [place]"   TONSILLECTOMY      Allergies: Sulfa antibiotics and 5-alpha reductase inhibitors  Medications: Prior to Admission medications   Medication Sig Start Date End Date Taking? Authorizing Provider  acetaminophen (TYLENOL) 500 MG tablet Take 500 mg by mouth every 6 (six) hours as needed for moderate pain or headache.   Yes [provider]  atorvastatin (LIPITOR) 80 MG tablet Take 40 mg by mouth every evening.   Yes [provider]  buPROPion (WELLBUTRIN XL) 150 MG 24 hr tablet Take 2 tablets in AM, 1 tablet in PM Patient taking differently: Take 150-300 mg by mouth See admin instructions. 300 mg in the morning  150 mg at bedtime 09/10/21  Yes Cameron Sprang, MD  Carboxymethylcellulose Sodium (EYE DROPS OP) Place 1 drop into both eyes 3 (three) times daily. Given by [facility] doctor to help astigmtism   Yes [provider]  cholecalciferol (VITAMIN D) 25 MCG (1000 UNIT) tablet Take 1,000 Units by mouth daily.   Yes [provider]  clobetasol ointment (TEMOVATE) 4.13 % Apply 1 application topically daily as needed (itching).  09/26/13  Yes [provider]  clopidogrel (PLAVIX) 75 MG tablet TAKE 1 TABLET(75 MG) BY MOUTH DAILY Patient taking differently: Take 75 mg by mouth daily. 09/10/21  Yes Cameron Sprang, MD  donepezil (ARICEPT) 5 MG tablet Take 1 tablet (5 mg total) by mouth daily. 09/10/21  Yes Cameron Sprang, MD  Empagliflozin-metFORMIN HCl ER (SYNJARDY XR) 03-999 MG TB24 Take 2 tablets by mouth daily. Patient taking differently: Take 0.5 tablets by mouth 2 (two) times daily. 08/31/21  Yes Elayne Snare, MD  LORazepam (ATIVAN) 0.5 MG tablet Take 0.5 mg by mouth every 8 (eight) hours as needed for anxiety.    Yes [provider]   memantine (NAMENDA) 10 MG tablet Take 1 tablet twice a day Patient taking differently: Take 10 mg by mouth 2 (two) times daily. 09/10/21  Yes Cameron Sprang, MD  promethazine (PHENERGAN) 12.5 MG tablet Take 1 tablet (12.5 mg total) by mouth every 6 (six) hours as needed for nausea or vomiting. 06/16/21  Yes Janith Lima, MD  VIAGRA 100 MG tablet TAKE ONE TABLET BY MOUTH AS NEEDED FOR ERECTILE DYSFUNCTION. Patient taking differently: Take 50 mg by mouth as needed for erectile dysfunction. 02/21/15  Yes Burchette, Alinda Sierras, MD  ACCU-CHEK COMPACT PLUS test strip use 1 TEST STRIP once daily as directed 07/05/16   Elayne Snare, MD  Continuous Blood Gluc Receiver (FREESTYLE LIBRE 14 DAY READER) [place] 1 each by Does not apply route See admin instructions. Use Freestyle Libre Reader to monitor blood sugars. 08/24/20   Elayne Snare, MD  Continuous Blood Gluc Sensor (FREESTYLE LIBRE 14 DAY SENSOR) MISC CHANGE EVERY 14 DAYS AS DIRECTED OR NEEDED 08/04/21   Elayne Snare, MD  Family History  Problem Relation Age of Onset   Ovarian cancer Mother    Cancer Mother        ?uterine   Leukemia Father    Heart disease Father    Diabetes Neg Hx     Social History   Socioeconomic History   Marital status: Married    Spouse name: Not on file   Number of children: 0   Years of education: Not on file   Highest education level: Not on file  Occupational History   Occupation: Runner, broadcasting/film/video    Employer: [employer]   Occupation: Retired  Tobacco Use   Smoking status: Former    Packs/day: 1.00    Years: 20.00    Pack years: 20.00    Types: Cigarettes    Quit date: 04/13/1984    Years since quitting: 37.5   Smokeless tobacco: Never  Vaping Use   Vaping Use: Never used  Substance and Sexual Activity   Alcohol use: Yes    Comment: 01/22/2015 "maybe 3 beers or glasses of wine /month"   Drug use: No   Sexual activity: Yes    Partners: Female  Other Topics Concern   Not on file  Social History  Narrative   Pt lives in [place] with spouse in 2 story home. He has three step children. 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 [place] Veteran. 14 years of education. Volunteer at [employer] 3 days a week. Right handed     Social Determinants of Health   Financial Resource Strain: Not on file  Food Insecurity: Not on file  Transportation Needs: Not on file  Physical Activity: Not on file  Stress: Not on file  Social Connections: Not on file     Review of Systems: A 12 point ROS discussed and pertinent positives are indicated in the HPI above.  All other systems are negative.  Review of Systems  Constitutional:  Negative for chills and fever.  Respiratory:  Negative for cough and shortness of breath.   Cardiovascular:  Negative for chest pain.  Gastrointestinal:  Negative for abdominal pain, diarrhea, nausea and vomiting.  Musculoskeletal:        (+) right hip pain  Neurological:  Negative for dizziness, light-headedness and headaches.   Vital Signs: BP 96/61    Pulse 81    Temp (!) 97.4 F (36.3 C) (Rectal)    Resp 16    Ht 6\' 4"  (1.93 m)    Wt 160 lb (72.6 kg)    SpO2 93%    BMI 19.48 kg/m   Physical Exam Vitals and nursing note reviewed.  Constitutional:      General: He is not in acute distress. HENT:     Head: Normocephalic.     Mouth/Throat:     Mouth: Mucous membranes are moist.     Pharynx: Oropharynx is clear. No oropharyngeal exudate or posterior oropharyngeal erythema.  Cardiovascular:     Rate and Rhythm: Normal rate and regular rhythm.     Comments: (+) left upper chest pacemaker Pulmonary:     Effort: Pulmonary effort is normal.     Breath sounds: Normal breath sounds.  Skin:    General: Skin is warm and dry.  Neurological:     Mental Status: He is alert. Mental status is at baseline.  Psychiatric:        Mood and Affect: Mood normal.        Behavior: Behavior normal.  Thought Content: Thought content normal.         Judgment: Judgment normal.     MD Evaluation Airway: WNL Heart: WNL Abdomen: Other (comments) Abdomen comments: Not assessed - pelvic rami fracture with internal bleeding Chest/ Lungs: WNL ASA  Classification: 3 Mallampati/Airway Score: Two   Imaging: CT Head Wo Contrast  Result Date: 10/20/2021 CLINICAL DATA:  Head neck trauma.  Found on ground.  Hypothermia. EXAM: CT HEAD WITHOUT CONTRAST CT CERVICAL SPINE WITHOUT CONTRAST TECHNIQUE: Multidetector CT imaging of the head and cervical spine was performed following the standard protocol without intravenous contrast. Multiplanar CT image reconstructions of the cervical spine were also generated. COMPARISON:  02/11/2020 head CT FINDINGS: CT HEAD FINDINGS Brain: No evidence of acute infarction, hemorrhage, hydrocephalus, extra-axial collection or mass lesion/mass effect. Remote left occipital and left cerebellar infarcts. Generalized brain atrophy. Vascular: Negative Skull: Normal. Negative for fracture or focal lesion. Sinuses/Orbits: No acute finding. CT CERVICAL SPINE FINDINGS Alignment: No traumatic malalignment Skull base and vertebrae: No acute fracture or aggressive bone lesion. Soft tissues and spinal canal: No prevertebral fluid or swelling. No visible canal hematoma. 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 Prominent adjacent disc and facet degeneration. Upper chest: Biapical pleural based scarring with calcification IMPRESSION: No evidence of acute intracranial or cervical spine injury. Electronically Signed   By: Jorje Guild M.D.   On: 10/20/2021 05:29   CT Cervical Spine Wo Contrast  Result Date: 10/20/2021 CLINICAL DATA:  Head neck trauma.  Found on ground.  Hypothermia. EXAM: CT HEAD WITHOUT CONTRAST CT CERVICAL SPINE WITHOUT CONTRAST TECHNIQUE: Multidetector CT imaging of the head and cervical spine was performed following the standard protocol without intravenous contrast. Multiplanar CT image reconstructions of the cervical spine were  also generated. COMPARISON:  02/11/2020 head CT FINDINGS: CT HEAD FINDINGS Brain: No evidence of acute infarction, hemorrhage, hydrocephalus, extra-axial collection or mass lesion/mass effect. Remote left occipital and left cerebellar infarcts. Generalized brain atrophy. Vascular: Negative Skull: Normal. Negative for fracture or focal lesion. Sinuses/Orbits: No acute finding. CT CERVICAL SPINE FINDINGS Alignment: No traumatic malalignment Skull base and vertebrae: No acute fracture or aggressive bone lesion. Soft tissues and spinal canal: No prevertebral fluid or swelling. No visible canal hematoma. 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 Prominent adjacent disc and facet degeneration. Upper chest: Biapical pleural based scarring with calcification IMPRESSION: No evidence of acute intracranial or cervical spine injury. Electronically Signed   By: Jorje Guild M.D.   On: 10/20/2021 05:29   CT Hip Right Wo Contrast  Result Date: 10/20/2021 CLINICAL DATA:  Hip trauma, fracture suspected, xray done EXAM: CT OF THE RIGHT HIP WITHOUT CONTRAST TECHNIQUE: Multidetector CT imaging of the right hip was performed according to the standard protocol. Multiplanar CT image reconstructions were also generated. COMPARISON:  Same day radiograph FINDINGS: Bones/Joint/Cartilage There are mildly displaced right superior and inferior pubic ramus fractures. There is no evidence of femoral neck fracture. The acetabulum is intact. 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 No focal bone lesion identified. Ligaments Suboptimally assessed by CT. Muscles and Tendons No muscle atrophy. Soft tissues Within the right hemipelvis, there is a high-density 10.9 x 5.8 x 8.3 cm collection and adjacent fluid/stranding. There is a mass effect on the bladder. IMPRESSION: Mildly displaced right superior and inferior pubis ramus fractures. Large hematoma within the right hemipelvis with adjacent fluid/stranding, measuring 10.9 x 5.8 x 8.3 cm. Electronically  Signed   By: Maurine Simmering M.D.   On: 10/20/2021 10:11   DG Chest [facility]  Result Date: 10/20/2021 CLINICAL DATA:  Sepsis evaluation. EXAM: PORTABLE CHEST 1 VIEW COMPARISON:  Portable chest 09/13/2016 FINDINGS: Cardiac size is normal. Left chest dual lead pacing system with AID wiring is again noted and unchanged. The lungs are mildly emphysematous but clear of infiltrates. There are overlying wires and artifacts. No pleural effusion is seen. Mild osteopenia. IMPRESSION: No active disease.  Stable COPD chest. Electronically Signed   By: Telford Nab M.D.   On: 10/20/2021 06:06   DG Hip Port Unilat W or Wo Pelvis 1 View Right  Result Date: 10/20/2021 CLINICAL DATA:  75 year old male with history of trauma from a fall. Right-sided hip pain. EXAM: DG HIP (WITH OR WITHOUT PELVIS) 1V PORT RIGHT COMPARISON:  No priors. FINDINGS: Three views of the bony pelvis in the right hip demonstrate no definite acute displaced fracture, subluxation or dislocation. There is joint space narrowing, subchondral sclerosis, subchondral cyst formation and osteophyte formation in the hip joints bilaterally, indicative of moderate to severe osteoarthritis. IMPRESSION: 1. No acute radiographic abnormality of the bony pelvis or the right hip. 2. 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 Electronically Signed   By: Vinnie Langton M.D.   On: 10/20/2021 06:06   CT Angio Abd/Pel w/ and/or w/o  Result Date: 10/20/2021 CLINICAL DATA:  Right pubic rami fractures. Hematoma in the right hemipelvis. Evaluate for ongoing bleeding. EXAM: CT ANGIOGRAPHY ABDOMEN AND PELVIS WITH CONTRAST TECHNIQUE: Multidetector CT imaging of the abdomen and pelvis was performed using the standard protocol during bolus administration of intravenous contrast. Multiplanar reconstructed images and MIPs were obtained and reviewed to evaluate the vascular anatomy. CONTRAST:  165mL OMNIPAQUE IOHEXOL 350 MG/ML SOLN COMPARISON:  CT right hip  10/20/2021 FINDINGS: VASCULAR Aorta: Normal caliber aorta without aneurysm, dissection, vasculitis or significant stenosis. Celiac: Patent without evidence of aneurysm, dissection, vasculitis or significant stenosis. SMA: Patent without evidence of aneurysm, dissection, vasculitis or significant stenosis. Renals: Both renal arteries are patent without evidence of aneurysm, dissection, vasculitis, fibromuscular dysplasia or significant stenosis. IMA: Patent without evidence of aneurysm, dissection, vasculitis or significant stenosis. Inflow: Patent without evidence of aneurysm, dissection, vasculitis or significant stenosis. Proximal Outflow: Proximal femoral arteries are patent bilaterally. There is a coronal mortis vascular variant originating from the distal right external iliac artery. Coronal mortis branches appear to be supplying areas of extravasation along the right side of the prostate on sequence 5 image 220 and near the right superior pubic ramus on sequence 5, image 208. These areas of contrast extravasation enlarge on the venous images. Veins: Portal venous system is patent. Hepatic veins are patent. Renal veins are patent. Limited evaluation of the iliac veins due to the timing of the study. Review of the MIP images confirms the above findings. NON-VASCULAR Lower chest: Small nodules in the left lower lobe and some could be calcified. Largest nodule measures approximately 3 mm. Dependent changes in the lower lungs. No pleural effusions. Patient has a of ICD. Hepatobiliary: Normal appearance of the liver. No biliary dilatation. Small calcified gallstone at the gallbladder base without distention or inflammatory changes. Pancreas: Unremarkable. No pancreatic ductal dilatation or surrounding inflammatory changes. Spleen: Normal in size without focal abnormality. Adrenals/Urinary Tract: Normal adrenal glands. 1.8 cm exophytic left renal cyst. No suspicious renal lesions. Probable small cyst in the right  kidney upper pole. Negative for hydronephrosis. Urinary bladder is displaced towards the left from the right hemipelvic hematoma. Stomach/Bowel: Stomach is within normal limits. Appendix appears normal. No evidence of bowel wall thickening, distention, or  inflammatory changes. Lymphatic: No lymph node enlargement in the abdomen or pelvis. Reproductive: Calcifications in the prostate. Small focus contrast extravasation along the right anterior aspect of the prostate. Seminal vesicles are slightly displaced towards the left. Other: There is a large right-sided pelvic hematoma that measures 8.2 x 6.0 x 5.9 cm. Small focus of bleeding within this large pelvic hematoma. Evidence for contrast accumulating along the right anterior aspect of the prostate compatible with contrast extravasation and bleeding. There is additional blood in the posterior right hemipelvis and there is blood tracking up along the anterior lower abdomen and into the right paracolic gutter region. Musculoskeletal: Minimally displaced right superior pubic ramus fracture. Displaced right inferior pubic ramus fracture. Fracture of the right inferior sacrum, best seen on sequence 3, image 848. Left pubic rami are intact. Both hips are located. Minimal retrolisthesis of L4 on L5. Disc space narrowing at L3-L4 and L4-L5. IMPRESSION: VASCULAR 1. Two foci of active bleeding in the right hemipelvis. Largest focus of bleeding is along the right anterior aspect of the prostate. Smaller focus of bleeding is near the right superior pubic ramus. These areas of bleeding appear to be supplied by a corona mortis branch that originates from the distal right external iliac artery. NON-VASCULAR 1. Fractures of the right superior and right inferior pubic ramus. Right sacral fracture. Large right hemipelvic hematoma with blood extending into the lower abdomen. Bladder and seminal vesicles are displaced from the hematoma. 2. Small nonspecific nodules in left lung base,  some of these could be calcified. Largest nodule measures 3 mm. No follow-up needed if patient is low-risk (and has no known or suspected primary neoplasm). Non-contrast chest CT can be considered in 12 months if patient is high-risk. This recommendation follows the consensus statement: Guidelines for Management of Incidental Pulmonary Nodules Detected on CT Images: From the Fleischner Society 2017; Radiology 2017; 284:228-243. 3. Cholelithiasis. No evidence for gallbladder distention or inflammation. These results were called by telephone at the time of interpretation on 10/20/2021 at 12:20 pm to provider Wynona Dove , who verbally acknowledged these results. Electronically Signed   By: Markus Daft M.D.   On: 10/20/2021 13:19    Labs:  CBC: Recent Labs    03/13/21 1005 10/20/21 0357  WBC 6.5 16.8*  HGB 16.5 14.2  HCT 49.0 43.2  PLT 142.0* 135*    COAGS: Recent Labs    10/20/21 0357  INR 1.1  APTT 25    BMP: Recent Labs    11/12/20 1342 03/13/21 1005 08/10/21 1613 10/20/21 0357  NA 138 142 140 138  K 4.4 4.8 4.1 3.9  CL 104 104 106 107  CO2 28 29 27  21*  GLUCOSE 217* 139* 190* 209*  BUN 13 11 10 15   CALCIUM 9.1 9.7 9.1 8.1*  CREATININE 1.35 1.22 1.14 1.32*  GFRNONAA  --   --   --  56*    LIVER FUNCTION TESTS: Recent Labs    03/13/21 1005  BILITOT 0.8  AST 15  ALT 6  ALKPHOS 95  PROT 7.2  ALBUMIN 4.4    TUMOR MARKERS: No results for input(s): AFPTM, CEA, CA199, CHROMGRNA in the last 8760 hours.  Assessment and Plan:  75 y/o M who presented to [facility] ED via EMS early this morning after fall on his front porch. He was found to have mildly displaced right superior and inferior pubis ramus fractures as well as two foci of active bleeding in the right hemipelvis. IR has been consulted for  abdominal/pelvic angiogram with possible embolization - patient history and imaging reviewed by IR attending, Dr. Serafina Royals, who approves patient for emergent procedure.  Patient has  been NPO except for sips of water (approximately 1/2 cup since 4 am), taking Plavix 75 mg QD. He will require ICU admission post IR procedure due arterial puncture -- will contact CCM to discuss. I have discussed this with hospitalist at bedside as well.  Risks and benefits of pelvic arteriogram with intervention were discussed with the patient including, but not limited to bleeding, infection, vascular injury, contrast induced renal failure, stroke, reperfusion hemorrhage, or even death.  This interventional procedure involves the use of X-rays and because of the nature of the planned procedure, it is possible that we will have prolonged use of X-ray fluoroscopy. Potential radiation risks to you include (but are not limited to) the following: - A slightly elevated risk for cancer  several years later in life. This risk is typically less than 0.5% percent. This risk is low in comparison to the normal incidence of human cancer, which is 33% for women and 50% for men according to the [place]. - Radiation induced injury can include skin redness, resembling a rash, tissue breakdown / ulcers and hair loss (which can be temporary or permanent).  The likelihood of either of these occurring depends on the difficulty of the procedure and whether you are sensitive to radiation due to previous procedures, disease, or genetic conditions.  IF your procedure requires a prolonged use of radiation, you will be notified and given written instructions for further action.  It is your responsibility to monitor the irradiated area for the 2 weeks following the procedure and to notify your physician if you are concerned that you have suffered a radiation induced injury.    All of the patient's questions were answered, patient is agreeable to proceed.  Consent signed and in chart.  Thank you for this interesting consult.  I greatly enjoyed meeting ERASMO VERTZ and look forward to participating in their  care.  A copy of this report was sent to the requesting provider on this date.  Electronically Signed: Joaquim Nam, PA-C 10/20/2021, 1:28 PM   I spent a total of 40 Minutes in face to face in clinical consultation, greater than 50% of which was counseling/coordinating care for pelvic angiogram with possible embolization.

## 2021-10-20 NOTE — ED Triage Notes (Addendum)
Pt BIB [facility] from home following a fall when bending over to pick up paper. Pt was unable to get up was on the ground for approx 1 hour and is now hypothermic. Pt denies LOC. EMS reports T 94. C-collar in place on arrival. C/o ongoing dizziness. No blood thinners.   Pt states having pacemaker with recent battery replacement. Has been experiencing intermittent dizziness. Denies dizziness prior to fall. Rectal 95.6  on arrival

## 2021-10-20 NOTE — H&P (Addendum)
History and Physical    Kevin [name] IWP:[id number] DOB: Jan 14, 1946 DOA: 10/20/2021  PCP: [name], MD Consultants:  neurology: Dr. Delice Lesch, cardiology: Dr. Rayann Heman, urology and nephrology at [facility], endocrinology: Dr. Dwyane Dee  Patient coming from:  Home - lives with his wife   Chief Complaint: mechanical fall with hip pain   HPI: Kevin [name] is a 75 y.o. male with medical history significant of combined systolic and diastolic CHF, biventricular automatic implantable cardioverter defibrillator, hx of prostate cancer, SSS s/p pacemaker, HLD, T2DM, HTN, CKD stage IIIa (1.2-1.3), mild vascular dementia, who presented to ED after mechanical fall this AM.  He had gotten up around 2:30am to go and get newspaper and somehow fell off the front porch/steps. He is unsure how he fell and thinks he landed on the right hip. He does have intermittent dizziness and wife wonders if he was dizzy and lost his balance and fell. He can not remember. He thinks he was outside in the cold for an hour or so. The neighbors heard him yelling, called 911 and woke up his wife.   He otherwise has been feeling and doing well. Denies any fever/chills, vision changes, chest pain or palpitations, shortness of breath or cough, abdominal pain, N/V/D, leg swelling, dysuria.   He has had some headaches lately and since his fall is having a hard time urinating, has had to have an in and out cath x 1 in ED.     ED Course: vitals: 95.6 rectal, bp: 138/107, HR: 70, RR: 21, oxygen: 94% RA Pertinent labs: WBC 16.8, MCV 102.6, platelets 135, creatinine 1.32, CT head: No evidence of acute intracranial or cervical spine injury, Hip x-ray: No acute abnormality of pelvis or right hip.  Moderate to severe bilateral hip joint OA. Chest x-ray: No active disease. CT right hip: Mildly displaced right superior and inferior pubis ramus fractures.  Large hematoma within the right hemipelvis with adjacent fluid stranding measuring  10.9 x 5.8 x 8.3 cm CTA a/p obtained and found to have bleed from coronal mortis branches and will require urgent intervention by IR today.  In ED: Patient given a total of 2.5 L fluid bolus, blood cultures obtained, and sepsis protocol initiated.  Orthopedic surgery was consulted and [facility] was asked to admit.  Review of Systems: As per HPI; otherwise review of systems reviewed and negative.   Ambulatory Status:  Ambulates without assistance  Past Medical History:  Diagnosis Date   Agent orange exposure    in [place]   Anemia    Heart block AV second degree    a. s/p PPM implant with subsequent CRTD upgrade   High cholesterol    History of colon polyps    Hypertension    LBBB (left bundle branch block)    Nonischemic cardiomyopathy ([place])    a. MDT CRTD upgrade 2016   Pacemaker 08/27/2013   Dual-chamber Medtronic Adapta implanted January 2013 for bradycardia with alternating bundle branch block and 2:1 atrioventricular block    Prostate cancer ([place])    "not been treated yet; on a wait and see" (01/22/2015)   Type II diabetes mellitus ([place])     Past Surgical History:  Procedure Laterality Date   BASAL CELL CARCINOMA EXCISION     "back, face, neck"   BI-VENTRICULAR IMPLANTABLE CARDIOVERTER DEFIBRILLATOR UPGRADE N/A 01/22/2015   MDT CRTD upgrade by Dr Rayann Heman   BIV ICD GENERATOR CHANGEOUT N/A 08/28/2020   Procedure: BIV ICD GENERATOR CHANGEOUT;  Surgeon: [name],  MD;  Location: [facility];  Service: Cardiovascular;  Laterality: N/A;   CARDIAC CATHETERIZATION  01/27/2012   normal coronaries, dilated LV c/w nonischemic cardiomyopathy   EXAMINATION UNDER ANESTHESIA  04/06/2012   Procedure: EXAM UNDER ANESTHESIA;  Surgeon: Marcello Moores A. Cornett, MD;  Location: [place];  Service: General;  Laterality: N/A;   INCISION AND DRAINAGE PERIRECTAL ABSCESS  ~ 2010; 2015   INGUINAL HERNIA REPAIR Left 1980   LEFT HEART CATHETERIZATION WITH CORONARY ANGIOGRAM N/A 01/27/2012   Procedure: LEFT  HEART CATHETERIZATION WITH CORONARY ANGIOGRAM;  Surgeon: Troy Sine, MD;  Location: [facility] CATH LAB;  Service: Cardiovascular;  Laterality: N/A;   NM MYOCAR PERF WALL MOTION  01/21/2012   mod-severe perfusion defect due to infarct/scar w/mild perinfarct ischemia in the apical,basal inferoseptal,basal inferior,mid inferoseptal,midinferior and apical inferior regions   pacemaker battery     PERMANENT PACEMAKER INSERTION N/A 12/01/2011   Medtronic implanted by Dr Sallyanne Kuster   PROSTATE BIOPSY     PROSTATE BIOPSY     shrapnel surgery     "in [place]"   TONSILLECTOMY      Social History   Socioeconomic History   Marital status: Married    Spouse name: Not on file   Number of children: 0   Years of education: Not on file   Highest education level: Not on file  Occupational History   Occupation: Runner, broadcasting/film/video    Employer: [employer]   Occupation: Retired  Tobacco Use   Smoking status: Former    Packs/day: 1.00    Years: 20.00    Pack years: 20.00    Types: Cigarettes    Quit date: 04/13/1984    Years since quitting: 37.5   Smokeless tobacco: Never  Vaping Use   Vaping Use: Never used  Substance and Sexual Activity   Alcohol use: Yes    Comment: 01/22/2015 "maybe 3 beers or glasses of wine /month"   Drug use: No   Sexual activity: Yes    Partners: Female  Other Topics Concern   Not on file  Social History Narrative   Pt lives in [place] with spouse in 2 story home. He has three step children. 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 [place] Veteran. 14 years of education. Volunteer at [employer] 3 days a week. Right handed     Social Determinants of Health   Financial Resource Strain: Not on file  Food Insecurity: Not on file  Transportation Needs: Not on file  Physical Activity: Not on file  Stress: Not on file  Social Connections: Not on file  Intimate Partner Violence: Not on file    Allergies  Allergen Reactions   Sulfa Antibiotics  Swelling    Swelling of lips only.   5-Alpha Reductase Inhibitors     Family History  Problem Relation Age of Onset   Ovarian cancer Mother    Cancer Mother        ?uterine   Leukemia Father    Heart disease Father    Diabetes Neg Hx     Prior to Admission medications   Medication Sig Start Date End Date Taking? Authorizing Provider  acetaminophen (TYLENOL) 500 MG tablet Take 500 mg by mouth every 6 (six) hours as needed for moderate pain or headache.   Yes [provider]  atorvastatin (LIPITOR) 80 MG tablet Take 40 mg by mouth every evening.   Yes [provider]  buPROPion (WELLBUTRIN XL) 150 MG 24 hr tablet  Take 2 tablets in AM, 1 tablet in PM Patient taking differently: Take 150-300 mg by mouth See admin instructions. 300 mg in the morning  150 mg at bedtime 09/10/21  Yes Cameron Sprang, MD  Carboxymethylcellulose Sodium (EYE DROPS OP) Place 1 drop into both eyes 3 (three) times daily. Given by [facility] doctor to help astigmtism   Yes [provider]  cholecalciferol (VITAMIN D) 25 MCG (1000 UNIT) tablet Take 1,000 Units by mouth daily.   Yes [provider]  clobetasol ointment (TEMOVATE) 9.48 % Apply 1 application topically daily as needed (itching).  09/26/13  Yes [provider]  clopidogrel (PLAVIX) 75 MG tablet TAKE 1 TABLET(75 MG) BY MOUTH DAILY Patient taking differently: Take 75 mg by mouth daily. 09/10/21  Yes Cameron Sprang, MD  donepezil (ARICEPT) 5 MG tablet Take 1 tablet (5 mg total) by mouth daily. 09/10/21  Yes Cameron Sprang, MD  Empagliflozin-metFORMIN HCl ER (SYNJARDY XR) 03-999 MG TB24 Take 2 tablets by mouth daily. Patient taking differently: Take 0.5 tablets by mouth 2 (two) times daily. 08/31/21  Yes Elayne Snare, MD  LORazepam (ATIVAN) 0.5 MG tablet Take 0.5 mg by mouth every 8 (eight) hours as needed for anxiety.    Yes [provider]  memantine (NAMENDA) 10 MG tablet Take 1 tablet twice a day Patient taking  differently: Take 10 mg by mouth 2 (two) times daily. 09/10/21  Yes Cameron Sprang, MD  promethazine (PHENERGAN) 12.5 MG tablet Take 1 tablet (12.5 mg total) by mouth every 6 (six) hours as needed for nausea or vomiting. 06/16/21  Yes Janith Lima, MD  VIAGRA 100 MG tablet TAKE ONE TABLET BY MOUTH AS NEEDED FOR ERECTILE DYSFUNCTION. Patient taking differently: Take 50 mg by mouth as needed for erectile dysfunction. 02/21/15  Yes Burchette, Alinda Sierras, MD  ACCU-CHEK COMPACT PLUS test strip use 1 TEST STRIP once daily as directed 07/05/16   Elayne Snare, MD  Continuous Blood Gluc Receiver (FREESTYLE LIBRE 14 DAY READER) [place] 1 each by Does not apply route See admin instructions. Use Freestyle Libre Reader to monitor blood sugars. 08/24/20   Elayne Snare, MD  Continuous Blood Gluc Sensor (FREESTYLE LIBRE 14 DAY SENSOR) MISC CHANGE EVERY 14 DAYS AS DIRECTED OR NEEDED 08/04/21   Elayne Snare, MD    Physical Exam: Vitals:   10/20/21 0845 10/20/21 1030 10/20/21 1100 10/20/21 1245  BP: 94/63 (!) 90/53 (!) 101/55 128/62  Pulse: 97 83 79 81  Resp:  17 19 19   Temp:      TempSrc:      SpO2: 96% 91% 94% 95%  Weight:      Height:         General:  Appears calm and comfortable and is in NAD Eyes:  PERRL, EOMI, normal lids, iris ENT:  grossly normal hearing, lips & tongue, mmm; dentures Neck:  no LAD, masses or thyromegaly; no carotid bruits Cardiovascular:  RRR, no m/r/g. No LE edema.  Respiratory:   CTA bilaterally with no wheezes/rales/rhonchi.  Normal respiratory effort. Abdomen:  soft, TTP over bladder as he needs to urinate, ND, NABS Back:   normal alignment, no CVAT Skin:  no rash or induration seen on limited exam. Multiple SK over abdomen Musculoskeletal:  grossly normal tone BUE/BLE, good ROM, no bony abnormality. Can raise bilateral LE, no TTP over hips.  Lower extremity:  No LE edema.  Limited foot exam with no ulcerations.  2+ distal pulses. Psychiatric:  grossly normal mood  and affect, speech  fluent and appropriate, AOx3 Neurologic:  CN 2-12 grossly intact, moves all extremities in coordinated fashion, sensation intact   Radiological Exams on Admission: Independently reviewed - see discussion in A/P where applicable  CT Head Wo Contrast  Result Date: 10/20/2021 CLINICAL DATA:  Head neck trauma.  Found on ground.  Hypothermia. EXAM: CT HEAD WITHOUT CONTRAST CT CERVICAL SPINE WITHOUT CONTRAST TECHNIQUE: Multidetector CT imaging of the head and cervical spine was performed following the standard protocol without intravenous contrast. Multiplanar CT image reconstructions of the cervical spine were also generated. COMPARISON:  02/11/2020 head CT FINDINGS: CT HEAD FINDINGS Brain: No evidence of acute infarction, hemorrhage, hydrocephalus, extra-axial collection or mass lesion/mass effect. Remote left occipital and left cerebellar infarcts. Generalized brain atrophy. Vascular: Negative Skull: Normal. Negative for fracture or focal lesion. Sinuses/Orbits: No acute finding. CT CERVICAL SPINE FINDINGS Alignment: No traumatic malalignment Skull base and vertebrae: No acute fracture or aggressive bone lesion. Soft tissues and spinal canal: No prevertebral fluid or swelling. No visible canal hematoma. 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 Prominent adjacent disc and facet degeneration. Upper chest: Biapical pleural based scarring with calcification IMPRESSION: No evidence of acute intracranial or cervical spine injury. Electronically Signed   By: Jorje Guild M.D.   On: 10/20/2021 05:29   CT Cervical Spine Wo Contrast  Result Date: 10/20/2021 CLINICAL DATA:  Head neck trauma.  Found on ground.  Hypothermia. EXAM: CT HEAD WITHOUT CONTRAST CT CERVICAL SPINE WITHOUT CONTRAST TECHNIQUE: Multidetector CT imaging of the head and cervical spine was performed following the standard protocol without intravenous contrast. Multiplanar CT image reconstructions of the cervical spine were also generated. COMPARISON:   02/11/2020 head CT FINDINGS: CT HEAD FINDINGS Brain: No evidence of acute infarction, hemorrhage, hydrocephalus, extra-axial collection or mass lesion/mass effect. Remote left occipital and left cerebellar infarcts. Generalized brain atrophy. Vascular: Negative Skull: Normal. Negative for fracture or focal lesion. Sinuses/Orbits: No acute finding. CT CERVICAL SPINE FINDINGS Alignment: No traumatic malalignment Skull base and vertebrae: No acute fracture or aggressive bone lesion. Soft tissues and spinal canal: No prevertebral fluid or swelling. No visible canal hematoma. 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 Prominent adjacent disc and facet degeneration. Upper chest: Biapical pleural based scarring with calcification IMPRESSION: No evidence of acute intracranial or cervical spine injury. Electronically Signed   By: Jorje Guild M.D.   On: 10/20/2021 05:29   CT Hip Right Wo Contrast  Result Date: 10/20/2021 CLINICAL DATA:  Hip trauma, fracture suspected, xray done EXAM: CT OF THE RIGHT HIP WITHOUT CONTRAST TECHNIQUE: Multidetector CT imaging of the right hip was performed according to the standard protocol. Multiplanar CT image reconstructions were also generated. COMPARISON:  Same day radiograph FINDINGS: Bones/Joint/Cartilage There are mildly displaced right superior and inferior pubic ramus fractures. There is no evidence of femoral neck fracture. The acetabulum is intact. 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 No focal bone lesion identified. Ligaments Suboptimally assessed by CT. Muscles and Tendons No muscle atrophy. Soft tissues Within the right hemipelvis, there is a high-density 10.9 x 5.8 x 8.3 cm collection and adjacent fluid/stranding. There is a mass effect on the bladder. IMPRESSION: Mildly displaced right superior and inferior pubis ramus fractures. Large hematoma within the right hemipelvis with adjacent fluid/stranding, measuring 10.9 x 5.8 x 8.3 cm. Electronically Signed   By: Maurine Simmering M.D.    On: 10/20/2021 10:11   DG Chest Port 1 View  Result Date: 10/20/2021 CLINICAL DATA:  Sepsis evaluation. EXAM: PORTABLE CHEST 1 VIEW COMPARISON:  Portable chest 09/13/2016 FINDINGS: Cardiac size is normal. Left chest dual lead pacing system with AID wiring is again noted and unchanged. The lungs are mildly emphysematous but clear of infiltrates. There are overlying wires and artifacts. No pleural effusion is seen. Mild osteopenia. IMPRESSION: No active disease.  Stable COPD chest. Electronically Signed   By: Telford Nab M.D.   On: 10/20/2021 06:06   DG Hip Port Unilat W or Wo Pelvis 1 View Right  Result Date: 10/20/2021 CLINICAL DATA:  75 year old male with history of trauma from a fall. Right-sided hip pain. EXAM: DG HIP (WITH OR WITHOUT PELVIS) 1V PORT RIGHT COMPARISON:  No priors. FINDINGS: Three views of the bony pelvis in the right hip demonstrate no definite acute displaced fracture, subluxation or dislocation. There is joint space narrowing, subchondral sclerosis, subchondral cyst formation and osteophyte formation in the hip joints bilaterally, indicative of moderate to severe osteoarthritis. IMPRESSION: 1. No acute radiographic abnormality of the bony pelvis or the right hip. 2. 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 Electronically Signed   By: Vinnie Langton M.D.   On: 10/20/2021 06:06    EKG: Independently reviewed.  NSR with rate 71, atrial paced. RBBB; nonspecific ST changes with no evidence of acute ischemia   Labs on Admission: I have personally reviewed the available labs and imaging studies at the time of the admission.  Pertinent labs:  WBC 16.8,  MCV 102.6,  platelets 135,  creatinine 1.32 (1.2-1.3)  Assessment/Plan Mechanical fall with pubis ramus fracture and large pelvic hematoma with active bleeding from corona mortis  75 year old with mechanical fall found to have inferior and superior pubic ramus fracture, fracture of right inferior sacrum and  two foci of active bleeding in the right hemipelvis.  -admit to progressive -stat cbc and cross and screen -IR has seen with plans for angiogram and possible embolization emergently  -hold plavix/dvt ppx -will need PT when able and weight bearing as tolerated for pubis ramus fracture and f/u with ortho in one week  -serial CBC, transfuse to keep hgb >7  Leukocytosis Likely reactive, no clinical finding or high suspicion of infection Will continue to monitor and follow clinically   Hypothermia Code sepsis initially called, but hypothermia due to environmental exposures Has resolved   Urinary retention Pelvic hematoma with mass effect on bladder, likely contributing.  In and out and may need foley catheter   CKD stage IIIa Per nephrology notes, baseline creatinine: 1.2-1.3 At baseline, continue to monitor  Hypertension Soft blood pressure in setting of pelvic bleed. On no medication.  No shock, maintaining MAP, monitor closely   Hyperlipidemia Continue statin.   Combined systolic and diastolic dysfunction with biventricular ICD Euvolemic on exam Echo 10/21: EF of 40-45%. Mildly decreased LVF with global hypokinesis. Grade I diastolic dysfunction. Mildly reduced right VF.  Received 2.5L of IVF Monitor I/O   Type 2 diabetes mellitus A1c in 04/2021 was 6.5 Hold oral medication, SSI and routine accuchecks per protocol   Mild vascular dementia Continue aricept/namenda Delirium precautions  [facility], was interested in possible treatment, sees urology.    Hx of stroke in 02/2020 Given DAPT x 3 months and now on plavix alone Hold plavix with large hematoma and active bleed   Depression Continue wellbutrin    Body mass index is 19.48 kg/m.    Level of care:  DVT prophylaxis:  SCDs Code Status:  DNR - confirmed with  patient/family Family Communication: wife at bedside: [name] Carpino  Disposition  Plan:  The patient is from: home  Anticipated d/c is to: per day team   Patient placed in observation as anticipate less than 2 midnight stay. Requires hospitalization for active pelvic bleed following fall requiring emergent intervention by IR, close monitoring and assessment and MDM with specialists.      Patient is currently: stable.  Consults called: IR/ortho  Admission status:  observation    Orma Flaming MD [facility]   How to contact the [facility] Attending or Consulting provider [place] or covering provider during after hours [place], for this patient?  Check the care team in [facility] and look for a) attending/consulting [facility] provider listed and b) the [facility] team listed Log into [email or web address] and use [place]'s universal password to access. If you do not have the password, please contact the hospital operator. Locate the [facility] provider you are looking for under [facility] and page to a number that you can be directly reached. If you still have difficulty reaching the provider, please page the [facility] (Director on Call) for the Hospitalists listed on amion for assistance.   10/20/2021, 12:54 PM

## 2021-10-20 NOTE — Progress Notes (Signed)
Patient's urine is bloody but seems to be clearing up. MD Suttle made aware. Per MD likely related to procedure. Will continue to monitor.

## 2021-10-20 NOTE — ED Notes (Signed)
Pt given water and was able to tolerate it. Pt stood at the bedside to try to walk to the bathroom but pt states he is unable to due to dizzinesss and pain in right leg. MD made aware

## 2021-10-21 DIAGNOSIS — I130 Hypertensive heart and chronic kidney disease with heart failure and stage 1 through stage 4 chronic kidney disease, or unspecified chronic kidney disease: Secondary | ICD-10-CM | POA: Diagnosis present

## 2021-10-21 DIAGNOSIS — W19XXXD Unspecified fall, subsequent encounter: Secondary | ICD-10-CM | POA: Diagnosis not present

## 2021-10-21 DIAGNOSIS — E1122 Type 2 diabetes mellitus with diabetic chronic kidney disease: Secondary | ICD-10-CM | POA: Diagnosis present

## 2021-10-21 DIAGNOSIS — S3792XA Contusion of unspecified urinary and pelvic organ, initial encounter: Secondary | ICD-10-CM | POA: Diagnosis present

## 2021-10-21 DIAGNOSIS — S32591S Other specified fracture of right pubis, sequela: Secondary | ICD-10-CM | POA: Diagnosis not present

## 2021-10-21 DIAGNOSIS — F01A3 Vascular dementia, mild, with mood disturbance: Secondary | ICD-10-CM | POA: Diagnosis present

## 2021-10-21 DIAGNOSIS — R319 Hematuria, unspecified: Secondary | ICD-10-CM | POA: Diagnosis present

## 2021-10-21 DIAGNOSIS — R58 Hemorrhage, not elsewhere classified: Secondary | ICD-10-CM | POA: Diagnosis present

## 2021-10-21 DIAGNOSIS — S32599S Other specified fracture of unspecified pubis, sequela: Secondary | ICD-10-CM | POA: Diagnosis not present

## 2021-10-21 DIAGNOSIS — M160 Bilateral primary osteoarthritis of hip: Secondary | ICD-10-CM | POA: Diagnosis present

## 2021-10-21 DIAGNOSIS — S065XAA Traumatic subdural hemorrhage with loss of consciousness status unknown, initial encounter: Secondary | ICD-10-CM | POA: Diagnosis not present

## 2021-10-21 DIAGNOSIS — I5042 Chronic combined systolic (congestive) and diastolic (congestive) heart failure: Secondary | ICD-10-CM

## 2021-10-21 DIAGNOSIS — Z8546 Personal history of malignant neoplasm of prostate: Secondary | ICD-10-CM | POA: Diagnosis not present

## 2021-10-21 DIAGNOSIS — W19XXXA Unspecified fall, initial encounter: Secondary | ICD-10-CM | POA: Diagnosis present

## 2021-10-21 DIAGNOSIS — F02A3 Dementia in other diseases classified elsewhere, mild, with mood disturbance: Secondary | ICD-10-CM | POA: Diagnosis present

## 2021-10-21 DIAGNOSIS — N1832 Chronic kidney disease, stage 3b: Secondary | ICD-10-CM | POA: Diagnosis present

## 2021-10-21 DIAGNOSIS — Z888 Allergy status to other drugs, medicaments and biological substances status: Secondary | ICD-10-CM | POA: Diagnosis not present

## 2021-10-21 DIAGNOSIS — S32591A Other specified fracture of right pubis, initial encounter for closed fracture: Secondary | ICD-10-CM | POA: Diagnosis not present

## 2021-10-21 DIAGNOSIS — E1165 Type 2 diabetes mellitus with hyperglycemia: Secondary | ICD-10-CM | POA: Diagnosis present

## 2021-10-21 DIAGNOSIS — D62 Acute posthemorrhagic anemia: Secondary | ICD-10-CM | POA: Diagnosis not present

## 2021-10-21 DIAGNOSIS — I495 Sick sinus syndrome: Secondary | ICD-10-CM | POA: Diagnosis present

## 2021-10-21 DIAGNOSIS — Z20822 Contact with and (suspected) exposure to covid-19: Secondary | ICD-10-CM | POA: Diagnosis present

## 2021-10-21 DIAGNOSIS — N501 Vascular disorders of male genital organs: Secondary | ICD-10-CM

## 2021-10-21 DIAGNOSIS — G309 Alzheimer's disease, unspecified: Secondary | ICD-10-CM | POA: Diagnosis present

## 2021-10-21 DIAGNOSIS — W1830XA Fall on same level, unspecified, initial encounter: Secondary | ICD-10-CM | POA: Diagnosis present

## 2021-10-21 DIAGNOSIS — S32591D Other specified fracture of right pubis, subsequent encounter for fracture with routine healing: Secondary | ICD-10-CM | POA: Diagnosis not present

## 2021-10-21 DIAGNOSIS — S32599A Other specified fracture of unspecified pubis, initial encounter for closed fracture: Secondary | ICD-10-CM | POA: Diagnosis present

## 2021-10-21 DIAGNOSIS — Z95810 Presence of automatic (implantable) cardiac defibrillator: Secondary | ICD-10-CM | POA: Diagnosis not present

## 2021-10-21 DIAGNOSIS — I428 Other cardiomyopathies: Secondary | ICD-10-CM | POA: Diagnosis present

## 2021-10-21 DIAGNOSIS — Z66 Do not resuscitate: Secondary | ICD-10-CM | POA: Diagnosis present

## 2021-10-21 DIAGNOSIS — E119 Type 2 diabetes mellitus without complications: Secondary | ICD-10-CM | POA: Diagnosis not present

## 2021-10-21 DIAGNOSIS — I10 Essential (primary) hypertension: Secondary | ICD-10-CM

## 2021-10-21 DIAGNOSIS — E785 Hyperlipidemia, unspecified: Secondary | ICD-10-CM | POA: Diagnosis not present

## 2021-10-21 DIAGNOSIS — R339 Retention of urine, unspecified: Secondary | ICD-10-CM | POA: Diagnosis present

## 2021-10-21 DIAGNOSIS — E440 Moderate protein-calorie malnutrition: Secondary | ICD-10-CM | POA: Diagnosis not present

## 2021-10-21 DIAGNOSIS — Z882 Allergy status to sulfonamides status: Secondary | ICD-10-CM | POA: Diagnosis not present

## 2021-10-21 DIAGNOSIS — E7800 Pure hypercholesterolemia, unspecified: Secondary | ICD-10-CM | POA: Diagnosis present

## 2021-10-21 LAB — GLUCOSE, CAPILLARY
Glucose-Capillary: 115 mg/dL — ABNORMAL HIGH (ref 70–99)
Glucose-Capillary: 177 mg/dL — ABNORMAL HIGH (ref 70–99)
Glucose-Capillary: 156 mg/dL — ABNORMAL HIGH (ref 70–99)
Glucose-Capillary: 111 mg/dL — ABNORMAL HIGH (ref 70–99)

## 2021-10-21 LAB — BASIC METABOLIC PANEL
Anion gap: 4 — ABNORMAL LOW (ref 5–15)
BUN: 14 mg/dL (ref 8–23)
CO2: 25 mmol/L (ref 22–32)
Calcium: 8.3 mg/dL — ABNORMAL LOW (ref 8.9–10.3)
Chloride: 106 mmol/L (ref 98–111)
Creatinine, Ser: 1.25 mg/dL — ABNORMAL HIGH (ref 0.61–1.24)
GFR, Estimated: >60 mL/min (ref >60)
Glucose, Bld: 166 mg/dL — ABNORMAL HIGH (ref 70–99)
Potassium: 4.6 mmol/L (ref 3.5–5.1)
Sodium: 135 mmol/L (ref 135–145)

## 2021-10-21 LAB — FERRITIN: Ferritin: 108 ng/mL (ref 24–336)

## 2021-10-21 LAB — RESP PANEL BY RT-PCR (FLU A&B, COVID) ARPGX2
Influenza A by PCR: NEGATIVE
Influenza B by PCR: NEGATIVE
SARS Coronavirus 2 by RT PCR: NEGATIVE

## 2021-10-21 LAB — IRON AND TIBC
Iron: 86 ug/dL (ref 45–182)
Saturation Ratios: 30 % (ref 17.9–39.5)
TIBC: 287 ug/dL (ref 250–450)
UIBC: 201 ug/dL

## 2021-10-21 LAB — URINE CULTURE: Culture: NO GROWTH

## 2021-10-21 LAB — CBC
HCT: 32.8 % — ABNORMAL LOW (ref 39.0–52.0)
Hemoglobin: 10.6 g/dL — ABNORMAL LOW (ref 13.0–17.0)
MCH: 32.8 pg (ref 26.0–34.0)
MCHC: 32.3 g/dL (ref 30.0–36.0)
MCV: 101.5 fL — ABNORMAL HIGH (ref 80.0–100.0)
Platelets: 103 10*3/uL — ABNORMAL LOW (ref 150–400)
RBC: 3.23 MIL/uL — ABNORMAL LOW (ref 4.22–5.81)
RDW: 12.4 % (ref 11.5–15.5)
WBC: 8.6 10*3/uL (ref 4.0–10.5)
nRBC: 0 % (ref 0.0–0.2)

## 2021-10-21 LAB — RETICULOCYTES
Immature Retic Fract: 14.2 % (ref 2.3–15.9)
RBC.: 3.19 MIL/uL — ABNORMAL LOW (ref 4.22–5.81)
Retic Count, Absolute: 51.4 10*3/uL (ref 19.0–186.0)
Retic Ct Pct: 1.6 % (ref 0.4–3.1)

## 2021-10-21 LAB — FOLATE: Folate: 11 ng/mL (ref >5.9)

## 2021-10-21 LAB — VITAMIN B12: Vitamin B-12: 306 pg/mL (ref 180–914)

## 2021-10-21 MED ORDER — CHLORHEXIDINE GLUCONATE CLOTH 2 % EX PADS
6.0000 | MEDICATED_PAD | Freq: Every day | CUTANEOUS | Status: DC
  Administered 2021-10-21 – 2021-10-22 (×2): 6 via TOPICAL

## 2021-10-21 NOTE — Progress Notes (Signed)
Referring Physician(s): Dr. 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   Subjective: Patient sitting up in the chair in his room, his wife is at the bedside. He states he is feeling well other than some right hip pain and weakness. He requested assistance getting back into bed and with the use of his walker and some assistance from his wife we were able to get him safely back into bed. Left groin vascular access site is clean, soft and dry.   Allergies: Sulfa antibiotics and 5-alpha reductase inhibitors  Medications: Prior to Admission medications   Medication Sig Start Date End Date Taking? Authorizing Provider  acetaminophen (TYLENOL) 500 MG tablet Take 500 mg by mouth every 6 (six) hours as needed for moderate pain or headache.   Yes [provider]  atorvastatin (LIPITOR) 80 MG tablet Take 40 mg by mouth every evening.   Yes [provider]  buPROPion (WELLBUTRIN XL) 150 MG 24 hr tablet Take 2 tablets in AM, 1 tablet in PM Patient taking differently: Take 150-300 mg by mouth See admin instructions. 300 mg in the morning  150 mg at bedtime 09/10/21  Yes Cameron Sprang, MD  Carboxymethylcellulose Sodium (EYE DROPS OP) Place 1 drop into both eyes 3 (three) times daily. Given by [facility] doctor to help astigmtism   Yes [provider]  cholecalciferol (VITAMIN D) 25 MCG (1000 UNIT) tablet Take 1,000 Units by mouth daily.   Yes [provider]  clobetasol ointment (TEMOVATE) 5.10 % Apply 1 application topically daily as needed (itching).  09/26/13  Yes [provider]  clopidogrel (PLAVIX) 75 MG tablet TAKE 1 TABLET(75 MG) BY MOUTH DAILY Patient taking differently: Take 75 mg  by mouth daily. 09/10/21  Yes Cameron Sprang, MD  donepezil (ARICEPT) 5 MG tablet Take 1 tablet (5 mg total) by mouth daily. 09/10/21  Yes Cameron Sprang, MD  Empagliflozin-metFORMIN HCl ER (SYNJARDY XR) 03-999 MG TB24 Take 2 tablets by mouth daily. Patient taking differently: Take 0.5 tablets by mouth 2 (two) times daily. 08/31/21  Yes Elayne Snare, MD  LORazepam (ATIVAN) 0.5 MG tablet Take 0.5 mg by mouth every 8 (eight) hours as needed for anxiety.    Yes [provider]  memantine (NAMENDA) 10 MG tablet Take 1 tablet twice a day Patient taking differently: Take 10 mg by mouth 2 (two) times daily. 09/10/21  Yes Cameron Sprang, MD  promethazine (PHENERGAN) 12.5 MG tablet Take 1 tablet (12.5 mg total) by mouth every 6 (six) hours as needed for nausea or vomiting. 06/16/21  Yes Janith Lima, MD  VIAGRA 100 MG tablet TAKE ONE TABLET BY MOUTH AS NEEDED FOR ERECTILE DYSFUNCTION. Patient taking differently: Take 50 mg by mouth as needed for erectile dysfunction. 02/21/15  Yes Burchette, Alinda Sierras, MD  ACCU-CHEK COMPACT PLUS test strip use 1 TEST STRIP once daily as directed 07/05/16   Elayne Snare, MD  Continuous Blood Gluc Receiver (FREESTYLE LIBRE 14 DAY READER) [place] 1 each by Does not apply route See admin instructions. Use Freestyle Libre Reader to monitor blood sugars. 08/24/20   Elayne Snare, MD  Continuous Blood Gluc Sensor (FREESTYLE LIBRE 14 DAY SENSOR) MISC CHANGE EVERY 14 DAYS AS DIRECTED OR NEEDED 08/04/21  Elayne Snare, MD     Vital Signs: BP 127/70 (BP Location: Left Arm)    Pulse 78    Temp 98 F (36.7 C) (Oral)    Resp 18    Ht 6\' 4"  (1.93 m)    Wt 169 lb 8.5 oz (76.9 kg)    SpO2 95%    BMI 20.64 kg/m   Physical Exam Constitutional:      General: He is not in acute distress. Cardiovascular:     Comments: Left groin vascular site is clean, soft, dry and non-tender. No ecchymosis or other signs of hematoma or pseudoaneurysm. Dressing is intact, clean and dry.  Pulmonary:      Effort: Pulmonary effort is normal.  Genitourinary:    Comments: Foley catheter Musculoskeletal:     Comments: Right hip weakness/pain   Skin:    General: Skin is warm and dry.  Neurological:     Mental Status: He is alert and oriented to person, place, and time.    Imaging: CT Head Wo Contrast  Result Date: 10/20/2021 CLINICAL DATA:  Head neck trauma.  Found on ground.  Hypothermia. EXAM: CT HEAD WITHOUT CONTRAST CT CERVICAL SPINE WITHOUT CONTRAST TECHNIQUE: Multidetector CT imaging of the head and cervical spine was performed following the standard protocol without intravenous contrast. Multiplanar CT image reconstructions of the cervical spine were also generated. COMPARISON:  02/11/2020 head CT FINDINGS: CT HEAD FINDINGS Brain: No evidence of acute infarction, hemorrhage, hydrocephalus, extra-axial collection or mass lesion/mass effect. Remote left occipital and left cerebellar infarcts. Generalized brain atrophy. Vascular: Negative Skull: Normal. Negative for fracture or focal lesion. Sinuses/Orbits: No acute finding. CT CERVICAL SPINE FINDINGS Alignment: No traumatic malalignment Skull base and vertebrae: No acute fracture or aggressive bone lesion. Soft tissues and spinal canal: No prevertebral fluid or swelling. No visible canal hematoma. 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 Prominent adjacent disc and facet degeneration. Upper chest: Biapical pleural based scarring with calcification IMPRESSION: No evidence of acute intracranial or cervical spine injury. Electronically Signed   By: Jorje Guild M.D.   On: 10/20/2021 05:29   CT Cervical Spine Wo Contrast  Result Date: 10/20/2021 CLINICAL DATA:  Head neck trauma.  Found on ground.  Hypothermia. EXAM: CT HEAD WITHOUT CONTRAST CT CERVICAL SPINE WITHOUT CONTRAST TECHNIQUE: Multidetector CT imaging of the head and cervical spine was performed following the standard protocol without intravenous contrast. Multiplanar CT image reconstructions of the  cervical spine were also generated. COMPARISON:  02/11/2020 head CT FINDINGS: CT HEAD FINDINGS Brain: No evidence of acute infarction, hemorrhage, hydrocephalus, extra-axial collection or mass lesion/mass effect. Remote left occipital and left cerebellar infarcts. Generalized brain atrophy. Vascular: Negative Skull: Normal. Negative for fracture or focal lesion. Sinuses/Orbits: No acute finding. CT CERVICAL SPINE FINDINGS Alignment: No traumatic malalignment Skull base and vertebrae: No acute fracture or aggressive bone lesion. Soft tissues and spinal canal: No prevertebral fluid or swelling. No visible canal hematoma. 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 Prominent adjacent disc and facet degeneration. Upper chest: Biapical pleural based scarring with calcification IMPRESSION: No evidence of acute intracranial or cervical spine injury. Electronically Signed   By: Jorje Guild M.D.   On: 10/20/2021 05:29   IR Angiogram Visceral Selective  Result Date: 10/20/2021 INDICATION: 75 year old male status post fall with multiple pelvic fractures and pelvic hematoma with active extravasation. EXAM: 1. Ultrasound-guided vascular access of the left common femoral artery. 2. Catheterization angiography of the right common iliac artery, right external iliac artery, anomalous right obturator artery, and obturator pectineal branch  artery. 3. Coil embolization of obturator pectineal branch artery. 4. 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 MEDICATIONS: None. ANESTHESIA/SEDATION: Moderate (conscious) sedation was employed during this procedure. A total of Versed 2 mg and Fentanyl 100 mcg was administered intravenously. Moderate Sedation Time: 39 minutes. The patient's level of consciousness and vital signs were monitored continuously by radiology nursing throughout the procedure under my direct supervision. FLUOROSCOPY TIME:  Fluoroscopy Time: 8.3 minutes (161 mGy). COMPLICATIONS: None immediate. PROCEDURE:  Informed consent was obtained from the patient following explanation of the procedure, risks, benefits and alternatives. The patient understands, agrees and consents for the procedure. All questions were addressed. A time out was performed prior to the initiation of the procedure. Maximal barrier sterile technique utilized including caps, mask, sterile gowns, sterile gloves, large sterile drape, hand hygiene, and Betadine prep. The left groin was prepped and draped in standard fashion. Preprocedure ultrasound evaluation demonstrated patency of the left common femoral artery. The procedure was planned. Subdermal Local anesthesia was provided with 1% lidocaine. A small skin nick was made. Under direct ultrasound visualization, the left common femoral artery was accessed with a 20 gauge micropuncture needle. A permanent image was captured and stored in the record. A micropuncture set was introduced and a limited left lower extremity angiogram was performed which demonstrated adequate puncture site for closure device use. A J wire was inserted over which the micropuncture set was exchanged for a 5 [place], 10 cm vascular sheath. An Omni Flush catheter was inserted over the wire to the level of the distal abdominal aorta. The wire was removed and exchanged for a Glidewire which was then positioned in the right external iliac artery. The Omni Flush catheter was advanced to the level of the common iliac artery. Angiogram was performed from the right common iliac artery which demonstrated wide patency of the inflow vessels. 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 The Omni Flush catheter was exchanged over a J wire for a 5 French angled tip Kumpe the catheter. The Kumpe catheter was directed to the distal right external iliac  artery. Through the Kumpe the catheter, a Lantern microcatheter and Fathom 16 microwire were inserted in used to select the corona mortis. Angiogram was performed from the proximal corona mortis which demonstrated active extravasation arising from a superior pectineal branch. There is also a larger focus of blushing without evidence of active extravasation just to the right word aspect of the pubic symphysis, presumed prostatic branch. The superior pectineal branch was then catheterized. Repeat angiogram demonstrated persistent active extravasation/pseudoaneurysm. Coil embolization was then performed with 2, 1 x 4 mm low-profile Penumbra coils followed by a single 2 x 4 mm coil. The catheter was retracted slightly and repeat angiogram was performed which demonstrated complete embolization of the superior pectineal branch. The catheter was retracted into the proximal right corona mortis. A Gel-Foam slurry was then administered in until near complete hemostasis. The catheter was flushed and completion angiogram was performed from the proximal right corona mortis without evidence of persistent arterial blushing or active extravasation. The catheters were removed. A 6 French Angio-Seal device was used to close the left common femoral artery arteriotomy site which was deployed successfully. The distal pulses were unchanged. The patient tolerated the procedure well was transferred to the floor in stable condition. IMPRESSION: 1. 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 2. Technically successful coil embolization of the superior pectineal branch artery. 3. Technically successful Gel-Foam embolization of the replaced right obturator artery. Ruthann Cancer, MD [facility] Radiology Electronically Signed   By: Ruthann Cancer M.D.   On:  10/20/2021 16:50   IR Angiogram Pelvis Selective Or Supraselective  Result Date: 10/20/2021 INDICATION: 75 year old male status post fall with multiple pelvic fractures and pelvic hematoma with active extravasation. EXAM: 1. Ultrasound-guided vascular access of the left common femoral artery. 2. Catheterization angiography of the right common iliac artery, right external iliac artery, anomalous right obturator artery, and obturator pectineal branch artery. 3. Coil embolization of obturator pectineal branch artery. 4. 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 MEDICATIONS: None. ANESTHESIA/SEDATION: Moderate (conscious) sedation was employed during this procedure. A total of Versed 2 mg and Fentanyl 100 mcg was administered intravenously. Moderate Sedation Time: 39 minutes. The patient's level of consciousness and vital signs were monitored continuously by radiology nursing throughout the procedure under my direct supervision. FLUOROSCOPY TIME:  Fluoroscopy Time: 8.3 minutes (161 mGy). COMPLICATIONS: None immediate. PROCEDURE: Informed consent was obtained from the patient following explanation of the procedure, risks, benefits and alternatives. The patient understands, agrees and consents for the procedure. All questions were addressed. A time out was performed prior to the initiation of the procedure. Maximal barrier sterile technique utilized including caps, mask, sterile gowns, sterile gloves, large sterile drape, hand hygiene, and Betadine prep. The left groin was prepped and draped in standard fashion. Preprocedure ultrasound evaluation demonstrated patency of the left common femoral artery. The procedure was planned. Subdermal Local anesthesia was provided with 1% lidocaine. A small skin nick was made. Under direct ultrasound visualization, the left common femoral artery was accessed with a 20 gauge micropuncture needle. A permanent image was captured and stored in the record. A  micropuncture set was introduced and a limited left lower extremity angiogram was performed which demonstrated adequate puncture site for closure device use. A J wire was inserted over which the micropuncture set was exchanged for a 5 [place], 10 cm vascular sheath. An Omni Flush catheter was inserted over the wire to the level of the distal abdominal aorta. The wire was removed and exchanged for a Glidewire which was then positioned in the right external iliac artery. The Omni Flush catheter was advanced to the level of the common iliac artery. Angiogram was performed from the right common iliac artery which demonstrated wide patency of the inflow vessels. 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 The Omni Flush catheter was exchanged over a J wire for a 5 French angled tip Kumpe the catheter. The Kumpe catheter was directed to the distal right external iliac artery. Through the Kumpe the catheter, a Lantern microcatheter and Fathom 16 microwire were inserted in used to select the corona mortis. Angiogram was performed from the proximal corona mortis which demonstrated active extravasation arising from a superior pectineal branch. There is also a larger focus of blushing without evidence of active extravasation just to the right word aspect of the pubic symphysis, presumed prostatic branch. The superior pectineal branch was then catheterized. Repeat angiogram demonstrated persistent active extravasation/pseudoaneurysm. Coil embolization  was then performed with 2, 1 x 4 mm low-profile Penumbra coils followed by a single 2 x 4 mm coil. The catheter was retracted slightly and repeat angiogram was performed which demonstrated complete embolization of the superior pectineal branch. The catheter was retracted into the  proximal right corona mortis. A Gel-Foam slurry was then administered in until near complete hemostasis. The catheter was flushed and completion angiogram was performed from the proximal right corona mortis without evidence of persistent arterial blushing or active extravasation. The catheters were removed. A 6 French Angio-Seal device was used to close the left common femoral artery arteriotomy site which was deployed successfully. The distal pulses were unchanged. The patient tolerated the procedure well was transferred to the floor in stable condition. IMPRESSION: 1. 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 2. Technically successful coil embolization of the superior pectineal branch artery. 3. Technically successful Gel-Foam embolization of the replaced right obturator artery. Ruthann Cancer, MD [facility] Radiology Electronically Signed   By: Ruthann Cancer M.D.   On: 10/20/2021 16:50   CT Hip Right Wo Contrast  Result Date: 10/20/2021 CLINICAL DATA:  Hip trauma, fracture suspected, xray done EXAM: CT OF THE RIGHT HIP WITHOUT CONTRAST TECHNIQUE: Multidetector CT imaging of the right hip was performed according to the standard protocol. Multiplanar CT image reconstructions were also generated. COMPARISON:  Same day radiograph FINDINGS: Bones/Joint/Cartilage There are mildly displaced right superior and inferior pubic ramus fractures. There is no evidence of femoral neck fracture. The acetabulum is intact. 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 No focal bone lesion identified. Ligaments Suboptimally assessed by CT. Muscles and Tendons No muscle atrophy. Soft tissues Within the right hemipelvis, there is a high-density 10.9 x 5.8 x 8.3 cm collection and adjacent fluid/stranding. There is a mass effect on the bladder.  IMPRESSION: Mildly displaced right superior and inferior pubis ramus fractures. Large hematoma within the right hemipelvis with adjacent fluid/stranding, measuring 10.9 x 5.8 x 8.3 cm. Electronically Signed   By: Maurine Simmering M.D.   On: 10/20/2021 10:11   IR US Guide Vasc Access Left  Result Date: 10/20/2021 INDICATION: 75 year old male status post fall with multiple pelvic fractures and pelvic hematoma with active extravasation. EXAM: 1. Ultrasound-guided vascular access of the left common femoral artery. 2. Catheterization angiography of the right common iliac artery, right external iliac artery, anomalous right obturator artery, and obturator pectineal branch artery. 3. Coil embolization of obturator pectineal branch artery. 4. 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 MEDICATIONS: None. ANESTHESIA/SEDATION: Moderate (conscious) sedation was employed during this procedure. A total of Versed 2 mg and Fentanyl 100 mcg was administered intravenously. Moderate Sedation Time: 39 minutes. The patient's level of consciousness and vital signs were monitored continuously by radiology nursing throughout the procedure under my direct supervision. FLUOROSCOPY TIME:  Fluoroscopy Time: 8.3 minutes (161 mGy). COMPLICATIONS: None immediate. PROCEDURE: Informed consent was obtained from the patient following explanation of the procedure, risks, benefits and alternatives. The patient understands, agrees and consents for the procedure. All questions were addressed. A time out was performed prior to the initiation of the procedure. Maximal barrier sterile technique utilized including caps, mask, sterile gowns, sterile gloves, large sterile drape, hand hygiene, and Betadine prep. The left groin was prepped and draped in standard fashion. Preprocedure ultrasound evaluation demonstrated patency of the left common femoral artery. The procedure was  planned. Subdermal Local anesthesia was provided with 1%  lidocaine. A small skin nick was made. Under direct ultrasound visualization, the left common femoral artery was accessed with a 20 gauge micropuncture needle. A permanent image was captured and stored in the record. A micropuncture set was introduced and a limited left lower extremity angiogram was performed which demonstrated adequate puncture site for closure device use. A J wire was inserted over which the micropuncture set was exchanged for a 5 [place], 10 cm vascular sheath. An Omni Flush catheter was inserted over the wire to the level of the distal abdominal aorta. The wire was removed and exchanged for a Glidewire which was then positioned in the right external iliac artery. The Omni Flush catheter was advanced to the level of the common iliac artery. Angiogram was performed from the right common iliac artery which demonstrated wide patency of the inflow vessels. 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 The Omni Flush catheter was exchanged over a J wire for a 5 French angled tip Kumpe the catheter. The Kumpe catheter was directed to the distal right external iliac artery. Through the Kumpe the catheter, a Lantern microcatheter and Fathom 16 microwire were inserted in used to select the corona mortis. Angiogram was performed from the proximal corona mortis which demonstrated active extravasation arising from a superior pectineal branch. There is also a larger focus of blushing without evidence of active extravasation just to the right word aspect of the pubic symphysis, presumed prostatic branch. The superior pectineal branch was then catheterized. Repeat angiogram demonstrated persistent active extravasation/pseudoaneurysm. Coil embolization was then performed with 2, 1 x 4 mm low-profile Penumbra coils  followed by a single 2 x 4 mm coil. The catheter was retracted slightly and repeat angiogram was performed which demonstrated complete embolization of the superior pectineal branch. The catheter was retracted into the proximal right corona mortis. A Gel-Foam slurry was then administered in until near complete hemostasis. The catheter was flushed and completion angiogram was performed from the proximal right corona mortis without evidence of persistent arterial blushing or active extravasation. The catheters were removed. A 6 French Angio-Seal device was used to close the left common femoral artery arteriotomy site which was deployed successfully. The distal pulses were unchanged. The patient tolerated the procedure well was transferred to the floor in stable condition. IMPRESSION: 1. 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 2. Technically successful coil embolization of the superior pectineal branch artery. 3. Technically successful Gel-Foam embolization of the replaced right obturator artery. Ruthann Cancer, MD [facility] Radiology Electronically Signed   By: Ruthann Cancer M.D.   On: 10/20/2021 16:50   DG Chest Port 1 View  Result Date: 10/20/2021 CLINICAL DATA:  Sepsis evaluation. EXAM: PORTABLE CHEST 1 VIEW COMPARISON:  Portable chest 09/13/2016 FINDINGS: Cardiac size is normal. Left chest dual lead pacing system with AID wiring is again noted and unchanged. The lungs are mildly emphysematous but clear of infiltrates. There are overlying wires and artifacts. No pleural effusion is seen. Mild osteopenia. IMPRESSION: No active disease.  Stable COPD chest. Electronically Signed   By: Telford Nab M.D.   On: 10/20/2021 06:06   DG Hip Port Unilat  W or Wo Pelvis 1 View Right  Result Date: 10/20/2021 CLINICAL DATA:   75 year old male with history of trauma from a fall. Right-sided hip pain. EXAM: DG HIP (WITH OR WITHOUT PELVIS) 1V PORT RIGHT COMPARISON:  No priors. FINDINGS: Three views of the bony pelvis in the right hip demonstrate no definite acute displaced fracture, subluxation or dislocation. There is joint space narrowing, subchondral sclerosis, subchondral cyst formation and osteophyte formation in the hip joints bilaterally, indicative of moderate to severe osteoarthritis. IMPRESSION: 1. No acute radiographic abnormality of the bony pelvis or the right hip. 2. 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 Electronically Signed   By: Vinnie Langton M.D.   On: 10/20/2021 06:06   IR EMBO ART  VEN HEMORR LYMPH EXTRAV  INC GUIDE ROADMAPPING  Result Date: 10/20/2021 INDICATION: 75 year old male status post fall with multiple pelvic fractures and pelvic hematoma with active extravasation. EXAM: 1. Ultrasound-guided vascular access of the left common femoral artery. 2. Catheterization angiography of the right common iliac artery, right external iliac artery, anomalous right obturator artery, and obturator pectineal branch artery. 3. Coil embolization of obturator pectineal branch artery. 4. 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 MEDICATIONS: None. ANESTHESIA/SEDATION: Moderate (conscious) sedation was employed during this procedure. A total of Versed 2 mg and Fentanyl 100 mcg was administered intravenously. Moderate Sedation Time: 39 minutes. The patient's level of consciousness and vital signs were monitored continuously by radiology nursing throughout the procedure under my direct supervision. FLUOROSCOPY TIME:  Fluoroscopy Time: 8.3 minutes (161 mGy). COMPLICATIONS: None immediate. PROCEDURE: Informed consent was obtained from the patient following explanation of the procedure, risks, benefits and alternatives. The patient understands, agrees and consents for the procedure. All  questions were addressed. A time out was performed prior to the initiation of the procedure. Maximal barrier sterile technique utilized including caps, mask, sterile gowns, sterile gloves, large sterile drape, hand hygiene, and Betadine prep. The left groin was prepped and draped in standard fashion. Preprocedure ultrasound evaluation demonstrated patency of the left common femoral artery. The procedure was planned. Subdermal Local anesthesia was provided with 1% lidocaine. A small skin nick was made. Under direct ultrasound visualization, the left common femoral artery was accessed with a 20 gauge micropuncture needle. A permanent image was captured and stored in the record. A micropuncture set was introduced and a limited left lower extremity angiogram was performed which demonstrated adequate puncture site for closure device use. A J wire was inserted over which the micropuncture set was exchanged for a 5 [place], 10 cm vascular sheath. An Omni Flush catheter was inserted over the wire to the level of the distal abdominal aorta. The wire was removed and exchanged for a Glidewire which was then positioned in the right external iliac artery. The Omni Flush catheter was advanced to the level of the common iliac artery. Angiogram was performed from the right common iliac artery which demonstrated wide patency of the inflow vessels. 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 The Omni Flush catheter was exchanged over a J wire for a 5 French angled tip Kumpe the catheter. The Kumpe catheter was directed to the distal right external iliac artery. Through the Kumpe the catheter, a Lantern microcatheter and Fathom 16 microwire were inserted in used to select the corona mortis. Angiogram was performed from  the proximal corona  mortis which demonstrated active extravasation arising from a superior pectineal branch. There is also a larger focus of blushing without evidence of active extravasation just to the right word aspect of the pubic symphysis, presumed prostatic branch. The superior pectineal branch was then catheterized. Repeat angiogram demonstrated persistent active extravasation/pseudoaneurysm. Coil embolization was then performed with 2, 1 x 4 mm low-profile Penumbra coils followed by a single 2 x 4 mm coil. The catheter was retracted slightly and repeat angiogram was performed which demonstrated complete embolization of the superior pectineal branch. The catheter was retracted into the proximal right corona mortis. A Gel-Foam slurry was then administered in until near complete hemostasis. The catheter was flushed and completion angiogram was performed from the proximal right corona mortis without evidence of persistent arterial blushing or active extravasation. The catheters were removed. A 6 French Angio-Seal device was used to close the left common femoral artery arteriotomy site which was deployed successfully. The distal pulses were unchanged. The patient tolerated the procedure well was transferred to the floor in stable condition. IMPRESSION: 1. 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 2. Technically successful coil embolization of the superior pectineal branch artery. 3. Technically successful Gel-Foam embolization of the replaced right obturator artery. Ruthann Cancer, MD [facility] Electronically Signed   By: Ruthann Cancer M.D.   On: 10/20/2021 16:50   CT Angio Abd/Pel w/ and/or w/o  Result Date: 10/20/2021 CLINICAL DATA:  Right pubic rami fractures. Hematoma in the right hemipelvis. Evaluate for ongoing bleeding. EXAM:  CT ANGIOGRAPHY ABDOMEN AND PELVIS WITH CONTRAST TECHNIQUE: Multidetector CT imaging of the abdomen and pelvis was performed using the standard protocol during bolus administration of intravenous contrast. Multiplanar reconstructed images and MIPs were obtained and reviewed to evaluate the vascular anatomy. CONTRAST:  133mL OMNIPAQUE IOHEXOL 350 MG/ML SOLN COMPARISON:  CT right hip 10/20/2021 FINDINGS: VASCULAR Aorta: Normal caliber aorta without aneurysm, dissection, vasculitis or significant stenosis. Celiac: Patent without evidence of aneurysm, dissection, vasculitis or significant stenosis. SMA: Patent without evidence of aneurysm, dissection, vasculitis or significant stenosis. Renals: Both renal arteries are patent without evidence of aneurysm, dissection, vasculitis, fibromuscular dysplasia or significant stenosis. IMA: Patent without evidence of aneurysm, dissection, vasculitis or significant stenosis. Inflow: Patent without evidence of aneurysm, dissection, vasculitis or significant stenosis. Proximal Outflow: Proximal femoral arteries are patent bilaterally. There is a coronal mortis vascular variant originating from the distal right external iliac artery. Coronal mortis branches appear to be supplying areas of extravasation along the right side of the prostate on sequence 5 image 220 and near the right superior pubic ramus on sequence 5, image 208. These areas of contrast extravasation enlarge on the venous images. Veins: Portal venous system is patent. Hepatic veins are patent. Renal veins are patent. Limited evaluation of the iliac veins due to the timing of the study. Review of the MIP images confirms the above findings. NON-VASCULAR Lower chest: Small nodules in the left lower lobe and some could be calcified. Largest nodule measures approximately 3 mm. Dependent changes in the lower lungs. No pleural effusions. Patient has a of ICD. Hepatobiliary: Normal appearance of the liver. No biliary dilatation.  Small calcified gallstone at the gallbladder base without distention or inflammatory changes. Pancreas: Unremarkable. No pancreatic ductal dilatation or surrounding inflammatory changes. Spleen: Normal in size without focal abnormality. Adrenals/Urinary Tract: Normal adrenal glands.  1.8 cm exophytic left renal cyst. No suspicious renal lesions. Probable small cyst in the right kidney upper pole. Negative for hydronephrosis. Urinary bladder is displaced towards the left from the right hemipelvic hematoma. Stomach/Bowel: Stomach is within normal limits. Appendix appears normal. No evidence of bowel wall thickening, distention, or inflammatory changes. Lymphatic: No lymph node enlargement in the abdomen or pelvis. Reproductive: Calcifications in the prostate. Small focus contrast extravasation along the right anterior aspect of the prostate. Seminal vesicles are slightly displaced towards the left. Other: There is a large right-sided pelvic hematoma that measures 8.2 x 6.0 x 5.9 cm. Small focus of bleeding within this large pelvic hematoma. Evidence for contrast accumulating along the right anterior aspect of the prostate compatible with contrast extravasation and bleeding. There is additional blood in the posterior right hemipelvis and there is blood tracking up along the anterior lower abdomen and into the right paracolic gutter region. Musculoskeletal: Minimally displaced right superior pubic ramus fracture. Displaced right inferior pubic ramus fracture. Fracture of the right inferior sacrum, best seen on sequence 3, image 848. Left pubic rami are intact. Both hips are located. Minimal retrolisthesis of L4 on L5. Disc space narrowing at L3-L4 and L4-L5. IMPRESSION: VASCULAR 1. Two foci of active bleeding in the right hemipelvis. Largest focus of bleeding is along the right anterior aspect of the prostate. Smaller focus of bleeding is near the right superior pubic ramus. These areas of bleeding appear to be supplied  by a corona mortis branch that originates from the distal right external iliac artery. NON-VASCULAR 1. Fractures of the right superior and right inferior pubic ramus. Right sacral fracture. Large right hemipelvic hematoma with blood extending into the lower abdomen. Bladder and seminal vesicles are displaced from the hematoma. 2. Small nonspecific nodules in left lung base, some of these could be calcified. Largest nodule measures 3 mm. No follow-up needed if patient is low-risk (and has no known or suspected primary neoplasm). Non-contrast chest CT can be considered in 12 months if patient is high-risk. This recommendation follows the consensus statement: Guidelines for Management of Incidental Pulmonary Nodules Detected on CT Images: From the Fleischner Society 2017; Radiology 2017; 284:228-243. 3. Cholelithiasis. No evidence for gallbladder distention or inflammation. These results were called by telephone at the time of interpretation on 10/20/2021 at 12:20 pm to provider Wynona Dove , who verbally acknowledged these results. Electronically Signed   By: Markus Daft M.D.   On: 10/20/2021 13:19    Labs:  CBC: Recent Labs    03/13/21 1005 10/20/21 0357 10/20/21 1939 10/21/21 0156  WBC 6.5 16.8* 7.9 8.6  HGB 16.5 14.2 11.4* 10.6*  HCT 49.0 43.2 35.1* 32.8*  PLT 142.0* 135* 104* 103*    COAGS: Recent Labs    10/20/21 0357  INR 1.1  APTT 25    BMP: Recent Labs    08/10/21 1613 10/20/21 0357 10/20/21 1938 10/21/21 0156  NA 140 138 138 135  K 4.1 3.9 4.4 4.6  CL 106 107 107 106  CO2 27 21* 25 25  GLUCOSE 190* 209* 96 166*  BUN 10 15 14 14   CALCIUM 9.1 8.1* 8.4* 8.3*  CREATININE 1.14 1.32* 1.28* 1.25*  GFRNONAA  --  56* 58* >60    LIVER FUNCTION TESTS: Recent Labs    03/13/21 1005 10/20/21 1938  BILITOT 0.8 1.0  AST 15 19  ALT 6 11  ALKPHOS 95 66  PROT 7.2 5.2*  ALBUMIN 4.4 2.9*    Assessment and Plan:  Pelvic bleeding s/p pelvic angiogram with coil embolization of  the right pectineal branch artery and gelfoam embolization of replaced right obturator/corona mortis artery 10/20/21 via left CFA access with 6 Fr angioseal closure.   Left groin vascular access site is clean, soft, dry and non-tender; no evidence of hematoma or pseudoaneurysm. Hemoglobin level is slightly decreased compared to yesterday's values - 10.6 (11.4 yesterday). His vital signs are within an acceptable range.   IR recommends to continue to monitor hemoglobin levels. May remove left groin dressing tomorrow.   Please call IR with any questions or if IR can be of further assistance.   Electronically Signed: Soyla Dryer, AGACNP-BC [phone] 10/21/2021, 4:10 PM   I spent a total of 15 Minutes at the the patient's bedside AND on the patient's hospital floor or unit, greater than 50% of which was counseling/coordinating care for pelvic angiogram with embolization

## 2021-10-21 NOTE — Progress Notes (Signed)
Inpatient Rehab Admissions Coordinator:   Per PT recommendations, pt was screened for CIR appropriateness by Shann Medal, PT, DPT.  Noted pt is observation status at this time. Pt may not have the medical necessity to warrant an inpatient rehab stay if they remain observation. If pt were to qualify for inpatient status, AC will screen for candidacy.    Shann Medal, PT, DPT Admissions Coordinator [phone] 10/21/21  2:46 PM

## 2021-10-21 NOTE — Care Management (Addendum)
Updated [facility]. Confirmation number [id number]

## 2021-10-21 NOTE — Care Management Obs Status (Signed)
[facility] NOTIFICATION   Patient Details  Name: KAIUS DAINO MRN: [id number] Date of Birth: 06-Jan-1946   Medicare Observation Status Notification Given:  Yes    Carles Collet, RN 10/21/2021, 2:11 PM

## 2021-10-21 NOTE — TOC CAGE-AID Note (Signed)
Transition of Care [facility]) - CAGE-AID Screening   Patient Details  Name: Kevin [name] MRN: [id number] Date of Birth: [birth year]  Transition of Care [place]) CM/SW Contact:    Bethann Berkshire, [place] Phone Number: 10/21/2021, 9:26 AM   Clinical Narrative:  CAGE-AID Completed; score of 0. Pt states he rarely drinks alcohol and does not use any other substances; last drink over 1 month ago.   CAGE-AID Screening:    Have You Ever Felt You Ought to Cut Down on Your Drinking or Drug Use?: No Have People Annoyed You By Critizing Your Drinking Or Drug Use?: No Have You Felt Bad Or Guilty About Your Drinking Or Drug Use?: No Have You Ever Had a Drink or Used Drugs First Thing In The Morning to Steady Your Nerves or to Get Rid of a Hangover?: No CAGE-AID Score: 0  Substance Abuse Education Offered: No

## 2021-10-21 NOTE — Progress Notes (Addendum)
PROGRESS NOTE    Kevin [name]  VOH:[id number] DOB: 06/25/1946 DOA: 10/20/2021 PCP: [name], MD    Chief Complaint  Patient presents with   [name] Exposure    Brief Narrative:    Kevin [name] is a 75 y.o. 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 Gelfoam embolization of the replaced right obturator/corona mortis artery.  - pain control and therapy eval recommending CIR.    Chronic combined systolic and diastolic heart failure:  He appears to be stable/ Euvolemic.    Acute blood loss anemia post op/ Hematoma: -drop in hemoglobin 14 to 10.6.  - transfuse to keep hemoglobin greater than 8.     Hypertension:  Optimal BP parameters.    Type 2 DM, with hyperglycemia: CBG (last 3)  Recent Labs    10/20/21 2221 10/21/21 0623 10/21/21 1214  GLUCAP 172* 115* 177*   Resume SSI. Hemoglobin A1c is pending.     Vascular dementia:  Resume aricept and namenda.     Hyperlipidemia:  Resume statin.   Stage 3 a CKD:  Creatinine stable around 1.2  Urinary retention:  S/p foley catheter placement.     DVT prophylaxis: scd's Code Status: DNR Family Communication: family at bedside Disposition:   Status is: Observation  The patient will require care spanning > 2  midnights and should be moved to inpatient because: CIR       Consultants:  IR  Procedures:   Pelvic angiogram, coil embolization of right pectineal branch artery. Gelfoam embolization of the replaced right obturator/corona mortis artery.  Antimicrobials: none.   Subjective: No hip pain.   Objective: Vitals:   10/20/21 2309 10/21/21 0424 10/21/21 0840 10/21/21 1216  BP: 126/62 124/72 120/65 127/70  Pulse: 90 82 79 78  Resp: 16 18 16 18   Temp: 98.2 F (36.8 C) 98.5 F (36.9 C) 98.2 F (36.8 C) 98 F (36.7 C)  TempSrc: Oral Oral Oral Oral  SpO2: 93% 93% 97% 95%  Weight:  76.9 kg    Height:        Intake/Output Summary (Last 24 hours) at 10/21/2021 1345 Last data filed at 10/21/2021 1314 Gross per 24 hour  Intake 240 ml  Output 1950 ml  Net -1710 ml   Filed Weights   10/20/21 0353 10/20/21 2123 10/21/21 0424  Weight: 72.6 kg 74 kg 76.9 kg    Examination:  General exam: Appears calm and comfortable  Respiratory system: Clear to auscultation. Respiratory effort normal. Cardiovascular system: S1 & S2 heard, RRR. No JVD,  No pedal edema. Gastrointestinal system: Abdomen is nondistended, soft and nontender.  Normal bowel sounds heard. Central nervous system: Alert and oriented. No focal neurological deficits. Extremities: Symmetric 5 x 5 power. Skin: No rashes, lesions or ulcers Psychiatry: Mood & affect appropriate.     Data Reviewed: I have  personally reviewed following labs and imaging studies  CBC: Recent Labs  Lab 10/20/21 0357 10/20/21 1939 10/21/21 0156  WBC 16.8* 7.9 8.6  NEUTROABS 14.4*  --   --   HGB 14.2 11.4* 10.6*  HCT 43.2 35.1* 32.8*  MCV 102.6* 102.6* 101.5*  PLT 135* 104* 103*    Basic Metabolic Panel: Recent Labs  Lab 10/20/21 0357 10/20/21 1938 10/21/21 0156  NA 138 138 135  K 3.9 4.4 4.6  CL 107 107 106  CO2 21* 25 25  GLUCOSE 209* 96 166*  BUN 15 14 14   CREATININE 1.32* 1.28* 1.25*  CALCIUM 8.1* 8.4* 8.3*     GFR: Estimated Creatinine Clearance: 55.5 mL/min (A) (by C-G formula based on SCr of 1.25 mg/dL (H)).  Liver Function Tests: Recent Labs  Lab 10/20/21 1938  AST 19  ALT 11  ALKPHOS 66  BILITOT 1.0  PROT 5.2*  ALBUMIN 2.9*    CBG: Recent Labs  Lab 10/20/21 2016 10/20/21 2221 10/21/21 0623 10/21/21 1214  GLUCAP 87 172* 115* 177*     Recent Results (from the past 240 hour(s))  Blood culture (routine x 2)     Status: None (Preliminary result)   Collection Time: 10/20/21  5:44 AM   Specimen: BLOOD  Result Value Ref Range Status   Specimen Description BLOOD RIGHT ANTECUBITAL  Final   Special Requests   Final    BOTTLES DRAWN AEROBIC AND ANAEROBIC Blood Culture adequate volume   Culture   Final    NO GROWTH 1 DAY Performed at [facility], [place] [address], [place], [place] [address]    Report Status PENDING  Incomplete  Blood culture (routine x 2)     Status: None (Preliminary result)   Collection Time: 10/20/21  5:49 AM   Specimen: BLOOD LEFT HAND  Result Value Ref Range Status   Specimen Description BLOOD LEFT HAND  Final   Special Requests   Final    BOTTLES DRAWN AEROBIC AND ANAEROBIC Blood Culture adequate volume   Culture   Final    NO GROWTH 1 DAY Performed at [facility], [place] [address], [place], [place] [address]    Report Status PENDING  Incomplete  Urine Culture     Status: None   Collection Time: 10/20/21  6:27 AM   Specimen: In/Out Cath Urine  Result Value Ref Range Status   Specimen Description IN/OUT CATH URINE  Final   Special Requests NONE  Final   Culture   Final    NO GROWTH Performed at [facility], [place] [address], [place], [place] [address]    Report Status 10/21/2021 FINAL  Final  Blood Culture (routine x 2)     Status: None (Preliminary result)   Collection Time: 10/20/21  7:36 PM   Specimen: BLOOD  Result Value Ref Range Status   Specimen Description BLOOD RIGHT ANTECUBITAL  Final   Special Requests   Final     BOTTLES DRAWN AEROBIC AND ANAEROBIC Blood Culture results may not be optimal due to an excessive volume of blood received in culture bottles   Culture   Final    NO GROWTH < 12 HOURS Performed at [facility], [place] [address], [place], [place] [address]    Report Status PENDING  Incomplete  Blood Culture (routine x 2)     Status: None (Preliminary result)   Collection Time: 10/20/21  7:39 PM   Specimen: BLOOD  Result Value Ref Range  Status   Specimen Description BLOOD LEFT ANTECUBITAL  Final   Special Requests   Final    BOTTLES DRAWN AEROBIC AND ANAEROBIC Blood Culture adequate volume   Culture   Final    NO GROWTH < 12 HOURS Performed at [facility], [address] [place], [place] [address]    Report Status PENDING  Incomplete  Resp Panel by RT-PCR (Flu A&B, Covid) Nasopharyngeal Swab     Status: None   Collection Time: 10/21/21 12:26 AM   Specimen: Nasopharyngeal Swab; Nasopharyngeal(NP) swabs in vial transport medium  Result Value Ref Range Status   SARS Coronavirus 2 by RT PCR NEGATIVE NEGATIVE Final    Comment: (NOTE) SARS-CoV-2 target nucleic acids are NOT DETECTED.  The SARS-CoV-2 RNA is generally detectable in upper respiratory specimens during the acute phase of infection. The lowest concentration of SARS-CoV-2 viral copies this assay can detect is 138 copies/mL. A negative result does not preclude SARS-Cov-2 infection and should not be used as the sole basis for treatment or other patient management decisions. A negative result may occur with  improper specimen collection/handling, submission of specimen other than nasopharyngeal swab, presence of viral mutation(s) within the areas targeted by this assay, and inadequate number of viral copies(<138 copies/mL). A negative result must be combined with clinical observations, patient history, and epidemiological information. The expected result is Negative.  Fact Sheet for Patients:   EntrepreneurPulse.com.au  Fact Sheet for Healthcare Providers:  IncredibleEmployment.be  This test is no t yet approved or cleared by the [place] FDA and  has been authorized for detection and/or diagnosis of SARS-CoV-2 by FDA under an Emergency Use Authorization (EUA). This EUA will remain  in effect (meaning this test can be used) for the duration of the COVID-19 declaration under Section 564(b)(1) of the Act, 21 U.S.C.section 360bbb-3(b)(1), unless the authorization is terminated  or revoked sooner.       Influenza A by PCR NEGATIVE NEGATIVE Final   Influenza B by PCR NEGATIVE NEGATIVE Final    Comment: (NOTE) The Xpert Xpress SARS-CoV-2/FLU/RSV plus assay is intended as an aid in the diagnosis of influenza from Nasopharyngeal swab specimens and should not be used as a sole basis for treatment. Nasal washings and aspirates are unacceptable for Xpert Xpress SARS-CoV-2/FLU/RSV testing.  Fact Sheet for Patients: EntrepreneurPulse.com.au  Fact Sheet for Healthcare Providers: IncredibleEmployment.be  This test is not yet approved or cleared by the [place] FDA and has been authorized for detection and/or diagnosis of SARS-CoV-2 by FDA under an Emergency Use Authorization (EUA). This EUA will remain in effect (meaning this test can be used) for the duration of the COVID-19 declaration under Section 564(b)(1) of the Act, 21 U.S.C. section 360bbb-3(b)(1), unless the authorization is terminated or revoked.  Performed at [facility], [place] [address], [place], [place] [address]          Radiology Studies: CT Head Wo Contrast  Result Date: 10/20/2021 CLINICAL DATA:  Head neck trauma.  Found on ground.  Hypothermia. EXAM: CT HEAD WITHOUT CONTRAST CT CERVICAL SPINE WITHOUT CONTRAST TECHNIQUE: Multidetector CT imaging of the head and cervical spine was performed following the standard  protocol without intravenous contrast. Multiplanar CT image reconstructions of the cervical spine were also generated. COMPARISON:  02/11/2020 head CT FINDINGS: CT HEAD FINDINGS Brain: No evidence of acute infarction, hemorrhage, hydrocephalus, extra-axial collection or mass lesion/mass effect. Remote left occipital and left cerebellar infarcts. Generalized brain atrophy. Vascular: Negative Skull: Normal. Negative for fracture  or focal lesion. Sinuses/Orbits: No acute finding. CT CERVICAL SPINE FINDINGS Alignment: No traumatic malalignment Skull base and vertebrae: No acute fracture or aggressive bone lesion. Soft tissues and spinal canal: No prevertebral fluid or swelling. No visible canal hematoma. 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 Prominent adjacent disc and facet degeneration. Upper chest: Biapical pleural based scarring with calcification IMPRESSION: No evidence of acute intracranial or cervical spine injury. Electronically Signed   By: Jorje Guild M.D.   On: 10/20/2021 05:29   CT Cervical Spine Wo Contrast  Result Date: 10/20/2021 CLINICAL DATA:  Head neck trauma.  Found on ground.  Hypothermia. EXAM: CT HEAD WITHOUT CONTRAST CT CERVICAL SPINE WITHOUT CONTRAST TECHNIQUE: Multidetector CT imaging of the head and cervical spine was performed following the standard protocol without intravenous contrast. Multiplanar CT image reconstructions of the cervical spine were also generated. COMPARISON:  02/11/2020 head CT FINDINGS: CT HEAD FINDINGS Brain: No evidence of acute infarction, hemorrhage, hydrocephalus, extra-axial collection or mass lesion/mass effect. Remote left occipital and left cerebellar infarcts. Generalized brain atrophy. Vascular: Negative Skull: Normal. Negative for fracture or focal lesion. Sinuses/Orbits: No acute finding. CT CERVICAL SPINE FINDINGS Alignment: No traumatic malalignment Skull base and vertebrae: No acute fracture or aggressive bone lesion. Soft tissues and spinal canal: No  prevertebral fluid or swelling. No visible canal hematoma. 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 Prominent adjacent disc and facet degeneration. Upper chest: Biapical pleural based scarring with calcification IMPRESSION: No evidence of acute intracranial or cervical spine injury. Electronically Signed   By: Jorje Guild M.D.   On: 10/20/2021 05:29   IR Angiogram Visceral Selective  Result Date: 10/20/2021 INDICATION: 75 year old male status post fall with multiple pelvic fractures and pelvic hematoma with active extravasation. EXAM: 1. Ultrasound-guided vascular access of the left common femoral artery. 2. Catheterization angiography of the right common iliac artery, right external iliac artery, anomalous right obturator artery, and obturator pectineal branch artery. 3. Coil embolization of obturator pectineal branch artery. 4. 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 MEDICATIONS: None. ANESTHESIA/SEDATION: Moderate (conscious) sedation was employed during this procedure. A total of Versed 2 mg and Fentanyl 100 mcg was administered intravenously. Moderate Sedation Time: 39 minutes. The patient's level of consciousness and vital signs were monitored continuously by radiology nursing throughout the procedure under my direct supervision. FLUOROSCOPY TIME:  Fluoroscopy Time: 8.3 minutes (161 mGy). COMPLICATIONS: None immediate. PROCEDURE: Informed consent was obtained from the patient following explanation of the procedure, risks, benefits and alternatives. The patient understands, agrees and consents for the procedure. All questions were addressed. A time out was performed prior to the initiation of the procedure. Maximal barrier sterile technique utilized including caps, mask, sterile gowns, sterile gloves, large sterile drape, hand hygiene, and Betadine prep. The left groin was prepped and draped in standard fashion. Preprocedure ultrasound evaluation demonstrated patency of the left common  femoral artery. The procedure was planned. Subdermal Local anesthesia was provided with 1% lidocaine. A small skin nick was made. Under direct ultrasound visualization, the left common femoral artery was accessed with a 20 gauge micropuncture needle. A permanent image was captured and stored in the record. A micropuncture set was introduced and a limited left lower extremity angiogram was performed which demonstrated adequate puncture site for closure device use. A J wire was inserted over which the micropuncture set was exchanged for a 5 [place], 10 cm vascular sheath. An Omni Flush catheter was inserted over the wire to the level of the distal abdominal aorta. The wire was removed and  exchanged for a Glidewire which was then positioned in the right external iliac artery. The Omni Flush catheter was advanced to the level of the common iliac artery. Angiogram was performed from the right common iliac artery which demonstrated wide patency of the inflow vessels. 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 The Omni Flush catheter was exchanged over a J wire for a 5 French angled tip Kumpe the catheter. The Kumpe catheter was directed to the distal right external iliac artery. Through the Kumpe the catheter, a Lantern microcatheter and Fathom 16 microwire were inserted in used to select the corona mortis. Angiogram was performed from the proximal corona mortis which demonstrated active extravasation arising from a superior pectineal branch. There is also a larger focus of blushing without evidence of active extravasation just to the right word aspect of the pubic symphysis, presumed prostatic branch. The superior pectineal branch was then catheterized. Repeat angiogram demonstrated persistent active  extravasation/pseudoaneurysm. Coil embolization was then performed with 2, 1 x 4 mm low-profile Penumbra coils followed by a single 2 x 4 mm coil. The catheter was retracted slightly and repeat angiogram was performed which demonstrated complete embolization of the superior pectineal branch. The catheter was retracted into the proximal right corona mortis. A Gel-Foam slurry was then administered in until near complete hemostasis. The catheter was flushed and completion angiogram was performed from the proximal right corona mortis without evidence of persistent arterial blushing or active extravasation. The catheters were removed. A 6 French Angio-Seal device was used to close the left common femoral artery arteriotomy site which was deployed successfully. The distal pulses were unchanged. The patient tolerated the procedure well was transferred to the floor in stable condition. IMPRESSION: 1. 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 2. Technically successful coil embolization of the superior pectineal branch artery. 3. Technically successful Gel-Foam embolization of the replaced right obturator artery. Ruthann Cancer, MD [facility] Electronically Signed   By: Ruthann Cancer M.D.   On: 10/20/2021 16:50   IR Angiogram Pelvis Selective Or Supraselective  Result Date: 10/20/2021 INDICATION: 75 year old male status post fall with multiple pelvic fractures and pelvic hematoma with active extravasation. EXAM: 1. Ultrasound-guided vascular access of the left common femoral artery. 2. Catheterization angiography of the right common iliac artery, right external iliac artery, anomalous right obturator artery, and obturator pectineal branch artery. 3. Coil embolization of obturator pectineal branch artery. 4. Gelfoam  embolization of the anomalous right obturator artery. MEDICATIONS: None. ANESTHESIA/SEDATION: Moderate (conscious) sedation was employed during this procedure. A total of Versed 2 mg and Fentanyl 100 mcg was administered intravenously. Moderate Sedation Time: 39 minutes. The patient's level of consciousness and vital signs were monitored continuously by radiology nursing throughout the procedure under my direct supervision. FLUOROSCOPY TIME:  Fluoroscopy Time: 8.3 minutes (161 mGy). COMPLICATIONS: None immediate. PROCEDURE: Informed consent was obtained from the patient following explanation of the procedure, risks, benefits and alternatives. The patient understands, agrees and consents for the procedure. All questions were addressed. A time out was performed prior to the initiation of the procedure. Maximal barrier sterile technique utilized including caps, mask, sterile gowns, sterile gloves, large sterile drape, hand hygiene, and Betadine prep.  The left groin was prepped and draped in standard fashion. Preprocedure ultrasound evaluation demonstrated patency of the left common femoral artery. The procedure was planned. Subdermal Local anesthesia was provided with 1% lidocaine. A small skin nick was made. Under direct ultrasound visualization, the left common femoral artery was accessed with a 20 gauge micropuncture needle. A permanent image was captured and stored in the record. A micropuncture set was introduced and a limited left lower extremity angiogram was performed which demonstrated adequate puncture site for closure device use. A J wire was inserted over which the micropuncture set was exchanged for a 5 [place], 10 cm vascular sheath. An Omni Flush catheter was inserted over the wire to the level of the distal abdominal aorta. The wire was removed and exchanged for a Glidewire which was then positioned in the right external iliac artery. The Omni Flush catheter was advanced to the level of the common iliac  artery. Angiogram was performed from the right common iliac artery which demonstrated wide patency of the inflow vessels. 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 The Omni Flush catheter was exchanged over a J wire for a 5 French angled tip Kumpe the catheter. The Kumpe catheter was directed to the distal right external iliac artery. Through the Kumpe the catheter, a Lantern microcatheter and Fathom 16 microwire were inserted in used to select the corona mortis. Angiogram was performed from the proximal corona mortis which demonstrated active extravasation arising from a superior pectineal branch. There is also a larger focus of blushing without evidence of active extravasation just to the right word aspect of the pubic symphysis, presumed prostatic branch. The superior pectineal branch was then catheterized. Repeat angiogram demonstrated persistent active extravasation/pseudoaneurysm. Coil embolization was then performed with 2, 1 x 4 mm low-profile Penumbra coils followed by a single 2 x 4 mm coil. The catheter was retracted slightly and repeat angiogram was performed which demonstrated complete embolization of the superior pectineal branch. The catheter was retracted into the proximal right corona mortis. A Gel-Foam slurry was then administered in until near complete hemostasis. The catheter was flushed and completion angiogram was performed from the proximal right corona mortis without evidence of persistent arterial blushing or active extravasation. The catheters were removed. A 6 French Angio-Seal device was used to close the left common femoral artery arteriotomy site which was deployed successfully. The distal pulses were unchanged. The patient tolerated the procedure well was transferred to the floor in  stable condition. IMPRESSION: 1. 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 2. Technically successful coil embolization of the superior pectineal branch artery. 3. Technically successful Gel-Foam embolization of the replaced right obturator artery. Ruthann Cancer, MD [facility] Electronically Signed   By: Ruthann Cancer M.D.   On: 10/20/2021 16:50   CT Hip Right Wo Contrast  Result Date: 10/20/2021 CLINICAL DATA:  Hip trauma, fracture suspected, xray done EXAM: CT OF THE RIGHT HIP WITHOUT CONTRAST TECHNIQUE: Multidetector CT imaging of the right hip was performed according to the standard protocol. Multiplanar CT image reconstructions were also generated. COMPARISON:  Same day radiograph FINDINGS: Bones/Joint/Cartilage There are mildly displaced right superior and inferior pubic ramus fractures. There is no  evidence of femoral neck fracture. The acetabulum is intact. 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 No focal bone lesion identified. Ligaments Suboptimally assessed by CT. Muscles and Tendons No muscle atrophy. Soft tissues Within the right hemipelvis, there is a high-density 10.9 x 5.8 x 8.3 cm collection and adjacent fluid/stranding. There is a mass effect on the bladder. IMPRESSION: Mildly displaced right superior and inferior pubis ramus fractures. Large hematoma within the right hemipelvis with adjacent fluid/stranding, measuring 10.9 x 5.8 x 8.3 cm. Electronically Signed   By: Maurine Simmering M.D.   On: 10/20/2021 10:11   IR US Guide Vasc Access Left  Result Date: 10/20/2021 INDICATION: 75 year old male status post fall with multiple pelvic fractures and pelvic hematoma with active extravasation. EXAM: 1. Ultrasound-guided vascular access of the left common femoral artery. 2. Catheterization angiography  of the right common iliac artery, right external iliac artery, anomalous right obturator artery, and obturator pectineal branch artery. 3. Coil embolization of obturator pectineal branch artery. 4. 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 MEDICATIONS: None. ANESTHESIA/SEDATION: Moderate (conscious) sedation was employed during this procedure. A total of Versed 2 mg and Fentanyl 100 mcg was administered intravenously. Moderate Sedation Time: 39 minutes. The patient's level of consciousness and vital signs were monitored continuously by radiology nursing throughout the procedure under my direct supervision. FLUOROSCOPY TIME:  Fluoroscopy Time: 8.3 minutes (161 mGy). COMPLICATIONS: None immediate. PROCEDURE: Informed consent was obtained from the patient following explanation of the procedure, risks, benefits and alternatives. The patient understands, agrees and consents for the procedure. All questions were addressed. A time out was performed prior to the initiation of the procedure. Maximal barrier sterile technique utilized including caps, mask, sterile gowns, sterile gloves, large sterile drape, hand hygiene, and Betadine prep. The left groin was prepped and draped in standard fashion. Preprocedure ultrasound evaluation demonstrated patency of the left common femoral artery. The procedure was planned. Subdermal Local anesthesia was provided with 1% lidocaine. A small skin nick was made. Under direct ultrasound visualization, the left common femoral artery was accessed with a 20 gauge micropuncture needle. A permanent image was captured and stored in the record. A micropuncture set was introduced and a limited left lower extremity angiogram was performed which demonstrated adequate puncture site for closure device use. A J wire was inserted over which the micropuncture set was exchanged for a 5 [place], 10 cm vascular sheath. An Omni Flush catheter was inserted over the wire to the level of the  distal abdominal aorta. The wire was removed and exchanged for a Glidewire which was then positioned in the right external iliac artery. The Omni Flush catheter was advanced to the level of the common iliac artery. Angiogram was performed from the right common iliac artery which demonstrated wide patency of the inflow vessels. 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 The Omni Flush catheter was exchanged over a J wire for a 5 French angled tip Kumpe the catheter. The Kumpe catheter was directed to the distal right external iliac artery. Through the Kumpe the catheter, a Lantern microcatheter and Fathom 16 microwire were inserted in used to select the corona mortis. Angiogram was performed from the proximal corona mortis which demonstrated active extravasation arising from a superior pectineal branch. There is also a larger focus of blushing without evidence of active extravasation  just to the right word aspect of the pubic symphysis, presumed prostatic branch. The superior pectineal branch was then catheterized. Repeat angiogram demonstrated persistent active extravasation/pseudoaneurysm. Coil embolization was then performed with 2, 1 x 4 mm low-profile Penumbra coils followed by a single 2 x 4 mm coil. The catheter was retracted slightly and repeat angiogram was performed which demonstrated complete embolization of the superior pectineal branch. The catheter was retracted into the proximal right corona mortis. A Gel-Foam slurry was then administered in until near complete hemostasis. The catheter was flushed and completion angiogram was performed from the proximal right corona mortis without evidence of persistent arterial blushing or active extravasation. The catheters were removed. A 6 French Angio-Seal device was  used to close the left common femoral artery arteriotomy site which was deployed successfully. The distal pulses were unchanged. The patient tolerated the procedure well was transferred to the floor in stable condition. IMPRESSION: 1. 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 2. Technically successful coil embolization of the superior pectineal branch artery. 3. Technically successful Gel-Foam embolization of the replaced right obturator artery. Ruthann Cancer, MD [facility] Radiology Electronically Signed   By: Ruthann Cancer M.D.   On: 10/20/2021 16:50   DG Chest Port 1 View  Result Date: 10/20/2021 CLINICAL DATA:  Sepsis evaluation. EXAM: PORTABLE CHEST 1 VIEW COMPARISON:  Portable chest 09/13/2016 FINDINGS: Cardiac size is normal. Left chest dual lead pacing system with AID wiring is again noted and unchanged. The lungs are mildly emphysematous but clear of infiltrates. There are overlying wires and artifacts. No pleural effusion is seen. Mild osteopenia. IMPRESSION: No active disease.  Stable COPD chest. Electronically Signed   By: Telford Nab M.D.   On: 10/20/2021 06:06   DG Hip Port Unilat W or Wo Pelvis 1 View Right  Result Date: 10/20/2021 CLINICAL DATA:  75 year old male with history of trauma from a fall. Right-sided hip pain. EXAM: DG HIP (WITH OR WITHOUT PELVIS) 1V PORT RIGHT COMPARISON:  No priors. FINDINGS: Three views of the bony pelvis in the right hip demonstrate no definite acute displaced fracture, subluxation or dislocation. There is joint space narrowing, subchondral sclerosis, subchondral cyst formation and osteophyte formation in the hip joints bilaterally, indicative of moderate to severe osteoarthritis. IMPRESSION: 1. No acute radiographic abnormality of the bony pelvis or the right hip. 2.  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 Electronically Signed   By: Vinnie Langton M.D.   On: 10/20/2021 06:06   IR EMBO ART  VEN HEMORR LYMPH EXTRAV  INC GUIDE ROADMAPPING  Result Date: 10/20/2021 INDICATION: 75 year old male status post fall with multiple pelvic fractures and pelvic hematoma with active extravasation. EXAM: 1. Ultrasound-guided vascular access of the left common femoral artery. 2. Catheterization angiography of the right common iliac artery, right external iliac artery, anomalous right obturator artery, and obturator pectineal branch artery. 3. Coil embolization of obturator pectineal branch artery. 4. 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 MEDICATIONS: None. ANESTHESIA/SEDATION: Moderate (conscious) sedation was employed during this procedure. A total of Versed 2 mg and Fentanyl 100 mcg was administered intravenously. Moderate Sedation Time: 39 minutes. The patient's level of consciousness and vital signs were monitored continuously by radiology nursing throughout the procedure under my direct supervision. FLUOROSCOPY TIME:  Fluoroscopy Time: 8.3 minutes (161 mGy). COMPLICATIONS: None immediate. PROCEDURE: Informed consent was obtained from the  patient following explanation of the procedure, risks, benefits and alternatives. The patient understands, agrees and consents for the procedure. All questions were addressed. A time out was performed prior to the initiation of the procedure. Maximal barrier sterile technique utilized including caps, mask, sterile gowns, sterile gloves, large sterile drape, hand hygiene, and Betadine prep. The left groin was prepped and draped in standard fashion. Preprocedure ultrasound evaluation demonstrated patency of the left common femoral artery. The procedure was planned. Subdermal Local anesthesia was provided with 1% lidocaine. A small skin nick was made. Under direct ultrasound visualization, the left common femoral  artery was accessed with a 20 gauge micropuncture needle. A permanent image was captured and stored in the record. A micropuncture set was introduced and a limited left lower extremity angiogram was performed which demonstrated adequate puncture site for closure device use. A J wire was inserted over which the micropuncture set was exchanged for a 5 [place], 10 cm vascular sheath. An Omni Flush catheter was inserted over the wire to the level of the distal abdominal aorta. The wire was removed and exchanged for a Glidewire which was then positioned in the right external iliac artery. The Omni Flush catheter was advanced to the level of the common iliac artery. Angiogram was performed from the right common iliac artery which demonstrated wide patency of the inflow vessels. 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 The Omni Flush catheter was exchanged over a J wire for a 5 French angled tip Kumpe the catheter. The Kumpe catheter was directed to the distal right external iliac artery. Through the Kumpe the catheter, a Lantern microcatheter and Fathom 16 microwire were inserted in used to select the corona mortis. Angiogram was performed from the proximal corona mortis which demonstrated active extravasation arising from a superior pectineal branch. There is also a larger focus of blushing without evidence of active extravasation just to the right word aspect of the pubic symphysis, presumed prostatic branch. The superior pectineal branch was then catheterized. Repeat angiogram demonstrated persistent active extravasation/pseudoaneurysm. Coil embolization was then performed with 2, 1 x 4 mm low-profile Penumbra coils followed by a single 2 x 4 mm coil. The catheter was retracted slightly and repeat angiogram was performed  which demonstrated complete embolization of the superior pectineal branch. The catheter was retracted into the proximal right corona mortis. A Gel-Foam slurry was then administered in until near complete hemostasis. The catheter was flushed and completion angiogram was performed from the proximal right corona mortis without evidence of persistent arterial blushing or active extravasation. The catheters were removed. A 6 French Angio-Seal device was used to close the left common femoral artery arteriotomy site which was deployed successfully. The distal pulses were unchanged. The patient tolerated the procedure well was transferred to the floor in stable condition. IMPRESSION: 1. 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 2. Technically successful coil embolization of the superior pectineal branch artery. 3. Technically successful Gel-Foam embolization of the replaced right obturator artery. Ruthann Cancer, MD [facility] Radiology Electronically Signed   By: Ruthann Cancer M.D.   On: 10/20/2021 16:50   CT Angio Abd/Pel w/ and/or w/o  Result Date: 10/20/2021 CLINICAL DATA:  Right  pubic rami fractures. Hematoma in the right hemipelvis. Evaluate for ongoing bleeding. EXAM: CT ANGIOGRAPHY ABDOMEN AND PELVIS WITH CONTRAST TECHNIQUE: Multidetector CT imaging of the abdomen and pelvis was performed using the standard protocol during bolus administration of intravenous contrast. Multiplanar reconstructed images and MIPs were obtained and reviewed to evaluate the vascular anatomy. CONTRAST:  140mL OMNIPAQUE IOHEXOL 350 MG/ML SOLN COMPARISON:  CT right hip 10/20/2021 FINDINGS: VASCULAR Aorta: Normal caliber aorta without aneurysm, dissection, vasculitis or significant stenosis. Celiac: Patent without evidence of aneurysm, dissection,  vasculitis or significant stenosis. SMA: Patent without evidence of aneurysm, dissection, vasculitis or significant stenosis. Renals: Both renal arteries are patent without evidence of aneurysm, dissection, vasculitis, fibromuscular dysplasia or significant stenosis. IMA: Patent without evidence of aneurysm, dissection, vasculitis or significant stenosis. Inflow: Patent without evidence of aneurysm, dissection, vasculitis or significant stenosis. Proximal Outflow: Proximal femoral arteries are patent bilaterally. There is a coronal mortis vascular variant originating from the distal right external iliac artery. Coronal mortis branches appear to be supplying areas of extravasation along the right side of the prostate on sequence 5 image 220 and near the right superior pubic ramus on sequence 5, image 208. These areas of contrast extravasation enlarge on the venous images. Veins: Portal venous system is patent. Hepatic veins are patent. Renal veins are patent. Limited evaluation of the iliac veins due to the timing of the study. Review of the MIP images confirms the above findings. NON-VASCULAR Lower chest: Small nodules in the left lower lobe and some could be calcified. Largest nodule measures approximately 3 mm. Dependent changes in the lower lungs. No pleural effusions. Patient has a of ICD. Hepatobiliary: Normal appearance of the liver. No biliary dilatation. Small calcified gallstone at the gallbladder base without distention or inflammatory changes. Pancreas: Unremarkable. No pancreatic ductal dilatation or surrounding inflammatory changes. Spleen: Normal in size without focal abnormality. Adrenals/Urinary Tract: Normal adrenal glands. 1.8 cm exophytic left renal cyst. No suspicious renal lesions. Probable small cyst in the right kidney upper pole. Negative for hydronephrosis. Urinary bladder is displaced towards the left from the right hemipelvic hematoma. Stomach/Bowel: Stomach is within normal limits.  Appendix appears normal. No evidence of bowel wall thickening, distention, or inflammatory changes. Lymphatic: No lymph node enlargement in the abdomen or pelvis. Reproductive: Calcifications in the prostate. Small focus contrast extravasation along the right anterior aspect of the prostate. Seminal vesicles are slightly displaced towards the left. Other: There is a large right-sided pelvic hematoma that measures 8.2 x 6.0 x 5.9 cm. Small focus of bleeding within this large pelvic hematoma. Evidence for contrast accumulating along the right anterior aspect of the prostate compatible with contrast extravasation and bleeding. There is additional blood in the posterior right hemipelvis and there is blood tracking up along the anterior lower abdomen and into the right paracolic gutter region. Musculoskeletal: Minimally displaced right superior pubic ramus fracture. Displaced right inferior pubic ramus fracture. Fracture of the right inferior sacrum, best seen on sequence 3, image 848. Left pubic rami are intact. Both hips are located. Minimal retrolisthesis of L4 on L5. Disc space narrowing at L3-L4 and L4-L5. IMPRESSION: VASCULAR 1. Two foci of active bleeding in the right hemipelvis. Largest focus of bleeding is along the right anterior aspect of the prostate. Smaller focus of bleeding is near the right superior pubic ramus. These areas of bleeding appear to be supplied by a corona mortis branch that originates from the distal right external iliac artery. NON-VASCULAR 1. Fractures of the right superior and right  inferior pubic ramus. Right sacral fracture. Large right hemipelvic hematoma with blood extending into the lower abdomen. Bladder and seminal vesicles are displaced from the hematoma. 2. Small nonspecific nodules in left lung base, some of these could be calcified. Largest nodule measures 3 mm. No follow-up needed if patient is low-risk (and has no known or suspected primary neoplasm). Non-contrast chest CT  can be considered in 12 months if patient is high-risk. This recommendation follows the consensus statement: Guidelines for Management of Incidental Pulmonary Nodules Detected on CT Images: From the Fleischner Society 2017; Radiology 2017; 284:228-243. 3. Cholelithiasis. No evidence for gallbladder distention or inflammation. These results were called by telephone at the time of interpretation on 10/20/2021 at 12:20 pm to provider Wynona Dove , who verbally acknowledged these results. Electronically Signed   By: Markus Daft M.D.   On: 10/20/2021 13:19        Scheduled Meds:  atorvastatin  40 mg Oral QPM   buPROPion  150 mg Oral QHS   buPROPion  300 mg Oral Daily   Chlorhexidine Gluconate Cloth  6 each Topical Daily   donepezil  5 mg Oral Daily   insulin aspart  0-9 Units Subcutaneous TID WC   memantine  10 mg Oral BID   sodium chloride flush  3 mL Intravenous Q12H   Continuous Infusions:  sodium chloride       LOS: 0 days        Hosie Poisson, MD [facility]   To contact the attending provider between 7A-7P or the covering provider during after hours 7P-7A, please log into the web site [email or web address] and access using universal [place] password for that web site. If you do not have the password, please call the hospital operator.  10/21/2021, 1:45 PM

## 2021-10-21 NOTE — Evaluation (Signed)
Physical Therapy Evaluation Patient Details Name: Kevin [name] MRN: [id number] DOB: [birth year] Today's Date: 10/21/2021  History of Present Illness  Pt is a 75 y.o. male admitted on 10/20/21 after a mechanical fall with hip pain. CT R hip showed superior and inferior pubis ramus fx, right inferior sacrum fracture, and two foci of active bleeding in R hemipelvis. S/p pelvic angiogram and embolization on 12/13. 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  Clinical Impression  Pt presents R hip pain, impaired balance, decreased functional mobility, and generalized weakness secondary to above. PTA, pt was independent and working as a Psychologist, occupational at [employer] Pt lives with his wife who was present during session. Currently, pt requiring up to Mod A (+2 safety) for mobility. Pt motivated to move, despite significant pain. Pt is a great candidate for intensive therapy to maximize functional mobility and independence before returning home. If pt were to return home, would need frequent assist for mobility; wife is not confident in providing assist needed. Will continue to follow acutely to address established goals.      Recommendations for follow up therapy are one component of a multi-disciplinary discharge planning process, led by the attending physician.  Recommendations may be updated based on patient status, additional functional criteria and insurance authorization.  Follow Up Recommendations Acute inpatient rehab (3hours/day)    Assistance Recommended at Discharge Frequent or constant Supervision/Assistance  Functional Status Assessment Patient has had a recent decline in their functional status and demonstrates the ability to make significant improvements in function in a reasonable and predictable amount of time.  Equipment Recommendations  Rolling walker (2 wheels) (TBD)     Recommendations for Other Services       Precautions / Restrictions Precautions Precautions: Fall Restrictions Weight Bearing Restrictions: Yes RLE Weight Bearing: Weight bearing as tolerated      Mobility  Bed Mobility Overal bed mobility: Needs Assistance Bed Mobility: Supine to Sit     Supine to sit: Mod assist     General bed mobility comments: increased time to advance BLE OOB, assist with RLE and used handheld assist for trunk elevation. Cues to keep legs close together to minimize pain.    Transfers Overall transfer level: Needs assistance Equipment used: Rolling walker (2 wheels) Transfers: Sit to/from Stand Sit to Stand: Min assist;From elevated surface           General transfer comment: Stood x2 from elevated EOB. Min assist for stability and trunk elevation. Heavy use of UE. Cues for hand placement with RW    Ambulation/Gait Ambulation/Gait assistance: Mod assist;Min assist;+2 safety/equipment Gait Distance (Feet): 20 Feet Assistive device: Rolling walker (2 wheels) Gait Pattern/deviations: Step-to pattern;Decreased stance time - right;Decreased weight shift to right;Trunk flexed;Antalgic Gait velocity: decreased   Pre-gait activities: Several marches in place, mod A for stability. Required a seated rest break prior to ambulating to chair. General Gait Details: Up to mod A for stability (+2 safety), cues for RW management and for pacing.  Stairs            Wheelchair Mobility    Modified Rankin (Stroke Patients Only)       Balance Overall balance assessment: Needs assistance Sitting-balance support: Single extremity supported;Feet supported Sitting balance-Leahy Scale: Fair     Standing balance support: Bilateral upper extremity supported;Reliant on assistive device for balance Standing balance-Leahy Scale: Poor Standing balance comment: Reliant on BUE to  offload painful RLE in static standing and mobility, physical assist needed  for stability                             Pertinent Vitals/Pain Pain Assessment: 0-10 Pain Score: 2  (up to 8/10 with mobility) Pain Location: R hip Pain Descriptors / Indicators: Grimacing;Sore Pain Intervention(s): Limited activity within patient's tolerance;Monitored during session;Patient requesting pain meds-RN notified;Repositioned    Home Living Family/patient expects to be discharged to:: Private residence Living Arrangements: Spouse/significant other Available Help at Discharge: Family Type of Home: House Home Access: Stairs to enter Entrance Stairs-Rails: None Entrance Stairs-Number of Steps: 2     Home Equipment: Other (comment) (Wife states they have a bench they can sit in the shower. She thinks she has a RW but is going to check.) Additional Comments: Lives with wife who was present during session. She works part-time across the street.    Prior Function Prior Level of Function : Independent/Modified Independent             Mobility Comments: Ambulated without DME. 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       Hand Dominance        Extremity/Trunk Assessment   Upper Extremity Assessment Upper Extremity Assessment: Overall WFL for tasks assessed    Lower Extremity Assessment Lower Extremity Assessment: Generalized weakness       Communication      Cognition Arousal/Alertness: Awake/alert Behavior During Therapy: WFL for tasks assessed/performed Overall Cognitive Status: Impaired/Different from baseline Area of Impairment: Safety/judgement;Problem solving;Attention                   Current Attention Level: Sustained;Selective     Safety/Judgement: Decreased awareness of safety   Problem Solving: Slow processing;Difficulty sequencing General Comments: Benefited from safety cues and sequencing with RW. Some repeition of phrases needed. Required redirection to task.        General Comments  General comments (skin integrity, edema, etc.): Pt c/o dizziness when standing which subsided after sitting back down. Pre-mobility BP: 123/51. Sitting after standing activity: 96/79. Post-mobility: 148/61. Wife present during session and supportive.    Exercises     Assessment/Plan    PT Assessment Patient needs continued PT services  PT Problem List Decreased strength;Decreased mobility;Decreased safety awareness;Decreased activity tolerance;Decreased balance;Pain       PT Treatment Interventions DME instruction;Therapeutic exercise;Gait training;Balance training;Stair training;Functional mobility training;Therapeutic activities;Patient/family education    PT Goals (Current goals can be found in the Care Plan section)  Acute Rehab PT Goals Patient Stated Goal: to return to volunteering and golfing PT Goal Formulation: With patient/family Time For Goal Achievement: 11/04/21 Potential to Achieve Goals: Good    Frequency Min 5X/week   Barriers to discharge        Co-evaluation               AM-PAC PT "6 Clicks" Mobility  Outcome Measure Help needed turning from your back to your side while in a flat bed without using bedrails?: None Help needed moving from lying on your back to sitting on the side of a flat bed without using bedrails?: A Lot Help needed moving to and from a bed to a chair (including a wheelchair)?: A Lot Help needed standing up from a chair using your arms (e.g., wheelchair or bedside chair)?: A Lot Help needed to walk in hospital room?: Total Help needed climbing 3-5 steps  with a railing? : Total 6 Click Score: 12    End of Session Equipment Utilized During Treatment: Gait belt Activity Tolerance: Patient tolerated treatment well;Patient limited by pain Patient left: in chair;with family/visitor present;with call bell/phone within reach;with chair alarm set Nurse Communication: Mobility status;Patient requests pain meds PT Visit Diagnosis:  Unsteadiness on feet (R26.81);Other abnormalities of gait and mobility (R26.89);History of falling (Z91.81);Muscle weakness (generalized) (M62.81);Pain Pain - Right/Left: Right Pain - part of body: Hip    Time: 9741-6384 PT Time Calculation (min) (ACUTE ONLY): 34 min   Charges:   PT Evaluation $PT Eval Moderate Complexity: 1 Mod PT Treatments $Therapeutic Activity: 8-22 mins        Brandon Melnick, SPT   Brandon Melnick 10/21/2021, 1:27 PM

## 2021-10-21 NOTE — Progress Notes (Signed)
OT Cancellation Note  Patient Details Name: Kevin [name] MRN: [id number] DOB: Mar 10, 1946   Cancelled Treatment:    Reason Eval/Treat Not Completed: Other (comment) Pt reports just receiving lunch, politely requesting OT come back later  Layla Maw 10/21/2021, 1:55 PM

## 2021-10-21 NOTE — Progress Notes (Signed)
Inpatient Rehab Admissions Coordinator:   I will place an order for CIR consult per our protocol so we can fully assess candidacy.   Shann Medal, PT, DPT Admissions Coordinator [phone] 10/21/21  5:42 PM

## 2021-10-22 ENCOUNTER — Inpatient Hospital Stay (HOSPITAL_COMMUNITY): Payer: No Typology Code available for payment source

## 2021-10-22 DIAGNOSIS — E785 Hyperlipidemia, unspecified: Secondary | ICD-10-CM

## 2021-10-22 LAB — URINALYSIS, ROUTINE W REFLEX MICROSCOPIC
Glucose, UA: 250 mg/dL — AB
Ketones, ur: NEGATIVE mg/dL
Nitrite: NEGATIVE
Protein, ur: 30 mg/dL — AB
Specific Gravity, Urine: 1.015 (ref 1.005–1.030)
pH: 7.5 (ref 5.0–8.0)

## 2021-10-22 LAB — URINALYSIS, MICROSCOPIC (REFLEX)
RBC / HPF: >50 RBC/hpf (ref 0–5)
Squamous Epithelial / HPF: NONE SEEN (ref 0–5)

## 2021-10-22 LAB — CBC
HCT: 32 % — ABNORMAL LOW (ref 39.0–52.0)
Hemoglobin: 10.4 g/dL — ABNORMAL LOW (ref 13.0–17.0)
MCH: 33 pg (ref 26.0–34.0)
MCHC: 32.5 g/dL (ref 30.0–36.0)
MCV: 101.6 fL — ABNORMAL HIGH (ref 80.0–100.0)
Platelets: 99 10*3/uL — ABNORMAL LOW (ref 150–400)
RBC: 3.15 MIL/uL — ABNORMAL LOW (ref 4.22–5.81)
RDW: 12.5 % (ref 11.5–15.5)
WBC: 7.7 10*3/uL (ref 4.0–10.5)
nRBC: 0 % (ref 0.0–0.2)

## 2021-10-22 LAB — BASIC METABOLIC PANEL
Anion gap: 5 (ref 5–15)
BUN: 12 mg/dL (ref 8–23)
CO2: 28 mmol/L (ref 22–32)
Calcium: 8.5 mg/dL — ABNORMAL LOW (ref 8.9–10.3)
Chloride: 105 mmol/L (ref 98–111)
Creatinine, Ser: 1.39 mg/dL — ABNORMAL HIGH (ref 0.61–1.24)
GFR, Estimated: 53 mL/min — ABNORMAL LOW (ref >60)
Glucose, Bld: 130 mg/dL — ABNORMAL HIGH (ref 70–99)
Potassium: 4.7 mmol/L (ref 3.5–5.1)
Sodium: 138 mmol/L (ref 135–145)

## 2021-10-22 LAB — HEMOGLOBIN A1C
Hgb A1c MFr Bld: 6.7 % — ABNORMAL HIGH (ref 4.8–5.6)
Mean Plasma Glucose: 145.59 mg/dL

## 2021-10-22 LAB — GLUCOSE, CAPILLARY
Glucose-Capillary: 125 mg/dL — ABNORMAL HIGH (ref 70–99)
Glucose-Capillary: 152 mg/dL — ABNORMAL HIGH (ref 70–99)
Glucose-Capillary: 173 mg/dL — ABNORMAL HIGH (ref 70–99)

## 2021-10-22 NOTE — Progress Notes (Signed)
IP rehab admissions - I met with patient today and gave him rehab booklets.  He would like CIR.  [facility] and [facility].  I will have my partner follow up once we hear back from insurance case manager.  Call for questions.  [phone]

## 2021-10-22 NOTE — Progress Notes (Signed)
Physical Therapy Treatment Patient Details Name: Kevin [name] MRN: [id number] DOB: 1946-05-10 Today's Date: 10/22/2021   History of Present Illness Pt is a 75 y.o. male admitted on 10/20/21 after a mechanical fall with hip pain. CT R hip showed superior and inferior pubis ramus fx, right inferior sacrum fracture, and two foci of active bleeding in R hemipelvis. S/p pelvic angiogram and embolization on 12/13. 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    PT Comments    Pt progressing with mobility. Able to progress ambulation into hall today with a chair follow; pt requiring frequent external assist to maintain upright and prevent LOB. Frequent cues needed for safety and sequencing. Pt limited by pain, decreased activity tolerance, generalized weakness, poor balance strategies, and impaired cognition. Despite pain, pt highly motivated to participate and regain PLOF. Pt remains an excellent candidate for intensive CIR-level therapies to maximize functional mobility and independence prior to return home. Will continue to follow acutely to address establihsed goals.    Recommendations for follow up therapy are one component of a multi-disciplinary discharge planning process, led by the attending physician.  Recommendations may be updated based on patient status, additional functional criteria and insurance authorization.  Follow Up Recommendations  Acute inpatient rehab (3hours/day)     Assistance Recommended at Discharge Frequent or constant Supervision/Assistance  Equipment Recommendations  Rolling walker (2 wheels) (TBD - wife is checking to see if she has one)    Recommendations for Other Services       Precautions / Restrictions Precautions Precautions: Fall Restrictions Weight Bearing Restrictions: Yes RLE Weight Bearing: Weight bearing as tolerated      Mobility  Bed Mobility Overal bed mobility: Needs Assistance Bed Mobility: Supine to Sit;Sit to Supine     Supine to sit: HOB elevated;Supervision Sit to supine: Supervision   General bed mobility comments: able to progress BLE and elevate trunk using bedrails and increased time    Transfers Overall transfer level: Needs assistance Equipment used: Rolling walker (2 wheels)   Sit to Stand: Mod assist     Step pivot transfers: Mod assist     General transfer comment: Stood 4x from recliner and once from EOB, heavy use of UE. Mod A for elevation, stability, and to gain upright posture. Cues for hand placement    Ambulation/Gait Ambulation/Gait assistance: Min assist;Mod assist;+2 safety/equipment Gait Distance (Feet): 20 Feet (+18, +4) Assistive device: Rolling walker (2 wheels) Gait Pattern/deviations: Step-to pattern;Decreased stance time - right;Decreased weight shift to right;Trunk flexed;Antalgic;Knees buckling Gait velocity: decreased     General Gait Details: Up to Heavy mod A for stability (+2 for chair follow), cues for RW management, pacing, and self-monitoring pain/fatigue. Pt did not request rest break, despite R knee starting to buckle.   Stairs             Wheelchair Mobility    Modified Rankin (Stroke Patients Only)       Balance Overall balance assessment: Needs assistance Sitting-balance support: Feet supported;No upper extremity supported Sitting balance-Leahy Scale: Fair     Standing balance support: Bilateral upper extremity supported;Reliant on assistive device for balance Standing balance-Leahy Scale: Poor Standing balance comment: Reliant on BUE to offload painful RLE in static standing and mobility, physical assist needed for stability  Cognition Arousal/Alertness: Awake/alert Behavior During Therapy: WFL for tasks assessed/performed Overall Cognitive Status: Impaired/Different from  baseline Area of Impairment: Safety/judgement;Problem solving;Attention;Orientation;Memory;Following commands;Awareness                 Orientation Level: Disoriented to;Time;Situation Current Attention Level: Sustained Memory: Decreased short-term memory Following Commands: Follows one step commands with increased time;Follows one step commands consistently Safety/Judgement: Decreased awareness of safety;Decreased awareness of deficits Awareness: Intellectual Problem Solving: Slow processing;Difficulty sequencing;Requires verbal cues General Comments: Pt got days mixed up, thought today was his volunteer day. Pt said he hadn't broken anything when he fell, when he has mutliple fractures. Unable to determine when he needs a rest break despite cues.        Exercises      General Comments        Pertinent Vitals/Pain Pain Assessment: Faces Pain Score: 2  (at rest) Faces Pain Scale: Hurts whole lot (with mobility) Pain Location: R hip Pain Descriptors / Indicators: Grimacing;Sore Pain Intervention(s): Monitored during session;Patient requesting pain meds-RN notified;Limited activity within patient's tolerance;Repositioned    Home Living                          Prior Function            PT Goals (current goals can now be found in the care plan section) Acute Rehab PT Goals Patient Stated Goal: to return to volunteering and golfing PT Goal Formulation: With patient/family Time For Goal Achievement: 11/04/21 Potential to Achieve Goals: Good Progress towards PT goals: Progressing toward goals    Frequency    Min 5X/week      PT Plan Current plan remains appropriate    Co-evaluation              AM-PAC PT "6 Clicks" Mobility   Outcome Measure  Help needed turning from your back to your side while in a flat bed without using bedrails?: A Little Help needed moving from lying on your back to sitting on the side of a flat bed without using  bedrails?: A Lot Help needed moving to and from a bed to a chair (including a wheelchair)?: A Lot Help needed standing up from a chair using your arms (e.g., wheelchair or bedside chair)?: A Lot Help needed to walk in hospital room?: Total Help needed climbing 3-5 steps with a railing? : Total 6 Click Score: 11    End of Session Equipment Utilized During Treatment: Gait belt Activity Tolerance: Patient tolerated treatment well Patient left: in bed;with bed alarm set;with call bell/phone within reach Nurse Communication: Mobility status PT Visit Diagnosis: Unsteadiness on feet (R26.81);Other abnormalities of gait and mobility (R26.89);History of falling (Z91.81);Muscle weakness (generalized) (M62.81);Pain Pain - Right/Left: Right Pain - part of body: Hip     Time: 8242-3536 PT Time Calculation (min) (ACUTE ONLY): 28 min  Charges:  $Gait Training: 8-22 mins $Therapeutic Activity: 8-22 mins                     Brandon Melnick, SPT   Ilamae Geng 10/22/2021, 1:28 PM

## 2021-10-22 NOTE — Progress Notes (Signed)
Mobility Specialist: Progress Note   10/22/21 1501  Mobility  Activity Ambulated in room  Level of Assistance Minimal assist, patient does 75% or more  Assistive Device Front wheel walker  Distance Ambulated (ft) 24 ft  Mobility Ambulated with assistance in room  Mobility Response Tolerated well  Mobility performed by Mobility specialist  Bed Position Chair  $Mobility charge 1 Mobility   Pre-Mobility: 96 HR, 124/64 BP, 94% SpO2 Post-Mobility: 91 HR, 140/67 BP, 98% SpO2  Pt c/o pain in his R hip during ambulation, otherwise no other c/o. Pt to recliner per request with call bell in reach.   [facility] Jamion Carter Mobility Specialist Mobility Specialist [address] [place]: [phone] Mobility Specialist 2 [place]: [phone]

## 2021-10-22 NOTE — Progress Notes (Signed)
PROGRESS NOTE    Kevin [name]  YSA:[id number] DOB: 10-29-1946 DOA: 10/20/2021 PCP: [name], MD    Chief Complaint  Patient presents with   [name] Exposure    Brief Narrative:    Kevin [name] is a 75 y.o. 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  Assessment & Plan:   Principal Problem:   Fall Active Problems:   Hypertension   DM2 (diabetes mellitus, type 2) ([place])   Hyperlipidemia   Chronic combined systolic and diastolic heart failure (HCC)   Pelvic hematoma in male   Pubic ramus fracture (HCC)   Acute blood loss anemia   Mechanical fall with pelvic hematoma, right superior and inferior ramus fracture:  - s/p Pelvic angiogram, coil embolization of right pectineal branch artery. Gelfoam embolization of the replaced right obturator/corona mortis artery.  - pain control and therapy eval recommending CIR.    Chronic combined systolic and diastolic heart failure:  He appears to be stable/ Euvolemic.    Acute blood loss anemia post op/ Hematoma: -drop in hemoglobin 14 to 10.6.  - hemoglobin remains stable around 10.  - transfuse to keep hemoglobin greater than 8.     Hypertension:  BP parameters are optimal.    Type 2 DM, with hyperglycemia: CBG (last 3)  Recent Labs    10/21/21 2108 10/22/21 0614 10/22/21 1233  GLUCAP 111* 125* 152*    Resume SSI. Hemoglobin A1c is 6.7%    Vascular dementia:  Resume aricept and namenda.     Hyperlipidemia:  Resume statin.   Stage 3 a CKD:  Creatinine stable around 1.2  Urinary retention: possibly from pelvic hematoma.  S/p foley catheter placement.  Pt has persistent hematuria.  UA reordered.  US renal ordered.     DVT  prophylaxis: scd's Code Status: DNR Family Communication: family at bedside Disposition:   Status is: Observation  The patient will require care spanning > 2 midnights and should be moved to inpatient because: CIR       Consultants:  IR  Procedures:   Pelvic angiogram, coil embolization of right pectineal branch artery. Gelfoam embolization of the replaced right obturator/corona mortis artery.  Antimicrobials: none.   Subjective: No chest pain or sob. No nausea, vomiting.  No new complaints.   Objective: Vitals:   10/22/21 0339 10/22/21 0800 10/22/21 0915 10/22/21 1122  BP: (!) 118/57 (!) 155/71 132/65 (!) 149/69  Pulse: 79 95 83 85  Resp: 17 20 16    Temp: 98.7 F (37.1 C) 97.9 F (36.6 C) 98.2 F (36.8 C) (!) 97.5 F (36.4 C)  TempSrc: Oral Oral Oral Oral  SpO2: 94% 96% 94%   Weight: 76.7 kg     Height:        Intake/Output Summary (Last 24 hours) at 10/22/2021 1322 Last data filed at 10/22/2021 0900 Gross per 24 hour  Intake 720 ml  Output 1375 ml  Net -655 ml    Filed Weights   10/20/21 2123 10/21/21 0424 10/22/21 0339  Weight: 74 kg 76.9 kg 76.7 kg    Examination:  General exam: Appears calm and comfortable  Respiratory system: Clear to auscultation. Respiratory effort normal. Cardiovascular system: S1 & S2 heard, RRR. No JVD, . No pedal edema. Gastrointestinal system: Abdomen is nondistended, soft and nontender.  Normal bowel sounds heard. Central nervous system: Alert and oriented. No focal neurological deficits. Extremities: Symmetric 5 x 5 power. Skin: No rashes, lesions or ulcers Psychiatry: Mood & affect appropriate.      Data Reviewed: I have personally reviewed following labs and imaging studies  CBC: Recent Labs  Lab 10/20/21 0357 10/20/21 1939 10/21/21 0156 10/22/21 0359  WBC 16.8* 7.9 8.6 7.7  NEUTROABS 14.4*  --   --   --   HGB 14.2 11.4* 10.6* 10.4*  HCT 43.2 35.1* 32.8* 32.0*  MCV 102.6* 102.6* 101.5* 101.6*  PLT 135*  104* 103* 99*     Basic Metabolic Panel: Recent Labs  Lab 10/20/21 0357 10/20/21 1938 10/21/21 0156 10/22/21 0359  NA 138 138 135 138  K 3.9 4.4 4.6 4.7  CL 107 107 106 105  CO2 21* 25 25 28   GLUCOSE 209* 96 166* 130*  BUN 15 14 14 12   CREATININE 1.32* 1.28* 1.25* 1.39*  CALCIUM 8.1* 8.4* 8.3* 8.5*     GFR: Estimated Creatinine Clearance: 49.8 mL/min (A) (by C-G formula based on SCr of 1.39 mg/dL (H)).  Liver Function Tests: Recent Labs  Lab 10/20/21 1938  AST 19  ALT 11  ALKPHOS 66  BILITOT 1.0  PROT 5.2*  ALBUMIN 2.9*     CBG: Recent Labs  Lab 10/21/21 1214 10/21/21 1633 10/21/21 2108 10/22/21 0614 10/22/21 1233  GLUCAP 177* 156* 111* 125* 152*      Recent Results (from the past 240 hour(s))  Blood culture (routine x 2)     Status: None (Preliminary result)   Collection Time: 10/20/21  5:44 AM   Specimen: BLOOD  Result Value Ref Range Status   Specimen Description BLOOD RIGHT ANTECUBITAL  Final   Special Requests   Final    BOTTLES DRAWN AEROBIC AND ANAEROBIC Blood Culture adequate volume   Culture   Final    NO GROWTH 2 DAYS Performed at [facility], [place] [address] [place], [place] [address]    Report Status PENDING  Incomplete  Blood culture (routine x 2)     Status: None (Preliminary result)   Collection Time: 10/20/21  5:49 AM   Specimen: BLOOD LEFT HAND  Result Value Ref Range Status   Specimen Description BLOOD LEFT HAND  Final   Special Requests   Final    BOTTLES DRAWN AEROBIC AND ANAEROBIC Blood Culture adequate volume   Culture   Final    NO GROWTH 2 DAYS Performed at [facility], [place] [address] [place], [place] [address]    Report Status PENDING  Incomplete  Urine Culture     Status: None   Collection Time: 10/20/21  6:27 AM   Specimen: In/Out Cath Urine  Result Value Ref Range Status   Specimen Description IN/OUT CATH URINE  Final   Special Requests NONE  Final   Culture   Final    NO GROWTH Performed at  [facility], [place] [address], [place], [place] [address]    Report Status 10/21/2021 FINAL  Final  Blood Culture (routine x 2)     Status: None (Preliminary result)   Collection Time: 10/20/21  7:36 PM   Specimen: BLOOD  Result Value Ref Range Status   Specimen Description BLOOD RIGHT ANTECUBITAL  Final   Special Requests   Final    BOTTLES DRAWN AEROBIC AND ANAEROBIC Blood Culture results may not be optimal due to an excessive volume of blood received in culture  bottles   Culture   Final    NO GROWTH 2 DAYS Performed at [facility], [place] [address], [place], [place] [address]    Report Status PENDING  Incomplete  Blood Culture (routine x 2)     Status: None (Preliminary result)   Collection Time: 10/20/21  7:39 PM   Specimen: BLOOD  Result Value Ref Range Status   Specimen Description BLOOD LEFT ANTECUBITAL  Final   Special Requests   Final    BOTTLES DRAWN AEROBIC AND ANAEROBIC Blood Culture adequate volume   Culture   Final    NO GROWTH 2 DAYS Performed at [facility], [place] [address], [place], [place] [address]    Report Status PENDING  Incomplete  Resp Panel by RT-PCR (Flu A&B, Covid) Nasopharyngeal Swab     Status: None   Collection Time: 10/21/21 12:26 AM   Specimen: Nasopharyngeal Swab; Nasopharyngeal(NP) swabs in vial transport medium  Result Value Ref Range Status   SARS Coronavirus 2 by RT PCR NEGATIVE NEGATIVE Final    Comment: (NOTE) SARS-CoV-2 target nucleic acids are NOT DETECTED.  The SARS-CoV-2 RNA is generally detectable in upper respiratory specimens during the acute phase of infection. The lowest concentration of SARS-CoV-2 viral copies this assay can detect is 138 copies/mL. A negative result does not preclude SARS-Cov-2 infection and should not be used as the sole basis for treatment or other patient management decisions. A negative result may occur with  improper specimen collection/handling, submission of specimen other than  nasopharyngeal swab, presence of viral mutation(s) within the areas targeted by this assay, and inadequate number of viral copies(<138 copies/mL). A negative result must be combined with clinical observations, patient history, and epidemiological information. The expected result is Negative.  Fact Sheet for Patients:  EntrepreneurPulse.com.au  Fact Sheet for Healthcare Providers:  IncredibleEmployment.be  This test is no t yet approved or cleared by the [place] FDA and  has been authorized for detection and/or diagnosis of SARS-CoV-2 by FDA under an Emergency Use Authorization (EUA). This EUA will remain  in effect (meaning this test can be used) for the duration of the COVID-19 declaration under Section 564(b)(1) of the Act, 21 U.S.C.section 360bbb-3(b)(1), unless the authorization is terminated  or revoked sooner.       Influenza A by PCR NEGATIVE NEGATIVE Final   Influenza B by PCR NEGATIVE NEGATIVE Final    Comment: (NOTE) The Xpert Xpress SARS-CoV-2/FLU/RSV plus assay is intended as an aid in the diagnosis of influenza from Nasopharyngeal swab specimens and should not be used as a sole basis for treatment. Nasal washings and aspirates are unacceptable for Xpert Xpress SARS-CoV-2/FLU/RSV testing.  Fact Sheet for Patients: EntrepreneurPulse.com.au  Fact Sheet for Healthcare Providers: IncredibleEmployment.be  This test is not yet approved or cleared by the [place] FDA and has been authorized for detection and/or diagnosis of SARS-CoV-2 by FDA under an Emergency Use Authorization (EUA). This EUA will remain in effect (meaning this test can be used) for the duration of the COVID-19 declaration under Section 564(b)(1) of the Act, 21 U.S.C. section 360bbb-3(b)(1), unless the authorization is terminated or revoked.  Performed at [facility], [place] [address], [place], [place] [address]            Radiology Studies: IR Angiogram Visceral Selective  Result Date: 10/20/2021 INDICATION: 75 year old male status post fall with multiple pelvic fractures and pelvic hematoma with active extravasation. EXAM: 1. Ultrasound-guided vascular access of the left common femoral  artery. 2. Catheterization angiography of the right common iliac artery, right external iliac artery, anomalous right obturator artery, and obturator pectineal branch artery. 3. Coil embolization of obturator pectineal branch artery. 4. 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 MEDICATIONS: None. ANESTHESIA/SEDATION: Moderate (conscious) sedation was employed during this procedure. A total of Versed 2 mg and Fentanyl 100 mcg was administered intravenously. Moderate Sedation Time: 39 minutes. The patient's level of consciousness and vital signs were monitored continuously by radiology nursing throughout the procedure under my direct supervision. FLUOROSCOPY TIME:  Fluoroscopy Time: 8.3 minutes (161 mGy). COMPLICATIONS: None immediate. PROCEDURE: Informed consent was obtained from the patient following explanation of the procedure, risks, benefits and alternatives. The patient understands, agrees and consents for the procedure. All questions were addressed. A time out was performed prior to the initiation of the procedure. Maximal barrier sterile technique utilized including caps, mask, sterile gowns, sterile gloves, large sterile drape, hand hygiene, and Betadine prep. The left groin was prepped and draped in standard fashion. Preprocedure ultrasound evaluation demonstrated patency of the left common femoral artery. The procedure was planned. Subdermal Local anesthesia was provided with 1% lidocaine. A small skin nick was made. Under direct ultrasound visualization, the left common femoral artery was accessed with a 20 gauge micropuncture needle. A permanent image was captured and stored in the record. A  micropuncture set was introduced and a limited left lower extremity angiogram was performed which demonstrated adequate puncture site for closure device use. A J wire was inserted over which the micropuncture set was exchanged for a 5 [place], 10 cm vascular sheath. An Omni Flush catheter was inserted over the wire to the level of the distal abdominal aorta. The wire was removed and exchanged for a Glidewire which was then positioned in the right external iliac artery. The Omni Flush catheter was advanced to the level of the common iliac artery. Angiogram was performed from the right common iliac artery which demonstrated wide patency of the inflow vessels. 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 The Omni Flush catheter was exchanged over a J wire for a 5 French angled tip Kumpe the catheter. The Kumpe catheter was directed to the distal right external iliac artery. Through the Kumpe the catheter, a Lantern microcatheter and Fathom 16 microwire were inserted in used to select the corona mortis. Angiogram was performed from the proximal corona mortis which demonstrated active extravasation arising from a superior pectineal branch. There is also a larger focus of blushing without evidence of active extravasation just to the right word aspect of the pubic symphysis, presumed prostatic branch. The superior pectineal branch was then catheterized. Repeat angiogram demonstrated persistent active extravasation/pseudoaneurysm. Coil embolization was then performed with 2, 1 x 4 mm low-profile Penumbra coils followed by a single 2 x 4 mm coil. The catheter was retracted slightly and repeat angiogram was performed which demonstrated complete embolization of the superior pectineal branch. The catheter was retracted into the  proximal right corona mortis. A Gel-Foam slurry was then administered in until near complete hemostasis. The catheter was flushed and completion angiogram was performed from the proximal right corona mortis without evidence of persistent arterial blushing or active extravasation. The catheters were removed. A 6 French Angio-Seal device was used to close the left common femoral artery arteriotomy site which was deployed successfully.  The distal pulses were unchanged. The patient tolerated the procedure well was transferred to the floor in stable condition. IMPRESSION: 1. 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 2. Technically successful coil embolization of the superior pectineal branch artery. 3. Technically successful Gel-Foam embolization of the replaced right obturator artery. Ruthann Cancer, MD [facility] Radiology Electronically Signed   By: Ruthann Cancer M.D.   On: 10/20/2021 16:50   IR Angiogram Pelvis Selective Or Supraselective  Result Date: 10/20/2021 INDICATION: 75 year old male status post fall with multiple pelvic fractures and pelvic hematoma with active extravasation. EXAM: 1. Ultrasound-guided vascular access of the left common femoral artery. 2. Catheterization angiography of the right common iliac artery, right external iliac artery, anomalous right obturator artery, and obturator pectineal branch artery. 3. Coil embolization of obturator pectineal branch artery. 4. 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 MEDICATIONS: None. ANESTHESIA/SEDATION: Moderate (conscious) sedation was employed during this procedure. A total of Versed 2 mg and Fentanyl 100 mcg was administered intravenously. Moderate Sedation Time: 39 minutes. The patient's level of consciousness and vital signs were monitored  continuously by radiology nursing throughout the procedure under my direct supervision. FLUOROSCOPY TIME:  Fluoroscopy Time: 8.3 minutes (161 mGy). COMPLICATIONS: None immediate. PROCEDURE: Informed consent was obtained from the patient following explanation of the procedure, risks, benefits and alternatives. The patient understands, agrees and consents for the procedure. All questions were addressed. A time out was performed prior to the initiation of the procedure. Maximal barrier sterile technique utilized including caps, mask, sterile gowns, sterile gloves, large sterile drape, hand hygiene, and Betadine prep. The left groin was prepped and draped in standard fashion. Preprocedure ultrasound evaluation demonstrated patency of the left common femoral artery. The procedure was planned. Subdermal Local anesthesia was provided with 1% lidocaine. A small skin nick was made. Under direct ultrasound visualization, the left common femoral artery was accessed with a 20 gauge micropuncture needle. A permanent image was captured and stored in the record. A micropuncture set was introduced and a limited left lower extremity angiogram was performed which demonstrated adequate puncture site for closure device use. A J wire was inserted over which the micropuncture set was exchanged for a 5 [place], 10 cm vascular sheath. An Omni Flush catheter was inserted over the wire to the level of the distal abdominal aorta. The wire was removed and exchanged for a Glidewire which was then positioned in the right external iliac artery. The Omni Flush catheter was advanced to the level of the common iliac artery. Angiogram was performed from the right common iliac artery which demonstrated wide patency of the inflow vessels. There is a focus of active extravasation arising just superior to the right pubic ramus which appears to arise from an replaced right obturator artery, otherwise known as corona mortis, originating from the distal  right external iliac artery. The Omni Flush catheter was exchanged over a J wire for a 5 French angled tip Kumpe the catheter. The Kumpe catheter was directed to the distal right external iliac artery. Through the Kumpe the catheter, a Lantern microcatheter and Fathom 16 microwire were inserted in used to select the corona mortis. Angiogram was performed from the proximal corona mortis which demonstrated active extravasation arising from a superior pectineal branch. There is also a larger focus of blushing without evidence of active extravasation just to the right word aspect  of the pubic symphysis, presumed prostatic branch. The superior pectineal branch was then catheterized. Repeat angiogram demonstrated persistent active extravasation/pseudoaneurysm. Coil embolization was then performed with 2, 1 x 4 mm low-profile Penumbra coils followed by a single 2 x 4 mm coil. The catheter was retracted slightly and repeat angiogram was performed which demonstrated complete embolization of the superior pectineal branch. The catheter was retracted into the proximal right corona mortis. A Gel-Foam slurry was then administered in until near complete hemostasis. The catheter was flushed and completion angiogram was performed from the proximal right corona mortis without evidence of persistent arterial blushing or active extravasation. The catheters were removed. A 6 French Angio-Seal device was used to close the left common femoral artery arteriotomy site which was deployed successfully. The distal pulses were unchanged. The patient tolerated the procedure well was transferred to the floor in stable condition. IMPRESSION: 1. 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 2. Technically successful coil embolization of the superior pectineal branch artery. 3. Technically successful Gel-Foam  embolization of the replaced right obturator artery. Ruthann Cancer, MD [facility] Radiology Electronically Signed   By: Ruthann Cancer M.D.   On: 10/20/2021 16:50   IR US Guide Vasc Access Left  Result Date: 10/20/2021 INDICATION: 75 year old male status post fall with multiple pelvic fractures and pelvic hematoma with active extravasation. EXAM: 1. Ultrasound-guided vascular access of the left common femoral artery. 2. Catheterization angiography of the right common iliac artery, right external iliac artery, anomalous right obturator artery, and obturator pectineal branch artery. 3. Coil embolization of obturator pectineal branch artery. 4. 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 MEDICATIONS: None. ANESTHESIA/SEDATION: Moderate (conscious) sedation was employed during this procedure. A total of Versed 2 mg and Fentanyl 100 mcg was administered intravenously. Moderate Sedation Time: 39 minutes. The patient's level of consciousness and vital signs were monitored continuously by radiology nursing throughout the procedure under my direct supervision. FLUOROSCOPY TIME:  Fluoroscopy Time: 8.3 minutes (161 mGy). COMPLICATIONS: None immediate. PROCEDURE: Informed consent was obtained from the patient following explanation of the procedure, risks, benefits and alternatives. The patient understands, agrees and consents for the procedure. All questions were addressed. A time out was performed prior to the initiation of the procedure. Maximal barrier sterile technique utilized including caps, mask, sterile gowns, sterile gloves, large sterile drape, hand hygiene, and Betadine prep. The left groin was prepped and draped in standard fashion. Preprocedure ultrasound evaluation demonstrated patency of the left common femoral artery. The procedure was planned. Subdermal Local anesthesia was provided with 1% lidocaine. A small skin nick was made. Under direct  ultrasound visualization, the left common femoral artery was accessed with a 20 gauge micropuncture needle. A permanent image was captured and stored in the record. A micropuncture set was introduced and a limited left lower extremity angiogram was performed which demonstrated adequate puncture site for closure device use. A J wire was inserted over which the micropuncture set was exchanged for a 5 [place], 10 cm vascular sheath. An Omni Flush catheter was inserted over the wire to the level of the distal abdominal aorta. The wire was removed and exchanged for a Glidewire which was then positioned in the right external iliac artery. The Omni Flush catheter was advanced to the level of the common iliac artery. Angiogram was performed from the right common iliac artery which demonstrated wide patency  of the inflow vessels. 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 The Omni Flush catheter was exchanged over a J wire for a 5 French angled tip Kumpe the catheter. The Kumpe catheter was directed to the distal right external iliac artery. Through the Kumpe the catheter, a Lantern microcatheter and Fathom 16 microwire were inserted in used to select the corona mortis. Angiogram was performed from the proximal corona mortis which demonstrated active extravasation arising from a superior pectineal branch. There is also a larger focus of blushing without evidence of active extravasation just to the right word aspect of the pubic symphysis, presumed prostatic branch. The superior pectineal branch was then catheterized. Repeat angiogram demonstrated persistent active extravasation/pseudoaneurysm. Coil embolization was then performed with 2, 1 x 4 mm low-profile Penumbra coils followed by a single 2 x 4 mm coil. The catheter was  retracted slightly and repeat angiogram was performed which demonstrated complete embolization of the superior pectineal branch. The catheter was retracted into the proximal right corona mortis. A Gel-Foam slurry was then administered in until near complete hemostasis. The catheter was flushed and completion angiogram was performed from the proximal right corona mortis without evidence of persistent arterial blushing or active extravasation. The catheters were removed. A 6 French Angio-Seal device was used to close the left common femoral artery arteriotomy site which was deployed successfully. The distal pulses were unchanged. The patient tolerated the procedure well was transferred to the floor in stable condition. IMPRESSION: 1. 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 2. Technically successful coil embolization of the superior pectineal branch artery. 3. Technically successful Gel-Foam embolization of the replaced right obturator artery. Ruthann Cancer, MD [facility] Radiology Electronically Signed   By: Ruthann Cancer M.D.   On: 10/20/2021 16:50   IR EMBO ART  VEN HEMORR LYMPH EXTRAV  INC GUIDE ROADMAPPING  Result Date: 10/20/2021 INDICATION: 75 year old male status post fall with multiple pelvic fractures and pelvic hematoma with active extravasation. EXAM: 1. Ultrasound-guided vascular access of the left common femoral artery. 2. Catheterization angiography of the right common iliac artery, right external iliac artery, anomalous right obturator artery, and obturator pectineal branch artery. 3. Coil embolization of obturator pectineal branch artery. 4. 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 MEDICATIONS: None. ANESTHESIA/SEDATION: Moderate (conscious) sedation was employed during this procedure. A  total of Versed 2 mg and Fentanyl 100 mcg was administered intravenously. Moderate Sedation Time: 39 minutes. The patient's level of consciousness and vital signs were monitored continuously by radiology nursing throughout the procedure under my direct supervision. FLUOROSCOPY TIME:  Fluoroscopy Time: 8.3 minutes (161 mGy). COMPLICATIONS: None immediate. PROCEDURE: Informed consent was obtained from the patient following explanation of the procedure, risks, benefits and alternatives. The patient understands, agrees and consents for the procedure. All questions were addressed. A time out was performed prior to the initiation of the procedure. Maximal barrier sterile technique utilized including caps, mask, sterile gowns, sterile gloves, large sterile drape, hand hygiene, and Betadine prep. The left groin was prepped and draped in standard fashion. Preprocedure ultrasound evaluation demonstrated patency of the left common femoral artery. The procedure was planned. Subdermal Local anesthesia was provided with 1% lidocaine. A small  skin nick was made. Under direct ultrasound visualization, the left common femoral artery was accessed with a 20 gauge micropuncture needle. A permanent image was captured and stored in the record. A micropuncture set was introduced and a limited left lower extremity angiogram was performed which demonstrated adequate puncture site for closure device use. A J wire was inserted over which the micropuncture set was exchanged for a 5 [place], 10 cm vascular sheath. An Omni Flush catheter was inserted over the wire to the level of the distal abdominal aorta. The wire was removed and exchanged for a Glidewire which was then positioned in the right external iliac artery. The Omni Flush catheter was advanced to the level of the common iliac artery. Angiogram was performed from the right common iliac artery which demonstrated wide patency of the inflow vessels. There is a focus of active extravasation  arising just superior to the right pubic ramus which appears to arise from an replaced right obturator artery, otherwise known as corona mortis, originating from the distal right external iliac artery. The Omni Flush catheter was exchanged over a J wire for a 5 French angled tip Kumpe the catheter. The Kumpe catheter was directed to the distal right external iliac artery. Through the Kumpe the catheter, a Lantern microcatheter and Fathom 16 microwire were inserted in used to select the corona mortis. Angiogram was performed from the proximal corona mortis which demonstrated active extravasation arising from a superior pectineal branch. There is also a larger focus of blushing without evidence of active extravasation just to the right word aspect of the pubic symphysis, presumed prostatic branch. The superior pectineal branch was then catheterized. Repeat angiogram demonstrated persistent active extravasation/pseudoaneurysm. Coil embolization was then performed with 2, 1 x 4 mm low-profile Penumbra coils followed by a single 2 x 4 mm coil. The catheter was retracted slightly and repeat angiogram was performed which demonstrated complete embolization of the superior pectineal branch. The catheter was retracted into the proximal right corona mortis. A Gel-Foam slurry was then administered in until near complete hemostasis. The catheter was flushed and completion angiogram was performed from the proximal right corona mortis without evidence of persistent arterial blushing or active extravasation. The catheters were removed. A 6 French Angio-Seal device was used to close the left common femoral artery arteriotomy site which was deployed successfully. The distal pulses were unchanged. The patient tolerated the procedure well was transferred to the floor in stable condition. IMPRESSION: 1. Active extravasation from a superior pectineal branch arising from a replaced right obturator artery stent arising from the distal  right external iliac artery (AKA "corona mortis"). 2. Technically successful coil embolization of the superior pectineal branch artery. 3. Technically successful Gel-Foam embolization of the replaced right obturator artery. Ruthann Cancer, MD [facility] Radiology Electronically Signed   By: Ruthann Cancer M.D.   On: 10/20/2021 16:50        Scheduled Meds:  atorvastatin  40 mg Oral QPM   buPROPion  150 mg Oral QHS   buPROPion  300 mg Oral Daily   Chlorhexidine Gluconate Cloth  6 each Topical Daily   donepezil  5 mg Oral Daily   insulin aspart  0-9 Units Subcutaneous TID WC   memantine  10 mg Oral BID   sodium chloride flush  3 mL Intravenous Q12H   Continuous Infusions:  sodium chloride       LOS: 1 day        Hosie Poisson, MD [facility]  To contact the attending provider between 7A-7P or the covering provider during after hours 7P-7A, please log into the web site [email or web address] and access using universal [place] password for that web site. If you do not have the password, please call the hospital operator.  10/22/2021, 1:22 PM

## 2021-10-22 NOTE — PMR Pre-admission (Shared)
PMR Admission Coordinator Pre-Admission Assessment  Patient: Kevin [name] is an 75 y.o., male MRN: [id number] DOB: Jun 12, 1946 Height: 6' 4"  (193 cm) Weight: 68.4 kg  Insurance Information HMO:     PPO:      PCP:      IPA:      80/20:      OTHER:  [facility]      Policy#: [id number]      Subscriber: self CM Name: Sherlynn Carbon     Phone#: [phone]     Fax#: [phone] Pre-Cert#:       Employer: Retired Benefits:  Phone #: [phone]     Name: Harrell Lark. Date: 04/02/2018     Deduct: $0      Out of Pocket Max: $0      Life Max: N/A CIR: 100%      SNF: 100% Outpatient: 100%     Co-Pay: none Home Health: 100%      Co-Pay: none DME: 100%     Co-Pay: none Providers: in network  SECONDARY: [place] Medicare HMO      Policy#: [id number]     Phone#: self  Financial Counselor:        Phone#:    The Data Collection Information Summary for patients in Inpatient Rehabilitation Facilities with attached Privacy Act [place] Records was provided and verbally reviewed with: Patient and Family  Emergency Contact Information Contact Information     Name Relation Home Work [place] Spouse [phone]  [phone]   Cox,Cheryl Relative   [phone]       Current Medical History  Patient Admitting Diagnosis: Fall, R sup/inf pubic rami fx  History of Present Illness:    Pt is a 75 y.o. male admitted on 10/20/21 after a mechanical fall with hip pain. CT R hip showed superior and inferior pubis ramus fx, right inferior sacrum fracture, and two foci of active bleeding in R hemipelvis. S/p pelvic angiogram and embolization on 12/13. 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    PTA, pt lives with spouse and reports typically Independent with ADLs, IADLs and mobility without AD. Pt reports still driving and  volunteers at [facility] 2 days/week currently.  PT/OT evaluations completed with recommendations for inpatient rehab admission.          Patient's medical record from [facility] has been reviewed by the rehabilitation admission coordinator and physician.  Past Medical History  Past Medical History:  Diagnosis Date   Agent orange exposure    in [place]   Anemia    Heart block AV second degree    a. s/p PPM implant with subsequent CRTD upgrade   High cholesterol    History of colon polyps    LBBB (left bundle branch block)    Nonischemic cardiomyopathy ([place])    a. MDT CRTD upgrade 2016   Pacemaker 08/27/2013   Dual-chamber Medtronic Adapta implanted January 2013 for bradycardia with alternating bundle branch block and 2:1 atrioventricular block    Prostate cancer ([place])    "not been treated yet; on a wait and see" (01/22/2015)   Type II diabetes mellitus ([place])     Has the patient had major surgery during 100 days prior to admission? Yes  Family History   family history includes Cancer in his mother; Heart disease in his father; Leukemia in his father; Ovarian cancer in his  mother.  Current Medications  Current Facility-Administered Medications:    0.9 %  sodium chloride infusion, 250 mL, Intravenous, PRN, Orma Flaming, MD   acetaminophen (TYLENOL) tablet 650 mg, 650 mg, Oral, Q6H PRN, 650 mg at 10/23/21 1635 **OR** acetaminophen (TYLENOL) suppository 650 mg, 650 mg, Rectal, Q6H PRN, Orma Flaming, MD   atorvastatin (LIPITOR) tablet 40 mg, 40 mg, Oral, QPM, Orma Flaming, MD, 40 mg at 10/23/21 1635   buPROPion (WELLBUTRIN XL) 24 hr tablet 150 mg, 150 mg, Oral, QHS, Heloise Purpura, RPH, 150 mg at 10/23/21 2146   buPROPion (WELLBUTRIN XL) 24 hr tablet 300 mg, 300 mg, Oral, Daily, Heloise Purpura, RPH, 300 mg at 10/23/21 8889   Chlorhexidine Gluconate Cloth 2 % PADS 6 each, 6 each, Topical, Daily, Hosie Poisson, MD, 6 each at 10/22/21 0816   donepezil (ARICEPT) tablet 5 mg,  5 mg, Oral, Daily, Orma Flaming, MD, 5 mg at 10/23/21 0833   fluticasone (FLONASE) 50 MCG/ACT nasal spray 1 spray, 1 spray, Each Nare, Daily, Hosie Poisson, MD, 1 spray at 10/23/21 1942   HYDROcodone-acetaminophen (NORCO/VICODIN) 5-325 MG per tablet 1-2 tablet, 1-2 tablet, Oral, Q4H PRN, Orma Flaming, MD, 2 tablet at 10/23/21 1943   insulin aspart (novoLOG) injection 0-9 Units, 0-9 Units, Subcutaneous, TID WC, Orma Flaming, MD, 1 Units at 10/24/21 0645   iohexol (OMNIPAQUE) 300 MG/ML solution 100 mL, 100 mL, Intra-arterial, Once PRN, Suttle, Rosanne Ashing, MD   loratadine (CLARITIN) tablet 10 mg, 10 mg, Oral, Daily, Hosie Poisson, MD, 10 mg at 10/23/21 1635   LORazepam (ATIVAN) tablet 0.5 mg, 0.5 mg, Oral, Q8H PRN, Orma Flaming, MD, 0.5 mg at 10/23/21 2323   memantine [facility]) tablet 10 mg, 10 mg, Oral, BID, Orma Flaming, MD, 10 mg at 10/23/21 2145   ondansetron (ZOFRAN) tablet 4 mg, 4 mg, Oral, Q6H PRN **OR** ondansetron (ZOFRAN) injection 4 mg, 4 mg, Intravenous, Q6H PRN, Orma Flaming, MD, 4 mg at 10/23/21 1635   sodium chloride (OCEAN) 0.65 % nasal spray 1 spray, 1 spray, Each Nare, PRN, Hosie Poisson, MD   sodium chloride flush (NS) 0.9 % injection 3 mL, 3 mL, Intravenous, Q12H, Orma Flaming, MD, 3 mL at 10/23/21 2146   sodium chloride flush (NS) 0.9 % injection 3 mL, 3 mL, Intravenous, PRN, Orma Flaming, MD  Patients Current Diet:  Diet Order             Diet - low sodium heart healthy           Diet Carb Modified Fluid consistency: Thin; Room service appropriate? Yes with Assist  Diet effective now                   Precautions / Restrictions Precautions Precautions: Fall Restrictions Weight Bearing Restrictions: No RLE Weight Bearing: Weight bearing as tolerated   Has the patient had 2 or more falls or a fall with injury in the past year? Yes  Prior Activity Level Community (5-7x/wk): Went out daily, was driving.  Prior Functional Level Self Care: Did the  patient need help bathing, dressing, using the toilet or eating? Independent  Indoor Mobility: Did the patient need assistance with walking from room to room (with or without device)? Independent  Stairs: Did the patient need assistance with internal or external stairs (with or without device)? Independent  Functional Cognition: Did the patient need help planning regular tasks such as shopping or remembering to take medications? Independent  Patient Information Are you of Hispanic, Latino/a,or Spanish origin?:  A. No, not of Hispanic, Latino/a, or Spanish origin What is your race?: A. White Do you need or want an interpreter to communicate with a doctor or health care staff?: 0. No  Patient's Response To:  Health Literacy and Transportation Is the patient able to respond to health literacy and transportation needs?: Yes Health Literacy - How often do you need to have someone help you when you read instructions, pamphlets, or other written material from your doctor or pharmacy?: Rarely In the past 12 months, has lack of transportation kept you from medical appointments or from getting medications?: No In the past 12 months, has lack of transportation kept you from meetings, work, or from getting things needed for daily living?: No  Development worker, international aid / [place] Devices/Equipment: Blood pressure cuff, Walker (specify type) Home Equipment: Other (comment) (wife says they may have RW but unsure)  Prior Device Use: Indicate devices/aids used by the patient prior to current illness, exacerbation or injury? None of the above  Current Functional Level Cognition  Overall Cognitive Status: Impaired/Different from baseline Current Attention Level: Sustained Orientation Level: Oriented to person, Oriented to place, Oriented to time, Disoriented to situation Following Commands: Follows one step commands with increased time, Follows one step commands  consistently Safety/Judgement: Decreased awareness of safety, Decreased awareness of deficits General Comments: Pt requiring cues for initiation, transfer techniques, safety.  Pt occasionally getting words mixed up and making confused statements    Extremity Assessment (includes Sensation/Coordination)  Upper Extremity Assessment: Generalized weakness  Lower Extremity Assessment: Defer to PT evaluation    ADLs  Overall ADL's : Needs assistance/impaired Eating/Feeding: Set up, Sitting Grooming: Supervision/safety, Sitting Upper Body Bathing: Minimal assistance, Sitting Lower Body Bathing: Maximal assistance, Sit to/from stand Upper Body Dressing : Minimal assistance, Sitting Lower Body Dressing: Maximal assistance, Sit to/from stand Lower Body Dressing Details (indicate cue type and reason): able to demo bending to reach top of socks as pt reports this is normally how he gets dressed. will need increased assist in standing to maintain balance/don over waist, as well as sequencing cues Toilet Transfer: Moderate assistance, Stand-pivot, Rolling walker (2 wheels) Toileting- Clothing Manipulation and Hygiene: Maximal assistance, Sit to/from stand General ADL Comments: Pt limited by cognition (frequent cues/redirection needed), as well as pain/impaired balance during activity    Mobility  Overal bed mobility: Needs Assistance Bed Mobility: Supine to Sit, Sit to Supine Supine to sit: HOB elevated, Mod assist Sit to supine: Mod assist, HOB elevated General bed mobility comments: Requiring cues for sequencing ; educated on keeping legs close for decreased pain; min A for legs to EOB and to mod A to lift trunk; For back to bed mod A for bil legs    Transfers  Overall transfer level: Needs assistance Equipment used: Rolling walker (2 wheels) Transfers: Sit to/from Stand Sit to Stand: Mod assist, From elevated surface Bed to/from chair/wheelchair/BSC transfer type:: Step pivot Step pivot  transfers: Mod assist General transfer comment: Sit to stand x 1 with cues for hand placement , assist to initiate, mod A to rise and steady    Ambulation / Gait / Stairs / Wheelchair Mobility  Ambulation/Gait Ambulation/Gait assistance: Herbalist (Feet): 26 Feet Assistive device: Rolling walker (2 wheels) Gait Pattern/deviations: Step-to pattern, Decreased stride length, Shuffle, Trunk flexed General Gait Details: Cues for posture and RW use; pt ambulated to door and back but limited due to nausea Gait velocity: decreased Pre-gait activities: Several marches in place, mod A for  stability. Required a seated rest break prior to ambulating to chair.    Posture / Balance Balance Overall balance assessment: Needs assistance Sitting-balance support: Feet supported, No upper extremity supported Sitting balance-Leahy Scale: Fair Standing balance support: Bilateral upper extremity supported, Reliant on assistive device for balance Standing balance-Leahy Scale: Poor Standing balance comment: Reliant on BUE to offload painful RLE in static standing and mobility, physical assist needed for stability    Special needs/care consideration Continuous Drip IV  KVO, Skin L femoral gauze in place, and Diabetic management Yes, h/o DM, on insulin coverage in acute hospital.   Previous Home Environment (from acute therapy documentation) Living Arrangements: Spouse/significant other  Lives With: Spouse Available Help at Discharge: Family Type of Home: House Home Layout: One level Home Access: Stairs to enter Entrance Stairs-Rails: None Technical brewer of Steps: 2 Bathroom Shower/Tub: Gaffer Home Care Services: No Additional Comments: Wife works part time across the street  Discharge Living Setting Plans for Discharge Living Setting: Patient's home, House, Lives with (comment) (Lives with wife) Type of Home at Discharge: House Discharge Home Layout: Two level, Bed/bath  upstairs, Able to live on main level with bedroom/bathroom Alternate Level Stairs-Number of Steps: Flight Discharge Home Access: Stairs to enter Entrance Stairs-Rails: Right, Left, Can reach both Entrance Stairs-Number of Steps: 4 steps front entry Discharge Bathroom Shower/Tub: Walk-in shower, Door Discharge Bathroom Toilet: Standard Discharge Bathroom Accessibility: Yes How Accessible: Accessible via walker Does the patient have any problems obtaining your medications?: No  Social/Family/Support Systems Patient Roles: Spouse, Parent (Has a wife and 2 grown children) Contact Information: [name] Lachney - wife Anticipated Caregiver: wife Ability/Limitations of Caregiver: , works PT close by Careers adviser: Other (Comment) (Mostly 24/7 supervision, does work Warehouse manager) Discharge Plan Discussed with Primary Caregiver: Yes Is Caregiver In Agreement with Plan?: Yes Does Caregiver/Family have Issues with Lodging/Transportation while Pt is in Rehab?: No  Goals Patient/Family Goal for Rehab: PT/OT mod I and supervision Expected length of stay: 7-10 days Pt/Family Agrees to Admission and willing to participate: Yes Program Orientation Provided & Reviewed with Pt/Caregiver Including Roles  & Responsibilities: Yes  Decrease burden of Care through IP rehab admission: N/A  Possible need for SNF placement upon discharge: Not anticipated  Patient Condition: I have reviewed medical records from [facility], spoken with CM, and patient and spouse. I met with patient at the bedside for inpatient rehabilitation assessment.  Patient will benefit from ongoing PT and OT, can actively participate in 3 hours of therapy a day 5 days of the week, and can make measurable gains during the admission.  Patient will also benefit from the coordinated team approach during an Inpatient Acute Rehabilitation admission.  The patient will receive intensive therapy as well as Rehabilitation physician, nursing, social  worker, and care management interventions.  Due to bladder management, bowel management, safety, skin/wound care, disease management, medication administration, pain management, and patient education the patient requires 24 hour a day rehabilitation nursing.  The patient is currently Min-Mod A with mobility and Min-Max A with basic ADLs.  Discharge setting and therapy post discharge at home with home health is anticipated.  Patient has agreed to participate in the Acute Inpatient Rehabilitation Program and will admit today.  Preadmission Screen Completed By:  Retta Diones with updates by Gretchen Short, 10/24/2021 9:22 AM ______________________________________________________________________   Discussed status with Dr. Letta Pate on 10/24/21  at 9:22 AM  and received approval for admission today.  Admission Coordinator:  Retta Diones, RN  time 9:22 AM/Date 10/24/21    Assessment/Plan: Diagnosis: Does the need for close, 24 hr/day Medical supervision in concert with the patient's rehab needs make it unreasonable for this patient to be served in a less intensive setting? {yes_no_potentially:3041433} Co-Morbidities requiring supervision/potential complications: *** Due to {due YW:3142767}, does the patient require 24 hr/day rehab nursing? {yes_no_potentially:3041433} Does the patient require coordinated care of a physician, rehab nurse, PT, OT, and SLP to address physical and functional deficits in the context of the above medical diagnosis(es)? {yes_no_potentially:3041433} Addressing deficits in the following areas: {deficits:3041436} Can the patient actively participate in an intensive therapy program of at least 3 hrs of therapy 5 days a week? {yes_no_potentially:3041433} The potential for patient to make measurable gains while on inpatient rehab is {potential:3041437} Anticipated functional outcomes upon discharge from inpatient rehab: {functional outcomes:304600100} PT, {functional  outcomes:304600100} OT, {functional outcomes:304600100} SLP Estimated rehab length of stay to reach the above functional goals is: *** Anticipated discharge destination: {anticipated dc setting:21604} 10. Overall Rehab/Functional Prognosis: {potential:3041437}   MD Signature: ***

## 2021-10-22 NOTE — Evaluation (Signed)
 Occupational Therapy Evaluation Patient Details Name: Kevin [name] MRN: [id number] DOB: [birth year] Today's Date: 10/22/2021   History of Present Illness Pt is a 75 y.o. male admitted on 10/20/21 after a mechanical fall with hip pain. CT R hip showed superior and inferior pubis ramus fx, right inferior sacrum fracture, and two foci of active bleeding in R hemipelvis. S/p pelvic angiogram and embolization on 12/13. 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   Clinical Impression   PTA, pt lives with spouse and reports typically Independent with ADLs, IADLs and mobility without AD. 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 Pt presents now with deficits in cognition, strength, standing balance, and endurance. Pt benefits from frequent cues for redirection/sequencing tasks. Pt requires Min A for UB ADLs and up to Max A for LB ADLs due to deficits. Pt overall Mod A for bed mobility and Mod A x 1-2 for basic transfers using RW. [name] present and supportive throughout session, both interested in pursing CIR to decrease fall risk and maximize independence with daily tasks.      Recommendations for follow up therapy are one component of a multi-disciplinary discharge planning process, led by the attending physician.  Recommendations may be updated based on patient status, additional functional criteria and insurance authorization.   Follow Up Recommendations  Acute inpatient rehab (3hours/day)    Assistance Recommended at Discharge Frequent or constant Supervision/Assistance  Functional Status Assessment  Patient has had a recent decline in their functional status and demonstrates the ability to make significant improvements in function in a reasonable and predictable amount of time.  Equipment Recommendations  BSC/3in1;Other (comment) (Rolling  walker)    Recommendations for Other Services Rehab consult     Precautions / Restrictions Precautions Precautions: Fall Restrictions Weight Bearing Restrictions: Yes RLE Weight Bearing: Weight bearing as tolerated      Mobility Bed Mobility Overal bed mobility: Needs Assistance Bed Mobility: Supine to Sit     Supine to sit: Mod assist;HOB elevated     General bed mobility comments: pt reaching to raise HOB prior to initiating bed mobility, Assist to get B LE fully off of bed and lift trunk via handheld assist    Transfers Overall transfer level: Needs assistance Equipment used: Rolling walker (2 wheels) Transfers: Sit to/from Stand;Bed to chair/wheelchair/BSC Sit to Stand: Mod assist;From elevated surface     Step pivot transfers: Mod assist;+2 safety/equipment     General transfer comment: Mod A to lift bottom and gain upright posture with frequent reminders needed for hand placement. Mod A x 2 (for safety, wife present to assist with pt confidence) with assist needed to manuever RW and step by step cueing. Encouraged pt to push through BUE on RW to offload painful LE      Balance Overall balance assessment: Needs assistance Sitting-balance support: Single extremity supported;Feet supported Sitting balance-Leahy Scale: Fair     Standing balance support: Bilateral upper extremity supported;Reliant on assistive device for balance Standing balance-Leahy Scale: Poor Standing balance comment: Reliant on BUE to offload painful RLE in static standing and mobility, physical assist needed for stability                           ADL either performed or assessed with clinical judgement   ADL Overall ADL's : Needs assistance/impaired Eating/Feeding: Set up;Sitting  Grooming: Supervision/safety;Sitting   Upper Body Bathing: Minimal assistance;Sitting   Lower Body Bathing: Maximal assistance;Sit to/from stand   Upper Body Dressing : Minimal  assistance;Sitting   Lower Body Dressing: Maximal assistance;Sit to/from stand Lower Body Dressing Details (indicate cue type and reason): able to demo bending to reach top of socks as pt reports this is normally how he gets dressed. will need increased assist in standing to maintain balance/don over waist, as well as sequencing cues Toilet Transfer: Moderate assistance;Stand-pivot;Rolling walker (2 wheels)   Toileting- Clothing Manipulation and Hygiene: Maximal assistance;Sit to/from stand         General ADL Comments: Pt limited by cognition (frequent cues/redirection needed), as well as pain/impaired balance during activity     Vision Baseline Vision/History: 1 Wears glasses Ability to See in Adequate Light: 1 Impaired Patient Visual Report: No change from baseline Vision Assessment?: No apparent visual deficits     Perception     Praxis      Pertinent Vitals/Pain Pain Assessment: 0-10 Pain Score: 7  Pain Location: R hip Pain Descriptors / Indicators: Grimacing;Sore Pain Intervention(s): Monitored during session;Patient requesting pain meds-RN notified;Limited activity within patient's tolerance;Repositioned     Hand Dominance Right   Extremity/Trunk Assessment Upper Extremity Assessment Upper Extremity Assessment: Generalized weakness   Lower Extremity Assessment Lower Extremity Assessment: Defer to PT evaluation   Cervical / Trunk Assessment Cervical / Trunk Assessment: Kyphotic   Communication Communication Communication: No difficulties   Cognition Arousal/Alertness: Awake/alert Behavior During Therapy: WFL for tasks assessed/performed Overall Cognitive Status: Impaired/Different from baseline Area of Impairment: Safety/judgement;Problem solving;Attention;Orientation;Memory;Following commands;Awareness                 Orientation Level: Disoriented to;Time Current Attention Level: Sustained Memory: Decreased short-term memory Following Commands:  Follows one step commands with increased time;Follows one step commands consistently Safety/Judgement: Decreased awareness of safety Awareness: Intellectual Problem Solving: Slow processing;Difficulty sequencing;Requires verbal cues;Requires tactile cues General Comments: Pt with intermittent confusion noted with wife assisting with redirection as needed during session. Pt got days mixed up, thought today was his volunteer day and he needed to call to tell them why he wasnt there, also reported plan to DC today though OT/wife educated likely not planned for today. requires cues to redirect to task and repetition to follow directions at times for hand placement     General Comments  BP WFL sitting and after transfer, 150s/70s. Wife present throughout and supportive    Exercises     Shoulder Instructions      Home Living Family/patient expects to be discharged to:: Private residence Living Arrangements: Spouse/significant other Available Help at Discharge: Family Type of Home: House Home Access: Stairs to enter Technical  of Steps: 2 Entrance Stairs-Rails: None Home Layout: One level     Bathroom Shower/Tub: Walk-in shower         Home Equipment: Other (comment) (wife says they may have RW but unsure)   Additional Comments: Wife works part time across the street      Prior Functioning/Environment Prior Level of Function : Independent/Modified Independent;Driving;Working/employed             Mobility Comments: Ambulated without DME. ADLs Comments: Independent with ADLs, IADLs, cares for small dogs, active during the day and volunteers at [facility] Wednesdays and Fridays. Driving to/from [facility] for volunteering        OT Problem List: Decreased strength;Decreased activity tolerance;Impaired balance (sitting and/or standing);Decreased cognition;Decreased knowledge of precautions;Decreased knowledge of use of DME or AE;Decreased safety  awareness;Pain      OT  Treatment/Interventions: Self-care/ADL training;Therapeutic exercise;Energy conservation;DME and/or AE instruction;Therapeutic activities;Patient/family education;Balance training    OT Goals(Current goals can be found in the care plan section) Acute Rehab OT Goals Patient Stated Goal: decrease pain with movement, be able to return to normal activities OT Goal Formulation: With patient/family Time For Goal Achievement: 11/05/21 Potential to Achieve Goals: Good ADL Goals Pt Will Perform Grooming: standing;with set-up Pt Will Perform Lower Body Bathing: sit to/from stand;with supervision Pt Will Perform Lower Body Dressing: sit to/from stand;with supervision Pt Will Transfer to Toilet: with min guard assist;ambulating  OT Frequency: Min 2X/week   Barriers to D/C:            Co-evaluation              AM-PAC OT "6 Clicks" Daily Activity     Outcome Measure Help from another person eating meals?: A Little Help from another person taking care of personal grooming?: A Little Help from another person toileting, which includes using toliet, bedpan, or urinal?: A Lot Help from another person bathing (including washing, rinsing, drying)?: A Lot Help from another person to put on and taking off regular upper body clothing?: A Little Help from another person to put on and taking off regular lower body clothing?: A Lot 6 Click Score: 15   End of Session Equipment Utilized During Treatment: Gait belt;Rolling walker (2 wheels) Nurse Communication: Mobility status  Activity Tolerance: Patient tolerated treatment well Patient left: in chair;with call bell/phone within reach;with family/visitor present;Other (comment) (attempted setting chair alarm x 2 though kept alarming despite pt not moving - notifed RN/NT)  OT Visit Diagnosis: Unsteadiness on feet (R26.81);Other abnormalities of gait and mobility (R26.89);Muscle weakness (generalized) (M62.81);Other symptoms and signs involving cognitive  function;Pain Pain - Right/Left: Right Pain - part of body: Hip                Time: 8144-8185 OT Time Calculation (min): 25 min Charges:  OT General Charges $OT Visit: 1 Visit OT Evaluation $OT Eval Moderate Complexity: 1 Mod OT Treatments $Therapeutic Activity: 8-22 mins  Malachy Chamber, OTR/L Acute Rehab Services Office: [phone]   Layla Maw 10/22/2021, 8:50 AM

## 2021-10-23 LAB — GLUCOSE, CAPILLARY
Glucose-Capillary: 149 mg/dL — ABNORMAL HIGH (ref 70–99)
Glucose-Capillary: 162 mg/dL — ABNORMAL HIGH (ref 70–99)
Glucose-Capillary: 136 mg/dL — ABNORMAL HIGH (ref 70–99)
Glucose-Capillary: 130 mg/dL — ABNORMAL HIGH (ref 70–99)

## 2021-10-23 MED ORDER — LORATADINE 10 MG PO TABS
10.0000 mg | ORAL_TABLET | Freq: Every day | ORAL | Status: DC
  Administered 2021-10-23 – 2021-10-24 (×2): 10 mg via ORAL
  Filled 2021-10-23 (×2): qty 1

## 2021-10-23 MED ORDER — SALINE SPRAY 0.65 % NA SOLN
1.0000 | NASAL | Status: DC | PRN
  Filled 2021-10-23: qty 44

## 2021-10-23 MED ORDER — FLUTICASONE PROPIONATE 50 MCG/ACT NA SUSP
1.0000 | Freq: Every day | NASAL | Status: DC
  Administered 2021-10-23 – 2021-10-24 (×2): 1 via NASAL
  Filled 2021-10-23: qty 16

## 2021-10-23 MED ORDER — CLOPIDOGREL BISULFATE 75 MG PO TABS
75.0000 mg | ORAL_TABLET | Freq: Every day | ORAL | Status: DC
Start: 2021-10-30 — End: 2021-11-02

## 2021-10-23 NOTE — Progress Notes (Signed)
Physical Therapy Treatment Patient Details Name: Kevin [name] MRN: [id number] DOB: 1946/04/20 Today's Date: 10/23/2021   History of Present Illness Pt is a 75 y.o. male admitted on 10/20/21 after a mechanical fall with hip pain. CT R hip showed superior and inferior pubis ramus fx, right inferior sacrum fracture, and two foci of active bleeding in R hemipelvis. S/p pelvic angiogram and embolization on 12/13. 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    PT Comments    Pt with mild confusion requiring mulitmodal cues throughout session.  He ambulated short distance in room but was limited due to nausea, pelvic pain, and headache (RN aware and checking on meds).  Continue plan of care.    Recommendations for follow up therapy are one component of a multi-disciplinary discharge planning process, led by the attending physician.  Recommendations may be updated based on patient status, additional functional criteria and insurance authorization.  Follow Up Recommendations  Acute inpatient rehab (3hours/day)     Assistance Recommended at Discharge Frequent or constant Supervision/Assistance  Equipment Recommendations  Rolling walker (2 wheels)    Recommendations for Other Services       Precautions / Restrictions Precautions Precautions: Fall     Mobility  Bed Mobility Overal bed mobility: Needs Assistance Bed Mobility: Supine to Sit;Sit to Supine     Supine to sit: HOB elevated;Mod assist Sit to supine: Mod assist;HOB elevated   General bed mobility comments: Requiring cues for sequencing ; educated on keeping legs close for decreased pain; min A for legs to EOB and to mod A to lift trunk; For back to bed mod A for bil legs    Transfers Overall transfer level: Needs assistance Equipment used: Rolling walker (2 wheels) Transfers: Sit to/from Stand Sit to  Stand: Mod assist;From elevated surface           General transfer comment: Sit to stand x 1 with cues for hand placement , assist to initiate, mod A to rise and steady    Ambulation/Gait Ambulation/Gait assistance: Min assist Gait Distance (Feet): 26 Feet Assistive device: Rolling walker (2 wheels) Gait Pattern/deviations: Step-to pattern;Decreased stride length;Shuffle;Trunk flexed Gait velocity: decreased     General Gait Details: Cues for posture and RW use; pt ambulated to door and back but limited due to nausea   Stairs             Wheelchair Mobility    Modified Rankin (Stroke Patients Only)       Balance Overall balance assessment: Needs assistance Sitting-balance support: Feet supported;No upper extremity supported Sitting balance-Leahy Scale: Fair     Standing balance support: Bilateral upper extremity supported;Reliant on assistive device for balance Standing balance-Leahy Scale: Poor Standing balance comment: Reliant on BUE to offload painful RLE in static standing and mobility, physical assist needed for stability                            Cognition Arousal/Alertness: Awake/alert Behavior During Therapy: WFL for tasks assessed/performed Overall Cognitive Status: Impaired/Different from baseline Area of Impairment: Safety/judgement;Problem solving;Attention;Orientation;Memory;Following commands;Awareness                 Orientation Level: Disoriented to;Time;Situation Current Attention Level: Sustained Memory: Decreased short-term memory Following Commands: Follows one step commands with increased time;Follows one step commands consistently Safety/Judgement: Decreased awareness of safety;Decreased awareness of deficits Awareness: Emergent  Problem Solving: Slow processing;Difficulty sequencing;Requires verbal cues;Decreased initiation General Comments: Pt requiring cues for initiation, transfer techniques, safety.  Pt  occasionally getting words mixed up and making confused statements        Exercises      General Comments General comments (skin integrity, edema, etc.): HR up to 117 bpm with gait otherwise VSS      Pertinent Vitals/Pain Pain Assessment: Faces Faces Pain Scale: Hurts whole lot (with mobility) Pain Location: R hip with mobility; Head ache - throbbing Pain Descriptors / Indicators: Discomfort;Grimacing;Sharp;Throbbing Pain Intervention(s): Limited activity within patient's tolerance;Monitored during session;Patient requesting pain meds-RN notified;Repositioned    Home Living                          Prior Function            PT Goals (current goals can now be found in the care plan section) Progress towards PT goals: Progressing toward goals    Frequency    Min 5X/week      PT Plan Current plan remains appropriate    Co-evaluation              AM-PAC PT "6 Clicks" Mobility   Outcome Measure  Help needed turning from your back to your side while in a flat bed without using bedrails?: A Lot Help needed moving from lying on your back to sitting on the side of a flat bed without using bedrails?: A Lot Help needed moving to and from a bed to a chair (including a wheelchair)?: A Lot Help needed standing up from a chair using your arms (e.g., wheelchair or bedside chair)?: A Lot Help needed to walk in hospital room?: Total Help needed climbing 3-5 steps with a railing? : Total 6 Click Score: 10    End of Session Equipment Utilized During Treatment: Gait belt Activity Tolerance: Patient tolerated treatment well Patient left: in bed;with bed alarm set;with call bell/phone within reach;with family/visitor present Nurse Communication: Mobility status PT Visit Diagnosis: Unsteadiness on feet (R26.81);Other abnormalities of gait and mobility (R26.89);History of falling (Z91.81);Muscle weakness (generalized) (M62.81);Pain Pain - Right/Left: Right Pain - part  of body: Hip     Time: 8416-6063 PT Time Calculation (min) (ACUTE ONLY): 19 min  Charges:  $Gait Training: 8-22 mins                     Abran Richard, PT Acute Rehab Services Pager [phone] [facility] 10/23/2021, 4:11 PM

## 2021-10-23 NOTE — Progress Notes (Signed)
Mobility Specialist: Progress Note   10/23/21 1013  Mobility  Activity Ambulated in room  Level of Assistance Minimal assist, patient does 75% or more  Assistive Device Front wheel walker  Distance Ambulated (ft) 20 ft  Mobility Ambulated with assistance in room  Mobility Response Tolerated well  Mobility performed by Mobility specialist  Bed Position Chair  $Mobility charge 1 Mobility   Pre-Mobility: 90 HR, 99% SpO2 Post-Mobility: 99 HR  Pt required minA to sit EOB as well as to stand. Pt had antalgic gait he attributed to R hip pain when moving, otherwise no c/o. Pt to the recliner per request with call bell present in the room and family member present in the room.   [facility] Zebedee Segundo Mobility Specialist Mobility Specialist [address] [place]: [phone] Mobility Specialist [address] [place] [address] [place]: [phone]

## 2021-10-23 NOTE — H&P (Incomplete)
Physical Medicine and Rehabilitation Admission H&P    Chief Complaint  Patient presents with   Fall   Cold Exposure  : HPI: Kevin [name]. Mccowen is a 75 year old right-handed male with history of left bundle branch heart block status post pacemaker 2013 maintained on Plavix/nonischemic cardiomyopathy/chronic combined systolic diastolic congestive heart failure, hyperlipidemia, type 2 diabetes mellitus, prostate cancer, depression/memory loss maintained on Aricept/Namenda quit smoking 37 years ago.  Per chart review patient lives with spouse.  1 level home 2 steps to entry.  Modified independent and driving prior to admission.  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  Wife works part-time.  Presented 10/20/2021 after mechanical fall without loss of conscious.  He denied any chest pain or shortness of breath associated with the fall.  Cranial CT scan and cervical spine films negative.  CT of the right hip as well as CT angiography of abdomen and pelvis showed mildly displaced right superior and inferior pubic ramus fracture.  There was also a large hematoma within the right hemipelvis with adjacent fluid stranding measuring 10.9 x 5.8 x 8.3 cm.  As well as 2 foci of active bleeding in the right hemipelvis largest focus of bleeding along the right anterior aspect of the prostate.  Small focus of bleeding near the right superior pubic ramus.  There were areas of bleeding appeared to be supplied by a corona mortise branch that originates from the distal right external iliac artery.  Incidental findings of nodules left lung base largest measuring 3 mm no follow-up was needed patient considered low risk as no known or suspected primary neoplasm.  Patient underwent coil embolization of right pectineal branch artery per interventional radiology.  Gelfoam embolization of the replaced right obturator/corona mortise artery.  Orthopedic follow-up in regards to pubic ramus fracture weightbearing as  tolerated.  Hemoglobin remained stable at 10.4.  Patient with initial urinary retention possibly from pelvic hematoma Foley catheter tube removed for voiding trial and monitored.  Renal ultrasound unremarkable.  Therapy evaluations completed due to patient decreased functional mobility was admitted for a comprehensive rehab program.  Review of Systems  Constitutional:  Negative for chills and fever.  HENT:  Negative for hearing loss.   Eyes:  Negative for blurred vision and double vision.  Respiratory:  Negative for cough and shortness of breath.   Cardiovascular:  Negative for chest pain, palpitations and leg swelling.  Gastrointestinal:  Positive for constipation. Negative for heartburn, nausea and vomiting.  Genitourinary:  Positive for urgency. Negative for dysuria, flank pain and hematuria.  Musculoskeletal:  Positive for falls, joint pain and myalgias.  Skin:  Negative for rash.  Neurological:        Intermittent bouts of dizziness  Psychiatric/Behavioral:  Positive for depression and memory loss.   All other systems reviewed and are negative. Past Medical History:  Diagnosis Date   Agent orange exposure    in [place]   Anemia    Heart block AV second degree    a. s/p PPM implant with subsequent CRTD upgrade   High cholesterol    History of colon polyps    LBBB (left bundle branch block)    Nonischemic cardiomyopathy ([place])    a. MDT CRTD upgrade 2016   Pacemaker 08/27/2013   Dual-chamber Medtronic Adapta implanted January 2013 for bradycardia with alternating bundle branch block and 2:1 atrioventricular block    Prostate cancer ([place])    "not been treated yet; on a wait and see" (01/22/2015)  Type II diabetes mellitus ([place])    Past Surgical History:  Procedure Laterality Date   BASAL CELL CARCINOMA EXCISION     "back, face, neck"   BI-VENTRICULAR IMPLANTABLE CARDIOVERTER DEFIBRILLATOR UPGRADE N/A 01/22/2015   MDT CRTD upgrade by Dr Rayann Heman   BIV ICD GENERATOR CHANGEOUT N/A  08/28/2020   Procedure: BIV ICD GENERATOR CHANGEOUT;  Surgeon: [name], MD;  Location: [place] [facility];  Service: Cardiovascular;  Laterality: N/A;   CARDIAC CATHETERIZATION  01/27/2012   normal coronaries, dilated LV c/w nonischemic cardiomyopathy   EXAMINATION UNDER ANESTHESIA  04/06/2012   Procedure: EXAM UNDER ANESTHESIA;  Surgeon: Marcello Moores A. Cornett, MD;  Location: [facility];  Service: General;  Laterality: N/A;   INCISION AND DRAINAGE PERIRECTAL ABSCESS  ~ 2010; 2015   INGUINAL HERNIA REPAIR Left 1980   IR ANGIOGRAM PELVIS SELECTIVE OR SUPRASELECTIVE  10/20/2021   IR ANGIOGRAM VISCERAL SELECTIVE  10/20/2021   IR EMBO ART  VEN HEMORR LYMPH EXTRAV  INC GUIDE ROADMAPPING  10/20/2021   IR US GUIDE VASC ACCESS LEFT  10/20/2021   LEFT HEART CATHETERIZATION WITH CORONARY ANGIOGRAM N/A 01/27/2012   Procedure: LEFT HEART CATHETERIZATION WITH CORONARY ANGIOGRAM;  Surgeon: Troy Sine, MD;  Location[facility];  Service: Cardiovascular;  Laterality: N/A;   NM MYOCAR PERF WALL MOTION  01/21/2012   mod-severe perfusion defect due to infarct/scar w/mild perinfarct ischemia in the apical,basal inferoseptal,basal inferior,mid inferoseptal,midinferior and apical inferior regions   pacemaker battery     PERMANENT PACEMAKER INSERTION N/A 12/01/2011   Medtronic implanted by Dr Sallyanne Kuster   PROSTATE BIOPSY     PROSTATE BIOPSY     shrapnel surgery     "in [place]"   TONSILLECTOMY     Family History  Problem Relation Age of Onset   Ovarian cancer Mother    Cancer Mother        ?uterine   Leukemia Father    Heart disease Father    Diabetes Neg Hx    Social History:  reports that he quit smoking about 37 years ago. His smoking use included cigarettes. He has a 20.00 pack-year smoking history. He has never used smokeless tobacco. He reports current alcohol use. He reports that he does not use drugs. Allergies:  Allergies  Allergen Reactions   Sulfa Antibiotics Swelling    Swelling of lips only.    5-Alpha Reductase Inhibitors    Medications Prior to Admission  Medication Sig Dispense Refill   acetaminophen (TYLENOL) 500 MG tablet Take 500 mg by mouth every 6 (six) hours as needed for moderate pain or headache.     atorvastatin (LIPITOR) 80 MG tablet Take 40 mg by mouth every evening.     buPROPion (WELLBUTRIN XL) 150 MG 24 hr tablet Take 2 tablets in AM, 1 tablet in PM (Patient taking differently: Take 150-300 mg by mouth See admin instructions. 300 mg in the morning  150 mg at bedtime) 270 tablet 3   Carboxymethylcellulose Sodium (EYE DROPS OP) Place 1 drop into both eyes 3 (three) times daily. Given by [facility] doctor to help astigmtism     cholecalciferol (VITAMIN D) 25 MCG (1000 UNIT) tablet Take 1,000 Units by mouth daily.     clobetasol ointment (TEMOVATE) 2.20 % Apply 1 application topically daily as needed (itching).      donepezil (ARICEPT) 5 MG tablet Take 1 tablet (5 mg total) by mouth daily. 90 tablet 3   Empagliflozin-metFORMIN HCl ER (SYNJARDY XR) 03-999 MG TB24 Take 2  tablets by mouth daily. (Patient taking differently: Take 0.5 tablets by mouth 2 (two) times daily.) 60 tablet 2   LORazepam (ATIVAN) 0.5 MG tablet Take 0.5 mg by mouth every 8 (eight) hours as needed for anxiety.      memantine (NAMENDA) 10 MG tablet Take 1 tablet twice a day (Patient taking differently: Take 10 mg by mouth 2 (two) times daily.) 180 tablet 3   promethazine (PHENERGAN) 12.5 MG tablet Take 1 tablet (12.5 mg total) by mouth every 6 (six) hours as needed for nausea or vomiting. 30 tablet 0   VIAGRA 100 MG tablet TAKE ONE TABLET BY MOUTH AS NEEDED FOR ERECTILE DYSFUNCTION. (Patient taking differently: Take 50 mg by mouth as needed for erectile dysfunction.) 10 tablet 3   [DISCONTINUED] clopidogrel (PLAVIX) 75 MG tablet TAKE 1 TABLET(75 MG) BY MOUTH DAILY (Patient taking differently: Take 75 mg by mouth daily.) 90 tablet 3   ACCU-CHEK COMPACT PLUS test strip use 1 TEST STRIP once daily as directed 102  each 5   Continuous Blood Gluc Receiver (FREESTYLE LIBRE 14 DAY READER) DEVI 1 each by Does not apply route See admin instructions. Use Freestyle Libre Reader to monitor blood sugars. 1 each 0   Continuous Blood Gluc Sensor (FREESTYLE LIBRE 14 DAY SENSOR) MISC CHANGE EVERY 14 DAYS AS DIRECTED OR NEEDED 6 each 2    Drug Regimen Review Drug regimen was reviewed and remains appropriate with no significant issues identified  Home: Home Living Family/patient expects to be discharged to:: Private residence Living Arrangements: Spouse/significant other Available Help at Discharge: Family Type of Home: House Home Access: Stairs to enter Technical brewer of Steps: 2 Entrance Stairs-Rails: None Home Layout: One level Bathroom Shower/Tub: Insurance claims handler: Other (comment) (wife says they may have RW but unsure) Additional Comments: Wife works part time across the street  Lives With: Spouse   Functional History: Prior Function Prior Level of Function : Independent/Modified Independent, Driving, Working/employed Mobility Comments: Ambulated without DME. 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 Driving to/from [facility] for volunteering  Functional Status:  Mobility: Bed Mobility Overal bed mobility: Needs Assistance Bed Mobility: Supine to Sit, Sit to Supine Supine to sit: HOB elevated, Supervision Sit to supine: Supervision General bed mobility comments: able to progress BLE and elevate trunk using bedrails and increased time Transfers Overall transfer level: Needs assistance Equipment used: Rolling walker (2 wheels) Transfers: Sit to/from Stand, Bed to chair/wheelchair/BSC Sit to Stand: Mod assist Bed to/from chair/wheelchair/BSC transfer type:: Step pivot Step pivot transfers: Mod assist General transfer comment: Stood 4x from recliner and once from EOB, heavy use of UE. Mod  A for elevation, stability, and to gain upright posture. Cues for hand placement Ambulation/Gait Ambulation/Gait assistance: Min assist, Mod assist, +2 safety/equipment Gait Distance (Feet): 20 Feet (+18, +4) Assistive device: Rolling walker (2 wheels) Gait Pattern/deviations: Step-to pattern, Decreased stance time - right, Decreased weight shift to right, Trunk flexed, Antalgic, Knees buckling General Gait Details: Up to Heavy mod A for stability (+2 for chair follow), cues for RW management, pacing, and self-monitoring pain/fatigue. Pt did not request rest break, despite R knee starting to buckle. Gait velocity: decreased Pre-gait activities: Several marches in place, mod A for stability. Required a seated rest break prior to ambulating to chair.    ADL: ADL Overall ADL's : Needs assistance/impaired Eating/Feeding: Set up, Sitting Grooming: Supervision/safety, Sitting Upper Body Bathing: Minimal assistance, Sitting  Lower Body Bathing: Maximal assistance, Sit to/from stand Upper Body Dressing : Minimal assistance, Sitting Lower Body Dressing: Maximal assistance, Sit to/from stand Lower Body Dressing Details (indicate cue type and reason): able to demo bending to reach top of socks as pt reports this is normally how he gets dressed. will need increased assist in standing to maintain balance/don over waist, as well as sequencing cues Toilet Transfer: Moderate assistance, Stand-pivot, Rolling walker (2 wheels) Toileting- Clothing Manipulation and Hygiene: Maximal assistance, Sit to/from stand General ADL Comments: Pt limited by cognition (frequent cues/redirection needed), as well as pain/impaired balance during activity  Cognition: Cognition Overall Cognitive Status: Impaired/Different from baseline Orientation Level: Oriented X4 Cognition Arousal/Alertness: Awake/alert Behavior During Therapy: WFL for tasks assessed/performed Overall Cognitive Status: Impaired/Different from  baseline Area of Impairment: Safety/judgement, Problem solving, Attention, Orientation, Memory, Following commands, Awareness Orientation Level: Disoriented to, Time, Situation Current Attention Level: Sustained Memory: Decreased short-term memory Following Commands: Follows one step commands with increased time, Follows one step commands consistently Safety/Judgement: Decreased awareness of safety, Decreased awareness of deficits Awareness: Intellectual Problem Solving: Slow processing, Difficulty sequencing, Requires verbal cues General Comments: Pt got days mixed up, thought today was his volunteer day. Pt said he hadn't broken anything when he fell, when he has mutliple fractures. Unable to determine when he needs a rest break despite cues.  Physical Exam: Blood pressure (!) 150/72, pulse 85, temperature (!) 97.5 F (36.4 C), temperature source Oral, resp. rate 20, height 6\' 4"  (1.93 m), weight 72.6 kg, SpO2 96 %. Physical Exam  Results for orders placed or performed during the hospital encounter of 10/20/21 (from the past 48 hour(s))  Glucose, capillary     Status: Abnormal   Collection Time: 10/21/21 12:14 PM  Result Value Ref Range   Glucose-Capillary 177 (H) 70 - 99 mg/dL    Comment: Glucose reference range applies only to samples taken after fasting for at least 8 hours.  Vitamin B12     Status: None   Collection Time: 10/21/21  1:54 PM  Result Value Ref Range   Vitamin B-12 306 180 - 914 pg/mL    Comment: (NOTE) This assay is not validated for testing neonatal or myeloproliferative syndrome specimens for Vitamin B12 levels. Performed at [facility], [place] [address], [place], [place] [address]   Folate     Status: None   Collection Time: 10/21/21  1:54 PM  Result Value Ref Range   Folate 11.0 >5.9 ng/mL    Comment: Performed at [facility], [place] [address] [place], [place] [address]  Iron and TIBC     Status: None   Collection Time: 10/21/21  1:54 PM   Result Value Ref Range   Iron 86 45 - 182 ug/dL   TIBC 287 250 - 450 ug/dL   Saturation Ratios 30 17.9 - 39.5 %   UIBC 201 ug/dL    Comment: Performed at [facility] [address], [place], [place] [address]  Ferritin     Status: None   Collection Time: 10/21/21  1:54 PM  Result Value Ref Range   Ferritin 108 24 - 336 ng/mL    Comment: Performed at [facility], [place] [address], [place], [place] [address]  Glucose, capillary     Status: Abnormal   Collection Time: 10/21/21  4:33 PM  Result Value Ref Range   Glucose-Capillary 156 (H) 70 - 99 mg/dL    Comment: Glucose reference range applies only to  samples taken after fasting for at least 8 hours.  Glucose, capillary     Status: Abnormal   Collection Time: 10/21/21  9:08 PM  Result Value Ref Range   Glucose-Capillary 111 (H) 70 - 99 mg/dL    Comment: Glucose reference range applies only to samples taken after fasting for at least 8 hours.  Hemoglobin A1c     Status: Abnormal   Collection Time: 10/22/21  3:59 AM  Result Value Ref Range   Hgb A1c MFr Bld 6.7 (H) 4.8 - 5.6 %    Comment: (NOTE) Pre diabetes:          5.7%-6.4%  Diabetes:              >6.4%  Glycemic control for   <7.0% adults with diabetes    Mean Plasma Glucose 145.59 mg/dL    Comment: Performed at [facility] [address] [place], [place] [address]  CBC     Status: Abnormal   Collection Time: 10/22/21  3:59 AM  Result Value Ref Range   WBC 7.7 4.0 - 10.5 K/uL   RBC 3.15 (L) 4.22 - 5.81 MIL/uL   Hemoglobin 10.4 (L) 13.0 - 17.0 g/dL   HCT 32.0 (L) 39.0 - 52.0 %   MCV 101.6 (H) 80.0 - 100.0 fL   MCH 33.0 26.0 - 34.0 pg   MCHC 32.5 30.0 - 36.0 g/dL   RDW 12.5 11.5 - 15.5 %   Platelets 99 (L) 150 - 400 K/uL    Comment: Immature Platelet Fraction may be clinically indicated, consider ordering this additional test [id number] REPEATED TO VERIFY PLATELET COUNT CONFIRMED BY SMEAR    nRBC 0.0 0.0 - 0.2 %    Comment: Performed at [facility], [place] [address] [place], [place] [address]  Basic metabolic panel     Status: Abnormal   Collection Time: 10/22/21  3:59 AM  Result Value Ref Range   Sodium 138 135 - 145 mmol/L   Potassium 4.7 3.5 - 5.1 mmol/L   Chloride 105 98 - 111 mmol/L   CO2 28 22 - 32 mmol/L   Glucose, Bld 130 (H) 70 - 99 mg/dL    Comment: Glucose reference range applies only to samples taken after fasting for at least 8 hours.   BUN 12 8 - 23 mg/dL   Creatinine, Ser 1.39 (H) 0.61 - 1.24 mg/dL   Calcium 8.5 (L) 8.9 - 10.3 mg/dL   GFR, Estimated 53 (L) >60 mL/min    Comment: (NOTE) Calculated using the CKD-EPI Creatinine Equation (2021)    Anion gap 5 5 - 15    Comment: Performed at [facility] [address], [place], [place] [address]  Glucose, capillary     Status: Abnormal   Collection Time: 10/22/21  6:14 AM  Result Value Ref Range   Glucose-Capillary 125 (H) 70 - 99 mg/dL    Comment: Glucose reference range applies only to samples taken after fasting for at least 8 hours.  Glucose, capillary     Status: Abnormal   Collection Time: 10/22/21 12:33 PM  Result Value Ref Range   Glucose-Capillary 152 (H) 70 - 99 mg/dL    Comment: Glucose reference range applies only to samples taken after fasting for at least 8 hours.   Comment 1 Notify RN    Comment 2 Document in Chart   Urinalysis, Routine w reflex microscopic Urine, Catheterized     Status: Abnormal   Collection Time:  10/22/21 12:48 PM  Result Value Ref Range   Color, Urine AMBER (A) YELLOW    Comment: BIOCHEMICALS MAY BE AFFECTED BY COLOR   APPearance CLOUDY (A) CLEAR   Specific Gravity, Urine 1.015 1.005 - 1.030   pH 7.5 5.0 - 8.0   Glucose, UA 250 (A) NEGATIVE mg/dL   Hgb urine dipstick LARGE (A) NEGATIVE   Bilirubin Urine SMALL (A) NEGATIVE   Ketones, ur NEGATIVE NEGATIVE mg/dL   Protein, ur 30 (A) NEGATIVE mg/dL   Nitrite NEGATIVE NEGATIVE   Leukocytes,Ua TRACE (A) NEGATIVE    Comment: Performed at [facility] [address] [place], [place] [address]  Urinalysis, Microscopic (reflex)     Status: Abnormal   Collection Time: 10/22/21 12:48 PM  Result Value Ref Range   RBC / HPF >50 0 - 5 RBC/hpf   WBC, UA 6-10 0 - 5 WBC/hpf   Bacteria, UA RARE (A) NONE SEEN   Squamous Epithelial / LPF NONE SEEN 0 - 5   Mucus PRESENT     Comment: Performed at [facility], [place] [address], [place], [place] [address]  Glucose, capillary     Status: Abnormal   Collection Time: 10/22/21  4:38 PM  Result Value Ref Range   Glucose-Capillary 173 (H) 70 - 99 mg/dL    Comment: Glucose reference range applies only to samples taken after fasting for at least 8 hours.   Comment 1 Notify RN    Comment 2 Document in Chart   Glucose, capillary     Status: Abnormal   Collection Time: 10/23/21  6:07 AM  Result Value Ref Range   Glucose-Capillary 149 (H) 70 - 99 mg/dL    Comment: Glucose reference range applies only to samples taken after fasting for at least 8 hours.   US RENAL  Result Date: 10/22/2021 CLINICAL DATA:  Hematuria EXAM: RENAL / URINARY TRACT ULTRASOUND COMPLETE COMPARISON:  CT 10/20/2021 FINDINGS: Right Kidney: Renal measurements: 10.5 x 5.3 x 4.0 cm = volume: 115.2 mL. Echogenicity within normal limits. No mass or hydronephrosis visualized. Left Kidney: Renal measurements: 10.7 x 5.4 x 4 cm = volume: 121.3 mL. Echogenicity within normal limits. No hydronephrosis. Cyst at the upper pole measuring 20 mm. Bladder: Decompressed by Foley catheter. Other: Heterogeneous mass in the right pelvis measuring 6.4 x 4.4 x 6.4 cm, corresponding to right pelvic hematoma. IMPRESSION: 1. Small simple cyst left kidney.  Kidneys are otherwise normal. 2. Urinary bladder is decompressed by Foley catheter 3. 6.4 cm heterogeneous right pelvic mass presumably corresponds to the large hematoma noted on prior CT Electronically Signed   By: Donavan Foil M.D.   On: 10/22/2021 18:29       Medical Problem List and Plan: 1.  Functional deficits secondary to fall sustaining right superior inferior pubic ramus fracture/pelvic hematoma.  Status postembolization of right pectineal branch artery.  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  Weightbearing as tolerated.  -patient may *** shower  -ELOS/Goals: *** 2.  Antithrombotics: -DVT/anticoagulation:  Mechanical: Antiembolism stockings, thigh (TED hose) Bilateral lower extremities  -antiplatelet therapy: Resume Plavix 75 mg daily 10/30/2021 3. Pain Management: Hydrocodone as needed 4. Mood: Aricept 5 mg daily, Wellbutrin 150 mg daily, 300 mg daily, Namenda 10 mg twice daily, Ativan every 8 hours as needed  -antipsychotic agents: N/A 5. Neuropsych: This patient is capable of making decisions on his own behalf. 6. Skin/Wound Care: Routine skin checks 7. Fluids/Electrolytes/Nutrition: Routine in and outs  with follow-up chemistries 8.  Acute blood loss anemia.  Follow-up CBC 9.  Hyperlipidemia.  Lipitor 10.  Chronic combined systolic diastolic congestive heart failure.  Monitor for any signs of fluid overload 11.  History of left bundle branch block.  Status post pacemaker 2013.  Follow-up outpatient 12.  Type 2 diabetes mellitus.  Hemoglobin A1c 6.7.  SSI.  Patient on Synjardy XR 2 tabs daily prior to admission.  Resume as needed 13.  Urinary retention/BPH.  Urinary tension possibly from pelvic hematoma.  Foley catheter tube removed.  Voiding trial.  Lavon Paganini Maryellen Dowdle, PA-C 10/23/2021

## 2021-10-23 NOTE — Discharge Summary (Addendum)
Physician Discharge Summary  Kevin [name] WVP:[id number] DOB: 1946-10-10 DOA: 10/20/2021  PCP: [name], MD  Admit date: 10/20/2021 Discharge date: 10/24/2021  Admitted From: Home.  Disposition:  CIR  Recommendations for Outpatient Follow-up:  Follow up with PCP in 1-2 weeks Please obtain BMP/CBC in one week Please follow up with IR as recommended.   Discharge Condition: Guarded.  CODE STATUS:DNR Diet recommendation: Heart Healthy / Carb Modified   Brief/Interim Summary: Kevin [name] is a 75 y.o. 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  Discharge Diagnoses:  Principal Problem:   Fall Active Problems:   Hypertension   DM2 (diabetes mellitus, type 2) (HCC)   Hyperlipidemia   Chronic combined systolic and diastolic heart failure (HCC)   Pelvic hematoma in male   Pubic ramus fracture (HCC)   Acute blood loss anemia  Mechanical fall with pelvic hematoma, right superior and inferior ramus fracture:  - s/p Pelvic angiogram, coil embolization of right pectineal branch artery. Gelfoam embolization of the replaced right obturator/corona mortis artery.  - pain control and therapy eval recommending CIR.  Recommend holding plavix for one week and to be resumed on 10/30/21.      Chronic combined systolic and diastolic heart failure:  He appears to be stable/ Euvolemic.       Acute blood loss anemia post op/ Hematoma: -drop in hemoglobin 14 to 10.6.  - hemoglobin remains stable around 10.  - transfuse to keep hemoglobin greater than 8.        Hypertension:  BP parameters are optimal.      Type 2 DM, with hyperglycemia: Resume home meds.   Hemoglobin A1c is 6.7%       Vascular dementia:  Resume  aricept and namenda.        Hyperlipidemia:  Resume statin.    Stage 3 a CKD:  Creatinine stable between 1.2 to 1.3   Urinary retention: possibly from pelvic hematoma.  S/p foley catheter placement. , foley discontinued today, voiding trial today.  Urine cultures Negative. Hematuria resolved.  US Renal unremarkable.       Discharge Instructions  Discharge Instructions     Diet - low sodium heart healthy   Complete by: As directed    Discharge instructions   Complete by: As directed    Please follow up with PCP in one week.   No wound care   Complete by: As directed       Allergies as of 10/23/2021       Reactions   Sulfa Antibiotics Swelling   Swelling of lips only.   5-alpha Reductase Inhibitors         Medication List     STOP taking these medications    Viagra 100 MG tablet Generic drug: sildenafil       TAKE these medications    Accu-Chek Compact Plus test strip Generic drug: glucose blood use 1 TEST STRIP once daily as directed   acetaminophen 500 MG tablet Commonly known as: TYLENOL Take 500 mg by mouth every 6 (six) hours as needed for moderate pain or headache.   atorvastatin 80 MG tablet Commonly known as: LIPITOR Take 40 mg by mouth every evening.   buPROPion 150 MG 24 hr tablet Commonly known as: WELLBUTRIN XL Take 2 tablets in AM, 1  tablet in PM What changed:  how much to take how to take this when to take this additional instructions   cholecalciferol 25 MCG (1000 UNIT) tablet Commonly known as: VITAMIN D Take 1,000 Units by mouth daily.   clobetasol ointment 0.05 % Commonly known as: TEMOVATE Apply 1 application topically daily as needed (itching).   clopidogrel 75 MG tablet Commonly known as: PLAVIX Take 1 tablet (75 mg total) by mouth daily. Start taking on: October 30, 2021 What changed:  how much to take how to take this when to take this additional instructions These instructions start on October 30, 2021. If you are unsure what to do until then, ask your doctor or other care provider.   donepezil 5 MG tablet Commonly known as: ARICEPT Take 1 tablet (5 mg total) by mouth daily.   EYE DROPS OP Place 1 drop into both eyes 3 (three) times daily. Given by [facility] doctor to help astigmtism   FreeStyle Libre 14 Day Reader Kerrin Mo 1 each by Does not apply route See admin instructions. Use Freestyle Libre Reader to monitor blood sugars.   FreeStyle Libre 14 Day Sensor Misc CHANGE EVERY 14 DAYS AS DIRECTED OR NEEDED   LORazepam 0.5 MG tablet Commonly known as: ATIVAN Take 0.5 mg by mouth every 8 (eight) hours as needed for anxiety.   memantine 10 MG tablet Commonly known as: NAMENDA Take 1 tablet twice a day What changed:  how much to take how to take this when to take this additional instructions   promethazine 12.5 MG tablet Commonly known as: PHENERGAN Take 1 tablet (12.5 mg total) by mouth every 6 (six) hours as needed for nausea or vomiting.   Synjardy XR 03-999 MG Tb24 Generic drug: Empagliflozin-metFORMIN HCl ER Take 2 tablets by mouth daily. What changed:  how much to take when to take this        Allergies  Allergen Reactions   Sulfa Antibiotics Swelling    Swelling of lips only.   5-Alpha Reductase Inhibitors     Consultations: IR    Procedures/Studies: CT Head Wo Contrast  Result Date: 10/20/2021 CLINICAL DATA:  Head neck trauma.  Found on ground.  Hypothermia. EXAM: CT HEAD WITHOUT CONTRAST CT CERVICAL SPINE WITHOUT CONTRAST TECHNIQUE: Multidetector CT imaging of the head and cervical spine was performed following the standard protocol without intravenous contrast. Multiplanar CT image reconstructions of the cervical spine were also generated. COMPARISON:  02/11/2020 head CT FINDINGS: CT HEAD FINDINGS Brain: No evidence of acute infarction, hemorrhage, hydrocephalus, extra-axial collection or mass lesion/mass effect. Remote left occipital and left cerebellar  infarcts. Generalized brain atrophy. Vascular: Negative Skull: Normal. Negative for fracture or focal lesion. Sinuses/Orbits: No acute finding. CT CERVICAL SPINE FINDINGS Alignment: No traumatic malalignment Skull base and vertebrae: No acute fracture or aggressive bone lesion. Soft tissues and spinal canal: No prevertebral fluid or swelling. No visible canal hematoma. 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 Prominent adjacent disc and facet degeneration. Upper chest: Biapical pleural based scarring with calcification IMPRESSION: No evidence of acute intracranial or cervical spine injury. Electronically Signed   By: Jorje Guild M.D.   On: 10/20/2021 05:29   CT Cervical Spine Wo Contrast  Result Date: 10/20/2021 CLINICAL DATA:  Head neck trauma.  Found on ground.  Hypothermia. EXAM: CT HEAD WITHOUT CONTRAST CT CERVICAL SPINE WITHOUT CONTRAST TECHNIQUE: Multidetector CT imaging of the head and cervical spine was performed following the standard protocol without intravenous contrast. Multiplanar CT image reconstructions of the cervical spine  were also generated. COMPARISON:  02/11/2020 head CT FINDINGS: CT HEAD FINDINGS Brain: No evidence of acute infarction, hemorrhage, hydrocephalus, extra-axial collection or mass lesion/mass effect. Remote left occipital and left cerebellar infarcts. Generalized brain atrophy. Vascular: Negative Skull: Normal. Negative for fracture or focal lesion. Sinuses/Orbits: No acute finding. CT CERVICAL SPINE FINDINGS Alignment: No traumatic malalignment Skull base and vertebrae: No acute fracture or aggressive bone lesion. Soft tissues and spinal canal: No prevertebral fluid or swelling. No visible canal hematoma. 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 Prominent adjacent disc and facet degeneration. Upper chest: Biapical pleural based scarring with calcification IMPRESSION: No evidence of acute intracranial or cervical spine injury. Electronically Signed   By: Jorje Guild M.D.   On: 10/20/2021 05:29    IR Angiogram Visceral Selective  Result Date: 10/20/2021 INDICATION: 75 year old male status post fall with multiple pelvic fractures and pelvic hematoma with active extravasation. EXAM: 1. Ultrasound-guided vascular access of the left common femoral artery. 2. Catheterization angiography of the right common iliac artery, right external iliac artery, anomalous right obturator artery, and obturator pectineal branch artery. 3. Coil embolization of obturator pectineal branch artery. 4. 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 MEDICATIONS: None. ANESTHESIA/SEDATION: Moderate (conscious) sedation was employed during this procedure. A total of Versed 2 mg and Fentanyl 100 mcg was administered intravenously. Moderate Sedation Time: 39 minutes. The patient's level of consciousness and vital signs were monitored continuously by radiology nursing throughout the procedure under my direct supervision. FLUOROSCOPY TIME:  Fluoroscopy Time: 8.3 minutes (161 mGy). COMPLICATIONS: None immediate. PROCEDURE: Informed consent was obtained from the patient following explanation of the procedure, risks, benefits and alternatives. The patient understands, agrees and consents for the procedure. All questions were addressed. A time out was performed prior to the initiation of the procedure. Maximal barrier sterile technique utilized including caps, mask, sterile gowns, sterile gloves, large sterile drape, hand hygiene, and Betadine prep. The left groin was prepped and draped in standard fashion. Preprocedure ultrasound evaluation demonstrated patency of the left common femoral artery. The procedure was planned. Subdermal Local anesthesia was provided with 1% lidocaine. A small skin nick was made. Under direct ultrasound visualization, the left common femoral artery was accessed with a 20 gauge micropuncture needle. A permanent image was captured and stored in the record. A micropuncture set was introduced and a  limited left lower extremity angiogram was performed which demonstrated adequate puncture site for closure device use. A J wire was inserted over which the micropuncture set was exchanged for a 5 [place], 10 cm vascular sheath. An Omni Flush catheter was inserted over the wire to the level of the distal abdominal aorta. The wire was removed and exchanged for a Glidewire which was then positioned in the right external iliac artery. The Omni Flush catheter was advanced to the level of the common iliac artery. Angiogram was performed from the right common iliac artery which demonstrated wide patency of the inflow vessels. 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 The Omni Flush catheter was exchanged over a J wire for a 5 French angled tip Kumpe the catheter. The Kumpe catheter was directed to the distal right external iliac artery. Through the Kumpe the catheter, a Lantern microcatheter and Fathom 16 microwire were inserted in used to select the corona mortis. Angiogram was performed from the proximal corona  mortis which demonstrated active extravasation arising from a superior pectineal branch. There is also a larger focus of blushing without evidence of active extravasation just to the right word aspect of the pubic symphysis, presumed prostatic branch. The superior pectineal branch was then catheterized. Repeat angiogram demonstrated persistent active extravasation/pseudoaneurysm. Coil embolization was then performed with 2, 1 x 4 mm low-profile Penumbra coils followed by a single 2 x 4 mm coil. The catheter was retracted slightly and repeat angiogram was performed which demonstrated complete embolization of the superior pectineal branch. The catheter was retracted into the proximal right corona mortis. A Gel-Foam  slurry was then administered in until near complete hemostasis. The catheter was flushed and completion angiogram was performed from the proximal right corona mortis without evidence of persistent arterial blushing or active extravasation. The catheters were removed. A 6 French Angio-Seal device was used to close the left common femoral artery arteriotomy site which was deployed successfully. The distal pulses were unchanged. The patient tolerated the procedure well was transferred to the floor in stable condition. IMPRESSION: 1. 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 2. Technically successful coil embolization of the superior pectineal branch artery. 3. Technically successful Gel-Foam embolization of the replaced right obturator artery. Ruthann Cancer, MD [facility] Radiology Electronically Signed   By: Ruthann Cancer M.D.   On: 10/20/2021 16:50   IR Angiogram Pelvis Selective Or Supraselective  Result Date: 10/20/2021 INDICATION: 75 year old male status post fall with multiple pelvic fractures and pelvic hematoma with active extravasation. EXAM: 1. Ultrasound-guided vascular access of the left common femoral artery. 2. Catheterization angiography of the right common iliac artery, right external iliac artery, anomalous right obturator artery, and obturator pectineal branch artery. 3. Coil embolization of obturator pectineal branch artery. 4. 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 MEDICATIONS: None. ANESTHESIA/SEDATION: Moderate (conscious) sedation was employed during this procedure. A total of Versed 2 mg and Fentanyl 100 mcg was administered intravenously. Moderate Sedation Time: 39 minutes. The patient's level of consciousness and vital signs were monitored continuously by radiology nursing  throughout the procedure under my direct supervision. FLUOROSCOPY TIME:  Fluoroscopy Time: 8.3 minutes (161 mGy). COMPLICATIONS: None immediate. PROCEDURE: Informed consent was obtained from the patient following explanation of the procedure, risks, benefits and alternatives. The patient understands, agrees and consents for the procedure. All questions were addressed. A time out was performed prior to the initiation of the procedure. Maximal barrier sterile technique utilized including caps, mask, sterile gowns, sterile gloves, large sterile drape, hand hygiene, and Betadine prep. The left groin was prepped and draped in standard fashion. Preprocedure ultrasound evaluation demonstrated patency of the left common femoral artery. The procedure was planned. Subdermal Local anesthesia was provided with 1% lidocaine. A small skin nick was made. Under direct ultrasound visualization, the left common femoral artery was accessed with a 20 gauge micropuncture needle. A permanent image was captured and stored in the record. A micropuncture set was introduced and a limited left lower extremity angiogram was performed which demonstrated adequate puncture site for closure device use. A J wire was inserted over which the micropuncture set was exchanged for a 5 [place], 10 cm vascular sheath. An Omni Flush catheter was inserted over the wire to the level of the distal abdominal aorta. The wire was removed and exchanged for a Glidewire which was then positioned in the right  external iliac artery. The Omni Flush catheter was advanced to the level of the common iliac artery. Angiogram was performed from the right common iliac artery which demonstrated wide patency of the inflow vessels. 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 The Omni  Flush catheter was exchanged over a J wire for a 5 French angled tip Kumpe the catheter. The Kumpe catheter was directed to the distal right external iliac artery. Through the Kumpe the catheter, a Lantern microcatheter and Fathom 16 microwire were inserted in used to select the corona mortis. Angiogram was performed from the proximal corona mortis which demonstrated active extravasation arising from a superior pectineal branch. There is also a larger focus of blushing without evidence of active extravasation just to the right word aspect of the pubic symphysis, presumed prostatic branch. The superior pectineal branch was then catheterized. Repeat angiogram demonstrated persistent active extravasation/pseudoaneurysm. Coil embolization was then performed with 2, 1 x 4 mm low-profile Penumbra coils followed by a single 2 x 4 mm coil. The catheter was retracted slightly and repeat angiogram was performed which demonstrated complete embolization of the superior pectineal branch. The catheter was retracted into the proximal right corona mortis. A Gel-Foam slurry was then administered in until near complete hemostasis. The catheter was flushed and completion angiogram was performed from the proximal right corona mortis without evidence of persistent arterial blushing or active extravasation. The catheters were removed. A 6 French Angio-Seal device was used to close the left common femoral artery arteriotomy site which was deployed successfully. The distal pulses were unchanged. The patient tolerated the procedure well was transferred to the floor in stable condition. IMPRESSION: 1. 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 2. Technically successful coil embolization of the superior pectineal branch artery. 3. Technically successful Gel-Foam embolization of the replaced right obturator  artery. Ruthann Cancer, MD [facility] Radiology Electronically Signed   By: Ruthann Cancer M.D.   On: 10/20/2021 16:50   US RENAL  Result Date: 10/22/2021 CLINICAL DATA:  Hematuria EXAM: RENAL / URINARY TRACT ULTRASOUND COMPLETE COMPARISON:  CT 10/20/2021 FINDINGS: Right Kidney: Renal measurements: 10.5 x 5.3 x 4.0 cm = volume: 115.2 mL. Echogenicity within normal limits. No mass or hydronephrosis visualized. Left Kidney: Renal measurements: 10.7 x 5.4 x 4 cm = volume: 121.3 mL. Echogenicity within normal limits. No hydronephrosis. Cyst at the upper pole measuring 20 mm. Bladder: Decompressed by Foley catheter. Other: Heterogeneous mass in the right pelvis measuring 6.4 x 4.4 x 6.4 cm, corresponding to right pelvic hematoma. IMPRESSION: 1. Small simple cyst left kidney.  Kidneys are otherwise normal. 2. Urinary bladder is decompressed by Foley catheter 3. 6.4 cm heterogeneous right pelvic mass presumably corresponds to the large hematoma noted on prior CT Electronically Signed   By: Donavan Foil M.D.   On: 10/22/2021 18:29   CT Hip Right Wo Contrast  Result Date: 10/20/2021 CLINICAL DATA:  Hip trauma, fracture suspected, xray done EXAM: CT OF THE RIGHT HIP WITHOUT CONTRAST TECHNIQUE: Multidetector CT imaging of the right hip was performed according to the standard protocol. Multiplanar CT image reconstructions were also generated. COMPARISON:  Same day radiograph FINDINGS: Bones/Joint/Cartilage There are mildly displaced right superior  and inferior pubic ramus fractures. There is no evidence of femoral neck fracture. The acetabulum is intact. 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 No focal bone lesion identified. Ligaments Suboptimally assessed by CT. Muscles and Tendons No muscle atrophy. Soft tissues Within the right hemipelvis, there is a high-density 10.9 x 5.8 x 8.3 cm collection and adjacent fluid/stranding. There is a mass effect on the bladder.  IMPRESSION: Mildly displaced right superior and inferior pubis ramus fractures. Large hematoma within the right hemipelvis with adjacent fluid/stranding, measuring 10.9 x 5.8 x 8.3 cm. Electronically Signed   By: Maurine Simmering M.D.   On: 10/20/2021 10:11   IR US Guide Vasc Access Left  Result Date: 10/20/2021 INDICATION: 75 year old male status post fall with multiple pelvic fractures and pelvic hematoma with active extravasation. EXAM: 1. Ultrasound-guided vascular access of the left common femoral artery. 2. Catheterization angiography of the right common iliac artery, right external iliac artery, anomalous right obturator artery, and obturator pectineal branch artery. 3. Coil embolization of obturator pectineal branch artery. 4. 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 MEDICATIONS: None. ANESTHESIA/SEDATION: Moderate (conscious) sedation was employed during this procedure. A total of Versed 2 mg and Fentanyl 100 mcg was administered intravenously. Moderate Sedation Time: 39 minutes. The patient's level of consciousness and vital signs were monitored continuously by radiology nursing throughout the procedure under my direct supervision. FLUOROSCOPY TIME:  Fluoroscopy Time: 8.3 minutes (161 mGy). COMPLICATIONS: None immediate. PROCEDURE: Informed consent was obtained from the patient following explanation of the procedure, risks, benefits and alternatives. The patient understands, agrees and consents for the procedure. All questions were addressed. A time out was performed prior to the initiation of the procedure. Maximal barrier sterile technique utilized including caps, mask, sterile gowns, sterile gloves, large sterile drape, hand hygiene, and Betadine prep. The left groin was prepped and draped in standard fashion. Preprocedure ultrasound evaluation demonstrated patency of the left common femoral artery. The procedure was planned. Subdermal Local anesthesia was provided with 1%  lidocaine. A small skin nick was made. Under direct ultrasound visualization, the left common femoral artery was accessed with a 20 gauge micropuncture needle. A permanent image was captured and stored in the record. A micropuncture set was introduced and a limited left lower extremity angiogram was performed which demonstrated adequate puncture site for closure device use. A J wire was inserted over which the micropuncture set was exchanged for a 5 [place], 10 cm vascular sheath. An Omni Flush catheter was inserted over the wire to the level of the distal abdominal aorta. The wire was removed and exchanged for a Glidewire which was then positioned in the right external iliac artery. The Omni Flush catheter was advanced to the level of the common iliac artery. Angiogram was performed from the right common iliac artery which demonstrated wide patency of the inflow vessels. 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 The Omni Flush catheter was exchanged over a J wire for a 5 French angled tip Kumpe the catheter. The Kumpe catheter was directed to the distal right external iliac artery. Through the Kumpe the catheter, a Lantern microcatheter and Fathom 16 microwire were inserted in used to select the corona mortis. Angiogram was performed from the proximal corona mortis which demonstrated active extravasation arising from a superior pectineal branch. There is also a larger  focus of blushing without evidence of active extravasation just to the right word aspect of the pubic symphysis, presumed prostatic branch. The superior pectineal branch was then catheterized. Repeat angiogram demonstrated persistent active extravasation/pseudoaneurysm. Coil embolization was then performed with 2, 1 x 4 mm low-profile Penumbra coils  followed by a single 2 x 4 mm coil. The catheter was retracted slightly and repeat angiogram was performed which demonstrated complete embolization of the superior pectineal branch. The catheter was retracted into the proximal right corona mortis. A Gel-Foam slurry was then administered in until near complete hemostasis. The catheter was flushed and completion angiogram was performed from the proximal right corona mortis without evidence of persistent arterial blushing or active extravasation. The catheters were removed. A 6 French Angio-Seal device was used to close the left common femoral artery arteriotomy site which was deployed successfully. The distal pulses were unchanged. The patient tolerated the procedure well was transferred to the floor in stable condition. IMPRESSION: 1. 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 2. Technically successful coil embolization of the superior pectineal branch artery. 3. Technically successful Gel-Foam embolization of the replaced right obturator artery. Ruthann Cancer, MD [facility] Radiology Electronically Signed   By: Ruthann Cancer M.D.   On: 10/20/2021 16:50   DG Chest Port 1 View  Result Date: 10/20/2021 CLINICAL DATA:  Sepsis evaluation. EXAM: PORTABLE CHEST 1 VIEW COMPARISON:  Portable chest 09/13/2016 FINDINGS: Cardiac size is normal. Left chest dual lead pacing system with AID wiring is again noted and unchanged. The lungs are mildly emphysematous but clear of infiltrates. There are overlying wires and artifacts. No pleural effusion is seen. Mild osteopenia. IMPRESSION: No active disease.  Stable COPD chest. Electronically Signed   By: Telford Nab M.D.   On: 10/20/2021 06:06   DG Hip Port Unilat W or Wo Pelvis 1 View Right  Result Date: 10/20/2021 CLINICAL DATA:   75 year old male with history of trauma from a fall. Right-sided hip pain. EXAM: DG HIP (WITH OR WITHOUT PELVIS) 1V PORT RIGHT COMPARISON:  No priors. FINDINGS: Three views of the bony pelvis in the right hip demonstrate no definite acute displaced fracture, subluxation or dislocation. There is joint space narrowing, subchondral sclerosis, subchondral cyst formation and osteophyte formation in the hip joints bilaterally, indicative of moderate to severe osteoarthritis. IMPRESSION: 1. No acute radiographic abnormality of the bony pelvis or the right hip. 2. 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 Electronically Signed   By: Vinnie Langton M.D.   On: 10/20/2021 06:06   IR EMBO ART  VEN HEMORR LYMPH EXTRAV  INC GUIDE ROADMAPPING  Result Date: 10/20/2021 INDICATION: 75 year old male status post fall with multiple pelvic fractures and pelvic hematoma with active extravasation. EXAM: 1. Ultrasound-guided vascular access of the left common femoral artery. 2. Catheterization angiography of the right common iliac artery, right external iliac artery, anomalous right obturator artery, and obturator pectineal branch artery. 3. Coil embolization of obturator pectineal branch artery. 4. 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 MEDICATIONS: None. ANESTHESIA/SEDATION: Moderate (conscious) sedation was employed during this procedure. A total of Versed 2 mg and Fentanyl 100 mcg was administered intravenously. Moderate Sedation Time: 39 minutes. The patient's level of consciousness and vital signs were monitored continuously by radiology nursing throughout the procedure under my direct supervision. FLUOROSCOPY TIME:  Fluoroscopy Time: 8.3 minutes (161 mGy). COMPLICATIONS: None  immediate. PROCEDURE: Informed consent was obtained from the patient following explanation of the procedure, risks, benefits and alternatives. The patient understands, agrees and consents for the procedure. All  questions were addressed. A time out was performed prior to the initiation of the procedure. Maximal barrier sterile technique utilized including caps, mask, sterile gowns, sterile gloves, large sterile drape, hand hygiene, and Betadine prep. The left groin was prepped and draped in standard fashion. Preprocedure ultrasound evaluation demonstrated patency of the left common femoral artery. The procedure was planned. Subdermal Local anesthesia was provided with 1% lidocaine. A small skin nick was made. Under direct ultrasound visualization, the left common femoral artery was accessed with a 20 gauge micropuncture needle. A permanent image was captured and stored in the record. A micropuncture set was introduced and a limited left lower extremity angiogram was performed which demonstrated adequate puncture site for closure device use. A J wire was inserted over which the micropuncture set was exchanged for a 5 [place], 10 cm vascular sheath. An Omni Flush catheter was inserted over the wire to the level of the distal abdominal aorta. The wire was removed and exchanged for a Glidewire which was then positioned in the right external iliac artery. The Omni Flush catheter was advanced to the level of the common iliac artery. Angiogram was performed from the right common iliac artery which demonstrated wide patency of the inflow vessels. 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 The Omni Flush catheter was exchanged over a J wire for a 5 French angled tip Kumpe the catheter. The Kumpe catheter was directed to the distal right external iliac artery. Through the Kumpe the catheter, a Lantern microcatheter and Fathom 16 microwire were inserted in used to select the corona mortis. Angiogram was performed from the proximal corona  mortis which demonstrated active extravasation arising from a superior pectineal branch. There is also a larger focus of blushing without evidence of active extravasation just to the right word aspect of the pubic symphysis, presumed prostatic branch. The superior pectineal branch was then catheterized. Repeat angiogram demonstrated persistent active extravasation/pseudoaneurysm. Coil embolization was then performed with 2, 1 x 4 mm low-profile Penumbra coils followed by a single 2 x 4 mm coil. The catheter was retracted slightly and repeat angiogram was performed which demonstrated complete embolization of the superior pectineal branch. The catheter was retracted into the proximal right corona mortis. A Gel-Foam slurry was then administered in until near complete hemostasis. The catheter was flushed and completion angiogram was performed from the proximal right corona mortis without evidence of persistent arterial blushing or active extravasation. The catheters were removed. A 6 French Angio-Seal device was used to close the left common femoral artery arteriotomy site which was deployed successfully. The distal pulses were unchanged. The patient tolerated the procedure well was transferred to the floor in stable condition. IMPRESSION: 1. 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 2. Technically successful coil embolization of the superior pectineal branch artery. 3. Technically successful Gel-Foam embolization of the replaced right obturator artery. [place]ann Cancer, MD [facility] Radiology Electronically Signed   By: Ruthann Cancer M.D.   On: 10/20/2021 16:50   CT Angio Abd/Pel w/ and/or w/o  Result Date: 10/20/2021 CLINICAL DATA:  Right pubic rami fractures. Hematoma in the right hemipelvis. Evaluate for ongoing bleeding. EXAM:  CT ANGIOGRAPHY ABDOMEN AND PELVIS WITH CONTRAST TECHNIQUE: Multidetector CT imaging of the abdomen and pelvis was performed using the standard protocol during bolus administration of intravenous contrast. Multiplanar reconstructed images and MIPs were obtained and reviewed to evaluate the vascular anatomy. CONTRAST:  148mL OMNIPAQUE IOHEXOL 350 MG/ML SOLN COMPARISON:  CT right hip 10/20/2021 FINDINGS: VASCULAR Aorta: Normal caliber aorta without aneurysm, dissection, vasculitis or significant stenosis. Celiac: Patent without evidence of aneurysm, dissection, vasculitis or significant stenosis. SMA: Patent without evidence of aneurysm, dissection, vasculitis or significant stenosis. Renals: Both renal arteries are patent without evidence of aneurysm, dissection, vasculitis, fibromuscular dysplasia or significant stenosis. IMA: Patent without evidence of aneurysm, dissection, vasculitis or significant stenosis. Inflow: Patent without evidence of aneurysm, dissection, vasculitis or significant stenosis. Proximal Outflow: Proximal femoral arteries are patent bilaterally. There is a coronal mortis vascular variant originating from the distal right external iliac artery. Coronal mortis branches appear to be supplying areas of extravasation along the right side of the prostate on sequence 5 image 220 and near the right superior pubic ramus on sequence 5, image 208. These areas of contrast extravasation enlarge on the venous images. Veins: Portal venous system is patent. Hepatic veins are patent. Renal veins are patent. Limited evaluation of the iliac veins due to the timing of the study. Review of the MIP images confirms the above findings. NON-VASCULAR Lower chest: Small nodules in the left lower lobe and some could be calcified. Largest nodule measures approximately 3 mm. Dependent changes in the lower lungs. No pleural effusions. Patient has a of ICD. Hepatobiliary: Normal appearance of the liver. No biliary dilatation.  Small calcified gallstone at the gallbladder base without distention or inflammatory changes. Pancreas: Unremarkable. No pancreatic ductal dilatation or surrounding inflammatory changes. Spleen: Normal in size without focal abnormality. Adrenals/Urinary Tract: Normal adrenal glands. 1.8 cm exophytic left renal cyst. No suspicious renal lesions. Probable small cyst in the right kidney upper pole. Negative for hydronephrosis. Urinary bladder is displaced towards the left from the right hemipelvic hematoma. Stomach/Bowel: Stomach is within normal limits. Appendix appears normal. No evidence of bowel wall thickening, distention, or inflammatory changes. Lymphatic: No lymph node enlargement in the abdomen or pelvis. Reproductive: Calcifications in the prostate. Small focus contrast extravasation along the right anterior aspect of the prostate. Seminal vesicles are slightly displaced towards the left. Other: There is a large right-sided pelvic hematoma that measures 8.2 x 6.0 x 5.9 cm. Small focus of bleeding within this large pelvic hematoma. Evidence for contrast accumulating along the right anterior aspect of the prostate compatible with contrast extravasation and bleeding. There is additional blood in the posterior right hemipelvis and there is blood tracking up along the anterior lower abdomen and into the right paracolic gutter region. Musculoskeletal: Minimally displaced right superior pubic ramus fracture. Displaced right inferior pubic ramus fracture. Fracture of the right inferior sacrum, best seen on sequence 3, image 848. Left pubic rami are intact. Both hips are located. Minimal retrolisthesis of L4 on L5. Disc space narrowing at L3-L4 and L4-L5. IMPRESSION: VASCULAR 1. Two foci of active bleeding in the right hemipelvis. Largest focus of bleeding is along the right anterior aspect of the prostate. Smaller focus of bleeding is near the right superior pubic ramus. These areas of bleeding appear to be supplied  by a corona mortis branch that originates from the distal right external iliac artery. NON-VASCULAR  1. Fractures of the right superior and right inferior pubic ramus. Right sacral fracture. Large right hemipelvic hematoma with blood extending into the lower abdomen. Bladder and seminal vesicles are displaced from the hematoma. 2. Small nonspecific nodules in left lung base, some of these could be calcified. Largest nodule measures 3 mm. No follow-up needed if patient is low-risk (and has no known or suspected primary neoplasm). Non-contrast chest CT can be considered in 12 months if patient is high-risk. This recommendation follows the consensus statement: Guidelines for Management of Incidental Pulmonary Nodules Detected on CT Images: From the Fleischner Society 2017; Radiology 2017; 284:228-243. 3. Cholelithiasis. No evidence for gallbladder distention or inflammation. These results were called by telephone at the time of interpretation on 10/20/2021 at 12:20 pm to provider Wynona Dove , who verbally acknowledged these results. Electronically Signed   By: Markus Daft M.D.   On: 10/20/2021 13:19      Subjective:  No new complaints. Hematuria resolved.  Discharge Exam: Vitals:   10/23/21 0445 10/23/21 0805  BP: 140/73 (!) 150/72  Pulse: 84 85  Resp: 18 20  Temp: 98.1 F (36.7 C) (!) 97.5 F (36.4 C)  SpO2: 96% 96%   Vitals:   10/22/21 2303 10/23/21 0445 10/23/21 0544 10/23/21 0805  BP: 131/67 140/73  (!) 150/72  Pulse: 89 84  85  Resp: 16 18  20   Temp: 98.4 F (36.9 C) 98.1 F (36.7 C)  (!) 97.5 F (36.4 C)  TempSrc: Oral Oral  Oral  SpO2: 96% 96%  96%  Weight:   72.6 kg   Height:        General: Pt is alert, awake, not in acute distress Cardiovascular: RRR, S1/S2 +, no rubs, no gallops Respiratory: CTA bilaterally, no wheezing, no rhonchi Abdominal: Soft, NT, ND, bowel sounds + Extremities: no edema, no cyanosis    The results of significant diagnostics from this  hospitalization (including imaging, microbiology, ancillary and laboratory) are listed below for reference.     Microbiology: Recent Results (from the past 240 hour(s))  Blood culture (routine x 2)     Status: None (Preliminary result)   Collection Time: 10/20/21  5:44 AM   Specimen: BLOOD  Result Value Ref Range Status   Specimen Description BLOOD RIGHT ANTECUBITAL  Final   Special Requests   Final    BOTTLES DRAWN AEROBIC AND ANAEROBIC Blood Culture adequate volume   Culture   Final    NO GROWTH 3 DAYS Performed at [facility], [address] [place], [place] [address]    Report Status PENDING  Incomplete  Blood culture (routine x 2)     Status: None (Preliminary result)   Collection Time: 10/20/21  5:49 AM   Specimen: BLOOD LEFT HAND  Result Value Ref Range Status   Specimen Description BLOOD LEFT HAND  Final   Special Requests   Final    BOTTLES DRAWN AEROBIC AND ANAEROBIC Blood Culture adequate volume   Culture   Final    NO GROWTH 3 DAYS Performed at [facility], [place] [address], [place], [place] [address]    Report Status PENDING  Incomplete  Urine Culture     Status: None   Collection Time: 10/20/21  6:27 AM   Specimen: In/Out Cath Urine  Result Value Ref Range Status   Specimen Description IN/OUT CATH URINE  Final   Special Requests NONE  Final   Culture   Final    NO GROWTH Performed at  [facility], [place] [address], [place], [place] [address]    Report Status 10/21/2021 FINAL  Final  Blood Culture (routine x 2)     Status: None (Preliminary result)   Collection Time: 10/20/21  7:36 PM   Specimen: BLOOD  Result Value Ref Range Status   Specimen Description BLOOD RIGHT ANTECUBITAL  Final   Special Requests   Final    BOTTLES DRAWN AEROBIC AND ANAEROBIC Blood Culture results may not be optimal due to an excessive volume of blood received in culture bottles   Culture   Final    NO GROWTH 3 DAYS Performed at [facility], [place]  [address], [place], [place] [address]    Report Status PENDING  Incomplete  Blood Culture (routine x 2)     Status: None (Preliminary result)   Collection Time: 10/20/21  7:39 PM   Specimen: BLOOD  Result Value Ref Range Status   Specimen Description BLOOD LEFT ANTECUBITAL  Final   Special Requests   Final    BOTTLES DRAWN AEROBIC AND ANAEROBIC Blood Culture adequate volume   Culture   Final    NO GROWTH 3 DAYS Performed at [facility], [place] [address], [place], [place] [address]    Report Status PENDING  Incomplete  Resp Panel by RT-PCR (Flu A&B, Covid) Nasopharyngeal Swab     Status: None   Collection Time: 10/21/21 12:26 AM   Specimen: Nasopharyngeal Swab; Nasopharyngeal(NP) swabs in vial transport medium  Result Value Ref Range Status   SARS Coronavirus 2 by RT PCR NEGATIVE NEGATIVE Final    Comment: (NOTE) SARS-CoV-2 target nucleic acids are NOT DETECTED.  The SARS-CoV-2 RNA is generally detectable in upper respiratory specimens during the acute phase of infection. The lowest concentration of SARS-CoV-2 viral copies this assay can detect is 138 copies/mL. A negative result does not preclude SARS-Cov-2 infection and should not be used as the sole basis for treatment or other patient management decisions. A negative result may occur with  improper specimen collection/handling, submission of specimen other than nasopharyngeal swab, presence of viral mutation(s) within the areas targeted by this assay, and inadequate number of viral copies(<138 copies/mL). A negative result must be combined with clinical observations, patient history, and epidemiological information. The expected result is Negative.  Fact Sheet for Patients:  EntrepreneurPulse.com.au  Fact Sheet for Healthcare Providers:  IncredibleEmployment.be  This test is no t yet approved or cleared by the [place] FDA and  has been authorized for detection and/or diagnosis of  SARS-CoV-2 by FDA under an Emergency Use Authorization (EUA). This EUA will remain  in effect (meaning this test can be used) for the duration of the COVID-19 declaration under Section 564(b)(1) of the Act, 21 U.S.C.section 360bbb-3(b)(1), unless the authorization is terminated  or revoked sooner.       Influenza A by PCR NEGATIVE NEGATIVE Final   Influenza B by PCR NEGATIVE NEGATIVE Final    Comment: (NOTE) The Xpert Xpress SARS-CoV-2/FLU/RSV plus assay is intended as an aid in the diagnosis of influenza from Nasopharyngeal swab specimens and should not be used as a sole basis for treatment. Nasal washings and aspirates are unacceptable for Xpert Xpress SARS-CoV-2/FLU/RSV testing.  Fact Sheet for Patients: EntrepreneurPulse.com.au  Fact Sheet for Healthcare Providers: IncredibleEmployment.be  This test is not yet approved or cleared by the [place] FDA and has been authorized for detection and/or diagnosis of SARS-CoV-2 by FDA under an Emergency Use Authorization (EUA). This EUA will  remain in effect (meaning this test can be used) for the duration of the COVID-19 declaration under Section 564(b)(1) of the Act, 21 U.S.C. section 360bbb-3(b)(1), unless the authorization is terminated or revoked.  Performed at [facility], [place] [address], [place], [place] [address]      Labs: BNP (last 3 results) No results for input(s): BNP in the last 8760 hours. Basic Metabolic Panel: Recent Labs  Lab 10/20/21 0357 10/20/21 1938 10/21/21 0156 10/22/21 0359  NA 138 138 135 138  K 3.9 4.4 4.6 4.7  CL 107 107 106 105  CO2 21* 25 25 28   GLUCOSE 209* 96 166* 130*  BUN 15 14 14 12   CREATININE 1.32* 1.28* 1.25* 1.39*  CALCIUM 8.1* 8.4* 8.3* 8.5*   Liver Function Tests: Recent Labs  Lab 10/20/21 1938  AST 19  ALT 11  ALKPHOS 66  BILITOT 1.0  PROT 5.2*  ALBUMIN 2.9*   No results for input(s): LIPASE, AMYLASE in the last 168  hours. No results for input(s): AMMONIA in the last 168 hours. CBC: Recent Labs  Lab 10/20/21 0357 10/20/21 1939 10/21/21 0156 10/22/21 0359  WBC 16.8* 7.9 8.6 7.7  NEUTROABS 14.4*  --   --   --   HGB 14.2 11.4* 10.6* 10.4*  HCT 43.2 35.1* 32.8* 32.0*  MCV 102.6* 102.6* 101.5* 101.6*  PLT 135* 104* 103* 99*   Cardiac Enzymes: Recent Labs  Lab 10/20/21 0357  CKTOTAL 134   BNP: Invalid input(s): POCBNP CBG: Recent Labs  Lab 10/21/21 2108 10/22/21 0614 10/22/21 1233 10/22/21 1638 10/23/21 0607  GLUCAP 111* 125* 152* 173* 149*   D-Dimer No results for input(s): DDIMER in the last 72 hours. Hgb A1c Recent Labs    10/22/21 0359  HGBA1C 6.7*   Lipid Profile No results for input(s): CHOL, HDL, LDLCALC, TRIG, CHOLHDL, LDLDIRECT in the last 72 hours. Thyroid function studies No results for input(s): TSH, T4TOTAL, T3FREE, THYROIDAB in the last 72 hours.  Invalid input(s): FREET3 Anemia work up Recent Labs    10/21/21 0156 10/21/21 1354  VITAMINB12  --  306  FOLATE  --  11.0  FERRITIN  --  108  TIBC  --  287  IRON  --  86  RETICCTPCT 1.6  --    Urinalysis    Component Value Date/Time   COLORURINE AMBER (A) 10/22/2021 1248   APPEARANCEUR CLOUDY (A) 10/22/2021 1248   LABSPEC 1.015 10/22/2021 1248   PHURINE 7.5 10/22/2021 1248   GLUCOSEU 250 (A) 10/22/2021 1248   GLUCOSEU NEGATIVE 02/06/2020 1609   HGBUR LARGE (A) 10/22/2021 1248   BILIRUBINUR SMALL (A) 10/22/2021 1248   KETONESUR NEGATIVE 10/22/2021 1248   PROTEINUR 30 (A) 10/22/2021 1248   UROBILINOGEN 0.2 02/06/2020 1609   NITRITE NEGATIVE 10/22/2021 1248   LEUKOCYTESUR TRACE (A) 10/22/2021 1248   Sepsis Labs Invalid input(s): PROCALCITONIN,  WBC,  LACTICIDVEN Microbiology Recent Results (from the past 240 hour(s))  Blood culture (routine x 2)     Status: None (Preliminary result)   Collection Time: 10/20/21  5:44 AM   Specimen: BLOOD  Result Value Ref Range Status   Specimen Description BLOOD  RIGHT ANTECUBITAL  Final   Special Requests   Final    BOTTLES DRAWN AEROBIC AND ANAEROBIC Blood Culture adequate volume   Culture   Final    NO GROWTH 3 DAYS Performed at [facility], [address] [place], [place] [address]    Report Status PENDING  Incomplete  Blood culture (routine x 2)     Status: None (Preliminary result)   Collection Time: 10/20/21  5:49 AM   Specimen: BLOOD LEFT HAND  Result Value Ref Range Status   Specimen Description BLOOD LEFT HAND  Final   Special Requests   Final    BOTTLES DRAWN AEROBIC AND ANAEROBIC Blood Culture adequate volume   Culture   Final    NO GROWTH 3 DAYS Performed at [facility], [address] [place], [place] [address]    Report Status PENDING  Incomplete  Urine Culture     Status: None   Collection Time: 10/20/21  6:27 AM   Specimen: In/Out Cath Urine  Result Value Ref Range Status   Specimen Description IN/OUT CATH URINE  Final   Special Requests NONE  Final   Culture   Final    NO GROWTH Performed at [facility], [place] [address], [place], [place] [address]    Report Status 10/21/2021 FINAL  Final  Blood Culture (routine x 2)     Status: None (Preliminary result)   Collection Time: 10/20/21  7:36 PM   Specimen: BLOOD  Result Value Ref Range Status   Specimen Description BLOOD RIGHT ANTECUBITAL  Final   Special Requests   Final    BOTTLES DRAWN AEROBIC AND ANAEROBIC Blood Culture results may not be optimal due to an excessive volume of blood received in culture bottles   Culture   Final    NO GROWTH 3 DAYS Performed at [facility], [place] [address] [place], [place] [address]    Report Status PENDING  Incomplete  Blood Culture (routine x 2)     Status: None (Preliminary result)   Collection Time: 10/20/21  7:39 PM   Specimen: BLOOD  Result Value Ref Range Status   Specimen Description BLOOD LEFT ANTECUBITAL  Final   Special Requests   Final    BOTTLES DRAWN AEROBIC AND ANAEROBIC Blood Culture  adequate volume   Culture   Final    NO GROWTH 3 DAYS Performed at [facility], [place] [address] [place], [place] [address]    Report Status PENDING  Incomplete  Resp Panel by RT-PCR (Flu A&B, Covid) Nasopharyngeal Swab     Status: None   Collection Time: 10/21/21 12:26 AM   Specimen: Nasopharyngeal Swab; Nasopharyngeal(NP) swabs in vial transport medium  Result Value Ref Range Status   SARS Coronavirus 2 by RT PCR NEGATIVE NEGATIVE Final    Comment: (NOTE) SARS-CoV-2 target nucleic acids are NOT DETECTED.  The SARS-CoV-2 RNA is generally detectable in upper respiratory specimens during the acute phase of infection. The lowest concentration of SARS-CoV-2 viral copies this assay can detect is 138 copies/mL. A negative result does not preclude SARS-Cov-2 infection and should not be used as the sole basis for treatment or other patient management decisions. A negative result may occur with  improper specimen collection/handling, submission of specimen other than nasopharyngeal swab, presence of viral mutation(s) within the areas targeted by this assay, and inadequate number of viral copies(<138 copies/mL). A negative result must be combined with clinical observations, patient history, and epidemiological information. The expected result is Negative.  Fact Sheet for Patients:  EntrepreneurPulse.com.au  Fact Sheet for Healthcare Providers:  IncredibleEmployment.be  This test is no t yet approved or cleared by the [place] FDA and  has been authorized for detection and/or diagnosis of SARS-CoV-2 by FDA under an Emergency Use Authorization (EUA). This EUA will  remain  in effect (meaning this test can be used) for the duration of the COVID-19 declaration under Section 564(b)(1) of the Act, 21 U.S.C.section 360bbb-3(b)(1), unless the authorization is terminated  or revoked sooner.       Influenza A by PCR NEGATIVE NEGATIVE Final    Influenza B by PCR NEGATIVE NEGATIVE Final    Comment: (NOTE) The Xpert Xpress SARS-CoV-2/FLU/RSV plus assay is intended as an aid in the diagnosis of influenza from Nasopharyngeal swab specimens and should not be used as a sole basis for treatment. Nasal washings and aspirates are unacceptable for Xpert Xpress SARS-CoV-2/FLU/RSV testing.  Fact Sheet for Patients: EntrepreneurPulse.com.au  Fact Sheet for Healthcare Providers: IncredibleEmployment.be  This test is not yet approved or cleared by the [place] FDA and has been authorized for detection and/or diagnosis of SARS-CoV-2 by FDA under an Emergency Use Authorization (EUA). This EUA will remain in effect (meaning this test can be used) for the duration of the COVID-19 declaration under Section 564(b)(1) of the Act, 21 U.S.C. section 360bbb-3(b)(1), unless the authorization is terminated or revoked.  Performed at [facility], [place] [address] [place], [place] [address]      Time coordinating discharge: 36 minutes.   SIGNED:   Hosie Poisson, MD  [facility] 10/23/2021, 11:05 AM

## 2021-10-23 NOTE — Progress Notes (Signed)
The patient became increasingly confused and agitated this evening.  He refused to have his blood sugar check, but he did agree to take his nightly oral medicines along with 2 tablets of Norco and 0.5 mg of Ativan. Nurse and nurse tech gently reoriented the patient and called the pt's wife for him to speak with her.  The pt started to calm down and became more cooperative after talking to his wife.  Will continue to monitor pt.  Lupita Dawn, RN

## 2021-10-23 NOTE — TOC Initial Note (Signed)
Transition of Care (TOC) - Initial/Assessment Note  Marvetta Gibbons RN, BSN Transitions of Care Unit 4E- RN Case Manager See Treatment Team for direct phone #    Patient Details  Name: Kevin [name] MRN: [id number] Date of Birth: 01-06-1946  Transition of Care [facility]) CM/SW Contact:    Dawayne Patricia, RN Phone Number: 10/23/2021, 3:21 PM  Clinical Narrative:                 Pt admitted from home s/p fall, plan for [facility] INPT rehab, have auth from [place]. Per admission coordinate- anticipate bed available on 10/24/21- and will plan to admit tomorrow to rehab.   Expected Discharge Plan: IP Rehab Facility Barriers to Discharge: No Barriers Identified   Patient Goals and CMS Choice Patient states their goals for this hospitalization and ongoing recovery are:: rehab   Choice offered to / list presented to : Patient  Expected Discharge Plan and Services Expected Discharge Plan: [place]   Discharge Planning Services: CM Consult Post Acute Care Choice: IP Rehab Living arrangements for the past 2 months: Single Family Home Expected Discharge Date: 10/23/21                                    Prior Living Arrangements/Services Living arrangements for the past 2 months: Single Family Home Lives with:: Spouse Patient language and need for interpreter reviewed:: Yes        Need for Family Participation in Patient Care: Yes (Comment) Care giver support system in place?: Yes (comment)   Criminal Activity/Legal Involvement Pertinent to Current Situation/Hospitalization: No - Comment as needed  Activities of Daily Living Home Assistive Devices/Equipment: Blood pressure cuff, Walker (specify type) ADL Screening (condition at time of admission) Patient's cognitive ability adequate to safely complete daily activities?: Yes Is the patient deaf or have difficulty hearing?: No Does the patient have difficulty seeing, even when wearing glasses/contacts?: No Does the  patient have difficulty concentrating, remembering, or making decisions?: Yes Patient able to express need for assistance with ADLs?: Yes Does the patient have difficulty dressing or bathing?: No Independently performs ADLs?: Yes (appropriate for developmental age) Does the patient have difficulty walking or climbing stairs?: No Weakness of Legs: None Weakness of Arms/Hands: None  Permission Sought/Granted                  Emotional Assessment Appearance:: Appears stated age     Orientation: : Oriented to Self, Oriented to Place, Oriented to  Time, Oriented to Situation Alcohol / Substance Use: Not Applicable Psych Involvement: No (comment)  Admission diagnosis:  Vascular injury [T14.8XXA] Pelvic hematoma in male [N50.1] Fall, initial encounter [W19.XXXA] Closed fracture of ramus of right pubis, initial encounter [facility]) [S32.591A] Fall [W19.XXXA] Patient Active Problem List   Diagnosis Date Noted   Fall 10/21/2021   Pelvic hematoma in male 10/21/2021   Pubic ramus fracture ([place]) 10/21/2021   Acute blood loss anemia 10/21/2021   Acute COVID-19 06/16/2021   Chronic combined systolic and diastolic heart failure ([place]) 09/28/2017   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 in situ 09/28/2017   Hx of adenomatous colonic polyps 10/20/2016   Malignant neoplasm of prostate ([place]) 97/12/6376   Chronic systolic dysfunction of left ventricle 01/22/2015   Left bundle branch block 11/24/2014   Sick sinus syndrome ([place]) 11/24/2014   Nonischemic cardiomyopathy ([place]) 11/23/2013   Ventricular tachycardia 11/23/2013   DM2 (diabetes mellitus, type  2) ([place]) 08/27/2013   Hyperlipidemia 08/27/2013   Condyloma 05/14/2013   Penile abnormality 05/05/2013   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/01/2013   Penile mass 02/19/2013   Hypertension 10/16/2012   Rectal discharge 07/14/2012   Jaw pain 07/14/2012   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

## 2021-10-23 NOTE — Progress Notes (Signed)
PROGRESS NOTE    Kevin [name]  YQI:[id number] DOB: 29-Dec-1945 DOA: 10/20/2021 PCP: [name], MD    Chief Complaint  Patient presents with   [name] Exposure    Brief Narrative:    Kevin [name] is a 75 y.o. 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  Assessment & Plan:   Principal Problem:   Fall Active Problems:   Hypertension   DM2 (diabetes mellitus, type 2) ([place])   Hyperlipidemia   Chronic combined systolic and diastolic heart failure (H[facility])   Pelvic hematoma in male   Pubic ramus fracture ([facility])   Acute blood loss anemia   Mechanical fall with pelvic hematoma, right superior and inferior ramus fracture:  - s/p Pelvic angiogram, coil embolization of right pectineal branch artery. Gelfoam embolization of the replaced right obturator/corona mortis artery.  - pain control and therapy eval recommending CIR.    Chronic combined systolic and diastolic heart failure:  He appears to be stable/ Euvolemic.    Acute blood loss anemia post op/ Hematoma: -drop in hemoglobin 14 to 10.6.  - hemoglobin remains stable around 10.  - transfuse to keep hemoglobin greater than 8.     Hypertension:  BP parameters are well controlled.    Type 2 DM, with hyperglycemia: CBG (last 3)  Recent Labs    10/22/21 1638 10/23/21 0607 10/23/21 1143  GLUCAP 173* 149* 162*    Resume SSI. Hemoglobin A1c is 6.7%    Vascular dementia:  Resume aricept and namenda.     Hyperlipidemia:  Resume statin.   Stage 3 a CKD:  Creatinine stable around 1.2  Urinary retention: possibly from pelvic hematoma.  S/p foley catheter placement.  D/c foley today,  Voiding trial.     DVT prophylaxis: scd's Code Status:  DNR Family Communication: family at bedside Disposition:   Status is: Observation  The patient will require care spanning > 2 midnights and should be moved to inpatient because: CIR       Consultants:  IR  Procedures:   Pelvic angiogram, coil embolization of right pectineal branch artery. Gelfoam embolization of the replaced right obturator/corona mortis artery.  Antimicrobials: none.   Subjective: No new complaints.   Objective: Vitals:   10/23/21 0445 10/23/21 0544 10/23/21 0805 10/23/21 1147  BP: 140/73  (!) 150/72 140/70  Pulse: 84  85 91  Resp: 18  20 18   Temp: 98.1 F (36.7 C)  (!) 97.5 F (36.4 C) 97.9 F (36.6 C)  TempSrc: Oral  Oral Oral  SpO2: 96%  96% 96%  Weight:  72.6 kg    Height:        Intake/Output Summary (Last 24 hours) at 10/23/2021 1618 Last data filed at 10/23/2021 1340 Gross per 24 hour  Intake 840 ml  Output 1200 ml  Net -360 ml    Filed Weights   10/21/21 0424 10/22/21 0339 10/23/21 0544  Weight: 76.9 kg 76.7 kg 72.6 kg    Examination:  General exam: Appears calm and comfortable  Respiratory system: Clear to auscultation. Respiratory effort normal. Cardiovascular system: S1 & S2 heard, RRR. No JVD,  No pedal edema. Gastrointestinal system: Abdomen is nondistended, soft and nontender.. Normal bowel sounds heard. Central nervous system: Alert and oriented. No focal neurological deficits. Extremities: Symmetric 5 x  5 power. Skin: No rashes, lesions or ulcers Psychiatry: Mood & affect appropriate.       Data Reviewed: I have personally reviewed following labs and imaging studies  CBC: Recent Labs  Lab 10/20/21 0357 10/20/21 1939 10/21/21 0156 10/22/21 0359  WBC 16.8* 7.9 8.6 7.7  NEUTROABS 14.4*  --   --   --   HGB 14.2 11.4* 10.6* 10.4*  HCT 43.2 35.1* 32.8* 32.0*  MCV 102.6* 102.6* 101.5* 101.6*  PLT 135* 104* 103* 99*     Basic Metabolic Panel: Recent Labs  Lab 10/20/21 0357 10/20/21 1938 10/21/21 0156  10/22/21 0359  NA 138 138 135 138  K 3.9 4.4 4.6 4.7  CL 107 107 106 105  CO2 21* 25 25 28   GLUCOSE 209* 96 166* 130*  BUN 15 14 14 12   CREATININE 1.32* 1.28* 1.25* 1.39*  CALCIUM 8.1* 8.4* 8.3* 8.5*     GFR: Estimated Creatinine Clearance: 47.2 mL/min (A) (by C-G formula based on SCr of 1.39 mg/dL (H)).  Liver Function Tests: Recent Labs  Lab 10/20/21 1938  AST 19  ALT 11  ALKPHOS 66  BILITOT 1.0  PROT 5.2*  ALBUMIN 2.9*     CBG: Recent Labs  Lab 10/22/21 0614 10/22/21 1233 10/22/21 1638 10/23/21 0607 10/23/21 1143  GLUCAP 125* 152* 173* 149* 162*      Recent Results (from the past 240 hour(s))  Blood culture (routine x 2)     Status: None (Preliminary result)   Collection Time: 10/20/21  5:44 AM   Specimen: BLOOD  Result Value Ref Range Status   Specimen Description BLOOD RIGHT ANTECUBITAL  Final   Special Requests   Final    BOTTLES DRAWN AEROBIC AND ANAEROBIC Blood Culture adequate volume   Culture   Final    NO GROWTH 3 DAYS Performed at [facility], [place] [address], [place], [place] [address]    Report Status PENDING  Incomplete  Blood culture (routine x 2)     Status: None (Preliminary result)   Collection Time: 10/20/21  5:49 AM   Specimen: BLOOD LEFT HAND  Result Value Ref Range Status   Specimen Description BLOOD LEFT HAND  Final   Special Requests   Final    BOTTLES DRAWN AEROBIC AND ANAEROBIC Blood Culture adequate volume   Culture   Final    NO GROWTH 3 DAYS Performed at [facility], [place] [address] [place], [place] [address]    Report Status PENDING  Incomplete  Urine Culture     Status: None   Collection Time: 10/20/21  6:27 AM   Specimen: In/Out Cath Urine  Result Value Ref Range Status   Specimen Description IN/OUT CATH URINE  Final   Special Requests NONE  Final   Culture   Final    NO GROWTH Performed at [facility], [place] [address] [place] [address], [place], [place] [address]    Report Status 10/21/2021 FINAL  Final   Blood Culture (routine x 2)     Status: None (Preliminary result)   Collection Time: 10/20/21  7:36 PM   Specimen: BLOOD  Result Value Ref Range Status   Specimen Description BLOOD RIGHT ANTECUBITAL  Final   Special Requests   Final    BOTTLES DRAWN AEROBIC AND ANAEROBIC Blood Culture results may not be optimal due to an excessive volume of blood received in culture bottles   Culture   Final    NO GROWTH 3 DAYS Performed at [facility], [place] [address], [place], [place] [address]    Report Status PENDING  Incomplete  Blood Culture (routine x 2)     Status: None (Preliminary result)   Collection Time: 10/20/21  7:39 PM   Specimen: BLOOD  Result Value Ref Range Status   Specimen Description BLOOD LEFT ANTECUBITAL  Final   Special Requests   Final    BOTTLES DRAWN AEROBIC AND ANAEROBIC Blood Culture adequate volume   Culture   Final    NO GROWTH 3 DAYS Performed at [facility], [place] [address] [place], [place] [address]    Report Status PENDING  Incomplete  Resp Panel by RT-PCR (Flu A&B, Covid) Nasopharyngeal Swab     Status: None   Collection Time: 10/21/21 12:26 AM   Specimen: Nasopharyngeal Swab; Nasopharyngeal(NP) swabs in vial transport medium  Result Value Ref Range Status   SARS Coronavirus 2 by RT PCR NEGATIVE NEGATIVE Final    Comment: (NOTE) SARS-CoV-2 target nucleic acids are NOT DETECTED.  The SARS-CoV-2 RNA is generally detectable in upper respiratory specimens during the acute phase of infection. The lowest concentration of SARS-CoV-2 viral copies this assay can detect is 138 copies/mL. A negative result does not preclude SARS-Cov-2 infection and should not be used as the sole basis for treatment or other patient management decisions. A negative result may occur with  improper specimen collection/handling, submission of specimen other than nasopharyngeal swab, presence of viral mutation(s) within the areas targeted by this assay, and inadequate number  of viral copies(<138 copies/mL). A negative result must be combined with clinical observations, patient history, and epidemiological information. The expected result is Negative.  Fact Sheet for Patients:  EntrepreneurPulse.com.au  Fact Sheet for Healthcare Providers:  IncredibleEmployment.be  This test is no t yet approved or cleared by the [place] FDA and  has been authorized for detection and/or diagnosis of SARS-CoV-2 by FDA under an Emergency Use Authorization (EUA). This EUA will remain  in effect (meaning this test can be used) for the duration of the COVID-19 declaration under Section 564(b)(1) of the Act, 21 U.S.C.section 360bbb-3(b)(1), unless the authorization is terminated  or revoked sooner.       Influenza A by PCR NEGATIVE NEGATIVE Final   Influenza B by PCR NEGATIVE NEGATIVE Final    Comment: (NOTE) The Xpert Xpress SARS-CoV-2/FLU/RSV plus assay is intended as an aid in the diagnosis of influenza from Nasopharyngeal swab specimens and should not be used as a sole basis for treatment. Nasal washings and aspirates are unacceptable for Xpert Xpress SARS-CoV-2/FLU/RSV testing.  Fact Sheet for Patients: EntrepreneurPulse.com.au  Fact Sheet for Healthcare Providers: IncredibleEmployment.be  This test is not yet approved or cleared by the [place] FDA and has been authorized for detection and/or diagnosis of SARS-CoV-2 by FDA under an Emergency Use Authorization (EUA). This EUA will remain in effect (meaning this test can be used) for the duration of the COVID-19 declaration under Section 564(b)(1) of the Act, 21 U.S.C. section 360bbb-3(b)(1), unless the authorization is terminated or revoked.  Performed at [facility], [place] [address] [place], [place] [address]           Radiology Studies: US RENAL  Result Date: 10/22/2021 CLINICAL DATA:  Hematuria EXAM: RENAL /  URINARY TRACT ULTRASOUND COMPLETE COMPARISON:  CT 10/20/2021 FINDINGS: Right Kidney: Renal measurements: 10.5 x 5.3 x 4.0 cm = volume: 115.2 mL. Echogenicity within normal limits. No mass or hydronephrosis visualized. Left Kidney: Renal measurements: 10.7 x  5.4 x 4 cm = volume: 121.3 mL. Echogenicity within normal limits. No hydronephrosis. Cyst at the upper pole measuring 20 mm. Bladder: Decompressed by Foley catheter. Other: Heterogeneous mass in the right pelvis measuring 6.4 x 4.4 x 6.4 cm, corresponding to right pelvic hematoma. IMPRESSION: 1. Small simple cyst left kidney.  Kidneys are otherwise normal. 2. Urinary bladder is decompressed by Foley catheter 3. 6.4 cm heterogeneous right pelvic mass presumably corresponds to the large hematoma noted on prior CT Electronically Signed   By: Donavan Foil M.D.   On: 10/22/2021 18:29        Scheduled Meds:  atorvastatin  40 mg Oral QPM   buPROPion  150 mg Oral QHS   buPROPion  300 mg Oral Daily   Chlorhexidine Gluconate Cloth  6 each Topical Daily   donepezil  5 mg Oral Daily   fluticasone  1 spray Each Nare Daily   insulin aspart  0-9 Units Subcutaneous TID WC   loratadine  10 mg Oral Daily   memantine  10 mg Oral BID   sodium chloride flush  3 mL Intravenous Q12H   Continuous Infusions:  sodium chloride       LOS: 2 days        Hosie Poisson, MD [facility]   To contact the attending provider between 7A-7P or the covering provider during after hours 7P-7A, please log into the web site [email or web address] and access using universal [place] password for that web site. If you do not have the password, please call the hospital operator.  10/23/2021, 4:18 PM

## 2021-10-23 NOTE — Progress Notes (Signed)
Pulled patients foley this am, has not peed since about 8am, MD notified.   Chrisandra Carota, RN 10/23/2021 12:35 PM

## 2021-10-24 ENCOUNTER — Other Ambulatory Visit: Payer: Self-pay

## 2021-10-24 ENCOUNTER — Encounter (HOSPITAL_COMMUNITY): Payer: Self-pay | Admitting: Physical Medicine and Rehabilitation

## 2021-10-24 ENCOUNTER — Inpatient Hospital Stay (HOSPITAL_COMMUNITY)
Admission: RE | Admit: 2021-10-24 | Discharge: 2021-11-03 | DRG: 560 | Disposition: A | Discharge status: Home Health | Payer: No Typology Code available for payment source | Source: Intra-hospital | Attending: Physical Medicine and Rehabilitation | Admitting: Physical Medicine and Rehabilitation

## 2021-10-24 DIAGNOSIS — Z95810 Presence of automatic (implantable) cardiac defibrillator: Secondary | ICD-10-CM | POA: Diagnosis not present

## 2021-10-24 DIAGNOSIS — Z8546 Personal history of malignant neoplasm of prostate: Secondary | ICD-10-CM

## 2021-10-24 DIAGNOSIS — R42 Dizziness and giddiness: Secondary | ICD-10-CM | POA: Diagnosis not present

## 2021-10-24 DIAGNOSIS — K5900 Constipation, unspecified: Secondary | ICD-10-CM | POA: Diagnosis not present

## 2021-10-24 DIAGNOSIS — Z888 Allergy status to other drugs, medicaments and biological substances status: Secondary | ICD-10-CM

## 2021-10-24 DIAGNOSIS — S32591D Other specified fracture of right pubis, subsequent encounter for fracture with routine healing: Principal | ICD-10-CM

## 2021-10-24 DIAGNOSIS — I5042 Chronic combined systolic (congestive) and diastolic (congestive) heart failure: Secondary | ICD-10-CM | POA: Diagnosis present

## 2021-10-24 DIAGNOSIS — R319 Hematuria, unspecified: Secondary | ICD-10-CM | POA: Diagnosis not present

## 2021-10-24 DIAGNOSIS — F32A Depression, unspecified: Secondary | ICD-10-CM | POA: Diagnosis present

## 2021-10-24 DIAGNOSIS — W1830XA Fall on same level, unspecified, initial encounter: Secondary | ICD-10-CM | POA: Diagnosis not present

## 2021-10-24 DIAGNOSIS — E440 Moderate protein-calorie malnutrition: Secondary | ICD-10-CM | POA: Insufficient documentation

## 2021-10-24 DIAGNOSIS — E7800 Pure hypercholesterolemia, unspecified: Secondary | ICD-10-CM | POA: Diagnosis present

## 2021-10-24 DIAGNOSIS — W19XXXD Unspecified fall, subsequent encounter: Secondary | ICD-10-CM | POA: Diagnosis not present

## 2021-10-24 DIAGNOSIS — S32591A Other specified fracture of right pubis, initial encounter for closed fracture: Secondary | ICD-10-CM | POA: Diagnosis not present

## 2021-10-24 DIAGNOSIS — Z87891 Personal history of nicotine dependence: Secondary | ICD-10-CM | POA: Diagnosis not present

## 2021-10-24 DIAGNOSIS — Z681 Body mass index (BMI) 19 or less, adult: Secondary | ICD-10-CM | POA: Diagnosis not present

## 2021-10-24 DIAGNOSIS — Z882 Allergy status to sulfonamides status: Secondary | ICD-10-CM

## 2021-10-24 DIAGNOSIS — R338 Other retention of urine: Secondary | ICD-10-CM | POA: Diagnosis present

## 2021-10-24 DIAGNOSIS — Z7984 Long term (current) use of oral hypoglycemic drugs: Secondary | ICD-10-CM

## 2021-10-24 DIAGNOSIS — S32591S Other specified fracture of right pubis, sequela: Secondary | ICD-10-CM | POA: Diagnosis not present

## 2021-10-24 DIAGNOSIS — I447 Left bundle-branch block, unspecified: Secondary | ICD-10-CM | POA: Diagnosis present

## 2021-10-24 DIAGNOSIS — F03A0 Unspecified dementia, mild, without behavioral disturbance, psychotic disturbance, mood disturbance, and anxiety: Secondary | ICD-10-CM | POA: Diagnosis present

## 2021-10-24 DIAGNOSIS — S32599S Other specified fracture of unspecified pubis, sequela: Secondary | ICD-10-CM | POA: Diagnosis not present

## 2021-10-24 DIAGNOSIS — D62 Acute posthemorrhagic anemia: Secondary | ICD-10-CM | POA: Diagnosis not present

## 2021-10-24 DIAGNOSIS — S32599A Other specified fracture of unspecified pubis, initial encounter for closed fracture: Secondary | ICD-10-CM | POA: Diagnosis not present

## 2021-10-24 DIAGNOSIS — R69 Illness, unspecified: Secondary | ICD-10-CM | POA: Diagnosis not present

## 2021-10-24 DIAGNOSIS — Z85828 Personal history of other malignant neoplasm of skin: Secondary | ICD-10-CM

## 2021-10-24 DIAGNOSIS — S065XAA Traumatic subdural hemorrhage with loss of consciousness status unknown, initial encounter: Secondary | ICD-10-CM | POA: Diagnosis not present

## 2021-10-24 DIAGNOSIS — I428 Other cardiomyopathies: Secondary | ICD-10-CM | POA: Diagnosis not present

## 2021-10-24 DIAGNOSIS — R339 Retention of urine, unspecified: Secondary | ICD-10-CM | POA: Diagnosis not present

## 2021-10-24 DIAGNOSIS — E119 Type 2 diabetes mellitus without complications: Secondary | ICD-10-CM | POA: Diagnosis present

## 2021-10-24 DIAGNOSIS — N401 Enlarged prostate with lower urinary tract symptoms: Secondary | ICD-10-CM | POA: Diagnosis present

## 2021-10-24 DIAGNOSIS — I441 Atrioventricular block, second degree: Secondary | ICD-10-CM | POA: Diagnosis not present

## 2021-10-24 DIAGNOSIS — Z79899 Other long term (current) drug therapy: Secondary | ICD-10-CM

## 2021-10-24 LAB — CBC
HCT: 34.3 % — ABNORMAL LOW (ref 39.0–52.0)
Hemoglobin: 11.4 g/dL — ABNORMAL LOW (ref 13.0–17.0)
MCH: 33.5 pg (ref 26.0–34.0)
MCHC: 33.2 g/dL (ref 30.0–36.0)
MCV: 100.9 fL — ABNORMAL HIGH (ref 80.0–100.0)
Platelets: 131 10*3/uL — ABNORMAL LOW (ref 150–400)
RBC: 3.4 MIL/uL — ABNORMAL LOW (ref 4.22–5.81)
RDW: 12.4 % (ref 11.5–15.5)
WBC: 7.5 10*3/uL (ref 4.0–10.5)
nRBC: 0 % (ref 0.0–0.2)

## 2021-10-24 LAB — BASIC METABOLIC PANEL
Anion gap: 6 (ref 5–15)
BUN: 13 mg/dL (ref 8–23)
CO2: 23 mmol/L (ref 22–32)
Calcium: 8.7 mg/dL — ABNORMAL LOW (ref 8.9–10.3)
Chloride: 106 mmol/L (ref 98–111)
Creatinine, Ser: 1.13 mg/dL (ref 0.61–1.24)
GFR, Estimated: >60 mL/min (ref >60)
Glucose, Bld: 147 mg/dL — ABNORMAL HIGH (ref 70–99)
Potassium: 4.2 mmol/L (ref 3.5–5.1)
Sodium: 135 mmol/L (ref 135–145)

## 2021-10-24 LAB — GLUCOSE, CAPILLARY
Glucose-Capillary: 141 mg/dL — ABNORMAL HIGH (ref 70–99)
Glucose-Capillary: 122 mg/dL — ABNORMAL HIGH (ref 70–99)
Glucose-Capillary: 170 mg/dL — ABNORMAL HIGH (ref 70–99)
Glucose-Capillary: 117 mg/dL — ABNORMAL HIGH (ref 70–99)
Glucose-Capillary: 124 mg/dL — ABNORMAL HIGH (ref 70–99)
Glucose-Capillary: 172 mg/dL — ABNORMAL HIGH (ref 70–99)

## 2021-10-24 MED ORDER — MEMANTINE HCL 10 MG PO TABS
10.0000 mg | ORAL_TABLET | Freq: Two times a day (BID) | ORAL | Status: DC
  Administered 2021-10-24 – 2021-11-03 (×20): 10 mg via ORAL
  Filled 2021-10-24 (×22): qty 1

## 2021-10-24 MED ORDER — HYDROCODONE-ACETAMINOPHEN 5-325 MG PO TABS
1.0000 | ORAL_TABLET | ORAL | Status: DC | PRN
  Administered 2021-10-24: 1 via ORAL
  Administered 2021-10-25 – 2021-10-27 (×10): 2 via ORAL
  Administered 2021-10-28: 22:00:00 1 via ORAL
  Administered 2021-10-28 – 2021-10-30 (×9): 2 via ORAL
  Administered 2021-10-31: 21:00:00 1 via ORAL
  Administered 2021-11-01: 09:00:00 2 via ORAL
  Administered 2021-11-02: 08:00:00 1 via ORAL
  Administered 2021-11-02: 21:00:00 2 via ORAL
  Filled 2021-10-24 (×6): qty 2
  Filled 2021-10-24: qty 1
  Filled 2021-10-24 (×9): qty 2
  Filled 2021-10-24: qty 1
  Filled 2021-10-24 (×2): qty 2
  Filled 2021-10-24: qty 1
  Filled 2021-10-24 (×8): qty 2

## 2021-10-24 MED ORDER — ACETAMINOPHEN 325 MG PO TABS
650.0000 mg | ORAL_TABLET | Freq: Four times a day (QID) | ORAL | Status: DC | PRN
  Administered 2021-10-24 – 2021-10-31 (×8): 650 mg via ORAL
  Filled 2021-10-24 (×8): qty 2

## 2021-10-24 MED ORDER — BUPROPION HCL ER (XL) 300 MG PO TB24
300.0000 mg | ORAL_TABLET | Freq: Every day | ORAL | Status: DC
  Administered 2021-10-25 – 2021-11-03 (×10): 300 mg via ORAL
  Filled 2021-10-24 (×10): qty 1

## 2021-10-24 MED ORDER — BUPROPION HCL ER (XL) 150 MG PO TB24
150.0000 mg | ORAL_TABLET | Freq: Every day | ORAL | Status: DC
  Administered 2021-10-24 – 2021-11-02 (×10): 150 mg via ORAL
  Filled 2021-10-24 (×12): qty 1

## 2021-10-24 MED ORDER — FLUTICASONE PROPIONATE 50 MCG/ACT NA SUSP: 1.0000 | Freq: Every day | NASAL | 2 refills | Status: DC

## 2021-10-24 MED ORDER — SALINE SPRAY 0.65 % NA SOLN: 1.0000 | NASAL | 0 refills | Status: DC | PRN

## 2021-10-24 MED ORDER — ONDANSETRON HCL 4 MG/2ML IJ SOLN: 4.0000 mg | Freq: Four times a day (QID) | INTRAMUSCULAR | Status: DC | PRN

## 2021-10-24 MED ORDER — DONEPEZIL HCL 10 MG PO TABS
5.0000 mg | ORAL_TABLET | Freq: Every day | ORAL | Status: DC
  Administered 2021-10-25 – 2021-11-03 (×10): 5 mg via ORAL
  Filled 2021-10-24 (×10): qty 1

## 2021-10-24 MED ORDER — LORAZEPAM 0.5 MG PO TABS
0.5000 mg | ORAL_TABLET | Freq: Three times a day (TID) | ORAL | Status: DC | PRN
  Administered 2021-10-24 – 2021-11-02 (×6): 0.5 mg via ORAL
  Filled 2021-10-24 (×6): qty 1

## 2021-10-24 MED ORDER — CLOPIDOGREL BISULFATE 75 MG PO TABS
75.0000 mg | ORAL_TABLET | Freq: Every day | ORAL | Status: DC
  Administered 2021-10-30 – 2021-11-03 (×5): 75 mg via ORAL
  Filled 2021-10-24 (×5): qty 1

## 2021-10-24 MED ORDER — ONDANSETRON HCL 4 MG PO TABS
4.0000 mg | ORAL_TABLET | Freq: Four times a day (QID) | ORAL | Status: DC | PRN
  Administered 2021-10-26 – 2021-10-30 (×3): 4 mg via ORAL
  Filled 2021-10-24 (×4): qty 1

## 2021-10-24 MED ORDER — LORATADINE 10 MG PO TABS: 10.0000 mg | ORAL_TABLET | Freq: Every day | ORAL | Status: DC

## 2021-10-24 MED ORDER — VITAMIN D 25 MCG (1000 UNIT) PO TABS
1000.0000 [IU] | ORAL_TABLET | Freq: Every day | ORAL | Status: DC
  Administered 2021-10-24 – 2021-11-03 (×11): 1000 [IU] via ORAL
  Filled 2021-10-24 (×11): qty 1

## 2021-10-24 MED ORDER — INSULIN ASPART 100 UNIT/ML IJ SOLN
0.0000 [IU] | Freq: Three times a day (TID) | INTRAMUSCULAR | Status: DC
Start: 2021-10-25 — End: 2021-11-03
  Administered 2021-10-25: 13:00:00 1 [IU] via SUBCUTANEOUS
  Administered 2021-10-25: 17:00:00 2 [IU] via SUBCUTANEOUS
  Administered 2021-10-25 – 2021-10-26 (×3): 1 [IU] via SUBCUTANEOUS
  Administered 2021-10-26 – 2021-10-28 (×5): 2 [IU] via SUBCUTANEOUS
  Administered 2021-10-28 – 2021-10-30 (×5): 1 [IU] via SUBCUTANEOUS
  Administered 2021-10-30: 18:00:00 2 [IU] via SUBCUTANEOUS
  Administered 2021-10-31 – 2021-11-03 (×7): 1 [IU] via SUBCUTANEOUS

## 2021-10-24 MED ORDER — ACETAMINOPHEN 650 MG RE SUPP: 650.0000 mg | Freq: Four times a day (QID) | RECTAL | Status: DC | PRN

## 2021-10-24 MED ORDER — ATORVASTATIN CALCIUM 40 MG PO TABS
40.0000 mg | ORAL_TABLET | Freq: Every evening | ORAL | Status: DC
  Administered 2021-10-24 – 2021-11-02 (×10): 40 mg via ORAL
  Filled 2021-10-24 (×10): qty 1

## 2021-10-24 NOTE — Progress Notes (Signed)
INPATIENT REHABILITATION ADMISSION NOTE   Arrival Method: bed     Mental Orientation:alert   Assessment: done    Skin: done   IV'S: no    Pain:none   Tubes and Drains:none   Safety Measures:done   Vital Signs:done   Height and Weight:done   Rehab Orientation:done   Family:    Notes:

## 2021-10-24 NOTE — Progress Notes (Signed)
Inpatient Rehab Admissions Coordinator:  There is a bed available for pt in CIR today. Dr. Karleen [place] aware and in agreement. Pt, TOC, and NSG aware.   Gayland Curry, [place], [place] Admissions Coordinator [phone]

## 2021-10-24 NOTE — Progress Notes (Signed)
Patient distraught that he can not get his wife on the telephone. Stated he vomited (without evidence) and could not go to rehab because his wife was coming to get him because was too sick to do therapy. Pt given zofran for nausea (takes phenergan at home). Gave ativan (takes at home) to relieve anxiety. Patient has wife at bedside and understands that he is not expected to do physical therapy today, that he is being transferred down the hall in another department. Pt resting with call bell within reach.  Will continue to monitor.

## 2021-10-24 NOTE — H&P (Signed)
Physical Medicine and Rehabilitation Admission H&P        Chief Complaint  Patient presents with   Fall   Cold Exposure  : HPI: Kevin [name] is a 75 year old right-handed male with history of left bundle branch heart block status post pacemaker 2013 maintained on Plavix/nonischemic cardiomyopathy/chronic combined systolic diastolic congestive heart failure, hyperlipidemia, type 2 diabetes mellitus, prostate cancer, depression/memory loss maintained on Aricept/Namenda quit smoking 37 years ago.  Per chart review patient lives with spouse.  1 level home 2 steps to entry.  Modified independent and driving prior to admission.  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  Wife works part-time.  Presented 10/20/2021 after mechanical fall without loss of conscious.  He denied any chest pain or shortness of breath associated with the fall.  Cranial CT scan and cervical spine films negative.  CT of the right hip as well as CT angiography of abdomen and pelvis showed mildly displaced right superior and inferior pubic ramus fracture.  There was also a large hematoma within the right hemipelvis with adjacent fluid stranding measuring 10.9 x 5.8 x 8.3 cm.  As well as 2 foci of active bleeding in the right hemipelvis largest focus of bleeding along the right anterior aspect of the prostate.  Small focus of bleeding near the right superior pubic ramus.  There were areas of bleeding appeared to be supplied by a corona mortise branch that originates from the distal right external iliac artery.  Incidental findings of nodules left lung base largest measuring 3 mm no follow-up was needed patient considered low risk as no known or suspected primary neoplasm.  Patient underwent coil embolization of right pectineal branch artery per interventional radiology.  Gelfoam embolization of the replaced right obturator/corona mortise artery.  Orthopedic follow-up in regards to pubic ramus fracture weightbearing as  tolerated.  Hemoglobin remained stable at 10.4.  Patient with initial urinary retention possibly from pelvic hematoma Foley catheter tube removed for voiding trial and monitored.  Renal ultrasound unremarkable.  Therapy evaluations completed due to patient decreased functional mobility was admitted for a comprehensive rehab program.   Review of Systems  Constitutional:  Negative for chills and fever.  HENT:  Negative for hearing loss.   Eyes:  Negative for blurred vision and double vision.  Respiratory:  Negative for cough and shortness of breath.   Cardiovascular:  Negative for chest pain, palpitations and leg swelling.  Gastrointestinal:  Positive for constipation. Negative for heartburn, nausea and vomiting.  Genitourinary:  Positive for urgency. Negative for dysuria, flank pain and hematuria.  Musculoskeletal:  Positive for falls, joint pain and myalgias.  Skin:  Negative for rash.  Neurological:        Intermittent bouts of dizziness  Psychiatric/Behavioral:  Positive for depression and memory loss.   All other systems reviewed and are negative.     Past Medical History:  Diagnosis Date   Agent orange exposure      in [place]   Anemia     Heart block AV second degree      a. s/p PPM implant with subsequent CRTD upgrade   High cholesterol     History of colon polyps     LBBB (left bundle branch block)     Nonischemic cardiomyopathy ([place])      a. MDT CRTD upgrade 2016   Pacemaker 08/27/2013    Dual-chamber Medtronic Adapta implanted January 2013 for bradycardia with alternating bundle branch block and 2:1 atrioventricular block  Prostate cancer ([place])      "not been treated yet; on a wait and see" (01/22/2015)   Type II diabetes mellitus ([place])           Past Surgical History:  Procedure Laterality Date   BASAL CELL CARCINOMA EXCISION        "back, face, neck"   BI-VENTRICULAR IMPLANTABLE CARDIOVERTER DEFIBRILLATOR UPGRADE N/A 01/22/2015    MDT CRTD upgrade by Dr Rayann Heman    BIV ICD GENERATOR CHANGEOUT N/A 08/28/2020    Procedure: BIV ICD GENERATOR CHANGEOUT;  Surgeon: [name], MD;  Location: [facility];  Service: Cardiovascular;  Laterality: N/A;   CARDIAC CATHETERIZATION   01/27/2012    normal coronaries, dilated LV c/w nonischemic cardiomyopathy   EXAMINATION UNDER ANESTHESIA   04/06/2012    Procedure: EXAM UNDER ANESTHESIA;  Surgeon: Marcello Moores A. Cornett, MD;  Location: [facility];  Service: General;  Laterality: N/A;   INCISION AND DRAINAGE PERIRECTAL ABSCESS   ~ 2010; 2015   INGUINAL HERNIA REPAIR Left 1980   IR ANGIOGRAM PELVIS SELECTIVE OR SUPRASELECTIVE   10/20/2021   IR ANGIOGRAM VISCERAL SELECTIVE   10/20/2021   IR EMBO ART  VEN HEMORR LYMPH EXTRAV  INC GUIDE ROADMAPPING   10/20/2021   IR US GUIDE VASC ACCESS LEFT   10/20/2021   LEFT HEART CATHETERIZATION WITH CORONARY ANGIOGRAM N/A 01/27/2012    Procedure: LEFT HEART CATHETERIZATION WITH CORONARY ANGIOGRAM;  Surgeon: Troy Sine, MD;  Location: [facility];  Service: Cardiovascular;  Laterality: N/A;   NM MYOCAR PERF WALL MOTION   01/21/2012    mod-severe perfusion defect due to infarct/scar w/mild perinfarct ischemia in the apical,basal inferoseptal,basal inferior,mid inferoseptal,midinferior and apical inferior regions   pacemaker battery       PERMANENT PACEMAKER INSERTION N/A 12/01/2011    Medtronic implanted by Dr Sallyanne Kuster   PROSTATE BIOPSY       PROSTATE BIOPSY       shrapnel surgery        "in [place]"   TONSILLECTOMY             Family History  Problem Relation Age of Onset   Ovarian cancer Mother     Cancer Mother          ?uterine   Leukemia Father     Heart disease Father     Diabetes Neg Hx      Social History:  reports that he quit smoking about 37 years ago. His smoking use included cigarettes. He has a 20.00 pack-year smoking history. He has never used smokeless tobacco. He reports current alcohol use. He reports that he does not use drugs. Allergies:       Allergies   Allergen Reactions   Sulfa Antibiotics Swelling      Swelling of lips only.   5-Alpha Reductase Inhibitors            Medications Prior to Admission  Medication Sig Dispense Refill   acetaminophen (TYLENOL) 500 MG tablet Take 500 mg by mouth every 6 (six) hours as needed for moderate pain or headache.       atorvastatin (LIPITOR) 80 MG tablet Take 40 mg by mouth every evening.       buPROPion (WELLBUTRIN XL) 150 MG 24 hr tablet Take 2 tablets in AM, 1 tablet in PM (Patient taking differently: Take 150-300 mg by mouth See admin instructions. 300 mg in the morning  150 mg at bedtime) 270 tablet 3   Carboxymethylcellulose Sodium (EYE  DROPS OP) Place 1 drop into both eyes 3 (three) times daily. Given by [facility] doctor to help astigmtism       cholecalciferol (VITAMIN D) 25 MCG (1000 UNIT) tablet Take 1,000 Units by mouth daily.       clobetasol ointment (TEMOVATE) 8.50 % Apply 1 application topically daily as needed (itching).        donepezil (ARICEPT) 5 MG tablet Take 1 tablet (5 mg total) by mouth daily. 90 tablet 3   Empagliflozin-metFORMIN HCl ER (SYNJARDY XR) 03-999 MG TB24 Take 2 tablets by mouth daily. (Patient taking differently: Take 0.5 tablets by mouth 2 (two) times daily.) 60 tablet 2   LORazepam (ATIVAN) 0.5 MG tablet Take 0.5 mg by mouth every 8 (eight) hours as needed for anxiety.        memantine (NAMENDA) 10 MG tablet Take 1 tablet twice a day (Patient taking differently: Take 10 mg by mouth 2 (two) times daily.) 180 tablet 3   promethazine (PHENERGAN) 12.5 MG tablet Take 1 tablet (12.5 mg total) by mouth every 6 (six) hours as needed for nausea or vomiting. 30 tablet 0   VIAGRA 100 MG tablet TAKE ONE TABLET BY MOUTH AS NEEDED FOR ERECTILE DYSFUNCTION. (Patient taking differently: Take 50 mg by mouth as needed for erectile dysfunction.) 10 tablet 3   [DISCONTINUED] clopidogrel (PLAVIX) 75 MG tablet TAKE 1 TABLET(75 MG) BY MOUTH DAILY (Patient taking differently: Take 75 mg by mouth  daily.) 90 tablet 3   ACCU-CHEK COMPACT PLUS test strip use 1 TEST STRIP once daily as directed 102 each 5   Continuous Blood Gluc Receiver (FREESTYLE LIBRE 14 DAY READER) DEVI 1 each by Does not apply route See admin instructions. Use Freestyle Libre Reader to monitor blood sugars. 1 each 0   Continuous Blood Gluc Sensor (FREESTYLE LIBRE 14 DAY SENSOR) MISC CHANGE EVERY 14 DAYS AS DIRECTED OR NEEDED 6 each 2      Drug Regimen Review Drug regimen was reviewed and remains appropriate with no significant issues identified   Home: Home Living Family/patient expects to be discharged to:: Private residence Living Arrangements: Spouse/significant other Available Help at Discharge: Family Type of Home: House Home Access: Stairs to enter Technical brewer of Steps: 2 Entrance Stairs-Rails: None Home Layout: One level Bathroom Shower/Tub: Insurance claims handler: Other (comment) (wife says they may have RW but unsure) Additional Comments: Wife works part time across the street  Lives With: Spouse   Functional History: Prior Function Prior Level of Function : Independent/Modified Independent, Driving, Working/employed Mobility Comments: Ambulated without DME. 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 Driving to/from [place] for volunteering   Functional Status:  Mobility: Bed Mobility Overal bed mobility: Needs Assistance Bed Mobility: Supine to Sit, Sit to Supine Supine to sit: HOB elevated, Supervision Sit to supine: Supervision General bed mobility comments: able to progress BLE and elevate trunk using bedrails and increased time Transfers Overall transfer level: Needs assistance Equipment used: Rolling walker (2 wheels) Transfers: Sit to/from Stand, Bed to chair/wheelchair/BSC Sit to Stand: Mod assist Bed to/from chair/wheelchair/BSC transfer type:: Step pivot Step pivot  transfers: Mod assist General transfer comment: Stood 4x from recliner and once from EOB, heavy use of UE. Mod A for elevation, stability, and to gain upright posture. Cues for hand placement Ambulation/Gait Ambulation/Gait assistance: Min assist, Mod assist, +2 safety/equipment Gait Distance (Feet): 20 Feet (+18, +4) Assistive device: Rolling walker (2  wheels) Gait Pattern/deviations: Step-to pattern, Decreased stance time - right, Decreased weight shift to right, Trunk flexed, Antalgic, Knees buckling General Gait Details: Up to Heavy mod A for stability (+2 for chair follow), cues for RW management, pacing, and self-monitoring pain/fatigue. Pt did not request rest break, despite R knee starting to buckle. Gait velocity: decreased Pre-gait activities: Several marches in place, mod A for stability. Required a seated rest break prior to ambulating to chair.   ADL: ADL Overall ADL's : Needs assistance/impaired Eating/Feeding: Set up, Sitting Grooming: Supervision/safety, Sitting Upper Body Bathing: Minimal assistance, Sitting Lower Body Bathing: Maximal assistance, Sit to/from stand Upper Body Dressing : Minimal assistance, Sitting Lower Body Dressing: Maximal assistance, Sit to/from stand Lower Body Dressing Details (indicate cue type and reason): able to demo bending to reach top of socks as pt reports this is normally how he gets dressed. will need increased assist in standing to maintain balance/don over waist, as well as sequencing cues Toilet Transfer: Moderate assistance, Stand-pivot, Rolling walker (2 wheels) Toileting- Clothing Manipulation and Hygiene: Maximal assistance, Sit to/from stand General ADL Comments: Pt limited by cognition (frequent cues/redirection needed), as well as pain/impaired balance during activity   Cognition: Cognition Overall Cognitive Status: Impaired/Different from baseline Orientation Level: Oriented X4 Cognition Arousal/Alertness:  Awake/alert Behavior During Therapy: WFL for tasks assessed/performed Overall Cognitive Status: Impaired/Different from baseline Area of Impairment: Safety/judgement, Problem solving, Attention, Orientation, Memory, Following commands, Awareness Orientation Level: Disoriented to, Time, Situation Current Attention Level: Sustained Memory: Decreased short-term memory Following Commands: Follows one step commands with increased time, Follows one step commands consistently Safety/Judgement: Decreased awareness of safety, Decreased awareness of deficits Awareness: Intellectual Problem Solving: Slow processing, Difficulty sequencing, Requires verbal cues General Comments: Pt got days mixed up, thought today was his volunteer day. Pt said he hadn't broken anything when he fell, when he has mutliple fractures. Unable to determine when he needs a rest break despite cues.   Physical Exam: Blood pressure (!) 150/72, pulse 85, temperature (!) 97.5 F (36.4 C), temperature source Oral, resp. rate 20, height 6\' 4"  (1.93 m), weight 72.6 kg, SpO2 96 %. Physical Exam   General: No acute distress Mood and affect are appropriate Heart: Regular rate and rhythm no rubs murmurs or extra sounds Lungs: Clear to auscultation, breathing unlabored, no rales or wheezes Abdomen: Positive bowel sounds, soft nontender to palpation, nondistended Extremities: No clubbing, cyanosis, or edema Skin: No evidence of breakdown, no evidence of rash Neurologic: Cranial nerves II through XII intact, motor strength is 5/5 in bilateral deltoid, bicep, tricep, grip, 3 - right and 5/5 left hip flexor, knee extensors, 4/5 bilateral ankle dorsiflexor and plantar flexor Sensation reduced below the ankles bilateral feet  Musculoskeletal: Pain with right hip flexion, no evidence of knee effusions bilaterally normal upper extremity range of motion without pain  Lab Results Last 48 Hours        Results for orders placed or performed  during the hospital encounter of 10/20/21 (from the past 48 hour(s))  Glucose, capillary     Status: Abnormal    Collection Time: 10/21/21 12:14 PM  Result Value Ref Range    Glucose-Capillary 177 (H) 70 - 99 mg/dL      Comment: Glucose reference range applies only to samples taken after fasting for at least 8 hours.  Vitamin B12     Status: None    Collection Time: 10/21/21  1:54 PM  Result Value Ref Range    Vitamin B-12 306 180 - 914  pg/mL      Comment: (NOTE) This assay is not validated for testing neonatal or myeloproliferative syndrome specimens for Vitamin B12 levels. Performed at [facility], [place] [address] [place], [place] [address]    Folate     Status: None    Collection Time: 10/21/21  1:54 PM  Result Value Ref Range    Folate 11.0 >5.9 ng/mL      Comment: Performed at [facility], [place] [address] [place], [place] [address]  Iron and TIBC     Status: None    Collection Time: 10/21/21  1:54 PM  Result Value Ref Range    Iron 86 45 - 182 ug/dL    TIBC 287 250 - 450 ug/dL    Saturation Ratios 30 17.9 - 39.5 %    UIBC 201 ug/dL      Comment: Performed at [facility] [address] [place], [place] [address]  Ferritin     Status: None    Collection Time: 10/21/21  1:54 PM  Result Value Ref Range    Ferritin 108 24 - 336 ng/mL      Comment: Performed at [facility], [place] [address] [place], [place] [address]  Glucose, capillary     Status: Abnormal    Collection Time: 10/21/21  4:33 PM  Result Value Ref Range    Glucose-Capillary 156 (H) 70 - 99 mg/dL      Comment: Glucose reference range applies only to samples taken after fasting for at least 8 hours.  Glucose, capillary     Status: Abnormal    Collection Time: 10/21/21  9:08 PM  Result Value Ref Range    Glucose-Capillary 111 (H) 70 - 99 mg/dL      Comment: Glucose reference range applies only to samples taken after fasting for at least 8 hours.  Hemoglobin A1c     Status: Abnormal     Collection Time: 10/22/21  3:59 AM  Result Value Ref Range    Hgb A1c MFr Bld 6.7 (H) 4.8 - 5.6 %      Comment: (NOTE) Pre diabetes:          5.7%-6.4%   Diabetes:              >6.4%   Glycemic control for   <7.0% adults with diabetes      Mean Plasma Glucose 145.59 mg/dL      Comment: Performed at [facility] [address], [place], [place] [address]  CBC     Status: Abnormal    Collection Time: 10/22/21  3:59 AM  Result Value Ref Range    WBC 7.7 4.0 - 10.5 K/uL    RBC 3.15 (L) 4.22 - 5.81 MIL/uL    Hemoglobin 10.4 (L) 13.0 - 17.0 g/dL    HCT 32.0 (L) 39.0 - 52.0 %    MCV 101.6 (H) 80.0 - 100.0 fL    MCH 33.0 26.0 - 34.0 pg    MCHC 32.5 30.0 - 36.0 g/dL    RDW 12.5 11.5 - 15.5 %    Platelets 99 (L) 150 - 400 K/uL      Comment: Immature Platelet Fraction may be clinically indicated, consider ordering this additional test [id number] REPEATED TO VERIFY PLATELET COUNT CONFIRMED BY SMEAR      nRBC 0.0 0.0 - 0.2 %      Comment: Performed at [facility], [place] [address] [place], [place] [address]  Basic metabolic panel     Status: Abnormal    Collection Time: 10/22/21  3:59 AM  Result Value Ref Range    Sodium 138 135 - 145 mmol/L    Potassium 4.7 3.5 - 5.1 mmol/L    Chloride 105 98 - 111 mmol/L    CO2 28 22 - 32 mmol/L    Glucose, Bld 130 (H) 70 - 99 mg/dL      Comment: Glucose reference range applies only to samples taken after fasting for at least 8 hours.    BUN 12 8 - 23 mg/dL    Creatinine, Ser 1.39 (H) 0.61 - 1.24 mg/dL    Calcium 8.5 (L) 8.9 - 10.3 mg/dL    GFR, Estimated 53 (L) >60 mL/min      Comment: (NOTE) Calculated using the CKD-EPI Creatinine Equation (2021)      Anion gap 5 5 - 15      Comment: Performed at [facility] [address], [place], [place] [address]  Glucose, capillary     Status: Abnormal    Collection Time: 10/22/21  6:14 AM  Result Value Ref Range    Glucose-Capillary 125 (H) 70 - 99 mg/dL      Comment:  Glucose reference range applies only to samples taken after fasting for at least 8 hours.  Glucose, capillary     Status: Abnormal    Collection Time: 10/22/21 12:33 PM  Result Value Ref Range    Glucose-Capillary 152 (H) 70 - 99 mg/dL      Comment: Glucose reference range applies only to samples taken after fasting for at least 8 hours.    Comment 1 Notify RN      Comment 2 Document in Chart    Urinalysis, Routine w reflex microscopic Urine, Catheterized     Status: Abnormal    Collection Time: 10/22/21 12:48 PM  Result Value Ref Range    Color, Urine AMBER (A) YELLOW      Comment: BIOCHEMICALS MAY BE AFFECTED BY COLOR    APPearance CLOUDY (A) CLEAR    Specific Gravity, Urine 1.015 1.005 - 1.030    pH 7.5 5.0 - 8.0    Glucose, UA 250 (A) NEGATIVE mg/dL    Hgb urine dipstick LARGE (A) NEGATIVE    Bilirubin Urine SMALL (A) NEGATIVE    Ketones, ur NEGATIVE NEGATIVE mg/dL    Protein, ur 30 (A) NEGATIVE mg/dL    Nitrite NEGATIVE NEGATIVE    Leukocytes,Ua TRACE (A) NEGATIVE      Comment: Performed at [facility] [address], [place], [place] [address]  Urinalysis, Microscopic (reflex)     Status: Abnormal    Collection Time: 10/22/21 12:48 PM  Result Value Ref Range    RBC / HPF >50 0 - 5 RBC/hpf    WBC, UA 6-10 0 - 5 WBC/hpf    Bacteria, UA RARE (A) NONE SEEN    Squamous Epithelial / LPF NONE SEEN 0 - 5    Mucus PRESENT        Comment: Performed at [facility], [place] [address] [place], [place] [address]  Glucose, capillary     Status: Abnormal    Collection Time: 10/22/21  4:38 PM  Result Value Ref Range    Glucose-Capillary 173 (H) 70 - 99 mg/dL      Comment: Glucose reference range applies only to samples taken after fasting for at least 8 hours.    Comment 1 Notify RN  Comment 2 Document in Chart    Glucose, capillary     Status: Abnormal    Collection Time: 10/23/21  6:07 AM  Result Value Ref Range    Glucose-Capillary 149 (H) 70 - 99 mg/dL       Comment: Glucose reference range applies only to samples taken after fasting for at least 8 hours.       Imaging Results (Last 48 hours)  US RENAL   Result Date: 10/22/2021 CLINICAL DATA:  Hematuria EXAM: RENAL / URINARY TRACT ULTRASOUND COMPLETE COMPARISON:  CT 10/20/2021 FINDINGS: Right Kidney: Renal measurements: 10.5 x 5.3 x 4.0 cm = volume: 115.2 mL. Echogenicity within normal limits. No mass or hydronephrosis visualized. Left Kidney: Renal measurements: 10.7 x 5.4 x 4 cm = volume: 121.3 mL. Echogenicity within normal limits. No hydronephrosis. Cyst at the upper pole measuring 20 mm. Bladder: Decompressed by Foley catheter. Other: Heterogeneous mass in the right pelvis measuring 6.4 x 4.4 x 6.4 cm, corresponding to right pelvic hematoma. IMPRESSION: 1. Small simple cyst left kidney.  Kidneys are otherwise normal. 2. Urinary bladder is decompressed by Foley catheter 3. 6.4 cm heterogeneous right pelvic mass presumably corresponds to the large hematoma noted on prior CT Electronically Signed   By: Donavan Foil M.D.   On: 10/22/2021 18:29             Medical Problem List and Plan: 1. Functional deficits secondary to fall sustaining right superior inferior pubic ramus fracture/pelvic hematoma.  Status postembolization of right pectineal branch artery.  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  Weightbearing as tolerated.             -patient may  shower             -ELOS/Goals: 7-10d , sup/mod I 2.  Antithrombotics: -DVT/anticoagulation:  Mechanical: Antiembolism stockings, thigh (TED hose) Bilateral lower extremities             -antiplatelet therapy: Resume Plavix 75 mg daily 10/30/2021 3. Pain Management: Hydrocodone as needed 4. Mood: Aricept 5 mg daily, Wellbutrin 150 mg daily, 300 mg daily, Namenda 10 mg twice daily, Ativan every 8 hours as needed             -antipsychotic agents: N/A 5. Neuropsych: This patient is capable of making decisions on his  own behalf. 6. Skin/Wound Care: Routine skin checks 7. Fluids/Electrolytes/Nutrition: Routine in and outs with follow-up chemistries 8.  Acute blood loss anemia.  Follow-up CBC 9.  Hyperlipidemia.  Lipitor 10.  Chronic combined systolic diastolic congestive heart failure.  Monitor for any signs of fluid overload 11.  History of left bundle branch block.  Status post pacemaker 2013.  Follow-up outpatient 12.  Type 2 diabetes mellitus.  Hemoglobin A1c 6.7.  SSI.  Patient on Synjardy XR 2 tabs daily prior to admission.  Resume as needed 13.  Urinary retention/BPH.  Urinary tension possibly from pelvic hematoma.  Foley catheter tube removed.  Voiding trial.   Lavon Paganini Angiulli, PA-C 10/23/2021  "I have personally performed a face to face diagnostic evaluation of this patient.  Additionally, I have reviewed and concur with the physician assistant's documentation above." Charlett Blake M.D. [facility] Fellow Am Acad of Phys Med and Rehab Diplomate Am Board of Electrodiagnostic Med Fellow Am Board of Interventional Pain

## 2021-10-24 NOTE — Evaluation (Addendum)
Occupational Therapy Assessment and Plan  Patient Details  Name: Kevin [name] MRN: [id number] Date of Birth: Jul 15, 1946  OT Diagnosis: abnormal posture, acute pain, cognitive deficits, lumbago (low back pain), and muscle weakness (generalized) Rehab Potential: Rehab Potential (ACUTE ONLY): Good ELOS: 10-12 days  Today's Date: 10/25/2021 OT Individual Time: 0800-0909 OT Individual Time Calculation (min): 69 min     Hospital Problem: Principal Problem:   Pubic ramus fracture ([place]) Active Problems:   Fracture of multiple pubic rami with routine healing, right   Past Medical History:  Past Medical History:  Diagnosis Date   Agent orange exposure    in [place]   Anemia    Heart block AV second degree    a. s/p PPM implant with subsequent CRTD upgrade   High cholesterol    History of colon polyps    LBBB (left bundle branch block)    Nonischemic cardiomyopathy ([place])    a. MDT CRTD upgrade 2016   Pacemaker 08/27/2013   Dual-chamber Medtronic Adapta implanted January 2013 for bradycardia with alternating bundle branch block and 2:1 atrioventricular block    Prostate cancer ([place])    "not been treated yet; on a wait and see" (01/22/2015)   Type II diabetes mellitus ([place])    Past Surgical History:  Past Surgical History:  Procedure Laterality Date   BASAL CELL CARCINOMA EXCISION     "back, face, neck"   BI-VENTRICULAR IMPLANTABLE CARDIOVERTER DEFIBRILLATOR UPGRADE N/A 01/22/2015   MDT CRTD upgrade by Dr Rayann Heman   BIV ICD GENERATOR CHANGEOUT N/A 08/28/2020   Procedure: BIV ICD GENERATOR CHANGEOUT;  Surgeon: [name], MD;  Location: [facility];  Service: Cardiovascular;  Laterality: N/A;   CARDIAC CATHETERIZATION  01/27/2012   normal coronaries, dilated LV c/w nonischemic cardiomyopathy   EXAMINATION UNDER ANESTHESIA  04/06/2012   Procedure: EXAM UNDER ANESTHESIA;  Surgeon: Marcello Moores A. Cornett, MD;  Location: [facility];  Service: General;  Laterality: N/A;   INCISION  AND DRAINAGE PERIRECTAL ABSCESS  ~ 2010; 2015   INGUINAL HERNIA REPAIR Left 1980   IR ANGIOGRAM PELVIS SELECTIVE OR SUPRASELECTIVE  10/20/2021   IR ANGIOGRAM VISCERAL SELECTIVE  10/20/2021   IR EMBO ART  VEN HEMORR LYMPH EXTRAV  INC GUIDE ROADMAPPING  10/20/2021   IR US GUIDE VASC ACCESS LEFT  10/20/2021   LEFT HEART CATHETERIZATION WITH CORONARY ANGIOGRAM N/A 01/27/2012   Procedure: LEFT HEART CATHETERIZATION WITH CORONARY ANGIOGRAM;  Surgeon: Troy Sine, MD;  Location: [facility];  Service: Cardiovascular;  Laterality: N/A;   NM MYOCAR PERF WALL MOTION  01/21/2012   mod-severe perfusion defect due to infarct/scar w/mild perinfarct ischemia in the apical,basal inferoseptal,basal inferior,mid inferoseptal,midinferior and apical inferior regions   pacemaker battery     PERMANENT PACEMAKER INSERTION N/A 12/01/2011   Medtronic implanted by Dr Sallyanne Kuster   PROSTATE BIOPSY     PROSTATE BIOPSY     shrapnel surgery     "in [place]"   TONSILLECTOMY      Assessment & Plan Clinical Impression: Kevin Bartow. [name] is a 75 year old right-handed male with history of left bundle branch heart block status post pacemaker 2013 maintained on Plavix/nonischemic cardiomyopathy/chronic combined systolic diastolic congestive heart failure, hyperlipidemia, type 2 diabetes mellitus, prostate cancer, depression/memory loss maintained on Aricept/Namenda quit smoking 37 years ago.  Per chart review patient lives with spouse.  1 level home 2 steps to entry.  Modified independent and driving prior to admission.  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  Wife works part-time.  Presented 10/20/2021 after mechanical fall without loss of conscious.  He denied any chest pain or shortness of breath associated with the fall.  Cranial CT scan and cervical spine films negative.  CT of the right hip as well as CT angiography of abdomen and pelvis showed mildly displaced right superior and inferior pubic ramus  fracture.  There was also a large hematoma within the right hemipelvis with adjacent fluid stranding measuring 10.9 x 5.8 x 8.3 cm.  As well as 2 foci of active bleeding in the right hemipelvis largest focus of bleeding along the right anterior aspect of the prostate.  Small focus of bleeding near the right superior pubic ramus.  There were areas of bleeding appeared to be supplied by a corona mortise branch that originates from the distal right external iliac artery.  Incidental findings of nodules left lung base largest measuring 3 mm no follow-up was needed patient considered low risk as no known or suspected primary neoplasm.  Patient underwent coil embolization of right pectineal branch artery per interventional radiology.  Gelfoam embolization of the replaced right obturator/corona mortise artery.  Orthopedic follow-up in regards to pubic ramus fracture weightbearing as tolerated.  Hemoglobin remained stable at 10.4.  Patient with initial urinary retention possibly from pelvic hematoma Foley catheter tube removed for voiding trial and monitored.  Renal ultrasound unremarkable.  Therapy evaluations completed due to patient decreased functional mobility was admitted for a comprehensive rehab program.    Patient currently requires min-mod with basic self-care skills secondary to muscle weakness, decreased cardiorespiratoy endurance, decreased problem solving and decreased memory, and decreased standing balance and decreased balance strategies.  Prior to hospitalization, patient could complete BADLs with independent .  Patient will benefit from skilled intervention to increase independence with basic self-care skills prior to discharge home with care partner.  Anticipate patient will require 24 hour supervision and follow up home health.  OT - End of Session Endurance Deficit: Yes Endurance Deficit Description: Pt requesting to return to bed at close of session, feeling very fatigued and also a little  dizzy OT Assessment Rehab Potential (ACUTE ONLY): Good OT Barriers to Discharge: Decreased caregiver support;Lack of/limited family support OT Barriers to Discharge Comments: ?wife to provide 24/7 supervision OT Patient demonstrates impairments in the following area(s): Balance;Cognition;Endurance;Motor;Pain;Safety OT Basic ADL's Functional Problem(s): Grooming;Bathing;Dressing;Toileting OT Advanced ADL's Functional Problem(s): Simple Meal Preparation OT Transfers Functional Problem(s): Toilet OT Additional Impairment(s): None OT Plan OT Intensity: Minimum of 1-2 x/day, 45 to 90 minutes OT Frequency: 5 out of 7 days OT Duration/Estimated Length of Stay: 7-10 days OT Treatment/Interventions: Balance/vestibular training;Disease mangement/prevention;Self Care/advanced ADL retraining;Therapeutic Exercise;UE/LE Strength taining/ROM;Pain management;DME/adaptive equipment instruction;Cognitive remediation/compensation;Community reintegration;Patient/family education;UE/LE Coordination activities;Therapeutic Activities;Psychosocial support;Functional mobility training;Discharge planning OT Self Feeding Anticipated Outcome(s): No goal OT Basic Self-Care Anticipated Outcome(s): Supervision OT Toileting Anticipated Outcome(s): Supervision OT Bathroom Transfers Anticipated Outcome(s): Supervision OT Recommendation Recommendations for Other Services: Speech consult Patient destination: Home Follow Up Recommendations: Home health OT Equipment Recommended: To be determined   OT Evaluation Precautions/Restrictions  Precautions Precautions: Fall Precaution Comments: pacemaker Restrictions Weight Bearing Restrictions: No RLE Weight Bearing: Weight bearing as tolerated General PT Missed Treatment Reason: Patient ill (Comment) (pt dizzy and nauseous and request to try more later) Vital Signs Therapy Vitals Temp: 98 F (36.7 C) Temp Source: Oral Pulse Rate: 83 Resp: 17 BP: 134/74 Patient  Position (if appropriate): Lying Oxygen Therapy SpO2: 96 % O2 Device: Room Air Pain: in lower back and pelvis, RN in at  start of session to provide pain medicine.  Pain Assessment Pain Score: 2  Home Living/Prior Functioning Home Living Family/patient expects to be discharged to:: Private residence Living Arrangements: Spouse/significant other Available Help at Discharge: Family (Per pt, Olegario Shearer works in the morning at school and is finished by 12pm) Type of Home: House Home Access: Stairs to enter Technical brewer of Steps: 2 Entrance Stairs-Rails: Can reach both Home Layout: One level Bathroom Shower/Tub: Holiday representative Accessibility: Yes Additional Comments: Bathroom is walker accessible per pt  Lives With: Spouse Olegario Shearer) IADL History Occupation: Retired Type of Occupation: Geophysicist/field seismologist Leisure and Hobbies: volunteering at [employer]: Independent with basic ADLs  Able to [place]?: Yes Driving: Yes Vocation: Other (Comment) 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 Vision Baseline Vision/History: 1 Wears glasses Ability to See in Adequate Light: 1 Impaired Patient Visual Report: No change from baseline Vision Assessment?: No apparent visual deficits Perception  Perception: Within Functional Limits Praxis Praxis: Intact Cognition Overall Cognitive Status: Impaired/Different from baseline Arousal/Alertness: Lethargic Orientation Level: Person;Situation;Place Person: Oriented Place: Oriented Situation: Oriented Year: 2022 Month: December Day of Week: Correct Memory: Impaired Immediate Memory Recall: Sock;Bed;Blue Memory Recall Sock: Without Cue Memory Recall Blue: With Cue Memory Recall Bed: With Cue Problem Solving: Appears intact Safety/Judgment: Impaired Sensation Sensation Light Touch: Appears Intact Proprioception: Appears Intact Coordination Gross Motor Movements are Fluid and  Coordinated: No Fine Motor Movements are Fluid and Coordinated: Yes Coordination and Movement Description: Coordination during stands and functional transfers affected by pain and impaired balance Finger Nose Finger Test: WNL bilaterally Motor  Motor Motor: Other (comment) Motor - Skilled Clinical Observations: Global weakness  Trunk/Postural Assessment  Cervical Assessment Cervical Assessment: Exceptions to [facility] (forward head) Thoracic Assessment Thoracic Assessment: Exceptions to [facility] (rounded shoulders) Lumbar Assessment Lumbar Assessment: Exceptions to [facility] (posterior pelvic tilt) Postural Control Postural Control: Deficits on evaluation (mild impairment in standing)  Balance Balance Balance Assessed: Yes Dynamic Sitting Balance Dynamic Sitting - Balance Support: During functional activity Dynamic Sitting - Level of Assistance: 5: Stand by assistance (donning overhead shirt) Dynamic Standing Balance Dynamic Standing - Balance Support: During functional activity Dynamic Standing - Level of Assistance: 4: Min assist (ambulatory toilet transfer, using RW) Extremity/Trunk Assessment RUE Assessment RUE Assessment: Within Functional Limits Active Range of Motion (AROM) Comments: WNL LUE Assessment LUE Assessment: Within Functional Limits Active Range of Motion (AROM) Comments: WNL  Care Tool Care Tool Self Care Eating    Not assessed    Oral Care  Oral care, brush teeth, clean dentures activity did not occur: Refused (wanted to wait to do denture care with spouse)      Bathing   Body parts bathed by patient: Right arm;Left arm;Chest;Abdomen;Front perineal area;Buttocks;Right upper leg;Left upper leg;Face Body parts bathed by helper: Right lower leg;Left lower leg   Assist Level: Minimal Assistance - Patient > 75%    Upper Body Dressing(including orthotics)   What is the patient wearing?: Pull over shirt   Assist Level: Supervision/Verbal cueing    Lower Body Dressing  (excluding footwear)   What is the patient wearing?: Pants Assist for lower body dressing: Moderate Assistance - Patient 50 - 74%    Putting on/Taking off footwear   What is the patient wearing?: Non-skid slipper socks Assist for footwear: Maximal Assistance - Patient 25 - 49%       Care Tool Toileting Toileting activity Toileting Activity did not occur (Clothing management and hygiene only): N/A (no void  or bm)       Care Tool Bed Mobility Roll left and right activity   Roll left and right assist level: Contact Guard/Touching assist    Sit to lying activity   Sit to lying assist level: Minimal Assistance - Patient > 75%    Lying to sitting on side of bed activity   Lying to sitting on side of bed assist level: the ability to move from lying on the back to sitting on the side of the bed with no back support.: Minimal Assistance - Patient > 75%       Toilet transfer   Assist Level: Minimal Assistance - Patient > 75%     Care Tool Cognition  Expression of Ideas and Wants Expression of Ideas and Wants: 3. Some difficulty - exhibits some difficulty with expressing needs and ideas (e.g, some words or finishing thoughts) or speech is not clear  Understanding Verbal and Non-Verbal Content Understanding Verbal and Non-Verbal Content: 3. Usually understands - understands most conversations, but misses some part/intent of message. Requires cues at times to understand   Memory/Recall Ability Memory/Recall Ability : Current season;That he or she is in a hospital/hospital unit   Refer to Care Plan for Long Term Goals   Recommendations for other services: None    Skilled Therapeutic Intervention Skilled OT session completed with focus on initial evaluation, education on OT role/POC, and establishment of patient-centered goals.   Pt greeted in bed, requesting to get out of bed, motivated to participate this AM. RN in at start of session to provide pain medicine. Pt was then set up to  engage in ADLs EOB at sit<stand level using RW for standing support. Min A for sit<stands and for dynamic standing balance during LB self care. Pt with pain when leaning/bending forward, therefore he needed assistance with donning pants and footwear. Noted some memory deficits in the functional context, had to remind pt of a certain instruction that was given or if he had already done something. Pt verbalized awareness of his memory deficit. Simulated toilet transfer completed using the RW at ambulatory level with Min A, pt with antalgic movement and heavy UE reliance on device for stability. He then asked to return to bed due to fatigue at this time, supervision for scooting up towards [place]. Pt was left with all needs within reach and bed alarm set.   Pt also c/o mild dizziness and headache during activity today. Provided pt with aromatherapy to address headache therapeutically, informed RN of dizziness.   ADL ADL Eating: Not assessed Grooming: Setup Where Assessed-Grooming: Edge of bed Upper Body Bathing: Supervision/safety Where Assessed-Upper Body Bathing: Edge of bed Lower Body Bathing: Minimal assistance Where Assessed-Lower Body Bathing: Edge of bed Upper Body Dressing: Supervision/safety Where Assessed-Upper Body Dressing: Edge of bed Lower Body Dressing: Moderate assistance;Maximal assistance Where Assessed-Lower Body Dressing: Edge of bed Toileting: Not assessed Toilet Transfer: Minimal assistance Toilet Transfer Method: Ambulating (RW) Tub/Shower Transfer: Not assessed Mobility  Bed Mobility Bed Mobility: Rolling Right;Supine to Sit;Sit to Supine Rolling Right: Contact Guard/Touching assist Supine to Sit: Minimal Assistance - Patient > 75% Sit to Supine: Minimal Assistance - Patient > 75%   Discharge Criteria: Patient will be discharged from OT if patient refuses treatment 3 consecutive times without medical reason, if treatment goals not met, if there is a change in medical  status, if patient makes no progress towards goals or if patient is discharged from hospital.  The above assessment, treatment plan, treatment alternatives and  goals were discussed and mutually agreed upon: by patient  Skeet Simmer 10/25/2021, 12:58 PM

## 2021-10-24 NOTE — Discharge Summary (Signed)
Physician Discharge Summary  MAK BONNY UGQ:[id number] DOB: 05-22-1946 DOA: 10/20/2021  PCP: [name], MD  Admit date: 10/20/2021 Discharge date: 10/24/2021  Admitted From: Home.  Disposition:  CIR  Recommendations for Outpatient Follow-up:  Follow up with PCP in 1-2 weeks Please obtain BMP/CBC in one week Please follow up with IR as recommended.   Discharge Condition: Guarded.  CODE STATUS:DNR Diet recommendation: Heart Healthy / Carb Modified   Brief/Interim Summary: Kevin [name] is a 75 y.o. 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  Discharge Diagnoses:  Principal Problem:   Fall Active Problems:   Hypertension   DM2 (diabetes mellitus, type 2) (HCC)   Hyperlipidemia   Chronic combined systolic and diastolic heart failure (HCC)   Pelvic hematoma in male   Pubic ramus fracture (HCC)   Acute blood loss anemia  Mechanical fall with pelvic hematoma, right superior and inferior ramus fracture:  - s/p Pelvic angiogram, coil embolization of right pectineal branch artery. Gelfoam embolization of the replaced right obturator/corona mortis artery.  - pain control and therapy eval recommending CIR.  Recommend holding plavix for one week and to be resumed on 10/30/21.      Chronic combined systolic and diastolic heart failure:  He appears to be stable/ Euvolemic.       Acute blood loss anemia post op/ Hematoma: -drop in hemoglobin 14 to 10.6.  - hemoglobin remains stable around 10.  - transfuse to keep hemoglobin greater than 8.        Hypertension:  BP parameters are optimal.      Type 2 DM, with hyperglycemia: Resume home meds.   Hemoglobin A1c is 6.7%       Vascular dementia:  Resume  aricept and namenda.        Hyperlipidemia:  Resume statin.    Stage 3 a CKD:  Creatinine stable between 1.2 to 1.3   Urinary retention: possibly from pelvic hematoma.  S/p foley catheter placement. , foley discontinued today, voiding trial today.  Urine cultures Negative. Hematuria resolved.  US Renal unremarkable.       Discharge Instructions  Discharge Instructions     Diet - low sodium heart healthy   Complete by: As directed    Discharge instructions   Complete by: As directed    Please follow up with PCP in one week.   No wound care   Complete by: As directed       Allergies as of 10/24/2021       Reactions   Sulfa Antibiotics Swelling   Swelling of lips only.   5-alpha Reductase Inhibitors         Medication List     STOP taking these medications    Viagra 100 MG tablet Generic drug: sildenafil       TAKE these medications    Accu-Chek Compact Plus test strip Generic drug: glucose blood use 1 TEST STRIP once daily as directed   acetaminophen 500 MG tablet Commonly known as: TYLENOL Take 500 mg by mouth every 6 (six) hours as needed for moderate pain or headache.   atorvastatin 80 MG tablet Commonly known as: LIPITOR Take 40 mg by mouth every evening.   buPROPion 150 MG 24 hr tablet Commonly known as: WELLBUTRIN XL Take 2 tablets in AM, 1  tablet in PM What changed:  how much to take how to take this when to take this additional instructions   cholecalciferol 25 MCG (1000 UNIT) tablet Commonly known as: VITAMIN D Take 1,000 Units by mouth daily.   clobetasol ointment 0.05 % Commonly known as: TEMOVATE Apply 1 application topically daily as needed (itching).   clopidogrel 75 MG tablet Commonly known as: PLAVIX Take 1 tablet (75 mg total) by mouth daily. Start taking on: October 30, 2021 What changed:  how much to take how to take this when to take this additional instructions These instructions start on October 30, 2021. If you are unsure what to do until then, ask your doctor or other care provider.   donepezil 5 MG tablet Commonly known as: ARICEPT Take 1 tablet (5 mg total) by mouth daily.   EYE DROPS OP Place 1 drop into both eyes 3 (three) times daily. Given by [facility] doctor to help astigmtism   fluticasone 50 MCG/ACT nasal spray Commonly known as: FLONASE Place 1 spray into both nostrils daily. Start taking on: October 25, 2021   FreeStyle Libre 14 Day Reader Kerrin Mo 1 each by Does not apply route See admin instructions. Use Freestyle Libre Reader to monitor blood sugars.   FreeStyle Libre 14 Day Sensor Misc CHANGE EVERY 14 DAYS AS DIRECTED OR NEEDED   loratadine 10 MG tablet Commonly known as: CLARITIN Take 1 tablet (10 mg total) by mouth daily. Start taking on: October 25, 2021   LORazepam 0.5 MG tablet Commonly known as: ATIVAN Take 0.5 mg by mouth every 8 (eight) hours as needed for anxiety.   memantine 10 MG tablet Commonly known as: NAMENDA Take 1 tablet twice a day What changed:  how much to take how to take this when to take this additional instructions   promethazine 12.5 MG tablet Commonly known as: PHENERGAN Take 1 tablet (12.5 mg total) by mouth every 6 (six) hours as needed for nausea or vomiting.   sodium chloride 0.65 % Soln nasal spray Commonly known as: OCEAN Place 1 spray into both nostrils as needed for congestion.   Synjardy XR 03-999 MG Tb24 Generic drug: Empagliflozin-metFORMIN HCl ER Take 2 tablets by mouth daily. What changed:  how much to take when to take this        Allergies  Allergen Reactions   Sulfa Antibiotics Swelling    Swelling of lips only.   5-Alpha Reductase Inhibitors     Consultations: IR    Procedures/Studies: CT Head Wo Contrast  Result Date: 10/20/2021 CLINICAL DATA:  Head neck trauma.  Found on ground.  Hypothermia. EXAM: CT HEAD WITHOUT CONTRAST CT CERVICAL SPINE WITHOUT CONTRAST TECHNIQUE: Multidetector CT  imaging of the head and cervical spine was performed following the standard protocol without intravenous contrast. Multiplanar CT image reconstructions of the cervical spine were also generated. COMPARISON:  02/11/2020 head CT FINDINGS: CT HEAD FINDINGS Brain: No evidence of acute infarction, hemorrhage, hydrocephalus, extra-axial collection or mass lesion/mass effect. Remote left occipital and left cerebellar infarcts. Generalized brain atrophy. Vascular: Negative Skull: Normal. Negative for fracture or focal lesion. Sinuses/Orbits: No acute finding. CT CERVICAL SPINE FINDINGS Alignment: No traumatic malalignment Skull base and vertebrae: No acute fracture or aggressive bone lesion. Soft tissues and spinal canal: No prevertebral fluid or swelling. No visible canal hematoma. 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 Prominent adjacent disc and facet degeneration. Upper chest: Biapical pleural based scarring with calcification IMPRESSION: No evidence of acute intracranial or cervical spine injury. Electronically Signed  By: Jorje Guild M.D.   On: 10/20/2021 05:29   CT Cervical Spine Wo Contrast  Result Date: 10/20/2021 CLINICAL DATA:  Head neck trauma.  Found on ground.  Hypothermia. EXAM: CT HEAD WITHOUT CONTRAST CT CERVICAL SPINE WITHOUT CONTRAST TECHNIQUE: Multidetector CT imaging of the head and cervical spine was performed following the standard protocol without intravenous contrast. Multiplanar CT image reconstructions of the cervical spine were also generated. COMPARISON:  02/11/2020 head CT FINDINGS: CT HEAD FINDINGS Brain: No evidence of acute infarction, hemorrhage, hydrocephalus, extra-axial collection or mass lesion/mass effect. Remote left occipital and left cerebellar infarcts. Generalized brain atrophy. Vascular: Negative Skull: Normal. Negative for fracture or focal lesion. Sinuses/Orbits: No acute finding. CT CERVICAL SPINE FINDINGS Alignment: No traumatic malalignment Skull base and vertebrae: No acute  fracture or aggressive bone lesion. Soft tissues and spinal canal: No prevertebral fluid or swelling. No visible canal hematoma. 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 Prominent adjacent disc and facet degeneration. Upper chest: Biapical pleural based scarring with calcification IMPRESSION: No evidence of acute intracranial or cervical spine injury. Electronically Signed   By: Jorje Guild M.D.   On: 10/20/2021 05:29   IR Angiogram Visceral Selective  Result Date: 10/20/2021 INDICATION: 75 year old male status post fall with multiple pelvic fractures and pelvic hematoma with active extravasation. EXAM: 1. Ultrasound-guided vascular access of the left common femoral artery. 2. Catheterization angiography of the right common iliac artery, right external iliac artery, anomalous right obturator artery, and obturator pectineal branch artery. 3. Coil embolization of obturator pectineal branch artery. 4. 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 MEDICATIONS: None. ANESTHESIA/SEDATION: Moderate (conscious) sedation was employed during this procedure. A total of Versed 2 mg and Fentanyl 100 mcg was administered intravenously. Moderate Sedation Time: 39 minutes. The patient's level of consciousness and vital signs were monitored continuously by radiology nursing throughout the procedure under my direct supervision. FLUOROSCOPY TIME:  Fluoroscopy Time: 8.3 minutes (161 mGy). COMPLICATIONS: None immediate. PROCEDURE: Informed consent was obtained from the patient following explanation of the procedure, risks, benefits and alternatives. The patient understands, agrees and consents for the procedure. All questions were addressed. A time out was performed prior to the initiation of the procedure. Maximal barrier sterile technique utilized including caps, mask, sterile gowns, sterile gloves, large sterile drape, hand hygiene, and Betadine prep. The left groin was prepped and draped in standard fashion.  Preprocedure ultrasound evaluation demonstrated patency of the left common femoral artery. The procedure was planned. Subdermal Local anesthesia was provided with 1% lidocaine. A small skin nick was made. Under direct ultrasound visualization, the left common femoral artery was accessed with a 20 gauge micropuncture needle. A permanent image was captured and stored in the record. A micropuncture set was introduced and a limited left lower extremity angiogram was performed which demonstrated adequate puncture site for closure device use. A J wire was inserted over which the micropuncture set was exchanged for a 5 [place], 10 cm vascular sheath. An Omni Flush catheter was inserted over the wire to the level of the distal abdominal aorta. The wire was removed and exchanged for a Glidewire which was then positioned in the right external iliac artery. The Omni Flush catheter was advanced to the level of the common iliac artery. Angiogram was performed from the right common iliac artery which demonstrated wide patency of the inflow vessels. There is a focus of active extravasation arising just superior to the right pubic ramus which appears to arise from an replaced right obturator artery, otherwise known  as corona mortis, originating from the distal right external iliac artery. The Omni Flush catheter was exchanged over a J wire for a 5 French angled tip Kumpe the catheter. The Kumpe catheter was directed to the distal right external iliac artery. Through the Kumpe the catheter, a Lantern microcatheter and Fathom 16 microwire were inserted in used to select the corona mortis. Angiogram was performed from the proximal corona mortis which demonstrated active extravasation arising from a superior pectineal branch. There is also a larger focus of blushing without evidence of active extravasation just to the right word aspect of the pubic symphysis, presumed prostatic branch. The superior pectineal branch was then  catheterized. Repeat angiogram demonstrated persistent active extravasation/pseudoaneurysm. Coil embolization was then performed with 2, 1 x 4 mm low-profile Penumbra coils followed by a single 2 x 4 mm coil. The catheter was retracted slightly and repeat angiogram was performed which demonstrated complete embolization of the superior pectineal branch. The catheter was retracted into the proximal right corona mortis. A Gel-Foam slurry was then administered in until near complete hemostasis. The catheter was flushed and completion angiogram was performed from the proximal right corona mortis without evidence of persistent arterial blushing or active extravasation. The catheters were removed. A 6 French Angio-Seal device was used to close the left common femoral artery arteriotomy site which was deployed successfully. The distal pulses were unchanged. The patient tolerated the procedure well was transferred to the floor in stable condition. IMPRESSION: 1. 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 2. Technically successful coil embolization of the superior pectineal branch artery. 3. Technically successful Gel-Foam embolization of the replaced right obturator artery. Ruthann Cancer, MD [facility] Electronically Signed   By: Ruthann Cancer M.D.   On: 10/20/2021 16:50   IR Angiogram Pelvis Selective Or Supraselective  Result Date: 10/20/2021 INDICATION: 75 year old male status post fall with multiple pelvic fractures and pelvic hematoma with active extravasation. EXAM: 1. Ultrasound-guided vascular access of the left common femoral artery. 2. Catheterization angiography of the right common iliac artery, right external iliac artery, anomalous right obturator artery, and obturator pectineal branch artery. 3. Coil  embolization of obturator pectineal branch artery. 4. 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 MEDICATIONS: None. ANESTHESIA/SEDATION: Moderate (conscious) sedation was employed during this procedure. A total of Versed 2 mg and Fentanyl 100 mcg was administered intravenously. Moderate Sedation Time: 39 minutes. The patient's level of consciousness and vital signs were monitored continuously by radiology nursing throughout the procedure under my direct supervision. FLUOROSCOPY TIME:  Fluoroscopy Time: 8.3 minutes (161 mGy). COMPLICATIONS: None immediate. PROCEDURE: Informed consent was obtained from the patient following explanation of the procedure, risks, benefits and alternatives. The patient understands, agrees and consents for the procedure. All questions were addressed. A time out was performed prior to the initiation of the procedure. Maximal barrier sterile technique utilized including caps, mask, sterile gowns, sterile gloves, large sterile drape, hand hygiene, and Betadine prep. The left groin was prepped and draped in standard fashion. Preprocedure ultrasound evaluation demonstrated patency of the left common femoral artery. The procedure was planned. Subdermal Local anesthesia was provided with 1% lidocaine. A small skin nick was made. Under direct ultrasound visualization, the left common femoral artery was accessed with a 20 gauge micropuncture needle. A permanent image was captured and stored in the record. A micropuncture set was introduced and  a limited left lower extremity angiogram was performed which demonstrated adequate puncture site for closure device use. A J wire was inserted over which the micropuncture set was exchanged for a 5 [place], 10 cm vascular sheath. An Omni Flush catheter was inserted over the wire to the level of the distal abdominal aorta. The wire was removed and exchanged for a Glidewire which was then positioned in the right external iliac artery. The Omni  Flush catheter was advanced to the level of the common iliac artery. Angiogram was performed from the right common iliac artery which demonstrated wide patency of the inflow vessels. 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 The Omni Flush catheter was exchanged over a J wire for a 5 French angled tip Kumpe the catheter. The Kumpe catheter was directed to the distal right external iliac artery. Through the Kumpe the catheter, a Lantern microcatheter and Fathom 16 microwire were inserted in used to select the corona mortis. Angiogram was performed from the proximal corona mortis which demonstrated active extravasation arising from a superior pectineal branch. There is also a larger focus of blushing without evidence of active extravasation just to the right word aspect of the pubic symphysis, presumed prostatic branch. The superior pectineal branch was then catheterized. Repeat angiogram demonstrated persistent active extravasation/pseudoaneurysm. Coil embolization was then performed with 2, 1 x 4 mm low-profile Penumbra coils followed by a single 2 x 4 mm coil. The catheter was retracted slightly and repeat angiogram was performed which demonstrated complete embolization of the superior pectineal branch. The catheter was retracted into the proximal right corona mortis. A Gel-Foam slurry was then administered in until near complete hemostasis. The catheter was flushed and completion angiogram was performed from the proximal right corona mortis without evidence of persistent arterial blushing or active extravasation. The catheters were removed. A 6 French Angio-Seal device was used to close the left common femoral artery arteriotomy site which was deployed successfully. The distal pulses were unchanged. The patient  tolerated the procedure well was transferred to the floor in stable condition. IMPRESSION: 1. 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 2. Technically successful coil embolization of the superior pectineal branch artery. 3. Technically successful Gel-Foam embolization of the replaced right obturator artery. Ruthann Cancer, MD [facility] Electronically Signed   By: Ruthann Cancer M.D.   On: 10/20/2021 16:50   US RENAL  Result Date: 10/22/2021 CLINICAL DATA:  Hematuria EXAM: RENAL / URINARY TRACT ULTRASOUND COMPLETE COMPARISON:  CT 10/20/2021 FINDINGS: Right Kidney: Renal measurements: 10.5 x 5.3 x 4.0 cm = volume: 115.2 mL. Echogenicity within normal limits. No mass or hydronephrosis visualized. Left Kidney: Renal measurements: 10.7 x 5.4 x 4 cm = volume: 121.3 mL. Echogenicity within normal limits. No hydronephrosis. Cyst at the upper pole measuring 20 mm. Bladder: Decompressed by Foley catheter. Other: Heterogeneous mass in the right pelvis measuring 6.4 x 4.4 x 6.4 cm, corresponding to right pelvic hematoma. IMPRESSION: 1. Small simple cyst left kidney.  Kidneys are otherwise normal. 2. Urinary bladder is decompressed by Foley catheter 3. 6.4 cm heterogeneous right pelvic mass presumably corresponds to the large hematoma noted on prior CT Electronically Signed   By: Maudie Mercury  Francoise Ceo M.D.   On: 10/22/2021 18:29   CT Hip Right Wo Contrast  Result Date: 10/20/2021 CLINICAL DATA:  Hip trauma, fracture suspected, xray done EXAM: CT OF THE RIGHT HIP WITHOUT CONTRAST TECHNIQUE: Multidetector CT imaging of the right hip was performed according to the standard protocol. Multiplanar CT image reconstructions were also generated. COMPARISON:  Same day radiograph FINDINGS: Bones/Joint/Cartilage There are mildly displaced right  superior and inferior pubic ramus fractures. There is no evidence of femoral neck fracture. The acetabulum is intact. 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 No focal bone lesion identified. Ligaments Suboptimally assessed by CT. Muscles and Tendons No muscle atrophy. Soft tissues Within the right hemipelvis, there is a high-density 10.9 x 5.8 x 8.3 cm collection and adjacent fluid/stranding. There is a mass effect on the bladder. IMPRESSION: Mildly displaced right superior and inferior pubis ramus fractures. Large hematoma within the right hemipelvis with adjacent fluid/stranding, measuring 10.9 x 5.8 x 8.3 cm. Electronically Signed   By: Maurine Simmering M.D.   On: 10/20/2021 10:11   IR US Guide Vasc Access Left  Result Date: 10/20/2021 INDICATION: 75 year old male status post fall with multiple pelvic fractures and pelvic hematoma with active extravasation. EXAM: 1. Ultrasound-guided vascular access of the left common femoral artery. 2. Catheterization angiography of the right common iliac artery, right external iliac artery, anomalous right obturator artery, and obturator pectineal branch artery. 3. Coil embolization of obturator pectineal branch artery. 4. 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 MEDICATIONS: None. ANESTHESIA/SEDATION: Moderate (conscious) sedation was employed during this procedure. A total of Versed 2 mg and Fentanyl 100 mcg was administered intravenously. Moderate Sedation Time: 39 minutes. The patient's level of consciousness and vital signs were monitored continuously by radiology nursing throughout the procedure under my direct supervision. FLUOROSCOPY TIME:  Fluoroscopy Time: 8.3 minutes (161 mGy). COMPLICATIONS: None immediate. PROCEDURE: Informed consent was obtained from the patient following explanation of the procedure, risks, benefits and alternatives. The patient understands, agrees and consents for the procedure. All questions were addressed. A time out  was performed prior to the initiation of the procedure. Maximal barrier sterile technique utilized including caps, mask, sterile gowns, sterile gloves, large sterile drape, hand hygiene, and Betadine prep. The left groin was prepped and draped in standard fashion. Preprocedure ultrasound evaluation demonstrated patency of the left common femoral artery. The procedure was planned. Subdermal Local anesthesia was provided with 1% lidocaine. A small skin nick was made. Under direct ultrasound visualization, the left common femoral artery was accessed with a 20 gauge micropuncture needle. A permanent image was captured and stored in the record. A micropuncture set was introduced and a limited left lower extremity angiogram was performed which demonstrated adequate puncture site for closure device use. A J wire was inserted over which the micropuncture set was exchanged for a 5 [place], 10 cm vascular sheath. An Omni Flush catheter was inserted over the wire to the level of the distal abdominal aorta. The wire was removed and exchanged for a Glidewire which was then positioned in the right external iliac artery. The Omni Flush catheter was advanced to the level of the common iliac artery. Angiogram was performed from the right common iliac artery which demonstrated wide patency of the inflow vessels. 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 The Omni Flush catheter was  exchanged over a J wire for a 5 French angled tip Kumpe the catheter. The Kumpe catheter was directed to the distal right external iliac artery. Through the Kumpe the catheter, a Lantern microcatheter and Fathom 16 microwire were inserted in used to select the corona mortis. Angiogram was performed from the proximal corona mortis which demonstrated active  extravasation arising from a superior pectineal branch. There is also a larger focus of blushing without evidence of active extravasation just to the right word aspect of the pubic symphysis, presumed prostatic branch. The superior pectineal branch was then catheterized. Repeat angiogram demonstrated persistent active extravasation/pseudoaneurysm. Coil embolization was then performed with 2, 1 x 4 mm low-profile Penumbra coils followed by a single 2 x 4 mm coil. The catheter was retracted slightly and repeat angiogram was performed which demonstrated complete embolization of the superior pectineal branch. The catheter was retracted into the proximal right corona mortis. A Gel-Foam slurry was then administered in until near complete hemostasis. The catheter was flushed and completion angiogram was performed from the proximal right corona mortis without evidence of persistent arterial blushing or active extravasation. The catheters were removed. A 6 French Angio-Seal device was used to close the left common femoral artery arteriotomy site which was deployed successfully. The distal pulses were unchanged. The patient tolerated the procedure well was transferred to the floor in stable condition. IMPRESSION: 1. 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 2. Technically successful coil embolization of the superior pectineal branch artery. 3. Technically successful Gel-Foam embolization of the replaced right obturator artery. Ruthann Cancer, MD [facility] Radiology Electronically Signed   By: Ruthann Cancer M.D.   On: 10/20/2021 16:50   DG Chest Port 1 View  Result Date: 10/20/2021 CLINICAL DATA:  Sepsis evaluation. EXAM: PORTABLE CHEST 1 VIEW COMPARISON:  Portable chest 09/13/2016 FINDINGS: Cardiac size is normal. Left chest dual lead  pacing system with AID wiring is again noted and unchanged. The lungs are mildly emphysematous but clear of infiltrates. There are overlying wires and artifacts. No pleural effusion is seen. Mild osteopenia. IMPRESSION: No active disease.  Stable COPD chest. Electronically Signed   By: Telford Nab M.D.   On: 10/20/2021 06:06   DG Hip Port Unilat W or Wo Pelvis 1 View Right  Result Date: 10/20/2021 CLINICAL DATA:  75 year old male with history of trauma from a fall. Right-sided hip pain. EXAM: DG HIP (WITH OR WITHOUT PELVIS) 1V PORT RIGHT COMPARISON:  No priors. FINDINGS: Three views of the bony pelvis in the right hip demonstrate no definite acute displaced fracture, subluxation or dislocation. There is joint space narrowing, subchondral sclerosis, subchondral cyst formation and osteophyte formation in the hip joints bilaterally, indicative of moderate to severe osteoarthritis. IMPRESSION: 1. No acute radiographic abnormality of the bony pelvis or the right hip. 2. 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 Electronically Signed   By: Vinnie Langton M.D.   On: 10/20/2021 06:06   IR EMBO ART  VEN HEMORR LYMPH EXTRAV  INC GUIDE ROADMAPPING  Result Date: 10/20/2021 INDICATION: 75 year old male status post fall with multiple pelvic fractures and pelvic hematoma with active extravasation. EXAM: 1. Ultrasound-guided vascular access of the left common femoral artery. 2. Catheterization angiography of the right common iliac artery, right external iliac artery, anomalous right obturator artery, and obturator pectineal branch artery. 3. Coil embolization of obturator pectineal branch artery.  4. 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 MEDICATIONS: None. ANESTHESIA/SEDATION: Moderate (conscious) sedation was employed during this procedure. A total of Versed 2 mg and Fentanyl 100 mcg was administered intravenously. Moderate Sedation Time: 39 minutes. The patient's level of  consciousness and vital signs were monitored continuously by radiology nursing throughout the procedure under my direct supervision. FLUOROSCOPY TIME:  Fluoroscopy Time: 8.3 minutes (161 mGy). COMPLICATIONS: None immediate. PROCEDURE: Informed consent was obtained from the patient following explanation of the procedure, risks, benefits and alternatives. The patient understands, agrees and consents for the procedure. All questions were addressed. A time out was performed prior to the initiation of the procedure. Maximal barrier sterile technique utilized including caps, mask, sterile gowns, sterile gloves, large sterile drape, hand hygiene, and Betadine prep. The left groin was prepped and draped in standard fashion. Preprocedure ultrasound evaluation demonstrated patency of the left common femoral artery. The procedure was planned. Subdermal Local anesthesia was provided with 1% lidocaine. A small skin nick was made. Under direct ultrasound visualization, the left common femoral artery was accessed with a 20 gauge micropuncture needle. A permanent image was captured and stored in the record. A micropuncture set was introduced and a limited left lower extremity angiogram was performed which demonstrated adequate puncture site for closure device use. A J wire was inserted over which the micropuncture set was exchanged for a 5 [place], 10 cm vascular sheath. An Omni Flush catheter was inserted over the wire to the level of the distal abdominal aorta. The wire was removed and exchanged for a Glidewire which was then positioned in the right external iliac artery. The Omni Flush catheter was advanced to the level of the common iliac artery. Angiogram was performed from the right common iliac artery which demonstrated wide patency of the inflow vessels. There is a focus of active extravasation arising just superior to the right pubic ramus which appears to arise from an replaced right obturator artery, otherwise known as  corona mortis, originating from the distal right external iliac artery. The Omni Flush catheter was exchanged over a J wire for a 5 French angled tip Kumpe the catheter. The Kumpe catheter was directed to the distal right external iliac artery. Through the Kumpe the catheter, a Lantern microcatheter and Fathom 16 microwire were inserted in used to select the corona mortis. Angiogram was performed from the proximal corona mortis which demonstrated active extravasation arising from a superior pectineal branch. There is also a larger focus of blushing without evidence of active extravasation just to the right word aspect of the pubic symphysis, presumed prostatic branch. The superior pectineal branch was then catheterized. Repeat angiogram demonstrated persistent active extravasation/pseudoaneurysm. Coil embolization was then performed with 2, 1 x 4 mm low-profile Penumbra coils followed by a single 2 x 4 mm coil. The catheter was retracted slightly and repeat angiogram was performed which demonstrated complete embolization of the superior pectineal branch. The catheter was retracted into the proximal right corona mortis. A Gel-Foam slurry was then administered in until near complete hemostasis. The catheter was flushed and completion angiogram was performed from the proximal right corona mortis without evidence of persistent arterial blushing or active extravasation. The catheters were removed. A 6 French Angio-Seal device was used to close the left common femoral artery arteriotomy site which was deployed successfully. The distal pulses were unchanged. The patient tolerated the procedure well was transferred to the floor in stable condition. IMPRESSION: 1. Active extravasation from a superior pectineal branch arising from a  replaced right obturator artery stent arising from the distal right external iliac artery (AKA "corona mortis"). 2. Technically successful coil embolization of the superior pectineal branch  artery. 3. Technically successful Gel-Foam embolization of the replaced right obturator artery. Ruthann Cancer, MD [facility] Radiology Electronically Signed   By: Ruthann Cancer M.D.   On: 10/20/2021 16:50   CT Angio Abd/Pel w/ and/or w/o  Result Date: 10/20/2021 CLINICAL DATA:  Right pubic rami fractures. Hematoma in the right hemipelvis. Evaluate for ongoing bleeding. EXAM: CT ANGIOGRAPHY ABDOMEN AND PELVIS WITH CONTRAST TECHNIQUE: Multidetector CT imaging of the abdomen and pelvis was performed using the standard protocol during bolus administration of intravenous contrast. Multiplanar reconstructed images and MIPs were obtained and reviewed to evaluate the vascular anatomy. CONTRAST:  133mL OMNIPAQUE IOHEXOL 350 MG/ML SOLN COMPARISON:  CT right hip 10/20/2021 FINDINGS: VASCULAR Aorta: Normal caliber aorta without aneurysm, dissection, vasculitis or significant stenosis. Celiac: Patent without evidence of aneurysm, dissection, vasculitis or significant stenosis. SMA: Patent without evidence of aneurysm, dissection, vasculitis or significant stenosis. Renals: Both renal arteries are patent without evidence of aneurysm, dissection, vasculitis, fibromuscular dysplasia or significant stenosis. IMA: Patent without evidence of aneurysm, dissection, vasculitis or significant stenosis. Inflow: Patent without evidence of aneurysm, dissection, vasculitis or significant stenosis. Proximal Outflow: Proximal femoral arteries are patent bilaterally. There is a coronal mortis vascular variant originating from the distal right external iliac artery. Coronal mortis branches appear to be supplying areas of extravasation along the right side of the prostate on sequence 5 image 220 and near the right superior pubic ramus on sequence 5, image 208. These areas of contrast extravasation enlarge on the venous images. Veins: Portal venous system is patent. Hepatic veins are patent.  Renal veins are patent. Limited evaluation of the iliac veins due to the timing of the study. Review of the MIP images confirms the above findings. NON-VASCULAR Lower chest: Small nodules in the left lower lobe and some could be calcified. Largest nodule measures approximately 3 mm. Dependent changes in the lower lungs. No pleural effusions. Patient has a of ICD. Hepatobiliary: Normal appearance of the liver. No biliary dilatation. Small calcified gallstone at the gallbladder base without distention or inflammatory changes. Pancreas: Unremarkable. No pancreatic ductal dilatation or surrounding inflammatory changes. Spleen: Normal in size without focal abnormality. Adrenals/Urinary Tract: Normal adrenal glands. 1.8 cm exophytic left renal cyst. No suspicious renal lesions. Probable small cyst in the right kidney upper pole. Negative for hydronephrosis. Urinary bladder is displaced towards the left from the right hemipelvic hematoma. Stomach/Bowel: Stomach is within normal limits. Appendix appears normal. No evidence of bowel wall thickening, distention, or inflammatory changes. Lymphatic: No lymph node enlargement in the abdomen or pelvis. Reproductive: Calcifications in the prostate. Small focus contrast extravasation along the right anterior aspect of the prostate. Seminal vesicles are slightly displaced towards the left. Other: There is a large right-sided pelvic hematoma that measures 8.2 x 6.0 x 5.9 cm. Small focus of bleeding within this large pelvic hematoma. Evidence for contrast accumulating along the right anterior aspect of the prostate compatible with contrast extravasation and bleeding. There is additional blood in the posterior right hemipelvis and there is blood tracking up along the anterior lower abdomen and into the right paracolic gutter region. Musculoskeletal: Minimally displaced right superior pubic ramus fracture. Displaced right inferior pubic ramus fracture. Fracture of the right inferior  sacrum, best seen on sequence 3, image 848. Left pubic rami are intact. Both hips are located. Minimal  retrolisthesis of L4 on L5. Disc space narrowing at L3-L4 and L4-L5. IMPRESSION: VASCULAR 1. Two foci of active bleeding in the right hemipelvis. Largest focus of bleeding is along the right anterior aspect of the prostate. Smaller focus of bleeding is near the right superior pubic ramus. These areas of bleeding appear to be supplied by a corona mortis branch that originates from the distal right external iliac artery. NON-VASCULAR 1. Fractures of the right superior and right inferior pubic ramus. Right sacral fracture. Large right hemipelvic hematoma with blood extending into the lower abdomen. Bladder and seminal vesicles are displaced from the hematoma. 2. Small nonspecific nodules in left lung base, some of these could be calcified. Largest nodule measures 3 mm. No follow-up needed if patient is low-risk (and has no known or suspected primary neoplasm). Non-contrast chest CT can be considered in 12 months if patient is high-risk. This recommendation follows the consensus statement: Guidelines for Management of Incidental Pulmonary Nodules Detected on CT Images: From the Fleischner Society 2017; Radiology 2017; 284:228-243. 3. Cholelithiasis. No evidence for gallbladder distention or inflammation. These results were called by telephone at the time of interpretation on 10/20/2021 at 12:20 pm to provider Wynona Dove , who verbally acknowledged these results. Electronically Signed   By: Markus Daft M.D.   On: 10/20/2021 13:19      Subjective:  No new complaints. Hematuria resolved.  Discharge Exam: Vitals:   10/24/21 0725 10/24/21 1134  BP: (!) 144/76 137/76  Pulse: 84 89  Resp: 19 16  Temp: 97.9 F (36.6 C) 98 F (36.7 C)  SpO2: 94% 94%   Vitals:   10/23/21 2329 10/24/21 0323 10/24/21 0725 10/24/21 1134  BP: (!) 151/73 (!) 154/75 (!) 144/76 137/76  Pulse: 88 99 84 89  Resp: 17 16 19 16   Temp:  98 F (36.7 C) 97.8 F (36.6 C) 97.9 F (36.6 C) 98 F (36.7 C)  TempSrc: Oral Oral Oral Oral  SpO2: 94% 96% 94% 94%  Weight:  68.4 kg    Height:        General: Pt is alert, awake, not in acute distress Cardiovascular: RRR, S1/S2 +, no rubs, no gallops Respiratory: CTA bilaterally, no wheezing, no rhonchi Abdominal: Soft, NT, ND, bowel sounds + Extremities: no edema, no cyanosis    The results of significant diagnostics from this hospitalization (including imaging, microbiology, ancillary and laboratory) are listed below for reference.     Microbiology: Recent Results (from the past 240 hour(s))  Blood culture (routine x 2)     Status: None (Preliminary result)   Collection Time: 10/20/21  5:44 AM   Specimen: BLOOD  Result Value Ref Range Status   Specimen Description BLOOD RIGHT ANTECUBITAL  Final   Special Requests   Final    BOTTLES DRAWN AEROBIC AND ANAEROBIC Blood Culture adequate volume   Culture   Final    NO GROWTH 3 DAYS Performed at [facility], [address] [place], [place] [address]    Report Status PENDING  Incomplete  Blood culture (routine x 2)     Status: None (Preliminary result)   Collection Time: 10/20/21  5:49 AM   Specimen: BLOOD LEFT HAND  Result Value Ref Range Status   Specimen Description BLOOD LEFT HAND  Final   Special Requests   Final    BOTTLES DRAWN AEROBIC AND ANAEROBIC Blood Culture adequate volume   Culture   Final    NO GROWTH 3 DAYS Performed at [facility], [address], [place], [place] [address]    Report Status PENDING  Incomplete  Urine Culture     Status: None   Collection Time: 10/20/21  6:27 AM   Specimen: In/Out Cath Urine  Result Value Ref Range Status   Specimen Description IN/OUT CATH URINE  Final   Special Requests NONE  Final   Culture   Final    NO GROWTH Performed at [facility], [place] [address], [place], [place] [address]    Report Status 10/21/2021 FINAL  Final  Blood Culture  (routine x 2)     Status: None (Preliminary result)   Collection Time: 10/20/21  7:36 PM   Specimen: BLOOD  Result Value Ref Range Status   Specimen Description BLOOD RIGHT ANTECUBITAL  Final   Special Requests   Final    BOTTLES DRAWN AEROBIC AND ANAEROBIC Blood Culture results may not be optimal due to an excessive volume of blood received in culture bottles   Culture   Final    NO GROWTH 3 DAYS Performed at [facility], [place] [address] [place], [place] [address]    Report Status PENDING  Incomplete  Blood Culture (routine x 2)     Status: None (Preliminary result)   Collection Time: 10/20/21  7:39 PM   Specimen: BLOOD  Result Value Ref Range Status   Specimen Description BLOOD LEFT ANTECUBITAL  Final   Special Requests   Final    BOTTLES DRAWN AEROBIC AND ANAEROBIC Blood Culture adequate volume   Culture   Final    NO GROWTH 3 DAYS Performed at [facility], [place] [address], [place], [place] [address]    Report Status PENDING  Incomplete  Resp Panel by RT-PCR (Flu A&B, Covid) Nasopharyngeal Swab     Status: None   Collection Time: 10/21/21 12:26 AM   Specimen: Nasopharyngeal Swab; Nasopharyngeal(NP) swabs in vial transport medium  Result Value Ref Range Status   SARS Coronavirus 2 by RT PCR NEGATIVE NEGATIVE Final    Comment: (NOTE) SARS-CoV-2 target nucleic acids are NOT DETECTED.  The SARS-CoV-2 RNA is generally detectable in upper respiratory specimens during the acute phase of infection. The lowest concentration of SARS-CoV-2 viral copies this assay can detect is 138 copies/mL. A negative result does not preclude SARS-Cov-2 infection and should not be used as the sole basis for treatment or other patient management decisions. A negative result may occur with  improper specimen collection/handling, submission of specimen other than nasopharyngeal swab, presence of viral mutation(s) within the areas targeted by this assay, and inadequate number of  viral copies(<138 copies/mL). A negative result must be combined with clinical observations, patient history, and epidemiological information. The expected result is Negative.  Fact Sheet for Patients:  EntrepreneurPulse.com.au  Fact Sheet for Healthcare Providers:  IncredibleEmployment.be  This test is no t yet approved or cleared by the [place] FDA and  has been authorized for detection and/or diagnosis of SARS-CoV-2 by FDA under an Emergency Use Authorization (EUA). This EUA will remain  in effect (meaning this test can be used) for the duration of the COVID-19 declaration under Section 564(b)(1) of the Act, 21 U.S.C.section 360bbb-3(b)(1), unless the authorization is terminated  or revoked sooner.       Influenza A by PCR NEGATIVE NEGATIVE Final   Influenza B by PCR NEGATIVE NEGATIVE Final    Comment: (NOTE) The Xpert Xpress SARS-CoV-2/FLU/RSV plus assay is intended as an aid in the diagnosis of influenza  from Nasopharyngeal swab specimens and should not be used as a sole basis for treatment. Nasal washings and aspirates are unacceptable for Xpert Xpress SARS-CoV-2/FLU/RSV testing.  Fact Sheet for Patients: EntrepreneurPulse.com.au  Fact Sheet for Healthcare Providers: IncredibleEmployment.be  This test is not yet approved or cleared by the [place] FDA and has been authorized for detection and/or diagnosis of SARS-CoV-2 by FDA under an Emergency Use Authorization (EUA). This EUA will remain in effect (meaning this test can be used) for the duration of the COVID-19 declaration under Section 564(b)(1) of the Act, 21 U.S.C. section 360bbb-3(b)(1), unless the authorization is terminated or revoked.  Performed at [facility], [place] [address] [place], [place] [address]      Labs: BNP (last 3 results) No results for input(s): BNP in the last 8760 hours. Basic Metabolic  Panel: Recent Labs  Lab 10/20/21 0357 10/20/21 1938 10/21/21 0156 10/22/21 0359 10/24/21 0053  NA 138 138 135 138 135  K 3.9 4.4 4.6 4.7 4.2  CL 107 107 106 105 106  CO2 21* 25 25 28 23   GLUCOSE 209* 96 166* 130* 147*  BUN 15 14 14 12 13   CREATININE 1.32* 1.28* 1.25* 1.39* 1.13  CALCIUM 8.1* 8.4* 8.3* 8.5* 8.7*   Liver Function Tests: Recent Labs  Lab 10/20/21 1938  AST 19  ALT 11  ALKPHOS 66  BILITOT 1.0  PROT 5.2*  ALBUMIN 2.9*   No results for input(s): LIPASE, AMYLASE in the last 168 hours. No results for input(s): AMMONIA in the last 168 hours. CBC: Recent Labs  Lab 10/20/21 0357 10/20/21 1939 10/21/21 0156 10/22/21 0359 10/24/21 0053  WBC 16.8* 7.9 8.6 7.7 7.5  NEUTROABS 14.4*  --   --   --   --   HGB 14.2 11.4* 10.6* 10.4* 11.4*  HCT 43.2 35.1* 32.8* 32.0* 34.3*  MCV 102.6* 102.6* 101.5* 101.6* 100.9*  PLT 135* 104* 103* 99* 131*   Cardiac Enzymes: Recent Labs  Lab 10/20/21 0357  CKTOTAL 134   BNP: Invalid input(s): POCBNP CBG: Recent Labs  Lab 10/23/21 1630 10/23/21 2125 10/24/21 0611 10/24/21 0836 10/24/21 1220  GLUCAP 136* 130* 141* 122* 170*   D-Dimer No results for input(s): DDIMER in the last 72 hours. Hgb A1c Recent Labs    10/22/21 0359  HGBA1C 6.7*   Lipid Profile No results for input(s): CHOL, HDL, LDLCALC, TRIG, CHOLHDL, LDLDIRECT in the last 72 hours. Thyroid function studies No results for input(s): TSH, T4TOTAL, T3FREE, THYROIDAB in the last 72 hours.  Invalid input(s): FREET3 Anemia work up Recent Labs    10/21/21 1354  VITAMINB12 306  FOLATE 11.0  FERRITIN 108  TIBC 287  IRON 86   Urinalysis    Component Value Date/Time   COLORURINE AMBER (A) 10/22/2021 1248   APPEARANCEUR CLOUDY (A) 10/22/2021 1248   LABSPEC 1.015 10/22/2021 1248   PHURINE 7.5 10/22/2021 1248   GLUCOSEU 250 (A) 10/22/2021 1248   GLUCOSEU NEGATIVE 02/06/2020 1609   HGBUR LARGE (A) 10/22/2021 1248   BILIRUBINUR SMALL (A) 10/22/2021  1248   KETONESUR NEGATIVE 10/22/2021 1248   PROTEINUR 30 (A) 10/22/2021 1248   UROBILINOGEN 0.2 02/06/2020 1609   NITRITE NEGATIVE 10/22/2021 1248   LEUKOCYTESUR TRACE (A) 10/22/2021 1248   Sepsis Labs Invalid input(s): PROCALCITONIN,  WBC,  LACTICIDVEN Microbiology Recent Results (from the past 240 hour(s))  Blood culture (routine x 2)     Status: None (Preliminary result)   Collection Time: 10/20/21  5:44 AM  Specimen: BLOOD  Result Value Ref Range Status   Specimen Description BLOOD RIGHT ANTECUBITAL  Final   Special Requests   Final    BOTTLES DRAWN AEROBIC AND ANAEROBIC Blood Culture adequate volume   Culture   Final    NO GROWTH 3 DAYS Performed at [facility], [address], [place], [place] [address]    Report Status PENDING  Incomplete  Blood culture (routine x 2)     Status: None (Preliminary result)   Collection Time: 10/20/21  5:49 AM   Specimen: BLOOD LEFT HAND  Result Value Ref Range Status   Specimen Description BLOOD LEFT HAND  Final   Special Requests   Final    BOTTLES DRAWN AEROBIC AND ANAEROBIC Blood Culture adequate volume   Culture   Final    NO GROWTH 3 DAYS Performed at [facility], [place] [address], [place], [place] [address]    Report Status PENDING  Incomplete  Urine Culture     Status: None   Collection Time: 10/20/21  6:27 AM   Specimen: In/Out Cath Urine  Result Value Ref Range Status   Specimen Description IN/OUT CATH URINE  Final   Special Requests NONE  Final   Culture   Final    NO GROWTH Performed at [facility], [place] [address] [place], [place] [address]    Report Status 10/21/2021 FINAL  Final  Blood Culture (routine x 2)     Status: None (Preliminary result)   Collection Time: 10/20/21  7:36 PM   Specimen: BLOOD  Result Value Ref Range Status   Specimen Description BLOOD RIGHT ANTECUBITAL  Final   Special Requests   Final    BOTTLES DRAWN AEROBIC AND ANAEROBIC Blood Culture results may not be optimal due to  an excessive volume of blood received in culture bottles   Culture   Final    NO GROWTH 3 DAYS Performed at [facility], [place] [address], [place], [place] [address]    Report Status PENDING  Incomplete  Blood Culture (routine x 2)     Status: None (Preliminary result)   Collection Time: 10/20/21  7:39 PM   Specimen: BLOOD  Result Value Ref Range Status   Specimen Description BLOOD LEFT ANTECUBITAL  Final   Special Requests   Final    BOTTLES DRAWN AEROBIC AND ANAEROBIC Blood Culture adequate volume   Culture   Final    NO GROWTH 3 DAYS Performed at [facility], [place] [address], [place], [place] [address]    Report Status PENDING  Incomplete  Resp Panel by RT-PCR (Flu A&B, Covid) Nasopharyngeal Swab     Status: None   Collection Time: 10/21/21 12:26 AM   Specimen: Nasopharyngeal Swab; Nasopharyngeal(NP) swabs in vial transport medium  Result Value Ref Range Status   SARS Coronavirus 2 by RT PCR NEGATIVE NEGATIVE Final    Comment: (NOTE) SARS-CoV-2 target nucleic acids are NOT DETECTED.  The SARS-CoV-2 RNA is generally detectable in upper respiratory specimens during the acute phase of infection. The lowest concentration of SARS-CoV-2 viral copies this assay can detect is 138 copies/mL. A negative result does not preclude SARS-Cov-2 infection and should not be used as the sole basis for treatment or other patient management decisions. A negative result may occur with  improper specimen collection/handling, submission of specimen other than nasopharyngeal swab, presence of viral mutation(s) within the areas targeted by this assay, and inadequate number of viral copies(<138 copies/mL).  A negative result must be combined with clinical observations, patient history, and epidemiological information. The expected result is Negative.  Fact Sheet for Patients:  EntrepreneurPulse.com.au  Fact Sheet for Healthcare Providers:   IncredibleEmployment.be  This test is no t yet approved or cleared by the [place] FDA and  has been authorized for detection and/or diagnosis of SARS-CoV-2 by FDA under an Emergency Use Authorization (EUA). This EUA will remain  in effect (meaning this test can be used) for the duration of the COVID-19 declaration under Section 564(b)(1) of the Act, 21 U.S.C.section 360bbb-3(b)(1), unless the authorization is terminated  or revoked sooner.       Influenza A by PCR NEGATIVE NEGATIVE Final   Influenza B by PCR NEGATIVE NEGATIVE Final    Comment: (NOTE) The Xpert Xpress SARS-CoV-2/FLU/RSV plus assay is intended as an aid in the diagnosis of influenza from Nasopharyngeal swab specimens and should not be used as a sole basis for treatment. Nasal washings and aspirates are unacceptable for Xpert Xpress SARS-CoV-2/FLU/RSV testing.  Fact Sheet for Patients: EntrepreneurPulse.com.au  Fact Sheet for Healthcare Providers: IncredibleEmployment.be  This test is not yet approved or cleared by the [place] FDA and has been authorized for detection and/or diagnosis of SARS-CoV-2 by FDA under an Emergency Use Authorization (EUA). This EUA will remain in effect (meaning this test can be used) for the duration of the COVID-19 declaration under Section 564(b)(1) of the Act, 21 U.S.C. section 360bbb-3(b)(1), unless the authorization is terminated or revoked.  Performed at [facility], [place] [address], [place], [place] [address]      Time coordinating discharge: 36 minutes.   SIGNED:   Hosie Poisson, MD  [facility] 10/24/2021, 1:48 PM

## 2021-10-25 ENCOUNTER — Inpatient Hospital Stay (HOSPITAL_COMMUNITY): Payer: No Typology Code available for payment source

## 2021-10-25 DIAGNOSIS — S32591D Other specified fracture of right pubis, subsequent encounter for fracture with routine healing: Principal | ICD-10-CM

## 2021-10-25 DIAGNOSIS — S32599A Other specified fracture of unspecified pubis, initial encounter for closed fracture: Secondary | ICD-10-CM

## 2021-10-25 LAB — CULTURE, BLOOD (ROUTINE X 2)
Culture: NO GROWTH
Culture: NO GROWTH
Culture: NO GROWTH
Culture: NO GROWTH
Special Requests: ADEQUATE
Special Requests: ADEQUATE
Special Requests: ADEQUATE

## 2021-10-25 LAB — GLUCOSE, CAPILLARY
Glucose-Capillary: 122 mg/dL — ABNORMAL HIGH (ref 70–99)
Glucose-Capillary: 131 mg/dL — ABNORMAL HIGH (ref 70–99)
Glucose-Capillary: 163 mg/dL — ABNORMAL HIGH (ref 70–99)
Glucose-Capillary: 152 mg/dL — ABNORMAL HIGH (ref 70–99)

## 2021-10-25 NOTE — IPOC Note (Signed)
Overall Plan of Care [facility]) Patient Details Name: DENNIES COATE MRN: [id number] DOB: 05/22/1946  Admitting Diagnosis: Pubic ramus fracture [facility])  Hospital Problems: Principal Problem:   Pubic ramus fracture ([place]) Active Problems:   Fracture of multiple pubic rami with routine healing, right     Functional Problem List: Nursing Bladder, Endurance, Medication Management, Motor, Pain, Safety, Edema  PT Balance, Endurance, Motor, Pain, Safety  OT Balance, Cognition, Endurance, Motor, Pain, Safety  SLP    TR         Basic ADLs: OT Grooming, Bathing, Dressing, Toileting     Advanced  ADLs: OT Simple Meal Preparation     Transfers: PT Bed Mobility, Bed to Chair, Car, Furniture  OT Toilet     Locomotion: PT Ambulation, Emergency planning/management officer, Stairs     Additional Impairments: OT None  SLP        TR      Anticipated Outcomes Item Anticipated Outcome  Self Feeding No goal  Swallowing      Basic self-care  Media planner Transfers Supervision  Bowel/Bladder  supervision  Transfers  supervision overall  Locomotion  supervision household distances for gait with LRAD; min assist stairs  Communication     Cognition     Pain  <3  Safety/Judgment  supervision and no falls   Therapy Plan: PT Intensity: Minimum of 1-2 x/day ,45 to 90 minutes PT Frequency: 5 out of 7 days PT Duration Estimated Length of Stay: 10-12 days OT Intensity: Minimum of 1-2 x/day, 45 to 90 minutes OT Frequency: 5 out of 7 days OT Duration/Estimated Length of Stay: 7-10 days     Due to the current state of emergency, patients may not be receiving their 3-hours of Medicare-mandated therapy.   Team Interventions: Nursing Interventions Patient/Family Education, Bowel Management, Disease Management/Prevention, Pain Management, Medication Management, Discharge Planning  PT interventions Ambulation/gait training, Balance/vestibular training, Cognitive  remediation/compensation, Community reintegration, Discharge planning, Disease management/prevention, DME/adaptive equipment instruction, Functional mobility training, Neuromuscular re-education, Pain management, Patient/family education, Psychosocial support, Skin care/wound management, Splinting/orthotics, Stair training, Therapeutic Activities, UE/LE Strength taining/ROM, Therapeutic Exercise, UE/LE Coordination activities, Wheelchair propulsion/positioning  OT Interventions Balance/vestibular training, Disease mangement/prevention, Self Care/advanced ADL retraining, Therapeutic Exercise, UE/LE Strength taining/ROM, Pain management, DME/adaptive equipment instruction, Cognitive remediation/compensation, Community reintegration, Barrister's clerk education, UE/LE Coordination activities, Therapeutic Activities, Psychosocial support, Functional mobility training, Discharge planning  SLP Interventions    TR Interventions    SW/CM Interventions     Barriers to Discharge MD  Medical stability  Nursing Decreased caregiver support, Home environment access/layout, Incontinence, Lack of/limited family support, Medication compliance, Weight bearing restrictions Lives in 2 level home with 4 steps to enter and rails. Able to live on main level with bedroom/bathroom. Spouse able to provide mostly 24/7 supervision. Works part-time close by.  PT      OT Decreased caregiver support, Lack of/limited family support ?wife to provide 24/7 supervision  SLP      SW       Team Discharge Planning: Destination: PT-Home ,OT- Home , SLP-  Projected Follow-up: PT-24 hour supervision/assistance, Outpatient PT, OT-  Home health OT, SLP-  Projected Equipment Needs: PT-To be determined, OT- To be determined, SLP-  Equipment Details: PT- , OT-  Patient/family involved in discharge planning: PT- Patient,  OT-Patient, SLP-   MD ELOS: 8-10 days Medical Rehab Prognosis:  Excellent Assessment: The patient has been admitted  for CIR therapies with the diagnosis of pubic rami fxs . The team  will be addressing functional mobility, strength, stamina, balance, safety, adaptive techniques and equipment, self-care, bowel and bladder mgt, patient and caregiver education, pain mgt, community reentry. Goals have been set at supervision for mobility and self-care.   Due to the current state of emergency, patients may not be receiving their 3 hours per day of Medicare-mandated therapy.    Meredith Staggers, MD, FAAPMR     See Team Conference Notes for weekly updates to the plan of care

## 2021-10-25 NOTE — PMR Pre-admission (Addendum)
PMR Admission Coordinator Pre-Admission Assessment   Patient: Kevin [name] is an 75 y.o., male MRN: [id number] DOB: 03/12/1946 Height: 6' 4" (193 cm) Weight: 68.4 kg   Insurance Information HMO:     PPO:      PCP:      IPA:      80/20:      OTHER:  [facility]      Policy#: [id number]      Subscriber: self CM Name: Sherlynn Carbon     Phone#: [phone]     Fax#: [phone] Pre-Cert#:       Employer: Retired Benefits:  Phone #: [phone]     Name: Harrell Lark. Date: 04/02/2018     Deduct: $0      Out of Pocket Max: $0      Life Max: N/A CIR: 100%      SNF: 100% Outpatient: 100%     Co-Pay: none Home Health: 100%      Co-Pay: none DME: 100%     Co-Pay: none Providers: in network  SECONDARY: [place] Medicare HMO      Policy#: [id number]     Phone#: self   Financial Counselor:        Phone#:     The Data Collection Information Summary for patients in Inpatient Rehabilitation Facilities with attached Privacy Act [place] Records was provided and verbally reviewed with: Patient and Family   Emergency Contact Information Contact Information       Name Relation Home Work Doyle Spouse [phone]   [phone]    Cox,Cheryl Relative     [phone]           Current Medical History  Patient Admitting Diagnosis: Fall, R sup/inf pubic rami fx   History of Present Illness:     Pt is a 75 y.o. male admitted on 10/20/21 after a mechanical fall with hip pain. CT R hip showed superior and inferior pubis ramus fx, right inferior sacrum fracture, and two foci of active bleeding in R hemipelvis. S/p pelvic angiogram and embolization on 12/13. 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    PTA, pt lives with spouse and reports typically Independent with ADLs, IADLs and mobility without AD. 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   PT/OT evaluations completed with recommendations for inpatient rehab admission.                Patient's medical record from [facility] has been reviewed by the rehabilitation admission coordinator and physician.   Past Medical History      Past Medical History:  Diagnosis Date   Agent orange exposure      in [place]   Anemia     Heart block AV second degree      a. s/p PPM implant with subsequent CRTD upgrade   High cholesterol     History of colon polyps     LBBB (left bundle branch block)     Nonischemic cardiomyopathy ([place])      a. MDT CRTD upgrade 2016   Pacemaker 08/27/2013    Dual-chamber Medtronic Adapta implanted January 2013 for bradycardia with alternating bundle branch block and 2:1 atrioventricular block    Prostate cancer ([place])      "not been treated yet; on a wait and see" (01/22/2015)   Type II diabetes  mellitus ([place])        Has the patient had major surgery during 100 days prior to admission? Yes   Family History   family history includes Cancer in his mother; Heart disease in his father; Leukemia in his father; Ovarian cancer in his mother.   Current Medications   Current Facility-Administered Medications:    0.9 %  sodium chloride infusion, 250 mL, Intravenous, PRN, Orma Flaming, MD   acetaminophen (TYLENOL) tablet 650 mg, 650 mg, Oral, Q6H PRN, 650 mg at 10/23/21 1635 **OR** acetaminophen (TYLENOL) suppository 650 mg, 650 mg, Rectal, Q6H PRN, Orma Flaming, MD   atorvastatin (LIPITOR) tablet 40 mg, 40 mg, Oral, QPM, Orma Flaming, MD, 40 mg at 10/23/21 1635   buPROPion (WELLBUTRIN XL) 24 hr tablet 150 mg, 150 mg, Oral, QHS, Heloise Purpura, RPH, 150 mg at 10/23/21 2146   buPROPion (WELLBUTRIN XL) 24 hr tablet 300 mg, 300 mg, Oral, Daily, Heloise Purpura, RPH, 300 mg at 10/23/21 7893   Chlorhexidine Gluconate Cloth 2 % PADS 6 each, 6 each, Topical, Daily, Hosie Poisson, MD, 6 each at  10/22/21 0816   donepezil (ARICEPT) tablet 5 mg, 5 mg, Oral, Daily, Orma Flaming, MD, 5 mg at 10/23/21 0833   fluticasone (FLONASE) 50 MCG/ACT nasal spray 1 spray, 1 spray, Each Nare, Daily, Hosie Poisson, MD, 1 spray at 10/23/21 1942   HYDROcodone-acetaminophen (NORCO/VICODIN) 5-325 MG per tablet 1-2 tablet, 1-2 tablet, Oral, Q4H PRN, Orma Flaming, MD, 2 tablet at 10/23/21 1943   insulin aspart (novoLOG) injection 0-9 Units, 0-9 Units, Subcutaneous, TID WC, Orma Flaming, MD, 1 Units at 10/24/21 0645   iohexol (OMNIPAQUE) 300 MG/ML solution 100 mL, 100 mL, Intra-arterial, Once PRN, Suttle, Rosanne Ashing, MD   loratadine (CLARITIN) tablet 10 mg, 10 mg, Oral, Daily, Hosie Poisson, MD, 10 mg at 10/23/21 1635   LORazepam (ATIVAN) tablet 0.5 mg, 0.5 mg, Oral, Q8H PRN, Orma Flaming, MD, 0.5 mg at 10/23/21 2323   memantine [facility]) tablet 10 mg, 10 mg, Oral, BID, Orma Flaming, MD, 10 mg at 10/23/21 2145   ondansetron (ZOFRAN) tablet 4 mg, 4 mg, Oral, Q6H PRN **OR** ondansetron (ZOFRAN) injection 4 mg, 4 mg, Intravenous, Q6H PRN, Orma Flaming, MD, 4 mg at 10/23/21 1635   sodium chloride (OCEAN) 0.65 % nasal spray 1 spray, 1 spray, Each Nare, PRN, Hosie Poisson, MD   sodium chloride flush (NS) 0.9 % injection 3 mL, 3 mL, Intravenous, Q12H, Orma Flaming, MD, 3 mL at 10/23/21 2146   sodium chloride flush (NS) 0.9 % injection 3 mL, 3 mL, Intravenous, PRN, Orma Flaming, MD   Patients Current Diet:  Diet Order                  Diet - low sodium heart healthy             Diet Carb Modified Fluid consistency: Thin; Room service appropriate? Yes with Assist  Diet effective now                         Precautions / Restrictions Precautions Precautions: Fall Restrictions Weight Bearing Restrictions: No RLE Weight Bearing: Weight bearing as tolerated    Has the patient had 2 or more falls or a fall with injury in the past year? Yes   Prior Activity Level Community (5-7x/wk): Went out  daily, was driving.   Prior Functional Level Self Care: Did the patient need help bathing, dressing, using the toilet or eating?  Independent   Indoor Mobility: Did the patient need assistance with walking from room to room (with or without device)? Independent   Stairs: Did the patient need assistance with internal or external stairs (with or without device)? Independent   Functional Cognition: Did the patient need help planning regular tasks such as shopping or remembering to take medications? Independent   Patient Information Are you of Hispanic, Latino/a,or Spanish origin?: A. No, not of Hispanic, Latino/a, or Spanish origin What is your race?: A. White Do you need or want an interpreter to communicate with a doctor or health care staff?: 0. No   Patient's Response To:  Health Literacy and Transportation Is the patient able to respond to health literacy and transportation needs?: Yes Health Literacy - How often do you need to have someone help you when you read instructions, pamphlets, or other written material from your doctor or pharmacy?: Rarely In the past 12 months, has lack of transportation kept you from medical appointments or from getting medications?: No In the past 12 months, has lack of transportation kept you from meetings, work, or from getting things needed for daily living?: No   Development worker, international aid / [place] Devices/Equipment: Blood pressure cuff, Walker (specify type) Home Equipment: Other (comment) (wife says they may have RW but unsure)   Prior Device Use: Indicate devices/aids used by the patient prior to current illness, exacerbation or injury? None of the above   Current Functional Level Cognition   Overall Cognitive Status: Impaired/Different from baseline Current Attention Level: Sustained Orientation Level: Oriented to person, Oriented to place, Oriented to time, Disoriented to situation Following Commands: Follows one step commands  with increased time, Follows one step commands consistently Safety/Judgement: Decreased awareness of safety, Decreased awareness of deficits General Comments: Pt requiring cues for initiation, transfer techniques, safety.  Pt occasionally getting words mixed up and making confused statements    Extremity Assessment (includes Sensation/Coordination)   Upper Extremity Assessment: Generalized weakness  Lower Extremity Assessment: Defer to PT evaluation     ADLs   Overall ADL's : Needs assistance/impaired Eating/Feeding: Set up, Sitting Grooming: Supervision/safety, Sitting Upper Body Bathing: Minimal assistance, Sitting Lower Body Bathing: Maximal assistance, Sit to/from stand Upper Body Dressing : Minimal assistance, Sitting Lower Body Dressing: Maximal assistance, Sit to/from stand Lower Body Dressing Details (indicate cue type and reason): able to demo bending to reach top of socks as pt reports this is normally how he gets dressed. will need increased assist in standing to maintain balance/don over waist, as well as sequencing cues Toilet Transfer: Moderate assistance, Stand-pivot, Rolling walker (2 wheels) Toileting- Clothing Manipulation and Hygiene: Maximal assistance, Sit to/from stand General ADL Comments: Pt limited by cognition (frequent cues/redirection needed), as well as pain/impaired balance during activity     Mobility   Overal bed mobility: Needs Assistance Bed Mobility: Supine to Sit, Sit to Supine Supine to sit: HOB elevated, Mod assist Sit to supine: Mod assist, HOB elevated General bed mobility comments: Requiring cues for sequencing ; educated on keeping legs close for decreased pain; min A for legs to EOB and to mod A to lift trunk; For back to bed mod A for bil legs     Transfers   Overall transfer level: Needs assistance Equipment used: Rolling walker (2 wheels) Transfers: Sit to/from Stand Sit to Stand: Mod assist, From elevated surface Bed to/from  chair/wheelchair/BSC transfer type:: Step pivot Step pivot transfers: Mod assist General transfer comment: Sit to stand x 1 with cues  for hand placement , assist to initiate, mod A to rise and steady     Ambulation / Gait / Stairs / Wheelchair Mobility   Ambulation/Gait Ambulation/Gait assistance: Herbalist (Feet): 26 Feet Assistive device: Rolling walker (2 wheels) Gait Pattern/deviations: Step-to pattern, Decreased stride length, Shuffle, Trunk flexed General Gait Details: Cues for posture and RW use; pt ambulated to door and back but limited due to nausea Gait velocity: decreased Pre-gait activities: Several marches in place, mod A for stability. Required a seated rest break prior to ambulating to chair.     Posture / Balance Balance Overall balance assessment: Needs assistance Sitting-balance support: Feet supported, No upper extremity supported Sitting balance-Leahy Scale: Fair Standing balance support: Bilateral upper extremity supported, Reliant on assistive device for balance Standing balance-Leahy Scale: Poor Standing balance comment: Reliant on BUE to offload painful RLE in static standing and mobility, physical assist needed for stability     Special needs/care consideration Continuous Drip IV  KVO, Skin L femoral gauze in place, and Diabetic management Yes, h/o DM, on insulin coverage in acute hospital.    Previous Home Environment (from acute therapy documentation) Living Arrangements: Spouse/significant other  Lives With: Spouse Available Help at Discharge: Family Type of Home: House Home Layout: One level Home Access: Stairs to enter Entrance Stairs-Rails: None Technical brewer of Steps: 2 Bathroom Shower/Tub: Gaffer Home Care Services: No Additional Comments: Wife works part time across the street   Discharge Living Setting Plans for Discharge Living Setting: Patient's home, House, Lives with (comment) (Lives with wife) Type of Home  at Discharge: House Discharge Home Layout: Two level, Bed/bath upstairs, Able to live on main level with bedroom/bathroom Alternate Level Stairs-Number of Steps: Flight Discharge Home Access: Stairs to enter Entrance Stairs-Rails: Right, Left, Can reach both Entrance Stairs-Number of Steps: 4 steps front entry Discharge Bathroom Shower/Tub: Walk-in shower, Door Discharge Bathroom Toilet: Standard Discharge Bathroom Accessibility: Yes How Accessible: Accessible via walker Does the patient have any problems obtaining your medications?: No   Social/Family/Support Systems Patient Roles: Spouse, Parent (Has a wife and 2 grown children) Contact Information: [name] Umbach - wife Anticipated Caregiver: wife Ability/Limitations of Caregiver: , works PT close by Careers adviser: Other (Comment) (Mostly 24/7 supervision, does work Warehouse manager) Discharge Plan Discussed with Primary Caregiver: Yes Is Caregiver In Agreement with Plan?: Yes Does Caregiver/Family have Issues with Lodging/Transportation while Pt is in Rehab?: No   Goals Patient/Family Goal for Rehab: PT/OT mod I and supervision Expected length of stay: 7-10 days Pt/Family Agrees to Admission and willing to participate: Yes Program Orientation Provided & Reviewed with Pt/Caregiver Including Roles  & Responsibilities: Yes   Decrease burden of Care through IP rehab admission: N/A   Possible need for SNF placement upon discharge: Not anticipated   Patient Condition: I have reviewed medical records from [place], spoken with CM, and patient and spouse. I met with patient at the bedside for inpatient rehabilitation assessment.  Patient will benefit from ongoing PT and OT, can actively participate in 3 hours of therapy a day 5 days of the week, and can make measurable gains during the admission.  Patient will also benefit from the coordinated team approach during an Inpatient Acute Rehabilitation admission.  The patient will receive  intensive therapy as well as Rehabilitation physician, nursing, social worker, and care management interventions.  Due to bladder management, bowel management, safety, skin/wound care, disease management, medication administration, pain management, and patient education the patient requires 24 hour a day  rehabilitation nursing.  The patient is currently Min-Mod A with mobility and Min-Max A with basic ADLs.  Discharge setting and therapy post discharge at home with home health is anticipated.  Patient has agreed to participate in the Acute Inpatient Rehabilitation Program and will admit today. Preadmission Screen Completed By:  Retta Diones with updates by Gretchen Short, 10/24/2021 9:22 AM  ______________________________________________________________________   Discussed status with Dr. Letta Pate on 10/24/21  at 9:32 AM  and received approval for admission today.  Admission Coordinator:  Retta Diones, RN time 9:32 AM/Date 10/24/21    Assessment/Plan: Diagnosis:Pelvic fracture  Does the need for close, 24 hr/day Medical supervision in concert with the patient's rehab needs make it unreasonable for this patient to be served in a less intensive setting? Yes Co-Morbidities requiring supervision/potential complications: cardiomyopathy , HTN Due to bladder management, bowel management, safety, skin/wound care, disease management, medication administration, pain management, and patient education, does the patient require 24 hr/day rehab nursing? Yes Does the patient require coordinated care of a physician, rehab nurse, PT, OT, and SLP to address physical and functional deficits in the context of the above medical diagnosis(es)? Yes Addressing deficits in the following areas: balance, endurance, locomotion, strength, transferring, bowel/bladder control, bathing, dressing, feeding, grooming, toileting, and psychosocial support Can the patient actively participate in an intensive therapy program  of at least 3 hrs of therapy 5 days a week? Yes The potential for patient to make measurable gains while on inpatient rehab is good Anticipated functional outcomes upon discharge from inpatient rehab: supervision PT, supervision OT, n/a SLP Estimated rehab length of stay to reach the above functional goals is: 7-10d Anticipated discharge destination: Home 10. Overall Rehab/Functional Prognosis: good   MD Signature: Charlett Blake M.D. [place] [facility] [facility] Fellow Am Acad of Phys Med and Rehab Diplomate Am Board of Electrodiagnostic Med Fellow Am Board of Interventional Pain

## 2021-10-25 NOTE — Progress Notes (Addendum)
PROGRESS NOTE   Subjective/Complaints: Patient did not recall PT and OT evaluations today No issues overnight, felt dizzy this morning could not tell whether it was when he went from sit to stand.  He states that his fall at home was when he was getting the newspaper at 2:30 AM and he bumped into a planter and fell. Review of systems negative chest pain shortness of breath nausea vomiting diarrhea constipation  Objective:   No results found. Recent Labs    10/24/21 0053  WBC 7.5  HGB 11.4*  HCT 34.3*  PLT 131*   Recent Labs    10/24/21 0053  NA 135  K 4.2  CL 106  CO2 23  GLUCOSE 147*  BUN 13  CREATININE 1.13  CALCIUM 8.7*    Intake/Output Summary (Last 24 hours) at 10/25/2021 1015 Last data filed at 10/25/2021 0748 Gross per 24 hour  Intake 240 ml  Output 675 ml  Net -435 ml        Physical Exam: Vital Signs Blood pressure 134/74, pulse 84, temperature 98.1 F (36.7 C), resp. rate 18, weight 73.1 kg, SpO2 95 %.   General: No acute distress Mood and affect are appropriate Heart: Regular rate and rhythm no rubs murmurs or extra sounds Lungs: Clear to auscultation, breathing unlabored, no rales or wheezes Abdomen: Positive bowel sounds, soft nontender to palpation, nondistended Extremities: No clubbing, cyanosis, or edema Skin: No evidence of breakdown, no evidence of rash Neurologic: Cranial nerves II through XII intact, motor strength is 5/5 in bilateral deltoid, bicep, tricep, grip, 3- RIght , 5/5 Left hip flexor, knee extensors, ankle dorsiflexor and plantar flexor Sensory exam normal sensation to light touch and proprioception in bilateral upper and lower extremities Cerebellar exam normal finger to nose to finger as well as heel to shin in bilateral upper and lower extremities Musculoskeletal: Full range of motion in all 4 extremities. No joint swelling   Assessment/Plan: 1. Functional deficits  which require 3+ hours per day of interdisciplinary therapy in a comprehensive inpatient rehab setting. Physiatrist is providing close team supervision and 24 hour management of active medical problems listed below. Physiatrist and rehab team continue to assess barriers to discharge/monitor patient progress toward functional and medical goals  Care Tool:  Bathing              Bathing assist       Upper Body Dressing/Undressing Upper body dressing   What is the patient wearing?: Hospital gown only    Upper body assist Assist Level: Minimal Assistance - Patient > 75%    Lower Body Dressing/Undressing Lower body dressing    Lower body dressing activity did not occur: N/A       Lower body assist       Toileting Toileting    Toileting assist Assist for toileting: Moderate Assistance - Patient 50 - 74%     Transfers Chair/bed transfer  Transfers assist           Locomotion Ambulation   Ambulation assist              Walk 10 feet activity   Assist  Walk 50 feet activity   Assist           Walk 150 feet activity   Assist           Walk 10 feet on uneven surface  activity   Assist           Wheelchair     Assist               Wheelchair 50 feet with 2 turns activity    Assist            Wheelchair 150 feet activity     Assist          Blood pressure 134/74, pulse 84, temperature 98.1 F (36.7 C), resp. rate 18, weight 73.1 kg, SpO2 95 %.  Medical Problem List and Plan: 1. Functional deficits secondary to fall sustaining right superior inferior pubic ramus fracture/pelvic hematoma.  Status postembolization of right pectineal branch artery.  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  Weightbearing as tolerated.             -patient may  shower             -ELOS/Goals: 7-10d , sup/mod I 2.  Antithrombotics: -DVT/anticoagulation:  Mechanical:  Antiembolism stockings, thigh (TED hose) Bilateral lower extremities             -antiplatelet therapy: Resume Plavix 75 mg daily 10/30/2021 3. Pain Management: Hydrocodone as needed, took 2 this morning prior to therapy. May need to start weaning depending on level of pain as well as confusion 4. Mood: Aricept 5 mg daily, Wellbutrin 150 mg daily, 300 mg daily, Namenda 10 mg twice daily, Ativan every 8 hours as needed             -antipsychotic agents: N/A 5. Neuropsych: This patient is capable of making decisions on his own behalf.  Mild dementia, forgetful but can make decisions with assistance 6. Skin/Wound Care: Routine skin checks 7. Fluids/Electrolytes/Nutrition: Routine in and outs with follow-up chemistries 8.  Acute blood loss anemia.  Follow-up CBC on 12/19 9.  Hyperlipidemia.  Lipitor 10.  Chronic combined systolic diastolic congestive heart failure.  Monitor for any signs of fluid overload 11.  History of left bundle branch block.  Status post pacemaker 2013.  Follow-up outpatient 12.  Type 2 diabetes mellitus.  Hemoglobin A1c 6.7.  SSI.  Patient on Synjardy XR 2 tabs daily prior to admission.   CBG (last 3)  Recent Labs    10/24/21 2109 10/25/21 0626 10/25/21 1155  GLUCAP 172* 122* 131*  Controlled 10/25/21   13.  Urinary retention/BPH.  Urinary tension possibly from pelvic hematoma.  Foley catheter tube removed.  Voiding trial.    LOS: 1 days A FACE TO FACE EVALUATION WAS PERFORMED  Charlett Blake 10/25/2021, 10:15 AM

## 2021-10-25 NOTE — Progress Notes (Signed)
Inpatient Rehabilitation Admission Medication Review by a Pharmacist  A complete drug regimen review was completed for this patient to identify any potential clinically significant medication issues.  High Risk Drug Classes Is patient taking? Indication by Medication  Antipsychotic No   Anticoagulant No   Antibiotic No   Opioid Yes Hydrocodone-APAP prn, Tylenol prn - Pain  Antiplatelet Yes Clopidogrel (starting 12/23) -  Stroke ppx   Hypoglycemics/insulin Yes Novolog SSI - Type 2 Diabetes  Vasoactive Medication No   Chemotherapy No   Other Yes Bupropion, Donepezil, Memantine - Dementia/Mood Lorazepam prn - Anxiety Atorvastatin - Hyperlipidemia     Type of Medication Issue Identified Description of Issue Recommendation(s)  Drug Interaction(s) (clinically significant)     Duplicate Therapy  Hydrocodone-APAP prn was discontinued during acute stay. Per discharge summary, patient should be taking APAP 500 mg q6h prn moderate pain or headache. However, in inpatient rehab, patient is receiving APAP 650 mg q6h prn mild pain and Hydrocodone-APAP prn moderate pain. Reached out to MD to clarify.   Allergy     No Medication Administration End Date     Incorrect Dose     Additional Drug Therapy Needed  The following medications were noted in the discharge summary, but have not been resumed in inpatient rehab:  Fluticasone, Loratadine - Allergies Promethazine prn - Nausea/Vomiting  Synjardy XR - Type 2 Diabetes Reached out to MD to clarify.   Significant med changes from prior encounter (inform family/care partners about these prior to discharge).    Other       Clinically significant medication issues were identified that warrant physician communication and completion of prescribed/recommended actions by midnight of the next day:  Yes  Name of provider notified for urgent issues identified: Dr. Alysia Penna  Provider Method of Notification: Secure chat    Pharmacist comments:  Reached out to Dr. Letta Pate via Secure chat to clarify, awaiting response.   Time spent performing this drug regimen review (minutes):  [place] 10/25/2021 2:05 PM

## 2021-10-25 NOTE — Plan of Care (Signed)
°  Problem: RH Balance Goal: LTG Patient will maintain dynamic standing with ADLs (OT) Description: LTG:  Patient will maintain dynamic standing balance with assist during activities of daily living (OT)  Flowsheets (Taken 10/25/2021 1618) LTG: Pt will maintain dynamic standing balance during ADLs with: Supervision/Verbal cueing   Problem: Sit to Stand Goal: LTG:  Patient will perform sit to stand in prep for activites of daily living with assistance level (OT) Description: LTG:  Patient will perform sit to stand in prep for activites of daily living with assistance level (OT) Flowsheets (Taken 10/25/2021 1618) LTG: PT will perform sit to stand in prep for activites of daily living with assistance level: Supervision/Verbal cueing   Problem: RH Bathing Goal: LTG Patient will bathe all body parts with assist levels (OT) Description: LTG: Patient will bathe all body parts with assist levels (OT) Flowsheets (Taken 10/25/2021 1618) LTG: Pt will perform bathing with assistance level/cueing: Supervision/Verbal cueing   Problem: RH Dressing Goal: LTG Patient will perform upper body dressing (OT) Description: LTG Patient will perform upper body dressing with assist, with/without cues (OT). Flowsheets (Taken 10/25/2021 1618) LTG: Pt will perform upper body dressing with assistance level of: Set up assist Goal: LTG Patient will perform lower body dressing w/assist (OT) Description: LTG: Patient will perform lower body dressing with assist, with/without cues in positioning using equipment (OT) Flowsheets (Taken 10/25/2021 1618) LTG: Pt will perform lower body dressing with assistance level of: Supervision/Verbal cueing   Problem: RH Toileting Goal: LTG Patient will perform toileting task (3/3 steps) with assistance level (OT) Description: LTG: Patient will perform toileting task (3/3 steps) with assistance level (OT)  Flowsheets (Taken 10/25/2021 1618) LTG: Pt will perform toileting task (3/3  steps) with assistance level: Supervision/Verbal cueing   Problem: RH Toilet Transfers Goal: LTG Patient will perform toilet transfers w/assist (OT) Description: LTG: Patient will perform toilet transfers with assist, with/without cues using equipment (OT) Flowsheets (Taken 10/25/2021 1618) LTG: Pt will perform toilet transfers with assistance level of: Supervision/Verbal cueing   Problem: RH Tub/Shower Transfers Goal: LTG Patient will perform tub/shower transfers w/assist (OT) Description: LTG: Patient will perform tub/shower transfers with assist, with/without cues using equipment (OT) Flowsheets (Taken 10/25/2021 1618) LTG: Pt will perform tub/shower stall transfers with assistance level of: Supervision/Verbal cueing

## 2021-10-25 NOTE — Progress Notes (Signed)
VASCULAR LAB    Bilateral lower extremity venous duplex has been performed.  See CV proc for preliminary results.   Kerrie Timm, RVT 10/25/2021, 12:30 PM

## 2021-10-25 NOTE — Discharge Instructions (Addendum)
Inpatient Rehab Discharge Instructions  Kevin [name] Discharge date and time: No discharge date for patient encounter.   Activities/Precautions/ Functional Status: Activity: As tolerated Diet: Carb modified Wound Care: Routine skin checks Functional status:  ___ No restrictions     ___ Walk up steps independently ___ 24/7 supervision/assistance   ___ Walk up steps with assistance ___ Intermittent supervision/assistance  ___ Bathe/dress independently ___ Walk with walker     _x__ Bathe/dress with assistance ___ Walk Independently    ___ Shower independently ___ Walk with assistance    ___ Shower with assistance ___ No alcohol     ___ Return to work/school ________  Special Instructions: No driving smoking or alcohol    COMMUNITY REFERRALS UPON DISCHARGE:    Home Health:   PT  OT  RN                Agency: [facility]   Phone: [phone]  Medical Equipment/Items Ordered:TALL ROLLING WALKER, 3 IN 1 & TUB SEAT                                                 Agency[facility]Supplier:[facility] ORDER[facility] FOR PATIENT AND SHIPPING TO HOME   My questions have been answered and I understand these instructions. I will adhere to these goals and the provided educational materials after my discharge from the hospital.  Patient/Caregiver Signature _______________________________ Date __________  Clinician Signature _______________________________________ Date __________  Please bring this form and your medication list with you to all your follow-up doctor's appointments.

## 2021-10-25 NOTE — Evaluation (Signed)
Physical Therapy Assessment and Plan  Patient Details  Name: Kevin [name] MRN: [id number] Date of Birth: 1946-05-02  PT Diagnosis: Difficulty walking, Dizziness and giddiness, Impaired cognition, Muscle weakness, and Pain in joint Rehab Potential: Good ELOS: 10-12 days   Today's Date: 10/25/2021 PT Individual Time: 1000-1035 PT Individual Time Calculation (min): 35 min    Hospital Problem: Principal Problem:   Pubic ramus fracture ([place]) Active Problems:   Fracture of multiple pubic rami with routine healing, right   Past Medical History:  Past Medical History:  Diagnosis Date   Agent orange exposure    in [place]   Anemia    Heart block AV second degree    a. s/p PPM implant with subsequent CRTD upgrade   High cholesterol    History of colon polyps    LBBB (left bundle branch block)    Nonischemic cardiomyopathy ([place])    a. MDT CRTD upgrade 2016   Pacemaker 08/27/2013   Dual-chamber Medtronic Adapta implanted January 2013 for bradycardia with alternating bundle branch block and 2:1 atrioventricular block    Prostate cancer ([place])    "not been treated yet; on a wait and see" (01/22/2015)   Type II diabetes mellitus ([place])    Past Surgical History:  Past Surgical History:  Procedure Laterality Date   BASAL CELL CARCINOMA EXCISION     "back, face, neck"   BI-VENTRICULAR IMPLANTABLE CARDIOVERTER DEFIBRILLATOR UPGRADE N/A 01/22/2015   MDT CRTD upgrade by Dr Rayann Heman   BIV ICD GENERATOR CHANGEOUT N/A 08/28/2020   Procedure: BIV ICD GENERATOR CHANGEOUT;  Surgeon: [name], MD;  Location: [place] [facility];  Service: Cardiovascular;  Laterality: N/A;   CARDIAC CATHETERIZATION  01/27/2012   normal coronaries, dilated LV c/w nonischemic cardiomyopathy   EXAMINATION UNDER ANESTHESIA  04/06/2012   Procedure: EXAM UNDER ANESTHESIA;  Surgeon: Marcello Moores A. Cornett, MD;  Location: [place];  Service: General;  Laterality: N/A;   INCISION AND DRAINAGE PERIRECTAL ABSCESS  ~ 2010; 2015    INGUINAL HERNIA REPAIR Left 1980   IR ANGIOGRAM PELVIS SELECTIVE OR SUPRASELECTIVE  10/20/2021   IR ANGIOGRAM VISCERAL SELECTIVE  10/20/2021   IR EMBO ART  VEN HEMORR LYMPH EXTRAV  INC GUIDE ROADMAPPING  10/20/2021   IR US GUIDE VASC ACCESS LEFT  10/20/2021   LEFT HEART CATHETERIZATION WITH CORONARY ANGIOGRAM N/A 01/27/2012   Procedure: LEFT HEART CATHETERIZATION WITH CORONARY ANGIOGRAM;  Surgeon: Troy Sine, MD;  Location[facility];  Service: Cardiovascular;  Laterality: N/A;   NM MYOCAR PERF WALL MOTION  01/21/2012   mod-severe perfusion defect due to infarct/scar w/mild perinfarct ischemia in the apical,basal inferoseptal,basal inferior,mid inferoseptal,midinferior and apical inferior regions   pacemaker battery     PERMANENT PACEMAKER INSERTION N/A 12/01/2011   Medtronic implanted by Dr Sallyanne Kuster   PROSTATE BIOPSY     PROSTATE BIOPSY     shrapnel surgery     "in [place]"   TONSILLECTOMY      Assessment & Plan Clinical Impression: Patient is a right-handed male with history of left bundle branch heart block status post pacemaker 2013 maintained on Plavix/nonischemic cardiomyopathy/chronic combined systolic diastolic congestive heart failure, hyperlipidemia, type 2 diabetes mellitus, prostate cancer, depression/memory loss maintained on Aricept/Namenda quit smoking 37 years ago.  Per chart review patient lives with spouse.  1 level home 2 steps to entry.  Modified independent and driving prior to admission.  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  Wife works part-time.  Presented 10/20/2021 after  mechanical fall without loss of conscious.  He denied any chest pain or shortness of breath associated with the fall.  Cranial CT scan and cervical spine films negative.  CT of the right hip as well as CT angiography of abdomen and pelvis showed mildly displaced right superior and inferior pubic ramus fracture.  There was also a large hematoma within the right hemipelvis with  adjacent fluid stranding measuring 10.9 x 5.8 x 8.3 cm.  As well as 2 foci of active bleeding in the right hemipelvis largest focus of bleeding along the right anterior aspect of the prostate.  Small focus of bleeding near the right superior pubic ramus.  There were areas of bleeding appeared to be supplied by a corona mortise branch that originates from the distal right external iliac artery.  Incidental findings of nodules left lung base largest measuring 3 mm no follow-up was needed patient considered low risk as no known or suspected primary neoplasm.  Patient underwent coil embolization of right pectineal branch artery per interventional radiology.  Gelfoam embolization of the replaced right obturator/corona mortise artery.  Orthopedic follow-up in regards to pubic ramus fracture weightbearing as tolerated.  Hemoglobin remained stable at 10.4.  Patient with initial urinary retention possibly from pelvic hematoma Foley catheter tube removed for voiding trial and monitored.  Renal ultrasound unremarkable.  Therapy evaluations completed due to patient decreased functional mobility was admitted for a comprehensive rehab program. Patient transferred to CIR on 10/24/2021 .   Patient currently requires mod with mobility secondary to muscle weakness, decreased cardiorespiratoy endurance, decreased safety awareness and decreased memory, dizziness, and decreased standing balance and decreased balance strategies.  Prior to hospitalization, patient was independent  with mobility and lived with Spouse in a House home.  Home access is 2Stairs to enter.  Patient will benefit from skilled PT intervention to maximize safe functional mobility, minimize fall risk, and decrease caregiver burden for planned discharge home with 24 hour supervision.  Anticipate patient will benefit from follow up OP at discharge.  PT - End of Session Activity Tolerance: Tolerates 10 - 20 min activity with multiple rests;Decreased this session  (limited by dizziness and nausea) Endurance Deficit: Yes PT Assessment Rehab Potential (ACUTE/IP ONLY): Good PT Patient demonstrates impairments in the following area(s): Balance;Endurance;Motor;Pain;Safety PT Transfers Functional Problem(s): Bed Mobility;Bed to Chair;Car;Furniture PT Locomotion Functional Problem(s): Ambulation;Wheelchair Mobility;Stairs PT Plan PT Intensity: Minimum of 1-2 x/day ,45 to 90 minutes PT Frequency: 5 out of 7 days PT Duration Estimated Length of Stay: 10-12 days PT Treatment/Interventions: Ambulation/gait training;Balance/vestibular training;Cognitive remediation/compensation;Community reintegration;Discharge planning;Disease management/prevention;DME/adaptive equipment instruction;Functional mobility training;Neuromuscular re-education;Pain management;Patient/family education;Psychosocial support;Skin care/wound management;Splinting/orthotics;Stair training;Therapeutic Activities;UE/LE Strength taining/ROM;Therapeutic Exercise;UE/LE Coordination activities;Wheelchair propulsion/positioning PT Transfers Anticipated Outcome(s): supervision overall PT Locomotion Anticipated Outcome(s): supervision household distances for gait with LRAD; min assist stairs PT Recommendation Recommendations for Other Services: Speech consult;Vestibular eval Follow Up Recommendations: 24 hour supervision/assistance;Outpatient PT Patient destination: Home Equipment Recommended: To be determined   PT Evaluation Precautions/Restrictions Precautions Precautions: Fall Precaution Comments: pacemaker Restrictions Weight Bearing Restrictions: No RLE Weight Bearing: Weight bearing as tolerated General PT Amount of Missed Time (min): 25 Minutes PT Missed Treatment Reason: Patient ill (Comment) (pt dizzy and nauseous and request to try more later)  Pain Assessment Pain Score: 2  Pain Interference Pain Interference Pain Effect on Sleep: 0. Does not apply - I have not had any pain or  hurting in the past 5 days Pain Interference with Therapy Activities: 3. Frequently Pain Interference with Day-to-Day Activities: 3. 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 Vision/Perception  Vision - History Baseline Vision: Wears glasses only for reading Perception Perception: Within Functional Limits Praxis Praxis: Intact  Cognition Overall Cognitive Status: Impaired/Different from baseline Arousal/Alertness: Lethargic Orientation Level: Oriented X4 Safety/Judgment: Impaired Sensation Sensation Light Touch: Appears Intact Proprioception: Appears Intact Coordination Gross Motor Movements are Fluid and Coordinated: Yes (RLE inhibited by pain) Motor  Motor Motor: Other (comment) Motor - Skilled Clinical Observations: generalized weakness (RLE > LLE)   Trunk/Postural Assessment  Cervical Assessment Cervical Assessment: Exceptions to [facility] (forward head/rounded shoulders) Thoracic Assessment Thoracic Assessment: Exceptions to [facility] (slight kyphosis) Lumbar Assessment Lumbar Assessment: Exceptions to [facility] (posterior tilt, decreased WB on R) Postural Control Postural Control: Deficits on evaluation (decreased balance reactions in standing)  Balance Balance Balance Assessed: Yes Dynamic Sitting Balance Dynamic Sitting - Balance Support: During functional activity Dynamic Sitting - Level of Assistance:  5: Stand by assistance Dynamic Standing Balance Dynamic Standing - Balance Support: During functional activity Dynamic Standing - Level of Assistance: 4: Min assist Extremity Assessment      RLE Assessment RLE Assessment: Exceptions to [facility] Strength Comments: limited by pain but able to perform grossly 3/5 for hip flexion, 3+/5 knee extension and ankle DF LLE Assessment LLE Assessment: Within Functional Limits  Care Tool Care Tool Bed Mobility Roll left and right activity   Roll left and right assist level: Contact Guard/Touching assist    Sit to lying activity   Sit to lying assist level: Minimal Assistance - Patient > 75%    Lying to sitting on side of bed activity   Lying to sitting on side of bed assist level: the ability to move from lying on the back to sitting on the side of the bed with no back support.: Minimal Assistance - Patient > 75%     Care Tool Transfers Sit to stand transfer   Sit to stand assist level: Moderate Assistance - Patient 50 - 74%    Chair/bed transfer   Chair/bed transfer assist level: Moderate Assistance - Patient 50 - 74%     Toilet transfer   Assist Level: Minimal Assistance - Patient > 75%    Car transfer Car transfer activity did not occur: Refused (due to dizziness/nausea)        Care Tool Locomotion Ambulation   Assist level: Minimal Assistance - Patient > 75%      Walk 10 feet activity   Assist level: Minimal Assistance - Patient > 75%     Walk 50 feet with 2 turns activity Walk 50 feet with 2 turns activity did not occur: Safety/medical concerns      Walk 150 feet activity Walk 150 feet activity did not occur: Safety/medical concerns      Walk 10 feet on uneven surfaces activity Walk 10 feet on uneven surfaces activity did not occur: Safety/medical concerns      Stairs Stair activity did not occur: Refused (nausea//dizziness)        Walk up/down 1 step activity Walk up/down 1 step or  curb (drop down) activity  did not occur: Refused      Walk up/down 4 steps activity Walk up/down 4 steps activity did not occur: Safety/medical concerns      Walk up/down 12 steps activity Walk up/down 12 steps activity did not occur: Safety/medical concerns      Pick up small objects from floor Pick up small object from the floor (from standing position) activity did not occur: Safety/medical concerns      Wheelchair Is the patient using a wheelchair?: Yes Type of Wheelchair: Manual        Wheel 50 feet with 2 turns activity  Dependent (energy conservation)    Wheel 150 feet activity  Dependent (energy conservation)      Refer to Care Plan for Long Term Goals  SHORT TERM GOAL WEEK 1 PT Short Term Goal 1 (Week 1): Pt will perform bed mobility with supervision PT Short Term Goal 2 (Week 1): Pt will perform transfers with CGA PT Short Term Goal 3 (Week 1): Pt will gait x 50' with CGA PT Short Term Goal 4 (Week 1): Pt will perform stair negotiation with min assist  Recommendations for other services: Other: ST eval if cognition not clearing and potential for vestibular evaluation to further test pt's report of dizziness  Skilled Therapeutic Intervention Mobility Bed Mobility Bed Mobility: Rolling Right;Supine to Sit;Sit to Supine Rolling Right: Contact Guard/Touching assist Supine to Sit: Minimal Assistance - Patient > 75% Sit to Supine: Minimal Assistance - Patient > 75% Transfers Transfers: Sit to Stand;Stand to Sit;Stand Pivot Transfers Sit to Stand: Moderate Assistance - Patient 50-74% Stand to Sit: Minimal Assistance - Patient > 75% Stand Pivot Transfers: Minimal Assistance - Patient > 75% Stand Pivot Transfer Details: Verbal cues for technique;Verbal cues for safe use of DME/AE;Tactile cues for posture;Tactile cues for initiation Transfer (Assistive device): Rolling walker Locomotion  Gait Gait Distance (Feet): 25 Feet Assistive device: Rolling walker Gait Gait Pattern: Impaired Stairs  / Additional Locomotion Stairs: No   Pt very limited during evaluation due to reports of increased dizziness and nausea (states he took some pain medication earlier). Pt unable to tolerate EOB x 3 min before stating he needed to lay back down. Before PT came into room he said he tried to stand up and almost fell from being so dizzy. Vitals were taken and pt with slight drop in BP but pt also reports dizziness can feel like spinning. Discussed any h/o vestibular issues and pt reports he has had issues with it for months due to something in "his brain". No family present to clarify information. No nystagmus noted with sit > supine, but pt declined further mobility this AM and request to try again this afternoon. Per OT, did not have issues this morning that limited upright mobility though he did complain of dizziness then as well. Brought w/c with basic cushion and back as well as elevating legrest (due to height) for trial of OOB next session if possible to tolerate. Offered recliner due to reclining features, but pt prefers to attempt again later this PM.     Discharge Criteria: Patient will be discharged from PT if patient refuses treatment 3 consecutive times without medical reason, if treatment goals not met, if there is a change in medical status, if patient makes no progress towards goals or if patient is discharged from hospital.  The above assessment, treatment plan, treatment alternatives and goals were discussed and mutually agreed upon: by patient     Session #  2: 1300-1330 (30 min) Individual therapy; Pt presents with wife now in room also. Reviewed what we covered this morning and limited evaluation. Wife reports he is more lethargic today than he has been. She reports his mobility looks improved. She states the dizziness has been ongoing but this is worse today as it generally doesn't limit him. Pt unaware that he had already deferred this session from this morning as he asked again to  try to do it later. With encouragement by myself and his wife, he was agreable to try. Throughout seession states he feels nausea but did not vomit. Performed sit < stands with min to mod assist with cues for technique and hand placement for improved body mechanics. During gait trials up to 25', pt requires min assist with RW and noted decreased stance time on RLE and short step length noted. Cues for upright posture. Pt declined attempting stairs or further mobility due to nausea/dizziness. Wife also discreetly reported he has Alzheimer's when asking about current cognitive state and decrased memory, which she reports is at baseline. End of session returned to room and set up in recliner with pt able to naviagte through obstacles sidestepping to get to recliner with min assist x 10' and cues for safety.    Canary Brim Ivory Broad, PT, DPT, CBIS  10/25/2021, 1:46 PM

## 2021-10-26 DIAGNOSIS — E119 Type 2 diabetes mellitus without complications: Secondary | ICD-10-CM

## 2021-10-26 DIAGNOSIS — S32591S Other specified fracture of right pubis, sequela: Secondary | ICD-10-CM

## 2021-10-26 DIAGNOSIS — R339 Retention of urine, unspecified: Secondary | ICD-10-CM

## 2021-10-26 DIAGNOSIS — I5042 Chronic combined systolic (congestive) and diastolic (congestive) heart failure: Secondary | ICD-10-CM

## 2021-10-26 LAB — COMPREHENSIVE METABOLIC PANEL
ALT: 9 U/L (ref 0–44)
AST: 17 U/L (ref 15–41)
Albumin: 3.1 g/dL — ABNORMAL LOW (ref 3.5–5.0)
Alkaline Phosphatase: 70 U/L (ref 38–126)
Anion gap: 8 (ref 5–15)
BUN: 20 mg/dL (ref 8–23)
CO2: 27 mmol/L (ref 22–32)
Calcium: 9 mg/dL (ref 8.9–10.3)
Chloride: 101 mmol/L (ref 98–111)
Creatinine, Ser: 1.09 mg/dL (ref 0.61–1.24)
GFR, Estimated: >60 mL/min (ref >60)
Glucose, Bld: 131 mg/dL — ABNORMAL HIGH (ref 70–99)
Potassium: 4.3 mmol/L (ref 3.5–5.1)
Sodium: 136 mmol/L (ref 135–145)
Total Bilirubin: 1.8 mg/dL — ABNORMAL HIGH (ref 0.3–1.2)
Total Protein: 6 g/dL — ABNORMAL LOW (ref 6.5–8.1)

## 2021-10-26 LAB — CBC WITH DIFFERENTIAL/PLATELET
Abs Immature Granulocytes: 0.03 10*3/uL (ref 0.00–0.07)
Basophils Absolute: 0 10*3/uL (ref 0.0–0.1)
Basophils Relative: 0 %
Eosinophils Absolute: 0.1 10*3/uL (ref 0.0–0.5)
Eosinophils Relative: 1 %
HCT: 36 % — ABNORMAL LOW (ref 39.0–52.0)
Hemoglobin: 11.7 g/dL — ABNORMAL LOW (ref 13.0–17.0)
Immature Granulocytes: 0 %
Lymphocytes Relative: 10 %
Lymphs Abs: 0.8 10*3/uL (ref 0.7–4.0)
MCH: 32.9 pg (ref 26.0–34.0)
MCHC: 32.5 g/dL (ref 30.0–36.0)
MCV: 101.1 fL — ABNORMAL HIGH (ref 80.0–100.0)
Monocytes Absolute: 0.8 10*3/uL (ref 0.1–1.0)
Monocytes Relative: 10 %
Neutro Abs: 6.3 10*3/uL (ref 1.7–7.7)
Neutrophils Relative %: 79 %
Platelets: 158 10*3/uL (ref 150–400)
RBC: 3.56 MIL/uL — ABNORMAL LOW (ref 4.22–5.81)
RDW: 13.1 % (ref 11.5–15.5)
WBC: 8.1 10*3/uL (ref 4.0–10.5)
nRBC: 0 % (ref 0.0–0.2)

## 2021-10-26 LAB — GLUCOSE, CAPILLARY
Glucose-Capillary: 127 mg/dL — ABNORMAL HIGH (ref 70–99)
Glucose-Capillary: 182 mg/dL — ABNORMAL HIGH (ref 70–99)
Glucose-Capillary: 142 mg/dL — ABNORMAL HIGH (ref 70–99)
Glucose-Capillary: 181 mg/dL — ABNORMAL HIGH (ref 70–99)
Glucose-Capillary: 140 mg/dL — ABNORMAL HIGH (ref 70–99)

## 2021-10-26 MED ORDER — POLYETHYLENE GLYCOL 3350 17 G PO PACK
17.0000 g | PACK | Freq: Every day | ORAL | Status: DC
  Administered 2021-10-27 – 2021-10-31 (×5): 17 g via ORAL
  Filled 2021-10-26 (×4): qty 1

## 2021-10-26 MED ORDER — FLUTICASONE PROPIONATE 50 MCG/ACT NA SUSP
1.0000 | Freq: Every day | NASAL | Status: DC
Start: 2021-10-26 — End: 2021-11-03
  Administered 2021-10-26 – 2021-11-03 (×8): 1 via NASAL
  Filled 2021-10-26: qty 16

## 2021-10-26 MED ORDER — MAGNESIUM HYDROXIDE 400 MG/5ML PO SUSP
30.0000 mL | Freq: Every day | ORAL | Status: DC | PRN
  Administered 2021-10-28 – 2021-10-30 (×2): 30 mL via ORAL
  Filled 2021-10-26 (×2): qty 30

## 2021-10-26 MED ORDER — OXYCODONE HCL ER 10 MG PO T12A
10.0000 mg | EXTENDED_RELEASE_TABLET | Freq: Every day | ORAL | Status: DC
  Administered 2021-10-27: 06:00:00 10 mg via ORAL
  Filled 2021-10-26: qty 1

## 2021-10-26 MED ORDER — METFORMIN HCL 500 MG PO TABS
250.0000 mg | ORAL_TABLET | Freq: Two times a day (BID) | ORAL | Status: DC
  Administered 2021-10-26 – 2021-11-03 (×16): 250 mg via ORAL
  Filled 2021-10-26 (×16): qty 1

## 2021-10-26 MED ORDER — LIDOCAINE HCL (CARDIAC) PF 100 MG/5ML IV SOSY
PREFILLED_SYRINGE | INTRAVENOUS | Status: AC
  Filled 2021-10-26: qty 5

## 2021-10-26 MED ORDER — LIDOCAINE HCL URETHRAL/MUCOSAL 2 % EX GEL: 1.0000 "application " | Freq: Four times a day (QID) | CUTANEOUS | Status: DC | PRN

## 2021-10-26 NOTE — Progress Notes (Signed)
Patient ID: Kevin [name], male   DOB: 1946/08/15, 75 y.o.   MRN: [id number] Met with the patient to introduce self and review role. Discussed pain management, secondary condition management and urinary retention. Patient unable to void on his own since foley removed; requiring I+O cath. Also noted pain in low back unrelieved by medication. MD made aware. Patient reported he had lost ~25# recently without trying. Discussed high protein, high cal diet and nutritional supplements. Continue to follow along to discharge to address educational needs and collaborate with the team to facilitate preparation for discharge. [name]

## 2021-10-26 NOTE — Progress Notes (Signed)
Pt c/o bladder pressure, but unable to start stream after multiple attempts. Bladder scan showed 538ml, In/out cath done per order- 652ml. Pt stated relief after, and requesting medication to help him void. Provider will be made aware.

## 2021-10-26 NOTE — Progress Notes (Signed)
PROGRESS NOTE   Subjective/Complaints: Having a lot of pain in right buttock, sometimes very limiting for him. Able to sleep last night  ROS: Patient denies fever, rash, sore throat, blurred vision, nausea, vomiting, diarrhea, cough, shortness of breath or chest pain,  headache, or mood change.   Objective:   VAS [place] LOWER EXTREMITY VENOUS (DVT)  Result Date: 10/25/2021  Lower Venous DVT Study Patient Name:  Kevin [name]  Date of Exam:   10/25/2021 Medical Rec #: [id number]           Accession #:    [id number] Date of Birth: 06/07/1946           Patient Gender: M Patient Age:   75 years Exam Location:  [facility] Procedure:      VAS [place] LOWER EXTREMITY VENOUS (DVT) Referring Phys: Lauraine Rinne --------------------------------------------------------------------------------  Indications: Status post fall with pubis ramus fracture, now in rehabilitation.  Comparison Study: No prior study on file Performing Technologist: Sharion Dove RVS  Examination Guidelines: A complete evaluation includes B-mode imaging, spectral Doppler, color Doppler, and power Doppler as needed of all accessible portions of each vessel. Bilateral testing is considered an integral part of a complete examination. Limited examinations for reoccurring indications may be performed as noted. The reflux portion of the exam is performed with the patient in reverse Trendelenburg.  +---------+---------------+---------+-----------+----------+--------------+  RIGHT     Compressibility Phasicity Spontaneity Properties Thrombus Aging  +---------+---------------+---------+-----------+----------+--------------+  CFV       Full            Yes       Yes                                    +---------+---------------+---------+-----------+----------+--------------+  SFJ       Full                                                              +---------+---------------+---------+-----------+----------+--------------+  FV Prox   Full                                                             +---------+---------------+---------+-----------+----------+--------------+  FV Mid    Full                                                             +---------+---------------+---------+-----------+----------+--------------+  FV Distal Full                                                             +---------+---------------+---------+-----------+----------+--------------+  PFV       Full                                                             +---------+---------------+---------+-----------+----------+--------------+  POP       Full            Yes       Yes                                    +---------+---------------+---------+-----------+----------+--------------+  PTV       Full                                                             +---------+---------------+---------+-----------+----------+--------------+  PERO      Full                                                             +---------+---------------+---------+-----------+----------+--------------+   +---------+---------------+---------+-----------+----------+--------------+  LEFT      Compressibility Phasicity Spontaneity Properties Thrombus Aging  +---------+---------------+---------+-----------+----------+--------------+  CFV       Full            Yes       Yes                                    +---------+---------------+---------+-----------+----------+--------------+  SFJ       Full                                                             +---------+---------------+---------+-----------+----------+--------------+  FV Prox   Full                                                             +---------+---------------+---------+-----------+----------+--------------+  FV Mid    Full                                                              +---------+---------------+---------+-----------+----------+--------------+  FV Distal Full                                                             +---------+---------------+---------+-----------+----------+--------------+  PFV       Full                                                             +---------+---------------+---------+-----------+----------+--------------+  POP       Full            Yes       Yes                                    +---------+---------------+---------+-----------+----------+--------------+  PTV       Full                                                             +---------+---------------+---------+-----------+----------+--------------+  PERO      Full                                                             +---------+---------------+---------+-----------+----------+--------------+    Summary: BILATERAL: - No evidence of deep vein thrombosis seen in the lower extremities, bilaterally. -No evidence of popliteal cyst, bilaterally.   *See table(s) above for measurements and observations.    Preliminary    Recent Labs    10/24/21 0053 10/26/21 0601  WBC 7.5 8.1  HGB 11.4* 11.7*  HCT 34.3* 36.0*  PLT 131* 158   Recent Labs    10/24/21 0053 10/26/21 0601  NA 135 136  K 4.2 4.3  CL 106 101  CO2 23 27  GLUCOSE 147* 131*  BUN 13 20  CREATININE 1.13 1.09  CALCIUM 8.7* 9.0    Intake/Output Summary (Last 24 hours) at 10/26/2021 1136 Last data filed at 10/26/2021 1001 Gross per 24 hour  Intake 840 ml  Output 2000 ml  Net -1160 ml        Physical Exam: Vital Signs Blood pressure 128/69, pulse 88, temperature (!) 97.5 F (36.4 C), temperature source Oral, resp. rate 18, weight 74 kg, SpO2 94 %.   Constitutional: No distress . Vital signs reviewed. HEENT: NCAT, EOMI, oral membranes moist Neck: supple Cardiovascular: RRR without murmur. No JVD    Respiratory/Chest: CTA Bilaterally without wheezes or rales. Normal effort    GI/Abdomen: BS +,  non-tender, non-distended Ext: no clubbing, cyanosis, or edema Psych: pleasant and cooperative  Skin: No evidence of breakdown, no evidence of rash.  A few bruises right arm,hip Neurologic: Cranial nerves II through XII intact, motor strength is 5/5 in bilateral deltoid, bicep, tricep, grip.  3- to 3/5 RIght lower ext prox to 4/5 distal, 5/5 Left hip flexor, knee extensors, ankle dorsiflexor and plantar flexor Sensory exam normal sensation to light touch and proprioception in bilateral upper and lower extremities Cerebellar exam normal finger to nose to finger as well as heel to shin in bilateral upper and lower extremities Musculoskeletal: Full range of motion in all 4 extremities.  No joint swelling   Assessment/Plan: 1. Functional deficits which require 3+ hours per day of interdisciplinary therapy in a comprehensive inpatient rehab setting. Physiatrist is providing close team supervision and 24 hour management of active medical problems listed below. Physiatrist and rehab team continue to assess barriers to discharge/monitor patient progress toward functional and medical goals  Care Tool:  Bathing    Body parts bathed by patient: Right arm, Left arm, Chest, Abdomen, Front perineal area, Buttocks, Right upper leg, Left upper leg, Face   Body parts bathed by helper: Right lower leg, Left lower leg     Bathing assist Assist Level: Minimal Assistance - Patient > 75%     Upper Body Dressing/Undressing Upper body dressing   What is the patient wearing?: Pull over shirt    Upper body assist Assist Level: Set up assist    Lower Body Dressing/Undressing Lower body dressing    Lower body dressing activity did not occur: N/A What is the patient wearing?: Pants, Incontinence brief     Lower body assist Assist for lower body dressing: Minimal Assistance - Patient > 75%     Toileting Toileting Toileting Activity did not occur (Clothing management and hygiene only): N/A (no void or  bm)  Toileting assist Assist for toileting: Moderate Assistance - Patient 50 - 74% (with urinal)     Transfers Chair/bed transfer  Transfers assist     Chair/bed transfer assist level: Minimal Assistance - Patient > 75%     Locomotion Ambulation   Ambulation assist      Assist level: Minimal Assistance - Patient > 75% Assistive device: Walker-rolling Max distance: 25'   Walk 10 feet activity   Assist     Assist level: Minimal Assistance - Patient > 75% Assistive device: Walker-rolling   Walk 50 feet activity   Assist Walk 50 feet with 2 turns activity did not occur: Safety/medical concerns         Walk 150 feet activity   Assist Walk 150 feet activity did not occur: Safety/medical concerns         Walk 10 feet on uneven surface  activity   Assist Walk 10 feet on uneven surfaces activity did not occur: Safety/medical concerns         Wheelchair     Assist Is the patient using a wheelchair?: Yes Type of Wheelchair: Manual    Wheelchair assist level: Dependent - Patient 0%      Wheelchair 50 feet with 2 turns activity    Assist        Assist Level: Dependent - Patient 0%   Wheelchair 150 feet activity     Assist      Assist Level: Dependent - Patient 0%   Blood pressure 128/69, pulse 88, temperature (!) 97.5 F (36.4 C), temperature source Oral, resp. rate 18, weight 74 kg, SpO2 94 %.  Medical Problem List and Plan: 1. Functional deficits secondary to fall sustaining right superior inferior pubic ramus fracture/pelvic hematoma.  Status postembolization of right pectineal branch artery.  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  Weightbearing as tolerated.             -patient may  shower             -ELOS/Goals: 7-10d , sup/mod I 2.  Antithrombotics: -DVT/anticoagulation:  Mechanical: Antiembolism stockings, thigh (TED hose) Bilateral lower extremities             -antiplatelet  therapy: Resume Plavix 75  mg daily 10/30/2021 3. Pain Management: Hydrocodone as needed, took 2 this morning prior to therapy.   12/19 alert and appropriate this morning   -add daily dose of oxycontin 10mg  at 0700--watch MS closely 4. Mood: Aricept 5 mg daily, Wellbutrin 150 mg daily, 300 mg daily, Namenda 10 mg twice daily, Ativan every 8 hours as needed             -antipsychotic agents: N/A 5. Neuropsych: This patient is capable of making decisions on his own behalf.  Mild dementia, forgetful but can make decisions with assistance 6. Skin/Wound Care: Routine skin checks 7. Fluids/Electrolytes/Nutrition: encourage PO, hd been losing weight PTA  -protein supp for low albumin 8.  Acute blood loss anemia.  hgb 11.7 12/19 9.  Hyperlipidemia.  Lipitor 10.  Chronic combined systolic diastolic congestive heart failure.  Monitor for any signs of fluid overload   Filed Weights   10/25/21 0641 10/26/21 0500  Weight: 73.1 kg 74 kg    11.  History of left bundle branch block.  Status post pacemaker 2013.  Follow-up outpatient 12.  Type 2 diabetes mellitus.  Hemoglobin A1c 6.7.  SSI.  Patient on Synjardy XR 2 tabs daily prior to admission.   CBG (last 3)  Recent Labs    10/25/21 2103 10/26/21 0602 10/26/21 0931  GLUCAP 152* 127* 182*   12/19 sub-optimal control. Resume only metformin from synjardy for now, 250mg  bid   13.  Urinary retention/BPH.  Urinary tension possibly from pelvic hematoma.  Foley catheter tube removed.    -continue voiding trial   -has required 2 I/O caths for 500cc, bloody urine  -begin flomax 0.4mg  qpm  LOS: 2 days A FACE TO FACE EVALUATION WAS PERFORMED  Meredith Staggers 10/26/2021, 11:36 AM

## 2021-10-26 NOTE — Progress Notes (Signed)
Physical Therapy Session Note  Patient Details  Name: Kevin [name] MRN: [id number] Date of Birth: 12-23-1945  Today's Date: 10/26/2021 PT Individual Time: 1103-1159 PT Individual Time Calculation (min): 56 min   Short Term Goals: Week 1:  PT Short Term Goal 1 (Week 1): Pt will perform bed mobility with supervision PT Short Term Goal 2 (Week 1): Pt will perform transfers with CGA PT Short Term Goal 3 (Week 1): Pt will gait x 50' with CGA PT Short Term Goal 4 (Week 1): Pt will perform stair negotiation with min assist  Skilled Therapeutic Interventions/Progress Updates:  Session 1 Pt received supine in bed, reported 8/10 pain in head and 8/10 dizziness. Pt requested pain meds, nursing notified. Offered repositioning and distraction throughout session for pain modulation. Emphasis of session on improved activity tolerance, vestibular exam and transfers. Pt performed supine <>sit EOB w/S* and vitals obtained, all WNL. Pt continued to report dizziness and repeatedly told therapist he could "see stuff that is not really there" throughout session. At EOB, performed mini vestibular exam: -Smooth pursuit: WNL on R, saccadic intrusions on L. Mild increase in dizziness -Saccades: WNL on R, mild undershooting of L eye when looking to R and overshooting when looking to L. Asymptomatic but slow speed.   Lengthy discussion regarding BPPV, vestibular hypofunction and role that vestibular nerves play w/balance. Pt reported feeling as though he is "moving up and down" when he closes eyes. Plan on performing Dix-Hallpike during later session if pt can tolerate to test for BPPV.   Attempted 3 sit <>stands from elevated EOB to RW, min verbal cues for hand placement and mod-max A to facilitate stand. Pt unable to come to full stand, mod A for posterior lean correction. Pt reported "severe" fatigue and repeatedly inquired about his medications, suspected one was causing fatigue. Pt requested to lay back down  due to increase in dizziness and fatigue, performed sit <>supine w/S*. Pt was left supine in bed, neck pillow on, all needs in reach.    Therapy Documentation Precautions:  Precautions Precautions: Fall Precaution Comments: pacemaker Restrictions Weight Bearing Restrictions: No RLE Weight Bearing: Weight bearing as tolerated   Therapy/Group: Individual Therapy Cruzita Lederer Alejandria Wessells, PT, DPT  10/26/2021, 7:52 AM

## 2021-10-26 NOTE — Progress Notes (Signed)
Inpatient Rehabilitation Admission Medication Review by a Pharmacist  A complete drug regimen review was completed for this patient to identify any potential clinically significant medication issues.  High Risk Drug Classes Is patient taking? Indication by Medication  Antipsychotic No   Anticoagulant No   Antibiotic No   Opioid Yes Hydrocodone-APAP prn, Tylenol prn - Pain  Antiplatelet Yes Clopidogrel (starting 12/23) -  Stroke ppx   Hypoglycemics/insulin Yes Novolog SSI - Type 2 Diabetes  Vasoactive Medication No   Chemotherapy No   Other Yes Bupropion, Donepezil, Memantine - Dementia/Mood Lorazepam prn - Anxiety Atorvastatin - Hyperlipidemia     Type of Medication Issue Identified Description of Issue Recommendation(s)  Drug Interaction(s) (clinically significant)     Duplicate Therapy  Hydrocodone-APAP prn was discontinued during acute stay. Per discharge summary, patient should be taking APAP 500 mg q6h prn moderate pain or headache. However, in inpatient rehab, patient is receiving APAP 650 mg q6h prn mild pain and Hydrocodone-APAP prn moderate pain. Continue current order on CIR  Allergy     No Medication Administration End Date     Incorrect Dose     Additional Drug Therapy Needed  The following medications were noted in the discharge summary, but have not been resumed in inpatient rehab:  Fluticasone, Loratadine - Allergies Promethazine prn - Nausea/Vomiting  Synjardy XR - Type 2 Diabetes Ok to resume flonase and metformin per Dr. Naaman Plummer  Significant med changes from prior encounter (inform family/care partners about these prior to discharge).    Other       Clinically significant medication issues were identified that warrant physician communication and completion of prescribed/recommended actions by midnight of the next day:  [facility]  Name of provider notified for urgent issues identified: Dr. Naaman Plummer  Provider Method of Notification: Secure chat    Pharmacist  comments: Ok to resume flonase and metformin for now. Time spent performing this drug regimen review (minutes):  [address] PharmD, [place], AAHIVP, CPP Infectious Disease Pharmacist 10/26/2021 11:50 AM

## 2021-10-26 NOTE — Progress Notes (Signed)
Inpatient Rehabilitation Care Coordinator Assessment and Plan Patient Details  Name: Kevin [name] MRN: [id number] Date of Birth: 09-02-1946  Today's Date: 10/26/2021  Hospital Problems: Principal Problem:   Pubic ramus fracture ([place]) Active Problems:   Fracture of multiple pubic rami with routine healing, right  Past Medical History:  Past Medical History:  Diagnosis Date   Agent orange exposure    in [place]   Anemia    Heart block AV second degree    a. s/p PPM implant with subsequent CRTD upgrade   High cholesterol    History of colon polyps    LBBB (left bundle branch block)    Nonischemic cardiomyopathy ([place])    a. MDT CRTD upgrade 2016   Pacemaker 08/27/2013   Dual-chamber Medtronic Adapta implanted January 2013 for bradycardia with alternating bundle branch block and 2:1 atrioventricular block    Prostate cancer ([place])    "not been treated yet; on a wait and see" (01/22/2015)   Type II diabetes mellitus ([place])    Past Surgical History:  Past Surgical History:  Procedure Laterality Date   BASAL CELL CARCINOMA EXCISION     "back, face, neck"   BI-VENTRICULAR IMPLANTABLE CARDIOVERTER DEFIBRILLATOR UPGRADE N/A 01/22/2015   MDT CRTD upgrade by Dr Rayann Heman   BIV ICD GENERATOR CHANGEOUT N/A 08/28/2020   Procedure: BIV ICD GENERATOR CHANGEOUT;  Surgeon: [name], MD;  Location: [place] CV LAB;  Service: Cardiovascular;  Laterality: N/A;   CARDIAC CATHETERIZATION  01/27/2012   normal coronaries, dilated LV c/w nonischemic cardiomyopathy   EXAMINATION UNDER ANESTHESIA  04/06/2012   Procedure: EXAM UNDER ANESTHESIA;  Surgeon: Marcello Moores A. Cornett, MD;  Location: [place];  Service: General;  Laterality: N/A;   INCISION AND DRAINAGE PERIRECTAL ABSCESS  ~ 2010; 2015   INGUINAL HERNIA REPAIR Left 1980   IR ANGIOGRAM PELVIS SELECTIVE OR SUPRASELECTIVE  10/20/2021   IR ANGIOGRAM VISCERAL SELECTIVE  10/20/2021   IR EMBO ART  VEN HEMORR LYMPH EXTRAV  INC GUIDE ROADMAPPING   10/20/2021   IR US GUIDE VASC ACCESS LEFT  10/20/2021   LEFT HEART CATHETERIZATION WITH CORONARY ANGIOGRAM N/A 01/27/2012   Procedure: LEFT HEART CATHETERIZATION WITH CORONARY ANGIOGRAM;  Surgeon: Troy Sine, MD;  [facility];  Service: Cardiovascular;  Laterality: N/A;   NM MYOCAR PERF WALL MOTION  01/21/2012   mod-severe perfusion defect due to infarct/scar w/mild perinfarct ischemia in the apical,basal inferoseptal,basal inferior,mid inferoseptal,midinferior and apical inferior regions   pacemaker battery     PERMANENT PACEMAKER INSERTION N/A 12/01/2011   Medtronic implanted by Dr Sallyanne Kuster   PROSTATE BIOPSY     PROSTATE BIOPSY     shrapnel surgery     "in [place]"   TONSILLECTOMY     Social History:  reports that he quit smoking about 37 years ago. His smoking use included cigarettes. He has a 20.00 pack-year smoking history. He has never used smokeless tobacco. He reports current alcohol use. He reports that he does not use drugs.  Family / Support Systems Marital Status: Married Patient Roles: Spouse, Parent Spouse/Significant Other: [name] [phone]  [phone] Children: Malachy Mood [phone] Other Supports: Friends and nieghbors Anticipated Caregiver: Wife Ability/Limitations of Caregiver: Wife is a Pharmacist, hospital at [employer] pt my be alone at times Caregiver Availability: Other (Comment) (May be times he is alone) Family Dynamics: Close knit family who are involved and supportive. Pt feels he will be fine at home for a few hours alone and will be able to meet his  needs while there  Social History Preferred language: English Religion: [employer] Cultural Background: No issues Education: Medical sales representative - How often do you need to have someone help you when you read instructions, pamphlets, or other written material from your doctor or pharmacy?: Rarely Writes: Yes Employment Status: Retired Public relations account executive Issues: No  issues Guardian/Conservator: None-according to MD pt is capable of making his own decisions while here   Abuse/Neglect Abuse/Neglect Assessment Can Be Completed: Yes Physical Abuse: Denies Verbal Abuse: Denies Sexual Abuse: Denies Exploitation of patient/patient's resources: Denies Self-Neglect: Denies  Patient response to: Social Isolation - How often do you feel lonely or isolated from those around you?: Rarely  Emotional Status Pt's affect, behavior and adjustment status: Pt is motivated and hopes to be mod/i by discharge from here. He has no worries about being home alone some and feels can manage. He has always been independent and self reliant Recent Psychosocial Issues: other health issues managed Psychiatric History: No history pt is coping appropriately and positive regarding his recovery and is focused now on his voiding he needs to be able to void at discharge Substance Abuse History: No issues  Patient / Family Perceptions, Expectations & Goals Pt/Family understanding of illness & functional limitations: Pt and wife can explain his fall and fractures due to this fall. He feels he has a good understanding of his treatment plan moving forward and feels good today. Main issue is his voiding and not being I & O cath Premorbid pt/family roles/activities: husband, father, grandfather, retiree, veteran, neighbor, church member, etc Anticipated changes in roles/activities/participation: resum Pt/family expectations/goals: Pt states: " I want to do for myself before I leave here."  Wife states: " I will be off some for the holiday but will go back to work."  [employer]: Other (Comment) [place]) Premorbid Home Care/DME Agencies: None Transportation available at discharge: Pt did drive, ow will rely upon family Is the patient able to respond to transportation needs?: Yes In the past 12 months, has lack of transportation kept you from medical appointments or  from getting medications?: No In the past 12 months, has lack of transportation kept you from meetings, work, or from getting things needed for daily living?: No  Discharge Planning Living Arrangements: Spouse/significant other Support Systems: Spouse/significant other, Children, Other relatives, Friends/neighbors, Church/faith community Type of Residence: Private residence Insurance Resources: Multimedia programmer (specify) ([place] Government social research officer) Financial Resources: Social Security, Family Support Financial Screen Referred: No Living Expenses: Own Money Management: Patient, Spouse Does the patient have any problems obtaining your medications?: No Home Management: Both pt and wife Patient/Family Preliminary Plans: Return home with wife who will be off for the holidays but does work part time at [employer]. Pt hopes to be independent by discharge and be voiding on his own. Aware therapy team setting goals and ELOS Care Coordinator Barriers to Discharge: Decreased caregiver support Care Coordinator Anticipated Follow Up Needs: HH/OP  Clinical Impression Pleasant gentleman who is motivated to do well here and regain his independence. His wife is involved and willing to assist. Will work on discharge needs and make him aware team's recommendation is 24/7 supervision. Pt feels he can be left alone at times due to wife works  Elease Hashimoto 10/26/2021, 10:44 AM

## 2021-10-26 NOTE — Progress Notes (Signed)
Inpatient Rehabilitation  Patient information reviewed and entered into eRehab system by Rondrick Barreira Kagan Hietpas, OTR/L.   Information including medical coding, functional ability and quality indicators will be reviewed and updated through discharge.    

## 2021-10-26 NOTE — Progress Notes (Signed)
Physical Therapy Session Note  Patient Details  Name: Kevin [name] MRN: [id number] Date of Birth: 07-17-1946  Today's Date: 10/26/2021 PT Individual Time: 1305-1350 PT Individual Time Calculation (min): 45 min  and Today's Date: 10/26/2021 PT Missed Time: 30 Minutes Missed Time Reason: Patient unwilling to participate;Patient fatigue  Short Term Goals: Week 1:  PT Short Term Goal 1 (Week 1): Pt will perform bed mobility with supervision PT Short Term Goal 2 (Week 1): Pt will perform transfers with CGA PT Short Term Goal 3 (Week 1): Pt will gait x 50' with CGA PT Short Term Goal 4 (Week 1): Pt will perform stair negotiation with min assist  Skilled Therapeutic Interventions/Progress Updates:  Pt received supine in bed, wife present. Pt denied pain but reported recent emesis after attempting to eat lunch. Provided crackers, pudding and ginger ale to help settle stomach and build rapport. After eating a few crackers, pt agreeable to PT. Emphasis of session on transfers, improved activity tolerance and gait training. Pt performed supine <> sit w/S* and reported 6/10 dizziness, vitals WNL. Mod verbal cues to maintain upright posture and forward gaze, pt reported decrease in dizziness when looking forward compared to looking at ground.    Blocked sit <>stand practice from EOB for improved BLE strength and functional mobility: -Pt performed 2 sit <>stands w/min-mod A for trunk support and facilitate hip extension from elevated bed to RW. Min verbal cues for hand placement. Pt able to stand for 60-120s, min verbal cues for upright gaze as pt has tendency to look down.   Pt reported fatigue and requested to sit down for short break, but began to instruct wife on tucking him into bed, fluffing his pillows and moving his legs for him. Therapist reminded pt he had 30 min left in therapy session, but pt agitated, began to speak harshly to his wife and perseverated on being dizzy. Missed 30 minutes of  skilled PT due to pt agitation/refusal. Pt was left supine in bed, all needs in reach, wife present.   Therapy Documentation Precautions:  Precautions Precautions: Fall Precaution Comments: pacemaker Restrictions Weight Bearing Restrictions: No RLE Weight Bearing: Weight bearing as tolerated   Therapy/Group: Individual Therapy Cruzita Lederer Michaelle Bottomley, PT, DPT  10/26/2021, 2:11 PM

## 2021-10-26 NOTE — Progress Notes (Signed)
Occupational Therapy Session Note  Patient Details  Name: Kevin [name] MRN: [id number] Date of Birth: [birth year]  Today's Date: 10/26/2021 OT Individual Time: 2505-3976 OT Individual Time Calculation (min): 47 min    Short Term Goals: Week 1:  OT Short Term Goal 1 (Week 1): Pt will complete 1/3 components of toileting with CGA for balance OT Short Term Goal 2 (Week 1): Pt will complete LB self care at sit<stand level with no more than CGA for balance assistance OT Short Term Goal 3 (Week 1): Pt will thread 1 LE into pants with supervision using AE as needed  Skilled Therapeutic Interventions/Progress Updates:      Pt seen for BADL retraining of toileting, bathing, and dressing with a focus on balance and activity tolerance. Pt very energetic this am and ready to participate but did state he was struggling with dizziness. He stated this has been an ongoing problem.   Pt able to sit to EOB with only CGA. Due to his dizziness, had pt do B/d  from EOB.  RW height adjusted to pt's height as he is 6'4". He is able to do all UB with set up.  Min A to doff pants and wash LB. Pt felt urge to urinate (has been feeling urge but unable to urinate - has been getting cathed). Pt stood with min A to RW and tolerated standing for 5 min while holding urinal in R hand trying to go. Min A/ CGA for balance support.  Unable to void.  Mod A to start pants over feet due to hip pain but then pt able to pull over hips in standing.    Pt does well with cues to push up to stand with B hand but 3x in a row he kept hands on RW with sitting down and did not follow cues to reach back for controlled descent. Will need to work on this skill.   Due to continued dizziness, had pt lie back down.  Did not have time to don his TED hose but informed next therapist.  Pt resting in bed with all needs met.  Bed alarm set.   Therapy Documentation Precautions:  Precautions Precautions: Fall Precaution Comments:  pacemaker Restrictions Weight Bearing Restrictions: No RLE Weight Bearing: Weight bearing as tolerated  Pain: Pain Assessment Pain Scale: 0-10 Pain Score: 7  Pain Location: Pelvis Pain Descriptors / Indicators: Aching;Throbbing Pain Frequency: Intermittent Pain Onset: On-going Patients Stated Pain Goal: 4 Pain Intervention(s): Medication (See eMAR) ADL: ADL Eating: Not assessed Grooming: Setup Where Assessed-Grooming: Edge of bed Upper Body Bathing: Supervision/safety Where Assessed-Upper Body Bathing: Edge of bed Lower Body Bathing: Minimal assistance Where Assessed-Lower Body Bathing: Edge of bed Upper Body Dressing: Supervision/safety Where Assessed-Upper Body Dressing: Edge of bed Lower Body Dressing: Moderate assistance Where Assessed-Lower Body Dressing: Edge of bed Toileting: Not assessed Toilet Transfer: Minimal assistance Toilet Transfer Method: Ambulating (RW) Tub/Shower Transfer: Not assessed     Therapy/Group: Individual Therapy  [place] 10/26/2021, 9:35 AM

## 2021-10-26 NOTE — Progress Notes (Signed)
Inpatient [facility] Individual Statement of Services  Patient Name:  Kevin [name]  Date:  10/26/2021  Welcome to the [facility].  Our goal is to provide you with an individualized program based on your diagnosis and situation, designed to meet your specific needs.  With this comprehensive rehabilitation program, you will be expected to participate in at least 3 hours of rehabilitation therapies Monday-Friday, with modified therapy programming on the weekends.  Your rehabilitation program will include the following services:  Physical Therapy (PT), Occupational Therapy (OT), 24 hour per day rehabilitation nursing, Therapeutic Recreaction (TR), Care Coordinator, Rehabilitation Medicine, Nutrition Services, and Pharmacy Services  Weekly team conferences will be held on Tuesday to discuss your progress.  Your Inpatient Rehabilitation Care Coordinator will talk with you frequently to get your input and to update you on team discussions.  Team conferences with you and your family in attendance may also be held.  Expected length of stay: 10-12 days  Overall anticipated outcome: supervision level  Depending on your progress and recovery, your program may change. Your Inpatient Rehabilitation Care Coordinator will coordinate services and will keep you informed of any changes. Your Inpatient Rehabilitation Care Coordinator's name and contact numbers are listed  below.  The following services may also be recommended but are not provided by the [facility] will be made to provide these services after discharge if needed.  Arrangements include referral to agencies that provide these services.  Your insurance has been verified to be:  [employer] Your primary doctor is:  Carolann Littler  Pertinent information will be shared with  your doctor and your insurance company.  Inpatient Rehabilitation Care Coordinator:  Ovidio Kin, [place] or Emilia Beck  Information discussed with and copy given to patient by: Elease Hashimoto, 10/26/2021, 10:46 AM

## 2021-10-27 LAB — GLUCOSE, CAPILLARY
Glucose-Capillary: 153 mg/dL — ABNORMAL HIGH (ref 70–99)
Glucose-Capillary: 186 mg/dL — ABNORMAL HIGH (ref 70–99)
Glucose-Capillary: 170 mg/dL — ABNORMAL HIGH (ref 70–99)

## 2021-10-27 MED ORDER — ENSURE ENLIVE PO LIQD
237.0000 mL | Freq: Two times a day (BID) | ORAL | Status: DC
  Administered 2021-10-27 – 2021-11-03 (×8): 237 mL via ORAL

## 2021-10-27 NOTE — Progress Notes (Signed)
Patient ID: Kevin [name], male   DOB: [birth year], 75 y.o.   MRN: [id number]  Met with pt and wife who is here to update team conference supervision level goals and target discharge date of 12/27. Pt had wanted to go home by Christmas and aware to discuss with therapy team and MD. He feels if he pushes himself he can get to his goals sooner. Discussed his memory issues and dizziness and this is hindering him. He reports he was diagnosed with dementia but was still driving and volunteering at the hospital. Made aware MD will not let him drive until healed and follow up with ortho MD. He is ok with this. Wife will be out until 1/4 and can be out longer if needed. Will work on equipment and discharge needs.

## 2021-10-27 NOTE — Progress Notes (Signed)
Physical Therapy Session Note  Patient Details  Name: Kevin [name] MRN: [id number] Date of Birth: [birth year]  Today's Date: 10/27/2021 PT Individual Time: 1002-1026 PT Individual Time Calculation (min): 24 min   Short Term Goals: Week 1:  PT Short Term Goal 1 (Week 1): Pt will perform bed mobility with supervision PT Short Term Goal 2 (Week 1): Pt will perform transfers with CGA PT Short Term Goal 3 (Week 1): Pt will gait x 50' with CGA PT Short Term Goal 4 (Week 1): Pt will perform stair negotiation with min assist  Skilled Therapeutic Interventions/Progress Updates:  Session 1 Pt received supine in bed, very lethargic and denied pain at rest. Initially reported no dizziness and then few minutes later reported 10/10 dizziness with no change in body or head position. Emphasis of session on transfers and gait training. Pt performed supine <>sit EOB w/S* and after several minutes of sitting EOB, performed sit <>stand w/min A. Noted very poor hand placement, as pt pulled RW towards himself despite verbal cues from therapist. Reminded pt of proper hand placement, pt demonstrated no recall from education day prior. Pt ambulated 15' from bed to recliner w/RW and CGA. Noted significant kyphotic posture, downward gaze, antalgic gait pattern and sustained knee flexion bilaterally. Min verbal cues for forward gaze to facilitate upright posture, but pt did not correct. Pt requested snacks once seated in chair due to not eating much breakfast, provided crackers and ensure, okayed by nursing. Pt was left seated in recliner in room, all needs in reach.    Therapy Documentation Precautions:  Precautions Precautions: Fall Precaution Comments: pacemaker Restrictions Weight Bearing Restrictions: No RLE Weight Bearing: Weight bearing as tolerated    Therapy/Group: Individual Therapy Cruzita Lederer Gabrelle Roca, PT, DPT  10/27/2021, 7:47 AM

## 2021-10-27 NOTE — Progress Notes (Signed)
Physical Therapy Session Note  Patient Details  Name: Kevin [name] MRN: [id number] Date of Birth: 02-Nov-1946  Today's Date: 10/27/2021 PT Individual Time: 1330-1403 PT Individual Time Calculation (min): 33 min  and Today's Date: 10/27/2021 PT Missed Time: 12 Minutes Missed Time Reason: Patient fatigue  Short Term Goals: Week 1:  PT Short Term Goal 1 (Week 1): Pt will perform bed mobility with supervision PT Short Term Goal 2 (Week 1): Pt will perform transfers with CGA PT Short Term Goal 3 (Week 1): Pt will gait x 50' with CGA PT Short Term Goal 4 (Week 1): Pt will perform stair negotiation with min assist  Skilled Therapeutic Interventions/Progress Updates:  Pt received standing, wife present, attempting to urinate w/NT. Pt able to urinate after several minutes of standing. Pt disgruntled that "therapy is back", reminded pt that he receives 3 hours/day while in CIR. After mod encouragement, pt agreed to attempt to walk. Pt ambulated 85' w/RW and min A for stabilization. Noted downward gaze, antalgic gait pattern, kyphotic posture causing RW to be too far away from pt and variable gait pattern due to pt attempting to catch back up to RW. Min verbal cues for posture and to maintain safe distance to RW. Pt requested to rest and performed stand <> sit to [facility] w/min A for eccentric control, poor placement of hands despite verbal cues. Pt reported increased dizziness and tinnitus, requested to return to room to obtain hearing aids. Sit <>stand from [facility] to RW, noted poor anterior weight shift and improper hand placement, min A for anterior weight shift and stabilization of RW. Pt ambulated 65' back to room w/min A, demonstrating same gait impairments as listed above. Stand <>sit at EOB w/min A for eccentric control and poor hand placement. Pt able to donn hearing aids w/set-up assist and reported he was too tired to continue in therapy. Pt performed sit <>supine w/S* and was left supine in bed, all  needs in reach. Missed 12 minutes of skilled PT due to pt fatigue.   Therapy Documentation Precautions:  Precautions Precautions: Fall Precaution Comments: pacemaker Restrictions Weight Bearing Restrictions: No RLE Weight Bearing: Weight bearing as tolerated  Therapy/Group: Individual Therapy Cruzita Lederer Murdis Flitton, PT, DPT  10/27/2021, 2:08 PM

## 2021-10-27 NOTE — Progress Notes (Signed)
Initial Nutrition Assessment  DOCUMENTATION CODES:   Non-severe (moderate) malnutrition in context of chronic illness, Underweight  INTERVENTION:  Provide Ensure Enlive po BID, each supplement provides 350 kcal and 20 grams of protein.  Encourage adequate PO intake.   NUTRITION DIAGNOSIS:   Moderate Malnutrition related to chronic illness (CHF) as evidenced by moderate fat depletion, moderate muscle depletion.  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 Presented 10/20/2021 after mechanical fall. CT of the right hip as well as CT angiography of abdomen and pelvis showed mildly displaced right superior and inferior pubic ramus fracture. Functional deficits secondary to fall sustaining right superior inferior pubic ramus fracture/pelvic hematoma.  Status postembolization of right pectineal branch artery. 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  Meal completion has been varied from 25-100% with 100% at lunch today. Pt reports he has been eating well PTA however reports taste changes which has been ongoing over the past ~2 years. Pt reports unintentional weight loss with usual body weight of ~185 lbs which he reports weighing 1 year ago. Noted pt to be re-weighed for weight accuracy. Pt reports consuming 1-2 Glucerna shakes at home to aid in prevention of weight loss. RD to order nutritional supplements to aid in caloric and protein needs.   NUTRITION - FOCUSED PHYSICAL  EXAM:  Flowsheet Row Most Recent Value  Orbital Region Moderate depletion  Upper Arm Region Moderate depletion  Thoracic and Lumbar Region Moderate depletion  Buccal Region Moderate depletion  Temple Region Moderate depletion  Clavicle Bone Region Moderate depletion  Clavicle and Acromion Bone Region Moderate depletion  Scapular Bone Region Unable to assess  Dorsal Hand Unable to assess  Patellar Region Moderate depletion  Anterior Thigh Region Severe depletion  Posterior Calf Region Moderate depletion  Edema (RD Assessment) Mild  Hair Reviewed  Eyes Reviewed  Mouth Reviewed  Skin Reviewed  Nails Reviewed      Labs and medications reviewed.   Diet Order:   Diet Order             Diet Carb Modified Fluid consistency: Thin; Room service appropriate? Yes with Assist  Diet effective now                   EDUCATION NEEDS:   Not appropriate for education at this time  Skin:  Skin Assessment: Reviewed RN Assessment  Last BM:  12/18  Height:   Ht Readings from Last 1 Encounters:  10/20/21 6\' 4"  (1.93 m)    Weight:   Wt Readings from Last 1 Encounters:  10/27/21 68.2 kg    Ideal Body Weight:  90.5 kg  BMI:  Body mass index is 18.3 kg/m.  Estimated Nutritional Needs:   Kcal:  2000-2200  Protein:  100-110 grams  Fluid:  >/= 2 L/day  Corrin Parker, MS, RD, LDN RD pager number/after hours weekend pager number on Amion.

## 2021-10-27 NOTE — Patient Care Conference (Signed)
Inpatient RehabilitationTeam Conference and Plan of Care Update Date: 10/27/2021   Time: 12:05 PM    Patient Name: Kevin [name]      Medical Record Number: [id number]  Date of Birth: February 22, 1946 Sex: Male         Room/Bed: 4W21C/4W21C-01 Payor Info: Payor: [employer] / Plan: [place] / Product Type: *No Product type* /    Admit Date/Time:  10/24/2021  5:02 PM  Primary Diagnosis:  Pubic ramus fracture [facility])  Hospital Problems: Principal Problem:   Pubic ramus fracture ([place]) Active Problems:   Fracture of multiple pubic rami with routine healing, right    Expected Discharge Date: Expected Discharge Date: 11/03/21  Team Members Present: Physician leading conference: Dr. Courtney Heys Social Worker Present: Ovidio Kin, LCSW Nurse Present: Dorien Chihuahua, RN PT Present: Francena Hanly, PT OT Present: Meriel Pica, OT SLP Present: Weston Anna, SLP PPS Coordinator present : Gunnar Fusi, SLP     Current Status/Progress Goal Weekly Team Focus  Bowel/Bladder   continent of b/b  remain continent  toilet q3-4 in and out cath <350   Swallow/Nutrition/ Hydration             ADL's   mod A LB dressing, min Toileting, min transfers - has been dealing with dizziness  Supervision  ADL transfers, functional mobility, ADL training.   Mobility   Min-mod sit <>stand, S* bed mobility, no gait yet  S*  Gait training, transfers, global conditioning, pt/fam edu   Communication             Safety/Cognition/ Behavioral Observations            Pain   pain 4-5/10  pain <3  assess q shift and prn   Skin     Bruising right groin and hip   Skin intact, bruising resolving   Monitor skin and provide repositioning assistance    Discharge Planning:  HOme with wife who does work part time as a Pharmacist, hospital, made aware team's recommnedation is 24/7 supervision. He feels can be alone at home   Team Discussion: Constipation and urinary retention addressed.  Patient is refusing therapy sessions; reporting dizziness and fatigue. Staff question baseline cognition with hx of alzheimer's dementia with poor memory and perseverating although patient was volunteering and driving PTA.   Patient on target to meet rehab goals: no, little participation; difficult to assess.   *See Care Plan and progress notes for long and short-term goals.   Revisions to Treatment Plan:  Vertigo testing/eval SLP evaluation of cognition   Teaching Needs: Safety, medications, secondary risk management, toileting, transfers, etc.  Current Barriers to Discharge: Decreased caregiver support and Behavior  Possible Resolutions to Barriers: Family education with wife SW to follow up with wife on baseline cognition level    Medical Summary Current Status: got MOM and miralax- flomax- didn't get- voiding now- no more in/out caths; LBM 3 days-continent of B/B  Barriers to Discharge: Other (comments);Behavior;Decreased family/caregiver support;Home Dentist;Medical stability;Weight;Medication compliance;Weight bearing restrictions  Barriers to Discharge Comments: wife is school teacher- pt thinks could do mod I- continued c/o dizziness- Alzheimer's? was driving/volunteering? perseverates on topics. Possible Resolutions to [employer] Focus: won't participate with therapy- bossy/not polite with wife; goals supervision; is min A- -CGA; limitations- dizziness- no orthostatic hypotension; c/o diplopia; will order vertigo eval- SLP eval- wil see what his baseline cognition is?  d/c 12/27   Continued Need for Acute Rehabilitation Level of Care: The patient requires daily medical management by  a physician with specialized training in physical medicine and rehabilitation for the following reasons: Direction of a multidisciplinary physical rehabilitation program to maximize functional independence : Yes Medical management of patient stability for increased activity  during participation in an intensive rehabilitation regime.: Yes Analysis of laboratory values and/or radiology reports with any subsequent need for medication adjustment and/or medical intervention. : Yes   I attest that I was present, lead the team conference, and concur with the assessment and plan of the team.   Dorien Chihuahua B 10/27/2021, 12:27 PM

## 2021-10-27 NOTE — Progress Notes (Signed)
Physical Therapy Session Note  Patient Details  Name: Kevin [name] MRN: [id number] Date of Birth: 14-Aug-1946  Today's Date: 10/27/2021 PT Individual Time: 1100-1126 PT Individual Time Calculation (min): 26 min   Today's Date: 10/27/2021 PT Missed Time: 34 Minutes Missed Time Reason: Patient fatigue  Short Term Goals: Week 1:  PT Short Term Goal 1 (Week 1): Pt will perform bed mobility with supervision PT Short Term Goal 2 (Week 1): Pt will perform transfers with CGA PT Short Term Goal 3 (Week 1): Pt will gait x 50' with CGA PT Short Term Goal 4 (Week 1): Pt will perform stair negotiation with min assist  Skilled Therapeutic Interventions/Progress Updates:   Received pt semi-reclined in bed. Pt reported just getting back into bed from the recliner and feeling "the most dizzy he's felt since he's been here". Pt agreeable to PT treatment with encouragement and denied any pain at rest but reported increased pain in pelvis with mobility. Session with emphasis on functional mobility/transfers, generalized strengthening, dynamic standing balance/coordination, gait training, and improved activity tolerance. Pt transferred semi-reclined<>sitting EOB with HOB extremely elevated and use of bedrails with close supervision. Pt reported having "no energy" and feeling nauseous upon sitting EOB but agreed to "do as much as he could".   BP Readings: Semi-reclined: 115/69 Sitting: 114/60 Standing: 102/66 Sit<>stand from EOB with RW and CGA and pt ambulated 90ft x1 and 23ft x 1 with RW and min A. Pt ambulates with flexed trunk, decreased cadence, decreased bilateral foot clearance, and downward gaze but denied any increase in dizziness. Pt then performed the following exercises sitting EOB with supervision and verbal cues for technique with emphasis on LE strength (noted decreased coordination bilaterally): -hip flexion 2x15 bilaterally -LAQ 2x15 bilaterally  -hip adduction pillow squeezes 2x20 Pt  continued to c/o fatigue and requested to rest prior to next PT session this afternoon. Sit<>semi-reclined with HOB elevated and used of bedrails with supervision. Concluded session with pt semi-reclined in bed, needs within reach, and bed alarm on. 34 minutes missed of skilled physical therapy due to fatigue.   Therapy Documentation Precautions:  Precautions Precautions: Fall Precaution Comments: pacemaker Restrictions Weight Bearing Restrictions: No RLE Weight Bearing: Weight bearing as tolerated  Therapy/Group: Individual Therapy Alfonse Alpers PT, DPT   10/27/2021, 7:50 AM

## 2021-10-27 NOTE — Progress Notes (Signed)
PROGRESS NOTE   Subjective/Complaints: Pt reports dizziness- but the meds help.  LBM 3 days ago Peeing OK now.  Pain 0/10 at rest, but jumps up to 8/10 with therapy/walking- suggested pretreating for therapy instead of taking afterwards.    ROS:  Pt denies SOB, abd pain, CP, N/V/(+) C/D, and vision changes    Objective:   VAS [place] LOWER EXTREMITY VENOUS (DVT)  Result Date: 10/26/2021  Lower Venous DVT Study Patient Name:  Kevin [name]  Date of Exam:   10/25/2021 Medical Rec #: [id number]           Accession #:    [id number] Date of Birth: 01/19/1946           Patient Gender: M Patient Age:   75 years Exam Location:  [facility] Procedure:      VAS [place] LOWER EXTREMITY VENOUS (DVT) Referring Phys: Lauraine Rinne --------------------------------------------------------------------------------  Indications: Status post fall with pubis ramus fracture, now in rehabilitation.  Comparison Study: No prior study on file Performing Technologist: Sharion Dove RVS  Examination Guidelines: A complete evaluation includes B-mode imaging, spectral Doppler, color Doppler, and power Doppler as needed of all accessible portions of each vessel. Bilateral testing is considered an integral part of a complete examination. Limited examinations for reoccurring indications may be performed as noted. The reflux portion of the exam is performed with the patient in reverse Trendelenburg.  +---------+---------------+---------+-----------+----------+--------------+  RIGHT     Compressibility Phasicity Spontaneity Properties Thrombus Aging  +---------+---------------+---------+-----------+----------+--------------+  CFV       Full            Yes       Yes                                    +---------+---------------+---------+-----------+----------+--------------+  SFJ       Full                                                              +---------+---------------+---------+-----------+----------+--------------+  FV Prox   Full                                                             +---------+---------------+---------+-----------+----------+--------------+  FV Mid    Full                                                             +---------+---------------+---------+-----------+----------+--------------+  FV Distal Full                                                             +---------+---------------+---------+-----------+----------+--------------+  PFV       Full                                                             +---------+---------------+---------+-----------+----------+--------------+  POP       Full            Yes       Yes                                    +---------+---------------+---------+-----------+----------+--------------+  PTV       Full                                                             +---------+---------------+---------+-----------+----------+--------------+  PERO      Full                                                             +---------+---------------+---------+-----------+----------+--------------+   +---------+---------------+---------+-----------+----------+--------------+  LEFT      Compressibility Phasicity Spontaneity Properties Thrombus Aging  +---------+---------------+---------+-----------+----------+--------------+  CFV       Full            Yes       Yes                                    +---------+---------------+---------+-----------+----------+--------------+  SFJ       Full                                                             +---------+---------------+---------+-----------+----------+--------------+  FV Prox   Full                                                             +---------+---------------+---------+-----------+----------+--------------+  FV Mid    Full                                                              +---------+---------------+---------+-----------+----------+--------------+  FV Distal Full                                                             +---------+---------------+---------+-----------+----------+--------------+  PFV       Full                                                             +---------+---------------+---------+-----------+----------+--------------+  POP       Full            Yes       Yes                                    +---------+---------------+---------+-----------+----------+--------------+  PTV       Full                                                             +---------+---------------+---------+-----------+----------+--------------+  PERO      Full                                                             +---------+---------------+---------+-----------+----------+--------------+     Summary: BILATERAL: - No evidence of deep vein thrombosis seen in the lower extremities, bilaterally. -No evidence of popliteal cyst, bilaterally.   *See table(s) above for measurements and observations. Electronically signed by Servando Snare MD on 10/26/2021 at 4:33:34 PM.    Final    Recent Labs    10/26/21 0601  WBC 8.1  HGB 11.7*  HCT 36.0*  PLT 158   Recent Labs    10/26/21 0601  NA 136  K 4.3  CL 101  CO2 27  GLUCOSE 131*  BUN 20  CREATININE 1.09  CALCIUM 9.0    Intake/Output Summary (Last 24 hours) at 10/27/2021 0954 Last data filed at 10/27/2021 0752 Gross per 24 hour  Intake 697 ml  Output 1500 ml  Net -803 ml        Physical Exam: Vital Signs Blood pressure 128/67, pulse 91, temperature 98.3 F (36.8 C), resp. rate 15, weight 68.2 kg, SpO2 96 %.    General: awake, alert, appropriate, sitting up in bed; NAD HENT: conjugate gaze; oropharynx moist CV: regular rate; no JVD Pulmonary: CTA B/L; no W/R/R- good air movement GI: soft, NT, ND, (+)BS; hypoactive Psychiatric: appropriate- flat affect Neurological: alert; very trace delayed  responses Ext: no clubbing, cyanosis, or edema Psych: pleasant and cooperative  Skin: No evidence of breakdown, no evidence of rash.  A few bruises right arm,hip Neurologic: Cranial nerves II through XII intact, motor strength is 5/5 in bilateral deltoid, bicep, tricep, grip.  3- to 3/5 RIght lower ext prox to 4/5 distal, 5/5 Left hip flexor, knee extensors, ankle dorsiflexor and plantar flexor Sensory exam normal sensation to light touch and proprioception in bilateral upper and lower extremities Cerebellar exam normal finger to nose to finger as well as heel to shin in bilateral upper and lower extremities Musculoskeletal: Full range of motion in all 4 extremities. No joint swelling  Assessment/Plan: 1. Functional deficits which require 3+ hours per day of interdisciplinary therapy in a comprehensive inpatient rehab setting. Physiatrist is providing close team supervision and 24 hour management of active medical problems listed below. Physiatrist and rehab team continue to assess barriers to discharge/monitor patient progress toward functional and medical goals  Care Tool:  Bathing    Body parts bathed by patient: Right arm, Left arm, Chest, Abdomen, Front perineal area, Buttocks, Right upper leg, Left upper leg, Face   Body parts bathed by helper: Right lower leg, Left lower leg     Bathing assist Assist Level: Minimal Assistance - Patient > 75%     Upper Body Dressing/Undressing Upper body dressing   What is the patient wearing?: Pull over shirt    Upper body assist Assist Level: Set up assist    Lower Body Dressing/Undressing Lower body dressing      What is the patient wearing?: Pants, Incontinence brief     Lower body assist Assist for lower body dressing: Minimal Assistance - Patient > 75%     Toileting Toileting Toileting Activity did not occur (Clothing management and hygiene only): N/A (no void or bm)  Toileting assist Assist for toileting: Moderate  Assistance - Patient 50 - 74%     Transfers Chair/bed transfer  Transfers assist     Chair/bed transfer assist level: Minimal Assistance - Patient > 75%     Locomotion Ambulation   Ambulation assist      Assist level: Minimal Assistance - Patient > 75% Assistive device: Walker-rolling Max distance: 25'   Walk 10 feet activity   Assist     Assist level: Minimal Assistance - Patient > 75% Assistive device: Walker-rolling   Walk 50 feet activity   Assist Walk 50 feet with 2 turns activity did not occur: Safety/medical concerns         Walk 150 feet activity   Assist Walk 150 feet activity did not occur: Safety/medical concerns         Walk 10 feet on uneven surface  activity   Assist Walk 10 feet on uneven surfaces activity did not occur: Safety/medical concerns         Wheelchair     Assist Is the patient using a wheelchair?: Yes Type of Wheelchair: Manual    Wheelchair assist level: Dependent - Patient 0%      Wheelchair 50 feet with 2 turns activity    Assist        Assist Level: Dependent - Patient 0%   Wheelchair 150 feet activity     Assist      Assist Level: Dependent - Patient 0%   Blood pressure 128/67, pulse 91, temperature 98.3 F (36.8 C), resp. rate 15, weight 68.2 kg, SpO2 96 %.  Medical Problem List and Plan: 1. Functional deficits secondary to fall sustaining right superior inferior pubic ramus fracture/pelvic hematoma.  Status postembolization of right pectineal branch artery.  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  Weightbearing as tolerated.             -patient may  shower             -ELOS/Goals: 7-10d , sup/mod I  12/20- Con't CIR- PT and OT-  2.  Antithrombotics: -DVT/anticoagulation:  Mechanical: Antiembolism stockings, thigh (TED hose) Bilateral lower extremities             -antiplatelet therapy: Resume Plavix 75 mg daily 10/30/2021 3. Pain  Management: Hydrocodone as needed,  took 2 this morning prior to therapy.   12/19 alert and appropriate this morning   -add daily dose of oxycontin 10mg  at 0700--watch MS closely  12/20- alert; appropriate; said pain 0/10 this AM- recommended taking prior to therapy.  4. Mood: Aricept 5 mg daily, Wellbutrin 150 mg daily, 300 mg daily, Namenda 10 mg twice daily, Ativan every 8 hours as needed             -antipsychotic agents: N/A 5. Neuropsych: This patient is capable of making decisions on his own behalf.  Mild dementia, forgetful but can make decisions with assistance 6. Skin/Wound Care: Routine skin checks 7. Fluids/Electrolytes/Nutrition: encourage PO, hd been losing weight PTA  -protein supp for low albumin 8.  Acute blood loss anemia.  hgb 11.7 12/19 9.  Hyperlipidemia.  Lipitor 10.  Chronic combined systolic diastolic congestive heart failure.  Monitor for any signs of fluid overload   Filed Weights   10/25/21 0641 10/26/21 0500 10/27/21 0530  Weight: 73.1 kg 74 kg 68.2 kg    12/20- weight dropped 6 kg- which is unlikely- will having nursing recheck 11.  History of left bundle branch block.  Status post pacemaker 2013.  Follow-up outpatient 12.  Type 2 diabetes mellitus.  Hemoglobin A1c 6.7.  SSI.  Patient on Synjardy XR 2 tabs daily prior to admission.   CBG (last 3)  Recent Labs    10/26/21 1202 10/26/21 1659 10/26/21 2138  GLUCAP 142* 181* 140*   12/19 sub-optimal control. Resume only metformin from synjardy for now, 250mg  bid  12/20- CBGs looking a little better- con't regimen and monitor  13.  Urinary retention/BPH.  Urinary tension possibly from pelvic hematoma.  Foley catheter tube removed.    -continue voiding trial   -has required 2 I/O caths for 500cc, bloody urine  -begin flomax 0.4mg  qpm  12/20- pt said is voiding since last night- last cath at 4:50 pm yesterday-  14. Constipation  12/20- will give sorbitol after therapy- has been 3 days.   LOS: 3 days A FACE  TO FACE EVALUATION WAS PERFORMED  Guiliana Shor 10/27/2021, 9:54 AM

## 2021-10-27 NOTE — Progress Notes (Signed)
Occupational Therapy Session Note  Patient Details  Name: Kevin [name] MRN: [id number] Date of Birth: 07/21/1946  Today's Date: 10/27/2021 OT Individual Time: 0830-0930 OT Individual Time Calculation (min): 60 min    Short Term Goals: Week 1:  OT Short Term Goal 1 (Week 1): Pt will complete 1/3 components of toileting with CGA for balance OT Short Term Goal 2 (Week 1): Pt will complete LB self care at sit<stand level with no more than CGA for balance assistance OT Short Term Goal 3 (Week 1): Pt will thread 1 LE into pants with supervision using AE as needed  Skilled Therapeutic Interventions/Progress Updates:    Pt received in bed, no pain at rest.  Agreeable to working on b/d. Sat to EOB with S then immediately stated he felt dizzy.  Had pt rest and drink water and then able to complete all UB self care with S.  Continued to c/o dizziness. Had pt lie down. Only S today to get legs into bed. BP 122/72. After 5 min with HOB elevated in bed, pt stated he still felt he was "rocking" and that he "sees things"  like the walker legs changing colors.  Had pt do some basic visual tracking and he reports intermittent diplopia.  Upon sitting to EOB again, pt said dizziness much worse. BP 101/71. After 2 min 102/61.  Pt kept saying he felt like he was about to all over so had pt return to supine for LB self care.  Used rolling and bridging within his movement tolerance to doff pants, wash, don pants. Pt agreeable to sitting up again. Sat to EOB with S and then stood from elevated bed to RW with min A. Tolerated standing 3 minutes. BP 116/71.  He did not c/o increased dizziness in standing. Pt then able to sit and move back to supine with S.   Resting in bed with HOB elevated. He said he felt sinus pressure in head and continues to feel like he is rocking but more relaxed than before.   All needs met, alarm set.    Therapy Documentation Precautions:  Precautions Precautions: Fall Precaution  Comments: pacemaker Restrictions Weight Bearing Restrictions: No RLE Weight Bearing: Weight bearing as tolerated    Vital Signs: Therapy Vitals Temp: 98.3 F (36.8 C) Pulse Rate: 91 Resp: 15 BP: 128/67 Patient Position (if appropriate): Lying Oxygen Therapy SpO2: 96 % O2 Device: Room Air Pain: Pain Assessment Pain Scale: 0-10 Pain Score: 8  Pain Type: Acute pain Pain Location: Pelvis Pain Orientation: Right;Left Pain Descriptors / Indicators: Aching Pain Frequency: Constant Pain Onset: On-going Patients Stated Pain Goal: 3 Pain Intervention(s): Medication (See eMAR) ADL: ADL Eating: Not assessed Grooming: Setup Where Assessed-Grooming: Edge of bed Upper Body Bathing: Supervision/safety Where Assessed-Upper Body Bathing: Edge of bed Lower Body Bathing: Minimal assistance Where Assessed-Lower Body Bathing: Edge of bed Upper Body Dressing: Supervision/safety Where Assessed-Upper Body Dressing: Edge of bed Lower Body Dressing: Moderate assistance Where Assessed-Lower Body Dressing: Edge of bed   Therapy/Group: Individual Therapy  Kawela Bay 10/27/2021, 8:22 AM

## 2021-10-28 DIAGNOSIS — E440 Moderate protein-calorie malnutrition: Secondary | ICD-10-CM | POA: Insufficient documentation

## 2021-10-28 LAB — GLUCOSE, CAPILLARY
Glucose-Capillary: 159 mg/dL — ABNORMAL HIGH (ref 70–99)
Glucose-Capillary: 161 mg/dL — ABNORMAL HIGH (ref 70–99)
Glucose-Capillary: 137 mg/dL — ABNORMAL HIGH (ref 70–99)
Glucose-Capillary: 129 mg/dL — ABNORMAL HIGH (ref 70–99)

## 2021-10-28 LAB — URINALYSIS, ROUTINE W REFLEX MICROSCOPIC
Bacteria, UA: NONE SEEN
Bilirubin Urine: NEGATIVE
Glucose, UA: NEGATIVE mg/dL
Ketones, ur: NEGATIVE mg/dL
Leukocytes,Ua: NEGATIVE
Nitrite: NEGATIVE
Protein, ur: NEGATIVE mg/dL
RBC / HPF: >50 RBC/hpf — ABNORMAL HIGH (ref 0–5)
Specific Gravity, Urine: 1.016 (ref 1.005–1.030)
pH: 5 (ref 5.0–8.0)

## 2021-10-28 MED ORDER — BETHANECHOL CHLORIDE 10 MG PO TABS
10.0000 mg | ORAL_TABLET | Freq: Three times a day (TID) | ORAL | Status: DC
  Administered 2021-10-28 – 2021-10-29 (×3): 10 mg via ORAL
  Filled 2021-10-28 (×3): qty 1

## 2021-10-28 MED ORDER — SORBITOL 70 % SOLN
30.0000 mL | Freq: Once | Status: AC
  Administered 2021-10-28: 14:00:00 30 mL via ORAL
  Filled 2021-10-28: qty 30

## 2021-10-28 NOTE — Progress Notes (Signed)
PROGRESS NOTE   Subjective/Complaints:  Pt reports doesn't know where he was and why he was here-  Did remember he fell, but explained had pelvic fx and in rehab- explained needs to work with therapy to get home- d/c date 12/27.   Pt stil having issues with urinary retention- will check for UTI. Is allergic to Sulfa- cannot use Flomax.   ROS:    Pt denies SOB, abd pain, CP, N/V/C/D, and vision changes     Objective:   No results found. Recent Labs    10/26/21 0601  WBC 8.1  HGB 11.7*  HCT 36.0*  PLT 158   Recent Labs    10/26/21 0601  NA 136  K 4.3  CL 101  CO2 27  GLUCOSE 131*  BUN 20  CREATININE 1.09  CALCIUM 9.0    Intake/Output Summary (Last 24 hours) at 10/28/2021 1448 Last data filed at 10/28/2021 0200 Gross per 24 hour  Intake 480 ml  Output 850 ml  Net -370 ml        Physical Exam: Vital Signs Blood pressure 128/71, pulse 86, temperature 98 F (36.7 C), resp. rate 17, weight 72.8 kg, SpO2 94 %.     General: awake, alert, appropriate, sitting up in bed; breakfast at bedside- not eaten yet; NAD HENT: conjugate gaze; oropharynx moist CV: regular rate; no JVD Pulmonary: CTA B/L; no W/R/R- good air movement GI: soft, NT, ND, (+)BS Psychiatric: appropriate; calm Neurological: Ox1- had to be reminded why here and where he was- Ext: no clubbing, cyanosis, or edema Psych: pleasant and cooperative  Skin: No evidence of breakdown, no evidence of rash.  A few bruises right arm,hip Neurologic: Cranial nerves II through XII intact, motor strength is 5/5 in bilateral deltoid, bicep, tricep, grip.  3- to 3/5 RIght lower ext prox to 4/5 distal, 5/5 Left hip flexor, knee extensors, ankle dorsiflexor and plantar flexor Sensory exam normal sensation to light touch and proprioception in bilateral upper and lower extremities Cerebellar exam normal finger to nose to finger as well as heel to shin in  bilateral upper and lower extremities Musculoskeletal: Full range of motion in all 4 extremities. No joint swelling   Assessment/Plan: 1. Functional deficits which require 3+ hours per day of interdisciplinary therapy in a comprehensive inpatient rehab setting. Physiatrist is providing close team supervision and 24 hour management of active medical problems listed below. Physiatrist and rehab team continue to assess barriers to discharge/monitor patient progress toward functional and medical goals  Care Tool:  Bathing    Body parts bathed by patient: Right arm, Left arm, Chest, Abdomen, Front perineal area, Buttocks, Right upper leg, Left upper leg, Face   Body parts bathed by helper: Right lower leg, Left lower leg     Bathing assist Assist Level: Minimal Assistance - Patient > 75%     Upper Body Dressing/Undressing Upper body dressing   What is the patient wearing?: Pull over shirt    Upper body assist Assist Level: Set up assist    Lower Body Dressing/Undressing Lower body dressing      What is the patient wearing?: Pants, Incontinence brief     Lower body assist  Assist for lower body dressing: Minimal Assistance - Patient > 75%     Toileting Toileting Toileting Activity did not occur (Clothing management and hygiene only): N/A (no void or bm)  Toileting assist Assist for toileting: Moderate Assistance - Patient 50 - 74%     Transfers Chair/bed transfer  Transfers assist     Chair/bed transfer assist level: Minimal Assistance - Patient > 75%     Locomotion Ambulation   Ambulation assist      Assist level: Minimal Assistance - Patient > 75% Assistive device: Walker-rolling Max distance: 70'   Walk 10 feet activity   Assist     Assist level: Minimal Assistance - Patient > 75% Assistive device: Walker-rolling   Walk 50 feet activity   Assist Walk 50 feet with 2 turns activity did not occur: Safety/medical concerns  Assist level: Minimal  Assistance - Patient > 75% Assistive device: Walker-rolling    Walk 150 feet activity   Assist Walk 150 feet activity did not occur: Safety/medical concerns         Walk 10 feet on uneven surface  activity   Assist Walk 10 feet on uneven surfaces activity did not occur: Safety/medical concerns         Wheelchair     Assist Is the patient using a wheelchair?: Yes Type of Wheelchair: Manual    Wheelchair assist level: Dependent - Patient 0%      Wheelchair 50 feet with 2 turns activity    Assist        Assist Level: Dependent - Patient 0%   Wheelchair 150 feet activity     Assist      Assist Level: Dependent - Patient 0%   Blood pressure 128/71, pulse 86, temperature 98 F (36.7 C), resp. rate 17, weight 72.8 kg, SpO2 94 %.  Medical Problem List and Plan: 1. Functional deficits secondary to fall sustaining right superior inferior pubic ramus fracture/pelvic hematoma.  Status postembolization of right pectineal branch artery.  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  Weightbearing as tolerated.             -patient may  shower             -ELOS/Goals: 7-10d , sup/mod I  12/21- d/c 12/27- con't CIR PT and OT- think confusion is medication related vs UTI- will check for UTI-  2.  Antithrombotics: -DVT/anticoagulation:  Mechanical: Antiembolism stockings, thigh (TED hose) Bilateral lower extremities             -antiplatelet therapy: Resume Plavix 75 mg daily 10/30/2021 3. Pain Management: Hydrocodone as needed, took 2 this morning prior to therapy.   12/19 alert and appropriate this morning   -add daily dose of oxycontin 10mg  at 0700--watch MS closely  12/20- alert; appropriate; said pain 0/10 this AM- recommended taking prior to therapy.   12/21- will stop OxyContin because more confused- already got this AM 4. Mood: Aricept 5 mg daily, Wellbutrin 150 mg daily, 300 mg daily, Namenda 10 mg twice daily, Ativan  every 8 hours as needed             -antipsychotic agents: N/A 5. Neuropsych: This patient is capable of making decisions on his own behalf.  Mild dementia, forgetful but can make decisions with assistance 12/21- more confused this AM- needs reorientation- asked nursing to place sign to remind him where/why here 6. Skin/Wound Care: Routine skin checks 7. Fluids/Electrolytes/Nutrition: encourage PO, hd been losing weight PTA  -  protein supp for low albumin 8.  Acute blood loss anemia.  hgb 11.7 12/19 9.  Hyperlipidemia.  Lipitor 10.  Chronic combined systolic diastolic congestive heart failure.  Monitor for any signs of fluid overload   Filed Weights   10/26/21 0500 10/27/21 0530 10/28/21 0310  Weight: 74 kg 68.2 kg 72.8 kg    12/20- weight dropped 6 kg- which is unlikely- will having nursing recheck  12/21- Weight back to baseline- con't to monitor 11.  History of left bundle branch block.  Status post pacemaker 2013.  Follow-up outpatient 12.  Type 2 diabetes mellitus.  Hemoglobin A1c 6.7.  SSI.  Patient on Synjardy XR 2 tabs daily prior to admission.   CBG (last 3)  Recent Labs    10/27/21 1621 10/27/21 2127 10/28/21 0622  GLUCAP 186* 170* 159*   12/19 sub-optimal control. Resume only metformin from synjardy for now, 250mg  bid  12/20- CBGs looking a little better- con't regimen and monitor  13.  Urinary retention/BPH.  Urinary tension possibly from pelvic hematoma.  Foley catheter tube removed.    -continue voiding trial   -has required 2 I/O caths for 500cc, bloody urine  -begin flomax 0.4mg  qpm  12/20- pt said is voiding since last night- last cath at 4:50 pm yesterday-   12/21- needs another in/out cath- will check U/A and Cx- cannot use Flomax due to Sulfa allergy- swelling of lips. Will see if (+) UTI- if not, will start Urecholine? 14. Constipation  12/20- will give sorbitol after therapy- has been 3 days.   12/21- will reorder and try again.  15. Confusion-  worsening  12/21- will check U/A and Cx to see if getting UTI- could explain worsening confusion.   LOS: 4 days A FACE TO FACE EVALUATION WAS PERFORMED  Iliyah Bui 10/28/2021, 8:38 AM

## 2021-10-28 NOTE — Progress Notes (Signed)
Physical Therapy Session Note  Patient Details  Name: Kevin [name] MRN: [id number] Date of Birth: 1946/01/28  Today's Date: 10/28/2021 PT Individual Time: 4098-1191 PT Individual Time Calculation (min): 62 min   Short Term Goals: Week 1:  PT Short Term Goal 1 (Week 1): Pt will perform bed mobility with supervision PT Short Term Goal 2 (Week 1): Pt will perform transfers with CGA PT Short Term Goal 3 (Week 1): Pt will gait x 50' with CGA PT Short Term Goal 4 (Week 1): Pt will perform stair negotiation with min assist  Skilled Therapeutic Interventions/Progress Updates:    Pt received long sitting in the bed, HOB elevated, with his wife present and pt stating he is doing "horrible" reporting "I just threw up." [name] confirmed and pt politely requests therapist return at a later time. Pt reports no additional needs at this time - left in bed with needs in reach and bed alarm on. RN aware of pt's emesis and reports it is likely due to recent medication administration and pt's lack of BM. Therapist planning to return as scheduling allows.    Therapist spoke with RN to ensure pt feeling better and she reports pt is now experiencing strong urgency to urinate and they have attempted; however, pt has been unable to void. Therapist returned to pt's room and he was long sitting in bed, HOB elevated, confirming he is feeling a strong urgency to urinate. With encouragement, pt agreeable to therapy session and attempt at voiding on toilet. Supine>sitting L EOB, HOB elevated and using bedrails, with supervision - pt requires increased time and effort to bring R LE over to EOB.   Sit>stand from EOB with cuing to push up from bed as pt initially goes to pull up on RW with light min assist for rising to stand. Gait ~12ft into bathroom using RW with light min assist - demos significant antalgic gait pattern, off-weighting R LE by pushing down through B UEs on RW. Standing with light min assist for  balance while completed LB clothing management. Pt unable to void despite time on toilet with water running.  With encouragement pt agreeable to continue with therapy session and transported to gym.  Sit<>stand w/c<>RW with min assist for rising to stand - slow to transition hands from w/c armrest to RW handles with delayed trunk/hip extension and slight unsteadiness noted.  Gait training ~65ft using RW with light min assist - continues to demo significant antalgic gait pattern with pt using BUE support on RW to off-weight R LE during stance - varies from step-to pattern leading with R LE to slight reciprocal pattern.  Pt reporting increased pain in R hip after ambulating.  Standing R LE strengthening and dynamic standing balance with B UE support on RW to complete the below tasks  - forward foot taps on 8" step 2x10 reps - lateral foot taps to yoga block targeting hip abductor strengthening 2x10reps  Requires heavy min assist for balance throughout due to progressively worsening L posterior lean and despite cuing to improve pt unable to motor plan balance recovery.   Pt starts to report increased dizziness towards end of session. Transported back to room and assessed vitals: BP 134/84 (MAP 100), HR 100bpm   Pt requests to return to bed. Sit>stand w/c>RW with CGA/light min assist. Gait ~90ft to EOB using RW with light min assist. Sit>supine, HOB elevated and using bedrail with supervision. Pt left supine in bed with needs in reach and bed alarm on.  Therapy Documentation Precautions:  Precautions Precautions: Fall Precaution Comments: pacemaker Restrictions Weight Bearing Restrictions: No RLE Weight Bearing: Weight bearing as tolerated   Pain: Reports "a little" pain after ambulation - modified interventions to decrease R LE WBing for pain management as well as provided pt with seated rest breaks and distraction.    Therapy/Group: Individual Therapy  Tawana Scale , PT, DPT,  NCS, CSRS  10/28/2021, 12:24 PM

## 2021-10-28 NOTE — Progress Notes (Signed)
Reviewed UA: negative leukocytes, nitrites; clear and yellow; normal SG +hgb and RBCs as prior Start Urecholine 10 mg TID

## 2021-10-28 NOTE — Progress Notes (Signed)
Occupational Therapy Session Note ° °Patient Details  °Name: Kevin [name] °MRN: [id number] °Date of Birth: 10/14/1946 ° °Today's Date: 10/28/2021 °OT Individual Time: 0950-1100 °OT Individual Time Calculation (min): 70 min  ° ° °Short Term Goals: °Week 1:  OT Short Term Goal 1 (Week 1): Pt will complete 1/3 components of toileting with CGA for balance °OT Short Term Goal 2 (Week 1): Pt will complete LB self care at sit<stand level with no more than CGA for balance assistance °OT Short Term Goal 3 (Week 1): Pt will thread 1 LE into pants with supervision using AE as needed ° °Skilled Therapeutic Interventions/Progress Updates:  °  Pt received in hand off from PT. Pt stated he felt less dizzy today, so he was agreeable to a shower.  Had pt complete a stand pivot to BSC over toilet (for height) using grab bar with CGA. He was able to manage clothing off his body with light A.  Pt sat for several minutes attempting to void, but no void.  Using RW, pt stood to ambulate 4 feet to shower bench. Did need cues with how to turn around to step back into shower.  In shower, pt did quite well physically as he was able to reach his feet and use lateral leans to wash bottom. Do not advocate pt to stand as he has frequent challenges with feeling dizzy.  He does need frequent cues with problem solving such as water temp adjustment, placing HH shower back on fixture. I do feel that pt will need close S at home.   °Pt then dried off and did a stand pivot to wc.  From chair, he dried off and dressed. He placed his feet in his loose boxers without difficulty but had more difficulty with paper scrub pants (no other clothing available).  Pt was able to consistently sit to stand to his RW with only CGA. He did well using both hands to push up from w/c but continues to need cues to reach back when moving to sit.  Pt ambulated to bed (6 ft) with CGA.  From standing at EOB, pt worked on standing exercises of alternating heel lifts, B heel  lifts. Pt was having difficulty with motor planning and kept trying to bend his knees.  Pt began to feel dizzier, so had pt lie down in bed.   °Pt then worked on UE AROM exercises and resisted shoulder presses using his belt. Pt in bed with all needs met, alarm set.  ° °Therapy Documentation °Precautions:  °Precautions °Precautions: Fall °Precaution Comments: pacemaker °Restrictions °Weight Bearing Restrictions: No °RLE Weight Bearing: Weight bearing as tolerated °  °Pain: °Pain Assessment °Pain Scale: 0-10 °Pain Score: 0-No pain °ADL: °ADL °Eating: Independent °Grooming: Setup °Where Assessed-Grooming: Edge of bed °Upper Body Bathing: Supervision/safety °Where Assessed-Upper Body Bathing: Shower °Lower Body Bathing: Supervision/safety °Where Assessed-Lower Body Bathing: Shower °Upper Body Dressing: Independent °Where Assessed-Upper Body Dressing: Chair °Lower Body Dressing: Minimal assistance °Where Assessed-Lower Body Dressing: Chair °Toileting: Minimal assistance °Where Assessed-Toileting: Toilet °Toilet Transfer: Contact guard °Toilet Transfer Method: Stand pivot °Toilet Transfer Equipment: Raised toilet seat, Grab bars °Tub/Shower Transfer: Not assessed °Walk-In Shower Transfer: Contact guard °Walk-In Shower Transfer Method: Ambulating, Stand pivot °Walk-In Shower Equipment: Transfer tub bench, Grab bars ° ° °Therapy/Group: Individual Therapy ° °, °10/28/2021, 11:44 AM °

## 2021-10-28 NOTE — Progress Notes (Signed)
Physical Therapy Session Note  Patient Details  Name: Kevin [name] MRN: [id number] Date of Birth: 10-08-1946  Today's Date: 10/28/2021 PT Individual Time: 0900-0953 PT Individual Time Calculation (min): 53 min   Short Term Goals: Week 1:  PT Short Term Goal 1 (Week 1): Pt will perform bed mobility with supervision PT Short Term Goal 2 (Week 1): Pt will perform transfers with CGA PT Short Term Goal 3 (Week 1): Pt will gait x 50' with CGA PT Short Term Goal 4 (Week 1): Pt will perform stair negotiation with min assist  Skilled Therapeutic Interventions/Progress Updates: Pt presented in bed with Jessical, NT present agreeable to therapy. NT attempting to obtain urine sample and pt agreeable. Pt performed supine to sit with CGA and use of bed features. Pt performed Sit to stand from EOB with minA (close to [place]). Pt attempted to void in standing and was able to stand for several minutes with CGA however unable to void. Pt returned to sitting with water running in background for a couple of minutes with pt standing again in same manner as prior and attempted to void again unsuccessfully. While in sanding pt attempted to pull pants over hips requiring minA. Pt then ambulated to w/c ~41f with CGA and transported to day room for energy conservation. Pt participated in ambulation for endurace ~656fand 753fith RW and CGA and extended seated rest break between bouts for pain management. Pt noted to initially ambulate with head down, kyphotic posture, narrow BOS, and antalgic gait. With increased distance pt demonstrating improved step length, improved posture, and demonstrating fair safety with RW. Pt then participated in Cybex Kinetron 50cm/sec 2 x 2mi53mouts for posterior chair strengthening and endurance. [place] current needs met.      Therapy Documentation Precautions:  Precautions Precautions: Fall Precaution Comments:  pacemaker Restrictions Weight Bearing Restrictions: No RLE Weight Bearing: Weight bearing as tolerated General:   Vital Signs: Therapy Vitals Temp: 98.2 F (36.8 C) Temp Source: Oral Pulse Rate: 90 Resp: 18 BP: 129/64 Patient Position (if appropriate): Sitting Oxygen Therapy SpO2: 92 % O2 Device: Room Air Pain: Pain Assessment Pain Score: 5  Mobility:   Locomotion :    Trunk/Postural Assessment :    Balance:   Exercises:   Other Treatments:      Therapy/Group: Individual Therapy  Devetta Hagenow 10/28/2021, 4:28 PM

## 2021-10-29 LAB — GLUCOSE, CAPILLARY
Glucose-Capillary: 145 mg/dL — ABNORMAL HIGH (ref 70–99)
Glucose-Capillary: 105 mg/dL — ABNORMAL HIGH (ref 70–99)
Glucose-Capillary: 138 mg/dL — ABNORMAL HIGH (ref 70–99)
Glucose-Capillary: 122 mg/dL — ABNORMAL HIGH (ref 70–99)

## 2021-10-29 LAB — URINE CULTURE: Culture: NO GROWTH

## 2021-10-29 MED ORDER — BETHANECHOL CHLORIDE 25 MG PO TABS
25.0000 mg | ORAL_TABLET | Freq: Three times a day (TID) | ORAL | Status: DC
  Administered 2021-10-29 – 2021-10-30 (×3): 25 mg via ORAL
  Filled 2021-10-29 (×3): qty 1

## 2021-10-29 NOTE — Progress Notes (Signed)
Physical Therapy Session Note  Patient Details  Name: Kevin [name] MRN: [id number] Date of Birth: Jun 26, 1946  Today's Date: 10/29/2021 PT Individual Time: 3235-5732 PT Individual Time Calculation (min): 42 min   Short Term Goals: Week 1:  PT Short Term Goal 1 (Week 1): Pt will perform bed mobility with supervision PT Short Term Goal 2 (Week 1): Pt will perform transfers with CGA PT Short Term Goal 3 (Week 1): Pt will gait x 50' with CGA PT Short Term Goal 4 (Week 1): Pt will perform stair negotiation with min assist  Skilled Therapeutic Interventions/Progress Updates:  Pt received supine in bed, wife present, denied pain at rest. Emphasis of session on sit <>stand transfers and gait training. Pt reported feeling urge to void, performed supine <>sit w/S* and sit <>stand w/min A for anterior weight shift and management of AD due to poor hand placement. Pt doffed pants w/S* and attempted to void x3 min in standing w/CGA and BUE support on RW. Pt unable to void, reported 8/10 pain in RLE and requested pain meds. Nursing notified. Pt donned pants w/S* and performed stand <>sit w/CGA. Pt requested therapist leave and come back in 30 min three separate times throughout session, informed pt he could not defer his therapy time due to tight scheduling. Pt proceeded to attempt to get into bed and required max encouragement from therapist and wife to participate.   Noted pt pulls himself up via RW when coming to stand, educated pt on proper hand placement and anterior weight shifting using NDT principles. Pt able to teach back and demonstrate sit <>stand w/CGA to RW. Pt ambulated 72' w/RW and CGA, noted significant kyphotic posture, downward gaze, decreased step clearance bilaterally, and antalgic gait pattern. Mod verbal and tactile cues provided for upright posture and forward gaze. Pt reported significant dizziness which spiked when looking down, reminded to maintain forward gaze when walking to  reduce dizziness. After short seated rest break, pt ambulated 85' back to room w/RW and CGA, same gait deviations noted as listed above.   Pt requested to try to void again, doffed pants w/S* and attempted to void for 5 minutes but unable to, nursing notified. Pt donned pants w/S* and performed stand <>sit w/CGA. Sit <>supine w/S* and pt was left supine in bed, all needs in reach.   Therapy Documentation Precautions:  Precautions Precautions: Fall Precaution Comments: pacemaker Restrictions Weight Bearing Restrictions: No RLE Weight Bearing: Weight bearing as tolerated   Therapy/Group: Individual Therapy Cruzita Lederer Mychael Smock, PT, DPT  10/29/2021, 9:19 AM

## 2021-10-29 NOTE — Progress Notes (Signed)
Physical Therapy Session Note  Patient Details  Name: Kevin [name] MRN: [id number] Date of Birth: 1945/12/07  Today's Date: 10/29/2021 PT Individual Time: 0802-0902 PT Individual Time Calculation (min): 60 min  and Today's Date: 10/29/2021 PT Missed Time: 30 Minutes Missed Time Reason: Patient fatigue  Short Term Goals: Week 1:  PT Short Term Goal 1 (Week 1): Pt will perform bed mobility with supervision PT Short Term Goal 2 (Week 1): Pt will perform transfers with CGA PT Short Term Goal 3 (Week 1): Pt will gait x 50' with CGA PT Short Term Goal 4 (Week 1): Pt will perform stair negotiation with min assist  Skilled Therapeutic Interventions/Progress Updates:  Pt received supine in bed asleep, easy to arouse. Pt denied pain at rest but reported 9/10 dizziness. After min encouragement, pt agreeable to participate in therapy. Pt donned hearing aids w/set-up assist and supine <>sit EOB w/S*. Pt performed upper body dressing at EOB w/set-up assist and lower body dressing w/min A to assist threading RLE through shorts. Sit <>stand from EOB w/min A 2/2 poor hand placement despite verbal cues. Pt ambulated 66' w/RW and CGA, noted antalgic gait pattern, downward gaze w/kyphotic posture, poor AD management and  maintained knee flexion in stance bilaterally. Min verbal and tactile cues for forward gaze and upright posture, which pt ignored. Stand <>sit to mat w/ min A for eccentric control due to poor hand placement. Pt reported sustained 9/10 dizziness, light encouragement required for pt to continue to participate. Sit <>stand from edge of mat w/CGA due to improved hand placement and pt ambulated 10' to NuStep w/RW and CGA. Pt performed 10 minutes on NuStep level 5 for UE/LE coordination, dynamic cardiovascular warmup and BLE strength. Pt reported decrease in dizziness while performing on NuStep, informed pt he tends to experience decrease in dizziness when he maintains forward gaze, pt verbalized  understanding but continued to assume downward gaze when ambulating. Pt reported urgency to void, sit <>stand pivot from NuStep to [facility] w/min A for stabilization of AD and assistance anterior weight shifting. Pt transported back to room w/total A and performed sit <>stand pivot from WC to EOB w/RW and CGA. Pt able to doff pants/brief independently and performed sit <>stand from elevated bed w/CGA. Pt attempted to void while standing for 5 minutes, but unable to urinate. Pt began to demonstrate posterior lean while standing w/BUE, requiring min A for stabilization. Pt reported being "too tired to do this" and requested to end session. Provided max encouragement to participate at bed level, but pt refused. Sit <>supine w/S* and pt reported 6/10 pain in R pelvis. Pt was left supine in bed, ice pack on R pelvis, all needs in reach. Nursing notified that pt unable to void. Missed 30 minutes of skilled PT due to pt fatigue, will attempt to make up time later in day as schedule allows.    Therapy Documentation Precautions:  Precautions Precautions: Fall Precaution Comments: pacemaker Restrictions Weight Bearing Restrictions: No RLE Weight Bearing: Weight bearing as tolerated   Therapy/Group: Individual Therapy Cruzita Lederer Thedora Rings, PT, DPT  10/29/2021, 7:47 AM

## 2021-10-29 NOTE — Progress Notes (Signed)
Occupational Therapy Session Note  Patient Details  Name: Kevin [name] MRN: [id number] Date of Birth: 03/12/1946  Today's Date: 10/29/2021 OT Individual Time: 0950-1030 OT Individual Time Calculation (min): 40 min    Short Term Goals: Week 1:  OT Short Term Goal 1 (Week 1): Pt will complete 1/3 components of toileting with CGA for balance OT Short Term Goal 2 (Week 1): Pt will complete LB self care at sit<stand level with no more than CGA for balance assistance OT Short Term Goal 3 (Week 1): Pt will thread 1 LE into pants with supervision using AE as needed  Skilled Therapeutic Interventions/Progress Updates:    Pt received in bed and stated that he had to toilet right away.  Pt able to sit to EOB with S,  was dizzy so sat for a minute, stood with CGA then stood for 1 minute before ambulating to bathroom with RW with CGA.   Pt is very tall, so BSC placed over toilet.  Yesterday he c/o it was uncomfortable and today he really c/o discomfort.  "This is the worst thing ever! How in the world can a man sit on this!!".  Because he was so uncomfortable, had pt stand up and I removed the [facility] to allow him to sit on regular toilet.  He is 6'4" so toilet very low and pt continued to say it was "awful". He was able to use grab bars to pull up to stand with CGA to RW.  No void.    Pt ambulated to bed and said he had had enough and was too tired to do more. Used this time to call his wife on his phone to discuss DME.  Phone on speaker so pt was involved in conversation. Explained his difficulty with the toilet and recommended a padded BSC to put over toilet to increase height.  Also recommended shower chair with arms for their walk in shower.  Provided pt/wife with print outs of recommended equipment they can get from [employer] or the [place].   Also recommended to her that Mr Coole have supervision at home when she returns to work.  Pt resting in bed with all needs met and alarm on.   Therapy  Documentation Precautions:  Precautions Precautions: Fall Precaution Comments: pacemaker Restrictions Weight Bearing Restrictions: No RLE Weight Bearing: Weight bearing as tolerated     Pain: Pain Assessment Pain Scale: 0-10 Pain Score: 0-No pain ADL: ADL Eating: Independent Grooming: Setup Where Assessed-Grooming: Edge of bed Upper Body Bathing: Supervision/safety Where Assessed-Upper Body Bathing: Shower Lower Body Bathing: Supervision/safety Where Assessed-Lower Body Bathing: Shower Upper Body Dressing: Independent Where Assessed-Upper Body Dressing: Chair Lower Body Dressing: Minimal assistance Where Assessed-Lower Body Dressing: Chair Toileting: Minimal assistance Where Assessed-Toileting: Glass blower/designer: Therapist, music Method: Arts development officer: Raised toilet seat, Energy manager: Not assessed Social research officer, government: Curator Method: Ambulating, Stand pivot [employer]: Radio broadcast assistant, Grab bars   Therapy/Group: Individual Therapy  [place] 10/29/2021, 12:12 PM

## 2021-10-29 NOTE — Progress Notes (Signed)
PROGRESS NOTE   Subjective/Complaints: C/o being unable to void. Feels tired in the morning. Ongoing right hip/pelvic pain  ROS: Patient denies fever, rash, sore throat, blurred vision, nausea, vomiting, diarrhea, cough, shortness of breath or chest pain, joint or back pain, headache, or mood change.      Objective:   No results found. No results for input(s): WBC, HGB, HCT, PLT in the last 72 hours.  No results for input(s): NA, K, CL, CO2, GLUCOSE, BUN, CREATININE, CALCIUM in the last 72 hours.   Intake/Output Summary (Last 24 hours) at 10/29/2021 1122 Last data filed at 10/29/2021 0500 Gross per 24 hour  Intake 220 ml  Output 1750 ml  Net -1530 ml        Physical Exam: Vital Signs Blood pressure 132/71, pulse 88, temperature 97.7 F (36.5 C), resp. rate 18, height 6\' 4"  (1.93 m), weight 72.8 kg, SpO2 98 %.     Constitutional: No distress . Vital signs reviewed. HEENT: NCAT, EOMI, oral membranes moist Neck: supple Cardiovascular: RRR without murmur. No JVD    Respiratory/Chest: CTA Bilaterally without wheezes or rales. Normal effort    GI/Abdomen: BS +, non-tender, non-distended Ext: no clubbing, cyanosis, or edema Psych: pleasant and cooperative  Skin: No evidence of breakdown, no evidence of rash.  A few bruises right arm,hip present Neurologic: STM deficits. Fairly alert, fair insight. Cranial nerves II through XII intact, motor strength is 5/5 in bilateral deltoid, bicep, tricep, grip.  3- to 3/5 RIght lower ext prox to 4/5 distal, 5/5 Left hip flexor, knee extensors, ankle dorsiflexor and plantar flexor Reasonable standing balance Musculoskeletal: Full range of motion in all 4 extremities. No joint swelling   Assessment/Plan: 1. Functional deficits which require 3+ hours per day of interdisciplinary therapy in a comprehensive inpatient rehab setting. Physiatrist is providing close team supervision and  24 hour management of active medical problems listed below. Physiatrist and rehab team continue to assess barriers to discharge/monitor patient progress toward functional and medical goals  Care Tool:  Bathing    Body parts bathed by patient: Right arm, Left arm, Chest, Abdomen, Front perineal area, Buttocks, Right upper leg, Left upper leg, Face, Right lower leg, Left lower leg   Body parts bathed by helper: Right lower leg, Left lower leg     Bathing assist Assist Level: Supervision/Verbal cueing     Upper Body Dressing/Undressing Upper body dressing   What is the patient wearing?: Pull over shirt    Upper body assist Assist Level: Independent    Lower Body Dressing/Undressing Lower body dressing      What is the patient wearing?: Underwear/pull up, Pants     Lower body assist Assist for lower body dressing: Minimal Assistance - Patient > 75%     Toileting Toileting Toileting Activity did not occur (Clothing management and hygiene only): N/A (no void or bm)  Toileting assist Assist for toileting: Minimal Assistance - Patient > 75%     Transfers Chair/bed transfer  Transfers assist     Chair/bed transfer assist level: Minimal Assistance - Patient > 75% Chair/bed transfer assistive device: Armrests, Programmer, multimedia   Ambulation assist  Assist level: Minimal Assistance - Patient > 75% Assistive device: Walker-rolling Max distance: 67ft   Walk 10 feet activity   Assist     Assist level: Minimal Assistance - Patient > 75% Assistive device: Walker-rolling   Walk 50 feet activity   Assist Walk 50 feet with 2 turns activity did not occur: Safety/medical concerns  Assist level: Minimal Assistance - Patient > 75% Assistive device: Walker-rolling    Walk 150 feet activity   Assist Walk 150 feet activity did not occur: Safety/medical concerns         Walk 10 feet on uneven surface  activity   Assist Walk 10 feet on  uneven surfaces activity did not occur: Safety/medical concerns         Wheelchair     Assist Is the patient using a wheelchair?: Yes Type of Wheelchair: Manual    Wheelchair assist level: Dependent - Patient 0%      Wheelchair 50 feet with 2 turns activity    Assist        Assist Level: Dependent - Patient 0%   Wheelchair 150 feet activity     Assist      Assist Level: Dependent - Patient 0%   Blood pressure 132/71, pulse 88, temperature 97.7 F (36.5 C), resp. rate 18, height 6\' 4"  (1.93 m), weight 72.8 kg, SpO2 98 %.  Medical Problem List and Plan: 1. Functional deficits secondary to fall sustaining right superior inferior pubic ramus fracture/pelvic hematoma.  Status postembolization of right pectineal branch artery.  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  Weightbearing as tolerated.             -patient may  shower             -ELOS/Goals: 12/27 , sup/mod I    2.  Antithrombotics: -DVT/anticoagulation:  Mechanical: Antiembolism stockings, thigh (TED hose) Bilateral lower extremities             -antiplatelet therapy: Resume Plavix 75 mg daily 10/30/2021 3. Pain Management: Hydrocodone as needed, took 2 this morning prior to therapy.   12/22 backed off pain meds due to AMS. Pt is aware that he will have to work thru pain 4. Mood: Aricept 5 mg daily, Wellbutrin 150 mg daily, 300 mg daily, Namenda 10 mg twice daily, Ativan every 8 hours as needed             -antipsychotic agents: N/A 5. Neuropsych: This patient is capable of making decisions on his own behalf.  Mild dementia, forgetful but can make decisions with assistance 12/22 improved today 6. Skin/Wound Care: Routine skin checks 7. Fluids/Electrolytes/Nutrition: encourage PO, hd been losing weight PTA  -protein supp for low albumin 8.  Acute blood loss anemia.  hgb 11.7 12/19 9.  Hyperlipidemia.  Lipitor 10.  Chronic combined systolic diastolic congestive  heart failure.  Monitor for any signs of fluid overload   Filed Weights   10/26/21 0500 10/27/21 0530 10/28/21 0310  Weight: 74 kg 68.2 kg 72.8 kg    12/20- weight dropped 6 kg- which is unlikely- will having nursing recheck  12/22- Weights between 68 and 74kt. 11.  History of left bundle branch block.  Status post pacemaker 2013.  Follow-up outpatient 12.  Type 2 diabetes mellitus.  Hemoglobin A1c 6.7.  SSI.  Patient on Synjardy XR 2 tabs daily prior to admission.   CBG (last 3)  Recent Labs    10/28/21 1630 10/28/21 2212 10/29/21 [place]  137* 129* 145*   12/19 sub-optimal control. Resume only metformin from synjardy for now, 250mg  bid  12/20- CBGs looking a little better- con't regimen and monitor  13.  Urinary retention/BPH.  Urinary tension possibly from pelvic hematoma.  Foley catheter tube removed.    -continue voiding trial   -has required 2 I/O caths for 500cc, bloody urine  -begin flomax 0.4mg  qpm  12/22 ucx negative, increase urecholine to 25mg  tid 14. Constipation  12/20- will give sorbitol after therapy- has been 3 days.   12/21- will reorder and try again.  15. Confusion- worsening  12/21- will check U/A and Cx to see if getting UTI- could explain worsening confusion.   LOS: 5 days A FACE TO FACE EVALUATION WAS PERFORMED  Meredith Staggers 10/29/2021, 11:22 AM

## 2021-10-30 LAB — GLUCOSE, CAPILLARY
Glucose-Capillary: 131 mg/dL — ABNORMAL HIGH (ref 70–99)
Glucose-Capillary: 148 mg/dL — ABNORMAL HIGH (ref 70–99)
Glucose-Capillary: 159 mg/dL — ABNORMAL HIGH (ref 70–99)
Glucose-Capillary: 113 mg/dL — ABNORMAL HIGH (ref 70–99)

## 2021-10-30 MED ORDER — BETHANECHOL CHLORIDE 25 MG PO TABS
25.0000 mg | ORAL_TABLET | Freq: Four times a day (QID) | ORAL | Status: DC
  Administered 2021-10-30 – 2021-11-02 (×11): 25 mg via ORAL
  Filled 2021-10-30 (×11): qty 1

## 2021-10-30 MED ORDER — SORBITOL 70 % SOLN
60.0000 mL | Status: AC
  Administered 2021-10-30: 13:00:00 60 mL via ORAL
  Filled 2021-10-30: qty 60

## 2021-10-30 NOTE — Progress Notes (Signed)
Patient ID: Kevin [name], male   DOB: 08-30-1946, 75 y.o.   MRN: [id number] Have made referral to [facility] for follow up [place] has equipment needs. Wife aware husband will need 24/7 supervision at discharge.

## 2021-10-30 NOTE — Progress Notes (Signed)
Physical Therapy Session Note  Patient Details  Name: Kevin [name] MRN: [id number] Date of Birth: Jul 28, 1946  Today's Date: 10/30/2021 PT Individual Time: 3500-9381 PT Individual Time Calculation (min): 40 min  and Today's Date: 10/30/2021 PT Missed Time: 20 Minutes Missed Time Reason: Patient unwilling to participate;Patient fatigue  Short Term Goals: Week 1:  PT Short Term Goal 1 (Week 1): Pt will perform bed mobility with supervision PT Short Term Goal 2 (Week 1): Pt will perform transfers with CGA PT Short Term Goal 3 (Week 1): Pt will gait x 50' with CGA PT Short Term Goal 4 (Week 1): Pt will perform stair negotiation with min assist  Skilled Therapeutic Interventions/Progress Updates:  Pt received supine in bed, wife present. Pt reported pain throughout session that was very inconsistent. Initially reported 9/10 pain in head, followed by no pain in head and 6/10 pain in buttocks, followed by denying pain altogether. Pt declined pain meds, offered repositioning and distraction throughout session for pain modulation. Emphasis of session on improved activity tolerance, pressure relief and gait training. After max encouragement from wife and therapist, pt agreed to participate. Pt performed supine <>sit w/S* slowly and sit <>stand from EOB w/min A due to poor hand placement on RW. Pt requested to attempt to have BM and ambulated 15' to toilet w/RW and CGA. Pt doffed pants w/S* and performed stand <>sit on toilet w/min A for eccentric control. Pt hyperverbal about his loathing for toilet while attempting to have BM, repeatedly telling therapist that the seat was "killing him" and the "hospital should be ashamed of themselves" etc., ultimately he was unable to have BM 2/2 discomfort. Sit <>stand from toilet w/CGA via pulling up on grab bars and pt donned pants w/set-up assist. Pt ambulated 15' w/RW and CGA to [facility] and performed hand hygiene at sink w/S*. Meanwhile, nursing able to switch out  pt's bed for an alternating pressure mattress for improved pressure relief. Pt requested to get back into bed, max encouragement to participate due to pt lying in bed all day and refusing OT earlier, which he did not recall.   Sit <>stand from [facility] w/min A due to poor hand placement on RW and pt ambulated 52' and 25' w/RW and CGA. Noted narrow BOS, scissoring of LLE, kyphotic posture and overreliance on BUEs. Mod verbal cues for upright posture, which pt replied "I am going to throw up". Had pt sit and checked vitals, BP at 136/91 mmHg and HR at 109 bpm. Pt pallor in appearance and sweaty, notified nursing to check blood glucose, which was WNL. Pt transported back to room w/total A and performed sit <>stand pivot from WC to EOB w/RW and CGA. Pt performed bed mobility w/S* and immediately reported discomfort in bed. Mod verbal cues for repositioning techniques which aggravated pt, stating "do it for me". Assisted pt in repositioning and he requested to be cathed, nursing notified. Pt was left supine in bed, all needs in reach.   *Of note, wife reported that pt's cognition has declined significantly over past week and suspects he has had a stroke due to his speech sounding slurred to her. Nursing notified.   Therapy Documentation Precautions:  Precautions Precautions: Fall Precaution Comments: pacemaker Restrictions Weight Bearing Restrictions: No RLE Weight Bearing: Weight bearing as tolerated   Therapy/Group: Individual Therapy Cruzita Lederer Ikeem Cleckler, PT, DPT  10/30/2021, 3:10 PM

## 2021-10-30 NOTE — Progress Notes (Signed)
Physical Therapy Session Note  Patient Details  Name: Kevin [name] MRN: [id number] Date of Birth: 1946/04/05  Today's Date: 10/30/2021 PT Individual Time: 0802-0856 PT Individual Time Calculation (min): 54 min   Short Term Goals: Week 1:  PT Short Term Goal 1 (Week 1): Pt will perform bed mobility with supervision PT Short Term Goal 2 (Week 1): Pt will perform transfers with CGA PT Short Term Goal 3 (Week 1): Pt will gait x 50' with CGA PT Short Term Goal 4 (Week 1): Pt will perform stair negotiation with min assist  Skilled Therapeutic Interventions/Progress Updates:  Pt received supine in bed, reported 6/10 pain in R pelvis and requested pain meds. Nursing notified, offered repositioning and modalities throughout session for pain modulation. Emphasis of session on gait training, improved activity tolerance and transfers. Pt performed supine <>sit EOB w/S* slowly and donned shoes at EOB w/set-up assist for LLE and mod A for RLE. Sit <>stand from elevated EOB w/min A for AD stabilization due to poor hand placement despite verbal cues, no evidence of recall from previous session. Pt ambulated 100' w/RW and CGA, noted narrow BOS, kyphotic posture, maintained knee flexion bilaterally and over reliance on BUEs. Min verbal cues for upright posture, pt's posture declines significantly as he fatigues and he demonstrates pushing the RW away from him, variable gait pattern as he attempts to catch back up to RW which increases pain in RLE. Stand <>sit w/min A to [facility] for eccentric control due to poor hand placement. Pt reported significant dizziness w/downward gaze, educated pt on maintaining forward gaze for reduced dizziness. Pt verbalized understanding.   Pt transported to ortho gym w/total A for time management and performed sit <>stand from [facility] w/CGA, noted good hand placement. Pt able to perform car transfer w/RW and CGA, demonstrated good technique but poor hand placement. Mod verbal cues for hand  placement which pt ignored. Min A to stabilize RW as pt transferred out of car.   Pt transported back to room w/total A 2/2 fatigue and max encouragement provided to remain seated in [facility], as pt tends to lay in bed all day. Pt initially verbalized agreement but quickly began to request to get into bed. Continued to encourage pt to remain sitting up for improved endurance, posture and pressure relief, which pt agreed to. Pt was left seated in WC in room, all needs in reach.   Therapy Documentation Precautions:  Precautions Precautions: Fall Precaution Comments: pacemaker Restrictions Weight Bearing Restrictions: No RLE Weight Bearing: Weight bearing as tolerated    Therapy/Group: Individual Therapy Cruzita Lederer Tabbitha Janvrin, PT, DPT  10/30/2021, 7:45 AM

## 2021-10-30 NOTE — Progress Notes (Signed)
Inpatient Rehabilitation Care Coordinator Discharge Note   Patient Details  Name: ZAIDEN LUDLUM MRN: [id number] Date of Birth: 06-17-1946   Discharge location: HOME WITH WIFE WHO CAN PROVIDE 24/7 SUPERVISION LEVEL  Length of Stay:  10 DAYS  Discharge activity level: [place]  Home/community participation: ACTIVE  Patient response QM:VHQION Literacy - How often do you need to have someone help you when you read instructions, pamphlets, or other written material from your doctor or pharmacy?: Rarely  Patient response GE:XBMWUX Isolation - How often do you feel lonely or isolated from those around you?: Rarely  Services provided included: MD, RD, PT, OT, RN, CM, Pharmacy, SW  Financial Services:  Charity fundraiser Utilized: [place] offered to/list presented to: PT AND WIFE  Follow-up services arranged:  Home Health, DME, Patient/Family has no preference for HH/DME agencies [place]: [place]. ORDERED ANOTHER TALL ROLLING WALKER PER PT/WIFE'S PREFERENCE,WAITING TO BE DELIVERED SO CAN GO HOME   [facility]ME [facility] [place] [facility]    Patient response to transportation need: Is the patient able to respond to transportation needs?: Yes In the past 12 months, has lack of transportation kept you from medical appointments or from getting medications?: No In the past 12 months, has lack of transportation kept you from meetings, work, or from getting things needed for daily living?: No    Comments (or additional information):WIFE WAS [place]. COMFORTABLE WITH CARE NEEDS AT DC. PT AWARE NO DRIVING UNTIL MD CLEARS HIM TOO  Patient/Family verbalized understanding of follow-up arrangements:  Yes  Individual responsible for coordination of the follow-up plan: [name]-WIFE  [phone]  Confirmed correct DME delivered: Elease Hashimoto 10/30/2021    [place], [place]

## 2021-10-30 NOTE — Progress Notes (Signed)
Occupational Therapy Session Note  Patient Details  Name: Kevin [name] MRN: [id number] Date of Birth: Mar 27, 1946  Today's Date: 10/30/2021 OT Missed Time: 18 Minutes Missed Time Reason: Patient fatigue;Patient unwilling/refused to participate without medical reason   Short Term Goals: Week 1:  OT Short Term Goal 1 (Week 1): Pt will complete 1/3 components of toileting with CGA for balance OT Short Term Goal 2 (Week 1): Pt will complete LB self care at sit<stand level with no more than CGA for balance assistance OT Short Term Goal 3 (Week 1): Pt will thread 1 LE into pants with supervision using AE as needed  Skilled Therapeutic Interventions/Progress Updates:    Pt scheduled for OT at 1015 -1115. Pt received in bed, "I cant do therapy now, I just had a lot of bleeding when I tried to urinate. I am too tired, maybe in an hour"  Rescheduled another pt to see this pt later.  Returned at [employer], pt still in bed "I just tried sitting on toilet and it was terrible, nothing happened. I really am too tired and cant do therapy. Maybe this afternoon.    Will be unable to reschedule OT today.      Therapy Documentation Precautions:  Precautions Precautions: Fall Precaution Comments: pacemaker Restrictions Weight Bearing Restrictions: No RLE Weight Bearing: Weight bearing as tolerated  Pain: Pain Assessment Pain Score: 5  ADL: ADL Eating: Independent Grooming: Setup Where Assessed-Grooming: Edge of bed Upper Body Bathing: Supervision/safety Where Assessed-Upper Body Bathing: Shower Lower Body Bathing: Supervision/safety Where Assessed-Lower Body Bathing: Shower Upper Body Dressing: Independent Where Assessed-Upper Body Dressing: Chair Lower Body Dressing: Minimal assistance Where Assessed-Lower Body Dressing: Chair Toileting: Minimal assistance Where Assessed-Toileting: Glass blower/designer: Therapist, music Method: Arts development officer:  Raised toilet seat, Energy manager: Not assessed Social research officer, government: Curator Method: Ambulating, Stand pivot [employer]: Radio broadcast assistant, Grab bars   Therapy/Group: Individual Therapy  [place] 10/30/2021, 8:25 AM

## 2021-10-30 NOTE — Progress Notes (Signed)
PROGRESS NOTE   Subjective/Complaints: Pt says he feels tired. Still has right buttock pain. Still requiring caths  ROS: Patient denies fever, rash, sore throat, blurred vision, nausea, vomiting, diarrhea, cough, shortness of breath or chest pain,  headache, or mood change.      Objective:   No results found. No results for input(s): WBC, HGB, HCT, PLT in the last 72 hours.  No results for input(s): NA, K, CL, CO2, GLUCOSE, BUN, CREATININE, CALCIUM in the last 72 hours.   Intake/Output Summary (Last 24 hours) at 10/30/2021 1246 Last data filed at 10/30/2021 0930 Gross per 24 hour  Intake 480 ml  Output 2150 ml  Net -1670 ml        Physical Exam: Vital Signs Blood pressure (!) 150/74, pulse 86, temperature 97.6 F (36.4 C), temperature source Oral, resp. rate 18, height 6\' 4"  (1.93 m), weight 74.2 kg, SpO2 95 %.     Constitutional: No distress . Vital signs reviewed. HEENT: NCAT, EOMI, oral membranes moist Neck: supple Cardiovascular: RRR without murmur. No JVD    Respiratory/Chest: CTA Bilaterally without wheezes or rales. Normal effort    GI/Abdomen: BS +, non-tender, non-distended Ext: no clubbing, cyanosis, or edema Psych: pleasant and cooperative  Skin: No evidence of breakdown, no evidence of rash.  A few bruises right arm,hip are present, buttock irritation? Neurologic: STM deficits. Fairly alert, fair insight. Cranial nerves II through XII intact, motor strength is 5/5 in bilateral deltoid, bicep, tricep, grip.  3- to 3/5 RIght lower ext prox to 4/5 distal, 5/5 Left hip flexor, knee extensors, ankle dorsiflexor and plantar flexor Reasonable standing balance Musculoskeletal: Full range of motion in all 4 extremities. No joint swelling   Assessment/Plan: 1. Functional deficits which require 3+ hours per day of interdisciplinary therapy in a comprehensive inpatient rehab setting. Physiatrist is providing  close team supervision and 24 hour management of active medical problems listed below. Physiatrist and rehab team continue to assess barriers to discharge/monitor patient progress toward functional and medical goals  Care Tool:  Bathing    Body parts bathed by patient: Right arm, Left arm, Chest, Abdomen, Front perineal area, Buttocks, Right upper leg, Left upper leg, Face, Right lower leg, Left lower leg   Body parts bathed by helper: Right lower leg, Left lower leg     Bathing assist Assist Level: Supervision/Verbal cueing     Upper Body Dressing/Undressing Upper body dressing   What is the patient wearing?: Pull over shirt    Upper body assist Assist Level: Independent    Lower Body Dressing/Undressing Lower body dressing      What is the patient wearing?: Underwear/pull up, Pants     Lower body assist Assist for lower body dressing: Minimal Assistance - Patient > 75%     Toileting Toileting Toileting Activity did not occur (Clothing management and hygiene only): N/A (no void or bm)  Toileting assist Assist for toileting: Supervision/Verbal cueing     Transfers Chair/bed transfer  Transfers assist     Chair/bed transfer assist level: Contact Guard/Touching assist Chair/bed transfer assistive device: Armrests, Walker   Locomotion Ambulation   Ambulation assist      Assist level:  Contact Guard/Touching assist Assistive device: Walker-rolling Max distance: 100'   Walk 10 feet activity   Assist     Assist level: Contact Guard/Touching assist Assistive device: Walker-rolling   Walk 50 feet activity   Assist Walk 50 feet with 2 turns activity did not occur: Safety/medical concerns  Assist level: Contact Guard/Touching assist Assistive device: Walker-rolling    Walk 150 feet activity   Assist Walk 150 feet activity did not occur: Safety/medical concerns         Walk 10 feet on uneven surface  activity   Assist Walk 10 feet on uneven  surfaces activity did not occur: Safety/medical concerns         Wheelchair     Assist Is the patient using a wheelchair?: Yes Type of Wheelchair: Manual    Wheelchair assist level: Dependent - Patient 0%      Wheelchair 50 feet with 2 turns activity    Assist        Assist Level: Dependent - Patient 0%   Wheelchair 150 feet activity     Assist      Assist Level: Dependent - Patient 0%   Blood pressure (!) 150/74, pulse 86, temperature 97.6 F (36.4 C), temperature source Oral, resp. rate 18, height 6\' 4"  (1.93 m), weight 74.2 kg, SpO2 95 %.  Medical Problem List and Plan: 1. Functional deficits secondary to fall sustaining right superior inferior pubic ramus fracture/pelvic hematoma.  Status postembolization of right pectineal branch artery.  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  Weightbearing as tolerated.             -patient may  shower             -ELOS/Goals: 12/27 , sup/mod I  -Continue CIR therapies including PT, OT   2.  Antithrombotics: -DVT/anticoagulation:  Mechanical: Antiembolism stockings, thigh (TED hose) Bilateral lower extremities             -antiplatelet therapy: Resume Plavix 75 mg daily 10/30/2021 3. Pain Management: Hydrocodone as needed, took 2 this morning prior to therapy.   12/23 have reduced pain meds due to AMS. Pt is aware that he will have to work thru pain 4. Mood: Aricept 5 mg daily, Wellbutrin 150 mg daily, 300 mg daily, Namenda 10 mg twice daily, Ativan every 8 hours as needed             -antipsychotic agents: N/A 5. Neuropsych: This patient is capable of making decisions on his own behalf.  Mild dementia, forgetful but can make decisions with assistance 12/23 improved to baseline? 6. Skin/Wound Care: Routine skin checks 7. Fluids/Electrolytes/Nutrition: encourage PO, hd been losing weight PTA  -protein supp for low albumin 8.  Acute blood loss anemia.  hgb 11.7 12/19 9.   Hyperlipidemia.  Lipitor 10.  Chronic combined systolic diastolic congestive heart failure.  Monitor for any signs of fluid overload   Filed Weights   10/27/21 0530 10/28/21 0310 10/30/21 0522  Weight: 68.2 kg 72.8 kg 74.2 kg    12/20- weight dropped 6 kg- which is unlikely- will having nursing recheck  12/23- Weights between 68 and 74kg. 11.  History of left bundle branch block.  Status post pacemaker 2013.  Follow-up outpatient 12.  Type 2 diabetes mellitus.  Hemoglobin A1c 6.7.  SSI.  Patient on Synjardy XR 2 tabs daily prior to admission.   CBG (last 3)  Recent Labs    10/29/21 1633 10/29/21 2105 10/30/21 1131  GLUCAP  138* 122* 131*   12/19 sub-optimal control. Resume only metformin from synjardy for now, 250mg  bid  12/23- CBGs under reasonable control  13.  Urinary retention/BPH.  Urinary tension possibly from pelvic hematoma.  Foley catheter tube removed.    -continue voiding trial   -has required 2 I/O caths for 500cc, bloody urine  -begin flomax 0.4mg  qpm  - ucx negative -12/23 have increased urecholine to 25mg  tid, will try qid schedule 14. Constipation  12/22 still not emptying   -sorbitol today, sse if needed    LOS: 6 days A FACE TO FACE EVALUATION WAS PERFORMED  Meredith Staggers 10/30/2021, 12:46 PM

## 2021-10-31 LAB — GLUCOSE, CAPILLARY
Glucose-Capillary: 119 mg/dL — ABNORMAL HIGH (ref 70–99)
Glucose-Capillary: 142 mg/dL — ABNORMAL HIGH (ref 70–99)
Glucose-Capillary: 113 mg/dL — ABNORMAL HIGH (ref 70–99)
Glucose-Capillary: 128 mg/dL — ABNORMAL HIGH (ref 70–99)
Glucose-Capillary: 133 mg/dL — ABNORMAL HIGH (ref 70–99)

## 2021-10-31 LAB — BASIC METABOLIC PANEL
Anion gap: 9 (ref 5–15)
BUN: 21 mg/dL (ref 8–23)
CO2: 27 mmol/L (ref 22–32)
Calcium: 9.2 mg/dL (ref 8.9–10.3)
Chloride: 99 mmol/L (ref 98–111)
Creatinine, Ser: 1.18 mg/dL (ref 0.61–1.24)
GFR, Estimated: >60 mL/min (ref >60)
Glucose, Bld: 172 mg/dL — ABNORMAL HIGH (ref 70–99)
Potassium: 4.5 mmol/L (ref 3.5–5.1)
Sodium: 135 mmol/L (ref 135–145)

## 2021-10-31 MED ORDER — POLYETHYLENE GLYCOL 3350 17 G PO PACK
17.0000 g | PACK | Freq: Two times a day (BID) | ORAL | Status: DC
  Administered 2021-11-01 – 2021-11-03 (×5): 17 g via ORAL
  Filled 2021-10-31 (×6): qty 1

## 2021-10-31 NOTE — Progress Notes (Signed)
Physical Therapy Session Note  Patient Details  Name: Kevin [name] MRN: [id number] Date of Birth: 1946-09-08  Today's Date: 10/31/2021 PT Individual Time: 0803-0915 PT Individual Time Calculation (min): 72 min   Short Term Goals: Week 1:  PT Short Term Goal 1 (Week 1): Pt will perform bed mobility with supervision PT Short Term Goal 2 (Week 1): Pt will perform transfers with CGA PT Short Term Goal 3 (Week 1): Pt will gait x 50' with CGA PT Short Term Goal 4 (Week 1): Pt will perform stair negotiation with min assist   Skilled Therapeutic Interventions/Progress Updates:   Pt received supine in bed and agreeable to PT. Reports BLE pain due to length of bed putting pressure on feet, but does not rate. Supine>sit transfer with min assist and cues for position of the RLE. Once EOB, pt reports need for BM. Stand pivot transfer with mod assist to [facility] with mild knee instability in the LLE. Transported to bathroom. Stand pivot to toilet with RW and min assist. Attmepted void of bowel and bladder without success. Stadn pivot to [facility] with mod assist from low seat height.   Transported to rehab gym in [facility]. Sit<>stand from various surfaces in gym including WC, mat table and nustep with min assist and cues for UE placement to push from sitting surface.  Gait training with RW 2 x 76ft with CGA for safety. Noted to have flexed knee position and antalgic pattern on the RLE, but reports no pain.   Nustep reciprocal movement and endurance training x 6 minutes with cues for decreased speed and ROM to limit pain in pelvis. Pt reports no increase in pain throughout endurance training.   Patient returned to room and performed stand pivot to recliner with min assist with RW. Reports excessive pressure on the sarcum. Stand pivot to bed wth min assist. Sit>supine near [facility] to allow room for BLE with min assist. Left supine in bed with call bell in reach and alarm set.       Therapy Documentation Precautions:   Precautions Precautions: Fall Precaution Comments: pacemaker Restrictions Weight Bearing Restrictions: No RLE Weight Bearing: Weight bearing as tolerated     Therapy/Group: Individual Therapy  Lorie Phenix 10/31/2021, 9:24 AM

## 2021-10-31 NOTE — Progress Notes (Signed)
Occupational Therapy Session Note  Patient Details  Name: Kevin [name] MRN: [id number] Date of Birth: [birth year]  Today's Date: 10/31/2021 OT Individual Time: 1110-1135 OT Individual Time Calculation (min): 25 min    Short Term Goals: Week 1:  OT Short Term Goal 1 (Week 1): Pt will complete 1/3 components of toileting with CGA for balance OT Short Term Goal 2 (Week 1): Pt will complete LB self care at sit<stand level with no more than CGA for balance assistance OT Short Term Goal 3 (Week 1): Pt will thread 1 LE into pants with supervision using AE as needed  Skilled Therapeutic Interventions/Progress Updates:    Pt resting in bed upon arrival and initially agreeable to getting OOB. Supine>sit EOB with supervision using bed rails. Pt c/o increased pelvic pain when attempting sit<>stand and returned to supine. After rest, pt attempted sit<>stand again with increased pain. Attempted task X 3 with no success. Pt stated he just couldn't "do it." Pt remained in bed with soft waist belt activated. All needs within reach.   Therapy Documentation Precautions:  Precautions Precautions: Fall Precaution Comments: pacemaker Restrictions Weight Bearing Restrictions: No RLE Weight Bearing: Weight bearing as tolerated  Pain: Pt initially denied pain but c/o increased pelvic pain with activity; repositioned   Therapy/Group: Individual Therapy  Leroy Libman 10/31/2021, 11:45 AM

## 2021-10-31 NOTE — Progress Notes (Addendum)
PROGRESS NOTE   Subjective/Complaints: Pt says air mattress/bed was helpful for his buttock but that the bed itself is too short  ROS: Patient denies fever, rash, sore throat, blurred vision, nausea, vomiting, diarrhea, cough, shortness of breath or chest pain,  headache, or mood change.      Objective:   No results found. No results for input(s): WBC, HGB, HCT, PLT in the last 72 hours.  Recent Labs    10/31/21 0720  NA 135  K 4.5  CL 99  CO2 27  GLUCOSE 172*  BUN 21  CREATININE 1.18  CALCIUM 9.2     Intake/Output Summary (Last 24 hours) at 10/31/2021 1206 Last data filed at 10/31/2021 0618 Gross per 24 hour  Intake 120 ml  Output 901 ml  Net -781 ml        Physical Exam: Vital Signs Blood pressure (!) 144/75, pulse 97, temperature 97.6 F (36.4 C), temperature source Oral, resp. rate 18, height 6\' 4"  (1.93 m), weight 75 kg, SpO2 99 %.     Constitutional: No distress . Vital signs reviewed. HEENT: NCAT, EOMI, oral membranes moist Neck: supple Cardiovascular: RRR without murmur. No JVD    Respiratory/Chest: CTA Bilaterally without wheezes or rales. Normal effort    GI/Abdomen: BS +, non-tender, non-distended Ext: no clubbing, cyanosis, or edema Psych: pleasant and cooperative  Skin: No evidence of breakdown, no evidence of rash.  A few bruises right arm,hip are present, buttock irritation? Neurologic: STM deficits. More alert and improved insight.  Cranial nerves II through XII intact, motor strength is 5/5 in bilateral deltoid, bicep, tricep, grip.  3- to 3/5 RIght lower ext prox to 4/5 distal, 5/5 Left hip flexor, knee extensors, ankle dorsiflexor and plantar flexor Reasonable standing balance once again. Musculoskeletal: Full range of motion in all 4 extremities. No joint swelling   Assessment/Plan: 1. Functional deficits which require 3+ hours per day of interdisciplinary therapy in a  comprehensive inpatient rehab setting. Physiatrist is providing close team supervision and 24 hour management of active medical problems listed below. Physiatrist and rehab team continue to assess barriers to discharge/monitor patient progress toward functional and medical goals  Care Tool:  Bathing    Body parts bathed by patient: Right arm, Left arm, Chest, Abdomen, Front perineal area, Buttocks, Right upper leg, Left upper leg, Face, Right lower leg, Left lower leg   Body parts bathed by helper: Right lower leg, Left lower leg     Bathing assist Assist Level: Supervision/Verbal cueing     Upper Body Dressing/Undressing Upper body dressing   What is the patient wearing?: Pull over shirt    Upper body assist Assist Level: Independent    Lower Body Dressing/Undressing Lower body dressing      What is the patient wearing?: Underwear/pull up, Pants     Lower body assist Assist for lower body dressing: Minimal Assistance - Patient > 75%     Toileting Toileting Toileting Activity did not occur (Clothing management and hygiene only): N/A (no void or bm)  Toileting assist Assist for toileting: Supervision/Verbal cueing     Transfers Chair/bed transfer  Transfers assist     Chair/bed transfer assist level:  Contact Guard/Touching assist Chair/bed transfer assistive device: Armrests, Programmer, multimedia   Ambulation assist      Assist level: Contact Guard/Touching assist Assistive device: Walker-rolling Max distance: 100'   Walk 10 feet activity   Assist     Assist level: Contact Guard/Touching assist Assistive device: Walker-rolling   Walk 50 feet activity   Assist Walk 50 feet with 2 turns activity did not occur: Safety/medical concerns  Assist level: Contact Guard/Touching assist Assistive device: Walker-rolling    Walk 150 feet activity   Assist Walk 150 feet activity did not occur: Safety/medical concerns         Walk 10  feet on uneven surface  activity   Assist Walk 10 feet on uneven surfaces activity did not occur: Safety/medical concerns         Wheelchair     Assist Is the patient using a wheelchair?: Yes Type of Wheelchair: Manual    Wheelchair assist level: Dependent - Patient 0%      Wheelchair 50 feet with 2 turns activity    Assist        Assist Level: Dependent - Patient 0%   Wheelchair 150 feet activity     Assist      Assist Level: Dependent - Patient 0%   Blood pressure (!) 144/75, pulse 97, temperature 97.6 F (36.4 C), temperature source Oral, resp. rate 18, height 6\' 4"  (1.93 m), weight 75 kg, SpO2 99 %.  Medical Problem List and Plan: 1. Functional deficits secondary to fall sustaining right superior inferior pubic ramus fracture/pelvic hematoma.  Status postembolization of right pectineal branch artery.  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  Weightbearing as tolerated.             -patient may  shower             -ELOS/Goals: 12/27 , sup/mod I  -Continue CIR therapies including PT, OT, and SLP   2.  Antithrombotics: -DVT/anticoagulation:  Mechanical: Antiembolism stockings, thigh (TED hose) Bilateral lower extremities             -antiplatelet therapy: Resume Plavix 75 mg daily 10/30/2021 3. Pain Management: only tylenol for pain d//t cognitive side effects from narcotics, etc.  -trying air mattress also to help with pelvic pain/skin 4. Mood: Aricept 5 mg daily, Wellbutrin 150 mg daily, 300 mg daily, Namenda 10 mg twice daily, Ativan every 8 hours as needed             -antipsychotic agents: N/A 5. Neuropsych: This patient is capable of making decisions on his own behalf.  Mild dementia, forgetful but can make decisions with assistance 12/23 improved to baseline? 6. Skin/Wound Care: Routine skin checks 7. Fluids/Electrolytes/Nutrition: encourage PO, hd been losing weight PTA  -protein supp for low albumin  -12/24  I personally reviewed all of the patient's labs today, and lab work is within normal limits.  8.  Acute blood loss anemia.  hgb 11.7 12/19 9.  Hyperlipidemia.  Lipitor 10.  Chronic combined systolic diastolic congestive heart failure.  Monitor for any signs of fluid overload   Filed Weights   10/28/21 0310 10/30/21 0522 10/31/21 0501  Weight: 72.8 kg 74.2 kg 75 kg    12/20- weight dropped 6 kg- which is unlikely- will having nursing recheck  12/24 weight up to 75kg but weights inconsistent, doesn't look overloaded. Continue to monitor  -5L since admit 11.  History of left bundle branch block.  Status post  pacemaker 2013.  Follow-up outpatient 12.  Type 2 diabetes mellitus.  Hemoglobin A1c 6.7.  SSI.  Patient on Synjardy XR 2 tabs daily prior to admission.   CBG (last 3)  Recent Labs    10/30/21 2105 10/31/21 0607 10/31/21 1148  GLUCAP 113* 142* 113*   12/19 sub-optimal control. Resume only metformin from synjardy for now, 250mg  bid  12/24- CBGs under reasonable control  13.  Urinary retention/BPH.  Urinary tension possibly from pelvic hematoma.  Foley catheter tube removed.    -continue voiding trial   -has required 2 I/O caths for 500cc, bloody urine  -begin flomax 0.4mg  qpm  - ucx negative -12/24 urecholine  increased 25mg   qid 12/23  -may have to go home doing caths  -stand at toilet when possible to empty 14. Constipation  12/24 had large bm after sorbitol yesterday   -increase miralax to bid    LOS: 7 days A FACE TO FACE EVALUATION WAS PERFORMED  Meredith Staggers 10/31/2021, 12:06 PM

## 2021-11-01 LAB — GLUCOSE, CAPILLARY
Glucose-Capillary: 140 mg/dL — ABNORMAL HIGH (ref 70–99)
Glucose-Capillary: 109 mg/dL — ABNORMAL HIGH (ref 70–99)
Glucose-Capillary: 94 mg/dL (ref 70–99)
Glucose-Capillary: 111 mg/dL — ABNORMAL HIGH (ref 70–99)

## 2021-11-01 NOTE — Discharge Summary (Signed)
Physician Discharge Summary  Patient ID: Kevin [name] MRN: [id number] DOB/AGE: 75-Oct-1947 75 y.o.  Admit date: 10/24/2021 Discharge date: 11/03/2021  Discharge Diagnoses:  Principal Problem:   Pubic ramus fracture (HCC) Active Problems:   Fracture of multiple pubic rami with routine healing, right   Malnutrition of moderate degree Mood stabilization Acute blood loss anemia secondary to pelvic hematoma Hyperlipidemia Chronic combined systolic diastolic congestive heart failure History of left bundle branch block Type 2 diabetes mellitus BPH  Discharged Condition: Stable  Significant Diagnostic Studies: CT Head Wo Contrast  Result Date: 10/20/2021 CLINICAL DATA:  Head neck trauma.  Found on ground.  Hypothermia. EXAM: CT HEAD WITHOUT CONTRAST CT CERVICAL SPINE WITHOUT CONTRAST TECHNIQUE: Multidetector CT imaging of the head and cervical spine was performed following the standard protocol without intravenous contrast. Multiplanar CT image reconstructions of the cervical spine were also generated. COMPARISON:  02/11/2020 head CT FINDINGS: CT HEAD FINDINGS Brain: No evidence of acute infarction, hemorrhage, hydrocephalus, extra-axial collection or mass lesion/mass effect. Remote left occipital and left cerebellar infarcts. Generalized brain atrophy. Vascular: Negative Skull: Normal. Negative for fracture or focal lesion. Sinuses/Orbits: No acute finding. CT CERVICAL SPINE FINDINGS Alignment: No traumatic malalignment Skull base and vertebrae: No acute fracture or aggressive bone lesion. Soft tissues and spinal canal: No prevertebral fluid or swelling. No visible canal hematoma. 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 Prominent adjacent disc and facet degeneration. Upper chest: Biapical pleural based scarring with calcification IMPRESSION: No evidence of acute intracranial or cervical spine injury. Electronically Signed   By: Jorje Guild M.D.   On: 10/20/2021 05:29   CT Cervical Spine Wo  Contrast  Result Date: 10/20/2021 CLINICAL DATA:  Head neck trauma.  Found on ground.  Hypothermia. EXAM: CT HEAD WITHOUT CONTRAST CT CERVICAL SPINE WITHOUT CONTRAST TECHNIQUE: Multidetector CT imaging of the head and cervical spine was performed following the standard protocol without intravenous contrast. Multiplanar CT image reconstructions of the cervical spine were also generated. COMPARISON:  02/11/2020 head CT FINDINGS: CT HEAD FINDINGS Brain: No evidence of acute infarction, hemorrhage, hydrocephalus, extra-axial collection or mass lesion/mass effect. Remote left occipital and left cerebellar infarcts. Generalized brain atrophy. Vascular: Negative Skull: Normal. Negative for fracture or focal lesion. Sinuses/Orbits: No acute finding. CT CERVICAL SPINE FINDINGS Alignment: No traumatic malalignment Skull base and vertebrae: No acute fracture or aggressive bone lesion. Soft tissues and spinal canal: No prevertebral fluid or swelling. No visible canal hematoma. 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 Prominent adjacent disc and facet degeneration. Upper chest: Biapical pleural based scarring with calcification IMPRESSION: No evidence of acute intracranial or cervical spine injury. Electronically Signed   By: Jorje Guild M.D.   On: 10/20/2021 05:29   IR Angiogram Visceral Selective  Result Date: 10/20/2021 INDICATION: 75 year old male status post fall with multiple pelvic fractures and pelvic hematoma with active extravasation. EXAM: 1. Ultrasound-guided vascular access of the left common femoral artery. 2. Catheterization angiography of the right common iliac artery, right external iliac artery, anomalous right obturator artery, and obturator pectineal branch artery. 3. Coil embolization of obturator pectineal branch artery. 4. 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 MEDICATIONS: None. ANESTHESIA/SEDATION: Moderate (conscious) sedation was employed during this procedure. A total of Versed  2 mg and Fentanyl 100 mcg was administered intravenously. Moderate Sedation Time: 39 minutes. The patient's level of consciousness and vital signs were monitored continuously by radiology nursing throughout the procedure under my direct supervision. FLUOROSCOPY TIME:  Fluoroscopy Time: 8.3 minutes (161 mGy). COMPLICATIONS: None immediate. PROCEDURE: Informed consent  was obtained from the patient following explanation of the procedure, risks, benefits and alternatives. The patient understands, agrees and consents for the procedure. All questions were addressed. A time out was performed prior to the initiation of the procedure. Maximal barrier sterile technique utilized including caps, mask, sterile gowns, sterile gloves, large sterile drape, hand hygiene, and Betadine prep. The left groin was prepped and draped in standard fashion. Preprocedure ultrasound evaluation demonstrated patency of the left common femoral artery. The procedure was planned. Subdermal Local anesthesia was provided with 1% lidocaine. A small skin nick was made. Under direct ultrasound visualization, the left common femoral artery was accessed with a 20 gauge micropuncture needle. A permanent image was captured and stored in the record. A micropuncture set was introduced and a limited left lower extremity angiogram was performed which demonstrated adequate puncture site for closure device use. A J wire was inserted over which the micropuncture set was exchanged for a 5 [place], 10 cm vascular sheath. An Omni Flush catheter was inserted over the wire to the level of the distal abdominal aorta. The wire was removed and exchanged for a Glidewire which was then positioned in the right external iliac artery. The Omni Flush catheter was advanced to the level of the common iliac artery. Angiogram was performed from the right common iliac artery which demonstrated wide patency of the inflow vessels. There is a focus of active extravasation arising just  superior to the right pubic ramus which appears to arise from an replaced right obturator artery, otherwise known as corona mortis, originating from the distal right external iliac artery. The Omni Flush catheter was exchanged over a J wire for a 5 French angled tip Kumpe the catheter. The Kumpe catheter was directed to the distal right external iliac artery. Through the Kumpe the catheter, a Lantern microcatheter and Fathom 16 microwire were inserted in used to select the corona mortis. Angiogram was performed from the proximal corona mortis which demonstrated active extravasation arising from a superior pectineal branch. There is also a larger focus of blushing without evidence of active extravasation just to the right word aspect of the pubic symphysis, presumed prostatic branch. The superior pectineal branch was then catheterized. Repeat angiogram demonstrated persistent active extravasation/pseudoaneurysm. Coil embolization was then performed with 2, 1 x 4 mm low-profile Penumbra coils followed by a single 2 x 4 mm coil. The catheter was retracted slightly and repeat angiogram was performed which demonstrated complete embolization of the superior pectineal branch. The catheter was retracted into the proximal right corona mortis. A Gel-Foam slurry was then administered in until near complete hemostasis. The catheter was flushed and completion angiogram was performed from the proximal right corona mortis without evidence of persistent arterial blushing or active extravasation. The catheters were removed. A 6 French Angio-Seal device was used to close the left common femoral artery arteriotomy site which was deployed successfully. The distal pulses were unchanged. The patient tolerated the procedure well was transferred to the floor in stable condition. IMPRESSION: 1. Active extravasation from a superior pectineal branch arising from a replaced right obturator artery stent arising from the distal right external  iliac artery (AKA "corona mortis"). 2. Technically successful coil embolization of the superior pectineal branch artery. 3. Technically successful Gel-Foam embolization of the replaced right obturator artery. Ruthann Cancer, MD [facility] Radiology Electronically Signed   By: Ruthann Cancer M.D.   On: 10/20/2021 16:50   IR Angiogram Pelvis Selective Or Supraselective  Result Date: 10/20/2021  INDICATION: 75 year old male status post fall with multiple pelvic fractures and pelvic hematoma with active extravasation. EXAM: 1. Ultrasound-guided vascular access of the left common femoral artery. 2. Catheterization angiography of the right common iliac artery, right external iliac artery, anomalous right obturator artery, and obturator pectineal branch artery. 3. Coil embolization of obturator pectineal branch artery. 4. 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 MEDICATIONS: None. ANESTHESIA/SEDATION: Moderate (conscious) sedation was employed during this procedure. A total of Versed 2 mg and Fentanyl 100 mcg was administered intravenously. Moderate Sedation Time: 39 minutes. The patient's level of consciousness and vital signs were monitored continuously by radiology nursing throughout the procedure under my direct supervision. FLUOROSCOPY TIME:  Fluoroscopy Time: 8.3 minutes (161 mGy). COMPLICATIONS: None immediate. PROCEDURE: Informed consent was obtained from the patient following explanation of the procedure, risks, benefits and alternatives. The patient understands, agrees and consents for the procedure. All questions were addressed. A time out was performed prior to the initiation of the procedure. Maximal barrier sterile technique utilized including caps, mask, sterile gowns, sterile gloves, large sterile drape, hand hygiene, and Betadine prep. The left groin was prepped and draped in standard fashion. Preprocedure ultrasound evaluation  demonstrated patency of the left common femoral artery. The procedure was planned. Subdermal Local anesthesia was provided with 1% lidocaine. A small skin nick was made. Under direct ultrasound visualization, the left common femoral artery was accessed with a 20 gauge micropuncture needle. A permanent image was captured and stored in the record. A micropuncture set was introduced and a limited left lower extremity angiogram was performed which demonstrated adequate puncture site for closure device use. A J wire was inserted over which the micropuncture set was exchanged for a 5 [place], 10 cm vascular sheath. An Omni Flush catheter was inserted over the wire to the level of the distal abdominal aorta. The wire was removed and exchanged for a Glidewire which was then positioned in the right external iliac artery. The Omni Flush catheter was advanced to the level of the common iliac artery. Angiogram was performed from the right common iliac artery which demonstrated wide patency of the inflow vessels. 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 The Omni Flush catheter was exchanged over a J wire for a 5 French angled tip Kumpe the catheter. The Kumpe catheter was directed to the distal right external iliac artery. Through the Kumpe the catheter, a Lantern microcatheter and Fathom 16 microwire were inserted in used to select the corona mortis. Angiogram was performed from the proximal corona mortis which demonstrated active extravasation arising from a superior pectineal branch. There is also a larger focus of blushing without evidence of active extravasation just to the right word aspect of the pubic symphysis, presumed prostatic branch. The superior pectineal branch was then catheterized. Repeat angiogram demonstrated  persistent active extravasation/pseudoaneurysm. Coil embolization was then performed with 2, 1 x 4 mm low-profile Penumbra coils followed by a single 2 x 4 mm coil. The catheter was retracted slightly and repeat angiogram was performed which demonstrated complete embolization of the superior pectineal branch. The catheter was retracted into the proximal right corona mortis. A Gel-Foam slurry was then administered in until near complete hemostasis. The catheter was flushed and completion angiogram was performed from the proximal right corona mortis without evidence of persistent arterial blushing or  active extravasation. The catheters were removed. A 6 French Angio-Seal device was used to close the left common femoral artery arteriotomy site which was deployed successfully. The distal pulses were unchanged. The patient tolerated the procedure well was transferred to the floor in stable condition. IMPRESSION: 1. 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 2. Technically successful coil embolization of the superior pectineal branch artery. 3. Technically successful Gel-Foam embolization of the replaced right obturator artery. Ruthann Cancer, MD [facility] Radiology Electronically Signed   By: Ruthann Cancer M.D.   On: 10/20/2021 16:50   US RENAL  Result Date: 10/22/2021 CLINICAL DATA:  Hematuria EXAM: RENAL / URINARY TRACT ULTRASOUND COMPLETE COMPARISON:  CT 10/20/2021 FINDINGS: Right Kidney: Renal measurements: 10.5 x 5.3 x 4.0 cm = volume: 115.2 mL. Echogenicity within normal limits. No mass or hydronephrosis visualized. Left Kidney: Renal measurements: 10.7 x 5.4 x 4 cm = volume: 121.3 mL. Echogenicity within normal limits. No hydronephrosis. Cyst at the upper pole measuring 20 mm. Bladder: Decompressed by Foley catheter. Other:  Heterogeneous mass in the right pelvis measuring 6.4 x 4.4 x 6.4 cm, corresponding to right pelvic hematoma. IMPRESSION: 1. Small simple cyst left kidney.  Kidneys are otherwise normal. 2. Urinary bladder is decompressed by Foley catheter 3. 6.4 cm heterogeneous right pelvic mass presumably corresponds to the large hematoma noted on prior CT Electronically Signed   By: Donavan Foil M.D.   On: 10/22/2021 18:29   CT Hip Right Wo Contrast  Result Date: 10/20/2021 CLINICAL DATA:  Hip trauma, fracture suspected, xray done EXAM: CT OF THE RIGHT HIP WITHOUT CONTRAST TECHNIQUE: Multidetector CT imaging of the right hip was performed according to the standard protocol. Multiplanar CT image reconstructions were also generated. COMPARISON:  Same day radiograph FINDINGS: Bones/Joint/Cartilage There are mildly displaced right superior and inferior pubic ramus fractures. There is no evidence of femoral neck fracture. The acetabulum is intact. 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 No focal bone lesion identified. Ligaments Suboptimally assessed by CT. Muscles and Tendons No muscle atrophy. Soft tissues Within the right hemipelvis, there is a high-density 10.9 x 5.8 x 8.3 cm collection and adjacent fluid/stranding. There is a mass effect on the bladder. IMPRESSION: Mildly displaced right superior and inferior pubis ramus fractures. Large hematoma within the right hemipelvis with adjacent fluid/stranding, measuring 10.9 x 5.8 x 8.3 cm. Electronically Signed   By: Maurine Simmering M.D.   On: 10/20/2021 10:11   IR US Guide Vasc Access Left  Result Date: 10/20/2021 INDICATION: 75 year old male status post fall with multiple pelvic fractures and pelvic hematoma with active extravasation. EXAM: 1. Ultrasound-guided vascular access of the left common femoral artery. 2. Catheterization angiography of the right common iliac artery, right external iliac artery, anomalous right obturator artery, and obturator pectineal branch  artery. 3. Coil embolization of obturator pectineal branch artery. 4. 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 MEDICATIONS: None. ANESTHESIA/SEDATION: Moderate (conscious) sedation was employed during this procedure. A total of Versed 2 mg and Fentanyl 100 mcg was administered intravenously. Moderate Sedation Time: 39 minutes. The patient's level of consciousness and vital signs were monitored continuously by radiology nursing throughout the procedure under my direct supervision. FLUOROSCOPY TIME:  Fluoroscopy Time: 8.3 minutes (161 mGy). COMPLICATIONS: None immediate. PROCEDURE: Informed consent was obtained from the patient following explanation of the procedure, risks, benefits and alternatives. The patient  understands, agrees and consents for the procedure. All questions were addressed. A time out was performed prior to the initiation of the procedure. Maximal barrier sterile technique utilized including caps, mask, sterile gowns, sterile gloves, large sterile drape, hand hygiene, and Betadine prep. The left groin was prepped and draped in standard fashion. Preprocedure ultrasound evaluation demonstrated patency of the left common femoral artery. The procedure was planned. Subdermal Local anesthesia was provided with 1% lidocaine. A small skin nick was made. Under direct ultrasound visualization, the left common femoral artery was accessed with a 20 gauge micropuncture needle. A permanent image was captured and stored in the record. A micropuncture set was introduced and a limited left lower extremity angiogram was performed which demonstrated adequate puncture site for closure device use. A J wire was inserted over which the micropuncture set was exchanged for a 5 [place], 10 cm vascular sheath. An Omni Flush catheter was inserted over the wire to the level of the distal abdominal aorta. The wire was removed and exchanged for a Glidewire which was then positioned in the right external iliac  artery. The Omni Flush catheter was advanced to the level of the common iliac artery. Angiogram was performed from the right common iliac artery which demonstrated wide patency of the inflow vessels. 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 The Omni Flush catheter was exchanged over a J wire for a 5 French angled tip Kumpe the catheter. The Kumpe catheter was directed to the distal right external iliac artery. Through the Kumpe the catheter, a Lantern microcatheter and Fathom 16 microwire were inserted in used to select the corona mortis. Angiogram was performed from the proximal corona mortis which demonstrated active extravasation arising from a superior pectineal branch. There is also a larger focus of blushing without evidence of active extravasation just to the right word aspect of the pubic symphysis, presumed prostatic branch. The superior pectineal branch was then catheterized. Repeat angiogram demonstrated persistent active extravasation/pseudoaneurysm. Coil embolization was then performed with 2, 1 x 4 mm low-profile Penumbra coils followed by a single 2 x 4 mm coil. The catheter was retracted slightly and repeat angiogram was performed which demonstrated complete embolization of the superior pectineal branch. The catheter was retracted into the proximal right corona mortis. A Gel-Foam slurry was then administered in until near complete hemostasis. The catheter was flushed and completion angiogram was performed from the proximal right corona mortis without evidence of persistent arterial blushing or active extravasation. The catheters were removed. A 6 French Angio-Seal device was used to close the left common femoral artery arteriotomy site which was deployed successfully. The distal pulses were unchanged.  The patient tolerated the procedure well was transferred to the floor in stable condition. IMPRESSION: 1. 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 2. Technically successful coil embolization of the superior pectineal branch artery. 3. Technically successful Gel-Foam embolization of the replaced right obturator artery. Ruthann Cancer, MD [facility] Radiology Electronically Signed   By: Ruthann Cancer M.D.   On: 10/20/2021 16:50   DG Chest Port 1 View  Result Date: 10/20/2021 CLINICAL DATA:  Sepsis evaluation. EXAM: PORTABLE CHEST 1 VIEW COMPARISON:  Portable chest 09/13/2016 FINDINGS: Cardiac  size is normal. Left chest dual lead pacing system with AID wiring is again noted and unchanged. The lungs are mildly emphysematous but clear of infiltrates. There are overlying wires and artifacts. No pleural effusion is seen. Mild osteopenia. IMPRESSION: No active disease.  Stable COPD chest. Electronically Signed   By: Telford Nab M.D.   On: 10/20/2021 06:06   DG Hip Port Unilat W or Wo Pelvis 1 View Right  Result Date: 10/20/2021 CLINICAL DATA:  75 year old male with history of trauma from a fall. Right-sided hip pain. EXAM: DG HIP (WITH OR WITHOUT PELVIS) 1V PORT RIGHT COMPARISON:  No priors. FINDINGS: Three views of the bony pelvis in the right hip demonstrate no definite acute displaced fracture, subluxation or dislocation. There is joint space narrowing, subchondral sclerosis, subchondral cyst formation and osteophyte formation in the hip joints bilaterally, indicative of moderate to severe osteoarthritis. IMPRESSION: 1. No acute radiographic abnormality of the bony pelvis or the right hip. 2. 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 Electronically Signed   By: Vinnie Langton M.D.   On: 10/20/2021 06:06    IR EMBO ART  VEN HEMORR LYMPH EXTRAV  INC GUIDE ROADMAPPING  Result Date: 10/20/2021 INDICATION: 75 year old male status post fall with multiple pelvic fractures and pelvic hematoma with active extravasation. EXAM: 1. Ultrasound-guided vascular access of the left common femoral artery. 2. Catheterization angiography of the right common iliac artery, right external iliac artery, anomalous right obturator artery, and obturator pectineal branch artery. 3. Coil embolization of obturator pectineal branch artery. 4. 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 MEDICATIONS: None. ANESTHESIA/SEDATION: Moderate (conscious) sedation was employed during this procedure. A total of Versed 2 mg and Fentanyl 100 mcg was administered intravenously. Moderate Sedation Time: 39 minutes. The patient's level of consciousness and vital signs were monitored continuously by radiology nursing throughout the procedure under my direct supervision. FLUOROSCOPY TIME:  Fluoroscopy Time: 8.3 minutes (161 mGy). COMPLICATIONS: None immediate. PROCEDURE: Informed consent was obtained from the patient following explanation of the procedure, risks, benefits and alternatives. The patient understands, agrees and consents for the procedure. All questions were addressed. A time out was performed prior to the initiation of the procedure. Maximal barrier sterile technique utilized including caps, mask, sterile gowns, sterile gloves, large sterile drape, hand hygiene, and Betadine prep. The left groin was prepped and draped in standard fashion. Preprocedure ultrasound evaluation demonstrated patency of the left common femoral artery. The procedure was planned. Subdermal Local anesthesia was provided with 1% lidocaine. A small skin nick was made. Under direct ultrasound visualization, the left common femoral artery was accessed with a 20 gauge micropuncture needle. A permanent image was captured and stored in the record. A  micropuncture set was introduced and a limited left lower extremity angiogram was performed which demonstrated adequate puncture site for closure device use. A J wire was inserted over which the micropuncture set was exchanged for a 5 [place], 10 cm vascular sheath. An Omni Flush catheter was inserted over the wire to the level of the distal abdominal aorta. The wire was removed and exchanged for a Glidewire which was then positioned in the right external iliac artery. The Omni Flush catheter was advanced to the level of the common iliac artery. Angiogram was performed from the right common iliac artery which demonstrated wide patency of the inflow vessels. There is a focus of active extravasation arising just superior to the right pubic ramus which appears to arise from an replaced right obturator artery, otherwise known  as corona mortis, originating from the distal right external iliac artery. The Omni Flush catheter was exchanged over a J wire for a 5 French angled tip Kumpe the catheter. The Kumpe catheter was directed to the distal right external iliac artery. Through the Kumpe the catheter, a Lantern microcatheter and Fathom 16 microwire were inserted in used to select the corona mortis. Angiogram was performed from the proximal corona mortis which demonstrated active extravasation arising from a superior pectineal branch. There is also a larger focus of blushing without evidence of active extravasation just to the right word aspect of the pubic symphysis, presumed prostatic branch. The superior pectineal branch was then catheterized. Repeat angiogram demonstrated persistent active extravasation/pseudoaneurysm. Coil embolization was then performed with 2, 1 x 4 mm low-profile Penumbra coils followed by a single 2 x 4 mm coil. The catheter was retracted slightly and repeat angiogram was performed which demonstrated complete embolization of the superior pectineal branch. The catheter was retracted into the  proximal right corona mortis. A Gel-Foam slurry was then administered in until near complete hemostasis. The catheter was flushed and completion angiogram was performed from the proximal right corona mortis without evidence of persistent arterial blushing or active extravasation. The catheters were removed. A 6 French Angio-Seal device was used to close the left common femoral artery arteriotomy site which was deployed successfully. The distal pulses were unchanged. The patient tolerated the procedure well was transferred to the floor in stable condition. IMPRESSION: 1. 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 2. Technically successful coil embolization of the superior pectineal branch artery. 3. Technically successful Gel-Foam embolization of the replaced right obturator artery. Ruthann Cancer, MD [facility] Radiology Electronically Signed   By: Ruthann Cancer M.D.   On: 10/20/2021 16:50   VAS [place] LOWER EXTREMITY VENOUS (DVT)  Result Date: 10/26/2021  Lower Venous DVT Study Patient Name:  KOEHN SALEHI  Date of Exam:   10/25/2021 Medical Rec #: [id number]           Accession #:    [id number] Date of Birth: Nov 07, 1946           Patient Gender: M Patient Age:   74 years Exam Location:  [facility] Procedure:      VAS [place] LOWER EXTREMITY VENOUS (DVT) Referring Phys: Lauraine Rinne --------------------------------------------------------------------------------  Indications: Status post fall with pubis ramus fracture, now in rehabilitation.  Comparison Study: No prior study on file Performing Technologist: Sharion Dove RVS  Examination Guidelines: A complete evaluation includes B-mode imaging, spectral Doppler, color Doppler, and power Doppler as needed of all accessible portions of each vessel. Bilateral testing is  considered an integral part of a complete examination. Limited examinations for reoccurring indications may be performed as noted. The reflux portion of the exam is performed with the patient in reverse Trendelenburg.  +---------+---------------+---------+-----------+----------+--------------+  RIGHT     Compressibility Phasicity Spontaneity Properties Thrombus Aging  +---------+---------------+---------+-----------+----------+--------------+  CFV       Full            Yes       Yes                                    +---------+---------------+---------+-----------+----------+--------------+  SFJ       Full                                                             +---------+---------------+---------+-----------+----------+--------------+  FV Prox   Full                                                             +---------+---------------+---------+-----------+----------+--------------+  FV Mid    Full                                                             +---------+---------------+---------+-----------+----------+--------------+  FV Distal Full                                                             +---------+---------------+---------+-----------+----------+--------------+  PFV       Full                                                             +---------+---------------+---------+-----------+----------+--------------+  POP       Full            Yes       Yes                                    +---------+---------------+---------+-----------+----------+--------------+  PTV       Full                                                             +---------+---------------+---------+-----------+----------+--------------+  PERO      Full                                                             +---------+---------------+---------+-----------+----------+--------------+   +---------+---------------+---------+-----------+----------+--------------+  LEFT       Compressibility Phasicity Spontaneity Properties Thrombus Aging  +---------+---------------+---------+-----------+----------+--------------+  CFV       Full            Yes       Yes                                    +---------+---------------+---------+-----------+----------+--------------+  SFJ       Full                                                             +---------+---------------+---------+-----------+----------+--------------+  FV Prox   Full                                                             +---------+---------------+---------+-----------+----------+--------------+  FV Mid    Full                                                             +---------+---------------+---------+-----------+----------+--------------+  FV Distal Full                                                             +---------+---------------+---------+-----------+----------+--------------+  PFV       Full                                                             +---------+---------------+---------+-----------+----------+--------------+  POP       Full            Yes       Yes                                    +---------+---------------+---------+-----------+----------+--------------+  PTV       Full                                                             +---------+---------------+---------+-----------+----------+--------------+  PERO      Full                                                             +---------+---------------+---------+-----------+----------+--------------+     Summary: BILATERAL: - No evidence of deep vein thrombosis seen in the lower extremities, bilaterally. -No evidence of popliteal cyst, bilaterally.   *See table(s) above for measurements and observations. Electronically signed by Servando Snare MD on 10/26/2021 at 4:33:34 PM.    Final    CT Angio Abd/Pel w/ and/or w/o  Result Date: 10/20/2021 CLINICAL DATA:  Right pubic rami fractures. Hematoma in the right hemipelvis. Evaluate  for ongoing bleeding. EXAM: CT ANGIOGRAPHY ABDOMEN AND PELVIS WITH CONTRAST TECHNIQUE: Multidetector CT imaging of the abdomen and pelvis was performed using the standard protocol during bolus administration of intravenous contrast. Multiplanar reconstructed images and MIPs were obtained and reviewed to evaluate the vascular anatomy. CONTRAST:  172mL OMNIPAQUE IOHEXOL 350  MG/ML SOLN COMPARISON:  CT right hip 10/20/2021 FINDINGS: VASCULAR Aorta: Normal caliber aorta without aneurysm, dissection, vasculitis or significant stenosis. Celiac: Patent without evidence of aneurysm, dissection, vasculitis or significant stenosis. SMA: Patent without evidence of aneurysm, dissection, vasculitis or significant stenosis. Renals: Both renal arteries are patent without evidence of aneurysm, dissection, vasculitis, fibromuscular dysplasia or significant stenosis. IMA: Patent without evidence of aneurysm, dissection, vasculitis or significant stenosis. Inflow: Patent without evidence of aneurysm, dissection, vasculitis or significant stenosis. Proximal Outflow: Proximal femoral arteries are patent bilaterally. There is a coronal mortis vascular variant originating from the distal right external iliac artery. Coronal mortis branches appear to be supplying areas of extravasation along the right side of the prostate on sequence 5 image 220 and near the right superior pubic ramus on sequence 5, image 208. These areas of contrast extravasation enlarge on the venous images. Veins: Portal venous system is patent. Hepatic veins are patent. Renal veins are patent. Limited evaluation of the iliac veins due to the timing of the study. Review of the MIP images confirms the above findings. NON-VASCULAR Lower chest: Small nodules in the left lower lobe and some could be calcified. Largest nodule measures approximately 3 mm. Dependent changes in the lower lungs. No pleural effusions. Patient has a of ICD. Hepatobiliary: Normal appearance of the  liver. No biliary dilatation. Small calcified gallstone at the gallbladder base without distention or inflammatory changes. Pancreas: Unremarkable. No pancreatic ductal dilatation or surrounding inflammatory changes. Spleen: Normal in size without focal abnormality. Adrenals/Urinary Tract: Normal adrenal glands. 1.8 cm exophytic left renal cyst. No suspicious renal lesions. Probable small cyst in the right kidney upper pole. Negative for hydronephrosis. Urinary bladder is displaced towards the left from the right hemipelvic hematoma. Stomach/Bowel: Stomach is within normal limits. Appendix appears normal. No evidence of bowel wall thickening, distention, or inflammatory changes. Lymphatic: No lymph node enlargement in the abdomen or pelvis. Reproductive: Calcifications in the prostate. Small focus contrast extravasation along the right anterior aspect of the prostate. Seminal vesicles are slightly displaced towards the left. Other: There is a large right-sided pelvic hematoma that measures 8.2 x 6.0 x 5.9 cm. Small focus of bleeding within this large pelvic hematoma. Evidence for contrast accumulating along the right anterior aspect of the prostate compatible with contrast extravasation and bleeding. There is additional blood in the posterior right hemipelvis and there is blood tracking up along the anterior lower abdomen and into the right paracolic gutter region. Musculoskeletal: Minimally displaced right superior pubic ramus fracture. Displaced right inferior pubic ramus fracture. Fracture of the right inferior sacrum, best seen on sequence 3, image 848. Left pubic rami are intact. Both hips are located. Minimal retrolisthesis of L4 on L5. Disc space narrowing at L3-L4 and L4-L5. IMPRESSION: VASCULAR 1. Two foci of active bleeding in the right hemipelvis. Largest focus of bleeding is along the right anterior aspect of the prostate. Smaller focus of bleeding is near the right superior pubic ramus. These areas of  bleeding appear to be supplied by a corona mortis branch that originates from the distal right external iliac artery. NON-VASCULAR 1. Fractures of the right superior and right inferior pubic ramus. Right sacral fracture. Large right hemipelvic hematoma with blood extending into the lower abdomen. Bladder and seminal vesicles are displaced from the hematoma. 2. Small nonspecific nodules in left lung base, some of these could be calcified. Largest nodule measures 3 mm. No follow-up needed if patient is low-risk (and has no known or suspected primary neoplasm). Non-contrast  chest CT can be considered in 12 months if patient is high-risk. This recommendation follows the consensus statement: Guidelines for Management of Incidental Pulmonary Nodules Detected on CT Images: From the Fleischner Society 2017; Radiology 2017; 284:228-243. 3. Cholelithiasis. No evidence for gallbladder distention or inflammation. These results were called by telephone at the time of interpretation on 10/20/2021 at 12:20 pm to provider Wynona Dove , who verbally acknowledged these results. Electronically Signed   By: Markus Daft M.D.   On: 10/20/2021 13:19    Labs:  Basic Metabolic Panel: Recent Labs  Lab 10/31/21 0720  NA 135  K 4.5  CL 99  CO2 27  GLUCOSE 172*  BUN 21  CREATININE 1.18  CALCIUM 9.2    CBC: No results for input(s): WBC, NEUTROABS, HGB, HCT, MCV, PLT in the last 168 hours.   CBG: Recent Labs  Lab 11/01/21 2057 11/02/21 0618 11/02/21 1142 11/02/21 1637 11/02/21 2220  GLUCAP 111* 143* 128* 122* 126*   Family history.  Mother with ovarian cancer Father with leukemia and CAD.  Negative for diabetes mellitus colon cancer or esophageal cancer  Brief HPI:   Kevin [name] is a 75 y.o. right-handed male with history of left bundle branch block status post pacemaker 2013 maintained on Plavix/nonischemic cardiomyopathy, chronic combined systolic diastolic congestive heart failure hyperlipidemia type 2  diabetes mellitus prostate cancer as well as memory loss maintained on Aricept and Namenda, quit smoking 37 years ago.  Per chart review lives with spouse.  Modified independent driving prior to admission.  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  Presented 10/20/2021 after mechanical fall without loss of consciousness.  He denied any chest pain or shortness of breath associated with the fall.  Cranial CT scan and cervical spine films negative.  CT of the right hip as well as CT angiography of abdomen and pelvis showed mildly displaced right superior and inferior pubic ramus fracture.  There was also a large hematoma within the right hemipelvis with adjacent fluid stranding measuring 10.9 x 5.8 x 8.3 cm.  As well as 2 foci of active bleeding in the right hemipelvis large focus of bleeding along the right anterior aspect of the prostate.  Small focus of bleeding near the right superior pubic ramus.  There was also areas of bleeding appeared to be supplied by a corona mortise branch that originates from the distal right external iliac artery.  Incidental findings of nodules left lung base largest measuring 3 mm no follow-up was needed patient considered low risk as no known or suspected primary neoplasm.  Patient underwent coil embolization of right pectineal branch artery per interventional radiology.  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  Orthopedic follow-up in regards to pubic ramus fracture weightbearing as tolerated conservative care.  Hemoglobin remained stable at 10.4.  Patient with initial urinary retention possibly from pelvic hematoma Foley catheter tube removed voiding trial and monitored.  Renal ultrasound unremarkable.  Therapy evaluations completed due to patient decreased functional mobility was admitted for a comprehensive rehab program.   Hospital Course: Kevin [name] was admitted to rehab 10/24/2021 for inpatient therapies to consist  of PT, ST and OT at least three hours five days a week. Past admission physiatrist, therapy team and rehab RN have worked together to provide customized collaborative inpatient rehab.  Pertaining to patient's right superior inferior pubic ramus fracture pelvic hematoma.  Status post embolization Gelfoam embolization of replaced right obturator Coronas mortise artery.  Patient  would follow-up interventional radiology.  Weightbearing as tolerated regards to pelvic fracture.  SCDs for DVT prophylaxis chronic Plavix resumed 10/30/2021.  Pain management doing well with only Tylenol monitoring any cognitive deficits.  Mood stabilization with Aricept as well as Namenda he continued on Wellbutrin.  Mild dementia he was attending full therapies.  Acute blood loss anemia 11.7 no active bleeding episodes.  Lipitor ongoing for hyperlipidemia.  He exhibited no signs of fluid overload.  He did have a history of pacemaker for left bundle branch block no chest pain or shortness of breath follow-up outpatient.  Blood sugars controlled 128- 143 -111 and his home Synjardy remained on hold  hemoglobin A1c 6.7 currently on maintained on metformin.  Urinary retention BPH Foley tube removed he did require initial INO cath started on Flomax urinalysis negative Urecholine tapered off.  His wife is not willing to do in and out catheterizations thus a Foley catheter tube was placed and referral obtained with urology services outpatient.   Blood pressures were monitored on TID basis and controlled  Diabetes has been monitored with ac/hs CBG checks and SSI was use prn for tighter BS control.    Rehab course: During patient's stay in rehab weekly team conferences were held to monitor patient's progress, set goals and discuss barriers to discharge. At admission, patient required moderate assist step pivot transfers supervision sit to supine min mod assist 20 feet rolling walker  Physical exam.  Blood pressure 150/72 pulse 85 temperature  97.5 respirations 20 oxygen saturations 96% room air Constitutional.  No acute distress HEENT Head.  Normocephalic and atraumatic Eyes.  Pupils round and reactive to light no discharge without nystagmus Neck.  Supple nontender no JVD without thyromegaly Cardiac regular rate rhythm any extra sounds or murmur heard Abdomen.  Soft nontender positive bowel sounds without rebound Respiratory effort normal no respiratory distress without wheeze Skin.  Warm and dry Neurologic.  Cranial nerves II through XII intact, motor strength 5/5 in bilateral deltoid bicep tricep grip, 3 - right and 5/5 left hip flexor, knee extensors, 4/5 bilateral ankle dorsi plantarflexion.  He/She  has had improvement in activity tolerance, balance, postural control as well as ability to compensate for deficits. He/She has had improvement in functional use RUE/LUE  and RLE/LLE as well as improvement in awareness.  Supine to sit transfer with minimal assist and cues for position of right lower extremity.  Stand pivot transfer moderate assist to wheelchair.  Stand pivot to toilet with rolling walker minimal assist.  Ambulate 60 feet x 2 contact-guard assist.  Supine to sit edge of bed supervision using bed rails.  Supervision upper body bathing supervision lower body bathing independent upper body dressing minimal assist lower body dressing.  Full family teaching completed plan discharged to home       Disposition: Discharged to home    Diet: Diabetic diet  Special Instructions: No driving smoking or alcohol   Ambulatory referral obtained with urology services.  Medications at discharge 1.  Tylenol as needed 2.  Lipitor 40 mg daily 3.  Wellbutrin 150 mg nightly and 300 mg daily 4.  Vitamin D 1000 units daily 5.  Plavix 75 mg daily 6.  Aricept 5 mg p.o. daily 7  Ativan 0.5 mg every 8 hours as needed anxiety 8.  Namenda 10 mg p.o. twice daily 9.  Glucophage 250 mg p.o. twice daily 10.  MiraLAX twice daily hold for  loose stools  30-35 minutes were spent completing discharge summary and discharge planning  Discharge Instructions     Ambulatory referral to Urology   Complete by: As directed    Evaluate and treat BPH        Follow-up Information     Lovorn, Jinny Blossom, MD Follow up.   Specialty: Physical Medicine and Rehabilitation Why: No formal follow-up needed Contact information: [address] [place] [address] [phone]         Suzette Battiest, MD Follow up.   Specialties: Interventional Radiology, Diagnostic Radiology, Radiology Why: Call for appointment Contact information: [address] [place] [address] [phone]                 Signed: Lavon Paganini [place] 11/03/2021, 5:10 AM

## 2021-11-01 NOTE — Progress Notes (Deleted)
Phlebotomy contacted regarding blood culture orders. Awaiting blood draw

## 2021-11-01 NOTE — Progress Notes (Signed)
Patient cathed per order. Right after procedure, Wife of patient asked if he would need to be cathed at home. Provided verbal explanation to wife of what she had observed and encouraged her to call in the morning to find out cath schedule and participate in hands-on training.

## 2021-11-02 LAB — GLUCOSE, CAPILLARY
Glucose-Capillary: 143 mg/dL — ABNORMAL HIGH (ref 70–99)
Glucose-Capillary: 128 mg/dL — ABNORMAL HIGH (ref 70–99)
Glucose-Capillary: 122 mg/dL — ABNORMAL HIGH (ref 70–99)
Glucose-Capillary: 126 mg/dL — ABNORMAL HIGH (ref 70–99)

## 2021-11-02 MED ORDER — HYDROCODONE-ACETAMINOPHEN 5-325 MG PO TABS: 1.0000 | ORAL_TABLET | ORAL | 0 refills | Status: DC | PRN

## 2021-11-02 MED ORDER — LORAZEPAM 0.5 MG PO TABS: 0.5000 mg | ORAL_TABLET | Freq: Three times a day (TID) | ORAL | 0 refills | Status: DC | PRN

## 2021-11-02 MED ORDER — VITAMIN D3 25 MCG (1000 UNIT) PO TABS: 1000.0000 [IU] | ORAL_TABLET | Freq: Every day | ORAL | 0 refills | Status: DC

## 2021-11-02 MED ORDER — ACETAMINOPHEN 325 MG PO TABS: 650.0000 mg | ORAL_TABLET | Freq: Four times a day (QID) | ORAL | Status: DC | PRN

## 2021-11-02 MED ORDER — POLYETHYLENE GLYCOL 3350 17 G PO PACK: 17.0000 g | PACK | Freq: Two times a day (BID) | ORAL | 0 refills | Status: DC

## 2021-11-02 MED ORDER — CLOPIDOGREL BISULFATE 75 MG PO TABS: 75.0000 mg | ORAL_TABLET | Freq: Every day | ORAL | 0 refills | Status: DC

## 2021-11-02 MED ORDER — ATORVASTATIN CALCIUM 80 MG PO TABS: 40.0000 mg | ORAL_TABLET | Freq: Every evening | ORAL | 0 refills | Status: DC

## 2021-11-02 MED ORDER — BETHANECHOL CHLORIDE 25 MG PO TABS
50.0000 mg | ORAL_TABLET | Freq: Four times a day (QID) | ORAL | Status: DC
  Administered 2021-11-02 – 2021-11-03 (×3): 50 mg via ORAL
  Filled 2021-11-02 (×3): qty 2

## 2021-11-02 NOTE — Progress Notes (Signed)
PROGRESS NOTE   Subjective/Complaints: When asked if he has any concerns he jokingly asks for a new brain Required cath x2, Is voiding on own, concern for wife being able to cath at home  ROS: Patient denies fever, rash, sore throat, blurred vision, nausea, vomiting, diarrhea, cough, shortness of breath or chest pain,  headache, or mood change. +urinary retention     Objective:   No results found. No results for input(s): WBC, HGB, HCT, PLT in the last 72 hours.  Recent Labs    10/31/21 0720  NA 135  K 4.5  CL 99  CO2 27  GLUCOSE 172*  BUN 21  CREATININE 1.18  CALCIUM 9.2     Intake/Output Summary (Last 24 hours) at 11/02/2021 1204 Last data filed at 11/02/2021 1000 Gross per 24 hour  Intake 600 ml  Output 1225 ml  Net -625 ml        Physical Exam: Vital Signs Blood pressure 129/72, pulse 93, temperature 98.2 F (36.8 C), temperature source Oral, resp. rate 18, height 6\' 4"  (1.93 m), weight 67.6 kg, SpO2 94 %.  Gen: no distress, normal appearing HEENT: oral mucosa pink and moist, NCAT Cardio: Reg rate Chest: normal effort, normal rate of breathing Abd: soft, non-distended Ext: no edema Psych: pleasant, normal affect Skin: No evidence of breakdown, no evidence of rash.  A few bruises right arm,hip are present, buttock irritation? Neurologic: STM deficits. More alert and improved insight.  Cranial nerves II through XII intact, motor strength is 5/5 in bilateral deltoid, bicep, tricep, grip.  3- to 3/5 RIght lower ext prox to 4/5 distal, 5/5 Left hip flexor, knee extensors, ankle dorsiflexor and plantar flexor Reasonable standing balance once again. Musculoskeletal: Full range of motion in all 4 extremities. No joint swelling   Assessment/Plan: 1. Functional deficits which require 3+ hours per day of interdisciplinary therapy in a comprehensive inpatient rehab setting. Physiatrist is providing close team  supervision and 24 hour management of active medical problems listed below. Physiatrist and rehab team continue to assess barriers to discharge/monitor patient progress toward functional and medical goals  Care Tool:  Bathing    Body parts bathed by patient: Right arm, Left arm, Chest, Abdomen, Front perineal area, Buttocks, Right upper leg, Left upper leg, Face, Right lower leg, Left lower leg   Body parts bathed by helper: Right lower leg, Left lower leg     Bathing assist Assist Level: Supervision/Verbal cueing     Upper Body Dressing/Undressing Upper body dressing   What is the patient wearing?: Pull over shirt    Upper body assist Assist Level: Independent    Lower Body Dressing/Undressing Lower body dressing      What is the patient wearing?: Underwear/pull up, Pants     Lower body assist Assist for lower body dressing: Minimal Assistance - Patient > 75%     Toileting Toileting Toileting Activity did not occur (Clothing management and hygiene only): N/A (no void or bm)  Toileting assist Assist for toileting: Supervision/Verbal cueing     Transfers Chair/bed transfer  Transfers assist     Chair/bed transfer assist level: Contact Guard/Touching assist Chair/bed transfer assistive device: Armrests, Environmental consultant  Locomotion Ambulation   Ambulation assist      Assist level: Contact Guard/Touching assist Assistive device: Walker-rolling Max distance: 100'   Walk 10 feet activity   Assist     Assist level: Contact Guard/Touching assist Assistive device: Walker-rolling   Walk 50 feet activity   Assist Walk 50 feet with 2 turns activity did not occur: Safety/medical concerns  Assist level: Contact Guard/Touching assist Assistive device: Walker-rolling    Walk 150 feet activity   Assist Walk 150 feet activity did not occur: Safety/medical concerns         Walk 10 feet on uneven surface  activity   Assist Walk 10 feet on uneven surfaces  activity did not occur: Safety/medical concerns         Wheelchair     Assist Is the patient using a wheelchair?: Yes Type of Wheelchair: Manual    Wheelchair assist level: Dependent - Patient 0%      Wheelchair 50 feet with 2 turns activity    Assist        Assist Level: Dependent - Patient 0%   Wheelchair 150 feet activity     Assist      Assist Level: Dependent - Patient 0%   Blood pressure 129/72, pulse 93, temperature 98.2 F (36.8 C), temperature source Oral, resp. rate 18, height 6\' 4"  (1.93 m), weight 67.6 kg, SpO2 94 %.  Medical Problem List and Plan: 1. Functional deficits secondary to fall sustaining right superior inferior pubic ramus fracture/pelvic hematoma.  Status postembolization of right pectineal branch artery.  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  Weightbearing as tolerated.             -patient may  shower             -ELOS/Goals: 12/27 , sup/mod I  Continue CIR therapies including PT, OT, and SLP   2.  Antithrombotics: -DVT/anticoagulation:  Mechanical: Antiembolism stockings, thigh (TED hose) Bilateral lower extremities             -antiplatelet therapy: Resume Plavix 75 mg daily 10/30/2021 3. Pain Management: only tylenol for pain d//t cognitive side effects from narcotics, etc.  -trying air mattress also to help with pelvic pain/skin  -helped to reposition patient with pillows.  4. Dementia: continue Aricept 5 mg daily, Wellbutrin 150 mg daily, 300 mg daily, Namenda 10 mg twice daily, Ativan every 8 hours as needed             -antipsychotic agents: N/A 5. Neuropsych: This patient is capable of making decisions on his own behalf.  Mild dementia, forgetful but can make decisions with assistance 12/23 improved to baseline? 6. Skin/Wound Care: Routine skin checks 7. Fluids/Electrolytes/Nutrition: encourage PO, hd been losing weight PTA  -protein supp for low albumin  -12/24 I personally  reviewed all of the patient's labs today, and lab work is within normal limits.  8.  Acute blood loss anemia.  hgb 11.7 12/19 9.  Hyperlipidemia.  Lipitor 10.  Chronic combined systolic diastolic congestive heart failure.  Monitor for any signs of fluid overload   Filed Weights   10/31/21 0501 11/01/21 0528 11/02/21 0505  Weight: 75 kg 67.6 kg 67.6 kg    12/20- weight dropped 6 kg- which is unlikely- will having nursing recheck  12/24 weight up to 75kg but weights inconsistent, doesn't look overloaded. Continue to monitor  -5L since admit 12/26: weight stable 11.  History of left bundle branch block.  Status post  pacemaker 2013.  Follow-up outpatient 12.  Type 2 diabetes mellitus.  Hemoglobin A1c 6.7.  SSI.  Patient on Synjardy XR 2 tabs daily prior to admission.   CBG (last 3)  Recent Labs    11/01/21 2057 11/02/21 0618 11/02/21 1142  GLUCAP 111* 143* 128*   12/19 sub-optimal control. Resume only metformin from synjardy for now, 250mg  bid  12/24- CBGs under reasonable control  13.  Urinary retention/BPH.  Urinary tension possibly from pelvic hematoma.  Foley catheter tube removed.    -continue voiding trial   -has required 2 I/O caths for 500cc, bloody urine  -begin flomax 0.4mg  qpm  - ucx negative -12/24 urecholine  increased 25mg   qid 12/23  -may have to go home doing caths  -stand at toilet when possible to empty 12/26: increase urecholine to 50mg  QID, initiate education of wife to perform caths 14. Constipation  12/24 had large bm after sorbitol yesterday   -increase miralax to bid    LOS: 9 days A FACE TO FACE EVALUATION WAS PERFORMED  Martha Clan P Oceanna Arruda 11/02/2021, 12:04 PM

## 2021-11-02 NOTE — Progress Notes (Signed)
Slept in between shift.Repositioned for comfort.  Catheterized twice this shift and documented. Appeared confused at times.Safety maintained

## 2021-11-02 NOTE — Progress Notes (Signed)
Physical Therapy Discharge Summary  Patient Details  Name: Kevin [name] MRN: [id number] Date of Birth: 02-03-1946   Patient has met 9 of 9 long term goals due to improved activity tolerance, improved balance, increased strength, and decreased pain.  Patient to discharge at an ambulatory level Supervision with RW.   Patient's care partner is independent to provide the necessary physical and cognitive assistance at discharge and has been greatly involved in patient's POC during his CIR stay.  Recommendation:  Patient will benefit from ongoing skilled PT services in home health setting to continue to advance safe functional mobility, address ongoing impairments in RLE weakness, cognition, global deconditioning, safety awareness and minimize fall risk.  PT/OT Recommendations for going up and down steps at home   Go up stairs leading with your left leg  Go down stairs leading with your right leg  When going up curb, place walker on curb first and then step up with left leg When going down curb, place walker off curb first and then step down with right leg  Do not go up or down steps alone- always have someone with you!!   Equipment: RW  Reasons for discharge: treatment goals met and discharge from hospital  Patient/family agrees with progress made and goals achieved: Yes  PT Discharge Precautions/Restrictions Precautions Precautions: Fall Precaution Comments: pacemaker Restrictions Weight Bearing Restrictions: No RLE Weight Bearing: Weight bearing as tolerated Vital Signs Therapy Vitals Temp: 98.2 F (36.8 C) Temp Source: Oral Pulse Rate: 93 Resp: 18 BP: 129/72 Patient Position (if appropriate): Lying Oxygen Therapy SpO2: 94 % O2 Device: Room Air Pain Pain Assessment Pain Scale: 0-10 Pain Score: 4  Pain Type: Acute pain Pain Location: Back Pain Orientation: Left;Right Pain Descriptors / Indicators: Dull Pain Intervention(s): Medication (See eMAR) Pain  Interference Pain Interference Pain Effect on Sleep: 1. Rarely or not at all Pain Interference with Therapy Activities: 1. Rarely or not at all Pain Interference with Day-to-Day Activities: 1. Rarely or not at all Vision/Perception  Vision - History Ability to See in Adequate Light: 1 Impaired Perception Perception: Within Functional Limits Praxis Praxis: Intact  Cognition Overall Cognitive Status: Impaired/Different from baseline Arousal/Alertness: Lethargic Orientation Level: Oriented to person;Oriented to place;Oriented to situation Safety/Judgment: Impaired Comments: Per wife, Alzheimer's at baseline Sensation Sensation Light Touch: Appears Intact Coordination Gross Motor Movements are Fluid and Coordinated: No Coordination and Movement Description: Affecte dy dizziness, pain and fatigue Finger Nose Finger Test: WNL bilaterally Heel Shin Test: [facility] Motor  Motor Motor: Within Functional Limits Motor - Discharge Observations: Global weakness  Mobility Bed Mobility Bed Mobility: Rolling Right;Rolling Left;Supine to Sit;Sit to Supine Rolling Right: Independent with assistive device Rolling Left: Independent with assistive device Supine to Sit: Independent with assistive device Sit to Supine: Independent with assistive device Transfers Transfers: Sit to Stand;Stand Pivot Transfers;Stand to Sit Sit to Stand: Supervision/Verbal cueing Stand to Sit: Supervision/Verbal cueing Stand Pivot Transfers: Supervision/Verbal cueing Stand Pivot Transfer Details: Verbal cues for technique;Verbal cues for safe use of DME/AE Transfer (Assistive device): Rolling walker Locomotion  Gait Ambulation: Yes Gait Assistance: Supervision/Verbal cueing Gait Distance (Feet): 177 Feet Assistive device: Rolling walker Gait Assistance Details: Verbal cues for technique;Verbal cues for safe use of DME/AE Gait Assistance Details: Verbal cues for forward gaze and upright posture Gait Gait:  Yes Gait Pattern: Impaired Gait Pattern: Left flexed knee in stance;Right flexed knee in stance;Antalgic;Decreased weight shift to right;Decreased stance time - left;Decreased step length - right;Narrow base of support Gait velocity: decreased Stairs /  Additional Locomotion Stairs: Yes Stairs Assistance: Contact Guard/Touching assist Stair Management Technique: Two rails Number of Stairs: 4 Height of Stairs: 6 Ramp: Minimal Assistance - Patient >75% Curb: Contact Guard/Touching assist (w/RW) Pick up small object from the floor assist level: Dependent - Patient 0% Pick up small object from the floor assistive device: RW Wheelchair Mobility Wheelchair Mobility: No  Trunk/Postural Assessment  Cervical Assessment Cervical Assessment: Exceptions to [facility] (Forward head) Thoracic Assessment Thoracic Assessment: Exceptions to [facility] (Kyphotic) Lumbar Assessment Lumbar Assessment: Exceptions to [facility] (Posterior pelvic tilt) Postural Control Postural Control: Deficits on evaluation Righting Reactions: Decreased Protective Responses: Decreased  Balance Balance Balance Assessed: Yes Static Sitting Balance Static Sitting - Balance Support: Feet supported;Bilateral upper extremity supported Static Sitting - Level of Assistance: 7: Independent Dynamic Sitting Balance Dynamic Sitting - Balance Support: During functional activity;Feet supported Dynamic Sitting - Level of Assistance: 6: Modified independent (Device/Increase time) Dynamic Sitting - Balance Activities: Reaching across midline;Lateral lean/weight shifting;Reaching for weighted objects;Forward lean/weight shifting Static Standing Balance Static Standing - Balance Support: During functional activity;Bilateral upper extremity supported Static Standing - Level of Assistance: 5: Stand by assistance Dynamic Standing Balance Dynamic Standing - Balance Support: During functional activity;Bilateral upper extremity supported Dynamic Standing -  Level of Assistance: 5: Stand by assistance Dynamic Standing - Balance Activities: Lateral lean/weight shifting;Forward lean/weight shifting;Reaching across midline;Reaching for objects Extremity Assessment  RLE Assessment RLE Assessment: Exceptions to [facility] RLE Strength RLE Overall Strength: Deficits;Due to pain;Due to impaired cognition Right Hip Flexion: 4-/5 Right Hip Extension: 4/5 Right Hip ABduction: 4+/5 Right Hip ADduction: 4+/5 Right Knee Flexion: 4+/5 Right Knee Extension: 4+/5 Right Ankle Dorsiflexion: 4+/5 Right Ankle Plantar Flexion: 4+/5 LLE Assessment LLE Assessment: Exceptions to [facility] General Strength Comments: Grossly 4-4+/5 LLE Strength LLE Overall Strength: Due to impaired cognition;Within Functional Limits for tasks assessed LLE Overall Strength Comments: Difficult to assess due to poor understanding of instructions Left Hip Flexion: 4/5 Left Hip Extension: 4/5 Left Hip ABduction: 4/5 Left Hip ADduction: 4+/5 Left Knee Flexion: 4+/5 Left Knee Extension: 4+/5 Left Ankle Dorsiflexion: 4/5 Left Ankle Plantar Flexion: 4/5  Avalie Oconnor E Leticia Mcdiarmid, PT, DPT 11/03/2021, 8:37 AM

## 2021-11-02 NOTE — Progress Notes (Signed)
Physical Therapy Session Note  Patient Details  Name: Kevin [name] MRN: [id number] Date of Birth: 12-04-1945  Today's Date: 11/02/2021 PT Individual Time: 0800-0925 PT Individual Time Calculation (min): 85 min   Short Term Goals: Week 1:  PT Short Term Goal 1 (Week 1): Pt will perform bed mobility with supervision PT Short Term Goal 2 (Week 1): Pt will perform transfers with CGA PT Short Term Goal 3 (Week 1): Pt will gait x 50' with CGA PT Short Term Goal 4 (Week 1): Pt will perform stair negotiation with min assist  Skilled Therapeutic Interventions/Progress Updates:  Pt received supine in bed asleep, very lethargic and frequently falling asleep mid-sentence despite noxious stimuli. Pt appeared more confused today, frequently arguing w/therapist regarding where he was, clothes being inside out and believing he had a catheter in place, which he did not. Attempted to reorient pt gently to reduce risk of agitation and refusal. Pt reported 6/10 pain in R pelvis and nursing present to administer pain meds. Offered repositioning and distraction throughout session for pain modulation. Emphasis of session on stair training, gait training and preparation to DC home.   Pt donned pants at EOB w/min A to thread RLE through. Sit <>stand from elevated EOB w/S*, noted poor hand placement on RW despite verbal cues. Pt donned pants w/S* and ambulated 5' to [facility] w/180 degree turn w/RW and S*. Stand <>sit w/S* and poor eccentric control due to poor hand placement despite cues. Pt transported to main gym w/total A for energy conservation and time management. Sit <>stand from [facility] to RW w/S* w/improved hand placement on WC and pt ascended/descended 4 steps w/BUE support on rails, step-to pattern and CGA. Provided visual and verbal demonstration of proper technique, which pt able to teach back and demonstrate. Provided print out of proper technique in room for pt and wife to refer to at home.   Educated pt on  ascending/descending curb w/RW to practice safely getting into/out of garage at home. Provided visual and verbal demonstration of forward technique and pt able to teach back and demonstrate x2 w/CGA. Provided print out of instructions for safe technique for pt and wife to refer to at home.   Pt transported to ortho gym w/total A for energy conservation and time management and performed car transfer w/S* and RW. Noted better hand placement and good control of RLE. Pt then ascended/descended ramp w/RW, single LOB noted to R side during ascent requiring mod A to prevent fall. Pt reported he was experiencing dizziness but was able to complete ramp w/CGA overall. Mod verbal cues to maintain safe distance to RW as pt tends to demonstrate forward flexed posture and pushes the RW away from him.   Sit <>stand from [facility] w/S* and pt ambulated 177' w/RW and S*. Noted significant forward trunk lean, maintained knee flexion in stance bilaterally, antalgic gait pattern and sustained downward gaze. Min verbal cues for upright posture and to maintain safe distance to AD. Pt transported back to room w/total A 2/2 fatigue.   Pt performed sit <>stand pivot from WC to recliner in room w/S* and RW. Max encouragement to stay seated in recliner to improve endurance and stamina, pt verbalized agreement. Provided HEP (see below) and print out of instructions for ascending/descending curb w/RW and steps since wife was not present for session (see DC note). Pt declined demonstrating HEP, so provided verbal and visual instruction. Pt was left seated in recliner in room, eating breakfast, all needs in reach.  Access Code: [email or web address] URL: [email or web address] Date: 11/02/2021 Prepared by: Mickie Bail Jun Osment  Exercises Walking - 1 x daily - 7 x weekly - 10-20 minutes time Standing March with Counter Support - 1 x daily - 7 x weekly - 3 sets - 10 reps Standing Hip Abduction with Counter Support - 1 x daily - 7 x weekly - 3  sets - 10 reps Supine Bridge - 1 x daily - 7 x weekly - 3 sets - 10 reps Heel Raises with Counter Support - 1 x daily - 7 x weekly - 3 sets - 10 reps Mini Squat with Counter Support - 1 x daily - 7 x weekly - 3 sets - 10 reps   Therapy Documentation Precautions:  Precautions Precautions: Fall Precaution Comments: pacemaker Restrictions Weight Bearing Restrictions: No RLE Weight Bearing: Weight bearing as tolerated   Therapy/Group: Individual Therapy Cruzita Lederer [name], PT, DPT  11/02/2021, 7:48 AM

## 2021-11-02 NOTE — Progress Notes (Signed)
Occupational Therapy Session Note  Patient Details  Name: Kevin [name] MRN: [id number] Date of Birth: 1946-06-16  Today's Date: 11/02/2021 OT Individual Time: 8768-1157 OT Individual Time Calculation (min): 55 min    Short Term Goals: Week 1:  OT Short Term Goal 1 (Week 1): Pt will complete 1/3 components of toileting with CGA for balance OT Short Term Goal 2 (Week 1): Pt will complete LB self care at sit<stand level with no more than CGA for balance assistance OT Short Term Goal 3 (Week 1): Pt will thread 1 LE into pants with supervision using AE as needed  Skilled Therapeutic Interventions/Progress Updates:    Patient participated in skill OTas follows: bed moblitiy=distant S;    bathing and dressing= setup and 3 short rest breaks.    Patient complained of initial dizziness after initial sit to stand from edge of bed to prepare to utilize walker to complete shower transfer from bed via walker.    He was able to clear and continued at [facility] due to his complaint of dizziness.   As well when returning from bathroom when patient navigated the bathroom threshold decline.   He utlized good safety awareness when he stated, "I know this floor changes right here at the doorway; so, since I need to go slow to make sure I do not get dizzy agai." This clinician stayed close just as an extra precaution since patient c/o of dizziness.  Toileting and transfer=S  Patient completed sit to stand for shower for periarea bathing without complaints of dizziness.   He touched and lightly held onto grab bar for about 2 seconds to stablize his balance initally.   Continuing with Plan of Care until patient discharges from facility.  Therapy Documentation Precautions:  Precautions Precautions: Fall Precaution Comments: pacemaker Restrictions Weight Bearing Restrictions: No RLE Weight Bearing: Weight bearing as tolerated  Pain:delete     Therapy/Group: Individual Therapy  Herschell Dimes 11/02/2021, 2:32 PM

## 2021-11-02 NOTE — Progress Notes (Signed)
Inpatient Rehabilitation Discharge Medication Review by a Pharmacist  A complete drug regimen review was completed for this patient to identify any potential clinically significant medication issues.  High Risk Drug Classes Is patient taking? Indication by Medication  Antipsychotic No   Anticoagulant No   Antibiotic No   Opioid Yes Vicodin for moderate pain  Antiplatelet Yes   Hypoglycemics/insulin Yes Synjardy XR for DM  Vasoactive Medication No   Chemotherapy No   Other Yes Wellbutrin for mood Aricept/Namenda for dementia Atorvastatin for HLD Lorazepam as needed for anxiety     Type of Medication Issue Identified Description of Issue Recommendation(s)  Drug Interaction(s) (clinically significant)     Duplicate Therapy     Allergy     No Medication Administration End Date     Incorrect Dose     Additional Drug Therapy Needed     Significant med changes from prior encounter (inform family/care partners about these prior to discharge).    Other       Clinically significant medication issues were identified that warrant physician communication and completion of prescribed/recommended actions by midnight of the next day:  No  Pharmacist comments: Discharge DM regimen to be determined (metformin or Synjardy combination?)  Time spent performing this drug regimen review (minutes):  20 minutes   Tad Moore 11/02/2021 9:43 AM

## 2021-11-02 NOTE — Plan of Care (Signed)
Problem: RH Balance Goal: LTG Patient will maintain dynamic standing balance (PT) Description: LTG:  Patient will maintain dynamic standing balance with assistance during mobility activities (PT) Outcome: Completed/Met   Problem: Sit to Stand Goal: LTG:  Patient will perform sit to stand with assistance level (PT) Description: LTG:  Patient will perform sit to stand with assistance level (PT) Outcome: Completed/Met Flowsheets (Taken 10/25/2021 1347 by [name], PT) LTG: PT will perform sit to stand in preparation for functional mobility with assistance level: Supervision/Verbal cueing   Problem: RH Bed Mobility Goal: LTG Patient will perform bed mobility with assist (PT) Description: LTG: Patient will perform bed mobility with assistance, with/without cues (PT). Outcome: Completed/Met   Problem: RH Bed to Chair Transfers Goal: LTG Patient will perform bed/chair transfers w/assist (PT) Description: LTG: Patient will perform bed to chair transfers with assistance (PT). Outcome: Completed/Met   Problem: RH Car Transfers Goal: LTG Patient will perform car transfers with assist (PT) Description: LTG: Patient will perform car transfers with assistance (PT). Outcome: Completed/Met   Problem: RH Ambulation Goal: LTG Patient will ambulate in controlled environment (PT) Description: LTG: Patient will ambulate in a controlled environment, # of feet with assistance (PT). Outcome: Completed/Met Goal: LTG Patient will ambulate in home environment (PT) Description: LTG: Patient will ambulate in home environment, # of feet with assistance (PT). Outcome: Completed/Met   Problem: RH Stairs Goal: LTG Patient will ambulate up and down stairs w/assist (PT) Description: LTG: Patient will ambulate up and down # of stairs with assistance (PT) Outcome: Completed/Met

## 2021-11-02 NOTE — Progress Notes (Signed)
Occupational Therapy Session Note  Patient Details  Name: Kevin [name] MRN: [id number] Date of Birth: 07-16-1946  Today's Date: 11/02/2021 OT Individual Time: 1420-1530 OT Individual Time Calculation (min): 70 min    Short Term Goals: Week 1:  OT Short Term Goal 1 (Week 1): Pt will complete 1/3 components of toileting with CGA for balance OT Short Term Goal 2 (Week 1): Pt will complete LB self care at sit<stand level with no more than CGA for balance assistance OT Short Term Goal 3 (Week 1): Pt will thread 1 LE into pants with supervision using AE as needed  Skilled Therapeutic Interventions/Progress Updates:  Skilled OT intervention completed with focus on d/c planning, discussion of rehab goals, toileting tasks, functional transfers, activity and standing tolerance. Pt received seated in recliner, agreeable to session. Pt reporting the need to void, with pt sit > stand using RW with CGA then pt able to doff front part of clothes with supervision for urinal placement. Pt unable to successful void despite pt attempting to void sitting down. Ambulatory transfer to w/c using RW and CGA., then total A transport to therapy gym. While standing using RW and close supervision for balance, pt participated in [place] and word search tasks for activity tolerance and dynamic balance needed for self-care tasks. Pt with mod difficulty with locating pathway through maze, but able to self-correct if given directional hints. Increased difficulty with word search, as pt with minimal understanding of sequencing which way words connect with increased environmental distractions further limiting pt. Pt required intermittent seated rest breaks, with pt preferring to stay seated for word search- with 1 pound wrist weights applied to BUE to promote BUE endurance during seated task. Transported pt with total A in w/c back to room, then sit > stand with CGA using RW and ambulatory transfer to recliner with CGA.  Pt left seated in recliner, with belt alarm on, wife in room and all needs in reach at end of session.  Therapy Documentation Precautions:  Precautions Precautions: Fall Precaution Comments: pacemaker Restrictions Weight Bearing Restrictions: No RLE Weight Bearing: Weight bearing as tolerated  Pain: No c/o pain    Therapy/Group: Individual Therapy  Vetra Shinall E Alejandra Hunt 11/02/2021, 7:52 AM

## 2021-11-03 ENCOUNTER — Telehealth: Payer: Self-pay

## 2021-11-03 DIAGNOSIS — S32599S Other specified fracture of unspecified pubis, sequela: Secondary | ICD-10-CM

## 2021-11-03 DIAGNOSIS — E440 Moderate protein-calorie malnutrition: Secondary | ICD-10-CM

## 2021-11-03 LAB — GLUCOSE, CAPILLARY
Glucose-Capillary: 122 mg/dL — ABNORMAL HIGH (ref 70–99)
Glucose-Capillary: 113 mg/dL — ABNORMAL HIGH (ref 70–99)

## 2021-11-03 MED ORDER — BETHANECHOL CHLORIDE 50 MG PO TABS: 25.0000 mg | ORAL_TABLET | Freq: Four times a day (QID) | ORAL | 0 refills | Status: DC

## 2021-11-03 MED ORDER — METFORMIN HCL 500 MG PO TABS: 250.0000 mg | ORAL_TABLET | Freq: Two times a day (BID) | ORAL | 0 refills | Status: DC

## 2021-11-03 NOTE — Plan of Care (Signed)
Problem: RH Memory Goal: LTG Patient will demonstrate ability for day to day recall/carry over during activities of daily living with assistance level (PT) Description: LTG:  Patient will demonstrate ability for day to day recall/carry over during activities of daily living with assistance level (PT). Outcome: Completed/Met

## 2021-11-03 NOTE — Progress Notes (Signed)
Occupational Therapy Discharge Summary  Patient Details  Name: Kevin [name] MRN: [id number] Date of Birth: [birth year]   Patient has met 8 of 8 long term goals due to improved activity tolerance, improved balance, and ability to compensate for deficits.  Patient to discharge at overall Supervision level.  Patient's care partner is independent to provide the necessary physical and cognitive assistance at discharge.    [name] did not attend formal family education but did come to pt's last OT session to observe and I spoke with her via phone about the equipment recommendations and the need for 24/7 supervision due to his memory deficits, dizziness.  Reasons goals not met: n/a  Recommendation:  Patient will benefit from ongoing skilled OT services in home health setting to continue to advance functional skills in the area of BADL and iADL.  Equipment: No equipment provided - recommended to wife to purchase padded BSC and shower chair with handles  Reasons for discharge: treatment goals met  Patient/family agrees with progress made and goals achieved: Yes  OT Discharge Precautions/Restrictions  Precautions Precautions: Fall Precaution Comments: pacemaker Restrictions RLE Weight Bearing: Weight bearing as tolerated  ADL ADL Eating: Independent Grooming: Setup Where Assessed-Grooming: Edge of bed Upper Body Bathing: Supervision/safety Where Assessed-Upper Body Bathing: Shower Lower Body Bathing: Supervision/safety Where Assessed-Lower Body Bathing: Shower Upper Body Dressing: Independent Where Assessed-Upper Body Dressing: Chair Lower Body Dressing: Supervision/safety Where Assessed-Lower Body Dressing: Chair Toileting: Supervision/safety Where Assessed-Toileting: Glass blower/designer: Close supervision Toilet Transfer Method: Arts development officer: Raised toilet seat, Energy manager: Not assessed Social research officer, government: Close  supervision Social research officer, government Method: Ambulating, Stand pivot [employer]: Radio broadcast assistant, Grab bars Vision Baseline Vision/History: 1 Wears glasses Patient Visual Report: Nausea/blurring vision with head movement;Diplopia Vision Assessment?: No apparent visual deficits Perception  Perception: Within Functional Limits Praxis Praxis: Intact Cognition Overall Cognitive Status: Impaired/Different from baseline Orientation Level: Oriented X4 Year: 2022 Month: December Day of Week: Correct Memory: Impaired Immediate Memory Recall: Sock;Bed;Blue Memory Recall Sock: Without Cue Memory Recall Blue: Without Cue Memory Recall Bed: With Cue Awareness: Impaired Awareness Impairment: Intellectual impairment;Emergent impairment Problem Solving: Impaired Problem Solving Impairment: Functional basic Safety/Judgment: Impaired Comments: Per wife, Alzheimer's at baseline Sensation Sensation Light Touch: Appears Intact Hot/Cold: Appears Intact Proprioception: Appears Intact Stereognosis: Appears Intact Coordination Gross Motor Movements are Fluid and Coordinated: No Fine Motor Movements are Fluid and Coordinated: Yes Coordination and Movement Description: Affected dy dizziness, pain and fatigue Finger Nose Finger Test: WNL bilaterally Heel Shin Test: [facility] Motor  Motor Motor - Discharge Observations: Global weakness Mobility  Transfers Stand to Sit: Supervision/Verbal cueing  Trunk/Postural Assessment  Postural Control Righting Reactions: Decreased Protective Responses: Decreased  Balance Dynamic Sitting Balance Dynamic Sitting - Level of Assistance: 6: Modified independent (Device/Increase time) Static Standing Balance Static Standing - Level of Assistance: 5: Stand by assistance Dynamic Standing Balance Dynamic Standing - Level of Assistance: 5: Stand by assistance Extremity/Trunk Assessment RUE Assessment Active Range of Motion (AROM) Comments: WNL LUE  Assessment Active Range of Motion (AROM) Comments: WNL   March Steyer 11/03/2021, 9:39 AM

## 2021-11-03 NOTE — Progress Notes (Signed)
Patient ID: Kevin [name], male   DOB: 1945/12/20, 75 y.o.   MRN: [id number] Wife is tried of waiting for rolling walker and has decided to take him home and get a neighbor to help her get him into their home. Have cancelled the rolling walker from [employer]. Wife is very displeased with how today and husband's stay has gone while here. Home health has called her and will start tomorrow. She is aware to call this worker with further questions.

## 2021-11-03 NOTE — Telephone Encounter (Signed)
[facility] requesting a 90 day supply of Metformin &  Atorvastatin.

## 2021-11-03 NOTE — Progress Notes (Signed)
Patient discharged to home with wife. Education provided on foley care. Wife refused to wait for walker. She said she was going to call someone to help her get the patient inside the house. Discharge instructions reviewed by Linna Hoff PA. All questions answered.   Alonza Bogus LPN

## 2021-11-03 NOTE — Progress Notes (Signed)
Inserted indwelling catheter in patient 16 french. Patient's wife was present. Foley care and peri care education was provided for patient. Patient's wife was receptive with teaching. Wife demonstrated understanding of how to empty and maintain foley.    [place] LPN

## 2021-11-03 NOTE — Consult Note (Signed)
° °  [facility] Inpatient Consult   11/03/2021  [name] September 27, 1946 [id number]  [facility] [[facility]] Patient: Kevin [name]  Primary Care Provider:  [name], MD, Clover Mealy, is an embedded provider with a Chronic Care Management team and program, and is listed for the transition of care follow up and appointments.  Patient was screened for Embedded practice service needs for chronic care management is active with CCM pharmacist.  Plan: Notification sent to the [facility] Pharmacist at [facility] and made aware patient transition from inpatient rehab today. Please contact for further questions,  Natividad Brood, RN BSN [facility] Liaison  [phone] business mobile phone Toll free office [phone]  Fax number: [phone] [email or web address]

## 2021-11-03 NOTE — Progress Notes (Signed)
PROGRESS NOTE   Subjective/Complaints: Pt up in bed. No new complaints. Still req I/O caths.   ROS: Patient denies fever, rash, sore throat, blurred vision, nausea, vomiting, diarrhea, cough, shortness of breath or chest pain, headache, or mood change.      Objective:   No results found. No results for input(s): WBC, HGB, HCT, PLT in the last 72 hours.  No results for input(s): NA, K, CL, CO2, GLUCOSE, BUN, CREATININE, CALCIUM in the last 72 hours.    Intake/Output Summary (Last 24 hours) at 11/03/2021 0916 Last data filed at 11/03/2021 0500 Gross per 24 hour  Intake 480 ml  Output 1000 ml  Net -520 ml        Physical Exam: Vital Signs Blood pressure 110/67, pulse 81, temperature 97.7 F (36.5 C), temperature source Oral, resp. rate 16, height 6\' 4"  (1.93 m), weight 66.9 kg, SpO2 98 %.  Constitutional: No distress . Vital signs reviewed. HEENT: NCAT, EOMI, oral membranes moist Neck: supple Cardiovascular: RRR without murmur. No JVD    Respiratory/Chest: CTA Bilaterally without wheezes or rales. Normal effort    GI/Abdomen: BS +, non-tender, non-distended Ext: no clubbing, cyanosis, or edema Psych: pleasant and cooperative  Skin: No evidence of breakdown, no evidence of rash.  A few bruises right arm,hip are present, buttock irritation? Neurologic: STM deficits present, more insightful.  Cranial nerves II through XII intact, motor strength is 5/5 in bilateral deltoid, bicep, tricep, grip.  3- to 3/5 RIght lower ext prox to 4/5 distal, 5/5 Left hip flexor, knee extensors, ankle dorsiflexor and plantar flexor Reasonable standing balance once again. Musculoskeletal: Full range of motion in all 4 extremities. No joint swelling   Assessment/Plan: 1. Functional deficits which require 3+ hours per day of interdisciplinary therapy in a comprehensive inpatient rehab setting. Physiatrist is providing close team  supervision and 24 hour management of active medical problems listed below. Physiatrist and rehab team continue to assess barriers to discharge/monitor patient progress toward functional and medical goals  Care Tool:  Bathing    Body parts bathed by patient: Right arm, Right lower leg, Left arm, Left lower leg, Chest, Abdomen, Face, Front perineal area, Buttocks, Right upper leg, Left upper leg   Body parts bathed by helper: Right lower leg, Left lower leg     Bathing assist Assist Level: Supervision/Verbal cueing     Upper Body Dressing/Undressing Upper body dressing   What is the patient wearing?: Pull over shirt    Upper body assist Assist Level: Supervision/Verbal cueing    Lower Body Dressing/Undressing Lower body dressing      What is the patient wearing?: Underwear/pull up, Pants     Lower body assist Assist for lower body dressing: Supervision/Verbal cueing     Toileting Toileting Toileting Activity did not occur (Clothing management and hygiene only): N/A (no void or bm)  Toileting assist Assist for toileting: Supervision/Verbal cueing     Transfers Chair/bed transfer  Transfers assist     Chair/bed transfer assist level: Supervision/Verbal cueing Chair/bed transfer assistive device: Armrests, Programmer, multimedia   Ambulation assist      Assist level: Supervision/Verbal cueing Assistive device: Walker-rolling Max distance:  177'   Walk 10 feet activity   Assist     Assist level: Supervision/Verbal cueing Assistive device: Walker-rolling   Walk 50 feet activity   Assist Walk 50 feet with 2 turns activity did not occur: Safety/medical concerns  Assist level: Supervision/Verbal cueing Assistive device: Walker-rolling    Walk 150 feet activity   Assist Walk 150 feet activity did not occur: Safety/medical concerns  Assist level: Supervision/Verbal cueing Assistive device: Walker-rolling    Walk 10 feet on uneven  surface  activity   Assist Walk 10 feet on uneven surfaces activity did not occur: Safety/medical concerns   Assist level: Contact Guard/Touching assist Assistive device: Walker-rolling   Wheelchair     Assist Is the patient using a wheelchair?: No Type of Wheelchair: Manual    Wheelchair assist level: Dependent - Patient 0%      Wheelchair 50 feet with 2 turns activity    Assist        Assist Level: Dependent - Patient 0%   Wheelchair 150 feet activity     Assist      Assist Level: Dependent - Patient 0%   Blood pressure 110/67, pulse 81, temperature 97.7 F (36.5 C), temperature source Oral, resp. rate 16, height 6\' 4"  (1.93 m), weight 66.9 kg, SpO2 98 %.  Medical Problem List and Plan: 1. Functional deficits secondary to fall sustaining right superior inferior pubic ramus fracture/pelvic hematoma.  Status postembolization of right pectineal branch artery.  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  Weightbearing as tolerated.             -dc home today  -f/u with PCP, ortho, urology 2.  Antithrombotics: -DVT/anticoagulation:  Mechanical: Antiembolism stockings, thigh (TED hose) Bilateral lower extremities             -antiplatelet therapy: Resume Plavix 75 mg daily 10/30/2021 3. Pain Management: only tylenol for pain d//t cognitive side effects from narcotics, etc.  -trying air mattress also to help with pelvic pain/skin  -helped to reposition patient with pillows.  4. Dementia: continue Aricept 5 mg daily, Wellbutrin 150 mg daily, 300 mg daily, Namenda 10 mg twice daily, Ativan every 8 hours as needed             -antipsychotic agents: N/A 5. Neuropsych: This patient is capable of making decisions on his own behalf.  Mild dementia, forgetful but can make decisions with assistance 12/23 improved to baseline? 6. Skin/Wound Care: Routine skin checks 7. Fluids/Electrolytes/Nutrition: encourage PO, hd been losing weight  PTA  -protein supp for low albumin 8.  Acute blood loss anemia.  hgb 11.7 12/19 9.  Hyperlipidemia.  Lipitor 10.  Chronic combined systolic diastolic congestive heart failure.  Monitor for any signs of fluid overload   Filed Weights   11/01/21 0528 11/02/21 0505 11/03/21 0300  Weight: 67.6 kg 67.6 kg 66.9 kg    12/20- weight dropped 6 kg- which is unlikely- will having nursing recheck  12/24 weight up to 75kg but weights inconsistent, doesn't look overloaded. Continue to monitor  -5L since admit 12/27: some weight loss, will need to work on nutrition at home 11.  History of left bundle branch block.  Status post pacemaker 2013.  Follow-up outpatient 12.  Type 2 diabetes mellitus.  Hemoglobin A1c 6.7.  SSI.  Patient on Synjardy XR 2 tabs daily prior to admission.   CBG (last 3)  Recent Labs    11/02/21 1637 11/02/21 2220 11/03/21 0518  GLUCAP 122* 126*  122*   12/19 sub-optimal control. Resume only metformin from synjardy for now, 250mg  bid  12/27- CBGs under reasonable control  13.  Urinary retention/BPH.  Urinary tension possibly from pelvic hematoma.  Foley catheter tube removed.    -wife/pt unable to do I/O caths  -replace foley  -continue flomax, dc urecholine  -outpt urology 14. Constipation  12/24 had large bm after sorbitol yesterday   -  miralax  bid    LOS: 10 days A FACE TO FACE EVALUATION WAS PERFORMED  Meredith Staggers 11/03/2021, 9:16 AM

## 2021-11-04 ENCOUNTER — Telehealth: Payer: Self-pay | Admitting: Family Medicine

## 2021-11-04 NOTE — Telephone Encounter (Signed)
[facility] rn with [facility] is calling to report she just got off the phone with pt wife and pt wife said he has increase in blood in foley bag. Stanton Kidney told wife to take him to er

## 2021-11-04 NOTE — Telephone Encounter (Signed)
[name] with [facility] is calling to notify dr burchette pt was d/c from [place] yesterday. Pt has blood in foley bag and pt has an appt with urologist on 11-12-2021 . Pt has history of prostate cancer. Kevin [name] will see pt 1x6 for skilled nursing. Kevin [name] would like verbal order.Kevin [name] told pt wife to keep eye on foley bag

## 2021-11-04 NOTE — Telephone Encounter (Signed)
Verbal order given to Stanton Kidney will call if any questions.

## 2021-11-05 ENCOUNTER — Emergency Department (HOSPITAL_COMMUNITY): Payer: No Typology Code available for payment source

## 2021-11-05 ENCOUNTER — Telehealth: Payer: Self-pay

## 2021-11-05 ENCOUNTER — Observation Stay (HOSPITAL_COMMUNITY)
Admission: EM | Admit: 2021-11-05 | Discharge: 2021-11-10 | Disposition: A | Discharge status: Rehabilitation Facility | Payer: No Typology Code available for payment source | Attending: Internal Medicine | Admitting: Internal Medicine

## 2021-11-05 ENCOUNTER — Other Ambulatory Visit: Payer: Self-pay

## 2021-11-05 DIAGNOSIS — E1169 Type 2 diabetes mellitus with other specified complication: Secondary | ICD-10-CM

## 2021-11-05 DIAGNOSIS — M545 Low back pain, unspecified: Secondary | ICD-10-CM | POA: Diagnosis not present

## 2021-11-05 DIAGNOSIS — R310 Gross hematuria: Secondary | ICD-10-CM

## 2021-11-05 DIAGNOSIS — Z87891 Personal history of nicotine dependence: Secondary | ICD-10-CM | POA: Insufficient documentation

## 2021-11-05 DIAGNOSIS — N183 Chronic kidney disease, stage 3 unspecified: Secondary | ICD-10-CM | POA: Insufficient documentation

## 2021-11-05 DIAGNOSIS — W19XXXD Unspecified fall, subsequent encounter: Secondary | ICD-10-CM

## 2021-11-05 DIAGNOSIS — Y92002 Bathroom of unspecified non-institutional (private) residence single-family (private) house as the place of occurrence of the external cause: Secondary | ICD-10-CM | POA: Diagnosis not present

## 2021-11-05 DIAGNOSIS — Z79899 Other long term (current) drug therapy: Secondary | ICD-10-CM | POA: Insufficient documentation

## 2021-11-05 DIAGNOSIS — Z20822 Contact with and (suspected) exposure to covid-19: Secondary | ICD-10-CM | POA: Diagnosis not present

## 2021-11-05 DIAGNOSIS — W19XXXA Unspecified fall, initial encounter: Secondary | ICD-10-CM | POA: Diagnosis present

## 2021-11-05 DIAGNOSIS — Z7902 Long term (current) use of antithrombotics/antiplatelets: Secondary | ICD-10-CM | POA: Diagnosis not present

## 2021-11-05 DIAGNOSIS — S301XXA Contusion of abdominal wall, initial encounter: Secondary | ICD-10-CM | POA: Diagnosis not present

## 2021-11-05 DIAGNOSIS — N1831 Chronic kidney disease, stage 3a: Secondary | ICD-10-CM | POA: Insufficient documentation

## 2021-11-05 DIAGNOSIS — F419 Anxiety disorder, unspecified: Secondary | ICD-10-CM | POA: Diagnosis present

## 2021-11-05 DIAGNOSIS — Z8616 Personal history of COVID-19: Secondary | ICD-10-CM | POA: Diagnosis not present

## 2021-11-05 DIAGNOSIS — I5042 Chronic combined systolic (congestive) and diastolic (congestive) heart failure: Secondary | ICD-10-CM | POA: Diagnosis not present

## 2021-11-05 DIAGNOSIS — I6200 Nontraumatic subdural hemorrhage, unspecified: Secondary | ICD-10-CM | POA: Diagnosis not present

## 2021-11-05 DIAGNOSIS — S065XAA Traumatic subdural hemorrhage with loss of consciousness status unknown, initial encounter: Secondary | ICD-10-CM | POA: Diagnosis present

## 2021-11-05 DIAGNOSIS — S065X0A Traumatic subdural hemorrhage without loss of consciousness, initial encounter: Principal | ICD-10-CM | POA: Insufficient documentation

## 2021-11-05 DIAGNOSIS — Z7984 Long term (current) use of oral hypoglycemic drugs: Secondary | ICD-10-CM | POA: Diagnosis not present

## 2021-11-05 DIAGNOSIS — M47812 Spondylosis without myelopathy or radiculopathy, cervical region: Secondary | ICD-10-CM | POA: Diagnosis not present

## 2021-11-05 DIAGNOSIS — Z95810 Presence of automatic (implantable) cardiac defibrillator: Secondary | ICD-10-CM | POA: Diagnosis not present

## 2021-11-05 DIAGNOSIS — E119 Type 2 diabetes mellitus without complications: Secondary | ICD-10-CM | POA: Diagnosis not present

## 2021-11-05 DIAGNOSIS — W01198A Fall on same level from slipping, tripping and stumbling with subsequent striking against other object, initial encounter: Secondary | ICD-10-CM | POA: Diagnosis not present

## 2021-11-05 DIAGNOSIS — Z8546 Personal history of malignant neoplasm of prostate: Secondary | ICD-10-CM | POA: Diagnosis not present

## 2021-11-05 DIAGNOSIS — C61 Malignant neoplasm of prostate: Secondary | ICD-10-CM | POA: Insufficient documentation

## 2021-11-05 DIAGNOSIS — F0390 Unspecified dementia without behavioral disturbance: Secondary | ICD-10-CM | POA: Insufficient documentation

## 2021-11-05 DIAGNOSIS — E785 Hyperlipidemia, unspecified: Secondary | ICD-10-CM | POA: Diagnosis present

## 2021-11-05 DIAGNOSIS — M25551 Pain in right hip: Secondary | ICD-10-CM | POA: Diagnosis not present

## 2021-11-05 DIAGNOSIS — S0990XA Unspecified injury of head, initial encounter: Secondary | ICD-10-CM | POA: Diagnosis present

## 2021-11-05 DIAGNOSIS — I130 Hypertensive heart and chronic kidney disease with heart failure and stage 1 through stage 4 chronic kidney disease, or unspecified chronic kidney disease: Secondary | ICD-10-CM | POA: Insufficient documentation

## 2021-11-05 DIAGNOSIS — Z743 Need for continuous supervision: Secondary | ICD-10-CM | POA: Diagnosis not present

## 2021-11-05 DIAGNOSIS — J984 Other disorders of lung: Secondary | ICD-10-CM | POA: Diagnosis not present

## 2021-11-05 DIAGNOSIS — Z981 Arthrodesis status: Secondary | ICD-10-CM | POA: Diagnosis not present

## 2021-11-05 DIAGNOSIS — R0602 Shortness of breath: Secondary | ICD-10-CM | POA: Diagnosis not present

## 2021-11-05 DIAGNOSIS — I495 Sick sinus syndrome: Secondary | ICD-10-CM | POA: Diagnosis present

## 2021-11-05 DIAGNOSIS — M5136 Other intervertebral disc degeneration, lumbar region: Secondary | ICD-10-CM | POA: Diagnosis not present

## 2021-11-05 DIAGNOSIS — I10 Essential (primary) hypertension: Secondary | ICD-10-CM | POA: Diagnosis present

## 2021-11-05 DIAGNOSIS — R102 Pelvic and perineal pain: Secondary | ICD-10-CM | POA: Diagnosis not present

## 2021-11-05 DIAGNOSIS — S199XXA Unspecified injury of neck, initial encounter: Secondary | ICD-10-CM | POA: Diagnosis not present

## 2021-11-05 DIAGNOSIS — S32511A Fracture of superior rim of right pubis, initial encounter for closed fracture: Secondary | ICD-10-CM | POA: Diagnosis not present

## 2021-11-05 DIAGNOSIS — S3210XA Unspecified fracture of sacrum, initial encounter for closed fracture: Secondary | ICD-10-CM | POA: Diagnosis not present

## 2021-11-05 HISTORY — DX: Unspecified dementia, unspecified severity, without behavioral disturbance, psychotic disturbance, mood disturbance, and anxiety: F03.90

## 2021-11-05 LAB — COMPREHENSIVE METABOLIC PANEL
ALT: 11 U/L (ref 0–44)
AST: 19 U/L (ref 15–41)
Albumin: 3.3 g/dL — ABNORMAL LOW (ref 3.5–5.0)
Alkaline Phosphatase: 227 U/L — ABNORMAL HIGH (ref 38–126)
Anion gap: 8 (ref 5–15)
BUN: 25 mg/dL — ABNORMAL HIGH (ref 8–23)
CO2: 27 mmol/L (ref 22–32)
Calcium: 9 mg/dL (ref 8.9–10.3)
Chloride: 102 mmol/L (ref 98–111)
Creatinine, Ser: 1.26 mg/dL — ABNORMAL HIGH (ref 0.61–1.24)
GFR, Estimated: 59 mL/min — ABNORMAL LOW (ref >60)
Glucose, Bld: 110 mg/dL — ABNORMAL HIGH (ref 70–99)
Potassium: 4.3 mmol/L (ref 3.5–5.1)
Sodium: 137 mmol/L (ref 135–145)
Total Bilirubin: 1.4 mg/dL — ABNORMAL HIGH (ref 0.3–1.2)
Total Protein: 6.8 g/dL (ref 6.5–8.1)

## 2021-11-05 LAB — CBC WITH DIFFERENTIAL/PLATELET
Abs Immature Granulocytes: 0.06 K/uL (ref 0.00–0.07)
Basophils Absolute: 0 K/uL (ref 0.0–0.1)
Basophils Relative: 0 %
Eosinophils Absolute: 0.1 K/uL (ref 0.0–0.5)
Eosinophils Relative: 1 %
HCT: 43.3 % (ref 39.0–52.0)
Hemoglobin: 14 g/dL (ref 13.0–17.0)
Immature Granulocytes: 1 %
Lymphocytes Relative: 8 %
Lymphs Abs: 0.9 K/uL (ref 0.7–4.0)
MCH: 33.6 pg (ref 26.0–34.0)
MCHC: 32.3 g/dL (ref 30.0–36.0)
MCV: 103.8 fL — ABNORMAL HIGH (ref 80.0–100.0)
Monocytes Absolute: 0.7 K/uL (ref 0.1–1.0)
Monocytes Relative: 6 %
Neutro Abs: 10.1 K/uL — ABNORMAL HIGH (ref 1.7–7.7)
Neutrophils Relative %: 84 %
Platelets: 266 K/uL (ref 150–400)
RBC: 4.17 MIL/uL — ABNORMAL LOW (ref 4.22–5.81)
RDW: 13.3 % (ref 11.5–15.5)
WBC: 12 K/uL — ABNORMAL HIGH (ref 4.0–10.5)
nRBC: 0 % (ref 0.0–0.2)

## 2021-11-05 LAB — URINALYSIS, MICROSCOPIC (REFLEX): RBC / HPF: >50 RBC/hpf (ref 0–5)

## 2021-11-05 LAB — URINALYSIS, ROUTINE W REFLEX MICROSCOPIC

## 2021-11-05 LAB — RESP PANEL BY RT-PCR (FLU A&B, COVID) ARPGX2
Influenza A by PCR: NEGATIVE
Influenza B by PCR: NEGATIVE
SARS Coronavirus 2 by RT PCR: NEGATIVE

## 2021-11-05 MED ORDER — ATORVASTATIN CALCIUM 40 MG PO TABS
40.0000 mg | ORAL_TABLET | Freq: Every evening | ORAL | Status: DC
  Administered 2021-11-05 – 2021-11-09 (×5): 40 mg via ORAL
  Filled 2021-11-05 (×5): qty 1

## 2021-11-05 MED ORDER — POLYETHYLENE GLYCOL 3350 17 G PO PACK: 17.0000 g | PACK | Freq: Every day | ORAL | Status: DC | PRN

## 2021-11-05 MED ORDER — BETHANECHOL CHLORIDE 10 MG PO TABS
50.0000 mg | ORAL_TABLET | Freq: Two times a day (BID) | ORAL | Status: DC
  Administered 2021-11-05 – 2021-11-10 (×10): 50 mg via ORAL
  Filled 2021-11-05 (×2): qty 5
  Filled 2021-11-05: qty 2
  Filled 2021-11-05 (×8): qty 5

## 2021-11-05 MED ORDER — BUPROPION HCL ER (XL) 150 MG PO TB24
150.0000 mg | ORAL_TABLET | Freq: Two times a day (BID) | ORAL | Status: DC
  Administered 2021-11-05 – 2021-11-09 (×9): 150 mg via ORAL
  Filled 2021-11-05 (×13): qty 1

## 2021-11-05 MED ORDER — MEMANTINE HCL 10 MG PO TABS
10.0000 mg | ORAL_TABLET | Freq: Two times a day (BID) | ORAL | Status: DC
  Administered 2021-11-05 – 2021-11-10 (×10): 10 mg via ORAL
  Filled 2021-11-05 (×11): qty 1

## 2021-11-05 MED ORDER — TETRAHYDROZOLINE HCL 0.05 % OP SOLN
1.0000 [drp] | Freq: Three times a day (TID) | OPHTHALMIC | Status: DC
  Administered 2021-11-05 – 2021-11-10 (×14): 1 [drp] via OPHTHALMIC
  Filled 2021-11-05: qty 15

## 2021-11-05 MED ORDER — INSULIN ASPART 100 UNIT/ML IJ SOLN
0.0000 [IU] | Freq: Three times a day (TID) | INTRAMUSCULAR | Status: DC
  Administered 2021-11-06: 15:00:00 2 [IU] via SUBCUTANEOUS
  Administered 2021-11-06 – 2021-11-07 (×2): 1 [IU] via SUBCUTANEOUS
  Administered 2021-11-07: 2 [IU] via SUBCUTANEOUS
  Administered 2021-11-07: 1 [IU] via SUBCUTANEOUS
  Administered 2021-11-08: 2 [IU] via SUBCUTANEOUS
  Administered 2021-11-08: 1 [IU] via SUBCUTANEOUS
  Administered 2021-11-08: 2 [IU] via SUBCUTANEOUS
  Administered 2021-11-09: 1 [IU] via SUBCUTANEOUS
  Administered 2021-11-09: 2 [IU] via SUBCUTANEOUS
  Administered 2021-11-09: 1 [IU] via SUBCUTANEOUS
  Administered 2021-11-10: 2 [IU] via SUBCUTANEOUS
  Administered 2021-11-10: 1 [IU] via SUBCUTANEOUS

## 2021-11-05 MED ORDER — HYDROCODONE-ACETAMINOPHEN 5-325 MG PO TABS
1.0000 | ORAL_TABLET | ORAL | Status: DC | PRN
Start: 2021-11-05 — End: 2021-11-10
  Administered 2021-11-06 – 2021-11-09 (×6): 2 via ORAL
  Filled 2021-11-05 (×6): qty 2

## 2021-11-05 MED ORDER — BUPROPION HCL ER (XL) 150 MG PO TB24
150.0000 mg | ORAL_TABLET | Freq: Two times a day (BID) | ORAL | Status: DC
  Filled 2021-11-05: qty 1

## 2021-11-05 MED ORDER — BUPROPION HCL ER (XL) 150 MG PO TB24
150.0000 mg | ORAL_TABLET | Freq: Every morning | ORAL | Status: DC
  Administered 2021-11-06 – 2021-11-10 (×5): 150 mg via ORAL
  Filled 2021-11-05 (×4): qty 1

## 2021-11-05 MED ORDER — ACETAMINOPHEN 650 MG RE SUPP: 650.0000 mg | Freq: Four times a day (QID) | RECTAL | Status: DC | PRN

## 2021-11-05 MED ORDER — SODIUM CHLORIDE 0.9% FLUSH
3.0000 mL | Freq: Two times a day (BID) | INTRAVENOUS | Status: DC
  Administered 2021-11-05 – 2021-11-10 (×10): 3 mL via INTRAVENOUS

## 2021-11-05 MED ORDER — DONEPEZIL HCL 10 MG PO TABS
5.0000 mg | ORAL_TABLET | Freq: Every day | ORAL | Status: DC
  Administered 2021-11-06 – 2021-11-10 (×5): 5 mg via ORAL
  Filled 2021-11-05 (×5): qty 1

## 2021-11-05 MED ORDER — ACETAMINOPHEN 325 MG PO TABS
650.0000 mg | ORAL_TABLET | Freq: Four times a day (QID) | ORAL | Status: DC | PRN
  Administered 2021-11-06 – 2021-11-07 (×2): 650 mg via ORAL
  Filled 2021-11-05 (×2): qty 2

## 2021-11-05 NOTE — ED Provider Notes (Signed)
[facility] EMERGENCY DEPARTMENT Provider Note   CSN: [id number] Arrival date & time: 11/05/21  1444     History Chief Complaint  Patient presents with   Lytle Michaels    [place] is a 75 y.o. male.  Patient presents to the emergency department for evaluation after a fall.  Patient already has difficulty with ambulation, walks with a walker at home after a recent hospitalization for pelvic fracture from a fall.  Wife reports that he was trying to back out of the bathroom and lost his balance, fell straight backwards.  Patient did hit his head on the ground but no loss of consciousness.  He takes Plavix.  Patient did complain of lower back pain at the scene, according to his wife.  He reports that he is not having any pain currently.  Patient has a Foley catheter in place since he left the hospital.  He has been experiencing gross hematuria and now blood is seeping around the catheter.      Past Medical History:  Diagnosis Date   Agent orange exposure    in [place]   Anemia    Heart block AV second degree    a. s/p PPM implant with subsequent CRTD upgrade   High cholesterol    History of colon polyps    LBBB (left bundle branch block)    Nonischemic cardiomyopathy ([place])    a. MDT CRTD upgrade 2016   Pacemaker 08/27/2013   Dual-chamber Medtronic Adapta implanted January 2013 for bradycardia with alternating bundle branch block and 2:1 atrioventricular block    Prostate cancer ([place])    "not been treated yet; on a wait and see" (01/22/2015)   Type II diabetes mellitus [facility])     Patient Active Problem List   Diagnosis Date Noted   Malnutrition of moderate degree 10/28/2021   Fracture of multiple pubic rami with routine healing, right 10/24/2021   Fall 10/21/2021   Pelvic hematoma in male 10/21/2021   Pubic ramus fracture ([place]) 10/21/2021   Acute blood loss anemia 10/21/2021   Acute COVID-19 06/16/2021   Chronic combined systolic and diastolic heart  failure ([place]) 09/28/2017   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 in situ 09/28/2017   Hx of adenomatous colonic polyps 10/20/2016   Malignant neoplasm of prostate ([place]) 99/37/1696   Chronic systolic dysfunction of left ventricle 01/22/2015   Left bundle branch block 11/24/2014   Sick sinus syndrome ([place]) 11/24/2014   Nonischemic cardiomyopathy ([place]) 11/23/2013   Ventricular tachycardia 11/23/2013   DM2 (diabetes mellitus, type 2) ([place]) 08/27/2013   Hyperlipidemia 08/27/2013   Condyloma 05/14/2013   Penile abnormality 05/05/2013   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/01/2013   Penile mass 02/19/2013   Hypertension 10/16/2012   Rectal discharge 07/14/2012   Jaw pain 07/14/2012   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   Insomnia 04/16/2011   Anxiety 04/16/2011    Past Surgical History:  Procedure Laterality Date   BASAL CELL CARCINOMA EXCISION     "back, face, neck"   BI-VENTRICULAR IMPLANTABLE CARDIOVERTER DEFIBRILLATOR UPGRADE N/A 01/22/2015   MDT CRTD upgrade by Dr Rayann Heman   BIV ICD GENERATOR CHANGEOUT N/A 08/28/2020   Procedure: BIV ICD GENERATOR CHANGEOUT;  Surgeon: [name], MD;  Location: [place] [facility];  Service: Cardiovascular;  Laterality: N/A;   CARDIAC CATHETERIZATION  01/27/2012   normal coronaries, dilated LV c/w nonischemic cardiomyopathy   EXAMINATION UNDER ANESTHESIA  04/06/2012   Procedure: EXAM UNDER ANESTHESIA;  Surgeon: Marcello Moores A. Cornett, MD;  Location:  [place] OR;  Service: General;  Laterality: N/A;   INCISION AND DRAINAGE PERIRECTAL ABSCESS  ~ 2010; 2015   INGUINAL HERNIA REPAIR Left 1980   IR ANGIOGRAM PELVIS SELECTIVE OR SUPRASELECTIVE  10/20/2021   IR ANGIOGRAM VISCERAL SELECTIVE  10/20/2021   IR EMBO ART  VEN HEMORR LYMPH EXTRAV  INC GUIDE ROADMAPPING  10/20/2021   IR US GUIDE VASC ACCESS LEFT  10/20/2021   LEFT HEART CATHETERIZATION WITH CORONARY ANGIOGRAM N/A 01/27/2012   Procedure: LEFT HEART CATHETERIZATION WITH CORONARY ANGIOGRAM;  Surgeon:  Troy Sine, MD;  Location: [facility] CATH LAB;  Service: Cardiovascular;  Laterality: N/A;   NM MYOCAR PERF WALL MOTION  01/21/2012   mod-severe perfusion defect due to infarct/scar w/mild perinfarct ischemia in the apical,basal inferoseptal,basal inferior,mid inferoseptal,midinferior and apical inferior regions   pacemaker battery     PERMANENT PACEMAKER INSERTION N/A 12/01/2011   Medtronic implanted by Dr Sallyanne Kuster   PROSTATE BIOPSY     PROSTATE BIOPSY     shrapnel surgery     "in [place]"   TONSILLECTOMY         Family History  Problem Relation Age of Onset   Ovarian cancer Mother    Cancer Mother        ?uterine   Leukemia Father    Heart disease Father    Diabetes Neg Hx     Social History   Tobacco Use   Smoking status: Former    Packs/day: 1.00    Years: 20.00    Pack years: 20.00    Types: Cigarettes    Quit date: 04/13/1984    Years since quitting: 37.5   Smokeless tobacco: Never  Vaping Use   Vaping Use: Never used  Substance Use Topics   Alcohol use: Yes    Comment: 01/22/2015 "maybe 3 beers or glasses of wine /month"   Drug use: No    Home Medications Prior to Admission medications   Medication Sig Start Date End Date Taking? Authorizing Provider  atorvastatin (LIPITOR) 80 MG tablet Take 0.5 tablets (40 mg total) by mouth every evening. 11/02/21  Yes Angiulli, Lavon Paganini, PA-C  bethanechol (URECHOLINE) 50 MG tablet Take 50 mg by mouth 2 (two) times daily. 11/04/21  Yes [provider]  buPROPion (WELLBUTRIN XL) 150 MG 24 hr tablet Take 2 tablets in AM, 1 tablet in PM 09/10/21  Yes Cameron Sprang, MD  clobetasol ointment (TEMOVATE) 8.41 % Apply 1 application topically daily as needed (itching).  09/26/13  Yes [provider]  clopidogrel (PLAVIX) 75 MG tablet Take 1 tablet (75 mg total) by mouth daily. 11/02/21  Yes Angiulli, Lavon Paganini, PA-C  donepezil (ARICEPT) 5 MG tablet Take 1 tablet (5 mg total) by mouth daily. 09/10/21  Yes Cameron Sprang, MD   HYDROcodone-acetaminophen (NORCO/VICODIN) 5-325 MG tablet Take 1-2 tablets by mouth every 4 (four) hours as needed for moderate pain. 11/02/21  Yes Angiulli, Lavon Paganini, PA-C  LORazepam (ATIVAN) 0.5 MG tablet Take 1 tablet (0.5 mg total) by mouth every 8 (eight) hours as needed for anxiety. 11/02/21  Yes Angiulli, Lavon Paganini, PA-C  memantine (NAMENDA) 10 MG tablet Take 1 tablet twice a day Patient taking differently: Take 10 mg by mouth 2 (two) times daily. 09/10/21  Yes Cameron Sprang, MD  metFORMIN (GLUCOPHAGE) 500 MG tablet Take 0.5 tablets (250 mg total) by mouth 2 (two) times daily with a meal. Patient taking differently: Take 500 mg by mouth daily. 11/03/21  Yes [place], Lavon Paganini,  PA-C  sodium chloride (OCEAN) 0.65 % SOLN nasal spray Place 1 spray into both nostrils as needed for congestion. 10/24/21  Yes Hosie Poisson, MD  acetaminophen (TYLENOL) 325 MG tablet Take 2 tablets (650 mg total) by mouth every 6 (six) hours as needed for mild pain (or Fever >/= 101). Patient not taking: Reported on 11/05/2021 11/02/21   Angiulli, Lavon Paganini, PA-C  Carboxymethylcellulose Sodium (EYE DROPS OP) Place 1 drop into both eyes 3 (three) times daily. Given by [facility] doctor to help astigmtism    [provider]  cholecalciferol (VITAMIN D) 25 MCG (1000 UNIT) tablet Take 1 tablet (1,000 Units total) by mouth daily. 11/02/21   Angiulli, Lavon Paganini, PA-C  loratadine (CLARITIN) 10 MG tablet Take 1 tablet (10 mg total) by mouth daily. 10/25/21   Hosie Poisson, MD  polyethylene glycol (MIRALAX / GLYCOLAX) 17 g packet Take 17 g by mouth 2 (two) times daily. 11/02/21   Angiulli, Lavon Paganini, PA-C    Allergies    Sulfa antibiotics and 5-alpha reductase inhibitors  Review of Systems   Review of Systems  Genitourinary:  Positive for hematuria.  Musculoskeletal:  Positive for back pain.  All other systems reviewed and are negative.  Physical Exam Updated Vital Signs BP 121/69    Pulse 89    Temp 98 F (36.7 C)  (Oral)    Resp 18    Ht 6\' 4"  (1.93 m)    Wt 66.9 kg    SpO2 96%    BMI 17.95 kg/m   Physical Exam Vitals and nursing note reviewed.  Constitutional:      General: He is not in acute distress.    Appearance: Normal appearance. He is well-developed.  HENT:     Head: Normocephalic and atraumatic.     Right Ear: Hearing normal.     Left Ear: Hearing normal.     Nose: Nose normal.  Eyes:     Conjunctiva/sclera: Conjunctivae normal.     Pupils: Pupils are equal, round, and reactive to light.  Cardiovascular:     Rate and Rhythm: Regular rhythm.     Heart sounds: S1 normal and S2 normal. No murmur heard.   No friction rub. No gallop.  Pulmonary:     Effort: Pulmonary effort is normal. No respiratory distress.     Breath sounds: Normal breath sounds.  Chest:     Chest wall: No tenderness.  Abdominal:     General: Bowel sounds are normal.     Palpations: Abdomen is soft.     Tenderness: There is no abdominal tenderness. There is no guarding or rebound. Negative signs include Murphy's sign and McBurney's sign.     Hernia: No hernia is present.  Musculoskeletal:        General: Normal range of motion.     Cervical back: Normal range of motion and neck supple.  Skin:    General: Skin is warm and dry.     Findings: No rash.  Neurological:     Mental Status: He is alert and oriented to person, place, and time.     GCS: GCS eye subscore is 4. GCS verbal subscore is 5. GCS motor subscore is 6.     Cranial Nerves: No cranial nerve deficit.     Sensory: No sensory deficit.     Coordination: Coordination normal.  Psychiatric:        Speech: Speech normal.        Behavior: Behavior normal.  Thought Content: Thought content normal.    ED Results / Procedures / Treatments   Labs (all labs ordered are listed, but only abnormal results are displayed) Labs Reviewed  CBC WITH DIFFERENTIAL/PLATELET - Abnormal; Notable for the following components:      Result Value   WBC 12.0 (*)     RBC 4.17 (*)    MCV 103.8 (*)    Neutro Abs 10.1 (*)    All other components within normal limits  COMPREHENSIVE METABOLIC PANEL - Abnormal; Notable for the following components:   Glucose, Bld 110 (*)    BUN 25 (*)    Creatinine, Ser 1.26 (*)    Albumin 3.3 (*)    Alkaline Phosphatase 227 (*)    Total Bilirubin 1.4 (*)    GFR, Estimated 59 (*)    All other components within normal limits  URINALYSIS, ROUTINE W REFLEX MICROSCOPIC - Abnormal; Notable for the following components:   Color, Urine BROWN (*)    APPearance TURBID (*)    Glucose, UA   (*)    Value: TEST NOT REPORTED DUE TO COLOR INTERFERENCE OF URINE PIGMENT   Hgb urine dipstick   (*)    Value: TEST NOT REPORTED DUE TO COLOR INTERFERENCE OF URINE PIGMENT   Bilirubin Urine   (*)    Value: TEST NOT REPORTED DUE TO COLOR INTERFERENCE OF URINE PIGMENT   Ketones, ur   (*)    Value: TEST NOT REPORTED DUE TO COLOR INTERFERENCE OF URINE PIGMENT   Protein, ur   (*)    Value: TEST NOT REPORTED DUE TO COLOR INTERFERENCE OF URINE PIGMENT   Nitrite   (*)    Value: TEST NOT REPORTED DUE TO COLOR INTERFERENCE OF URINE PIGMENT   Leukocytes,Ua   (*)    Value: TEST NOT REPORTED DUE TO COLOR INTERFERENCE OF URINE PIGMENT   All other components within normal limits  URINALYSIS, MICROSCOPIC (REFLEX) - Abnormal; Notable for the following components:   Bacteria, UA RARE (*)    All other components within normal limits  URINE CULTURE  RESP PANEL BY RT-PCR (FLU A&B, COVID) [id number]    EKG None  Radiology DG Chest 1 View  Result Date: 11/05/2021 CLINICAL DATA:  Shortness of breath. Low back pain and right hip pain. EXAM: CHEST  1 VIEW COMPARISON:  10/20/2021 FINDINGS: AICD noted. The lungs appear clear. Cardiac and mediastinal contours normal. No pleural effusion identified. Mild thoracic spondylosis. IMPRESSION: 1.  No active cardiopulmonary disease is radiographically apparent. 2. Mild thoracic spondylosis. Electronically Signed   By:  Van Clines M.D.   On: 11/05/2021 16:17   DG Lumbar Spine Complete  Result Date: 11/05/2021 CLINICAL DATA:  Low back pain and right hip pain after a fall. EXAM: LUMBAR SPINE - COMPLETE 4+ VIEW COMPARISON:  CT 10/20/2021 FINDINGS: Five lumbar type vertebral bodies. Diffuse bone demineralization. Normal alignment. No vertebral compression deformities. No focal bone lesion or bone destruction. Degenerative changes throughout the lumbar spine with narrowed interspaces and endplate hypertrophic changes. Degenerative changes in the facet joints. Visualized sacrum appears intact. IMPRESSION: Degenerative changes in the lumbar spine. Normal alignment. No acute displaced fractures identified. Electronically Signed   By: Lucienne Capers M.D.   On: 11/05/2021 16:17   DG Pelvis 1-2 Views  Result Date: 11/05/2021 CLINICAL DATA:  Low back pain and right hip pain. EXAM: PELVIS - 1-2 VIEW COMPARISON:  CT 10/20/2021 FINDINGS: Comminuted fractures demonstrated in the right superior and inferior pubic rami as seen  on prior CT. Degenerative changes in the hips and lower lumbar spine. SI joints and symphysis pubis are not displaced. Visualized sacrum appears intact. IMPRESSION: Fractures of the right superior and inferior pubic rami are again demonstrated without significant change. Degenerative changes in the hips and lower lumbar spine. Electronically Signed   By: Lucienne Capers M.D.   On: 11/05/2021 16:15   CT HEAD WO CONTRAST (5MM)  Result Date: 11/05/2021 CLINICAL DATA:  Head trauma, moderate to severe, fall EXAM: CT HEAD WITHOUT CONTRAST CT CERVICAL SPINE WITHOUT CONTRAST TECHNIQUE: Multidetector CT imaging of the head and cervical spine was performed following the standard protocol without intravenous contrast. Multiplanar CT image reconstructions of the cervical spine were also generated. COMPARISON:  10/20/2021 CT head and cervical spine FINDINGS: CT HEAD FINDINGS Brain: Hyperdensity along the right  aspect of the anterior falx, measuring up to 2.8 x 0.8 x 1.2 cm (AP x TR x CC) (series 6, image 29; series 5, image 31; series 100, image 21), likely a small subdural hemorrhage. No other acute extra-axial collection. No acute infarct, intraparenchymal hemorrhage, mass, mass effect, or midline shift. Unchanged size and configuration of the ventricles. Vascular: No hyperdense vessel. Skull: No acute fracture or suspicious osseous lesion. Sinuses/Orbits: No acute finding. Status post bilateral lens replacements. Other: The mastoids are well aerated. CT CERVICAL SPINE FINDINGS Alignment: No acute listhesis. Skull base and vertebrae: No acute fracture. No primary bone lesion or focal pathologic process. Soft tissues and spinal canal: No prevertebral fluid or swelling. No visible canal hematoma. Disc levels: Multilevel degenerative changes, with fusion of C5 and C6, where there is likely mild spinal canal stenosis. Multilevel facet and uncovertebral degenerative changes, which causes severe neural foraminal narrowing on the right at C4-C5. Upper chest: Redemonstrated apical pleural-parenchymal scarring. Other: None. IMPRESSION: 1. Small subdural hemorrhage along the right aspect of the anterior falx, without significant mass effect or midline shift. 2. No other acute intracranial process. 3.  No acute fracture or traumatic listhesis in the cervical spine. These results were called by telephone at the time of interpretation on 11/05/2021 at 4:35 pm to provider [facility] , who verbally acknowledged these results. Electronically Signed   By: Merilyn Baba M.D.   On: 11/05/2021 16:36   CT CERVICAL SPINE WO CONTRAST  Result Date: 11/05/2021 CLINICAL DATA:  Head trauma, moderate to severe, fall EXAM: CT HEAD WITHOUT CONTRAST CT CERVICAL SPINE WITHOUT CONTRAST TECHNIQUE: Multidetector CT imaging of the head and cervical spine was performed following the standard protocol without intravenous contrast. Multiplanar  CT image reconstructions of the cervical spine were also generated. COMPARISON:  10/20/2021 CT head and cervical spine FINDINGS: CT HEAD FINDINGS Brain: Hyperdensity along the right aspect of the anterior falx, measuring up to 2.8 x 0.8 x 1.2 cm (AP x TR x CC) (series 6, image 29; series 5, image 31; series 100, image 21), likely a small subdural hemorrhage. No other acute extra-axial collection. No acute infarct, intraparenchymal hemorrhage, mass, mass effect, or midline shift. Unchanged size and configuration of the ventricles. Vascular: No hyperdense vessel. Skull: No acute fracture or suspicious osseous lesion. Sinuses/Orbits: No acute finding. Status post bilateral lens replacements. Other: The mastoids are well aerated. CT CERVICAL SPINE FINDINGS Alignment: No acute listhesis. Skull base and vertebrae: No acute fracture. No primary bone lesion or focal pathologic process. Soft tissues and spinal canal: No prevertebral fluid or swelling. No visible canal hematoma. Disc levels: Multilevel degenerative changes, with fusion of C5 and C6, where there  is likely mild spinal canal stenosis. Multilevel facet and uncovertebral degenerative changes, which causes severe neural foraminal narrowing on the right at C4-C5. Upper chest: Redemonstrated apical pleural-parenchymal scarring. Other: None. IMPRESSION: 1. Small subdural hemorrhage along the right aspect of the anterior falx, without significant mass effect or midline shift. 2. No other acute intracranial process. 3.  No acute fracture or traumatic listhesis in the cervical spine. These results were called by telephone at the time of interpretation on 11/05/2021 at 4:35 pm to provider [facility] , who verbally acknowledged these results. Electronically Signed   By: Merilyn Baba M.D.   On: 11/05/2021 16:36    Procedures Procedures   Medications Ordered in ED Medications - No data to display  ED Course  I have reviewed the triage vital signs and the  nursing notes.  Pertinent labs & imaging results that were available during my care of the patient were reviewed by me and considered in my medical decision making (see chart for details).    MDM Rules/Calculators/A&P                         Patient presents to the emergency department after a fall.  Patient has had generalized weakness and multiple falls recently.  He does take Plavix.  Patient fell backwards, hitting the back of his head on the ground.  No loss of consciousness.  Patient underwent CT head and cervical spine.  CT cervical spine did not show any acute pathology.  Patient does have a small subdural hematoma noted on CT scan.  His neurologic exam is at baseline.  Discussed with neurosurgery, they recommend repeat CT scan in 6 to 8 hours.  Patient also complaining of gross hematuria.  Patient has a indwelling Foley catheter.  Urine is grossly bloody with clots.  Catheter was exchanged and is draining.  Urinalysis shows red blood cells but no clear infection.  Culture pending.  Patient with slight leukocytosis, no anemia.  Platelets normal.  Chemistries show possibly slight dehydration with elevation of BUN and creatinine from baseline but no significant kidney injury.     Final Clinical Impression(s) / ED Diagnoses Final diagnoses:  SDH (subdural hematoma)    Rx / DC Orders ED Discharge Orders     None        Larrie Fraizer, Gwenyth Allegra, MD 11/05/21 [id number]

## 2021-11-05 NOTE — Telephone Encounter (Signed)
FYI I called and spoke with nurse Stanton Kidney from [facility].   She stated that she had just finished speaking with patients wife [name] and the patient never went to the ER she said they the bleeding got better.

## 2021-11-05 NOTE — ED Notes (Signed)
Pt yelling out of room due to not seeing call light within reach. Pt stated that he feels like he has to pee and is not able to pee. Pt has a catheter placed in at this time. Pt has less than 47ml of urine in cath bag. Pt was told that if he does not pee within the next hour to let staff know so we can bladder scan.

## 2021-11-05 NOTE — Telephone Encounter (Signed)
Calling to do TCM call- wife states that patient is back a [name] due to a recent fall- I will call back to check on patient

## 2021-11-05 NOTE — Telephone Encounter (Signed)
Please advise 

## 2021-11-05 NOTE — H&P (Signed)
History and Physical   Kevin [name] BMW:[id number] DOB: 25-Jan-1946 DOA: 11/05/2021  PCP: [name], MD  Patient coming from: Home  Chief Complaint: Fall  HPI: Kevin [name] is a 75 y.o. male with medical history significant of anemia, anal fissure, anxiety, CHF, sick sinus syndrome and heart block status post pacemaker and AICD, diabetes, hyperlipidemia, prostate cancer who presents after recurrent fall.  Patient recently admitted for fall and found to have pubic rami fracture and pelvic hematoma.  Underwent pelvic angiogram and coil embolization of suspected artery.  And was ultimately discharged to CIR.  Was discharged home following this and wife has concern he was not quite prepared to be at home yet.  Today patient had another fall he has had some difficulty ambulating with his walker.  He was attempting to back out of the bathroom lost his balance and fell backwards.  He did hit his head on the ground, did not lose consciousness.  Is on Plavix.  Initially had some low back pain but no pain now.  He does have a chronic Foley in place and reports gross hematuria and some blood around the catheter this was placed during previous admission due to urinary retention thought possibly to been related to his hematoma.  Reports he has follow-up with urology through the [facility] scheduled.  Patient denies fevers, chills, chest pain, shortness of breath, abdominal pain, constipation, diarrhea, nausea, vomiting.  ED Course: Vital signs in the ED are stable.  Lab work-up showed CMP with BUN 25 and creatinine stable at 1.26, glucose 110, albumin 3.3, alk phos mildly elevated at 227 and T bili 1.4.  CBC with mild leukocytosis to 12.  Respiratory panel flu and COVID pending.  Urinalysis was very turbid and unable to interpret some results however microscopic evaluation showed rare bacteria only and calcium oxalate crystals.  Urine sent for culture.  Chest x-ray showed no acute normality.  Pelvis  x-ray showed stable rami fractures.  L-spine x-ray showed degenerative disc disease but no acute abnormalities.  CT of the head showed a small subdural hematoma on the right without any significant mass-effect nor midline shift.  Otherwise no acute abnormality.  CT of the C-spine showed no acute abnormality.  Patient received Foley exchange due to the blood leaking in the ED with good drainage.  Neurosurgery was consulted and recommended repeat CT scan in 6 to 8 hours and monitoring overnight.  Review of Systems: As per HPI otherwise all other systems reviewed and are negative.  Past Medical History:  Diagnosis Date   Agent orange exposure    in [place]   Anemia    Heart block AV second degree    a. s/p PPM implant with subsequent CRTD upgrade   High cholesterol    History of colon polyps    LBBB (left bundle branch block)    Nonischemic cardiomyopathy ([place])    a. MDT CRTD upgrade 2016   Pacemaker 08/27/2013   Dual-chamber Medtronic Adapta implanted January 2013 for bradycardia with alternating bundle branch block and 2:1 atrioventricular block    Prostate cancer ([place])    "not been treated yet; on a wait and see" (01/22/2015)   Type II diabetes mellitus ([place])     Past Surgical History:  Procedure Laterality Date   BASAL CELL CARCINOMA EXCISION     "back, face, neck"   BI-VENTRICULAR IMPLANTABLE CARDIOVERTER DEFIBRILLATOR UPGRADE N/A 01/22/2015   MDT CRTD upgrade by Dr Rayann Heman   BIV ICD [facility] N/A  08/28/2020   Procedure: BIV ICD GENERATOR CHANGEOUT;  Surgeon: [name], MD;  Location: [place] [facility];  Service: Cardiovascular;  Laterality: N/A;   CARDIAC CATHETERIZATION  01/27/2012   normal coronaries, dilated LV c/w nonischemic cardiomyopathy   EXAMINATION UNDER ANESTHESIA  04/06/2012   Procedure: EXAM UNDER ANESTHESIA;  Surgeon: Marcello Moores A. Cornett, MD;  Location: [place];  Service: General;  Laterality: N/A;   INCISION AND DRAINAGE PERIRECTAL ABSCESS  ~ 2010; 2015    INGUINAL HERNIA REPAIR Left 1980   IR ANGIOGRAM PELVIS SELECTIVE OR SUPRASELECTIVE  10/20/2021   IR ANGIOGRAM VISCERAL SELECTIVE  10/20/2021   IR EMBO ART  VEN HEMORR LYMPH EXTRAV  INC GUIDE ROADMAPPING  10/20/2021   IR US GUIDE VASC ACCESS LEFT  10/20/2021   LEFT HEART CATHETERIZATION WITH CORONARY ANGIOGRAM N/A 01/27/2012   Procedure: LEFT HEART CATHETERIZATION WITH CORONARY ANGIOGRAM;  Surgeon: Troy Sine, MD;  Location: [facility] CATH LAB;  Service: Cardiovascular;  Laterality: N/A;   NM MYOCAR PERF WALL MOTION  01/21/2012   mod-severe perfusion defect due to infarct/scar w/mild perinfarct ischemia in the apical,basal inferoseptal,basal inferior,mid inferoseptal,midinferior and apical inferior regions   pacemaker battery     PERMANENT PACEMAKER INSERTION N/A 12/01/2011   Medtronic implanted by Dr Sallyanne Kuster   PROSTATE BIOPSY     PROSTATE BIOPSY     shrapnel surgery     "in [place]"   TONSILLECTOMY      Social History  reports that he quit smoking about 37 years ago. His smoking use included cigarettes. He has a 20.00 pack-year smoking history. He has never used smokeless tobacco. He reports current alcohol use. He reports that he does not use drugs.  Allergies  Allergen Reactions   Sulfa Antibiotics Swelling    Swelling of lips only.   5-Alpha Reductase Inhibitors     Family History  Problem Relation Age of Onset   Ovarian cancer Mother    Cancer Mother        ?uterine   Leukemia Father    Heart disease Father    Diabetes Neg Hx   Reviewed on admission  Prior to Admission medications   Medication Sig Start Date End Date Taking? Authorizing Provider  atorvastatin (LIPITOR) 80 MG tablet Take 0.5 tablets (40 mg total) by mouth every evening. 11/02/21  Yes Angiulli, Lavon Paganini, PA-C  bethanechol (URECHOLINE) 50 MG tablet Take 50 mg by mouth 2 (two) times daily. 11/04/21  Yes [provider]  buPROPion (WELLBUTRIN XL) 150 MG 24 hr tablet Take 2 tablets in AM, 1 tablet in PM  09/10/21  Yes Cameron Sprang, MD  clobetasol ointment (TEMOVATE) 5.69 % Apply 1 application topically daily as needed (itching).  09/26/13  Yes [provider]  clopidogrel (PLAVIX) 75 MG tablet Take 1 tablet (75 mg total) by mouth daily. 11/02/21  Yes Angiulli, Lavon Paganini, PA-C  donepezil (ARICEPT) 5 MG tablet Take 1 tablet (5 mg total) by mouth daily. 09/10/21  Yes Cameron Sprang, MD  HYDROcodone-acetaminophen (NORCO/VICODIN) 5-325 MG tablet Take 1-2 tablets by mouth every 4 (four) hours as needed for moderate pain. 11/02/21  Yes Angiulli, Lavon Paganini, PA-C  LORazepam (ATIVAN) 0.5 MG tablet Take 1 tablet (0.5 mg total) by mouth every 8 (eight) hours as needed for anxiety. 11/02/21  Yes Angiulli, Lavon Paganini, PA-C  memantine (NAMENDA) 10 MG tablet Take 1 tablet twice a day Patient taking differently: Take 10 mg by mouth 2 (two) times daily. 09/10/21  Yes Ellouise Newer  M, MD  metFORMIN (GLUCOPHAGE) 500 MG tablet Take 0.5 tablets (250 mg total) by mouth 2 (two) times daily with a meal. Patient taking differently: Take 500 mg by mouth daily. 11/03/21  Yes Angiulli, Lavon Paganini, PA-C  sodium chloride (OCEAN) 0.65 % SOLN nasal spray Place 1 spray into both nostrils as needed for congestion. 10/24/21  Yes Hosie Poisson, MD  acetaminophen (TYLENOL) 325 MG tablet Take 2 tablets (650 mg total) by mouth every 6 (six) hours as needed for mild pain (or Fever >/= 101). Patient not taking: Reported on 11/05/2021 11/02/21   Angiulli, Lavon Paganini, PA-C  Carboxymethylcellulose Sodium (EYE DROPS OP) Place 1 drop into both eyes 3 (three) times daily. Given by [facility] doctor to help astigmtism    [provider]  cholecalciferol (VITAMIN D) 25 MCG (1000 UNIT) tablet Take 1 tablet (1,000 Units total) by mouth daily. 11/02/21   Angiulli, Lavon Paganini, PA-C  loratadine (CLARITIN) 10 MG tablet Take 1 tablet (10 mg total) by mouth daily. 10/25/21   Hosie Poisson, MD  polyethylene glycol (MIRALAX / GLYCOLAX) 17 g packet Take 17 g  by mouth 2 (two) times daily. 11/02/21   Cathlyn Parsons, PA-C    Physical Exam: Vitals:   11/05/21 1458 11/05/21 1500 11/05/21 1630 11/05/21 1715  BP: 119/70 112/77 118/66 121/69  Pulse: 87 84 86 89  Resp: 17 18 17 18   Temp: 98 F (36.7 C)     TempSrc: Oral     SpO2: 97% 97% 96% 96%  Weight: 66.9 kg     Height: 6' 4"  (1.93 m)      Physical Exam Constitutional:      General: He is not in acute distress.    Appearance: Normal appearance.  HENT:     Head: Normocephalic and atraumatic.     Mouth/Throat:     Mouth: Mucous membranes are moist.     Pharynx: Oropharynx is clear.  Eyes:     Extraocular Movements: Extraocular movements intact.     Pupils: Pupils are equal, round, and reactive to light.  Cardiovascular:     Rate and Rhythm: Normal rate and regular rhythm.     Pulses: Normal pulses.     Heart sounds: Normal heart sounds.  Pulmonary:     Effort: Pulmonary effort is normal. No respiratory distress.     Breath sounds: Normal breath sounds.  Abdominal:     General: Bowel sounds are normal. There is no distension.     Palpations: Abdomen is soft.     Tenderness: There is no abdominal tenderness.  Genitourinary:    Comments: Red-Tinged urinary output in Foley bag Musculoskeletal:        General: No swelling or deformity.  Skin:    General: Skin is warm and dry.  Neurological:     General: No focal deficit present.     Mental Status: Mental status is at baseline.   Labs on Admission: I have personally reviewed following labs and imaging studies  CBC: Recent Labs  Lab 11/05/21 1620  WBC 12.0*  NEUTROABS 10.1*  HGB 14.0  HCT 43.3  MCV 103.8*  PLT 834    Basic Metabolic Panel: Recent Labs  Lab 10/31/21 0720 11/05/21 1620  NA 135 137  K 4.5 4.3  CL 99 102  CO2 27 27  GLUCOSE 172* 110*  BUN 21 25*  CREATININE 1.18 1.26*  CALCIUM 9.2 9.0    GFR: Estimated Creatinine Clearance: 47.9 mL/min (A) (by C-G formula based on  SCr of 1.26 mg/dL  (H)).  Liver Function Tests: Recent Labs  Lab 11/05/21 1620  AST 19  ALT 11  ALKPHOS 227*  BILITOT 1.4*  PROT 6.8  ALBUMIN 3.3*    Urine analysis:    Component Value Date/Time   COLORURINE BROWN (A) 11/05/2021 1736   APPEARANCEUR TURBID (A) 11/05/2021 1736   LABSPEC  11/05/2021 1736    TEST NOT REPORTED DUE TO COLOR INTERFERENCE OF URINE PIGMENT   PHURINE  11/05/2021 1736    TEST NOT REPORTED DUE TO COLOR INTERFERENCE OF URINE PIGMENT   GLUCOSEU (A) 11/05/2021 1736    TEST NOT REPORTED DUE TO COLOR INTERFERENCE OF URINE PIGMENT   GLUCOSEU NEGATIVE 02/06/2020 1609   HGBUR (A) 11/05/2021 1736    TEST NOT REPORTED DUE TO COLOR INTERFERENCE OF URINE PIGMENT   BILIRUBINUR (A) 11/05/2021 1736    TEST NOT REPORTED DUE TO COLOR INTERFERENCE OF URINE PIGMENT   KETONESUR (A) 11/05/2021 1736    TEST NOT REPORTED DUE TO COLOR INTERFERENCE OF URINE PIGMENT   PROTEINUR (A) 11/05/2021 1736    TEST NOT REPORTED DUE TO COLOR INTERFERENCE OF URINE PIGMENT   UROBILINOGEN 0.2 02/06/2020 1609   NITRITE (A) 11/05/2021 1736    TEST NOT REPORTED DUE TO COLOR INTERFERENCE OF URINE PIGMENT   LEUKOCYTESUR (A) 11/05/2021 1736    TEST NOT REPORTED DUE TO COLOR INTERFERENCE OF URINE PIGMENT    Radiological Exams on Admission: DG Chest 1 View  Result Date: 11/05/2021 CLINICAL DATA:  Shortness of breath. Low back pain and right hip pain. EXAM: CHEST  1 VIEW COMPARISON:  10/20/2021 FINDINGS: AICD noted. The lungs appear clear. Cardiac and mediastinal contours normal. No pleural effusion identified. Mild thoracic spondylosis. IMPRESSION: 1.  No active cardiopulmonary disease is radiographically apparent. 2. Mild thoracic spondylosis. Electronically Signed   By: Van Clines M.D.   On: 11/05/2021 16:17   DG Lumbar Spine Complete  Result Date: 11/05/2021 CLINICAL DATA:  Low back pain and right hip pain after a fall. EXAM: LUMBAR SPINE - COMPLETE 4+ VIEW COMPARISON:  CT 10/20/2021 FINDINGS: Five  lumbar type vertebral bodies. Diffuse bone demineralization. Normal alignment. No vertebral compression deformities. No focal bone lesion or bone destruction. Degenerative changes throughout the lumbar spine with narrowed interspaces and endplate hypertrophic changes. Degenerative changes in the facet joints. Visualized sacrum appears intact. IMPRESSION: Degenerative changes in the lumbar spine. Normal alignment. No acute displaced fractures identified. Electronically Signed   By: Lucienne Capers M.D.   On: 11/05/2021 16:17   DG Pelvis 1-2 Views  Result Date: 11/05/2021 CLINICAL DATA:  Low back pain and right hip pain. EXAM: PELVIS - 1-2 VIEW COMPARISON:  CT 10/20/2021 FINDINGS: Comminuted fractures demonstrated in the right superior and inferior pubic rami as seen on prior CT. Degenerative changes in the hips and lower lumbar spine. SI joints and symphysis pubis are not displaced. Visualized sacrum appears intact. IMPRESSION: Fractures of the right superior and inferior pubic rami are again demonstrated without significant change. Degenerative changes in the hips and lower lumbar spine. Electronically Signed   By: Lucienne Capers M.D.   On: 11/05/2021 16:15   CT HEAD WO CONTRAST (5MM)  Result Date: 11/05/2021 CLINICAL DATA:  Head trauma, moderate to severe, fall EXAM: CT HEAD WITHOUT CONTRAST CT CERVICAL SPINE WITHOUT CONTRAST TECHNIQUE: Multidetector CT imaging of the head and cervical spine was performed following the standard protocol without intravenous contrast. Multiplanar CT image reconstructions of the cervical spine were also generated. COMPARISON:  10/20/2021 CT head and cervical spine FINDINGS: CT HEAD FINDINGS Brain: Hyperdensity along the right aspect of the anterior falx, measuring up to 2.8 x 0.8 x 1.2 cm (AP x TR x CC) (series 6, image 29; series 5, image 31; series 100, image 21), likely a small subdural hemorrhage. No other acute extra-axial collection. No acute infarct,  intraparenchymal hemorrhage, mass, mass effect, or midline shift. Unchanged size and configuration of the ventricles. Vascular: No hyperdense vessel. Skull: No acute fracture or suspicious osseous lesion. Sinuses/Orbits: No acute finding. Status post bilateral lens replacements. Other: The mastoids are well aerated. CT CERVICAL SPINE FINDINGS Alignment: No acute listhesis. Skull base and vertebrae: No acute fracture. No primary bone lesion or focal pathologic process. Soft tissues and spinal canal: No prevertebral fluid or swelling. No visible canal hematoma. Disc levels: Multilevel degenerative changes, with fusion of C5 and C6, where there is likely mild spinal canal stenosis. Multilevel facet and uncovertebral degenerative changes, which causes severe neural foraminal narrowing on the right at C4-C5. Upper chest: Redemonstrated apical pleural-parenchymal scarring. Other: None. IMPRESSION: 1. Small subdural hemorrhage along the right aspect of the anterior falx, without significant mass effect or midline shift. 2. No other acute intracranial process. 3.  No acute fracture or traumatic listhesis in the cervical spine. These results were called by telephone at the time of interpretation on 11/05/2021 at 4:35 pm to provider [facility] , who verbally acknowledged these results. Electronically Signed   By: Merilyn Baba M.D.   On: 11/05/2021 16:36   CT CERVICAL SPINE WO CONTRAST  Result Date: 11/05/2021 CLINICAL DATA:  Head trauma, moderate to severe, fall EXAM: CT HEAD WITHOUT CONTRAST CT CERVICAL SPINE WITHOUT CONTRAST TECHNIQUE: Multidetector CT imaging of the head and cervical spine was performed following the standard protocol without intravenous contrast. Multiplanar CT image reconstructions of the cervical spine were also generated. COMPARISON:  10/20/2021 CT head and cervical spine FINDINGS: CT HEAD FINDINGS Brain: Hyperdensity along the right aspect of the anterior falx, measuring up to 2.8 x 0.8  x 1.2 cm (AP x TR x CC) (series 6, image 29; series 5, image 31; series 100, image 21), likely a small subdural hemorrhage. No other acute extra-axial collection. No acute infarct, intraparenchymal hemorrhage, mass, mass effect, or midline shift. Unchanged size and configuration of the ventricles. Vascular: No hyperdense vessel. Skull: No acute fracture or suspicious osseous lesion. Sinuses/Orbits: No acute finding. Status post bilateral lens replacements. Other: The mastoids are well aerated. CT CERVICAL SPINE FINDINGS Alignment: No acute listhesis. Skull base and vertebrae: No acute fracture. No primary bone lesion or focal pathologic process. Soft tissues and spinal canal: No prevertebral fluid or swelling. No visible canal hematoma. Disc levels: Multilevel degenerative changes, with fusion of C5 and C6, where there is likely mild spinal canal stenosis. Multilevel facet and uncovertebral degenerative changes, which causes severe neural foraminal narrowing on the right at C4-C5. Upper chest: Redemonstrated apical pleural-parenchymal scarring. Other: None. IMPRESSION: 1. Small subdural hemorrhage along the right aspect of the anterior falx, without significant mass effect or midline shift. 2. No other acute intracranial process. 3.  No acute fracture or traumatic listhesis in the cervical spine. These results were called by telephone at the time of interpretation on 11/05/2021 at 4:35 pm to provider [facility] , who verbally acknowledged these results. Electronically Signed   By: Merilyn Baba M.D.   On: 11/05/2021 16:36    EKG: Not performed in the ED  Assessment/Plan Principal Problem:   Subdural  hematoma Active Problems:   Anxiety   Hypertension   DM2 (diabetes mellitus, type 2) (HCC)   Hyperlipidemia   Sick sinus syndrome (HCC)   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 in situ   Fall  Falls Subdural hematoma > Patient with recurrent falls.  Had a fall earlier in  the past month and was admitted with pubic rami fractures and pelvic hematoma went to CIR after this with some improvement but family feels like he has been having trouble since he got home. > [place] again today when attempting to back out of the bathroom and lost his balance. > No new fractures on chest x-ray, pelvis x-ray, L-spine x-ray.  No acute normality on CT C-spine. > CT head did show small right-sided subdural hematoma without mass-effect nor midline shift. > Neurosurgery was consulted in the ED and recommended monitoring and repeat CT scan in 6 to 8 hours.  Does not appear neurosurgery is following at this time. > With recurrent falls patient may need evaluation for SNF stay - Monitor on telemetry - Repeat CT scan at midnight - Hold Plavix for now - Neurochecks - PT and OT eval and treat   Leukocytosis > Possibly reactive in setting of fall.  No fever.  Chest x-ray without evidence of pneumonia.  Urinalysis without clear evidence of infection however was difficult to interpret due to significant blood.  Urine culture has been sent. - Trend fever curve and white count - Follow-up urine culture  History of prostate cancer Indwelling Foley Hematuria > Had hematuria during previous admission and urinary retention and Foley was placed due to family feeling uncomfortable with in and out catheterization. > Has had significant hematuria recently.  With history of prostate cancer this may need further imaging evaluation > This is also likely contributing to falls with difficulty managing urinary catheter > Catheter exchanged in the ED with good output. - Follow-up urine culture as above - Could consider further imaging, but is seeing [place] for prostate cancer and has urology visit schedule in the next couple of weeks per his report - Hemoglobin remained stable, will trend   Diabetes -SSI  CHF > Diagnosis of this last echo in chart was October 2021 with EF 40-45% and grade 1 diastolic  dysfunction with mildly reduced RV function.  Not currently on any diuretics. - Continue to monitor fluid status and renal function  Anxiety Memory issues - Continue home bupropion, Aricept, Namenda  CKD > Creatinine stable at 1.2 - Avoid nephrotoxic agents - Trend renal function electrolytes  History of sick sinus syndrome, heart block - Is status post AICD/pacemaker  DVT prophylaxis: SCD's  Code Status:   Full Family Communication:  None on admission.  Patient states that his wife just left and that she is up-to-date with his treatment plan. Disposition Plan:   Patient is from:  Home  Anticipated DC to:  Pending clinical course  Anticipated DC date:  1 to 2 days  Anticipated DC barriers: None  Consults called:  Neurosurgery, consulted by EDP, does not appear they are following  Admission status:  Observation, telemetry   Severity of Illness: The appropriate patient status for this patient is OBSERVATION. Observation status is judged to be reasonable and necessary in order to provide the required intensity of service to ensure the patient's safety. The patient's presenting symptoms, physical exam findings, and initial radiographic and laboratory data in the context of their medical condition is felt to place them at decreased risk for further clinical deterioration.  Furthermore, it is anticipated that the patient will be medically stable for discharge from the hospital within 2 midnights of admission.    Marcelyn Bruins MD [facility]  How to contact the [facility] Attending or Consulting provider [place] or covering provider during after hours [place], for this patient?   Check the care team in [facility] and look for a) attending/consulting [facility] provider listed and b) the [facility] team listed Log into [email or web address] and use [place]'s universal password to access. If you do not have the password, please contact the hospital operator. Locate the [facility] provider you are looking for under  [facility] and page to a number that you can be directly reached. If you still have difficulty reaching the provider, please page the [facility] (Director on Call) for the Hospitalists listed on amion for assistance.  11/05/2021, 7:51 PM

## 2021-11-05 NOTE — ED Triage Notes (Signed)
Pt arrived via [facility] from home for fall. Pt had surgery 2 weeks. Pt had a fall from baseline weakness with unknown if pt hid head.  Pt is on Plavix. Pt presents with foley, which has multiple blood clots.   146/70, 106, 100%

## 2021-11-06 ENCOUNTER — Encounter (HOSPITAL_COMMUNITY): Payer: Self-pay | Admitting: Internal Medicine

## 2021-11-06 ENCOUNTER — Observation Stay (HOSPITAL_COMMUNITY): Payer: No Typology Code available for payment source

## 2021-11-06 DIAGNOSIS — S3210XA Unspecified fracture of sacrum, initial encounter for closed fracture: Secondary | ICD-10-CM | POA: Diagnosis not present

## 2021-11-06 DIAGNOSIS — R310 Gross hematuria: Secondary | ICD-10-CM | POA: Diagnosis not present

## 2021-11-06 DIAGNOSIS — I6200 Nontraumatic subdural hemorrhage, unspecified: Secondary | ICD-10-CM | POA: Diagnosis not present

## 2021-11-06 DIAGNOSIS — S301XXA Contusion of abdominal wall, initial encounter: Secondary | ICD-10-CM | POA: Diagnosis not present

## 2021-11-06 DIAGNOSIS — S32511A Fracture of superior rim of right pubis, initial encounter for closed fracture: Secondary | ICD-10-CM | POA: Diagnosis not present

## 2021-11-06 DIAGNOSIS — S065XAA Traumatic subdural hemorrhage with loss of consciousness status unknown, initial encounter: Secondary | ICD-10-CM | POA: Diagnosis not present

## 2021-11-06 LAB — COMPREHENSIVE METABOLIC PANEL
ALT: 9 U/L (ref 0–44)
AST: 20 U/L (ref 15–41)
Albumin: 3.3 g/dL — ABNORMAL LOW (ref 3.5–5.0)
Alkaline Phosphatase: 238 U/L — ABNORMAL HIGH (ref 38–126)
Anion gap: 6 (ref 5–15)
BUN: 20 mg/dL (ref 8–23)
CO2: 27 mmol/L (ref 22–32)
Calcium: 9 mg/dL (ref 8.9–10.3)
Chloride: 104 mmol/L (ref 98–111)
Creatinine, Ser: 1.18 mg/dL (ref 0.61–1.24)
GFR, Estimated: >60 mL/min (ref >60)
Glucose, Bld: 133 mg/dL — ABNORMAL HIGH (ref 70–99)
Potassium: 4.2 mmol/L (ref 3.5–5.1)
Sodium: 137 mmol/L (ref 135–145)
Total Bilirubin: 1.3 mg/dL — ABNORMAL HIGH (ref 0.3–1.2)
Total Protein: 6.7 g/dL (ref 6.5–8.1)

## 2021-11-06 LAB — CBC
HCT: 41.5 % (ref 39.0–52.0)
Hemoglobin: 13.9 g/dL (ref 13.0–17.0)
MCH: 34 pg (ref 26.0–34.0)
MCHC: 33.5 g/dL (ref 30.0–36.0)
MCV: 101.5 fL — ABNORMAL HIGH (ref 80.0–100.0)
Platelets: 265 10*3/uL (ref 150–400)
RBC: 4.09 MIL/uL — ABNORMAL LOW (ref 4.22–5.81)
RDW: 13.3 % (ref 11.5–15.5)
WBC: 10 10*3/uL (ref 4.0–10.5)
nRBC: 0 % (ref 0.0–0.2)

## 2021-11-06 LAB — GLUCOSE, CAPILLARY
Glucose-Capillary: 129 mg/dL — ABNORMAL HIGH (ref 70–99)
Glucose-Capillary: 165 mg/dL — ABNORMAL HIGH (ref 70–99)
Glucose-Capillary: 134 mg/dL — ABNORMAL HIGH (ref 70–99)

## 2021-11-06 MED ORDER — IOHEXOL 300 MG/ML  SOLN
100.0000 mL | Freq: Once | INTRAMUSCULAR | Status: AC | PRN
  Administered 2021-11-06: 16:00:00 100 mL via INTRAVENOUS

## 2021-11-06 MED ORDER — CHLORHEXIDINE GLUCONATE CLOTH 2 % EX PADS
6.0000 | MEDICATED_PAD | Freq: Every day | CUTANEOUS | Status: DC
  Administered 2021-11-06 – 2021-11-10 (×4): 6 via TOPICAL

## 2021-11-06 NOTE — Progress Notes (Signed)
°  Inpatient Rehab Admissions Coordinator :  Per therapy recommendations, patient was screened for CIR candidacy by Danne Baxter RN MSN.  At this time patient appears to be a potential candidate for CIR.  Noted discharged from CIR 2 days ago.I will place a rehab consult per protocol for full assessment. Please call me with any questions.  Danne Baxter RN MSN Admissions Coordinator [phone]

## 2021-11-06 NOTE — Progress Notes (Signed)
Inpatient Rehab Admissions Coordinator:   I spoke with pt, and wife regarding returning to CIR and they state that they are interested. Wife stressed she can provide supervision only. I will open a case with the [place] and pursue for admission once Pt. Is approved.   Clemens Catholic, [place], [place] Admissions Coordinator  [phone] ([place]) [phone] (office)

## 2021-11-06 NOTE — PMR Pre-admission (Signed)
PMR Admission Coordinator Pre-Admission Assessment  Patient: Kevin [name] is an 75 y.o., male MRN: [id number] DOB: 1946-10-26 Height: 6\' 4"  (193 cm) Weight: 66.9 kg              IInsurance Information HMO:     PPO:      PCP:      IPA:      80/20:      OTHER:  [facility]      Policy#: [id number]      Subscriber: self CM Name: Sherlynn Carbon     Phone#: [phone]     Fax#: [phone] I received a message stating that Pt. Was authorized for admission 11/10/20. Cassie requests that we call her with admission date and she will give admission # and auth dates.  Pre-Cert#:       Employer: Retired Dealer:  Phone #: [phone]     Name: Harrell Lark. Date: 04/02/2018     Deduct: $0      Out of Pocket Max: $0      Life Max: N/A CIR: 100%      SNF: 100% Outpatient: 100%     Co-Pay: none Home Health: 100%      Co-Pay: none DME: 100%     Co-Pay: none Providers: in network  SECONDARY: [place] Medicare HMO      Policy#: [id number]     Phone#: self  Financial Counselor:       Phone#:   The Data Collection Information Summary for patients in Inpatient Rehabilitation Facilities with attached Privacy Act Erda Records was provided and verbally reviewed with: Patient and Family  Emergency Contact Information Contact Information     Name Relation Home Work [place] Spouse [phone]  [phone]   Cox,Cheryl Relative   [phone]      Current Medical History  Patient Admitting Diagnosis: SDH History of Present Illness:HPI: Victoriano Campion. [place] is a 75 year old male with history of NICM, agent orange exposure, LBBB s/p PPM, T2DM, dementia?, prostate cancer, recent admission for fall complicated by pubic fracture and pelvic hematoma s/p coil embolization and urinary retention requiring foley. He was underwent CIR, was discharged to home on 11/03/21 with foley due to ongoing voiding issues as well as recommendations for CG to min  assist. He was admitted on 2/29 after a fall backwards while coming out of the bathroom and gross hematuria noted in foley bag.  He was found to have small anterior falcine SDH which was stable on follow up CT per NS recs. Plavix discontinued and no neurologic changes noted. CT was negative and foley changed out in ED. Case discussed with Dr. Gloriann Loan who recommended addition of finasteride. UA showed hematuria with stable H/H and leucocytosis has resolved.   Patient with history of dizziness, tinnitus and occasional double vision for years and now with anxiety and increased fear of falling. Therapy initiated and patient noted to have balance deficits with poor posture, cognitive deficits with delay in processing simple one step commands as well as significant cognitive deficits. CIR recommended due to functional decline.   Glasgow Coma Scale Score: 15  Past Medical History  Past Medical History:  Diagnosis Date   Agent orange exposure    in [place]   Anemia    Heart block AV second degree    a. s/p PPM implant with subsequent CRTD upgrade   High cholesterol    History of colon polyps    LBBB (left bundle branch  block)    Nonischemic cardiomyopathy ([place])    a. MDT CRTD upgrade 2016   Pacemaker 08/27/2013   Dual-chamber Medtronic Adapta implanted January 2013 for bradycardia with alternating bundle branch block and 2:1 atrioventricular block    Prostate cancer ([place])    "not been treated yet; on a wait and see" (01/22/2015)   Type II diabetes mellitus ([place])     Has the patient had major surgery during 100 days prior to admission? Yes  Family History  family history includes Cancer in his mother; Heart disease in his father; Leukemia in his father; Ovarian cancer in his mother.   Current Medications   Current Facility-Administered Medications:    acetaminophen (TYLENOL) tablet 650 mg, 650 mg, Oral, Q6H PRN, 650 mg at 11/06/21 1112 **OR** acetaminophen (TYLENOL) suppository 650 mg, 650 mg,  Rectal, Q6H PRN, Marcelyn Bruins, MD   atorvastatin (LIPITOR) tablet 40 mg, 40 mg, Oral, QPM, Marcelyn Bruins, MD, 40 mg at 11/05/21 2242   bethanechol (URECHOLINE) tablet 50 mg, 50 mg, Oral, BID, Marcelyn Bruins, MD, 50 mg at 11/06/21 1010   buPROPion (WELLBUTRIN XL) 24 hr tablet 150 mg, 150 mg, Oral, BID, 150 mg at 11/06/21 0936 **AND** buPROPion (WELLBUTRIN XL) 24 hr tablet 150 mg, 150 mg, Oral, q AM, Marcelyn Bruins, MD, 150 mg at 11/06/21 8341   Chlorhexidine Gluconate Cloth 2 % PADS 6 each, 6 each, Topical, Daily, Marcelyn Bruins, MD, 6 each at 11/06/21 0707   donepezil (ARICEPT) tablet 5 mg, 5 mg, Oral, Daily, Marcelyn Bruins, MD, 5 mg at 11/06/21 1009   HYDROcodone-acetaminophen (NORCO/VICODIN) 5-325 MG per tablet 1-2 tablet, 1-2 tablet, Oral, Q4H PRN, Marcelyn Bruins, MD, 2 tablet at 11/06/21 0242   insulin aspart (novoLOG) injection 0-9 Units, 0-9 Units, Subcutaneous, TID WC, Marcelyn Bruins, MD, 1 Units at 11/06/21 0706   memantine [facility]) tablet 10 mg, 10 mg, Oral, BID, Marcelyn Bruins, MD, 10 mg at 11/06/21 1010   polyethylene glycol (MIRALAX / GLYCOLAX) packet 17 g, 17 g, Oral, Daily PRN, Marcelyn Bruins, MD   sodium chloride flush (NS) 0.9 % injection 3 mL, 3 mL, Intravenous, Q12H, Marcelyn Bruins, MD, 3 mL at 11/06/21 1010   tetrahydrozoline 0.05 % ophthalmic solution 1 drop, 1 drop, Both Eyes, TID, Marcelyn Bruins, MD, 1 drop at 11/06/21 1011  Patients Current Diet:  Diet Order             Diet heart healthy/carb modified Room service appropriate? Yes; Fluid consistency: Thin  Diet effective now                   Precautions / Restrictions Precautions Precautions: Fall Precaution Comments: pacemaker, dizzines/tinnitis Restrictions Weight Bearing Restrictions: No RLE Weight Bearing: Weight bearing as tolerated   Has the patient had 2 or more falls or a fall with injury in the past year?Yes  Prior Activity  Level Limited Community (1-2x/wk): went out for appointments  Prior Functional Level Prior Function Prior Level of Function : Needs assist Mobility Comments: Using a RW, requiring min guard to min A, pt was in CIR from 12/17-12/27, fell in bathroom on 12/29 ADLs Comments: REquiring assist with LB ADL's  Self Care: Did the patient need help bathing, dressing, using the toilet or eating?  Needed some help  Indoor Mobility: Did the patient need assistance with walking from room to room (with or without device)? Needed some help  Stairs: Did the patient need assistance  with internal or external stairs (with or without device)? Needed some help  Functional Cognition: Did the patient need help planning regular tasks such as shopping or remembering to take medications? Needed some help  Patient Information Are you of Hispanic, Latino/a,or Spanish origin?: A. No, not of Hispanic, Latino/a, or Spanish origin What is your race?: A. White Do you need or want an interpreter to communicate with a doctor or health care staff?: 0. No  Patient's Response To:  Health Literacy and Transportation Is the patient able to respond to health literacy and transportation needs?: Yes Health Literacy - How often do you need to have someone help you when you read instructions, pamphlets, or other written material from your doctor or pharmacy?: Never In the past 12 months, has lack of transportation kept you from medical appointments or from getting medications?: No In the past 12 months, has lack of transportation kept you from meetings, work, or from getting things needed for daily living?: No  Home Assistive Devices / [place] Devices/Equipment: Environmental consultant (specify type) Home Equipment: Rolling Walker (2 wheels)  Prior Device Use: Indicate devices/aids used by the patient prior to current illness, exacerbation or injury? Walker  Current Functional Level Cognition  Overall Cognitive Status:  History of cognitive impairments - at baseline Current Attention Level: Sustained Orientation Level: Oriented X4 Following Commands: Follows one step commands with increased time, Follows multi-step commands inconsistently Safety/Judgement: Decreased awareness of safety, Decreased awareness of deficits General Comments: pt pleasant, oriented to date and aware his balance is very impaired. pt c/o of dizziness which is causing his balance impairement    Extremity Assessment (includes Sensation/Coordination)  Upper Extremity Assessment: Generalized weakness  Lower Extremity Assessment: Generalized weakness    ADLs  Overall ADL's : Needs assistance/impaired Eating/Feeding: Set up, Sitting Grooming: Set up, Sitting Upper Body Bathing: Minimal assistance, Sitting Lower Body Bathing: Maximal assistance, Sit to/from stand, Sitting/lateral leans Upper Body Dressing : Min guard, Sitting Lower Body Dressing: Moderate assistance, Sitting/lateral leans, Sit to/from stand Toilet Transfer: Moderate assistance, Ambulation Toileting- Clothing Manipulation and Hygiene: Moderate assistance, Sitting/lateral lean, Sit to/from stand Functional mobility during ADLs: Minimal assistance, Moderate assistance, Rolling walker (2 wheels) General ADL Comments: Pt requiring max cuing, poor balance, and weakness.    Mobility  Overal bed mobility: Needs Assistance Bed Mobility: Supine to Sit Supine to sit: Min guard Sit to supine: Min guard General bed mobility comments: HOB elevated, increased time, no directional verbal or tactile cues required    Transfers  Overall transfer level: Needs assistance Equipment used: Rolling walker (2 wheels) Transfers: Sit to/from Stand Sit to Stand: Min assist General transfer comment: Min A to power up from low bed surface, verbal cues for safe hand placement, modA to control descent into sitting as pt didn't reach back for chair    Ambulation / Gait / Stairs / Wheelchair  Mobility  Ambulation/Gait Ambulation/Gait assistance: Herbalist (Feet): 10 Feet Assistive device: Rolling walker (2 wheels) Gait Pattern/deviations: Step-to pattern, Decreased stride length, Shuffle, Trunk flexed General Gait Details: pt with toe in gait pattern with decreased step height and lenght, pt practiced gaze stabilization which decreased the intensity of dizziness during ambulation, pt with increased fear of falling due to dizziness and recent history Gait velocity: decreased Gait velocity interpretation: <1.8 ft/sec, indicate of risk for recurrent falls    Posture / Balance Dynamic Sitting Balance Sitting balance - Comments: pt unable to don socks due to dizziness Balance Overall balance assessment: Needs  assistance Sitting-balance support: Feet supported, No upper extremity supported Sitting balance-Leahy Scale: Fair Sitting balance - Comments: pt unable to don socks due to dizziness Standing balance support: Bilateral upper extremity supported, Reliant on assistive device for balance Standing balance-Leahy Scale: Poor Standing balance comment: Reliant on BUE to offload painful RLE in static standing and mobility, physical assist needed for stability    Special needs/care consideration Skin ecchymosis to R and medial hip and thigh and Special service needs none     Previous Home Environment (from acute therapy documentation) Living Arrangements: Spouse/significant other  Lives With: Spouse Olegario Shearer) Available Help at Discharge: Family, Available PRN/intermittently Type of Home: House Home Layout: One level Home Access: Stairs to enter Entrance Stairs-Rails: Can reach both Entrance Stairs-Number of Steps: 2 Bathroom Shower/Tub: Multimedia programmer: Handicapped height Bathroom Accessibility: Yes How Accessible: Accessible via walker Home Care Services: Yes Type of Home Care Services: Home PT, Home RN [place] (if known): advance  homecare Additional Comments: Pt is a poor historian, most info taken from chart review.  Discharge Living Setting Plans for Discharge Living Setting: Patient's home Type of Home at Discharge: House Discharge Home Layout: Two level, Bed/bath upstairs, Able to live on main level with bedroom/bathroom Alternate Level Stairs-Number of Steps: Flight Discharge Home Access: Stairs to enter Entrance Stairs-Rails: Right, Left, Can reach both Entrance Stairs-Number of Steps: 4 Discharge Bathroom Shower/Tub: Walk-in shower Discharge Bathroom Toilet: Standard Discharge Bathroom Accessibility: Yes How Accessible: Accessible via walker, Accessible via wheelchair Does the patient have any problems obtaining your medications?: No  Social/Family/Support Systems Patient Roles: Spouse Contact Information: Lorenzo Arscott Anticipated Caregiver's Contact Information: [phone] Ability/Limitations of Caregiver: Wife is a Pharmacist, hospital, Can do supervision only Caregiver Availability: Other (Comment) (Wife teaches, so Pt. will be alone during some days) Discharge Plan Discussed with Primary Caregiver: Yes Is Caregiver In Agreement with Plan?: Yes Does Caregiver/Family have Issues with Lodging/Transportation while Pt is in Rehab?: No   Goals Patient/Family Goal for Rehab: PT/OT/SLP Supervision- Mod I Expected length of stay: 7-10 days Pt/Family Agrees to Admission and willing to participate: Yes Program Orientation Provided & Reviewed with Pt/Caregiver Including Roles  & Responsibilities: Yes   Decrease burden of Care through IP rehab admission: Specialzed equipment needs, Decrease number of caregivers, Bowel and bladder program, and Patient/family education   Possible need for SNF placement upon discharge:not anticipated   Patient Condition: I have reviewed medical records from [facility], spoken with CM, and patient and spouse. I met with patient at the bedside for inpatient  rehabilitation assessment.  Patient will benefit from ongoing PT, OT, and SLP, can actively participate in 3 hours of therapy a day 5 days of the week, and can make measurable gains during the admission.  Patient will also benefit from the coordinated team approach during an Inpatient Acute Rehabilitation admission.  The patient will receive intensive therapy as well as Rehabilitation physician, nursing, social worker, and care management interventions.  Due to bladder management, safety, skin/wound care, disease management, medication administration, pain management, and patient education the patient requires 24 hour a day rehabilitation nursing.  The patient is currently min A with mobility and basic ADLs.  Discharge setting and therapy post discharge at home with home health is anticipated.  Patient has agreed to participate in the Acute Inpatient Rehabilitation Program and will admit today.  Preadmission Screen Completed By:  Genella Mech, CCC-SLP, 11/06/2021 12:59 PM ______________________________________________________________________   Discussed status with Dr. Ranell Patrick at [phone]  and received approval for admission today.  Admission Coordinator:  Genella Mech, time 1330/Date 11/10/21

## 2021-11-06 NOTE — Plan of Care (Signed)

## 2021-11-06 NOTE — Evaluation (Signed)
Physical Therapy Evaluation Patient Details Name: Kevin [name] MRN: [id number] DOB: 06-Dec-1945 Today's Date: 11/06/2021  History of Present Illness  75 y.o. M admited on 12/29 due to a fall where he hit his head, CT SDH. Recently hospitalized and in AIR for fall with pubic rami fracture and pelvis hematoma. Prior medical history significant of anemia, anal fissure, anxiety, CHF, sick sinus syndrome and heart block status post pacemaker and AICD, diabetes, hyperlipidemia, prostate cancer.   Clinical Impression  Pt admitted with above. Pt with no recollection of second fall, thinks it was the original fall from 12/13 in which he can describe in appropriate, accurate detail. Pt doesn't recall second fall at all. Pt with c/o dizziness and reports of tinnitus for the last 8-9 years and freq double vision, especially when watching tv. Suspect this is a contributory factor to his recent falls this month. Pt now very timid to ambulate due to dizziness. Attempted gaze stabilization and reports minimal relief when ambulating. Pt unable to bend over and don socks due to dizziness. Pt motivated and desires to get better. Recommend CIR upon d/c with focus on vestibular assessment/therapy in conjunction with strengthening and balance to improve safety with overall mobility for safe transfer home. Pt to benefit from ENT follow to address tinnitus. Acute PT to cont to follow.       Recommendations for follow up therapy are one component of a multi-disciplinary discharge planning process, led by the attending physician.  Recommendations may be updated based on patient status, additional functional criteria and insurance authorization.  Follow Up Recommendations Acute inpatient rehab (3hours/day)    Assistance Recommended at Discharge Frequent or constant Supervision/Assistance  Functional Status Assessment Patient has had a recent decline in their functional status and demonstrates the ability to make  significant improvements in function in a reasonable and predictable amount of time.  Equipment Recommendations  Rolling walker (2 wheels)    Recommendations for Other Services       Precautions / Restrictions Precautions Precautions: Fall Precaution Comments: pacemaker, dizzines/tinnitis Restrictions Weight Bearing Restrictions: No RLE Weight Bearing: Weight bearing as tolerated      Mobility  Bed Mobility Overal bed mobility: Needs Assistance Bed Mobility: Supine to Sit     Supine to sit: Min guard Sit to supine: Min guard   General bed mobility comments: HOB elevated, increased time, no directional verbal or tactile cues required    Transfers Overall transfer level: Needs assistance Equipment used: Rolling walker (2 wheels) Transfers: Sit to/from Stand Sit to Stand: Min assist           General transfer comment: Min A to power up from low bed surface, verbal cues for safe hand placement, modA to control descent into sitting as pt didn't reach back for chair    Ambulation/Gait Ambulation/Gait assistance: Min assist Gait Distance (Feet): 10 Feet Assistive device: Rolling walker (2 wheels) Gait Pattern/deviations: Step-to pattern;Decreased stride length;Shuffle;Trunk flexed Gait velocity: decreased Gait velocity interpretation: <1.8 ft/sec, indicate of risk for recurrent falls   General Gait Details: pt with toe in gait pattern with decreased step height and lenght, pt practiced gaze stabilization which decreased the intensity of dizziness during ambulation, pt with increased fear of falling due to dizziness and recent history  Stairs            Wheelchair Mobility    Modified Rankin (Stroke Patients Only) Modified Rankin (Stroke Patients Only) Pre-Morbid Rankin Score: Moderately severe disability Modified Rankin: Moderately severe disability  Balance Overall balance assessment: Needs assistance Sitting-balance support: Feet supported;No upper  extremity supported Sitting balance-Leahy Scale: Fair Sitting balance - Comments: pt unable to don socks due to dizziness   Standing balance support: Bilateral upper extremity supported;Reliant on assistive device for balance Standing balance-Leahy Scale: Poor Standing balance comment: Reliant on BUE to offload painful RLE in static standing and mobility, physical assist needed for stability                             Pertinent Vitals/Pain Pain Assessment: 0-10 Pain Score: 8  Pain Location: Headache Pain Descriptors / Indicators: Headache Pain Intervention(s): Monitored during session (RN notified)    Home Living Family/patient expects to be discharged to:: Private residence Living Arrangements: Spouse/significant other Available Help at Discharge: Family;Available PRN/intermittently Type of Home: House Home Access: Stairs to enter Entrance Stairs-Rails: Can reach both Entrance Stairs-Number of Steps: 2   Home Layout: One level Home Equipment: Conservation officer, nature (2 wheels) Additional Comments: Pt is a poor historian, most info taken from chart review.    Prior Function Prior Level of Function : Needs assist             Mobility Comments: Using a RW, requiring min guard to min A, pt was in CIR from 12/17-12/27, fell in bathroom on 12/29 ADLs Comments: REquiring assist with LB ADL's     Hand Dominance   Dominant Hand: Right    Extremity/Trunk Assessment   Upper Extremity Assessment Upper Extremity Assessment: Generalized weakness    Lower Extremity Assessment Lower Extremity Assessment: Generalized weakness    Cervical / Trunk Assessment Cervical / Trunk Assessment: Kyphotic  Communication   Communication: No difficulties  Cognition Arousal/Alertness: Awake/alert Behavior During Therapy: WFL for tasks assessed/performed Overall Cognitive Status: History of cognitive impairments - at baseline Area of Impairment: Memory;Following  commands;Safety/judgement;Awareness;Problem solving                 Orientation Level: Disoriented to;Time;Situation (pt doesn't recall second fall, can tell all details appropriately from fall down the stairs on 12/13 but doesn't recall recent fall yesterday, 12/29. Pt re-oriented and appears to be able to recall) Current Attention Level: Sustained Memory: Decreased short-term memory Following Commands: Follows one step commands with increased time;Follows multi-step commands inconsistently Safety/Judgement: Decreased awareness of safety;Decreased awareness of deficits Awareness: Emergent Problem Solving: Requires verbal cues General Comments: pt pleasant, oriented to date and aware his balance is very impaired. pt c/o of dizziness which is causing his balance impairement        General Comments General comments (skin integrity, edema, etc.): pt aware he had to urinate and has a catheter however pt with blood tinged urine coming out from around catheter    Exercises     Assessment/Plan    PT Assessment Patient needs continued PT services  PT Problem List Decreased strength;Decreased mobility;Decreased safety awareness;Decreased activity tolerance;Decreased balance;Pain       PT Treatment Interventions DME instruction;Therapeutic exercise;Gait training;Balance training;Stair training;Functional mobility training;Therapeutic activities;Patient/family education    PT Goals (Current goals can be found in the Care Plan section)  Acute Rehab PT Goals Patient Stated Goal: to get better PT Goal Formulation: With patient Time For Goal Achievement: 11/20/21 Potential to Achieve Goals: Good    Frequency Min 5X/week   Barriers to discharge        Co-evaluation               AM-PAC PT "6 Clicks"  Mobility  Outcome Measure Help needed turning from your back to your side while in a flat bed without using bedrails?: A Lot Help needed moving from lying on your back to  sitting on the side of a flat bed without using bedrails?: A Lot Help needed moving to and from a bed to a chair (including a wheelchair)?: A Lot Help needed standing up from a chair using your arms (e.g., wheelchair or bedside chair)?: A Lot Help needed to walk in hospital room?: Total Help needed climbing 3-5 steps with a railing? : Total 6 Click Score: 10    End of Session Equipment Utilized During Treatment: Gait belt Activity Tolerance: Patient tolerated treatment well Patient left: with call bell/phone within reach;in chair;with chair alarm set Nurse Communication: Mobility status PT Visit Diagnosis: Unsteadiness on feet (R26.81);Other abnormalities of gait and mobility (R26.89);History of falling (Z91.81);Muscle weakness (generalized) (M62.81)    Time: 1029-1100 PT Time Calculation (min) (ACUTE ONLY): 31 min   Charges:   PT Evaluation $PT Eval Moderate Complexity: 1 Mod PT Treatments $Gait Training: 8-22 mins        Kittie Plater, PT, DPT Acute Rehabilitation Services Pager #: [phone] Office #: [phone]   Berline Lopes 11/06/2021, 11:33 AM

## 2021-11-06 NOTE — Evaluation (Signed)
Occupational Therapy Evaluation Patient Details Name: Kevin [name] MRN: [id number] DOB: 11-Nov-1945 Today's Date: 11/06/2021   History of Present Illness 75 y.o. M admited on 12/29 due to a fall where he hit his head, CT shows a hematoma. Recently hospitalized and in [facility] for fall with pubic rami fracture and pelvis hematoma. Prior medical history significant of anemia, anal fissure, anxiety, CHF, sick sinus syndrome and heart block status post pacemaker and AICD, diabetes, hyperlipidemia, prostate cancer.   Clinical Impression   Pt admitted for concerns listed above. PTA pt reported that he was recently discharged from [facility], where he needed min A for all ADL's and mobility. At this time, pt presents with poor balance, weakness, cognitive deficits, and decreased activity tolerance. Pt requiring min-max A for all ADL's and functional mobility using a RW. Recommending SNF level therapies to maximize his return to independence and reduce caregiver burden. OT will follow acutely  to address concerns listed below.      Recommendations for follow up therapy are one component of a multi-disciplinary discharge planning process, led by the attending physician.  Recommendations may be updated based on patient status, additional functional criteria and insurance authorization.   Follow Up Recommendations  Skilled nursing-short term rehab (<3 hours/day)    Assistance Recommended at Discharge Frequent or constant Supervision/Assistance  Functional Status Assessment  Patient has had a recent decline in their functional status and demonstrates the ability to make significant improvements in function in a reasonable and predictable amount of time.  Equipment Recommendations  Other (comment) (TBD)    Recommendations for Other Services       Precautions / Restrictions Precautions Precautions: Fall Precaution Comments: pacemaker Restrictions Weight Bearing Restrictions: No RLE Weight Bearing: Weight  bearing as tolerated      Mobility Bed Mobility Overal bed mobility: Needs Assistance Bed Mobility: Supine to Sit;Sit to Supine     Supine to sit: Min guard Sit to supine: Min guard   General bed mobility comments: No physical assist needed    Transfers Overall transfer level: Needs assistance Equipment used: Rolling walker (2 wheels) Transfers: Sit to/from Stand Sit to Stand: Min assist           General transfer comment: Min A to power up from low bed surface, min guard to power up from [facility] surface over touilet      Balance Overall balance assessment: Needs assistance Sitting-balance support: Feet supported;No upper extremity supported Sitting balance-Leahy Scale: Fair     Standing balance support: Bilateral upper extremity supported;Reliant on assistive device for balance Standing balance-Leahy Scale: Poor Standing balance comment: Reliant on BUE to offload painful RLE in static standing and mobility, physical assist needed for stability                           ADL either performed or assessed with clinical judgement   ADL Overall ADL's : Needs assistance/impaired Eating/Feeding: Set up;Sitting   Grooming: Set up;Sitting   Upper Body Bathing: Minimal assistance;Sitting   Lower Body Bathing: Maximal assistance;Sit to/from stand;Sitting/lateral leans   Upper Body Dressing : Min guard;Sitting   Lower Body Dressing: Moderate assistance;Sitting/lateral leans;Sit to/from stand   Toilet Transfer: Moderate assistance;Ambulation   Toileting- Clothing Manipulation and Hygiene: Moderate assistance;Sitting/lateral lean;Sit to/from stand       Functional mobility during ADLs: Minimal assistance;Moderate assistance;Rolling walker (2 wheels) General ADL Comments: Pt requiring max cuing, poor balance, and weakness.     Vision Baseline Vision/History:  1 Wears glasses Ability to See in Adequate Light: 1 Impaired Patient Visual Report: Blurring of  vision Vision Assessment?: No apparent visual deficits Additional Comments: Pt reporting some dizziness upon sitting     Perception     Praxis      Pertinent Vitals/Pain Pain Assessment: 0-10 Pain Score: 7  Pain Location: Headache Pain Descriptors / Indicators: Aching;Discomfort;Grimacing Pain Intervention(s): Monitored during session;Repositioned     Hand Dominance Right   Extremity/Trunk Assessment Upper Extremity Assessment Upper Extremity Assessment: Generalized weakness   Lower Extremity Assessment Lower Extremity Assessment: Defer to PT evaluation   Cervical / Trunk Assessment Cervical / Trunk Assessment: Kyphotic   Communication Communication Communication: No difficulties   Cognition Arousal/Alertness: Awake/alert Behavior During Therapy: WFL for tasks assessed/performed Overall Cognitive Status: History of cognitive impairments - at baseline Area of Impairment: Memory;Following commands;Safety/judgement;Awareness;Problem solving                     Memory: Decreased short-term memory Following Commands: Follows one step commands with increased time;Follows multi-step commands inconsistently Safety/Judgement: Decreased awareness of safety;Decreased awareness of deficits Awareness: Emergent Problem Solving: Slow processing;Difficulty sequencing;Requires verbal cues;Decreased initiation General Comments: Pt requiring multimodal cues throughout, does not recall this fall, however able to report initial fall 2 weeks ago with all details     General Comments  Pt complained of dizziness upon sitting, blood and urine leaking around foley - RN aware    Exercises     Shoulder Instructions      Home Living Family/patient expects to be discharged to:: Private residence Living Arrangements: Spouse/significant other Available Help at Discharge: Family;Available PRN/intermittently Type of Home: House Home Access: Stairs to enter [employer] of  Steps: 2 Entrance Stairs-Rails: Can reach both Home Layout: One level     Bathroom Shower/Tub: Occupational psychologist: Handicapped height Bathroom Accessibility: Yes How Accessible: Accessible via walker Home Equipment: Quail (2 wheels)   Additional Comments: Pt is a poor historian, most info taken from chart review.      Prior Functioning/Environment Prior Level of Function : Needs assist             Mobility Comments: Using a RW, requiring min guard to min A ADLs Comments: REquiring assist with LB ADL's        OT Problem List: Decreased strength;Decreased activity tolerance;Impaired balance (sitting and/or standing);Decreased cognition;Decreased knowledge of precautions;Decreased knowledge of use of DME or AE;Decreased safety awareness;Pain      OT Treatment/Interventions: Self-care/ADL training;Therapeutic exercise;Energy conservation;DME and/or AE instruction;Therapeutic activities;Patient/family education;Balance training    OT Goals(Current goals can be found in the care plan section) Acute Rehab OT Goals Patient Stated Goal: To go home OT Goal Formulation: With patient Time For Goal Achievement: 11/20/21 Potential to Achieve Goals: Good ADL Goals Pt Will Perform Grooming: with supervision;standing Pt Will Perform Lower Body Bathing: with min guard assist;sit to/from stand;sitting/lateral leans Pt Will Perform Lower Body Dressing: with min guard assist;sitting/lateral leans;sit to/from stand Pt Will Transfer to Toilet: with min guard assist;ambulating Pt Will Perform Toileting - Clothing Manipulation and hygiene: with min guard assist;sitting/lateral leans;sit to/from stand Additional ADL Goal #1: Pt will verbalize 3 fall prevention techniques that he can use at home.  OT Frequency: Min 2X/week   Barriers to D/C:            Co-evaluation              AM-PAC OT "6 Clicks" Daily Activity  Outcome Measure Help from another person  eating meals?: A Little Help from another person taking care of personal grooming?: A Little Help from another person toileting, which includes using toliet, bedpan, or urinal?: A Lot Help from another person bathing (including washing, rinsing, drying)?: A Lot Help from another person to put on and taking off regular upper body clothing?: A Little Help from another person to put on and taking off regular lower body clothing?: A Lot 6 Click Score: 15   End of Session Equipment Utilized During Treatment: Gait belt;Rolling walker (2 wheels) Nurse Communication: Mobility status;Other (comment) (Foley leaking blood and urine)  Activity Tolerance: Patient tolerated treatment well Patient left: in bed;with call bell/phone within reach;with bed alarm set  OT Visit Diagnosis: Unsteadiness on feet (R26.81);Other abnormalities of gait and mobility (R26.89);Muscle weakness (generalized) (M62.81);Other symptoms and signs involving cognitive function;Pain                Time: 3833-3832 OT Time Calculation (min): 27 min Charges:  OT General Charges $OT Visit: 1 Visit OT Evaluation $OT Eval Moderate Complexity: 1 Mod OT Treatments $Self Care/Home Management : 8-22 mins  Iyonna Rish H., OTR/L Acute Rehabilitation  Jannell Franta Elane Lasonya Hubner 11/06/2021, 9:48 AM

## 2021-11-06 NOTE — Telephone Encounter (Signed)
Noted.  Looks like he got re-admitted with subdural hematoma following another fall.

## 2021-11-06 NOTE — Progress Notes (Signed)
PROGRESS NOTE  Kevin [name]  DOB: [birth year]  PCP: [name], MD ATF:[id number]  DOA: 11/05/2021  LOS: 0 days  Hospital Day: 2  Chief Complaint  Patient presents with   Fall    Brief narrative: Kevin [name] is a 75 y.o. male with PMH significant for DM2, HLD, nonischemic cardiomyopathy, second-degree heart block s/p PPM, prostate cancer, chronic anemia, anxiety. Patient was brought to the ED from home for recurrent fall. Patient was recently hospitalized 12/13-12/17 for mechanical fall leading to pelvic hematoma for which he underwent coil embolization of pelvic arteries.  He also had hematuria and urinary retention thought to be related to pelvic hematoma and had a Foley catheter inserted.  Per discharge summary, hematuria resolved and patient passed a voiding trial prior to discharge to [facility] on 12/17.  He stayed at [facility] for 10 days.   As previously planned, while in CIR, his Plavix was resumed on 12/23.  Per family, on the day of discharge from [place], he had urinary retention again, Foley catheter was inserted and he was discharged home with that.  At home, he started having hematuria again.  On 12/29, patient had another fall at home.  He was attempting to back out of the bathroom lost his balance and fell backwards.  He did hit his head on the ground, did not lose consciousness.   In the ED, vital signs stable CT of the head showed a small subdural hematoma on the right without any significant mass-effect nor midline shift.   CT of the C-spine showed no acute abnormality.   Chest x-ray showed no acute normality.   Pelvis x-ray showed stable rami fractures.   L-spine x-ray showed degenerative disc disease but no acute abnormalities.  Labs with BUN/creatinine 25/1.26 Urinalysis was very turbid because of large amount of blood.  It also showed rare bacteria and calcium oxalate crystals.    In the ED he was noted to have leaking of blood from the meatus and hence  underwent Foley catheter exchange with good drainage. Because of the presence of subdural hematoma, neurosurgery was consulted.  They recommended to observe overnight and repeat CT scan in 6 to 8 hours.  Subjective: Patient was seen and examined this morning.  Pleasant elderly Caucasian male.  Lying on bed.  Not in distress except for mild lower abdominal discomfort.  Oriented x3 but with some difficulty.  Slow to respond.  No family at bedside.   Urine bag with dark blood-tinged urine. Chart reviewed.  Assessment/Plan: Subdural hematoma -Secondary to a fall  -CT head with small right-sided subdural hematoma without mass-effect or midline shift. -As recommended, repeat CT scan of head was obtained in 6 to 8 hours.  It showed stable hematoma without any mass-effect or new areas of hemorrhage or new infarct. -Plavix remains on hold. -Mental status remains intact.  Hematuria History of prostate cancer -Patient has a history of prostate cancer and follows up with urology at [facility].   -Prior to last hospitalization, patient did not have hematuria or Foley catheter in place.  He had hematuria and urine retention in last hospitalization which recurred again in CIR and was discharged with a Foley catheter in place.  -He has an appointment an outpatient at [facility] for voiding trial.  As previously planned, Plavix was resumed on 12/23. -Presented again with hematuria this admission.  Catheter exchanged in the ED with good urine output. -However this morning, patient continues to have hematuria.  Bladder scan showed 150 mL  of urine.  Patient feels a little discomfort -Plavix is on hold. -Obtain a CT abdomen pelvis with contrast.  Recent pelvic hematoma -recently hospitalized 12/13-12/17 for mechanical fall leading to pelvic hematoma for which he underwent coil embolization of pelvic arteries.   Type 2 diabetes mellitus -A1c 6.7 on 10/22/2021 -Home meds include metformin 5 mg daily -Currently on hold.   Continue sliding scale insulin with Accu-Cheks Recent Labs  Lab 11/02/21 1637 11/02/21 2220 11/03/21 0518 11/03/21 1149 11/06/21 0625  GLUCAP 122* 126* 122* 113* 129*   Chronic stable systolic CHF -Echo from 97/7414 with EF 40-45% and grade 1 diastolic dysfunction with mildly reduced RV function.  Not currently on any diuretics. -Continue to monitor fluid status and renal function  Second-degree AV block -s/p PPM  CKD 3a -Stable creatinine Recent Labs    08/10/21 1613 10/20/21 0357 10/20/21 1938 10/21/21 0156 10/22/21 0359 10/24/21 0053 10/26/21 0601 10/31/21 0720 11/05/21 1620 11/06/21 0622  BUN 10 15 14 14 12 13 20 21  25* 20  CREATININE 1.14 1.32* 1.28* 1.25* 1.39* 1.13 1.09 1.18 1.26* 1.18   Anxiety Mild dementia - Continue home bupropion, Aricept, Namenda  Mobility: Needs repeat PT eval Living condition: Was discharged to home from CIR 2 days prior to presentation Goals of care:   Code Status: Full Code  Nutritional status: Body mass index is 17.95 kg/m.      Diet:  Diet Order             Diet heart healthy/carb modified Room service appropriate? Yes; Fluid consistency: Thin  Diet effective now                  DVT prophylaxis:  SCDs Start: 11/05/21 1854   Antimicrobials: None Fluid: None Consultants: Neurosurgery Family Communication: Discussed with patient's wife this morning.  Status is: Observation  Continue in-hospital care because: Needs further work-up for subdural hematoma, hematuria, may need placement Level of care: Telemetry Medical   Dispo: The patient is from: Home              Anticipated d/c is to: May need placement              Patient currently is not medically stable to d/c.   Difficult to place patient No     Infusions:    Scheduled Meds:  atorvastatin  40 mg Oral QPM   bethanechol  50 mg Oral BID   buPROPion  150 mg Oral BID   And   buPROPion  150 mg Oral q AM   Chlorhexidine Gluconate Cloth  6 each  Topical Daily   donepezil  5 mg Oral Daily   insulin aspart  0-9 Units Subcutaneous TID WC   memantine  10 mg Oral BID   sodium chloride flush  3 mL Intravenous Q12H   tetrahydrozoline  1 drop Both Eyes TID    PRN meds: acetaminophen **OR** acetaminophen, HYDROcodone-acetaminophen, polyethylene glycol   Antimicrobials: Anti-infectives (From admission, onward)    None       Objective: Vitals:   11/06/21 0358 11/06/21 0802  BP: 111/68 115/71  Pulse: 88 100  Resp: 17 18  Temp: 97.7 F (36.5 C) 97.7 F (36.5 C)  SpO2: 96% 97%   No intake or output data in the 24 hours ending 11/06/21 1025 Filed Weights   11/05/21 1458  Weight: 66.9 kg   Weight change:  Body mass index is 17.95 kg/m.   Physical Exam: General exam: Pleasant, elderly Caucasian male.  Mild physical distress because of lower abdominal discomfort Skin: No rashes, lesions or ulcers. HEENT: Atraumatic, normocephalic, no obvious bleeding Lungs: Clear to auscultation bilaterally CVS: Regular rate and rhythm, no murmur GI/Abd soft, mild lower abdominal discomfort on palpation, nondistended, bowel sound present CNS: Alert, awake, oriented x3 with some difficulty.  Slow to respond Psychiatry: Sad affect Extremities: No pedal edema, no calf tenderness  Data Review: I have personally reviewed the laboratory data and studies available.  F/u labs ordered Unresulted Labs (From admission, onward)     Start     Ordered   11/07/21 0500  CBC with Differential/Platelet  Daily,   R      11/06/21 1010   11/07/21 1552  Basic metabolic panel  Daily,   R      11/06/21 1010   11/05/21 1524  Urine Culture  ONCE - STAT,   STAT       Question:  Indication  Answer:  Acute gross hematuria   11/05/21 1524            Signed, Terrilee Croak, MD [facility] 11/06/2021

## 2021-11-06 NOTE — Progress Notes (Incomplete)
Pt still c/o he is unable to pee.  Bladder scanned 144 mls.  Urine in the urine bag is dark tea color with seepage of urine around the foley insertion site.  Will continue to monitor closely.

## 2021-11-06 NOTE — Progress Notes (Signed)
°  Transition of Care [facility]) Screening Note   Patient Details  Name: VENIAMIN KINCAID Date of Birth: 02/14/1946   Transition of Care [facility]) CM/SW Contact:    Geralynn Ochs, LCSW Phone Number: 11/06/2021, 1:06 PM    Transition of Care Department [facility]) has reviewed patient, pending insurance for CIR at this time. We will continue to monitor patient advancement through interdisciplinary progression rounds. If new patient transition needs arise, please place a TOC consult.

## 2021-11-07 DIAGNOSIS — S065XAA Traumatic subdural hemorrhage with loss of consciousness status unknown, initial encounter: Secondary | ICD-10-CM | POA: Diagnosis not present

## 2021-11-07 LAB — CBC WITH DIFFERENTIAL/PLATELET
Abs Immature Granulocytes: 0.05 10*3/uL (ref 0.00–0.07)
Basophils Absolute: 0 10*3/uL (ref 0.0–0.1)
Basophils Relative: 0 %
Eosinophils Absolute: 0.1 10*3/uL (ref 0.0–0.5)
Eosinophils Relative: 1 %
HCT: 40.2 % (ref 39.0–52.0)
Hemoglobin: 13.4 g/dL (ref 13.0–17.0)
Immature Granulocytes: 1 %
Lymphocytes Relative: 12 %
Lymphs Abs: 1.2 10*3/uL (ref 0.7–4.0)
MCH: 34 pg (ref 26.0–34.0)
MCHC: 33.3 g/dL (ref 30.0–36.0)
MCV: 102 fL — ABNORMAL HIGH (ref 80.0–100.0)
Monocytes Absolute: 0.7 10*3/uL (ref 0.1–1.0)
Monocytes Relative: 7 %
Neutro Abs: 8 10*3/uL — ABNORMAL HIGH (ref 1.7–7.7)
Neutrophils Relative %: 79 %
Platelets: 246 10*3/uL (ref 150–400)
RBC: 3.94 MIL/uL — ABNORMAL LOW (ref 4.22–5.81)
RDW: 13.3 % (ref 11.5–15.5)
WBC: 10.1 10*3/uL (ref 4.0–10.5)
nRBC: 0 % (ref 0.0–0.2)

## 2021-11-07 LAB — BASIC METABOLIC PANEL
Anion gap: 8 (ref 5–15)
BUN: 16 mg/dL (ref 8–23)
CO2: 26 mmol/L (ref 22–32)
Calcium: 8.9 mg/dL (ref 8.9–10.3)
Chloride: 103 mmol/L (ref 98–111)
Creatinine, Ser: 1.07 mg/dL (ref 0.61–1.24)
GFR, Estimated: >60 mL/min (ref >60)
Glucose, Bld: 131 mg/dL — ABNORMAL HIGH (ref 70–99)
Potassium: 4 mmol/L (ref 3.5–5.1)
Sodium: 137 mmol/L (ref 135–145)

## 2021-11-07 LAB — GLUCOSE, CAPILLARY
Glucose-Capillary: 148 mg/dL — ABNORMAL HIGH (ref 70–99)
Glucose-Capillary: 159 mg/dL — ABNORMAL HIGH (ref 70–99)
Glucose-Capillary: 139 mg/dL — ABNORMAL HIGH (ref 70–99)
Glucose-Capillary: 127 mg/dL — ABNORMAL HIGH (ref 70–99)

## 2021-11-07 NOTE — Progress Notes (Signed)
PROGRESS NOTE  Kevin [name]  DOB: [birth year]  PCP: [name], MD DXI:[id number]  DOA: 11/05/2021  LOS: 0 days  Hospital Day: 3  Chief Complaint  Patient presents with   Fall    Brief narrative: Kevin [name] is a 75 y.o. male with PMH significant for DM2, HLD, nonischemic cardiomyopathy, second-degree heart block s/p PPM, prostate cancer, chronic anemia, anxiety. Patient was brought to the ED from home for recurrent fall. Patient was recently hospitalized 12/13-12/17 for mechanical fall leading to pelvic hematoma for which he underwent coil embolization of pelvic arteries.  He also had hematuria and urinary retention thought to be related to pelvic hematoma and had a Foley catheter inserted.  Per discharge summary, hematuria resolved and patient passed a voiding trial prior to discharge to CIR on 12/17.  He stayed at [facility] for 10 days.   As previously planned, while in CIR, his Plavix was resumed on 12/23.  Per family, on the day of discharge from [place], he had urinary retention again, Foley catheter was inserted and he was discharged home with that.  At home, he started having hematuria again.  On 12/29, patient had another fall at home.  He was attempting to back out of the bathroom lost his balance and fell backwards.  He did hit his head on the ground, did not lose consciousness.   In the ED, vital signs stable CT of the head showed a small subdural hematoma on the right without any significant mass-effect nor midline shift.   CT of the C-spine showed no acute abnormality.   Chest x-ray showed no acute normality.   Pelvis x-ray showed stable rami fractures.   L-spine x-ray showed degenerative disc disease but no acute abnormalities.  Labs with BUN/creatinine 25/1.26 Urinalysis was very turbid because of large amount of blood.  It also showed rare bacteria and calcium oxalate crystals.    In the ED he was noted to have leaking of blood from the meatus and hence  underwent Foley catheter exchange with good drainage. Because of the presence of subdural hematoma, neurosurgery was consulted.  They recommended to observe overnight and repeat CT scan in 6 to 8 hours.  Subjective: Patient was seen and examined this morning.   Sitting over the edge of the bed.  Not in distress.  No new symptoms.  Urine still with some blood.  Assessment/Plan: Subdural hematoma -Secondary to a fall  -CT head with small right-sided subdural hematoma without mass-effect or midline shift. -As recommended, repeat CT scan of head was obtained in 6 to 8 hours.  It showed stable hematoma without any mass-effect or new areas of hemorrhage or new infarct. -Plavix remains on hold. -Mental status remains intact.  Hematuria History of prostate cancer -Patient has a history of prostate cancer and follows up with urology at [facility].   -Prior to last hospitalization, patient did not have hematuria or Foley catheter in place.  He had hematuria and urine retention in last hospitalization which recurred again in CIR and was discharged with a Foley catheter in place.  -He has an appointment an outpatient at [facility] for voiding trial.  As previously planned, Plavix was resumed on 12/23. -Presented again with hematuria this admission.  Catheter exchanged in the ED with good urine output. -Patient continues to have mild hematuria without obstruction. -12/30, CT hematuria work-up was obtained which did not show any source of hematuria.  Stable known pelvic hematoma. -Plavix is on hold.  Hemoglobin stable. -If hematuria  continues till tomorrow, I will get a urology consultation. Recent Labs    03/13/21 1005 10/20/21 0357 10/20/21 1939 10/21/21 0156 10/22/21 0359 10/24/21 0053 10/26/21 0601 11/05/21 1620 11/06/21 0622 11/07/21 0132  HGB 16.5 14.2 11.4* 10.6* 10.4* 11.4* 11.7* 14.0 13.9 13.4   Recent pelvic hematoma -recently hospitalized 12/13-12/17 for mechanical fall leading to pelvic  hematoma for which he underwent coil embolization of pelvic arteries.   Type 2 diabetes mellitus -A1c 6.7 on 10/22/2021 -Home meds include metformin 5 mg daily -Currently on hold.  Continue sliding scale insulin with Accu-Cheks Recent Labs  Lab 11/06/21 0625 11/06/21 1249 11/06/21 1657 11/07/21 0705 11/07/21 1219  GLUCAP 129* 165* 134* 148* 159*    Chronic stable systolic CHF -Echo from 07/3234 with EF 40-45% and grade 1 diastolic dysfunction with mildly reduced RV function.  Not currently on any diuretics. -Continue to monitor fluid status and renal function  Second-degree AV block -s/p PPM  CKD 3a -Stable creatinine Recent Labs    10/20/21 0357 10/20/21 1938 10/21/21 0156 10/22/21 0359 10/24/21 0053 10/26/21 0601 10/31/21 0720 11/05/21 1620 11/06/21 0622 11/07/21 0132  BUN 15 14 14 12 13 20 21  25* 20 16  CREATININE 1.32* 1.28* 1.25* 1.39* 1.13 1.09 1.18 1.26* 1.18 1.07    Anxiety Mild dementia - Continue home bupropion, Aricept, Namenda  Mobility: Needs repeat PT eval Living condition: Was discharged to home from CIR 2 days prior to presentation Goals of care:   Code Status: Full Code  Nutritional status: Body mass index is 17.95 kg/m.      Diet:  Diet Order             Diet heart healthy/carb modified Room service appropriate? Yes; Fluid consistency: Thin  Diet effective now                  DVT prophylaxis:  SCDs Start: 11/05/21 1854   Antimicrobials: None Fluid: None Consultants: Neurosurgery Family Communication: Discussed with patient's wife this morning.  Status is: Observation  Continue in-hospital care because: Needs further work-up for subdural hematoma, hematuria, may need placement Level of care: Telemetry Medical   Dispo: The patient is from: Home              Anticipated d/c is to: May need placement              Patient currently is not medically stable to d/c.   Difficult to place patient No     Infusions:     Scheduled Meds:  atorvastatin  40 mg Oral QPM   bethanechol  50 mg Oral BID   buPROPion  150 mg Oral BID   And   buPROPion  150 mg Oral q AM   Chlorhexidine Gluconate Cloth  6 each Topical Daily   donepezil  5 mg Oral Daily   insulin aspart  0-9 Units Subcutaneous TID WC   memantine  10 mg Oral BID   sodium chloride flush  3 mL Intravenous Q12H   tetrahydrozoline  1 drop Both Eyes TID    PRN meds: acetaminophen **OR** acetaminophen, HYDROcodone-acetaminophen, polyethylene glycol   Antimicrobials: Anti-infectives (From admission, onward)    None       Objective: Vitals:   11/07/21 0813 11/07/21 1223  BP: 132/76 116/70  Pulse: 89 98  Resp: 18 18  Temp: 98.4 F (36.9 C) 98.3 F (36.8 C)  SpO2: 97% 97%   No intake or output data in the 24 hours ending 11/07/21 [place]  11/05/21 1458  Weight: 66.9 kg   Weight change:  Body mass index is 17.95 kg/m.   Physical Exam: General exam: Pleasant, elderly Caucasian male.  Not in physical distress  skin: No rashes, lesions or ulcers. HEENT: Atraumatic, normocephalic, no obvious bleeding Lungs: Clear to auscultation bilaterally CVS: Regular rate and rhythm, no murmur GI/Abd soft, mild lower abdominal discomfort on palpation, nondistended, bowel sound present CNS: Alert, awake, oriented x3 with some difficulty.  Slow to respond Psychiatry: Mood appropriate Extremities: No pedal edema, no calf tenderness  Data Review: I have personally reviewed the laboratory data and studies available.  F/u labs ordered Unresulted Labs (From admission, onward)     Start     Ordered   11/07/21 0500  CBC with Differential/Platelet  Daily,   R      11/06/21 1010   11/07/21 1324  Basic metabolic panel  Daily,   R      11/06/21 1010   11/05/21 1524  Urine Culture  ONCE - STAT,   STAT       Question:  Indication  Answer:  Acute gross hematuria   11/05/21 1524            Signed, Terrilee Croak, MD [facility] 11/07/2021

## 2021-11-07 NOTE — Progress Notes (Signed)
Physical Therapy Treatment Patient Details Name: Kevin [name] MRN: [id number] DOB: July 03, 1946 Today's Date: 11/07/2021   History of Present Illness 75 y.o. M admited on 12/29 due to a fall where he hit his head, CT shows SDH. Recently hospitalized and in AIR for fall with pubic rami fracture and pelvis hematoma. Prior medical history significant of anemia, anal fissure, anxiety, CHF, sick sinus syndrome and heart block status post pacemaker and AICD, diabetes, hyperlipidemia, prostate cancer.    PT Comments    Improved over initial eval.  Emphasis on transitions, balance while donning shoes, sit to stand safety and progression of gait stability   Recommendations for follow up therapy are one component of a multi-disciplinary discharge planning process, led by the attending physician.  Recommendations may be updated based on patient status, additional functional criteria and insurance authorization.  Follow Up Recommendations  Other (comment) (may progress to HHPT or OPPT if family can provide enough assist)     Assistance Recommended at Discharge Frequent or constant Supervision/Assistance  Equipment Recommendations  Rolling walker (2 wheels)    Recommendations for Other Services       Precautions / Restrictions Precautions Precautions: Fall Precaution Comments: pacemaker, dizzines/tinnitis Restrictions Weight Bearing Restrictions: No RLE Weight Bearing: Weight bearing as tolerated     Mobility  Bed Mobility Overal bed mobility: Needs Assistance Bed Mobility: Supine to Sit;Sit to Supine     Supine to sit: Min guard Sit to supine: Min guard   General bed mobility comments: appears laborious, pt over/under estimates how far to scoot.    Transfers Overall transfer level: Needs assistance Equipment used: Rolling walker (2 wheels) Transfers: Sit to/from Stand Sit to Stand: Min assist           General transfer comment: cues for hand placement/safety,  occasional stability assist    Ambulation/Gait Ambulation/Gait assistance: Min assist;Mod assist Gait Distance (Feet): 110 Feet (x3 with standing rest to reqroup from unsteadiness) Assistive device: Rolling walker (2 wheels) Gait Pattern/deviations: Step-through pattern;Decreased step length - right;Decreased step length - left;Decreased stride length;Narrow base of support;Staggering right   Gait velocity interpretation: 1.31 - 2.62 ft/sec, indicative of limited community ambulator   General Gait Details: mildly unsteady overall, worsening slightly with mm fatigue.  narrowed BOS, feet hitting each other at times,  deviation occuring with scanning.  VC for posture, proximity to the RW and control of speed.   Stairs             Wheelchair Mobility    Modified Rankin (Stroke Patients Only) Modified Rankin (Stroke Patients Only) Pre-Morbid Rankin Score: Moderately severe disability Modified Rankin: Moderately severe disability     Balance Overall balance assessment: Needs assistance Sitting-balance support: No upper extremity supported;Single extremity supported Sitting balance-Leahy Scale: Fair Sitting balance - Comments: pt was able to donn shoes with some instability to the right where he was able to use R UE to arrest fall to the right and continue donning shoes.   Standing balance support: Bilateral upper extremity supported;Reliant on assistive device for balance Standing balance-Leahy Scale: Poor Standing balance comment: reliant on AD and /or external support on occasion.                            Cognition Arousal/Alertness: Awake/alert Behavior During Therapy: WFL for tasks assessed/performed Overall Cognitive Status: History of cognitive impairments - at baseline  Exercises      General Comments General comments (skin integrity, edema, etc.): pt did complain of dizziness at various times  during the session usually coninciding with a balance deviation.      Pertinent Vitals/Pain Pain Assessment: Faces Faces Pain Scale: Hurts a little bit Pain Location: head pressure Pain Descriptors / Indicators: Pressure Pain Intervention(s): Monitored during session    Home Living                          Prior Function            PT Goals (current goals can now be found in the care plan section) Acute Rehab PT Goals Patient Stated Goal: to get better PT Goal Formulation: With patient Time For Goal Achievement: 11/20/21 Potential to Achieve Goals: Good Progress towards PT goals: Progressing toward goals    Frequency    Min 5X/week      PT Plan Current plan remains appropriate    Co-evaluation              AM-PAC PT "6 Clicks" Mobility   Outcome Measure  Help needed turning from your back to your side while in a flat bed without using bedrails?: A Little Help needed moving from lying on your back to sitting on the side of a flat bed without using bedrails?: A Little Help needed moving to and from a bed to a chair (including a wheelchair)?: A Little Help needed standing up from a chair using your arms (e.g., wheelchair or bedside chair)?: A Little Help needed to walk in hospital room?: A Lot Help needed climbing 3-5 steps with a railing? : A Lot 6 Click Score: 16    End of Session   Activity Tolerance: Patient tolerated treatment well Patient left: in bed;with call bell/phone within reach;with bed alarm set Nurse Communication: Mobility status PT Visit Diagnosis: Unsteadiness on feet (R26.81);Other abnormalities of gait and mobility (R26.89);Difficulty in walking, not elsewhere classified (R26.2)     Time: 3149-7026 PT Time Calculation (min) (ACUTE ONLY): 28 min  Charges:  $Gait Training: 8-22 mins $Therapeutic Activity: 8-22 mins                     11/07/2021  Ginger Carne., PT Acute Rehabilitation Services [phone]   (pager) [phone]  (office)   Tessie Fass Safir [place]ec 11/07/2021, 12:31 PM

## 2021-11-08 DIAGNOSIS — S065XAA Traumatic subdural hemorrhage with loss of consciousness status unknown, initial encounter: Secondary | ICD-10-CM | POA: Diagnosis not present

## 2021-11-08 LAB — BASIC METABOLIC PANEL
Anion gap: 6 (ref 5–15)
BUN: 17 mg/dL (ref 8–23)
CO2: 28 mmol/L (ref 22–32)
Calcium: 9 mg/dL (ref 8.9–10.3)
Chloride: 105 mmol/L (ref 98–111)
Creatinine, Ser: 1.16 mg/dL (ref 0.61–1.24)
GFR, Estimated: >60 mL/min (ref >60)
Glucose, Bld: 129 mg/dL — ABNORMAL HIGH (ref 70–99)
Potassium: 4 mmol/L (ref 3.5–5.1)
Sodium: 139 mmol/L (ref 135–145)

## 2021-11-08 LAB — CBC WITH DIFFERENTIAL/PLATELET
Abs Immature Granulocytes: 0.04 10*3/uL (ref 0.00–0.07)
Basophils Absolute: 0 10*3/uL (ref 0.0–0.1)
Basophils Relative: 0 %
Eosinophils Absolute: 0.1 10*3/uL (ref 0.0–0.5)
Eosinophils Relative: 1 %
HCT: 40.4 % (ref 39.0–52.0)
Hemoglobin: 13.5 g/dL (ref 13.0–17.0)
Immature Granulocytes: 1 %
Lymphocytes Relative: 14 %
Lymphs Abs: 1.2 10*3/uL (ref 0.7–4.0)
MCH: 34.1 pg — ABNORMAL HIGH (ref 26.0–34.0)
MCHC: 33.4 g/dL (ref 30.0–36.0)
MCV: 102 fL — ABNORMAL HIGH (ref 80.0–100.0)
Monocytes Absolute: 0.7 10*3/uL (ref 0.1–1.0)
Monocytes Relative: 8 %
Neutro Abs: 6.6 10*3/uL (ref 1.7–7.7)
Neutrophils Relative %: 76 %
Platelets: 227 10*3/uL (ref 150–400)
RBC: 3.96 MIL/uL — ABNORMAL LOW (ref 4.22–5.81)
RDW: 13.4 % (ref 11.5–15.5)
WBC: 8.6 10*3/uL (ref 4.0–10.5)
nRBC: 0 % (ref 0.0–0.2)

## 2021-11-08 LAB — GLUCOSE, CAPILLARY
Glucose-Capillary: 128 mg/dL — ABNORMAL HIGH (ref 70–99)
Glucose-Capillary: 168 mg/dL — ABNORMAL HIGH (ref 70–99)
Glucose-Capillary: 170 mg/dL — ABNORMAL HIGH (ref 70–99)
Glucose-Capillary: 138 mg/dL — ABNORMAL HIGH (ref 70–99)

## 2021-11-08 NOTE — Progress Notes (Signed)
PROGRESS NOTE  Kevin [name]  DOB: [birth year]  PCP: Kevin Post, MD VFI:[id number]  DOA: 11/05/2021  LOS: 0 days  Hospital Day: 4  Chief Complaint  Patient presents with   Fall    Brief narrative: Kevin [name] is a 76 y.o. male with PMH significant for DM2, HLD, nonischemic cardiomyopathy, second-degree heart block s/p PPM, prostate cancer, chronic anemia, anxiety. Patient was brought to the ED from home for recurrent fall. Patient was recently hospitalized 12/13-12/17 for mechanical fall leading to pelvic hematoma for which he underwent coil embolization of pelvic arteries.  He also had hematuria and urinary retention thought to be related to pelvic hematoma and had a Foley catheter inserted.  Per discharge summary, hematuria resolved and patient passed a voiding trial prior to discharge to CIR on 12/17.  He stayed at [facility] for 10 days.   As previously planned, while in CIR, his Plavix was resumed on 12/23.  Per family, on the day of discharge from [place], he had urinary retention again, Foley catheter was inserted and he was discharged home with that.  At home, he started having hematuria again.  On 12/29, patient had another fall at home.  He was attempting to back out of the bathroom lost his balance and fell backwards.  He did hit his head on the ground, did not lose consciousness.   In the ED, vital signs stable CT of the head showed a small subdural hematoma on the right without any significant mass-effect nor midline shift.   CT of the C-spine showed no acute abnormality.   Chest x-ray showed no acute normality.   Pelvis x-ray showed stable rami fractures.   L-spine x-ray showed degenerative disc disease but no acute abnormalities.  Labs with BUN/creatinine 25/1.26 Urinalysis was very turbid because of large amount of blood.  It also showed rare bacteria and calcium oxalate crystals.    In the ED he was noted to have leaking of blood from the meatus and hence  underwent Foley catheter exchange with good drainage. Because of the presence of subdural hematoma, neurosurgery was consulted.  They recommended to observe overnight and repeat CT scan in 6 to 8 hours.  Subjective: Patient was seen and examined this morning.   Sitting over the edge of the bed.  Not in distress.  No new symptoms.   Hematuria seems to be improving.  Has a red tinge to urine today.  Assessment/Plan: Subdural hematoma -Secondary to a fall  -CT head with small right-sided subdural hematoma without mass-effect or midline shift. -As recommended, repeat CT scan of head was obtained in 6 to 8 hours.  It showed stable hematoma without any mass-effect or new areas of hemorrhage or new infarct. -Plavix remains on hold. -Mental status remains intact.  Hematuria History of prostate cancer -Patient has a history of prostate cancer and follows up with urology at [facility].   -Prior to last hospitalization, patient did not have hematuria or Foley catheter in place.  He had hematuria and urine retention in last hospitalization which recurred again in CIR and was discharged with a Foley catheter in place.  -He has an appointment an outpatient at [facility] for voiding trial.  As previously planned, Plavix was resumed on 12/23. -Presented again with hematuria this admission.  Catheter exchanged in the ED with good urine output. -12/30, CT hematuria work-up was obtained which did not show any source of hematuria.  Stable known pelvic hematoma. -Plavix is on hold.  Hemoglobin stable.  Hematuria gradually improving.  I discussed with urology Dr. Gloriann [name] this afternoon.  No neurosurgical intervention at this time.  He suggested restarting finasteride but it seems patient is allergic to that.  We recommended to replace Plavix with baby aspirin once hematuria resolves. Recent Labs    10/20/21 0357 10/20/21 1939 10/21/21 0156 10/22/21 0359 10/24/21 0053 10/26/21 0601 11/05/21 1620 11/06/21 0622 11/07/21 0132  11/08/21 0137  HGB 14.2 11.4* 10.6* 10.4* 11.4* 11.7* 14.0 13.9 13.4 13.5    Recent pelvic hematoma -recently hospitalized 12/13-12/17 for mechanical fall leading to pelvic hematoma for which he underwent coil embolization of pelvic arteries.   Type 2 diabetes mellitus -A1c 6.7 on 10/22/2021 -Home meds include metformin 5 mg daily -Currently on hold.  Continue sliding scale insulin with Accu-Cheks Recent Labs  Lab 11/07/21 1219 11/07/21 1634 11/07/21 2235 11/08/21 0606 11/08/21 1146  GLUCAP 159* 139* 127* 128* 168*    Chronic stable systolic CHF -Echo from 63/8466 with EF 40-45% and grade 1 diastolic dysfunction with mildly reduced RV function.  Not currently on any diuretics. -Continue to monitor fluid status and renal function  Second-degree AV block -s/p PPM  CKD 3a -Stable creatinine Recent Labs    10/20/21 1938 10/21/21 0156 10/22/21 0359 10/24/21 0053 10/26/21 0601 10/31/21 0720 11/05/21 1620 11/06/21 0622 11/07/21 0132 11/08/21 0137  BUN 14 14 12 13 20 21  25* 20 16 17   CREATININE 1.28* 1.25* 1.39* 1.13 1.09 1.18 1.26* 1.18 1.07 1.16    Anxiety Mild dementia - Continue home bupropion, Aricept, Namenda  Mobility: Needs repeat PT eval Living condition: Was discharged to home from CIR 2 days prior to presentation Goals of care:   Code Status: Full Code  Nutritional status: Body mass index is 18.06 kg/m.      Diet:  Diet Order             Diet heart healthy/carb modified Room service appropriate? Yes; Fluid consistency: Thin  Diet effective now                  DVT prophylaxis:  SCDs Start: 11/05/21 1854   Antimicrobials: None Fluid: None Consultants: Neurosurgery Family Communication: Discussed with patient's wife on 12/31  Status is: Observation  Continue in-hospital care because: Pending CIR eval  level of care: Telemetry Medical   Dispo: The patient is from: Home              Anticipated d/c is to: May need placement               Patient currently is not medically stable to d/c.   Difficult to place patient No     Infusions:    Scheduled Meds:  atorvastatin  40 mg Oral QPM   bethanechol  50 mg Oral BID   buPROPion  150 mg Oral BID   And   buPROPion  150 mg Oral q AM   Chlorhexidine Gluconate Cloth  6 each Topical Daily   donepezil  5 mg Oral Daily   insulin aspart  0-9 Units Subcutaneous TID WC   memantine  10 mg Oral BID   sodium chloride flush  3 mL Intravenous Q12H   tetrahydrozoline  1 drop Both Eyes TID    PRN meds: acetaminophen **OR** acetaminophen, HYDROcodone-acetaminophen, polyethylene glycol   Antimicrobials: Anti-infectives (From admission, onward)    None       Objective: Vitals:   11/08/21 0743 11/08/21 1144  BP: 112/73 114/74  Pulse: (!) 101 86  Resp: 14  14  Temp: 98.1 F (36.7 C) 97.8 F (36.6 C)  SpO2: 97% 96%    Intake/Output Summary (Last 24 hours) at 11/08/2021 1410 Last data filed at 11/07/2021 2131 Gross per 24 hour  Intake 123 ml  Output 600 ml  Net -477 ml   Filed Weights   11/05/21 1458 11/08/21 0453  Weight: 66.9 kg 67.3 kg   Weight change:  Body mass index is 18.06 kg/m.   Physical Exam: General exam: Pleasant, elderly Caucasian male.  Not in physical distress.  [place]robag with blood-tinged urine skin: No rashes, lesions or ulcers. HEENT: Atraumatic, normocephalic, no obvious bleeding Lungs: Clear to auscultation bilaterally CVS: Regular rate and rhythm, no murmur GI/Abd soft, mild lower abdominal discomfort on palpation, nondistended, bowel sound present CNS: Alert, awake, oriented x3 with some difficulty.  Slow to respond Psychiatry: Mood appropriate Extremities: No pedal edema, no calf tenderness  Data Review: I have personally reviewed the laboratory data and studies available.  F/u labs ordered Unresulted Labs (From admission, onward)     Start     Ordered   11/07/21 0500  CBC with Differential/Platelet  Daily,   R      11/06/21 1010    11/07/21 7078  Basic metabolic panel  Daily,   R      11/06/21 1010   11/05/21 1524  Urine Culture  ONCE - STAT,   STAT       Question:  Indication  Answer:  Acute gross hematuria   11/05/21 1524            Signed, Terrilee Croak, MD [facility] 11/08/2021

## 2021-11-09 DIAGNOSIS — S065XAA Traumatic subdural hemorrhage with loss of consciousness status unknown, initial encounter: Secondary | ICD-10-CM | POA: Diagnosis not present

## 2021-11-09 LAB — CBC WITH DIFFERENTIAL/PLATELET
Abs Immature Granulocytes: 0.03 10*3/uL (ref 0.00–0.07)
Basophils Absolute: 0 10*3/uL (ref 0.0–0.1)
Basophils Relative: 0 %
Eosinophils Absolute: 0.1 10*3/uL (ref 0.0–0.5)
Eosinophils Relative: 1 %
HCT: 41.2 % (ref 39.0–52.0)
Hemoglobin: 13.5 g/dL (ref 13.0–17.0)
Immature Granulocytes: 0 %
Lymphocytes Relative: 12 %
Lymphs Abs: 0.9 10*3/uL (ref 0.7–4.0)
MCH: 33.9 pg (ref 26.0–34.0)
MCHC: 32.8 g/dL (ref 30.0–36.0)
MCV: 103.5 fL — ABNORMAL HIGH (ref 80.0–100.0)
Monocytes Absolute: 0.7 10*3/uL (ref 0.1–1.0)
Monocytes Relative: 8 %
Neutro Abs: 6.2 10*3/uL (ref 1.7–7.7)
Neutrophils Relative %: 79 %
Platelets: 213 10*3/uL (ref 150–400)
RBC: 3.98 MIL/uL — ABNORMAL LOW (ref 4.22–5.81)
RDW: 13.3 % (ref 11.5–15.5)
WBC: 8 10*3/uL (ref 4.0–10.5)
nRBC: 0 % (ref 0.0–0.2)

## 2021-11-09 LAB — BASIC METABOLIC PANEL
Anion gap: 5 (ref 5–15)
BUN: 19 mg/dL (ref 8–23)
CO2: 28 mmol/L (ref 22–32)
Calcium: 8.9 mg/dL (ref 8.9–10.3)
Chloride: 105 mmol/L (ref 98–111)
Creatinine, Ser: 1.22 mg/dL (ref 0.61–1.24)
GFR, Estimated: >60 mL/min (ref >60)
Glucose, Bld: 177 mg/dL — ABNORMAL HIGH (ref 70–99)
Potassium: 4 mmol/L (ref 3.5–5.1)
Sodium: 138 mmol/L (ref 135–145)

## 2021-11-09 LAB — GLUCOSE, CAPILLARY
Glucose-Capillary: 137 mg/dL — ABNORMAL HIGH (ref 70–99)
Glucose-Capillary: 173 mg/dL — ABNORMAL HIGH (ref 70–99)
Glucose-Capillary: 125 mg/dL — ABNORMAL HIGH (ref 70–99)
Glucose-Capillary: 129 mg/dL — ABNORMAL HIGH (ref 70–99)

## 2021-11-09 MED ORDER — FINASTERIDE 5 MG PO TABS
5.0000 mg | ORAL_TABLET | Freq: Every day | ORAL | Status: DC
  Administered 2021-11-09 – 2021-11-10 (×2): 5 mg via ORAL
  Filled 2021-11-09 (×2): qty 1

## 2021-11-09 MED ORDER — MELATONIN 5 MG PO TABS
10.0000 mg | ORAL_TABLET | Freq: Every evening | ORAL | Status: DC | PRN
  Administered 2021-11-09: 10 mg via ORAL
  Filled 2021-11-09: qty 2

## 2021-11-09 NOTE — Progress Notes (Signed)
PROGRESS NOTE  Kevin [name]  DOB: 14-Oct-1946  PCP: [name], MD HEN:[id number]  DOA: 11/05/2021  LOS: 0 days  Hospital Day: 5  Chief Complaint  Patient presents with   Fall    Brief narrative: Kevin [name] is a 76 y.o. male with PMH significant for DM2, HLD, nonischemic cardiomyopathy, second-degree heart block s/p PPM, prostate cancer, chronic anemia, anxiety. Patient was brought to the ED from home for recurrent fall. Patient was recently hospitalized 12/13-12/17 for mechanical fall leading to pelvic hematoma for which he underwent coil embolization of pelvic arteries.  He also had hematuria and urinary retention thought to be related to pelvic hematoma and had a Foley catheter inserted.  Per discharge summary, hematuria resolved and patient passed a voiding trial prior to discharge to CIR on 12/17.  He stayed at [facility] for 10 days.   As previously planned, while in CIR, his Plavix was resumed on 12/23.  Per family, on the day of discharge from [place], he had urinary retention again, Foley catheter was inserted and he was discharged home with that.  At home, he started having hematuria again.  On 12/29, patient had another fall at home.  He was attempting to back out of the bathroom lost his balance and fell backwards.  He did hit his head on the ground, did not lose consciousness.   In the ED, vital signs stable CT of the head showed a small subdural hematoma on the right without any significant mass-effect nor midline shift.   CT of the C-spine showed no acute abnormality.   Chest x-ray showed no acute normality.   Pelvis x-ray showed stable rami fractures.   L-spine x-ray showed degenerative disc disease but no acute abnormalities.  Labs with BUN/creatinine 25/1.26 Urinalysis was very turbid because of large amount of blood.  It also showed rare bacteria and calcium oxalate crystals.    In the ED he was noted to have leaking of blood from the meatus and hence  underwent Foley catheter exchange with good drainage. Because of the presence of subdural hematoma, neurosurgery was consulted.  They recommended to observe overnight and repeat CT scan in 6 to 8 hours.  Subjective: Patient was seen and examined this morning.   Lying on bed.  Not in distress.  Wife at bedside. Patient continues to have mild hematuria but with a stable hemoglobin.  Assessment/Plan: Subdural hematoma -Secondary to a fall  -CT head with small right-sided subdural hematoma without mass-effect or midline shift. -As recommended, repeat CT scan of head was obtained in 6 to 8 hours.  It showed stable hematoma without any mass-effect or new areas of hemorrhage or new infarct. -Plavix remains on hold because of hematuria.  Once hematuria resolves, he may benefit from switching from Plavix to baby aspirin.  He has no history of ischemic stroke or stenting anywhere. -Mental status remains intact.  Hematuria History of prostate cancer -Patient has a history of prostate cancer and follows up with urology at [facility].   -Prior to last hospitalization, patient did not have hematuria or Foley catheter in place.  He had hematuria and urine retention in last hospitalization which recurred again in CIR and was discharged with a Foley catheter in place.  -He has an appointment an outpatient at [facility] for voiding trial.  As previously planned, Plavix was resumed on 12/23. -Presented again with hematuria this admission.  Catheter exchanged in the ED with good urine output. -12/30, CT hematuria work-up was obtained which  did not show any source of hematuria.  Stable known pelvic hematoma. -Plavix is on hold.  Hemoglobin stable.  Hematuria gradually improving.   -1/1, I discussed with urology Dr. Gloriann Loan.  No neurosurgical intervention at this time.  He suggested starting finasteride.  I checked with patient and his wife.  He is not allergic to finasteride.  I will start him on that. Recent Labs     10/20/21 1939 10/21/21 0156 10/22/21 0359 10/24/21 0053 10/26/21 0601 11/05/21 1620 11/06/21 0622 11/07/21 0132 11/08/21 0137 11/09/21 0209  HGB 11.4* 10.6* 10.4* 11.4* 11.7* 14.0 13.9 13.4 13.5 13.5   Recent pelvic hematoma -recently hospitalized 12/13-12/17 for mechanical fall leading to pelvic hematoma for which he underwent coil embolization of pelvic arteries.   Type 2 diabetes mellitus -A1c 6.7 on 10/22/2021 -Home meds include metformin 500 mg daily -Currently on hold.  Continue sliding scale insulin with Accu-Cheks Recent Labs  Lab 11/08/21 1146 11/08/21 1626 11/08/21 2134 11/09/21 0614 11/09/21 1154  GLUCAP 168* 170* 138* 137* 173*   Chronic stable systolic CHF -Echo from 73/2202 with EF 40-45% and grade 1 diastolic dysfunction with mildly reduced RV function.  Not currently on any diuretics. -Continue to monitor fluid status and renal function  Second-degree AV block -s/p PPM  CKD 3a -Stable creatinine Recent Labs    10/21/21 0156 10/22/21 0359 10/24/21 0053 10/26/21 0601 10/31/21 0720 11/05/21 1620 11/06/21 0622 11/07/21 0132 11/08/21 0137 11/09/21 0209  BUN 14 12 13 20 21  25* 20 16 17 19   CREATININE 1.25* 1.39* 1.13 1.09 1.18 1.26* 1.18 1.07 1.16 1.22   Anxiety Mild dementia - Continue home bupropion, Aricept, Namenda  Mobility: Needs repeat PT eval Living condition: Was discharged to home from CIR 2 days prior to presentation Goals of care:   Code Status: Full Code  Nutritional status: Body mass index is 17.98 kg/m.      Diet:  Diet Order             Diet heart healthy/carb modified Room service appropriate? Yes; Fluid consistency: Thin  Diet effective now                  DVT prophylaxis:  SCDs Start: 11/05/21 1854   Antimicrobials: None Fluid: None Consultants: Neurosurgery Family Communication: Discussed with patient's wife today at bedside  Status is: Observation  Continue in-hospital care because: Pending CIR   level of care: Telemetry Medical   Dispo: The patient is from: Home              Anticipated d/c is to: May need placement              Patient currently is medically stable to d/c.   Difficult to place patient No     Infusions:    Scheduled Meds:  atorvastatin  40 mg Oral QPM   bethanechol  50 mg Oral BID   buPROPion  150 mg Oral BID   And   buPROPion  150 mg Oral q AM   Chlorhexidine Gluconate Cloth  6 each Topical Daily   donepezil  5 mg Oral Daily   finasteride  5 mg Oral Daily   insulin aspart  0-9 Units Subcutaneous TID WC   memantine  10 mg Oral BID   sodium chloride flush  3 mL Intravenous Q12H   tetrahydrozoline  1 drop Both Eyes TID    PRN meds: acetaminophen **OR** acetaminophen, HYDROcodone-acetaminophen, melatonin, polyethylene glycol   Antimicrobials: Anti-infectives (From admission, onward)  None       Objective: Vitals:   11/09/21 0805 11/09/21 1121  BP: 103/65 108/65  Pulse: 93 84  Resp: 16 16  Temp: 98 F (36.7 C) 97.7 F (36.5 C)  SpO2: 96% 97%    Intake/Output Summary (Last 24 hours) at 11/09/2021 1411 Last data filed at 11/09/2021 1300 Gross per 24 hour  Intake 480 ml  Output 650 ml  Net -170 ml   Filed Weights   11/05/21 1458 11/08/21 0453 11/09/21 0400  Weight: 66.9 kg 67.3 kg 67 kg   Weight change: -0.3 kg Body mass index is 17.98 kg/m.   Physical Exam: General exam: Pleasant, elderly Caucasian male.  Not in physical distress.  Urobag with mild blood-tinged urine skin: No rashes, lesions or ulcers. HEENT: Atraumatic, normocephalic, no obvious bleeding Lungs: Clear to auscultation bilaterally CVS: Regular rate and rhythm, no murmur GI/Abd soft, mild lower abdominal discomfort on palpation, nondistended, bowel sound present CNS: Alert, awake, oriented x3 with some difficulty.  Slow to respond Psychiatry: Mood appropriate Extremities: No pedal edema, no calf tenderness  Data Review: I have personally reviewed the  laboratory data and studies available.  F/u labs ordered Unresulted Labs (From admission, onward)     Start     Ordered   11/05/21 1524  Urine Culture  ONCE - STAT,   STAT       Question:  Indication  Answer:  Acute gross hematuria   11/05/21 1524            Signed, Terrilee Croak, MD [facility] 11/09/2021

## 2021-11-09 NOTE — Care Management Obs Status (Signed)
[facility] NOTIFICATION   Patient Details  Name: SAMAAD HASHEM MRN: [id number] Date of Birth: 05/18/1946   Medicare Observation Status Notification Given:  Yes    Pollie Friar, RN 11/09/2021, 3:28 PM

## 2021-11-09 NOTE — Progress Notes (Signed)
Physical Therapy Treatment Patient Details Name: Kevin [name] MRN: [id number] DOB: [birth year] Today's Date: 11/09/2021   History of Present Illness 76 y.o. M admited on 12/29 due to a fall where he hit his head, CT shows SDH. Recently hospitalized and in AIR for fall with pubic rami fracture and pelvis hematoma. Prior medical history significant of anemia, anal fissure, anxiety, CHF, sick sinus syndrome and heart block status post pacemaker and AICD, diabetes, hyperlipidemia, prostate cancer.    PT Comments    Pt received in supine, agreeable to therapy session and with good participation and fair tolerance for gait and transfer training. Pt limited due to symptomatic orthostatic hypotension/dizziness this session and emphasis on reciprocal transfers from chair for BLE strengthening and reinforcement of safe hand placement. Pt with decreased insight into deficits and noted short term memory deficit, he initially was unable to recall that he had been to inpatient rehab unit and had gone home after recent hospitalization.  Orthostatic BPs Sitting 119/73 (88); 101 HR  Standing 85/66 (74); HR 115 bpm  Sitting after standing 129/104 (110)  Pt reports minimal fatigue at end of session, encouraged him to get OOB to chair multiple times a day and continue hydration and LE therex for improved hemodynamics. May consider compression stockings in future sessions to see if this improves BP stability. Pt continues to benefit from PT services to progress toward functional mobility goals.   Recommendations for follow up therapy are one component of a multi-disciplinary discharge planning process, led by the attending physician.  Recommendations may be updated based on patient status, additional functional criteria and insurance authorization.  Follow Up Recommendations  Acute inpatient rehab (3hours/day)     Assistance Recommended at Discharge Frequent or constant Supervision/Assistance  Equipment  Recommendations  Rolling walker (2 wheels)    Recommendations for Other Services Rehab consult     Precautions / Restrictions Precautions Precautions: Fall Precaution Comments: pacemaker, dizzines/tinnitis Restrictions Weight Bearing Restrictions: Yes RLE Weight Bearing: Weight bearing as tolerated     Mobility  Bed Mobility Overal bed mobility: Needs Assistance Bed Mobility: Supine to Sit     Supine to sit: Min guard     General bed mobility comments: appears laborious, pt over/under estimates how far to scoot.    Transfers Overall transfer level: Needs assistance Equipment used: Rolling walker (2 wheels) Transfers: Sit to/from Stand Sit to Stand: Min assist           General transfer comment: cues for hand placement/safety, occasional stability assist. minA from low toilet and initially, as little as min guard with reciprocal transfers from chair    Ambulation/Gait Ambulation/Gait assistance: Min assist Gait Distance (Feet): 23 Feet (63ft to toilet, then 8ft back to chair) Assistive device: Rolling walker (2 wheels) Gait Pattern/deviations: Step-through pattern;Decreased step length - right;Decreased step length - left;Decreased stride length;Narrow base of support;Staggering right Gait velocity: decreased     General Gait Details: narrow BOS and increased postural sway, pt needing up to minA for balance and RW use, pt c/o dizziness so distance limited for BP assessment and + orthostatics    Modified Rankin (Stroke Patients Only) Modified Rankin (Stroke Patients Only) Pre-Morbid Rankin Score: Moderately severe disability Modified Rankin: Moderately severe disability     Balance Overall balance assessment: Needs assistance;History of Falls Sitting-balance support: No upper extremity supported;Single extremity supported Sitting balance-Leahy Scale: Fair Sitting balance - Comments: fair static sitting   Standing balance support: Bilateral upper  extremity supported;Reliant on assistive device for balance  Standing balance-Leahy Scale: Poor Standing balance comment: reliant on AD and /or external support on occasion.                  Cognition Arousal/Alertness: Awake/alert Behavior During Therapy: WFL for tasks assessed/performed Overall Cognitive Status: Impaired/Different from baseline Area of Impairment: Memory;Following commands;Safety/judgement;Awareness;Problem solving                   Current Attention Level: Sustained Memory: Decreased short-term memory;Decreased recall of precautions Following Commands: Follows one step commands with increased time;Follows multi-step commands inconsistently Safety/Judgement: Decreased awareness of safety;Decreased awareness of deficits Awareness: Emergent Problem Solving: Requires verbal cues General Comments: Pt with decreased insight into safety, given reasoning for safe hand placement with transfers (not to push on RW with both hands) then pt immediately ignoring cues and attempting to unsafely push up on RW.        Exercises Other Exercises Other Exercises: STS x 5 reps x 2 sets (10 total) from recliner Other Exercises: standing hip flexion x10 reps    General Comments General comments (skin integrity, edema, etc.): see orthostatics above      Pertinent Vitals/Pain Pain Assessment: No/denies pain Pain Intervention(s): Monitored during session;Repositioned     PT Goals (current goals can now be found in the care plan section) Acute Rehab PT Goals Patient Stated Goal: to get better and less dizziness PT Goal Formulation: With patient Time For Goal Achievement: 11/20/21 Progress towards PT goals: Progressing toward goals    Frequency    Min 5X/week      PT Plan Current plan remains appropriate       AM-PAC PT "6 Clicks" Mobility   Outcome Measure  Help needed turning from your back to your side while in a flat bed without using bedrails?: A  Little Help needed moving from lying on your back to sitting on the side of a flat bed without using bedrails?: A Little Help needed moving to and from a bed to a chair (including a wheelchair)?: A Little Help needed standing up from a chair using your arms (e.g., wheelchair or bedside chair)?: A Lot (mod cues for safety/technique) Help needed to walk in hospital room?: A Lot (mod cues for safety) Help needed climbing 3-5 steps with a railing? : Total 6 Click Score: 14    End of Session Equipment Utilized During Treatment: Gait belt Activity Tolerance: Patient tolerated treatment well Patient left: in chair;with call bell/phone within reach;with chair alarm set Nurse Communication: Mobility status;Other (comment);Precautions (sx orthostatic hypotension) PT Visit Diagnosis: Unsteadiness on feet (R26.81);Other abnormalities of gait and mobility (R26.89);Difficulty in walking, not elsewhere classified (R26.2)     Time: 1655-3748 PT Time Calculation (min) (ACUTE ONLY): 44 min  Charges:  $Gait Training: 8-22 mins $Therapeutic Exercise: 8-22 mins $Therapeutic Activity: 8-22 mins                     Sharyah Bostwick P., PTA Acute Rehabilitation Services Pager: [phone] Office: [place] 11/09/2021, 5:41 PM

## 2021-11-09 NOTE — Clinical Note (Incomplete)
Inpatient Rehab Admissions Coordinator:   I do not yet have insurance auth or a CIR bed for this Pt. Today. I will continue to follow and pursue for potential admit pending

## 2021-11-09 NOTE — Progress Notes (Signed)
Occupational Therapy Treatment Patient Details Name: Kevin [name] MRN: [id number] DOB: 10-16-1946 Today's Date: 11/09/2021   History of present illness 76 y.o. M admited on 12/29 due to a fall where he hit his head, CT shows SDH. Recently hospitalized and in AIR for fall with pubic rami fracture and pelvis hematoma. Prior medical history significant of anemia, anal fissure, anxiety, CHF, sick sinus syndrome and heart block status post pacemaker and AICD, diabetes, hyperlipidemia, prostate cancer.   OT comments  Pt making steady progress toward goals. Requires min A with mobility and Mod A with ADL tasks due to deficits listed below @ RW level. Pt with significant cognitive impairment as indicated by his score noted below on the Short Blessed Test. 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 Given pt's high functional level PTA, feel pt is an excellent candidate for AIR to maximize his functional level of independence and reduce risk of falls. Acute OT to continue to follow.    Recommendations for follow up therapy are one component of a multi-disciplinary discharge planning process, led by the attending physician.  Recommendations may be updated based on patient status, additional functional criteria and insurance authorization.    Follow Up Recommendations       Assistance Recommended at Discharge Frequent or constant Supervision/Assistance  Equipment Recommendations   (TBD)    Recommendations for Other Services Rehab consult    Precautions / Restrictions Precautions Precautions: Fall Precaution Comments: pacemaker, dizzines/tinnitis Restrictions RLE Weight Bearing: Weight bearing as tolerated       Mobility Bed Mobility Overal bed mobility: Needs Assistance Bed Mobility: Supine to Sit;Sit to Supine     Supine to sit: Supervision          Transfers Overall transfer level: Needs  assistance Equipment used: Rolling walker (2 wheels) Transfers: Sit to/from Stand Sit to Stand: Min assist   Step pivot transfers: Min assist             Balance Overall balance assessment: Needs assistance;History of Falls Sitting-balance support: No upper extremity supported;Single extremity supported Sitting balance-Leahy Scale: Fair Sitting balance - Comments: pt was able to donn shoes with some instability to the right where he was able to use R UE to arrest fall to the right and continue donning shoes.   Standing balance support: Bilateral upper extremity supported;Reliant on assistive device for balance Standing balance-Leahy Scale: Poor Standing balance comment: reliant on AD and /or external support on occasion.                           ADL either performed or assessed with clinical judgement   ADL Overall ADL's : Needs assistance/impaired Eating/Feeding: Set up;Sitting   Grooming: Set up;Sitting   Upper Body Bathing: Minimal assistance;Sitting   Lower Body Bathing: Sit to/from stand;Sitting/lateral leans;Moderate assistance   Upper Body Dressing : Min guard;Sitting   Lower Body Dressing: Moderate assistance;Sitting/lateral leans;Sit to/from stand Lower Body Dressing Details (indicate cue type and reason): able to demo bending to reach top of socks as pt reports this is normally how he gets dressed. will need increased assist in standing to maintain balance/don over waist, as well as sequencing cues Toilet Transfer: Ambulation;Minimal assistance   Toileting- Clothing Manipulation and Hygiene: Total assistance (indwelling foley)       Functional mobility during ADLs: Minimal assistance;Moderate assistance;Rolling walker (2 wheels)  Extremity/Trunk Assessment              Vision   Vision Assessment?: No apparent visual deficits (will further assess)   Perception Perception Perception: Within Functional Limits   Praxis Praxis Praxis:  Intact    Cognition Arousal/Alertness: Awake/alert Behavior During Therapy: WFL for tasks assessed/performed Overall Cognitive Status: Impaired/Different from baseline Area of Impairment: Memory;Following commands;Safety/judgement;Awareness;Problem solving                 Orientation Level: Disoriented to;Time;Situation (pt states he was admitted this time due to falling in the middle of the night when going outside to get the mail; pt acutally fell coing out of the bathroom) Current Attention Level: Sustained Memory: Decreased short-term memory Following Commands: Follows one step commands with increased time;Follows multi-step commands inconsistently Safety/Judgement: Decreased awareness of safety;Decreased awareness of deficits Awareness: Emergent Problem Solving: Requires verbal cues General Comments: Assessed with the Short Blessed and scored a 22/28 indicatingsignificant cognitive impariment as compared to his baseline          Exercises     Shoulder Instructions       General Comments      Pertinent Vitals/ Pain       Pain Assessment: No/denies pain  Home Living                                          Prior Functioning/Environment              Frequency  Min 2X/week        Progress Toward Goals  OT Goals(current goals can now be found in the care plan section)  Progress towards OT goals: Progressing toward goals  Acute Rehab OT Goals Patient Stated Goal: 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 OT Goal Formulation: With patient Time For Goal Achievement: 11/20/21 Potential to Achieve Goals: Good ADL Goals Pt Will Perform Grooming: with supervision;standing Pt Will Perform Lower Body Bathing: with min guard assist;sit to/from stand;sitting/lateral leans Pt Will Perform Lower Body Dressing: with min guard assist;sitting/lateral leans;sit to/from stand Pt Will Transfer to Toilet: with min guard assist;ambulating Pt Will  Perform Toileting - Clothing Manipulation and hygiene: with min guard assist;sitting/lateral leans;sit to/from stand Additional ADL Goal #1: Pt will verbalize 3 fall prevention techniques that he can use at home.  Plan Discharge plan remains appropriate    Co-evaluation                 AM-PAC OT "6 Clicks" Daily Activity     Outcome Measure   Help from another person eating meals?: None Help from another person taking care of personal grooming?: A Little Help from another person toileting, which includes using toliet, bedpan, or urinal?: A Lot Help from another person bathing (including washing, rinsing, drying)?: A Lot Help from another person to put on and taking off regular upper body clothing?: A Little Help from another person to put on and taking off regular lower body clothing?: A Lot 6 Click Score: 16    End of Session Equipment Utilized During Treatment: Gait belt;Rolling walker (2 wheels)  OT Visit Diagnosis: Unsteadiness on feet (R26.81);Other abnormalities of gait and mobility (R26.89);Muscle weakness (generalized) (M62.81);Other symptoms and signs involving cognitive function   Activity Tolerance Patient tolerated treatment well   Patient Left in chair;with call bell/phone within reach;with chair alarm set   Nurse Communication  Mobility status;Other (comment) (Foley leaking blood and urine)        Time: 9458-5929 OT Time Calculation (min): 23 min  Charges: OT General Charges $OT Visit: 1 Visit OT Treatments $Self Care/Home Management : 23-37 mins  Maurie Boettcher, OT/L   Acute OT Clinical Specialist [place] Pager [phone] Office [phone]   [facility] 11/09/2021, 1:01 PM

## 2021-11-09 NOTE — Plan of Care (Signed)

## 2021-11-10 ENCOUNTER — Inpatient Hospital Stay: Payer: Medicare HMO | Admitting: Family Medicine

## 2021-11-10 ENCOUNTER — Inpatient Hospital Stay (HOSPITAL_COMMUNITY)
Admission: RE | Admit: 2021-11-10 | Discharge: 2021-11-20 | DRG: 092 | Disposition: A | Discharge status: Home Health | Payer: No Typology Code available for payment source | Source: Intra-hospital | Attending: Physical Medicine & Rehabilitation | Admitting: Physical Medicine & Rehabilitation

## 2021-11-10 ENCOUNTER — Other Ambulatory Visit: Payer: Self-pay

## 2021-11-10 ENCOUNTER — Encounter (HOSPITAL_COMMUNITY): Payer: Self-pay | Admitting: Internal Medicine

## 2021-11-10 ENCOUNTER — Encounter (HOSPITAL_COMMUNITY): Payer: Self-pay | Admitting: Physical Medicine & Rehabilitation

## 2021-11-10 DIAGNOSIS — I428 Other cardiomyopathies: Secondary | ICD-10-CM | POA: Diagnosis present

## 2021-11-10 DIAGNOSIS — E119 Type 2 diabetes mellitus without complications: Secondary | ICD-10-CM | POA: Diagnosis not present

## 2021-11-10 DIAGNOSIS — S065XAA Traumatic subdural hemorrhage with loss of consciousness status unknown, initial encounter: Secondary | ICD-10-CM

## 2021-11-10 DIAGNOSIS — Z87891 Personal history of nicotine dependence: Secondary | ICD-10-CM

## 2021-11-10 DIAGNOSIS — Z806 Family history of leukemia: Secondary | ICD-10-CM | POA: Diagnosis not present

## 2021-11-10 DIAGNOSIS — I5042 Chronic combined systolic (congestive) and diastolic (congestive) heart failure: Secondary | ICD-10-CM | POA: Diagnosis not present

## 2021-11-10 DIAGNOSIS — Z8041 Family history of malignant neoplasm of ovary: Secondary | ICD-10-CM

## 2021-11-10 DIAGNOSIS — N3289 Other specified disorders of bladder: Secondary | ICD-10-CM | POA: Diagnosis not present

## 2021-11-10 DIAGNOSIS — H9319 Tinnitus, unspecified ear: Secondary | ICD-10-CM | POA: Diagnosis present

## 2021-11-10 DIAGNOSIS — E1169 Type 2 diabetes mellitus with other specified complication: Secondary | ICD-10-CM

## 2021-11-10 DIAGNOSIS — F02818 Dementia in other diseases classified elsewhere, unspecified severity, with other behavioral disturbance: Secondary | ICD-10-CM | POA: Diagnosis present

## 2021-11-10 DIAGNOSIS — G4700 Insomnia, unspecified: Secondary | ICD-10-CM | POA: Diagnosis present

## 2021-11-10 DIAGNOSIS — Z8249 Family history of ischemic heart disease and other diseases of the circulatory system: Secondary | ICD-10-CM

## 2021-11-10 DIAGNOSIS — I441 Atrioventricular block, second degree: Secondary | ICD-10-CM | POA: Diagnosis present

## 2021-11-10 DIAGNOSIS — G309 Alzheimer's disease, unspecified: Secondary | ICD-10-CM | POA: Diagnosis present

## 2021-11-10 DIAGNOSIS — C61 Malignant neoplasm of prostate: Secondary | ICD-10-CM | POA: Diagnosis present

## 2021-11-10 DIAGNOSIS — S065X0S Traumatic subdural hemorrhage without loss of consciousness, sequela: Secondary | ICD-10-CM | POA: Diagnosis present

## 2021-11-10 DIAGNOSIS — S065X0A Traumatic subdural hemorrhage without loss of consciousness, initial encounter: Secondary | ICD-10-CM | POA: Diagnosis not present

## 2021-11-10 DIAGNOSIS — N390 Urinary tract infection, site not specified: Secondary | ICD-10-CM | POA: Diagnosis not present

## 2021-11-10 DIAGNOSIS — Z95810 Presence of automatic (implantable) cardiac defibrillator: Secondary | ICD-10-CM

## 2021-11-10 DIAGNOSIS — R64 Cachexia: Secondary | ICD-10-CM | POA: Diagnosis present

## 2021-11-10 DIAGNOSIS — R636 Underweight: Secondary | ICD-10-CM | POA: Diagnosis present

## 2021-11-10 DIAGNOSIS — H532 Diplopia: Secondary | ICD-10-CM | POA: Diagnosis present

## 2021-11-10 DIAGNOSIS — Z882 Allergy status to sulfonamides status: Secondary | ICD-10-CM

## 2021-11-10 DIAGNOSIS — W1830XD Fall on same level, unspecified, subsequent encounter: Secondary | ICD-10-CM

## 2021-11-10 DIAGNOSIS — K5900 Constipation, unspecified: Secondary | ICD-10-CM | POA: Diagnosis present

## 2021-11-10 DIAGNOSIS — R339 Retention of urine, unspecified: Secondary | ICD-10-CM

## 2021-11-10 DIAGNOSIS — Z79899 Other long term (current) drug therapy: Secondary | ICD-10-CM

## 2021-11-10 DIAGNOSIS — R69 Illness, unspecified: Secondary | ICD-10-CM | POA: Diagnosis not present

## 2021-11-10 DIAGNOSIS — E785 Hyperlipidemia, unspecified: Secondary | ICD-10-CM

## 2021-11-10 DIAGNOSIS — E7800 Pure hypercholesterolemia, unspecified: Secondary | ICD-10-CM | POA: Diagnosis present

## 2021-11-10 DIAGNOSIS — G479 Sleep disorder, unspecified: Secondary | ICD-10-CM | POA: Diagnosis not present

## 2021-11-10 DIAGNOSIS — I447 Left bundle-branch block, unspecified: Secondary | ICD-10-CM | POA: Diagnosis present

## 2021-11-10 DIAGNOSIS — Z888 Allergy status to other drugs, medicaments and biological substances status: Secondary | ICD-10-CM

## 2021-11-10 DIAGNOSIS — F02A4 Dementia in other diseases classified elsewhere, mild, with anxiety: Secondary | ICD-10-CM | POA: Diagnosis not present

## 2021-11-10 DIAGNOSIS — Z85828 Personal history of other malignant neoplasm of skin: Secondary | ICD-10-CM

## 2021-11-10 DIAGNOSIS — I959 Hypotension, unspecified: Secondary | ICD-10-CM | POA: Diagnosis not present

## 2021-11-10 DIAGNOSIS — Z681 Body mass index (BMI) 19 or less, adult: Secondary | ICD-10-CM | POA: Diagnosis not present

## 2021-11-10 DIAGNOSIS — Z7984 Long term (current) use of oral hypoglycemic drugs: Secondary | ICD-10-CM

## 2021-11-10 DIAGNOSIS — F0284 Dementia in other diseases classified elsewhere, unspecified severity, with anxiety: Secondary | ICD-10-CM | POA: Diagnosis present

## 2021-11-10 DIAGNOSIS — B961 Klebsiella pneumoniae [K. pneumoniae] as the cause of diseases classified elsewhere: Secondary | ICD-10-CM | POA: Diagnosis not present

## 2021-11-10 DIAGNOSIS — G301 Alzheimer's disease with late onset: Secondary | ICD-10-CM | POA: Diagnosis not present

## 2021-11-10 LAB — GLUCOSE, CAPILLARY
Glucose-Capillary: 195 mg/dL — ABNORMAL HIGH (ref 70–99)
Glucose-Capillary: 192 mg/dL — ABNORMAL HIGH (ref 70–99)
Glucose-Capillary: 154 mg/dL — ABNORMAL HIGH (ref 70–99)
Glucose-Capillary: 128 mg/dL — ABNORMAL HIGH (ref 70–99)
Glucose-Capillary: 122 mg/dL — ABNORMAL HIGH (ref 70–99)
Glucose-Capillary: 141 mg/dL — ABNORMAL HIGH (ref 70–99)

## 2021-11-10 MED ORDER — OXYBUTYNIN CHLORIDE 5 MG PO TABS
5.0000 mg | ORAL_TABLET | Freq: Two times a day (BID) | ORAL | Status: DC
  Administered 2021-11-10: 5 mg via ORAL
  Filled 2021-11-10: qty 1

## 2021-11-10 MED ORDER — FLEET ENEMA 7-19 GM/118ML RE ENEM: 1.0000 | ENEMA | Freq: Once | RECTAL | Status: DC | PRN

## 2021-11-10 MED ORDER — FINASTERIDE 5 MG PO TABS
5.0000 mg | ORAL_TABLET | Freq: Every day | ORAL | Status: DC
  Administered 2021-11-11 – 2021-11-13 (×3): 5 mg via ORAL
  Filled 2021-11-10 (×3): qty 1

## 2021-11-10 MED ORDER — MAGNESIUM GLUCONATE 500 MG PO TABS
250.0000 mg | ORAL_TABLET | Freq: Every day | ORAL | Status: DC
  Administered 2021-11-10 – 2021-11-19 (×10): 250 mg via ORAL
  Filled 2021-11-10 (×10): qty 1

## 2021-11-10 MED ORDER — PROCHLORPERAZINE EDISYLATE 10 MG/2ML IJ SOLN: 5.0000 mg | Freq: Four times a day (QID) | INTRAMUSCULAR | Status: DC | PRN

## 2021-11-10 MED ORDER — ATORVASTATIN CALCIUM 40 MG PO TABS
40.0000 mg | ORAL_TABLET | Freq: Every evening | ORAL | Status: DC
Start: 2021-11-10 — End: 2021-11-20
  Administered 2021-11-10 – 2021-11-19 (×10): 40 mg via ORAL
  Filled 2021-11-10 (×11): qty 1

## 2021-11-10 MED ORDER — PROCHLORPERAZINE 25 MG RE SUPP: 12.5000 mg | Freq: Four times a day (QID) | RECTAL | Status: DC | PRN

## 2021-11-10 MED ORDER — LIDOCAINE HCL URETHRAL/MUCOSAL 2 % EX GEL: CUTANEOUS | Status: DC | PRN

## 2021-11-10 MED ORDER — ACETAMINOPHEN 325 MG PO TABS
325.0000 mg | ORAL_TABLET | ORAL | Status: DC | PRN
  Administered 2021-11-11 – 2021-11-19 (×5): 650 mg via ORAL
  Filled 2021-11-10 (×6): qty 2

## 2021-11-10 MED ORDER — BUPROPION HCL ER (XL) 150 MG PO TB24
150.0000 mg | ORAL_TABLET | Freq: Two times a day (BID) | ORAL | Status: DC
  Administered 2021-11-10 – 2021-11-20 (×19): 150 mg via ORAL
  Filled 2021-11-10 (×21): qty 1

## 2021-11-10 MED ORDER — BISACODYL 10 MG RE SUPP
10.0000 mg | Freq: Every day | RECTAL | Status: DC | PRN
  Administered 2021-11-13: 10 mg via RECTAL
  Filled 2021-11-10: qty 1

## 2021-11-10 MED ORDER — OXYBUTYNIN CHLORIDE 5 MG PO TABS
5.0000 mg | ORAL_TABLET | Freq: Two times a day (BID) | ORAL | Status: DC
  Administered 2021-11-10 – 2021-11-19 (×18): 5 mg via ORAL
  Filled 2021-11-10 (×18): qty 1

## 2021-11-10 MED ORDER — METFORMIN HCL 500 MG PO TABS
500.0000 mg | ORAL_TABLET | Freq: Every day | ORAL | Status: DC
  Administered 2021-11-10: 500 mg via ORAL
  Filled 2021-11-10: qty 1

## 2021-11-10 MED ORDER — TRAZODONE HCL 50 MG PO TABS: 25.0000 mg | ORAL_TABLET | Freq: Every evening | ORAL | Status: DC | PRN

## 2021-11-10 MED ORDER — ONDANSETRON HCL 4 MG/2ML IJ SOLN
4.0000 mg | Freq: Four times a day (QID) | INTRAMUSCULAR | Status: DC | PRN
  Administered 2021-11-10: 4 mg via INTRAVENOUS
  Filled 2021-11-10: qty 2

## 2021-11-10 MED ORDER — LORATADINE 10 MG PO TABS
10.0000 mg | ORAL_TABLET | Freq: Every day | ORAL | Status: DC
  Administered 2021-11-10 – 2021-11-20 (×11): 10 mg via ORAL
  Filled 2021-11-10 (×11): qty 1

## 2021-11-10 MED ORDER — HYDROCODONE-ACETAMINOPHEN 5-325 MG PO TABS
1.0000 | ORAL_TABLET | ORAL | Status: DC | PRN
  Administered 2021-11-13 – 2021-11-14 (×3): 1 via ORAL
  Filled 2021-11-10 (×4): qty 1

## 2021-11-10 MED ORDER — OXYBUTYNIN CHLORIDE 5 MG PO TABS: 5.0000 mg | ORAL_TABLET | Freq: Two times a day (BID) | ORAL | Status: DC

## 2021-11-10 MED ORDER — DIPHENHYDRAMINE HCL 12.5 MG/5ML PO ELIX
12.5000 mg | ORAL_SOLUTION | Freq: Four times a day (QID) | ORAL | Status: DC | PRN
  Administered 2021-11-13 – 2021-11-14 (×2): 25 mg via ORAL
  Filled 2021-11-10 (×3): qty 10

## 2021-11-10 MED ORDER — BUPROPION HCL ER (XL) 150 MG PO TB24
150.0000 mg | ORAL_TABLET | Freq: Every morning | ORAL | Status: DC
  Administered 2021-11-11 – 2021-11-20 (×10): 150 mg via ORAL
  Filled 2021-11-10 (×10): qty 1

## 2021-11-10 MED ORDER — METFORMIN HCL 500 MG PO TABS
500.0000 mg | ORAL_TABLET | Freq: Two times a day (BID) | ORAL | Status: DC
  Administered 2021-11-11 – 2021-11-20 (×19): 500 mg via ORAL
  Filled 2021-11-10 (×19): qty 1

## 2021-11-10 MED ORDER — MELATONIN 10 MG PO TABS: 10.0000 mg | ORAL_TABLET | Freq: Every evening | ORAL | 0 refills | Status: DC | PRN

## 2021-11-10 MED ORDER — MEMANTINE HCL 10 MG PO TABS
10.0000 mg | ORAL_TABLET | Freq: Two times a day (BID) | ORAL | Status: DC
  Administered 2021-11-10 – 2021-11-20 (×20): 10 mg via ORAL
  Filled 2021-11-10 (×21): qty 1

## 2021-11-10 MED ORDER — PROCHLORPERAZINE MALEATE 5 MG PO TABS
5.0000 mg | ORAL_TABLET | Freq: Four times a day (QID) | ORAL | Status: DC | PRN
  Administered 2021-11-14: 10 mg via ORAL
  Filled 2021-11-10: qty 2

## 2021-11-10 MED ORDER — TETRAHYDROZOLINE HCL 0.05 % OP SOLN
1.0000 [drp] | Freq: Three times a day (TID) | OPHTHALMIC | Status: DC
  Administered 2021-11-10 – 2021-11-20 (×29): 1 [drp] via OPHTHALMIC
  Filled 2021-11-10: qty 15

## 2021-11-10 MED ORDER — FINASTERIDE 5 MG PO TABS: 5.0000 mg | ORAL_TABLET | Freq: Every day | ORAL | Status: DC

## 2021-11-10 MED ORDER — DONEPEZIL HCL 10 MG PO TABS
5.0000 mg | ORAL_TABLET | Freq: Every day | ORAL | Status: DC
Start: 2021-11-11 — End: 2021-11-20
  Administered 2021-11-11 – 2021-11-20 (×10): 5 mg via ORAL
  Filled 2021-11-10 (×10): qty 1

## 2021-11-10 MED ORDER — METFORMIN HCL 500 MG PO TABS: 500.0000 mg | ORAL_TABLET | Freq: Every day | ORAL | Status: DC

## 2021-11-10 MED ORDER — MELATONIN 5 MG PO TABS
10.0000 mg | ORAL_TABLET | Freq: Every evening | ORAL | Status: DC | PRN
  Administered 2021-11-12 – 2021-11-19 (×4): 10 mg via ORAL
  Filled 2021-11-10 (×5): qty 2

## 2021-11-10 MED ORDER — ALUM & MAG HYDROXIDE-SIMETH 200-200-20 MG/5ML PO SUSP: 30.0000 mL | ORAL | Status: DC | PRN

## 2021-11-10 MED ORDER — NAPHAZOLINE-GLYCERIN 0.012-0.25 % OP SOLN
1.0000 [drp] | Freq: Four times a day (QID) | OPHTHALMIC | Status: DC | PRN
  Filled 2021-11-10: qty 15

## 2021-11-10 MED ORDER — INSULIN ASPART 100 UNIT/ML IJ SOLN
0.0000 [IU] | Freq: Three times a day (TID) | INTRAMUSCULAR | Status: DC
  Administered 2021-11-10 – 2021-11-12 (×5): 1 [IU] via SUBCUTANEOUS
  Administered 2021-11-12: 2 [IU] via SUBCUTANEOUS
  Administered 2021-11-13 – 2021-11-19 (×6): 1 [IU] via SUBCUTANEOUS

## 2021-11-10 MED ORDER — GUAIFENESIN-DM 100-10 MG/5ML PO SYRP: 5.0000 mL | ORAL_SOLUTION | Freq: Four times a day (QID) | ORAL | Status: DC | PRN

## 2021-11-10 MED ORDER — CLOBETASOL PROPIONATE 0.05 % EX OINT
1.0000 "application " | TOPICAL_OINTMENT | Freq: Every day | CUTANEOUS | Status: DC | PRN
  Filled 2021-11-10 (×2): qty 15

## 2021-11-10 MED ORDER — POLYETHYLENE GLYCOL 3350 17 G PO PACK: 17.0000 g | PACK | Freq: Every day | ORAL | Status: DC | PRN

## 2021-11-10 NOTE — H&P (Addendum)
Physical Medicine and Rehabilitation Admission H&P    Chief Complaint  Patient presents with   Functional deficits due to fall w/SDH    HPI: Kevin [name] is a 76 year old male with history of NICM, agent orange exposure, LBBB s/p PPM, T2DM, dementia?, prostate cancer, recent admission for fall complicated by pubic fracture and pelvic hematoma s/p coil embolization and urinary retention requiring foley. He was underwent CIR, was discharged to home on 11/03/21 with foley due to ongoing voiding issues as well as recommendations for CG to min assist. He was admitted on . He was 12/29 after a fall backwards while coming out of the bathroom and gross hematuria noted in foley bag.  He was found to have small anterior falcine SDH which was stable on follow up CT per NS recs. Plavix discontinued and no neurologic changes noted.   CT stone was negative for cause of hematuria, ongoing bladder displacement to the left and no change in pelvis sidewall hematoma. Foley changed out in ED. Case discussed with Dr. Gloriann Loan who recommended addition of finasteride. UA showed hematuria with stable H/H and leucocytosis has resolved.  Patient reports history of dizziness, tinnitus and occasional double vision for years and now has anxiety with increased fear of falling. Therapy initiated and patient noted to have balance deficits with poor posture, cognitive deficits with delay in processing simple one step commands as well as significant cognitive deficits. CIR recommended due to functional decline. Wife is looking into hiring help at home.    Review of Systems  Constitutional:  Negative for malaise/fatigue.  HENT:  Negative for hearing loss and tinnitus.   Eyes:  Positive for double vision (occasionally).  Respiratory:  Negative for cough and shortness of breath.   Cardiovascular:  Negative for chest pain and palpitations.  Gastrointestinal:  Positive for abdominal pain and nausea. Negative for heartburn.   Genitourinary:  Positive for hematuria. Negative for dysuria.  Musculoskeletal:  Positive for joint pain.  Neurological:  Positive for dizziness (occasionally), weakness and headaches (worse since fall).  Psychiatric/Behavioral:  Positive for depression and memory loss.     Past Medical History:  Diagnosis Date   Agent orange exposure    in [place]   Anemia    Dementia ([place])    Heart block AV second degree    a. s/p PPM implant with subsequent CRTD upgrade   High cholesterol    History of colon polyps    LBBB (left bundle branch block)    Nonischemic cardiomyopathy ([place])    a. MDT CRTD upgrade 2016   Pacemaker 08/27/2013   Dual-chamber Medtronic Adapta implanted January 2013 for bradycardia with alternating bundle branch block and 2:1 atrioventricular block    Prostate cancer ([place])    "not been treated yet; on a wait and see" (01/22/2015)   Type II diabetes mellitus ([place])     Past Surgical History:  Procedure Laterality Date   BASAL CELL CARCINOMA EXCISION     "back, face, neck"   BI-VENTRICULAR IMPLANTABLE CARDIOVERTER DEFIBRILLATOR UPGRADE N/A 01/22/2015   MDT CRTD upgrade by Dr Rayann Heman   BIV ICD GENERATOR CHANGEOUT N/A 08/28/2020   Procedure: BIV ICD GENERATOR CHANGEOUT;  Surgeon: [name], MD;  Location: [facility];  Service: Cardiovascular;  Laterality: N/A;   CARDIAC CATHETERIZATION  01/27/2012   normal coronaries, dilated LV c/w nonischemic cardiomyopathy   EXAMINATION UNDER ANESTHESIA  04/06/2012   Procedure: EXAM UNDER ANESTHESIA;  Surgeon: Marcello Moores A. Cornett, MD;  Location: [facility];  Service: General;  Laterality: N/A;   INCISION AND DRAINAGE PERIRECTAL ABSCESS  ~ 2010; 2015   INGUINAL HERNIA REPAIR Left 1980   IR ANGIOGRAM PELVIS SELECTIVE OR SUPRASELECTIVE  10/20/2021   IR ANGIOGRAM VISCERAL SELECTIVE  10/20/2021   IR EMBO ART  VEN HEMORR LYMPH EXTRAV  INC GUIDE ROADMAPPING  10/20/2021   IR US GUIDE VASC ACCESS LEFT  10/20/2021   LEFT HEART  CATHETERIZATION WITH CORONARY ANGIOGRAM N/A 01/27/2012   Procedure: LEFT HEART CATHETERIZATION WITH CORONARY ANGIOGRAM;  Surgeon: Troy Sine, MD;  Location: [facility];  Service: Cardiovascular;  Laterality: N/A;   NM MYOCAR PERF WALL MOTION  01/21/2012   mod-severe perfusion defect due to infarct/scar w/mild perinfarct ischemia in the apical,basal inferoseptal,basal inferior,mid inferoseptal,midinferior and apical inferior regions   pacemaker battery     PERMANENT PACEMAKER INSERTION N/A 12/01/2011   Medtronic implanted by Dr Sallyanne Kuster   PROSTATE BIOPSY     PROSTATE BIOPSY     shrapnel surgery     "in [place]"   TONSILLECTOMY      Family History  Problem Relation Age of Onset   Ovarian cancer Mother    Cancer Mother        ?uterine   Leukemia Father    Heart disease Father    Diabetes Neg Hx     Social History: Married. Per  reports that he quit smoking about 37 years ago. His smoking use included cigarettes. He has a 20.00 pack-year smoking history. He has never used smokeless tobacco. He reports current alcohol use--wine or beer. He reports that he does not use drugs.   Allergies  Allergen Reactions   Sulfa Antibiotics Swelling    Swelling of lips only.   5-Alpha Reductase Inhibitors     Patient and wife state OK for patient to take finasteride - has not taken it before, NKA    Medications Prior to Admission  Medication Sig Dispense Refill   atorvastatin (LIPITOR) 80 MG tablet Take 0.5 tablets (40 mg total) by mouth every evening. 30 tablet 0   buPROPion (WELLBUTRIN XL) 150 MG 24 hr tablet Take 2 tablets in AM, 1 tablet in PM 270 tablet 3   Carboxymethylcellulose Sodium (EYE DROPS OP) Place 1 drop into both eyes 3 (three) times daily. Given by [facility] doctor to help astigmtism     cholecalciferol (VITAMIN D) 25 MCG (1000 UNIT) tablet Take 1 tablet (1,000 Units total) by mouth daily. 30 tablet 0   clobetasol ointment (TEMOVATE) 2.87 % Apply 1 application topically daily as  needed (itching).      donepezil (ARICEPT) 5 MG tablet Take 1 tablet (5 mg total) by mouth daily. 90 tablet 3   [START ON 11/11/2021] finasteride (PROSCAR) 5 MG tablet Take 1 tablet (5 mg total) by mouth daily.     HYDROcodone-acetaminophen (NORCO/VICODIN) 5-325 MG tablet Take 1-2 tablets by mouth every 4 (four) hours as needed for moderate pain. 30 tablet 0   LORazepam (ATIVAN) 0.5 MG tablet Take 1 tablet (0.5 mg total) by mouth every 8 (eight) hours as needed for anxiety. 30 tablet 0   melatonin 10 MG TABS Take 10 mg by mouth at bedtime as needed (insomnia).  0   memantine (NAMENDA) 10 MG tablet Take 1 tablet twice a day (Patient taking differently: Take 10 mg by mouth 2 (two) times daily.) 180 tablet 3   metFORMIN (GLUCOPHAGE) 500 MG tablet Take 0.5 tablets (250 mg total) by mouth 2 (two) times  daily with a meal. (Patient taking differently: Take 500 mg by mouth daily.) 60 tablet 0   oxybutynin (DITROPAN) 5 MG tablet Take 1 tablet (5 mg total) by mouth 2 (two) times daily.     polyethylene glycol (MIRALAX / GLYCOLAX) 17 g packet Take 17 g by mouth 2 (two) times daily. 14 each 0   sodium chloride (OCEAN) 0.65 % SOLN nasal spray Place 1 spray into both nostrils as needed for congestion.  0    Drug Regimen Review  Drug regimen was reviewed and remains appropriate with no significant issues identified  Home: Home Living Family/patient expects to be discharged to:: Private residence Living Arrangements: Spouse/significant other Available Help at Discharge: Family, Available PRN/intermittently Type of Home: House Home Access: Stairs to enter [employer] of Steps: 2 Entrance Stairs-Rails: Can reach both Home Layout: One level Bathroom Shower/Tub: Multimedia programmer: Handicapped height Bathroom Accessibility: Yes Home Equipment: Conservation officer, nature (2 wheels) Additional Comments: Pt is a poor historian, most info taken from chart review.  Lives With: Spouse Olegario Shearer)    Functional History: Prior Function Prior Level of Function : Needs assist Mobility Comments: Using a RW, requiring min guard to min A, pt was in CIR from 12/17-12/27, fell in bathroom on 12/29 ADLs Comments: REquiring assist with LB ADL's   Functional Status:  Mobility: Bed Mobility Overal bed mobility: Needs Assistance Bed Mobility: Supine to Sit Supine to sit: Min guard Sit to supine: Min guard General bed mobility comments: appears laborious, pt over/under estimates how far to scoot. Transfers Overall transfer level: Needs assistance Equipment used: Rolling walker (2 wheels) Transfers: Sit to/from Stand Sit to Stand: Min assist Bed to/from chair/wheelchair/BSC transfer type:: Stand pivot Step pivot transfers: Min assist General transfer comment: cues for hand placement/safety, occasional stability assist. minA from low toilet and initially, as little as min guard with reciprocal transfers from chair Ambulation/Gait Ambulation/Gait assistance: Min assist Gait Distance (Feet): 23 Feet (74ft to toilet, then 19ft back to chair) Assistive device: Rolling walker (2 wheels) Gait Pattern/deviations: Step-through pattern, Decreased step length - right, Decreased step length - left, Decreased stride length, Narrow base of support, Staggering right General Gait Details: narrow BOS and increased postural sway, pt needing up to minA for balance and RW use, pt c/o dizziness so distance limited for BP assessment and + orthostatics Gait velocity: decreased Gait velocity interpretation: 1.31 - 2.62 ft/sec, indicative of limited community ambulator   ADL: ADL Overall ADL's : Needs assistance/impaired Eating/Feeding: Set up, Sitting Grooming: Set up, Sitting Upper Body Bathing: Minimal assistance, Sitting Lower Body Bathing: Sit to/from stand, Sitting/lateral leans, Moderate assistance Upper Body Dressing : Min guard, Sitting Lower Body Dressing: Moderate assistance, Sitting/lateral leans,  Sit to/from stand Lower Body Dressing Details (indicate cue type and reason): able to demo bending to reach top of socks as pt reports this is normally how he gets dressed. will need increased assist in standing to maintain balance/don over waist, as well as sequencing cues Toilet Transfer: Ambulation, Minimal assistance Toileting- Clothing Manipulation and Hygiene: Total assistance (indwelling foley) Functional mobility during ADLs: Minimal assistance, Moderate assistance, Rolling walker (2 wheels) General ADL Comments: Pt requiring max cuing, poor balance, and weakness.   Cognition: Cognition Overall Cognitive Status: Impaired/Different from baseline Orientation Level: Oriented X4 Cognition Arousal/Alertness: Awake/alert Behavior During Therapy: WFL for tasks assessed/performed Overall Cognitive Status: Impaired/Different from baseline Area of Impairment: Memory, Following commands, Safety/judgement, Awareness, Problem solving Orientation Level: Disoriented to, Time, Situation (pt states he was admitted  this time due to falling in the middle of the night when going outside to get the mail; pt acutally fell coing out of the bathroom) Current Attention Level: Sustained Memory: Decreased short-term memory, Decreased recall of precautions Following Commands: Follows one step commands with increased time, Follows multi-step commands inconsistently Safety/Judgement: Decreased awareness of safety, Decreased awareness of deficits Awareness: Emergent Problem Solving: Requires verbal cues General Comments: Pt with decreased insight into safety, given reasoning for safe hand placement with transfers (not to push on RW with both hands) then pt immediately ignoring cues and attempting to unsafely push up on RW.    Blood pressure 132/80, pulse (!) 101, temperature 98.3 F (36.8 C), temperature source Oral, resp. rate 16, height 6\' 4"  (1.93 m), SpO2 97 %. Physical Exam Constitutional:       Appearance: He is ill-appearing.  Cardiology: Tachycardic Pulmonology: Breathing comfortably.  Abdominal:     General: There is no distension.     Tenderness: There is no abdominal tenderness.  Genitourinary:    Comments: Foley in place with red colored urine and small clots in bag Skin:    Comments: Lesion on right scalp (due to agent orange exposure.)  Neurological:     Mental Status: He is easily aroused. He is lethargic.     Comments: Dysarthric speech. Able answer basic orientation questions without difficulty and used calender independently to recall date. Had difficulty performing finger to nose with LUE.  Cranial nerves II through XII intact, 5/5 strength in bilateral upper extremities.  3/5 RIght lower ext prox to 4/5 distal, 5/5 LLE.   Results for orders placed or performed during the hospital encounter of 11/10/21 (from the past 48 hour(s))  Glucose, capillary     Status: Abnormal   Collection Time: 11/10/21  5:35 PM  Result Value Ref Range   Glucose-Capillary 122 (H) 70 - 99 mg/dL    Comment: Glucose reference range applies only to samples taken after fasting for at least 8 hours.   No results found.     Medical Problem List and Plan: 1. Functional deficits secondary to SDH  -patient may shower  -ELOS/Goals: 10-14 days S  Admit to CIR 2.  Antithrombotics: -DVT/anticoagulation:  Mechanical: Sequential compression devices, below knee Bilateral lower extremities  -antiplatelet therapy: Off Plavix due to hematuria.  3. Pain Management: tylenol prn.  4. Mood: LCSW to follow for evaluation and support.   -antipsychotic agents: N/A  5. Neuropsych: This patient maybe intermittently capable of making decisions on his own behalf. 6. Skin/Wound Care: Routine pressure relief measures.  7. Fluids/Electrolytes/Nutrition:  Monitor I/O. Check CMET in am.  8. Prostate cancer/Urinary retention: Monitor for hematuria.   --Abdominal pain and continues to leak urine around tubing.  Patient/wife would like foley out. Will d/c and start I/O caths as hematuria started occurring with indwelling foley not I/O caths.  --On Proscar with ditropan for bladder spasms.  9. T2DM: Hgb A 1C-  Monitor BS ac/hs. Use SSI for elevated BS.   --increase metformin to 500mg  BID 10. Chronic combined systolic/diastolic CHF: Daily weights with low salt diet.  --Monitor for signs of overload.  11. Alzheimer's dementia: Followed by Dr. Delice Lesch. Continue Namenda and Aricept  --Wellbutrin for anger management.   --used ativan prn for anxiety issues.  12. Underweight BMI 17.98: encourage healthy diet.   I have personally performed a face to face diagnostic evaluation, including, but not limited to relevant history and physical exam findings, of this patient and developed relevant assessment  and plan.  Additionally, I have reviewed and concur with the physician assistant's documentation above.  Reesa Chew, [place]  Izora Ribas, MD 11/10/2021

## 2021-11-10 NOTE — Progress Notes (Addendum)
PMR Admission Coordinator Pre-Admission Assessment   Patient: Kevin [name] is an 76 y.o., male MRN: [id number] DOB: [birth year] Height: _0  (193 cm) Weight: 66.9 kg                                                                                                                                                  IInsurance Information HMO:     PPO:      PCP:      IPA:      80/20:      OTHER:  [facility]      Policy#: [id number]      Subscriber: self CM Name: Sherlynn Carbon     Phone#: [phone]     Fax#: [phone] I received a message stating that Pt. Was authorized for admission 11/10/21 until 12/10/21.  Pre-Cert#: [id number]       Employer: Retired Benefits:  Phone #: [phone]     Name: Harrell Lark. Date: 04/02/2018     Deduct: $0      Out of Pocket Max: $0      Life Max: N/A CIR: 100%      SNF: 100% Outpatient: 100%     Co-Pay: none Home Health: 100%      Co-Pay: none DME: 100%     Co-Pay: none Providers: in network  SECONDARY: [place] Medicare HMO      Policy#: [id number]     Phone#: self   Financial Counselor:       Phone#:    The Data Collection Information Summary for patients in Inpatient Rehabilitation Facilities with attached Privacy Act [place] Records was provided and verbally reviewed with: Patient and Family   Emergency Contact Information Contact Information       Name Relation Home Work [place] Spouse [phone]   [phone]    Cox,Cheryl Relative     [phone]         Current Medical History  Patient Admitting Diagnosis: SDH History of Present Illness:HPI: Kevin [name]. Studstill is a 76 year old male with history of NICM, agent orange exposure, LBBB s/p PPM, T2DM, dementia?, prostate cancer, recent admission for fall complicated by pubic fracture and pelvic hematoma s/p coil embolization and urinary retention requiring foley. He was underwent CIR, was discharged to home on 11/03/21 with foley  due to ongoing voiding issues as well as recommendations for CG to min assist. He was admitted on 2/29 after a fall backwards while coming out of the bathroom and gross hematuria noted in foley bag.  He was found to have small anterior falcine SDH which was stable on follow up CT per NS recs. Plavix discontinued and no neurologic changes noted. CT was negative and foley changed out in ED. Case discussed with Dr. Gloriann Loan who recommended addition of finasteride. UA showed hematuria with stable  H/H and leucocytosis has resolved.   Patient with history of dizziness, tinnitus and occasional double vision for years and now with anxiety and increased fear of falling. Therapy initiated and patient noted to have balance deficits with poor posture, cognitive deficits with delay in processing simple one step commands as well as significant cognitive deficits. CIR recommended due to functional decline.    Glasgow Coma Scale Score: 15   Past Medical History      Past Medical History:  Diagnosis Date   Agent orange exposure      in [place]   Anemia     Heart block AV second degree      a. s/p PPM implant with subsequent CRTD upgrade   High cholesterol     History of colon polyps     LBBB (left bundle branch block)     Nonischemic cardiomyopathy ([place])      a. MDT CRTD upgrade 2016   Pacemaker 08/27/2013    Dual-chamber Medtronic Adapta implanted January 2013 for bradycardia with alternating bundle branch block and 2:1 atrioventricular block    Prostate cancer ([place])      "not been treated yet; on a wait and see" (01/22/2015)   Type II diabetes mellitus ([place])        Has the patient had major surgery during 100 days prior to admission? Yes   Family History  family history includes Cancer in his mother; Heart disease in his father; Leukemia in his father; Ovarian cancer in his mother.     Current Medications    Current Facility-Administered Medications:    acetaminophen (TYLENOL) tablet 650 mg, 650 mg,  Oral, Q6H PRN, 650 mg at 11/06/21 1112 **OR** acetaminophen (TYLENOL) suppository 650 mg, 650 mg, Rectal, Q6H PRN, Marcelyn Bruins, MD   atorvastatin (LIPITOR) tablet 40 mg, 40 mg, Oral, QPM, Marcelyn Bruins, MD, 40 mg at 11/05/21 2242   bethanechol (URECHOLINE) tablet 50 mg, 50 mg, Oral, BID, Marcelyn Bruins, MD, 50 mg at 11/06/21 1010   buPROPion (WELLBUTRIN XL) 24 hr tablet 150 mg, 150 mg, Oral, BID, 150 mg at 11/06/21 0936 **AND** buPROPion (WELLBUTRIN XL) 24 hr tablet 150 mg, 150 mg, Oral, q AM, Marcelyn Bruins, MD, 150 mg at 11/06/21 5364   Chlorhexidine Gluconate Cloth 2 % PADS 6 each, 6 each, Topical, Daily, Marcelyn Bruins, MD, 6 each at 11/06/21 0707   donepezil (ARICEPT) tablet 5 mg, 5 mg, Oral, Daily, Marcelyn Bruins, MD, 5 mg at 11/06/21 1009   HYDROcodone-acetaminophen (NORCO/VICODIN) 5-325 MG per tablet 1-2 tablet, 1-2 tablet, Oral, Q4H PRN, Marcelyn Bruins, MD, 2 tablet at 11/06/21 0242   insulin aspart (novoLOG) injection 0-9 Units, 0-9 Units, Subcutaneous, TID WC, Marcelyn Bruins, MD, 1 Units at 11/06/21 0706   memantine [facility]) tablet 10 mg, 10 mg, Oral, BID, Marcelyn Bruins, MD, 10 mg at 11/06/21 1010   polyethylene glycol (MIRALAX / GLYCOLAX) packet 17 g, 17 g, Oral, Daily PRN, Marcelyn Bruins, MD   sodium chloride flush (NS) 0.9 % injection 3 mL, 3 mL, Intravenous, Q12H, Marcelyn Bruins, MD, 3 mL at 11/06/21 1010   tetrahydrozoline 0.05 % ophthalmic solution 1 drop, 1 drop, Both Eyes, TID, Marcelyn Bruins, MD, 1 drop at 11/06/21 1011   Patients Current Diet:  Diet Order                  Diet heart healthy/carb modified Room service appropriate? Yes; Fluid consistency: Thin  Diet effective now                         Precautions / Restrictions Precautions Precautions: Fall Precaution Comments: pacemaker, dizzines/tinnitis Restrictions Weight Bearing Restrictions: No RLE Weight Bearing: Weight bearing as tolerated     Has the patient had 2 or more falls or a fall with injury in the past year?Yes   Prior Activity Level Limited Community (1-2x/wk): went out for appointments   Prior Functional Level Prior Function Prior Level of Function : Needs assist Mobility Comments: Using a RW, requiring min guard to min A, pt was in CIR from 12/17-12/27, fell in bathroom on 12/29 ADLs Comments: REquiring assist with LB ADL's   Self Care: Did the patient need help bathing, dressing, using the toilet or eating?  Needed some help   Indoor Mobility: Did the patient need assistance with walking from room to room (with or without device)? Needed some help   Stairs: Did the patient need assistance with internal or external stairs (with or without device)? Needed some help   Functional Cognition: Did the patient need help planning regular tasks such as shopping or remembering to take medications? Needed some help   Patient Information Are you of Hispanic, Latino/a,or Spanish origin?: A. No, not of Hispanic, Latino/a, or Spanish origin What is your race?: A. White Do you need or want an interpreter to communicate with a doctor or health care staff?: 0. No   Patient's Response To:  Health Literacy and Transportation Is the patient able to respond to health literacy and transportation needs?: Yes Health Literacy - How often do you need to have someone help you when you read instructions, pamphlets, or other written material from your doctor or pharmacy?: Never In the past 12 months, has lack of transportation kept you from medical appointments or from getting medications?: No In the past 12 months, has lack of transportation kept you from meetings, work, or from getting things needed for daily living?: No   Home Assistive Devices / [place] Devices/Equipment: Environmental consultant (specify type) Home Equipment: Rolling Walker (2 wheels)   Prior Device Use: Indicate devices/aids used by the patient prior to  current illness, exacerbation or injury? Walker   Current Functional Level Cognition   Overall Cognitive Status: History of cognitive impairments - at baseline Current Attention Level: Sustained Orientation Level: Oriented X4 Following Commands: Follows one step commands with increased time, Follows multi-step commands inconsistently Safety/Judgement: Decreased awareness of safety, Decreased awareness of deficits General Comments: pt pleasant, oriented to date and aware his balance is very impaired. pt c/o of dizziness which is causing his balance impairement    Extremity Assessment (includes Sensation/Coordination)   Upper Extremity Assessment: Generalized weakness  Lower Extremity Assessment: Generalized weakness     ADLs   Overall ADL's : Needs assistance/impaired Eating/Feeding: Set up, Sitting Grooming: Set up, Sitting Upper Body Bathing: Minimal assistance, Sitting Lower Body Bathing: Maximal assistance, Sit to/from stand, Sitting/lateral leans Upper Body Dressing : Min guard, Sitting Lower Body Dressing: Moderate assistance, Sitting/lateral leans, Sit to/from stand Toilet Transfer: Moderate assistance, Ambulation Toileting- Clothing Manipulation and Hygiene: Moderate assistance, Sitting/lateral lean, Sit to/from stand Functional mobility during ADLs: Minimal assistance, Moderate assistance, Rolling walker (2 wheels) General ADL Comments: Pt requiring max cuing, poor balance, and weakness.     Mobility   Overal bed mobility: Needs Assistance Bed Mobility: Supine to Sit Supine to sit: Min guard Sit to supine: Min guard General bed  mobility comments: HOB elevated, increased time, no directional verbal or tactile cues required     Transfers   Overall transfer level: Needs assistance Equipment used: Rolling walker (2 wheels) Transfers: Sit to/from Stand Sit to Stand: Min assist General transfer comment: Min A to power up from low bed surface, verbal cues for safe hand  placement, modA to control descent into sitting as pt didn't reach back for chair     Ambulation / Gait / Stairs / Wheelchair Mobility   Ambulation/Gait Ambulation/Gait assistance: Herbalist (Feet): 10 Feet Assistive device: Rolling walker (2 wheels) Gait Pattern/deviations: Step-to pattern, Decreased stride length, Shuffle, Trunk flexed General Gait Details: pt with toe in gait pattern with decreased step height and lenght, pt practiced gaze stabilization which decreased the intensity of dizziness during ambulation, pt with increased fear of falling due to dizziness and recent history Gait velocity: decreased Gait velocity interpretation: <1.8 ft/sec, indicate of risk for recurrent falls     Posture / Balance Dynamic Sitting Balance Sitting balance - Comments: pt unable to don socks due to dizziness Balance Overall balance assessment: Needs assistance Sitting-balance support: Feet supported, No upper extremity supported Sitting balance-Leahy Scale: Fair Sitting balance - Comments: pt unable to don socks due to dizziness Standing balance support: Bilateral upper extremity supported, Reliant on assistive device for balance Standing balance-Leahy Scale: Poor Standing balance comment: Reliant on BUE to offload painful RLE in static standing and mobility, physical assist needed for stability     Special needs/care consideration Skin ecchymosis to R and medial hip and thigh and Special service needs none        Previous Home Environment (from acute therapy documentation) Living Arrangements: Spouse/significant other  Lives With: Spouse Olegario Shearer) Available Help at Discharge: Family, Available PRN/intermittently Type of Home: House Home Layout: One level Home Access: Stairs to enter Entrance Stairs-Rails: Can reach both Entrance Stairs-Number of Steps: 2 Bathroom Shower/Tub: Multimedia programmer: Handicapped height Bathroom Accessibility: Yes How Accessible:  Accessible via walker Home Care Services: Yes Type of Home Care Services: Home PT, Home RN [place] (if known): [facility] Additional Comments: Pt is a poor historian, most info taken from chart review.   Discharge Living Setting Plans for Discharge Living Setting: Patient's home Type of Home at Discharge: House Discharge Home Layout: Two level, Bed/bath upstairs, Able to live on main level with bedroom/bathroom Alternate Level Stairs-Number of Steps: Flight Discharge Home Access: Stairs to enter Entrance Stairs-Rails: Right, Left, Can reach both Entrance Stairs-Number of Steps: 4 Discharge Bathroom Shower/Tub: Walk-in shower Discharge Bathroom Toilet: Standard Discharge Bathroom Accessibility: Yes How Accessible: Accessible via walker, Accessible via wheelchair Does the patient have any problems obtaining your medications?: No   Social/Family/Support Systems Patient Roles: Spouse Contact Information: [place] Riel Anticipated Caregiver's Contact Information: [phone] Ability/Limitations of Caregiver: Wife is a Pharmacist, hospital, Can do supervision only Caregiver Availability: Other (Comment) (Wife teaches, so Pt. will be alone during some days) Discharge Plan Discussed with Primary Caregiver: Yes Is Caregiver In Agreement with Plan?: Yes Does Caregiver/Family have Issues with Lodging/Transportation while Pt is in Rehab?: No     Goals Patient/Family Goal for Rehab: PT/OT/SLP Supervision- Mod I Expected length of stay: 7-10 days Pt/Family Agrees to Admission and willing to participate: Yes Program Orientation Provided & Reviewed with Pt/Caregiver Including Roles  & Responsibilities: Yes     Decrease burden of Care through IP rehab admission: Specialzed equipment needs, Decrease number of caregivers, Bowel and bladder program, and Patient/family education  Possible need for SNF placement upon discharge:not anticipated     Patient Condition: I have reviewed medical  records from [facility], spoken with CM, and patient and spouse. I met with patient at the bedside for inpatient rehabilitation assessment.  Patient will benefit from ongoing PT, OT, and SLP, can actively participate in 3 hours of therapy a day 5 days of the week, and can make measurable gains during the admission.  Patient will also benefit from the coordinated team approach during an Inpatient Acute Rehabilitation admission.  The patient will receive intensive therapy as well as Rehabilitation physician, nursing, social worker, and care management interventions.  Due to bladder management, safety, skin/wound care, disease management, medication administration, pain management, and patient education the patient requires 24 hour a day rehabilitation nursing.  The patient is currently min A with mobility and basic ADLs.  Discharge setting and therapy post discharge at home with home health is anticipated.  Patient has agreed to participate in the Acute Inpatient Rehabilitation Program and will admit today.   Preadmission Screen Completed By:  Genella Mech, CCC-SLP, 11/06/2021 12:59 PM ______________________________________________________________________   Discussed status with Dr. Ranell Patrick at 930  and received approval for admission today.   Admission Coordinator:  Genella Mech, time 1330/Date 11/10/21

## 2021-11-10 NOTE — Progress Notes (Signed)
Inpatient Rehab Admissions Coordinator:  ? ?I have a bed for this Pt. On CIR today. RN may call report to [phone]. ? ?Dequita Schleicher, MS, CCC-SLP ?Rehab Admissions Coordinator  ?[phone] (celll) ?[phone] (office) ?

## 2021-11-10 NOTE — Progress Notes (Signed)
Foley discontinued per order at 1830. Patient tolerated well. Bladder scanned for 137 ml. Educated patient about voiding trial. Urinal at bedside.  All questions answered.   Arn Medal LPN

## 2021-11-10 NOTE — TOC Transition Note (Signed)
Transition of Care [facility]) - CM/SW Discharge Note   Patient Details  Name: Kevin [name] MRN: [id number] Date of Birth: 1945/12/04  [facility] CM/SW Contact:  Pollie Friar, RN Phone Number: 11/10/2021, 1:41 PM   Clinical Narrative:    Patient is discharging to CIR today. CM signing off.    Final next level of care: IP Rehab Facility Barriers to Discharge: No Barriers Identified   Patient Goals and CMS Choice     Choice offered to / list presented to : Patient  Discharge Placement                       Discharge Plan and Services                                     Social Determinants of Health (SDOH) Interventions     Readmission Risk Interventions No flowsheet data found.

## 2021-11-10 NOTE — Plan of Care (Signed)

## 2021-11-10 NOTE — H&P (Shared)
Physical Medicine and Rehabilitation Admission H&P    Chief Complaint  Patient presents with   Functional deficits due to fall w/SDH    HPI: Kevin [name] is a 76 year old male with history of NICM, agent orange exposure, LBBB s/p PPM, T2DM, dementia?, prostate cancer, recent admission for fall complicated by pubic fracture and pelvic hematoma s/p coil embolization and urinary retention requiring foley. He was underwent CIR, was discharged to home on 11/03/21 with foley due to ongoing voiding issues as well as recommendations for CG to min assist. He was admitted on . He was 12/29 after a fall backwards while coming out of the bathroom and gross hematuria noted in foley bag.  He was found to have small anterior falcine SDH which was stable on follow up CT per NS recs. Plavix discontinued and no neurologic changes noted.   CT stone was negative for cause of hematuria, ongoing bladder displacement to the left and no change in pelvis sidewall hematoma. Foley changed out in ED. Case discussed with Dr. Gloriann Loan who recommended addition of finasteride. UA showed hematuria with stable H/H and leucocytosis has resolved.  Patient reports history of dizziness, tinnitus and occasional double vision for years and now has anxiety with increased fear of falling. Therapy initiated and patient noted to have balance deficits with poor posture, cognitive deficits with delay in processing simple one step commands as well as significant cognitive deficits. CIR recommended due to functional decline.    Review of Systems  Constitutional:  Negative for malaise/fatigue.  HENT:  Negative for hearing loss and tinnitus.   Eyes:  Positive for double vision (occasionally).  Respiratory:  Negative for cough and shortness of breath.   Cardiovascular:  Negative for chest pain and palpitations.  Gastrointestinal:  Positive for abdominal pain and nausea. Negative for heartburn.  Genitourinary:  Positive for hematuria.  Negative for dysuria.  Musculoskeletal:  Positive for joint pain.  Neurological:  Positive for dizziness (occasionally), weakness and headaches (worse since fall).  Psychiatric/Behavioral:  Positive for depression and memory loss.     Past Medical History:  Diagnosis Date   Agent orange exposure    in [place]   Anemia    Heart block AV second degree    a. s/p PPM implant with subsequent CRTD upgrade   High cholesterol    History of colon polyps    LBBB (left bundle branch block)    Nonischemic cardiomyopathy ([place])    a. MDT CRTD upgrade 2016   Pacemaker 08/27/2013   Dual-chamber Medtronic Adapta implanted January 2013 for bradycardia with alternating bundle branch block and 2:1 atrioventricular block    Prostate cancer ([place])    "not been treated yet; on a wait and see" (01/22/2015)   Type II diabetes mellitus ([place])     Past Surgical History:  Procedure Laterality Date   BASAL CELL CARCINOMA EXCISION     "back, face, neck"   BI-VENTRICULAR IMPLANTABLE CARDIOVERTER DEFIBRILLATOR UPGRADE N/A 01/22/2015   MDT CRTD upgrade by Dr Rayann Heman   BIV ICD GENERATOR CHANGEOUT N/A 08/28/2020   Procedure: BIV ICD GENERATOR CHANGEOUT;  Surgeon: [name], MD;  Location: [facility];  Service: Cardiovascular;  Laterality: N/A;   CARDIAC CATHETERIZATION  01/27/2012   normal coronaries, dilated LV c/w nonischemic cardiomyopathy   EXAMINATION UNDER ANESTHESIA  04/06/2012   Procedure: EXAM UNDER ANESTHESIA;  Surgeon: Marcello Moores A. Cornett, MD;  Location: [facility];  Service: General;  Laterality: N/A;   INCISION AND  DRAINAGE PERIRECTAL ABSCESS  ~ 2010; 2015   INGUINAL HERNIA REPAIR Left 1980   IR ANGIOGRAM PELVIS SELECTIVE OR SUPRASELECTIVE  10/20/2021   IR ANGIOGRAM VISCERAL SELECTIVE  10/20/2021   IR EMBO ART  VEN HEMORR LYMPH EXTRAV  INC GUIDE ROADMAPPING  10/20/2021   IR US GUIDE VASC ACCESS LEFT  10/20/2021   LEFT HEART CATHETERIZATION WITH CORONARY ANGIOGRAM N/A 01/27/2012   Procedure: LEFT  HEART CATHETERIZATION WITH CORONARY ANGIOGRAM;  Surgeon: Troy Sine, MD;  Location: [facility];  Service: Cardiovascular;  Laterality: N/A;   NM MYOCAR PERF WALL MOTION  01/21/2012   mod-severe perfusion defect due to infarct/scar w/mild perinfarct ischemia in the apical,basal inferoseptal,basal inferior,mid inferoseptal,midinferior and apical inferior regions   pacemaker battery     PERMANENT PACEMAKER INSERTION N/A 12/01/2011   Medtronic implanted by Dr Sallyanne Kuster   PROSTATE BIOPSY     PROSTATE BIOPSY     shrapnel surgery     "in [place]"   TONSILLECTOMY      Family History  Problem Relation Age of Onset   Ovarian cancer Mother    Cancer Mother        ?uterine   Leukemia Father    Heart disease Father    Diabetes Neg Hx     Social History: Married. Per  reports that he quit smoking about 37 years ago. His smoking use included cigarettes. He has a 20.00 pack-year smoking history. He has never used smokeless tobacco. He reports current alcohol use--wine or beer. He reports that he does not use drugs.   Allergies  Allergen Reactions   Sulfa Antibiotics Swelling    Swelling of lips only.   5-Alpha Reductase Inhibitors     Patient and wife state OK for patient to take finasteride - has not taken it before, NKA    Medications Prior to Admission  Medication Sig Dispense Refill   atorvastatin (LIPITOR) 80 MG tablet Take 0.5 tablets (40 mg total) by mouth every evening. 30 tablet 0   bethanechol (URECHOLINE) 50 MG tablet Take 50 mg by mouth 2 (two) times daily.     buPROPion (WELLBUTRIN XL) 150 MG 24 hr tablet Take 2 tablets in AM, 1 tablet in PM 270 tablet 3   clobetasol ointment (TEMOVATE) 7.35 % Apply 1 application topically daily as needed (itching).      clopidogrel (PLAVIX) 75 MG tablet Take 1 tablet (75 mg total) by mouth daily. 30 tablet 0   donepezil (ARICEPT) 5 MG tablet Take 1 tablet (5 mg total) by mouth daily. 90 tablet 3   HYDROcodone-acetaminophen (NORCO/VICODIN)  5-325 MG tablet Take 1-2 tablets by mouth every 4 (four) hours as needed for moderate pain. 30 tablet 0   LORazepam (ATIVAN) 0.5 MG tablet Take 1 tablet (0.5 mg total) by mouth every 8 (eight) hours as needed for anxiety. 30 tablet 0   memantine (NAMENDA) 10 MG tablet Take 1 tablet twice a day (Patient taking differently: Take 10 mg by mouth 2 (two) times daily.) 180 tablet 3   metFORMIN (GLUCOPHAGE) 500 MG tablet Take 0.5 tablets (250 mg total) by mouth 2 (two) times daily with a meal. (Patient taking differently: Take 500 mg by mouth daily.) 60 tablet 0   sodium chloride (OCEAN) 0.65 % SOLN nasal spray Place 1 spray into both nostrils as needed for congestion.  0   acetaminophen (TYLENOL) 325 MG tablet Take 2 tablets (650 mg total) by mouth every 6 (six) hours as needed for mild pain (or  Fever >/= 101). (Patient not taking: Reported on 11/05/2021)     Carboxymethylcellulose Sodium (EYE DROPS OP) Place 1 drop into both eyes 3 (three) times daily. Given by [facility] doctor to help astigmtism     cholecalciferol (VITAMIN D) 25 MCG (1000 UNIT) tablet Take 1 tablet (1,000 Units total) by mouth daily. 30 tablet 0   loratadine (CLARITIN) 10 MG tablet Take 1 tablet (10 mg total) by mouth daily.     polyethylene glycol (MIRALAX / GLYCOLAX) 17 g packet Take 17 g by mouth 2 (two) times daily. 14 each 0    Drug Regimen Review { DRUG REGIMEN [id number]}  Home: Home Living Family/patient expects to be discharged to:: Private residence Living Arrangements: Spouse/significant other Available Help at Discharge: Family, Available PRN/intermittently Type of Home: House Home Access: Stairs to enter [employer] of Steps: 2 Entrance Stairs-Rails: Can reach both Home Layout: One level Bathroom Shower/Tub: Multimedia programmer: Handicapped height Bathroom Accessibility: Yes Home Equipment: Conservation officer, nature (2 wheels) Additional Comments: Pt is a poor historian, most info taken from chart  review.  Lives With: Spouse Olegario Shearer)   Functional History: Prior Function Prior Level of Function : Needs assist Mobility Comments: Using a RW, requiring min guard to min A, pt was in CIR from 12/17-12/27, fell in bathroom on 12/29 ADLs Comments: REquiring assist with LB ADL's  Functional Status:  Mobility: Bed Mobility Overal bed mobility: Needs Assistance Bed Mobility: Supine to Sit Supine to sit: Min guard Sit to supine: Min guard General bed mobility comments: appears laborious, pt over/under estimates how far to scoot. Transfers Overall transfer level: Needs assistance Equipment used: Rolling walker (2 wheels) Transfers: Sit to/from Stand Sit to Stand: Min assist Bed to/from chair/wheelchair/BSC transfer type:: Stand pivot Step pivot transfers: Min assist General transfer comment: cues for hand placement/safety, occasional stability assist. minA from low toilet and initially, as little as min guard with reciprocal transfers from chair Ambulation/Gait Ambulation/Gait assistance: Min assist Gait Distance (Feet): 23 Feet (12ft to toilet, then 24ft back to chair) Assistive device: Rolling walker (2 wheels) Gait Pattern/deviations: Step-through pattern, Decreased step length - right, Decreased step length - left, Decreased stride length, Narrow base of support, Staggering right General Gait Details: narrow BOS and increased postural sway, pt needing up to minA for balance and RW use, pt c/o dizziness so distance limited for BP assessment and + orthostatics Gait velocity: decreased Gait velocity interpretation: 1.31 - 2.62 ft/sec, indicative of limited community ambulator    ADL: ADL Overall ADL's : Needs assistance/impaired Eating/Feeding: Set up, Sitting Grooming: Set up, Sitting Upper Body Bathing: Minimal assistance, Sitting Lower Body Bathing: Sit to/from stand, Sitting/lateral leans, Moderate assistance Upper Body Dressing : Min guard, Sitting Lower Body Dressing:  Moderate assistance, Sitting/lateral leans, Sit to/from stand Lower Body Dressing Details (indicate cue type and reason): able to demo bending to reach top of socks as pt reports this is normally how he gets dressed. will need increased assist in standing to maintain balance/don over waist, as well as sequencing cues Toilet Transfer: Ambulation, Minimal assistance Toileting- Clothing Manipulation and Hygiene: Total assistance (indwelling foley) Functional mobility during ADLs: Minimal assistance, Moderate assistance, Rolling walker (2 wheels) General ADL Comments: Pt requiring max cuing, poor balance, and weakness.  Cognition: Cognition Overall Cognitive Status: Impaired/Different from baseline Orientation Level: Oriented X4 Cognition Arousal/Alertness: Awake/alert Behavior During Therapy: WFL for tasks assessed/performed Overall Cognitive Status: Impaired/Different from baseline Area of Impairment: Memory, Following commands, Safety/judgement, Awareness, Problem solving Orientation Level: Disoriented  to, Time, Situation (pt states he was admitted this time due to falling in the middle of the night when going outside to get the mail; pt acutally fell coing out of the bathroom) Current Attention Level: Sustained Memory: Decreased short-term memory, Decreased recall of precautions Following Commands: Follows one step commands with increased time, Follows multi-step commands inconsistently Safety/Judgement: Decreased awareness of safety, Decreased awareness of deficits Awareness: Emergent Problem Solving: Requires verbal cues General Comments: Pt with decreased insight into safety, given reasoning for safe hand placement with transfers (not to push on RW with both hands) then pt immediately ignoring cues and attempting to unsafely push up on RW.   Blood pressure 113/75, pulse (!) 102, temperature 98.3 F (36.8 C), temperature source Oral, resp. rate 18, height 6\' 4"  (1.93 m), weight 67 kg,  SpO2 96 %. Physical Exam Constitutional:      Appearance: He is ill-appearing.  Abdominal:     General: There is no distension.     Tenderness: There is no abdominal tenderness.  Genitourinary:    Comments: Foley in place with red colored urine and small clots in bag Skin:    Comments: Lesion on right scalp (due to agent orange exposure.)  Neurological:     Mental Status: He is easily aroused. He is lethargic.     Comments: Dysarthric speech. Able answer basic orientation questions without difficulty and used calender independently to recall date. Had difficulty performing finger to nose with LUE.     Results for orders placed or performed during the hospital encounter of 11/05/21 (from the past 48 hour(s))  Glucose, capillary     Status: Abnormal   Collection Time: 11/08/21  4:26 PM  Result Value Ref Range   Glucose-Capillary 170 (H) 70 - 99 mg/dL    Comment: Glucose reference range applies only to samples taken after fasting for at least 8 hours.  Glucose, capillary     Status: Abnormal   Collection Time: 11/08/21  9:34 PM  Result Value Ref Range   Glucose-Capillary 138 (H) 70 - 99 mg/dL    Comment: Glucose reference range applies only to samples taken after fasting for at least 8 hours.  CBC with Differential/Platelet     Status: Abnormal   Collection Time: 11/09/21  2:09 AM  Result Value Ref Range   WBC 8.0 4.0 - 10.5 K/uL   RBC 3.98 (L) 4.22 - 5.81 MIL/uL   Hemoglobin 13.5 13.0 - 17.0 g/dL   HCT 41.2 39.0 - 52.0 %   MCV 103.5 (H) 80.0 - 100.0 fL   MCH 33.9 26.0 - 34.0 pg   MCHC 32.8 30.0 - 36.0 g/dL   RDW 13.3 11.5 - 15.5 %   Platelets 213 150 - 400 K/uL   nRBC 0.0 0.0 - 0.2 %   Neutrophils Relative % 79 %   Neutro Abs 6.2 1.7 - 7.7 K/uL   Lymphocytes Relative 12 %   Lymphs Abs 0.9 0.7 - 4.0 K/uL   Monocytes Relative 8 %   Monocytes Absolute 0.7 0.1 - 1.0 K/uL   Eosinophils Relative 1 %   Eosinophils Absolute 0.1 0.0 - 0.5 K/uL   Basophils Relative 0 %    Basophils Absolute 0.0 0.0 - 0.1 K/uL   Immature Granulocytes 0 %   Abs Immature Granulocytes 0.03 0.00 - 0.07 K/uL    Comment: Performed at [facility], [address], [place], [place] [address]  Basic metabolic panel     Status: Abnormal  Collection Time: 11/09/21  2:09 AM  Result Value Ref Range   Sodium 138 135 - 145 mmol/L   Potassium 4.0 3.5 - 5.1 mmol/L   Chloride 105 98 - 111 mmol/L   CO2 28 22 - 32 mmol/L   Glucose, Bld 177 (H) 70 - 99 mg/dL    Comment: Glucose reference range applies only to samples taken after fasting for at least 8 hours.   BUN 19 8 - 23 mg/dL   Creatinine, Ser 1.22 0.61 - 1.24 mg/dL   Calcium 8.9 8.9 - 10.3 mg/dL   GFR, Estimated >60 >60 mL/min    Comment: (NOTE) Calculated using the CKD-EPI Creatinine Equation (2021)    Anion gap 5 5 - 15    Comment: Performed at [facility] [address] [place], [place] [address]  Glucose, capillary     Status: Abnormal   Collection Time: 11/09/21  6:14 AM  Result Value Ref Range   Glucose-Capillary 137 (H) 70 - 99 mg/dL    Comment: Glucose reference range applies only to samples taken after fasting for at least 8 hours.  Glucose, capillary     Status: Abnormal   Collection Time: 11/09/21 11:54 AM  Result Value Ref Range   Glucose-Capillary 173 (H) 70 - 99 mg/dL    Comment: Glucose reference range applies only to samples taken after fasting for at least 8 hours.   Comment 1 Notify RN    Comment 2 Document in Chart   Glucose, capillary     Status: Abnormal   Collection Time: 11/09/21  4:38 PM  Result Value Ref Range   Glucose-Capillary 125 (H) 70 - 99 mg/dL    Comment: Glucose reference range applies only to samples taken after fasting for at least 8 hours.   Comment 1 Notify RN    Comment 2 Document in Chart   Glucose, capillary     Status: Abnormal   Collection Time: 11/09/21  9:13 PM  Result Value Ref Range   Glucose-Capillary 129 (H) 70 - 99 mg/dL    Comment: Glucose reference range  applies only to samples taken after fasting for at least 8 hours.  Glucose, capillary     Status: Abnormal   Collection Time: 11/10/21  7:07 AM  Result Value Ref Range   Glucose-Capillary 195 (H) 70 - 99 mg/dL    Comment: Glucose reference range applies only to samples taken after fasting for at least 8 hours.  Glucose, capillary     Status: Abnormal   Collection Time: 11/10/21  9:02 AM  Result Value Ref Range   Glucose-Capillary 192 (H) 70 - 99 mg/dL    Comment: Glucose reference range applies only to samples taken after fasting for at least 8 hours.  Glucose, capillary     Status: Abnormal   Collection Time: 11/10/21 11:26 AM  Result Value Ref Range   Glucose-Capillary 154 (H) 70 - 99 mg/dL    Comment: Glucose reference range applies only to samples taken after fasting for at least 8 hours.   No results found.     Medical Problem List and Plan: 1. Functional deficits secondary to ***  -patient may *** shower  -ELOS/Goals: *** 2.  Antithrombotics: -DVT/anticoagulation:  Mechanical: Sequential compression devices, below knee Bilateral lower extremities  -antiplatelet therapy: Off Plavix due to hematuria.  3. Pain Management: tylenol prn.  4. Mood: LCSW to follow for evaluation and support.   -antipsychotic agents: N/A  5. Neuropsych: This patient maybe intermittently capable  of making decisions on his own behalf. 6. Skin/Wound Care: Routine pressure relief measures.  7. Fluids/Electrolytes/Nutrition:  Monitor I/O. Check CMET in am.  8. Prostate cancer/Urinary retention: Monitor for hematuria.   --Abdominal pain and continues to leak urine around tubing. Patient/wife would like foley out. Will d/c and start I/O caths as hematuria started occurring with indwelling foley not I/O caths.  --On Proscar with ditropan for bladder spasms.  9. T2DM: Hgb A 1C-  Monitor BS ac/hs. Use SSI for elevated BS.   --continue metformin  10. Chronic combined systolic/diastolic CHF: Daily weights  with low salt diet.  --Monitor for signs of overload.  11. Alzheimer's dementia: Followed by Dr. Delice Lesch. On Namenda and Aricept  --Wellbutrin for anger management.   --used ativan prn for anxiety issues.      ***  Bary Leriche, PA-C 11/10/2021

## 2021-11-10 NOTE — Progress Notes (Signed)
Inpatient Rehab Admissions Coordinator:   I continue to await insurance auth for this Pt. I will continue to follow for potential admit pending insurance auth.  Clemens Catholic, [place], [place] Admissions Coordinator  [phone] ([place]) [phone] (office)

## 2021-11-10 NOTE — Progress Notes (Signed)
PROGRESS NOTE  Kevin [name]  DOB: Jul 16, 1946  PCP: [name], MD QBH:[id number]  DOA: 11/05/2021  LOS: 0 days  Hospital Day: 6  Chief Complaint  Patient presents with   Fall    Brief narrative: Kevin [name] is a 76 y.o. male with PMH significant for DM2, HLD, nonischemic cardiomyopathy, second-degree heart block s/p PPM, prostate cancer, chronic anemia, anxiety. Patient was brought to the ED from home for recurrent fall. Patient was recently hospitalized 12/13-12/17 for mechanical fall leading to pelvic hematoma for which he underwent coil embolization of pelvic arteries.  He also had hematuria and urinary retention thought to be related to pelvic hematoma and had a Foley catheter inserted.  Per discharge summary, hematuria resolved and patient passed a voiding trial prior to discharge to CIR on 12/17.  He stayed at [facility] for 10 days.   As previously planned, while in CIR, his Plavix was resumed on 12/23.  Per family, on the day of discharge from [facility], he had urinary retention again, Foley catheter was inserted and he was discharged home with that.  At home, he started having hematuria again.  On 12/29, patient had another fall at home.  He was attempting to back out of the bathroom lost his balance and fell backwards.  He did hit his head on the ground, did not lose consciousness.   In the ED, vital signs stable CT of the head showed a small subdural hematoma on the right without any significant mass-effect nor midline shift.   CT of the C-spine showed no acute abnormality.   Chest x-ray showed no acute normality.   Pelvis x-ray showed stable rami fractures.   L-spine x-ray showed degenerative disc disease but no acute abnormalities.  Labs with BUN/creatinine 25/1.26 Urinalysis was very turbid because of large amount of blood.  It also showed rare bacteria and calcium oxalate crystals.    In the ED he was noted to have leaking of blood from the meatus and hence  underwent Foley catheter exchange with good drainage. Because of the presence of subdural hematoma, neurosurgery was consulted.  They recommended to observe overnight and repeat CT scan in 6 to 8 hours.  Subjective: Patient was seen and examined this morning.   Sitting up in chair.  Not in distress.  No new symptoms.  Foley catheter with mild hematuria present.  Assessment/Plan: Subdural hematoma -Secondary to a fall  -CT head with small right-sided subdural hematoma without mass-effect or midline shift. -As recommended, repeat CT scan of head was obtained in 6 to 8 hours.  It showed stable hematoma without any mass-effect or new areas of hemorrhage or new infarct. -Plavix remains on hold because of hematuria.  Once hematuria resolves, he may benefit from switching from Plavix to baby aspirin.  He has no history of ischemic stroke or stenting anywhere. -Mental status remains intact.  Hematuria History of prostate cancer -Patient has a history of prostate cancer and follows up with urology at [facility].   -Prior to last hospitalization, patient did not have hematuria or Foley catheter in place.  He had hematuria and urine retention in last hospitalization which recurred again in CIR and was discharged with a Foley catheter in place.  -He has an appointment an outpatient at [facility] for voiding trial.  As previously planned, Plavix was resumed on 12/23. -Presented again with hematuria this admission.  Catheter exchanged in the ED with good urine output. -12/30, CT hematuria work-up was obtained which did not show  any source of hematuria.  Stable known pelvic hematoma. -Plavix is on hold.  Hemoglobin stable.   -Patient however continues to have mild hematuria.  He is also having some leakage from the meatus. -1/3, I discussed with urology Dr. Tresa Moore.  We reviewed the history, exam findings and imagings.  He stated that patient may have had radiation injury from prostate cancer treatment because of which, he  may continue to have low-grade hematuria and leakage from the meatus which could be worse with bladder spasm.  He suggested to start on oxybutynin, stop urecholine and okay to continue finasteride.   Recent Labs    10/20/21 1939 10/21/21 0156 10/22/21 0359 10/24/21 0053 10/26/21 0601 11/05/21 1620 11/06/21 0622 11/07/21 0132 11/08/21 0137 11/09/21 0209  HGB 11.4* 10.6* 10.4* 11.4* 11.7* 14.0 13.9 13.4 13.5 13.5   Recent pelvic hematoma -recently hospitalized 12/13-12/17 for mechanical fall leading to pelvic hematoma for which he underwent coil embolization of pelvic arteries.   Type 2 diabetes mellitus -A1c 6.7 on 10/22/2021 -Home meds include metformin 500 mg daily -Blood sugars running elevated.  Resumed metformin today.  Continue sliding scale insulin. Recent Labs  Lab 11/09/21 2113 11/10/21 0707 11/10/21 0902 11/10/21 1126 11/10/21 1308  GLUCAP 129* 195* 192* 154* 128*   Chronic stable systolic CHF -Echo from 75/1025 with EF 40-45% and grade 1 diastolic dysfunction with mildly reduced RV function.  Not currently on any diuretics. -Continue to monitor fluid status and renal function  Second-degree AV block -s/p PPM  CKD 3a -Stable creatinine Recent Labs    10/21/21 0156 10/22/21 0359 10/24/21 0053 10/26/21 0601 10/31/21 0720 11/05/21 1620 11/06/21 0622 11/07/21 0132 11/08/21 0137 11/09/21 0209  BUN 14 12 13 20 21  25* 20 16 17 19   CREATININE 1.25* 1.39* 1.13 1.09 1.18 1.26* 1.18 1.07 1.16 1.22   Anxiety Mild dementia - Continue home bupropion, Aricept, Namenda  Mobility: Needs repeat PT eval Living condition: Was discharged to home from CIR 2 days prior to presentation Goals of care:   Code Status: Full Code  Nutritional status: Body mass index is 17.98 kg/m.      Diet:  Diet Order             Diet heart healthy/carb modified Room service appropriate? Yes; Fluid consistency: Thin  Diet effective now                  DVT prophylaxis:   SCDs Start: 11/05/21 1854   Antimicrobials: None Fluid: None Consultants: Neurosurgery Family Communication: Family not at bedside today  Status is: Observation  Continue in-hospital care because: Pending CIR  level of care: Telemetry Medical   Dispo: The patient is from: Home              Anticipated d/c is to: May need placement              Patient currently is medically stable to d/c.   Difficult to place patient No     Infusions:    Scheduled Meds:  atorvastatin  40 mg Oral QPM   buPROPion  150 mg Oral BID   And   buPROPion  150 mg Oral q AM   Chlorhexidine Gluconate Cloth  6 each Topical Daily   donepezil  5 mg Oral Daily   finasteride  5 mg Oral Daily   insulin aspart  0-9 Units Subcutaneous TID WC   memantine  10 mg Oral BID   metFORMIN  500 mg Oral Q breakfast  oxybutynin  5 mg Oral BID   sodium chloride flush  3 mL Intravenous Q12H   tetrahydrozoline  1 drop Both Eyes TID    PRN meds: acetaminophen **OR** acetaminophen, HYDROcodone-acetaminophen, melatonin, polyethylene glycol   Antimicrobials: Anti-infectives (From admission, onward)    None       Objective: Vitals:   11/10/21 0727 11/10/21 1126  BP: 119/66 113/75  Pulse: 99 (!) 102  Resp: 18 18  Temp: 98.3 F (36.8 C)   SpO2: 97% 96%    Intake/Output Summary (Last 24 hours) at 11/10/2021 1310 Last data filed at 11/10/2021 0347 Gross per 24 hour  Intake --  Output 350 ml  Net -350 ml   Filed Weights   11/05/21 1458 11/08/21 0453 11/09/21 0400  Weight: 66.9 kg 67.3 kg 67 kg   Weight change:  Body mass index is 17.98 kg/m.   Physical Exam: General exam: Pleasant, elderly Caucasian male.  Not in physical distress.  Urobag with mild blood-tinged urine skin: No rashes, lesions or ulcers. HEENT: Atraumatic, normocephalic, no obvious bleeding Lungs: Clear to auscultation bilaterally CVS: Regular rate and rhythm, no murmur GI/Abd soft, mild lower abdominal discomfort on palpation,  nondistended, bowel sound present CNS: Alert, awake, oriented x3 with some difficulty.   Psychiatry: Mood appropriate Extremities: No pedal edema, no calf tenderness  Data Review: I have personally reviewed the laboratory data and studies available.  F/u labs ordered Unresulted Labs (From admission, onward)     Start     Ordered   11/05/21 1524  Urine Culture  ONCE - STAT,   STAT       Question:  Indication  Answer:  Acute gross hematuria   11/05/21 1524            Signed, Terrilee Croak, MD [facility] 11/10/2021

## 2021-11-10 NOTE — Discharge Summary (Signed)
Physician Discharge Summary  KOI Kevin [name]:[id number] DOB: Aug 13, 1946 DOA: 11/05/2021  PCP: Kevin Post, MD  Admit date: 11/05/2021 Discharge date: 11/10/2021  Admitted From: Home Discharge disposition: SNF   Code Status: Full Code   Discharge Diagnosis:   Principal Problem:   Subdural hematoma Active Problems:   Anxiety   Hypertension   DM2 (diabetes mellitus, type 2) ([place])   Hyperlipidemia   Sick sinus syndrome (Kevin [name])   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 in situ   Fall    Chief Complaint  Patient presents with   Fall    Brief narrative: Kevin [name] is a 76 y.o. male with PMH significant for DM2, HLD, nonischemic cardiomyopathy, second-degree heart block s/p PPM, prostate cancer, chronic anemia, anxiety. Patient was brought to the ED from home for recurrent fall. Patient was recently hospitalized 12/13-12/17 for mechanical fall leading to pelvic hematoma for which he underwent coil embolization of pelvic arteries.  He also had hematuria and urinary retention thought to be related to pelvic hematoma and had a Foley catheter inserted.  Per discharge summary, hematuria resolved and patient passed a voiding trial prior to discharge to CIR on 12/17.  He stayed at [facility] for 10 days.   As previously planned, while in CIR, his Plavix was resumed on 12/23.  Per family, on the day of discharge from [place], he had urinary retention again, Foley catheter was inserted and he was discharged home with that.  At home, he started having hematuria again.  On 12/29, patient had another fall at home.  He was attempting to back out of the bathroom lost his balance and fell backwards.  He did hit his head on the ground, did not lose consciousness.   In the ED, vital signs stable CT of the head showed a small subdural hematoma on the right without any significant mass-effect nor midline shift.   CT of the C-spine showed no acute abnormality.   Chest  x-ray showed no acute normality.   Pelvis x-ray showed stable rami fractures.   L-spine x-ray showed degenerative disc disease but no acute abnormalities.  Labs with BUN/creatinine 25/1.26 Urinalysis was very turbid because of large amount of blood.  It also showed rare bacteria and calcium oxalate crystals.    In the ED he was noted to have leaking of blood from the meatus and hence underwent Foley catheter exchange with good drainage. Because of the presence of subdural hematoma, neurosurgery was consulted.  They recommended to observe overnight and repeat CT scan in 6 to 8 hours.  Subjective: Patient was seen and examined this morning.   Sitting up in chair.  Not in distress.  No new symptoms.  Foley catheter with mild hematuria present.  Assessment/Plan: Subdural hematoma -Secondary to a fall  -CT head with small right-sided subdural hematoma without mass-effect or midline shift. -As recommended, repeat CT scan of head was obtained in 6 to 8 hours.  It showed stable hematoma without any mass-effect or new areas of hemorrhage or new infarct. -Plavix remains on hold because of hematuria.  Once hematuria resolves, he may benefit from switching from Plavix to baby aspirin.  He has no history of ischemic stroke or stenting anywhere. -Mental status remains intact.  Hematuria History of prostate cancer -Patient has a history of prostate cancer and follows up with urology at [facility].   -Prior to last hospitalization, patient did not have hematuria or Foley catheter in place.  He had hematuria and urine retention in  last hospitalization which recurred again in CIR and was discharged with a Foley catheter in place.  -He has an appointment an outpatient at [facility] for voiding trial.  As previously planned, Plavix was resumed on 12/23. -Presented again with hematuria this admission.  Catheter exchanged in the ED with good urine output. -12/30, CT hematuria work-up was obtained which did not show any source  of hematuria.  Stable known pelvic hematoma. -Plavix is on hold.  Hemoglobin stable.   -Patient however continues to have mild hematuria.  He is also having some leakage from the meatus. -1/3, I discussed with urology Dr. Tresa [name].  We reviewed the history, exam findings and imagings.  He stated that patient may have had radiation injury from prostate cancer treatment because of which, he may continue to have low-grade hematuria and leakage from the meatus which could be worse with bladder spasm.  He suggested to start on oxybutynin, stop urecholine and okay to continue finasteride.   Recent Labs    10/20/21 1939 10/21/21 0156 10/22/21 0359 10/24/21 0053 10/26/21 0601 11/05/21 1620 11/06/21 0622 11/07/21 0132 11/08/21 0137 11/09/21 0209  HGB 11.4* 10.6* 10.4* 11.4* 11.7* 14.0 13.9 13.4 13.5 13.5   Recent pelvic hematoma -recently hospitalized 12/13-12/17 for mechanical fall leading to pelvic hematoma for which he underwent coil embolization of pelvic arteries.   Type 2 diabetes mellitus -A1c 6.7 on 10/22/2021 -Home meds include metformin 500 mg daily -Blood sugars running elevated.  Resumed metformin today.  Continue sliding scale insulin. Recent Labs  Lab 11/09/21 2113 11/10/21 0707 11/10/21 0902 11/10/21 1126 11/10/21 1308  GLUCAP 129* 195* 192* 154* 128*   Chronic stable systolic CHF -Echo from 15/1761 with EF 40-45% and grade 1 diastolic dysfunction with mildly reduced RV function.  Not currently on any diuretics. -Continue to monitor fluid status and renal function  Second-degree AV block -s/p PPM  CKD 3a -Stable creatinine Recent Labs    10/21/21 0156 10/22/21 0359 10/24/21 0053 10/26/21 0601 10/31/21 0720 11/05/21 1620 11/06/21 0622 11/07/21 0132 11/08/21 0137 11/09/21 0209  BUN 14 12 13 20 21  25* 20 16 17 19   CREATININE 1.25* 1.39* 1.13 1.09 1.18 1.26* 1.18 1.07 1.16 1.22   Anxiety Mild dementia - Continue home bupropion, Aricept, Namenda  Mobility:  Needs repeat PT eval Living condition: Was discharged to home from CIR 2 days prior to presentation Goals of care:   Code Status: Full Code  Nutritional status: Body mass index is 17.98 kg/m.      Discharge Medications:   Allergies as of 11/10/2021       Reactions   Sulfa Antibiotics Swelling   Swelling of lips only.   5-alpha Reductase Inhibitors    Patient and wife state OK for patient to take finasteride - has not taken it before, NKA        Medication List     STOP taking these medications    acetaminophen 325 MG tablet Commonly known as: TYLENOL   bethanechol 50 MG tablet Commonly known as: URECHOLINE   clopidogrel 75 MG tablet Commonly known as: PLAVIX   loratadine 10 MG tablet Commonly known as: CLARITIN       TAKE these medications    atorvastatin 80 MG tablet Commonly known as: LIPITOR Take 0.5 tablets (40 mg total) by mouth every evening.   buPROPion 150 MG 24 hr tablet Commonly known as: WELLBUTRIN XL Take 2 tablets in AM, 1 tablet in PM   cholecalciferol 25 MCG (1000 UNIT) tablet Commonly known  as: VITAMIN D Take 1 tablet (1,000 Units total) by mouth daily.   clobetasol ointment 0.05 % Commonly known as: TEMOVATE Apply 1 application topically daily as needed (itching).   donepezil 5 MG tablet Commonly known as: ARICEPT Take 1 tablet (5 mg total) by mouth daily.   EYE DROPS OP Place 1 drop into both eyes 3 (three) times daily. Given by [facility] doctor to help astigmtism   finasteride 5 MG tablet Commonly known as: PROSCAR Take 1 tablet (5 mg total) by mouth daily. Start taking on: November 11, 2021   HYDROcodone-acetaminophen 5-325 MG tablet Commonly known as: NORCO/VICODIN Take 1-2 tablets by mouth every 4 (four) hours as needed for moderate pain.   LORazepam 0.5 MG tablet Commonly known as: ATIVAN Take 1 tablet (0.5 mg total) by mouth every 8 (eight) hours as needed for anxiety.   Melatonin 10 MG Tabs Take 10 mg by mouth at bedtime as  needed (insomnia).   memantine 10 MG tablet Commonly known as: NAMENDA Take 1 tablet twice a day What changed:  how much to take how to take this when to take this additional instructions   metFORMIN 500 MG tablet Commonly known as: GLUCOPHAGE Take 0.5 tablets (250 mg total) by mouth 2 (two) times daily with a meal. What changed:  how much to take when to take this   oxybutynin 5 MG tablet Commonly known as: DITROPAN Take 1 tablet (5 mg total) by mouth 2 (two) times daily.   polyethylene glycol 17 g packet Commonly known as: MIRALAX / GLYCOLAX Take 17 g by mouth 2 (two) times daily.   sodium chloride 0.65 % Soln nasal spray Commonly known as: OCEAN Place 1 spray into both nostrils as needed for congestion.        Wound care:     Discharge Instructions:   Discharge Instructions     Call MD for:  difficulty breathing, headache or visual disturbances   Complete by: As directed    Call MD for:  extreme fatigue   Complete by: As directed    Call MD for:  hives   Complete by: As directed    Call MD for:  persistant dizziness or light-headedness   Complete by: As directed    Call MD for:  persistant nausea and vomiting   Complete by: As directed    Call MD for:  severe uncontrolled pain   Complete by: As directed    Call MD for:  temperature >100.4   Complete by: As directed    Diet general   Complete by: As directed    Discharge instructions   Complete by: As directed    General discharge instructions:  Follow with Primary MD Kevin Post, MD in 7 days   Get CBC/BMP checked in next visit within 1 week by PCP or SNF MD. (We routinely change or add medications that can affect your baseline labs and fluid status, therefore we recommend that you get the mentioned basic workup next visit with your PCP, your PCP may decide not to get them or add new tests based on their clinical decision)  On your next visit with your PCP, please get your medicines reviewed  and adjusted.  Please request your PCP  to go over all hospital tests, procedures, radiology results at the follow up, please get all Hospital records sent to your PCP by signing hospital release before you go home.  Activity: As tolerated with Full fall precautions use walker/cane & assistance as  needed  Avoid using any recreational substances like cigarette, tobacco, alcohol, or non-prescribed drug.  If you experience worsening of your admission symptoms, develop shortness of breath, life threatening emergency, suicidal or homicidal thoughts you must seek medical attention immediately by calling 911 or calling your MD immediately  if symptoms less severe.  You must read complete instructions/literature along with all the possible adverse reactions/side effects for all the medicines you take and that have been prescribed to you. Take any new medicine only after you have completely understood and accepted all the possible adverse reactions/side effects.   Do not drive, operate heavy machinery, perform activities at heights, swimming or participation in water activities or provide baby sitting services if your were admitted for syncope or siezures until you have seen by Primary MD or a Neurologist and advised to do so again.  Do not drive when taking Pain medications.  Do not take more than prescribed Pain, Sleep and Anxiety Medications  Wear Seat belts while driving.  Please note You were cared for by a hospitalist during your hospital stay. If you have any questions about your discharge medications or the care you received while you were in the hospital after you are discharged, you can call the unit and asked to speak with the hospitalist on call if the hospitalist that took care of you is not available. Once you are discharged, your primary care physician will handle any further medical issues. Please note that NO REFILLS for any discharge medications will be authorized once you are discharged,  as it is imperative that you return to your primary care physician (or establish a relationship with a primary care physician if you do not have one) for your aftercare needs so that they can reassess your need for medications and monitor your lab values.   Increase activity slowly   Complete by: As directed        Follow ups:    Follow-up Information     Burchette, Alinda Sierras, MD Follow up.   Specialty: Family Medicine Contact information: [address] [place] [address] [phone]                 Discharge Exam:   Vitals:   11/09/21 2329 11/10/21 0350 11/10/21 0727 11/10/21 1126  BP: 130/82 117/74 119/66 113/75  Pulse: 92 90 99 (!) 102  Resp: 18 18 18 18   Temp: 98.5 F (36.9 C) 98.2 F (36.8 C) 98.3 F (36.8 C)   TempSrc: Oral Oral Oral   SpO2: 98% 96% 97% 96%  Weight:      Height:        Body mass index is 17.98 kg/m.  General exam: Pleasant, elderly Caucasian male.  Not in physical distress.  Urobag with mild blood-tinged urine skin: No rashes, lesions or ulcers. HEENT: Atraumatic, normocephalic, no obvious bleeding Lungs: Clear to auscultation bilaterally CVS: Regular rate and rhythm, no murmur GI/Abd soft, mild lower abdominal discomfort on palpation, nondistended, bowel sound present CNS: Alert, awake, oriented x3 with some difficulty.   Psychiatry: Mood appropriate Extremities: No pedal edema, no calf tenderness  Time coordinating discharge: 35 minutes   The results of significant diagnostics from this hospitalization (including imaging, microbiology, ancillary and laboratory) are listed below for reference.    Procedures and Diagnostic Studies:   DG Chest 1 View  Result Date: 11/05/2021 CLINICAL DATA:  Shortness of breath. Low back pain and right hip pain. EXAM: CHEST  1 VIEW COMPARISON:  10/20/2021 FINDINGS: AICD noted.  The lungs appear clear. Cardiac and mediastinal contours normal. No pleural effusion identified. Mild thoracic  spondylosis. IMPRESSION: 1.  No active cardiopulmonary disease is radiographically apparent. 2. Mild thoracic spondylosis. Electronically Signed   By: Van Clines M.D.   On: 11/05/2021 16:17   DG Lumbar Spine Complete  Result Date: 11/05/2021 CLINICAL DATA:  Low back pain and right hip pain after a fall. EXAM: LUMBAR SPINE - COMPLETE 4+ VIEW COMPARISON:  CT 10/20/2021 FINDINGS: Five lumbar type vertebral bodies. Diffuse bone demineralization. Normal alignment. No vertebral compression deformities. No focal bone lesion or bone destruction. Degenerative changes throughout the lumbar spine with narrowed interspaces and endplate hypertrophic changes. Degenerative changes in the facet joints. Visualized sacrum appears intact. IMPRESSION: Degenerative changes in the lumbar spine. Normal alignment. No acute displaced fractures identified. Electronically Signed   By: Lucienne Capers M.D.   On: 11/05/2021 16:17   DG Pelvis 1-2 Views  Result Date: 11/05/2021 CLINICAL DATA:  Low back pain and right hip pain. EXAM: PELVIS - 1-2 VIEW COMPARISON:  CT 10/20/2021 FINDINGS: Comminuted fractures demonstrated in the right superior and inferior pubic rami as seen on prior CT. Degenerative changes in the hips and lower lumbar spine. SI joints and symphysis pubis are not displaced. Visualized sacrum appears intact. IMPRESSION: Fractures of the right superior and inferior pubic rami are again demonstrated without significant change. Degenerative changes in the hips and lower lumbar spine. Electronically Signed   By: Lucienne Capers M.D.   On: 11/05/2021 16:15   CT HEAD WO CONTRAST  Result Date: 11/06/2021 CLINICAL DATA:  Subdural hematoma EXAM: CT HEAD WITHOUT CONTRAST TECHNIQUE: Contiguous axial images were obtained from the base of the skull through the vertex without intravenous contrast. COMPARISON:  11/05/2021 FINDINGS: Brain: Anterior falcine subdural hematoma again noted, measuring up to 2.7 x 1.0 x 0.8 cm  unchanged. No new areas of hemorrhage. No mass effect. No signs of acute infarct. Lateral ventricles and remaining midline structures are unremarkable. Vascular: No hyperdense vessel or unexpected calcification. Skull: Normal. Negative for fracture or focal lesion. Sinuses/Orbits: No acute finding. Other: None. IMPRESSION: 1. Stable small anterior falcine subdural hematoma without significant mass effect. No new areas of hemorrhage. 2. No acute infarct. Electronically Signed   By: Randa Ngo M.D.   On: 11/06/2021 03:00   CT HEAD WO CONTRAST (5MM)  Result Date: 11/05/2021 CLINICAL DATA:  Head trauma, moderate to severe, fall EXAM: CT HEAD WITHOUT CONTRAST CT CERVICAL SPINE WITHOUT CONTRAST TECHNIQUE: Multidetector CT imaging of the head and cervical spine was performed following the standard protocol without intravenous contrast. Multiplanar CT image reconstructions of the cervical spine were also generated. COMPARISON:  10/20/2021 CT head and cervical spine FINDINGS: CT HEAD FINDINGS Brain: Hyperdensity along the right aspect of the anterior falx, measuring up to 2.8 x 0.8 x 1.2 cm (AP x TR x CC) (series 6, image 29; series 5, image 31; series 100, image 21), likely a small subdural hemorrhage. No other acute extra-axial collection. No acute infarct, intraparenchymal hemorrhage, mass, mass effect, or midline shift. Unchanged size and configuration of the ventricles. Vascular: No hyperdense vessel. Skull: No acute fracture or suspicious osseous lesion. Sinuses/Orbits: No acute finding. Status [name] bilateral lens replacements. Other: The mastoids are well aerated. CT CERVICAL SPINE FINDINGS Alignment: No acute listhesis. Skull base and vertebrae: No acute fracture. No primary bone lesion or focal pathologic process. Soft tissues and spinal canal: No prevertebral fluid or swelling. No visible canal hematoma. Disc levels: Multilevel degenerative changes,  with fusion of C5 and C6, where there is likely mild  spinal canal stenosis. Multilevel facet and uncovertebral degenerative changes, which causes severe neural foraminal narrowing on the right at C4-C5. Upper chest: Redemonstrated apical pleural-parenchymal scarring. Other: None. IMPRESSION: 1. Small subdural hemorrhage along the right aspect of the anterior falx, without significant mass effect or midline shift. 2. No other acute intracranial process. 3.  No acute fracture or traumatic listhesis in the cervical spine. These results were called by telephone at the time of interpretation on 11/05/2021 at 4:35 pm to provider [facility] , who verbally acknowledged these results. Electronically Signed   By: Merilyn Baba M.D.   On: 11/05/2021 16:36   CT CERVICAL SPINE WO CONTRAST  Result Date: 11/05/2021 CLINICAL DATA:  Head trauma, moderate to severe, fall EXAM: CT HEAD WITHOUT CONTRAST CT CERVICAL SPINE WITHOUT CONTRAST TECHNIQUE: Multidetector CT imaging of the head and cervical spine was performed following the standard protocol without intravenous contrast. Multiplanar CT image reconstructions of the cervical spine were also generated. COMPARISON:  10/20/2021 CT head and cervical spine FINDINGS: CT HEAD FINDINGS Brain: Hyperdensity along the right aspect of the anterior falx, measuring up to 2.8 x 0.8 x 1.2 cm (AP x TR x CC) (series 6, image 29; series 5, image 31; series 100, image 21), likely a small subdural hemorrhage. No other acute extra-axial collection. No acute infarct, intraparenchymal hemorrhage, mass, mass effect, or midline shift. Unchanged size and configuration of the ventricles. Vascular: No hyperdense vessel. Skull: No acute fracture or suspicious osseous lesion. Sinuses/Orbits: No acute finding. Status [name] bilateral lens replacements. Other: The mastoids are well aerated. CT CERVICAL SPINE FINDINGS Alignment: No acute listhesis. Skull base and vertebrae: No acute fracture. No primary bone lesion or focal pathologic process. Soft  tissues and spinal canal: No prevertebral fluid or swelling. No visible canal hematoma. Disc levels: Multilevel degenerative changes, with fusion of C5 and C6, where there is likely mild spinal canal stenosis. Multilevel facet and uncovertebral degenerative changes, which causes severe neural foraminal narrowing on the right at C4-C5. Upper chest: Redemonstrated apical pleural-parenchymal scarring. Other: None. IMPRESSION: 1. Small subdural hemorrhage along the right aspect of the anterior falx, without significant mass effect or midline shift. 2. No other acute intracranial process. 3.  No acute fracture or traumatic listhesis in the cervical spine. These results were called by telephone at the time of interpretation on 11/05/2021 at 4:35 pm to provider [facility] , who verbally acknowledged these results. Electronically Signed   By: Merilyn Baba M.D.   On: 11/05/2021 16:36   CT HEMATURIA WORKUP  Result Date: 11/06/2021 CLINICAL DATA:  Gross hematuria. EXAM: CT ABDOMEN AND PELVIS WITHOUT AND WITH CONTRAST TECHNIQUE: Multidetector CT imaging of the abdomen and pelvis was performed following the standard protocol before and following the bolus administration of intravenous contrast. CONTRAST:  122mL OMNIPAQUE IOHEXOL 300 MG/ML  SOLN COMPARISON:  Abdominal CTA 10/20/2021. FINDINGS: Lower chest: Clear lung bases. There is no significant pleural or pericardial effusion. Biventricular pacemaker noted. Hepatobiliary: The liver is normal in density without suspicious focal abnormality. The gallbladder is mildly distended without wall thickening or surrounding inflammation. There is a small dependent calcified gallstone. No evidence of biliary dilatation. Pancreas: Unremarkable. No pancreatic ductal dilatation or surrounding inflammatory changes. Spleen: Normal in size without focal abnormality. Adrenals/Urinary Tract: Both adrenal glands appear normal. Pre-contrast images demonstrate no renal, ureteral or  bladder calculi. [name]-contrast, both kidneys enhance normally. There is no evidence of enhancing renal mass.  Stable renal cysts, measuring up to 1.8 cm posteriorly in the upper pole of the left kidney. Delayed images result in segmental visualization of the ureters. No focal upper tract urothelial abnormalities are identified. Foley catheter remains in place. There is air in the bladder lumen. The bladder remains displaced to the left by the previously demonstrated right pelvic sidewall hematoma. There is nonspecific bladder wall thickening without apparent focal abnormality. Stomach/Bowel: No enteric contrast administered. The stomach appears unremarkable for its degree of distention. No evidence of bowel wall thickening, distention or surrounding inflammation. The appendix appears normal. Prominent stool throughout the colon. Vascular/Lymphatic: There are no enlarged abdominal or pelvic lymph nodes. Mild aortic and branch vessel atherosclerosis. Reproductive: Mild enlargement of the prostate gland. Other: Large bilobed right pelvic sidewall hematoma again noted, similar in size to previous CTA. There is an anterior component measuring 7.7 x 6.4 cm on image 74/8. A posterior component measures up to 4.4 cm. No definite residual active bleeding identified on the current study status [name] embolization procedure. As above, there is persistent mass effect on the bladder which is displaced to the left. No new or enlarging collections are identified. No ascites or free air. Musculoskeletal: Stable mildly displaced fractures of the right pubic rami and right sacral ala. No diastasis of the sacroiliac joints. There are degenerative and postsurgical changes in the lower lumbar spine. IMPRESSION: 1. No clear cause for hematuria identified. There is no evidence of urinary tract calculus, hydronephrosis, renal mass or focal urothelial lesion. 2. The bladder remains displaced to the left by the patient's known right pelvic  sidewall hematoma which is similar in size to the prior CT scan. No active bleeding into this hematoma is seen status [name] interval embolization procedure. Nonspecific bladder wall thickening. Foley catheter in place. 3. Mildly distended gallbladder without gallbladder wall thickening. Small gallstone. 4. Stable fractures of the right pubic rami and sacral ala. Electronically Signed   By: Richardean Sale M.D.   On: 11/06/2021 16:33     Labs:   Basic Metabolic Panel: Recent Labs  Lab 11/05/21 1620 11/06/21 0622 11/07/21 0132 11/08/21 0137 11/09/21 0209  NA 137 137 137 139 138  K 4.3 4.2 4.0 4.0 4.0  CL 102 104 103 105 105  CO2 27 27 26 28 28   GLUCOSE 110* 133* 131* 129* 177*  BUN 25* 20 16 17 19   CREATININE 1.26* 1.18 1.07 1.16 1.22  CALCIUM 9.0 9.0 8.9 9.0 8.9   GFR Estimated Creatinine Clearance: 49.6 mL/min (by C-G formula based on SCr of 1.22 mg/dL). Liver Function Tests: Recent Labs  Lab 11/05/21 1620 11/06/21 0622  AST 19 20  ALT 11 9  ALKPHOS 227* 238*  BILITOT 1.4* 1.3*  PROT 6.8 6.7  ALBUMIN 3.3* 3.3*   No results for input(s): LIPASE, AMYLASE in the last 168 hours. No results for input(s): AMMONIA in the last 168 hours. Coagulation profile No results for input(s): INR, PROTIME in the last 168 hours.  CBC: Recent Labs  Lab 11/05/21 1620 11/06/21 0622 11/07/21 0132 11/08/21 0137 11/09/21 0209  WBC 12.0* 10.0 10.1 8.6 8.0  NEUTROABS 10.1*  --  8.0* 6.6 6.2  HGB 14.0 13.9 13.4 13.5 13.5  HCT 43.3 41.5 40.2 40.4 41.2  MCV 103.8* 101.5* 102.0* 102.0* 103.5*  PLT 266 265 246 227 213   Cardiac Enzymes: No results for input(s): CKTOTAL, CKMB, CKMBINDEX, TROPONINI in the last 168 hours. BNP: Invalid input(s): POCBNP CBG: Recent Labs  Lab 11/09/21 2113 11/10/21 0712  11/10/21 0902 11/10/21 1126 11/10/21 1308  GLUCAP 129* 195* 192* 154* 128*   D-Dimer No results for input(s): DDIMER in the last 72 hours. Hgb A1c No results for input(s): HGBA1C in  the last 72 hours. Lipid Profile No results for input(s): CHOL, HDL, LDLCALC, TRIG, CHOLHDL, LDLDIRECT in the last 72 hours. Thyroid function studies No results for input(s): TSH, T4TOTAL, T3FREE, THYROIDAB in the last 72 hours.  Invalid input(s): FREET3 Anemia work up No results for input(s): VITAMINB12, FOLATE, FERRITIN, TIBC, IRON, RETICCTPCT in the last 72 hours. Microbiology Recent Results (from the past 240 hour(s))  Resp Panel by RT-PCR (Flu A&B, Covid) Nasopharyngeal Swab     Status: None   Collection Time: 11/05/21  6:27 PM   Specimen: Nasopharyngeal Swab; Nasopharyngeal(NP) swabs in vial transport medium  Result Value Ref Range Status   SARS Coronavirus 2 by RT PCR NEGATIVE NEGATIVE Final    Comment: (NOTE) SARS-CoV-2 target nucleic acids are NOT DETECTED.  The SARS-CoV-2 RNA is generally detectable in upper respiratory specimens during the acute phase of infection. The lowest concentration of SARS-CoV-2 viral copies this assay can detect is 138 copies/mL. A negative result does not preclude SARS-Cov-2 infection and should not be used as the sole basis for treatment or other patient management decisions. A negative result may occur with  improper specimen collection/handling, submission of specimen other than nasopharyngeal swab, presence of viral mutation(s) within the areas targeted by this assay, and inadequate number of viral copies(<138 copies/mL). A negative result must be combined with clinical observations, patient history, and epidemiological information. The expected result is Negative.  Fact Sheet for Patients:  EntrepreneurPulse.com.au  Fact Sheet for Healthcare Providers:  IncredibleEmployment.be  This test is no t yet approved or cleared by the [place] FDA and  has been authorized for detection and/or diagnosis of SARS-CoV-2 by FDA under an Emergency Use Authorization (EUA). This EUA will remain  in effect  (meaning this test can be used) for the duration of the COVID-19 declaration under Section 564(b)(1) of the Act, 21 U.S.C.section 360bbb-3(b)(1), unless the authorization is terminated  or revoked sooner.       Influenza A by PCR NEGATIVE NEGATIVE Final   Influenza B by PCR NEGATIVE NEGATIVE Final    Comment: (NOTE) The Xpert Xpress SARS-CoV-2/FLU/RSV plus assay is intended as an aid in the diagnosis of influenza from Nasopharyngeal swab specimens and should not be used as a sole basis for treatment. Nasal washings and aspirates are unacceptable for Xpert Xpress SARS-CoV-2/FLU/RSV testing.  Fact Sheet for Patients: EntrepreneurPulse.com.au  Fact Sheet for Healthcare Providers: IncredibleEmployment.be  This test is not yet approved or cleared by the [place] FDA and has been authorized for detection and/or diagnosis of SARS-CoV-2 by FDA under an Emergency Use Authorization (EUA). This EUA will remain in effect (meaning this test can be used) for the duration of the COVID-19 declaration under Section 564(b)(1) of the Act, 21 U.S.C. section 360bbb-3(b)(1), unless the authorization is terminated or revoked.  Performed at [facility], [place] [address], [place], [place] [address]      Signed: Terrilee Croak  [facility] 11/10/2021, 1:40 PM

## 2021-11-10 NOTE — Progress Notes (Signed)
Physical Therapy Treatment Patient Details Name: Kevin [name] MRN: [id number] DOB: [birth year] Today's Date: 11/10/2021   History of Present Illness 76 y.o. M admited on 12/29 due to a fall where he hit his head, CT shows SDH. Recently hospitalized and in AIR for fall with pubic rami fracture and pelvis hematoma. PMH: anemia, anal fissure, anxiety, CHF, sick sinus syndrome and heart block status post pacemaker and AICD, diabetes, hyperlipidemia, prostate cancer.    PT Comments    Pt received in supine, agreeable to therapy session and with good participation and fair tolerance for seated/standing exercises and transfer training. Pt limited due to symptomatic orthostatic hypotension (see vitals in comments section below) and unsafe to progress gait this session. Pt may benefit from trial of BLE compression stockings and/or abdominal binder in future sessions to see if this helps with hemodynamic stability. Of note, pt indwelling catheter leaking while pt standing and needed bed sheets changed, pt with posterior lean with transfers/standing and variable min/modA for safety with standing tasks. Pt continues to benefit from PT services to progress toward functional mobility goals.    Recommendations for follow up therapy are one component of a multi-disciplinary discharge planning process, led by the attending physician.  Recommendations may be updated based on patient status, additional functional criteria and insurance authorization.  Follow Up Recommendations  Acute inpatient rehab (3hours/day)     Assistance Recommended at Discharge Frequent or constant Supervision/Assistance  Patient can return home with the following     Equipment Recommendations  Rolling walker (2 wheels)    Recommendations for Other Services Rehab consult     Precautions / Restrictions Precautions Precautions: Fall Precaution Comments: pacemaker, dizzines/tinnitis, indwelling catheter leaks Restrictions Weight  Bearing Restrictions: Yes RLE Weight Bearing: Weight bearing as tolerated     Mobility  Bed Mobility Overal bed mobility: Needs Assistance Bed Mobility: Supine to Sit     Supine to sit: Min guard Sit to supine: Min guard   General bed mobility comments: appears laborious, heavy reliance on bed rail and HOB elevated    Transfers Overall transfer level: Needs assistance Equipment used: Rolling walker (2 wheels) Transfers: Sit to/from Stand Sit to Stand: Min assist     Step pivot transfers: Mod assist     General transfer comment: Cues for hand placement/safety, occasional stability assist. Pt forgets safety cues needs frequent reminders    Ambulation/Gait                   Stairs             Wheelchair Mobility    Modified Rankin (Stroke Patients Only) Modified Rankin (Stroke Patients Only) Pre-Morbid Rankin Score: Moderately severe disability Modified Rankin: Moderately severe disability     Balance Overall balance assessment: Needs assistance;History of Falls Sitting-balance support: No upper extremity supported;Single extremity supported Sitting balance-Leahy Scale: Fair Sitting balance - Comments: fair static sitting   Standing balance support: Bilateral upper extremity supported;Reliant on assistive device for balance Standing balance-Leahy Scale: Poor Standing balance comment: Posterior lean when standing, needing min/modA for dynamic standing tasks                            Cognition Arousal/Alertness: Awake/alert Behavior During Therapy: WFL for tasks assessed/performed Overall Cognitive Status: Impaired/Different from baseline Area of Impairment: Memory;Following commands;Safety/judgement;Awareness;Problem solving                   Current Attention Level: Sustained  Memory: Decreased short-term memory;Decreased recall of precautions Following Commands: Follows one step commands with increased time;Follows  multi-step commands inconsistently Safety/Judgement: Decreased awareness of safety;Decreased awareness of deficits Awareness: Emergent Problem Solving: Requires verbal cues General Comments: Pt with decreased insight into safety, given reasoning for safe hand placement with transfers (not to push on RW with both hands due to pt posterior lean) then pt immediately ignoring cues and attempting to unsafely push up on RW.        Exercises Other Exercises Other Exercises: STS x 5 reps x 2 sets (10 total) from EOB Other Exercises: standing hip flexion x10 reps Other Exercises: seated BLE AROM: hip flexion, LAQ, ankle pumps x10 reps ea    General Comments General comments (skin integrity, edema, etc.): sitting BP 118/73 (83); HR 98 bpm; BP standing 96/82 (87) HR to 115; sitting BP 121/72 (88); HR 101 bpm;      Pertinent Vitals/Pain Pain Assessment: No/denies pain Pain Intervention(s): Limited activity within patient's tolerance;Monitored during session;Repositioned    Home Living                          Prior Function            PT Goals (current goals can now be found in the care plan section) Acute Rehab PT Goals Patient Stated Goal: to get better and less dizziness PT Goal Formulation: With patient Time For Goal Achievement: 11/20/21 Progress towards PT goals: Progressing toward goals    Frequency    Min 5X/week      PT Plan Current plan remains appropriate    Co-evaluation              AM-PAC PT "6 Clicks" Mobility   Outcome Measure  Help needed turning from your back to your side while in a flat bed without using bedrails?: A Little Help needed moving from lying on your back to sitting on the side of a flat bed without using bedrails?: A Little Help needed moving to and from a bed to a chair (including a wheelchair)?: A Lot (modA) Help needed standing up from a chair using your arms (e.g., wheelchair or bedside chair)?: A Lot (mod cues for  safety/technique) Help needed to walk in hospital room?: A Lot (mod cues for safety) Help needed climbing 3-5 steps with a railing? : Total 6 Click Score: 13    End of Session Equipment Utilized During Treatment: Gait belt Activity Tolerance: Patient tolerated treatment well;Treatment limited secondary to medical complications (Comment) (orthostatic hypotension limiting) Patient left: in bed;with call bell/phone within reach;with bed alarm set;Other (comment);with family/visitor present (bed in chair posture) Nurse Communication: Mobility status;Other (comment);Precautions (sx orthostatic hypotension) PT Visit Diagnosis: Unsteadiness on feet (R26.81);Other abnormalities of gait and mobility (R26.89);Difficulty in walking, not elsewhere classified (R26.2)     Time: 9381-0175 PT Time Calculation (min) (ACUTE ONLY): 35 min  Charges:  $Therapeutic Exercise: 8-22 mins $Therapeutic Activity: 8-22 mins                     Rainna Nearhood P., PTA Acute Rehabilitation Services Pager: [phone] Office: [place] 11/10/2021, 3:19 PM

## 2021-11-10 NOTE — Progress Notes (Signed)
Attending requested RN call to inquire about patient's coude foley catheter dt leaking around the catheter during urination. 6N called, Charge RN Rod Holler said Urology would need to be consulted, Updated attending MD, no new orders at this time.

## 2021-11-11 DIAGNOSIS — S065XAA Traumatic subdural hemorrhage with loss of consciousness status unknown, initial encounter: Secondary | ICD-10-CM | POA: Diagnosis not present

## 2021-11-11 LAB — CBC WITH DIFFERENTIAL/PLATELET
Abs Immature Granulocytes: 0.02 10*3/uL (ref 0.00–0.07)
Basophils Absolute: 0 10*3/uL (ref 0.0–0.1)
Basophils Relative: 0 %
Eosinophils Absolute: 0.1 10*3/uL (ref 0.0–0.5)
Eosinophils Relative: 1 %
HCT: 41.8 % (ref 39.0–52.0)
Hemoglobin: 13.6 g/dL (ref 13.0–17.0)
Immature Granulocytes: 0 %
Lymphocytes Relative: 12 %
Lymphs Abs: 1.1 10*3/uL (ref 0.7–4.0)
MCH: 33.5 pg (ref 26.0–34.0)
MCHC: 32.5 g/dL (ref 30.0–36.0)
MCV: 103 fL — ABNORMAL HIGH (ref 80.0–100.0)
Monocytes Absolute: 0.7 10*3/uL (ref 0.1–1.0)
Monocytes Relative: 8 %
Neutro Abs: 6.9 10*3/uL (ref 1.7–7.7)
Neutrophils Relative %: 79 %
Platelets: 208 10*3/uL (ref 150–400)
RBC: 4.06 MIL/uL — ABNORMAL LOW (ref 4.22–5.81)
RDW: 13.3 % (ref 11.5–15.5)
WBC: 8.7 10*3/uL (ref 4.0–10.5)
nRBC: 0 % (ref 0.0–0.2)

## 2021-11-11 LAB — GLUCOSE, CAPILLARY
Glucose-Capillary: 129 mg/dL — ABNORMAL HIGH (ref 70–99)
Glucose-Capillary: 123 mg/dL — ABNORMAL HIGH (ref 70–99)
Glucose-Capillary: 112 mg/dL — ABNORMAL HIGH (ref 70–99)

## 2021-11-11 LAB — COMPREHENSIVE METABOLIC PANEL
ALT: 11 U/L (ref 0–44)
AST: 18 U/L (ref 15–41)
Albumin: 3.1 g/dL — ABNORMAL LOW (ref 3.5–5.0)
Alkaline Phosphatase: 271 U/L — ABNORMAL HIGH (ref 38–126)
Anion gap: 7 (ref 5–15)
BUN: 21 mg/dL (ref 8–23)
CO2: 28 mmol/L (ref 22–32)
Calcium: 9.1 mg/dL (ref 8.9–10.3)
Chloride: 104 mmol/L (ref 98–111)
Creatinine, Ser: 1.24 mg/dL (ref 0.61–1.24)
GFR, Estimated: >60 mL/min (ref >60)
Glucose, Bld: 140 mg/dL — ABNORMAL HIGH (ref 70–99)
Potassium: 4.5 mmol/L (ref 3.5–5.1)
Sodium: 139 mmol/L (ref 135–145)
Total Bilirubin: 1.2 mg/dL (ref 0.3–1.2)
Total Protein: 6.4 g/dL — ABNORMAL LOW (ref 6.5–8.1)

## 2021-11-11 MED ORDER — SENNA 8.6 MG PO TABS
1.0000 | ORAL_TABLET | Freq: Every day | ORAL | Status: DC
  Administered 2021-11-11 – 2021-11-12 (×2): 8.6 mg via ORAL
  Filled 2021-11-11 (×2): qty 1

## 2021-11-11 MED ORDER — TRAZODONE HCL 50 MG PO TABS
50.0000 mg | ORAL_TABLET | Freq: Every day | ORAL | Status: DC
  Administered 2021-11-11 – 2021-11-19 (×9): 50 mg via ORAL
  Filled 2021-11-11 (×9): qty 1

## 2021-11-11 NOTE — Evaluation (Signed)
Speech Language Pathology Assessment and Plan  Patient Details  Name: Kevin [name] MRN: [id number] Date of Birth: 04-06-1946  SLP Diagnosis: Cognitive Impairments  Rehab Potential: Good ELOS: 2 weeks    Today's Date: 11/11/2021 SLP Individual Time: 0805-0900 SLP Individual Time Calculation (min): 55 min   Hospital Problem: Principal Problem:   SDH (subdural hematoma)  Past Medical History:  Past Medical History:  Diagnosis Date   Agent orange exposure    in [place]   Anemia    Dementia ([place])    Heart block AV second degree    a. s/p PPM implant with subsequent CRTD upgrade   High cholesterol    History of colon polyps    LBBB (left bundle branch block)    Nonischemic cardiomyopathy ([place])    a. MDT CRTD upgrade 2016   Pacemaker 08/27/2013   Dual-chamber Medtronic Adapta implanted January 2013 for bradycardia with alternating bundle branch block and 2:1 atrioventricular block    Prostate cancer ([place])    "not been treated yet; on a wait and see" (01/22/2015)   Type II diabetes mellitus ([place])    Past Surgical History:  Past Surgical History:  Procedure Laterality Date   BASAL CELL CARCINOMA EXCISION     "back, face, neck"   BI-VENTRICULAR IMPLANTABLE CARDIOVERTER DEFIBRILLATOR UPGRADE N/A 01/22/2015   MDT CRTD upgrade by Dr Rayann Heman   BIV ICD GENERATOR CHANGEOUT N/A 08/28/2020   Procedure: BIV ICD GENERATOR CHANGEOUT;  Surgeon: [name], MD;  Location: [facility];  Service: Cardiovascular;  Laterality: N/A;   CARDIAC CATHETERIZATION  01/27/2012   normal coronaries, dilated LV c/w nonischemic cardiomyopathy   EXAMINATION UNDER ANESTHESIA  04/06/2012   Procedure: EXAM UNDER ANESTHESIA;  Surgeon: Marcello Moores A. Cornett, MD;  Location: [facility];  Service: General;  Laterality: N/A;   INCISION AND DRAINAGE PERIRECTAL ABSCESS  ~ 2010; 2015   INGUINAL HERNIA REPAIR Left 1980   IR ANGIOGRAM PELVIS SELECTIVE OR SUPRASELECTIVE  10/20/2021   IR ANGIOGRAM VISCERAL SELECTIVE   10/20/2021   IR EMBO ART  VEN HEMORR LYMPH EXTRAV  INC GUIDE ROADMAPPING  10/20/2021   IR US GUIDE VASC ACCESS LEFT  10/20/2021   LEFT HEART CATHETERIZATION WITH CORONARY ANGIOGRAM N/A 01/27/2012   Procedure: LEFT HEART CATHETERIZATION WITH CORONARY ANGIOGRAM;  Surgeon: Troy Sine, MD;  Location: [facility];  Service: Cardiovascular;  Laterality: N/A;   NM MYOCAR PERF WALL MOTION  01/21/2012   mod-severe perfusion defect due to infarct/scar w/mild perinfarct ischemia in the apical,basal inferoseptal,basal inferior,mid inferoseptal,midinferior and apical inferior regions   pacemaker battery     PERMANENT PACEMAKER INSERTION N/A 12/01/2011   Medtronic implanted by Dr Sallyanne Kuster   PROSTATE BIOPSY     PROSTATE BIOPSY     shrapnel surgery     "in [place]"   [place] / Plan / Recommendation  Kevin Tugwell. Gordillo is a 76 year old [name] with history of NICM, agent orange exposure, LBBB s/p PPM, T2DM, dementia?, prostate cancer, recent admission for fall complicated by pubic fracture and pelvic hematoma s/p coil embolization and urinary retention requiring foley. He underwent CIR, was discharged to home on 11/03/21. He was admitted on 12/29 after a fall backwards while coming out of the bathroom and gross hematuria noted in foley bag.  He was found to have small anterior falcine SDH which was stable on follow up CT per NS recs. Plavix discontinued and no neurologic changes noted.  Patient reports history  of dizziness, tinnitus and occasional double vision for years and now has anxiety with increased fear of falling. Therapy initiated and patient noted to have balance deficits with poor posture, cognitive deficits with delay in processing simple one step commands as well as significant cognitive deficits. CIR recommended due to functional decline. Wife is looking into hiring help at home.   Clinical Impression  Patient presents with a mild-moderate cognitive impairment as per this  evaluation. He does have a premorbid diagnosis of dementia. He participated in [employer] Mental status exam) testing and received a score of 17 which places him the category of 'Dementia' (scores between 1-20). He was oriented to year, month, place and basic situation. He was not able to recall any of the 5 words on test after approximately 2 minute delay. He did demonstrate some awareness to his errors, such as during divergent naming task, he got stuck, saying "Kevin [name] my mind is not working". Later on when drawing clock, he commented that the numbers weren't correct. (all there but not all spaced appropriately). He did not recall being here in [place] before, though he was discharged 12/27 from CIR following a fall. Patient will benefit from skilled SLP intervention to maximize cognitive function and complete education with family for safety upon discharge.  Skilled Therapeutic Interventions          SLUMS, SLE  SLP Assessment  Patient will need skilled [facility] during CIR admission    Recommendations  Patient destination: Home Follow up Recommendations: None;24 hour supervision/assistance Equipment Recommended: None recommended by SLP    SLP Frequency 3 to 5 out of 7 days   SLP Duration  SLP Intensity  SLP Treatment/Interventions 2 weeks  Minumum of 1-2 x/day, 30 to 90 minutes  Cognitive remediation/compensation;Internal/external aids;Patient/family education;Functional tasks;Environmental controls    Pain Pain Assessment Pain Scale: 0-10 Pain Score: 0-No pain Pain Type: Acute pain Pain Location: Head Pain Descriptors / Indicators: Headache Pain Frequency: Intermittent Pain Onset: On-going Patients Stated Pain Goal: 4 Pain Intervention(s): Medication (See eMAR)  Prior Functioning Cognitive/Linguistic Baseline: Baseline deficits Baseline deficit details: baseline memory deficits from dementia diagnosis but unsure of baseline at this time Type  of Home: House  Lives With: Spouse Available Help at Discharge: Family;Available PRN/intermittently Education: reported he completed 2 years of colllege 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 [employer] in [place])  SLP Evaluation Cognition Overall Cognitive Status: No family/caregiver present to determine baseline cognitive functioning Arousal/Alertness: Awake/alert Orientation Level: Oriented X4 Year: 2023 Month: January Day of Week: Incorrect Attention: Selective Selective Attention: Impaired Selective Attention Impairment: Verbal basic;Verbal complex Memory: Impaired Memory Impairment: Storage deficit;Decreased recall of new information Decreased Short Term Memory: Verbal basic;Functional basic Immediate Memory Recall: Sock;Bed;Blue Memory Recall Sock: Not able to recall Memory Recall Blue: With Cue Memory Recall Bed: Not able to recall Awareness: Impaired Awareness Impairment: Intellectual impairment Problem Solving: Impaired Problem Solving Impairment: Verbal complex Executive Function:  (all impaired) Safety/Judgment: Impaired Comments: Per chart, Alzheimer's at baseline  Comprehension Auditory Comprehension Overall Auditory Comprehension: Appears within functional limits for tasks assessed Expression Expression Primary Mode of Expression: Verbal Verbal Expression Overall Verbal Expression: Appears within functional limits for tasks assessed Initiation: No impairment Pragmatics: No impairment Written Expression Dominant Hand: Right Oral Motor Oral Motor/Sensory Function Overall Oral Motor/Sensory Function: Within functional limits Motor Speech Overall Motor Speech: Appears within functional limits for tasks assessed Respiration: Within functional limits Resonance: Within functional limits Articulation: Within  functional limitis Intelligibility: Intelligible Motor Planning: Witnin functional limits Motor Speech  Errors: Not applicable  Care Tool Care Tool Cognition Ability to hear (with hearing aid or hearing appliances if normally used Ability to hear (with hearing aid or hearing appliances if normally used): 1. Minimal difficulty - difficulty in some environments (e.g. when person speaks softly or setting is noisy)   Expression of Ideas and Wants Expression of Ideas and Wants: 3. Some difficulty - exhibits some difficulty with expressing needs and ideas (e.g, some words or finishing thoughts) or speech is not clear   Understanding Verbal and Non-Verbal Content Understanding Verbal and Non-Verbal Content: 3. Usually understands - understands most conversations, but misses some part/intent of message. Requires cues at times to understand  Memory/Recall Ability Memory/Recall Ability : Current season;That he or she is in a hospital/hospital unit   Short Term Goals: Week 1: SLP Short Term Goal 1 (Week 1): Patient will utilize memory aides (calendar, daily log/memory book, etc) to orient to time and recall recent events with minA verbal and visual cues. SLP Short Term Goal 2 (Week 1): Patient will demonstrate awareness to errors during functional problem solving/reasoning tasks with modA verbal cues. SLP Short Term Goal 3 (Week 1): Patient will demonstrate adequate recall of learned strategies/precautions with modA verbal, visual cues. SLP Short Term Goal 4 (Week 1): Patient will demonstrate adequate selective attention when completing problem solving tasks in mildly distracting environment, with minA verbal cues to redirect.  Refer to Care Plan for Long Term Goals  Recommendations for other services: None   Discharge Criteria: Patient will be discharged from SLP if patient refuses treatment 3 consecutive times without medical reason, if treatment goals not met, if there is a change in medical status, if patient makes no progress towards goals or if patient is discharged from hospital.  The above  assessment, treatment plan, treatment alternatives and goals were discussed and mutually agreed upon: by patient  Sonia Baller, MA, CCC-SLP Speech Therapy

## 2021-11-11 NOTE — Progress Notes (Signed)
Inpatient Rehabilitation  Patient information reviewed and entered into eRehab system by Bee Marchiano M. Jolyne Laye, M.A., CCC/SLP, PPS Coordinator.  Information including medical coding, functional ability and quality indicators will be reviewed and updated through discharge.    

## 2021-11-11 NOTE — Progress Notes (Signed)
PROGRESS NOTE   Subjective/Complaints:  Pt reports didn't fall asleep til 2am last night- weird for him. Very sleepy as a result this AM.    Also not constipated, but not going as much as normal- LBM yesterday.    ROS:  Pt denies SOB, abd pain, CP, N/V/C/D but not going as much, and vision changes   Objective:   No results found. Recent Labs    11/09/21 0209 11/11/21 0527  WBC 8.0 8.7  HGB 13.5 13.6  HCT 41.2 41.8  PLT 213 208   Recent Labs    11/09/21 0209 11/11/21 0527  NA 138 139  K 4.0 4.5  CL 105 104  CO2 28 28  GLUCOSE 177* 140*  BUN 19 21  CREATININE 1.22 1.24  CALCIUM 8.9 9.1    Intake/Output Summary (Last 24 hours) at 11/11/2021 0811 Last data filed at 11/10/2021 1703 Gross per 24 hour  Intake 0 ml  Output --  Net 0 ml        Physical Exam: Vital Signs Blood pressure 121/68, pulse 93, temperature 98.2 F (36.8 C), resp. rate 15, height 6\' 4"  (1.93 m), weight 64.5 kg, SpO2 96 %.   General: awake, alert, appropriate, sitting up in bed; sleepy; NAD HENT: conjugate gaze; oropharynx moist CV: regular rate; no JVD Pulmonary: CTA B/L; no W/R/R- good air movement GI: soft, NT, ND, (+)BS Psychiatric: appropriate; sleepy Neurological: alert, but sleepy Skin:    Comments: Lesion on right scalp (due to agent orange exposure.) also has a lot of ?Neurofibromas vs large freckles/moles everywhere Neurological:     Mental Status: He is easily aroused. He is lethargic.     Comments: Dysarthric speech. Able answer basic orientation questions without difficulty and used calender independently to recall date. Had difficulty performing finger to nose with LUE.  Cranial nerves II through XII intact, 5/5 strength in bilateral upper extremities.  3/5 RIght lower ext prox to 4/5 distal, 5/5 LLE.   Assessment/Plan: 1. Functional deficits which require 3+ hours per day of interdisciplinary therapy in a  comprehensive inpatient rehab setting. Physiatrist is providing close team supervision and 24 hour management of active medical problems listed below. Physiatrist and rehab team continue to assess barriers to discharge/monitor patient progress toward functional and medical goals  Care Tool:  Bathing  Bathing activity did not occur: Safety/medical concerns           Bathing assist       Upper Body Dressing/Undressing Upper body dressing Upper body dressing/undressing activity did not occur (including orthotics): Safety/medical concerns What is the patient wearing?: Hospital gown only    Upper body assist      Lower Body Dressing/Undressing Lower body dressing      What is the patient wearing?: Hospital gown only     Lower body assist Assist for lower body dressing: Minimal Assistance - Patient > 75%     Toileting Toileting    Toileting assist Assist for toileting: Minimal Assistance - Patient > 75%     Transfers Chair/bed transfer  Transfers assist  Chair/bed transfer activity did not occur: Safety/medical concerns        Locomotion Ambulation  Ambulation assist              Walk 10 feet activity   Assist           Walk 50 feet activity   Assist           Walk 150 feet activity   Assist           Walk 10 feet on uneven surface  activity   Assist           Wheelchair     Assist               Wheelchair 50 feet with 2 turns activity    Assist            Wheelchair 150 feet activity     Assist          Blood pressure 121/68, pulse 93, temperature 98.2 F (36.8 C), resp. rate 15, height 6\' 4"  (1.93 m), weight 64.5 kg, SpO2 96 %.    Medical Problem List and Plan: 1. Functional deficits secondary to SDH             -patient may shower             -ELOS/Goals: 10-14 days S             first day of evaluations- CIR- PT, OT and SLP 2.  Antithrombotics: -DVT/anticoagulation:  Mechanical:  Sequential compression devices, below knee Bilateral lower extremities             -antiplatelet therapy: Off Plavix due to hematuria.  3. Pain Management: tylenol prn.  4. Mood: LCSW to follow for evaluation and support.              -antipsychotic agents: N/A  5. Neuropsych: This patient maybe intermittently capable of making decisions on his own behalf. 6. Skin/Wound Care: Routine pressure relief measures.  7. Fluids/Electrolytes/Nutrition:  Monitor I/O. Check CMET in am.  8. Prostate cancer/Urinary retention: Monitor for hematuria.              --Abdominal pain and continues to leak urine around tubing. Patient/wife would like foley out. Will d/c and start I/O caths as hematuria started occurring with indwelling foley not I/O caths.  --On Proscar with ditropan for bladder spasms.  1/4- foley out and has clear urine this AM- volumes low so far- no cathing this AM 9. T2DM: Hgb A 1C-  Monitor BS ac/hs. Use SSI for elevated BS.              --increase metformin to 500mg  BID  1/4- CBGs well controlled, esp with increase in Metformin 10. Chronic combined systolic/diastolic CHF: Daily weights with low salt diet.  --Monitor for signs of overload.  11. Alzheimer's dementia: Followed by Dr. Delice Lesch. Continue Namenda and Aricept             --Wellbutrin for anger management.              --used ativan prn for anxiety issues.  12. Underweight/cachexia-  BMI 17.98: encourage healthy diet.  13. Constipation  1/4- will add Senokot 1 tab daily and monitor 14. Insomnia  1/4- will schedule Trazodone at 10 mg 50 mg QHS nightly- and monitor     LOS: 1 days A FACE TO FACE EVALUATION WAS PERFORMED  Kevin [name] 11/11/2021, 8:11 AM

## 2021-11-11 NOTE — Progress Notes (Signed)
Patient ID: JEMAL MISKELL, male   DOB: [birth year], 76 y.o.   MRN: [id number]  This patient is a return patient to CIR. Per EMR pt was admitted on 12/18 after a mechanical fall resulting in pubic rami fx. Pt  d/c to home on 12/27 with pt wife at CGA/Sup level of care  with [place] for HHPT/OT/SN (foley care) with DME delivered by [facility]. Pt returned to ED on 12/29 after falling backwards while coming out of the bathroom and gross hematuria noted in foley bag. Please refer to SW assessment completed on 12/19. SW will confirm  there are no barriers to discharge.   Loralee Pacas, MSW, [place] Office: [phone] Cell: [phone] Fax: [phone]

## 2021-11-11 NOTE — Progress Notes (Signed)
Occupational Therapy Session Note  Patient Details  Name: Kevin [name] MRN: [id number] Date of Birth: Aug 15, 1946  Today's Date: 11/11/2021 OT Individual Time: 3016-0109 OT Individual Time Calculation (min): 26 min    Short Term Goals: Week 1:  OT Short Term Goal 1 (Week 1): STG= LTG d/t ELOS  Skilled Therapeutic Interventions/Progress Updates:  Pt greeted at time of session resting bed level, no c/o dizziness and already had on thigh high TEDS. Politely declined toileting and ADL needs. Pt performing bed mobility with supervision to sit EOB, sit > stand and walked short distance to sink CGA. Standing to brush teeth with unilateral support. Transported to gym and focused on seated BUE strengthening with 4# dowel for 1x15 of the following: bicep curl, chest press, overhead press, FWD and backward circles all with cues for pacing to slow down and occasional cues for form. Pt transported back to room, stating he is not dizzy and wanting to sit up in wheelchair. Alarm on call bell in reach and educated to press red nurse button if starts to feel unwell or wants to lay down.   Therapy Documentation Precautions:  Precautions Precautions: Fall Precaution Comments: pacemaker, dizzines/tinnitis Restrictions Weight Bearing Restrictions: No RLE Weight Bearing: Weight bearing as tolerated    Therapy/Group: Individual Therapy  Viona Gilmore 11/11/2021, 1:00 PM

## 2021-11-11 NOTE — Evaluation (Signed)
Occupational Therapy Assessment and Plan  Patient Details  Name: JOE GEE MRN: [id number] Date of Birth: Apr 03, 1946  OT Diagnosis: cognitive deficits and muscle weakness (generalized) Rehab Potential: Rehab Potential (ACUTE ONLY): Fair ELOS: 5-7 days   Today's Date: 11/11/2021 OT Individual Time: 1000-1100 OT Individual Time Calculation (min): 60 min     Hospital Problem: Principal Problem:   SDH (subdural hematoma)   Past Medical History:  Past Medical History:  Diagnosis Date   Agent orange exposure    in [place]   Anemia    Dementia ([place])    Heart block AV second degree    a. s/p PPM implant with subsequent CRTD upgrade   High cholesterol    History of colon polyps    LBBB (left bundle branch block)    Nonischemic cardiomyopathy ([place])    a. MDT CRTD upgrade 2016   Pacemaker 08/27/2013   Dual-chamber Medtronic Adapta implanted January 2013 for bradycardia with alternating bundle branch block and 2:1 atrioventricular block    Prostate cancer ([place])    "not been treated yet; on a wait and see" (01/22/2015)   Type II diabetes mellitus ([place])    Past Surgical History:  Past Surgical History:  Procedure Laterality Date   BASAL CELL CARCINOMA EXCISION     "back, face, neck"   BI-VENTRICULAR IMPLANTABLE CARDIOVERTER DEFIBRILLATOR UPGRADE N/A 01/22/2015   MDT CRTD upgrade by Dr Rayann Heman   BIV ICD GENERATOR CHANGEOUT N/A 08/28/2020   Procedure: BIV ICD GENERATOR CHANGEOUT;  Surgeon: [name], MD;  Location: [facility];  Service: Cardiovascular;  Laterality: N/A;   CARDIAC CATHETERIZATION  01/27/2012   normal coronaries, dilated LV c/w nonischemic cardiomyopathy   EXAMINATION UNDER ANESTHESIA  04/06/2012   Procedure: EXAM UNDER ANESTHESIA;  Surgeon: Marcello Moores A. Cornett, MD;  Location: [facility];  Service: General;  Laterality: N/A;   INCISION AND DRAINAGE PERIRECTAL ABSCESS  ~ 2010; 2015   INGUINAL HERNIA REPAIR Left 1980   IR ANGIOGRAM PELVIS SELECTIVE OR  SUPRASELECTIVE  10/20/2021   IR ANGIOGRAM VISCERAL SELECTIVE  10/20/2021   IR EMBO ART  VEN HEMORR LYMPH EXTRAV  INC GUIDE ROADMAPPING  10/20/2021   IR US GUIDE VASC ACCESS LEFT  10/20/2021   LEFT HEART CATHETERIZATION WITH CORONARY ANGIOGRAM N/A 01/27/2012   Procedure: LEFT HEART CATHETERIZATION WITH CORONARY ANGIOGRAM;  Surgeon: Troy Sine, MD;  Location: [facility];  Service: Cardiovascular;  Laterality: N/A;   NM MYOCAR PERF WALL MOTION  01/21/2012   mod-severe perfusion defect due to infarct/scar w/mild perinfarct ischemia in the apical,basal inferoseptal,basal inferior,mid inferoseptal,midinferior and apical inferior regions   pacemaker battery     PERMANENT PACEMAKER INSERTION N/A 12/01/2011   Medtronic implanted by Dr Sallyanne Kuster   PROSTATE BIOPSY     PROSTATE BIOPSY     shrapnel surgery     "in [place]"   TONSILLECTOMY      Assessment & Plan Clinical Impression:  Shandell Jallow. Kohles is a 76 year old male with history of NICM, agent orange exposure, LBBB s/p PPM, T2DM, dementia?, prostate cancer, recent admission for fall complicated by pubic fracture and pelvic hematoma s/p coil embolization and urinary retention requiring foley. He was underwent CIR, was discharged to home on 11/03/21 with foley due to ongoing voiding issues as well as recommendations for CG to min assist. He was admitted on . He was 12/29 after a fall backwards while coming out of the bathroom and gross hematuria noted in foley bag.  He was  found to have small anterior falcine SDH which was stable on follow up CT per NS recs. Plavix discontinued and no neurologic changes noted.    CT stone was negative for cause of hematuria, ongoing bladder displacement to the left and no change in pelvis sidewall hematoma. Foley changed out in ED. Case discussed with Dr. Gloriann Loan who recommended addition of finasteride. UA showed hematuria with stable H/H and leucocytosis has resolved.  Patient reports history of dizziness, tinnitus and  occasional double vision for years and now has anxiety with increased fear of falling. Therapy initiated and patient noted to have balance deficits with poor posture, cognitive deficits with delay in processing simple one step commands as well as significant cognitive deficits. CIR recommended due to functional decline. Wife is looking into hiring help at home.  Patient transferred to CIR on 11/10/2021 .    Patient currently requires min with basic self-care skills secondary to muscle weakness, decreased cardiorespiratoy endurance, decreased attention, decreased problem solving, decreased safety awareness, decreased memory, delayed processing, and demonstrates behaviors consistent with [place], and decreased standing balance, decreased postural control, and decreased balance strategies.  Prior to hospitalization, patient could complete ADLs with supervision.  Patient will benefit from skilled intervention to decrease level of assist with basic self-care skills and increase independence with basic self-care skills prior to discharge home with care partner.  Anticipate patient will require 24 hour supervision and follow up home health.  OT - End of Session Activity Tolerance: Tolerates 10 - 20 min activity with multiple rests Endurance Deficit: Yes Endurance Deficit Description: Limited by generalized weakness and dizziness at times OT Assessment Rehab Potential (ACUTE ONLY): Fair OT Patient demonstrates impairments in the following area(s): Balance;Cognition;Endurance;Motor;Safety OT Basic ADL's Functional Problem(s): Grooming;Dressing;Bathing;Toileting OT Transfers Functional Problem(s): Toilet;Tub/Shower OT Additional Impairment(s): None OT Plan OT Intensity: Minimum of 1-2 x/day, 45 to 90 minutes OT Frequency: 5 out of 7 days OT Duration/Estimated Length of Stay: 5-7 days OT Treatment/Interventions: Balance/vestibular training;Disease mangement/prevention;Self Care/advanced ADL  retraining;Therapeutic Exercise;UE/LE Strength taining/ROM;Pain management;DME/adaptive equipment instruction;Cognitive remediation/compensation;Community reintegration;Patient/family education;UE/LE Coordination activities;Therapeutic Activities;Psychosocial support;Functional mobility training;Discharge planning OT Self Feeding Anticipated Outcome(s): no goal OT Basic Self-Care Anticipated Outcome(s): (S) OT Toileting Anticipated Outcome(s): (S) OT Bathroom Transfers Anticipated Outcome(s): (S) OT Recommendation Recommendations for Other Services: Speech consult Patient destination: Home Follow Up Recommendations: Home health OT Equipment Recommended: To be determined   OT Evaluation Precautions/Restrictions  Precautions Precautions: Fall Precaution Comments: pacemaker, dizzines/tinnitis Restrictions Weight Bearing Restrictions: No RLE Weight Bearing: Weight bearing as tolerated General Chart Reviewed: Yes Family/Caregiver Present: No Vital Signs   Pain Pain Assessment Pain Scale: 0-10 Pain Score: 5  Pain Type: Acute pain Pain Location: Head Pain Descriptors / Indicators: Headache Pain Frequency: Intermittent Pain Onset: On-going Patients Stated Pain Goal: 4 Pain Intervention(s): Medication (See eMAR) Home Living/Prior Functioning Home Living Family/patient expects to be discharged to:: Private residence Living Arrangements: Spouse/significant other Available Help at Discharge: Family, Available PRN/intermittently Type of Home: House Home Access: Stairs to enter Technical brewer of Steps: 2 Entrance Stairs-Rails: Can reach both Home Layout: One level Bathroom Shower/Tub: Multimedia programmer: Handicapped height Bathroom Accessibility: Yes Additional Comments: Pt is a poor historian, most info taken from chart review.  Lives With: Spouse IADL History Homemaking Responsibilities:  (unsure, pt poor historian) Occupation: Retired Type of Occupation:  Geophysicist/field seismologist Leisure and Hobbies: volunteering at [employer] Level of Independence: Needs assistance with ADLs, Needs assistance with homemaking, Needs assistance with tranfers (d/c CIR at (S)  level) Vision Baseline Vision/History: 1 Wears glasses Ability to See in Adequate Light: 1 Impaired Patient Visual Report: No change from baseline Perception  Perception: Within Functional Limits Praxis Praxis: Intact Cognition Overall Cognitive Status: Impaired/Different from baseline Arousal/Alertness: Awake/alert Orientation Level: Place;Situation;Person Person: Oriented Place: Oriented Situation: Oriented Year: 2022 Month: January Day of Week: Correct Memory: Impaired Memory Impairment: Decreased recall of new information;Decreased short term memory Decreased Short Term Memory: Verbal basic;Functional basic Immediate Memory Recall: Sock;Bed;Blue Memory Recall Sock: Not able to recall Memory Recall Blue: With Cue Memory Recall Bed: Not able to recall Attention: Selective Selective Attention: Impaired Selective Attention Impairment: Verbal basic;Functional basic Awareness: Impaired Awareness Impairment: Intellectual impairment Problem Solving: Impaired Problem Solving Impairment: Verbal basic;Functional basic Executive Function:  (all impaired) Safety/Judgment: Impaired Comments: Per chart, Alzheimer's at baseline Sensation Sensation Light Touch: Appears Intact Hot/Cold: Appears Intact Proprioception: Appears Intact Stereognosis: Appears Intact Coordination Gross Motor Movements are Fluid and Coordinated: No Fine Motor Movements are Fluid and Coordinated: Yes Motor  Motor Motor: Other (comment) Motor - Skilled Clinical Observations: generalized weakness  Trunk/Postural Assessment  Cervical Assessment Cervical Assessment: Exceptions to [facility] (forward head) Thoracic Assessment Thoracic Assessment: Exceptions to [facility] (rounded shoulders with kyphotic posture) Lumbar  Assessment Lumbar Assessment: Exceptions to [facility] Postural Control Postural Control: Deficits on evaluation Righting Reactions: Decreased Protective Responses: Decreased  Balance Balance Balance Assessed: Yes Static Sitting Balance Static Sitting - Balance Support: Feet supported;Bilateral upper extremity supported Static Sitting - Level of Assistance: 5: Stand by assistance Dynamic Sitting Balance Dynamic Sitting - Balance Support: Feet supported Dynamic Sitting - Level of Assistance: 5: Stand by assistance Static Standing Balance Static Standing - Balance Support: During functional activity;Bilateral upper extremity supported Static Standing - Level of Assistance: 4: Min assist Dynamic Standing Balance Dynamic Standing - Balance Support: Bilateral upper extremity supported;During functional activity Dynamic Standing - Level of Assistance: 4: Min assist Extremity/Trunk Assessment RUE Assessment RUE Assessment: Within Functional Limits LUE Assessment LUE Assessment: Within Functional Limits  Care Tool Care Tool Self Care Eating   Eating Assist Level: Set up assist    Oral Care    Oral Care Assist Level: Supervision/Verbal cueing    Bathing   Body parts bathed by patient: Right arm;Right lower leg;Left arm;Left lower leg;Chest;Abdomen;Face;Front perineal area;Buttocks;Right upper leg;Left upper leg     Assist Level: Minimal Assistance - Patient > 75%    Upper Body Dressing(including orthotics)   What is the patient wearing?: Pull over shirt   Assist Level: Supervision/Verbal cueing    Lower Body Dressing (excluding footwear)   What is the patient wearing?: Pants Assist for lower body dressing: Minimal Assistance - Patient > 75%    Putting on/Taking off footwear   What is the patient wearing?: Non-skid slipper socks Assist for footwear: Minimal Assistance - Patient > 75%       Care Tool Toileting Toileting activity   Assist for toileting: Minimal Assistance -  Patient > 75%     Care Tool Bed Mobility Roll left and right activity   Roll left and right assist level: Supervision/Verbal cueing    Sit to lying activity   Sit to lying assist level: Supervision/Verbal cueing    Lying to sitting on side of bed activity   Lying to sitting on side of bed assist level: the ability to move from lying on the back to sitting on the side of the bed with no back support.: Supervision/Verbal cueing     Care Tool Transfers Sit to stand transfer  Sit to stand assist level: Minimal Assistance - Patient > 75%    Chair/bed transfer   Chair/bed transfer assist level: Minimal Assistance - Patient > 75% Chair/bed transfer assistive device: Armrests;Walker   Armed forces technical officer   Assist Level: Minimal Assistance - Patient > 75%     Care Tool Cognition  Expression of Ideas and Wants Expression of Ideas and Wants: 3. Some difficulty - exhibits some difficulty with expressing needs and ideas (e.g, some words or finishing thoughts) or speech is not clear  Understanding Verbal and Non-Verbal Content Understanding Verbal and Non-Verbal Content: 3. Usually understands - understands most conversations, but misses some part/intent of message. Requires cues at times to understand   Memory/Recall Ability Memory/Recall Ability : Current season;Location of own room;Staff names and faces;That he or she is in a hospital/hospital unit   Refer to Care Plan for [place] 1  STG= LTG d/t ELOS   Recommendations for other services: None    Skilled Therapeutic Intervention  Skilled OT evaluation completed. Pt in chair at start of session with c/o dizziness. BP assessed and low- 93/75. Donned thigh high teds and it rose to 101/76. Pt completed remainder of session without c/o any OH symptoms and it was assessed intermittently with no low readings obtained. Pt completed ADLs at the sink as described below. Cueing provided and education on fall risk  reduction strategies and energy conservation. Pt completed several ambulatory transfers with the RW with min A, fading to CGA. Anticipate a 5-7 day ELOS, with 24/7 supervision. Pt was left sitting in the recliner with all needs met, chair alarm set.   ADL ADL Eating: Supervision/safety Where Assessed-Eating: Wheelchair Grooming: Supervision/safety Where Assessed-Grooming: Clinical biochemist Bathing: Supervision/safety Where Assessed-Upper Body Bathing: Wheelchair Lower Body Bathing: Minimal assistance Where Assessed-Lower Body Bathing: Wheelchair Upper Body Dressing: Supervision/safety Where Assessed-Upper Body Dressing: Wheelchair Lower Body Dressing: Minimal assistance Where Assessed-Lower Body Dressing: Wheelchair Toileting: Minimal assistance Where Assessed-Toileting: Glass blower/designer: Psychiatric nurse Method: Counselling psychologist: Raised toilet seat;Grab bars Tub/Shower Transfer: Unable to assess Tub/Shower Transfer Method: Unable to assess Mobility  Bed Mobility Bed Mobility: Rolling Right;Rolling Left;Supine to Sit;Sit to Supine Rolling Right: Supervision/verbal cueing Rolling Left: Supervision/Verbal cueing Supine to Sit: Supervision/Verbal cueing Sit to Supine: Supervision/Verbal cueing Transfers Sit to Stand: Minimal Assistance - Patient > 75% Stand to Sit: Minimal Assistance - Patient > 75%   Discharge Criteria: Patient will be discharged from OT if patient refuses treatment 3 consecutive times without medical reason, if treatment goals not met, if there is a change in medical status, if patient makes no progress towards goals or if patient is discharged from hospital.  The above assessment, treatment plan, treatment alternatives and goals were discussed and mutually agreed upon: by patient  Curtis Sites 11/11/2021, 11:20 AM

## 2021-11-11 NOTE — Progress Notes (Signed)
Inpatient Rehabilitation Admission Medication Review by a Pharmacist  A complete drug regimen review was completed for this patient to identify any potential clinically significant medication issues.  High Risk Drug Classes Is patient taking? Indication by Medication  Antipsychotic Yes Compazine, N/V  Anticoagulant No   Antibiotic No   Opioid No   Antiplatelet No   Hypoglycemics/insulin Yes SSI, metformin for DM  Vasoactive Medication No   Chemotherapy No   Other Yes Lipitor for HLD Oxybutynin for incontinence Wellbutrin for mood Proscar for BPH Aricept/Namenda for dementia Loratadine for allergies     Type of Medication Issue Identified Description of Issue Recommendation(s)  Drug Interaction(s) (clinically significant)     Duplicate Therapy     Allergy     No Medication Administration End Date     Incorrect Dose     Additional Drug Therapy Needed     Significant med changes from prior encounter (inform family/care partners about these prior to discharge).    Other       Clinically significant medication issues were identified that warrant physician communication and completion of prescribed/recommended actions by midnight of the next day:  No  Pharmacist comments: None  Time spent performing this drug regimen review (minutes):  20 minutes   Tad Moore 11/11/2021 7:53 AM

## 2021-11-11 NOTE — Evaluation (Signed)
Physical Therapy Assessment and Plan  Patient Details  Name: Kevin [name] MRN: [id number] Date of Birth: 23-Feb-1946  PT Diagnosis: Abnormal posture, Abnormality of gait, Cognitive deficits, and Muscle weakness Rehab Potential: Good ELOS: 5-7 days   Today's Date: 11/11/2021 PT Individual Time: 4098-1191 PT Individual Time Calculation (min): 60 min    Hospital Problem: Principal Problem:   SDH (subdural hematoma)   Past Medical History:  Past Medical History:  Diagnosis Date   Agent orange exposure    in [place]   Anemia    Dementia ([place])    Heart block AV second degree    a. s/p PPM implant with subsequent CRTD upgrade   High cholesterol    History of colon polyps    LBBB (left bundle branch block)    Nonischemic cardiomyopathy ([place])    a. MDT CRTD upgrade 2016   Pacemaker 08/27/2013   Dual-chamber Medtronic Adapta implanted January 2013 for bradycardia with alternating bundle branch block and 2:1 atrioventricular block    Prostate cancer ([place])    "not been treated yet; on a wait and see" (01/22/2015)   Type II diabetes mellitus ([place])    Past Surgical History:  Past Surgical History:  Procedure Laterality Date   BASAL CELL CARCINOMA EXCISION     "back, face, neck"   BI-VENTRICULAR IMPLANTABLE CARDIOVERTER DEFIBRILLATOR UPGRADE N/A 01/22/2015   MDT CRTD upgrade by Dr Rayann Heman   BIV ICD GENERATOR CHANGEOUT N/A 08/28/2020   Procedure: BIV ICD GENERATOR CHANGEOUT;  Surgeon: [name], MD;  Location: [facility];  Service: Cardiovascular;  Laterality: N/A;   CARDIAC CATHETERIZATION  01/27/2012   normal coronaries, dilated LV c/w nonischemic cardiomyopathy   EXAMINATION UNDER ANESTHESIA  04/06/2012   Procedure: EXAM UNDER ANESTHESIA;  Surgeon: Marcello Moores A. Cornett, MD;  Location: [facility];  Service: General;  Laterality: N/A;   INCISION AND DRAINAGE PERIRECTAL ABSCESS  ~ 2010; 2015   INGUINAL HERNIA REPAIR Left 1980   IR ANGIOGRAM PELVIS SELECTIVE OR SUPRASELECTIVE   10/20/2021   IR ANGIOGRAM VISCERAL SELECTIVE  10/20/2021   IR EMBO ART  VEN HEMORR LYMPH EXTRAV  INC GUIDE ROADMAPPING  10/20/2021   IR US GUIDE VASC ACCESS LEFT  10/20/2021   LEFT HEART CATHETERIZATION WITH CORONARY ANGIOGRAM N/A 01/27/2012   Procedure: LEFT HEART CATHETERIZATION WITH CORONARY ANGIOGRAM;  Surgeon: Troy Sine, MD;  Location: [facility] CATH LAB;  Service: Cardiovascular;  Laterality: N/A;   NM MYOCAR PERF WALL MOTION  01/21/2012   mod-severe perfusion defect due to infarct/scar w/mild perinfarct ischemia in the apical,basal inferoseptal,basal inferior,mid inferoseptal,midinferior and apical inferior regions   pacemaker battery     PERMANENT PACEMAKER INSERTION N/A 12/01/2011   Medtronic implanted by Dr Sallyanne Kuster   PROSTATE BIOPSY     PROSTATE BIOPSY     shrapnel surgery     "in [place]"   TONSILLECTOMY      Assessment & Plan Clinical Impression:  Kevin [name] is a 76 year old male with history of NICM, agent orange exposure, LBBB s/p PPM, T2DM, dementia?, prostate cancer, recent admission for fall complicated by pubic fracture and pelvic hematoma s/p coil embolization and urinary retention requiring foley. He was underwent CIR, was discharged to home on 11/03/21 with foley due to ongoing voiding issues as well as recommendations for CG to min assist. He was admitted on . He was 12/29 after a fall backwards while coming out of the bathroom and gross hematuria noted in foley bag.  He was found  to have small anterior falcine SDH which was stable on follow up CT per NS recs. Plavix discontinued and no neurologic changes noted.    CT stone was negative for cause of hematuria, ongoing bladder displacement to the left and no change in pelvis sidewall hematoma. Foley changed out in ED. Case discussed with Dr. Gloriann Loan who recommended addition of finasteride. UA showed hematuria with stable H/H and leucocytosis has resolved.  Patient reports history of dizziness, tinnitus and occasional  double vision for years and now has anxiety with increased fear of falling. Therapy initiated and patient noted to have balance deficits with poor posture, cognitive deficits with delay in processing simple one step commands as well as significant cognitive deficits. CIR recommended due to functional decline. Wife is looking into hiring help at home. Patient transferred to CIR on 11/10/2021 .   Patient currently requires min with mobility secondary to muscle weakness, decreased cardiorespiratoy endurance, and decreased sitting balance, decreased standing balance, decreased postural control, and decreased balance strategies.  Prior to hospitalization, patient was supervision with mobility and lived with Spouse in a House home.  Home access is 2Stairs to enter.  Patient will benefit from skilled PT intervention to maximize safe functional mobility, minimize fall risk, and decrease caregiver burden for planned discharge home with 24 hour supervision.  Anticipate patient will benefit from follow up [place] at discharge.  PT - End of Session Activity Tolerance: Tolerates 30+ min activity with multiple rests Endurance Deficit: Yes Endurance Deficit Description: Limited by generalized weakness and dizziness at times PT Assessment Rehab Potential (ACUTE/IP ONLY): Good PT Barriers to Discharge: Decreased caregiver support PT Patient demonstrates impairments in the following area(s): Balance;Endurance;Safety;Other (comment) (dizziness/orthostatic hypotension) PT Transfers Functional Problem(s): Bed Mobility;Bed to Chair;Car;Furniture;Floor PT Locomotion Functional Problem(s): Ambulation;Wheelchair Mobility;Stairs PT Plan PT Intensity: Minimum of 1-2 x/day ,45 to 90 minutes PT Frequency: 5 out of 7 days PT Duration Estimated Length of Stay: 5-7 days PT Treatment/Interventions: Ambulation/gait training;Balance/vestibular training;Cognitive remediation/compensation;Community reintegration;Discharge planning;Disease  management/prevention;DME/adaptive equipment instruction;Functional mobility training;Neuromuscular re-education;Pain management;Patient/family education;Psychosocial support;Stair training;Therapeutic Activities;Therapeutic Exercise;UE/LE Strength taining/ROM;UE/LE Coordination activities PT Locomotion Anticipated Outcome(s): Supervision at ambulatory level PT Recommendation Follow Up Recommendations: 24 hour supervision/assistance;Outpatient PT Patient destination: Home Equipment Recommended: None recommended by PT (already owns RW)   PT Evaluation Precautions/Restrictions Precautions Precautions: Fall Precaution Comments: pacemaker, dizzines/tinnitis Restrictions Weight Bearing Restrictions: No Pain Interference Pain Interference Pain Effect on Sleep: 0. Does not apply - I have not had any pain or hurting in the past 5 days Pain Interference with Therapy Activities: 1. Rarely or not at all Pain Interference with Day-to-Day Activities: 1. Rarely or not at all Home Living/Prior Harvest Available Help at Discharge: Family;Available PRN/intermittently Type of Home: House Home Access: Stairs to enter [employer] of Steps: 2 Entrance Stairs-Rails: Right;Left;Can reach both Home Layout: One level Additional Comments: Pt is a poor historian, most info taken from chart review.  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 Vision/Perception  Vision - History Baseline Vision: Wears glasses only for reading Perception Perception: Within Functional Limits Praxis Praxis: Intact  Cognition Overall Cognitive Status: No family/caregiver present to determine baseline cognitive functioning Arousal/Alertness:  Awake/alert Year: 2017 Month: January Day of Week: Incorrect Attention: Selective Selective Attention: Impaired Selective Attention Impairment: Verbal basic;Verbal complex Memory: Impaired Memory Impairment: Storage  deficit;Decreased recall of new information Awareness: Impaired Awareness Impairment: Intellectual impairment Problem Solving Impairment: Verbal complex Safety/Judgment: Impaired Comments: Per chart, Alzheimer's at baseline Sensation Sensation Light Touch: Appears Intact Proprioception: Appears Intact Coordination Gross Motor Movements are Fluid and Coordinated: No Fine Motor Movements are Fluid and Coordinated: Yes Motor  Motor Motor: Other (comment) Motor - Skilled Clinical Observations: generalized weakness  Trunk/Postural Assessment  Cervical Assessment Cervical Assessment: Exceptions to [facility] (forward head) Thoracic Assessment Thoracic Assessment: Exceptions to [facility] (rounded shoulders, kyphotic posture) Lumbar Assessment Lumbar Assessment: Exceptions to [facility] (posterior pelvic tilt) Postural Control Postural Control: Deficits on evaluation Righting Reactions: Decreased Protective Responses: Decreased  Balance Balance Balance Assessed: Yes Standardized Balance Assessment Standardized Balance Assessment: Berg Balance Test Berg Balance Test Sit to Stand: Needs moderate or maximal assist to stand Standing Unsupported: Able to stand 2 minutes with supervision Sitting with Back Unsupported but Feet Supported on Floor or Stool: Able to sit safely and securely 2 minutes Stand to Sit: Needs assistance to sit Transfers: Needs one person to assist Standing Unsupported with Eyes Closed: Needs help to keep from falling Standing Ubsupported with Feet Together: Needs help to attain position and unable to hold for 15 seconds From Standing, Reach Forward with Outstretched Arm: Loses balance while trying/requires external support From Standing Position, Pick up Object from  Floor: Unable to try/needs assist to keep balance From Standing Position, Turn to Look Behind Over each Shoulder: Needs assist to keep from losing balance and falling Turn 360 Degrees: Needs assistance while turning Standing Unsupported, Alternately Place Feet on Step/Stool: Needs assistance to keep from falling or unable to try Standing Unsupported, One Foot in Front: Loses balance while stepping or standing Standing on One Leg: Unable to try or needs assist to prevent fall Total Score: 8 Static Sitting Balance Static Sitting - Balance Support: Feet supported;Bilateral upper extremity supported Static Sitting - Level of Assistance: 5: Stand by assistance Dynamic Sitting Balance Dynamic Sitting - Balance Support: Feet supported Dynamic Sitting - Level of Assistance: 5: Stand by assistance Static Standing Balance Static Standing - Balance Support: During functional activity;Bilateral upper extremity supported Static Standing - Level of Assistance: 4: Min assist Dynamic Standing Balance Dynamic Standing - Balance Support: Bilateral upper extremity supported;During functional activity Dynamic Standing - Level of Assistance: 4: Min assist Extremity Assessment   RLE Assessment RLE Assessment: Within Functional Limits RLE Strength Right Hip Flexion: 4/5 Right Knee Flexion: 5/5 Right Knee Extension: 4+/5 Right Ankle Dorsiflexion: 4+/5 LLE Assessment LLE Assessment: Within Functional Limits LLE Strength Left Hip Flexion: 5/5 Left Knee Flexion: 5/5 Left Knee Extension: 5/5 Left Ankle Dorsiflexion: 4/5  Care Tool Care Tool Bed Mobility Roll left and right activity   Roll left and right assist level: Supervision/Verbal cueing    Sit to lying activity   Sit to lying assist level: Supervision/Verbal cueing    Lying to sitting on side of bed activity   Lying to sitting on side of bed assist level: the ability to move from lying on the back to sitting on the side of the bed with no back  support.: Supervision/Verbal cueing     Care Tool Transfers Sit to stand transfer   Sit to stand assist level: Contact Guard/Touching assist Sit to stand assistive device: Walker  Chair/bed transfer   Chair/bed transfer assist level: Contact Guard/Touching assist Chair/bed transfer assistive device: Proofreader transfer assist level: Contact Guard/Touching assist  Care Tool Locomotion Ambulation   Assist level: Contact Guard/Touching assist Assistive device: Walker-rolling Max distance: 150'  Walk 10 feet activity   Assist level: Contact Guard/Touching assist Assistive device: Walker-rolling   Walk 50 feet with 2 turns activity   Assist level: Contact Guard/Touching assist Assistive device: Walker-rolling  Walk 150 feet activity   Assist level: Contact Guard/Touching assist Assistive device: Walker-rolling  Walk 10 feet on uneven surfaces activity Walk 10 feet on uneven surfaces activity did not occur: Safety/medical concerns      Stairs   Assist level: Contact Guard/Touching assist Stairs assistive device: 2 hand rails Max number of stairs: 8 (6")  Walk up/down 1 step activity   Walk up/down 1 step (curb) assist level: Contact Guard/Touching assist Walk up/down 1 step or curb assistive device: 2 hand rails  Walk up/down 4 steps activity   Walk up/down 4 steps assist level: Contact Guard/Touching assist Walk up/down 4 steps assistive device: 2 hand rails  Walk up/down 12 steps activity Walk up/down 12 steps activity did not occur: Safety/medical concerns      Pick up small objects from floor   Pick up small object from the floor assist level: Dependent - Patient 0% Pick up small object from the floor assistive device: RW  Wheelchair Is the patient using a wheelchair?: No          Wheel 50 feet with 2 turns activity      Wheel 150 feet activity        Refer to Care Plan for Long Term Goals  [place]  1 PT Short Term Goal 1 (Week 1): = LTG due to ELOS  Recommendations for other services: None   Skilled Therapeutic Intervention Evaluation completed (see details above and below) with education on PT POC and goals and individual treatment initiated with focus on functional transfer and gait assessment. Pt received supine in bed asleep, arousable and agreeable to PT session. No complaints of pain. Bed mobility Supervision from flat bed with no bedrail. Sit to stand with CGA to RW during session as well as transfers with CGA and RW during session. Ambulation up to 150 ft with RW at [facility] level. Car transfer with CGA and RW with cues for safe transfer technique. Ascend/descend 8 x 6" stairs with 2 handrails and CGA for balance, step-through gait pattern. Pt's BP initially assessed in sitting with THT, 99/60 as he is asymptomatic. Pt does have onset of dizziness with standing activity and with gait, BP assessed to be 112/67 while symptomatic and symptoms resolve with seated rest break. Initiated Berg balance test with patient, unable to tolerate further than first few testing items due to dizziness with prolonged standing activity and scores 8/56 on items he was able to complete. Pt returned to room and requested to return to bed, Supervision for bed mobility. Pt left supine in bed with needs in reach, bed alarm in place.  Mobility Bed Mobility Bed Mobility: Rolling Right;Rolling Left;Supine to Sit;Sit to Supine Rolling Right: Supervision/verbal cueing Rolling Left: Supervision/Verbal cueing Supine to Sit: Supervision/Verbal cueing Sit to Supine: Supervision/Verbal cueing Transfers Transfers: Sit to Stand;Stand Pivot Transfers;Stand to Sit Sit to Stand: Contact Guard/Touching assist Stand to Sit: Contact Guard/Touching assist Stand Pivot Transfers: Contact Guard/Touching assist Stand Pivot Transfer Details: Verbal cues for technique;Verbal cues for safe use of DME/AE Transfer (Assistive device):  Rolling walker Locomotion  Gait Gait Distance (Feet): 150 Feet Assistive device: Rolling walker Gait Assistance Details: Verbal cues for technique;Verbal cues  for safe use of DME/AE Gait Gait Pattern: Impaired Gait Pattern: Left flexed knee in stance;Right flexed knee in stance;Antalgic;Decreased weight shift to right;Decreased stance time - left;Decreased step length - right;Narrow base of support Gait velocity: decreased Stairs / Additional Locomotion Stairs: Yes Stairs Assistance: Contact Guard/Touching assist Stair Management Technique: Two rails;Alternating pattern Number of Stairs: 8 Height of Stairs: 6 Wheelchair Mobility Wheelchair Mobility: No   Discharge Criteria: Patient will be discharged from PT if patient refuses treatment 3 consecutive times without medical reason, if treatment goals not met, if there is a change in medical status, if patient makes no progress towards goals or if patient is discharged from hospital.  The above assessment, treatment plan, treatment alternatives and goals were discussed and mutually agreed upon: by patient   Excell Seltzer, PT, DPT, CSRS 11/11/2021, 5:37 PM

## 2021-11-11 NOTE — Plan of Care (Signed)
°  Problem: RH Balance Goal: LTG Patient will maintain dynamic standing balance (PT) Description: LTG:  Patient will maintain dynamic standing balance with assistance during mobility activities (PT) Flowsheets (Taken 11/11/2021 1743) LTG: Pt will maintain dynamic standing balance during mobility activities with:: Supervision/Verbal cueing   Problem: Sit to Stand Goal: LTG:  Patient will perform sit to stand with assistance level (PT) Description: LTG:  Patient will perform sit to stand with assistance level (PT) Flowsheets (Taken 11/11/2021 1743) LTG: PT will perform sit to stand in preparation for functional mobility with assistance level: Supervision/Verbal cueing   Problem: RH Bed Mobility Goal: LTG Patient will perform bed mobility with assist (PT) Description: LTG: Patient will perform bed mobility with assistance, with/without cues (PT). Flowsheets (Taken 11/11/2021 1743) LTG: Pt will perform bed mobility with assistance level of: Independent   Problem: RH Bed to Chair Transfers Goal: LTG Patient will perform bed/chair transfers w/assist (PT) Description: LTG: Patient will perform bed to chair transfers with assistance (PT). Flowsheets (Taken 11/11/2021 1743) LTG: Pt will perform Bed to Chair Transfers with assistance level: Supervision/Verbal cueing   Problem: RH Car Transfers Goal: LTG Patient will perform car transfers with assist (PT) Description: LTG: Patient will perform car transfers with assistance (PT). Flowsheets (Taken 11/11/2021 1743) LTG: Pt will perform car transfers with assist:: Supervision/Verbal cueing   Problem: RH Ambulation Goal: LTG Patient will ambulate in controlled environment (PT) Description: LTG: Patient will ambulate in a controlled environment, # of feet with assistance (PT). Flowsheets (Taken 11/11/2021 1743) LTG: Pt will ambulate in controlled environ  assist needed:: Supervision/Verbal cueing LTG: Ambulation distance in controlled environment: 150 ft with  LRAD Goal: LTG Patient will ambulate in home environment (PT) Description: LTG: Patient will ambulate in home environment, # of feet with assistance (PT). Flowsheets (Taken 11/11/2021 1743) LTG: Pt will ambulate in home environ  assist needed:: Supervision/Verbal cueing LTG: Ambulation distance in home environment: 75 ft with LRAD   Problem: RH Stairs Goal: LTG Patient will ambulate up and down stairs w/assist (PT) Description: LTG: Patient will ambulate up and down # of stairs with assistance (PT) Flowsheets (Taken 11/11/2021 1743) LTG: Pt will ambulate up/down stairs assist needed:: Supervision/Verbal cueing LTG: Pt will  ambulate up and down number of stairs: one step with RW for home access

## 2021-11-12 DIAGNOSIS — E119 Type 2 diabetes mellitus without complications: Secondary | ICD-10-CM

## 2021-11-12 DIAGNOSIS — I5042 Chronic combined systolic (congestive) and diastolic (congestive) heart failure: Secondary | ICD-10-CM

## 2021-11-12 DIAGNOSIS — G479 Sleep disorder, unspecified: Secondary | ICD-10-CM

## 2021-11-12 DIAGNOSIS — G301 Alzheimer's disease with late onset: Secondary | ICD-10-CM

## 2021-11-12 DIAGNOSIS — S065XAA Traumatic subdural hemorrhage with loss of consciousness status unknown, initial encounter: Secondary | ICD-10-CM | POA: Diagnosis not present

## 2021-11-12 DIAGNOSIS — F02A4 Dementia in other diseases classified elsewhere, mild, with anxiety: Secondary | ICD-10-CM

## 2021-11-12 LAB — GLUCOSE, CAPILLARY
Glucose-Capillary: 105 mg/dL — ABNORMAL HIGH (ref 70–99)
Glucose-Capillary: 183 mg/dL — ABNORMAL HIGH (ref 70–99)
Glucose-Capillary: 134 mg/dL — ABNORMAL HIGH (ref 70–99)
Glucose-Capillary: 119 mg/dL — ABNORMAL HIGH (ref 70–99)

## 2021-11-12 MED ORDER — ENSURE MAX PROTEIN PO LIQD
11.0000 [oz_av] | Freq: Every day | ORAL | Status: DC
  Administered 2021-11-12 – 2021-11-19 (×8): 11 [oz_av] via ORAL

## 2021-11-12 MED ORDER — SENNOSIDES-DOCUSATE SODIUM 8.6-50 MG PO TABS
2.0000 | ORAL_TABLET | Freq: Every day | ORAL | Status: DC
  Administered 2021-11-12 – 2021-11-19 (×7): 2 via ORAL
  Filled 2021-11-12 (×8): qty 2

## 2021-11-12 NOTE — Progress Notes (Signed)
Physical Therapy Session Note  Patient Details  Name: Kevin [name] MRN: [id number] Date of Birth: Mar 31, 1946  Today's Date: 11/12/2021 PT Individual Time: 1002-1058 PT Individual Time Calculation (min): 56 min   Short Term Goals: Week 1:  PT Short Term Goal 1 (Week 1): = LTG due to ELOS  Skilled Therapeutic Interventions/Progress Updates:     Pt received supine in bed and agrees to therapy. No complaint of pain. PT assists pt to don bilateral thigh high ted hose while supine. Pt performs bridging to assist with pulling up pants with cues on body mechanics. Pt performs supine to sit with verbal cues on initiation and ensuring pt allows BP to stabilize once sitting at EOB. Sit to stand from EOB with cues to push off bed with hands rather than pull on RW, with CGA. Pt transfer sto WC with cues for positioning. WC transport to gym for time management. Pt ambulates x300' with RW and CGA, with cues for upright gaze to improve posture and balance. Following seated rest break, pt ambulates additional 150' with same cueing. Pt then completes x10:00 on Nustep at workload of 5 with average steps per minute ~50. Performed for strength and endurance training. WC transport back to room. Left seated with alarm intact and all needs within reach.  Therapy Documentation Precautions:  Precautions Precautions: Fall Precaution Comments: pacemaker, dizzines/tinnitis Restrictions Weight Bearing Restrictions: No RLE Weight Bearing: Weight bearing as tolerated   Therapy/Group: Individual Therapy  Breck Coons, PT, DPT 11/12/2021, 4:15 PM

## 2021-11-12 NOTE — Progress Notes (Signed)
Occupational Therapy TBI Note  Patient Details  Name: Kevin [name] MRN: [id number] Date of Birth: [birth year]  Today's Date: 11/12/2021 OT Individual Time: 1300-1400 OT Individual Time Calculation (min): 60 min    Short Term Goals: Week 1:  OT Short Term Goal 1 (Week 1): STG= LTG d/t ELOS  Skilled Therapeutic Interventions/Progress Updates:    Pt greeted semi-reclined in bed with wife present. Pt shaving with electric razor in bed. With encouragement, pt agreeable to get up to the sink to finish shaving. Min A sit<>stand with RW and verbal cues for RW placement and safwety awareness. Pt reported urgent need to urinate. Pt ambulated to the bathroom with RW and min A with poor body and safety awareness. Pt rpeorted need to stand to urinate. Pt stood with CGA for balance, but was unable to urinate. Pt tolerated standing for hand washing and finishing shaving task with close supervision. Pt sat in wc and brought to therapy gym in wc for time management. Problem solving activity using colored slideboard puzzle. Pt needed moderate cues for completx task, then progressing to minimal cues with repetition. UB there-ex using 3 lb dowel rod, 3 sets of 10 chest press, bicep curl, and straight arm raise. Pt returned to room and left seated in wc with chair alarm pad on, call bell in reach, and wife present.   Therapy Documentation Precautions:  Precautions Precautions: Fall Precaution Comments: pacemaker, dizzines/tinnitis Restrictions Weight Bearing Restrictions: No RLE Weight Bearing: Weight bearing as tolerated Pain:  Denies pain Agitated Behavior Scale: TBI Observation Details Observation Environment: CIR Start of observation period - Date: 11/12/21 Start of observation period - Time: 1300 End of observation period - Date: 11/12/21 End of observation period - Time: 1400 Agitated Behavior Scale (DO NOT LEAVE BLANKS) Short attention span, easy distractibility, inability to concentrate:  Absent Impulsive, impatient, low tolerance for pain or frustration: Absent Uncooperative, resistant to care, demanding: Absent Violent and/or threatening violence toward people or property: Absent Explosive and/or unpredictable anger: Absent Rocking, rubbing, moaning, or other self-stimulating behavior: Absent Pulling at tubes, restraints, etc.: Absent Wandering from treatment areas: Absent Restlessness, pacing, excessive movement: Absent Repetitive behaviors, motor, and/or verbal: Absent Rapid, loud, or excessive talking: Absent Sudden changes of mood: Absent Easily initiated or excessive crying and/or laughter: Absent Self-abusiveness, physical and/or verbal: Absent Agitated behavior scale total score: 14    Therapy/Group: Individual Therapy  Valma Cava 11/12/2021, 2:11 PM

## 2021-11-12 NOTE — Care Management (Signed)
Inpatient [facility] Individual Statement of Services  Patient Name:  Kevin [name]  Date:  11/12/2021  Welcome to the [facility].  Our goal is to provide you with an individualized program based on your diagnosis and situation, designed to meet your specific needs.  With this comprehensive rehabilitation program, you will be expected to participate in at least 3 hours of rehabilitation therapies Monday-Friday, with modified therapy programming on the weekends.  Your rehabilitation program will include the following services:  Physical Therapy (PT), Occupational Therapy (OT), 24 hour per day rehabilitation nursing, Therapeutic Recreaction (TR), Psychology, Neuropsychology, Care Coordinator, Rehabilitation Medicine, [place], and Other  Weekly team conferences will be held on Tuesdays to discuss your progress.  Your Inpatient Rehabilitation Care Coordinator will talk with you frequently to get your input and to update you on team discussions.  Team conferences with you and your family in attendance may also be held.  Expected length of stay: 5-7 days    Overall anticipated outcome: Supervision  Depending on your progress and recovery, your program may change. Your Inpatient Rehabilitation Care Coordinator will coordinate services and will keep you informed of any changes. Your Inpatient Rehabilitation Care Coordinator's name and contact numbers are listed  below.  The following services may also be recommended but are not provided by the [facility] will be made to provide these services after discharge if needed.  Arrangements include referral to agencies that provide these services.  Your insurance has been verified to be:  Veterinary surgeon primary doctor is:   Programmer, applications  Pertinent information will be shared with your doctor and your insurance company.  Inpatient Rehabilitation Care Coordinator:  Cathleen Corti [phone] or ([phone]  Information discussed with and copy given to patient by: Rana Snare, 11/12/2021, 10:07 AM

## 2021-11-12 NOTE — Progress Notes (Signed)
PROGRESS NOTE   Subjective/Complaints:  Pt reports reasonable night. SLP is waiting for him to finish breakfast. Received some "inedible" pancakes and sausage. Is eating cereal and yogurt  ROS: Patient denies fever, rash, sore throat, blurred vision, nausea, vomiting, diarrhea, cough, shortness of breath or chest pain, joint or back pain, headache, or mood change.   Objective:   No results found. Recent Labs    11/11/21 0527  WBC 8.7  HGB 13.6  HCT 41.8  PLT 208   Recent Labs    11/11/21 0527  NA 139  K 4.5  CL 104  CO2 28  GLUCOSE 140*  BUN 21  CREATININE 1.24  CALCIUM 9.1    Intake/Output Summary (Last 24 hours) at 11/12/2021 1019 Last data filed at 11/12/2021 0725 Gross per 24 hour  Intake 360 ml  Output 700 ml  Net -340 ml        Physical Exam: Vital Signs Blood pressure 115/70, pulse 82, temperature 98.2 F (36.8 C), temperature source Oral, resp. rate 18, height 6\' 4"  (1.93 m), weight 66.6 kg, SpO2 96 %.   Constitutional: No distress . Vital signs reviewed. HEENT: NCAT, EOMI, oral membranes moist Neck: supple Cardiovascular: RRR without murmur. No JVD    Respiratory/Chest: CTA Bilaterally without wheezes or rales. Normal effort    GI/Abdomen: BS +, non-tender, non-distended Ext: no clubbing, cyanosis, or edema Psych: pleasant and cooperative  Skin:    Comments: Lesion on right scalp (due to agent orange exposure--stable) also has a lot of ?Neurofibromas vs large freckles/moles everywhere Neurological:     Mental Status: awake and alert     Comments: Dysarthric speech. Able answer basic orientation questions without difficulty and used calender independently to recall date.    Cranial nerves II through XII intact, 5/5 strength in bilateral upper extremities.  3/5 RIght lower ext prox to 4/5 distal, 5/5 LLE. --stable motor  Assessment/Plan: 1. Functional deficits which require 3+ hours per day of  interdisciplinary therapy in a comprehensive inpatient rehab setting. Physiatrist is providing close team supervision and 24 hour management of active medical problems listed below. Physiatrist and rehab team continue to assess barriers to discharge/monitor patient progress toward functional and medical goals  Care Tool:  Bathing  Bathing activity did not occur: Safety/medical concerns Body parts bathed by patient: Right arm, Right lower leg, Left arm, Left lower leg, Chest, Abdomen, Face, Front perineal area, Buttocks, Right upper leg, Left upper leg         Bathing assist Assist Level: Minimal Assistance - Patient > 75%     Upper Body Dressing/Undressing Upper body dressing Upper body dressing/undressing activity did not occur (including orthotics): Safety/medical concerns What is the patient wearing?: Pull over shirt    Upper body assist Assist Level: Set up assist    Lower Body Dressing/Undressing Lower body dressing      What is the patient wearing?: Pants     Lower body assist Assist for lower body dressing: Set up assist     Toileting Toileting    Toileting assist Assist for toileting: Contact Guard/Touching assist     Transfers Chair/bed transfer  Transfers assist  Chair/bed transfer activity did  not occur: Safety/medical concerns  Chair/bed transfer assist level: Contact Guard/Touching assist Chair/bed transfer assistive device: Programmer, multimedia   Ambulation assist      Assist level: Contact Guard/Touching assist Assistive device: Walker-rolling Max distance: 150'   Walk 10 feet activity   Assist     Assist level: Contact Guard/Touching assist Assistive device: Walker-rolling   Walk 50 feet activity   Assist    Assist level: Contact Guard/Touching assist Assistive device: Walker-rolling    Walk 150 feet activity   Assist    Assist level: Contact Guard/Touching assist Assistive device: Walker-rolling    Walk  10 feet on uneven surface  activity   Assist Walk 10 feet on uneven surfaces activity did not occur: Safety/medical concerns         Wheelchair     Assist Is the patient using a wheelchair?: No             Wheelchair 50 feet with 2 turns activity    Assist            Wheelchair 150 feet activity     Assist          Blood pressure 115/70, pulse 82, temperature 98.2 F (36.8 C), temperature source Oral, resp. rate 18, height 6\' 4"  (1.93 m), weight 66.6 kg, SpO2 96 %.    Medical Problem List and Plan: 1. 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  Antithrombotics: -DVT/anticoagulation:  Mechanical: Sequential compression devices, below knee Bilateral lower extremities             -antiplatelet therapy: Off Plavix due to hematuria.  3. Pain Management: tylenol prn.  4. Mood: LCSW to follow for evaluation and support.              -antipsychotic agents: N/A  5. Neuropsych: This patient maybe intermittently capable of making decisions on his own behalf. 6. Skin/Wound Care: Routine pressure relief measures.  7. Fluids/Electrolytes/Nutrition:  Monitor I/O. Check CMET in am.  8. Prostate cancer/Urinary retention: Monitor for hematuria.              -On Proscar with ditropan for bladder spasms.  1/5 foley out and PVR's all less than 200cc. Continue with plan 9. T2DM: Hgb A 1C-  Monitor BS ac/hs. Use SSI for elevated BS.              --increased metformin to 500mg  BID CBG (last 3)  Recent Labs    11/11/21 1725 11/11/21 2125 11/12/21 0625  GLUCAP 123* 112* 105*     1/5- CBGs well controlled 10. Chronic combined systolic/diastolic CHF: Daily weights with low salt diet.  --Monitor for signs of overload.  Filed Weights   11/11/21 0500 11/12/21 0526  Weight: 64.5 kg 66.6 kg   -observe for further trend 11. Alzheimer's  dementia: Followed by Dr. Delice Lesch. Continue Namenda and Aricept             --Wellbutrin for anger management.              --used ativan prn for anxiety issues.  12. Underweight/cachexia-  BMI 17.98: encourage healthy diet.  13. Constipation  1/5 increase senokot to senna-s at bedtime 14.  Insomnia  1/4-  scheduled Trazodone 50 mg QHS nightly- --improved     LOS: 2 days A FACE TO FACE EVALUATION WAS PERFORMED  Meredith Staggers 11/12/2021, 10:19 AM

## 2021-11-12 NOTE — Progress Notes (Signed)
Physical Therapy Session Note  Patient Details  Name: TARO HIDROGO MRN: [id number] Date of Birth: 08-Sep-1946  Today's Date: 11/12/2021 PT Individual Time: 1122-1205 PT Individual Time Calculation (min): 43 min   Short Term Goals: Week 1:  PT Short Term Goal 1 (Week 1): = LTG due to ELOS  Skilled Therapeutic Interventions/Progress Updates: Pt presented in w/c agreeable to therapy. Pt denies pain but states some discomfort in R knee during session. No intervention requested, but rest breaks provided during session. Pt transported to day room and BP assess prior to standing activities. BP 84/63 (73) HR102. Pt participated in seated LE therex and UE activities including LAQ, seated marching, hip abd/add, ankle pumps and ball taps with 2lb dowel. All performed to fatigue ~12-15 bilaterally with pt performing x15 ball taps. BP checked again 86/59 (68) HR 103. PTA then donned ACE bandages with BP checked again 101/67 (76) HR 102. Pt then performed ambulatory transfer to high/low mat and participated in x 2 bouts of standing horseshoes. Pt initially c/o dizziness but eased off quickly. BP checked in standing after first bout 99/69 (79) HR 120. During standing bouts pt with RW, pt able to briefly release RW and adjust horseshoe with both hands. Pt with x 1 near LOB during activity but was able to correct. Pt required min cues for increased BOS as pt would adjust to near tandem stance unknowingly. Performed ambulatory transfer back to w/c with RW and CGA and transported back to room. Pt left in w/c at end of session with belt alarm on, call bell within reach and needs met.       Therapy Documentation Precautions:  Precautions Precautions: Fall Precaution Comments: pacemaker, dizzines/tinnitis Restrictions Weight Bearing Restrictions: No RLE Weight Bearing: Weight bearing as tolerated General:   Vital Signs:   Pain: Pain Assessment Pain Scale: 0-10 Pain Score: 0-No pain Mobility:    Locomotion :    Trunk/Postural Assessment :    Balance:   Exercises:   Other Treatments:      Therapy/Group: Individual Therapy  Fahd Galea 11/12/2021, 12:34 PM

## 2021-11-12 NOTE — Progress Notes (Signed)
Speech Language Pathology TBI Note  Patient Details  Name: Kevin [name] MRN: [id number] Date of Birth: 1945-11-16  Today's Date: 11/12/2021 SLP Individual Time: 5643-3295 SLP Individual Time Calculation (min): 45 min  Short Term Goals: Week 1: SLP Short Term Goal 1 (Week 1): Patient will utilize memory aides (calendar, daily log/memory book, etc) to orient to time and recall recent events with minA verbal and visual cues. SLP Short Term Goal 2 (Week 1): Patient will demonstrate awareness to errors during functional problem solving/reasoning tasks with modA verbal cues. SLP Short Term Goal 3 (Week 1): Patient will demonstrate adequate recall of learned strategies/precautions with modA verbal, visual cues. SLP Short Term Goal 4 (Week 1): Patient will demonstrate adequate selective attention when completing problem solving tasks in mildly distracting environment, with minA verbal cues to redirect.  Skilled Therapeutic Interventions: Skilled treatment session focused on cognitive goals. Upon arrival, patient was awake in bed with his breakfast tray sitting on the left side of his bed. Patient able to recall that he had not eaten breakfast yet this morning but unable to verbalize why. Patient agreeable to consuming his breakfast meal with SLP providing tray set-up. Patient required overall Min verbal cues for alternating attention between self-feeding and functional conversation that focused on recall and awareness. Patient verbalized appropriate awareness regarding current cognitive deficits and their overall impact on his recall of daily information. Patient received a phone call from his wife and demonstrated language of confusion with appropriate awareness of confusion and was easily redirected. Patient oriented to month, place and situation but required Min verbal cues for orientation to date and current age. Patient left upright in bed with alarm on and all needs within reach. Continue with  current plan of care.      Pain Sacrum pain, patient repositioned   Agitated Behavior Scale: TBI Observation Details Observation Environment: CIR Start of observation period - Date: 11/12/21 Start of observation period - Time: 0800 End of observation period - Date: 11/12/21 End of observation period - Time: 0845 Agitated Behavior Scale (DO NOT LEAVE BLANKS) Short attention span, easy distractibility, inability to concentrate: Absent Impulsive, impatient, low tolerance for pain or frustration: Absent Uncooperative, resistant to care, demanding: Absent Violent and/or threatening violence toward people or property: Absent Explosive and/or unpredictable anger: Absent Rocking, rubbing, moaning, or other self-stimulating behavior: Absent Pulling at tubes, restraints, etc.: Absent Wandering from treatment areas: Absent Restlessness, pacing, excessive movement: Absent Repetitive behaviors, motor, and/or verbal: Absent Rapid, loud, or excessive talking: Absent Sudden changes of mood: Absent Easily initiated or excessive crying and/or laughter: Absent Self-abusiveness, physical and/or verbal: Absent Agitated behavior scale total score: 14  Therapy/Group: Individual Therapy  Kenzlei Runions 11/12/2021, 8:55 AM

## 2021-11-12 NOTE — Progress Notes (Signed)
Patient ID: MAYNOR MWANGI, male   DOB: [birth year], 76 y.o.   MRN: [id number]  SW left message for [facility] [phone]: [phone]) to inform on ELOS and SW will follow-up after team conference with d/c date.   SW updated [place] on pt admission and wil f/u with d/c date.   SW met with pt in room to introduce self, explain role, and discuss discharge process. Pt provided Statement of service with ELOS 5-7 days. Pt aware SW will f/u with his wife.   1206-SW spoke with pt wife [name] [phone]) to introduce self, explain role., discuss discharge process, and inform on ELOS 5-7 days. SW informed will follow-up with updates after Team Conference. She asked if pt will require SNF placement. SW will ask therapy team thoughts if needed. She has indicated that she would pay for private care if needed since she works 33:30am-12pm M-F.  *[facility] to discuss above. Sw sent H&P as requested.   Therapy team reports pt is too advanced for SNF and a hired caregiver would be best for a few hours during the day.   Loralee Pacas, MSW, [place] Office: [phone] Cell: [phone] Fax: [phone]

## 2021-11-13 ENCOUNTER — Telehealth: Payer: Self-pay | Admitting: Pharmacist

## 2021-11-13 DIAGNOSIS — I5042 Chronic combined systolic (congestive) and diastolic (congestive) heart failure: Secondary | ICD-10-CM | POA: Diagnosis not present

## 2021-11-13 DIAGNOSIS — G301 Alzheimer's disease with late onset: Secondary | ICD-10-CM | POA: Diagnosis not present

## 2021-11-13 DIAGNOSIS — E119 Type 2 diabetes mellitus without complications: Secondary | ICD-10-CM | POA: Diagnosis not present

## 2021-11-13 DIAGNOSIS — S065XAA Traumatic subdural hemorrhage with loss of consciousness status unknown, initial encounter: Secondary | ICD-10-CM | POA: Diagnosis not present

## 2021-11-13 LAB — GLUCOSE, CAPILLARY
Glucose-Capillary: 84 mg/dL (ref 70–99)
Glucose-Capillary: 145 mg/dL — ABNORMAL HIGH (ref 70–99)
Glucose-Capillary: 119 mg/dL — ABNORMAL HIGH (ref 70–99)
Glucose-Capillary: 137 mg/dL — ABNORMAL HIGH (ref 70–99)

## 2021-11-13 MED ORDER — FINASTERIDE 5 MG PO TABS
5.0000 mg | ORAL_TABLET | Freq: Every day | ORAL | Status: DC
  Administered 2021-11-15 – 2021-11-19 (×5): 5 mg via ORAL
  Filled 2021-11-13 (×7): qty 1

## 2021-11-13 NOTE — Progress Notes (Signed)
Occupational Therapy TBI Note  Patient Details  Name: Kevin [name] MRN: [id number] Date of Birth: 1946/09/11  Today's Date: 11/13/2021 OT Individual Time: 2023-3435 OT Individual Time Calculation (min): 45 min  and Today's Date: 11/13/2021 OT Missed Time: 15 Minutes Missed Time Reason: Patient fatigue;Other (comment) (low BP-symptomatic)  Short Term Goals: Week 1:  OT Short Term Goal 1 (Week 1): STG= LTG d/t ELOS  Skilled Therapeutic Interventions/Progress Updates:    Pt greeted semi-reclined in bed, awake and agreeable to OT treatment session. Pt reported his back was itchy and agreeable to complete bathing/dressing tasks. Pt reported he did not recall feeling bad this morning. No complaint sof dizziness or headaches throughout session. BADL tasks complete from EOB with Min A overall and verbal cues for hand placement with sit<>stands as pt always tries to pull up on RW. Pt needed CGA for balance in standing. Pt with thigh-high TEDS and Ace wrap for BP management. Pt ambulated to the sink with RW and CGA with verbal cues for RW positioning at the sink. Pt requested to sit and completed toothbrushing task without cues. Pt then brought to therapy gym in wc for time management. Patient reported some dizziness and BP checked in sitting- 76/56. Thigh high TEDs and Ace wraps on. Pt not safe to stand or ambulate with low BP. Pt requested to return to bed and completed stand-pivot with CGA. Pt left semi-reclined in bed with needs met, bed alarm on. Will discuss with team getting abdominal binder added.  Therapy Documentation Precautions:  Precautions Precautions: Fall Precaution Comments: pacemaker, dizzines/tinnitis Restrictions Weight Bearing Restrictions: No RLE Weight Bearing: Weight bearing as tolerated Pain:  Denies pain Agitated Behavior Scale: TBI Observation Details Observation Environment: CIR Start of observation period - Date: 11/13/21 Start of observation period - Time:  1300 End of observation period - Date: 11/13/21 End of observation period - Time: 1400 Agitated Behavior Scale (DO NOT LEAVE BLANKS) Short attention span, easy distractibility, inability to concentrate: Absent Impulsive, impatient, low tolerance for pain or frustration: Absent Uncooperative, resistant to care, demanding: Absent Violent and/or threatening violence toward people or property: Absent Explosive and/or unpredictable anger: Absent Rocking, rubbing, moaning, or other self-stimulating behavior: Absent Pulling at tubes, restraints, etc.: Absent Wandering from treatment areas: Absent Restlessness, pacing, excessive movement: Absent Repetitive behaviors, motor, and/or verbal: Absent Rapid, loud, or excessive talking: Absent Sudden changes of mood: Absent Easily initiated or excessive crying and/or laughter: Absent Self-abusiveness, physical and/or verbal: Absent Agitated behavior scale total score: 14    Therapy/Group: Individual Therapy  Valma Cava 11/13/2021, 1:40 PM

## 2021-11-13 NOTE — Chronic Care Management (AMB) (Signed)
Chronic Care Management Pharmacy Assistant   Name: Kevin [name]  MRN: [id number] DOB: [birth year]  11/13/21 APPOINTMENT REMINDER   Called Patient No answer, left message of appointment on 11/17/21 at 11 via telephone visit with Jeni Salles, Pharm D.   Notified to have all medications, supplements, blood pressure and/or blood sugar logs available during appointment and to return call if need to reschedule.   Care Gaps: Hepatitis C Screening - Overdue Zoster Vaccine - Overdue Colonoscopy - Overdue COVID Booster #3 Therapist, music) - Overdue Flu Vaccine - Overdue CCM follow up  11/17/21 BP- 120/70 ( 09/10/21) Lab Results  Component Value Date   HGBA1C 6.7 (H) 10/22/2021    Star Rating Drug: Metformin 500 mg - Last filled 11/05/21 60 DS at [facility]  Any gaps in medications fill history? none    Medications: Facility-Administered Encounter Medications as of 11/13/2021  Medication   acetaminophen (TYLENOL) tablet 325-650 mg   alum & mag hydroxide-simeth (MAALOX/MYLANTA) 200-200-20 MG/5ML suspension 30 mL   atorvastatin (LIPITOR) tablet 40 mg   bisacodyl (DULCOLAX) suppository 10 mg   buPROPion (WELLBUTRIN XL) 24 hr tablet 150 mg   And   buPROPion (WELLBUTRIN XL) 24 hr tablet 150 mg   clobetasol ointment (TEMOVATE) 7.90 % 1 application   diphenhydrAMINE (BENADRYL) 12.5 MG/5ML elixir 12.5-25 mg   donepezil (ARICEPT) tablet 5 mg   finasteride (PROSCAR) tablet 5 mg   guaiFENesin-dextromethorphan (ROBITUSSIN DM) 100-10 MG/5ML syrup 5-10 mL   HYDROcodone-acetaminophen (NORCO/VICODIN) 5-325 MG per tablet 1 tablet   insulin aspart (novoLOG) injection 0-9 Units   lidocaine (XYLOCAINE) 2 % jelly   loratadine (CLARITIN) tablet 10 mg   magnesium gluconate (MAGONATE) tablet 250 mg   melatonin tablet 10 mg   memantine (NAMENDA) tablet 10 mg   metFORMIN (GLUCOPHAGE) tablet 500 mg   naphazoline-glycerin (CLEAR EYES REDNESS) ophth solution 1 drop   oxybutynin (DITROPAN) tablet 5 mg    polyethylene glycol (MIRALAX / GLYCOLAX) packet 17 g   prochlorperazine (COMPAZINE) tablet 5-10 mg   Or   prochlorperazine (COMPAZINE) injection 5-10 mg   Or   prochlorperazine (COMPAZINE) suppository 12.5 mg   protein supplement (ENSURE MAX) liquid   senna-docusate (Senokot-S) tablet 2 tablet   sodium phosphate (FLEET) 7-19 GM/118ML enema 1 enema   tetrahydrozoline 0.05 % ophthalmic solution 1 drop   traZODone (DESYREL) tablet 50 mg   Outpatient Encounter Medications as of 11/13/2021  Medication Sig Note   atorvastatin (LIPITOR) 80 MG tablet Take 0.5 tablets (40 mg total) by mouth every evening.    buPROPion (WELLBUTRIN XL) 150 MG 24 hr tablet Take 2 tablets in AM, 1 tablet in PM    Carboxymethylcellulose Sodium (EYE DROPS OP) Place 1 drop into both eyes 3 (three) times daily. Given by [facility] doctor to help astigmtism    cholecalciferol (VITAMIN D) 25 MCG (1000 UNIT) tablet Take 1 tablet (1,000 Units total) by mouth daily.    clobetasol ointment (TEMOVATE) 2.40 % Apply 1 application topically daily as needed (itching).     donepezil (ARICEPT) 5 MG tablet Take 1 tablet (5 mg total) by mouth daily.    finasteride (PROSCAR) 5 MG tablet Take 1 tablet (5 mg total) by mouth daily.    HYDROcodone-acetaminophen (NORCO/VICODIN) 5-325 MG tablet Take 1-2 tablets by mouth every 4 (four) hours as needed for moderate pain.    LORazepam (ATIVAN) 0.5 MG tablet Take 1 tablet (0.5 mg total) by mouth every 8 (eight) hours as needed for anxiety.  melatonin 10 MG TABS Take 10 mg by mouth at bedtime as needed (insomnia).    memantine (NAMENDA) 10 MG tablet Take 1 tablet twice a day (Patient taking differently: Take 10 mg by mouth 2 (two) times daily.)    metFORMIN (GLUCOPHAGE) 500 MG tablet Take 0.5 tablets (250 mg total) by mouth 2 (two) times daily with a meal. (Patient taking differently: Take 500 mg by mouth daily.)    oxybutynin (DITROPAN) 5 MG tablet Take 1 tablet (5 mg total) by mouth 2 (two) times daily.     polyethylene glycol (MIRALAX / GLYCOLAX) 17 g packet Take 17 g by mouth 2 (two) times daily. 11/05/2021: Hasn't started.   sodium chloride (OCEAN) 0.65 % SOLN nasal spray Place 1 spray into both nostrils as needed for congestion. 11/05/2021: Hasn't started.       [place] Clinical Pharmacist Assistant [phone]

## 2021-11-13 NOTE — Progress Notes (Addendum)
Speech Language Pathology TBI Note  Patient Details  Name: Kevin [name] MRN: [id number] Date of Birth: 1946-08-02  Today's Date: 11/13/2021 SLP Individual Time: 0930-1000 SLP Individual Time Calculation (min): 30 min and Today's Date: 11/13/2021 SLP Missed Time: 30 Minutes Missed Time Reason: Patient ill (Comment)  Short Term Goals: Week 1: SLP Short Term Goal 1 (Week 1): Patient will utilize memory aides (calendar, daily log/memory book, etc) to orient to time and recall recent events with minA verbal and visual cues. SLP Short Term Goal 2 (Week 1): Patient will demonstrate awareness to errors during functional problem solving/reasoning tasks with modA verbal cues. SLP Short Term Goal 3 (Week 1): Patient will demonstrate adequate recall of learned strategies/precautions with modA verbal, visual cues. SLP Short Term Goal 4 (Week 1): Patient will demonstrate adequate selective attention when completing problem solving tasks in mildly distracting environment, with minA verbal cues to redirect.  Skilled Therapeutic Interventions: Skilled treatment session focused on cognitive goals. Upon arrival, patient was awake in the recliner and appeared lethargic. Patient reported he felt dizzy with a headache of his right eye. BP taken: 108/71 with TED hoes and ace wraps in place. RN aware.  Patient unable to participate due to not feeling well and requested to sit up in the recliner. SLP checked on the patient ~10 minutes later and patient reporting ongoing dizziness and requested to get back into bed. Patient transferred to the bed with the RW. Patient left with alarm on and all needs within reach. Continue with current plan of care.      Pain Headache, RN aware   Agitated Behavior Scale: TBI Observation Details Observation Environment: Patient's room Start of observation period - Date: 11/13/21 Start of observation period - Time: 0930 End of observation period - Date: 11/13/21 End of  observation period - Time: 1000 Agitated Behavior Scale (DO NOT LEAVE BLANKS) Short attention span, easy distractibility, inability to concentrate: Absent Impulsive, impatient, low tolerance for pain or frustration: Absent Uncooperative, resistant to care, demanding: Absent Violent and/or threatening violence toward people or property: Absent Explosive and/or unpredictable anger: Absent Rocking, rubbing, moaning, or other self-stimulating behavior: Absent Pulling at tubes, restraints, etc.: Absent Wandering from treatment areas: Absent Restlessness, pacing, excessive movement: Absent Repetitive behaviors, motor, and/or verbal: Absent Rapid, loud, or excessive talking: Absent Sudden changes of mood: Absent Easily initiated or excessive crying and/or laughter: Absent Self-abusiveness, physical and/or verbal: Absent Agitated behavior scale total score: 14  Therapy/Group: Individual Therapy  Whitt Auletta 11/13/2021, 3:27 PM

## 2021-11-13 NOTE — IPOC Note (Signed)
Overall Plan of Care [facility]) Patient Details Name: Kevin [name] MRN: [id number] DOB: 01-10-1946  Admitting Diagnosis: SDH (subdural hematoma)  Hospital Problems: Principal Problem:   SDH (subdural hematoma)     Functional Problem List: Nursing Bowel, Bladder, Medication Management, Safety, Endurance  PT Balance, Endurance, Safety, Other (comment) (dizziness/orthostatic hypotension)  OT Balance, Cognition, Endurance, Motor, Safety  SLP Cognition, Safety, Perception  TR         Basic ADLs: OT Grooming, Dressing, Bathing, Toileting     Advanced  ADLs: OT       Transfers: PT Bed Mobility, Bed to Chair, Car, Furniture, Floor  OT Toilet, Tub/Shower     Locomotion: PT Ambulation, Emergency planning/management officer, Stairs     Additional Impairments: OT None  SLP Social Cognition   Problem Solving, Memory, Attention, Awareness  TR      Anticipated Outcomes Item Anticipated Outcome  Self Feeding no goal  Swallowing  N/A   Basic self-care  (S)  Toileting  (S)   Bathroom Transfers (S)  Bowel/Bladder  manage bowel w mod I and bladder with min assist  Transfers     Locomotion  Supervision at ambulatory level  Communication  N/A  Cognition  supervision to minA  Pain  Pain at or below level 4 with prn meds  Safety/Judgment  Manage safety with supervision/cues   Therapy Plan: PT Intensity: Minimum of 1-2 x/day ,45 to 90 minutes PT Frequency: 5 out of 7 days PT Duration Estimated Length of Stay: 5-7 days OT Intensity: Minimum of 1-2 x/day, 45 to 90 minutes OT Frequency: 5 out of 7 days OT Duration/Estimated Length of Stay: 5-7 days SLP Intensity: Minumum of 1-2 x/day, 30 to 90 minutes SLP Frequency: 3 to 5 out of 7 days SLP Duration/Estimated Length of Stay: 2 weeks   Due to the current state of emergency, patients may not be receiving their 3-hours of Medicare-mandated therapy.   Team Interventions: Nursing Interventions Bladder Management, Disease  Management/Prevention, Medication Management, Discharge Planning, Bowel Management, Patient/Family Education, Pain Management  PT interventions Ambulation/gait training, Training and development officer, Cognitive remediation/compensation, Community reintegration, Discharge planning, Disease management/prevention, DME/adaptive equipment instruction, Functional mobility training, Neuromuscular re-education, Pain management, Patient/family education, Psychosocial support, Stair training, Therapeutic Activities, Therapeutic Exercise, UE/LE Strength taining/ROM, UE/LE Coordination activities  OT Interventions Balance/vestibular training, Disease mangement/prevention, Self Care/advanced ADL retraining, Therapeutic Exercise, UE/LE Strength taining/ROM, Pain management, DME/adaptive equipment instruction, Cognitive remediation/compensation, Community reintegration, Barrister's clerk education, UE/LE Coordination activities, Therapeutic Activities, Psychosocial support, Functional mobility training, Discharge planning  SLP Interventions Cognitive remediation/compensation, Internal/external aids, Patient/family education, Functional tasks, Environmental controls  TR Interventions    SW/CM Interventions Discharge Planning, Psychosocial Support, Patient/Family Education   Barriers to Discharge MD  Medical stability  Nursing Decreased caregiver support, Home environment access/layout 2 level 4ste, bil rails, main B+B. Spouse works part time and only able to provide intermittent supervision  PT Decreased caregiver support    OT      SLP Decreased caregiver support patient's wife works part time as Pharmacist, hospital at a [employer]  SW       Team Discharge Planning: Destination: PT-Home ,OT- Home , SLP-Home Projected Follow-up: PT-24 hour supervision/assistance, Outpatient PT, OT-  Home health OT, SLP-None, 24 hour supervision/assistance Projected Equipment Needs: PT-None recommended by PT (already owns RW), OT- To be  determined, SLP-None recommended by SLP Equipment Details: PT- , OT-  Patient/family involved in discharge planning: PT- Patient,  OT-Patient, SLP-Patient  MD ELOS: 5-7 days Medical Rehab Prognosis:  Excellent Assessment: The  patient has been admitted for CIR therapies with the diagnosis of SDH after fall. The team will be addressing functional mobility, strength, stamina, balance, safety, adaptive techniques and equipment, self-care, bowel and bladder mgt, patient and caregiver education, NMR, cognition, communication. Goals have been set at supervision for self-care and mobility.   Due to the current state of emergency, patients may not be receiving their 3 hours per day of Medicare-mandated therapy.    Meredith Staggers, MD, FAAPMR     See Team Conference Notes for weekly updates to the plan of care

## 2021-11-13 NOTE — Progress Notes (Addendum)
PROGRESS NOTE   Subjective/Complaints:  Pt eating breakfast. "It's cold".  No new issues overnight. Still retaining urine. Typically not requiring caths right now but was cathed for 300cc overnight.   ROS: Patient denies fever, rash, sore throat, blurred vision, nausea, vomiting, diarrhea, cough, shortness of breath or chest pain, joint or back pain, headache, or mood change.   Objective:   No results found. Recent Labs    11/11/21 0527  WBC 8.7  HGB 13.6  HCT 41.8  PLT 208   Recent Labs    11/11/21 0527  NA 139  K 4.5  CL 104  CO2 28  GLUCOSE 140*  BUN 21  CREATININE 1.24  CALCIUM 9.1    Intake/Output Summary (Last 24 hours) at 11/13/2021 0856 Last data filed at 11/13/2021 0146 Gross per 24 hour  Intake 200 ml  Output 500 ml  Net -300 ml        Physical Exam: Vital Signs Blood pressure 113/67, pulse 85, temperature 98.3 F (36.8 C), temperature source Oral, resp. rate 18, height 6\' 4"  (1.93 m), weight 66.3 kg, SpO2 97 %.   Constitutional: No distress . Vital signs reviewed. HEENT: NCAT, EOMI, oral membranes moist Neck: supple Cardiovascular: RRR without murmur. No JVD    Respiratory/Chest: CTA Bilaterally without wheezes or rales. Normal effort    GI/Abdomen: BS +, non-tender, non-distended Ext: no clubbing, cyanosis, or edema Psych: pleasant and cooperative  Skin:    Comments: Lesion on right scalp (due to agent orange exposure--stable) also has a lot of ?Neurofibromas vs large freckles/moles everywhere Neurological:     Mental Status: awake and alert     Comments: Dysarthric speech. Able answer basic orientation questions without difficulty, fair insight and awareness. Stm deficits.    Cranial nerves II through XII intact, 5/5 strength in bilateral upper extremities.  3/5 RIght lower ext prox to 4/5 distal, 5/5 LLE. -stable  Assessment/Plan: 1. Functional deficits which require 3+ hours per day of  interdisciplinary therapy in a comprehensive inpatient rehab setting. Physiatrist is providing close team supervision and 24 hour management of active medical problems listed below. Physiatrist and rehab team continue to assess barriers to discharge/monitor patient progress toward functional and medical goals  Care Tool:  Bathing  Bathing activity did not occur: Safety/medical concerns Body parts bathed by patient: Right arm, Right lower leg, Left arm, Left lower leg, Chest, Abdomen, Face, Front perineal area, Buttocks, Right upper leg, Left upper leg         Bathing assist Assist Level: Minimal Assistance - Patient > 75%     Upper Body Dressing/Undressing Upper body dressing Upper body dressing/undressing activity did not occur (including orthotics): Safety/medical concerns What is the patient wearing?: Pull over shirt    Upper body assist Assist Level: Set up assist    Lower Body Dressing/Undressing Lower body dressing      What is the patient wearing?: Pants     Lower body assist Assist for lower body dressing: Set up assist     Toileting Toileting    Toileting assist Assist for toileting: Contact Guard/Touching assist     Transfers Chair/bed transfer  Transfers assist  Chair/bed transfer activity  did not occur: Safety/medical concerns  Chair/bed transfer assist level: Contact Guard/Touching assist Chair/bed transfer assistive device: Programmer, multimedia   Ambulation assist      Assist level: Contact Guard/Touching assist Assistive device: Walker-rolling Max distance: 150'   Walk 10 feet activity   Assist     Assist level: Contact Guard/Touching assist Assistive device: Walker-rolling   Walk 50 feet activity   Assist    Assist level: Contact Guard/Touching assist Assistive device: Walker-rolling    Walk 150 feet activity   Assist    Assist level: Contact Guard/Touching assist Assistive device: Walker-rolling    Walk  10 feet on uneven surface  activity   Assist Walk 10 feet on uneven surfaces activity did not occur: Safety/medical concerns         Wheelchair     Assist Is the patient using a wheelchair?: No             Wheelchair 50 feet with 2 turns activity    Assist            Wheelchair 150 feet activity     Assist          Blood pressure 113/67, pulse 85, temperature 98.3 F (36.8 C), temperature source Oral, resp. rate 18, height 6\' 4"  (1.93 m), weight 66.3 kg, SpO2 97 %.    Medical Problem List and Plan: 1. Functional deficits secondary to right SDH after fall             -patient may shower             -ELOS/Goals: 10-14 days S         -Continue CIR therapies including PT, OT, and SLP  2.  Antithrombotics: -DVT/anticoagulation:  Mechanical: Sequential compression devices, below knee Bilateral lower extremities             -antiplatelet therapy: Off Plavix due to hematuria.  3. Pain Management: tylenol prn.  4. Mood: LCSW to follow for evaluation and support.              -antipsychotic agents: N/A  5. Neuropsych: This patient maybe intermittently capable of making decisions on his own behalf. 6. Skin/Wound Care: Routine pressure relief measures.  7. Fluids/Electrolytes/Nutrition:  Monitor I/O. Check CMET in am.  8. Prostate cancer/Urinary retention: Monitor for hematuria.              -On Proscar with ditropan for bladder spasms.  1/6 foley out and PVR's have typically been less than 300cc  -I/O cath if needed  -stand to void when possible 9. T2DM: Hgb A 1C-  Monitor BS ac/hs. Use SSI for elevated BS.              --increased metformin to 500mg  BID CBG (last 3)  Recent Labs    11/12/21 1648 11/12/21 2145 11/13/21 0609  GLUCAP 134* 119* 84     1/6- CBGs well controlled 10. Chronic combined systolic/diastolic CHF: Daily weights with low salt diet.  --Monitor for signs of overload.  Filed Weights   11/11/21 0500 11/12/21 0526 11/13/21 0419   Weight: 64.5 kg 66.6 kg 66.3 kg   -1/6 monitor for upward trend  11. Alzheimer's dementia: Followed by Dr. Delice Lesch. Continue Namenda and Aricept             --Wellbutrin for anger management.              --used ativan prn for anxiety issues.  12. Underweight/cachexia-  BMI  17.98: encourage healthy diet.  13. Constipation. Last bm 1/2  1/6 give sorbitol today, SSE if needed 14. Insomnia  1/4-  scheduled Trazodone 50 mg QHS nightly- --improved    Greater than 35 total minutes was spent in examination of patient, assessment of pertinent data,  formulation of a treatment plan, and in discussion with patient and/or family.    LOS: 3 days A FACE TO FACE EVALUATION WAS PERFORMED  Meredith Staggers 11/13/2021, 8:56 AM

## 2021-11-13 NOTE — Progress Notes (Signed)
Physical Therapy Session Note  Patient Details  Name: Kevin [name] MRN: [id number] Date of Birth: Mar 26, 1946  Today's Date: 11/13/2021 PT Missed Time: 40 Minutes Missed Time Reason: Patient fatigue  Short Term Goals: Week 1:  PT Short Term Goal 1 (Week 1): = LTG due to ELOS  Skilled Therapeutic Interventions/Progress Updates:     PT spoke with SLP prior to attempted session. SLP reports she had ended session early due to pt feeling very dizzy as well as reporting pain above eyes. PT checks on pt and he is asleep in room. Left sleeping. PT will follow up as able.  Therapy Documentation Precautions:  Precautions Precautions: Fall Precaution Comments: pacemaker, dizzines/tinnitis Restrictions Weight Bearing Restrictions: No RLE Weight Bearing: Weight bearing as tolerated General: PT Amount of Missed Time (min): 60 Minutes PT Missed Treatment Reason: Patient fatigue    Therapy/Group: Individual Therapy  Breck Coons, PT, DPT 11/13/2021, 11:51 AM

## 2021-11-13 NOTE — Progress Notes (Signed)
Pt having difficulty voiding. Pt states he "feels it coming" but able able to start the stream. Bladder scan reading 333ml/ Pt agreed to in/out cath

## 2021-11-13 NOTE — Plan of Care (Signed)
Behavioral Plan   [place] Level: VIII  Behavior to decrease/ eliminate:  No behaviors    Changes to environment:  Bed alram Chair alarm Call bell within reach Cell phone within reach  Interventions: Vestibular eval TED hose, Ace wraps, abdominal binder for symptomatic orthostatic hypotension Encourage fluid intake- ON PRIOR TO GETTING OOB   Recommendations for interactions with patient: Ask if basic needs are met prior to leaving room Ask is patient is dizzy prior to getting [place] patient    Attendees:  Shelby Mattocks, DPT Weston Anna, SLP Cherylynn Ridges, OTR/L Dorthula Nettles, RN/3

## 2021-11-13 NOTE — Progress Notes (Signed)
Occupational Therapy Session Note  Patient Details  Name: Kevin [name] MRN: [id number] Date of Birth: November 01, 1946  Today's Date: 11/14/2021 OT Individual Time: 1005-1102 OT Individual Time Calculation (min): 57 min    Short Term Goals: Week 1:  OT Short Term Goal 1 (Week 1): STG= LTG d/t ELOS  Skilled Therapeutic Interventions/Progress Updates:    Pt greeted in bed with no c/o pain. Agreeable to engage in ADL activity at start of session. Supine<sit completed with setup assistance, HOB raised pt using the bedrail. UB bathing/dressing completed with setup assistance. Pt had some posterior/Rt lateral LOBs when washing lower legs but able to meet task demands while recovering his balance. He did not want to complete pericare or change his pants today. Assistance needed for donning Teds + ACE wraps and gripper socks. His abdominal binder was not in the room, informed RN to order another. BP while sitting 108/62. Pt stood using RW with close supervision ~1 minute and BP reassessed, reading: 88/70. Pt with "moderate" dizziness. Informed RN of BP readings. Pt reported feeling well enough to transfer to the recliner. CGA for short distance ambulation using RW with 2nd helper present for safety. Pts legs were elevated once he was sitting again. BP after a minute in sitting 107/79. Pt made comfortable, Min A to comb hair. He remained sitting up with all needs within reach and chair alarm set. Tx focus placed on activity tolerance, dynamic balance, sit<stand, and transfers.   Therapy Documentation Precautions:  Precautions Precautions: Fall Precaution Comments: pacemaker, dizzines/tinnitis Restrictions Weight Bearing Restrictions: No RLE Weight Bearing: Weight bearing as tolerated Vital Signs: Therapy Vitals Temp: 97.9 F (36.6 C) Temp Source: Oral Pulse Rate: 86 Resp: 18 BP: 106/72 Patient Position (if appropriate): Lying Oxygen Therapy SpO2: 97 % O2 Device: Room Air ADL: ADL Eating:  Supervision/safety Where Assessed-Eating: Wheelchair Grooming: Supervision/safety Where Assessed-Grooming: Clinical biochemist Bathing: Supervision/safety Where Assessed-Upper Body Bathing: Wheelchair Lower Body Bathing: Minimal assistance Where Assessed-Lower Body Bathing: Wheelchair Upper Body Dressing: Supervision/safety Where Assessed-Upper Body Dressing: Wheelchair Lower Body Dressing: Minimal assistance Where Assessed-Lower Body Dressing: Wheelchair Toileting: Minimal assistance Where Assessed-Toileting: Glass blower/designer: Psychiatric nurse Method: Counselling psychologist: Raised toilet seat, Grab bars Tub/Shower Transfer: Unable to assess Tub/Shower Transfer Method: Unable to assess   Therapy/Group: Individual Therapy  Tashawn Laswell A Ebenezer Mccaskey 11/14/2021, 12:48 PM

## 2021-11-13 NOTE — Progress Notes (Signed)
Physical Therapy TBI Note  Patient Details  Name: Kevin [name] MRN: [id number] Date of Birth: 1946-02-16  Today's Date: 11/13/2021 PT Individual Time: 2876-8115 PT Individual Time Calculation (min): 32 min   Short Term Goals: Week 1:  PT Short Term Goal 1 (Week 1): = LTG due to ELOS  Skilled Therapeutic Interventions/Progress Updates:    Patient received sitting up in recliner, agreeable to unscheduled make up therapy time. He denies pain. Patient ambulating into bathroom with RW and CGA. Slow gait speed with flexed posture noted. Patient attempting to void, but unable to. Patient able to stand up from low toilet with MinA and complete clothing management in standing with supervision. Patient ambulating back to recliner with RW and CGA. He states that his bladder feels full, but that he's unable to void- requesting RN/NT to scan and possibly cath him. PT alerting RN to this.   Therapy Documentation Precautions:  Precautions Precautions: Fall Precaution Comments: pacemaker, dizzines/tinnitis Restrictions Weight Bearing Restrictions: No RLE Weight Bearing: Weight bearing as tolerated  Agitated Behavior Scale: TBI  Agitated Behavior Scale (DO NOT LEAVE BLANKS) Short attention span, easy distractibility, inability to concentrate: Absent Impulsive, impatient, low tolerance for pain or frustration: Absent Uncooperative, resistant to care, demanding: Absent Violent and/or threatening violence toward people or property: Absent Explosive and/or unpredictable anger: Absent Rocking, rubbing, moaning, or other self-stimulating behavior: Absent Pulling at tubes, restraints, etc.: Absent Wandering from treatment areas: Absent Restlessness, pacing, excessive movement: Absent Repetitive behaviors, motor, and/or verbal: Absent Rapid, loud, or excessive talking: Absent Sudden changes of mood: Absent Easily initiated or excessive crying and/or laughter: Absent Self-abusiveness, physical  and/or verbal: Absent Agitated behavior scale total score: 14      Therapy/Group: Individual Therapy  Karoline Caldwell, PT, DPT, CBIS  11/13/2021, 3:29 PM

## 2021-11-13 NOTE — Progress Notes (Signed)
Physical Therapy Session Note  Patient Details  Name: Kevin [name] MRN: [id number] Date of Birth: [birth year]  Today's Date: 11/13/2021 PT Individual Time: 0905-0930 PT Individual Time Calculation (min): 25 min   Short Term Goals: Week 1:  PT Short Term Goal 1 (Week 1): = LTG due to ELOS  Skilled Therapeutic Interventions/Progress Updates: Pt presented in recliner agreeable to therapy. Pt denies pain during session and no pain behaviors noted. PTA donned TED hose and ace bandages total A for time management. BP assessed 99/65 (76) HR 96. PTA provided minA for threading pants with pt performing Sit to stand with CGA and RW to pull pants over hips. Pt indicated increased dizziness as PTA was placing "belt" (theraband). Pt returned to sitting and BP checked 103/65 (77) HR 94. Pt stating symptoms resolving with ~2 min rest. Pt agreeable to ambulation as not orthostatic. Pt ambulated to nsg station and around dayroom ~22f with RW and CGA. Pt demonstrating fair cadence and narrow BOS. Pt with x1 near LOB when returning to room as pt somehow crossed legs. Pt was able to self correct with PTA heavily guarding. Pt returned to recliner and BP checked 110/70 (82) HR 95. Pt left in recliner at end of session with seat alarm on, call bell within reach and needs met.      Therapy Documentation Precautions:  Precautions Precautions: Fall Precaution Comments: pacemaker, dizzines/tinnitis Restrictions Weight Bearing Restrictions: No RLE Weight Bearing: Weight bearing as tolerated General:   Vital Signs: Therapy Vitals Temp: 98.2 F (36.8 C) Temp Source: Oral Pulse Rate: 90 Resp: 20 BP: 109/68 Patient Position (if appropriate): Lying Oxygen Therapy SpO2: 96 % O2 Device: Room Air Pain: Pain Assessment Pain Scale: 0-10 Pain Score: 7  Pain Type: Acute pain Pain Location: Head Pain Orientation: Right Pain Descriptors / Indicators: Headache Pain Frequency: Intermittent Pain Onset:  On-going Patients Stated Pain Goal: 2 Pain Intervention(s): Medication (See eMAR) Multiple Pain Sites: No Mobility:   Locomotion :    Trunk/Postural Assessment :    Balance:   Exercises:   Other Treatments:      Therapy/Group: Individual Therapy  Colum Colt 11/13/2021, 4:20 PM

## 2021-11-14 DIAGNOSIS — S065XAA Traumatic subdural hemorrhage with loss of consciousness status unknown, initial encounter: Secondary | ICD-10-CM | POA: Diagnosis not present

## 2021-11-14 LAB — BASIC METABOLIC PANEL
Anion gap: 8 (ref 5–15)
BUN: 24 mg/dL — ABNORMAL HIGH (ref 8–23)
CO2: 26 mmol/L (ref 22–32)
Calcium: 9 mg/dL (ref 8.9–10.3)
Chloride: 102 mmol/L (ref 98–111)
Creatinine, Ser: 1.31 mg/dL — ABNORMAL HIGH (ref 0.61–1.24)
GFR, Estimated: 57 mL/min — ABNORMAL LOW (ref >60)
Glucose, Bld: 128 mg/dL — ABNORMAL HIGH (ref 70–99)
Potassium: 4.5 mmol/L (ref 3.5–5.1)
Sodium: 136 mmol/L (ref 135–145)

## 2021-11-14 LAB — GLUCOSE, CAPILLARY
Glucose-Capillary: 105 mg/dL — ABNORMAL HIGH (ref 70–99)
Glucose-Capillary: 116 mg/dL — ABNORMAL HIGH (ref 70–99)
Glucose-Capillary: 89 mg/dL (ref 70–99)
Glucose-Capillary: 78 mg/dL (ref 70–99)

## 2021-11-14 MED ORDER — SODIUM CHLORIDE 0.9 % IV SOLN: INTRAVENOUS | Status: AC

## 2021-11-14 NOTE — Progress Notes (Signed)
PROGRESS NOTE   Subjective/Complaints:  Pt reports no pain and slept OK, LBM last night, however has had drops in BP to 80s/50s with sitting up- and required reverse Trendelenburg. Will start IVFs 75cc/hour x 24 hours since new BUN/Cr show pt's becoming more dry   ROS:  Pt denies SOB, abd pain, CP, N/V/C/D, and vision changes  Objective:   No results found. No results for input(s): WBC, HGB, HCT, PLT in the last 72 hours.  Recent Labs    11/14/21 0952  NA 136  K 4.5  CL 102  CO2 26  GLUCOSE 128*  BUN 24*  CREATININE 1.31*  CALCIUM 9.0    Intake/Output Summary (Last 24 hours) at 11/14/2021 1419 Last data filed at 11/14/2021 1300 Gross per 24 hour  Intake 407.38 ml  Output 992 ml  Net -584.62 ml        Physical Exam: Vital Signs Blood pressure 106/72, pulse 86, temperature 97.9 F (36.6 C), temperature source Oral, resp. rate 18, height 6\' 4"  (1.93 m), weight 62.9 kg, SpO2 97 %.    General: awake, alert, appropriate, sitting up slightly in bed; looks pale; NAD HENT: conjugate gaze; oropharynx moist CV: regular rate; no JVD Pulmonary: CTA B/L; no W/R/R- good air movement GI: soft, NT, ND, (+)BS Psychiatric: appropriate Neurological: alert Tenting (+)- IV L wrist looks OK Ext: no clubbing, cyanosis, or edema Psych: pleasant and cooperative  Skin:    Comments: Lesion on right scalp (due to agent orange exposure--stable) also has a lot of ?Neurofibromas vs large freckles/moles everywhere Neurological:     Mental Status: awake and alert     Comments: Dysarthric speech. Able answer basic orientation questions without difficulty, fair insight and awareness. Stm deficits.    Cranial nerves II through XII intact, 5/5 strength in bilateral upper extremities.  3/5 RIght lower ext prox to 4/5 distal, 5/5 LLE. -stable  Assessment/Plan: 1. Functional deficits which require 3+ hours per day of interdisciplinary therapy  in a comprehensive inpatient rehab setting. Physiatrist is providing close team supervision and 24 hour management of active medical problems listed below. Physiatrist and rehab team continue to assess barriers to discharge/monitor patient progress toward functional and medical goals  Care Tool:  Bathing  Bathing activity did not occur: Safety/medical concerns Body parts bathed by patient: Right arm, Right lower leg, Left arm, Left lower leg, Chest, Abdomen, Face, Front perineal area, Buttocks, Right upper leg, Left upper leg         Bathing assist Assist Level: Minimal Assistance - Patient > 75%     Upper Body Dressing/Undressing Upper body dressing Upper body dressing/undressing activity did not occur (including orthotics): Safety/medical concerns What is the patient wearing?: Pull over shirt    Upper body assist Assist Level: Set up assist    Lower Body Dressing/Undressing Lower body dressing      What is the patient wearing?: Pants     Lower body assist Assist for lower body dressing: Set up assist     Toileting Toileting    Toileting assist Assist for toileting: Contact Guard/Touching assist     Transfers Chair/bed transfer  Transfers assist  Chair/bed transfer activity did not  occur: Safety/medical concerns  Chair/bed transfer assist level: Contact Guard/Touching assist Chair/bed transfer assistive device: Programmer, multimedia   Ambulation assist      Assist level: Contact Guard/Touching assist Assistive device: Walker-rolling Max distance: 150'   Walk 10 feet activity   Assist     Assist level: Contact Guard/Touching assist Assistive device: Walker-rolling   Walk 50 feet activity   Assist    Assist level: Contact Guard/Touching assist Assistive device: Walker-rolling    Walk 150 feet activity   Assist    Assist level: Contact Guard/Touching assist Assistive device: Walker-rolling    Walk 10 feet on uneven surface   activity   Assist Walk 10 feet on uneven surfaces activity did not occur: Safety/medical concerns         Wheelchair     Assist Is the patient using a wheelchair?: No             Wheelchair 50 feet with 2 turns activity    Assist            Wheelchair 150 feet activity     Assist          Blood pressure 106/72, pulse 86, temperature 97.9 F (36.6 C), temperature source Oral, resp. rate 18, height 6\' 4"  (1.93 m), weight 62.9 kg, SpO2 97 %.    Medical Problem List and Plan: 1. Functional deficits secondary to right SDH after fall             -patient may shower             -ELOS/Goals: 10-14 days S         -Continue CIR- PT, OT and SLP 2.  Antithrombotics: -DVT/anticoagulation:  Mechanical: Sequential compression devices, below knee Bilateral lower extremities             -antiplatelet therapy: Off Plavix due to hematuria.  3. Pain Management: tylenol prn.  4. Mood: LCSW to follow for evaluation and support.              -antipsychotic agents: N/A  5. Neuropsych: This patient maybe intermittently capable of making decisions on his own behalf. 6. Skin/Wound Care: Routine pressure relief measures.  7. Fluids/Electrolytes/Nutrition:  Monitor I/O. Check CMET in am.  8. Prostate cancer/Urinary retention: Monitor for hematuria.              -On Proscar with ditropan for bladder spasms.  1/6 foley out and PVR's have typically been less than 300cc  -I/O cath if needed  -stand to void when possible 1/7- could be due to low input- not drinking- will have pt stand to void.  9. T2DM: Hgb A 1C-  Monitor BS ac/hs. Use SSI for elevated BS.              --increased metformin to 500mg  BID CBG (last 3)  Recent Labs    11/13/21 2152 11/14/21 0632 11/14/21 1145  GLUCAP 137* 105* 116*     1/7- Bgs controlled- co't regimen 10. Chronic combined systolic/diastolic CHF: Daily weights with low salt diet.  --Monitor for signs of overload.  Filed Weights    11/12/21 0526 11/13/21 0419 11/14/21 0500  Weight: 66.6 kg 66.3 kg 62.9 kg   -1/6 monitor for upward trend  1/7- Weight down 4kg- not sure if right or due to not drinking- will monitor 11. Alzheimer's dementia: Followed by Dr. Delice Lesch. Continue Namenda and Aricept             --Wellbutrin  for anger management.              --used ativan prn for anxiety issues.  12. Underweight/cachexia-  BMI 17.98: encourage healthy diet.  13. Constipation. Last bm 1/2  1/6 give sorbitol today, SSE if needed 14. Insomnia  1/4-  scheduled Trazodone 50 mg QHS nightly- --improved 15. Hypotension/near syncope  1/7- BUN/Cr elevated and BP 80s/50- symptomatic- will give IVFs 75 cc hour x 24 and recheck labs in AM.    I spent a total of 41 minutes on total care today- >50% coordination of care- d/w nursing 4x today     LOS: 4 days A FACE TO FACE EVALUATION WAS PERFORMED  Charan Prieto 11/14/2021, 2:19 PM

## 2021-11-14 NOTE — Progress Notes (Signed)
Physical Therapy TBI Note  Patient Details  Name: Kevin [name] MRN: [id number] Date of Birth: Jan 28, 1946  Today's Date: 11/14/2021 PT Individual Time: 9741-6384; 5364-6803 PT Individual Time Calculation (min): 16 min  and 15 mins and Today's Date: 11/14/2021 PT Missed Time: 14 Minutes; 15 mins Missed Time Reason: Patient fatigue;Other (Comment) (low BP)  Short Term Goals: Week 1:  PT Short Term Goal 1 (Week 1): = LTG due to ELOS  Skilled Therapeutic Interventions/Progress Updates:    Session 1: Patient received sitting up at edge of bed, eating breakfast, bed alarm not on. He was agreeable to PT. He denied pain. PT preparing environment for patient to stand up and begin participating when patient reported feeling "very light headed." BP assessed in sitting: 80/49 with thigh high TEDs on. No abdominal binder found in room. Patient returning supine with supervision. BP supine: 118/67 with reports of light headedness resolving. Patient requesting to remain in bed due to fatigue and not feeling well upon sitting up. RN alerted to patients BP in sitting and that patient was found EOB with no alarm on. Patient left supine in bed, alarm on, call light within reach.   Session 2: Patient received supine in bed, RN and NT at bedside I&O cathing patient. Upon PT return, patient asleep and difficult to wake. PT attempting gentle multimodal techniques to help rouse patient. Patient waking up very briefly. PT attempting to encourage patient to participate in OOB therapy- patient not agreeable. PT attempting to encourage patient to participate in in- bed activities, educating patient on importance of mobility to help support BP. Patient continued to fall asleep. Patient remaining in bed, bed alarm on, call light within reach.   Therapy Documentation Precautions:  Precautions Precautions: Fall Precaution Comments: pacemaker, dizzines/tinnitis Restrictions Weight Bearing Restrictions: No RLE Weight  Bearing: Weight bearing as tolerated  Vital Signs: Therapy Vitals BP: (!) 80/49 Patient Position (if appropriate): Sitting Pain: Pain Assessment Pain Scale: 0-10 Pain Score: 0-No pain Agitated Behavior Scale: TBI Observation Details Observation Environment: CIR Start of observation period - Date: 11/14/21 Start of observation period - Time: 1550 End of observation period - Date: 11/14/21 End of observation period - Time: 1605 Agitated Behavior Scale (DO NOT LEAVE BLANKS) Short attention span, easy distractibility, inability to concentrate: Absent Impulsive, impatient, low tolerance for pain or frustration: Absent Uncooperative, resistant to care, demanding: Absent Violent and/or threatening violence toward people or property: Absent Explosive and/or unpredictable anger: Absent Rocking, rubbing, moaning, or other self-stimulating behavior: Absent Pulling at tubes, restraints, etc.: Absent Wandering from treatment areas: Absent Restlessness, pacing, excessive movement: Absent Repetitive behaviors, motor, and/or verbal: Absent Rapid, loud, or excessive talking: Absent Sudden changes of mood: Absent Easily initiated or excessive crying and/or laughter: Absent Self-abusiveness, physical and/or verbal: Absent Agitated behavior scale total score: 14      Therapy/Group: Individual Therapy  Karoline Caldwell, PT, DPT, CBIS  11/14/2021, 9:17 AM

## 2021-11-14 NOTE — Progress Notes (Signed)
Orthopedic Tech Progress Note Patient Details:  Kevin [name] 03-14-1946 [id number]  Abdominal binder applied with pt seated in recliner.  Order would not populate as an ortho tech order, but can be found in nursing orders where it prompts nursing staff to call ortho techs to apply item. Spoke with [name], RN and confirmed we can both see the order under "nursing activities and treatment" and to proceed as normal in regards applying the abdominal binder.  Ortho Devices Type of Ortho Device: Abdominal binder Ortho Device/Splint Location: on patient, abdomen Ortho Device/Splint Interventions: Ordered, Application, Adjustment   Post Interventions Patient Tolerated: Well Instructions Provided: Care of device, Adjustment of device  [name] [name] 11/14/2021, 12:56 PM

## 2021-11-14 NOTE — Progress Notes (Signed)
Physical Therapy Session Note  Patient Details  Name: Kevin [name] MRN: [id number] Date of Birth: February 12, 1946  Today's Date: 11/14/2021 PT Individual Time: 1430-1502 PT Individual Time Calculation (min): 32 min  Missed PT time:  28 min Short Term Goals: Week 1:  PT Short Term Goal 1 (Week 1): = LTG due to ELOS  Skilled Therapeutic Interventions/Progress Updates: Pt presents semi-reclined in bed and stating that he just vomited, verified by nurse.  Pt states that he can't do much, but does agree to bed exercises.  Pt performed AP, HS, abd/add, and bridging 3 x 10.  Pt requires rest breaks between sets 2/2 fatigue, stating that the vomiting " took it all out of me."  Pt able to scoot to [facility] using siderail and B LEs.  Bed alarm on and all needs in reach.     Therapy Documentation Precautions:  Precautions Precautions: Fall Precaution Comments: pacemaker, dizzines/tinnitis Restrictions Weight Bearing Restrictions: No RLE Weight Bearing: Weight bearing as tolerated General: PT Amount of Missed Time (min): 28 Minutes PT Missed Treatment Reason: Patient ill (Comment);Patient fatigue (recent emesis) Vital Signs: Therapy Vitals Temp: 97.9 F (36.6 C) Temp Source: Oral Pulse Rate: 93 Resp: 18 BP: 109/69 Patient Position (if appropriate): Lying Oxygen Therapy SpO2: 98 % O2 Device: Room Air Pain:0/10 Pain Assessment Pain Scale: 0-10 Pain Score: 2  Pain Type: Acute pain Pain Location: Head Pain Orientation: Right;Left Pain Descriptors / Indicators: Headache Pain Frequency: Intermittent Pain Onset: On-going Patients Stated Pain Goal: 2 Pain Intervention(s): Medication (See eMAR) Multiple Pain Sites: No Mobility:      Therapy/Group: Individual Therapy  Ladoris Gene 11/14/2021, 3:47 PM

## 2021-11-15 DIAGNOSIS — S065XAA Traumatic subdural hemorrhage with loss of consciousness status unknown, initial encounter: Secondary | ICD-10-CM | POA: Diagnosis not present

## 2021-11-15 LAB — BASIC METABOLIC PANEL
Anion gap: 8 (ref 5–15)
BUN: 22 mg/dL (ref 8–23)
CO2: 22 mmol/L (ref 22–32)
Calcium: 8.6 mg/dL — ABNORMAL LOW (ref 8.9–10.3)
Chloride: 105 mmol/L (ref 98–111)
Creatinine, Ser: 1.25 mg/dL — ABNORMAL HIGH (ref 0.61–1.24)
GFR, Estimated: >60 mL/min (ref >60)
Glucose, Bld: 84 mg/dL (ref 70–99)
Potassium: 4.3 mmol/L (ref 3.5–5.1)
Sodium: 135 mmol/L (ref 135–145)

## 2021-11-15 LAB — URINALYSIS, MICROSCOPIC (REFLEX): WBC, UA: >50 WBC/hpf (ref 0–5)

## 2021-11-15 LAB — URINALYSIS, ROUTINE W REFLEX MICROSCOPIC
Bilirubin Urine: NEGATIVE
Glucose, UA: NEGATIVE mg/dL
Ketones, ur: NEGATIVE mg/dL
Nitrite: POSITIVE — AB
Protein, ur: NEGATIVE mg/dL
Specific Gravity, Urine: >1.03 — ABNORMAL HIGH (ref 1.005–1.030)
pH: 5.5 (ref 5.0–8.0)

## 2021-11-15 LAB — GLUCOSE, CAPILLARY
Glucose-Capillary: 91 mg/dL (ref 70–99)
Glucose-Capillary: 87 mg/dL (ref 70–99)
Glucose-Capillary: 88 mg/dL (ref 70–99)
Glucose-Capillary: 100 mg/dL — ABNORMAL HIGH (ref 70–99)

## 2021-11-15 MED ORDER — CEPHALEXIN 250 MG PO CAPS
500.0000 mg | ORAL_CAPSULE | Freq: Three times a day (TID) | ORAL | Status: DC
  Administered 2021-11-15 – 2021-11-20 (×14): 500 mg via ORAL
  Filled 2021-11-15 (×14): qty 2

## 2021-11-15 NOTE — Progress Notes (Signed)
°   11/15/21 1135  Urine Characteristics  Urine Color Amber  Urine Appearance Clear  Urinary Interventions Bladder scan;Intermittent/Straight cath;Post void cath residual  Bladder Scan Volume (mL) 358 mL  Intermittent/Straight Cath (mL) 525 mL  Intermittent Catheter Size 16  Post Void Cath Residual (mL) 79 mL  Hygiene Peri care    Pt complaining of inability to urinate despite multiple attempts to void. Pt states relief after I&O cath. Will continue to monitor.

## 2021-11-15 NOTE — Progress Notes (Signed)
UA sent from in and out cath earlier in the shift. Result showed large amount of leukocytes, many bacteria, positive for nitrite.   Dr. Dagoberto Ligas on call provider notified. Verbal order received for Keflex 500mg  by mouth every 8 hours for 7 days. Order read back X2. Allergies and Cr. BUN reviewed with provider.

## 2021-11-15 NOTE — Progress Notes (Signed)
°   11/15/21 1700  Urine Characteristics  Bladder Scan Volume (mL) 126 mL

## 2021-11-15 NOTE — Progress Notes (Signed)
Physical Therapy Session Note  Patient Details  Name: Kevin [name] MRN: [id number] Date of Birth: 1946/05/14  Today's Date: 11/15/2021 PT Individual Time: 1120-1200 PT Individual Time Calculation (min): 40 min   Short Term Goals: Week 1:  PT Short Term Goal 1 (Week 1): = LTG due to ELOS  Skilled Therapeutic Interventions/Progress Updates: Pt presented in bed agreeable to therapy. Pt denies pain during session. NT arrived with PTA to perform bladder scan, with pt requiring I/O cath. Pt missing 15 min skilled PT due to in/out cath. Once cath completed PTA donned TED hose and ace bandages total A. Performed supine to sit with supervision from flat bed and orthostatics performed as noted below. Pt stating mild dizziness for supine to sit but asymptomatic for Sit to stand. Performed ambulatory transfer to w/c with pt having LOB posteriorly requiring minA from PTA for correction. Pt then transported to day room and performed ambulatory transfer to NuStep with RW and CGA. Pt participated in NuStep x 6 min L3 maintaining 60-70 SPM for general conditioning with BP checked after activity 117/55 (74) HR 96. Pt intermittently sliding R foot off plate ultimately requiring PTA placing band at footplate for support. Pt ambulated back to room with RW and CGA with no c/o dizziness upon standing from NuStep. Pt agreeable to sit in recliner at end of session due to it being lunchtime. Pt left in recliner at end of session with seat alarm on, call bell within reach and needs met.            Orthostatic VS for the past 24 hrs:  BP- Lying Pulse- Lying BP- Sitting Pulse- Sitting BP- Standing at 0 minutes Pulse- Standing at 0 minutes  11/15/21 1100 107/77 79 103/57 80 (!) 88/58 104         Therapy Documentation Precautions:  Precautions Precautions: Fall Precaution Comments: pacemaker, dizzines/tinnitis Restrictions Weight Bearing Restrictions: No RLE Weight Bearing: Weight bearing as  tolerated General: PT Amount of Missed Time (min): 20 Minutes PT Missed Treatment Reason: Nursing care;Other (Comment) (late arrival and I/O cath) Vital Signs:   Pain: Pain Assessment Pain Scale: 0-10 Pain Score: 0-No pain Faces Pain Scale: No hurt PAINAD (Pain Assessment in Advanced Dementia) Breathing: normal Negative Vocalization: none Facial Expression: smiling or inexpressive Body Language: relaxed Consolability: no need to console PAINAD Score: 0 Mobility:   Locomotion :    Trunk/Postural Assessment :    Balance:   Exercises:   Other Treatments:      Therapy/Group: Individual Therapy  Chibuikem Thang 11/15/2021, 12:40 PM

## 2021-11-15 NOTE — Progress Notes (Signed)
PROGRESS NOTE   Subjective/Complaints:  No more dizziness since got IVFs in last 24 hours.   Cr down to 1.25 and BUN slightly better at 22 from 24.     ROS:   Pt denies SOB, abd pain, CP, N/V/C/D, and vision changes    Objective:   No results found. No results for input(s): WBC, HGB, HCT, PLT in the last 72 hours.  Recent Labs    11/14/21 0952 11/15/21 0611  NA 136 135  K 4.5 4.3  CL 102 105  CO2 26 22  GLUCOSE 128* 84  BUN 24* 22  CREATININE 1.31* 1.25*  CALCIUM 9.0 8.6*    Intake/Output Summary (Last 24 hours) at 11/15/2021 1237 Last data filed at 11/15/2021 1135 Gross per 24 hour  Intake 1192.29 ml  Output 1127 ml  Net 65.29 ml        Physical Exam: Vital Signs Blood pressure 132/67, pulse 83, temperature 97.9 F (36.6 C), temperature source Oral, resp. rate 18, height 6\' 4"  (1.93 m), weight 62.9 kg, SpO2 98 %.     General: awake, alert, appropriate, sitting up in bed; on IVFs; NAD HENT: conjugate gaze; oropharynx moist CV: regular rate; no JVD Pulmonary: CTA B/L; no W/R/R- good air movement GI: soft, NT, ND, (+)BS Psychiatric: appropriate Neurological:  Tenting (+)-slightly better;  IV L wrist looks OK Ext: no clubbing, cyanosis, or edema Psych: pleasant and cooperative  Skin:    Comments: Lesion on right scalp (due to agent orange exposure--stable) also has a lot of ?Neurofibromas vs large freckles/moles everywhere Neurological:     Mental Status: awake and alert     Comments: Dysarthric speech. Able answer basic orientation questions without difficulty, fair insight and awareness. Stm deficits.    Cranial nerves II through XII intact, 5/5 strength in bilateral upper extremities.  3/5 RIght lower ext prox to 4/5 distal, 5/5 LLE. -stable  Assessment/Plan: 1. Functional deficits which require 3+ hours per day of interdisciplinary therapy in a comprehensive inpatient rehab  setting. Physiatrist is providing close team supervision and 24 hour management of active medical problems listed below. Physiatrist and rehab team continue to assess barriers to discharge/monitor patient progress toward functional and medical goals  Care Tool:  Bathing  Bathing activity did not occur: Safety/medical concerns Body parts bathed by patient: Right arm, Right lower leg, Left arm, Left lower leg, Chest, Abdomen, Face, Front perineal area, Buttocks, Right upper leg, Left upper leg         Bathing assist Assist Level: Minimal Assistance - Patient > 75%     Upper Body Dressing/Undressing Upper body dressing Upper body dressing/undressing activity did not occur (including orthotics): Safety/medical concerns What is the patient wearing?: Pull over shirt    Upper body assist Assist Level: Set up assist    Lower Body Dressing/Undressing Lower body dressing      What is the patient wearing?: Pants     Lower body assist Assist for lower body dressing: Set up assist     Toileting Toileting    Toileting assist Assist for toileting: Contact Guard/Touching assist     Transfers Chair/bed transfer  Transfers assist  Chair/bed transfer activity did  not occur: Safety/medical concerns  Chair/bed transfer assist level: Contact Guard/Touching assist Chair/bed transfer assistive device: Programmer, multimedia   Ambulation assist      Assist level: Contact Guard/Touching assist Assistive device: Walker-rolling Max distance: 150'   Walk 10 feet activity   Assist     Assist level: Contact Guard/Touching assist Assistive device: Walker-rolling   Walk 50 feet activity   Assist    Assist level: Contact Guard/Touching assist Assistive device: Walker-rolling    Walk 150 feet activity   Assist    Assist level: Contact Guard/Touching assist Assistive device: Walker-rolling    Walk 10 feet on uneven surface  activity   Assist Walk 10 feet  on uneven surfaces activity did not occur: Safety/medical concerns         Wheelchair     Assist Is the patient using a wheelchair?: No             Wheelchair 50 feet with 2 turns activity    Assist            Wheelchair 150 feet activity     Assist          Blood pressure 132/67, pulse 83, temperature 97.9 F (36.6 C), temperature source Oral, resp. rate 18, height 6\' 4"  (1.93 m), weight 62.9 kg, SpO2 98 %.    Medical Problem List and Plan: 1. Functional deficits secondary to right SDH after fall             -patient may shower             -ELOS/Goals: 10-14 days S         -Continue CIR- PT, OT and SLP 2.  Antithrombotics: -DVT/anticoagulation:  Mechanical: Sequential compression devices, below knee Bilateral lower extremities             -antiplatelet therapy: Off Plavix due to hematuria.  3. Pain Management: tylenol prn.   1/8- Denies pain- con't regimen 4. Mood: LCSW to follow for evaluation and support.              -antipsychotic agents: N/A  5. Neuropsych: This patient maybe intermittently capable of making decisions on his own behalf. 6. Skin/Wound Care: Routine pressure relief measures.  7. Fluids/Electrolytes/Nutrition:  Monitor I/O. Check CMET in am.  8. Prostate cancer/Urinary retention: Monitor for hematuria.              -On Proscar with ditropan for bladder spasms.  1/6 foley out and PVR's have typically been less than 300cc  -I/O cath if needed  -stand to void when possible 1/7- could be due to low input- not drinking- will have pt stand to void.  1/8- gave IVFs only got ~8 hours yesterday- so will con't - also will check U/A and Cx since still requiring in/out caths today 9. T2DM: Hgb A 1C-  Monitor BS ac/hs. Use SSI for elevated BS.              --increased metformin to 500mg  BID CBG (last 3)  Recent Labs    11/14/21 1647 11/14/21 2121 11/15/21 0616  GLUCAP 89 78 91     1/8- BG's controlled- con't regimen 10. Chronic  combined systolic/diastolic CHF: Daily weights with low salt diet.  --Monitor for signs of overload.  Filed Weights   11/12/21 0526 11/13/21 0419 11/14/21 0500  Weight: 66.6 kg 66.3 kg 62.9 kg   -1/6 monitor for upward trend  1/7- Weight down 4kg- not sure if right  or due to not drinking- will monitor 1/8- no weight today- will ask for weight to be done.  11. Alzheimer's dementia: Followed by Dr. Delice Lesch. Continue Namenda and Aricept             --Wellbutrin for anger management.              --used ativan prn for anxiety issues.  12. Underweight/cachexia-  BMI 17.98: encourage healthy diet.  13. Constipation. Last bm 1/2  1/6 give sorbitol today, SSE if needed 14. Insomnia  1/4-  scheduled Trazodone 50 mg QHS nightly- --improved 15. Hypotension/near syncope  1/7- BUN/Cr elevated and BP 80s/50- symptomatic- will give IVFs 75 cc hour x 24 and recheck labs in AM.   1/8- BUN slightly better at 22 and Cr 1.25- will recheck in AM and might need more IVFs- but only got ~ 10-12 hours of IVFs prior to getting labs- will not reorder more for now because of CHF-  16. Urinary retention  1/8- will check U/A and Cx about this.        LOS: 5 days A FACE TO FACE EVALUATION WAS PERFORMED  Dianely Krehbiel 11/15/2021, 12:37 PM

## 2021-11-15 NOTE — Progress Notes (Signed)
Physical Therapy Session Note  Patient Details  Name: Kevin [name] MRN: [id number] Date of Birth: Mar 21, 1946  Today's Date: 11/15/2021 PT Individual Time: 1300-1400 PT Individual Time Calculation (min): 60 min   Short Term Goals: Week 1:  PT Short Term Goal 1 (Week 1): = LTG due to ELOS  Skilled Therapeutic Interventions/Progress Updates:    Pt received seated in bed, agreeable to PT session. No complaints of pain. Semi-reclined BP 107/62. Supine to sit with Supervision. Seated BP 103/61 with mild dizziness noted with position change, symptoms resolve with seated rest break. Pt also wearing thigh high TEDs and BLE ACE wrap during session. Sit to stand and stand pivot transfer with RW and CGA during session. Standing BP 105/70, pt remains asymptomatic. Ambulation 2 x 150 ft with RW and CGA for balance, some unsteadiness noted when turning to back up w/c to sit. Ambulation with RW and CGA through obstacle course navigating over objects, weaving through cones, and standing on airex with no UE support x 30 sec. Ascend/descend 4 x 6" stairs with 2 handrails and CGA for balance, x 2 reps with seated rest break in between. Sidesteps L/R 2 x 10 ft each direction with BUE support on railing hallway and CGA for balance. Ambulation back to room at end of session with RW and CGA for balance. Pt reports dizziness following standing activity during session, BP 119/66 and symptoms resolve with seated rest break.Pt returned to bed at Supervision level. Pt left seated in bed with needs in reach, bed alarm in place.  Therapy Documentation Precautions:  Precautions Precautions: Fall Precaution Comments: pacemaker, dizzines/tinnitis Restrictions Weight Bearing Restrictions: No RLE Weight Bearing: Weight bearing as tolerated      Therapy/Group: Individual Therapy   Excell Seltzer, PT, DPT, CSRS  11/15/2021, 5:15 PM

## 2021-11-16 DIAGNOSIS — I5042 Chronic combined systolic (congestive) and diastolic (congestive) heart failure: Secondary | ICD-10-CM | POA: Diagnosis not present

## 2021-11-16 DIAGNOSIS — S065XAA Traumatic subdural hemorrhage with loss of consciousness status unknown, initial encounter: Secondary | ICD-10-CM | POA: Diagnosis not present

## 2021-11-16 DIAGNOSIS — E119 Type 2 diabetes mellitus without complications: Secondary | ICD-10-CM | POA: Diagnosis not present

## 2021-11-16 DIAGNOSIS — G301 Alzheimer's disease with late onset: Secondary | ICD-10-CM | POA: Diagnosis not present

## 2021-11-16 LAB — BASIC METABOLIC PANEL
Anion gap: 8 (ref 5–15)
BUN: 17 mg/dL (ref 8–23)
CO2: 26 mmol/L (ref 22–32)
Calcium: 8.8 mg/dL — ABNORMAL LOW (ref 8.9–10.3)
Chloride: 104 mmol/L (ref 98–111)
Creatinine, Ser: 1.15 mg/dL (ref 0.61–1.24)
GFR, Estimated: >60 mL/min (ref >60)
Glucose, Bld: 88 mg/dL (ref 70–99)
Potassium: 4.1 mmol/L (ref 3.5–5.1)
Sodium: 138 mmol/L (ref 135–145)

## 2021-11-16 LAB — CBC
HCT: 38.3 % — ABNORMAL LOW (ref 39.0–52.0)
Hemoglobin: 12.6 g/dL — ABNORMAL LOW (ref 13.0–17.0)
MCH: 33.8 pg (ref 26.0–34.0)
MCHC: 32.9 g/dL (ref 30.0–36.0)
MCV: 102.7 fL — ABNORMAL HIGH (ref 80.0–100.0)
Platelets: 135 10*3/uL — ABNORMAL LOW (ref 150–400)
RBC: 3.73 MIL/uL — ABNORMAL LOW (ref 4.22–5.81)
RDW: 13.1 % (ref 11.5–15.5)
WBC: 6.5 10*3/uL (ref 4.0–10.5)
nRBC: 0 % (ref 0.0–0.2)

## 2021-11-16 LAB — GLUCOSE, CAPILLARY
Glucose-Capillary: 87 mg/dL (ref 70–99)
Glucose-Capillary: 114 mg/dL — ABNORMAL HIGH (ref 70–99)
Glucose-Capillary: 130 mg/dL — ABNORMAL HIGH (ref 70–99)
Glucose-Capillary: 107 mg/dL — ABNORMAL HIGH (ref 70–99)

## 2021-11-16 NOTE — Progress Notes (Signed)
Physical Therapy Session Note  Patient Details  Name: Kevin [name] MRN: [id number] Date of Birth: 1946/06/21  Today's Date: 11/16/2021 PT Individual Time: 0037-0488 PT Individual Time Calculation (min): 41 min   Short Term Goals: Week 1:  PT Short Term Goal 1 (Week 1): = LTG due to ELOS  Skilled Therapeutic Interventions/Progress Updates:  Patient supine in bed on entrance to room. Patient alert and agreeable to PT session. Wife present and seated in recliner.   Patient with no pain complaint throughout session.  Therapeutic Activity: Bed Mobility: Patient performed supine <> sit with supervision requiring extra time to complete. VC/ tc required for effort but none for technique. Transfers: Patient performed sit<>stand and stand pivot transfers throughout session with MinA to find balance. Provided vc/ tc for forward lean.   Gait Training:  Patient ambulated 17' x2 using RW with CGA. Demonstrated slow pace with flexed posture and increased sway especially without RW.  Provided vc/ tc for improving step height/ length and heel strike.  Neuromuscular Re-ed: NMR facilitated during session with focus on standing balance. Pt guided in lateral walking 40' x2 each direction with seated rest between bouts. Progressed to forward lunges with BUE support on therapist's arms. Focus on leading LE knee flexion and small amplitude ballistic return to initial stance and balance throughout. Adjusted to lateral step out into squat and small amplitude ballistic return to stance.  NMR performed for improvements in motor control and coordination, balance, sequencing, judgement, and self confidence/ efficacy in performing all aspects of mobility at highest level of independence.   Patient supine  in bed at end of session with brakes locked, bed alarm set, and all needs within reach. Wife present in room.     Therapy Documentation Precautions:  Precautions Precautions: Fall Precaution Comments:  pacemaker, dizzines/tinnitis Restrictions Weight Bearing Restrictions: No RLE Weight Bearing: Weight bearing as tolerated General:   Vital Signs:   Pain: Pain Assessment Pain Scale: 0-10 Pain Score: 0-No pain  Therapy/Group: Individual Therapy  Alger Simons PT, DPT 11/16/2021, 12:47 PM

## 2021-11-16 NOTE — Progress Notes (Signed)
Occupational Therapy TBI Note  Patient Details  Name: Kevin [name] MRN: [id number] Date of Birth: 07-11-1946  Today's Date: 11/16/2021 OT Individual Time: 1100-1210 OT Individual Time Calculation (min): 70 min    Short Term Goals: Week 1:  OT Short Term Goal 1 (Week 1): STG= LTG d/t ELOS  Skilled Therapeutic Interventions/Progress Updates:    Pt greeted seated in wc and agreeable to OT treatment session. Pt agreeable to shower today. OT took BP sitting in wc with TEDs, ACE wraps, and abdominal binder-BP 113/75. OT removed Ace wraps and checked BP again and it had dropped to 82/69 without positional change. OT placed Ace wraps back BP 107/69, then shower completed with TEDs and Ace wraps on. Abdominal binder removed once seated on tub bench with CGA for stand-pivot. Bathing completed using lateral leans to wash buttocks with Supervision and verbal cues for technique. Pt pivoted back out of shower with CGA. UB dressing completed with set-up A, then OT assisted with removing TED hose and Acw wraps, drying legs off, and replacing them with dry TEDs and Ace wraps. Pt then able to thread underwear and pants with CGA to stand to pull them up. Grooming tasks completed from wc at the sink with supervision. Pt left seated in wc with alarm belt on, call bell in reach, and needs met.   Therapy Documentation Precautions:  Precautions Precautions: Fall Precaution Comments: pacemaker, dizzines/tinnitis Restrictions Weight Bearing Restrictions: No RLE Weight Bearing: Weight bearing as tolerated  Pain: Pain Assessment Pain Scale: 0-10 Pain Score: 0-No pain Agitated Behavior Scale: TBI Observation Details Observation Environment: CIR Start of observation period - Date: 11/16/21 Start of observation period - Time: 1100 End of observation period - Date: 11/16/21 End of observation period - Time: 1210 Agitated Behavior Scale (DO NOT LEAVE BLANKS) Short attention span, easy distractibility,  inability to concentrate: Absent Impulsive, impatient, low tolerance for pain or frustration: Absent Uncooperative, resistant to care, demanding: Absent Violent and/or threatening violence toward people or property: Absent Explosive and/or unpredictable anger: Absent Rocking, rubbing, moaning, or other self-stimulating behavior: Absent Pulling at tubes, restraints, etc.: Absent Wandering from treatment areas: Absent Restlessness, pacing, excessive movement: Absent Repetitive behaviors, motor, and/or verbal: Absent Rapid, loud, or excessive talking: Absent Sudden changes of mood: Absent Easily initiated or excessive crying and/or laughter: Absent Self-abusiveness, physical and/or verbal: Absent Agitated behavior scale total score: 14    Therapy/Group: Individual Therapy  Valma Cava 11/16/2021, 1:01 PM

## 2021-11-16 NOTE — Progress Notes (Signed)
PROGRESS NOTE   Subjective/Complaints:  Pt without new complaints today. Denies any dizziness. Retaining urine   ROS: Patient denies fever, rash, sore throat, blurred vision, nausea, vomiting, diarrhea, cough, shortness of breath or chest pain,   headache, or mood change.     Objective:   No results found. Recent Labs    11/16/21 0532  WBC 6.5  HGB 12.6*  HCT 38.3*  PLT 135*    Recent Labs    11/15/21 0611 11/16/21 0532  NA 135 138  K 4.3 4.1  CL 105 104  CO2 22 26  GLUCOSE 84 88  BUN 22 17  CREATININE 1.25* 1.15  CALCIUM 8.6* 8.8*    Intake/Output Summary (Last 24 hours) at 11/16/2021 1001 Last data filed at 11/16/2021 0900 Gross per 24 hour  Intake 466 ml  Output 2967 ml  Net -2501 ml        Physical Exam: Vital Signs Blood pressure 117/66, pulse 83, temperature 99 F (37.2 C), resp. rate 18, height 6\' 4"  (1.93 m), weight 66.3 kg, SpO2 96 %.     Constitutional: No distress . Vital signs reviewed. HEENT: NCAT, EOMI, oral membranes moist Neck: supple Cardiovascular: RRR without murmur. No JVD    Respiratory/Chest: CTA Bilaterally without wheezes or rales. Normal effort    GI/Abdomen: BS +, non-tender, non-distended Ext: no clubbing, cyanosis, or edema Psych: pleasant and cooperative  Skin:    Comments: Lesion on right scalp (due to agent orange exposure--stable) also has a lot of ?Neurofibromas vs large freckles/moles everywhere Neurological:     Mental Status: awake and alert     Comments: speech fairly clear. Improving insight and awareness. Oriented to month, year, needed cues for date. Follows all commands.   Cranial nerves II through XII intact, 5/5 strength in bilateral upper extremities.  3/5 RIght lower ext prox to 4/5 distal, 5/5 LLE. -motor stable  Assessment/Plan: 1. Functional deficits which require 3+ hours per day of interdisciplinary therapy in a comprehensive inpatient rehab  setting. Physiatrist is providing close team supervision and 24 hour management of active medical problems listed below. Physiatrist and rehab team continue to assess barriers to discharge/monitor patient progress toward functional and medical goals  Care Tool:  Bathing  Bathing activity did not occur: Safety/medical concerns Body parts bathed by patient: Right arm, Right lower leg, Left arm, Left lower leg, Chest, Abdomen, Face, Front perineal area, Buttocks, Right upper leg, Left upper leg         Bathing assist Assist Level: Minimal Assistance - Patient > 75%     Upper Body Dressing/Undressing Upper body dressing Upper body dressing/undressing activity did not occur (including orthotics): Safety/medical concerns What is the patient wearing?: Pull over shirt    Upper body assist Assist Level: Set up assist    Lower Body Dressing/Undressing Lower body dressing      What is the patient wearing?: Pants     Lower body assist Assist for lower body dressing: Set up assist     Toileting Toileting    Toileting assist Assist for toileting: Contact Guard/Touching assist     Transfers Chair/bed transfer  Transfers assist  Chair/bed transfer activity did not  occur: Safety/medical concerns  Chair/bed transfer assist level: Contact Guard/Touching assist Chair/bed transfer assistive device: Programmer, multimedia   Ambulation assist      Assist level: Contact Guard/Touching assist Assistive device: Walker-rolling Max distance: 150'   Walk 10 feet activity   Assist     Assist level: Contact Guard/Touching assist Assistive device: Walker-rolling   Walk 50 feet activity   Assist    Assist level: Contact Guard/Touching assist Assistive device: Walker-rolling    Walk 150 feet activity   Assist    Assist level: Contact Guard/Touching assist Assistive device: Walker-rolling    Walk 10 feet on uneven surface  activity   Assist Walk 10 feet  on uneven surfaces activity did not occur: Safety/medical concerns         Wheelchair     Assist Is the patient using a wheelchair?: No             Wheelchair 50 feet with 2 turns activity    Assist            Wheelchair 150 feet activity     Assist          Blood pressure 117/66, pulse 83, temperature 99 F (37.2 C), resp. rate 18, height 6\' 4"  (1.93 m), weight 66.3 kg, SpO2 96 %.    Medical Problem List and Plan: 1. Functional deficits secondary to right SDH after fall             -patient may shower             -ELOS/Goals: 10-14 days S         -Continue CIR therapies including PT, OT, and SLP  2.  Antithrombotics: -DVT/anticoagulation:  Mechanical: Sequential compression devices, below knee Bilateral lower extremities             -antiplatelet therapy: Off Plavix due to hematuria.  3. Pain Management: tylenol prn.   1/8- Denies pain- con't regimen 4. Mood: LCSW to follow for evaluation and support.              -antipsychotic agents: N/A  5. Neuropsych: This patient maybe intermittently capable of making decisions on his own behalf. 6. Skin/Wound Care: Routine pressure relief measures.  7. Fluids/Electrolytes/Nutrition:  BUN/Cr wnl today  -received IVF this weekend 8. Prostate cancer/Urinary retention: Monitor for hematuria.              -On Proscar with ditropan for bladder spasms.  1/6 foley out and PVR's have typically been less than 300cc  -I/O cath if needed  -stand to void when possible 1/7- I/O caths prn 1/9 UA +, empiric keflex started 1/8  OOB to void, stand when possible 9. T2DM: Hgb A 1C-  Monitor BS ac/hs. Use SSI for elevated BS.              --increased metformin to 500mg  BID CBG (last 3)  Recent Labs    11/15/21 1649 11/15/21 2045 11/16/21 0537  GLUCAP 88 100* 87     1/9 cbg's controlled 10. Chronic combined systolic/diastolic CHF: Daily weights with low salt diet.  --Monitor for signs of overload.  Filed Weights    11/13/21 0419 11/14/21 0500 11/16/21 0500  Weight: 66.3 kg 62.9 kg 66.3 kg   -1/9 weight stable 11. Alzheimer's dementia: Followed by Dr. Delice Lesch. Continue Namenda and Aricept             --Wellbutrin for anger management.              --  used ativan prn for anxiety issues.  12. Underweight/cachexia-  BMI 17.98: encourage healthy diet.  13. Constipation.   moved bowels 1/8 14. Insomnia  1/4-  scheduled Trazodone 50 mg QHS nightly- --improved 15. Hypotension/near syncope  1/9 improved after IVF, was a little dry -observe for tolerance today.  -labs wnl today -encourage appopriate fluids.        LOS: 6 days A FACE TO FACE EVALUATION WAS PERFORMED  Meredith Staggers 11/16/2021, 10:01 AM

## 2021-11-16 NOTE — Progress Notes (Signed)
No ICM remote transmission received for 11/16/2021 and next ICM transmission scheduled for 12/08/2021.

## 2021-11-16 NOTE — Progress Notes (Signed)
Speech Language Pathology Daily Session Note  Patient Details  Name: Kevin [name] MRN: [id number] Date of Birth: 09-06-1946  Today's Date: 11/16/2021 SLP Individual Time: 5885-0277 SLP Individual Time Calculation (min): 45 min  Short Term Goals: Week 1: SLP Short Term Goal 1 (Week 1): Patient will utilize memory aides (calendar, daily log/memory book, etc) to orient to time and recall recent events with minA verbal and visual cues. SLP Short Term Goal 2 (Week 1): Patient will demonstrate awareness to errors during functional problem solving/reasoning tasks with modA verbal cues. SLP Short Term Goal 3 (Week 1): Patient will demonstrate adequate recall of learned strategies/precautions with modA verbal, visual cues. SLP Short Term Goal 4 (Week 1): Patient will demonstrate adequate selective attention when completing problem solving tasks in mildly distracting environment, with minA verbal cues to redirect.  Skilled Therapeutic Interventions: Skilled treatment session focused on cognitive goals. Upon arrival, patient was awake in bed and had not eaten breakfast. Patient initially declined tray but agreeable with mild encouragement. Patient alternated attention between self-feeding and a functional conversation that focused on recall and awareness for ~30 minutes with Min verbal cues for redirection. Patient unable to verbalize any goal or task he is working on with each therapy discipline despite Max A multimodal cues. Patient continues to verbalize he does not need 24 hour supervision at home, "because I won't do anything stupid." SLP provided a variety of safety scenarios in which patient required Mod verbal cues for problem solving. SLP also provided a patient with a calendar that he utilized with Min verbal cues and required Min verbal cues for recall of date after a~60 second delay. Patient left upright in bed with alarm on and all needs within reach. Continue with current plan of care.       Pain Pain Assessment Pain Scale: 0-10 Pain Score: 0-No pain  Therapy/Group: Individual Therapy  Casper Pagliuca 11/16/2021, 2:40 PM

## 2021-11-16 NOTE — Progress Notes (Signed)
Pt unable to urinate on his own. Had multiple attempt with urge to urinate without success. Pt attempted urinate standing up and sitting down on the [facility] without success.   With each urge/feeling to urinate, patient BS >400, requiring in and out cath X3 overnight. Approximately 4 hours apart.

## 2021-11-16 NOTE — Progress Notes (Signed)
Physical Therapy TBI Note  Patient Details  Name: Kevin [name] MRN: [id number] Date of Birth: 04-10-1946  Today's Date: 11/16/2021 PT Individual Time: 0903-0959 PT Individual Time Calculation (min): 56 min   Short Term Goals: Week 1:  PT Short Term Goal 1 (Week 1): = LTG due to ELOS  Skilled Therapeutic Interventions/Progress Updates:     Pt received supine in bed and agrees to therapy. No complaint of pain. PT dons bilateral thigh high ted hose whil pt is in supine. Pt performs supine to sit with bed features and verbal cues for positioning. PT dons bilateral ace wcraps and abdominal binder to assist with BP maintenance. Stand pivot transfer to [facility] with minA tactile cues for sequencing and positioning. WC transport to gym for time management. Pt's blood pressure taken in sitting and measures 93/56. Pt performs sit to stand with RW and CGA with cues for hand placement. Pt ambulates x400' with RW and CGA, with cues for upright gaze to improve posture and balance and increasing proximity to AD for safety. BP taken immediately upon sitting and registers 106/60. Following extended seated rest break, PT explains 6 minute walk test to patient and rationale for completing. Pt performs and ambulates x475' in 3:00 prior to requiring extended rest break. PT's BP assessed again and registers 119/89. No complaints of dizziness. WC transport back to room. Pt left seated in WC with alarm intact and all needs within reach.  Therapy Documentation Precautions:  Precautions Precautions: Fall Precaution Comments: pacemaker, dizzines/tinnitis Restrictions Weight Bearing Restrictions: No RLE Weight Bearing: Weight bearing as tolerated Agitated Behavior Scale: TBI Observation Details Observation Environment: CIR Start of observation period - Date: 11/16/21 Start of observation period - Time: 0900 End of observation period - Date: 11/16/21 End of observation period - Time: 1000 Agitated Behavior Scale  (DO NOT LEAVE BLANKS) Short attention span, easy distractibility, inability to concentrate: Absent Impulsive, impatient, low tolerance for pain or frustration: Absent Uncooperative, resistant to care, demanding: Absent Violent and/or threatening violence toward people or property: Absent Explosive and/or unpredictable anger: Absent Rocking, rubbing, moaning, or other self-stimulating behavior: Absent Pulling at tubes, restraints, etc.: Absent Wandering from treatment areas: Absent Restlessness, pacing, excessive movement: Absent Repetitive behaviors, motor, and/or verbal: Absent Rapid, loud, or excessive talking: Absent Sudden changes of mood: Absent Easily initiated or excessive crying and/or laughter: Absent Self-abusiveness, physical and/or verbal: Absent Agitated behavior scale total score: 14  Therapy/Group: Individual Therapy  Breck Coons, PT, DPT 11/16/2021, 4:02 PM

## 2021-11-16 NOTE — Plan of Care (Signed)
°  Problem: RH Balance Goal: LTG Patient will maintain dynamic standing with ADLs (OT) Description: LTG:  Patient will maintain dynamic standing balance with assist during activities of daily living (OT)  Flowsheets (Taken 11/16/2021 1507) LTG: Pt will maintain dynamic standing balance during ADLs with: Supervision/Verbal cueing   Problem: Sit to Stand Goal: LTG:  Patient will perform sit to stand in prep for activites of daily living with assistance level (OT) Description: LTG:  Patient will perform sit to stand in prep for activites of daily living with assistance level (OT) Flowsheets (Taken 11/16/2021 1507) LTG: PT will perform sit to stand in prep for activites of daily living with assistance level: Supervision/Verbal cueing   Problem: RH Grooming Goal: LTG Patient will perform grooming w/assist,cues/equip (OT) Description: LTG: Patient will perform grooming with assist, with/without cues using equipment (OT) Flowsheets (Taken 11/16/2021 1507) LTG: Pt will perform grooming with assistance level of: Independent with assistive device    Problem: RH Bathing Goal: LTG Patient will bathe all body parts with assist levels (OT) Description: LTG: Patient will bathe all body parts with assist levels (OT) Flowsheets (Taken 11/16/2021 1507) LTG: Pt will perform bathing with assistance level/cueing: Supervision/Verbal cueing   Problem: RH Dressing Goal: LTG Patient will perform upper body dressing (OT) Description: LTG Patient will perform upper body dressing with assist, with/without cues (OT). Flowsheets (Taken 11/16/2021 1507) LTG: Pt will perform upper body dressing with assistance level of: Supervision/Verbal cueing Goal: LTG Patient will perform lower body dressing w/assist (OT) Description: LTG: Patient will perform lower body dressing with assist, with/without cues in positioning using equipment (OT) Flowsheets (Taken 11/16/2021 1507) LTG: Pt will perform lower body dressing with assistance level  of: Supervision/Verbal cueing   Problem: RH Toileting Goal: LTG Patient will perform toileting task (3/3 steps) with assistance level (OT) Description: LTG: Patient will perform toileting task (3/3 steps) with assistance level (OT)  Flowsheets (Taken 11/16/2021 1507) LTG: Pt will perform toileting task (3/3 steps) with assistance level: Supervision/Verbal cueing   Problem: RH Toilet Transfers Goal: LTG Patient will perform toilet transfers w/assist (OT) Description: LTG: Patient will perform toilet transfers with assist, with/without cues using equipment (OT) Flowsheets (Taken 11/16/2021 1507) LTG: Pt will perform toilet transfers with assistance level of: Supervision/Verbal cueing   Problem: RH Tub/Shower Transfers Goal: LTG Patient will perform tub/shower transfers w/assist (OT) Description: LTG: Patient will perform tub/shower transfers with assist, with/without cues using equipment (OT) Flowsheets (Taken 11/16/2021 1507) LTG: Pt will perform tub/shower stall transfers with assistance level of: Contact Guard/Touching assist   Problem: RH Awareness Goal: LTG: Patient will demonstrate awareness during functional activites type of (OT) Description: LTG: Patient will demonstrate awareness during functional activites type of (OT) Flowsheets (Taken 11/16/2021 1507) Patient will demonstrate awareness during functional activites type of: Emergent LTG: Patient will demonstrate awareness during functional activites type of (OT): Supervision

## 2021-11-17 ENCOUNTER — Telehealth: Payer: Medicare HMO

## 2021-11-17 DIAGNOSIS — G301 Alzheimer's disease with late onset: Secondary | ICD-10-CM | POA: Diagnosis not present

## 2021-11-17 DIAGNOSIS — S065XAA Traumatic subdural hemorrhage with loss of consciousness status unknown, initial encounter: Secondary | ICD-10-CM | POA: Diagnosis not present

## 2021-11-17 DIAGNOSIS — E119 Type 2 diabetes mellitus without complications: Secondary | ICD-10-CM | POA: Diagnosis not present

## 2021-11-17 DIAGNOSIS — I5042 Chronic combined systolic (congestive) and diastolic (congestive) heart failure: Secondary | ICD-10-CM | POA: Diagnosis not present

## 2021-11-17 LAB — GLUCOSE, CAPILLARY
Glucose-Capillary: 129 mg/dL — ABNORMAL HIGH (ref 70–99)
Glucose-Capillary: 121 mg/dL — ABNORMAL HIGH (ref 70–99)
Glucose-Capillary: 96 mg/dL (ref 70–99)
Glucose-Capillary: 91 mg/dL (ref 70–99)

## 2021-11-17 NOTE — Discharge Instructions (Addendum)
Inpatient Rehab Discharge Instructions  [facility] Discharge date and time: No discharge date for patient encounter.   Activities/Precautions/ Functional Status: Activity: activity as tolerated Diet: diabetic diet Wound Care: Routine skin checks Functional status:  ___ No restrictions     ___ Walk up steps independently ___ 24/7 supervision/assistance   ___ Walk up steps with assistance ___ Intermittent supervision/assistance  ___ Bathe/dress independently ___ Walk with walker     _x__ Bathe/dress with assistance ___ Walk Independently    ___ Shower independently ___ Walk with assistance    ___ Shower with assistance ___ No alcohol     ___ Return to work/school ________  COMMUNITY REFERRALS UPON DISCHARGE:    Home Health:   PT     OT     ST    RN                      Agency:[facility] (resume care) Phone:[phone]  *Please expect follow-up within 2-3 days to schedule your home visit. If you have not received follow-up,be sure to contact the branch directly.*  GENERAL COMMUNITY RESOURCES FOR PATIENT/FAMILY:  *Post hospital follow-up appointment with Dr. Jonnie Kind on Tuesday, January 17 at 10:30am at [facility]. (Dr. Doyle Askew has switched to another clinic). At time of visit, be sure to request an aide for your physician to assess for need. Please be sure to follow-up with physician's office if unable to make appointment.    Special Instructions: NO smoking or alcohol  Hold  Plavix for now due to hematuria  My questions have been answered and I understand these instructions. I will adhere to these goals and the provided educational materials after my discharge from the hospital.  Patient/Caregiver Signature _______________________________ Date __________  Clinician Signature _______________________________________ Date __________  Please bring this form and your medication list with you to all your follow-up doctor's appointments.

## 2021-11-17 NOTE — Progress Notes (Signed)
Speech Language Pathology TBI Note  Patient Details  Name: Kevin [name] MRN: [id number] Date of Birth: April 27, 1946  Today's Date: 11/17/2021 SLP Individual Time: 1000-1057 SLP Individual Time Calculation (min): 57 min  Short Term Goals: Week 1: SLP Short Term Goal 1 (Week 1): Patient will utilize memory aides (calendar, daily log/memory book, etc) to orient to time and recall recent events with minA verbal and visual cues. SLP Short Term Goal 2 (Week 1): Patient will demonstrate awareness to errors during functional problem solving/reasoning tasks with modA verbal cues. SLP Short Term Goal 3 (Week 1): Patient will demonstrate adequate recall of learned strategies/precautions with modA verbal, visual cues. SLP Short Term Goal 4 (Week 1): Patient will demonstrate adequate selective attention when completing problem solving tasks in mildly distracting environment, with minA verbal cues to redirect.  Skilled Therapeutic Interventions: Skilled ST treatment focused on cognitive goals. Patient was oriented to time with min A verbal cues and required consistent verbal repetition to use his calendar in room to recall DOW/date throughout session. Patient acknowledged changes to his memory although didn't attribute these changes to recent head injury. Pt stated he would add his daily schedule/to-do list into his phone on a daily basis at prior level. Pt agreeable to enter today's therapy schedule into phone calendar with overall mod A verbal/visual cues and extended processing time. Patient entered 4 events in which he typed the name of therapy and location, as well as selected start time/end time and location. Pt displayed minimal over with each new event and required the same cues throughout. Pt required min A for spelling assist and utilized voice-to-text with min A cues. Pt required mod verbal and visual for error awareness and correction. Pt expressed "that was a lot harder than it used to be" but  motivated to get additional practice. Patient transferred to recliner using rolling walker and sup A cues for safety to reach back to chair prior to sitting. Patient was left in recliner with alarm activated and immediate needs within reach at end of session. Continue per current plan of care.      Pain Pain Assessment Pain Scale: 0-10 Pain Score: 0-No pain  Agitated Behavior Scale: TBI Observation Details Observation Environment: CIR Start of observation period - Date: 11/17/21 Start of observation period - Time: 1000 End of observation period - Date: 11/17/21 End of observation period - Time: 1100 Agitated Behavior Scale (DO NOT LEAVE BLANKS) Short attention span, easy distractibility, inability to concentrate: Absent Impulsive, impatient, low tolerance for pain or frustration: Absent Uncooperative, resistant to care, demanding: Absent Violent and/or threatening violence toward people or property: Absent Explosive and/or unpredictable anger: Absent Rocking, rubbing, moaning, or other self-stimulating behavior: Absent Pulling at tubes, restraints, etc.: Absent Wandering from treatment areas: Absent Restlessness, pacing, excessive movement: Absent Repetitive behaviors, motor, and/or verbal: Absent Rapid, loud, or excessive talking: Absent Sudden changes of mood: Absent Easily initiated or excessive crying and/or laughter: Absent Self-abusiveness, physical and/or verbal: Absent Agitated behavior scale total score: 14  Therapy/Group: Individual Therapy  Patty Sermons 11/17/2021, 12:26 PM

## 2021-11-17 NOTE — Progress Notes (Signed)
Patient ID: Kevin [name], male   DOB: January 23, 1946, 76 y.o.   MRN: [id number]  SW spoke with pt wife Kevin [name] [phone]) to provide updates from team conference, d/c date 1/13 and continued 24/7 care is required. Wife continues to report that she will be primary caregiver since she works. SW will explore aide options through [place]. SW emailed her sitter list to review to hire private aides.   *[facility] updated [facility] on above. Pt is not eligible for homemaker program as has not been established with a PCP for 2 years. SW updated pt wife on above. Current appointment is not scheduled until May 2023.   SW updated [facility] on pt d/c date.   Loralee Pacas, MSW, [place] Office: [phone] Cell: [phone] Fax: [phone]

## 2021-11-17 NOTE — Progress Notes (Signed)
PROGRESS NOTE   Subjective/Complaints:  Pt up in bed. Denies any problems this morning. Says he's emptying his bladder better bur required multiple caths  ROS: Patient denies fever, rash, sore throat, blurred vision, nausea, vomiting, diarrhea, cough, shortness of breath or chest pain, joint or back pain, headache, or mood change.     Objective:   No results found. Recent Labs    11/16/21 0532  WBC 6.5  HGB 12.6*  HCT 38.3*  PLT 135*    Recent Labs    11/15/21 0611 11/16/21 0532  NA 135 138  K 4.3 4.1  CL 105 104  CO2 22 26  GLUCOSE 84 88  BUN 22 17  CREATININE 1.25* 1.15  CALCIUM 8.6* 8.8*    Intake/Output Summary (Last 24 hours) at 11/17/2021 0917 Last data filed at 11/17/2021 0449 Gross per 24 hour  Intake 360 ml  Output 1916 ml  Net -1556 ml        Physical Exam: Vital Signs Blood pressure 111/62, pulse 92, temperature (!) 97.5 F (36.4 C), temperature source Oral, resp. rate 17, height 6\' 4"  (1.93 m), weight 66.3 kg, SpO2 100 %.     Constitutional: No distress . Vital signs reviewed. HEENT: NCAT, EOMI, oral membranes moist Neck: supple Cardiovascular: RRR without murmur. No JVD    Respiratory/Chest: CTA Bilaterally without wheezes or rales. Normal effort    GI/Abdomen: BS +, non-tender, non-distended Ext: no clubbing, cyanosis, or edema Psych: pleasant and cooperative  Skin:    Comments: Lesion on right scalp   Neurological:     Mental Status: awake and alert     Comments: speech fairly clear. Improving insight and awareness. Oriented to month, year, needed cues for date. Follows all commands.  STM deficits. Cranial nerves II through XII intact, 5/5 strength in bilateral upper extremities.  3/5 RIght lower ext prox to 4/5 distal, 5/5 LLE. exam stable  Assessment/Plan: 1. Functional deficits which require 3+ hours per day of interdisciplinary therapy in a comprehensive inpatient rehab  setting. Physiatrist is providing close team supervision and 24 hour management of active medical problems listed below. Physiatrist and rehab team continue to assess barriers to discharge/monitor patient progress toward functional and medical goals  Care Tool:  Bathing    Body parts bathed by patient: Right arm, Right lower leg, Left arm, Left lower leg, Chest, Abdomen, Face, Front perineal area, Buttocks, Right upper leg, Left upper leg         Bathing assist Assist Level: Minimal Assistance - Patient > 75%     Upper Body Dressing/Undressing Upper body dressing   What is the patient wearing?: Pull over shirt    Upper body assist Assist Level: Set up assist    Lower Body Dressing/Undressing Lower body dressing      What is the patient wearing?: Pants     Lower body assist Assist for lower body dressing: Set up assist     Toileting Toileting    Toileting assist Assist for toileting: Contact Guard/Touching assist     Transfers Chair/bed transfer  Transfers assist  Chair/bed transfer activity did not occur: Safety/medical concerns  Chair/bed transfer assist level: Contact Guard/Touching assist Chair/bed  transfer assistive device: Museum/gallery exhibitions officer assist      Assist level: Contact Guard/Touching assist Assistive device: Walker-rolling Max distance: 150'   Walk 10 feet activity   Assist     Assist level: Contact Guard/Touching assist Assistive device: Walker-rolling   Walk 50 feet activity   Assist    Assist level: Contact Guard/Touching assist Assistive device: Walker-rolling    Walk 150 feet activity   Assist    Assist level: Contact Guard/Touching assist Assistive device: Walker-rolling    Walk 10 feet on uneven surface  activity   Assist Walk 10 feet on uneven surfaces activity did not occur: Safety/medical concerns         Wheelchair     Assist Is the patient using a wheelchair?: No              Wheelchair 50 feet with 2 turns activity    Assist            Wheelchair 150 feet activity     Assist          Blood pressure 111/62, pulse 92, temperature (!) 97.5 F (36.4 C), temperature source Oral, resp. rate 17, height 6\' 4"  (1.93 m), weight 66.3 kg, SpO2 100 %.    Medical Problem List and Plan: 1. Functional deficits secondary to right SDH after fall             -patient may shower             -ELOS/Goals: 10-14 days Sup goals         -Continue CIR therapies including PT, OT, and SLP. Interdisciplinary team conference today to discuss goals, barriers to discharge, and dc planning.    2.  Antithrombotics: -DVT/anticoagulation:  Mechanical: Sequential compression devices, below knee Bilateral lower extremities             -antiplatelet therapy: Off Plavix due to hematuria.  3. Pain Management: tylenol prn.   1/9- Denies pain- con't regimen 4. Mood: LCSW to follow for evaluation and support.              -antipsychotic agents: N/A  5. Neuropsych: This patient maybe intermittently capable of making decisions on his own behalf. 6. Skin/Wound Care: Routine pressure relief measures.  7. Fluids/Electrolytes/Nutrition:  BUN/Cr wnl today  -received IVF this weekend 8. Prostate cancer/Urinary retention: Monitor for hematuria.              -On Proscar with ditropan for bladder spasms.  1/6 foley out and PVR's have typically been less than 300cc  -I/O cath if needed  -stand to void when possible 1/7- I/O caths prn 1/10 ucx with 100k GNR. empiric keflex started 1/8  Continue OOB to void, stand when possible 9. T2DM: Hgb A 1C-  Monitor BS ac/hs. Use SSI for elevated BS.              --increased metformin to 500mg  BID CBG (last 3)  Recent Labs    11/16/21 1654 11/16/21 2053 11/17/21 0544  GLUCAP 130* 107* 129*     1/10 cbg's controlled 10. Chronic combined systolic/diastolic CHF: Daily weights with low salt diet.  --Monitor for signs of overload.   Filed Weights   11/13/21 0419 11/14/21 0500 11/16/21 0500  Weight: 66.3 kg 62.9 kg 66.3 kg   -1/10 weights stable 11. Alzheimer's dementia: Followed by Dr. Delice Lesch. Continue Namenda and Aricept             --Wellbutrin  for anger management.              --used ativan prn for anxiety issues.  12. Underweight/cachexia-  BMI 17.98: encourage healthy diet.  13. Constipation.   moved bowels 1/8 14. Insomnia  1/4-  scheduled Trazodone 50 mg QHS nightly- --improved 15. Hypotension/near syncope    improved after IVF, was a little dry this weekend -observe for tolerance today.  -labs wnl  -encourage appopriate fluids.    At least 35 total minutes were spent in examination of patient, assessment of pertinent data,  formulation of a treatment plan, and in discussion with patient and/or family.      LOS: 7 days A FACE TO FACE EVALUATION WAS PERFORMED  Meredith Staggers 11/17/2021, 9:17 AM

## 2021-11-17 NOTE — Progress Notes (Signed)
Occupational Therapy TBI Note  Patient Details  Name: Kevin [name] MRN: [id number] Date of Birth: [birth year]  Today's Date: 11/17/2021 OT Individual Time: 1300-1400 OT Individual Time Calculation (min): 60 min    Short Term Goals: Week 1:  OT Short Term Goal 1 (Week 1): STG= LTG d/t ELOS  Skilled Therapeutic Interventions/Progress Updates:    Pt greeted seated in recliner and agreeable to OT treatment session. Pt declined BADL tasks or need to go to the bathroom at this time. Sit<>stand from recliner with verbal cues for hand placement for safe sit<>stand w/ RW. CGA to ambulate in room to wc by the door. Pt with TED hose, Ace wraps, and abdominal binder on and aesymptomatic of hypostension. Pt brought to therapy gym and worked on anterior weight shift with sit<>stands using beach ball held out in front of body. Pt continued to have difficulty shifting weight forward with posterior bias. OT had pt stand on small foam wedge to facilitate weight shift in standing while reaching outside base of support to collect card for matching game. UB there-ex using 2 lb dowel rod and standing balance with CGA/min A for bicep curl, chest press, and standing row. Pt returned to room and left seated in wc with alarm belt on, call bell in reach, and needs met.    Therapy Documentation Precautions:  Precautions Precautions: Fall Precaution Comments: pacemaker, dizzines/tinnitis Restrictions Weight Bearing Restrictions: No RLE Weight Bearing: Weight bearing as tolerated Pain: Pain Assessment Pain Scale: 0-10 Pain Score: 0 Agitated Behavior Scale: TBI Observation Details Observation Environment: CIR Start of observation period - Date: 11/17/21 Start of observation period - Time: 1300 End of observation period - Date: 11/17/21 End of observation period - Time: 1400 Agitated Behavior Scale (DO NOT LEAVE BLANKS) Short attention span, easy distractibility, inability to concentrate: Absent Impulsive,  impatient, low tolerance for pain or frustration: Absent Uncooperative, resistant to care, demanding: Absent Violent and/or threatening violence toward people or property: Absent Explosive and/or unpredictable anger: Absent Rocking, rubbing, moaning, or other self-stimulating behavior: Absent Pulling at tubes, restraints, etc.: Absent Wandering from treatment areas: Absent Restlessness, pacing, excessive movement: Absent Repetitive behaviors, motor, and/or verbal: Absent Rapid, loud, or excessive talking: Absent Sudden changes of mood: Absent Easily initiated or excessive crying and/or laughter: Absent Self-abusiveness, physical and/or verbal: Absent Agitated behavior scale total score: 14    Therapy/Group: Individual Therapy  Valma Cava 11/17/2021, 2:06 PM

## 2021-11-17 NOTE — Progress Notes (Signed)
Physical Therapy Session Note  Patient Details  Name: Kevin [name] MRN: [id number] Date of Birth: 22-Feb-1946  Today's Date: 11/17/2021 PT Individual Time: 0830-0926 PT Individual Time Calculation (min): 56 min   Short Term Goals: Week 1:  PT Short Term Goal 1 (Week 1): = LTG due to ELOS  Skilled Therapeutic Interventions/Progress Updates:    Patient received reclined in bed, agreeable to PT. He denies pain. BP supine with no TEDs: 100/69. MD in/out for assessment. PT donning thigh high TEDs TotalA for time. BP supine with THT: 113/65. He was able to come sit edge of bed with supervision and reported "some dizziness" upon sitting up. BP in sitting: 106/60. Patient stood with RW and light CGA. BP in standing: 83/60. PT applied abdominal binder. BP in standing with abdominal binder: 90/62. PT applying knee-high ACE wrap over thigh high TEDs. BP in standing: 97/66 with patient reporting to feel "just okay." He ambulated ~149ft to dayroom with RW and CGA. NuStep x12 mins completed with B UE/LE for LE strength/endurance as well as BP support. BP after 6 mins: 105/89. BP after last 6 mins: 106/87. Patient ambulating to therapy gym with RW and CGA. He negotiated 4 steps with B HR and CGA, self-selecting reciprocal stepping. Patient completing dynamic standing balance with dual cog task overlay arranging ping pong balls to match picture. With dual task, patient with increased posterior sway and bias requiring up to MinA to maintain balance. Patient with poor use of balance strategies. He ambulated back to his room with no additional cues for path finding. Patient returning to bed, bed alarm on, call light within reach.   Therapy Documentation Precautions:  Precautions Precautions: Fall Precaution Comments: pacemaker, dizzines/tinnitis Restrictions Weight Bearing Restrictions: No RLE Weight Bearing: Weight bearing as tolerated    Therapy/Group: Individual Therapy  Karoline Caldwell, PT, DPT, CBIS  11/17/2021, 7:32 AM

## 2021-11-17 NOTE — Patient Care Conference (Signed)
Inpatient RehabilitationTeam Conference and Plan of Care Update Date: 11/17/2021   Time: 10:44 AM    Patient Name: Kevin [name]      Medical Record Number: [id number]  Date of Birth: 1946-09-25 Sex: Male         Room/Bed: 4W20C/4W20C-01 Payor Info: Payor: [employer] / Plan: [place] / Product Type: *No Product type* /    Admit Date/Time:  11/10/2021  4:57 PM  Primary Diagnosis:  SDH (subdural hematoma)  Hospital Problems: Principal Problem:   SDH (subdural hematoma)    Expected Discharge Date: Expected Discharge Date: 11/20/21  Team Members Present: Physician leading conference: Dr. Alger Simons Social Worker Present: Loralee Pacas, [place] Nurse Present: Dorthula Nettles, RN PT Present: Estevan Ryder, PT OT Present: Cherylynn Ridges, OT SLP Present: Weston Anna, SLP PPS Coordinator present : Gunnar Fusi, SLP     Current Status/Progress Goal Weekly Team Focus  Bowel/Bladder   Unable to urinate on his own. needing in and out catheter almost every q4h with post cath residual >100 each time. continent of bowels. LBM 1/8  remain continent of bowel and able to urinate without needing in and out cath  toilet q3-4 in and out cath <350   Swallow/Nutrition/ Hydration             ADL's   Min A overall, still symptomatic orthostatic hypotension  Supervision  functional transfers, self-care retraning, activity tolerance, dc planning, pt.family ed   Mobility   Supervision bed mobility, CGA sit to stand, CGA x475' with RW, 8/56 on BERG  Supervision  Family ed, balance, ambulation, endurance, DC prep   Communication             Safety/Cognition/ Behavioral Observations  Min-Mod A  Superivsion-Min A  basic recall of functinal information with use of external aids, safety awareness, selective attention   Pain   denies pain  <3/10 pain score  asses q shift and PRN   Skin      No new skin issues  continue to assess q shift and PRN      Discharge Planning:  Home with wife who does work part time as a Pharmacist, hospital, made aware team's recommnedation is 24/7 supervision. SW will confirm if [facility] is needed with [facility].   Team Discussion: Having urinary retention with positive UTI. May have to insert foley at discharge. Baseline Dementia, going home with spouse who works but may hire assistance. Requiring I&O caths. Reports 6/10 headache. Min assist to supervision with ADL's. BP dropping, applied ted hose, ace wraps, and abdominal binder. Confused and forgetful at times. Will educate spouse on BP control. Supervision to contact guard assist, ambulation 475 ft with RW. Systolic BP > 144 when standing. Has poor safety awareness, will need aide or supervision at discharge.  Patient on target to meet rehab goals: yes, supervision overall. Recommending 24/7 supervision.  *See Care Plan and progress notes for long and short-term goals.   Revisions to Treatment Plan:  Medication adjustments  Teaching Needs: Family education, medication/pain management, bladder management, safety awareness, transfer/gait training, etc.  Current Barriers to Discharge: Decreased caregiver support, Home enviroment access/layout, Incontinence, Lack of/limited family support, and Medication compliance  Possible Resolutions to Barriers: Family education Bladder management with spouse 24/7 supervision at discharge     Medical Summary Current Status: s/p SDH, baseline dementia with memory deficits. urine retention related to prostate cancer. new uti.  Barriers to Discharge: Medical stability   Possible Resolutions to [employer] Focus: daily  assessment of labs and pt data, rx uti   Continued Need for Acute Rehabilitation Level of Care: The patient requires daily medical management by a physician with specialized training in physical medicine and rehabilitation for the following reasons: Direction of a multidisciplinary physical rehabilitation program  to maximize functional independence : Yes Medical management of patient stability for increased activity during participation in an intensive rehabilitation regime.: Yes Analysis of laboratory values and/or radiology reports with any subsequent need for medication adjustment and/or medical intervention. : Yes   I attest that I was present, lead the team conference, and concur with the assessment and plan of the team.   Cristi Loron 11/17/2021, 3:29 PM

## 2021-11-18 DIAGNOSIS — E119 Type 2 diabetes mellitus without complications: Secondary | ICD-10-CM | POA: Diagnosis not present

## 2021-11-18 DIAGNOSIS — G301 Alzheimer's disease with late onset: Secondary | ICD-10-CM | POA: Diagnosis not present

## 2021-11-18 DIAGNOSIS — I5042 Chronic combined systolic (congestive) and diastolic (congestive) heart failure: Secondary | ICD-10-CM | POA: Diagnosis not present

## 2021-11-18 DIAGNOSIS — S065XAA Traumatic subdural hemorrhage with loss of consciousness status unknown, initial encounter: Secondary | ICD-10-CM | POA: Diagnosis not present

## 2021-11-18 LAB — GLUCOSE, CAPILLARY
Glucose-Capillary: 98 mg/dL (ref 70–99)
Glucose-Capillary: 124 mg/dL — ABNORMAL HIGH (ref 70–99)
Glucose-Capillary: 76 mg/dL (ref 70–99)
Glucose-Capillary: 109 mg/dL — ABNORMAL HIGH (ref 70–99)

## 2021-11-18 LAB — URINE CULTURE: Culture: >=100000 — AB

## 2021-11-18 MED ORDER — SORBITOL 70 % SOLN: 60.0000 mL | Status: AC

## 2021-11-18 NOTE — Progress Notes (Signed)
PROGRESS NOTE   Subjective/Complaints:  No new complaints today. Still requiring regular I/O caths. Pain controlled  ROS: Patient denies fever, rash, sore throat, blurred vision, nausea, vomiting, diarrhea, cough, shortness of breath or chest pain, joint or back pain, headache, or mood change.     Objective:   No results found. Recent Labs    11/16/21 0532  WBC 6.5  HGB 12.6*  HCT 38.3*  PLT 135*    Recent Labs    11/16/21 0532  NA 138  K 4.1  CL 104  CO2 26  GLUCOSE 88  BUN 17  CREATININE 1.15  CALCIUM 8.8*    Intake/Output Summary (Last 24 hours) at 11/18/2021 1234 Last data filed at 11/18/2021 1145 Gross per 24 hour  Intake 240 ml  Output 1500 ml  Net -1260 ml        Physical Exam: Vital Signs Blood pressure 101/70, pulse 88, temperature 97.6 F (36.4 C), resp. rate 16, height 6\' 4"  (1.93 m), weight 66.3 kg, SpO2 96 %.  Constitutional: No distress . Vital signs reviewed. HEENT: NCAT, EOMI, oral membranes moist Neck: supple Cardiovascular: RRR without murmur. No JVD    Respiratory/Chest: CTA Bilaterally without wheezes or rales. Normal effort    GI/Abdomen: BS +, non-tender, non-distended Ext: no clubbing, cyanosis, or edema Psych: pleasant and cooperative  Skin:    Comments: Lesion on right scalp   Neurological:     Mental Status: awake and alert     Comments: speech clear. Fair awareness. STM deficits. Oriented to person, place, month/year with cues. Follows all commands.  STM deficits. Cranial nerves II through XII intact, 5/5 strength in bilateral upper extremities.  3+/5 RIght lower ext prox to 4/5 distal, 5/5 LLE. exam stable  Assessment/Plan: 1. Functional deficits which require 3+ hours per day of interdisciplinary therapy in a comprehensive inpatient rehab setting. Physiatrist is providing close team supervision and 24 hour management of active medical problems listed  below. Physiatrist and rehab team continue to assess barriers to discharge/monitor patient progress toward functional and medical goals  Care Tool:  Bathing    Body parts bathed by patient: Right arm, Right lower leg, Left arm, Left lower leg, Chest, Abdomen, Face, Front perineal area, Buttocks, Right upper leg, Left upper leg         Bathing assist Assist Level: Minimal Assistance - Patient > 75%     Upper Body Dressing/Undressing Upper body dressing   What is the patient wearing?: Pull over shirt    Upper body assist Assist Level: Set up assist    Lower Body Dressing/Undressing Lower body dressing      What is the patient wearing?: Pants     Lower body assist Assist for lower body dressing: Set up assist     Toileting Toileting    Toileting assist Assist for toileting: Contact Guard/Touching assist     Transfers Chair/bed transfer  Transfers assist  Chair/bed transfer activity did not occur: Safety/medical concerns  Chair/bed transfer assist level: Contact Guard/Touching assist Chair/bed transfer assistive device: Programmer, multimedia   Ambulation assist      Assist level: Contact Guard/Touching assist Assistive device: Walker-rolling Max distance:  150'   Walk 10 feet activity   Assist     Assist level: Contact Guard/Touching assist Assistive device: Walker-rolling   Walk 50 feet activity   Assist    Assist level: Contact Guard/Touching assist Assistive device: Walker-rolling    Walk 150 feet activity   Assist    Assist level: Contact Guard/Touching assist Assistive device: Walker-rolling    Walk 10 feet on uneven surface  activity   Assist Walk 10 feet on uneven surfaces activity did not occur: Safety/medical concerns         Wheelchair     Assist Is the patient using a wheelchair?: No             Wheelchair 50 feet with 2 turns activity    Assist            Wheelchair 150 feet  activity     Assist          Blood pressure 101/70, pulse 88, temperature 97.6 F (36.4 C), resp. rate 16, height 6\' 4"  (1.93 m), weight 66.3 kg, SpO2 96 %.    Medical Problem List and Plan: 1. Functional deficits secondary to right SDH after fall             -patient may shower             -ELOS/Goals: 1/13, Sup goals            -Continue CIR therapies including PT, OT, and SLP  2.  Antithrombotics: -DVT/anticoagulation:  Mechanical: Sequential compression devices, below knee Bilateral lower extremities             -antiplatelet therapy: Off Plavix due to hematuria.  3. Pain Management: tylenol prn.   1/9- Denies pain- con't regimen 4. Mood: LCSW to follow for evaluation and support.              -antipsychotic agents: N/A  5. Neuropsych: This patient maybe intermittently capable of making decisions on his own behalf. 6. Skin/Wound Care: Routine pressure relief measures.  7. Fluids/Electrolytes/Nutrition:  BUN/Cr wnl recently  -received IVF this past weekend 8. Prostate cancer/Urinary retention: Monitor for hematuria.              -On Proscar with ditropan for bladder spasms.  1/6 foley out and PVR's have typically been less than 300cc  -I/O cath if needed  -stand to void when possible 1/7- I/O caths prn 1/11 ucx with 100k klebsiella. empiric keflex started 1/8  Continue OOB to void, stand when possible  -suspect he'll go home with foley again 9. T2DM: Hgb A 1C-  Monitor BS ac/hs. Use SSI for elevated BS.              --increased metformin to 500mg  BID CBG (last 3)  Recent Labs    11/17/21 2126 11/18/21 0619 11/18/21 1129  GLUCAP 91 98 124*     1/11 cbg's controlled 10. Chronic combined systolic/diastolic CHF: Daily weights with low salt diet.  --Monitor for signs of overload.  Filed Weights   11/13/21 0419 11/14/21 0500 11/16/21 0500  Weight: 66.3 kg 62.9 kg 66.3 kg   -no weights for 2 days. 11. Alzheimer's dementia: Followed by Dr. Delice Lesch. Continue Namenda  and Aricept             --Wellbutrin for anger management.              --used ativan prn for anxiety issues.  12. Underweight/cachexia-  BMI 17.98: encourage healthy diet.  13.  Constipation.   moved bowels 1/8--needs bm today-->sorbitol 14. Insomnia  1/4-  scheduled Trazodone 50 mg QHS nightly- --improved 15. Hypotension/near syncope    improved after IVF, was a little dry this weekend -observe for tolerance today.  -labs wnl  -encourage appopriate fluids.    At least 35 total minutes were spent in examination of patient, assessment of pertinent data,  formulation of a treatment plan, and in discussion with patient and/or family.      LOS: 8 days A FACE TO FACE EVALUATION WAS PERFORMED  Meredith Staggers 11/18/2021, 12:34 PM

## 2021-11-18 NOTE — Progress Notes (Signed)
Physical Therapy TBI Note  Patient Details  Name: Kevin [name] MRN: [id number] Date of Birth: 13-Dec-1945  Today's Date: 11/18/2021 PT Individual Time: 1010-1105 PT Individual Time Calculation (min): 55 min   Short Term Goals: Week 1:  PT Short Term Goal 1 (Week 1): = LTG due to ELOS  Skilled Therapeutic Interventions/Progress Updates:     Patient in w/c with B thigh high TED hose and abdominal binder donned in the room upon PT arrival. Patient alert and agreeable to PT session. Patient denied pain during session.  Focused session on observation of BP with therapeutic exercise and functional mobility.  Vitals: Sitting: BP 88/67, HR 92 Following NuStep: BP 117/75, HR 100 Following Gait trial 1: BP 103/66, HR 99, RPE 5/10 Following Gait trial 2: BP 114/65, HR 102, RPE 7/10   Therapeutic Activity: Transfers: Patient performed stand pivot w/c<>NuStep with CGA-min A without an AD and sit to/from stand x4 with CGA-close supervision using RW. Provided verbal cues for reaching back to control descent with poor carry-over requiring cues each trial. Patient performed standing balance without upper extremity support 2x2-3 min, able to bend forward and reach down and to each side while maintaining balance with CGA-supervision.   Therapeutic Exercise: Patient performed the following exercises with verbal and tactile cues for proper technique. -NuStep x5 min on WL 2 maintaining 60-70 steps per min, required green band around R foot and foot plat to maintain foot placement -ambulated 170 feet and 362 feet using RW with CGA and w/c follow due to hx of hypotension; focused on pacing and increased distance for increased activity tolerance and improved cardiovascular challenge  Patient in w/c in the room at end of session with breaks locked, seat belt alarm set, and all needs within reach.   Therapy Documentation Precautions:  Precautions Precautions: Fall Precaution Comments: pacemaker,  dizzines/tinnitis Restrictions Weight Bearing Restrictions: No RLE Weight Bearing: Weight bearing as tolerated Agitated Behavior Scale: TBI Observation Details Observation Environment: CIR Start of observation period - Date: 11/18/21 Start of observation period - Time: 1010 End of observation period - Date: 11/18/21 End of observation period - Time: 1105 Agitated Behavior Scale (DO NOT LEAVE BLANKS) Short attention span, easy distractibility, inability to concentrate: Absent Impulsive, impatient, low tolerance for pain or frustration: Absent Uncooperative, resistant to care, demanding: Absent Violent and/or threatening violence toward people or property: Absent Explosive and/or unpredictable anger: Absent Rocking, rubbing, moaning, or other self-stimulating behavior: Absent Pulling at tubes, restraints, etc.: Absent Wandering from treatment areas: Absent Restlessness, pacing, excessive movement: Absent Repetitive behaviors, motor, and/or verbal: Absent Rapid, loud, or excessive talking: Absent Sudden changes of mood: Absent Easily initiated or excessive crying and/or laughter: Absent Self-abusiveness, physical and/or verbal: Absent Agitated behavior scale total score: 14    Therapy/Group: Individual Therapy  Sabastion Hrdlicka L Oona Trammel PT, DPT  11/18/2021, 3:46 PM

## 2021-11-18 NOTE — Progress Notes (Signed)
Patient ID: Kevin [name], male   DOB: 1946/02/10, 76 y.o.   MRN: [id number]  SW made efforts to speak with a representative with [facility] [phone]) eligibility department to discuss post discharge appt for possibley earlier than scheduled PCP appt in May 2023 but no answer. SW will continue to make efforts.   Loralee Pacas, MSW, [place] Office: [phone] Cell: [phone] Fax: [phone]

## 2021-11-18 NOTE — Progress Notes (Signed)
Occupational Therapy TBI Note  Patient Details  Name: Kevin [name] MRN: [id number] Date of Birth: December 24, 1945  Session 1: Today's Date: 11/18/2021 OT Individual Time: 0174-9449 OT Individual Time Calculation (min): 45 min    Session 2: Today's Date: 11/18/2021 OT Individual Time: 1300-1330 OT Individual Time Calculation (min): 30 min    Short Term Goals: Week 1:  OT Short Term Goal 1 (Week 1): STG= LTG d/t ELOS  Skilled Therapeutic Interventions/Progress Updates:    Pt supine with no c/o pain, agreeable to OT session. He completed bed mobiltiy to EOB with (S). Abdominal binder donned as well as thigh high teds. Pt used RW to complete sit > stand with min cueing for hand placement and CGA to stand. Stand pivot transfer to the w/c with close (S). BP assessed after sitting down- 107/60. Grooming tasks and oral care with set up assist seated. Completed hair washing with pt leaning forward with mod A. Pt completed UB bathing and dressing with (S). Skilled monitoring of vitals for hemodynamic stability throughout session, using OH s/s as a guide. LB dressing with CGA for boxers and min A to don pants d/t legs catching on non slip socks. He was able to stand from w/c with no lifting A and CGA for standing balance. 120/64 following standing. Pt was left sitting up in the w/c with all needs met, chair alarm set.   Session 2: Pt received sitting up in the recliner with no c/o pain. BP seated 89/62, pt mildly symptomatic, wearing binder and teds. Pt stood with CGA and completed an ambulatory transfer to the w/c with close (S). Min cueing for pacing. His BP increased to 104/68- no longer symptomatic. He was taken to the dayroom via w/c. He completed blocked practice sit <> stand without UE support to challenge LE stability/strength, 3x5 repetitions. He required mod cueing to complete and min A for standing balance without the RW. Pt then completed a golf putting activity, with min A provided  posteriorly while he hit several golf balls with BUE on the putter. Pt returned to his room, 120 ft of functional mobility with CGA. He was left supine with all needs met, bed alarm set. ABS 14   Therapy Documentation Precautions:  Precautions Precautions: Fall Precaution Comments: pacemaker, dizzines/tinnitis Restrictions Weight Bearing Restrictions: No RLE Weight Bearing: Weight bearing as tolerated Agitated Behavior Scale: TBI Observation Details Observation Environment: CIR Start of observation period - Date: 11/18/21 Start of observation period - Time: 0845 End of observation period - Date: 11/18/21 End of observation period - Time: 0930 Agitated Behavior Scale (DO NOT LEAVE BLANKS) Short attention span, easy distractibility, inability to concentrate: Absent Impulsive, impatient, low tolerance for pain or frustration: Absent Uncooperative, resistant to care, demanding: Absent Violent and/or threatening violence toward people or property: Absent Explosive and/or unpredictable anger: Absent Rocking, rubbing, moaning, or other self-stimulating behavior: Absent Pulling at tubes, restraints, etc.: Absent Wandering from treatment areas: Absent Restlessness, pacing, excessive movement: Absent Repetitive behaviors, motor, and/or verbal: Absent Rapid, loud, or excessive talking: Absent Sudden changes of mood: Absent Easily initiated or excessive crying and/or laughter: Absent Self-abusiveness, physical and/or verbal: Absent Agitated behavior scale total score: 14   Therapy/Group: Individual Therapy  Curtis Sites 11/18/2021, 9:56 AM

## 2021-11-18 NOTE — Progress Notes (Signed)
Speech Language Pathology TBI Note  Patient Details  Name: Kevin [name] MRN: [id number] Date of Birth: 19-Jun-1946  Today's Date: 11/18/2021 SLP Individual Time: 0750-0830 SLP Individual Time Calculation (min): 40 min  Short Term Goals: Week 1: SLP Short Term Goal 1 (Week 1): Patient will utilize memory aides (calendar, daily log/memory book, etc) to orient to time and recall recent events with minA verbal and visual cues. SLP Short Term Goal 2 (Week 1): Patient will demonstrate awareness to errors during functional problem solving/reasoning tasks with modA verbal cues. SLP Short Term Goal 3 (Week 1): Patient will demonstrate adequate recall of learned strategies/precautions with modA verbal, visual cues. SLP Short Term Goal 4 (Week 1): Patient will demonstrate adequate selective attention when completing problem solving tasks in mildly distracting environment, with minA verbal cues to redirect.  Skilled Therapeutic Interventions: Skilled treatment session focused on cognitive goals. SLP facilitated session by providing overall Min A verbal cues for recall of "tasks" that needed to be completed prior to standing or getting out of bed (donning TED hoes and binder as well as having someone present). Patient reported he often utilizes his phone for reminders and recall of functional information. Patient entered today's schedule into his phone requiring overall Mod A verbal and visual cues for navigation and error awareness when inputting the wrong information. Patient's accuracy with information improved when using voice-to-text. Patient left upright in bed with alarm on and all needs within reach. Continue with current plan of care.    Pain No/Denies Pain   Agitated Behavior Scale: TBI Observation Details Observation Environment: Patient's room Start of observation period - Date: 11/18/21 Start of observation period - Time: 0750 End of observation period - Date: 11/18/21 End of  observation period - Time: 0830 Agitated Behavior Scale (DO NOT LEAVE BLANKS) Short attention span, easy distractibility, inability to concentrate: Absent Impulsive, impatient, low tolerance for pain or frustration: Absent Uncooperative, resistant to care, demanding: Absent Violent and/or threatening violence toward people or property: Absent Explosive and/or unpredictable anger: Absent Rocking, rubbing, moaning, or other self-stimulating behavior: Absent Pulling at tubes, restraints, etc.: Absent Wandering from treatment areas: Absent Restlessness, pacing, excessive movement: Absent Repetitive behaviors, motor, and/or verbal: Absent Rapid, loud, or excessive talking: Absent Sudden changes of mood: Absent Easily initiated or excessive crying and/or laughter: Absent Self-abusiveness, physical and/or verbal: Absent Agitated behavior scale total score: 14  Therapy/Group: Individual Therapy  Georgi Navarrete 11/18/2021, 12:03 PM

## 2021-11-19 DIAGNOSIS — S065XAA Traumatic subdural hemorrhage with loss of consciousness status unknown, initial encounter: Secondary | ICD-10-CM | POA: Diagnosis not present

## 2021-11-19 DIAGNOSIS — G301 Alzheimer's disease with late onset: Secondary | ICD-10-CM | POA: Diagnosis not present

## 2021-11-19 DIAGNOSIS — E119 Type 2 diabetes mellitus without complications: Secondary | ICD-10-CM | POA: Diagnosis not present

## 2021-11-19 DIAGNOSIS — I5042 Chronic combined systolic (congestive) and diastolic (congestive) heart failure: Secondary | ICD-10-CM | POA: Diagnosis not present

## 2021-11-19 LAB — GLUCOSE, CAPILLARY
Glucose-Capillary: 88 mg/dL (ref 70–99)
Glucose-Capillary: 126 mg/dL — ABNORMAL HIGH (ref 70–99)
Glucose-Capillary: 68 mg/dL — ABNORMAL LOW (ref 70–99)
Glucose-Capillary: 93 mg/dL (ref 70–99)
Glucose-Capillary: 66 mg/dL — ABNORMAL LOW (ref 70–99)
Glucose-Capillary: 86 mg/dL (ref 70–99)

## 2021-11-19 MED ORDER — METFORMIN HCL 500 MG PO TABS
500.0000 mg | ORAL_TABLET | Freq: Two times a day (BID) | ORAL | 0 refills | Status: DC
Start: 2021-11-19 — End: 2022-01-07

## 2021-11-19 MED ORDER — POLYETHYLENE GLYCOL 3350 17 G PO PACK: 17.0000 g | PACK | Freq: Every day | ORAL | 0 refills | Status: DC | PRN

## 2021-11-19 MED ORDER — VITAMIN D3 25 MCG (1000 UNIT) PO TABS: 1000.0000 [IU] | ORAL_TABLET | Freq: Every day | ORAL | 0 refills | Status: DC

## 2021-11-19 MED ORDER — OXYBUTYNIN CHLORIDE 5 MG PO TABS: 5.0000 mg | ORAL_TABLET | Freq: Two times a day (BID) | ORAL | 0 refills | Status: DC

## 2021-11-19 MED ORDER — FINASTERIDE 5 MG PO TABS: 5.0000 mg | ORAL_TABLET | Freq: Every day | ORAL | 0 refills | Status: DC

## 2021-11-19 MED ORDER — HYDROCODONE-ACETAMINOPHEN 5-325 MG PO TABS: 1.0000 | ORAL_TABLET | ORAL | 0 refills | Status: DC | PRN

## 2021-11-19 MED ORDER — TRAZODONE HCL 50 MG PO TABS: 50.0000 mg | ORAL_TABLET | Freq: Every day | ORAL | 0 refills | Status: DC

## 2021-11-19 MED ORDER — ATORVASTATIN CALCIUM 80 MG PO TABS: 40.0000 mg | ORAL_TABLET | Freq: Every evening | ORAL | 0 refills | Status: DC

## 2021-11-19 MED ORDER — MAGNESIUM GLUCONATE 500 MG PO TABS: 250.0000 mg | ORAL_TABLET | Freq: Every day | ORAL | 0 refills | Status: DC

## 2021-11-19 MED ORDER — ACETAMINOPHEN 325 MG PO TABS: 325.0000 mg | ORAL_TABLET | ORAL | Status: DC | PRN

## 2021-11-19 MED ORDER — CEPHALEXIN 500 MG PO CAPS: 500.0000 mg | ORAL_CAPSULE | Freq: Three times a day (TID) | ORAL | 0 refills | Status: DC

## 2021-11-19 MED ORDER — LORATADINE 10 MG PO TABS: 10.0000 mg | ORAL_TABLET | Freq: Every day | ORAL | 0 refills | Status: DC

## 2021-11-19 NOTE — Progress Notes (Signed)
Speech Language Pathology Discharge Summary  Patient Details  Name: Kevin [name] MRN: [id number] Date of Birth: 09-06-1946  Today's Date: 11/19/2021 SLP Individual Time: 1500-1520 SLP Individual Time Calculation (min): 20 min   Skilled Therapeutic Interventions:  Patient seen with wife present for discharge education. SLP discussed importance of 24 hour supervision as patient has h/o cognitive deficits related to dementia and in addition, he is going home with new precautions (TED hose and abdominal binder secondary to BP drop). His spouse demonstrated understanding and reported that she plans to get bed alarm for at home in case he gets up at night. She feels that he is at or near baseline cognition at this time. Patient and spouse both reported understanding of recommendations.     Patient has met 5 of 5 long term goals.  Patient to discharge at overall Supervision;Min level.  Reasons goals not met: N/A   Clinical Impression/Discharge Summary: Patient met all 5 LTG's and appears to be functioning at or very near cognitive baseline as per chart review from recent previous admission and per discussion with wife. At time of discharge, patient requiring supervision at all times and minA with basic level problem solving and daily recall, time orientation. His wife is planning to put alarm for when he gets out of bed as patient does not appear able to consistently recall precautions. [place] SLP intervention recommended upon discharge home with family, but continue to recommend 24 hour supervision.  Care Partner:  Caregiver Able to Provide Assistance: Yes  Type of Caregiver Assistance: Physical;Cognitive  Recommendation:  24 hour supervision/assistance;Home Health SLP  Rationale for SLP Follow Up: Maximize cognitive function and independence;Reduce caregiver burden   Equipment: None for ST   Reasons for discharge: Discharged from hospital;Treatment goals met   Patient/Family Agrees with  Progress Made and Goals Achieved: Yes   Sonia Baller, MA, CCC-SLP Speech Therapy

## 2021-11-19 NOTE — Progress Notes (Signed)
Physical Therapy Discharge Summary  Patient Details  Name: Kevin [name] MRN: [id number] Date of Birth: 19-Feb-1946  Today's Date: 11/19/2021  Patient has met 8 of 8 long term goals due to improved activity tolerance, improved balance, improved postural control, ability to compensate for deficits, improved attention, improved awareness, and improved coordination.  Patient to discharge at an ambulatory level Supervision using a RW.   Patient's care partner is independent to provide the necessary cognitive assistance at discharge.  Reasons goals not met: All PT goals met  Recommendation:  Patient will benefit from ongoing skilled PT services in home health setting to continue to advance safe functional mobility, address ongoing impairments in balance, activity tolerance, postural control, functional mobility, gait and stair training, strength, community integration, patient/caregiver education, and minimize fall risk.  Equipment: No equipment provided, patient has RW, recommending use of RW with all mobility for safety due to fall risk.  Reasons for discharge: treatment goals met  Patient/family agrees with progress made and goals achieved: Yes  PT Discharge Precautions/Restrictions Precautions Precautions: Fall Precaution Comments: orthostatic hypotension, wear thigh high TED hose and abdominal binder with mobility Restrictions RLE Weight Bearing: Weight bearing as tolerated Pain Interference Pain Interference Pain Effect on Sleep: 1. Rarely or not at all Pain Interference with Therapy Activities: 1. Rarely or not at all Pain Interference with Day-to-Day Activities: 1. Rarely or not at all Vision/Perception  Vision - History Ability to See in Adequate Light: 1 Impaired (reading only) Vision - Assessment Eye Alignment: Within Functional Limits Alignment/Gaze Preference: Within Defined Limits Tracking/Visual Pursuits: Decreased smoothness of eye movement to LEFT inferior  field;Decreased smoothness of eye movement to RIGHT inferior field Saccades: Additional eye shifts occurred during testing Convergence: Within functional limits Perception Perception: Within Functional Limits Praxis Praxis: Intact  Cognition Overall Cognitive Status: Impaired/Different from baseline Arousal/Alertness: Awake/alert Orientation Level: Oriented X4 Year: 2023 Month: January Day of Week: Incorrect Attention: Selective Selective Attention: Impaired Memory: Impaired Memory Impairment: Storage deficit;Decreased recall of new information Immediate Memory Recall: Sock;Bed;Blue Memory Recall Sock: Not able to recall Memory Recall Blue: With Cue Memory Recall Bed: Not able to recall Awareness: Impaired Problem Solving: Impaired Executive Function:  (all impaired) Safety/Judgment: Impaired Comments: Per chart, Alzheimer's at baseline Sensation Sensation Light Touch: Appears Intact Proprioception: Appears Intact Coordination Gross Motor Movements are Fluid and Coordinated: No Fine Motor Movements are Fluid and Coordinated: Yes Coordination and Movement Description: generalized deconditioning, limited by orthostatic hypotension Heel Shin Test: [facility] Motor  Motor Motor: Abnormal postural alignment and control Motor - Discharge Observations: Global weakness, decreased postural control and balance strategies  Mobility Bed Mobility Rolling Right: Independent Rolling Left: Independent Supine to Sit: Independent Sit to Supine: Independent Transfers Sit to Stand: Supervision/Verbal cueing Stand to Sit: Supervision/Verbal cueing Stand Pivot Transfers: Supervision/Verbal cueing Stand Pivot Transfer Details: Verbal cues for safe use of DME/AE;Verbal cues for precautions/safety Transfer (Assistive device): Rolling walker Locomotion  Gait Gait Assistance: Supervision/Verbal cueing Assistive device: Rolling walker Gait Assistance Details: Verbal cues for technique;Verbal  cues for safe use of DME/AE Gait Gait Pattern: Impaired Gait Pattern: Left flexed knee in stance;Right flexed knee in stance;Narrow base of support;Decreased stride length;Trunk flexed;Decreased trunk rotation Gait velocity: decreased Stairs / Additional Locomotion Stairs: Yes Height of Stairs: 6 Wheelchair Mobility Wheelchair Mobility: No  Trunk/Postural Assessment  Cervical Assessment Cervical Assessment: Exceptions to [facility] (forward head) Thoracic Assessment Thoracic Assessment: Exceptions to [facility] (rounded shoulders, kyphotic posture) Lumbar Assessment Lumbar Assessment: Exceptions to [facility] (posterior pelvic tilt) Postural Control  Postural Control: Deficits on evaluation Righting Reactions: Decreased Protective Responses: Decreased  Balance Balance Balance Assessed: Yes Static Sitting Balance Static Sitting - Balance Support: Feet supported Static Sitting - Level of Assistance: 7: Independent Dynamic Sitting Balance Dynamic Sitting - Balance Support: Feet supported Dynamic Sitting - Level of Assistance: 5: Stand by assistance Static Standing Balance Static Standing - Balance Support: During functional activity;No upper extremity supported Static Standing - Level of Assistance: 5: Stand by assistance Dynamic Standing Balance Dynamic Standing - Balance Support: During functional activity;Right upper extremity supported;Left upper extremity supported Dynamic Standing - Level of Assistance: 5: Stand by assistance Extremity Assessment  RLE Assessment RLE Assessment: Within Functional Limits General Strength Comments: Grossly 4+ to 5/5 throughout LLE Assessment LLE Assessment: Within Functional Limits General Strength Comments: Grossly 4+ to 5/5 througout    Arika Mainer L Merril Isakson PT, DPT  11/19/2021, 6:37 PM

## 2021-11-19 NOTE — Plan of Care (Signed)
Problem: RH Balance Goal: LTG Patient will maintain dynamic standing with ADLs (OT) Description: LTG:  Patient will maintain dynamic standing balance with assist during activities of daily living (OT)  Outcome: Completed/Met   Problem: Sit to Stand Goal: LTG:  Patient will perform sit to stand in prep for activites of daily living with assistance level (OT) Description: LTG:  Patient will perform sit to stand in prep for activites of daily living with assistance level (OT) Outcome: Completed/Met   Problem: RH Grooming Goal: LTG Patient will perform grooming w/assist,cues/equip (OT) Description: LTG: Patient will perform grooming with assist, with/without cues using equipment (OT) Outcome: Completed/Met   Problem: RH Bathing Goal: LTG Patient will bathe all body parts with assist levels (OT) Description: LTG: Patient will bathe all body parts with assist levels (OT) Outcome: Completed/Met   Problem: RH Dressing Goal: LTG Patient will perform upper body dressing (OT) Description: LTG Patient will perform upper body dressing with assist, with/without cues (OT). Outcome: Completed/Met Goal: LTG Patient will perform lower body dressing w/assist (OT) Description: LTG: Patient will perform lower body dressing with assist, with/without cues in positioning using equipment (OT) Outcome: Completed/Met   Problem: RH Toileting Goal: LTG Patient will perform toileting task (3/3 steps) with assistance level (OT) Description: LTG: Patient will perform toileting task (3/3 steps) with assistance level (OT)  Outcome: Completed/Met   Problem: RH Toilet Transfers Goal: LTG Patient will perform toilet transfers w/assist (OT) Description: LTG: Patient will perform toilet transfers with assist, with/without cues using equipment (OT) Outcome: Completed/Met   Problem: RH Toilet Transfers Goal: LTG Patient will perform toilet transfers w/assist (OT) Description: LTG: Patient will perform toilet  transfers with assist, with/without cues using equipment (OT) Outcome: Completed/Met   Problem: RH Tub/Shower Transfers Goal: LTG Patient will perform tub/shower transfers w/assist (OT) Description: LTG: Patient will perform tub/shower transfers with assist, with/without cues using equipment (OT) Outcome: Completed/Met   Problem: RH Awareness Goal: LTG: Patient will demonstrate awareness during functional activites type of (OT) Description: LTG: Patient will demonstrate awareness during functional activites type of (OT) Outcome: Completed/Met

## 2021-11-19 NOTE — Progress Notes (Signed)
PROGRESS NOTE   Subjective/Complaints:  Pt up with PT. Feeling a little dizzy. Bp soft.  Still consistently requiring caths  ROS: Patient denies fever, rash, sore throat, blurred vision, nausea, vomiting, diarrhea, cough, shortness of breath or chest pain, joint or back pain, headache, or mood change.     Objective:   No results found. No results for input(s): WBC, HGB, HCT, PLT in the last 72 hours.   No results for input(s): NA, K, CL, CO2, GLUCOSE, BUN, CREATININE, CALCIUM in the last 72 hours.   Intake/Output Summary (Last 24 hours) at 11/19/2021 1056 Last data filed at 11/19/2021 0828 Gross per 24 hour  Intake 600 ml  Output 2050 ml  Net -1450 ml        Physical Exam: Vital Signs Blood pressure (!) 108/59, pulse 83, temperature 97.7 F (36.5 C), temperature source Oral, resp. rate 16, height 6\' 4"  (1.93 m), weight 66.3 kg, SpO2 96 %.  Constitutional: No distress . Vital signs reviewed. HEENT: NCAT, EOMI, oral membranes moist Neck: supple Cardiovascular: RRR without murmur. No JVD    Respiratory/Chest: CTA Bilaterally without wheezes or rales. Normal effort    GI/Abdomen: BS +, non-tender, non-distended Ext: no clubbing, cyanosis, or edema Psych: pleasant and cooperative   Skin:    Comments: Lesion on right scalp   Neurological:     Mental Status: awake and alert     Comments: oriented to person, place, month/year. Fair awareness. Normal language. Follows all commands.  STM deficits. Cranial nerves II through XII intact, 5/5 strength in bilateral upper extremities.  3+/5 RIght lower ext prox to 4/5 distal, 5/5 LLE. exam stable  Assessment/Plan: 1. Functional deficits which require 3+ hours per day of interdisciplinary therapy in a comprehensive inpatient rehab setting. Physiatrist is providing close team supervision and 24 hour management of active medical problems listed below. Physiatrist and rehab team  continue to assess barriers to discharge/monitor patient progress toward functional and medical goals  Care Tool:  Bathing    Body parts bathed by patient: Right arm, Right lower leg, Left arm, Left lower leg, Chest, Abdomen, Face, Front perineal area, Buttocks, Right upper leg, Left upper leg         Bathing assist Assist Level: Minimal Assistance - Patient > 75%     Upper Body Dressing/Undressing Upper body dressing   What is the patient wearing?: Pull over shirt    Upper body assist Assist Level: Set up assist    Lower Body Dressing/Undressing Lower body dressing      What is the patient wearing?: Pants     Lower body assist Assist for lower body dressing: Set up assist     Toileting Toileting    Toileting assist Assist for toileting: Contact Guard/Touching assist     Transfers Chair/bed transfer  Transfers assist  Chair/bed transfer activity did not occur: Safety/medical concerns  Chair/bed transfer assist level: Contact Guard/Touching assist Chair/bed transfer assistive device: Programmer, multimedia   Ambulation assist      Assist level: Contact Guard/Touching assist Assistive device: Walker-rolling Max distance: 150'   Walk 10 feet activity   Assist     Assist level: Contact  Guard/Touching assist Assistive device: Walker-rolling   Walk 50 feet activity   Assist    Assist level: Contact Guard/Touching assist Assistive device: Walker-rolling    Walk 150 feet activity   Assist    Assist level: Contact Guard/Touching assist Assistive device: Walker-rolling    Walk 10 feet on uneven surface  activity   Assist Walk 10 feet on uneven surfaces activity did not occur: Safety/medical concerns         Wheelchair     Assist Is the patient using a wheelchair?: No             Wheelchair 50 feet with 2 turns activity    Assist            Wheelchair 150 feet activity     Assist           Blood pressure (!) 108/59, pulse 83, temperature 97.7 F (36.5 C), temperature source Oral, resp. rate 16, height 6\' 4"  (1.93 m), weight 66.3 kg, SpO2 96 %.    Medical Problem List and Plan: 1. Functional deficits secondary to right SDH after fall             -patient may shower             -ELOS/Goals: 1/13, Sup goals           -Continue CIR therapies including PT, OT, and SLP   2.  Antithrombotics: -DVT/anticoagulation:  Mechanical: Sequential compression devices, below knee Bilateral lower extremities             -antiplatelet therapy: Off Plavix due to hematuria.  3. Pain Management: tylenol prn.   1/12- Denies pain- con't regimen 4. Mood: LCSW to follow for evaluation and support.              -antipsychotic agents: N/A  5. Neuropsych: This patient maybe intermittently capable of making decisions on his own behalf. 6. Skin/Wound Care: Routine pressure relief measures.  7. Fluids/Electrolytes/Nutrition:  BUN/Cr wnl recently  -received IVF this past weekend 8. Prostate cancer/Urinary retention: Monitor for hematuria.              -On Proscar with ditropan for bladder spasms.  1/6 foley out and PVR's have typically been less than 300cc  -I/O cath if needed  -stand to void when possible 1/7- I/O caths prn 1/12 ucx with 100k klebsiella. Sensitive to keflex started 1/8  Continue OOB to void, stand when possible  -dc ditropan  -discussed options for home with patient and he will likely go home with foley 9. T2DM: Hgb A 1C-  Monitor BS ac/hs. Use SSI for elevated BS.              --increased metformin to 500mg  BID CBG (last 3)  Recent Labs    11/18/21 1655 11/18/21 2105 11/19/21 0622  GLUCAP 76 109* 88     1/12 cbg's controlled 10. Chronic combined systolic/diastolic CHF: Daily weights with low salt diet.  --Monitor for signs of overload.  Filed Weights   11/13/21 0419 11/14/21 0500 11/16/21 0500  Weight: 66.3 kg 62.9 kg 66.3 kg   -need a weight today 1/12!Marland Kitchen 11.  Alzheimer's dementia: Followed by Dr. Delice Lesch. Continue Namenda and Aricept             --Wellbutrin for anger management.              --used ativan prn for anxiety issues.  12. Underweight/cachexia-  BMI 17.98: encourage healthy diet.  13. Constipation.  moved bowels 1/11--pt moved bowels prior to sorbitol     14. Insomnia  1/4-  scheduled Trazodone 50 mg QHS nightly- --improved 15. Hypotension/near syncope    1/12-need to continue pushing fluids -THT's and abd binder.  -labs wnl   -hold magnesium.    At least 35 total minutes were spent in examination of patient, assessment of pertinent data,  formulation of a treatment plan, and in discussion with patient and/or family.      LOS: 9 days A FACE TO FACE EVALUATION WAS PERFORMED  Meredith Staggers 11/19/2021, 10:56 AM

## 2021-11-19 NOTE — Progress Notes (Signed)
Physical Therapy TBI Note  Patient Details  Name: Kevin [name] MRN: [id number] Date of Birth: 1946/10/21  Today's Date: 11/19/2021 PT Individual Time: [id number] PT Individual Time Calculation (min): 30 min and 35 min   Short Term Goals: Week 1:  PT Short Term Goal 1 (Week 1): = LTG due to ELOS  Skilled Therapeutic Interventions/Progress Updates:     Session 1: Patient in bed upon PT arrival. Patient alert and agreeable to PT session. Patient denied pain during session.  Focused session on patient education on BP management at home with interventions and mobility.   Orthostatic Vitals: Supine: BP 123/76, HR 86  Sitting: BP 111/63, HR 91 Standing: BP 93/70, HR 106 Standing x3 min: BP 92/73, HR 112 Following Gait: BP 97/71, HR 11 Following Stairs: BP 100/71, HR 108  Therapeutic Activity: Bed Mobility: Donned B thigh high TED hose with total A bed level. Educated on having his wife don compression socks prior to sitting up in the morning to improved BP with change in position. Patient performed supine to sit with supervision in a flat bed without use of bed rails.  Patient sat EOB independently with mild dizziness that resolved <30 sec in sitting. Patient doffed hospital gown with min A for untying the back, donned a pull-over shirt with set-up assist. He donned pants with mod A to thread lower extremities through, and tennis shoes with total A for time management. PT donned the abdominal binder with total A to ensure proper placement and snug fit for BP control. Patient performed seated exercises, see below, before standing to improve BP management before transfers. Transfers: Patient performed sit to/from stand x5 with supervision using RW. Provided verbal cues for hand placement on RW, forward weight shift, and reaching back to sit for safety.  Gait Training:  Patient ambulated 62 feet, >100 feet, and >150 feet using RW with close supervision. Ambulated with decreased  gait speed, decreased step length and height, increased B hip and knee flexion in stance, mild forward trunk lean, and reduced visual scanning to the R. Provided verbal cues for erect posture, safe proximity to RW, reduced gait speed and increased awareness on turns, external targets for visual scanning to the R, and instructed patient to use path finding strategies to locate his room on last gait trial with min affirming cues. Patient ascended/descended 8x6" steps using B rails with CGA for steadying support. Performed reciprocal gait pattern throughout. Provided cues for technique and sequencing.  Patient ascended/descended 1x8" step using RW with CGA. Performed step-to gait pattern leading with L while ascending and R while descending. Provided cues for technique and sequencing.   Therapeutic Exercise: Patient performed the following exercises with verbal and tactile cues for proper technique. -seated reciprocal marching and opposite shoulder flexion 2x30 sec -standing marching 2x30 sec  Patient in w/c in the room at end of session with breaks locked, seat belt alarm set, and all needs within reach.   Session 2: Patient in bed with his wife and friends present upon PT arrival. Patient's friends had just arrived from out of town and patient requested 10 min to visit with them prior to family education session with his wife.   PT returned in 10 min. Patient alert and agreeable to PT session. Patient denied pain during session.  Patient's wife participated in PT recommendations for d/c, provided handout (see below), performed transfers, gait, and single step, as above, and car transfer with close supervision, using a RW for all mobility with patient's  wife providing safe guarding technique and CGA x1 for minor LOB appropriately. Patient's wife provided appropriate cues to patient for all mobility.   Educated patient and his wife on fall risk/prevention, home modifications to prevent falls, and  activation of emergency services in the event of a fall during session.   Physical Therapy Recommendations (handout): BP Management: -Put on compression stockings in bed before sitting up.  -Put on abdominal binder around his waist in sitting. -Sit up for several minutes on the edge of the bed, perform seated exercises. -Stand for 1 min, perform 2 sets of marching in standing. -Sit down if dizziness does not go away.  -Only start to walk if dizziness resolves. -Check BP when dizziness persists longer than 2 min. -Stand up at least every hour when sitting and awake. *BP less than 90/60, sit down and elevate lower extremities lying on the bed or in the recliner. -Have someone with you within arm's reach when walking at home or in the community -Use your walker at all times when walking for safety, follow-up with the doctor to determine when to stop using RW -Do not drive any motorized vehicle at this time due to decreased reaction time and awareness since your traumatic brain injury -Remove all guns, weapons, and power tools from the home, or store safety out of reach -Have someone provide 24/7 supervision when at home initially for safety -Have someone with you on any stairs, use rails when able, take one step at a time (up with left, down with R), person assisting should have a hand on your waist or holding the gait belt to prevent falls, and they should stand on the downhill side (behind you when going up and in front of you going down) -Pull up any throw rugs to prevent falls -Follow-up with primary physician and report any falls  -Call emergency services ([phone]) in the event of a fall to assist with getting up and to assess need for medical attention -Keep cell phone in your pocket at all times -Perform Home Exercise Program 3x per day, progress with ankle weights  Patient sitting EOB with his wife at bedside at end of session, with breaks locked, bed alarm set, and all needs in reach.    Therapy Documentation Precautions:  Precautions Precautions: Fall Precaution Comments: orthostatic hypotension, wear thigh high TED hose and abdominal binder with mobility Restrictions Weight Bearing Restrictions: No RLE Weight Bearing: Weight bearing as tolerated Agitated Behavior Scale: TBI Observation Details Observation Environment: CIR Start of observation period - Date: 11/19/21 Start of observation period - Time: 0830 End of observation period - Date: 11/19/21 End of observation period - Time: 0930 Agitated Behavior Scale (DO NOT LEAVE BLANKS) Short attention span, easy distractibility, inability to concentrate: Absent Impulsive, impatient, low tolerance for pain or frustration: Absent Uncooperative, resistant to care, demanding: Absent Violent and/or threatening violence toward people or property: Absent Explosive and/or unpredictable anger: Absent Rocking, rubbing, moaning, or other self-stimulating behavior: Absent Pulling at tubes, restraints, etc.: Absent Wandering from treatment areas: Absent Restlessness, pacing, excessive movement: Absent Repetitive behaviors, motor, and/or verbal: Absent Rapid, loud, or excessive talking: Absent Sudden changes of mood: Absent Easily initiated or excessive crying and/or laughter: Absent Self-abusiveness, physical and/or verbal: Absent Agitated behavior scale total score: 14 Agitated Behavior Scale: TBI Observation Details Observation Environment: CIR Start of observation period - Date: 11/19/21 Start of observation period - Time: 1310 End of observation period - Date: 11/19/21 End of observation period - Time: 1345 Agitated Behavior  Scale (DO NOT LEAVE BLANKS) Short attention span, easy distractibility, inability to concentrate: Absent Impulsive, impatient, low tolerance for pain or frustration: Absent Uncooperative, resistant to care, demanding: Absent Violent and/or threatening violence toward people or property:  Absent Explosive and/or unpredictable anger: Absent Rocking, rubbing, moaning, or other self-stimulating behavior: Absent Pulling at tubes, restraints, etc.: Absent Wandering from treatment areas: Absent Restlessness, pacing, excessive movement: Absent Repetitive behaviors, motor, and/or verbal: Absent Rapid, loud, or excessive talking: Absent Sudden changes of mood: Absent Easily initiated or excessive crying and/or laughter: Absent Self-abusiveness, physical and/or verbal: Absent Agitated behavior scale total score: 14    Therapy/Group: Individual Therapy  Zuzanna Maroney L Hal Norrington PT, DPT  11/19/2021, 4:09 PM

## 2021-11-19 NOTE — Progress Notes (Signed)
Physical Therapy Session Note  Patient Details  Name: Kevin [name] MRN: [id number] Date of Birth: 25-Aug-1946  Today's Date: 11/19/2021 PT Individual Time: 1030-1110 PT Individual Time Calculation (min): 40 min   Short Term Goals: Week 1:  PT Short Term Goal 1 (Week 1): = LTG due to ELOS  Skilled Therapeutic Interventions/Progress Updates: Pt presented in w/c agreeable to therapy. Pt denies pain but states increased fatigue and requesting to return to bed. Pt agreeable to therapy then return to bed. Pt denies pain during session. BP checked prior to ambulation: 98/85 (92) HR 100. Pt ambulated to day room CGA fading to close S with RW. Pt transferred to NuStep and participated in L4 x 8 min maintaining ~50-60 SPM for general conditioning. BP checked after activity: 103/68 (80) HR 108. No c/o pain with activity. Pt then ambulated back to room and doffed shoes mod I at EOB. Once completed transferred to supine flat bed mod I. Pt repositioned to comfort and left with bed alarm on, call bell within reach and needs met.      Therapy Documentation Precautions:  Precautions Precautions: Fall Precaution Comments: orthostatic hypotension, wear thigh high TED hose and abdominal binder with mobility Restrictions Weight Bearing Restrictions: No RLE Weight Bearing: Weight bearing as tolerated General:   Vital Signs:  Pain: Pain Assessment Pain Scale: 0-10 Pain Score: 7  Pain Type: Acute pain Pain Location: Head Pain Orientation: Anterior Pain Descriptors / Indicators: Headache Pain Frequency: Intermittent Pain Onset: On-going Patients Stated Pain Goal: 0 Pain Intervention(s): Medication (See eMAR) Mobility:   Locomotion :    Trunk/Postural Assessment :    Balance:   Exercises:   Other Treatments:      Therapy/Group: Individual Therapy  Kemiya Batdorf 11/19/2021, 11:14 AM

## 2021-11-19 NOTE — Progress Notes (Signed)
Occupational Therapy Discharge Summary  Patient Details  Name: JAMAI DOLCE MRN: [id number] Date of Birth: 01-31-1946  Today's Date: 11/19/2021 OT Individual Time: 1415-1500 OT Individual Time Calculation (min): 45 min   Pt greeted semi-reclined in bed with spouse present for family education.  OT educated on importance of having TED hose and abdominal binder on within BADL tasks. OT educated on use of friction reducing bag to don TED hose with family demonstrating understanding. Educated on wearing TED hose to shower and alternating TED Hose to dry out. Also discussed leaving abdominal binder on until seated on shower seat, then remove and replace after shower prior to standing. Lateral leans recommended for washing buttock. Pt's spouse already has purchased a shower seat and hand held shower hose. Discussed high fall risk and need for 24/7 supervision. Pt completed bed mobility and ambulated to wc in room with RW and verbal cues for safety and proximity to RW. Had spouse practice with gait belt and suggested gait belt be on for all mobility. Pt brought to apartment and practiced walk in shower transfer in simulated home environment. Dicussed different techniques and had spouse provide CGA. Pt returned to room and left seated in wc with alarm belt on, call bell in reach, and spouse present.   Patient has met 10 of 10 long term goals due to improved activity tolerance, improved balance, postural control, ability to compensate for deficits, improved attention, and improved awareness.  Patient to discharge at overall Supervision level.  Patient's care partner is independent to provide the necessary physical and cognitive assistance at discharge.  Patient needs supervision due to high fall risk, and CGA for shower transfers, again due to high fall risk. Pt also needs to have BP external supports with TED hose and binder on at all times when getting up.   Reasons goals not met: n/a  Recommendation:   Patient will benefit from ongoing skilled OT services in home health setting to continue to advance functional skills in the area of BADL and Reduce care partner burden.  Equipment: No equipment provided, pt already has all DME needs from previous admission.  Reasons for discharge: treatment goals met and discharge from hospital  Patient/family agrees with progress made and goals achieved: Yes  OT Discharge Precautions/Restrictions  Precautions Precautions: Fall Precaution Comments: orthostatic hypotension, wear thigh high TED hose and abdominal binder with mobility Restrictions Weight Bearing Restrictions: No RLE Weight Bearing: Weight bearing as tolerated Pain Denies pain ADL ADL Eating: Modified independent Grooming: Modified independent Upper Body Bathing: Supervision/safety Lower Body Bathing: Supervision/safety Upper Body Dressing: Supervision/safety Lower Body Dressing: Supervision/safety Toileting: Supervision/safety Where Assessed-Toileting: Glass blower/designer: Close supervision Toilet Transfer Method: Counselling psychologist: Raised toilet seat, Grab bars Vision Eye Alignment: Within Functional Limits Alignment/Gaze Preference: Within Defined Limits Tracking/Visual Pursuits: Decreased smoothness of eye movement to LEFT inferior field;Decreased smoothness of eye movement to RIGHT inferior field Saccades: Additional eye shifts occurred during testing Convergence: Within functional limits Perception  Perception: Within Functional Limits Praxis Praxis: Intact Cognition Overall Cognitive Status: Impaired/Different from baseline Arousal/Alertness: Awake/alert Orientation Level: Oriented X4 Year: 2023 Month: January Day of Week: Incorrect Attention: Selective Selective Attention: Impaired Selective Attention Impairment: Verbal basic;Verbal complex Memory: Impaired Memory Impairment: Storage deficit;Decreased recall of new  information Decreased Short Term Memory: Verbal basic;Functional basic Immediate Memory Recall: Sock;Bed;Blue Memory Recall Sock: Not able to recall Memory Recall Blue: With Cue Memory Recall Bed: Not able to recall Awareness: Impaired Awareness Impairment: Intellectual impairment Problem Solving: Impaired  Problem Solving Impairment: Verbal complex Executive Function:  (all impaired) Safety/Judgment: Impaired Comments: Per chart, Alzheimer's at baseline Sensation Sensation Light Touch: Appears Intact Proprioception: Appears Intact Coordination Gross Motor Movements are Fluid and Coordinated: No Fine Motor Movements are Fluid and Coordinated: Yes Coordination and Movement Description: generalized deconditioning, hand function/coordination WFL Finger Nose Finger Test: WNL bilaterally Motor  Motor Motor - Discharge Observations: Global weakness, decreased postural control and balance strategies, improved since eval Mobility  Bed Mobility Rolling Right: Independent Rolling Left: Independent Supine to Sit: Independent Sit to Supine: Independent Transfers Sit to Stand: Supervision/Verbal cueing Stand to Sit: Supervision/Verbal cueing  Trunk/Postural Assessment     Balance Balance Balance Assessed: Yes Static Sitting Balance Static Sitting - Balance Support: Feet supported;Bilateral upper extremity supported Static Sitting - Level of Assistance: 5: Stand by assistance Dynamic Sitting Balance Dynamic Sitting - Balance Support: Feet supported Dynamic Sitting - Level of Assistance: 5: Stand by assistance Static Standing Balance Static Standing - Balance Support: During functional activity Static Standing - Level of Assistance: 5: Stand by assistance Dynamic Standing Balance Dynamic Standing - Balance Support: During functional activity Dynamic Standing - Level of Assistance: 5: Stand by assistance Extremity/Trunk Assessment RUE Assessment RUE Assessment: Within Functional  Limits Active Range of Motion (AROM) Comments: WNL LUE Assessment LUE Assessment: Within Functional Limits Active Range of Motion (AROM) Comments: WNL    11/19/21 1500  Observation Details  Observation Environment CIR  Start of observation period - Date 11/19/21  Start of observation period - Time 1415  End of observation period - Date 11/19/21  End of observation period - Time 1500  Agitated Behavior Scale (DO NOT LEAVE BLANKS)  Short attention span, easy distractibility, inability to concentrate 1  Impulsive, impatient, low tolerance for pain or frustration 1  Uncooperative, resistant to care, demanding 1  Violent and/or threatening violence toward people or property 1  Explosive and/or unpredictable anger 1  Rocking, rubbing, moaning, or other self-stimulating behavior 1  Pulling at tubes, restraints, etc. 1  Wandering from treatment areas 1  Restlessness, pacing, excessive movement 1  Repetitive behaviors, motor, and/or verbal 1  Rapid, loud, or excessive talking 1  Sudden changes of mood 1  Easily initiated or excessive crying and/or laughter 1  Self-abusiveness, physical and/or verbal 1  Agitated behavior scale total score 14      Daneen Schick Whalen Trompeter 11/19/2021, 3:07 PM

## 2021-11-19 NOTE — Progress Notes (Addendum)
Hypoglycemic Event  CBG: 66   Treatment: 4 oz juice/soda  Symptoms: None  Follow-up CBG: Time:2259 CBG Result:86   Possible Reasons for Event: Unknown  Comments/MD notified: Notified Md during morning rounds     Kevin [name]

## 2021-11-19 NOTE — Progress Notes (Signed)
Inpatient Rehabilitation Discharge Medication Review by a Pharmacist  A complete drug regimen review was completed for this patient to identify any potential clinically significant medication issues.  High Risk Drug Classes Is patient taking? Indication by Medication  Antipsychotic No   Anticoagulant No   Antibiotic Yes Keflex for UTI  Opioid Yes Norco for severe pain  Antiplatelet No   Hypoglycemics/insulin Yes Metformin for DM,   Vasoactive Medication No   Chemotherapy No   Other No      Clinically significant medication issues were identified that warrant physician communication and completion of prescribed/recommended actions by midnight of the next day:  Yes  Contacted: Marlowe Shores, PA Method of contact: Chat Pharmacist comments: d/c Ditropan noted by MD today, home with foley. D/c Ditropan from discharge meds>>done. No further f/u required.  Time spent performing this drug regimen review (minutes):  83min  Holland Nickson S. Alford [place], PharmD, BCPS Clinical Staff Pharmacist [email or web address] Eilene Ghazi Stillinger 11/19/2021 12:00 PM

## 2021-11-20 DIAGNOSIS — R339 Retention of urine, unspecified: Secondary | ICD-10-CM | POA: Diagnosis not present

## 2021-11-20 DIAGNOSIS — S065XAA Traumatic subdural hemorrhage with loss of consciousness status unknown, initial encounter: Secondary | ICD-10-CM | POA: Diagnosis not present

## 2021-11-20 DIAGNOSIS — E119 Type 2 diabetes mellitus without complications: Secondary | ICD-10-CM | POA: Diagnosis not present

## 2021-11-20 DIAGNOSIS — G479 Sleep disorder, unspecified: Secondary | ICD-10-CM | POA: Diagnosis not present

## 2021-11-20 LAB — GLUCOSE, CAPILLARY: Glucose-Capillary: 90 mg/dL (ref 70–99)

## 2021-11-20 NOTE — Progress Notes (Signed)
PROGRESS NOTE   Subjective/Complaints:  Pt in bed. No new issues. Happy to be getting home today. Still requiring I/O caths pretty regularly  ROS: Patient denies fever, rash, sore throat, blurred vision, nausea, vomiting, diarrhea, cough, shortness of breath or chest pain, joint or back pain, headache, or mood change.     Objective:   No results found. No results for input(s): WBC, HGB, HCT, PLT in the last 72 hours.   No results for input(s): NA, K, CL, CO2, GLUCOSE, BUN, CREATININE, CALCIUM in the last 72 hours.   Intake/Output Summary (Last 24 hours) at 11/20/2021 1135 Last data filed at 11/20/2021 0800 Gross per 24 hour  Intake 960 ml  Output 1101 ml  Net -141 ml        Physical Exam: Vital Signs Blood pressure 126/73, pulse 85, temperature 98.1 F (36.7 C), temperature source Oral, resp. rate 16, height 6\' 4"  (1.93 m), weight 61.1 kg, SpO2 95 %.  Constitutional: No distress . Vital signs reviewed. HEENT: NCAT, EOMI, oral membranes moist Neck: supple Cardiovascular: RRR without murmur. No JVD    Respiratory/Chest: CTA Bilaterally without wheezes or rales. Normal effort    GI/Abdomen: BS +, non-tender, non-distended Ext: no clubbing, cyanosis, or edema Psych: pleasant and cooperative   Skin:    Comments: Lesion on right scalp   Neurological:     Mental Status: awake and alert     Comments: oriented to person, place, month/year. Fair awareness. Normal language. Follows all commands.  STM deficits. Cranial nerves II through XII intact, 5/5 strength in bilateral upper extremities.  3+ to 4/5 RIght lower ext prox to 4/5 distal, 5/5 LLE. exam stable  Assessment/Plan: 1. Functional deficits which require 3+ hours per day of interdisciplinary therapy in a comprehensive inpatient rehab setting. Physiatrist is providing close team supervision and 24 hour management of active medical problems listed below. Physiatrist  and rehab team continue to assess barriers to discharge/monitor patient progress toward functional and medical goals  Care Tool:  Bathing    Body parts bathed by patient: Right arm, Right lower leg, Left arm, Left lower leg, Chest, Abdomen, Face, Front perineal area, Buttocks, Right upper leg, Left upper leg         Bathing assist Assist Level: Supervision/Verbal cueing     Upper Body Dressing/Undressing Upper body dressing   What is the patient wearing?: Pull over shirt    Upper body assist Assist Level: Set up assist    Lower Body Dressing/Undressing Lower body dressing      What is the patient wearing?: Pants     Lower body assist Assist for lower body dressing: Supervision/Verbal cueing     Toileting Toileting    Toileting assist Assist for toileting: Supervision/Verbal cueing     Transfers Chair/bed transfer  Transfers assist  Chair/bed transfer activity did not occur: Safety/medical concerns  Chair/bed transfer assist level: Supervision/Verbal cueing Chair/bed transfer assistive device: Programmer, multimedia   Ambulation assist      Assist level: Supervision/Verbal cueing Assistive device: Walker-rolling Max distance: >350 ft   Walk 10 feet activity   Assist     Assist level: Supervision/Verbal cueing Assistive device:  Walker-rolling   Walk 50 feet activity   Assist    Assist level: Supervision/Verbal cueing Assistive device: Walker-rolling    Walk 150 feet activity   Assist    Assist level: Supervision/Verbal cueing Assistive device: Walker-rolling    Walk 10 feet on uneven surface  activity   Assist Walk 10 feet on uneven surfaces activity did not occur: Safety/medical concerns   Assist level: Supervision/Verbal cueing Assistive device: Walker-rolling   Wheelchair     Assist Is the patient using a wheelchair?: No   Wheelchair activity did not occur: N/A         Wheelchair 50 feet with 2 turns  activity    Assist    Wheelchair 50 feet with 2 turns activity did not occur: N/A       Wheelchair 150 feet activity     Assist  Wheelchair 150 feet activity did not occur: N/A       Blood pressure 126/73, pulse 85, temperature 98.1 F (36.7 C), temperature source Oral, resp. rate 16, height 6\' 4"  (1.93 m), weight 61.1 kg, SpO2 95 %.    Medical Problem List and Plan: 1. Functional deficits secondary to right SDH after fall             dc home today  -f/u with primary, [facility], urology, NS  -discussed safety awareness at home. 2.  Antithrombotics: -DVT/anticoagulation:  Mechanical: Sequential compression devices, below knee Bilateral lower extremities             -antiplatelet therapy: Off Plavix due to hematuria.  3. Pain Management: tylenol prn.   1/13- Denies pain- con't regimen 4. Mood: LCSW to follow for evaluation and support.              -antipsychotic agents: N/A  5. Neuropsych: This patient maybe intermittently capable of making decisions on his own behalf. 6. Skin/Wound Care: Routine pressure relief measures.  7. Fluids/Electrolytes/Nutrition:  BUN/Cr wnl recently  -received IVF this past weekend 8. Prostate cancer/Urinary retention: Monitor for hematuria.              -On Proscar with ditropan for bladder spasms.  1/13 ucx with 100k klebsiella. Sensitive to keflex started 1/8--7Days     Still not voiding-->replace foley-->f/u with urology 9. T2DM: Hgb A 1C-  Monitor BS ac/hs. Use SSI for elevated BS.              --increased metformin to 500mg  BID CBG (last 3)  Recent Labs    11/19/21 2121 11/19/21 2259 11/20/21 0602  GLUCAP 66* 86 90     1/13 hypoglycemic yesterday--would hold metformin for now and monitor cbg's at home.  10. Chronic combined systolic/diastolic CHF: Daily weights with low salt diet.  --Monitor for signs of overload.  Filed Weights   11/16/21 0500 11/19/21 1300 11/20/21 0500  Weight: 66.3 kg 66 kg 61.1 kg   -need a weight today  1/12!Marland Kitchen 11. Alzheimer's dementia: Followed by Dr. Delice Lesch. Continue Namenda and Aricept             --Wellbutrin for anger management.              --used ativan prn for anxiety issues.  12. Underweight/cachexia-  BMI 17.98: encourage healthy diet.  13. Constipation.   moved bowels 1/11--pt moved bowels prior to sorbitol     14. Insomnia  1/4-  scheduled Trazodone 50 mg QHS nightly- --improved 15. Hypotension/near syncope    1/12-need to continue pushing fluids -THT's and abd binder.  -  labs wnl   -hold magnesium.  1/13 bp more robust this morning   At least 35 total minutes were spent in examination of patient, assessment of pertinent data,  formulation of a treatment plan, and in discussion with patient and/or family.      LOS: 10 days A FACE TO FACE EVALUATION WAS PERFORMED  Meredith Staggers 11/20/2021, 11:35 AM

## 2021-11-20 NOTE — Progress Notes (Signed)
INPATIENT REHABILITATION DISCHARGE NOTE     Discharge instructions by:Dan  PA-C   Verbalized understanding: yes   Skin care/Wound care healing? None    Pain: no pain    IV's:removed   Tubes/Drains: foley catheter in place   O2: room air    Safety instructions: given to wife   Patient belongings: sent with wife    Discharged to: home    Discharged via: wheelchair

## 2021-11-20 NOTE — Progress Notes (Addendum)
Patient ID: Kevin [name], male   DOB: 02-01-1946, 76 y.o.   MRN: [id number]  SW met with [facility] who reported she came to visit after his wife submitted a referral for care. Reports he did not meet criteria for hospice.   SW sent HHPT/OT/SLP/RN (foley care) order to [facility] and pt [place].  *SW scheduled post hospital follow-up appointment for 1/17 at 10:30am with Dr. Jonnie Kind (Dr. Doyle Askew moved to a new clinic). Staff also reported that he has an appt at 2:30pm with nephrology and 3pm with urology.   SW met with pt and pt wife Kevin [name] in room to inform on above. She reported she spoke with medical staff and his nephrology and urology appt were cancelled. SW encouraged her to follow-up as these appointments are still showing in their system. SW provided new AVS with updated information.   Loralee Pacas, MSW, [place] Office: [phone] Cell: [phone] Fax: [phone]

## 2021-11-20 NOTE — Progress Notes (Signed)
Inpatient Rehabilitation Care Coordinator Discharge Note   Patient Details  Name: Kevin [name] MRN: [id number] Date of Birth: 1946/08/02   Discharge location: d/c to home with his wife.  Length of Stay: 9 days  Discharge activity level: Supervision using a RW; 24/7 supervision recommended  Home/community participation: Limited  Patient response ML:YYTKPT Literacy - How often do you need to have someone help you when you read instructions, pamphlets, or other written material from your doctor or pharmacy?: Sometimes  Patient response WS:FKCLEX Isolation - How often do you feel lonely or isolated from those around you?: Never  Services provided included: MD, RD, PT, OT, SLP, RN, CM, Pharmacy, Neuropsych, SW, TR  Financial Services:  Charity fundraiser Utilized: Risk manager  Choices offered to/list presented to: Yes  Follow-up services arranged:  [place]: [facility] for HHPT/OT/SLP/RN (foley care). Agency in place from previous admission.    DME : already provided    Patient response to transportation need: Is the patient able to respond to transportation needs?: Yes In the past 12 months, has lack of transportation kept you from medical appointments or from getting medications?: No In the past 12 months, has lack of transportation kept you from meetings, work, or from getting things needed for daily living?: No  Comments (or additional information):  Patient/Family verbalized understanding of follow-up arrangements:  Yes  Individual responsible for coordination of the follow-up plan: contact pt wife Kevin [name]#[phone]  Confirmed correct DME delivered: Kevin [name] 11/20/2021    Kevin [name]

## 2021-11-23 NOTE — Discharge Summary (Signed)
Physician Discharge Summary  Patient ID: Kevin [name] MRN: [id number] DOB/AGE: [birth year] 76 y.o.  Admit date: 11/10/2021 Discharge date: 11/20/2021  Discharge Diagnoses:  Principal Problem:   SDH (subdural hematoma) Active Problems:   Insomnia   DM2 (diabetes mellitus, type 2) (HCC)   Hypotension   UTI (urinary tract infection)   Urinary retention with incomplete bladder emptying   Discharged Condition: stable  Significant Diagnostic Studies: N/A   Labs:  Basic Metabolic Panel: BMP Latest Ref Rng & Units 11/16/2021 11/15/2021 11/14/2021  Glucose 70 - 99 mg/dL 88 84 128(H)  BUN 8 - 23 mg/dL 17 22 24(H)  Creatinine 0.61 - 1.24 mg/dL 1.15 1.25(H) 1.31(H)  BUN/Creat Ratio 10 - 24 - - -  Sodium 135 - 145 mmol/L 138 135 136  Potassium 3.5 - 5.1 mmol/L 4.1 4.3 4.5  Chloride 98 - 111 mmol/L 104 105 102  CO2 22 - 32 mmol/L 26 22 26   Calcium 8.9 - 10.3 mg/dL 8.8(L) 8.6(L) 9.0     CBC: CBC Latest Ref Rng & Units 11/16/2021 11/11/2021 11/09/2021  WBC 4.0 - 10.5 K/uL 6.5 8.7 8.0  Hemoglobin 13.0 - 17.0 g/dL 12.6(L) 13.6 13.5  Hematocrit 39.0 - 52.0 % 38.3(L) 41.8 41.2  Platelets 150 - 400 K/uL 135(L) 208 213     CBG: Recent Labs  Lab 11/19/21 1639 11/19/21 1707 11/19/21 2121 11/19/21 2259 11/20/21 0602  GLUCAP 68* 93 66* 86 90    Brief HPI:   Kevin [name] is a 76 y.o. male with history of an ICM, agent orange exposure, T2DM, LBBB s/p PPM, recent fall complicated by pelvic fracture and hematoma as well as urinary retention requiring Foley.  He underwent CIR was discharged to home 12/27 with Foley due to ongoing voiding issues.  He was readmitted on 12/29 after a fall backwards while coming out of the bathroom and noted to have gross hematuria in Foley bag.  Work-up done revealed small anterior falcine subdural hemorrhage which was stable and Plavix was discontinued.  CT stone was negative for cause of hematuria and showed ongoing bladder displacement to the left.  Foley was  changed out in the ED and patient was started on finasteride per discussion with Dr. Gloriann Loan.  UA showed hematuria with stable H&H and leukocytosis had resolved.  Patient reported history of dizziness with tinnitus and occasional double vision for years as well as anxiety with p.o. following.  Therapy was initiated and patient was noted to have balance deficits with poor posture as well as significant cognitive deficits.  CIR was recommended due to functional decline.   Hospital Course: Kevin [name] was admitted to rehab 11/10/2021 for inpatient therapies to consist of PT, ST and OT at least three hours five days a week. Past admission physiatrist, therapy team and rehab RN have worked together to provide customized collaborative inpatient rehab. His Blood pressures were monitored on TID basis and he was noted to have issues with dizziness due to orthostatic changes. He was treated with IVF X 24 hours and Abdominal binder as well as TEDs were added to help support BP.  Foley was discontinued on 01/05 and voiding trial initiated. PVR checks initially showed volumes less that 200 cc.  Ditropan was added to help with bladder spasms and urine culture done revealing Kleb UTI. He was started on Keflex for on week course of treatment. He has continued to require I/O cath due to retention and elected on foley to SD at discharge.   His diabetes  has been monitored with ac/hs CBG checks and SSI was use prn for tighter BS control. Metformin was titrated to 500 mg bid. Bowel program was augmented to help manage constipation. Follow up labs showed that AKI has resolved. He has also had issues with sleep wake disruption and trazodone 50 mg was added at bedtime with good results.  He has made gains during his stay and supervision is recommended for fall prevention and for safety.  He will continue to receive further follow-up HHPT, [place], HHST and [place] [facility] [place] after discharge.   Rehab course: During patient's  stay in rehab weekly team conferences were held to monitor patient's progress, set goals and discuss barriers to discharge. At admission, patient required min assist with basic ADL tasks and with mobility.  He exhibited mild to moderate cognitive deficits with SLUMS score 17/27. He has had improvement in activity tolerance, balance, postural control as well as ability to compensate for deficits.  He requires supervision with ADL tasks.  He requires supervision for transfers and is able to ambulate with use of rolling walker and verbal cues for safety.  He requires supervision to min assist with basic problem solving. Family education was completed with wife.   Discharge disposition: 01-Home or Self Care  Diet: Heart Healthy/Carb Modified.   Special Instructions: Continue to hold Plavix.  2. Foley care BID.   Discharge Instructions     Ambulatory referral to Physical Medicine Rehab   Complete by: As directed    Moderate complexity follow-up 1 to 2 weeks traumatic SDH   Ambulatory referral to Urology   Complete by: As directed       Allergies as of 11/20/2021       Reactions   Sulfa Antibiotics Swelling   Swelling of lips only.   5-alpha Reductase Inhibitors    Patient and wife state OK for patient to take finasteride - has not taken it before, NKA        Medication List     STOP taking these medications    clobetasol ointment 0.05 % Commonly known as: TEMOVATE   LORazepam 0.5 MG tablet Commonly known as: ATIVAN   Melatonin 10 MG Tabs   oxybutynin 5 MG tablet Commonly known as: DITROPAN   sodium chloride 0.65 % Soln nasal spray Commonly known as: OCEAN       TAKE these medications    acetaminophen 325 MG tablet Commonly known as: TYLENOL Take 1-2 tablets (325-650 mg total) by mouth every 4 (four) hours as needed for mild pain.   atorvastatin 80 MG tablet Commonly known as: LIPITOR Take 0.5 tablets (40 mg total) by mouth every evening.   buPROPion 150 MG 24  hr tablet Commonly known as: WELLBUTRIN XL Take 2 tablets in AM, 1 tablet in PM   cephALEXin 500 MG capsule Commonly known as: KEFLEX Take 1 capsule (500 mg total) by mouth every 8 (eight) hours.   cholecalciferol 25 MCG (1000 UNIT) tablet Commonly known as: VITAMIN D Take 1 tablet (1,000 Units total) by mouth daily.   donepezil 5 MG tablet Commonly known as: ARICEPT Take 1 tablet (5 mg total) by mouth daily.   EYE DROPS OP Place 1 drop into both eyes 3 (three) times daily. Given by [facility] doctor to help astigmtism   finasteride 5 MG tablet Commonly known as: PROSCAR Take 1 tablet (5 mg total) by mouth daily.   HYDROcodone-acetaminophen 5-325 MG tablet Commonly known as: NORCO/VICODIN Take 1 tablet by mouth every  4 (four) hours as needed for severe pain. What changed:  how much to take reasons to take this   loratadine 10 MG tablet Commonly known as: CLARITIN Take 1 tablet (10 mg total) by mouth daily.   magnesium gluconate 500 MG tablet Commonly known as: MAGONATE Take 0.5 tablets (250 mg total) by mouth daily.   memantine 10 MG tablet Commonly known as: NAMENDA Take 1 tablet twice a day What changed:  how much to take how to take this when to take this additional instructions   metFORMIN 500 MG tablet Commonly known as: GLUCOPHAGE Take 1 tablet (500 mg total) by mouth 2 (two) times daily with a meal. What changed: how much to take   polyethylene glycol 17 g packet Commonly known as: MIRALAX / GLYCOLAX Take 17 g by mouth daily as needed for mild constipation. What changed:  when to take this reasons to take this   traZODone 50 MG tablet Commonly known as: DESYREL Take 1 tablet (50 mg total) by mouth at bedtime.        Follow-up Information     Meredith Staggers, MD Follow up.   Specialty: Physical Medicine and Rehabilitation Why: Office to call for appointment Contact information: [address] [place] [address] [phone]          Lucas Mallow, MD Follow up.   Specialty: Urology Why: Call for appointment Contact information: [place] [address] [phone]                 Signed: Bary Leriche 11/26/2021, 1:02 AM

## 2021-11-25 ENCOUNTER — Telehealth: Payer: Self-pay | Admitting: Family Medicine

## 2021-11-25 NOTE — Telephone Encounter (Signed)
Stanton Kidney is a Marine scientist from [employer] and she called because she saw the patient for resumption of home healthcare following the patient's hospitalization. She says that they would like a plan of care for 1 time a week for four weeks. She also says that they would like a home health aide to visit the patient for a couple of weeks.  Stanton Kidney can be reached at [phone].  She also wanted to add that the patient does not have trazodone and metformin in the home at the moment, but the patient's wife is going out to pick that up today.  Please advise.

## 2021-11-26 DIAGNOSIS — N390 Urinary tract infection, site not specified: Secondary | ICD-10-CM

## 2021-11-26 DIAGNOSIS — I959 Hypotension, unspecified: Secondary | ICD-10-CM

## 2021-11-26 DIAGNOSIS — R339 Retention of urine, unspecified: Secondary | ICD-10-CM

## 2021-11-26 NOTE — Telephone Encounter (Signed)
Spoke with Stanton Kidney. Orders have been given.

## 2021-11-30 ENCOUNTER — Telehealth: Payer: Self-pay | Admitting: Family Medicine

## 2021-11-30 NOTE — Telephone Encounter (Signed)
Left message for patient's wife to call back.  

## 2021-11-30 NOTE — Telephone Encounter (Signed)
Patient's wife called to schedule an appointment for her husband. Offered her appointments for Wednesday, 1/25. Patient's wife told me that it would be too late then. Informed her that there were no available appointments for today and the only available appointment for tomorrow is 15 minutes, and Dr. Elease Hashimoto would need 30 minutes with the patient.  Patient's wife refused another offer for appointments for Wednesday, and told me that "the patient would be dead by then and that she hopes I am happy with that.".

## 2021-12-01 ENCOUNTER — Emergency Department (HOSPITAL_COMMUNITY)
Admission: EM | Admit: 2021-12-01 | Discharge: 2021-12-02 | Disposition: A | Discharge status: Home / Self Care | Payer: No Typology Code available for payment source | Attending: Emergency Medicine | Admitting: Emergency Medicine

## 2021-12-01 ENCOUNTER — Telehealth: Payer: Self-pay | Admitting: Family Medicine

## 2021-12-01 ENCOUNTER — Other Ambulatory Visit: Payer: Self-pay

## 2021-12-01 ENCOUNTER — Encounter (HOSPITAL_COMMUNITY): Payer: Self-pay | Admitting: Emergency Medicine

## 2021-12-01 DIAGNOSIS — F0390 Unspecified dementia without behavioral disturbance: Secondary | ICD-10-CM | POA: Diagnosis not present

## 2021-12-01 DIAGNOSIS — Z5321 Procedure and treatment not carried out due to patient leaving prior to being seen by health care provider: Secondary | ICD-10-CM | POA: Insufficient documentation

## 2021-12-01 DIAGNOSIS — R531 Weakness: Secondary | ICD-10-CM | POA: Diagnosis present

## 2021-12-01 DIAGNOSIS — N390 Urinary tract infection, site not specified: Secondary | ICD-10-CM | POA: Diagnosis not present

## 2021-12-01 DIAGNOSIS — Z743 Need for continuous supervision: Secondary | ICD-10-CM | POA: Diagnosis not present

## 2021-12-01 DIAGNOSIS — R6889 Other general symptoms and signs: Secondary | ICD-10-CM | POA: Diagnosis not present

## 2021-12-01 NOTE — ED Triage Notes (Addendum)
Pt presents to ED BIB [facility] form home. Per EMS pt has had poor po intake, declining, generalized weakness since being discharged on 1/13. New blood in foley bag. Hx dementia.  Cbg was 60 at home. Pt eating and EMS found cbg at 89. Had UTI when last in hospital. Taken antibiotics compliantly.EMS give IVF  18Glac 106/60 HR - 95

## 2021-12-01 NOTE — ED Notes (Signed)
Called x3 for MSE. No answer

## 2021-12-01 NOTE — Telephone Encounter (Signed)
Mary from [facility] called to let Dr. Elease Hashimoto know that the patient is heading in to [facility] for evaluation.  If needed, Stanton Kidney can be reached at [phone].  Please advise.

## 2021-12-01 NOTE — Telephone Encounter (Signed)
Left message for patient to call back  

## 2021-12-02 NOTE — Telephone Encounter (Signed)
Left message for patient to call back. Unable to reach the patient or his wife. Message will be closed.

## 2021-12-03 DIAGNOSIS — R338 Other retention of urine: Secondary | ICD-10-CM | POA: Diagnosis not present

## 2021-12-07 ENCOUNTER — Encounter: Payer: No Typology Code available for payment source | Attending: Registered Nurse | Admitting: Registered Nurse

## 2021-12-10 ENCOUNTER — Telehealth: Payer: Self-pay

## 2021-12-10 NOTE — Telephone Encounter (Signed)
I called patient about missed ICM transmission but no answer/mailbox is full.

## 2021-12-11 NOTE — Progress Notes (Signed)
No ICM remote transmission received for 12/08/2021 and next ICM transmission scheduled for 01/04/2022.

## 2021-12-16 ENCOUNTER — Telehealth: Payer: Self-pay | Admitting: Family Medicine

## 2021-12-16 NOTE — Telephone Encounter (Signed)
Please advise if okay for verbal 

## 2021-12-16 NOTE — Telephone Encounter (Signed)
Needs nursing orders for 1 time a week for 2 more weeks.  Also needs a Education officer, museum to come out for evaluation.

## 2021-12-16 NOTE — Telephone Encounter (Signed)
Called Kevin [name] verbal okay given.

## 2021-12-25 ENCOUNTER — Ambulatory Visit: Payer: Medicare HMO | Admitting: Family Medicine

## 2021-12-28 ENCOUNTER — Ambulatory Visit: Payer: Medicare HMO | Admitting: Family Medicine

## 2021-12-29 ENCOUNTER — Telehealth: Payer: Self-pay

## 2021-12-29 NOTE — Telephone Encounter (Signed)
Attempted to contact patient and patient's wife [name] to schedule a Palliative Care consult appointment. No answer left a message to return call.

## 2021-12-29 NOTE — Telephone Encounter (Signed)
Spoke with patient's wife [name] and scheduled a Mychart Palliative Consult for 01/04/22 @ 2:30 PM.   Consent obtained; updated Outlook/Netsmart/Team List and Epic.

## 2021-12-30 ENCOUNTER — Encounter: Payer: Self-pay | Admitting: Family Medicine

## 2021-12-30 ENCOUNTER — Ambulatory Visit: Payer: Medicare HMO | Admitting: Family Medicine

## 2021-12-31 ENCOUNTER — Telehealth: Payer: Self-pay | Admitting: Family Medicine

## 2021-12-31 NOTE — Telephone Encounter (Signed)
Kevin [name] with [facility] called in stat blood pressure was pretty low today (98/62). Patient is still not eating very well and not drinking any fluid. Patient hasn't received appetite stimulant.   Stanton Kidney stated that she resert orders to see him once a week for 6 weeks.  Stanton Kidney could be contacted at [phone].  Please advise.

## 2021-12-31 NOTE — Telephone Encounter (Signed)
Please advise 

## 2022-01-01 NOTE — Telephone Encounter (Signed)
Lvm for wife to call office and schedule virtual visit appointment for patient.

## 2022-01-04 ENCOUNTER — Telehealth: Payer: Medicare HMO | Admitting: Family Medicine

## 2022-01-04 ENCOUNTER — Telehealth (INDEPENDENT_AMBULATORY_CARE_PROVIDER_SITE_OTHER): Payer: Medicare HMO | Admitting: Family Medicine

## 2022-01-04 ENCOUNTER — Other Ambulatory Visit: Payer: Self-pay

## 2022-01-04 VITALS — Wt 114.0 lb

## 2022-01-04 DIAGNOSIS — R634 Abnormal weight loss: Secondary | ICD-10-CM | POA: Diagnosis not present

## 2022-01-04 DIAGNOSIS — R627 Adult failure to thrive: Secondary | ICD-10-CM | POA: Diagnosis not present

## 2022-01-04 DIAGNOSIS — Z515 Encounter for palliative care: Secondary | ICD-10-CM

## 2022-01-04 DIAGNOSIS — E440 Moderate protein-calorie malnutrition: Secondary | ICD-10-CM

## 2022-01-04 MED ORDER — MIRTAZAPINE 15 MG PO TBDP: 15.0000 mg | ORAL_TABLET | Freq: Every day | ORAL | 3 refills | Status: DC

## 2022-01-04 NOTE — Progress Notes (Signed)
Patient ID: Kevin [name], male   DOB: [birth year], 76 y.o.   MRN: [id number]  This visit type was conducted due to national recommendations for restrictions regarding the COVID-19 pandemic in an effort to limit this patient's exposure and mitigate transmission in our community.   Virtual Visit via Video Note  I connected with patient and his wife, Kevin [name].  On 01/04/22 at  1:15 PM EST by a video enabled telemedicine application and verified that I am speaking with the correct person using two identifiers.  Location patient: home Location provider:work or home office Persons participating in the virtual visit: patient, provider  I discussed the limitations of evaluation and management by telemedicine and the availability of in person appointments. The patient expressed understanding and agreed to proceed.   HPI:  Patient is very debilitated and we communicated predominantly with wife who is his primary caregiver.  He was recently admitted from 3 January through the 13th with subdural hematoma following a fall.  He also had pelvic fracture and urinary retention requiring Foley.  He has had significant failure to thrive and wife states he has lost about 30 pounds since he was in the hospital.  He had lost substantial weight even prior to his hospitalization.  She states his weight is down to 114 pounds couple days ago.  Very poor appetite.  She has tried multiple things including Glucerna, ice cream, fruits and he is declining most of that.  He is is taking a few bites here and there.  He is keeping down fluids.  No swallowing difficulties.  Wife has monitor his blood sugars and these have been very well controlled.  He is on Wellbutrin per neurology at fairly high dosage of 450 mg daily.  Also takes metformin for his diabetes.  We expressed our concerns regarding dehydration and worsening kidney function on the metformin.  He does have palliative care involved at this time.  Has not really ambulating  any significantly.   ROS: See pertinent positives and negatives per HPI.  Past Medical History:  Diagnosis Date   Agent orange exposure    in [place]   Anemia    Dementia ([place])    Heart block AV second degree    a. s/p PPM implant with subsequent CRTD upgrade   High cholesterol    History of colon polyps    LBBB (left bundle branch block)    Nonischemic cardiomyopathy ([place])    a. MDT CRTD upgrade 2016   Pacemaker 08/27/2013   Dual-chamber Medtronic Adapta implanted January 2013 for bradycardia with alternating bundle branch block and 2:1 atrioventricular block    Prostate cancer ([place])    "not been treated yet; on a wait and see" (01/22/2015)   Type II diabetes mellitus ([place])     Past Surgical History:  Procedure Laterality Date   BASAL CELL CARCINOMA EXCISION     "back, face, neck"   BI-VENTRICULAR IMPLANTABLE CARDIOVERTER DEFIBRILLATOR UPGRADE N/A 01/22/2015   MDT CRTD upgrade by Dr Rayann Heman   BIV ICD GENERATOR CHANGEOUT N/A 08/28/2020   Procedure: BIV ICD GENERATOR CHANGEOUT;  Surgeon: [name], MD;  Location: [facility];  Service: Cardiovascular;  Laterality: N/A;   CARDIAC CATHETERIZATION  01/27/2012   normal coronaries, dilated LV c/w nonischemic cardiomyopathy   EXAMINATION UNDER ANESTHESIA  04/06/2012   Procedure: EXAM UNDER ANESTHESIA;  Surgeon: Marcello Moores A. Cornett, MD;  Location: [facility];  Service: General;  Laterality: N/A;   INCISION AND DRAINAGE PERIRECTAL ABSCESS  ~  2010; 2015   INGUINAL HERNIA REPAIR Left 1980   IR ANGIOGRAM PELVIS SELECTIVE OR SUPRASELECTIVE  10/20/2021   IR ANGIOGRAM VISCERAL SELECTIVE  10/20/2021   IR EMBO ART  VEN HEMORR LYMPH EXTRAV  INC GUIDE ROADMAPPING  10/20/2021   IR US GUIDE VASC ACCESS LEFT  10/20/2021   LEFT HEART CATHETERIZATION WITH CORONARY ANGIOGRAM N/A 01/27/2012   Procedure: LEFT HEART CATHETERIZATION WITH CORONARY ANGIOGRAM;  Surgeon: Troy Sine, MD;  Location: [facility] CATH LAB;  Service: Cardiovascular;  Laterality:  N/A;   NM MYOCAR PERF WALL MOTION  01/21/2012   mod-severe perfusion defect due to infarct/scar w/mild perinfarct ischemia in the apical,basal inferoseptal,basal inferior,mid inferoseptal,midinferior and apical inferior regions   pacemaker battery     PERMANENT PACEMAKER INSERTION N/A 12/01/2011   Medtronic implanted by Dr Sallyanne Kuster   PROSTATE BIOPSY     PROSTATE BIOPSY     shrapnel surgery     "in [place]"   TONSILLECTOMY      Family History  Problem Relation Age of Onset   Ovarian cancer Mother    Cancer Mother        ?uterine   Leukemia Father    Heart disease Father    Diabetes Neg Hx     SOCIAL HX: Lives with wife.  History of agent orange exposure during [place].   Current Outpatient Medications:    acetaminophen (TYLENOL) 325 MG tablet, Take 1-2 tablets (325-650 mg total) by mouth every 4 (four) hours as needed for mild pain., Disp: , Rfl:    atorvastatin (LIPITOR) 80 MG tablet, Take 0.5 tablets (40 mg total) by mouth every evening., Disp: 30 tablet, Rfl: 0   buPROPion (WELLBUTRIN XL) 150 MG 24 hr tablet, Take 2 tablets in AM, 1 tablet in PM, Disp: 270 tablet, Rfl: 3   Carboxymethylcellulose Sodium (EYE DROPS OP), Place 1 drop into both eyes 3 (three) times daily. Given by [facility] doctor to help astigmtism, Disp: , Rfl:    cholecalciferol (VITAMIN D) 25 MCG (1000 UNIT) tablet, Take 1 tablet (1,000 Units total) by mouth daily., Disp: 30 tablet, Rfl: 0   donepezil (ARICEPT) 5 MG tablet, Take 1 tablet (5 mg total) by mouth daily., Disp: 90 tablet, Rfl: 3   loratadine (CLARITIN) 10 MG tablet, Take 1 tablet (10 mg total) by mouth daily., Disp: 30 tablet, Rfl: 0   magnesium gluconate (MAGONATE) 500 MG tablet, Take 0.5 tablets (250 mg total) by mouth daily., Disp: 30 tablet, Rfl: 0   memantine (NAMENDA) 10 MG tablet, Take 1 tablet twice a day (Patient taking differently: Take 10 mg by mouth 2 (two) times daily.), Disp: 180 tablet, Rfl: 3   metFORMIN (GLUCOPHAGE) 500 MG tablet, Take 1  tablet (500 mg total) by mouth 2 (two) times daily with a meal., Disp: 60 tablet, Rfl: 0   mirtazapine (REMERON SOL-TAB) 15 MG disintegrating tablet, Take 1 tablet (15 mg total) by mouth at bedtime., Disp: 30 tablet, Rfl: 3   Continuous Blood Gluc Sensor (FREESTYLE LIBRE 14 DAY SENSOR) MISC, SMARTSIG:1 Each Topical Every 2 Weeks, Disp: , Rfl:    finasteride (PROSCAR) 5 MG tablet, Take 1 tablet (5 mg total) by mouth daily. (Patient not taking: Reported on 01/04/2022), Disp: 30 tablet, Rfl: 0   HYDROcodone-acetaminophen (NORCO/VICODIN) 5-325 MG tablet, Take 1 tablet by mouth every 4 (four) hours as needed for severe pain. (Patient not taking: Reported on 01/04/2022), Disp: 30 tablet, Rfl: 0   polyethylene glycol (MIRALAX / GLYCOLAX) 17 g packet, Take 17  g by mouth daily as needed for mild constipation. (Patient not taking: Reported on 01/04/2022), Disp: 14 each, Rfl: 0  EXAM:  VITALS per patient if applicable:  GENERAL: alert, oriented, appears well and in no acute distress  HEENT: atraumatic, conjunttiva clear, no obvious abnormalities on inspection of external nose and ears  NECK: normal movements of the head and neck  LUNGS: on inspection no signs of respiratory distress, breathing rate appears normal, no obvious gross SOB, gasping or wheezing  CV: no obvious cyanosis  MS: moves all visible extremities without noticeable abnormality  PSYCH/NEURO: pleasant and cooperative, no obvious depression or anxiety, speech and thought processing grossly intact  ASSESSMENT AND PLAN:  Discussed the following assessment and plan:  Adult failure to thrive with significant cachexia and progressive weight loss in the setting of recent subdural hematoma and multiple other medical problems including dementia..  Wife aware that his prognosis is grim without further changes soon in terms of improved nutritional intake.  We did discuss appetite stimulation medications as that was her main question.  We  discussed risk of things like Megace with thrombosis and also potentially worsening his glucose control although the latter would be a lesser concern.  We decided to consider trial of Remeron 15 mg nightly which would certainly be a safer option but may not be effective.  She will try to continue with nutrition supplements.  We recommended some feedback within the next couple of weeks.     I discussed the assessment and treatment plan with the patient. The patient was provided an opportunity to ask questions and all were answered. The patient agreed with the plan and demonstrated an understanding of the instructions.   The patient was advised to call back or seek an in-person evaluation if the symptoms worsen or if the condition fails to improve as anticipated.     Carolann Littler, MD

## 2022-01-04 NOTE — Progress Notes (Signed)
° ° °[facility] °Community Palliative Care Consult Note °Telephone: [phone]  °Fax: [phone]  ° °Date of encounter: 01/04/22 °2:53 PM °PATIENT NAME: Kevin [name] °[address] °[place] [address]   °[phone] (home)  °DOB: 02/28/1946 °MRN: [id number] °PRIMARY CARE PROVIDER:    °Burchette, Bruce W, MD,  °[address] °[place] [address] °[phone] ° °REFERRING PROVIDER:   °Burchette, Bruce W, MD °[address] °,  [place] [address] [phone] ° °RESPONSIBLE PARTY:    °Contact Information   ° ° Name Relation Home Work Mobile  ° Wisniewski,[name] Spouse   [phone]  ° Cox,Cheryl Relative   [phone]  ° °  °I connected with  Kevin [name] on 01/10/22 by a video enabled telemedicine application and verified that I am speaking with the correct person using two identifiers. Repetitively was disconnected Spoke primarily with wife. Pt is a [place] Veteran and receives care at the [facility]. °  °I discussed the limitations of evaluation and management by telemedicine. The patient expressed understanding and agreed to proceed.  ° ° °Palliative Care was asked to follow this patient by consultation request of  Burchette, Bruce W, MD to address advance care planning and complex medical decision making. This is the initial visit.  ° ° °      ASSESSMENT, SYMPTOM MANAGEMENT AND PLAN / RECOMMENDATIONS:  ° Palliative Care Encounter °Wife is health care agent. °Need to address code status and goals of care at in-person visit. ° °2.  Moderate protein calorie malnutrition °Diabetic with poor intake and rapid weight loss. °Attempt getting protein sources in patient, including using protein shakes and ice cream or ensure pudding to get in medications and small frequent meals.. ° ° °Follow up Palliative Care Visit: Palliative care will continue to follow for complex medical decision making, advance care planning, and clarification of goals. Return 2 weeks  or prn. ° ° ° °This visit was coded based on medical decision making (MDM). ° °PPS: 50% ° °HOSPICE ELIGIBILITY/DIAGNOSIS: TBD ° °Chief Complaint:  °[facility] received a referral to follow up with patient for chronic disease management, advance directive and defining/refining goals of care in patient with significant failure to thrive. ° ° °HISTORY OF PRESENT ILLNESS:  Kevin [name] is a 76 y.o. 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 Pt states appetite is poor for at least the last 2 weeks and he has lost 30 lbs since his hospitalization. He does not like protein supplements and his blood sugars have been good. He requires assistance for bathing and dressing, has not been moving around his home very much.  Encouraged wife to get in whatever he would eat but try primarily protein sources rather than carbohydrates to help him maintain. Last albumin 2 months ago 3.1. Has had some issues with acute renal insufficiency, Cr 1.2-1.3 with  eGFR 57-60.  Most recent echo 08/27/20 with EF 40-45%, left ventricular global hypokinesis. ° °History obtained from review of EMR, interview with family and/or Kevin [name].  °I reviewed available labs, medications, imaging, studies and related documents from the EMR.  Records reviewed and summarized above.  ° °ROS °General: NAD °EYES: denies vision changes °ENMT: denies dysphagia °Cardiovascular: denies chest pain, denies DOE °Pulmonary: denies cough, denies increased SOB °Abdomen:   Abdomen: endorses poor appetite, denies constipation, endorses continence of bowel GU: denies dysuria, endorses continence of urine MSK:  denies increased weakness, falls December 13 and 29, 2022 Skin: denies rashes or  wounds Neurological: denies pain, denies insomnia Psych: Endorses blunted mood Heme/lymph/immuno: denies bruises, abnormal bleeding  Physical Exam: limited observation Current and past weights: Last noted weight 11/10/21 was 134 lbs 11.2 ounces, weight 09/10/21 was 160 lbs 12.8 ounces Constitutional: NAD General: frail appearing, thin CV: no visible cyanosis Pulmonary: no audible SOB or wheezing   CURRENT PROBLEM LIST:  Patient Active Problem List   Diagnosis Date Noted   Hypotension 11/26/2021   UTI (urinary tract infection) 11/26/2021   Urinary retention with incomplete bladder emptying 11/26/2021   SDH (subdural hematoma) 11/10/2021   Subdural hematoma 11/05/2021   Carcinoma of prostate (Kevin [name]) 11/05/2021   Chronic kidney disease, stage 3 unspecified (Kevin [name]) 11/05/2021   Malnutrition of moderate degree 10/28/2021   Fracture of multiple pubic rami with routine healing, right 10/24/2021   Fall 10/21/2021   Pelvic hematoma in male 10/21/2021   Pubic ramus fracture (Kevin [name]) 10/21/2021   Acute blood loss anemia 10/21/2021   Acute COVID-19 06/16/2021   Chronic combined systolic and diastolic heart failure (Kevin [name]) 09/28/2017   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 in situ 09/28/2017   Hx of adenomatous colonic polyps 10/20/2016   Malignant neoplasm of prostate (Kevin [name]) 14/78/2956   Chronic systolic dysfunction of left ventricle 01/22/2015   Left bundle branch block 11/24/2014   Sick sinus syndrome (Kevin [name]) 11/24/2014   Nonischemic cardiomyopathy (Kevin [name]) 11/23/2013   Ventricular tachycardia 11/23/2013   DM2 (diabetes mellitus, type 2) (Kevin [name]) 08/27/2013   Hyperlipidemia 08/27/2013   Condyloma 05/14/2013   Penile abnormality 05/05/2013   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/01/2013   Penile mass 02/19/2013   Hypertension 10/16/2012   Rectal discharge 07/14/2012   Jaw pain 07/14/2012   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   Insomnia 04/16/2011   Anxiety 04/16/2011   PAST MEDICAL HISTORY:   Active Ambulatory Problems    Diagnosis Date Noted   Insomnia 04/16/2011   Anxiety 04/16/2011   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   Rectal discharge 07/14/2012   Jaw pain 07/14/2012   Hypertension 10/16/2012   DM2 (diabetes mellitus, type 2) ([place]) 08/27/2013   Hyperlipidemia 08/27/2013   Nonischemic cardiomyopathy ([place]) 11/23/2013   Ventricular tachycardia 11/23/2013   Left bundle branch block 11/24/2014   Sick sinus syndrome ([place]) 21/30/8657   Chronic systolic dysfunction of left ventricle 01/22/2015   Malignant neoplasm of prostate ([place]) 01/19/2016   Hx of adenomatous colonic polyps 10/20/2016   Chronic combined systolic and diastolic heart failure ([place]) 09/28/2017   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 in situ 09/28/2017   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/01/2013   Condyloma 05/14/2013   Penile abnormality 05/05/2013   Penile mass 02/19/2013   Acute COVID-19 06/16/2021   Fall 10/21/2021   Pelvic hematoma in male 10/21/2021   Pubic ramus fracture ([place]) 10/21/2021   Acute blood loss anemia 10/21/2021   Fracture of multiple pubic rami with routine healing, right 10/24/2021   Malnutrition of moderate degree 10/28/2021   Subdural hematoma 11/05/2021   Carcinoma of prostate ([place]) 11/05/2021   Chronic kidney disease, stage 3 unspecified ([place]) 11/05/2021   SDH (subdural hematoma) 11/10/2021   Hypotension 11/26/2021   UTI (urinary tract infection) 11/26/2021   Urinary retention with incomplete bladder emptying 11/26/2021   Resolved Ambulatory Problems    Diagnosis Date Noted   Type 2 diabetes mellitus ([place]) 04/16/2011   Post-operative  state 04/21/2012   History of rectal abscess 12/04/2012   Pacemaker 08/27/2013   Type 2 diabetes mellitus, uncontrolled ([place]) 09/13/2014   Shortness of breath 11/24/2014   Past Medical History:  Diagnosis Date   Agent orange exposure    Anemia    Dementia (HCC)    Heart block AV second degree    High cholesterol    History  of colon polyps    LBBB (left bundle branch block)    Prostate cancer (HCC)    Type II diabetes mellitus (HCC)    SOCIAL HX:  Social History   Tobacco Use   Smoking status: Former    Packs/day: 1.00    Years: 20.00    Pack years: 20.00    Types: Cigarettes    Quit date: 04/13/1984    Years since quitting: 37.7   Smokeless tobacco: Never  Substance Use Topics   Alcohol use: Yes    Comment: 01/22/2015 "maybe 3 beers or glasses of wine /month"   FAMILY HX:  Family History  Problem Relation Age of Onset   Ovarian cancer Mother    Cancer Mother        ?uterine   Leukemia Father    Heart disease Father    Diabetes Neg Hx        Preferred Pharmacy: [facility]:  Allergies  Allergen Reactions   Sulfa Antibiotics Swelling    Swelling of lips only.   5-Alpha Reductase Inhibitors     Patient and wife state OK for patient to take finasteride - has not taken it before, NKA     PERTINENT MEDICATIONS:  Outpatient Encounter Medications as of 01/04/2022  Medication Sig   acetaminophen (TYLENOL) 325 MG tablet Take 1-2 tablets (325-650 mg total) by mouth every 4 (four) hours as needed for mild pain.   atorvastatin (LIPITOR) 80 MG tablet Take 0.5 tablets (40 mg total) by mouth every evening.   buPROPion (WELLBUTRIN XL) 150 MG 24 hr tablet Take 2 tablets in AM, 1 tablet in PM   Carboxymethylcellulose Sodium (EYE DROPS OP) Place 1 drop into both eyes 3 (three) times daily. Given by [facility] doctor to help astigmtism   cholecalciferol (VITAMIN D) 25 MCG (1000 UNIT) tablet Take 1 tablet (1,000 Units total) by mouth daily.   Continuous Blood Gluc Sensor (FREESTYLE LIBRE 14 DAY SENSOR) MISC SMARTSIG:1 Each Topical Every 2 Weeks   donepezil (ARICEPT) 5 MG tablet Take 1 tablet (5 mg total) by mouth daily.   finasteride (PROSCAR) 5 MG tablet Take 1 tablet (5 mg total) by mouth daily. (Patient not taking: Reported on 01/04/2022)   HYDROcodone-acetaminophen (NORCO/VICODIN) 5-325 MG tablet Take 1 tablet by  mouth every 4 (four) hours as needed for severe pain. (Patient not taking: Reported on 01/04/2022)   loratadine (CLARITIN) 10 MG tablet Take 1 tablet (10 mg total) by mouth daily.   magnesium gluconate (MAGONATE) 500 MG tablet Take 0.5 tablets (250 mg total) by mouth daily.   memantine (NAMENDA) 10 MG tablet Take 1 tablet twice a day (Patient taking differently: Take 10 mg by mouth 2 (two) times daily.)   metFORMIN (GLUCOPHAGE) 500 MG tablet Take 1 tablet (500 mg total) by mouth 2 (two) times daily with a meal.   mirtazapine (REMERON SOL-TAB) 15 MG disintegrating tablet Take 1 tablet (15 mg total) by mouth at bedtime.   polyethylene glycol (MIRALAX / GLYCOLAX) 17 g packet Take 17 g by mouth daily as needed for mild constipation. (Patient not taking: Reported on 01/04/2022)  No facility-administered encounter medications on file as of 01/04/2022.     ---------------------------------------------------------------------------------------------------------------------------------------------------------------------------------------------------------------- Advance Care Planning/Goals of Care: Goals include to maximize quality of life and symptom management.  CODE STATUS: Full code at present   Thank you for the opportunity to participate in the care of Mr. Suazo.  The palliative care team will continue to follow. Please call our office at [phone] if we can be of additional assistance.   Marijo Conception, FNP-C  COVID-19 PATIENT SCREENING TOOL Asked and negative response unless otherwise noted:  Have you had symptoms of covid, tested positive or been in contact with someone with symptoms/positive test in the past 5-10 days? unknown

## 2022-01-06 ENCOUNTER — Telehealth: Payer: Self-pay

## 2022-01-06 NOTE — Telephone Encounter (Signed)
[employer] faxed a form stating the patient has stopped taking the Metformin. He was a no show for his Transitional Care appointment, but has seen his PCP. ?

## 2022-01-07 ENCOUNTER — Encounter (HOSPITAL_COMMUNITY): Payer: Self-pay

## 2022-01-07 ENCOUNTER — Emergency Department (HOSPITAL_COMMUNITY): Payer: No Typology Code available for payment source

## 2022-01-07 ENCOUNTER — Telehealth: Payer: Self-pay | Admitting: Family Medicine

## 2022-01-07 ENCOUNTER — Other Ambulatory Visit: Payer: Self-pay

## 2022-01-07 ENCOUNTER — Inpatient Hospital Stay (HOSPITAL_COMMUNITY)
Admission: EM | Admit: 2022-01-07 | Discharge: 2022-01-12 | DRG: 682 | Disposition: A | Discharge status: Home Health | Payer: No Typology Code available for payment source | Attending: Internal Medicine | Admitting: Internal Medicine

## 2022-01-07 DIAGNOSIS — R636 Underweight: Secondary | ICD-10-CM | POA: Diagnosis present

## 2022-01-07 DIAGNOSIS — E876 Hypokalemia: Secondary | ICD-10-CM | POA: Diagnosis present

## 2022-01-07 DIAGNOSIS — R64 Cachexia: Secondary | ICD-10-CM | POA: Diagnosis present

## 2022-01-07 DIAGNOSIS — E1122 Type 2 diabetes mellitus with diabetic chronic kidney disease: Secondary | ICD-10-CM | POA: Diagnosis present

## 2022-01-07 DIAGNOSIS — Z20822 Contact with and (suspected) exposure to covid-19: Secondary | ICD-10-CM | POA: Diagnosis present

## 2022-01-07 DIAGNOSIS — E8729 Other acidosis: Secondary | ICD-10-CM | POA: Insufficient documentation

## 2022-01-07 DIAGNOSIS — Z79899 Other long term (current) drug therapy: Secondary | ICD-10-CM

## 2022-01-07 DIAGNOSIS — Z7984 Long term (current) use of oral hypoglycemic drugs: Secondary | ICD-10-CM

## 2022-01-07 DIAGNOSIS — Z66 Do not resuscitate: Secondary | ICD-10-CM | POA: Diagnosis not present

## 2022-01-07 DIAGNOSIS — Z743 Need for continuous supervision: Secondary | ICD-10-CM | POA: Diagnosis not present

## 2022-01-07 DIAGNOSIS — R627 Adult failure to thrive: Secondary | ICD-10-CM | POA: Diagnosis present

## 2022-01-07 DIAGNOSIS — I5042 Chronic combined systolic (congestive) and diastolic (congestive) heart failure: Secondary | ICD-10-CM | POA: Diagnosis present

## 2022-01-07 DIAGNOSIS — F0390 Unspecified dementia without behavioral disturbance: Secondary | ICD-10-CM | POA: Diagnosis present

## 2022-01-07 DIAGNOSIS — Z8546 Personal history of malignant neoplasm of prostate: Secondary | ICD-10-CM

## 2022-01-07 DIAGNOSIS — R634 Abnormal weight loss: Secondary | ICD-10-CM | POA: Diagnosis not present

## 2022-01-07 DIAGNOSIS — E860 Dehydration: Secondary | ICD-10-CM | POA: Diagnosis present

## 2022-01-07 DIAGNOSIS — E872 Acidosis, unspecified: Secondary | ICD-10-CM | POA: Diagnosis not present

## 2022-01-07 DIAGNOSIS — R296 Repeated falls: Secondary | ICD-10-CM | POA: Diagnosis present

## 2022-01-07 DIAGNOSIS — J449 Chronic obstructive pulmonary disease, unspecified: Secondary | ICD-10-CM | POA: Diagnosis not present

## 2022-01-07 DIAGNOSIS — E7800 Pure hypercholesterolemia, unspecified: Secondary | ICD-10-CM | POA: Diagnosis present

## 2022-01-07 DIAGNOSIS — D696 Thrombocytopenia, unspecified: Secondary | ICD-10-CM | POA: Diagnosis present

## 2022-01-07 DIAGNOSIS — I428 Other cardiomyopathies: Secondary | ICD-10-CM | POA: Diagnosis present

## 2022-01-07 DIAGNOSIS — N1831 Chronic kidney disease, stage 3a: Secondary | ICD-10-CM | POA: Diagnosis present

## 2022-01-07 DIAGNOSIS — N179 Acute kidney failure, unspecified: Secondary | ICD-10-CM | POA: Diagnosis not present

## 2022-01-07 DIAGNOSIS — F419 Anxiety disorder, unspecified: Secondary | ICD-10-CM | POA: Diagnosis present

## 2022-01-07 DIAGNOSIS — E119 Type 2 diabetes mellitus without complications: Secondary | ICD-10-CM

## 2022-01-07 DIAGNOSIS — Z882 Allergy status to sulfonamides status: Secondary | ICD-10-CM

## 2022-01-07 DIAGNOSIS — R531 Weakness: Secondary | ICD-10-CM | POA: Diagnosis not present

## 2022-01-07 DIAGNOSIS — Z95810 Presence of automatic (implantable) cardiac defibrillator: Secondary | ICD-10-CM

## 2022-01-07 DIAGNOSIS — Z85828 Personal history of other malignant neoplasm of skin: Secondary | ICD-10-CM

## 2022-01-07 DIAGNOSIS — N390 Urinary tract infection, site not specified: Secondary | ICD-10-CM | POA: Diagnosis present

## 2022-01-07 DIAGNOSIS — G9341 Metabolic encephalopathy: Secondary | ICD-10-CM | POA: Diagnosis present

## 2022-01-07 DIAGNOSIS — Z87891 Personal history of nicotine dependence: Secondary | ICD-10-CM

## 2022-01-07 DIAGNOSIS — I441 Atrioventricular block, second degree: Secondary | ICD-10-CM | POA: Diagnosis present

## 2022-01-07 DIAGNOSIS — E1169 Type 2 diabetes mellitus with other specified complication: Secondary | ICD-10-CM | POA: Diagnosis present

## 2022-01-07 DIAGNOSIS — F05 Delirium due to known physiological condition: Secondary | ICD-10-CM | POA: Diagnosis not present

## 2022-01-07 DIAGNOSIS — R42 Dizziness and giddiness: Secondary | ICD-10-CM | POA: Diagnosis not present

## 2022-01-07 DIAGNOSIS — Z923 Personal history of irradiation: Secondary | ICD-10-CM

## 2022-01-07 DIAGNOSIS — F32A Depression, unspecified: Secondary | ICD-10-CM | POA: Diagnosis present

## 2022-01-07 DIAGNOSIS — I10 Essential (primary) hypertension: Secondary | ICD-10-CM | POA: Diagnosis present

## 2022-01-07 DIAGNOSIS — Z888 Allergy status to other drugs, medicaments and biological substances status: Secondary | ICD-10-CM

## 2022-01-07 DIAGNOSIS — F0393 Unspecified dementia, unspecified severity, with mood disturbance: Secondary | ICD-10-CM | POA: Diagnosis present

## 2022-01-07 DIAGNOSIS — Z681 Body mass index (BMI) 19 or less, adult: Secondary | ICD-10-CM

## 2022-01-07 DIAGNOSIS — I130 Hypertensive heart and chronic kidney disease with heart failure and stage 1 through stage 4 chronic kidney disease, or unspecified chronic kidney disease: Secondary | ICD-10-CM | POA: Diagnosis present

## 2022-01-07 DIAGNOSIS — I447 Left bundle-branch block, unspecified: Secondary | ICD-10-CM | POA: Diagnosis present

## 2022-01-07 DIAGNOSIS — R0902 Hypoxemia: Secondary | ICD-10-CM | POA: Diagnosis not present

## 2022-01-07 DIAGNOSIS — I959 Hypotension, unspecified: Secondary | ICD-10-CM | POA: Diagnosis present

## 2022-01-07 DIAGNOSIS — E785 Hyperlipidemia, unspecified: Secondary | ICD-10-CM | POA: Diagnosis present

## 2022-01-07 LAB — CBC WITH DIFFERENTIAL/PLATELET
Abs Immature Granulocytes: 0.03 10*3/uL (ref 0.00–0.07)
Basophils Absolute: 0 10*3/uL (ref 0.0–0.1)
Basophils Relative: 0 %
Eosinophils Absolute: 0 10*3/uL (ref 0.0–0.5)
Eosinophils Relative: 0 %
HCT: 51.3 % (ref 39.0–52.0)
Hemoglobin: 16.6 g/dL (ref 13.0–17.0)
Immature Granulocytes: 0 %
Lymphocytes Relative: 9 %
Lymphs Abs: 0.7 10*3/uL (ref 0.7–4.0)
MCH: 32.9 pg (ref 26.0–34.0)
MCHC: 32.4 g/dL (ref 30.0–36.0)
MCV: 101.8 fL — ABNORMAL HIGH (ref 80.0–100.0)
Monocytes Absolute: 0.4 10*3/uL (ref 0.1–1.0)
Monocytes Relative: 5 %
Neutro Abs: 7.3 10*3/uL (ref 1.7–7.7)
Neutrophils Relative %: 86 %
Platelets: 125 10*3/uL — ABNORMAL LOW (ref 150–400)
RBC: 5.04 MIL/uL (ref 4.22–5.81)
RDW: 13.2 % (ref 11.5–15.5)
WBC: 8.5 10*3/uL (ref 4.0–10.5)
nRBC: 0 % (ref 0.0–0.2)

## 2022-01-07 LAB — COMPREHENSIVE METABOLIC PANEL
ALT: 10 U/L (ref 0–44)
AST: 23 U/L (ref 15–41)
Albumin: 3.5 g/dL (ref 3.5–5.0)
Alkaline Phosphatase: 104 U/L (ref 38–126)
Anion gap: 27 — ABNORMAL HIGH (ref 5–15)
BUN: 44 mg/dL — ABNORMAL HIGH (ref 8–23)
CO2: 19 mmol/L — ABNORMAL LOW (ref 22–32)
Calcium: 10.3 mg/dL (ref 8.9–10.3)
Chloride: 96 mmol/L — ABNORMAL LOW (ref 98–111)
Creatinine, Ser: 1.59 mg/dL — ABNORMAL HIGH (ref 0.61–1.24)
GFR, Estimated: 45 mL/min — ABNORMAL LOW (ref >60)
Glucose, Bld: 131 mg/dL — ABNORMAL HIGH (ref 70–99)
Potassium: 4.5 mmol/L (ref 3.5–5.1)
Sodium: 142 mmol/L (ref 135–145)
Total Bilirubin: 1.2 mg/dL (ref 0.3–1.2)
Total Protein: 7 g/dL (ref 6.5–8.1)

## 2022-01-07 LAB — TROPONIN I (HIGH SENSITIVITY)
Troponin I (High Sensitivity): 12 ng/L (ref <18)
Troponin I (High Sensitivity): 11 ng/L (ref <18)

## 2022-01-07 LAB — LIPASE, BLOOD: Lipase: 42 U/L (ref 11–51)

## 2022-01-07 LAB — LACTIC ACID, PLASMA
Lactic Acid, Venous: 2.1 mmol/L (ref 0.5–1.9)
Lactic Acid, Venous: 1.8 mmol/L (ref 0.5–1.9)

## 2022-01-07 LAB — CBG MONITORING, ED: Glucose-Capillary: 113 mg/dL — ABNORMAL HIGH (ref 70–99)

## 2022-01-07 LAB — RESP PANEL BY RT-PCR (FLU A&B, COVID) ARPGX2
Influenza A by PCR: NEGATIVE
Influenza B by PCR: NEGATIVE
SARS Coronavirus 2 by RT PCR: NEGATIVE

## 2022-01-07 LAB — MAGNESIUM: Magnesium: 2.6 mg/dL — ABNORMAL HIGH (ref 1.7–2.4)

## 2022-01-07 MED ORDER — INSULIN ASPART 100 UNIT/ML IJ SOLN
0.0000 [IU] | Freq: Every day | INTRAMUSCULAR | Status: DC
  Filled 2022-01-07: qty 0.05

## 2022-01-07 MED ORDER — SODIUM CHLORIDE 0.9 % IV BOLUS
1000.0000 mL | Freq: Once | INTRAVENOUS | Status: AC
  Administered 2022-01-07: 1000 mL via INTRAVENOUS

## 2022-01-07 MED ORDER — SODIUM CHLORIDE 0.9 % IV SOLN: INTRAVENOUS | Status: AC

## 2022-01-07 MED ORDER — INSULIN ASPART 100 UNIT/ML IJ SOLN
0.0000 [IU] | Freq: Three times a day (TID) | INTRAMUSCULAR | Status: DC
  Administered 2022-01-09: 2 [IU] via SUBCUTANEOUS
  Administered 2022-01-09: 1 [IU] via SUBCUTANEOUS
  Administered 2022-01-10: 3 [IU] via SUBCUTANEOUS
  Administered 2022-01-11 – 2022-01-12 (×4): 2 [IU] via SUBCUTANEOUS
  Filled 2022-01-07: qty 0.09

## 2022-01-07 MED ORDER — ACETAMINOPHEN 650 MG RE SUPP: 650.0000 mg | Freq: Four times a day (QID) | RECTAL | Status: DC | PRN

## 2022-01-07 MED ORDER — ACETAMINOPHEN 325 MG PO TABS: 650.0000 mg | ORAL_TABLET | Freq: Four times a day (QID) | ORAL | Status: DC | PRN

## 2022-01-07 NOTE — Telephone Encounter (Signed)
Pt is on the way to the ED--BP 74/62.  No eating or drinking. ?

## 2022-01-07 NOTE — Assessment & Plan Note (Addendum)
Continue with mirtazapine and bupropion.  ?For dementia on donepezil and memantine.  ?

## 2022-01-07 NOTE — Assessment & Plan Note (Addendum)
Patient was placed on insulin sliding scale for glucose cover and monitoring. ?His glucose remained well controlled.  ? ?Continue with statin therapy.  ?

## 2022-01-07 NOTE — Assessment & Plan Note (Deleted)
On statin therapy 

## 2022-01-07 NOTE — ED Triage Notes (Signed)
Pt BIB [facility] from home. Patient has been steadily declining over the past few weeks. Patient has not been eating/drinking and has had a 30 lbs weight loss in 3 weeks. Patient was hypotensive upon EMS arrival. Patient c/o dizziness and weakness. ? ?Vital signs were: ? ?92/64 ?96-HR ?97% RA ?133-CBG ?

## 2022-01-07 NOTE — Assessment & Plan Note (Deleted)
Likely due to AKI and mild lactic acidosis. ?-Continue gentle IV fluid hydration and monitor BMP. ?

## 2022-01-07 NOTE — Assessment & Plan Note (Addendum)
Echo done in October 2021 showing EF 40 to 22%, grade 1 diastolic dysfunction. Not on any diuretics.   ? ?No clinical sings of exacerbation ?Patient will follow up as outpatient.  ?

## 2022-01-07 NOTE — Assessment & Plan Note (Addendum)
Multifactorial failure to thrive ?Today with no chest pain or dyspnea. ?He has been able to work with physical and occupational therapy. ? ?At the time of his discharge he was considered safe for discharge home with home health services.  ? ?

## 2022-01-07 NOTE — Assessment & Plan Note (Addendum)
Hypokalemia.  ?Patient was placed on IV fluids with good toleration.  ?Renal function has improved, at the time of his discharge his serum cr is 0,97 with K at 3,8 and serum bicarbonate at 26. ?Patient is tolerating po well.  ? ?He had urinary retention and required Foley catheter placement. Plan to do a voiding trial before his discharge.  ? ?Plan to follow up as outpatient.  ?

## 2022-01-07 NOTE — Subjective & Objective (Addendum)
Patient is a 76 year old male with a past medical history of dementia, hypertension, hyperlipidemia, noninsulin-dependent type 2 diabetes, chronic combined CHF, second-degree AV block status post pacemaker placement, CKD stage IIIa, anxiety, history of prostate cancer.  Hospitalized 10/20/2021-10/24/2021 for mechanical fall leading to pelvic hematoma for which he underwent coil embolization of pelvic arteries.  He also had hematuria and urinary retention thought to be related to pelvic hematoma and had a Foley catheter inserted.  Hematuria resolved and patient passed a voiding trial prior to discharge to [facility] on 10/24/2021 where he stayed for 10 days and Plavix was resumed.  Foley catheter was placed at [facility] due to urinary retention and he was discharged to home.  At home, he started having hematuria again and presented again to the hospital on 11/05/2021 after he had another fall which resulted in a small subdural hematoma.  Plavix was held due to subdural hematoma and hematuria.  Urology was consulted and started oxybutynin, stopped Urecholine and continued finasteride.  Discharged to [facility] on 11/10/2021 where he stayed for 10 days.  He was [facility], discharged with Foley catheter due to urinary retention. ? ?Patient presents to the ED today for evaluation of hypotension and failure to thrive.  Family reported patient having poor appetite, significant weight loss, and generalized weakness.  Requested nursing home placement.  In the ED, vital signs stable.  Labs showing no leukocytosis or anemia.  Platelet count slightly low but no significant change compared to labs done 11/16/2021.  Bicarb 19, anion gap 27.  Blood glucose 131.  BUN 44, creatinine 1.5 (baseline 1.1-1.2).  Lipase and LFTs normal.  Lactic acid 2.1 > 1.8.  High-sensitivity troponin negative x2.  COVID and influenza PCR negative.  UA pending.  Chest x-ray not suggestive of pneumonia. Patient was given 1 L normal saline bolus. ? ?Patient states  he has lost 18 pounds in the last 3 weeks.  States his appetite is good but he does not eat much as he does not like the food that his wife gives him.  States his wife does not have time to cook 3 meals a day because she works as a Pharmacist, hospital.  Denies dysphagia or odynophagia, nausea, vomiting, abdominal pain, or diarrhea.  Denies hematemesis, hematochezia, or melena.  States he no longer has a Foley catheter and is able to urinate on his own without any difficulty.  Denies hematuria, dysuria, or urinary frequency/urgency.  Patient states he has enough energy to walk 3 to 4 miles a day and states he could even walk 5 miles a day if he wanted to.  Denies lightheadedness/dizziness or any falls since discharge from [facility].  Denies fevers, chills, cough, shortness of breath, or chest pain.  No other complaints. ? ? ? ?

## 2022-01-07 NOTE — ED Provider Notes (Signed)
[facility] DEPT Provider Note   CSN: [id number] Arrival date & time: 01/07/22  1620     History  No chief complaint on file.   Kevin [name] is a 76 y.o. male.  Patient had a pelvic fracture in December and a subdural in January.  Sounds like since then he is been declining.  Was at rehab and now at home.  States he does not have any appetite and is getting progressively weaker.  Losing weight.  He denies any specific complaints.  He states he still walks although sometimes needs to use a walker.  No recent falls.  No fevers chills headache chest pain abdominal pain nausea vomiting diarrhea constipation or urinary symptoms.  Per EMS wife thinks the patient needs to be placed.  The history is provided by the patient and the EMS personnel.  Weakness Severity:  Severe Onset quality:  Gradual Duration:  6 weeks Timing:  Constant Progression:  Worsening Chronicity:  New Relieved by:  Nothing Worsened by:  Activity Ineffective treatments:  Drinking fluids and eating Associated symptoms: no abdominal pain, no aphasia, no chest pain, no cough, no diarrhea, no dysphagia, no dysuria, no falls, no fever, no foul-smelling urine, no headaches, no loss of consciousness, no nausea, no shortness of breath, no syncope and no vomiting       Home Medications Prior to Admission medications   Medication Sig Start Date End Date Taking? Authorizing Provider  acetaminophen (TYLENOL) 325 MG tablet Take 1-2 tablets (325-650 mg total) by mouth every 4 (four) hours as needed for mild pain. 11/19/21   Angiulli, Lavon Paganini, PA-C  atorvastatin (LIPITOR) 80 MG tablet Take 0.5 tablets (40 mg total) by mouth every evening. 11/19/21   Angiulli, Lavon Paganini, PA-C  buPROPion (WELLBUTRIN XL) 150 MG 24 hr tablet Take 2 tablets in AM, 1 tablet in PM 09/10/21   Cameron Sprang, MD  Carboxymethylcellulose Sodium (EYE DROPS OP) Place 1 drop into both eyes 3 (three) times daily. Given by [facility] doctor  to help astigmtism    [provider]  cholecalciferol (VITAMIN D) 25 MCG (1000 UNIT) tablet Take 1 tablet (1,000 Units total) by mouth daily. 11/19/21   Angiulli, Lavon Paganini, PA-C  Continuous Blood Gluc Sensor (FREESTYLE LIBRE 14 DAY SENSOR) MISC SMARTSIG:1 Each Topical Every 2 Weeks 11/30/21   [provider]  donepezil (ARICEPT) 5 MG tablet Take 1 tablet (5 mg total) by mouth daily. 09/10/21   Cameron Sprang, MD  finasteride (PROSCAR) 5 MG tablet Take 1 tablet (5 mg total) by mouth daily. Patient not taking: Reported on 01/04/2022 11/19/21   Angiulli, Lavon Paganini, PA-C  HYDROcodone-acetaminophen (NORCO/VICODIN) 5-325 MG tablet Take 1 tablet by mouth every 4 (four) hours as needed for severe pain. Patient not taking: Reported on 01/04/2022 11/19/21   Angiulli, Lavon Paganini, PA-C  loratadine (CLARITIN) 10 MG tablet Take 1 tablet (10 mg total) by mouth daily. 11/19/21   Angiulli, Lavon Paganini, PA-C  magnesium gluconate (MAGONATE) 500 MG tablet Take 0.5 tablets (250 mg total) by mouth daily. 11/19/21   Angiulli, Lavon Paganini, PA-C  memantine (NAMENDA) 10 MG tablet Take 1 tablet twice a day Patient taking differently: Take 10 mg by mouth 2 (two) times daily. 09/10/21   Cameron Sprang, MD  metFORMIN (GLUCOPHAGE) 500 MG tablet Take 1 tablet (500 mg total) by mouth 2 (two) times daily with a meal. 11/19/21   Angiulli, Lavon Paganini, PA-C  mirtazapine (REMERON SOL-TAB) 15 MG disintegrating tablet  Take 1 tablet (15 mg total) by mouth at bedtime. 01/04/22   Burchette, Alinda Sierras, MD  polyethylene glycol (MIRALAX / GLYCOLAX) 17 g packet Take 17 g by mouth daily as needed for mild constipation. Patient not taking: Reported on 01/04/2022 11/19/21   Cathlyn Parsons, PA-C      Allergies    Sulfa antibiotics and 5-alpha reductase inhibitors    Review of Systems   Review of Systems  Constitutional:  Positive for appetite change. Negative for fever.  HENT:  Negative for sore throat.   Eyes:  Negative for visual  disturbance.  Respiratory:  Negative for cough and shortness of breath.   Cardiovascular:  Negative for chest pain and syncope.  Gastrointestinal:  Negative for abdominal pain, diarrhea, dysphagia, nausea and vomiting.  Genitourinary:  Negative for dysuria.  Musculoskeletal:  Negative for falls and joint swelling.  Skin:  Negative for rash.  Neurological:  Positive for weakness. Negative for loss of consciousness and headaches.   Physical Exam Updated Vital Signs BP 103/72 (BP Location: Left Arm)    Pulse 88    Temp 97.6 F (36.4 C) (Axillary)    Resp 14    Ht 6\' 4"  (1.93 m)    Wt 51.7 kg    SpO2 99%    BMI 13.87 kg/m  Physical Exam Vitals and nursing note reviewed.  Constitutional:      General: He is not in acute distress.    Appearance: Normal appearance. He is underweight.  HENT:     Head: Normocephalic and atraumatic.  Eyes:     Conjunctiva/sclera: Conjunctivae normal.  Cardiovascular:     Rate and Rhythm: Normal rate and regular rhythm.     Heart sounds: No murmur heard. Pulmonary:     Effort: Pulmonary effort is normal. No respiratory distress.     Breath sounds: Normal breath sounds.  Abdominal:     Palpations: Abdomen is soft.     Tenderness: There is no abdominal tenderness. There is no guarding or rebound.  Musculoskeletal:        General: No swelling or deformity. Normal range of motion.     Cervical back: Neck supple.  Skin:    General: Skin is warm and dry.     Capillary Refill: Capillary refill takes less than 2 seconds.  Neurological:     General: No focal deficit present.     Mental Status: He is alert and oriented to person, place, and time.     Cranial Nerves: No cranial nerve deficit.     Sensory: No sensory deficit.     Motor: No weakness.    ED Results / Procedures / Treatments   Labs (all labs ordered are listed, but only abnormal results are displayed) Labs Reviewed  COMPREHENSIVE METABOLIC PANEL - Abnormal; Notable for the following  components:      Result Value   Chloride 96 (*)    CO2 19 (*)    Glucose, Bld 131 (*)    BUN 44 (*)    Creatinine, Ser 1.59 (*)    GFR, Estimated 45 (*)    Anion gap 27 (*)    All other components within normal limits  LACTIC ACID, PLASMA - Abnormal; Notable for the following components:   Lactic Acid, Venous 2.1 (*)    All other components within normal limits  CBC WITH DIFFERENTIAL/PLATELET - Abnormal; Notable for the following components:   MCV 101.8 (*)    Platelets 125 (*)    All other  components within normal limits  MAGNESIUM - Abnormal; Notable for the following components:   Magnesium 2.6 (*)    All other components within normal limits  CBG MONITORING, ED - Abnormal; Notable for the following components:   Glucose-Capillary 113 (*)    All other components within normal limits  RESP PANEL BY RT-PCR (FLU A&B, COVID) ARPGX2  LACTIC ACID, PLASMA  LIPASE, BLOOD  URINALYSIS, ROUTINE W REFLEX MICROSCOPIC  BASIC METABOLIC PANEL  HEMOGLOBIN A1C  TROPONIN I (HIGH SENSITIVITY)  TROPONIN I (HIGH SENSITIVITY)    EKG EKG Interpretation  Date/Time:  Thursday January 07 2022 16:34:13 EST Ventricular Rate:  90 PR Interval:  194 QRS Duration: 161 QT Interval:  434 QTC Calculation: 532 R Axis:   -88 Text Interpretation: ATRIAL SENSING Electronic ventricular pacemaker Confirmed by Aletta Edouard [phone]) on 01/07/2022 4:45:55 PM  Radiology DG Chest Port 1 View  Result Date: 01/07/2022 CLINICAL DATA:  Weakness and dizziness.  Weight loss. EXAM: PORTABLE CHEST 1 VIEW COMPARISON:  11/05/2021 FINDINGS: COPD with pulmonary hyperinflation. Lungs are clear without infiltrate or effusion Heart size normal. Negative for heart failure. AICD and pacer unchanged from the prior study. IMPRESSION: COPD.  No acute cardiopulmonary abnormality. Electronically Signed   By: Franchot Gallo M.D.   On: 01/07/2022 17:04    Procedures Procedures    Medications Ordered in ED Medications   acetaminophen (TYLENOL) tablet 650 mg (has no administration in time range)    Or  acetaminophen (TYLENOL) suppository 650 mg (has no administration in time range)  0.9 %  sodium chloride infusion (has no administration in time range)  insulin aspart (novoLOG) injection 0-9 Units (has no administration in time range)  insulin aspart (novoLOG) injection 0-5 Units (has no administration in time range)  sodium chloride 0.9 % bolus 1,000 mL (0 mLs Intravenous Stopped 01/07/22 1853)    ED Course/ Medical Decision Making/ A&P Clinical Course as of 01/07/22 2321  Thu Jan 07, 2022  1646 Review of records in epic, it looks like patient was taken off his metformin and started on Remeron.  They have been trying other modalities to get his weight up.  Palliative involved. [MB]  3235 Chest x-ray interpreted by me as COPD pacemaker no acute infiltrate.  Awaiting radiology reading. [MB]  55 Wife is here now and pending a different picture of how patient is doing at home.  She said he cannot even walk anymore and she cannot lift him.  She is hoping we could get him admitted to the hospital so that he can work on his strength.  He may need placement. [MB]  1924 Discussed with [facility] hospitalist Dr. Marlowe Sax who will evaluate patient for admission. [MB]    Clinical Course User Index [MB] Hayden Rasmussen, MD                           Medical Decision Making Amount and/or Complexity of Data Reviewed Labs: ordered. Radiology: ordered.  Risk Decision regarding hospitalization.  This patient complains of generalized weakness poor p.o. intake dehydration; this involves an extensive number of treatment Options and is a complaint that carries with it a high risk of complications and morbidity. The differential includes dehydration, metabolic derangement, AKI, infection  I ordered, reviewed and interpreted labs, which included CBC with normal white count normal hemoglobin, chemistries with low bicarb  elevated BUN and creatinine elevated, magnesium elevated at 2.6, troponins flat, lactate mildly elevated and cleared with fluids I  ordered medication IV fluids and reviewed PMP when indicated. I ordered imaging studies which included chest x-ray and I independently    visualized and interpreted imaging which showed no acute infiltrates Additional history obtained from EMS and patient's wife Previous records obtained and reviewed in epic including multiple televisits with primary care doctor I consulted [facility] hospitalist Dr. Marlowe Sax and discussed lab and imaging findings and discussed disposition.  Cardiac monitoring reviewed, normal sinus rhythm Social determinants considered, patient having difficulty getting into primary care visits due to his ambulatory dysfunction Critical Interventions: None  After the interventions stated above, I reevaluated the patient and found patient remains weak. Admission and further testing considered, he would benefit from admission for hydration, physical therapy consult, TOC consult.  Kevin [name] was evaluated in Emergency Department on 01/07/2022 for the symptoms described in the history of present illness. He was evaluated in the context of the global COVID-19 pandemic, which necessitated consideration that the patient might be at risk for infection with the SARS-CoV-2 virus that causes COVID-19. Institutional protocols and algorithms that pertain to the evaluation of patients at risk for COVID-19 are in a state of rapid change based on information released by regulatory bodies including the CDC and federal and state organizations. These policies and algorithms were followed during the patient's care in the ED.          Final Clinical Impression(s) / ED Diagnoses Final diagnoses:  Failure to thrive in adult  Lactic acidosis  AKI (acute kidney injury) ([place])  High anion gap metabolic acidosis  Generalized weakness    Rx / DC Orders ED Discharge  Orders     None         Hayden Rasmussen, MD 01/07/22 2326

## 2022-01-07 NOTE — H&P (Signed)
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  Hospitalized 10/20/2021-10/24/2021 for mechanical fall leading to pelvic hematoma for which he underwent coil embolization of pelvic arteries.  He also had hematuria and urinary retention thought to be related to pelvic hematoma and had a Foley catheter inserted.  Hematuria resolved and patient passed a voiding trial prior to discharge to [facility] on 10/24/2021 where he stayed for 10 days and Plavix was resumed.  Foley catheter was placed at [facility] due to urinary retention and he was discharged to home.  At home, he started having hematuria again and presented again to the hospital on 11/05/2021 after he had another fall which resulted in a small subdural hematoma.  Plavix was held due to subdural hematoma and hematuria.  Urology was consulted and started oxybutynin, stopped Urecholine and continued finasteride.  Discharged to [facility] on 11/10/2021 where he stayed for 10 days.  He was [facility] for UTI at [facility], discharged with Foley catheter due to urinary retention.  Patient presents to the ED today for evaluation of hypotension and failure to thrive.  Family reported patient having poor appetite, significant weight loss, and generalized weakness.  Requested nursing home placement.  In the ED, vital signs stable.  Labs showing no leukocytosis or anemia.  Platelet count slightly low but no significant change compared to labs done 11/16/2021.  Bicarb 19, anion gap 27.  Blood  glucose 131.  BUN 44, creatinine 1.5 (baseline 1.1-1.2).  Lipase and LFTs normal.  Lactic acid 2.1 > 1.8.  High-sensitivity troponin negative x2.  COVID and influenza PCR negative.  UA pending.  Chest x-ray not suggestive of pneumonia. Patient was given 1 L normal saline bolus.  Patient states he has lost 18 pounds in the last 3 weeks.  States his appetite is good but he does not eat much as he does not like the food that his wife gives him.  States his wife does not have time to cook 3 meals a day because she works as a Pharmacist, hospital.  Denies dysphagia or odynophagia, nausea, vomiting, abdominal pain, or diarrhea.  Denies hematemesis, hematochezia, or melena.  States he no longer has a Foley catheter and is able to urinate on his own without any difficulty.  Denies hematuria, dysuria, or urinary frequency/urgency.  Patient states he has enough energy to walk 3 to 4 miles a day and states he could even walk 5 miles a day if he wanted to.  Denies lightheadedness/dizziness or any falls since discharge from [facility].  Denies fevers, chills, cough, shortness of breath, or chest pain.  No other complaints.     Review of Systems:  Review of Systems  All other systems reviewed and are negative.  Past Medical History:  Diagnosis Date   Agent orange exposure    in [place]   Anemia    Dementia ([place])    Heart block AV second degree    a. s/p PPM implant with subsequent CRTD upgrade   High cholesterol    History of colon polyps  LBBB (left bundle branch block)    Nonischemic cardiomyopathy ([place])    a. MDT CRTD upgrade 2016   Pacemaker 08/27/2013   Dual-chamber Medtronic Adapta implanted January 2013 for bradycardia with alternating bundle branch block and 2:1 atrioventricular block    Prostate cancer ([place])    "not been treated yet; on a wait and see" (01/22/2015)   Type II diabetes mellitus ([place])     Past Surgical History:  Procedure Laterality Date   BASAL CELL CARCINOMA EXCISION     "back, face, neck"    BI-VENTRICULAR IMPLANTABLE CARDIOVERTER DEFIBRILLATOR UPGRADE N/A 01/22/2015   MDT CRTD upgrade by Dr Rayann Heman   BIV ICD GENERATOR CHANGEOUT N/A 08/28/2020   Procedure: BIV ICD GENERATOR CHANGEOUT;  Surgeon: [name], MD;  Location: [facility];  Service: Cardiovascular;  Laterality: N/A;   CARDIAC CATHETERIZATION  01/27/2012   normal coronaries, dilated LV c/w nonischemic cardiomyopathy   EXAMINATION UNDER ANESTHESIA  04/06/2012   Procedure: EXAM UNDER ANESTHESIA;  Surgeon: Marcello Moores A. Cornett, MD;  Location: [facility];  Service: General;  Laterality: N/A;   INCISION AND DRAINAGE PERIRECTAL ABSCESS  ~ 2010; 2015   INGUINAL HERNIA REPAIR Left 1980   IR ANGIOGRAM PELVIS SELECTIVE OR SUPRASELECTIVE  10/20/2021   IR ANGIOGRAM VISCERAL SELECTIVE  10/20/2021   IR EMBO ART  VEN HEMORR LYMPH EXTRAV  INC GUIDE ROADMAPPING  10/20/2021   IR US GUIDE VASC ACCESS LEFT  10/20/2021   LEFT HEART CATHETERIZATION WITH CORONARY ANGIOGRAM N/A 01/27/2012   Procedure: LEFT HEART CATHETERIZATION WITH CORONARY ANGIOGRAM;  Surgeon: Troy Sine, MD;  Location[facility] C[facility] LAB;  Service: Cardiovascular;  Laterality: N/A;   NM MYOCAR PERF WALL MOTION  01/21/2012   mod-severe perfusion defect due to infarct/scar w/mild perinfarct ischemia in the apical,basal inferoseptal,basal inferior,mid inferoseptal,midinferior and apical inferior regions   pacemaker battery     PERMANENT PACEMAKER INSERTION N/A 12/01/2011   Medtronic implanted by Dr Sallyanne Kuster   PROSTATE BIOPSY     PROSTATE BIOPSY     shrapnel surgery     "in [place]"   TONSILLECTOMY       reports that he quit smoking about 37 years ago. His smoking use included cigarettes. He has a 20.00 pack-year smoking history. He has never used smokeless tobacco. He reports current alcohol use. He reports that he does not use drugs.  Allergies  Allergen Reactions   Sulfa Antibiotics Swelling    Swelling of lips only.   5-Alpha Reductase Inhibitors     Patient and wife  state OK for patient to take finasteride - has not taken it before, NKA    Family History  Problem Relation Age of Onset   Ovarian cancer Mother    Cancer Mother        ?uterine   Leukemia Father    Heart disease Father    Diabetes Neg Hx     Prior to Admission medications   Medication Sig Start Date End Date Taking? Authorizing Provider  acetaminophen (TYLENOL) 325 MG tablet Take 1-2 tablets (325-650 mg total) by mouth every 4 (four) hours as needed for mild pain. 11/19/21   Angiulli, [place] Paganini, PA-C  atorvastatin (LIPITOR) 80 MG tablet Take 0.5 tablets (40 mg total) by mouth every evening. 11/19/21   Angiulli, [place]on Paganini, PA-C  buPROPion (WELLBUTRIN XL) 150 MG 24 hr tablet Take 2 tablets in AM, 1 tablet in PM 09/10/21   Cameron Sprang, MD  Carboxymethylcellulose Sodium (EYE DROPS OP) Place 1 drop  into both eyes 3 (three) times daily. Given by [facility] doctor to help astigmtism    [provider]  cholecalciferol (VITAMIN D) 25 MCG (1000 UNIT) tablet Take 1 tablet (1,000 Units total) by mouth daily. 11/19/21   Angiulli, [place] Paganini, PA-C  Continuous Blood Gluc Sensor (FREESTYLE LIBRE 14 DAY SENSOR) MISC SMARTSIG:1 Each Topical Every 2 Weeks 11/30/21   [provider]  donepezil (ARICEPT) 5 MG tablet Take 1 tablet (5 mg total) by mouth daily. 09/10/21   Cameron Sprang, MD  finasteride (PROSCAR) 5 MG tablet Take 1 tablet (5 mg total) by mouth daily. Patient not taking: Reported on 01/04/2022 11/19/21   Angiulli, [place] Paganini, PA-C  HYDROcodone-acetaminophen (NORCO/VICODIN) 5-325 MG tablet Take 1 tablet by mouth every 4 (four) hours as needed for severe pain. Patient not taking: Reported on 01/04/2022 11/19/21   Angiulli, [place]on Paganini, PA-C  loratadine (CLARITIN) 10 MG tablet Take 1 tablet (10 mg total) by mouth daily. 11/19/21   Angiulli, Lavon Paganini, PA-C  magnesium gluconate (MAGONATE) 500 MG tablet Take 0.5 tablets (250 mg total) by mouth daily. 11/19/21   Angiulli, [place]on Paganini, PA-C  memantine (NAMENDA)  10 MG tablet Take 1 tablet twice a day Patient taking differently: Take 10 mg by mouth 2 (two) times daily. 09/10/21   Cameron Sprang, MD  metFORMIN (GLUCOPHAGE) 500 MG tablet Take 1 tablet (500 mg total) by mouth 2 (two) times daily with a meal. 11/19/21   Angiulli, [place]on Paganini, PA-C  mirtazapine (REMERON SOL-TAB) 15 MG disintegrating tablet Take 1 tablet (15 mg total) by mouth at bedtime. 01/04/22   Burchette, Alinda Sierras, MD  polyethylene glycol (MIRALAX / GLYCOLAX) 17 g packet Take 17 g by mouth daily as needed for mild constipation. Patient not taking: Reported on 01/04/2022 11/19/21   Cathlyn Parsons, PA-C    Physical Exam: Vitals:   01/07/22 1700 01/07/22 1800 01/07/22 1830 01/07/22 1900  BP: 105/76 103/68 (!) 105/92 108/69  Pulse: 88 76 77 73  Resp: (!) 22 18  12   Temp:      TempSrc:      SpO2: 98% 98% 100% 99%  Weight:      Height:        Physical Exam Vitals and nursing note reviewed.  Constitutional:      General: He is not in acute distress.    Comments: Cachectic  HENT:     Head: Normocephalic and atraumatic.     Mouth/Throat:     Mouth: Mucous membranes are dry.  Eyes:     Extraocular Movements: Extraocular movements intact.     Conjunctiva/sclera: Conjunctivae normal.  Cardiovascular:     Rate and Rhythm: Normal rate and regular rhythm.     Pulses: Normal pulses.  Pulmonary:     Effort: Pulmonary effort is normal. No respiratory distress.     Breath sounds: Normal breath sounds. No wheezing or rales.  Abdominal:     General: Bowel sounds are normal. There is no distension.     Palpations: Abdomen is soft.     Tenderness: There is no abdominal tenderness.  Musculoskeletal:        General: No swelling or tenderness.     Cervical back: Normal range of motion and neck supple.  Skin:    General: Skin is warm and dry.  Neurological:     General: No focal deficit present.     Mental Status: He is alert and oriented to person, place, and time.  Labs on  Admission: I have personally reviewed following labs and imaging studies  CBC: Recent Labs  Lab 01/07/22 1639  WBC 8.5  NEUTROABS 7.3  HGB 16.6  HCT 51.3  MCV 101.8*  PLT 756*   Basic Metabolic Panel: Recent Labs  Lab 01/07/22 1639 01/07/22 1657  NA 142  --   K 4.5  --   CL 96*  --   CO2 19*  --   GLUCOSE 131*  --   BUN 44*  --   CREATININE 1.59*  --   CALCIUM 10.3  --   MG  --  2.6*   GFR: Estimated Creatinine Clearance: 28.9 mL/min (A) (by C-G formula based on SCr of 1.59 mg/dL (H)). Liver Function Tests: Recent Labs  Lab 01/07/22 1639  AST 23  ALT 10  ALKPHOS 104  BILITOT 1.2  PROT 7.0  ALBUMIN 3.5   Recent Labs  Lab 01/07/22 1639  LIPASE 42   No results for input(s): AMMONIA in the last 168 hours. Coagulation Profile: No results for input(s): INR, PROTIME in the last 168 hours. Cardiac Enzymes: No results for input(s): CKTOTAL, CKMB, CKMBINDEX, TROPONINI in the last 168 hours. BNP (last 3 results) No results for input(s): PROBNP in the last 8760 hours. HbA1C: No results for input(s): HGBA1C in the last 72 hours. CBG: No results for input(s): GLUCAP in the last 168 hours. Lipid Profile: No results for input(s): CHOL, HDL, LDLCALC, TRIG, CHOLHDL, LDLDIRECT in the last 72 hours. Thyroid Function Tests: No results for input(s): TSH, T4TOTAL, FREET4, T3FREE, THYROIDAB in the last 72 hours. Anemia Panel: No results for input(s): VITAMINB12, FOLATE, FERRITIN, TIBC, IRON, RETICCTPCT in the last 72 hours. Urine analysis:    Component Value Date/Time   COLORURINE YELLOW 11/15/2021 2041   APPEARANCEUR CLOUDY (A) 11/15/2021 2041   LABSPEC >1.030 (H) 11/15/2021 2041   PHURINE 5.5 11/15/2021 2041   GLUCOSEU NEGATIVE 11/15/2021 2041   GLUCOSEU NEGATIVE 02/06/2020 1609   HGBUR MODERATE (A) 11/15/2021 2041   BILIRUBINUR NEGATIVE 11/15/2021 2041   KETONESUR NEGATIVE 11/15/2021 2041   PROTEINUR NEGATIVE 11/15/2021 2041   UROBILINOGEN 0.2 02/06/2020 1609    NITRITE POSITIVE (A) 11/15/2021 2041   LEUKOCYTESUR LARGE (A) 11/15/2021 2041    Radiological Exams on Admission: I have personally reviewed images DG Chest Port 1 View  Result Date: 01/07/2022 CLINICAL DATA:  Weakness and dizziness.  Weight loss. EXAM: PORTABLE CHEST 1 VIEW COMPARISON:  11/05/2021 FINDINGS: COPD with pulmonary hyperinflation. Lungs are clear without infiltrate or effusion Heart size normal. Negative for heart failure. AICD and pacer unchanged from the prior study. IMPRESSION: COPD.  No acute cardiopulmonary abnormality. Electronically Signed   By: Franchot Gallo M.D.   On: 01/07/2022 17:04    EKG: I have personally reviewed EKG: Interpretation limited secondary to paced rhythm.  Assessment/Plan Principal Problem:   Failure to thrive in adult Active Problems:   Acute renal failure superimposed on stage 3a chronic kidney disease (HCC)   High anion gap metabolic acidosis   Lactic acidosis   Chronic combined systolic and diastolic heart failure (HCC)   Hypertension   DM2 (diabetes mellitus, type 2) (HCC)   Hyperlipidemia   Depression   Dementia (HCC)    Assessment and Plan: * Failure to thrive in adult Patient brought to the ED by his wife due to concern for poor appetite, significant weight loss, and generalized weakness.  No fever or leukocytosis.  Chest x-ray not suggestive of pneumonia.  UA pending but he  is not endorsing any UTI symptoms.  Lipase and LFTs normal.  COVID and influenza PCR negative.  Patient denies any GI symptoms other than poor oral intake which he attributes to not liking the food at home.  Per review of chart, he had a colonoscopy done in October 2014 which revealed tubular adenomas and repeat colonoscopy was recommended in 3 years but not done. -Palliative care consulted for goals of care discussion given patient's age, comorbidities, and history of frequent hospitalizations.  Nutritionist consulted.  PT/OT and TOC consulted for placement.  Fall  precautions.   Acute renal failure superimposed on stage 3a chronic kidney disease ([place]) Likely prerenal azotemia due to poor oral intake. BUN 44, creatinine 1.5 (baseline 1.1-1.2).  -Continue gentle IV fluid hydration.  Monitor renal function and urine output.  Avoid nephrotoxic agents.  Lactic acidosis Mild and resolved after fluid bolus.  Likely due to dehydration.  No infectious signs or symptoms.  High anion gap metabolic acidosis Likely due to AKI and mild lactic acidosis. -Continue gentle IV fluid hydration and monitor BMP.  Chronic combined systolic and diastolic heart failure ([place]) Echo done in October 2021 showing EF 40 to 01%, grade 1 diastolic dysfunction. Not on any diuretics.  No signs of volume overload at this time. -Receiving gentle IV fluid hydration given AKI.  Monitor volume status closely.  Dementia ([place]) -Continue home medication after pharmacy med rec is done.  Follow delirium precautions.  Depression -Continue home meds after pharmacy med rec is done  Hyperlipidemia -Continue statin after pharmacy med rec is done.  DM2 (diabetes mellitus, type 2) (HCC) -Check A1c.  Sliding scale insulin sensitive ACHS.  Hold metformin.  Hypertension Stable.  Not on any antihypertensives. -Monitor blood pressure closely   DVT prophylaxis: SCDs Code Status: Full Code (discussed with the patient) Family Communication: No family available at this time. Disposition Plan: Anticipate discharge to SNF. Consults called: Palliative care, nutritionist, PT/OT Admission status: Observation, Med-Surg It is my clinical opinion that referral for OBSERVATION is reasonable and necessary in this patient based on the above information provided. The aforementioned taken together are felt to place the patient at high risk for further clinical deterioration. However, it is anticipated that the patient may be medically stable for discharge from the hospital within 24 to 48 hours.    Shela Leff, MD [facility] 01/07/2022, 8:56 PM

## 2022-01-07 NOTE — Assessment & Plan Note (Addendum)
His blood pressure remained well controlled, at the time of his discharge systolic has been in the 931 to 129 mmHg range.  ?Patient is off antihypertensive medications.  ?

## 2022-01-07 NOTE — Assessment & Plan Note (Addendum)
Clinically resolved.  

## 2022-01-07 NOTE — Assessment & Plan Note (Addendum)
- 

## 2022-01-08 DIAGNOSIS — E1122 Type 2 diabetes mellitus with diabetic chronic kidney disease: Secondary | ICD-10-CM | POA: Diagnosis present

## 2022-01-08 DIAGNOSIS — F05 Delirium due to known physiological condition: Secondary | ICD-10-CM | POA: Diagnosis not present

## 2022-01-08 DIAGNOSIS — N390 Urinary tract infection, site not specified: Secondary | ICD-10-CM | POA: Diagnosis present

## 2022-01-08 DIAGNOSIS — J449 Chronic obstructive pulmonary disease, unspecified: Secondary | ICD-10-CM | POA: Diagnosis present

## 2022-01-08 DIAGNOSIS — Z681 Body mass index (BMI) 19 or less, adult: Secondary | ICD-10-CM | POA: Diagnosis not present

## 2022-01-08 DIAGNOSIS — N179 Acute kidney failure, unspecified: Secondary | ICD-10-CM | POA: Diagnosis present

## 2022-01-08 DIAGNOSIS — Z7189 Other specified counseling: Secondary | ICD-10-CM | POA: Diagnosis not present

## 2022-01-08 DIAGNOSIS — N1831 Chronic kidney disease, stage 3a: Secondary | ICD-10-CM | POA: Diagnosis present

## 2022-01-08 DIAGNOSIS — R64 Cachexia: Secondary | ICD-10-CM | POA: Diagnosis present

## 2022-01-08 DIAGNOSIS — I959 Hypotension, unspecified: Secondary | ICD-10-CM | POA: Diagnosis present

## 2022-01-08 DIAGNOSIS — Z20822 Contact with and (suspected) exposure to covid-19: Secondary | ICD-10-CM | POA: Diagnosis present

## 2022-01-08 DIAGNOSIS — E7800 Pure hypercholesterolemia, unspecified: Secondary | ICD-10-CM | POA: Diagnosis present

## 2022-01-08 DIAGNOSIS — I5042 Chronic combined systolic (congestive) and diastolic (congestive) heart failure: Secondary | ICD-10-CM | POA: Diagnosis present

## 2022-01-08 DIAGNOSIS — I447 Left bundle-branch block, unspecified: Secondary | ICD-10-CM | POA: Diagnosis present

## 2022-01-08 DIAGNOSIS — E1169 Type 2 diabetes mellitus with other specified complication: Secondary | ICD-10-CM | POA: Diagnosis present

## 2022-01-08 DIAGNOSIS — R627 Adult failure to thrive: Secondary | ICD-10-CM | POA: Diagnosis present

## 2022-01-08 DIAGNOSIS — Z66 Do not resuscitate: Secondary | ICD-10-CM | POA: Diagnosis not present

## 2022-01-08 DIAGNOSIS — G9341 Metabolic encephalopathy: Secondary | ICD-10-CM | POA: Diagnosis present

## 2022-01-08 DIAGNOSIS — E872 Acidosis, unspecified: Secondary | ICD-10-CM | POA: Diagnosis present

## 2022-01-08 DIAGNOSIS — Z515 Encounter for palliative care: Secondary | ICD-10-CM | POA: Diagnosis not present

## 2022-01-08 DIAGNOSIS — F0390 Unspecified dementia without behavioral disturbance: Secondary | ICD-10-CM | POA: Diagnosis not present

## 2022-01-08 DIAGNOSIS — D696 Thrombocytopenia, unspecified: Secondary | ICD-10-CM | POA: Diagnosis present

## 2022-01-08 DIAGNOSIS — F0393 Unspecified dementia, unspecified severity, with mood disturbance: Secondary | ICD-10-CM | POA: Diagnosis present

## 2022-01-08 DIAGNOSIS — I10 Essential (primary) hypertension: Secondary | ICD-10-CM | POA: Diagnosis not present

## 2022-01-08 DIAGNOSIS — F419 Anxiety disorder, unspecified: Secondary | ICD-10-CM | POA: Diagnosis present

## 2022-01-08 DIAGNOSIS — I428 Other cardiomyopathies: Secondary | ICD-10-CM | POA: Diagnosis present

## 2022-01-08 DIAGNOSIS — I441 Atrioventricular block, second degree: Secondary | ICD-10-CM | POA: Diagnosis present

## 2022-01-08 DIAGNOSIS — I130 Hypertensive heart and chronic kidney disease with heart failure and stage 1 through stage 4 chronic kidney disease, or unspecified chronic kidney disease: Secondary | ICD-10-CM | POA: Diagnosis present

## 2022-01-08 LAB — HEMOGLOBIN A1C
Hgb A1c MFr Bld: 5.3 % (ref 4.8–5.6)
Mean Plasma Glucose: 105.41 mg/dL

## 2022-01-08 LAB — URINALYSIS, ROUTINE W REFLEX MICROSCOPIC
Bilirubin Urine: NEGATIVE
Glucose, UA: NEGATIVE mg/dL
Ketones, ur: 20 mg/dL — AB
Nitrite: NEGATIVE
Protein, ur: 30 mg/dL — AB
Specific Gravity, Urine: 1.019 (ref 1.005–1.030)
pH: 5 (ref 5.0–8.0)

## 2022-01-08 LAB — BASIC METABOLIC PANEL
Anion gap: 10 (ref 5–15)
BUN: 38 mg/dL — ABNORMAL HIGH (ref 8–23)
CO2: 25 mmol/L (ref 22–32)
Calcium: 9.1 mg/dL (ref 8.9–10.3)
Chloride: 101 mmol/L (ref 98–111)
Creatinine, Ser: 1.32 mg/dL — ABNORMAL HIGH (ref 0.61–1.24)
GFR, Estimated: 56 mL/min — ABNORMAL LOW (ref >60)
Glucose, Bld: 76 mg/dL (ref 70–99)
Potassium: 4.4 mmol/L (ref 3.5–5.1)
Sodium: 136 mmol/L (ref 135–145)

## 2022-01-08 LAB — CBG MONITORING, ED
Glucose-Capillary: 78 mg/dL (ref 70–99)
Glucose-Capillary: 106 mg/dL — ABNORMAL HIGH (ref 70–99)

## 2022-01-08 LAB — GLUCOSE, CAPILLARY
Glucose-Capillary: 129 mg/dL — ABNORMAL HIGH (ref 70–99)
Glucose-Capillary: 181 mg/dL — ABNORMAL HIGH (ref 70–99)

## 2022-01-08 MED ORDER — LACTATED RINGERS IV SOLN: INTRAVENOUS | Status: DC

## 2022-01-08 MED ORDER — ENSURE ENLIVE PO LIQD
237.0000 mL | Freq: Three times a day (TID) | ORAL | Status: DC
  Administered 2022-01-09 – 2022-01-12 (×6): 237 mL via ORAL

## 2022-01-08 MED ORDER — ADULT MULTIVITAMIN W/MINERALS CH
1.0000 | ORAL_TABLET | Freq: Every day | ORAL | Status: DC
  Administered 2022-01-08 – 2022-01-12 (×5): 1 via ORAL
  Filled 2022-01-08 (×5): qty 1

## 2022-01-08 MED ORDER — FOLIC ACID 1 MG PO TABS
1.0000 mg | ORAL_TABLET | Freq: Every day | ORAL | Status: DC
  Administered 2022-01-09 – 2022-01-12 (×4): 1 mg via ORAL
  Filled 2022-01-08 (×4): qty 1

## 2022-01-08 NOTE — Evaluation (Signed)
Physical Therapy Evaluation ?Patient Details ?Name: Kevin [name] ?MRN: [id number] ?DOB: Oct 30, 1946 ?Today's Date: 01/08/2022 ? ?History of Present Illness ? Patient is a 76 year old male admitted with hypotension and failure to thrive. Family reported patient having poor appetite, significant weight loss, and generalized weakness. 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 The patient   presents with weakness and requires min guard/ steady assistance for ambulation  using RW in the room. No family present. Patient  does require assistance if Dc home.Pt admitted with above diagnosis.  Pt currently with functional limitations due to the deficits listed below (see PT Problem List). Pt will benefit from skilled PT to increase their independence and safety with mobility to allow discharge to the venue listed below.   ? ?   ? ?Recommendations for follow up therapy are one component of a multi-disciplinary discharge planning process, led by the attending physician.  Recommendations may be updated based on patient status, additional functional criteria and insurance authorization. ? ?Follow Up Recommendations Home health PT(if family can provide current assistance 24/7) vs SNf  ? ?  ?Assistance Recommended at Discharge Frequent or constant Supervision/Assistance  ?Patient can return home with the following ? A little help with walking and/or transfers;A little help with bathing/dressing/bathroom;Help with stairs or ramp for entrance;Assistance with cooking/housework;Assist for transportation ? ?  ?Equipment Recommendations  (TBD)  ?Recommendations for Other Services ?    ?  ?Functional Status Assessment Patient has had a recent decline in their functional status and  demonstrates the ability to make significant improvements in function in a reasonable and predictable amount of time.  ? ?  ?Precautions / Restrictions Precautions ?Precautions: Fall ?Restrictions ?Weight Bearing Restrictions: No  ? ?  ? ?Mobility ? Bed Mobility ?  ?  ?  ?  ?  ?  ?  ?  ?  ? ?Transfers ?Overall transfer level: Needs assistance ?Equipment used: Rolling walker (2 wheels) ?Transfers: Sit to/from Stand ?Sit to Stand: Min assist ?  ?  ?  ?  ?  ?General transfer comment: steady assist upon standing, reported dizziness ?  ? ?Ambulation/Gait ?Ambulation/Gait assistance: Min assist ?Gait Distance (Feet): 30 Feet ?Assistive device: Rolling walker (2 wheels) ?Gait Pattern/deviations: Step-through pattern, Drifts right/left ?Gait velocity: decr ?  ?  ?General Gait Details: steady assistance for balance, especially turning and backing up ? ?Stairs ?  ?  ?  ?  ?  ? ?Wheelchair Mobility ?  ? ?Modified Rankin (Stroke Patients Only) ?  ? ?  ? ?Balance Overall balance assessment: History of Falls, Needs assistance ?Sitting-balance support: Feet supported ?Sitting balance-Leahy Scale: Fair ?  ?  ?Standing balance support: Reliant on assistive device for balance, Bilateral upper extremity supported, During functional activity ?Standing balance-Leahy Scale: Poor ?  ?  ?  ?  ?  ?  ?  ?  ?  ?  ?  ?  ?   ? ? ? ?Pertinent Vitals/Pain Pain Assessment ?Pain Assessment: No/denies pain  ? ? ?Home Living Family/patient expects to be discharged to:: Private residence ?Living Arrangements: Spouse/significant other ?Available Help at Discharge: Family;Available PRN/intermittently ?Type of Home: House ?Home Access: Stairs to enter ?Entrance Stairs-Rails: Right;Left;Can reach both ?Entrance Stairs-Number of Steps: 2 ?  ?Home Layout: One level ?Home Equipment: Conservation officer, nature (2 wheels);Shower seat;Grab bars -  tub/shower;Rollator (4 wheels) ?Additional Comments: Information patient provided matches home set up info from prior admission   ?  ?Prior Function Prior Level of Function : Needs assist;History of Falls (last six months) ?  ?  ?  ?  ?  ?  ?Mobility Comments: Patient using a rolling walker for ambulation ?ADLs Comments: Per prior hospitalization patient needing assistance with LB ADLs, patient states at home he can dress himself. ?  ? ? ?Hand Dominance  ? Dominant Hand: Right ? ?  ?Extremity/Trunk Assessment  ? Upper Extremity Assessment ?Upper Extremity Assessment: Overall WFL for tasks assessed ?  ? ?Lower Extremity Assessment ?Lower Extremity Assessment: Generalized weakness ?  ? ?Cervical / Trunk Assessment ?Cervical / Trunk Assessment: Kyphotic  ?Communication  ? Communication: No difficulties  ?Cognition   ?  ?  ?  ?  ?  ?  ?  ?  ?  ?  ?  ?  ?  ?  ?  ?  ?  ?  ?General Comments: Patient very pleasant and cooperative. Difficulty stating month, oriented to year and place. Reports that he volunteers at [facility] and last did so 1 month ago. ?  ?  ? ?  ?General Comments   ? ?  ?Exercises    ? ?Assessment/Plan  ?  ?PT Assessment Patient needs continued PT services  ?PT Problem List Decreased strength;Decreased mobility;Decreased safety awareness;Decreased knowledge of precautions;Decreased activity tolerance;Decreased cognition;Decreased balance;Decreased knowledge of use of DME ? ?   ?  ?PT Treatment Interventions DME instruction;Therapeutic activities;Gait training;Cognitive remediation;Therapeutic exercise;Patient/family education;Functional mobility training;Balance training   ? ?PT Goals (Current goals can be found in the Care Plan section)  ?Acute Rehab PT Goals ?Patient Stated Goal: I want to get out of here, eat something ?PT Goal Formulation: With patient ?Time For Goal Achievement: 01/22/22 ?Potential to Achieve Goals: Fair ? ?  ?Frequency Min 3X/week ?  ? ? ?Co-evaluation   ?  ?  ?  ?  ? ? ?  ?AM-PAC PT "6 Clicks" Mobility  ?Outcome Measure Help needed turning from your back to your side while in a flat bed without using bedrails?: A  Little ?Help needed moving from lying on your back to sitting on the side of a flat bed without using bedrails?: A Little ?Help needed moving to and from a bed to a chair (including a wheelchair)?: A Little ?Help needed standing up from a chair using your arms (e.g., wheelchair or bedside chair)?: A Little ?Help needed to walk in hospital room?: A Little ?Help needed climbing 3-5 steps with a railing? : A Lot ?6 Click Score: 17 ? ?  ?End of Session Equipment Utilized During Treatment: Gait belt ?Activity Tolerance: Patient limited by fatigue;Patient tolerated treatment well ?Patient left: in bed;with call bell/phone within reach;with nursing/sitter in room ?Nurse Communication: Mobility status ?PT Visit Diagnosis: Unsteadiness on feet (R26.81);History of falling (Z91.81) ?  ? ?Time: 0867-6195 ?PT Time Calculation (min) (ACUTE ONLY): 15 min ? ? ?Charges:   PT Evaluation ?$PT Eval Low Complexity: 1 Low ?  ?  ?   ? ? ?Tresa Endo PT ?Acute Rehabilitation Services ?Pager [phone] ?Office [phone] ? ? ?Chandon Lazcano, Shella Maxim ?01/08/2022, 2:21 PM ? ?

## 2022-01-08 NOTE — Progress Notes (Signed)
Initial Nutrition Assessment ? ?DOCUMENTATION CODES:  ?Underweight ? ?INTERVENTION:  ?Liberalize to regular diet, HgbA1c 5.3% and underweight ?Ensure Enlive po TID, each supplement provides 350 kcal and 20 grams of protein. ?Mighty Shake TID to provide 330 kcal and 9g of protein ?MVI with minerals daily ? ?NUTRITION DIAGNOSIS:  ?Inadequate oral intake related to poor appetite as evidenced by per patient/family report (underweight BMI). ? ?GOAL:  ?Patient will meet greater than or equal to 90% of their needs ? ?MONITOR:  ?PO intake, Supplement acceptance ? ?REASON FOR ASSESSMENT:  ?Consult ?Assessment of nutrition requirement/status ? ?ASSESSMENT:  ?76 year old male with history of dementia, HTN, HLD, DM type 2, CHF, CKD3a, and history of prostate cancer brought to ED by family for FTT at home. Family requests assistance with SNF placement.   ? ?Pt recently hospitalized in December after a fall resulting in pelvic fracture and hematoma. Discharged to CIR and was followed by RD at that time.  ? ?Attempted to call pt on room phone. Unable to reach pt at this time. Called unit, RN and NT unavailable at this time. Reviewed weight and based on records, pt has lost ~35 lbs in 3 months and BMI is extremely low. It appears current weight is copied over from a stated weight at a virtual visit. Unsure of accuracy. Last true measured weight was in January, request new.  ? ?Wife reported extreme weakness on admission, reviewed therapy notes and current recommendation is for home with home health. ? ?Will follow-up on weight, NFPE and nutrition interview next week. In the meantime, will initiate nutrition supplements and vitamin regimen. Noted that pt was dx with malnutrition during CIR admission.  ? ?Nutritionally Relevant Medications: ?Scheduled Meds: ? insulin aspart  0-5 Units Subcutaneous QHS  ? insulin aspart  0-9 Units Subcutaneous TID WC  ? ?Continuous Infusions: ? lactated ringers 75 mL/hr at 01/08/22 1517  ? ?Labs  Reviewed: ?BUN 38, creatinine 1.32 ?Mg 2.6  ?SBG ranges from 78-129 mg/dL over the last 24 hours ?HgbA1c 5.3% ? ?NUTRITION - FOCUSED PHYSICAL EXAM: ?Defer to in-person assessment ? ?Diet Order:   ?Diet Order   ? ?       ?  Diet Carb Modified Fluid consistency: Thin; Room service appropriate? Yes  Diet effective now       ?  ? ?  ?  ? ?  ? ? ?EDUCATION NEEDS:  ?No education needs have been identified at this time ? ?Skin:  Skin Assessment: Reviewed RN Assessment ? ?Last BM:  unknown ? ?Height:  ?Ht Readings from Last 1 Encounters:  ?01/07/22 6\' 4"  (1.93 m)  ? ? ?Weight:  ?Wt Readings from Last 1 Encounters:  ?01/07/22 51.7 kg  ? ? ?Ideal Body Weight:  91.8 kg ? ?BMI:  Body mass index is 13.87 kg/m?. ? ?Estimated Nutritional Needs:  ?Kcal:  1800-2000 kcal/d ?Protein:  90-100 g/d ?Fluid:  2L/d ? ? ?Ranell Patrick, RD, LDN ?Clinical Dietitian ?RD pager # available in [place]  ?After hours/weekend pager # available in [place] ?

## 2022-01-08 NOTE — Telephone Encounter (Signed)
Noted  

## 2022-01-08 NOTE — Evaluation (Signed)
Occupational Therapy Evaluation ?Patient Details ?Name: Kevin [name] ?MRN: [id number] ?DOB: 1946-11-06 ?Today's Date: 01/08/2022 ? ? ?History of Present Illness Patient is a 76 year old male admitted with hypotension and failure to thrive. Family reported patient having poor appetite, significant weight loss, and generalized weakness. 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 ?Clinical Impression ?  ?Patient is from home with spouse. Per patient he was ambulatory with walker and able to perform self care tasks. Per chart review patient needing assist with lower body ADLs at baseline. Patient with hx dementia, oriented to self, place and year incorrect month. Overall patient was min G assist for bed mobility and ambulation in room with walker for safety. Patient needing total A to don socks due to fearful of falling backwards while edge of bed. At present time would recommend home health services at D/C if family can provide current assist levels and increased supervision at home (patient states he has family and neighbors that check in often and spouse works 2 miles down the road). If family feels they cannot provide current assist levels may benefit from short term rehab to maximize patient's functional independence. Acute OT to follow.  ?   ? ?Recommendations for follow up therapy are one component of a multi-disciplinary discharge planning process, led by the attending physician.  Recommendations may be updated based on patient status, additional functional criteria and insurance authorization.  ? ?Follow Up Recommendations ? Home health OT (if family can provide current assist levels)  ?  ?Assistance Recommended at Discharge Frequent or constant Supervision/Assistance  ?Patient  can return home with the following A little help with walking and/or transfers;A little help with bathing/dressing/bathroom;Assistance with cooking/housework;Direct supervision/assist for medications management;Direct supervision/assist for financial management;Help with stairs or ramp for entrance;Assist for transportation ? ?  ?Functional Status Assessment ? Patient has had a recent decline in their functional status and demonstrates the ability to make significant improvements in function in a reasonable and predictable amount of time.  ?Equipment Recommendations ? None recommended by OT  ?  ?   ?Precautions / Restrictions Precautions ?Precautions: Fall ?Restrictions ?Weight Bearing Restrictions: No  ? ?  ? ?Mobility Bed Mobility ?Overal bed mobility: Needs Assistance ?Bed Mobility: Supine to Sit, Sit to Supine ?  ?  ?Supine to sit: Min guard ?Sit to supine: Min guard ?  ?General bed mobility comments: min G for safety ?  ? ? ? ?  ?Balance Overall balance assessment: History of Falls, Needs assistance ?Sitting-balance support: Feet supported ?Sitting balance-Leahy Scale: Fair ?  ?  ?Standing balance support: Reliant on assistive device for balance ?Standing balance-Leahy Scale: Poor ?  ?  ?  ?  ?  ?  ?  ?  ?  ?  ?  ?  ?   ? ?ADL either performed or assessed with clinical judgement  ? ?ADL Overall ADL's : Needs assistance/impaired ?Eating/Feeding: Independent ?  ?Grooming: Set up;Sitting ?  ?Upper Body Bathing: Set up;Sitting ?  ?Lower Body Bathing: Moderate assistance;Sitting/lateral leans;Sit to/from stand ?  ?Upper Body Dressing : Set up;Sitting ?  ?Lower Body Dressing: Total assistance;Sitting/lateral leans ?Lower Body Dressing Details (indicate cue type and reason): Patient attempts to don socks but is fearful of falling backwards needing total A. Per chart review patient needing assist with LB ADLs at baseline however  patient stating he can normally do this at home ?Toilet Transfer: Min  guard;Ambulation;Rolling walker (2 wheels) ?Toilet Transfer Details (indicate cue type and reason): Patient able to ambulate in room at min G level for safety and rolling walker. ?Toileting- Clothing Manipulation and Hygiene: Minimal assistance;Sit to/from stand;Sitting/lateral lean ?  ?  ?  ?Functional mobility during ADLs: Min guard;Rolling walker (2 wheels) ?   ? ? ? ? ?Pertinent Vitals/Pain Pain Assessment ?Pain Assessment: No/denies pain  ? ? ? ?Hand Dominance Right ?  ?Extremity/Trunk Assessment Upper Extremity Assessment ?Upper Extremity Assessment: Overall WFL for tasks assessed ?  ?Lower Extremity Assessment ?Lower Extremity Assessment: Defer to PT evaluation ?  ?  ?  ?Communication Communication ?Communication: No difficulties ?  ?Cognition Arousal/Alertness: Awake/alert ?Behavior During Therapy: [facility] for tasks assessed/performed ?Overall Cognitive Status: History of cognitive impairments - at baseline ?  ?  ?  ?  ?  ?  ?  ?  ?  ?  ?  ?  ?  ?  ?  ?  ?General Comments: Patient very pleasant and cooperative. Difficulty stating month, oriented to year and place. ?  ?  ?   ?   ?   ? ? ?Home Living Family/patient expects to be discharged to:: Private residence ?Living Arrangements: Spouse/significant other ?Available Help at Discharge: Family;Available PRN/intermittently ?Type of Home: House ?Home Access: Stairs to enter ?Entrance Stairs-Number of Steps: 2 ?Entrance Stairs-Rails: Right;Left;Can reach both ?Home Layout: One level ?  ?  ?Bathroom Shower/Tub: Walk-in shower ?  ?Bathroom Toilet: Handicapped height ?  ?  ?Home Equipment: Conservation officer, nature (2 wheels);Shower seat;Grab bars - tub/shower;Rollator (4 wheels) ?  ?Additional Comments: Information patient provided matches home set up info from prior admission ?  ? ?  ?Prior Functioning/Environment Prior Level of Function : Needs assist;History of Falls (last six months) ?  ?  ?  ?  ?  ?  ?Mobility Comments: Patient using a rolling walker for ambulation ?ADLs  Comments: Per prior hospitalization patient needing assistance with LB ADLs, patient states at home he can dress himself. ?  ? ?  ?  ?OT Problem List: Decreased activity tolerance;Impaired balance (sitting and/or standing) ?  ?   ?OT Treatment/Interventions: Self-care/ADL training;Balance training;Patient/family education;Therapeutic activities;DME and/or AE instruction  ?  ?OT Goals(Current goals can be found in the care plan section) Acute Rehab OT Goals ?Patient Stated Goal: Home ?OT Goal Formulation: With patient ?Time For Goal Achievement: 01/22/22 ?Potential to Achieve Goals: Good  ?OT Frequency: Min 2X/week ?  ? ?   ?AM-PAC OT "6 Clicks" Daily Activity     ?Outcome Measure Help from another person eating meals?: None ?Help from another person taking care of personal grooming?: A Little ?Help from another person toileting, which includes using toliet, bedpan, or urinal?: A Little ?Help from another person bathing (including washing, rinsing, drying)?: A Lot ?Help from another person to put on and taking off regular upper body clothing?: A Little ?Help from another person to put on and taking off regular lower body clothing?: Total ?6 Click Score: 16 ?  ?End of Session Equipment Utilized During Treatment: Rolling walker (2 wheels) ?Nurse Communication: Mobility status ? ?Activity Tolerance: Patient tolerated treatment well ?Patient left: in bed;with call bell/phone within reach ? ?OT Visit Diagnosis: Other abnormalities of gait and mobility (R26.89)  ?              ?Time: [phone] ?OT Time Calculation (min): 16 min ?Charges:  OT General Charges ?$OT Visit:  1 Visit ?OT Evaluation ?$OT Eval Low Complexity: 1 Low ? ?Delbert Phenix OT ?OT pager: [phone] ? ? ?Rosemary Holms ?01/08/2022, 11:15 AM ?

## 2022-01-08 NOTE — Progress Notes (Addendum)
?PROGRESS NOTE ? ? ? ?Kevin [name]  MEQ:[id number] DOB: 12/29/1945 DOA: 01/07/2022 ?PCP: [name], MD  ? ? ? ?Brief Narrative:  ?No notes on file ? ?DM2, HLD, nonischemic cardiomyopathy, second-degree heart block s/p PPM, prostate cancer s/p XRT on Proscar, dementia ?Brought to the ED from home by EMS, Patient has been steadily declining over the past few weeks. Patient has not been eating/drinking and has had a 30 lbs weight loss in 3 weeks. Patient was hypotensive upon EMS arrival. Patient c/o dizziness and weakness, --BP 74/62 per EMS ? ?Subjective: ? ?He is demented ,appear weak, but calm and cooperative ?He ambulated a few steps with a walker and assistant by PT ? ?Assessment & Plan: ? Principal Problem: ?  Failure to thrive in adult ?Active Problems: ?  Acute renal failure superimposed on stage 3a chronic kidney disease ([place]) ?  High anion gap metabolic acidosis ?  Lactic acidosis ?  Chronic combined systolic and diastolic heart failure ([place]) ?  Hypertension ?  DM2 (diabetes mellitus, type 2) ([place]) ?  Hyperlipidemia ?  Depression ?  Dementia ([place]) ?  FTT (failure to thrive) in adult ? ? ? ?Assessment and Plan: ? ?Dehydration/AKI on CKD 2 ? UA with bacteria, currently there is no fever, no leukocytosis, will hold off antibiotic, awaiting urine culture ?Continue IV hydration ?Repeat BMP in the morning ?Renal dosing meds ? bladder scan, per chart review there is history of intermittently needing Foley catheter ?Currently no visible hematuria ? ?Lactic acidosis, mild, resolved after hydration ? ?Elevated anion gap At 27 on presentation ?Normalized after hydration ? ?Microcytosis/Mild thrombocytopenia ?MCV 101.8 , platelet 125 ?check W97, start folic acid ?Monitor CBC ? ?Chronic combined heart failure/nonischemic cardiomyopathy/pacemaker ?Currently appear dehydrated ?Continue gentle hydration ?Monitor volume status ? ?H/o Diet-controlled diabetes ?With poor oral intake, weight loss ?A1c 5.3 ?SSI  ordered on admission, has not needed any ? ?History of prostate cancer ?Continue Proscar ? ?Failure to thrive/decreased oral intake/significant weight loss frequent falls ?Appreciate palliative care input ?PT eval ? ?Underweight: Body mass index is 14.24 kg/m?Marland KitchenMarland Kitchen ?Seen by dietician.  I agree with the assessment and plan as outlined below: ?Nutrition Status: ?Nutrition Problem: Inadequate oral intake ?Etiology: poor appetite ?Signs/Symptoms: per patient/family report (underweight BMI) ?Interventions: Refer to RD note for recommendations ? ?. ? ? ?I have Reviewed nursing notes, Vitals, pain scores, I/o's, Lab results and  imaging results since pt's last encounter, details please see discussion above  ?I ordered the following labs:  ?Unresulted Labs (From admission, onward)  ? ?  Start     Ordered  ? 01/09/22 0500  CBC with Differential/Platelet  Tomorrow morning,   R       ? 01/08/22 1723  ? 01/09/22 9892  Basic metabolic panel  Tomorrow morning,   R       ? 01/08/22 1723  ? 01/09/22 0500  Vitamin B12  Tomorrow morning,   R       ? 01/08/22 1723  ? 01/08/22 1439  Urine Culture  Once,   R       ?Question:  Indication  Answer:  Altered mental status (if no other cause identified)  ? 01/08/22 1438  ? ?  ?  ? ?  ? ? ? ?DVT prophylaxis: SCDs Start: 01/07/22 2054 ? ? ?Code Status:   Code Status: Full Code ? ?Family Communication: Wife over the phone ?Disposition:  ? ?Status is: Inpatient ? ?Dispo: The patient is from: Home ?  Anticipated d/c is to: TBD, likely will need SNF, wife stated patient does not have 24/7 care at home, she prefers patient to go to skilled nursing facility ?             Anticipated d/c date is: Early next week ? ?Antimicrobials:   ? ?Anti-infectives (From admission, onward)  ? ? None  ? ?  ? ? ? ? ? ?Objective: ?Vitals:  ? 01/08/22 0753 01/08/22 1138 01/08/22 1244 01/08/22 1630  ?BP: 107/62 93/71 105/67 100/70  ?Pulse: 73 78 74 83  ?Resp: 15 (!) 21 16 16   ?Temp:  97.6 ?F (36.4 ?C) 97.6 ?F  (36.4 ?C) 97.6 ?F (36.4 ?C)  ?TempSrc:   Oral Oral  ?SpO2: 98% 99% 100% 100%  ?Weight:    53.1 kg  ?Height:      ? ? ?Intake/Output Summary (Last 24 hours) at 01/08/2022 1726 ?Last data filed at 01/08/2022 1640 ?Gross per 24 hour  ?Intake 240 ml  ?Output --  ?Net 240 ml  ? ?Filed Weights  ? 01/07/22 1637 01/08/22 1630  ?Weight: 51.7 kg 53.1 kg  ? ? ?Examination: ? ?General exam: Frail, weak, thin, demented but cooperative ?Respiratory system: Clear to auscultation. Respiratory effort normal. ?Cardiovascular system:  RRR.  ?Gastrointestinal system: Abdomen is nondistended, soft and nontender.  Normal bowel sounds heard. ?Central nervous system: Alert and interactive. No focal neurological deficits. ?Extremities:  no edema ?Skin: No rashes, lesions or ulcers ?Psychiatry: Demented, but cooperative, no agitation.  ? ? ? ?Data Reviewed: I have personally reviewed  labs and visualized  imaging studies since the last encounter and formulate the plan  ? ? ? ? ? ? ?Scheduled Meds: ? feeding supplement  237 mL Oral TID BM  ? insulin aspart  0-5 Units Subcutaneous QHS  ? insulin aspart  0-9 Units Subcutaneous TID WC  ? multivitamin with minerals  1 tablet Oral Daily  ? ?Continuous Infusions: ? lactated ringers 75 mL/hr at 01/08/22 1517  ? ? ? LOS: 0 days  ? ? ? ? ?Florencia Reasons, MD PhD FACP ?[facility] ? ?Available via Epic secure chat 7am-7pm for nonurgent issues ?Please page for urgent issues ?To page the attending provider between 7A-7P or the covering provider during after hours 7P-7A, please log into the web site [email or web address] and access using universal [place] password for that web site. If you do not have the password, please call the hospital operator. ? ? ? ?01/08/2022, 5:26 PM  ? ? ?

## 2022-01-08 NOTE — Consult Note (Addendum)
? ?                                                                                ?Consultation Note ?Date: 01/08/2022  ? ?Patient Name: Kevin [name]  ?DOB: 01-04-1946  MRN: [id number]  Age / Sex: 76 y.o., male  ?PCP: [name], MD ?Referring Physician: Florencia Reasons, MD ? ?Reason for Consultation: Establishing goals of care ? ?HPI/Patient Profile: 76 y.o. male  with past medical history of dementia, combined chronic CHF, second-degree AV block s/p pacemaker, HTN, HLD, diabetes, CKD stage 3a, prostate cancer. Hospitalized 10/20/21-10/24/22 due to fall and pubic fractures pelvic hematoma going to CIR 10/24/22 for 10 days. Hospitalized 11/05/22-11/10/21 for fall with SDH then went to CIR for another 10 days. Admitted on 01/07/2022 from home with hypotension and failure to thrive with acute on chronic kidney disease likely due to dehydration.  ? ?Clinical Assessment and Goals of Care: ?I met today at Kevin [name]'s bedside after reviewing records including past hospitalization and rehab stays, diagnostics. Discussed with RN and NT who reports that he ate most of his breakfast and fed himself well. They noted that he had sundowning and increased confusion/agitation yesterday evening and ripped out two IVs. His appropriateness and confusion fluctuate.  ? ?I spoke with Kevin [name] who is awake and very pleasant. He does not have good recall of his health situation and history. He is able to tell me that his wife is a Pharmacist, hospital and that she does not like for him to get up and move around too much when she is working and he is at home. He tells me that he was in the [employer] and served 19 months in [place] but was exposed to agent orange. He is fully [place] service connected. 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 He also expresses that he is hungry and that he has received no food (although nursing reports he ate a good breakfast). He reports he does not eat  much at home as he does not care for the food options he has available to him at home (he says that he grew up on a farm and likes hot home cooked meals). Plans for transition to regular room shortly.  ? ?I spoke with Kevin [name] about his health. He is unable to tell me why he came to the hospital or any of his health challenges. I reviewed our concerns for dehydration, poor intake, weight loss, and weakness. I discussed with him his wishes regarding resuscitation if his heart and lungs were to fail. He shares that he would want everything done if there is hope but not "if I will be an invalid." His expectations seem to align more with DNR but I will need to discuss further with his wife. He gives me permission to call and speak with his wife, Kevin [name], and I left her a voicemail. Will await return call.  ? ?All questions/concerns addressed. Emotional support provided.  ? ?UPDATE: Received late voicemail from Kevin [name] who plans to be present in the hospital tomorrow 01/09/22 so I will meet and speak with her in person  tomorrow.  ? ?Primary Decision Maker ?NEXT OF KIN Patient can contribute to his goals of care but his wife will be needed to support decisions do to his dementia and poor understanding of consequences of decisions.  ?  ? ?SUMMARY OF RECOMMENDATIONS   ?- Recommend DNR based on my conversation with patient today. Will discuss with his wife tomorrow along with further goals of care.  ? ?Code Status/Advance Care Planning: ?Full code ? ? ?Symptom Management:  ?Per attending.  ? ?Palliative Prophylaxis:  ?Bowel Regimen, Delirium Protocol, Frequent Pain Assessment, and Turn Reposition ? ?Prognosis:  ?Overall prognosis poor with failure to thrive.  ? ?Discharge Planning: To Be Determined  ? ?  ? ?Primary Diagnoses: ?Present on Admission: ? Hypertension ? Hyperlipidemia ? Chronic combined systolic and diastolic heart failure ([place]) ? Failure to thrive in adult ? High anion gap metabolic acidosis ? Lactic acidosis ? ? ?I  have reviewed the medical record, interviewed the patient and family, and examined the patient. The following aspects are pertinent. ? ?Past Medical History:  ?Diagnosis Date  ? Agent orange exposure   ? in [place]  ? Anemia   ? Dementia ([place])   ? Heart block AV second degree   ? a. s/p PPM implant with subsequent CRTD upgrade  ? High cholesterol   ? History of colon polyps   ? LBBB (left bundle branch block)   ? Nonischemic cardiomyopathy ([place])   ? a. MDT CRTD upgrade 2016  ? Pacemaker 08/27/2013  ? Dual-chamber Medtronic Adapta implanted January 2013 for bradycardia with alternating bundle branch block and 2:1 atrioventricular block   ? Prostate cancer ([place])   ? "not been treated yet; on a wait and see" (01/22/2015)  ? Type II diabetes mellitus ([place])   ? ?Social History  ? ?Socioeconomic History  ? Marital status: Married  ?  Spouse name: Not on file  ? Number of children: 0  ? Years of education: Not on file  ? Highest education level: Not on file  ?Occupational History  ? Occupation: Runner, broadcasting/film/video  ?  Employer: [employer]  ? Occupation: Retired  ?Tobacco Use  ? Smoking status: Former  ?  Packs/day: 1.00  ?  Years: 20.00  ?  Pack years: 20.00  ?  Types: Cigarettes  ?  Quit date: 04/13/1984  ?  Years since quitting: 37.7  ? Smokeless tobacco: Never  ?Vaping Use  ? Vaping Use: Never used  ?Substance and Sexual Activity  ? Alcohol use: Yes  ?  Comment: 01/22/2015 "maybe 3 beers or glasses of wine /month"  ? Drug use: No  ? Sexual activity: Yes  ?  Partners: Female  ?Other Topics Concern  ? Not on file  ?Social History Narrative  ? Pt lives in [place] with spouse in 2 story home. He has three step children. 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 [place] Veteran. 14 years of education. Volunteer at [employer] 3 days a week. Right handed    ? ?Social Determinants of Health  ? ?Financial Resource Strain: Not on file  ?Food Insecurity: Not on file  ?Transportation Needs: Not on file   ?Physical Activity: Not on file  ?Stress: Not on file  ?Social Connections: Not on file  ? ?Family History  ?Problem Relation Age of Onset  ? Ovarian cancer Mother   ? Cancer Mother   ?     ?uterine  ? Leukemia Father   ? Heart disease Father   ?  Diabetes Neg Hx   ? ?Scheduled Meds: ? insulin aspart  0-5 Units Subcutaneous QHS  ? insulin aspart  0-9 Units Subcutaneous TID WC  ? ?Continuous Infusions: ?PRN Meds:.acetaminophen **OR** acetaminophen ?Allergies  ?Allergen Reactions  ? Sulfa Antibiotics Swelling  ?  Swelling of lips only.  ? 5-Alpha Reductase Inhibitors   ?  Patient and wife state OK for patient to take finasteride - has not taken it before, NKA  ? ?Review of Systems  ?Constitutional:  Positive for activity change and appetite change.  ?Musculoskeletal:  Positive for back pain.  ?Neurological:  Positive for weakness.  ? ?Physical Exam ?Vitals and nursing note reviewed.  ?Constitutional:   ?   Appearance: He is cachectic. He is ill-appearing.  ?Cardiovascular:  ?   Rate and Rhythm: Normal rate.  ?Pulmonary:  ?   Effort: No tachypnea, accessory muscle usage or respiratory distress.  ?Abdominal:  ?   General: Abdomen is flat.  ?Neurological:  ?   Mental Status: He is alert. He is confused.  ? ? ?Vital Signs: BP 107/62   Pulse 73   Temp 97.6 ?F (36.4 ?C) (Axillary)   Resp 15   Ht 6' 4"  (1.93 m)   Wt 51.7 kg   SpO2 98%   BMI 13.87 kg/m?  ?  ?  ?  ? ? ?SpO2: SpO2: 98 % ?O2 Device:SpO2: 98 % ?O2 Flow Rate: .  ? ?IO: Intake/output summary: No intake or output data in the 24 hours ending 01/08/22 1036 ? ?LBM:   ?Baseline Weight: Weight: 51.7 kg ?Most recent weight: Weight: 51.7 kg     ?Palliative Assessment/Data: ? ? ? ? ?Time Total: 60 min ? ?Greater than 50%  of this time was spent counseling and coordinating care related to the above assessment and plan. ? ?Signed by: ?Vinie Sill, NP ?Palliative Medicine Team ?Pager # [phone] (M-F 8a-5p) ?Team Phone # [phone] (Nights/Weekends) ?   ? ? ? ? ? ? ? ? ? ? ? ? ?  ?

## 2022-01-09 DIAGNOSIS — Z7189 Other specified counseling: Secondary | ICD-10-CM

## 2022-01-09 DIAGNOSIS — Z515 Encounter for palliative care: Secondary | ICD-10-CM

## 2022-01-09 LAB — CBC WITH DIFFERENTIAL/PLATELET
Abs Immature Granulocytes: 0.06 10*3/uL (ref 0.00–0.07)
Basophils Absolute: 0 10*3/uL (ref 0.0–0.1)
Basophils Relative: 0 %
Eosinophils Absolute: 0 10*3/uL (ref 0.0–0.5)
Eosinophils Relative: 1 %
HCT: 41 % (ref 39.0–52.0)
Hemoglobin: 13.4 g/dL (ref 13.0–17.0)
Immature Granulocytes: 1 %
Lymphocytes Relative: 21 %
Lymphs Abs: 1 10*3/uL (ref 0.7–4.0)
MCH: 32.4 pg (ref 26.0–34.0)
MCHC: 32.7 g/dL (ref 30.0–36.0)
MCV: 99.3 fL (ref 80.0–100.0)
Monocytes Absolute: 0.3 10*3/uL (ref 0.1–1.0)
Monocytes Relative: 7 %
Neutro Abs: 3.3 10*3/uL (ref 1.7–7.7)
Neutrophils Relative %: 70 %
Platelets: 99 10*3/uL — ABNORMAL LOW (ref 150–400)
RBC: 4.13 MIL/uL — ABNORMAL LOW (ref 4.22–5.81)
RDW: 13 % (ref 11.5–15.5)
WBC: 4.7 10*3/uL (ref 4.0–10.5)
nRBC: 0 % (ref 0.0–0.2)

## 2022-01-09 LAB — GLUCOSE, CAPILLARY
Glucose-Capillary: 99 mg/dL (ref 70–99)
Glucose-Capillary: 181 mg/dL — ABNORMAL HIGH (ref 70–99)
Glucose-Capillary: 123 mg/dL — ABNORMAL HIGH (ref 70–99)
Glucose-Capillary: 169 mg/dL — ABNORMAL HIGH (ref 70–99)

## 2022-01-09 LAB — VITAMIN B12: Vitamin B-12: 981 pg/mL — ABNORMAL HIGH (ref 180–914)

## 2022-01-09 LAB — BASIC METABOLIC PANEL
Anion gap: 6 (ref 5–15)
BUN: 27 mg/dL — ABNORMAL HIGH (ref 8–23)
CO2: 25 mmol/L (ref 22–32)
Calcium: 8.7 mg/dL — ABNORMAL LOW (ref 8.9–10.3)
Chloride: 103 mmol/L (ref 98–111)
Creatinine, Ser: 1.13 mg/dL (ref 0.61–1.24)
GFR, Estimated: >60 mL/min (ref >60)
Glucose, Bld: 133 mg/dL — ABNORMAL HIGH (ref 70–99)
Potassium: 3.3 mmol/L — ABNORMAL LOW (ref 3.5–5.1)
Sodium: 134 mmol/L — ABNORMAL LOW (ref 135–145)

## 2022-01-09 MED ORDER — ATORVASTATIN CALCIUM 40 MG PO TABS
40.0000 mg | ORAL_TABLET | Freq: Every evening | ORAL | Status: DC
  Administered 2022-01-09 – 2022-01-11 (×3): 40 mg via ORAL
  Filled 2022-01-09 (×3): qty 1

## 2022-01-09 MED ORDER — MEMANTINE HCL 10 MG PO TABS
10.0000 mg | ORAL_TABLET | Freq: Two times a day (BID) | ORAL | Status: DC
  Administered 2022-01-09 – 2022-01-12 (×7): 10 mg via ORAL
  Filled 2022-01-09 (×7): qty 1

## 2022-01-09 MED ORDER — MIRTAZAPINE 15 MG PO TBDP
15.0000 mg | ORAL_TABLET | Freq: Every day | ORAL | Status: DC
  Administered 2022-01-09 – 2022-01-11 (×3): 15 mg via ORAL
  Filled 2022-01-09 (×4): qty 1

## 2022-01-09 MED ORDER — HALOPERIDOL LACTATE 5 MG/ML IJ SOLN
2.0000 mg | Freq: Once | INTRAMUSCULAR | Status: AC
Start: 2022-01-09 — End: 2022-01-09
  Administered 2022-01-09: 2 mg via INTRAVENOUS
  Filled 2022-01-09: qty 1

## 2022-01-09 MED ORDER — DONEPEZIL HCL 10 MG PO TABS
5.0000 mg | ORAL_TABLET | Freq: Every day | ORAL | Status: DC
  Administered 2022-01-09 – 2022-01-11 (×3): 5 mg via ORAL
  Filled 2022-01-09 (×3): qty 1

## 2022-01-09 MED ORDER — VITAMIN D3 25 MCG (1000 UNIT) PO TABS
1000.0000 [IU] | ORAL_TABLET | Freq: Every day | ORAL | Status: DC
  Administered 2022-01-09 – 2022-01-12 (×4): 1000 [IU] via ORAL
  Filled 2022-01-09 (×4): qty 1

## 2022-01-09 MED ORDER — POTASSIUM CHLORIDE 20 MEQ PO PACK
40.0000 meq | PACK | Freq: Once | ORAL | Status: AC
  Administered 2022-01-09: 40 meq via ORAL
  Filled 2022-01-09: qty 2

## 2022-01-09 MED ORDER — BUPROPION HCL ER (XL) 150 MG PO TB24
150.0000 mg | ORAL_TABLET | Freq: Every day | ORAL | Status: DC
  Administered 2022-01-09 – 2022-01-12 (×4): 150 mg via ORAL
  Filled 2022-01-09 (×4): qty 1

## 2022-01-09 NOTE — Progress Notes (Addendum)
?PROGRESS NOTE ? ? ? ?Kevin [name]  VZC:[id number] DOB: 25-Jul-1946 DOA: 01/07/2022 ?PCP: [name], MD  ? ? ? ?Brief Narrative:  ? ? ?DM2, HLD, nonischemic cardiomyopathy, second-degree heart block s/p PPM, prostate cancer s/p XRT on Proscar, dementia ?Brought to the ED from home by EMS, Patient has been steadily declining over the past few weeks. Patient has not been eating/drinking and has had a 30 lbs weight loss in 3 weeks. Patient was hypotensive upon EMS arrival. Patient c/o dizziness and weakness, --BP 74/62 per EMS ? ?Subjective: ? ?Was agitated and very confused around 3-4am,  ?this morning he reports feeling better, he is aaox3 currently, he denies pain ? ?Assessment & Plan: ? Principal Problem: ?  Failure to thrive in adult ?Active Problems: ?  Acute renal failure superimposed on stage 3a chronic kidney disease ([place]) ?  High anion gap metabolic acidosis ?  Lactic acidosis ?  Chronic combined systolic and diastolic heart failure ([place]) ?  Hypertension ?  DM2 (diabetes mellitus, type 2) ([place]) ?  Hyperlipidemia ?  Depression ?  Dementia ([place]) ?  FTT (failure to thrive) in adult ? ? ? ?Assessment and Plan: ? ?Dehydration/AKI on CKD 2 ? UA with bacteria, currently there is no fever, no leukocytosis, will hold off antibiotic, awaiting urine culture ?Cr improving, Continue IV hydration ?Repeat BMP in the morning ?Renal dosing meds ? bladder scan, per chart review there is history of intermittently needing Foley catheter ?Currently no visible hematuria ? ?Hypokalemia ?Replace K ? ?Lactic acidosis, mild, resolved after hydration ? ?Elevated anion gap At 27 on presentation ?Normalized after hydration ? ?Microcytosis/Mild thrombocytopenia ?J28 [id number] ? start folic acid ?Monitor CBC ? ?Chronic combined heart failure/nonischemic cardiomyopathy/status post BiV ICD ?Currently appear dehydrated ?Continue gentle hydration ?Monitor volume status ? ?H/o Diet-controlled diabetes ?With poor oral intake, weight  loss ?A1c 5.3 ?SSI ordered on admission, has not needed any ? ?History of prostate cancer ?Continue Proscar ? ?Dementia ?Continue Aricept and Namenda ? ?Failure to thrive/decreased oral intake/significant weight loss frequent falls ?Appreciate palliative care input ?PT eval ? ?Underweight: Body mass index is 14.24 kg/m?Marland KitchenMarland Kitchen ?Seen by dietician.  I agree with the assessment and plan as outlined below: ?Nutrition Status: ?Nutrition Problem: Inadequate oral intake ?Etiology: poor appetite ?Signs/Symptoms: per patient/family report (underweight BMI) ?Interventions: Refer to RD note for recommendations ? ?. ? ? ?I have Reviewed nursing notes, Vitals, pain scores, I/o's, Lab results and  imaging results since pt's last encounter, details please see discussion above  ?I ordered the following labs:  ?Unresulted Labs (From admission, onward)  ? ?  Start     Ordered  ? 01/10/22 0500  CBC  Daily,   R     ? 01/09/22 0902  ? 01/10/22 7672  Basic metabolic panel  Daily,   R     ? 01/09/22 0902  ? 01/10/22 0500  Magnesium  Tomorrow morning,   STAT       ? 01/09/22 0902  ? 01/08/22 1439  Urine Culture  Once,   R       ?Question:  Indication  Answer:  Altered mental status (if no other cause identified)  ? 01/08/22 1438  ? ?  ?  ? ?  ? ? ? ?DVT prophylaxis: SCDs Start: 01/07/22 2054 ? ? ?Code Status:   Code Status: DNR ? ?Family Communication: Wife over the phone ?Disposition:  ? ?Status is: Inpatient ? ?Dispo: The patient is from: Home ?  Anticipated d/c is to: SNF ?             Anticipated d/c date is: likely will be medically stable to d/c to snf on 3/5 ? ?Antimicrobials:   ? ?Anti-infectives (From admission, onward)  ? ? None  ? ?  ? ? ? ? ? ?Objective: ?Vitals:  ? 01/08/22 1947 01/09/22 0057 01/09/22 0615 01/09/22 1408  ?BP: 112/68 132/74 104/63 95/65  ?Pulse: 84 79 68 75  ?Resp: '18 18 16   '$ ?Temp: 98.7 ?F (37.1 ?C) 98.6 ?F (37 ?C) 98.5 ?F (36.9 ?C) 97.6 ?F (36.4 ?C)  ?TempSrc: Oral Oral Oral Oral  ?SpO2: 100% 99% 99%  96%  ?Weight:      ?Height:      ? ? ?Intake/Output Summary (Last 24 hours) at 01/09/2022 1435 ?Last data filed at 01/09/2022 1025 ?Gross per 24 hour  ?Intake 1488.84 ml  ?Output 800 ml  ?Net 688.84 ml  ? ?Filed Weights  ? 01/07/22 1637 01/08/22 1630  ?Weight: 51.7 kg 53.1 kg  ? ? ?Examination: ? ?General exam: Frail, weak, thin, aaox3 ?Respiratory system: Clear to auscultation. Respiratory effort normal. ?Cardiovascular system:  RRR.  ?Gastrointestinal system: Abdomen is nondistended, soft and nontender.  Normal bowel sounds heard. ?Central nervous system: Alert and interactive. No focal neurological deficits. ?Extremities:  no edema ?Skin: No rashes, lesions or ulcers ?Psychiatry: cooperative, no agitation.  ? ? ? ?Data Reviewed: I have personally reviewed  labs and visualized  imaging studies since the last encounter and formulate the plan  ? ? ? ? ? ? ?Scheduled Meds: ? buPROPion  150 mg Oral Daily  ? donepezil  5 mg Oral QHS  ? feeding supplement  237 mL Oral TID BM  ? folic acid  1 mg Oral Daily  ? insulin aspart  0-5 Units Subcutaneous QHS  ? insulin aspart  0-9 Units Subcutaneous TID WC  ? memantine  10 mg Oral BID  ? mirtazapine  15 mg Oral QHS  ? multivitamin with minerals  1 tablet Oral Daily  ? ?Continuous Infusions: ? lactated ringers 75 mL/hr at 01/09/22 0240  ? ? ? LOS: 1 day  ? ? ? ? ?Florencia Reasons, MD PhD FACP ?[facility] ? ?Available via Epic secure chat 7am-7pm for nonurgent issues ?Please page for urgent issues ?To page the attending provider between 7A-7P or the covering provider during after hours 7P-7A, please log into the web site [email or web address] and access using universal [place] password for that web site. If you do not have the password, please call the hospital operator. ? ? ? ?01/09/2022, 2:35 PM  ? ? ?

## 2022-01-09 NOTE — Progress Notes (Signed)
Pt has been argumentative this shift. Pt yelling at staff. Telling that staff hadn't offered food. Pt had been given [place] sandwich, jello, ensure, graham crackers and peanut butter. Pt wanting to walk independently in room. Pt was assessed at the beginning of the shift and needed 1-2 assist to transfer to bsc. Pt explained regarding fall risk. Pt informed that he has 2 tubes that he could trip over. RN offered to attempt to walk pt in the hall. Pt refused. Pt yelled at staff wanting them to arm wrestle with him. Then stated someone comes in here for me i'm getting a gun and was attempting to go toward staff. Pt continues to be confused and only oriented to self. Provider contacted and haldol given '2mg'$  IV.  ?

## 2022-01-09 NOTE — Progress Notes (Signed)
Palliative: ? ?HPI: 76 y.o. male  with past medical history of dementia, combined chronic CHF, second-degree AV block s/p pacemaker, HTN, HLD, diabetes, CKD stage 3a, prostate cancer. Hospitalized 10/20/21-10/24/22 due to fall and pubic fractures pelvic hematoma going to CIR 10/24/22 for 10 days. Hospitalized 11/05/22-11/10/21 for fall with SDH then went to CIR for another 10 days. Admitted on 01/07/2022 from home with hypotension and failure to thrive with acute on chronic kidney disease likely due to dehydration.  ? ?I met today at Kevin [name]'s bedside along with wife, Kevin [name], as well. He is eating better and continues to do well. Having episodes of agitation at times but pleasant and cooperative during my visit. Able to feed himself after being set up. Kevin [name] has been home (not working) and caring for her husband since January. She expresses hope that he will continue to eat better and gain some improvement in activity and ability to walk. We did discuss overall failure to thrive and how his dementia and recent acute illnesses and injuries have set [place] in a different place. She understands that he is likely to continue to have challenges and decline. At this time she wishes to continue with IV fluids as indicated and therapy to try and see if he can gain improvement.  ? ?We did discuss the possibility that we can help him improve now but if we are again in the same situation in a week or two we will need to have further discussions and consider other options for care. Kevin [name] expresses understanding. I also discussed with them code status and Kevin [name] is consistent with his wishes that he would not want to be left in an "invalid" state. We discussed that this would be a very likely scenario if he were to worsen to need resuscitative efforts. They both agree that DNR is aligned with his wishes. We reviewed medications to ensure that he continues his Wellbutrin (Kevin [name] reports he takes 150 mg daily only), mirtazapine,  Aricept, Namenda. They have an appointment with outpatient palliative care Tuesday 01/12/22. We also discussed pursuing more assistance at home from [place] as he is fully [place] service connected with disability from exposure to agent orange in [place]. She would like to ultimately help him to remain at home if possible but needs more help (likely open to rehab options as well).  ? ?All questions/concerns addressed. Emotional support provided.  ? ?Exam: Alert, mostly oriented but forgetful and poor historian. No distress. Thin, cachectic. Breathing regular, unlabored. Abd flat. Moves all extremities. Generalized weakness but able to turn himself and move self in bed.  ? ?Plan: ?- DNR decided.  ?- Hopeful for improvement in intake and functional ability. Understands that this may not be attainable goal but hoping to try.  ?- Goals are clear. Outpatient palliative to follow. Please call [phone] for any further acute palliative needs.  ? ?55 min ? ?Kevin Sill, NP ?Palliative Medicine Team ?Pager [phone] (Please see [email or web address] for schedule) ?Team Phone [phone]  ? ? ?Greater than 50%  of this time was spent counseling and coordinating care related to the above assessment and plan   ?

## 2022-01-09 NOTE — Plan of Care (Signed)
?  Problem: Coping: ?Goal: Level of anxiety will decrease ?Outcome: Not Progressing ? Pt had anxiety / agitation this shift. Pt had to be given haldol and was effective at decreasing anxiety so pt could rest.  ?Problem: Nutrition ?Goal: Nutritional status is improving ?Description: Monitor and assess patient for malnutrition (ex- brittle hair, bruises, dry skin, pale skin and conjunctiva, muscle wasting, smooth red tongue, and disorientation). Collaborate with interdisciplinary team and initiate plan and interventions as ordered.  Monitor patient's weight and dietary intake as ordered or per policy. Utilize nutrition screening tool and intervene per policy. Determine patient's food preferences and provide high-protein, high-caloric foods as appropriate.  ?Outcome: Progressing ?  ?Problem: Education: ?Goal: Knowledge of General Education information will improve ?Description: Including pain rating scale, medication(s)/side effects and non-pharmacologic comfort measures ?Outcome: Progressing ?  ?Problem: Health Behavior/Discharge Planning: ?Goal: Ability to manage health-related needs will improve ?Outcome: Progressing ?  ?Problem: Clinical Measurements: ?Goal: Ability to maintain clinical measurements within normal limits will improve ?Outcome: Progressing ?Goal: Will remain free from infection ?Outcome: Progressing ?Goal: Diagnostic test results will improve ?Outcome: Progressing ?Goal: Respiratory complications will improve ?Outcome: Progressing ?Goal: Cardiovascular complication will be avoided ?Outcome: Progressing ?  ?Problem: Activity: ?Goal: Risk for activity intolerance will decrease ?Outcome: Progressing ?  ?Problem: Nutrition: ?Goal: Adequate nutrition will be maintained ?Outcome: Progressing ?  ?Problem: Elimination: ?Goal: Will not experience complications related to bowel motility ?Outcome: Progressing ?Goal: Will not experience complications related to urinary retention ?Outcome: Progressing ?  ?Problem:  Pain Managment: ?Goal: General experience of comfort will improve ?Outcome: Progressing ?  ?Problem: Safety: ?Goal: Ability to remain free from injury will improve ?Outcome: Progressing ?  ?Problem: Skin Integrity: ?Goal: Risk for impaired skin integrity will decrease ?Outcome: Progressing ?  ?

## 2022-01-10 ENCOUNTER — Encounter: Payer: Self-pay | Admitting: Family Medicine

## 2022-01-10 DIAGNOSIS — R627 Adult failure to thrive: Secondary | ICD-10-CM | POA: Diagnosis not present

## 2022-01-10 DIAGNOSIS — Z515 Encounter for palliative care: Secondary | ICD-10-CM | POA: Diagnosis not present

## 2022-01-10 DIAGNOSIS — Z7189 Other specified counseling: Secondary | ICD-10-CM | POA: Diagnosis not present

## 2022-01-10 LAB — BASIC METABOLIC PANEL
Anion gap: 4 — ABNORMAL LOW (ref 5–15)
BUN: 17 mg/dL (ref 8–23)
CO2: 29 mmol/L (ref 22–32)
Calcium: 8.5 mg/dL — ABNORMAL LOW (ref 8.9–10.3)
Chloride: 104 mmol/L (ref 98–111)
Creatinine, Ser: 1.07 mg/dL (ref 0.61–1.24)
GFR, Estimated: >60 mL/min (ref >60)
Glucose, Bld: 97 mg/dL (ref 70–99)
Potassium: 3.4 mmol/L — ABNORMAL LOW (ref 3.5–5.1)
Sodium: 137 mmol/L (ref 135–145)

## 2022-01-10 LAB — CBC
HCT: 39.8 % (ref 39.0–52.0)
Hemoglobin: 13 g/dL (ref 13.0–17.0)
MCH: 32.7 pg (ref 26.0–34.0)
MCHC: 32.7 g/dL (ref 30.0–36.0)
MCV: 100 fL (ref 80.0–100.0)
Platelets: 105 10*3/uL — ABNORMAL LOW (ref 150–400)
RBC: 3.98 MIL/uL — ABNORMAL LOW (ref 4.22–5.81)
RDW: 13 % (ref 11.5–15.5)
WBC: 4.4 10*3/uL (ref 4.0–10.5)
nRBC: 0 % (ref 0.0–0.2)

## 2022-01-10 LAB — GLUCOSE, CAPILLARY
Glucose-Capillary: 109 mg/dL — ABNORMAL HIGH (ref 70–99)
Glucose-Capillary: 222 mg/dL — ABNORMAL HIGH (ref 70–99)
Glucose-Capillary: 117 mg/dL — ABNORMAL HIGH (ref 70–99)
Glucose-Capillary: 157 mg/dL — ABNORMAL HIGH (ref 70–99)

## 2022-01-10 LAB — MAGNESIUM: Magnesium: 1.9 mg/dL (ref 1.7–2.4)

## 2022-01-10 MED ORDER — PHENAZOPYRIDINE HCL 100 MG PO TABS
100.0000 mg | ORAL_TABLET | Freq: Three times a day (TID) | ORAL | Status: DC | PRN
  Administered 2022-01-10: 100 mg via ORAL
  Filled 2022-01-10 (×2): qty 1

## 2022-01-10 MED ORDER — CHLORHEXIDINE GLUCONATE CLOTH 2 % EX PADS
6.0000 | MEDICATED_PAD | Freq: Every day | CUTANEOUS | Status: DC
  Administered 2022-01-10 – 2022-01-12 (×3): 6 via TOPICAL

## 2022-01-10 MED ORDER — POLYETHYLENE GLYCOL 3350 17 G PO PACK
17.0000 g | PACK | Freq: Every day | ORAL | Status: DC
  Administered 2022-01-10 – 2022-01-12 (×3): 17 g via ORAL
  Filled 2022-01-10 (×3): qty 1

## 2022-01-10 MED ORDER — SENNOSIDES-DOCUSATE SODIUM 8.6-50 MG PO TABS
1.0000 | ORAL_TABLET | Freq: Two times a day (BID) | ORAL | Status: DC
  Administered 2022-01-10 – 2022-01-12 (×5): 1 via ORAL
  Filled 2022-01-10 (×5): qty 1

## 2022-01-10 MED ORDER — OXYBUTYNIN CHLORIDE 5 MG PO TABS
5.0000 mg | ORAL_TABLET | Freq: Three times a day (TID) | ORAL | Status: DC | PRN
  Administered 2022-01-10 – 2022-01-12 (×3): 5 mg via ORAL
  Filled 2022-01-10 (×3): qty 1

## 2022-01-10 MED ORDER — POTASSIUM CHLORIDE 20 MEQ PO PACK
40.0000 meq | PACK | Freq: Once | ORAL | Status: AC
Start: 2022-01-10 — End: 2022-01-10
  Administered 2022-01-10: 40 meq via ORAL
  Filled 2022-01-10: qty 2

## 2022-01-10 MED ORDER — MAGNESIUM SULFATE 2 GM/50ML IV SOLN
2.0000 g | Freq: Once | INTRAVENOUS | Status: AC
  Administered 2022-01-10: 2 g via INTRAVENOUS
  Filled 2022-01-10: qty 50

## 2022-01-10 MED ORDER — CEPHALEXIN 500 MG PO CAPS
500.0000 mg | ORAL_CAPSULE | Freq: Two times a day (BID) | ORAL | Status: DC
  Administered 2022-01-10 – 2022-01-12 (×5): 500 mg via ORAL
  Filled 2022-01-10 (×5): qty 1

## 2022-01-10 NOTE — Progress Notes (Signed)
?PROGRESS NOTE ? ? ? ?Kevin [name]  OIZ:[id number] DOB: [birth year] DOA: 01/07/2022 ?PCP: [name], MD  ? ? ? ?Brief Narrative:  ? ? ?DM2, HLD, nonischemic cardiomyopathy, second-degree heart block s/p PPM, prostate cancer s/p XRT on Proscar, dementia ?Brought to the ED from home by EMS, Patient has been steadily declining over the past few weeks. Patient has not been eating/drinking and has had a 30 lbs weight loss in 3 weeks. Patient was hypotensive upon EMS arrival. Patient c/o dizziness and weakness, --BP 74/62 per EMS ? ?Subjective: ? ? Calm and cooperative, confused and has much improved ,still slightly confused about the month but able self correct ?Reports being weaker than normal ?Reports feeling bladder is full, Denies pain, no fever ? ? ?Assessment & Plan: ? Principal Problem: ?  Failure to thrive in adult ?Active Problems: ?  Acute renal failure superimposed on stage 3a chronic kidney disease ([place]) ?  High anion gap metabolic acidosis ?  Lactic acidosis ?  Chronic combined systolic and diastolic heart failure ([place]) ?  Hypertension ?  DM2 (diabetes mellitus, type 2) ([place]) ?  Hyperlipidemia ?  Depression ?  Dementia ([place]) ?  FTT (failure to thrive) in adult ? ? ? ?Assessment and Plan: ? ?Dehydration/AKI on CKD 2/acute metabolic encephalopathy ? - urine culture 10,000 g- rods, with h/o urinary retention and confusion. Will treat with three days of keflex ?-Cr improving, Continue IV hydration ?-Repeat BMP in the morning, Renal dosing meds ? ?Acute urinary retention: ?500cc drained with in and out cath, foley inserted on 3/5 ? Increase activity,  ?Plan for voiding trial prior to discharge ? ? ? ?Hypokalemia ?Remain low , continue to Replace K ? ?Lactic acidosis, mild, resolved after hydration ? ?Elevated anion gap At 27 on presentation ?Normalized after hydration ? ?Microcytosis/Mild thrombocytopenia ?P38 250 ? start folic acid ?Monitor CBC, stable and improving  ? ?Chronic combined heart  failure/nonischemic cardiomyopathy/status post BiV ICD ?Currently appear dehydrated ?Continue gentle hydration ?Monitor volume status ? ?H/o Diet-controlled diabetes ?With poor oral intake, weight loss ?A1c 5.3 ?SSI ordered on admission, has not needed any ?Due to poor oral intake and weight loss, will order regular diet  ? ?History of prostate cancer ?Continue Proscar ? ?Dementia ?Continue Aricept and Namenda ?Calm and cooperative so fare ?Delirium precaution  ? ?Failure to thrive/decreased oral intake/significant weight loss frequent falls ?Appreciate palliative care input ?PT eval recommend snf placement  ? ?Underweight: Body mass index is 14.24 kg/m?Marland KitchenMarland Kitchen ?Seen by dietician.  I agree with the assessment and plan as outlined below: ?Nutrition Status: ?Nutrition Problem: Inadequate oral intake ?Etiology: poor appetite ?Signs/Symptoms: per patient/family report (underweight BMI) ?Interventions: Refer to RD note for recommendations ? ?. ? ? ?I have Reviewed nursing notes, Vitals, pain scores, I/o's, Lab results and  imaging results since pt's last encounter, details please see discussion above  ?I ordered the following labs:  ?Unresulted Labs (From admission, onward)  ? ?  Start     Ordered  ? 01/10/22 0500  CBC  Daily,   R     ? 01/09/22 0902  ? 01/10/22 5397  Basic metabolic panel  Daily,   R     ? 01/09/22 0902  ? ?  ?  ? ?  ? ? ? ?DVT prophylaxis: SCDs Start: 01/07/22 2054 ? ? ?Code Status:   Code Status: DNR ? ?Family Communication: Wife over the phone on 3/3 and 3/4 ?Disposition:  ? ?Status is: Inpatient ? ?Dispo: The patient is  from: Home ?             Anticipated d/c is to: SNF ?             Anticipated d/c date is:  medically stable to d/c to snf on 3/6, need voiding trial, follow-up on final urine culture result ? ?Antimicrobials:   ? ? ? ?Anti-infectives (From admission, onward)  ? ? Start     Dose/Rate Route Frequency Ordered Stop  ? 01/10/22 1200  cephALEXin (KEFLEX) capsule 500 mg       ? 500 mg Oral  Every 12 hours 01/10/22 1048 01/13/22 0959  ? ?  ? ? ? ? ? ?Objective: ?Vitals:  ? 01/09/22 1408 01/09/22 2128 01/09/22 2133 01/10/22 1303  ?BP: 95/65  93/62 125/87  ?Pulse: 75 75 76 90  ?Resp:   18 19  ?Temp: 97.6 ?F (36.4 ?C)  97.7 ?F (36.5 ?C) 98.1 ?F (36.7 ?C)  ?TempSrc: Oral  Oral Oral  ?SpO2: 96% 98% 98% 100%  ?Weight:      ?Height:      ? ? ?Intake/Output Summary (Last 24 hours) at 01/10/2022 1948 ?Last data filed at 01/10/2022 1257 ?Gross per 24 hour  ?Intake --  ?Output 1800 ml  ?Net -1800 ml  ? ?Filed Weights  ? 01/07/22 1637 01/08/22 1630  ?Weight: 51.7 kg 53.1 kg  ? ? ?Examination: ? ?General exam: Frail, weak, thin, aaox3 ?Respiratory system: Clear to auscultation. Respiratory effort normal. ?Cardiovascular system:  RRR.  ?Gastrointestinal system: Abdomen is nondistended, soft and nontender.  Normal bowel sounds heard. ?Central nervous system: Alert and interactive. No focal neurological deficits. ?Extremities:  no edema ?Skin: No rashes, lesions or ulcers ?Psychiatry: cooperative, no agitation.  ? ? ? ?Data Reviewed: I have personally reviewed  labs and visualized  imaging studies since the last encounter and formulate the plan  ? ? ? ? ? ? ?Scheduled Meds: ? atorvastatin  40 mg Oral QPM  ? buPROPion  150 mg Oral Daily  ? cephALEXin  500 mg Oral Q12H  ? Chlorhexidine Gluconate Cloth  6 each Topical Daily  ? cholecalciferol  1,000 Units Oral Daily  ? donepezil  5 mg Oral QHS  ? feeding supplement  237 mL Oral TID BM  ? folic acid  1 mg Oral Daily  ? insulin aspart  0-5 Units Subcutaneous QHS  ? insulin aspart  0-9 Units Subcutaneous TID WC  ? memantine  10 mg Oral BID  ? mirtazapine  15 mg Oral QHS  ? multivitamin with minerals  1 tablet Oral Daily  ? polyethylene glycol  17 g Oral Daily  ? senna-docusate  1 tablet Oral BID  ? ?Continuous Infusions: ? lactated ringers 75 mL/hr at 01/10/22 0630  ? ? ? LOS: 2 days  ? ? ? ? ?Florencia Reasons, MD PhD FACP ?[facility] ? ?Available via Epic secure chat 7am-7pm for  nonurgent issues ?Please page for urgent issues ?To page the attending provider between 7A-7P or the covering provider during after hours 7P-7A, please log into the web site [email or web address] and access using universal [place] password for that web site. If you do not have the password, please call the hospital operator. ? ? ? ?01/10/2022, 7:48 PM  ? ? ?

## 2022-01-10 NOTE — NC FL2 (Signed)
?[place] MEDICAID FL2 LEVEL OF CARE SCREENING TOOL  ?  ? ?IDENTIFICATION  ?Patient Name: ?Kevin [name] Birthdate: [birth year] Sex: male Admission Date (Current Location): ?01/07/2022  ?[place] Number: ? [place] ?  Facility and Address:  ?[facility],  [place] [employer], [place] ?     Provider Number: ?[id number]  ?Attending Physician Name and Address:  ?Florencia Reasons, MD ? Relative Name and Phone Number:  ?Wife - Nicolo Tomko - # [phone] ?   ?Current Level of Care: ?Hospital Recommended Level of Care: ?[place] Prior Approval Number: ?  ? ?Date Approved/Denied: ?01/10/22 PASRR Number: ?[id number] A ? ?Discharge Plan: ?SNF ?  ? ?Current Diagnoses: ?Patient Active Problem List  ? Diagnosis Date Noted  ? FTT (failure to thrive) in adult 01/08/2022  ? Acute renal failure superimposed on stage 3a chronic kidney disease ([place]) 01/07/2022  ? Failure to thrive in adult 01/07/2022  ? High anion gap metabolic acidosis 29/93/7169  ? Lactic acidosis 01/07/2022  ? Depression 01/07/2022  ? Dementia ([place]) 01/07/2022  ? Hypotension 11/26/2021  ? UTI (urinary tract infection) 11/26/2021  ? Urinary retention with incomplete bladder emptying 11/26/2021  ? SDH (subdural hematoma) 11/10/2021  ? Subdural hematoma 11/05/2021  ? Carcinoma of prostate ([place]) 11/05/2021  ? Chronic kidney disease, stage 3 unspecified ([place]) 11/05/2021  ? Malnutrition of moderate degree 10/28/2021  ? Fracture of multiple pubic rami with routine healing, right 10/24/2021  ? Fall 10/21/2021  ? Pelvic hematoma in male 10/21/2021  ? Pubic ramus fracture ([place]) 10/21/2021  ? Acute blood loss anemia 10/21/2021  ? Acute COVID-19 06/16/2021  ? Chronic combined systolic and diastolic heart failure ([place]) 09/28/2017  ? 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 in situ 09/28/2017  ? Hx of adenomatous colonic polyps 10/20/2016  ? Malignant neoplasm of prostate ([place]) 01/19/2016  ? Chronic systolic  dysfunction of left ventricle 01/22/2015  ? Left bundle branch block 11/24/2014  ? Sick sinus syndrome ([place]) 11/24/2014  ? Nonischemic cardiomyopathy ([place]) 11/23/2013  ? Ventricular tachycardia 11/23/2013  ? DM2 (diabetes mellitus, type 2) ([place]) 08/27/2013  ? Hyperlipidemia 08/27/2013  ? Condyloma 05/14/2013  ? Penile abnormality 05/05/2013  ? 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/01/2013  ? Penile mass 02/19/2013  ? Hypertension 10/16/2012  ? Rectal discharge 07/14/2012  ? Jaw pain 07/14/2012  ? 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 Insomnia 04/16/2011  ? Anxiety 04/16/2011  ? ? ?Orientation RESPIRATION BLADDER Height & Weight   ?  ?Self, Time, Situation, Place ? Normal External catheter Weight: 117 lb (53.1 kg) ?Height:  '6\' 4"'$  (193 cm)  ?BEHAVIORAL SYMPTOMS/MOOD NEUROLOGICAL BOWEL NUTRITION STATUS  ? ([place])   Continent Diet (Regular)  ?AMBULATORY STATUS COMMUNICATION OF NEEDS Skin   ?Limited Assist Verbally Normal ?  ?  ?  ?    ?     ?     ? ? ?Personal Care Assistance Level of Assistance  ?Bathing, Dressing, Feeding Bathing Assistance: Limited assistance ?Feeding assistance: Independent ?Dressing Assistance: Limited assistance ?   ? 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 ? ?SPECIAL CARE FACTORS FREQUENCY  ?PT (By licensed PT), OT (By licensed OT)   ?  ?PT Frequency: Minimal 5 X's Per Week ?OT Frequency: Minimal 5 X's Per Week ?  ?  ?  ?   ? ? ?Contractures Contractures Info: Not present  ? ? ?Additional Factors  Info  ?Code Status, Allergies, Psychotropic, Insulin Sliding Scale Code Status Info: DNR ?Allergies Info: 5-Alpha Reductase Inhibitors ?Psychotropic Info: Bupropion (WELLBUTRIN XL) 24 hr tablet, 150 mg, po, daily; Memantine (NAMENDA) 10 mg, po, daily; Mirtazapine (REMERON SOL-TAB) Disintegrating tablet, 15 mg, po, daily; Haloperidol Lactate (HALDOL) Injection, 2  mg; Donepezil (ARICEPT) 5 mg, po, daily ?Insulin Sliding Scale Info: insulin aspart (novoLOG) injection 0-9 Units - 3 times per day with meals ?  ?   ? ?Current Medications (01/10/2022):  This is the current hospital active medication list ?Current Facility-Administered Medications  ?Medication Dose Route Frequency Provider Last Rate Last Admin  ? acetaminophen (TYLENOL) tablet 650 mg  650 mg Oral Q6H PRN Shela Leff, MD      ? Or  ? acetaminophen (TYLENOL) suppository 650 mg  650 mg Rectal Q6H PRN Shela Leff, MD      ? atorvastatin (LIPITOR) tablet 40 mg  40 mg Oral QPM Florencia Reasons, MD   40 mg at 01/09/22 1835  ? buPROPion (WELLBUTRIN XL) 24 hr tablet 150 mg  150 mg Oral Daily Pershing Proud, NP   616 mg at 01/10/22 1104  ? cephALEXin (KEFLEX) capsule 500 mg  500 mg Oral Q12H Florencia Reasons, MD   500 mg at 01/10/22 1357  ? Chlorhexidine Gluconate Cloth 2 % PADS 6 each  6 each Topical Daily Florencia Reasons, MD      ? cholecalciferol (VITAMIN D) tablet 1,000 Units  1,000 Units Oral Daily Florencia Reasons, MD   1,000 Units at 01/10/22 1104  ? donepezil (ARICEPT) tablet 5 mg  5 mg Oral QHS Pershing Proud, NP   5 mg at 01/09/22 2138  ? feeding supplement (ENSURE ENLIVE / ENSURE PLUS) liquid 237 mL  237 mL Oral TID BM Florencia Reasons, MD   237 mL at 01/09/22 2033  ? folic acid (FOLVITE) tablet 1 mg  1 mg Oral Daily Florencia Reasons, MD   1 mg at 01/10/22 1104  ? insulin aspart (novoLOG) injection 0-5 Units  0-5 Units Subcutaneous QHS Shela Leff, MD      ? insulin aspart (novoLOG) injection 0-9 Units  0-9 Units Subcutaneous TID WC Shela Leff, MD   3 Units at 01/10/22 1348  ? lactated ringers infusion   Intravenous Continuous Florencia Reasons, MD 75 mL/hr at 01/10/22 0630 New Bag at 01/10/22 0630  ? memantine (NAMENDA) tablet 10 mg  10 mg Oral BID Pershing Proud, NP   10 mg at 01/10/22 1103  ? mirtazapine (REMERON SOL-TAB) disintegrating tablet 15 mg  15 mg Oral QHS Pershing Proud, NP   15 mg at 01/09/22 2138  ? multivitamin with  minerals tablet 1 tablet  1 tablet Oral Daily Florencia Reasons, MD   1 tablet at 01/10/22 1104  ? polyethylene glycol (MIRALAX / GLYCOLAX) packet 17 g  17 g Oral Daily Florencia Reasons, MD   17 g at 01/10/22 1348  ? senna-docusate (Senokot-S) tablet 1 tablet  1 tablet Oral BID Florencia Reasons, MD   1 tablet at 01/10/22 1348  ? ? ? ?Discharge Medications: ?Please see discharge summary for a list of discharge medications. ? ?Relevant Imaging Results: ? ?Relevant Lab Results: ? ? ?Additional Information ?SS#: [id number] ? ?Kanaya Gunnarson, Marta Lamas, LCSW ? ? ? ? ?

## 2022-01-10 NOTE — NC FL2 (Deleted)
?[place] MEDICAID FL2 LEVEL OF CARE SCREENING TOOL  ?  ? ?IDENTIFICATION  ?Patient Name: ?Kevin [name] Birthdate: September 24, 1946 Sex: male Admission Date (Current Location): ?01/07/2022  ?[place] Number: ? [place] ?  Facility and Address:  ?[facility],  [place], [place] ?     Provider Number: ?[id number]  ?Attending Physician Name and Address:  ?Florencia Reasons, MD ? Relative Name and Phone Number:  ?Wife - [name] - # [phone] ?   ?Current Level of Care: ?Hospital Recommended Level of Care: ?[place] Prior Approval Number: ?  ? ?Date Approved/Denied: ?01/10/22 PASRR Number: ?[id number] A ? ?Discharge Plan: ?SNF ?  ? ?Current Diagnoses: ?Patient Active Problem List  ? Diagnosis Date Noted  ? FTT (failure to thrive) in adult 01/08/2022  ? Acute renal failure superimposed on stage 3a chronic kidney disease ([place]) 01/07/2022  ? Failure to thrive in adult 01/07/2022  ? High anion gap metabolic acidosis 03/54/6568  ? Lactic acidosis 01/07/2022  ? Depression 01/07/2022  ? Dementia ([place]) 01/07/2022  ? Hypotension 11/26/2021  ? UTI (urinary tract infection) 11/26/2021  ? Urinary retention with incomplete bladder emptying 11/26/2021  ? SDH (subdural hematoma) 11/10/2021  ? Subdural hematoma 11/05/2021  ? Carcinoma of prostate ([place]) 11/05/2021  ? Chronic kidney disease, stage 3 unspecified ([place]) 11/05/2021  ? Malnutrition of moderate degree 10/28/2021  ? Fracture of multiple pubic rami with routine healing, right 10/24/2021  ? Fall 10/21/2021  ? Pelvic hematoma in male 10/21/2021  ? Pubic ramus fracture ([place]) 10/21/2021  ? Acute blood loss anemia 10/21/2021  ? Acute COVID-19 06/16/2021  ? Chronic combined systolic and diastolic heart failure ([place]) 09/28/2017  ? 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 in situ 09/28/2017  ? Hx of adenomatous colonic polyps 10/20/2016  ? Malignant neoplasm of prostate ([place]) 01/19/2016  ? Chronic systolic  dysfunction of left ventricle 01/22/2015  ? Left bundle branch block 11/24/2014  ? Sick sinus syndrome ([place]) 11/24/2014  ? Nonischemic cardiomyopathy ([place]) 11/23/2013  ? Ventricular tachycardia 11/23/2013  ? DM2 (diabetes mellitus, type 2) ([place]) 08/27/2013  ? Hyperlipidemia 08/27/2013  ? Condyloma 05/14/2013  ? Penile abnormality 05/05/2013  ? 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/01/2013  ? Penile mass 02/19/2013  ? Hypertension 10/16/2012  ? Rectal discharge 07/14/2012  ? Jaw pain 07/14/2012  ? 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 Insomnia 04/16/2011  ? Anxiety 04/16/2011  ? ? ?Orientation RESPIRATION BLADDER Height & Weight   ?  ?Self, Time, Situation, Place ? Normal External catheter Weight: 117 lb (53.1 kg) ?Height:  '6\' 4"'$  (193 cm)  ?BEHAVIORAL SYMPTOMS/MOOD NEUROLOGICAL BOWEL NUTRITION STATUS  ? ([place])   Continent Diet  ?AMBULATORY STATUS COMMUNICATION OF NEEDS Skin   ?Limited Assist Verbally Normal ?  ?  ?  ?    ?     ?     ? ? ?Personal Care Assistance Level of Assistance  ?Bathing, Dressing Bathing Assistance: Maximum assistance ?  ?Dressing Assistance: Maximum assistance ?   ? 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   ? ? ?SPECIAL CARE FACTORS FREQUENCY  ?PT (By licensed PT), OT (By licensed OT)   ?  ?PT Frequency: Minimal 5 X's Per Week ?OT Frequency: Minimal 5 X's Per Week ?  ?  ?  ?   ? ? ?Contractures Contractures Info: Not present  ? ? ?Additional Factors Info  ?Code Status,  Allergies, Psychotropic Code Status Info: DNR ?Allergies Info: 5-Alpha Reductase Inhibitors ?Psychotropic Info: Bupropion (WELLBUTRIN XL) 24 hr tablet, 150 mg, po, daily; Memantine (NAMENDA) 10 mg, po, daily; Mirtazapine (REMERON SOL-TAB) Disintegrating tablet, 15 mg, po, daily; Haloperidol Lactate (HALDOL) Injection, 2 mg; Donepezil (ARICEPT) 5 mg, po, daily ?  ?  ?   ? ?Current Medications (01/10/2022):  This  is the current hospital active medication list ?Current Facility-Administered Medications  ?Medication Dose Route Frequency Provider Last Rate Last Admin  ? acetaminophen (TYLENOL) tablet 650 mg  650 mg Oral Q6H PRN Shela Leff, MD      ? Or  ? acetaminophen (TYLENOL) suppository 650 mg  650 mg Rectal Q6H PRN Shela Leff, MD      ? atorvastatin (LIPITOR) tablet 40 mg  40 mg Oral QPM Florencia Reasons, MD   40 mg at 01/09/22 1835  ? buPROPion (WELLBUTRIN XL) 24 hr tablet 150 mg  150 mg Oral Daily Pershing Proud, NP   408 mg at 01/10/22 1104  ? cephALEXin (KEFLEX) capsule 500 mg  500 mg Oral Q12H Florencia Reasons, MD   500 mg at 01/10/22 1357  ? Chlorhexidine Gluconate Cloth 2 % PADS 6 each  6 each Topical Daily Florencia Reasons, MD      ? cholecalciferol (VITAMIN D) tablet 1,000 Units  1,000 Units Oral Daily Florencia Reasons, MD   1,000 Units at 01/10/22 1104  ? donepezil (ARICEPT) tablet 5 mg  5 mg Oral QHS Pershing Proud, NP   5 mg at 01/09/22 2138  ? feeding supplement (ENSURE ENLIVE / ENSURE PLUS) liquid 237 mL  237 mL Oral TID BM Florencia Reasons, MD   237 mL at 01/09/22 2033  ? folic acid (FOLVITE) tablet 1 mg  1 mg Oral Daily Florencia Reasons, MD   1 mg at 01/10/22 1104  ? insulin aspart (novoLOG) injection 0-5 Units  0-5 Units Subcutaneous QHS Shela Leff, MD      ? insulin aspart (novoLOG) injection 0-9 Units  0-9 Units Subcutaneous TID WC Shela Leff, MD   3 Units at 01/10/22 1348  ? lactated ringers infusion   Intravenous Continuous Florencia Reasons, MD 75 mL/hr at 01/10/22 0630 New Bag at 01/10/22 0630  ? memantine (NAMENDA) tablet 10 mg  10 mg Oral BID Pershing Proud, NP   10 mg at 01/10/22 1103  ? mirtazapine (REMERON SOL-TAB) disintegrating tablet 15 mg  15 mg Oral QHS Pershing Proud, NP   15 mg at 01/09/22 2138  ? multivitamin with minerals tablet 1 tablet  1 tablet Oral Daily Florencia Reasons, MD   1 tablet at 01/10/22 1104  ? polyethylene glycol (MIRALAX / GLYCOLAX) packet 17 g  17 g Oral Daily Florencia Reasons, MD   17 g at 01/10/22  1348  ? senna-docusate (Senokot-S) tablet 1 tablet  1 tablet Oral BID Florencia Reasons, MD   1 tablet at 01/10/22 1348  ? ? ? ?Discharge Medications: ?Please see discharge summary for a list of discharge medications. ? ?Relevant Imaging Results: ? ?Relevant Lab Results: ? ? ?Additional Information ?SS#: [id number] ? ?Philomene Haff, Marta Lamas, LCSW ? ? ? ? ?

## 2022-01-10 NOTE — Progress Notes (Signed)
?   01/10/22 1100  ?Mobility  ?Activity Ambulated with assistance in hallway  ?Level of Assistance Minimal assist, patient does 75% or more  ?Assistive Device Front wheel walker  ?Distance Ambulated (ft) 25 ft  ?Activity Response Tolerated well  ?$Mobility charge 1 Mobility  ? ?Pt agreeable to mobilize this morning. Ambulated about 39f in hall with RW, tolerated well. Pt was a bit off balance, so session was kept sort. Left pt in chair, call bell at side. RN/NT notified of session. MD present. ? ?KRudell Cobb ?Mobility Specialist ?Acute Rehab Services ?Office: [phone]? ? ?

## 2022-01-10 NOTE — TOC Transition Note (Signed)
Transition of Care (TOC) - CM/SW Discharge Note ? ? ?Patient Details  ?Name: Kevin [name] ?MRN: [id number] ?Date of Birth: Jun 02, 1946 ? ?Transition of Care (TOC) CM/SW Contact:  ?Lorette Peterkin, Marta Lamas, LCSW ?Phone Number: ?01/10/2022, 2:55 PM ? ? ?Clinical Narrative:    ? ?FL-2 Form on chart and ready for review and signature by attending physician.  PASARR Screening completed, PASARR # pending.  Patient's wife, [name] Goerner is requesting skilled nursing facility placement at [facility] in [place], for short-term rehabilitative services.  FL-2 Form has been faxed via HUB, awaiting bed offers.  Patient has [facility] and [facility].  LCSW will follow-up with Mrs. Boys regarding bed offers. ? ? ?Final next level of care: [place] ?Barriers to Discharge: No Barriers Identified ? ? ?Patient Goals and CMS Choice ?Patient states their goals for this hospitalization and ongoing recovery are:: [place] [facility]acement for Rehabilitative Services. ?CMS Medicare.gov Compare Post Acute Care list provided to:: Patient ?Choice offered to / list presented to : Patient, Spouse ? ?Discharge Placement ?PASRR number recieved: 01/10/22 ?           ?  ?Patient to be transferred to facility by: [facility] ?Name of family member notified: Wife - Tibor Lemmons - # [phone] ?  ? ?Discharge Plan and Services ? ?SNF for short-term rehab. ?       ?DME Arranged: N/A ?DME Agency: NA ?  ?  ?Representative spoke with at DME Agency: N/A ?[place] Arranged: NA ?[place] Agency: NA ?  ?  ?Representative spoke with at [place]: NA ? ?Social Determinants of Health (SDOH) Interventions ?  ?N/A ? ?Readmission Risk Interventions ?No flowsheet data found. ? ? ? ? ?

## 2022-01-10 NOTE — [place] FL2 (Deleted)
?[place] MEDICAID FL2 LEVEL OF CARE SCREENING TOOL  ?  ? ?IDENTIFICATION  ?Patient Name: ?Kevin [name] Birthdate: 11-Jan-1946 Sex: male Admission Date (Current Location): ?01/07/2022  ?[place] and [place] Number: ? [place] ?  Facility and Address:  ?[facility],  [place], [place] ?     Provider Number: ?[id number]  ?Attending Physician Name and Address:  ?Florencia Reasons, MD ? Relative Name and Phone Number:  ?Wife - Tamarick Kovalcik - # [phone] ?   ?Current Level of Care: ?Hospital Recommended Level of Care: ?[place] Prior Approval Number: ?  ? ?Date Approved/Denied: ? 01/10/2022 PASRR Number: ? [id number] A ? ?Discharge Plan: ?SNF ?  ? ?Current Diagnoses: ?Patient Active Problem List  ? Diagnosis Date Noted  ? FTT (failure to thrive) in adult 01/08/2022  ? Acute renal failure superimposed on stage 3a chronic kidney disease ([place]) 01/07/2022  ? Failure to thrive in adult 01/07/2022  ? High anion gap metabolic acidosis 22/29/7989  ? Lactic acidosis 01/07/2022  ? Depression 01/07/2022  ? Dementia ([place]) 01/07/2022  ? Hypotension 11/26/2021  ? UTI (urinary tract infection) 11/26/2021  ? Urinary retention with incomplete bladder emptying 11/26/2021  ? SDH (subdural hematoma) 11/10/2021  ? Subdural hematoma 11/05/2021  ? Carcinoma of prostate ([place]) 11/05/2021  ? Chronic kidney disease, stage 3 unspecified ([place]) 11/05/2021  ? Malnutrition of moderate degree 10/28/2021  ? Fracture of multiple pubic rami with routine healing, right 10/24/2021  ? Fall 10/21/2021  ? Pelvic hematoma in male 10/21/2021  ? Pubic ramus fracture ([place]) 10/21/2021  ? Acute blood loss anemia 10/21/2021  ? Acute COVID-19 06/16/2021  ? Chronic combined systolic and diastolic heart failure ([place]) 09/28/2017  ? 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 in situ 09/28/2017  ? Hx of adenomatous colonic polyps 10/20/2016  ? Malignant neoplasm of prostate ([place]) 01/19/2016  ? Chronic systolic  dysfunction of left ventricle 01/22/2015  ? Left bundle branch block 11/24/2014  ? Sick sinus syndrome ([place]) 11/24/2014  ? Nonischemic cardiomyopathy ([place]) 11/23/2013  ? Ventricular tachycardia 11/23/2013  ? DM2 (diabetes mellitus, type 2) ([place]) 08/27/2013  ? Hyperlipidemia 08/27/2013  ? Condyloma 05/14/2013  ? Penile abnormality 05/05/2013  ? 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/01/2013  ? Penile mass 02/19/2013  ? Hypertension 10/16/2012  ? Rectal discharge 07/14/2012  ? Jaw pain 07/14/2012  ? 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 Insomnia 04/16/2011  ? Anxiety 04/16/2011  ? ? ?Orientation RESPIRATION BLADDER Height & Weight   ?  ?Self, Time, Situation, Place ? Normal External catheter Weight: 117 lb (53.1 kg) ?Height:  '6\' 4"'$  (193 cm)  ?BEHAVIORAL SYMPTOMS/MOOD NEUROLOGICAL BOWEL NUTRITION STATUS  ? (Pleasant and Cooperative)   Continent Diet  ?AMBULATORY STATUS COMMUNICATION OF NEEDS Skin   ?Limited Assist Verbally Normal ?  ?  ?  ?    ?     ?     ? ? ?Personal Care Assistance Level of Assistance  ?Bathing, Dressing Bathing Assistance: Maximum assistance ?  ?Dressing Assistance: Maximum assistance ?   ? 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   ? ? ?SPECIAL CARE FACTORS FREQUENCY  ?PT (By licensed PT), OT (By licensed OT)   ?  ?PT Frequency: Minimal 5 X's Per Week ?OT Frequency: Minimal 5 X's Per Week ?  ?  ?  ?   ? ? ?Contractures Contractures Info: Not present  ? ? ?Additional Factors Info  ?  Code Status, Allergies, Psychotropic Code Status Info: DNR ?Allergies Info: 5-Alpha Reductase Inhibitors ?Psychotropic Info: Bupropion (WELLBUTRIN XL) 24 hr tablet, 150 mg, po, daily; Memantine (NAMENDA) 10 mg, po, daily; Mirtazapine (REMERON SOL-TAB) Disintegrating tablet, 15 mg, po, daily; Haloperidol Lactate (HALDOL) Injection, 2 mg; Donepezil (ARICEPT) 5 mg, po, daily ?  ?  ?   ? ?Current Medications (01/10/2022):  This  is the current hospital active medication list ?Current Facility-Administered Medications  ?Medication Dose Route Frequency Provider Last Rate Last Admin  ? acetaminophen (TYLENOL) tablet 650 mg  650 mg Oral Q6H PRN Shela Leff, MD      ? Or  ? acetaminophen (TYLENOL) suppository 650 mg  650 mg Rectal Q6H PRN Shela Leff, MD      ? atorvastatin (LIPITOR) tablet 40 mg  40 mg Oral QPM Florencia Reasons, MD   40 mg at 01/09/22 1835  ? buPROPion (WELLBUTRIN XL) 24 hr tablet 150 mg  150 mg Oral Daily Pershing Proud, NP   818 mg at 01/10/22 1104  ? cephALEXin (KEFLEX) capsule 500 mg  500 mg Oral Q12H Florencia Reasons, MD   500 mg at 01/10/22 1357  ? Chlorhexidine Gluconate Cloth 2 % PADS 6 each  6 each Topical Daily Florencia Reasons, MD      ? cholecalciferol (VITAMIN D) tablet 1,000 Units  1,000 Units Oral Daily Florencia Reasons, MD   1,000 Units at 01/10/22 1104  ? donepezil (ARICEPT) tablet 5 mg  5 mg Oral QHS Pershing Proud, NP   5 mg at 01/09/22 2138  ? feeding supplement (ENSURE ENLIVE / ENSURE PLUS) liquid 237 mL  237 mL Oral TID BM Florencia Reasons, MD   237 mL at 01/09/22 2033  ? folic acid (FOLVITE) tablet 1 mg  1 mg Oral Daily Florencia Reasons, MD   1 mg at 01/10/22 1104  ? insulin aspart (novoLOG) injection 0-5 Units  0-5 Units Subcutaneous QHS Shela Leff, MD      ? insulin aspart (novoLOG) injection 0-9 Units  0-9 Units Subcutaneous TID WC Shela Leff, MD   3 Units at 01/10/22 1348  ? lactated ringers infusion   Intravenous Continuous Florencia Reasons, MD 75 mL/hr at 01/10/22 0630 New Bag at 01/10/22 0630  ? magnesium sulfate IVPB 2 g 50 mL  2 g Intravenous Once Florencia Reasons, MD 50 mL/hr at 01/10/22 1357 2 g at 01/10/22 1357  ? memantine (NAMENDA) tablet 10 mg  10 mg Oral BID Pershing Proud, NP   10 mg at 01/10/22 1103  ? mirtazapine (REMERON SOL-TAB) disintegrating tablet 15 mg  15 mg Oral QHS Pershing Proud, NP   15 mg at 01/09/22 2138  ? multivitamin with minerals tablet 1 tablet  1 tablet Oral Daily Florencia Reasons, MD   1 tablet at  01/10/22 1104  ? polyethylene glycol (MIRALAX / GLYCOLAX) packet 17 g  17 g Oral Daily Florencia Reasons, MD   17 g at 01/10/22 1348  ? senna-docusate (Senokot-S) tablet 1 tablet  1 tablet Oral BID Florencia Reasons, MD   1 tablet at 01/10/22 1348  ? ? ? ?Discharge Medications: ?Please see discharge summary for a list of discharge medications. ? ?Relevant Imaging Results: ? ?Relevant Lab Results: ? ? ?Additional Information ?SS#: [id number] ? ?Sigourney Portillo, Marta Lamas, LCSW ? ? ? ? ?

## 2022-01-11 DIAGNOSIS — R627 Adult failure to thrive: Secondary | ICD-10-CM | POA: Diagnosis not present

## 2022-01-11 DIAGNOSIS — N1831 Chronic kidney disease, stage 3a: Secondary | ICD-10-CM

## 2022-01-11 DIAGNOSIS — E119 Type 2 diabetes mellitus without complications: Secondary | ICD-10-CM

## 2022-01-11 DIAGNOSIS — N179 Acute kidney failure, unspecified: Principal | ICD-10-CM

## 2022-01-11 DIAGNOSIS — I5042 Chronic combined systolic (congestive) and diastolic (congestive) heart failure: Secondary | ICD-10-CM | POA: Diagnosis not present

## 2022-01-11 DIAGNOSIS — I10 Essential (primary) hypertension: Secondary | ICD-10-CM | POA: Diagnosis not present

## 2022-01-11 LAB — CBC
HCT: 41.2 % (ref 39.0–52.0)
Hemoglobin: 13.3 g/dL (ref 13.0–17.0)
MCH: 32.4 pg (ref 26.0–34.0)
MCHC: 32.3 g/dL (ref 30.0–36.0)
MCV: 100.5 fL — ABNORMAL HIGH (ref 80.0–100.0)
Platelets: 109 10*3/uL — ABNORMAL LOW (ref 150–400)
RBC: 4.1 MIL/uL — ABNORMAL LOW (ref 4.22–5.81)
RDW: 13 % (ref 11.5–15.5)
WBC: 5.3 10*3/uL (ref 4.0–10.5)
nRBC: 0 % (ref 0.0–0.2)

## 2022-01-11 LAB — BASIC METABOLIC PANEL
Anion gap: 7 (ref 5–15)
BUN: 13 mg/dL (ref 8–23)
CO2: 26 mmol/L (ref 22–32)
Calcium: 8.6 mg/dL — ABNORMAL LOW (ref 8.9–10.3)
Chloride: 106 mmol/L (ref 98–111)
Creatinine, Ser: 0.97 mg/dL (ref 0.61–1.24)
GFR, Estimated: >60 mL/min (ref >60)
Glucose, Bld: 118 mg/dL — ABNORMAL HIGH (ref 70–99)
Potassium: 3.8 mmol/L (ref 3.5–5.1)
Sodium: 139 mmol/L (ref 135–145)

## 2022-01-11 LAB — URINE CULTURE: Culture: 10000 — AB

## 2022-01-11 LAB — GLUCOSE, CAPILLARY
Glucose-Capillary: 152 mg/dL — ABNORMAL HIGH (ref 70–99)
Glucose-Capillary: 157 mg/dL — ABNORMAL HIGH (ref 70–99)
Glucose-Capillary: 162 mg/dL — ABNORMAL HIGH (ref 70–99)

## 2022-01-11 NOTE — Hospital Course (Addendum)
Mr. Fees was admitted to the hospital with the working diagnosis of acute metabolic encephalopathy.  ? ?76 yo male with the past medical history of dementia, hypertension, dyslipidemia, heart failure, T2DM, CKD and history of prostate cancer who presented with hypotension and failure to thrive. Recent hospitalization for mechanical fall and pelvic hematoma complicated with urinary retention. On his initial physical examination his blood pressure was 105/76, HR 88, RR 22 and oxygen saturation 98%, he had dry mucous membranes, lungs clear to auscultation, heart with S1 and S2 present and rhythmic, abdomen soft and no lower extremity edema.  ? ?Na 142, K 4,5 CL 96, bicarbonate 19, glucose 131, BUN 44 and cr at 1,59.  ?High sensitive troponin 12. ?Lactic acid 2,1 ?Wbc 8,5. Hgb 16,6, hct 51,3 and plt 125.  ?[place] covid 19 negative  ? ?Urina analysis specific gravity 1,019 with 11-20 wbc, many bacteria.  ? ?Chest radiograph with hyperinflation, no infiltrates. Pacer in place.  ? ?EKG 90 bpm, left axis deviation, interventricular conduction delay, prolonged QTc 532, sinus sensed and 100% ventricular paced, with no significant ST segment changes and negative T in V1 and V2.  ? ?Patient was placed on IV fluids and supportive medical therapy with good toleration. ?Positive urinary retention and required insertion of Foley cathter.  ?Treated for urinary tract infection (present on admission) with oral cephalexin for 3 days.  ? ?Physical therapy and occupational therapy did evaluate him during his hospitalization, with final recommendations to continue therapy at home with home health services.  ? ?

## 2022-01-11 NOTE — Progress Notes (Signed)
Physical Therapy Treatment ?Patient Details ?Name: Kevin [name] ?MRN: [id number] ?DOB: 1946-09-17 ?Today's Date: 01/11/2022 ? ? ?History of Present Illness Patient is a 76 year old male admitted with hypotension and failure to thrive on 01/07/22. Family reported patient having poor appetite, significant weight loss, and generalized weakness. 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 ?  ?PT Comments  ? ? Pt making good progress today.  He is ambulating 300' with RW and supervision.  Did need min cues for safety.  VSS on RA.  Continue plan of care.     ?Recommendations for follow up therapy are one component of a multi-disciplinary discharge planning process, led by the attending physician.  Recommendations may be updated based on patient status, additional functional criteria and insurance authorization. ? ?Follow Up Recommendations ? Home health PT ?  ?  ?Assistance Recommended at Discharge Frequent or constant Supervision/Assistance  ?Patient can return home with the following A little help with walking and/or transfers;A little help with bathing/dressing/bathroom;Help with stairs or ramp for entrance;Assistance with cooking/housework;Assist for transportation (supervison) ?  ?Equipment Recommendations ? None recommended by PT  ?  ?Recommendations for Other Services   ? ? ?  ?Precautions / Restrictions Precautions ?Precautions: Fall ?Restrictions ?Weight Bearing Restrictions: No  ?  ? ?Mobility ? Bed Mobility ?Overal bed mobility: Needs Assistance ?Bed Mobility: Supine to Sit, Sit to Supine ?  ?  ?Supine to sit: Supervision ?Sit to supine: Supervision ?  ?General bed mobility comments: Pt able to scoot up in bed ?  ? ?Transfers ?Overall transfer level: Needs assistance ?Equipment used:  Rolling walker (2 wheels) ?Transfers: Sit to/from Stand ?Sit to Stand: Min guard ?  ?  ?  ?  ?  ?General transfer comment: Min guard for safety; standing from low bed without assist (x 3 during session) ?  ? ?Ambulation/Gait ?Ambulation/Gait assistance: Min guard ?Gait Distance (Feet): 300 Feet ?Assistive device: Rolling walker (2 wheels) ?Gait Pattern/deviations: Step-through pattern ?Gait velocity: decr ?  ?  ?General Gait Details: min cues for safey with RW, but did not need phsycial assist ? ? ?Stairs ?  ?  ?  ?  ?  ? ? ?Wheelchair Mobility ?  ? ?Modified Rankin (Stroke Patients Only) ?  ? ? ?  ?Balance Overall balance assessment: History of Falls, Needs assistance ?Sitting-balance support: Feet supported ?Sitting balance-Leahy Scale: Good ?  ?  ?Standing balance support: Bilateral upper extremity supported, No upper extremity supported ?Standing balance-Leahy Scale: Fair ?Standing balance comment: RW to ambulate but could static stand, weight shift, don mask without AD ?  ?  ?  ?  ?  ?  ?  ?  ?  ?  ?  ?  ? ?  ?Cognition Arousal/Alertness: Awake/alert ?Behavior During Therapy: [facility] for tasks assessed/performed ?Overall Cognitive Status: History of cognitive impairments - at baseline ?  ?  ?  ?  ?  ?  ?  ?  ?  ?  ?  ?  ?  ?  ?  ?  ?General Comments: Patient very pleasant and cooperative. Difficulty stating month, oriented to year and place. Reports that he volunteers at [facility] and last did so 1 month ago.  No family present. ?  ?  ? ?  ?Exercises   ? ?  ?General Comments General comments (skin integrity, edema,  etc.): VSS on RA; pt asking about progress and his mobility ; pt expressing would like to go home at d/c and asked therapy's opinion of his mobility; discussed good progress, not need physical assist, would just need supervision for safety from a mobility perspective ?  ?  ? ?Pertinent Vitals/Pain Pain Assessment ?Pain Assessment: No/denies pain  ? ? ?Home Living   ?  ?  ?  ?  ?  ?  ?  ?  ?  ?   ?  ?Prior  Function    ?  ?  ?   ? ?PT Goals (current goals can now be found in the care plan section) Progress towards PT goals: Progressing toward goals ? ?  ?Frequency ? ? ? Min 3X/week ? ? ? ?  ?PT Plan Current plan remains appropriate  ? ? ?Co-evaluation   ?  ?  ?  ?  ? ?  ?AM-PAC PT "6 Clicks" Mobility   ?Outcome Measure ? Help needed turning from your back to your side while in a flat bed without using bedrails?: A Little ?Help needed moving from lying on your back to sitting on the side of a flat bed without using bedrails?: A Little ?Help needed moving to and from a bed to a chair (including a wheelchair)?: A Little ?Help needed standing up from a chair using your arms (e.g., wheelchair or bedside chair)?: A Little ?Help needed to walk in hospital room?: A Little ?Help needed climbing 3-5 steps with a railing? : A Little ?6 Click Score: 18 ? ?  ?End of Session Equipment Utilized During Treatment: Gait belt ?Activity Tolerance: Patient tolerated treatment well ?Patient left: in bed;with call bell/phone within reach ?Nurse Communication: Mobility status ?PT Visit Diagnosis: Unsteadiness on feet (R26.81);History of falling (Z91.81) ?  ? ? ?Time: 5784-6962 ?PT Time Calculation (min) (ACUTE ONLY): 26 min ? ?Charges:  $Gait Training: 8-22 mins ?$Therapeutic Activity: 8-22 mins          ?          ? ?Abran Richard, PT ?Acute Rehab Services ?Pager [phone] ?[facility] [phone] ? ? ? ?Mikael Spray Joscelynn Brutus ?01/11/2022, 12:00 PM ? ?

## 2022-01-11 NOTE — TOC Progression Note (Signed)
Transition of Care (TOC) - Progression Note  ? ? ?Patient Details  ?Name: Kevin [name] ?MRN: [id number] ?Date of Birth: 08-Apr-1946 ? ?Transition of Care (TOC) CM/SW Contact  ?Corabelle Spackman, Marjie Skiff, RN ?Phone Number: ?01/11/2022, 1:31 PM ? ?Clinical Narrative:    ?Spoke with pt at bedside to provide SNF bed offers. Pt states he is not going to SNF, that he is going home. He called his wife while I was in the room and told her the same thing. Wife asks that I set up home health services with [facility] ([facility]). Spoke with [facility] liaison who states that pt is already active with them. Will need MD orders for HHPT/OT/RN/Speech. ? ?Expected Discharge Plan: [place] ?Barriers to Discharge: Continued Medical Work up ? ?Expected Discharge Plan and Services ?Expected Discharge Plan: [place] ?  ?Discharge Planning Services: CM Consult ?Post Acute Care Choice: Home Health ?Living arrangements for the past 2 months: [place] ?                ?DME Arranged: N/A ?DME Agency: NA ?  ?  ?Representative spoke with at DME Agency: N/A ?[place] Arranged: RN, PT, OT, Speech Therapy ?[place] Agency: [place] ([place]) ?Date HH Agency Contacted: 01/11/22 ?Time Home: 1000 ?Representative spoke with at [place]: Corene Cornea ? ? ?Social Determinants of Health (SDOH) Interventions ?  ? ?Readmission Risk Interventions ?Readmission Risk Prevention Plan 01/11/2022  ?Transportation Screening Complete  ?PCP or Specialist Appt within 3-5 Days Complete  ?[place] or Home Care Consult Complete  ?Social Work Consult for [place] Planning/Counseling Complete  ?Palliative Care Screening Not Applicable  ?Medication Review Press photographer) Complete  ?Some recent data might be hidden  ? ? ?

## 2022-01-11 NOTE — Progress Notes (Addendum)
?  Progress Note ? ? ?Patient: Kevin [name] TMA:[id number] DOB: 07/11/1946 DOA: 01/07/2022     3 ?DOS: the patient was seen and examined on 01/11/2022 ?  ?Brief hospital course: ?Mr. Mont[place] was admitted to the hospital with the working diagnosis of acute metabolic encephalopathy.  ? ?76 yo male with the past medical history of dementia, hypertension, dyslipidemia, heart failure, T2DM, CKD and history of prostate cancer who presented with hypotension and failure to thrive. Recent hospitalization for mechanical fall and pelvic hematoma complicated with urinary retention. On his initial physical examination his blood pressure was 105/76, HR 88, RR 22 and oxygen saturation 98%, he had dry mucous membranes, lungs clear to auscultation, heart with S1 and S2 present and rhythmic, abdomen soft and no lower extremity edema.  ? ? ?Patient was placed on IV fluids and supportive medical therapy with good toleration. ?Positive urinary retention and required insertion of Foley cathter.  ? ?Physical therapy and occupational therapy have recommended SNF when medically stable.  ? ?Assessment and Plan: ?* Failure to thrive in adult ?Multifactorial failure to thrive ?Today with no chest pain or dyspnea. ?He has been able to work with physical and occupational therapy. ? ?Plan to transfer to SNF  ? ? ?Acute renal failure superimposed on stage 3a chronic kidney disease ([place]) ?Renal function has improved, patient is tolerating po well. ?Continue to hold on IV fluids.  ? ?Urinary retention with urinary tract infection, present on admission, continue 3 days of cephalexin  ? ?Chronic combined systolic and diastolic heart failure ([place]) ?Echo done in October 2021 showing EF 40 to 25%, grade 1 diastolic dysfunction. Not on any diuretics.   ? ?No clinical sings of exacerbation ?Hold on IV fluids and continue close monitoring of blood pressure.  ? ?Lactic acidosis ?Clinically resolved.  ? ?Hypertension ?Continue blood pressure monitoring.   ?Currently off antihypertensive medications.  ? ?DM2 (diabetes mellitus, type 2) ([place]) ?Glucose has been controlled, continue with insulin sliding scale for glucose cover and monitoring.  ? ?Hyperlipidemia ?On statin therapy  ? ?Depression ?Continue with mirtazapine and bupropion.  ?For dementia on donepezil and memantine.  ? ?Dementia ([place]) ?Continue donepezil and memantine  ? ? ? ? ?  ? ?Subjective: patient with no chest pain or dyspnea, tolerating po well, mild confusion and disorientation but not agitation  ? ?Physical Exam: ?Vitals:  ? 01/10/22 1303 01/10/22 2006 01/11/22 0434 01/11/22 1427  ?BP: 125/87 104/67 115/71 100/68  ?Pulse: 90 83 77 90  ?Resp: '19 16 16 16  '$ ?Temp: 98.1 ?F (36.7 ?C) 98 ?F (36.7 ?C) 98.1 ?F (36.7 ?C) 98.2 ?F (36.8 ?C)  ?TempSrc: Oral Oral Oral Oral  ?SpO2: 100% 100% 98% 100%  ?Weight:      ?Height:      ? ?Neurology awake and alert, mild confusion and disorientation  ?ENT with no pallor ?Cardiovascular with S1 and S2 present and rhythmic ?Respiratory with no wheezing, or rales ?Abdomen soft and non tender  ?Data Reviewed: ? ? ? ?Family Communication: no family at the bedside  ? ?Disposition: ?Status is: Inpatient ?Remains inpatient appropriate because: pending transfer to SNF  ? Planned Discharge Destination: Home and Skilled nursing facility ? ? ? ?Author: ?Tawni Millers, MD ?01/11/2022 7:21 PM ? ?For on call review [email or web address].  ?

## 2022-01-12 ENCOUNTER — Other Ambulatory Visit: Payer: Medicare HMO | Admitting: Family Medicine

## 2022-01-12 DIAGNOSIS — G9341 Metabolic encephalopathy: Secondary | ICD-10-CM

## 2022-01-12 DIAGNOSIS — E785 Hyperlipidemia, unspecified: Secondary | ICD-10-CM

## 2022-01-12 DIAGNOSIS — F0390 Unspecified dementia without behavioral disturbance: Secondary | ICD-10-CM

## 2022-01-12 DIAGNOSIS — E1169 Type 2 diabetes mellitus with other specified complication: Secondary | ICD-10-CM | POA: Diagnosis not present

## 2022-01-12 DIAGNOSIS — N179 Acute kidney failure, unspecified: Secondary | ICD-10-CM | POA: Diagnosis not present

## 2022-01-12 LAB — GLUCOSE, CAPILLARY
Glucose-Capillary: 199 mg/dL — ABNORMAL HIGH (ref 70–99)
Glucose-Capillary: 110 mg/dL — ABNORMAL HIGH (ref 70–99)
Glucose-Capillary: 166 mg/dL — ABNORMAL HIGH (ref 70–99)

## 2022-01-12 MED ORDER — FINASTERIDE 5 MG PO TABS
5.0000 mg | ORAL_TABLET | Freq: Every day | ORAL | Status: DC
  Administered 2022-01-12: 5 mg via ORAL
  Filled 2022-01-12: qty 1

## 2022-01-12 MED ORDER — ENSURE ENLIVE PO LIQD: 237.0000 mL | Freq: Three times a day (TID) | ORAL | 0 refills | Status: AC

## 2022-01-12 MED ORDER — FINASTERIDE 5 MG PO TABS: 5.0000 mg | ORAL_TABLET | Freq: Every day | ORAL | 0 refills | Status: AC

## 2022-01-12 MED ORDER — ADULT MULTIVITAMIN W/MINERALS CH: 1.0000 | ORAL_TABLET | Freq: Every day | ORAL | 0 refills | Status: AC

## 2022-01-12 NOTE — Progress Notes (Incomplete)
Palliative:  ***  Vinie Sill, NP Palliative Medicine Team Pager [phone] (Please see [email or web address] for schedule) Team Phone [phone]    Greater than 50%  of this time was spent counseling and coordinating care related to the above assessment and plan

## 2022-01-12 NOTE — Progress Notes (Signed)
Occupational Therapy Treatment ?Patient Details ?Name: Kevin [name] ?MRN: [id number] ?DOB: 1946-09-16 ?Today's Date: 01/12/2022 ? ? ?History of present illness Patient is a 76 year old male admitted with hypotension and failure to thrive on 01/07/22. Family reported patient having poor appetite, significant weight loss, and generalized weakness. 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  ?OT comments ? Patient with limited insight to deficits initially stating he does not need walker to ambulate to bathroom. Patient with lateral loss of balance needing min A to correct. Decreased standing tolerance to perform grooming task at mirror in bathroom needing to sit on bedside commode. Using walker patient min G to supervision level with ambulation to recliner chair. Anticipate patient can D/C home with [facility] and supervision for higher level IADL tasks. Acute OT to follow.   ? ?Recommendations for follow up therapy are one component of a multi-disciplinary discharge planning process, led by the attending physician.  Recommendations may be updated based on patient status, additional functional criteria and insurance authorization. ?   ?Follow Up Recommendations ? Home health OT  ?  ?Assistance Recommended at Discharge Frequent or constant Supervision/Assistance  ?Patient can return home with the following ? A little help with walking and/or transfers;A little help with bathing/dressing/bathroom;Direct supervision/assist for medications management;Direct supervision/assist for financial management;Assist for transportation;Help with stairs or ramp for entrance ?  ?Equipment Recommendations ? None recommended by OT  ?  ?   ?Precautions / Restrictions Precautions ?Precautions:  Fall ?Restrictions ?Weight Bearing Restrictions: No  ? ? ?  ? ?Mobility Bed Mobility ?Overal bed mobility: Modified Independent ?  ?  ?  ?  ?  ?  ?  ?  ? ? ?  ?Balance Overall balance assessment: History of Falls, Needs assistance ?Sitting-balance support: Feet supported ?Sitting balance-Leahy Scale: Good ?  ?  ?Standing balance support: No upper extremity supported, Reliant on assistive device for balance ?Standing balance-Leahy Scale: Poor ?Standing balance comment: Unable to ambulate safely without walker resulting in lateral loss of balance ?  ?  ?  ?  ?  ?  ?  ?  ?  ?  ?  ?   ? ?ADL either performed or assessed with clinical judgement  ? ?ADL Overall ADL's : Needs assistance/impaired ?  ?  ?Grooming: Set up;Min guard;Sitting;Standing ?Grooming Details (indicate cue type and reason): Patient with electric razor and initially instructed to perform standing at mirror needing min G for safety. Patient state he "can't stand that long" therefore placed bedside commode in front of mirror for patient to sit. Patient having difficulty getting razor to trim facial hair and ultimately states "I'll have to see a barber when I get home" ?  ?  ?  ?  ?  ?  ?  ?  ?Toilet Transfer: Min guard;Minimal assistance;Ambulation;Rolling walker (2 wheels) (none) ?Toilet Transfer Details (indicate cue type and reason): Initially patient stating he doesn't need the walker however has lateral loss of balance needing min A for safety. Provided with rolling walker to ambulate back to recliner at min G - supervision level ?  ?  ?  ?  ?Functional mobility during ADLs: Min guard;Minimal assistance;Rolling walker (2 wheels);Cueing for safety ?  ?  ? ? ? ?Cognition Arousal/Alertness: Awake/alert ?Behavior During Therapy: [facility] for tasks assessed/performed ?Overall Cognitive Status: History of cognitive impairments -  at baseline ?  ?  ?  ?  ?  ?  ?  ?  ?  ?  ?  ?  ?  ?  ?  ?  ?General Comments: Patient is very pleasant and cooperative. Does appear  to have insight to deficits stating he doesn't need walker however very unsteady without. ?  ?  ?   ?   ?   ?   ? ? ?Pertinent Vitals/ Pain       Pain Assessment ?Pain Assessment: No/denies pain ? ?   ?   ? ?Frequency ? Min 2X/week  ? ? ? ? ?  ?Progress Toward Goals ? ?OT Goals(current goals can now be found in the care plan section) ? Progress towards OT goals: Progressing toward goals ? ?Acute Rehab OT Goals ?Patient Stated Goal: Home to take care of spouse ?OT Goal Formulation: With patient ?Time For Goal Achievement: 01/22/22 ?Potential to Achieve Goals: Good ?ADL Goals ?Pt Will Perform Lower Body Dressing: with min assist;with adaptive equipment;sit to/from stand;sitting/lateral leans (A/E as needed) ?Pt Will Transfer to Toilet: ambulating;with modified independence (walker) ?Pt Will Perform Toileting - Clothing Manipulation and hygiene: with modified independence;sit to/from stand;sitting/lateral leans  ?Plan Discharge plan remains appropriate   ? ?   ?AM-PAC OT "6 Clicks" Daily Activity     ?Outcome Measure ? ? Help from another person eating meals?: None ?Help from another person taking care of personal grooming?: A Little ?Help from another person toileting, which includes using toliet, bedpan, or urinal?: A Little ?Help from another person bathing (including washing, rinsing, drying)?: A Little ?Help from another person to put on and taking off regular upper body clothing?: A Little ?Help from another person to put on and taking off regular lower body clothing?: A Lot ?6 Click Score: 18 ? ?  ?End of Session Equipment Utilized During Treatment: Rolling walker (2 wheels) ? ?OT Visit Diagnosis: Other abnormalities of gait and mobility (R26.89) ?  ?Activity Tolerance Patient tolerated treatment well ?  ?Patient Left in chair;with call bell/phone within reach;with chair alarm set ?  ?Nurse Communication Mobility status ?  ? ?   ? ?Time: 3570-1779 ?OT Time Calculation (min): 17 min ? ?Charges: OT General  Charges ?$OT Visit: 1 Visit ?OT Treatments ?$Self Care/Home Management : 8-22 mins ? ?Delbert Phenix OT ?OT pager: [phone] ? ? ?Rosemary Holms ?01/12/2022, 12:10 PM ?

## 2022-01-12 NOTE — Progress Notes (Signed)
?   01/12/22 1100  ?Mobility  ?Activity Ambulated with assistance in hallway  ?Level of Assistance Contact guard assist, steadying assist  ?Assistive Device Front wheel walker  ?Distance Ambulated (ft) 450 ft  ?Activity Response Tolerated well  ?$Mobility charge 1 Mobility  ? ?Pt agreeable to mobilize this morning. Ambulated about 455f in hall with RW, tolerated well. No complaints. Left pt EOB with razor and shaving cream per his request, call bell at side. RN/NT notified of session.  ? ?KRudell Cobb ?Mobility Specialist ?Acute Rehab Services ?Office: [phone]? ? ?

## 2022-01-12 NOTE — Discharge Summary (Signed)
Physician Discharge Summary   Patient: Kevin [name] MRN: [id number] DOB: 11-03-1946  Admit date:     01/07/2022  Discharge date: 01/12/22  Discharge Physician: Jimmy Picket Raine Elsass   PCP: [name], MD   Recommendations at discharge:    Patient received 3 days antibiotic therapy for urinary tract infection.  Follow up a outpatient with Dr. Elease Hashimoto.  Resume taking finasteride.   Discharge Diagnoses: Principal Problem:   Acute metabolic encephalopathy Active Problems:   Acute renal failure superimposed on stage 3a chronic kidney disease (HCC)   Chronic combined systolic and diastolic heart failure (HCC)   Hypertension   Type 2 diabetes mellitus with hyperlipidemia (HCC)   Depression   Dementia ([place])  Resolved Problems:   Lactic acidosis  Hospital Course: Kevin [name] was admitted to the hospital with the working diagnosis of acute metabolic encephalopathy.   76 yo male with the past medical history of dementia, hypertension, dyslipidemia, heart failure, T2DM, CKD and history of prostate cancer who presented with hypotension and failure to thrive. Recent hospitalization for mechanical fall and pelvic hematoma complicated with urinary retention. On his initial physical examination his blood pressure was 105/76, HR 88, RR 22 and oxygen saturation 98%, he had dry mucous membranes, lungs clear to auscultation, heart with S1 and S2 present and rhythmic, abdomen soft and no lower extremity edema.   Na 142, K 4,5 CL 96, bicarbonate 19, glucose 131, BUN 44 and cr at 1,59.  High sensitive troponin 12. Lactic acid 2,1 Wbc 8,5. Hgb 16,6, hct 51,3 and plt 125.  Sars covid 19 negative   Urina analysis specific gravity 1,019 with 11-20 wbc, many bacteria.   Chest radiograph with hyperinflation, no infiltrates. Pacer in place.   EKG 90 bpm, left axis deviation, interventricular conduction delay, prolonged QTc 532, sinus sensed and 100% ventricular paced, with no  significant ST segment changes and negative T in V1 and V2.   Patient was placed on IV fluids and supportive medical therapy with good toleration. Positive urinary retention and required insertion of Foley cathter.  Treated for urinary tract infection (present on admission) with oral cephalexin for 3 days.   Physical therapy and occupational therapy did evaluate him during his hospitalization, with final recommendations to continue therapy at home with home health services.    Assessment and Plan: * Acute metabolic encephalopathy Multifactorial failure to thrive Today with no chest pain or dyspnea. He has been able to work with physical and occupational therapy.  At the time of his discharge he was considered safe for discharge home with home health services.    Acute renal failure superimposed on stage 3a chronic kidney disease (HCC) Hypokalemia.  Patient was placed on IV fluids with good toleration.  Renal function has improved, at the time of his discharge his serum cr is 0,97 with K at 3,8 and serum bicarbonate at 26. Patient is tolerating po well.   He had urinary retention and required Foley catheter placement. Plan to do a voiding trial before his discharge.   Plan to follow up as outpatient.   Chronic combined systolic and diastolic heart failure ([place]) Echo done in October 2021 showing EF 40 to 15%, grade 1 diastolic dysfunction. Not on any diuretics.    No clinical sings of exacerbation Patient will follow up as outpatient.   Hypertension His blood pressure remained well controlled, at the time of his discharge systolic has been in the 400 to 129 mmHg range.  Patient is off antihypertensive medications.  Type 2 diabetes mellitus with hyperlipidemia (HCC) Patient was placed on insulin sliding scale for glucose cover and monitoring. His glucose remained well controlled.   Continue with statin therapy.   Depression Continue with mirtazapine and bupropion.  For  dementia on donepezil and memantine.   Dementia ([place]) Continue donepezil and memantine   Lactic acidosis-resolved as of 01/12/2022 Clinically resolved.          Consultants: none  Procedures performed: none   Disposition: Home Diet recommendation:  Cardiac diet DISCHARGE MEDICATION: Allergies as of 01/12/2022       Reactions   Sulfa Antibiotics Swelling   Swelling of lips only.   5-alpha Reductase Inhibitors    Patient and wife state OK for patient to take finasteride - has not taken it before, NKA        Medication List     TAKE these medications    acetaminophen 325 MG tablet Commonly known as: TYLENOL Take 1-2 tablets (325-650 mg total) by mouth every 4 (four) hours as needed for mild pain.   atorvastatin 80 MG tablet Commonly known as: LIPITOR Take 0.5 tablets (40 mg total) by mouth every evening.   buPROPion 150 MG 24 hr tablet Commonly known as: WELLBUTRIN XL Take 2 tablets in AM, 1 tablet in PM What changed:  how much to take how to take this when to take this additional instructions   cholecalciferol 25 MCG (1000 UNIT) tablet Commonly known as: VITAMIN D Take 1 tablet (1,000 Units total) by mouth daily.   donepezil 5 MG tablet Commonly known as: ARICEPT Take 1 tablet (5 mg total) by mouth daily.   EYE DROPS OP Place 1 drop into both eyes 3 (three) times daily. Given by [facility] doctor to help astigmtism   feeding supplement Liqd Take 237 mLs by mouth 3 (three) times daily between meals.   finasteride 5 MG tablet Commonly known as: PROSCAR Take 1 tablet (5 mg total) by mouth daily.   FreeStyle Libre 14 Day Sensor Misc SMARTSIG:1 Each Topical Every 2 Weeks   loratadine 10 MG tablet Commonly known as: CLARITIN Take 1 tablet (10 mg total) by mouth daily. What changed:  when to take this reasons to take this   magnesium gluconate 500 MG tablet Commonly known as: MAGONATE Take 0.5 tablets (250 mg total) by mouth daily.   memantine 10 MG  tablet Commonly known as: NAMENDA Take 1 tablet twice a day What changed:  how much to take how to take this when to take this additional instructions   mirtazapine 15 MG disintegrating tablet Commonly known as: REMERON SOL-TAB Take 1 tablet (15 mg total) by mouth at bedtime. What changed: additional instructions   multivitamin with minerals Tabs tablet Take 1 tablet by mouth daily. Start taking on: January 13, 2022   polyethylene glycol 17 g packet Commonly known as: MIRALAX / GLYCOLAX Take 17 g by mouth daily as needed for mild constipation.        Discharge Exam: Filed Weights   01/07/22 1637 01/08/22 1630  Weight: 51.7 kg 53.1 kg   Patient is feeling better, no dyspnea or chest pain.   BP 129/77 (BP Location: Left Arm)    Pulse 89    Temp 98 F (36.7 C) (Oral)    Resp 16    Ht '6\' 4"'$  (1.93 m)    Wt 53.1 kg    SpO2 100%    BMI 14.24 kg/m   Neurology awake and alert ENT with no pallor  or icterus Cardiovascular with S1 and S2 present and rhythmic, no gallops, rubs or murmurs. No lower extremity edema Respiratory with no wheezing or rhonchi, no rales Abdomen soft and non tender  Condition at discharge: stable  The results of significant diagnostics from this hospitalization (including imaging, microbiology, ancillary and laboratory) are listed below for reference.   Imaging Studies: DG Chest Port 1 View  Result Date: 01/07/2022 CLINICAL DATA:  Weakness and dizziness.  Weight loss. EXAM: PORTABLE CHEST 1 VIEW COMPARISON:  11/05/2021 FINDINGS: COPD with pulmonary hyperinflation. Lungs are clear without infiltrate or effusion Heart size normal. Negative for heart failure. AICD and pacer unchanged from the prior study. IMPRESSION: COPD.  No acute cardiopulmonary abnormality. Electronically Signed   By: Franchot Gallo M.D.   On: 01/07/2022 17:04    Microbiology: Results for orders placed or performed during the hospital encounter of 01/07/22  Resp Panel by RT-PCR (Flu  A&B, Covid) Nasopharyngeal Swab     Status: None   Collection Time: 01/07/22  4:40 PM   Specimen: Nasopharyngeal Swab; Nasopharyngeal(NP) swabs in vial transport medium  Result Value Ref Range Status   SARS Coronavirus 2 by RT PCR NEGATIVE NEGATIVE Final    Comment: (NOTE) SARS-CoV-2 target nucleic acids are NOT DETECTED.  The SARS-CoV-2 RNA is generally detectable in upper respiratory specimens during the acute phase of infection. The lowest concentration of SARS-CoV-2 viral copies this assay can detect is 138 copies/mL. A negative result does not preclude SARS-Cov-2 infection and should not be used as the sole basis for treatment or other patient management decisions. A negative result may occur with  improper specimen collection/handling, submission of specimen other than nasopharyngeal swab, presence of viral mutation(s) within the areas targeted by this assay, and inadequate number of viral copies(<138 copies/mL). A negative result must be combined with clinical observations, patient history, and epidemiological information. The expected result is Negative.  Fact Sheet for Patients:  EntrepreneurPulse.com.au  Fact Sheet for Healthcare Providers:  IncredibleEmployment.be  This test is no t yet approved or cleared by the [place] FDA and  has been authorized for detection and/or diagnosis of SARS-CoV-2 by FDA under an Emergency Use Authorization (EUA). This EUA will remain  in effect (meaning this test can be used) for the duration of the COVID-19 declaration under Section 564(b)(1) of the Act, 21 U.S.C.section 360bbb-3(b)(1), unless the authorization is terminated  or revoked sooner.       Influenza A by PCR NEGATIVE NEGATIVE Final   Influenza B by PCR NEGATIVE NEGATIVE Final    Comment: (NOTE) The Xpert Xpress SARS-CoV-2/FLU/RSV plus assay is intended as an aid in the diagnosis of influenza from Nasopharyngeal swab specimens  and should not be used as a sole basis for treatment. Nasal washings and aspirates are unacceptable for Xpert Xpress SARS-CoV-2/FLU/RSV testing.  Fact Sheet for Patients: EntrepreneurPulse.com.au  Fact Sheet for Healthcare Providers: IncredibleEmployment.be  This test is not yet approved or cleared by the [place] FDA and has been authorized for detection and/or diagnosis of SARS-CoV-2 by FDA under an Emergency Use Authorization (EUA). This EUA will remain in effect (meaning this test can be used) for the duration of the COVID-19 declaration under Section 564(b)(1) of the Act, 21 U.S.C. section 360bbb-3(b)(1), unless the authorization is terminated or revoked.  Performed at [facility], [employer] [address], [place], [place] [address]   Urine Culture     Status: Abnormal   Collection Time: 01/08/22  2:39 PM   Specimen: Urine, Clean  Catch  Result Value Ref Range Status   Specimen Description   Final    URINE, CLEAN CATCH Performed at [facility], [place] [address], [place], [place] [address]    Special Requests   Final    NONE Performed at [facility], [place] [address] [place], [place] [address]    Culture 10,000 COLONIES/mL KLEBSIELLA PNEUMONIAE (A)  Final   Report Status 01/11/2022 FINAL  Final   Organism ID, Bacteria KLEBSIELLA PNEUMONIAE (A)  Final      Susceptibility   Klebsiella pneumoniae - MIC*    AMPICILLIN >=32 RESISTANT Resistant     CEFAZOLIN <=4 SENSITIVE Sensitive     CEFEPIME <=0.12 SENSITIVE Sensitive     CEFTRIAXONE <=0.25 SENSITIVE Sensitive     CIPROFLOXACIN <=0.25 SENSITIVE Sensitive     GENTAMICIN <=1 SENSITIVE Sensitive     IMIPENEM <=0.25 SENSITIVE Sensitive     NITROFURANTOIN 64 INTERMEDIATE Intermediate     TRIMETH/SULFA <=20 SENSITIVE Sensitive     AMPICILLIN/SULBACTAM 4 SENSITIVE Sensitive     PIP/TAZO 8 SENSITIVE Sensitive     * 10,000 COLONIES/mL  KLEBSIELLA PNEUMONIAE    Labs: CBC: Recent Labs  Lab 01/07/22 1639 01/09/22 0535 01/10/22 0538 01/11/22 0509  WBC 8.5 4.7 4.4 5.3  NEUTROABS 7.3 3.3  --   --   HGB 16.6 13.4 13.0 13.3  HCT 51.3 41.0 39.8 41.2  MCV 101.8* 99.3 100.0 100.5*  PLT 125* 99* 105* 945*   Basic Metabolic Panel: Recent Labs  Lab 01/07/22 1639 01/07/22 1657 01/08/22 0900 01/09/22 0535 01/10/22 0538 01/11/22 0509  NA 142  --  136 134* 137 139  K 4.5  --  4.4 3.3* 3.4* 3.8  CL 96*  --  101 103 104 106  CO2 19*  --  '25 25 29 26  '$ GLUCOSE 131*  --  76 133* 97 118*  BUN 44*  --  38* 27* 17 13  CREATININE 1.59*  --  1.32* 1.13 1.07 0.97  CALCIUM 10.3  --  9.1 8.7* 8.5* 8.6*  MG  --  2.6*  --   --  1.9  --    Liver Function Tests: Recent Labs  Lab 01/07/22 1639  AST 23  ALT 10  ALKPHOS 104  BILITOT 1.2  PROT 7.0  ALBUMIN 3.5   CBG: Recent Labs  Lab 01/11/22 0742 01/11/22 1152 01/11/22 1654 01/12/22 0731 01/12/22 1131  GLUCAP 152* 157* 162* 110* 166*    Discharge time spent: greater than 30 minutes.  Signed: Tawni Millers, MD [facility] 01/12/2022

## 2022-01-12 NOTE — Progress Notes (Signed)
Pt stated at the beginning of this shift that he would be agreeable to go to [facility] for SNF. He understands that he is too weak to go home safely at this point. TOC may need to discuss discharge plan with pt/family again given his change of mind. Hortencia Conradi RN  ?

## 2022-01-13 ENCOUNTER — Telehealth: Payer: Self-pay

## 2022-01-13 NOTE — Telephone Encounter (Addendum)
Transition Care Management Unsuccessful Follow-up Telephone Call ? ?Date of discharge and from where:  [place] 01-12-22 Dx: acute metabolic encephalopathy ? ?Attempts:  1st Attempt ? ?Reason for unsuccessful TCM follow-up call:  Left voice message ? ?Transition Care Management Unsuccessful Follow-up Telephone Call ? ?Date of discharge and from where:  [place] 01-12-22 Dx: acute metabolic encephalopathy ? ?Attempts:  2nd Attempt ? ?Reason for unsuccessful TCM follow-up call:  Left voice message ? ? ?Transition Care Management Follow-up Telephone Call ?Date of discharge and from where: [place] 01-12-22 Dx: acute metabolic encephalopathy ?How have you been since you were released from the hospital? Eating good and sleeping well  ?Any questions or concerns? No ? ?Items Reviewed: ?Did the pt receive and understand the discharge instructions provided? Yes  ?Medications obtained and verified? Yes  ?Other? No  ?Any new allergies since your discharge? No  ?Dietary orders reviewed? Yes ?Do you have support at home? Yes  ? ?Home Care and Equipment/Supplies: ?Were home health services ordered? Yes- RN/ PT ?If so, what is the name of the agency? [facility]  ?Has the agency set up a time to come to the patient's home? yes ?Were any new equipment or medical supplies ordered?  No ?What is the name of the medical supply agency? na ?Were you able to get the supplies/equipment? no ?Do you have any questions related to the use of the equipment or supplies? No ? ?Functional Questionnaire: (I = Independent and D = Dependent) ?ADLs: D ? ?Bathing/Dressing- D ? ?Meal Prep- D ? ?Eating- I ? ?Maintaining continence- I ? ?Transferring/Ambulation- I WITH WALKER ? ?Managing Meds- D ? ?Follow up appointments reviewed: ? ?PCP Hospital f/u appt confirmed? Yes  Scheduled to see Dr  Elease Hashimoto on 01-26-22 @ 115pm. ?Specialist Hospital f/u appt confirmed? no ?Are transportation arrangements needed? No  ?If their  condition worsens, is the pt aware to call PCP or go to the Emergency Dept.? Yes ?Was the patient provided with contact information for the PCP's office or ED? Yes ?Was to pt encouraged to call back with questions or concerns? Yes  ?  ? ?  ?

## 2022-01-14 ENCOUNTER — Telehealth: Payer: Self-pay | Admitting: Pharmacist

## 2022-01-14 NOTE — Chronic Care Management (AMB) (Addendum)
? ? ?Chronic Care Management ?Pharmacy Assistant  ? ?Name: Kevin [name]  MRN: [id number] DOB: 1946-09-06 ? ?Reason for Encounter: Offer to reschedule missed encounter with Jeni Salles Clinical Pharmacist ? ?Recent office visits:  ?01/04/22 [name], MD - Patient presented for Adult failure to thrive and other concerns. Prescribed Mirtazapine. Stopped Cephalexin, Ciprofloxacin, Mineral Oil and Trazodone. ? ?Recent consult visits:  ?01/04/22 Marijo Conception, FNP - Patient presented for Palliative care and other concerns. No medication changes. ? ?11/26/21 [facility] A [facility] - [facility] presented  via Video for Chronic kidney disease and other concerns. No medication changes. ? ? ?Hospital visits:  ?Medication Reconciliation was completed by comparing discharge summary, patient?s EMR and Pharmacy list, and upon discussion with patient. ? ?Patient presented to [facility] ED on 01/07/22 due to Acute metabolic encephalopathy. Patient was present for 5 days. ? ?New?Medications Started at [facility] Discharge:?? ?-started  ?feeding supplement ?multivitamin with minerals ?Medication Changes at Hospital Discharge: ?-Changed  ?none ? ?Medications Discontinued at Hospital Discharge: ?-Stopped  ?none ? ?Medications that remain the same after Hospital Discharge:??  ?-All other medications will remain the same.   ? ? ? ?Patient presented to [facility] ED on 12/01/21 due to Weakness. Patient was present for 8 hours. ? ?New?Medications Started at [facility] Discharge:?? ?-started  ?none ? ?Medication Changes at Hospital Discharge: ?-Changed  ?none ? ?Medications Discontinued at Hospital Discharge: ?-Stopped  ?none ? ?Medications that remain the same after Hospital Discharge:??  ?-All other medications will remain the same.   ? ?Medications: ?Outpatient Encounter Medications as of 01/14/2022  ?Medication Sig Note  ? acetaminophen (TYLENOL) 325 MG tablet Take 1-2 tablets (325-650 mg total) by  mouth every 4 (four) hours as needed for mild pain.   ? atorvastatin (LIPITOR) 80 MG tablet Take 0.5 tablets (40 mg total) by mouth every evening.   ? buPROPion (WELLBUTRIN XL) 150 MG 24 hr tablet Take 2 tablets in AM, 1 tablet in PM (Patient taking differently: Take 150 mg by mouth daily.) 01/13/2022: Pt only takes 1AM  ? Carboxymethylcellulose Sodium (EYE DROPS OP) Place 1 drop into both eyes 3 (three) times daily. Given by [facility] doctor to help astigmtism   ? cholecalciferol (VITAMIN D) 25 MCG (1000 UNIT) tablet Take 1 tablet (1,000 Units total) by mouth daily.   ? Continuous Blood Gluc Sensor (FREESTYLE LIBRE 14 DAY SENSOR) MISC SMARTSIG:1 Each Topical Every 2 Weeks   ? donepezil (ARICEPT) 5 MG tablet Take 1 tablet (5 mg total) by mouth daily.   ? feeding supplement (ENSURE ENLIVE / ENSURE PLUS) LIQD Take 237 mLs by mouth 3 (three) times daily between meals.   ? finasteride (PROSCAR) 5 MG tablet Take 1 tablet (5 mg total) by mouth daily.   ? loratadine (CLARITIN) 10 MG tablet Take 1 tablet (10 mg total) by mouth daily. (Patient taking differently: Take 10 mg by mouth daily as needed for rhinitis.)   ? magnesium gluconate (MAGONATE) 500 MG tablet Take 0.5 tablets (250 mg total) by mouth daily.   ? memantine (NAMENDA) 10 MG tablet Take 1 tablet twice a day (Patient taking differently: Take 10 mg by mouth 2 (two) times daily.)   ? mirtazapine (REMERON SOL-TAB) 15 MG disintegrating tablet Take 1 tablet (15 mg total) by mouth at bedtime. (Patient taking differently: Take 15 mg by mouth at bedtime. For appetite)   ? Multiple Vitamin (MULTIVITAMIN WITH MINERALS) TABS tablet Take 1 tablet by mouth daily.   ?  polyethylene glycol (MIRALAX / GLYCOLAX) 17 g packet Take 17 g by mouth daily as needed for mild constipation.   ? ?No facility-administered encounter medications on file as of 01/14/2022.  ?Notes: ?Call to patient to offer to reschedule missed appointment with [facility] Pharmacist. Did not reach left  voicemail with my contact information for return call. ? ?Care Gaps: ?Hepatitis C Screening - Overdue ?Zoster Vaccine - Overdue ?Colonoscopy - Overdue ?COVID Booster - Overdue ?Flu Vaccine - Overdue ?Foot Exam - Overdue ?Urine Micro - Overdue ?PNA Vaccine - Overdue ?AWV- 12/21 ?BP- 123/91 ( 01/12/22) ?Lab Results  ?Component Value Date  ? HGBA1C 5.3 01/08/2022  ? ? ?Star Rating Drugs: ?Atorvastatin  80 mg - Last filled 11/02/21 60 DS at [facility] ?Donepezil 5 mg - Last filled 12/10/21 90 DS at [facility] ?Verified as accurate for above. ? ?Ned Clines CMA ?Clinical Pharmacist Assistant ?[phone] ? ?

## 2022-01-15 ENCOUNTER — Telehealth: Payer: Self-pay

## 2022-01-15 NOTE — Progress Notes (Signed)
Unable to reach patient for monthly ICM remote follow up and not receiving monthly remote transmission.  Patient disenrolled due patient is not actively participating in ICM clinic.  Device clinic 91 day remote monitoring will continue per protocol.   ?

## 2022-01-15 NOTE — Telephone Encounter (Signed)
Spoke with patient's wife [name] and scheduled a Palliative Care follow up appointment for 01/21/22 @ 12:30 PM.  ?

## 2022-01-18 ENCOUNTER — Telehealth: Payer: Self-pay

## 2022-01-18 NOTE — Telephone Encounter (Signed)
Fax sent over from from [facility] for Atorvastatin. Faxed back a denial to send to PCP ?

## 2022-01-20 ENCOUNTER — Telehealth: Payer: Self-pay | Admitting: Family Medicine

## 2022-01-20 NOTE — Telephone Encounter (Signed)
Manuela Schwartz an OT with [facility] called to get verbal orders for home health OT for one time a week for seven weeks. ? ? ? ? ? ?Good callback number is [phone] ?Okay to leave voicemail ?

## 2022-01-21 ENCOUNTER — Other Ambulatory Visit: Payer: Self-pay

## 2022-01-21 ENCOUNTER — Encounter: Payer: Self-pay | Admitting: Family Medicine

## 2022-01-21 ENCOUNTER — Other Ambulatory Visit: Payer: Medicare HMO | Admitting: Family Medicine

## 2022-01-21 VITALS — BP 98/56 | HR 89 | Temp 97.9°F | Resp 16

## 2022-01-21 DIAGNOSIS — Z789 Other specified health status: Secondary | ICD-10-CM

## 2022-01-21 DIAGNOSIS — Z66 Do not resuscitate: Secondary | ICD-10-CM

## 2022-01-21 DIAGNOSIS — E11649 Type 2 diabetes mellitus with hypoglycemia without coma: Secondary | ICD-10-CM | POA: Insufficient documentation

## 2022-01-21 HISTORY — DX: Do not resuscitate: Z66

## 2022-01-21 HISTORY — DX: Other specified health status: Z78.9

## 2022-01-21 NOTE — Progress Notes (Signed)
? ? ?Manufacturing engineer ?Community Palliative Care Consult Note ?Telephone: [phone]  ?Fax: [phone]  ? ? ?Date of encounter: 01/21/22 ?12:48 PM ?PATIENT NAME: Kevin [name]  "Kevin" ?[name] [address]   ?There are no phone numbers on file. ?DOB: [birth year] ?MRN: [id number] ?PRIMARY CARE PROVIDER:    ?[name], MD,  ?[address] ?[place] [address] ?[phone] ? ?REFERRING PROVIDER:   ?[name], MD ?[address] ?[place],   [address] ?[phone] ? ?RESPONSIBLE PARTY:    ?Contact Information   ? ? Name Relation Home Work Mobile  ? Killgore,[name] Spouse   [phone]  ? Cox,Cheryl Relative   [phone]  ? ?  ? ? ? ?I met face to face with patient and wife in their home. Palliative Care was asked to follow this patient by consultation request of  Burchette, Alinda Sierras, MD to address advance care planning and complex medical decision making. This is a follow up visit. ? ? ?ASSESSMENT, SYMPTOM MANAGEMENT AND PLAN / RECOMMENDATIONS:  ? Hypoglycemia with Type 2 DM ?Off hypoglycemic agents, diet controlled. ?Encouraged to eat small amounts of protein Q 2-3 hours while awake (advised Ensure, nuts, peanut butter, meat sandwich, beans, meat, does not tolerate milk) and to eat protein HS snack ?Educated wife on s/sx of hypoglycemia-confusion, jitteriness, nausea, difficulty arousing. ?Discuss at follow up with PCP next Tuesday, call if symptomatic prior. ?Question significant hypoglycemic unawareness ? ?2. 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 ?3.  Malnutrition of moderate degree ?Improving intake food and fluid, weight gain per wife-uncertain amount. ?Last Albumin normal at 3.5 on 01/07/22 up  from 3.1 on 11/11/21. ?Continue Mirtazapine 15 mg for appetite stimulation. ?Continue Ensure TID (may be able to obtain through [place]) ? ?4. Constipation, unspecified ?Local hematoma and bladder displacement in right side of pelvis. ?Imaging shows large fecal volume in ascending colon from different images. ?Recommend rectal exam for fecal impaction, encourage wife to use Miralax 17 gm in fluid of choice daily and continue water intake. Advised can decrease Miralax to every other day if having multiple loose stools in 1 day but that he may have loose stools while trying to relieve fecal retention. ?Advised to contact PCP if develops fever, severe abdominal pain, nausea/vomiting or is unable to pass gas/have BM for 2-3 days. ?Advised fecal retention could contribute to urinary retention as well and notify PCP if increased urinary frequency, weaker stream, difficulty initiating or dark urine. ?Recommend bladder scan in office to check urine volume to assess for urinary retention. ? ? ?Advance Care Planning/Goals of Care: Goals include to maximize quality of life and symptom management. Patient & health care surrogate gave their permission to discuss. ?Our advance care planning conversation included a discussion about:    ?The value and importance of advance care- no HC POA but wife is decision maker ?Exploration of personal, cultural or spiritual beliefs that might influence medical decisions -"I don't want to be kept alive just to die later, when it is my time to go, let me go." ?Exploration of goals of care in the event of a sudden injury or illness-DNR/DNI ?Identification of a healthcare agent-wife [name] ?Creation of an advance directive document -MOST completed today and signed by spouse. ?Decision not to resuscitate or to de-escalate disease focused  treatments due to poor prognosis. ?CODE STATUS: ?DNR ?MOST as of 01/21/22: ?DNR/DNI ?Comfort measures ?IV fluids and antibiotics on case by case, time limited basis ?No  feeding tube ? ? ? ?Follow up Palliative Care Visit: Palliative care will continue to follow for complex medical decision making, advance care planning, and clarification of goals. Return 3 weeks or prn. ? ? ?This visit was coded based on medical decision making (MDM). ? ?PPS: 50% ? ?HOSPICE ELIGIBILITY/DIAGNOSIS: TBD ? ?Chief Complaint:  ?Palliative Care is following for chronic medical management of dementia and failure to thrive. ? ?HISTORY OF PRESENT ILLNESS:  Kevin [name] " Kevin [name]" is a 76 y.o. 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 With the pelvic hematoma which had displaced his bladder he underwent pelvic angiogram and coil embolization of the suspected artery. He has prostate cancer which is being expectantly managed through Urology at the [facility].  His last total PSA was 9.0 on 02/12/2021. His last BX viewed was 01/19/16 with Gleason score 3+4=7, group 2 in the right mid zone.  ?On 01/07/22 pt admitted with profound hypotension, acute on chronic renal failure and failure to thrive.  Since he came out of the hospital appetite and fluid intake have improved, he was started on Mirtazapine 15 mg daily.  Wife states he has been on Wellbutrin "for years" and that she did not see any improvement in his mood even with an increase in his dose.   ?He reports recent pain with BM, no bright red/dark tarry stools, not having to strain but wife states he went to have a BM almost "30 times" yesterday.  He would get up and had to go back to the bathroom and have soft formed stools.  Denies fever, nausea or vomiting. Both are unsure if he is passing gas.  Review of imaging of abdomen, chest  or back over the last 2-3 months shows significant fecal retention and pt is not taking Miralax.   ?BS 188 per his freestyle libre while in the home but had a snack recently of Oreo cookies. His freestyle monitor shows 4 low events in the past 2 weeks between the hours of 12-6 am and he has been taken off all oral medications.  Does not routinely do HS snack.   ?Drinking Ensure, discussed protein supplement.  Has home care RN and PT through [facility]. 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 Incontinent of bowel and bladder intermittently. Has not been wandering, been told not to drive. 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  He does not recognize limitations imposed by recent hypotension or poor balance/self reported confusion as an issue. ? ? ?History obtained from review of EMR, discussion with wife and/or Mr. Campas.  ?I reviewed available labs, medications, imaging, studies and related documents from the EMR.  Records reviewed and summarized above.  ? ?Preferred Pharmacy:  [facility] on [address] ? ?ROS ? ?General: NAD, occasional dizziness, denies fever ?EYES: denies vision changes ?ENMT: denies dysphagia ?Cardiovascular: denies chest pain, denies DOE ?Pulmonary: denies  cough, denies increased SOB ?Abdomen: endorses good appetite (improved since most recent hospitalization), denies constipation, endorses continence of bowel ?GU: denies dysuria, endorses continence of urine ?MSK:  denies increased weakness, no falls reported since December ?Skin: denies rashes or wounds ?Neurological: denies pain, denies insomnia ?Psych: Endorses depressed mood ?Heme/lymph/immuno: denies bruises, abnormal bleeding ? ?Physical Exam: ?Current and past weights: On 04/02/21 at [facility] weight was 162.3 lbs, on 01/08/22 weight was 117 lbs ?Constitutional: NAD ?General: frail appearing,  thin  ?EYES: anicteric sclera, lids intact, no discharge  ?ENMT: intact hearing, oral mucous membranes moist, dentition in varying stages of repair ?CV: S1S2, RRR, no LE edema ?Pulmonary: CTAB, no increased

## 2022-01-22 NOTE — Telephone Encounter (Signed)
Left a detailed message on Kevin [name]'s confidential vm informing her of ok for verbal orders.  ?

## 2022-01-26 ENCOUNTER — Inpatient Hospital Stay: Payer: Medicare HMO | Admitting: Family Medicine

## 2022-02-01 ENCOUNTER — Telehealth: Payer: Self-pay | Admitting: Family Medicine

## 2022-02-01 NOTE — Telephone Encounter (Signed)
Kevin [name] with [facility] is calling in verbal orders for the patient. The verbal orders are PT for 1 week 1, 2 week 4, and 1 week 4 beginning 03/24 for gait training, balance and strengthening. ? ?[name] could be contacted at [phone]. ? ?Please advise. ?

## 2022-02-01 NOTE — Telephone Encounter (Signed)
Kevin [name] has been given verbal orders.  ?

## 2022-02-11 ENCOUNTER — Other Ambulatory Visit: Payer: Medicare HMO | Admitting: Family Medicine

## 2022-02-11 VITALS — BP 108/62 | HR 78 | Temp 97.8°F | Resp 18

## 2022-02-11 DIAGNOSIS — Z636 Dependent relative needing care at home: Secondary | ICD-10-CM

## 2022-02-11 DIAGNOSIS — Z515 Encounter for palliative care: Secondary | ICD-10-CM

## 2022-02-11 DIAGNOSIS — E11649 Type 2 diabetes mellitus with hypoglycemia without coma: Secondary | ICD-10-CM

## 2022-02-11 DIAGNOSIS — R69 Illness, unspecified: Secondary | ICD-10-CM | POA: Diagnosis not present

## 2022-02-11 NOTE — Progress Notes (Signed)
? ? ?Manufacturing engineer ?Community Palliative Care Consult Note ?Telephone: [phone]  ?Fax: [phone]  ? ? ?Date of encounter: 02/11/22 ?12:47 PM ?PATIENT NAME: Kevin [name] ?Kevin [name] [id number]   ?[phone] (home)  ?DOB: 1946-08-17 ?MRN: [id number] ?PRIMARY CARE PROVIDER:    ?Kevin Post, MD,  ?[address] ?[place] [address] ?[phone] ? ?REFERRING PROVIDER:   ?Kevin Post, MD ?[address] ?[place],  [place] [address] ?[phone] ? ?RESPONSIBLE PARTY:    ?Contact Information   ? ? Name Relation Home Work Mobile  ? Kevin [name] Spouse   [phone]  ? Kevin [name] Relative   [phone]  ? ?  ? ? ? ?I met face to face with patient and wife in their home. Palliative Care was asked to follow this patient by consultation request of  Burchette, Kevin Sierras, MD to address advance care planning and complex medical decision making. This is a follow up visit. ? ?ASSESSMENT, SYMPTOM MANAGEMENT AND PLAN / RECOMMENDATIONS:  ?Caregiver stress/Recurrent major depression/dementia ?Continues on Remeron. ?Advised pt/caregiver to consider day program at [name], will have SW call wife to see if he can get into the program and/or there is any assistance to help with costs. ? ?2.  Hypoglycemia associated with Type 2 DM ?Blood sugars more stable. ?Continue with Freestyle Libre CGM, no noted hypoglycemia. ?Continue current regimen with ACHS. ? ?3.  Palliative Care Encounter ?Have SW identify resources for possible respite care, if needed. ? ? ?Advance Care Planning/Goals of Care:  ? ?CODE STATUS: ?DNR ?MOST as of 01/21/22: ?DNR/DNI ?Comfort measures ?IV fluids and antibiotics on case by case, time limited basis ?No feeding tube ? ? ?Follow up Palliative Care Visit: Palliative care will continue to follow for complex medical decision making, advance care planning, and clarification of goals. Return 4 weeks or prn. ? ? ? ?This visit was coded based on  medical decision making (MDM). ? ?PPS: 50% ? ?HOSPICE ELIGIBILITY/DIAGNOSIS: TBD ? ?Chief Complaint:  ?Palliative Care is continuing to follow patient for chronic medical management of dementia complicated by DM, heart failure, SDH and hx of prostate cancer. ? ?HISTORY OF PRESENT ILLNESS:  Kevin [name] is a 76 y.o. year old male with dementia, CKD stage 3, hx of prostate cancer, anemia, hx of Agent Orange Exposure, urinary retention with incomplete bladder emptying, constipation, protein calorie malnutrition, non-ischemic cardiomyopathy, HTN, chronic LV dysfunction, combined systolic and diastolic heart failure with implanted AICD, Type 2 DM and hx of SDH and pelvic hematoma.  He also has a hx of fracture of pubic rami.  Pt states "I feel great today.  I am going to start walking in my neighborhood every day and as soon as I find something I am going to go back to work about 20 hours per week."  Initially he denied falls but when wife reminded him he became exasperated and acknowledged he did fall once getting out of the shower.  He eats Kevin. Denies CP, SOB, nausea, vomiting, diarrhea or constipation.  Sleeping ok.  With any correction of the details by his wife or comment of hers indicating he could not do something or wasn't ready to do something and pt became very agitated and irritable stating he was "bored watching tv and I want to get back to work. I don't need someone telling me what to do."  Wife indicates he overestimates his abilities and has been told not to drive. ? ?History obtained from review of EMR, discussion  with family,  and/or Kevin [name].  ?I reviewed available labs, medications, imaging, studies and related documents from the EMR.  Records reviewed and summarized above.  ? ?ROS ? ?General: NAD ?EYES: denies vision changes ?ENMT: denies dysphagia ?Cardiovascular: denies chest pain, denies DOE ?Pulmonary: denies cough, denies increased SOB ?Abdomen: endorses good appetite, denies  constipation, endorses continence of bowel ?GU: denies dysuria, endorses continence of urine ?MSK:  denies increased weakness,  no falls reported ?Skin: denies rashes or wounds ?Neurological: denies pain, denies insomnia ?Psych: Endorses positive mood ?Heme/lymph/immuno: denies bruises, abnormal bleeding ? ?Physical Exam: ?Current and past weights: weight 140 lbs ?Constitutional: NAD ?General: frail appearing, thin ?EYES: anicteric sclera, lids intact, no discharge  ?ENMT: intact hearing, oral mucous membranes moist, dentition intact ?CV: S1S2, RRR, no LE edema ?Pulmonary: CTAB, no increased work of breathing, no cough, room air ?Abdomen: normo-active BS + 4 quadrants, soft and non tender, no ascites ?GU: deferred ?MSK: noted sarcopenia of BLE, moves all extremities, ambulatory with rolling walker ?Skin: warm and dry, no rashes or wounds on visible skin ?Neuro:  no generalized weakness, short term memory loss ?Psych: irritable, A and O x 3 ?Hem/lymph/immuno: no widespread bruising ? ? ?Thank you for the opportunity to participate in the care of Kevin [name].  The palliative care team will continue to follow. Please call our office at [phone] if we can be of additional assistance.  ? ?Kevin Conception, FNP -C ? ?COVID-19 PATIENT SCREENING TOOL ?Asked and negative response unless otherwise noted:  ? ?Have you had symptoms of covid, tested positive or been in contact with someone with symptoms/positive test in the past 5-10 days?  No ?

## 2022-02-12 ENCOUNTER — Encounter: Payer: Self-pay | Admitting: Family Medicine

## 2022-02-12 DIAGNOSIS — Z636 Dependent relative needing care at home: Secondary | ICD-10-CM | POA: Insufficient documentation

## 2022-03-02 ENCOUNTER — Ambulatory Visit (INDEPENDENT_AMBULATORY_CARE_PROVIDER_SITE_OTHER): Payer: Medicare HMO

## 2022-03-02 DIAGNOSIS — I495 Sick sinus syndrome: Secondary | ICD-10-CM

## 2022-03-02 LAB — CUP PACEART REMOTE DEVICE CHECK
Battery Remaining Longevity: 48 mo
Battery Voltage: 2.97 V
Brady Statistic AP VP Percent: 0.07 %
Brady Statistic AP VS Percent: 0.01 %
Brady Statistic AS VP Percent: 99.87 %
Brady Statistic AS VS Percent: 0.05 %
Brady Statistic RA Percent Paced: 0.08 %
Brady Statistic RV Percent Paced: 99.89 %
Date Time Interrogation Session: 20230425031704
HighPow Impedance: 60 Ohm
Implantable Lead Implant Date: 20130123
Implantable Lead Implant Date: 20160316
Implantable Lead Implant Date: 20160316
Implantable Lead Location: 753858
Implantable Lead Location: 753859
Implantable Lead Location: 753860
Implantable Lead Model: 4598
Implantable Lead Model: 5076
Implantable Lead Model: 6935
Implantable Pulse Generator Implant Date: 20211021
Lead Channel Impedance Value: 184.154
Lead Channel Impedance Value: 188.1 Ohm
Lead Channel Impedance Value: 205.2 Ohm
Lead Channel Impedance Value: 224.438
Lead Channel Impedance Value: 230.327
Lead Channel Impedance Value: 304 Ohm
Lead Channel Impedance Value: 342 Ohm
Lead Channel Impedance Value: 342 Ohm
Lead Channel Impedance Value: 342 Ohm
Lead Channel Impedance Value: 399 Ohm
Lead Channel Impedance Value: 418 Ohm
Lead Channel Impedance Value: 513 Ohm
Lead Channel Impedance Value: 513 Ohm
Lead Channel Impedance Value: 646 Ohm
Lead Channel Impedance Value: 646 Ohm
Lead Channel Impedance Value: 817 Ohm
Lead Channel Impedance Value: 817 Ohm
Lead Channel Impedance Value: 836 Ohm
Lead Channel Pacing Threshold Amplitude: 0.5 V
Lead Channel Pacing Threshold Amplitude: 0.5 V
Lead Channel Pacing Threshold Amplitude: 1.875 V
Lead Channel Pacing Threshold Pulse Width: 0.4 ms
Lead Channel Pacing Threshold Pulse Width: 0.4 ms
Lead Channel Pacing Threshold Pulse Width: 1 ms
Lead Channel Sensing Intrinsic Amplitude: 2.625 mV
Lead Channel Sensing Intrinsic Amplitude: 2.625 mV
Lead Channel Sensing Intrinsic Amplitude: 6 mV
Lead Channel Sensing Intrinsic Amplitude: 6 mV
Lead Channel Setting Pacing Amplitude: 1.5 V
Lead Channel Setting Pacing Amplitude: 2.5 V
Lead Channel Setting Pacing Amplitude: 2.5 V
Lead Channel Setting Pacing Pulse Width: 0.4 ms
Lead Channel Setting Pacing Pulse Width: 1 ms
Lead Channel Setting Sensing Sensitivity: 0.3 mV

## 2022-03-09 ENCOUNTER — Emergency Department (HOSPITAL_COMMUNITY): Payer: No Typology Code available for payment source

## 2022-03-09 ENCOUNTER — Other Ambulatory Visit: Payer: Self-pay

## 2022-03-09 ENCOUNTER — Inpatient Hospital Stay (HOSPITAL_COMMUNITY)
Admission: EM | Admit: 2022-03-09 | Discharge: 2022-03-16 | DRG: 521 | Disposition: A | Discharge status: Rehabilitation Facility | Payer: No Typology Code available for payment source | Attending: Internal Medicine | Admitting: Internal Medicine

## 2022-03-09 ENCOUNTER — Encounter (HOSPITAL_COMMUNITY): Payer: Self-pay

## 2022-03-09 DIAGNOSIS — I428 Other cardiomyopathies: Secondary | ICD-10-CM | POA: Diagnosis present

## 2022-03-09 DIAGNOSIS — E8809 Other disorders of plasma-protein metabolism, not elsewhere classified: Secondary | ICD-10-CM | POA: Diagnosis present

## 2022-03-09 DIAGNOSIS — E1122 Type 2 diabetes mellitus with diabetic chronic kidney disease: Secondary | ICD-10-CM | POA: Diagnosis present

## 2022-03-09 DIAGNOSIS — S72032A Displaced midcervical fracture of left femur, initial encounter for closed fracture: Secondary | ICD-10-CM | POA: Diagnosis not present

## 2022-03-09 DIAGNOSIS — S72001S Fracture of unspecified part of neck of right femur, sequela: Secondary | ICD-10-CM | POA: Diagnosis not present

## 2022-03-09 DIAGNOSIS — Z87891 Personal history of nicotine dependence: Secondary | ICD-10-CM | POA: Diagnosis not present

## 2022-03-09 DIAGNOSIS — D72828 Other elevated white blood cell count: Secondary | ICD-10-CM | POA: Diagnosis present

## 2022-03-09 DIAGNOSIS — Z043 Encounter for examination and observation following other accident: Secondary | ICD-10-CM | POA: Diagnosis not present

## 2022-03-09 DIAGNOSIS — F32A Depression, unspecified: Secondary | ICD-10-CM | POA: Diagnosis not present

## 2022-03-09 DIAGNOSIS — Z79899 Other long term (current) drug therapy: Secondary | ICD-10-CM

## 2022-03-09 DIAGNOSIS — F0393 Unspecified dementia, unspecified severity, with mood disturbance: Secondary | ICD-10-CM | POA: Diagnosis present

## 2022-03-09 DIAGNOSIS — F0390 Unspecified dementia without behavioral disturbance: Secondary | ICD-10-CM | POA: Diagnosis present

## 2022-03-09 DIAGNOSIS — W19XXXA Unspecified fall, initial encounter: Secondary | ICD-10-CM | POA: Diagnosis not present

## 2022-03-09 DIAGNOSIS — Z8673 Personal history of transient ischemic attack (TIA), and cerebral infarction without residual deficits: Secondary | ICD-10-CM | POA: Diagnosis not present

## 2022-03-09 DIAGNOSIS — E1169 Type 2 diabetes mellitus with other specified complication: Secondary | ICD-10-CM

## 2022-03-09 DIAGNOSIS — Z681 Body mass index (BMI) 19 or less, adult: Secondary | ICD-10-CM

## 2022-03-09 DIAGNOSIS — I5041 Acute combined systolic (congestive) and diastolic (congestive) heart failure: Secondary | ICD-10-CM | POA: Diagnosis not present

## 2022-03-09 DIAGNOSIS — Z66 Do not resuscitate: Secondary | ICD-10-CM | POA: Diagnosis present

## 2022-03-09 DIAGNOSIS — Z8546 Personal history of malignant neoplasm of prostate: Secondary | ICD-10-CM

## 2022-03-09 DIAGNOSIS — R69 Illness, unspecified: Secondary | ICD-10-CM | POA: Diagnosis not present

## 2022-03-09 DIAGNOSIS — S72042A Displaced fracture of base of neck of left femur, initial encounter for closed fracture: Secondary | ICD-10-CM | POA: Diagnosis present

## 2022-03-09 DIAGNOSIS — Z85828 Personal history of other malignant neoplasm of skin: Secondary | ICD-10-CM

## 2022-03-09 DIAGNOSIS — Z882 Allergy status to sulfonamides status: Secondary | ICD-10-CM

## 2022-03-09 DIAGNOSIS — S42202D Unspecified fracture of upper end of left humerus, subsequent encounter for fracture with routine healing: Secondary | ICD-10-CM | POA: Diagnosis not present

## 2022-03-09 DIAGNOSIS — S72001A Fracture of unspecified part of neck of right femur, initial encounter for closed fracture: Secondary | ICD-10-CM | POA: Diagnosis not present

## 2022-03-09 DIAGNOSIS — Z8249 Family history of ischemic heart disease and other diseases of the circulatory system: Secondary | ICD-10-CM

## 2022-03-09 DIAGNOSIS — M25512 Pain in left shoulder: Secondary | ICD-10-CM | POA: Diagnosis not present

## 2022-03-09 DIAGNOSIS — S0990XA Unspecified injury of head, initial encounter: Secondary | ICD-10-CM | POA: Diagnosis not present

## 2022-03-09 DIAGNOSIS — I5042 Chronic combined systolic (congestive) and diastolic (congestive) heart failure: Secondary | ICD-10-CM | POA: Diagnosis present

## 2022-03-09 DIAGNOSIS — D6959 Other secondary thrombocytopenia: Secondary | ICD-10-CM | POA: Diagnosis present

## 2022-03-09 DIAGNOSIS — S72002A Fracture of unspecified part of neck of left femur, initial encounter for closed fracture: Secondary | ICD-10-CM | POA: Diagnosis not present

## 2022-03-09 DIAGNOSIS — K5900 Constipation, unspecified: Secondary | ICD-10-CM | POA: Diagnosis not present

## 2022-03-09 DIAGNOSIS — Y92008 Other place in unspecified non-institutional (private) residence as the place of occurrence of the external cause: Secondary | ICD-10-CM | POA: Diagnosis not present

## 2022-03-09 DIAGNOSIS — Z20822 Contact with and (suspected) exposure to covid-19: Secondary | ICD-10-CM | POA: Diagnosis present

## 2022-03-09 DIAGNOSIS — S42292A Other displaced fracture of upper end of left humerus, initial encounter for closed fracture: Secondary | ICD-10-CM | POA: Diagnosis not present

## 2022-03-09 DIAGNOSIS — N390 Urinary tract infection, site not specified: Secondary | ICD-10-CM | POA: Diagnosis not present

## 2022-03-09 DIAGNOSIS — M7989 Other specified soft tissue disorders: Secondary | ICD-10-CM | POA: Diagnosis not present

## 2022-03-09 DIAGNOSIS — F03918 Unspecified dementia, unspecified severity, with other behavioral disturbance: Secondary | ICD-10-CM | POA: Diagnosis present

## 2022-03-09 DIAGNOSIS — Z471 Aftercare following joint replacement surgery: Secondary | ICD-10-CM | POA: Diagnosis not present

## 2022-03-09 DIAGNOSIS — N179 Acute kidney failure, unspecified: Secondary | ICD-10-CM | POA: Diagnosis present

## 2022-03-09 DIAGNOSIS — I110 Hypertensive heart disease with heart failure: Secondary | ICD-10-CM | POA: Diagnosis not present

## 2022-03-09 DIAGNOSIS — I495 Sick sinus syndrome: Secondary | ICD-10-CM | POA: Diagnosis present

## 2022-03-09 DIAGNOSIS — S72002S Fracture of unspecified part of neck of left femur, sequela: Secondary | ICD-10-CM | POA: Diagnosis not present

## 2022-03-09 DIAGNOSIS — Z96642 Presence of left artificial hip joint: Secondary | ICD-10-CM | POA: Diagnosis not present

## 2022-03-09 DIAGNOSIS — M19012 Primary osteoarthritis, left shoulder: Secondary | ICD-10-CM | POA: Diagnosis not present

## 2022-03-09 DIAGNOSIS — E119 Type 2 diabetes mellitus without complications: Secondary | ICD-10-CM

## 2022-03-09 DIAGNOSIS — S42232A 3-part fracture of surgical neck of left humerus, initial encounter for closed fracture: Secondary | ICD-10-CM | POA: Diagnosis not present

## 2022-03-09 DIAGNOSIS — M79603 Pain in arm, unspecified: Secondary | ICD-10-CM | POA: Diagnosis not present

## 2022-03-09 DIAGNOSIS — S42202A Unspecified fracture of upper end of left humerus, initial encounter for closed fracture: Secondary | ICD-10-CM | POA: Diagnosis not present

## 2022-03-09 DIAGNOSIS — S199XXA Unspecified injury of neck, initial encounter: Secondary | ICD-10-CM | POA: Diagnosis not present

## 2022-03-09 DIAGNOSIS — D62 Acute posthemorrhagic anemia: Secondary | ICD-10-CM | POA: Diagnosis not present

## 2022-03-09 DIAGNOSIS — S42212A Unspecified displaced fracture of surgical neck of left humerus, initial encounter for closed fracture: Secondary | ICD-10-CM | POA: Diagnosis present

## 2022-03-09 DIAGNOSIS — R339 Retention of urine, unspecified: Secondary | ICD-10-CM | POA: Diagnosis not present

## 2022-03-09 DIAGNOSIS — Z95810 Presence of automatic (implantable) cardiac defibrillator: Secondary | ICD-10-CM

## 2022-03-09 DIAGNOSIS — G4700 Insomnia, unspecified: Secondary | ICD-10-CM | POA: Diagnosis present

## 2022-03-09 DIAGNOSIS — Y92009 Unspecified place in unspecified non-institutional (private) residence as the place of occurrence of the external cause: Secondary | ICD-10-CM

## 2022-03-09 DIAGNOSIS — S72009A Fracture of unspecified part of neck of unspecified femur, initial encounter for closed fracture: Secondary | ICD-10-CM

## 2022-03-09 DIAGNOSIS — E43 Unspecified severe protein-calorie malnutrition: Secondary | ICD-10-CM | POA: Diagnosis present

## 2022-03-09 DIAGNOSIS — Z806 Family history of leukemia: Secondary | ICD-10-CM

## 2022-03-09 DIAGNOSIS — I130 Hypertensive heart and chronic kidney disease with heart failure and stage 1 through stage 4 chronic kidney disease, or unspecified chronic kidney disease: Secondary | ICD-10-CM | POA: Diagnosis present

## 2022-03-09 DIAGNOSIS — E7800 Pure hypercholesterolemia, unspecified: Secondary | ICD-10-CM | POA: Diagnosis present

## 2022-03-09 DIAGNOSIS — Z8041 Family history of malignant neoplasm of ovary: Secondary | ICD-10-CM

## 2022-03-09 DIAGNOSIS — Z7902 Long term (current) use of antithrombotics/antiplatelets: Secondary | ICD-10-CM

## 2022-03-09 DIAGNOSIS — W010XXA Fall on same level from slipping, tripping and stumbling without subsequent striking against object, initial encounter: Secondary | ICD-10-CM | POA: Diagnosis present

## 2022-03-09 DIAGNOSIS — N1831 Chronic kidney disease, stage 3a: Secondary | ICD-10-CM | POA: Diagnosis present

## 2022-03-09 DIAGNOSIS — D539 Nutritional anemia, unspecified: Secondary | ICD-10-CM | POA: Diagnosis present

## 2022-03-09 DIAGNOSIS — I509 Heart failure, unspecified: Secondary | ICD-10-CM | POA: Diagnosis not present

## 2022-03-09 DIAGNOSIS — E875 Hyperkalemia: Secondary | ICD-10-CM | POA: Diagnosis present

## 2022-03-09 DIAGNOSIS — A499 Bacterial infection, unspecified: Secondary | ICD-10-CM | POA: Diagnosis not present

## 2022-03-09 LAB — I-STAT CHEM 8, ED
BUN: 20 mg/dL (ref 8–23)
Calcium, Ion: 1.13 mmol/L — ABNORMAL LOW (ref 1.15–1.40)
Chloride: 105 mmol/L (ref 98–111)
Creatinine, Ser: 1.4 mg/dL — ABNORMAL HIGH (ref 0.61–1.24)
Glucose, Bld: 174 mg/dL — ABNORMAL HIGH (ref 70–99)
HCT: 36 % — ABNORMAL LOW (ref 39.0–52.0)
Hemoglobin: 12.2 g/dL — ABNORMAL LOW (ref 13.0–17.0)
Potassium: 4.2 mmol/L (ref 3.5–5.1)
Sodium: 142 mmol/L (ref 135–145)
TCO2: 26 mmol/L (ref 22–32)

## 2022-03-09 LAB — CBC WITH DIFFERENTIAL/PLATELET
Abs Immature Granulocytes: 0.07 10*3/uL (ref 0.00–0.07)
Basophils Absolute: 0 10*3/uL (ref 0.0–0.1)
Basophils Relative: 0 %
Eosinophils Absolute: 0.1 10*3/uL (ref 0.0–0.5)
Eosinophils Relative: 1 %
HCT: 38.7 % — ABNORMAL LOW (ref 39.0–52.0)
Hemoglobin: 12.1 g/dL — ABNORMAL LOW (ref 13.0–17.0)
Immature Granulocytes: 1 %
Lymphocytes Relative: 12 %
Lymphs Abs: 1.2 10*3/uL (ref 0.7–4.0)
MCH: 33.5 pg (ref 26.0–34.0)
MCHC: 31.3 g/dL (ref 30.0–36.0)
MCV: 107.2 fL — ABNORMAL HIGH (ref 80.0–100.0)
Monocytes Absolute: 0.7 10*3/uL (ref 0.1–1.0)
Monocytes Relative: 6 %
Neutro Abs: 8.2 10*3/uL — ABNORMAL HIGH (ref 1.7–7.7)
Neutrophils Relative %: 80 %
Platelets: 179 10*3/uL (ref 150–400)
RBC: 3.61 MIL/uL — ABNORMAL LOW (ref 4.22–5.81)
RDW: 13.2 % (ref 11.5–15.5)
WBC: 10.2 10*3/uL (ref 4.0–10.5)
nRBC: 0 % (ref 0.0–0.2)

## 2022-03-09 LAB — BASIC METABOLIC PANEL
Anion gap: 7 (ref 5–15)
BUN: 16 mg/dL (ref 8–23)
CO2: 25 mmol/L (ref 22–32)
Calcium: 9.1 mg/dL (ref 8.9–10.3)
Chloride: 109 mmol/L (ref 98–111)
Creatinine, Ser: 1.45 mg/dL — ABNORMAL HIGH (ref 0.61–1.24)
GFR, Estimated: 50 mL/min — ABNORMAL LOW (ref >60)
Glucose, Bld: 180 mg/dL — ABNORMAL HIGH (ref 70–99)
Potassium: 4.3 mmol/L (ref 3.5–5.1)
Sodium: 141 mmol/L (ref 135–145)

## 2022-03-09 LAB — RESP PANEL BY RT-PCR (FLU A&B, COVID) ARPGX2
Influenza A by PCR: NEGATIVE
Influenza B by PCR: NEGATIVE
SARS Coronavirus 2 by RT PCR: NEGATIVE

## 2022-03-09 LAB — LACTIC ACID, PLASMA: Lactic Acid, Venous: 1.5 mmol/L (ref 0.5–1.9)

## 2022-03-09 LAB — PROTIME-INR
INR: 1 (ref 0.8–1.2)
Prothrombin Time: 12.9 seconds (ref 11.4–15.2)

## 2022-03-09 LAB — ETHANOL: Alcohol, Ethyl (B): <10 mg/dL (ref <10)

## 2022-03-09 LAB — CBG MONITORING, ED: Glucose-Capillary: 155 mg/dL — ABNORMAL HIGH (ref 70–99)

## 2022-03-09 LAB — TROPONIN I (HIGH SENSITIVITY): Troponin I (High Sensitivity): 12 ng/L (ref <18)

## 2022-03-09 MED ORDER — MORPHINE SULFATE (PF) 2 MG/ML IV SOLN
0.5000 mg | INTRAVENOUS | Status: DC | PRN
  Administered 2022-03-10 – 2022-03-12 (×7): 0.5 mg via INTRAVENOUS
  Filled 2022-03-09 (×7): qty 1

## 2022-03-09 MED ORDER — MORPHINE SULFATE (PF) 4 MG/ML IV SOLN
4.0000 mg | INTRAVENOUS | Status: DC | PRN
  Administered 2022-03-09: 4 mg via INTRAVENOUS
  Filled 2022-03-09: qty 1

## 2022-03-09 MED ORDER — MIRTAZAPINE 15 MG PO TBDP
15.0000 mg | ORAL_TABLET | Freq: Every day | ORAL | Status: DC
  Administered 2022-03-09 – 2022-03-15 (×7): 15 mg via ORAL
  Filled 2022-03-09 (×10): qty 1

## 2022-03-09 MED ORDER — SODIUM CHLORIDE 0.9 % IV SOLN: INTRAVENOUS | Status: DC

## 2022-03-09 MED ORDER — SODIUM CHLORIDE 0.9 % IV BOLUS
1000.0000 mL | Freq: Once | INTRAVENOUS | Status: AC
  Administered 2022-03-09: 1000 mL via INTRAVENOUS

## 2022-03-09 MED ORDER — HYDROMORPHONE HCL 1 MG/ML IJ SOLN
1.0000 mg | Freq: Once | INTRAMUSCULAR | Status: AC
  Administered 2022-03-09: 1 mg via INTRAVENOUS
  Filled 2022-03-09: qty 1

## 2022-03-09 MED ORDER — ONDANSETRON HCL 4 MG/2ML IJ SOLN: 4.0000 mg | Freq: Once | INTRAMUSCULAR | Status: DC

## 2022-03-09 MED ORDER — INSULIN ASPART 100 UNIT/ML IJ SOLN
0.0000 [IU] | INTRAMUSCULAR | Status: DC
  Administered 2022-03-09 – 2022-03-10 (×3): 2 [IU] via SUBCUTANEOUS
  Administered 2022-03-11: 5 [IU] via SUBCUTANEOUS
  Administered 2022-03-11: 1 [IU] via SUBCUTANEOUS
  Administered 2022-03-11: 2 [IU] via SUBCUTANEOUS
  Administered 2022-03-12: 3 [IU] via SUBCUTANEOUS
  Administered 2022-03-12: 1 [IU] via SUBCUTANEOUS
  Administered 2022-03-12: 7 [IU] via SUBCUTANEOUS
  Administered 2022-03-12 (×2): 2 [IU] via SUBCUTANEOUS
  Administered 2022-03-12 – 2022-03-13 (×3): 1 [IU] via SUBCUTANEOUS
  Administered 2022-03-13: 2 [IU] via SUBCUTANEOUS
  Administered 2022-03-13 (×2): 1 [IU] via SUBCUTANEOUS
  Administered 2022-03-14 (×2): 2 [IU] via SUBCUTANEOUS
  Administered 2022-03-14 – 2022-03-15 (×2): 1 [IU] via SUBCUTANEOUS
  Administered 2022-03-15: 2 [IU] via SUBCUTANEOUS
  Administered 2022-03-15 – 2022-03-16 (×6): 1 [IU] via SUBCUTANEOUS

## 2022-03-09 NOTE — ED Provider Notes (Signed)
?[place] ?Provider Note ? ? ?CSN: [id number] ?Arrival date & time: 03/09/22  2120 ? ?  ? ?History ? ?Chief Complaint  ?Patient presents with  ? Fall  ? ? ?Kevin [name] is a 76 y.o. male. ? ?The history is provided by the patient, the spouse, the EMS personnel and medical records. No language interpreter was used.  ?Fall ? ? ?76 year old male with significant history of diabetes, dementia, hypercholesterolemia, has pacemaker, currently on Plavix, brought here via EMS from home for evaluation of a fall.  Patient normally use a walker to walk.  He was outside in his garage tonight without his walker and actually tripped over a trash can and fell.  He did hit his head but denies any loss of consciousness.  He is complaining of pain to his left shoulder and left hip.  He denies any precipitating symptoms prior to the fall.  Does not complain of any significant headache, neck pain, chest pain, trouble breathing, abdominal pain, dysuria, focal numbness or focal weakness.  Does have history of prior stroke.  He is accompanied by his wife.  He did receive fentanyl prior to arrival by EMS.  His pain is currently moderate in severity.  He is right-hand dominant.  He believes he is up-to-date with tetanus. ? ?Home Medications ?Prior to Admission medications   ?Medication Sig Start Date End Date Taking? Authorizing Provider  ?acetaminophen (TYLENOL) 325 MG tablet Take 1-2 tablets (325-650 mg total) by mouth every 4 (four) hours as needed for mild pain. 11/19/21   Angiulli, Lavon Paganini, PA-C  ?atorvastatin (LIPITOR) 80 MG tablet Take 0.5 tablets (40 mg total) by mouth every evening. 11/19/21   Angiulli, Lavon Paganini, PA-C  ?buPROPion (WELLBUTRIN XL) 150 MG 24 hr tablet Take 2 tablets in AM, 1 tablet in PM ?Patient taking differently: Take 150 mg by mouth daily. 09/10/21   Cameron Sprang, MD  ?Carboxymethylcellulose Sodium (EYE DROPS OP) Place 1 drop into both eyes 3 (three) times daily. Given  by [facility] doctor to help astigmtism    [provider]  ?cholecalciferol (VITAMIN D) 25 MCG (1000 UNIT) tablet Take 1 tablet (1,000 Units total) by mouth daily. 11/19/21   Angiulli, Lavon Paganini, PA-C  ?Continuous Blood Gluc Sensor (FREESTYLE LIBRE 14 DAY SENSOR) MISC SMARTSIG:1 Each Topical Every 2 Weeks 11/30/21   [provider]  ?donepezil (ARICEPT) 5 MG tablet Take 1 tablet (5 mg total) by mouth daily. 09/10/21   Cameron Sprang, MD  ?loratadine (CLARITIN) 10 MG tablet Take 1 tablet (10 mg total) by mouth daily. ?Patient taking differently: Take 10 mg by mouth daily as needed for rhinitis. 11/19/21   Angiulli, Lavon Paganini, PA-C  ?magnesium gluconate (MAGONATE) 500 MG tablet Take 0.5 tablets (250 mg total) by mouth daily. 11/19/21   Angiulli, Lavon Paganini, PA-C  ?memantine (NAMENDA) 10 MG tablet Take 1 tablet twice a day ?Patient taking differently: Take 10 mg by mouth 2 (two) times daily. 09/10/21   Cameron Sprang, MD  ?mirtazapine (REMERON SOL-TAB) 15 MG disintegrating tablet Take 1 tablet (15 mg total) by mouth at bedtime. ?Patient taking differently: Take 15 mg by mouth at bedtime. For appetite 01/04/22   Burchette, Alinda Sierras, MD  ?polyethylene glycol (MIRALAX / GLYCOLAX) 17 g packet Take 17 g by mouth daily as needed for mild constipation. 11/19/21   Angiulli, Lavon Paganini, PA-C  ?   ? ?Allergies    ?Sulfa antibiotics and 5-alpha reductase inhibitors   ? ?  Review of Systems   ?Review of Systems  ?All other systems reviewed and are negative. ? ?Physical Exam ?Updated Vital Signs ?BP (!) 152/80 (BP Location: Right Arm)   Pulse 88   Temp 98.3 ?F (36.8 ?C) (Oral)   Resp 20   Ht '6\' 4"'$  (1.93 m)   SpO2 98%   BMI 14.24 kg/m?  ?Physical Exam ?Vitals and nursing note reviewed.  ?Constitutional:   ?   General: He is not in acute distress. ?   Appearance: He is well-developed.  ?   Comments: Elderly male laying in bed appears mildly uncomfortable  ?HENT:  ?   Head: Normocephalic and atraumatic.  ?Eyes:  ?   Extraocular  Movements: Extraocular movements intact.  ?   Conjunctiva/sclera: Conjunctivae normal.  ?   Pupils: Pupils are equal, round, and reactive to light.  ?Neck:  ?   Comments: No cervical midline spine tenderness ?Cardiovascular:  ?   Rate and Rhythm: Normal rate and regular rhythm.  ?   Pulses: Normal pulses.  ?   Heart sounds: Normal heart sounds.  ?Pulmonary:  ?   Effort: Pulmonary effort is normal.  ?   Breath sounds: Normal breath sounds.  ?Abdominal:  ?   Palpations: Abdomen is soft.  ?Musculoskeletal:     ?   General: Deformity (Left shoulder: Moderate edema noted about the shoulder with decreased range of motion and obvious closed deformity noted.  Sensation intact) and signs of injury (L hip: ttp about the hip, and L leg is shorten and externally rotated.) present.  ?   Cervical back: Normal range of motion and neck supple. No tenderness.  ?Skin: ?   Capillary Refill: Capillary refill takes less than 2 seconds.  ?   Findings: No rash.  ?   Comments: L hand: skin tear noted to dorsum of L hand near hypothenar eminence  ?Neurological:  ?   Mental Status: He is alert. Mental status is at baseline.  ? ? ?ED Results / Procedures / Treatments   ?Labs ?(all labs ordered are listed, but only abnormal results are displayed) ?Labs Reviewed  ?BASIC METABOLIC PANEL - Abnormal; Notable for the following components:  ?    Result Value  ? Glucose, Bld 180 (*)   ? Creatinine, Ser 1.45 (*)   ? GFR, Estimated 50 (*)   ? All other components within normal limits  ?CBC WITH DIFFERENTIAL/PLATELET - Abnormal; Notable for the following components:  ? RBC 3.61 (*)   ? Hemoglobin 12.1 (*)   ? HCT 38.7 (*)   ? MCV 107.2 (*)   ? Neutro Abs 8.2 (*)   ? All other components within normal limits  ?I-STAT CHEM 8, ED - Abnormal; Notable for the following components:  ? Creatinine, Ser 1.40 (*)   ? Glucose, Bld 174 (*)   ? Calcium, Ion 1.13 (*)   ? Hemoglobin 12.2 (*)   ? HCT 36.0 (*)   ? All other components within normal limits  ?RESP PANEL  BY RT-PCR (FLU A&B, COVID) [id number]  ?PROTIME-INR  ?ETHANOL  ?URINALYSIS, ROUTINE W REFLEX MICROSCOPIC  ?LACTIC ACID, PLASMA  ?TYPE AND SCREEN  ?TROPONIN I (HIGH SENSITIVITY)  ? ? ?EKG ?EKG Interpretation ? ?Date/Time:  Tuesday Mar 09 2022 21:26:05 EDT ?Ventricular Rate:  92 ?PR Interval:  162 ?QRS Duration: 152 ?QT Interval:  432 ?QTC Calculation: 535 ?R Axis:   -87 ?Text Interpretation: Sinus rhythm IVCD, consider atypical RBBB ATRIAL PACED RHYTHM Confirmed by Ronnald Nian, Adam ([id number]) on  03/09/2022 9:52:19 PM ? ?Radiology ?DG Elbow 2 Views Left ? ?Result Date: 03/09/2022 ?CLINICAL DATA:  [place], deformity EXAM: LEFT ELBOW - 2 VIEW COMPARISON:  None Available. FINDINGS: Frontal, oblique, lateral views of the left elbow are obtained. No acute displaced fracture, subluxation, or dislocation. Mild osteoarthritis. No joint effusion. Soft tissues are unremarkable. IMPRESSION: 1. Mild osteoarthritis.  No acute fracture. Electronically Signed   By: Randa Ngo M.D.   On: 03/09/2022 22:47  ? ?CT HEAD WO CONTRAST ? ?Result Date: 03/09/2022 ?CLINICAL DATA:  [place], head trauma EXAM: CT HEAD WITHOUT CONTRAST TECHNIQUE: Contiguous axial images were obtained from the base of the skull through the vertex without intravenous contrast. RADIATION DOSE REDUCTION: This exam was performed according to the departmental dose-optimization program which includes automated exposure control, adjustment of the mA and/or kV according to patient size and/or use of iterative reconstruction technique. COMPARISON:  11/06/2021 FINDINGS: Brain: No acute infarct or hemorrhage. Stable chronic ischemic changes within the left cerebellar hemisphere and left occipital cortex. Lateral ventricles and midline structures are stable. No acute extra-axial fluid collections. No mass effect. Vascular: Stable atherosclerosis.  No hyperdense vessel. Skull: Normal. Negative for fracture or focal lesion. Sinuses/Orbits: No acute finding. Other: None. IMPRESSION: 1. No acute  intracranial process. Electronically Signed   By: Randa Ngo M.D.   On: 03/09/2022 22:41  ? ?CT CERVICAL SPINE WO CONTRAST ? ?Result Date: 03/09/2022 ?CLINICAL DATA:  [place], trauma EXAM: CT CERVICAL SPINE WITHOUT

## 2022-03-09 NOTE — Progress Notes (Signed)
Orthopedic Tech Progress Note ?Patient Details:  ?Kevin [name] ?12-14-1945 ?[id number] ? ?Patient ID: Kevin [name], male   DOB: 11-Jul-1946, 76 y.o.   MRN: [id number] ?Level II; not needed. ? ?[place] ?03/09/2022, 10:06 PM ? ?

## 2022-03-09 NOTE — H&P (Signed)
?History and Physical  ? ? ?Kevin [name] OXB:[id number] DOB: [birth year] DOA: 03/09/2022 ? ?PCP: [name], MD  ?Patient coming from: Home. ? ?Chief Complaint: Fall. ? ?HPI: Kevin [name] is a 76 y.o. 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  Patient's wife states that patient usually uses walker but today he did not.  Did not hit his head but did not lose consciousness.  After his fall he had pain in his left shoulder and left hip and was brought to the ER. ? ?ED Course: X-rays in the ER showed left hip fracture and left proximal humerus fracture.  [name] on-call orthopedic surgeon has been consulted.  Patient admitted for further work-up. ? ?Review of Systems: As per HPI, rest all negative. ? ? ?Past Medical History:  ?Diagnosis Date  ? Agent orange exposure   ? in [place]  ? Anemia   ? Dementia ([place])   ? DNI (do not intubate) 01/21/2022  ? DNR (do not resuscitate) 01/21/2022  ? Heart block AV second degree   ? a. s/p PPM implant with subsequent CRTD upgrade  ? High cholesterol   ? History of colon polyps   ? LBBB (left bundle branch block)   ? Nonischemic cardiomyopathy ([place])   ? a. MDT CRTD upgrade 2016  ? Pacemaker 08/27/2013  ? Dual-chamber Medtronic Adapta implanted January 2013 for bradycardia with alternating bundle branch block and 2:1 atrioventricular block   ? Prostate cancer ([place])   ? "not been treated yet; on a wait and see" (01/22/2015)  ? Type II diabetes mellitus ([place])   ? ? ?Past Surgical History:  ?Procedure Laterality Date  ? BASAL CELL CARCINOMA EXCISION    ? "back, face, neck"  ? BI-VENTRICULAR IMPLANTABLE CARDIOVERTER DEFIBRILLATOR UPGRADE N/A 01/22/2015  ? MDT CRTD upgrade by Dr Rayann Heman  ? BIV ICD GENERATOR CHANGEOUT N/A 08/28/2020  ? Procedure: BIV ICD GENERATOR CHANGEOUT;  Surgeon: [name], MD;  Location: [facility];  Service: Cardiovascular;  Laterality: N/A;  ? CARDIAC CATHETERIZATION  01/27/2012  ? normal coronaries, dilated LV c/w nonischemic cardiomyopathy  ? EXAMINATION UNDER ANESTHESIA  04/06/2012  ? Procedure: EXAM UNDER ANESTHESIA;  Surgeon: Joyice Faster. Cornett, MD;  Location: [facility];  Service: General;  Laterality: N/A;  ? INCISION AND DRAINAGE PERIRECTAL ABSCESS  ~ 2010; 2015  ? INGUINAL HERNIA REPAIR Left 1980  ? IR ANGIOGRAM PELVIS SELECTIVE OR SUPRASELECTIVE  10/20/2021  ? IR ANGIOGRAM VISCERAL SELECTIVE  10/20/2021  ? IR EMBO ART  VEN HEMORR LYMPH EXTRAV  INC GUIDE ROADMAPPING  10/20/2021  ? IR US GUIDE VASC ACCESS LEFT  10/20/2021  ? LEFT HEART CATHETERIZATION WITH CORONARY ANGIOGRAM N/A 01/27/2012  ? Procedure: LEFT HEART CATHETERIZATION WITH CORONARY ANGIOGRAM;  Surgeon: Troy Sine, MD;  Location: [facility];  Service: Cardiovascular;  Laterality: N/A;  ? NM MYOCAR PERF WALL MOTION  01/21/2012  ? mod-severe perfusion defect due to infarct/scar w/mild perinfarct ischemia in the apical,basal inferoseptal,basal inferior,mid inferoseptal,midinferior and apical inferior regions  ? pacemaker battery    ? PERMANENT PACEMAKER INSERTION N/A 12/01/2011  ? Medtronic implanted by Dr Sallyanne Kuster  ? PROSTATE BIOPSY    ? PROSTATE BIOPSY    ? shrapnel surgery    ? "in [place]"  ? TONSILLECTOMY    ? ? ? reports that he quit  smoking about 37 years ago. His smoking use included cigarettes. He has a 20.00 pack-year smoking history. He has never used smokeless tobacco. He reports current alcohol use. He reports that he does not use drugs. ? ?Allergies  ?Allergen Reactions  ? Sulfa Antibiotics Swelling  ?  Swelling of lips only.  ? 5-Alpha Reductase Inhibitors   ?  Patient and wife state OK for patient to take finasteride - has not taken it before, NKA  ? ? ?Family History  ?Problem Relation Age of Onset  ? Ovarian cancer Mother   ? Cancer Mother   ?     ?uterine  ? Leukemia Father   ? Heart disease Father   ? Diabetes  Neg Hx   ? ? ?Prior to Admission medications   ?Medication Sig Start Date End Date Taking? Authorizing Provider  ?acetaminophen (TYLENOL) 325 MG tablet Take 1-2 tablets (325-650 mg total) by mouth every 4 (four) hours as needed for mild pain. 11/19/21   Angiulli, Lavon Paganini, PA-C  ?atorvastatin (LIPITOR) 80 MG tablet Take 0.5 tablets (40 mg total) by mouth every evening. 11/19/21   Angiulli, Lavon Paganini, PA-C  ?buPROPion (WELLBUTRIN XL) 150 MG 24 hr tablet Take 2 tablets in AM, 1 tablet in PM ?Patient taking differently: Take 150 mg by mouth daily. 09/10/21   Cameron Sprang, MD  ?Carboxymethylcellulose Sodium (EYE DROPS OP) Place 1 drop into both eyes 3 (three) times daily. Given by [name] doctor to help astigmtism    [provider]  ?cholecalciferol (VITAMIN D) 25 MCG (1000 UNIT) tablet Take 1 tablet (1,000 Units total) by mouth daily. 11/19/21   Angiulli, Lavon Paganini, PA-C  ?Continuous Blood Gluc Sensor (FREESTYLE LIBRE 14 DAY SENSOR) MISC SMARTSIG:1 Each Topical Every 2 Weeks 11/30/21   [provider]  ?donepezil (ARICEPT) 5 MG tablet Take 1 tablet (5 mg total) by mouth daily. 09/10/21   Cameron Sprang, MD  ?loratadine (CLARITIN) 10 MG tablet Take 1 tablet (10 mg total) by mouth daily. ?Patient taking differently: Take 10 mg by mouth daily as needed for rhinitis. 11/19/21   Angiulli, Lavon Paganini, PA-C  ?magnesium gluconate (MAGONATE) 500 MG tablet Take 0.5 tablets (250 mg total) by mouth daily. 11/19/21   Angiulli, Lavon Paganini, PA-C  ?memantine (NAMENDA) 10 MG tablet Take 1 tablet twice a day ?Patient taking differently: Take 10 mg by mouth 2 (two) times daily. 09/10/21   Cameron Sprang, MD  ?mirtazapine (REMERON SOL-TAB) 15 MG disintegrating tablet Take 1 tablet (15 mg total) by mouth at bedtime. ?Patient taking differently: Take 15 mg by mouth at bedtime. For appetite 01/04/22   Burchette, Alinda Sierras, MD  ?polyethylene glycol (MIRALAX / GLYCOLAX) 17 g packet Take 17 g by mouth daily as needed for mild constipation.  11/19/21   Angiulli, Lavon Paganini, PA-C  ? ? ?Physical Exam: ?Constitutional: Moderately built and nourished. ?Vitals:  ? 03/09/22 2125 03/09/22 2127 03/09/22 2130 03/09/22 2249  ?BP: (!) 152/80  (!) 152/87 (!) 141/70  ?Pulse: 88  92 95  ?Resp: 20  (!) 28 20  ?Temp: 98.3 ?F (36.8 ?C)     ?TempSrc: Oral     ?SpO2: 98%  97% 96%  ?Height:  '6\' 4"'$  (1.93 m)    ? ?Eyes: Anicteric no pallor. ?ENMT: No discharge from the ears eyes nose and mouth. ?Neck: No mass felt.  No neck rigidity. ?Respiratory: No rhonchi or crepitations. ?Cardiovascular: S1-S2 heard. ?Abdomen: Soft nontender bowel sound present. ?Musculoskeletal: Left shoulder is in  brace.  Pain on moving left hip. ?Skin: No rash. ?Neurologic: Alert awake oriented time place and person.  Moves all extremities. ?Psychiatric: Appears normal.  Normal affect. ? ? ?Labs on Admission: I have personally reviewed following labs and imaging studies ? ?CBC: ?Recent Labs  ?Lab 03/09/22 ?2139 03/09/22 ?2225  ?WBC 10.2  --   ?NEUTROABS 8.2*  --   ?HGB 12.1* 12.2*  ?HCT 38.7* 36.0*  ?MCV 107.2*  --   ?PLT 179  --   ? ?Basic Metabolic Panel: ?Recent Labs  ?Lab 03/09/22 ?2139 03/09/22 ?2225  ?NA 141 142  ?K 4.3 4.2  ?CL 109 105  ?CO2 25  --   ?GLUCOSE 180* 174*  ?BUN 16 20  ?CREATININE 1.45* 1.40*  ?CALCIUM 9.1  --   ? ?GFR: ?CrCl cannot be calculated (Unknown ideal weight.). ?Liver Function Tests: ?No results for input(s): AST, ALT, ALKPHOS, BILITOT, PROT, ALBUMIN in the last 168 hours. ?No results for input(s): LIPASE, AMYLASE in the last 168 hours. ?No results for input(s): AMMONIA in the last 168 hours. ?Coagulation Profile: ?Recent Labs  ?Lab 03/09/22 ?2139  ?INR 1.0  ? ?Cardiac Enzymes: ?No results for input(s): CKTOTAL, CKMB, CKMBINDEX, TROPONINI in the last 168 hours. ?BNP (last 3 results) ?No results for input(s): PROBNP in the last 8760 hours. ?HbA1C: ?No results for input(s): HGBA1C in the last 72 hours. ?CBG: ?No results for input(s): GLUCAP in the last 168 hours. ?Lipid  Profile: ?No results for input(s): CHOL, HDL, LDLCALC, TRIG, CHOLHDL, LDLDIRECT in the last 72 hours. ?Thyroid Function Tests: ?No results for input(s): TSH, T4TOTAL, FREET4, T3FREE, THYROIDAB in the last 72 hours.

## 2022-03-09 NOTE — ED Notes (Signed)
Delay in arrival to CT - activated after arrival, no CT table available. ?

## 2022-03-09 NOTE — ED Triage Notes (Signed)
Pt BIB [facility] from home after a ground level fall. Pt stated he tripped over trash can. Denies hitting head and LOC. L shoulder deformity and L hip deformity. Pt takes plavix. ?Hx- CVA and dementia ?

## 2022-03-09 NOTE — ED Notes (Signed)
Pt transported to CT ?

## 2022-03-09 NOTE — ED Provider Notes (Signed)
Shared visit.  Patient with history of stroke and dementia on Plavix.  Mechanical fall.  Tripped over a trash can in the garage.  Has pain to his left shoulder and left hip.  States that he did hit his head.  No other tenderness on exam.  He appears to have deformity to the left shoulder tenderness in the left hip that is rotated and shortened.  We will check medical screening labs as suspect he has a broken hip.  CT scan of head, neck has been ordered.  Basic labs as well as x-ray of his left shoulder and left hip. ? ?Per review and interpretation of his x-rays he appears to have a proximal humeral fracture as well as proximal femur fracture.  We will talk with orthopedics and admit to medicine.  CT scans and blood work pending.  Please see PA note for further results, evaluation. ? ?This chart was dictated using voice recognition software.  Despite best efforts to proofread,  errors can occur which can change the documentation meaning.  ?  Lennice Sites, DO ?03/09/22 2217 ? ?

## 2022-03-09 NOTE — ED Notes (Signed)
This RN communicated with Medtr[employer] rep, per rep no atrial arrhythmias noted. However, the fluid monitor noted an accumulation of fluid in lungs since 3/8.  ?

## 2022-03-09 NOTE — Progress Notes (Signed)
Orthopedic Tech Progress Note ?Patient Details:  ?ALECK LOCKLIN ?09/19/1946 ?[id number] ? ?Ortho Devices ?Type of Ortho Device: Shoulder immobilizer ?Ortho Device/Splint Location: LUE ?Ortho Device/Splint Interventions: Ordered, Application, Adjustment ?  ?Post Interventions ?Patient Tolerated: Well ?Instructions Provided: Care of device, Adjustment of device ? ?Vernona Rieger ?03/09/2022, 11:39 PM ? ?

## 2022-03-09 NOTE — Progress Notes (Addendum)
Consult request received for displaced left femoral neck fracture and left proximal humerus fracture after a fall. ? ?Dementia ?Subdural history ?On Plavix ?Cardiomyopathy ?Pacemaker ?DM ?H/o failure to thrive ?Renal insufficiency ?+ more ? ?Have discussed with the requesting MD patient's stability, pain control, and ongoing work up. I have reviewed x-rays and developed a provisional plan for hemiarthroplasty of the left hip and repair of the left proximal humerus. ? ? ?Full consultation to follow in the am. ? ?Altamese Pleasant Hope, MD ?[facility], [place] ?[phone] ? ?

## 2022-03-10 ENCOUNTER — Inpatient Hospital Stay (HOSPITAL_COMMUNITY): Payer: No Typology Code available for payment source | Admitting: Anesthesiology

## 2022-03-10 ENCOUNTER — Other Ambulatory Visit: Payer: Self-pay

## 2022-03-10 ENCOUNTER — Encounter (HOSPITAL_COMMUNITY)
Admission: EM | Disposition: A | Discharge status: Rehabilitation Facility | Payer: Self-pay | Source: Home / Self Care | Attending: Internal Medicine

## 2022-03-10 ENCOUNTER — Inpatient Hospital Stay (HOSPITAL_COMMUNITY): Payer: No Typology Code available for payment source

## 2022-03-10 ENCOUNTER — Encounter (HOSPITAL_COMMUNITY): Payer: Self-pay | Admitting: Internal Medicine

## 2022-03-10 DIAGNOSIS — I110 Hypertensive heart disease with heart failure: Secondary | ICD-10-CM

## 2022-03-10 DIAGNOSIS — S72002A Fracture of unspecified part of neck of left femur, initial encounter for closed fracture: Secondary | ICD-10-CM

## 2022-03-10 DIAGNOSIS — I5042 Chronic combined systolic (congestive) and diastolic (congestive) heart failure: Secondary | ICD-10-CM | POA: Diagnosis not present

## 2022-03-10 DIAGNOSIS — I495 Sick sinus syndrome: Secondary | ICD-10-CM

## 2022-03-10 DIAGNOSIS — I509 Heart failure, unspecified: Secondary | ICD-10-CM

## 2022-03-10 DIAGNOSIS — E119 Type 2 diabetes mellitus without complications: Secondary | ICD-10-CM

## 2022-03-10 DIAGNOSIS — Z66 Do not resuscitate: Secondary | ICD-10-CM

## 2022-03-10 DIAGNOSIS — Z96642 Presence of left artificial hip joint: Secondary | ICD-10-CM

## 2022-03-10 DIAGNOSIS — S42201A Unspecified fracture of upper end of right humerus, initial encounter for closed fracture: Secondary | ICD-10-CM

## 2022-03-10 HISTORY — PX: HIP ARTHROPLASTY: SHX981

## 2022-03-10 LAB — TROPONIN I (HIGH SENSITIVITY)
Troponin I (High Sensitivity): 15 ng/L (ref <18)
Troponin I (High Sensitivity): 17 ng/L (ref <18)

## 2022-03-10 LAB — URINALYSIS, ROUTINE W REFLEX MICROSCOPIC
Bilirubin Urine: NEGATIVE
Glucose, UA: NEGATIVE mg/dL
Hgb urine dipstick: NEGATIVE
Ketones, ur: NEGATIVE mg/dL
Nitrite: NEGATIVE
Protein, ur: NEGATIVE mg/dL
Specific Gravity, Urine: 1.012 (ref 1.005–1.030)
pH: 5 (ref 5.0–8.0)

## 2022-03-10 LAB — CBG MONITORING, ED
Glucose-Capillary: 152 mg/dL — ABNORMAL HIGH (ref 70–99)
Glucose-Capillary: 134 mg/dL — ABNORMAL HIGH (ref 70–99)
Glucose-Capillary: 163 mg/dL — ABNORMAL HIGH (ref 70–99)
Glucose-Capillary: 155 mg/dL — ABNORMAL HIGH (ref 70–99)

## 2022-03-10 LAB — CBC
HCT: 33.9 % — ABNORMAL LOW (ref 39.0–52.0)
Hemoglobin: 11 g/dL — ABNORMAL LOW (ref 13.0–17.0)
MCH: 34.5 pg — ABNORMAL HIGH (ref 26.0–34.0)
MCHC: 32.4 g/dL (ref 30.0–36.0)
MCV: 106.3 fL — ABNORMAL HIGH (ref 80.0–100.0)
Platelets: 156 10*3/uL (ref 150–400)
RBC: 3.19 MIL/uL — ABNORMAL LOW (ref 4.22–5.81)
RDW: 13.2 % (ref 11.5–15.5)
WBC: 11.9 10*3/uL — ABNORMAL HIGH (ref 4.0–10.5)
nRBC: 0 % (ref 0.0–0.2)

## 2022-03-10 LAB — BASIC METABOLIC PANEL
Anion gap: 7 (ref 5–15)
BUN: 17 mg/dL (ref 8–23)
CO2: 25 mmol/L (ref 22–32)
Calcium: 8.7 mg/dL — ABNORMAL LOW (ref 8.9–10.3)
Chloride: 109 mmol/L (ref 98–111)
Creatinine, Ser: 1.34 mg/dL — ABNORMAL HIGH (ref 0.61–1.24)
GFR, Estimated: 55 mL/min — ABNORMAL LOW (ref >60)
Glucose, Bld: 164 mg/dL — ABNORMAL HIGH (ref 70–99)
Potassium: 4.4 mmol/L (ref 3.5–5.1)
Sodium: 141 mmol/L (ref 135–145)

## 2022-03-10 LAB — GLUCOSE, CAPILLARY
Glucose-Capillary: 179 mg/dL — ABNORMAL HIGH (ref 70–99)
Glucose-Capillary: 180 mg/dL — ABNORMAL HIGH (ref 70–99)

## 2022-03-10 SURGERY — HEMIARTHROPLASTY, HIP, DIRECT ANTERIOR APPROACH, FOR FRACTURE
Anesthesia: General | Site: Hip | Laterality: Left

## 2022-03-10 MED ORDER — SODIUM CHLORIDE (PF) 0.9 % IJ SOLN: INTRAMUSCULAR | Status: DC | PRN

## 2022-03-10 MED ORDER — FENTANYL CITRATE (PF) 100 MCG/2ML IJ SOLN
INTRAMUSCULAR | Status: AC
  Administered 2022-03-10: 100 ug
  Filled 2022-03-10: qty 2

## 2022-03-10 MED ORDER — TRANEXAMIC ACID-NACL 1000-0.7 MG/100ML-% IV SOLN
1000.0000 mg | INTRAVENOUS | Status: AC
  Administered 2022-03-10: 1000 mg via INTRAVENOUS
  Filled 2022-03-10: qty 100

## 2022-03-10 MED ORDER — FENTANYL CITRATE (PF) 250 MCG/5ML IJ SOLN
INTRAMUSCULAR | Status: AC
  Filled 2022-03-10: qty 5

## 2022-03-10 MED ORDER — DEXAMETHASONE SODIUM PHOSPHATE 4 MG/ML IJ SOLN
INTRAMUSCULAR | Status: DC | PRN
  Administered 2022-03-10: 10 mg via PERINEURAL

## 2022-03-10 MED ORDER — FENTANYL CITRATE (PF) 100 MCG/2ML IJ SOLN
25.0000 ug | INTRAMUSCULAR | Status: DC | PRN
  Administered 2022-03-10: 50 ug via INTRAVENOUS

## 2022-03-10 MED ORDER — CEFAZOLIN SODIUM-DEXTROSE 2-4 GM/100ML-% IV SOLN
2.0000 g | INTRAVENOUS | Status: AC
  Administered 2022-03-10: 2 g via INTRAVENOUS
  Filled 2022-03-10: qty 100

## 2022-03-10 MED ORDER — ROCURONIUM BROMIDE 10 MG/ML (PF) SYRINGE
PREFILLED_SYRINGE | INTRAVENOUS | Status: DC | PRN
  Administered 2022-03-10: 30 mg via INTRAVENOUS

## 2022-03-10 MED ORDER — DEXAMETHASONE SODIUM PHOSPHATE 10 MG/ML IJ SOLN
INTRAMUSCULAR | Status: DC | PRN
Start: 2022-03-10 — End: 2022-03-10
  Administered 2022-03-10: 5 mg via INTRAVENOUS

## 2022-03-10 MED ORDER — LACTATED RINGERS IV SOLN: INTRAVENOUS | Status: DC | PRN

## 2022-03-10 MED ORDER — BUPIVACAINE LIPOSOME 1.3 % IJ SUSP
INTRAMUSCULAR | Status: DC | PRN
  Administered 2022-03-10: 80 mL

## 2022-03-10 MED ORDER — AMISULPRIDE (ANTIEMETIC) 5 MG/2ML IV SOLN: 10.0000 mg | Freq: Once | INTRAVENOUS | Status: DC | PRN

## 2022-03-10 MED ORDER — CHLORHEXIDINE GLUCONATE 4 % EX LIQD
60.0000 mL | Freq: Once | CUTANEOUS | Status: DC
  Filled 2022-03-10: qty 60

## 2022-03-10 MED ORDER — PROPOFOL 10 MG/ML IV BOLUS
INTRAVENOUS | Status: DC | PRN
  Administered 2022-03-10: 100 mg via INTRAVENOUS

## 2022-03-10 MED ORDER — FENTANYL CITRATE (PF) 100 MCG/2ML IJ SOLN
INTRAMUSCULAR | Status: AC
  Filled 2022-03-10: qty 2

## 2022-03-10 MED ORDER — PHENYLEPHRINE HCL-NACL 20-0.9 MG/250ML-% IV SOLN
INTRAVENOUS | Status: DC | PRN
  Administered 2022-03-10: 30 ug/min via INTRAVENOUS

## 2022-03-10 MED ORDER — 0.9 % SODIUM CHLORIDE (POUR BTL) OPTIME
TOPICAL | Status: DC | PRN
  Administered 2022-03-10: 1000 mL

## 2022-03-10 MED ORDER — ONDANSETRON HCL 4 MG/2ML IJ SOLN
INTRAMUSCULAR | Status: DC | PRN
Start: 2022-03-10 — End: 2022-03-10
  Administered 2022-03-10: 4 mg via INTRAVENOUS

## 2022-03-10 MED ORDER — LIDOCAINE 2% (20 MG/ML) 5 ML SYRINGE
INTRAMUSCULAR | Status: DC | PRN
Start: 2022-03-10 — End: 2022-03-10
  Administered 2022-03-10: 60 mg via INTRAVENOUS

## 2022-03-10 MED ORDER — PROPOFOL 10 MG/ML IV BOLUS
INTRAVENOUS | Status: AC
  Filled 2022-03-10: qty 20

## 2022-03-10 MED ORDER — ONDANSETRON HCL 4 MG/2ML IJ SOLN: 4.0000 mg | Freq: Once | INTRAMUSCULAR | Status: DC | PRN

## 2022-03-10 MED ORDER — ACETAMINOPHEN 10 MG/ML IV SOLN: 1000.0000 mg | Freq: Once | INTRAVENOUS | Status: DC | PRN

## 2022-03-10 MED ORDER — ALBUMIN HUMAN 5 % IV SOLN: INTRAVENOUS | Status: DC | PRN

## 2022-03-10 MED ORDER — BUPIVACAINE LIPOSOME 1.3 % IJ SUSP
INTRAMUSCULAR | Status: AC
  Filled 2022-03-10: qty 20

## 2022-03-10 MED ORDER — PHENYLEPHRINE 80 MCG/ML (10ML) SYRINGE FOR IV PUSH (FOR BLOOD PRESSURE SUPPORT)
PREFILLED_SYRINGE | INTRAVENOUS | Status: DC | PRN
  Administered 2022-03-10 (×2): 160 ug via INTRAVENOUS
  Administered 2022-03-10: 80 ug via INTRAVENOUS

## 2022-03-10 MED ORDER — POVIDONE-IODINE 10 % EX SWAB: 2.0000 "application " | Freq: Once | CUTANEOUS | Status: DC

## 2022-03-10 MED ORDER — SUGAMMADEX SODIUM 200 MG/2ML IV SOLN
INTRAVENOUS | Status: DC | PRN
  Administered 2022-03-10: 130 mg via INTRAVENOUS

## 2022-03-10 MED ORDER — ROPIVACAINE HCL 5 MG/ML IJ SOLN
INTRAMUSCULAR | Status: DC | PRN
Start: 2022-03-10 — End: 2022-03-10
  Administered 2022-03-10: 30 mL via PERINEURAL

## 2022-03-10 MED ORDER — SODIUM CHLORIDE 0.9 % IR SOLN
Status: DC | PRN
  Administered 2022-03-10: 3000 mL

## 2022-03-10 MED ORDER — FENTANYL CITRATE (PF) 250 MCG/5ML IJ SOLN
INTRAMUSCULAR | Status: DC | PRN
  Administered 2022-03-10: 25 ug via INTRAVENOUS
  Administered 2022-03-10: 50 ug via INTRAVENOUS
  Administered 2022-03-10: 150 ug via INTRAVENOUS

## 2022-03-10 MED ORDER — FENTANYL CITRATE PF 50 MCG/ML IJ SOSY: 50.0000 ug | PREFILLED_SYRINGE | Freq: Once | INTRAMUSCULAR | Status: DC

## 2022-03-10 SURGICAL SUPPLY — 67 items
ADH SKN CLS APL DERMABOND .7 (GAUZE/BANDAGES/DRESSINGS) ×1
APL PRP STRL LF DISP 70% ISPRP (MISCELLANEOUS) ×2
BLADE SAGITTAL 25.0X1.27X90 (BLADE) ×3 IMPLANT
BRUSH FEMORAL CANAL (MISCELLANEOUS) ×1 IMPLANT
CEMENT BONE SIMPLEX SPEEDSET (Cement) ×2 IMPLANT
CEMENT RESTRICTOR BONE PREP ST (Cement) ×1 IMPLANT
CHLORAPREP W/TINT 26 (MISCELLANEOUS) ×6 IMPLANT
COVER MAYO STAND STRL (DRAPES) ×1 IMPLANT
COVER SURGICAL LIGHT HANDLE (MISCELLANEOUS) ×3 IMPLANT
DERMABOND ADVANCED (GAUZE/BANDAGES/DRESSINGS) ×1
DERMABOND ADVANCED .7 DNX12 (GAUZE/BANDAGES/DRESSINGS) ×2 IMPLANT
DRAPE HALF SHEET 40X57 (DRAPES) ×2 IMPLANT
DRAPE HIP W/POCKET STRL (MISCELLANEOUS) ×3 IMPLANT
DRAPE INCISE IOBAN 66X45 STRL (DRAPES) ×3 IMPLANT
DRAPE INCISE IOBAN 85X60 (DRAPES) ×3 IMPLANT
DRAPE POUCH INSTRU U-SHP 10X18 (DRAPES) ×3 IMPLANT
DRAPE U-SHAPE 47X51 STRL (DRAPES) ×6 IMPLANT
DRSG AQUACEL AG ADV 3.5X10 (GAUZE/BANDAGES/DRESSINGS) ×3 IMPLANT
ELECT BLADE 4.0 EZ CLEAN MEGAD (MISCELLANEOUS) ×2
ELECT REM PT RETURN 15FT ADLT (MISCELLANEOUS) ×3 IMPLANT
ELECTRODE BLDE 4.0 EZ CLN MEGD (MISCELLANEOUS) ×2 IMPLANT
GLOVE SRG 8 PF TXTR STRL LF DI (GLOVE) ×2 IMPLANT
GLOVE SURG ORTHO 8.0 STRL STRW (GLOVE) ×3 IMPLANT
GLOVE SURG ORTHO LTX SZ8 (GLOVE) ×6 IMPLANT
GLOVE SURG UNDER POLY LF SZ8 (GLOVE) ×2
GOWN STRL REUS W/ TWL LRG LVL3 (GOWN DISPOSABLE) ×4 IMPLANT
GOWN STRL REUS W/ TWL XL LVL3 (GOWN DISPOSABLE) ×2 IMPLANT
GOWN STRL REUS W/TWL LRG LVL3 (GOWN DISPOSABLE) ×6
GOWN STRL REUS W/TWL XL LVL3 (GOWN DISPOSABLE) ×2
HANDPIECE INTERPULSE COAX TIP (DISPOSABLE)
HEAD ACET UNI HIP BP 53X28 (Head) IMPLANT
HEAD CERAMIC V40 BIOLOX DEL 28 (Orthopedic Implant) ×1 IMPLANT
HEAD UNI HIP BIPOLAR 53X28MM (Head) ×2 IMPLANT
HOOD PEEL AWAY FLYTE STAYCOOL (MISCELLANEOUS) ×8 IMPLANT
KIT BASIN OR (CUSTOM PROCEDURE TRAY) ×3 IMPLANT
KIT TURNOVER KIT A (KITS) ×3 IMPLANT
MANIFOLD NEPTUNE II (INSTRUMENTS) ×3 IMPLANT
MARKER SKIN DUAL TIP RULER LAB (MISCELLANEOUS) ×3 IMPLANT
NDL 18GX1X1/2 (RX/OR ONLY) (NEEDLE) ×2 IMPLANT
NEEDLE 18GX1X1/2 (RX/OR ONLY) (NEEDLE) ×2 IMPLANT
NS IRRIG 1000ML POUR BTL (IV SOLUTION) ×3 IMPLANT
PACK TOTAL JOINT (CUSTOM PROCEDURE TRAY) ×3 IMPLANT
PRESSURIZER FEMORAL UNIV (MISCELLANEOUS) ×3 IMPLANT
PULSAVAC PLUS IRRIG FAN TIP (DISPOSABLE) ×2
RETRIEVER SUT HEWSON (MISCELLANEOUS) ×3 IMPLANT
SEALER BIPOLAR AQUA 6.0 (INSTRUMENTS) ×3 IMPLANT
SET HNDPC FAN SPRY TIP SCT (DISPOSABLE) IMPLANT
SET INTERPULSE LAVAGE W/TIP (ORTHOPEDIC DISPOSABLE SUPPLIES) IMPLANT
SPONGE T-LAP 18X18 ~~LOC~~+RFID (SPONGE) ×6 IMPLANT
STEM FEM CMT SZ7 V40 40X158 (Stem) ×1 IMPLANT
SUCTION FRAZIER HANDLE 10FR (MISCELLANEOUS)
SUCTION TUBE FRAZIER 10FR DISP (MISCELLANEOUS) IMPLANT
SUT ETHIBOND 2 V 37 (SUTURE) ×4 IMPLANT
SUT MNCRL AB 3-0 PS2 18 (SUTURE) ×3 IMPLANT
SUT MNCRL AB 3-0 PS2 27 (SUTURE) ×3 IMPLANT
SUT STRATAFIX 1PDS 45CM VIOLET (SUTURE) ×6 IMPLANT
SUT VIC AB 0 CT1 27 (SUTURE) ×2
SUT VIC AB 0 CT1 27XBRD ANBCTR (SUTURE) ×2 IMPLANT
SUT VIC AB 2-0 CT2 27 (SUTURE) ×6 IMPLANT
SYR 20ML LL LF (SYRINGE) ×3 IMPLANT
SYR 50ML LL SCALE MARK (SYRINGE) ×3 IMPLANT
TIP FAN IRRIG PULSAVAC PLUS (DISPOSABLE) IMPLANT
TOWEL GREEN STERILE (TOWEL DISPOSABLE) ×3 IMPLANT
TOWER CARTRIDGE SMART MIX (DISPOSABLE) ×1 IMPLANT
TRAY FOLEY MTR SLVR 16FR STAT (SET/KITS/TRAYS/PACK) ×1 IMPLANT
TUBE SUCT ARGYLE STRL (TUBING) ×3 IMPLANT
WATER STERILE IRR 1000ML POUR (IV SOLUTION) ×3 IMPLANT

## 2022-03-10 NOTE — Op Note (Signed)
03/09/2022 - 03/10/2022 ? ?6:53 PM ? ?PATIENT:  Kevin [name]  ? ?MRN: [id number] ? ?PRE-OPERATIVE DIAGNOSIS: Left displaced femoral neck fracture ? ?POST-OPERATIVE DIAGNOSIS: Same ? ?PROCEDURE:  Procedure(s): ?ARTHROPLASTY BIPOLAR HIP (HEMIARTHROPLASTY) ? ?PREOPERATIVE INDICATIONS:  Kevin [name] is an 76 y.o. male who was admitted 03/09/2022 with a diagnosis of left displaced femoral neck fracture and elected for surgical management.  The risks benefits and alternatives were discussed with the patient including but not limited to the risks of nonoperative treatment, versus surgical intervention including infection, bleeding, nerve injury, periprosthetic fracture, the need for revision surgery, dislocation, leg length discrepancy, blood clots, cardiopulmonary complications, morbidity, mortality, among others, and they were willing to proceed.  Predicted outcome is good, although there will be at least a six to nine month expected recovery.  ? ?OPERATIVE REPORT  ?   ?SURGEON:  Charlies Constable, MD ?   ?ASSISTANT: Izola Price, RNFA (Present throughout the entire procedure,  necessary for completion of procedure in a timely manner, assisting with retraction, instrumentation, and closure)  ?   ?ANESTHESIA: General ? ?ESTIMATED BLOOD LOSS: 300 cc ?   ?COMPLICATIONS:  None.  ?  ? ?COMPONENTS:  ?Cemented St[employer]ker Accolade C size 7, 28+0 ceramic ball, 53 mm bipolar head ball ?Implant Name Type Inv. Item Serial No. Manufacturer Lot No. LRB No. Used Action  ?CEMENT BONE SIMPLEX SPEEDSET - [id number] Cement CEMENT BONE SIMPLEX SPEEDSET  [employer] [id number] Left 2 Implanted  ?STEM FEM CMT SZ7 V40 40X158 - [id number] Stem STEM FEM CMT SZ7 V40 40X158  [employer] [id number] Left 1 Implanted  ?HEAD UNI HIP BIPOLAR 53X28MM - [id number] Head HEAD UNI HIP BIPOLAR 53X28MM  [employer] [id number] Left 1 Implanted  ?HEAD CERAMIC V40 BIOLOX DEL 28 - [id number] Orthopedic Implant HEAD CERAMIC V40 BIOLOX DEL 28  [employer] [id number] Left 1 Implanted  ? ?   ?PROCEDURE IN DETAIL: The patient was met in the holding area and identified.  The appropriate hip  was marked at the operative site. The patient was then transported to the OR and  placed under anesthesia.  At that point, the patient was  placed in the lateral decubitus position with the operative side up and  secured to the operating room table and all bony prominences padded. A subaxillary role was placed. ?   ?The operative lower extremity was prepped from the iliac crest to the ankle.  Sterile draping was performed.  2g of ancef and 1g TXA were given prior to incision. Time out was performed prior to incision.   ?   ?A routine posterolateral approach was utilized via sharp dissection  carried down to the subcutaneous tissue.  Gross bleeders were Bovie  coagulated.  The iliotibial band was identified and incised  along the length of the skin incision.  A Charnley retractor was inserted with care to protect the sciatic nerve.  With the hip internally rotated, the short external rotators  were identified. The piriformis was tagged with #2 Ethibond, and the hip capsule released in a T-type fashion, and posterior sleeve of the capsule was also tagged. ? ?The femoral neck was exposed, and I resected the femoral neck using the appropriate jig. This was performed just distal to the femoral neck fracture leaving about 15 mm of femoral neck above the lesser trochanter ?   ?I then exposed the deep acetabulum, cleared out any tissue including the ligamentum teres. ?   ?I then prepared the proximal femur using the box cutter,  Charnley awl, and then sequentially broached. ? ?A trial utilized, and I reduced the hip, leg lengths were assessed clinically and felt to be equal. The hip was then taken through a full range of motion, the hip was stable at full extension and 90 degrees external rotation without anterior subluxation. The hip was also stable in the position of sleep, and in  neutral abduction up to 90 degrees flexion, and 90 degrees IR. The trial components were then removed.  ? ?We then prepared canal for cementation.  The cement restrictor was measured and inserted distally.  The canal was then irrigated with the pulse lavage and 3 L of normal saline.  2 bags of Simplex cement were prepared.  Using the cement gun the cement was inserted distally and the canal was filled.  We then pressurized the canal. The real implant was then inserted matching the patient's native anteversion of approximately 25 degrees.  We then waited for 13 minutes for the cement to be fully set.  Excess cement was removed.  A lap was placed in the acetabulum prior to cementing was also removed and the acetabulum was assessed to make sure there was no cement or bone fragments. ? ?The hip was then reduced with the trial head again and taken through functional range of motion and found to have excellent stability. Leg lengths were restored. The real head was then impacted onto the stem and the hip was again reduced. ? ?The capsule was then repaired with #2 Ethibond., and the piriformis was repaired to the abductor tendon.  His. Excellent posterior capsular repair was achieved.  ? ?I then irrigated the hip copiously again with pulse lavage. The wounds were injected with 20cc exparal diluted in sterile saline. The fascia and IT band was repaired with #1 stratafix, followed by 0 stratafix for the fat layer followed by 2-0 Vicryl and running 3-0 Monocryl for the skin, Dermabond was applied and an Aquacel dressing was placed.  The patient was then awakened and returned to PACU in stable and satisfactory condition. There were no complications. ? ?Post op recs: ?WB: WBAT with posterior hip precautions x6 weeks ?Abx: ancef x23 hours post op ?Imaging: PACU xrays ?Dressing: Aquacel dressing to be kept intact until follow-up ?DVT prophylaxis: lovenox starting POD1 x4 weeks ?Follow up: 2 weeks after surgery for a wound check  with [name] at [facility].  ?Address: [address] [place], [place], [place] [address]  ?Office Phone: [phone] ? ? ?Charlies Constable, MD ?Orthopedic Surgeon ? ?03/10/2022 6:53 PM ? ?  ?

## 2022-03-10 NOTE — Progress Notes (Signed)
[employer] Rep paged to interrogate ICD. States someone will be here in the morning to see patient. Dr Ermalene Postin made aware.  ?

## 2022-03-10 NOTE — Discharge Instructions (Addendum)
Diet: As you were doing prior to hospitalization  ? ?Shower:  May shower but keep the wounds dry, use an occlusive plastic wrap, NO SOAKING IN TUB.  If the bandage gets wet, change with a clean dry gauze.  If you have a splint on, leave the splint in place and keep the splint dry with a plastic bag. ? ?Dressing:  You may change your dressing 3-5 days after surgery, unless you have a splint.  If the dressing remains clean and dry it can also be left on until follow up. If you change the dressing replace with clean gauze and tape or ace wrap. If you have a splint, then just leave the splint in place and we will change your bandages during your first follow-up appointment.  If water gets in the splint or the splint gets saturated please call the clinic and we can see you to change your splint. ? ?If you had hand or foot surgery, we will plan to remove your stitches in about 2 weeks in the office.  For all other surgeries, there are sticky tapes (steri-strips) on your wounds and all the stitches are absorbable.  Leave the steri-strips in place when changing your dressings, they will peel off with time, usually 2-3 weeks. ? ?Activity:  Increase activity slowly as tolerated, but follow the weight bearing instructions below.  The rules on driving is that you can not be taking narcotics while you drive, and you must feel in control of the vehicle.   ? ?Weight Bearing:   Weight bearing as tolerated left hip.   ?                               Nonweightbearing L arm but can use platform walker. No lifting with left arm  ? ?ROM:  L shoulder ? Pendulums, passive and active shoulder flexion and extension ? Passive shoulder flexion only.  No active abduction (chicken wing motion) ? Unrestricted motion Left elbow, forearm, wrist and hand  ? ?Blood clot prevention (DVT Prophylaxis): After surgery you are at an increased risk for a blood clot. you were prescribed a blood thinner, lovenox '40mg'$ , to be taken once daily for a total of 4  weeks from surgery to help reduce your risk of getting a blood clot. This will help prevent a blood clot. Signs of a pulmonary embolus (blood clot in the lungs) include sudden short of breath, feeling lightheaded or dizzy, chest pain with a deep breath, rapid pulse rapid breathing. Signs of a blood clot in your arms or legs include new unexplained swelling and cramping, warm, red or darkened skin around the painful area. Please call the office or 911 right away if these signs or symptoms develop. ?To prevent constipation: you may use a stool softener such as - ? ?Colace (over the counter) 100 mg by mouth twice a day  ?Drink plenty of fluids (prune juice may be helpful) and high fiber foods ?Miralax (over the counter) for constipation as needed.   ? ?Itching:  If you experience itching with your medications, try taking only a single pain pill, or even half a pain pill at a time.  You may take up to 10 pain pills per day, and you can also use benadryl over the counter for itching or also to help with sleep.  ? ?Precautions:  If you experience chest pain or shortness of breath - call 911 immediately for transfer to  the hospital emergency department!! ? ? ?Call office [phone]) for the following: ?Temperature greater than 101F ?Persistent nausea and vomiting ?Severe uncontrolled pain ?Redness, tenderness, or signs of infection (pain, swelling, redness, odor or green/yellow discharge around the site) ?Difficulty breathing, headache or visual disturbances ?Hives ?Persistent dizziness or light-headedness ?Extreme fatigue ?Any other questions or concerns you may have after discharge ? ?In an emergency, call 911 or go to an Emergency Department at a nearby hospital ? ?Follow- Up Appointment:  Please call for an appointment to be seen approximately 2-3 week after surgery in [facility] with your surgeon Dr. Charlies Constable - [phone] ?Address: [address] [place], [place], [place] [address] ? ?Arrange for follow  up appointment with Dr. Altamese Bolivar in 2 weeks for your Left shoulder: [phone] ?Address: [place] [address] ? ? ?  ?

## 2022-03-10 NOTE — Progress Notes (Signed)
?PROGRESS NOTE ? ? ? ?Kevin [name]  WUJ:[id number] DOB: 1946/02/12 DOA: 03/09/2022 ?PCP: [name], MD  ? 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  Patient states that he tripped on his trash can and the wife states that he usually uses a walker but did not do so at the time when he fell.  He did not hit his head and did not lose consciousness but after his fall he had pain in the left shoulder and left hip is brought to the ED.  X-rays in the ED showed a left hip fracture and a left proximal humerus fracture.  Orthopedic surgery was consulted and the plan is for surgical intervention of his left hip today with Dr. Melida Gimenez in his shoulder tomorrow with Dr. Ginette Pitman.  Currently he is n.p.o. for surgical intervention later this afternoon.  ? ? ?Assessment and Plan: ? ?Left hip fracture and left proximal humerus fracture after mechanical fall  ?-Orthopedic surgery was consulted and appreciate their evaluation and management; plan is for surgical intervention of his left hip today for hemiarthroplasty with him going back to the OR tomorrow for his left shoulder. ?-Continue with morphine 0.5 mg IV every 2 as needed for severe pain and Ondansetron 4 mg IV once ?-Patient had a nerve block ?-Further care per orthopedic surgery ? ?Chronic Combined Systolic and Diastolic CHF status post ICD placement  ?-Appears compensated.  Patient is not on any diuretics.   ?-Continue to monitor for S/Sx of Volume Overload ? ?Diabetes Mellitus Type 2 ?-Initiated on Sensitive NovoLog Sliding Scale every 4 ? ?Chronic Kidney Disease Stage IIIa  ?-Creatinine usually is  around 1.1-1.3. ?-Patient's BUNs/creatinine went from 20/1.40 is now 17/1.34 ?-Avoid further nephrotoxic medications, contrast dyes, hypotension and dehydration to ensure adequate renal perfusion and will need to renally adjust medications ?-Repeat CMP in the a.m. ? ?Hypertension  ?-Presently not on any medication.  Follow closely.   ?-Will place on As needed IV hydralazine for systolic blood pressure more than 160. ?-Continue to Monitor BP per Protocol ?-Last BP was 152/91 ? ?Leukocytosis ?-Patient's WBC went from 10.2 -> 11.9 ?-Likely reactive in the setting of his fall ?-U/A done and showed clear appearance with yellow color urine, Small Leukocytes, Negative Nitrites, many bacteria, present mucus, 0-5 RBCs per high-power field, 21-50 WBCs ?-Continue to monitor for signs and symptoms of infection; no overt infection noted ?-Repeat CBC in a.m. ? ?History of Prior Stroke ?-He is on Plavix which is on hold for now in anticipation of surgery. ? ?Macrocytic Anemia  ?-Patient's Hgb/Hct went from 12.2/36.0 -> 11.0/33.9 with an MCV of 106.3 ?-Check Anemia Panel in the AM  ?-Continue to Monitor for S/Sx of Bleeding; No overt bleeding noted ?-Repeat CBC in the AM  ? ?Dementia  ?-No acute issues at this time.  Resume home medications after surgery when patient can take p.o. ?-He was resumed on his home Mirtazapine 15 mg p.o. nightly ?  ?DVT prophylaxis: SCDs Start: 03/09/22 2302 ? ?  Code Status: DNR ?Family Communication: Discussed with Wife at bedside  ? ?Disposition Plan:  ?Level of care: Telemetry Medical ?Status is: Inpatient ?Remains inpatient appropriate because: Needs Surgical Intervention ?  ?  Consultants:  ?Orthopedic Surgery  ? ?Procedures:  ?None ? ?Antimicrobials:  ?Anti-infectives (From admission, onward)  ? ? None  ? ?  ?  ?Subjective: ?Seen examined at bedside and he is complaining about some pain in his leg when it was manipulated.  No nausea or vomiting.  Felt okay.  States that he will use his walker from  now on.  Denies chest pain or shortness of breath.  Feels okay.  No other concerns or complaints at this time. ? ?Objective: ?Vitals:  ? 03/10/22 0945 03/10/22 0951 03/10/22 1005 03/10/22 1158  ?BP:  (!) 142/78 (!) 158/88 (!) 152/91  ?Pulse: 95 95 95 98  ?Resp:  20  17  ?Temp:      ?TempSrc:      ?SpO2:  99% 100% 100%  ?Weight:      ?Height:      ? ? ?Intake/Output Summary (Last 24 hours) at 03/10/2022 1206 ?Last data filed at 03/09/2022 2339 ?Gross per 24 hour  ?Intake 1047.71 ml  ?Output --  ?Net 1047.71 ml  ? ?Filed Weights  ? 03/09/22 2125  ?Weight: 68 kg  ? ?Examination: ?Physical Exam: ? ?Constitutional: Thin Caucasian male in NAD in a Left Arm Sling.  ?Respiratory: Diminished to auscultation bilaterally with coarse breath sounds, no wheezing, rales, rhonchi or crackles. Normal respiratory effort and patient is not tachypenic. No accessory muscle use. Unlabored breathing  ?Cardiovascular: RRR, no murmurs / rubs / gallops. S1 and S2 auscultated.  ?Abdomen: Soft, non-tender, non-distended. Bowel sounds positive.  ?GU: Deferred. ?Musculoskeletal: Leg is externally rotated and shorter than the right leg and left arm is in a sling ?Skin: No rashes, lesions, ulcers on limited skin evaluation. No induration; Warm and dry.  ?Neurologic: CN 2-12 grossly intact with no focal deficits. Romberg sign and cerebellar reflexes not assessed.  ?Psychiatric: Normal judgment and insight. Alert and oriented x 3. Normal mood and appropriate affect.  ? ?Data Reviewed: I have personally reviewed following labs and imaging studies ? ?CBC: ?Recent Labs  ?Lab 03/09/22 ?2139 03/09/22 ?2225 03/10/22 ?0416  ?WBC 10.2  --  11.9*  ?NEUTROABS 8.2*  --   --   ?HGB 12.1* 12.2* 11.0*  ?HCT 38.7* 36.0* 33.9*  ?MCV 107.2*  --  106.3*  ?PLT 179  --  156  ? ?Basic Metabolic Panel: ?Recent Labs  ?Lab 03/09/22 ?2139 03/09/22 ?2225 03/10/22 ?0416  ?NA 141 142 141  ?K 4.3 4.2 4.4  ?CL 109 105 109  ?CO2 25  --  25  ?GLUCOSE 180* 174* 164*  ?BUN '16 20 17   '$ ?CREATININE 1.45* 1.40* 1.34*  ?CALCIUM 9.1  --  8.7*  ? ?GFR: ?Estimated Creatinine Clearance: 45.1 mL/min (A) (by C-G formula based on SCr of 1.34 mg/dL (H)). ?Liver Function Tests: ?No results for input(s): AST, ALT, ALKPHOS, BILITOT, PROT, ALBUMIN in the last 168 hours. ?No results for input(s): LIPASE, AMYLASE in the last 168 hours. ?No results for input(s): AMMONIA in the last 168 hours. ?Coagulation Profile: ?Recent Labs  ?Lab 03/09/22 ?2139  ?INR 1.0  ? ?Cardiac Enzymes: ?No results for input(s): CKTOTAL, CKMB, CKMBINDEX, TROPONINI in the last 168 hours. ?BNP (last 3 results) ?No results for input(s): PROBNP in the last 8760 hours. ?HbA1C: ?No results for input(s): HGBA1C in the last 72 hours. ?CBG: ?Recent Labs  ?Lab 03/09/22 ?2339 03/10/22 ?5361  ?GLUCAP 155* 152*  ? ?Lipid Profile: ?No results for input(s): CHOL, HDL, LDLCALC, TRIG, CHOLHDL, LDLDIRECT in the last 72 hours. ?Thyroid Function Tests: ?  No results for input(s): TSH, T4TOTAL, FREET4, T3FREE, THYROIDAB in the last 72 hours. ?Anemia Panel: ?No results for input(s): VITAMINB12, FOLATE, FERRITIN, TIBC, IRON, RETICCTPCT in the last 72 hours. ?Sepsis Labs: ?Recent Labs  ?Lab 03/09/22 ?2221  ?LATICACIDVEN 1.5  ? ? ?Recent Results (from the past 240 hour(s))  ?Resp Panel by RT-PCR (Flu A&B, Covid) Nasopharyngeal Swab     Status: None  ? Collection Time: 03/09/22 10:01 PM  ? Specimen: Nasopharyngeal Swab; Nasopharyngeal(NP) swabs in vial transport medium  ?Result Value Ref Range Status  ? SARS Coronavirus 2 by RT PCR NEGATIVE NEGATIVE Final  ?  Comment: (NOTE) ?SARS-CoV-2 target nucleic acids are NOT DETECTED. ? ?The SARS-CoV-2 RNA is generally detectable in upper respiratory ?specimens during the acute phase of infection. The lowest ?concentration of SARS-CoV-2 viral copies this assay can detect is ?138 copies/mL. A negative result does not preclude SARS-Cov-2 ?infection and should not be used as the sole basis for treatment or ?other patient  management decisions. A negative result may occur with  ?improper specimen collection/handling, submission of specimen other ?than nasopharyngeal swab, presence of viral mutation(s) within the ?areas targeted by t

## 2022-03-10 NOTE — Anesthesia Preprocedure Evaluation (Addendum)
Anesthesia Evaluation  ?Patient identified by MRN, date of birth, ID band ?Patient confused ? ?General Assessment Comment:Patient awake and responsive ? ?Reviewed: ?Allergy & Precautions, NPO status , Patient's Chart, lab work & pertinent test results ? ?Airway ?Mallampati: III ? ?TM Distance: >3 FB ?Neck ROM: Full ? ? ? Dental ? ?(+) Missing ?  ?Pulmonary ?former smoker,  ?  ?Pulmonary exam normal ? ? ? ? ? ? ? Cardiovascular ?hypertension, +CHF  ?Normal cardiovascular exam+ dysrhythmias + pacemaker  ? ?ECHO: ?Left ventricular ejection fraction, by estimation, is 40 to 45%. ?  ?Neuro/Psych ?PSYCHIATRIC DISORDERS Anxiety Depression Dementia   ? GI/Hepatic ?negative GI ROS, Neg liver ROS,   ?Endo/Other  ?diabetes, Type 2 ? Renal/GU ?Renal InsufficiencyRenal disease  ? ?  ?Musculoskeletal ?negative musculoskeletal ROS ?(+)  ? Abdominal ?  ?Peds ? Hematology ? ?(+) Blood dyscrasia, anemia ,   ?Anesthesia Other Findings ?LEFT HIP FRACTURE ? Reproductive/Obstetrics ? ?  ? ? ? ? ? ? ? ? ? ? ? ? ? ?  ?  ? ? ? ? ? ? ?Anesthesia Physical ?Anesthesia Plan ? ?ASA: 3 ? ?Anesthesia Plan: General  ? ?Post-op Pain Management:   ? ?Induction: Intravenous ? ?PONV Risk Score and Plan: 2 and Treatment may vary due to age or medical condition, Ondansetron and Dexamethasone ? ?Airway Management Planned: Oral ETT ? ?Additional Equipment:  ? ?Intra-op Plan:  ? ?Post-operative Plan: Extubation in OR ? ?Informed Consent: I have reviewed the patients History and Physical, chart, labs and discussed the procedure including the risks, benefits and alternatives for the proposed anesthesia with the patient or authorized representative who has indicated his/her understanding and acceptance.  ? ?Patient has DNR.  ?Discussed DNR with power of attorney and Suspend DNR. ?  ?Consent reviewed with POA and Dental advisory given ? ?Plan Discussed with: CRNA and Surgeon ? ?Anesthesia Plan Comments:   ? ? ? ? ?Anesthesia  Quick Evaluation ? ?

## 2022-03-10 NOTE — ED Notes (Signed)
Trauma Response Nurse Documentation ? ? ?Kevin [name] is a 76 y.o. male arriving to [facility] ED via EMS ? ?On clopidogrel 75 mg daily. Trauma was activated as a Level 2 by EDP based on the following trauma criteria Discretion of Emergency Department Physician. Activated after patient's arrival/EDP assessment. Patient cleared for CT by Dr. Ronnald Nian. Patient to CT with TRN. GCS 15. ? ?History  ? Past Medical History:  ?Diagnosis Date  ? Agent orange exposure   ? in [place]  ? Anemia   ? Dementia ([place])   ? DNI (do not intubate) 01/21/2022  ? DNR (do not resuscitate) 01/21/2022  ? Heart block AV second degree   ? a. s/p PPM implant with subsequent CRTD upgrade  ? High cholesterol   ? History of colon polyps   ? LBBB (left bundle branch block)   ? Nonischemic cardiomyopathy ([place])   ? a. MDT CRTD upgrade 2016  ? Pacemaker 08/27/2013  ? Dual-chamber Medtronic Adapta implanted January 2013 for bradycardia with alternating bundle branch block and 2:1 atrioventricular block   ? Prostate cancer ([place])   ? "not been treated yet; on a wait and see" (01/22/2015)  ? Type II diabetes mellitus ([place])   ?  ? Past Surgical History:  ?Procedure Laterality Date  ? BASAL CELL CARCINOMA EXCISION    ? "back, face, neck"  ? BI-VENTRICULAR IMPLANTABLE CARDIOVERTER DEFIBRILLATOR UPGRADE N/A 01/22/2015  ? MDT CRTD upgrade by Dr Rayann Heman  ? BIV ICD GENERATOR CHANGEOUT N/A 08/28/2020  ? Procedure: BIV ICD GENERATOR CHANGEOUT;  Surgeon: [name], MD;  Location: [facility];  Service: Cardiovascular;  Laterality: N/A;  ? CARDIAC CATHETERIZATION  01/27/2012  ? normal coronaries, dilated LV c/w nonischemic cardiomyopathy  ? EXAMINATION UNDER ANESTHESIA  04/06/2012  ? Procedure: EXAM UNDER ANESTHESIA;  Surgeon: Joyice Faster. Cornett, MD;  Location: [facility];  Service: General;  Laterality: N/A;  ? INCISION AND DRAINAGE PERIRECTAL ABSCESS  ~ 2010; 2015  ? INGUINAL HERNIA REPAIR Left 1980  ? IR ANGIOGRAM PELVIS SELECTIVE OR SUPRASELECTIVE   10/20/2021  ? IR ANGIOGRAM VISCERAL SELECTIVE  10/20/2021  ? IR EMBO ART  VEN HEMORR LYMPH EXTRAV  INC GUIDE ROADMAPPING  10/20/2021  ? IR US GUIDE VASC ACCESS LEFT  10/20/2021  ? LEFT HEART CATHETERIZATION WITH CORONARY ANGIOGRAM N/A 01/27/2012  ? Procedure: LEFT HEART CATHETERIZATION WITH CORONARY ANGIOGRAM;  Surgeon: Troy Sine, MD;  Location: [facility] C[facility];  Service: Cardiovascular;  Laterality: N/A;  ? NM MYOCAR PERF WALL MOTION  01/21/2012  ? mod-severe perfusion defect due to infarct/scar w/mild perinfarct ischemia in the apical,basal inferoseptal,basal inferior,mid inferoseptal,midinferior and apical inferior regions  ? pacemaker battery    ? PERMANENT PACEMAKER INSERTION N/A 12/01/2011  ? Medtronic implanted by Dr Sallyanne Kuster  ? PROSTATE BIOPSY    ? PROSTATE BIOPSY    ? shrapnel surgery    ? "in [place]"  ? TONSILLECTOMY    ?  ? ? ? ?Initial Focused Assessment (If applicable, or please see trauma documentation): ?See trauma narrator assessment - left shoulder and left hip deformity present ? ?CT's Completed:   ?CT Head and CT C-Spine  ? ?Interventions:  ?CTs as above ?Portable XRAYS chest, left hip/pelvis and left elbow, left shoulder ?IV start and trauma lab draw ?EKG ? ?Plan for disposition:  ?Admission to floor  ? ?Consults completed:  ?Orthopaedic Surgeon at 2240. ?Hospitalist admit ? ?Event Summary: ?Patient arrives via EMS after a mechanical fall in his garage, landing  on concrete. Left hip pain, deformity noted. L shoulder deformity. CMS intact distally. Escorted to CT. Admit pending ortho workup. ? ?MTP Summary (If applicable): NA ? ?Bedside handoff with ED RN Bryson Ha.   ? ?[place]  ?Trauma Response RN ? ?Please call TRN at [phone] for further assistance. ?  ?

## 2022-03-10 NOTE — Anesthesia Procedure Notes (Signed)
Procedure Name: Intubation ?Date/Time: 03/10/2022 5:34 PM ?Performed by: Griffin Dakin, CRNA ?Pre-anesthesia Checklist: Patient identified, Emergency Drugs available, Suction available and Patient being monitored ?Patient Re-evaluated:Patient Re-evaluated prior to induction ?Oxygen Delivery Method: Circle system utilized ?Preoxygenation: Pre-oxygenation with 100% oxygen ?Induction Type: IV induction ?Ventilation: Mask ventilation without difficulty ?Laryngoscope Size: Mac and 4 ?Grade View: Grade I ?Tube type: Oral ?Tube size: 7.5 mm ?Number of attempts: 1 ?Airway Equipment and Method: Stylet and Oral airway ?Placement Confirmation: ETT inserted through vocal cords under direct vision, positive ETCO2 and breath sounds checked- equal and bilateral ?Secured at: 24 cm ?Tube secured with: Tape ?Dental Injury: Teeth and Oropharynx as per pre-operative assessment  ? ? ? ? ?

## 2022-03-10 NOTE — H&P (View-Only) (Signed)
Reason for Consult:Left hip fx ?Referring Physician: Raiford Noble ?Time called: 0820 ?Time at bedside: 0933 ? ? ?Kevin [name] is an 76 y.o. male.  ?HPI: Kevin [name] was in the garage ambulating without his RW when he suffered a syncopal event and fell. When he came to he had severe left shoulder and hip pain and could not get up. He was brought to the ED where x-rays showed humeral and femoral neck fxs and orthopedic surgery was consulted. He lives at home with his wife and is RHD. ? ?Past Medical History:  ?Diagnosis Date  ? Agent orange exposure   ? in [place]  ? Anemia   ? Dementia ([place])   ? DNI (do not intubate) 01/21/2022  ? DNR (do not resuscitate) 01/21/2022  ? Heart block AV second degree   ? a. s/p PPM implant with subsequent CRTD upgrade  ? High cholesterol   ? History of colon polyps   ? LBBB (left bundle branch block)   ? Nonischemic cardiomyopathy ([place])   ? a. MDT CRTD upgrade 2016  ? Pacemaker 08/27/2013  ? Dual-chamber Medtronic Adapta implanted January 2013 for bradycardia with alternating bundle branch block and 2:1 atrioventricular block   ? Prostate cancer ([place])   ? "not been treated yet; on a wait and see" (01/22/2015)  ? Type II diabetes mellitus ([place])   ? ? ?Past Surgical History:  ?Procedure Laterality Date  ? BASAL CELL CARCINOMA EXCISION    ? "back, face, neck"  ? BI-VENTRICULAR IMPLANTABLE CARDIOVERTER DEFIBRILLATOR UPGRADE N/A 01/22/2015  ? MDT CRTD upgrade by Dr Rayann Heman  ? BIV ICD GENERATOR CHANGEOUT N/A 08/28/2020  ? Procedure: BIV ICD GENERATOR CHANGEOUT;  Surgeon: [name], MD;  Location: [facility];  Service: Cardiovascular;  Laterality: N/A;  ? CARDIAC CATHETERIZATION  01/27/2012  ? normal coronaries, dilated LV c/w nonischemic cardiomyopathy  ? EXAMINATION UNDER ANESTHESIA  04/06/2012  ? Procedure: EXAM UNDER ANESTHESIA;  Surgeon: Joyice Faster. Cornett, MD;  Location: [facility];  Service: General;  Laterality: N/A;  ? INCISION AND DRAINAGE PERIRECTAL ABSCESS  ~ 2010; 2015  ?  INGUINAL HERNIA REPAIR Left 1980  ? IR ANGIOGRAM PELVIS SELECTIVE OR SUPRASELECTIVE  10/20/2021  ? IR ANGIOGRAM VISCERAL SELECTIVE  10/20/2021  ? IR EMBO ART  VEN HEMORR LYMPH EXTRAV  INC GUIDE ROADMAPPING  10/20/2021  ? IR US GUIDE VASC ACCESS LEFT  10/20/2021  ? LEFT HEART CATHETERIZATION WITH CORONARY ANGIOGRAM N/A 01/27/2012  ? Procedure: LEFT HEART CATHETERIZATION WITH CORONARY ANGIOGRAM;  Surgeon: Troy Sine, MD;  Location: [facility] C[facility];  Service: Cardiovascular;  Laterality: N/A;  ? NM MYOCAR PERF WALL MOTION  01/21/2012  ? mod-severe perfusion defect due to infarct/scar w/mild perinfarct ischemia in the apical,basal inferoseptal,basal inferior,mid inferoseptal,midinferior and apical inferior regions  ? pacemaker battery    ? PERMANENT PACEMAKER INSERTION N/A 12/01/2011  ? Medtronic implanted by Dr Sallyanne Kuster  ? PROSTATE BIOPSY    ? PROSTATE BIOPSY    ? shrapnel surgery    ? "in [place]"  ? TONSILLECTOMY    ? ? ?Family History  ?Problem Relation Age of Onset  ? Ovarian cancer Mother   ? Cancer Mother   ?     ?uterine  ? Leukemia Father   ? Heart disease Father   ? Diabetes Neg Hx   ? ? ?Social History:  reports that he quit smoking about 37 years ago. His smoking use included cigarettes. He has a 20.00 pack-year smoking history. He has  never used smokeless tobacco. He reports current alcohol use. He reports that he does not use drugs. ? ?Allergies:  ?Allergies  ?Allergen Reactions  ? Sulfa Antibiotics Swelling  ?  Swelling of lips only.  ? 5-Alpha Reductase Inhibitors   ?  Patient and wife state OK for patient to take finasteride - has not taken it before, NKA  ? ? ?Medications: I have reviewed the patient's current medications. ? ?Results for orders placed or performed during the hospital encounter of 03/09/22 (from the past 48 hour(s))  ?Type and screen [place]     Status: None  ? Collection Time: 03/09/22  9:25 PM  ?Result Value Ref Range  ? ABO/RH(D) A POS   ? Antibody Screen NEG   ?  Sample Expiration    ?  03/12/2022,2359 ?Performed at [facility], [place] [address] [place], [place] [address] ?  ?Basic metabolic panel     Status: Abnormal  ? Collection Time: 03/09/22  9:39 PM  ?Result Value Ref Range  ? Sodium 141 135 - 145 mmol/L  ? Potassium 4.3 3.5 - 5.1 mmol/L  ? Chloride 109 98 - 111 mmol/L  ? CO2 25 22 - 32 mmol/L  ? Glucose, Bld 180 (H) 70 - 99 mg/dL  ?  Comment: Glucose reference range applies only to samples taken after fasting for at least 8 hours.  ? BUN 16 8 - 23 mg/dL  ? Creatinine, Ser 1.45 (H) 0.61 - 1.24 mg/dL  ? Calcium 9.1 8.9 - 10.3 mg/dL  ? GFR, Estimated 50 (L) >60 mL/min  ?  Comment: (NOTE) ?Calculated using the CKD-EPI Creatinine Equation (2021) ?  ? Anion gap 7 5 - 15  ?  Comment: Performed at [facility], [place] [address], [place], [place] [address]  ?CBC with Differential     Status: Abnormal  ? Collection Time: 03/09/22  9:39 PM  ?Result Value Ref Range  ? WBC 10.2 4.0 - 10.5 K/uL  ? RBC 3.61 (L) 4.22 - 5.81 MIL/uL  ? Hemoglobin 12.1 (L) 13.0 - 17.0 g/dL  ? HCT 38.7 (L) 39.0 - 52.0 %  ? MCV 107.2 (H) 80.0 - 100.0 fL  ? MCH 33.5 26.0 - 34.0 pg  ? MCHC 31.3 30.0 - 36.0 g/dL  ? RDW 13.2 11.5 - 15.5 %  ? Platelets 179 150 - 400 K/uL  ? nRBC 0.0 0.0 - 0.2 %  ? Neutrophils Relative % 80 %  ? Neutro Abs 8.2 (H) 1.7 - 7.7 K/uL  ? Lymphocytes Relative 12 %  ? Lymphs Abs 1.2 0.7 - 4.0 K/uL  ? Monocytes Relative 6 %  ? Monocytes Absolute 0.7 0.1 - 1.0 K/uL  ? Eosinophils Relative 1 %  ? Eosinophils Absolute 0.1 0.0 - 0.5 K/uL  ? Basophils Relative 0 %  ? Basophils Absolute 0.0 0.0 - 0.1 K/uL  ? Immature Granulocytes 1 %  ? Abs Immature Granulocytes 0.07 0.00 - 0.07 K/uL  ?  Comment: Performed at [facility], [place] [address] [place], [place] [address]  ?Protime-INR     Status: None  ? Collection Time: 03/09/22  9:39 PM  ?Result Value Ref Range  ? Prothrombin Time 12.9 11.4 - 15.2 seconds  ? INR 1.0 0.8 - 1.2  ?  Comment: (NOTE) ?INR goal varies based on device  and disease states. ?Performed at [facility], [place] [address], [place], [place] ?[address] ?  ?Resp Panel by RT-PCR (Flu  A&B, Covid) Nasopharyngeal Swab     Status: None  ? Collection Time: 03/09/22 10:01 PM  ? Specimen: Nasopharyngeal Swab; Nasopharyngeal(NP) swabs in vial transport medium  ?Result Value Ref Range  ? SARS Coronavirus 2 by RT PCR NEGATIVE NEGATIVE  ?  Comment: (NOTE) ?SARS-CoV-2 target nucleic acids are NOT DETECTED. ? ?The SARS-CoV-2 RNA is generally detectable in upper respiratory ?specimens during the acute phase of infection. The lowest ?concentration of SARS-CoV-2 viral copies this assay can detect is ?138 copies/mL. A negative result does not preclude SARS-Cov-2 ?infection and should not be used as the sole basis for treatment or ?other patient management decisions. A negative result may occur with  ?improper specimen collection/handling, submission of specimen other ?than nasopharyngeal swab, presence of viral mutation(s) within the ?areas targeted by this assay, and inadequate number of viral ?copies(<138 copies/mL). A negative result must be combined with ?clinical observations, patient history, and epidemiological ?information. The expected result is Negative. ? ?Fact Sheet for Patients:  ?EntrepreneurPulse.com.au ? ?Fact Sheet for Healthcare Providers:  ?IncredibleEmployment.be ? ?This test is no t yet approved or cleared by the [place] FDA and  ?has been authorized for detection and/or diagnosis of SARS-CoV-2 by ?FDA under an Emergency Use Authorization (EUA). This EUA will remain  ?in effect (meaning this test can be used) for the duration of the ?COVID-19 declaration under Section 564(b)(1) of the Act, 21 ?U.S.C.section 360bbb-3(b)(1), unless the authorization is terminated  ?or revoked sooner.  ? ? ?  ? Influenza A by PCR NEGATIVE NEGATIVE  ? Influenza B by PCR NEGATIVE NEGATIVE  ?  Comment: (NOTE) ?The Xpert Xpress SARS-CoV-2/FLU/RSV  plus assay is intended as an aid ?in the diagnosis of influenza from Nasopharyngeal swab specimens and ?should not be used as a sole basis for treatment. Nasal washings and ?aspirates are unacceptable for Xpert Xp

## 2022-03-10 NOTE — Anesthesia Postprocedure Evaluation (Signed)
Anesthesia Post Note ? ?Patient: Kevin [name] ? ?Procedure(s) Performed: AN AD HOC NERVE BLOCK ? ?  ? ?Patient location during evaluation: PACU ?Anesthesia Type: Regional ?Level of consciousness: awake and alert ?Pain management: pain level controlled ?Vital Signs Assessment: post-procedure vital signs reviewed and stable ?Respiratory status: spontaneous breathing, nonlabored ventilation and respiratory function stable ?Cardiovascular status: blood pressure returned to baseline and stable ?Postop Assessment: no apparent nausea or vomiting ?Anesthetic complications: no ? ? ?No notable events documented. ? ?Last Vitals:  ?Vitals:  ? 03/10/22 0945 03/10/22 0951  ?BP:  (!) 142/78  ?Pulse: 95 95  ?Resp:  20  ?Temp:    ?SpO2:  99%  ?  ?Last Pain:  ?Vitals:  ? 03/10/22 0759  ?TempSrc:   ?PainSc: 8   ? ? ?  ?  ?  ?  ?  ?  ? ?[name] ? ? ? ? ?

## 2022-03-10 NOTE — Anesthesia Procedure Notes (Signed)
Anesthesia Regional Block: Peng block  ? ?Pre-Anesthetic Checklist: , timeout performed,  Correct Patient, Correct Site, Correct Laterality,  Correct Procedure, Correct Position, site marked,  Risks and benefits discussed,  Surgical consent,  Pre-op evaluation,  At surgeon's request and post-op pain management ? ?Laterality: Left ? ?Prep: Maximum Sterile Barrier Precautions used, chloraprep     ?  ?Needles:  ?Injection technique: Single-shot ? ?Needle Type: Echogenic Stimulator Needle   ? ? ?Needle Length: 9cm  ?Needle Gauge: 22  ? ? ? ?Additional Needles: ? ? ?Procedures:,,,, ultrasound used (permanent image in chart),,    ?Narrative:  ?Start time: 03/10/2022 9:30 AM ?End time: 03/10/2022 9:35 AM ?Injection made incrementally with aspirations every 5 mL. ? ?Performed by: Personally  ?Anesthesiologist: Pervis Hocking, DO ? ?Additional Notes: ?Monitors applied. No increased pain on injection. No increased resistance to injection. Injection made in 5cc increments. Good needle visualization. Patient tolerated procedure well.  ? ? ? ? ?

## 2022-03-10 NOTE — TOC CAGE-AID Note (Signed)
Transition of Care (TOC) - CAGE-AID Screening ? ? ?Patient Details  ?Name: RIGEL FILSINGER ?MRN: [id number] ?Date of Birth: Jun 08, 1946 ? ?Transition of Care (TOC) CM/SW Contact:    ?Jacori Mulrooney C Tarpley-Carter, LCSWA ?Phone Number: ?03/10/2022, 11:14 AM ? ? ?Clinical Narrative: ?Pt participated in [place].  Per pts wife, he does not use substance or ETOH.  Pt is experiencing dementia.  Pt was not offered resources, due to no usage of substance or ETOH.    ? ?Passenger transport manager, MSW, LCSW-A ?Pronouns:  She/Her/Hers ?[facility] HealthTransitions of Care ?Clinical Social Worker ?Direct Number:  [phone] ?[email or web address] ? ?CAGE-AID Screening: ?  ? ?Have You Ever Felt You Ought to Cut Down on Your Drinking or Drug Use?: No ?Have People Annoyed You By Critizing Your Drinking Or Drug Use?: No ?Have You Felt Bad Or Guilty About Your Drinking Or Drug Use?: No ?Have You Ever Had a Drink or Used Drugs First Thing In The Morning to Steady Your Nerves or to Get Rid of a Hangover?: No ?CAGE-AID Score: 0 ? ?Substance Abuse Education Offered: No ? ?  ? ? ? ? ? ? ?

## 2022-03-10 NOTE — Transfer of Care (Signed)
Immediate Anesthesia Transfer of Care Note ? ?Patient: Kevin [name] ? ?Procedure(s) Performed: ARTHROPLASTY BIPOLAR HIP (HEMIARTHROPLASTY) (Left: Hip) ? ?Patient Location: PACU ? ?Anesthesia Type:General ? ?Level of Consciousness: , oriented to person and time. ? ?Airway & Oxygen Therapy: Patient Spontanous Breathing and Patient connected to nasal cannula oxygen ? ?Post-op Assessment: Report given to RN and Post -op Vital signs reviewed and stable ? ?Post vital signs: Reviewed and stable ? ?Last Vitals:  ?Vitals Value Taken Time  ?BP 153/81 03/10/22 1941  ?Temp    ?Pulse 103 03/10/22 1948  ?Resp 19 03/10/22 1948  ?SpO2 99 % 03/10/22 1948  ?Vitals shown include unvalidated device data. ? ?Last Pain:  ?Vitals:  ? 03/10/22 1653  ?TempSrc: Oral  ?PainSc:   ?   ? ?  ? ?Complications: No notable events documented. Discussed AICD interrogation with Dr. Ermalene Postin, recommendations passed on to PACU to contact device rep. ?

## 2022-03-10 NOTE — Hospital Course (Addendum)
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  Patient states that he tripped on his trash can and the wife states that he usually uses a walker but did not do so at the time when he fell.  He did not hit his head and did not lose consciousness but after his fall he had pain in the left shoulder and left hip is brought to the ED.  X-rays in the ED showed a left hip fracture and a left proximal humerus fracture.  Orthopedic surgery was consulted and the plan is for surgical intervention of his left hip and this was done by [name] on 03/10/22 and he underwent left proximal humerus fractures s/p ORIF by [name] on 03/11/22.  PT OT evaluated and recommending CIR and he was screened and appears to be a potential candidate for CIR.  Per orthopedic surgery [name] he is to be weightbearing as tolerated left lower extremity with posterior hip precautions for 6 weeks and recommending Plavix and Lovenox for 4 weeks after surgery however given his acute drop from hemoglobin his Lovenox was held.  Given that his blood count dropped a little bit orthopedic surgery also type and screen and transfuse 1 unit of PRBCs.  Per orthopedic surgery he has to be nonweightbearing in the left upper extremity but can use a platform walker to assist with mobilization and have passive shoulder abduction but no active shoulder abduction with a sling for comfort. ? ?His Blood Count is improved and Hgb/Hct is now 9.4/28.9 yesterday and today is 9.2/29.4 so Ortho is resuming Lovenox today at 40 mg q24h. The rest of his labs are stable. Patient wanting to go home but remains a little confused due to  his dementia. Will pursue CIR workup at [facility].  ? ?He is now having issues with Acute Urinary Retention. Will I and O x3 for >300 mL and if he continues to retain will place foley catheter. Foley was placed 03/15/22 given continued retention. ? ?Overall his hemoglobin and his renal function remained stable.  PT OT recommending CIR and he has insurance authorization and will D/C today since bed is available.  He was medically stable to be discharged at this time as of 03/15/22.  ?

## 2022-03-10 NOTE — Addendum Note (Signed)
Addendum  created 03/10/22 1045 by Pervis Hocking, DO  ? Child order released for a procedure order, Clinical Note Signed, Image imported, Intraprocedure Blocks edited, SmartForm saved  ?  ?

## 2022-03-10 NOTE — Progress Notes (Signed)
Report called to [facility] RN, made aware of 02 2l due to administration of fentanyl 100 mcg given saturations 98  ?

## 2022-03-10 NOTE — Interval H&P Note (Signed)
The patient has been re-examined, and the chart reviewed, and there have been no interval changes to the documented history and physical.   ? ?Patient with left displaced femoral neck fracture and left displaced proximal humerus fracture.  Given patient's age, health, baseline function, and comorbidities feel that he is a good candidate for hemiarthroplasty for the femoral neck fracture. He also has Dorr C femur bone so will plan for cemented hip hemiarthroplasty.  Discussed with trauma colleagues and will plan to address to the humerus fracture in a staged fashion either tomorrow or Friday.  Plan for left hip hemiarthroplasty today. ? ?The operative side was examined and the patient was confirmed to have. Sens DPN, SPN, TN intact, Motor EHL, ext, flex 5/5, and DP 2+, PT 2+, No significant edema. ? ? ?The risks benefits and alternatives were discussed with the patient including but not limited to the risks of nonoperative treatment, versus surgical intervention including infection, bleeding, nerve injury, periprosthetic fracture, the need for revision surgery, dislocation, leg length discrepancy, blood clots, cardiopulmonary complications, morbidity, mortality, among others, and they were willing to proceed.   Consent was signed by myself and the patient.  Left leg was marked.  ? ?

## 2022-03-10 NOTE — ED Notes (Signed)
Pt has 2+ left radial pulse, cap refill ess than 3 sec, 5/5 left grip strength, warm to touch. Pt has 2+ left pedal pulse, cap refill less than 3 sec, pt able to wiggle toes, warm to touch. ?

## 2022-03-10 NOTE — Consult Note (Signed)
Reason for Consult:Left hip fx ?Referring Physician: Raiford Noble ?Time called: 0820 ?Time at bedside: 0933 ? ? ?Kevin [name] is an 76 y.o. male.  ?HPI: Kevin [name] was in the garage ambulating without his RW when he suffered a syncopal event and fell. When he came to he had severe left shoulder and hip pain and could not get up. He was brought to the ED where x-rays showed humeral and femoral neck fxs and orthopedic surgery was consulted. He lives at home with his wife and is RHD. ? ?Past Medical History:  ?Diagnosis Date  ? Agent orange exposure   ? in [place]  ? Anemia   ? Dementia ([place])   ? DNI (do not intubate) 01/21/2022  ? DNR (do not resuscitate) 01/21/2022  ? Heart block AV second degree   ? a. s/p PPM implant with subsequent CRTD upgrade  ? High cholesterol   ? History of colon polyps   ? LBBB (left bundle branch block)   ? Nonischemic cardiomyopathy ([place])   ? a. MDT CRTD upgrade 2016  ? Pacemaker 08/27/2013  ? Dual-chamber Medtronic Adapta implanted January 2013 for bradycardia with alternating bundle branch block and 2:1 atrioventricular block   ? Prostate cancer ([place])   ? "not been treated yet; on a wait and see" (01/22/2015)  ? Type II diabetes mellitus ([place])   ? ? ?Past Surgical History:  ?Procedure Laterality Date  ? BASAL CELL CARCINOMA EXCISION    ? "back, face, neck"  ? BI-VENTRICULAR IMPLANTABLE CARDIOVERTER DEFIBRILLATOR UPGRADE N/A 01/22/2015  ? MDT CRTD upgrade by Dr Rayann Heman  ? BIV ICD GENERATOR CHANGEOUT N/A 08/28/2020  ? Procedure: BIV ICD GENERATOR CHANGEOUT;  Surgeon: [name], MD;  Location: [facility];  Service: Cardiovascular;  Laterality: N/A;  ? CARDIAC CATHETERIZATION  01/27/2012  ? normal coronaries, dilated LV c/w nonischemic cardiomyopathy  ? EXAMINATION UNDER ANESTHESIA  04/06/2012  ? Procedure: EXAM UNDER ANESTHESIA;  Surgeon: Joyice Faster. Cornett, MD;  Location: [facility];  Service: General;  Laterality: N/A;  ? INCISION AND DRAINAGE PERIRECTAL ABSCESS  ~ 2010; 2015  ?  INGUINAL HERNIA REPAIR Left 1980  ? IR ANGIOGRAM PELVIS SELECTIVE OR SUPRASELECTIVE  10/20/2021  ? IR ANGIOGRAM VISCERAL SELECTIVE  10/20/2021  ? IR EMBO ART  VEN HEMORR LYMPH EXTRAV  INC GUIDE ROADMAPPING  10/20/2021  ? IR US GUIDE VASC ACCESS LEFT  10/20/2021  ? LEFT HEART CATHETERIZATION WITH CORONARY ANGIOGRAM N/A 01/27/2012  ? Procedure: LEFT HEART CATHETERIZATION WITH CORONARY ANGIOGRAM;  Surgeon: Troy Sine, MD;  Location: [facility] CATH [facility];  Service: Cardiovascular;  Laterality: N/A;  ? NM MYOCAR PERF WALL MOTION  01/21/2012  ? mod-severe perfusion defect due to infarct/scar w/mild perinfarct ischemia in the apical,basal inferoseptal,basal inferior,mid inferoseptal,midinferior and apical inferior regions  ? pacemaker battery    ? PERMANENT PACEMAKER INSERTION N/A 12/01/2011  ? Medtronic implanted by Dr Sallyanne Kuster  ? PROSTATE BIOPSY    ? PROSTATE BIOPSY    ? shrapnel surgery    ? "in [place]"  ? TONSILLECTOMY    ? ? ?Family History  ?Problem Relation Age of Onset  ? Ovarian cancer Mother   ? Cancer Mother   ?     ?uterine  ? Leukemia Father   ? Heart disease Father   ? Diabetes Neg Hx   ? ? ?Social History:  reports that he quit smoking about 37 years ago. His smoking use included cigarettes. He has a 20.00 pack-year smoking history. He has  never used smokeless tobacco. He reports current alcohol use. He reports that he does not use drugs. ? ?Allergies:  ?Allergies  ?Allergen Reactions  ? Sulfa Antibiotics Swelling  ?  Swelling of lips only.  ? 5-Alpha Reductase Inhibitors   ?  Patient and wife state OK for patient to take finasteride - has not taken it before, NKA  ? ? ?Medications: I have reviewed the patient's current medications. ? ?Results for orders placed or performed during the hospital encounter of 03/09/22 (from the past 48 hour(s))  ?Type and screen [place]     Status: None  ? Collection Time: 03/09/22  9:25 PM  ?Result Value Ref Range  ? ABO/RH(D) A POS   ? Antibody Screen NEG   ?  Sample Expiration    ?  03/12/2022,2359 ?Performed at [facility], [place] [address] [place], [place] [address] ?  ?Basic metabolic panel     Status: Abnormal  ? Collection Time: 03/09/22  9:39 PM  ?Result Value Ref Range  ? Sodium 141 135 - 145 mmol/L  ? Potassium 4.3 3.5 - 5.1 mmol/L  ? Chloride 109 98 - 111 mmol/L  ? CO2 25 22 - 32 mmol/L  ? Glucose, Bld 180 (H) 70 - 99 mg/dL  ?  Comment: Glucose reference range applies only to samples taken after fasting for at least 8 hours.  ? BUN 16 8 - 23 mg/dL  ? Creatinine, Ser 1.45 (H) 0.61 - 1.24 mg/dL  ? Calcium 9.1 8.9 - 10.3 mg/dL  ? GFR, Estimated 50 (L) >60 mL/min  ?  Comment: (NOTE) ?Calculated using the CKD-EPI Creatinine Equation (2021) ?  ? Anion gap 7 5 - 15  ?  Comment: Performed at [facility], [place] [address] [place], [place] [address]  ?CBC with Differential     Status: Abnormal  ? Collection Time: 03/09/22  9:39 PM  ?Result Value Ref Range  ? WBC 10.2 4.0 - 10.5 K/uL  ? RBC 3.61 (L) 4.22 - 5.81 MIL/uL  ? Hemoglobin 12.1 (L) 13.0 - 17.0 g/dL  ? HCT 38.7 (L) 39.0 - 52.0 %  ? MCV 107.2 (H) 80.0 - 100.0 fL  ? MCH 33.5 26.0 - 34.0 pg  ? MCHC 31.3 30.0 - 36.0 g/dL  ? RDW 13.2 11.5 - 15.5 %  ? Platelets 179 150 - 400 K/uL  ? nRBC 0.0 0.0 - 0.2 %  ? Neutrophils Relative % 80 %  ? Neutro Abs 8.2 (H) 1.7 - 7.7 K/uL  ? Lymphocytes Relative 12 %  ? Lymphs Abs 1.2 0.7 - 4.0 K/uL  ? Monocytes Relative 6 %  ? Monocytes Absolute 0.7 0.1 - 1.0 K/uL  ? Eosinophils Relative 1 %  ? Eosinophils Absolute 0.1 0.0 - 0.5 K/uL  ? Basophils Relative 0 %  ? Basophils Absolute 0.0 0.0 - 0.1 K/uL  ? Immature Granulocytes 1 %  ? Abs Immature Granulocytes 0.07 0.00 - 0.07 K/uL  ?  Comment: Performed at [facility], [place] [address] [place], [place] [address]  ?Protime-INR     Status: None  ? Collection Time: 03/09/22  9:39 PM  ?Result Value Ref Range  ? Prothrombin Time 12.9 11.4 - 15.2 seconds  ? INR 1.0 0.8 - 1.2  ?  Comment: (NOTE) ?INR goal varies based on device  and disease states. ?Performed at [facility], [place] [address], [place], [place] ?[address] ?  ?Resp Panel by RT-PCR (Flu  A&B, Covid) Nasopharyngeal Swab     Status: None  ? Collection Time: 03/09/22 10:01 PM  ? Specimen: Nasopharyngeal Swab; Nasopharyngeal(NP) swabs in vial transport medium  ?Result Value Ref Range  ? SARS Coronavirus 2 by RT PCR NEGATIVE NEGATIVE  ?  Comment: (NOTE) ?SARS-CoV-2 target nucleic acids are NOT DETECTED. ? ?The SARS-CoV-2 RNA is generally detectable in upper respiratory ?specimens during the acute phase of infection. The lowest ?concentration of SARS-CoV-2 viral copies this assay can detect is ?138 copies/mL. A negative result does not preclude SARS-Cov-2 ?infection and should not be used as the sole basis for treatment or ?other patient management decisions. A negative result may occur with  ?improper specimen collection/handling, submission of specimen other ?than nasopharyngeal swab, presence of viral mutation(s) within the ?areas targeted by this assay, and inadequate number of viral ?copies(<138 copies/mL). A negative result must be combined with ?clinical observations, patient history, and epidemiological ?information. The expected result is Negative. ? ?Fact Sheet for Patients:  ?EntrepreneurPulse.com.au ? ?Fact Sheet for Healthcare Providers:  ?IncredibleEmployment.be ? ?This test is no t yet approved or cleared by the [place] FDA and  ?has been authorized for detection and/or diagnosis of SARS-CoV-2 by ?FDA under an Emergency Use Authorization (EUA). This EUA will remain  ?in effect (meaning this test can be used) for the duration of the ?COVID-19 declaration under Section 564(b)(1) of the Act, 21 ?U.S.C.section 360bbb-3(b)(1), unless the authorization is terminated  ?or revoked sooner.  ? ? ?  ? Influenza A by PCR NEGATIVE NEGATIVE  ? Influenza B by PCR NEGATIVE NEGATIVE  ?  Comment: (NOTE) ?The Xpert Xpress SARS-CoV-2/FLU/RSV  plus assay is intended as an aid ?in the diagnosis of influenza from Nasopharyngeal swab specimens and ?should not be used as a sole basis for treatment. Nasal washings and ?aspirates are unacceptable for Xpert Xp

## 2022-03-10 NOTE — Anesthesia Preprocedure Evaluation (Addendum)
Anesthesia Evaluation  ?Patient identified by MRN, date of birth, ID band ?Patient awake ? ? ? ?Reviewed: ?Allergy & Precautions, NPO status , Patient's Chart, lab work & pertinent test results ? ?Airway ?Mallampati: II ? ?TM Distance: >3 FB ?Neck ROM: Full ? ? ? Dental ?no notable dental hx. ? ?  ?Pulmonary ?neg pulmonary ROS, former smoker,  ?  ?Pulmonary exam normal ?breath sounds clear to auscultation ? ? ? ? ? ? Cardiovascular ?hypertension, Pt. on medications ?+CHF (mild RV dysfunction, LVEF 45%)  ?Normal cardiovascular exam+ dysrhythmias + pacemaker + Valvular Problems/Murmurs (mild MR) MR  ?Rhythm:Regular Rate:Normal ? ?Echo 2021 ??1. Left ventricular ejection fraction, by estimation, is 40 to 45%. The  ?left ventricle has mildly decreased function. The left ventricle  ?demonstrates global hypokinesis. There is mild concentric left ventricular  ?hypertrophy. Left ventricular diastolic  ?parameters are consistent with Grade I diastolic dysfunction (impaired  ?relaxation).  ??2. Right ventricular systolic function is mildly reduced. The right  ?ventricular size is normal. Tricuspid regurgitation signal is inadequate  ?for assessing PA pressure.  ??3. The mitral valve is normal in structure. Mild mitral valve  ?regurgitation. No evidence of mitral stenosis.  ??4. The aortic valve is normal in structure. Aortic valve regurgitation is  ?not visualized. No aortic stenosis is present.  ??5. The inferior vena cava is normal in size with greater than 50%  ?respiratory variability, suggesting right atrial pressure of 3 mmHg.  ?  ?Neuro/Psych ?PSYCHIATRIC DISORDERS Anxiety Depression Dementia negative neurological ROS ?   ? GI/Hepatic ?negative GI ROS, Neg liver ROS,   ?Endo/Other  ?diabetes, Well Controlled, Type 2 ? Renal/GU ?negative Renal ROS  ?negative genitourinary ?  ?Musculoskeletal ?L fem neck fx  ? Abdominal ?  ?Peds ?negative pediatric ROS ?(+)  Hematology ?negative  hematology ROS ?(+)   ?Anesthesia Other Findings ?plavix ? Reproductive/Obstetrics ?negative OB ROS ? ?  ? ? ? ? ? ? ? ? ? ? ? ? ? ?  ?  ? ? ? ? ? ? ? ?Anesthesia Physical ?Anesthesia Plan ? ?ASA: 3 ? ?Anesthesia Plan: Regional  ? ?Post-op Pain Management: Regional block*  ? ?Induction:  ? ?PONV Risk Score and Plan: 2 and Treatment may vary due to age or medical condition ? ?Airway Management Planned: Natural Airway ? ?Additional Equipment: None ? ?Intra-op Plan:  ? ?Post-operative Plan:  ? ?Informed Consent: I have reviewed the patients History and Physical, chart, labs and discussed the procedure including the risks, benefits and alternatives for the proposed anesthesia with the patient or authorized representative who has indicated his/her understanding and acceptance.  ? ?Patient has DNR.  ?Continue DNR. ?  ?Consent reviewed with POA ? ?Plan Discussed with: CRNA ? ?Anesthesia Plan Comments: (Wife at bedside but patient alert and oriented, both consented to nerve block )  ? ? ? ? ? ?Anesthesia Quick Evaluation ? ?

## 2022-03-10 NOTE — ED Notes (Signed)
Patient transported to PACU.

## 2022-03-10 NOTE — ED Notes (Signed)
Patient transported to X-ray 

## 2022-03-11 ENCOUNTER — Inpatient Hospital Stay (HOSPITAL_COMMUNITY): Payer: No Typology Code available for payment source | Admitting: Anesthesiology

## 2022-03-11 ENCOUNTER — Encounter (HOSPITAL_COMMUNITY)
Admission: EM | Disposition: A | Discharge status: Rehabilitation Facility | Payer: Self-pay | Source: Home / Self Care | Attending: Internal Medicine

## 2022-03-11 ENCOUNTER — Inpatient Hospital Stay (HOSPITAL_COMMUNITY): Payer: No Typology Code available for payment source

## 2022-03-11 ENCOUNTER — Encounter: Payer: Self-pay | Admitting: Internal Medicine

## 2022-03-11 ENCOUNTER — Other Ambulatory Visit: Payer: Medicare HMO | Admitting: Family Medicine

## 2022-03-11 ENCOUNTER — Encounter (HOSPITAL_COMMUNITY): Payer: Self-pay | Admitting: Orthopedic Surgery

## 2022-03-11 DIAGNOSIS — I5042 Chronic combined systolic (congestive) and diastolic (congestive) heart failure: Secondary | ICD-10-CM | POA: Diagnosis not present

## 2022-03-11 DIAGNOSIS — S72042A Displaced fracture of base of neck of left femur, initial encounter for closed fracture: Secondary | ICD-10-CM

## 2022-03-11 DIAGNOSIS — Z87891 Personal history of nicotine dependence: Secondary | ICD-10-CM

## 2022-03-11 DIAGNOSIS — I495 Sick sinus syndrome: Secondary | ICD-10-CM | POA: Diagnosis not present

## 2022-03-11 DIAGNOSIS — Z96642 Presence of left artificial hip joint: Secondary | ICD-10-CM

## 2022-03-11 DIAGNOSIS — I110 Hypertensive heart disease with heart failure: Secondary | ICD-10-CM

## 2022-03-11 DIAGNOSIS — E119 Type 2 diabetes mellitus without complications: Secondary | ICD-10-CM

## 2022-03-11 DIAGNOSIS — S42292A Other displaced fracture of upper end of left humerus, initial encounter for closed fracture: Secondary | ICD-10-CM

## 2022-03-11 DIAGNOSIS — S42212A Unspecified displaced fracture of surgical neck of left humerus, initial encounter for closed fracture: Secondary | ICD-10-CM

## 2022-03-11 DIAGNOSIS — I5041 Acute combined systolic (congestive) and diastolic (congestive) heart failure: Secondary | ICD-10-CM

## 2022-03-11 DIAGNOSIS — I509 Heart failure, unspecified: Secondary | ICD-10-CM

## 2022-03-11 HISTORY — PX: ORIF SHOULDER FRACTURE: SHX5035

## 2022-03-11 LAB — GLUCOSE, CAPILLARY
Glucose-Capillary: 100 mg/dL — ABNORMAL HIGH (ref 70–99)
Glucose-Capillary: 112 mg/dL — ABNORMAL HIGH (ref 70–99)
Glucose-Capillary: 119 mg/dL — ABNORMAL HIGH (ref 70–99)
Glucose-Capillary: 128 mg/dL — ABNORMAL HIGH (ref 70–99)
Glucose-Capillary: 140 mg/dL — ABNORMAL HIGH (ref 70–99)
Glucose-Capillary: 158 mg/dL — ABNORMAL HIGH (ref 70–99)
Glucose-Capillary: 183 mg/dL — ABNORMAL HIGH (ref 70–99)
Glucose-Capillary: 187 mg/dL — ABNORMAL HIGH (ref 70–99)
Glucose-Capillary: 280 mg/dL — ABNORMAL HIGH (ref 70–99)

## 2022-03-11 LAB — CBC WITH DIFFERENTIAL/PLATELET
Abs Immature Granulocytes: 0.07 10*3/uL (ref 0.00–0.07)
Basophils Absolute: 0 10*3/uL (ref 0.0–0.1)
Basophils Relative: 0 %
Eosinophils Absolute: 0 10*3/uL (ref 0.0–0.5)
Eosinophils Relative: 0 %
HCT: 30.6 % — ABNORMAL LOW (ref 39.0–52.0)
Hemoglobin: 9.9 g/dL — ABNORMAL LOW (ref 13.0–17.0)
Immature Granulocytes: 1 %
Lymphocytes Relative: 5 %
Lymphs Abs: 0.7 10*3/uL (ref 0.7–4.0)
MCH: 33.4 pg (ref 26.0–34.0)
MCHC: 32.4 g/dL (ref 30.0–36.0)
MCV: 103.4 fL — ABNORMAL HIGH (ref 80.0–100.0)
Monocytes Absolute: 1.1 10*3/uL — ABNORMAL HIGH (ref 0.1–1.0)
Monocytes Relative: 8 %
Neutro Abs: 11.8 10*3/uL — ABNORMAL HIGH (ref 1.7–7.7)
Neutrophils Relative %: 86 %
Platelets: 142 10*3/uL — ABNORMAL LOW (ref 150–400)
RBC: 2.96 MIL/uL — ABNORMAL LOW (ref 4.22–5.81)
RDW: 13.2 % (ref 11.5–15.5)
WBC: 13.7 10*3/uL — ABNORMAL HIGH (ref 4.0–10.5)
nRBC: 0 % (ref 0.0–0.2)

## 2022-03-11 LAB — COMPREHENSIVE METABOLIC PANEL
ALT: 9 U/L (ref 0–44)
AST: 22 U/L (ref 15–41)
Albumin: 3.2 g/dL — ABNORMAL LOW (ref 3.5–5.0)
Alkaline Phosphatase: 68 U/L (ref 38–126)
Anion gap: 8 (ref 5–15)
BUN: 25 mg/dL — ABNORMAL HIGH (ref 8–23)
CO2: 24 mmol/L (ref 22–32)
Calcium: 8.8 mg/dL — ABNORMAL LOW (ref 8.9–10.3)
Chloride: 107 mmol/L (ref 98–111)
Creatinine, Ser: 1.4 mg/dL — ABNORMAL HIGH (ref 0.61–1.24)
GFR, Estimated: 52 mL/min — ABNORMAL LOW (ref >60)
Glucose, Bld: 163 mg/dL — ABNORMAL HIGH (ref 70–99)
Potassium: 5.3 mmol/L — ABNORMAL HIGH (ref 3.5–5.1)
Sodium: 139 mmol/L (ref 135–145)
Total Bilirubin: 0.7 mg/dL (ref 0.3–1.2)
Total Protein: 5.9 g/dL — ABNORMAL LOW (ref 6.5–8.1)

## 2022-03-11 LAB — PHOSPHORUS: Phosphorus: 4 mg/dL (ref 2.5–4.6)

## 2022-03-11 LAB — MAGNESIUM: Magnesium: 2 mg/dL (ref 1.7–2.4)

## 2022-03-11 SURGERY — OPEN REDUCTION INTERNAL FIXATION (ORIF) SHOULDER FRACTURE
Anesthesia: General | Site: Shoulder | Laterality: Left

## 2022-03-11 MED ORDER — SUGAMMADEX SODIUM 200 MG/2ML IV SOLN
INTRAVENOUS | Status: DC | PRN
  Administered 2022-03-11: 200 mg via INTRAVENOUS

## 2022-03-11 MED ORDER — DEXAMETHASONE SODIUM PHOSPHATE 10 MG/ML IJ SOLN
INTRAMUSCULAR | Status: AC
  Filled 2022-03-11: qty 1

## 2022-03-11 MED ORDER — FENTANYL CITRATE (PF) 100 MCG/2ML IJ SOLN: 25.0000 ug | INTRAMUSCULAR | Status: DC | PRN

## 2022-03-11 MED ORDER — PHENYLEPHRINE HCL-NACL 20-0.9 MG/250ML-% IV SOLN
INTRAVENOUS | Status: DC | PRN
  Administered 2022-03-11: 40 ug/min via INTRAVENOUS

## 2022-03-11 MED ORDER — PROMETHAZINE HCL 25 MG/ML IJ SOLN: 6.2500 mg | INTRAMUSCULAR | Status: DC | PRN

## 2022-03-11 MED ORDER — ACETAMINOPHEN 500 MG PO TABS: 1000.0000 mg | ORAL_TABLET | Freq: Once | ORAL | Status: AC

## 2022-03-11 MED ORDER — CEFAZOLIN SODIUM-DEXTROSE 2-4 GM/100ML-% IV SOLN
INTRAVENOUS | Status: AC
  Filled 2022-03-11: qty 100

## 2022-03-11 MED ORDER — ALBUTEROL SULFATE HFA 108 (90 BASE) MCG/ACT IN AERS
INHALATION_SPRAY | RESPIRATORY_TRACT | Status: DC | PRN
  Administered 2022-03-11 (×2): 4 via RESPIRATORY_TRACT

## 2022-03-11 MED ORDER — LACTATED RINGERS IV SOLN: INTRAVENOUS | Status: DC

## 2022-03-11 MED ORDER — ONDANSETRON HCL 4 MG/2ML IJ SOLN
INTRAMUSCULAR | Status: AC
  Filled 2022-03-11: qty 2

## 2022-03-11 MED ORDER — FENTANYL CITRATE (PF) 100 MCG/2ML IJ SOLN
INTRAMUSCULAR | Status: AC
  Administered 2022-03-11: 50 ug via INTRAVENOUS
  Filled 2022-03-11: qty 2

## 2022-03-11 MED ORDER — FENTANYL CITRATE (PF) 250 MCG/5ML IJ SOLN
INTRAMUSCULAR | Status: DC | PRN
  Administered 2022-03-11: 75 ug via INTRAVENOUS

## 2022-03-11 MED ORDER — PROPOFOL 10 MG/ML IV BOLUS
INTRAVENOUS | Status: DC | PRN
  Administered 2022-03-11: 100 mg via INTRAVENOUS

## 2022-03-11 MED ORDER — PHENYLEPHRINE 80 MCG/ML (10ML) SYRINGE FOR IV PUSH (FOR BLOOD PRESSURE SUPPORT)
PREFILLED_SYRINGE | INTRAVENOUS | Status: DC | PRN
  Administered 2022-03-11 (×2): 80 ug via INTRAVENOUS

## 2022-03-11 MED ORDER — FENTANYL CITRATE (PF) 250 MCG/5ML IJ SOLN
INTRAMUSCULAR | Status: AC
  Filled 2022-03-11: qty 5

## 2022-03-11 MED ORDER — LIDOCAINE 2% (20 MG/ML) 5 ML SYRINGE
INTRAMUSCULAR | Status: AC
  Filled 2022-03-11: qty 5

## 2022-03-11 MED ORDER — PHENYLEPHRINE 80 MCG/ML (10ML) SYRINGE FOR IV PUSH (FOR BLOOD PRESSURE SUPPORT)
PREFILLED_SYRINGE | INTRAVENOUS | Status: AC
  Filled 2022-03-11: qty 10

## 2022-03-11 MED ORDER — ONDANSETRON HCL 4 MG/2ML IJ SOLN
INTRAMUSCULAR | Status: DC | PRN
  Administered 2022-03-11: 4 mg via INTRAVENOUS

## 2022-03-11 MED ORDER — ORAL CARE MOUTH RINSE: 15.0000 mL | Freq: Once | OROMUCOSAL | Status: AC

## 2022-03-11 MED ORDER — ROCURONIUM BROMIDE 10 MG/ML (PF) SYRINGE
PREFILLED_SYRINGE | INTRAVENOUS | Status: DC | PRN
  Administered 2022-03-11: 20 mg via INTRAVENOUS
  Administered 2022-03-11: 70 mg via INTRAVENOUS

## 2022-03-11 MED ORDER — CEFAZOLIN SODIUM-DEXTROSE 2-4 GM/100ML-% IV SOLN
2.0000 g | Freq: Three times a day (TID) | INTRAVENOUS | Status: AC
  Administered 2022-03-11 – 2022-03-12 (×2): 2 g via INTRAVENOUS
  Filled 2022-03-11 (×2): qty 100

## 2022-03-11 MED ORDER — MIDAZOLAM HCL 2 MG/2ML IJ SOLN
INTRAMUSCULAR | Status: AC
  Filled 2022-03-11: qty 2

## 2022-03-11 MED ORDER — SODIUM ZIRCONIUM CYCLOSILICATE 10 G PO PACK: 10.0000 g | PACK | Freq: Once | ORAL | Status: DC

## 2022-03-11 MED ORDER — CEFAZOLIN SODIUM-DEXTROSE 2-3 GM-%(50ML) IV SOLR
INTRAVENOUS | Status: DC | PRN
  Administered 2022-03-11: 2 g via INTRAVENOUS

## 2022-03-11 MED ORDER — ALBUMIN HUMAN 5 % IV SOLN: INTRAVENOUS | Status: DC | PRN

## 2022-03-11 MED ORDER — AMISULPRIDE (ANTIEMETIC) 5 MG/2ML IV SOLN: 10.0000 mg | Freq: Once | INTRAVENOUS | Status: DC | PRN

## 2022-03-11 MED ORDER — PROPOFOL 10 MG/ML IV BOLUS
INTRAVENOUS | Status: AC
  Filled 2022-03-11: qty 20

## 2022-03-11 MED ORDER — DEXAMETHASONE SODIUM PHOSPHATE 10 MG/ML IJ SOLN
INTRAMUSCULAR | Status: DC | PRN
  Administered 2022-03-11: 10 mg via INTRAVENOUS

## 2022-03-11 MED ORDER — 0.9 % SODIUM CHLORIDE (POUR BTL) OPTIME
TOPICAL | Status: DC | PRN
  Administered 2022-03-11: 1000 mL

## 2022-03-11 MED ORDER — FENTANYL CITRATE (PF) 100 MCG/2ML IJ SOLN: 50.0000 ug | Freq: Once | INTRAMUSCULAR | Status: AC

## 2022-03-11 MED ORDER — EPHEDRINE SULFATE-NACL 50-0.9 MG/10ML-% IV SOSY
PREFILLED_SYRINGE | INTRAVENOUS | Status: DC | PRN
  Administered 2022-03-11: 5 mg via INTRAVENOUS

## 2022-03-11 MED ORDER — CLOPIDOGREL BISULFATE 75 MG PO TABS
75.0000 mg | ORAL_TABLET | Freq: Every day | ORAL | Status: DC
  Administered 2022-03-12 – 2022-03-16 (×5): 75 mg via ORAL
  Filled 2022-03-11 (×6): qty 1

## 2022-03-11 MED ORDER — ROCURONIUM BROMIDE 10 MG/ML (PF) SYRINGE
PREFILLED_SYRINGE | INTRAVENOUS | Status: AC
  Filled 2022-03-11: qty 10

## 2022-03-11 MED ORDER — EPHEDRINE 5 MG/ML INJ
INTRAVENOUS | Status: AC
  Filled 2022-03-11: qty 5

## 2022-03-11 MED ORDER — ACETAMINOPHEN 500 MG PO TABS
ORAL_TABLET | ORAL | Status: AC
  Administered 2022-03-11: 1000 mg via ORAL
  Filled 2022-03-11: qty 2

## 2022-03-11 MED ORDER — CHLORHEXIDINE GLUCONATE 0.12 % MT SOLN
OROMUCOSAL | Status: AC
Start: 2022-03-11 — End: 2022-03-11
  Administered 2022-03-11: 15 mL via OROMUCOSAL
  Filled 2022-03-11: qty 15

## 2022-03-11 MED ORDER — ENSURE ENLIVE PO LIQD
237.0000 mL | Freq: Two times a day (BID) | ORAL | Status: DC
  Administered 2022-03-12 – 2022-03-16 (×8): 237 mL via ORAL

## 2022-03-11 MED ORDER — CHLORHEXIDINE GLUCONATE 0.12 % MT SOLN: 15.0000 mL | Freq: Once | OROMUCOSAL | Status: AC

## 2022-03-11 MED ORDER — CHLORHEXIDINE GLUCONATE CLOTH 2 % EX PADS
6.0000 | MEDICATED_PAD | Freq: Every day | CUTANEOUS | Status: DC
  Administered 2022-03-11 – 2022-03-16 (×6): 6 via TOPICAL

## 2022-03-11 SURGICAL SUPPLY — 77 items
BAG COUNTER SPONGE SURGICOUNT (BAG) ×3 IMPLANT
BAG SPNG CNTER NS LX DISP (BAG) ×1
BIT DRILL 3.2 (BIT) ×2
BIT DRILL 3.2XCALB NS DISP (BIT) IMPLANT
BIT DRILL CALIBRATED 2.7 (BIT) ×1 IMPLANT
BIT DRL 3.2XCALB NS DISP (BIT) ×1
COVER SURGICAL LIGHT HANDLE (MISCELLANEOUS) ×3 IMPLANT
DRAPE INCISE IOBAN 66X45 STRL (DRAPES) ×6 IMPLANT
DRAPE U-SHAPE 47X51 STRL (DRAPES) ×3 IMPLANT
DRSG EMULSION OIL 3X3 NADH (GAUZE/BANDAGES/DRESSINGS) ×3 IMPLANT
DRSG MEPILEX BORDER 4X8 (GAUZE/BANDAGES/DRESSINGS) ×1 IMPLANT
DRSG PAD ABDOMINAL 8X10 ST (GAUZE/BANDAGES/DRESSINGS) ×3 IMPLANT
ELECT NDL TIP 2.8 STRL (NEEDLE) ×2 IMPLANT
ELECT NEEDLE TIP 2.8 STRL (NEEDLE) ×2 IMPLANT
ELECT REM PT RETURN 9FT ADLT (ELECTROSURGICAL) ×2
ELECTRODE REM PT RTRN 9FT ADLT (ELECTROSURGICAL) ×2 IMPLANT
GAUZE SPONGE 4X4 12PLY STRL (GAUZE/BANDAGES/DRESSINGS) ×3 IMPLANT
GLOVE BIOGEL PI ORTHO PRO 7.5 (GLOVE) ×1
GLOVE BIOGEL PI ORTHO PRO SZ8 (GLOVE) ×1
GLOVE ORTHO TXT STRL SZ7.5 (GLOVE) ×3 IMPLANT
GLOVE PI ORTHO PRO STRL 7.5 (GLOVE) ×2 IMPLANT
GLOVE PI ORTHO PRO STRL SZ8 (GLOVE) ×2 IMPLANT
GLOVE SURG ORTHO 8.5 STRL (GLOVE) ×3 IMPLANT
GOWN STRL REUS W/ TWL LRG LVL3 (GOWN DISPOSABLE) ×4 IMPLANT
GOWN STRL REUS W/ TWL XL LVL3 (GOWN DISPOSABLE) ×4 IMPLANT
GOWN STRL REUS W/TWL LRG LVL3 (GOWN DISPOSABLE) ×4
GOWN STRL REUS W/TWL XL LVL3 (GOWN DISPOSABLE) ×4
HEMOSTAT ARISTA ABSORB 3G PWDR (HEMOSTASIS) ×1 IMPLANT
K-WIRE 2X5 SS THRDED S3 (WIRE) ×8
KIT BASIN OR (CUSTOM PROCEDURE TRAY) ×3 IMPLANT
KIT TURNOVER KIT B (KITS) ×3 IMPLANT
KWIRE 2X5 SS THRDED S3 (WIRE) IMPLANT
MANIFOLD NEPTUNE II (INSTRUMENTS) ×3 IMPLANT
NDL 1/2 CIR MAYO (NEEDLE) ×2 IMPLANT
NEEDLE 1/2 CIR MAYO (NEEDLE) ×2 IMPLANT
NEEDLE 22X1 1/2 (OR ONLY) (NEEDLE) IMPLANT
NS IRRIG 1000ML POUR BTL (IV SOLUTION) ×3 IMPLANT
PACK SHOULDER (CUSTOM PROCEDURE TRAY) ×3 IMPLANT
PAD ARMBOARD 7.5X6 YLW CONV (MISCELLANEOUS) ×6 IMPLANT
PASSER SUT SWANSON 36MM LOOP (INSTRUMENTS) ×3 IMPLANT
PEG LOCKING 3.2MMX44 (Peg) ×2 IMPLANT
PEG LOCKING 3.2MMX54 (Peg) ×2 IMPLANT
PEG LOCKING 3.2X34 (Screw) ×1 IMPLANT
PEG LOCKING 3.2X36 (Screw) ×1 IMPLANT
PEG LOCKING 3.2X40 (Peg) ×1 IMPLANT
PEG LOCKING 3.2X48 (Peg) ×2 IMPLANT
PLATE PROX HUMERUS HI LT 4H (Plate) ×1 IMPLANT
SCREW CORTICAL LOW PROF 3.5X34 (Screw) ×1 IMPLANT
SCREW LOCK CORT STAR 3.5X28 (Screw) ×1 IMPLANT
SCREW LOCK CORT STAR 3.5X36 (Screw) ×1 IMPLANT
SCREW LOCK CORT STAR 3.5X40 (Screw) ×1 IMPLANT
SCREW LOW PROF TIS 3.5X28MM (Screw) ×1 IMPLANT
SCREW LP NL T15 3.5X26 (Screw) ×1 IMPLANT
SCREW T15 LP CORT 3.5X36MM NS (Screw) ×1 IMPLANT
SCREW T15 LP CORT 3.5X40MM NS (Screw) ×1 IMPLANT
SLING ARM FOAM STRAP XLG (SOFTGOODS) IMPLANT
SLING ARM IMMOBILIZER LRG (SOFTGOODS) IMPLANT
SPONGE T-LAP 4X18 ~~LOC~~+RFID (SPONGE) ×6 IMPLANT
STAPLER VISISTAT 35W (STAPLE) ×3 IMPLANT
STRIP CLOSURE SKIN 1/2X4 (GAUZE/BANDAGES/DRESSINGS) ×3 IMPLANT
SUCTION FRAZIER HANDLE 10FR (MISCELLANEOUS) ×2
SUCTION TUBE FRAZIER 10FR DISP (MISCELLANEOUS) ×2 IMPLANT
SUT BONE WAX W31G (SUTURE) IMPLANT
SUT ETHIBOND NAB CT1 #1 30IN (SUTURE) ×6 IMPLANT
SUT FIBERWIRE #2 38 T-5 BLUE (SUTURE)
SUT MNCRL AB 4-0 PS2 18 (SUTURE) ×3 IMPLANT
SUT VIC AB 0 CT1 27 (SUTURE) ×2
SUT VIC AB 0 CT1 27XBRD ANBCTR (SUTURE) ×2 IMPLANT
SUT VIC AB 2-0 CT1 27 (SUTURE) ×4
SUT VIC AB 2-0 CT1 TAPERPNT 27 (SUTURE) ×4 IMPLANT
SUT VICRYL 4-0 PS2 18IN ABS (SUTURE) ×3 IMPLANT
SUTURE FIBERWR #2 38 T-5 BLUE (SUTURE) IMPLANT
SYR CONTROL 10ML LL (SYRINGE) ×3 IMPLANT
TOWEL GREEN STERILE (TOWEL DISPOSABLE) ×3 IMPLANT
TOWEL GREEN STERILE FF (TOWEL DISPOSABLE) ×3 IMPLANT
WATER STERILE IRR 1000ML POUR (IV SOLUTION) ×3 IMPLANT
YANKAUER SUCT BULB TIP NO VENT (SUCTIONS) ×3 IMPLANT

## 2022-03-11 NOTE — Progress Notes (Signed)
The risks and benefits of surgical repair of the left humerus were discussed with the patient's wife, including the possibility of infection, nerve injury, vessel injury, wound breakdown, arthritis, symptomatic hardware, DVT/ PE, loss of motion, malunion, nonunion, and need for further surgery among others.  She acknowledged these risks and provided consent to proceed. ? ?Altamese Shickley, MD ?[facility], [place] ?[phone] ? ?

## 2022-03-11 NOTE — Anesthesia Procedure Notes (Signed)
Procedure Name: Intubation ?Date/Time: 03/11/2022 12:43 PM ?Performed by: Lorie Phenix, CRNA ?Pre-anesthesia Checklist: Patient identified, Emergency Drugs available, Suction available and Patient being monitored ?Patient Re-evaluated:Patient Re-evaluated prior to induction ?Oxygen Delivery Method: Circle system utilized ?Preoxygenation: Pre-oxygenation with 100% oxygen ?Induction Type: IV induction ?Ventilation: Mask ventilation without difficulty ?Laryngoscope Size: Mac and 4 ?Grade View: Grade I ?Tube type: Oral ?Tube size: 7.5 mm ?Number of attempts: 1 ?Airway Equipment and Method: Stylet ?Placement Confirmation: ETT inserted through vocal cords under direct vision, positive ETCO2 and breath sounds checked- equal and bilateral ?Secured at: 23 cm ?Tube secured with: Tape ?Dental Injury: Teeth and Oropharynx as per pre-operative assessment  ?Comments: Intubated by Lovie Chol, paramedic student, under direct supervision of MDA and CRNA. ? ? ? ? ?

## 2022-03-11 NOTE — Progress Notes (Addendum)
? ? ? ?  Subjective: ? ?Patient reports pain as mild.  No issues overnight.  Denies distal numbness tingling in the hand or foot.  Eager to get his left arm fixed so that he can proceed with therapy and recovery. ? ?Objective:  ? ?VITALS:   ?Vitals:  ? 03/10/22 2025 03/10/22 2106 03/11/22 0413 03/11/22 9833  ?BP: (!) 154/88 (!) 143/80 124/78 125/73  ?Pulse: (!) 111 99 (!) 101 (!) 105  ?Resp: '16 18 16   '$ ?Temp: 97.6 ?F (36.4 ?C) 98.1 ?F (36.7 ?C) 98 ?F (36.7 ?C) 99.2 ?F (37.3 ?C)  ?TempSrc:  Oral  Oral  ?SpO2: 93% 96% 95% 94%  ?Weight:      ?Height:      ? ? ?Sensation intact distally ?Intact pulses distally ?Dorsiflexion/Plantar flexion intact ?Incision: dressing C/D/I ?Compartment soft ?  ? ?Lab Results  ?Component Value Date  ? WBC 13.7 (H) 03/11/2022  ? HGB 9.9 (L) 03/11/2022  ? HCT 30.6 (L) 03/11/2022  ? MCV 103.4 (H) 03/11/2022  ? PLT 142 (L) 03/11/2022  ? ?BMET ?   ?Component Value Date/Time  ? NA 139 03/11/2022 0230  ? NA 141 08/15/2020 1006  ? K 5.3 (H) 03/11/2022 0230  ? CL 107 03/11/2022 0230  ? CO2 24 03/11/2022 0230  ? GLUCOSE 163 (H) 03/11/2022 0230  ? BUN 25 (H) 03/11/2022 0230  ? BUN 13 08/15/2020 1006  ? CREATININE 1.40 (H) 03/11/2022 0230  ? CREATININE 1.15 09/02/2016 0819  ? CALCIUM 8.8 (L) 03/11/2022 0230  ? GFRNONAA 52 (L) 03/11/2022 0230  ? ? ? ? ?Xray: Postop x-rays demonstrate cemented total hip arthroplasty without adverse features. ? ?Assessment/Plan: ?1 Day Post-Op  ? ?Principal Problem: ?  Hip fracture ([place]) ?Active Problems: ?  Type 2 diabetes mellitus ([place]) ?  Sick sinus syndrome ([place]) ?  Chronic combined systolic and diastolic heart failure ([place]) ?  Dementia ([place]) ?  DNR (do not resuscitate) ?  S/P hip replacement, left ? ? ?Left hip cemented hemiarthroplasty for femoral neck fracture 03/10/22 ?Left proximal humerus fracture possible ORIF with [name] today. ? ?  ?Post op recs: ?WB: WBAT with posterior hip precautions x6 weeks ?Abx: ancef x23 hours post op ?Imaging: PACU xrays ?Dressing:  Aquacel dressing to be kept intact until follow-up ?DVT prophylaxis: lovenox starting POD1 x4 weeks - held today given possible OR with [name] for Left humerus ?Follow up: 2 weeks after surgery for a wound check with [name] at [facility].  ?Address: [address] [place], [place], [place] [address]  ?Office Phone: [phone] ?  ? ?Mccall Will A Hays Dunnigan ?03/11/2022, 8:25 AM ? ? ?Charlies Constable, MD ? ?Contact information:   ?Weekdays 7am-5pm epic message [name], or call office for patient follow up: [phone] ?After hours and holidays please check [email or web address] for group call information for [facility] ? ?  ?

## 2022-03-11 NOTE — Anesthesia Postprocedure Evaluation (Signed)
Anesthesia Post Note ? ?Patient: Kevin [name] ? ?Procedure(s) Performed: OPEN REDUCTION INTERNAL FIXATION (ORIF) SHOULDER FRACTURE (Left: Shoulder) ? ?  ? ?Patient location during evaluation: PACU ?Anesthesia Type: General ?Level of consciousness: sedated ?Pain management: pain level controlled ?Vital Signs Assessment: post-procedure vital signs reviewed and stable ?Respiratory status: spontaneous breathing and respiratory function stable ?Cardiovascular status: stable ?Postop Assessment: no apparent nausea or vomiting ?Anesthetic complications: no ? ? ?No notable events documented. ? ?Last Vitals:  ?Vitals:  ? 03/11/22 1814 03/11/22 2009  ?BP: (!) 125/57 (!) 98/56  ?Pulse:  100  ?Resp: 18 18  ?Temp: 36.4 ?C 36.6 ?C  ?SpO2: 94% 94%  ?  ?Last Pain:  ?Vitals:  ? 03/11/22 2009  ?TempSrc: Oral  ?PainSc:   ? ? ?  ?  ?  ?  ?  ?  ? ?Lawrie Tunks DANIEL ? ? ? ? ?

## 2022-03-11 NOTE — Progress Notes (Signed)
PERIOPERATIVE PRESCRIPTION FOR IMPLANTED CARDIAC DEVICE PROGRAMMING ? ?Patient Information: ?Name:  Kevin [name]  ?DOB:  1946/10/10  ?MRN:  [id number]  ?  ?Planned Procedure:  Open Reductions Internal Fixation (ORIF) shoulder fracture - Left  ?Surgeon:  Dr. Altamese Angelica  ?Date of Procedure:  03/11/2022  ?Cautery will be used.  ?Position during surgery:  Supine  ? ?Please send documentation back to:  ?[facility] (Fax # [phone])  ?Device Information: ? ?Clinic EP Physician:  [name], MD  ? ?Device Type:  Defibrillator ?Manufacturer and Phone #:  Medtronic: [id number] ?Pacemaker Dependent?:  Yes.   ?Date of Last Device Check:  03/11/22 Normal Device Function?:  Yes.   ? ?Electrophysiologist's Recommendations: ? ?Have magnet available. ?Provide continuous ECG monitoring when magnet is used or reprogramming is to be performed.  ?Procedure will likely interfere with device function.  Device should be programmed:  Tachy therapies disabled and Asynchronous pacing during procedure and returned to normal programming after procedure ? ?Per Device Clinic Standing Orders, ?Wanda Plump, RN  ?9:04 AM 03/11/2022  ?

## 2022-03-11 NOTE — Progress Notes (Signed)
[facility]) Hospital Liaison note: ? ?This patient is currently enrolled in [facility] outpatient-based Palliative Care. Will continue to follow for disposition. ? ?Please call with any outpatient palliative questions or concerns. ? ?Thank you, ?Lorelee Market, LPN ?[facility] Hospital Liaison ?[phone] ?

## 2022-03-11 NOTE — Transfer of Care (Signed)
Immediate Anesthesia Transfer of Care Note ? ?Patient: Kevin [name] ? ?Procedure(s) Performed: OPEN REDUCTION INTERNAL FIXATION (ORIF) SHOULDER FRACTURE (Left: Shoulder) ? ?Patient Location: PACU ? ?Anesthesia Type:General and Regional ? ?Level of Consciousness: drowsy and patient cooperative ? ?Airway & Oxygen Therapy: Patient Spontanous Breathing and Patient connected to nasal cannula oxygen ? ?Post-op Assessment: Report given to RN and Post -op Vital signs reviewed and stable ? ?Post vital signs: Reviewed and stable ? ?Last Vitals:  ?Vitals Value Taken Time  ?BP 118/60 03/11/22 1455  ?Temp 36.5 ?C 03/11/22 1455  ?Pulse 105 03/11/22 1458  ?Resp 14 03/11/22 1458  ?SpO2 99 % 03/11/22 1458  ?Vitals shown include unvalidated device data. ? ?Last Pain:  ?Vitals:  ? 03/11/22 1043  ?TempSrc: Oral  ?PainSc:   ?   ? ?Patients Stated Pain Goal: 2 (03/10/22 1940) ? ?Complications: No notable events documented. ?

## 2022-03-11 NOTE — Anesthesia Preprocedure Evaluation (Addendum)
Anesthesia Evaluation  ?Patient identified by MRN, date of birth, ID band ?Patient confused ? ?General Assessment Comment:Patient awake and responsive ? ?Reviewed: ?Allergy & Precautions, NPO status , Patient's Chart, lab work & pertinent test results ? ?History of Anesthesia Complications ?Negative for: history of anesthetic complications ? ?Airway ?Mallampati: I ? ?TM Distance: >3 FB ?Neck ROM: Full ? ? ? Dental ? ?(+) Missing, Edentulous Upper, Partial Lower ?  ?Pulmonary ?former smoker,  ?  ?Pulmonary exam normal ? ? ? ? ? ? ? Cardiovascular ?hypertension, +CHF  ?+ dysrhythmias + pacemaker  ?Rhythm:Regular Rate:Tachycardia ? ?Nonischemic cardiomyopathy  ? ?ECHO: ?Left ventricular ejection fraction, by estimation, is 40 to 45%. ?  ?Neuro/Psych ?PSYCHIATRIC DISORDERS Anxiety Depression Dementia   ? GI/Hepatic ?negative GI ROS, Neg liver ROS,   ?Endo/Other  ?diabetes, Type 2 ? Renal/GU ?Renal InsufficiencyRenal disease  ? ?  ?Musculoskeletal ?negative musculoskeletal ROS ?(+)  ? Abdominal ?  ?Peds ? Hematology ? ?(+) Blood dyscrasia, anemia ,   ?Anesthesia Other Findings ?LEFT HIP FRACTURE ? Reproductive/Obstetrics ? ?  ? ? ? ? ? ? ? ? ? ? ? ? ? ?  ?  ? ? ? ? ? ? ? ?Anesthesia Physical ? ?Anesthesia Plan ? ?ASA: 3 ? ?Anesthesia Plan: General  ? ?Post-op Pain Management: Regional block* and Tylenol PO (pre-op)*  ? ?Induction: Intravenous ? ?PONV Risk Score and Plan: 2 and Treatment may vary due to age or medical condition, Ondansetron and Dexamethasone ? ?Airway Management Planned: Oral ETT ? ?Additional Equipment:  ? ?Intra-op Plan:  ? ?Post-operative Plan: Extubation in OR ? ?Informed Consent: I have reviewed the patients History and Physical, chart, labs and discussed the procedure including the risks, benefits and alternatives for the proposed anesthesia with the patient or authorized representative who has indicated his/her understanding and acceptance.  ? ?Patient has DNR.   ?Discussed DNR with power of attorney and Suspend DNR. ?  ?Dental advisory given and Consent reviewed with POA ? ?Plan Discussed with: CRNA, Anesthesiologist and Surgeon ? ?Anesthesia Plan Comments:   ? ? ? ? ? ?Anesthesia Quick Evaluation ? ?

## 2022-03-11 NOTE — Progress Notes (Addendum)
Initial Nutrition Assessment ? ?DOCUMENTATION CODES:  ? ?Underweight ? ?INTERVENTION:  ?Once diet advances, provide Ensure Enlive po BID, each supplement provides 350 kcal and 20 grams of protein. ? ?NUTRITION DIAGNOSIS:  ? ?Increased nutrient needs related to post-op healing as evidenced by estimated needs. ? ?GOAL:  ? ?Patient will meet greater than or equal to 90% of their needs ? ?MONITOR:  ? ?Supplement acceptance, Diet advancement, Labs, Weight trends, Skin, I & O's ? ?REASON FOR ASSESSMENT:  ? ?Consult ?Assessment of nutrition requirement/status, Hip fracture protocol ? ?ASSESSMENT:  ? ?76 year old male with PMH DM, CHF, dementia presents after syncopal event and fall. X-rays showed humeral and femoral neck fractures. ? ?5/3 - s/p left hip cemented hemiarthroplasty for femoral neck fracture  ? ?Pt currently unavailable, in OR for left proximal humerus fracture ORIF. RD to order nutritional supplements to aid in caloric and protein needs as well as in post op healing. RN to provide nutritional supplementation once diet advances as appropriate. Unable to complete Nutrition-Focused physical exam at this time.  ? ?Labs and medications reviewed.  ? ?Diet Order:   ?Diet Order   ? ?       ?  Diet NPO time specified  Diet effective midnight       ?  ? ?  ?  ? ?  ? ? ?EDUCATION NEEDS:  ? ?Not appropriate for education at this time ? ?Skin:  Skin Assessment: Reviewed RN Assessment ? ?Last BM:  Unknown ? ?Height:  ? ?Ht Readings from Last 1 Encounters:  ?03/11/22 '6\' 4"'$  (1.93 m)  ? ? ?Weight:  ? ?Wt Readings from Last 1 Encounters:  ?03/11/22 68 kg  ? ?BMI:  Body mass index is 18.25 kg/m?. ? ?Estimated Nutritional Needs:  ? ?Kcal:  2000-2200 ? ?Protein:  100-110 grams ? ?Fluid:  >/= 2 L/day ? ?Corrin Parker, MS, RD, LDN ?RD pager number/after hours weekend pager number on Amion. ? ?

## 2022-03-11 NOTE — Progress Notes (Signed)
?PROGRESS NOTE ? ? ? ?Kevin [name]  MHD:[id number] DOB: Dec 21, 1945 DOA: 03/09/2022 ?PCP: [name], MD  ? 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  Patient states that he tripped on his trash can and the wife states that he usually uses a walker but did not do so at the time when he fell.  He did not hit his head and did not lose consciousness but after his fall he had pain in the left shoulder and left hip is brought to the ED.  X-rays in the ED showed a left hip fracture and a left proximal humerus fracture.  Orthopedic surgery was consulted and the plan is for surgical intervention of his left hip today with Dr. Melida Gimenez in his shoulder tomorrow with Dr. Ginette Pitman.  Currently he is n.p.o. for surgical intervention later this afternoon.  ? ? ?Assessment and Plan: ? ?Left hip fracture and left proximal humerus fracture after mechanical fall  ?-Orthopedic surgery was consulted and appreciate their evaluation and management; plan is for surgical intervention of his left hip yesterday for hemiarthroplasty with him going back to the OR today for his left shoulder. ?-Continue with morphine 0.5 mg IV every 2 as needed for severe pain and Ondansetron 4 mg IV once ?-Patient had a nerve block ?-Further care per orthopedic surgery ?-Surgery recommending Lovenox starting postoperative day 1 for 4 weeks but this is held given that he will likely go to the OR today with Dr. Ginette Pitman for his humeral fracture ?-Orthopedic surgery recommending weightbearing as tolerated with posterior hip precautions for 6 weeks for his leg ?  ?Chronic Combined Systolic and  Diastolic CHF status post ICD placement  ?-Appears compensated.  Patient is not on any diuretics.   ?-Continue to monitor for S/Sx of Volume Overload ?-Patient is +947.7 mL currently  ?  ?Diabetes Mellitus Type 2 ?-Initiated on Sensitive NovoLog Sliding Scale every 4h ?-CBGs ranging from 100-140 ?-Continue to Monitor and Trend Blood Sugars carefully  ?  ?Chronic Kidney Disease Stage IIIa  ?-Creatinine usually is around 1.1-1.3. ?-Patient's BUNs/creatinine went from 20/1.40 -> 17/1.34 -> 25/1.40 ?-Avoid further nephrotoxic medications, contrast dyes, hypotension and dehydration to ensure adequate renal perfusion and will need to renally adjust medications ?-Repeat CMP in the a.m. ?  ?Hypertension  ?-Presently not on any medication.  Follow closely.   ?-Will place on As needed IV hydralazine for systolic blood pressure more than 160. ?-Continue to Monitor BP per Protocol ?-Last BP was 139/80 ?  ?Leukocytosis, slightly worsening ?-Patient's WBC went from 10.2 -> 11.9 -> 13.7 ?-Likely reactive in the setting of his fall and now Surgical Intervention ?-U/A done and showed clear appearance with yellow color urine, Small Leukocytes, Negative Nitrites, many bacteria, present mucus, 0-5 RBCs per high-power field, 21-50 WBCs ?-Continue to monitor for signs and symptoms of infection; no overt infection noted ?-Repeat CBC in a.m. ?  ?History of Prior Stroke ?-He is on Plavix which is on hold for now in anticipation of surgery. Resume when ok from Ortho ? ?Hyperkalemia ?-Patient's K+ went from 4.4 -> 5.3 ?-Give 10 grams of Lokelma x1 ?-Continue to Monitor and Trend ?-Repeat CMP in the AM  ?  ?  Macrocytic Anemia with Acute Blood Loss Anemia from Post-Operative Cause ?-Patient's Hgb/Hct went from 12.2/36.0 -> 11.0/33.9 -> 9.9/30.6 with an MCV of 103.4 ?-Check Anemia Panel in the AM  ?-Continue to Monitor for S/Sx of Bleeding; No overt bleeding noted ?-Repeat CBC in the AM  ? ?Thrombocytopenia ?-Patient's Platelet Count went from  179 -> 156 -> 142 ?-Continue to Monitor for S/Sx of Bleeding; No overt Bleeding noter ?-Repeat CBC in the AM  ? ?Hypoalbuminemia ?-Patient's Albumin Level was 3.2 ?-Continue to Monitor and Trend ?-Repeat CMP in the AM  ?  ?Dementia  ?-No acute issues at this time.  Resume home medications after surgery when patient can take p.o. ?-He was resumed on his home Mirtazapine 15 mg p.o. nightly ? ?DVT prophylaxis: SCDs Start: 03/09/22 2302 ? ?  Code Status: DNR ?Family Communication: No family present at bedside  ? ?Disposition Plan:  ?Level of care: Telemetry Medical ?Status is: Inpatient ?Remains inpatient appropriate because: Undergoing Surgical Intervention for his Left Shoulder today.  ?  ?Consultants:  ?Orthopedic Surgery ? ?Procedures:  ?ARTHROPLASTY BIPOLAR HIP (HEMIARTHROPLASTY) by Dr. Almira Coaster  ? ?Antimicrobials:  ?Anti-infectives (From admission, onward)  ? ? Start     Dose/Rate Route Frequency Ordered Stop  ? 03/11/22 1053  ceFAZolin (ANCEF) 2-4 GM/100ML-% IVPB       ?Note to Pharmacy: Cameron Sprang M: cabinet override  ?    03/11/22 1053 03/11/22 2259  ? 03/11/22 0600  ceFAZolin (ANCEF) IVPB 2g/100 mL premix       ? 2 g ?200 mL/hr over 30 Minutes Intravenous On call to O.R. 03/10/22 1609 03/10/22 1745  ? ?  ?  ?Subjective: ?Seen and examined at bedside and he is doing okay and wants to work with therapy.  States he slept okay and denies any nausea or vomiting.  Denies any chest pain or shortness of breath.  States that his pain is only mild.  No other concerns or complaints at this time ? ?Objective: ?Vitals:  ? 03/11/22 1130 03/11/22 1135 03/11/22 1140 03/11/22 1145  ?BP: 130/70 (!) 131/117 139/80   ?Pulse: 97 (!) 102 (!) 106 99  ?Resp: '16 16 19 18  '$ ?Temp:      ?TempSrc:      ?SpO2: 97% 97% 97% 96%  ?Weight:      ?Height:      ? ? ?Intake/Output Summary (Last 24 hours) at 03/11/2022 1321 ?Last data filed at 03/11/2022 1315 ?Gross per 24 hour  ?Intake 1000 ml  ?Output 1100 ml  ?Net -100 ml  ? ?Filed Weights   ? 03/09/22 2125 03/11/22 1043  ?Weight: 68 kg 68 kg  ? ?Examination: ?Physical Exam: ? ?Constitutional: Thin Caucasian male in NAD appears calm ?Respiratory: Diminished to auscultation bilaterally, no wheezing, rales, rhonchi or crackles. Normal respiratory effort and patient is not tachypenic. No accessory muscle use. Unlabored breathing   ?Cardiovascular: RRR, no murmurs / rubs / gallops. S1 and S2 auscultated. No extremity edema. ?Abdomen: Soft, non-tender, non-distended. Bowel sounds positive.  ?GU: Deferred. ?Musculoskeletal: No clubbing / cyanosis of digits/nails. No joint deformity upper and lower extremities.  ?Skin: No rashes, lesions, ulcers on a limited skin evaluation. No induration; Warm and dry.  ?Neurologic: CN 2-12 grossly intact with no focal deficits. Romberg sign and cerebellar reflexes not assessed.  ?Psychiatric: Normal judgment and insight. Alert and oriented x 3. Normal mood and appropriate affect.  ? ?Data Reviewed: I have personally reviewed following labs and imaging studies ? ?CBC: ?Recent Labs  ?Lab  03/09/22 ?2139 03/09/22 ?2225 03/10/22 ?0416 03/11/22 ?0230  ?WBC 10.2  --  11.9* 13.7*  ?NEUTROABS 8.2*  --   --  11.8*  ?HGB 12.1* 12.2* 11.0* 9.9*  ?HCT 38.7* 36.0* 33.9* 30.6*  ?MCV 107.2*  --  106.3* 103.4*  ?PLT 179  --  156 142*  ? ?Basic Metabolic Panel: ?Recent Labs  ?Lab 03/09/22 ?2139 03/09/22 ?2225 03/10/22 ?0416 03/11/22 ?0230  ?NA 141 142 141 139  ?K 4.3 4.2 4.4 5.3*  ?CL 109 105 109 107  ?CO2 25  --  25 24  ?GLUCOSE 180* 174* 164* 163*  ?BUN '16 20 17 '$ 25*  ?CREATININE 1.45* 1.40* 1.34* 1.40*  ?CALCIUM 9.1  --  8.7* 8.8*  ?MG  --   --   --  2.0  ?PHOS  --   --   --  4.0  ? ?GFR: ?Estimated Creatinine Clearance: 43.2 mL/min (A) (by C-G formula based on SCr of 1.4 mg/dL (H)). ?Liver Function Tests: ?Recent Labs  ?Lab 03/11/22 ?0230  ?AST 22  ?ALT 9  ?ALKPHOS 68  ?BILITOT 0.7  ?PROT 5.9*  ?ALBUMIN 3.2*  ? ?No results for input(s): LIPASE, AMYLASE in the last 168 hours. ?No results  for input(s): AMMONIA in the last 168 hours. ?Coagulation Profile: ?Recent Labs  ?Lab 03/09/22 ?2139  ?INR 1.0  ? ?Cardiac Enzymes: ?No results for input(s): CKTOTAL, CKMB, CKMBINDEX, TROPONINI in the last

## 2022-03-11 NOTE — Progress Notes (Signed)
Tele noted 3 p waves in a row appears as 2 degree HB type 2. Pt is asymptomatic. MD made aware.  ?

## 2022-03-12 ENCOUNTER — Encounter (HOSPITAL_COMMUNITY): Payer: Self-pay | Admitting: Orthopedic Surgery

## 2022-03-12 DIAGNOSIS — S42212A Unspecified displaced fracture of surgical neck of left humerus, initial encounter for closed fracture: Secondary | ICD-10-CM | POA: Diagnosis not present

## 2022-03-12 DIAGNOSIS — I5042 Chronic combined systolic (congestive) and diastolic (congestive) heart failure: Secondary | ICD-10-CM | POA: Diagnosis not present

## 2022-03-12 DIAGNOSIS — S72001A Fracture of unspecified part of neck of right femur, initial encounter for closed fracture: Secondary | ICD-10-CM

## 2022-03-12 DIAGNOSIS — E43 Unspecified severe protein-calorie malnutrition: Secondary | ICD-10-CM | POA: Insufficient documentation

## 2022-03-12 DIAGNOSIS — S72042A Displaced fracture of base of neck of left femur, initial encounter for closed fracture: Secondary | ICD-10-CM | POA: Diagnosis not present

## 2022-03-12 LAB — COMPREHENSIVE METABOLIC PANEL
ALT: 8 U/L (ref 0–44)
AST: 18 U/L (ref 15–41)
Albumin: 3.3 g/dL — ABNORMAL LOW (ref 3.5–5.0)
Alkaline Phosphatase: 53 U/L (ref 38–126)
Anion gap: 8 (ref 5–15)
BUN: 35 mg/dL — ABNORMAL HIGH (ref 8–23)
CO2: 26 mmol/L (ref 22–32)
Calcium: 8.4 mg/dL — ABNORMAL LOW (ref 8.9–10.3)
Chloride: 108 mmol/L (ref 98–111)
Creatinine, Ser: 1.54 mg/dL — ABNORMAL HIGH (ref 0.61–1.24)
GFR, Estimated: 46 mL/min — ABNORMAL LOW (ref >60)
Glucose, Bld: 140 mg/dL — ABNORMAL HIGH (ref 70–99)
Potassium: 4.3 mmol/L (ref 3.5–5.1)
Sodium: 142 mmol/L (ref 135–145)
Total Bilirubin: 0.5 mg/dL (ref 0.3–1.2)
Total Protein: 5.7 g/dL — ABNORMAL LOW (ref 6.5–8.1)

## 2022-03-12 LAB — CBC WITH DIFFERENTIAL/PLATELET
Abs Immature Granulocytes: 0.02 10*3/uL (ref 0.00–0.07)
Basophils Absolute: 0 10*3/uL (ref 0.0–0.1)
Basophils Relative: 0 %
Eosinophils Absolute: 0 10*3/uL (ref 0.0–0.5)
Eosinophils Relative: 0 %
HCT: 24.8 % — ABNORMAL LOW (ref 39.0–52.0)
Hemoglobin: 7.8 g/dL — ABNORMAL LOW (ref 13.0–17.0)
Immature Granulocytes: 0 %
Lymphocytes Relative: 9 %
Lymphs Abs: 0.8 10*3/uL (ref 0.7–4.0)
MCH: 33.1 pg (ref 26.0–34.0)
MCHC: 31.5 g/dL (ref 30.0–36.0)
MCV: 105.1 fL — ABNORMAL HIGH (ref 80.0–100.0)
Monocytes Absolute: 0.9 10*3/uL (ref 0.1–1.0)
Monocytes Relative: 10 %
Neutro Abs: 7.2 10*3/uL (ref 1.7–7.7)
Neutrophils Relative %: 81 %
Platelets: 110 10*3/uL — ABNORMAL LOW (ref 150–400)
RBC: 2.36 MIL/uL — ABNORMAL LOW (ref 4.22–5.81)
RDW: 13.5 % (ref 11.5–15.5)
WBC: 9 10*3/uL (ref 4.0–10.5)
nRBC: 0 % (ref 0.0–0.2)

## 2022-03-12 LAB — PREPARE RBC (CROSSMATCH)

## 2022-03-12 LAB — GLUCOSE, CAPILLARY
Glucose-Capillary: 117 mg/dL — ABNORMAL HIGH (ref 70–99)
Glucose-Capillary: 126 mg/dL — ABNORMAL HIGH (ref 70–99)
Glucose-Capillary: 134 mg/dL — ABNORMAL HIGH (ref 70–99)
Glucose-Capillary: 156 mg/dL — ABNORMAL HIGH (ref 70–99)
Glucose-Capillary: 229 mg/dL — ABNORMAL HIGH (ref 70–99)
Glucose-Capillary: 311 mg/dL — ABNORMAL HIGH (ref 70–99)

## 2022-03-12 LAB — IRON AND TIBC
Iron: 20 ug/dL — ABNORMAL LOW (ref 45–182)
Saturation Ratios: 9 % — ABNORMAL LOW (ref 17.9–39.5)
TIBC: 227 ug/dL — ABNORMAL LOW (ref 250–450)
UIBC: 207 ug/dL

## 2022-03-12 LAB — RETICULOCYTES
Immature Retic Fract: 11.7 % (ref 2.3–15.9)
RBC.: 2.37 MIL/uL — ABNORMAL LOW (ref 4.22–5.81)
Retic Count, Absolute: 43.1 10*3/uL (ref 19.0–186.0)
Retic Ct Pct: 1.8 % (ref 0.4–3.1)

## 2022-03-12 LAB — VITAMIN B12: Vitamin B-12: 277 pg/mL (ref 180–914)

## 2022-03-12 LAB — FERRITIN: Ferritin: 280 ng/mL (ref 24–336)

## 2022-03-12 LAB — PHOSPHORUS: Phosphorus: 3.2 mg/dL (ref 2.5–4.6)

## 2022-03-12 LAB — MAGNESIUM: Magnesium: 2.2 mg/dL (ref 1.7–2.4)

## 2022-03-12 LAB — FOLATE: Folate: 7.6 ng/mL (ref >5.9)

## 2022-03-12 LAB — VITAMIN D 25 HYDROXY (VIT D DEFICIENCY, FRACTURES): Vit D, 25-Hydroxy: 41.68 ng/mL (ref 30–100)

## 2022-03-12 MED ORDER — ACETAMINOPHEN 325 MG PO TABS: 325.0000 mg | ORAL_TABLET | Freq: Four times a day (QID) | ORAL | Status: DC | PRN

## 2022-03-12 MED ORDER — DOCUSATE SODIUM 100 MG PO CAPS
100.0000 mg | ORAL_CAPSULE | Freq: Two times a day (BID) | ORAL | Status: DC
  Administered 2022-03-12 – 2022-03-16 (×9): 100 mg via ORAL
  Filled 2022-03-12 (×9): qty 1

## 2022-03-12 MED ORDER — METOCLOPRAMIDE HCL 5 MG PO TABS: 5.0000 mg | ORAL_TABLET | Freq: Three times a day (TID) | ORAL | Status: DC | PRN

## 2022-03-12 MED ORDER — CEFAZOLIN SODIUM-DEXTROSE 1-4 GM/50ML-% IV SOLN: 1.0000 g | Freq: Four times a day (QID) | INTRAVENOUS | Status: DC

## 2022-03-12 MED ORDER — ACETAMINOPHEN 325 MG PO TABS
650.0000 mg | ORAL_TABLET | Freq: Once | ORAL | Status: AC
  Administered 2022-03-12: 650 mg via ORAL
  Filled 2022-03-12: qty 2

## 2022-03-12 MED ORDER — FUROSEMIDE 10 MG/ML IJ SOLN
20.0000 mg | Freq: Once | INTRAMUSCULAR | Status: AC
  Administered 2022-03-12: 20 mg via INTRAVENOUS
  Filled 2022-03-12: qty 2

## 2022-03-12 MED ORDER — DOCUSATE SODIUM 100 MG PO CAPS: 100.0000 mg | ORAL_CAPSULE | Freq: Two times a day (BID) | ORAL | Status: DC

## 2022-03-12 MED ORDER — METOCLOPRAMIDE HCL 5 MG/ML IJ SOLN: 5.0000 mg | Freq: Three times a day (TID) | INTRAMUSCULAR | Status: DC | PRN

## 2022-03-12 MED ORDER — ONDANSETRON HCL 4 MG/2ML IJ SOLN: 4.0000 mg | Freq: Four times a day (QID) | INTRAMUSCULAR | Status: DC | PRN

## 2022-03-12 MED ORDER — DONEPEZIL HCL 5 MG PO TABS
5.0000 mg | ORAL_TABLET | Freq: Every day | ORAL | Status: DC
  Administered 2022-03-12 – 2022-03-15 (×4): 5 mg via ORAL
  Filled 2022-03-12 (×4): qty 1

## 2022-03-12 MED ORDER — ONDANSETRON HCL 4 MG PO TABS: 4.0000 mg | ORAL_TABLET | Freq: Four times a day (QID) | ORAL | Status: DC | PRN

## 2022-03-12 MED ORDER — ACETAMINOPHEN 325 MG PO TABS
650.0000 mg | ORAL_TABLET | Freq: Three times a day (TID) | ORAL | Status: AC
  Administered 2022-03-12 – 2022-03-13 (×4): 650 mg via ORAL
  Filled 2022-03-12 (×4): qty 2

## 2022-03-12 MED ORDER — SODIUM CHLORIDE 0.9% IV SOLUTION: Freq: Once | INTRAVENOUS | Status: AC

## 2022-03-12 MED ORDER — MEMANTINE HCL 10 MG PO TABS
10.0000 mg | ORAL_TABLET | Freq: Two times a day (BID) | ORAL | Status: DC
  Administered 2022-03-12 – 2022-03-16 (×9): 10 mg via ORAL
  Filled 2022-03-12 (×9): qty 1

## 2022-03-12 MED ORDER — BUPROPION HCL ER (XL) 150 MG PO TB24
150.0000 mg | ORAL_TABLET | Freq: Every day | ORAL | Status: DC
Start: 2022-03-12 — End: 2022-03-16
  Administered 2022-03-12 – 2022-03-16 (×5): 150 mg via ORAL
  Filled 2022-03-12 (×5): qty 1

## 2022-03-12 MED ORDER — ATORVASTATIN CALCIUM 80 MG PO TABS
80.0000 mg | ORAL_TABLET | Freq: Every day | ORAL | Status: DC
  Administered 2022-03-12 – 2022-03-16 (×5): 80 mg via ORAL
  Filled 2022-03-12 (×5): qty 1

## 2022-03-12 MED ORDER — HYDROCODONE-ACETAMINOPHEN 5-325 MG PO TABS
1.0000 | ORAL_TABLET | Freq: Four times a day (QID) | ORAL | Status: DC | PRN
  Administered 2022-03-12 – 2022-03-15 (×6): 2 via ORAL
  Filled 2022-03-12 (×7): qty 2

## 2022-03-12 NOTE — Progress Notes (Signed)
Inpatient Rehab Admissions Coordinator Note:  ? ?Per therapy recommendations patient was screened for CIR candidacy by Michel Santee, PT. At this time, pt appears to be a potential candidate for CIR. I will place an order for rehab consult for full assessment, per our protocol.  Please contact me any with questions.. ? ?Shann Medal, PT, DPT ?[phone] ?03/12/22 ?2:48 PM  ?

## 2022-03-12 NOTE — Progress Notes (Signed)
? ?                              [facility] Progress Note ? ?Patient ID: ?Kevin [name] ?MRN: [id number] ?DOB/AGE: 1946-06-23 75 y.o. ? ?Subjective: ? ?Doing ok  ?Minimal pain in L shoulder  ?Eager to start therapy  ? ?No CP or SOB ? ?ROS ?As above ? ?Objective:  ? ?VITALS:   ?Vitals:  ? 03/11/22 1814 03/11/22 2009 03/12/22 8250 03/12/22 0836  ?BP: (!) 125/57 (!) 98/56 132/83 128/72  ?Pulse:  100 99 95  ?Resp: '18 18 17 18  '$ ?Temp: 97.6 ?F (36.4 ?C) 97.8 ?F (36.6 ?C) 98 ?F (36.7 ?C) 98.2 ?F (36.8 ?C)  ?TempSrc: Axillary Oral Oral   ?SpO2: 94% 94% 95% 95%  ?Weight:      ?Height:      ? ? ?Estimated body mass index is 18.25 kg/m? as calculated from the following: ?  Height as of this encounter: '6\' 4"'$  (1.93 m). ?  Weight as of this encounter: 68 kg. ? ? ?Intake/Output   ?   05/04 0701 ?05/05 0700 05/05 0701 ?05/06 0700  ? P.O. 240   ? I.V. (mL/kg) 750 (11)   ? IV Piggyback 700   ? Total Intake(mL/kg) 1690 (24.9)   ? Urine (mL/kg/hr) 700 (0.4)   ? Blood 250   ? Total Output 950   ? Net +740   ?     ?  ? ?LABS ? ?Results for orders placed or performed during the hospital encounter of 03/09/22 (from the past 24 hour(s))  ?Glucose, capillary     Status: Abnormal  ? Collection Time: 03/11/22 12:13 PM  ?Result Value Ref Range  ? Glucose-Capillary 100 (H) 70 - 99 mg/dL  ?Glucose, capillary     Status: Abnormal  ? Collection Time: 03/11/22  1:23 PM  ?Result Value Ref Range  ? Glucose-Capillary 119 (H) 70 - 99 mg/dL  ?Glucose, capillary     Status: Abnormal  ? Collection Time: 03/11/22  2:58 PM  ?Result Value Ref Range  ? Glucose-Capillary 112 (H) 70 - 99 mg/dL  ? Comment 1 Notify RN   ?Glucose, capillary     Status: Abnormal  ? Collection Time: 03/11/22  5:02 PM  ?Result Value Ref Range  ? Glucose-Capillary 158 (H) 70 - 99 mg/dL  ?Glucose, capillary     Status: Abnormal  ? Collection Time: 03/11/22  8:00 PM  ?Result Value Ref Range  ? Glucose-Capillary 280 (H) 70 - 99  mg/dL  ?Glucose, capillary     Status: Abnormal  ? Collection Time: 03/11/22 11:41 PM  ?Result Value Ref Range  ? Glucose-Capillary 183 (H) 70 - 99 mg/dL  ?CBC with Differential/Platelet     Status: Abnormal  ? Collection Time: 03/12/22  3:14 AM  ?Result Value Ref Range  ? WBC 9.0 4.0 - 10.5 K/uL  ? RBC 2.36 (L) 4.22 - 5.81 MIL/uL  ? Hemoglobin 7.8 (L) 13.0 - 17.0 g/dL  ? HCT 24.8 (L) 39.0 - 52.0 %  ? MCV 105.1 (H) 80.0 - 100.0 fL  ? MCH 33.1 26.0 - 34.0 pg  ? MCHC 31.5 30.0 - 36.0 g/dL  ? RDW 13.5 11.5 - 15.5 %  ? Platelets 110 (L) 150 - 400 K/uL  ? nRBC 0.0 0.0 - 0.2 %  ? Neutrophils Relative % 81 %  ? Neutro Abs 7.2 1.7 - 7.7 K/uL  ? Lymphocytes  Relative 9 %  ? Lymphs Abs 0.8 0.7 - 4.0 K/uL  ? Monocytes Relative 10 %  ? Monocytes Absolute 0.9 0.1 - 1.0 K/uL  ? Eosinophils Relative 0 %  ? Eosinophils Absolute 0.0 0.0 - 0.5 K/uL  ? Basophils Relative 0 %  ? Basophils Absolute 0.0 0.0 - 0.1 K/uL  ? Immature Granulocytes 0 %  ? Abs Immature Granulocytes 0.02 0.00 - 0.07 K/uL  ?Phosphorus     Status: None  ? Collection Time: 03/12/22  3:14 AM  ?Result Value Ref Range  ? Phosphorus 3.2 2.5 - 4.6 mg/dL  ?Comprehensive metabolic panel     Status: Abnormal  ? Collection Time: 03/12/22  3:14 AM  ?Result Value Ref Range  ? Sodium 142 135 - 145 mmol/L  ? Potassium 4.3 3.5 - 5.1 mmol/L  ? Chloride 108 98 - 111 mmol/L  ? CO2 26 22 - 32 mmol/L  ? Glucose, Bld 140 (H) 70 - 99 mg/dL  ? BUN 35 (H) 8 - 23 mg/dL  ? Creatinine, Ser 1.54 (H) 0.61 - 1.24 mg/dL  ? Calcium 8.4 (L) 8.9 - 10.3 mg/dL  ? Total Protein 5.7 (L) 6.5 - 8.1 g/dL  ? Albumin 3.3 (L) 3.5 - 5.0 g/dL  ? AST 18 15 - 41 U/L  ? ALT 8 0 - 44 U/L  ? Alkaline Phosphatase 53 38 - 126 U/L  ? Total Bilirubin 0.5 0.3 - 1.2 mg/dL  ? GFR, Estimated 46 (L) >60 mL/min  ? Anion gap 8 5 - 15  ?Magnesium     Status: None  ? Collection Time: 03/12/22  3:14 AM  ?Result Value Ref Range  ? Magnesium 2.2 1.7 - 2.4 mg/dL  ?Vitamin B12     Status: None  ? Collection Time: 03/12/22  3:14 AM   ?Result Value Ref Range  ? Vitamin B-12 277 180 - 914 pg/mL  ?Folate     Status: None  ? Collection Time: 03/12/22  3:14 AM  ?Result Value Ref Range  ? Folate 7.6 >5.9 ng/mL  ?Iron and TIBC     Status: Abnormal  ? Collection Time: 03/12/22  3:14 AM  ?Result Value Ref Range  ? Iron 20 (L) 45 - 182 ug/dL  ? TIBC 227 (L) 250 - 450 ug/dL  ? Saturation Ratios 9 (L) 17.9 - 39.5 %  ? UIBC 207 ug/dL  ?Ferritin     Status: None  ? Collection Time: 03/12/22  3:14 AM  ?Result Value Ref Range  ? Ferritin 280 24 - 336 ng/mL  ?Reticulocytes     Status: Abnormal  ? Collection Time: 03/12/22  3:14 AM  ?Result Value Ref Range  ? Retic Ct Pct 1.8 0.4 - 3.1 %  ? RBC. 2.37 (L) 4.22 - 5.81 MIL/uL  ? Retic Count, Absolute 43.1 19.0 - 186.0 K/uL  ? Immature Retic Fract 11.7 2.3 - 15.9 %  ?VITAMIN D 25 Hydroxy (Vit-D Deficiency, Fractures)     Status: None  ? Collection Time: 03/12/22  3:14 AM  ?Result Value Ref Range  ? Vit D, 25-Hydroxy 41.68 30 - 100 ng/mL  ?Glucose, capillary     Status: Abnormal  ? Collection Time: 03/12/22  4:46 AM  ?Result Value Ref Range  ? Glucose-Capillary 134 (H) 70 - 99 mg/dL  ?Glucose, capillary     Status: Abnormal  ? Collection Time: 03/12/22  8:31 AM  ?Result Value Ref Range  ? Glucose-Capillary 117 (H) 70 - 99 mg/dL  ? ? ? ?  PHYSICAL EXAM:  ? ?Gen: awake, alert, sitting up in bed, NAD, pleasant  ?Lungs: unlabored  ?Ext:  ?     Left Upper extremity  ? Dressing clean, dry and intact ? Minimal swelling left arm  ? Radial, ulnar, median nv motor and sensory functions intact ? Axillary nerve sensation intact ? Good ROM of digits, wrist, forearm and elbow ? + radial pulse ? Good perfusion distally  ? ? ?Assessment/Plan: ?1 Day Post-Op  ? ? ?Anti-infectives (From admission, onward)  ? ? Start     Dose/Rate Route Frequency Ordered Stop  ? 03/12/22 0613  ceFAZolin (ANCEF) IVPB 1 g/50 mL premix  Status:  Discontinued       ? 1 g ?100 mL/hr over 30 Minutes Intravenous Every 6 hours 03/12/22 0613 03/12/22 0616  ?  03/11/22 2100  ceFAZolin (ANCEF) IVPB 2g/100 mL premix       ? 2 g ?200 mL/hr over 30 Minutes Intravenous Every 8 hours 03/11/22 1646 03/12/22 0512  ? 03/11/22 1053  ceFAZolin (ANCEF) 2-4 GM/100ML-% IVPB       ?Note to Pharmacy: Cameron Sprang M: cabinet override  ?    03/11/22 1053 03/11/22 2259  ? 03/11/22 0600  ceFAZolin (ANCEF) IVPB 2g/100 mL premix       ? 2 g ?200 mL/hr over 30 Minutes Intravenous On call to O.R. 03/10/22 1609 03/10/22 1745  ? ?  ?. ? ?POD/HD#: 1 ? ?76 y/o male s/p fall with L proximal humerus fracture and L femoral neck fracture  ? ?-fall ? ?- L proximal humerus fracture s/p ORIF  ? NWB L UEx but can use platform walker to assist mobilization  ? Shoulder pendulums ? Passive and active shoulder flexion and extension  ? Passive shoulder abduction, NO ACTIVE SHOULDER ABDUCTION  ? Unrestricted ROM L elbow, forearm, wrist and hand  ? Sling for comfort ? Therapy evals ? Dressing changes starting tomorrow to L shoulder ?  Mepilex or similar  ? ?- L femoral neck fracture s/p left hip hemiarthroplasty by [name]  ? Per [name]  ? ?- Pain management: ? Multimodal  ? Minimize narcotics ? ?- ABL anemia/Hemodynamics ? Will transfuse 1 unit PRBCs ? Hold lovenox until H/H stable ? ?- Medical issues  ? Per primary  ? ?- DVT/PE prophylaxis: ? Home plavix ? Hold lovenox until h/h stable ? Scds ? ?- ID:  ? Periop abx ? ?- Metabolic Bone Disease: ? Fractures consistent with fragility fractures ? Recommend dexa as outpt ? Refer to osteoporosis clinic  ? Vitamin d levels are good  ? ?- Activity: ? As above ? ?- Impediments to fracture healing: ? Fragility fractures ? Elevated fall risk ? ?- Dispo: ? Ortho issues addressed  ? SW consult for SNF ? Follow up with [name] in 2 weeks  ? ?Jari Pigg, PA-C ?[phone] (C) ?03/12/2022, 9:12 AM ? ?[facility] ?[place] [address] ?[phone] Jenetta Downer) ?[phone] (F) ? ? ? ?After 5pm and on the weekends please log  on to Amion, go to orthopaedics and the look under the [facility] Call for the provider(s) on call. You can also call our office at [phone] and then follow the prompts to be connected to the

## 2022-03-12 NOTE — Progress Notes (Signed)
?PROGRESS NOTE ? ? ? ?Kevin [name]  LPF:[id number] DOB: 10-Oct-1946 DOA: 03/09/2022 ?PCP: [name], MD  ? 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  Patient states that he tripped on his trash can and the wife states that he usually uses a walker but did not do so at the time when he fell.  He did not hit his head and did not lose consciousness but after his fall he had pain in the left shoulder and left hip is brought to the ED.  X-rays in the ED showed a left hip fracture and a left proximal humerus fracture.  Orthopedic surgery was consulted and the plan is for surgical intervention of his left hip and this was done by [name] on 03/10/22 and he underwent left proximal humerus fractures s/p ORIF by [name] on 03/11/22.  PT OT evaluated and recommending CIR and he was screened and appears to be a potential candidate for CIR.  Per orthopedic surgery [name] he is to be weightbearing as tolerated left lower extremity with posterior hip precautions for 6 weeks and recommending Plavix and Lovenox for 4 weeks after surgery however given his acute drop from hemoglobin his Lovenox was held.  Given that his blood count dropped a little bit orthopedic surgery also type and screen and transfuse 1 unit of PRBCs.  Per orthopedic surgery he has to be nonweightbearing in the left upper extremity but can use a platform walker to assist with mobilization and have passive shoulder abduction but no active shoulder abduction with a sling for comfort  ? ? ?Assessment and Plan: ? ?Left hip fracture and left proximal humerus fracture after mechanical  fall  ?-Orthopedic surgery was consulted and appreciate their evaluation and management; plan is for surgical intervention of his left hip yesterday for hemiarthroplasty with him going back to the OR today for his left shoulder. ?-Continue with morphine 0.5 mg IV every 2 as needed for severe pain and Ondansetron 4 mg IV once ?-Patient had a nerve block ?-Further care per orthopedic surgery Teams  ?-Surgery recommending Lovenox starting postoperative day 1 for 4 weeks but this is held given that he underwent surgery with [name] and had an ORIF for his left proximal humerus fracture.  Given his acute blood loss anemia Lovenox has been held by the orthopedic surgery team ?-Orthopedic surgery recommending weightbearing as tolerated with posterior hip precautions for 6 weeks for his leg ?-Orthopedic surgery is also recommending nonweightbearing in left upper extremity using a platform walker to assist with mobilization and use shoulder pendulums and passive and active shoulder flexion and extension with passive shoulder abduction but no active shoulder abduction.  He has unrestricted range of motion of the left elbow forearm and wrist and hand and a sling for comfort. ?-Per orthopedic surgery dressing changes to start tomorrow to the left shoulder ?-PT OT recommending inpatient rehabilitation and he appears to be a potential CIR candidate ?  ?Chronic Combined Systolic and Diastolic CHF status post ICD placement  ?-Appears compensated.  Patient is not on any diuretics.   ?-Continue to monitor for S/Sx of Volume Overload ?-Patient is +1.4377 mL currently  ?  ?Diabetes Mellitus  Type 2 ?-Initiated on Sensitive NovoLog Sliding Scale every 4h ?-CBGs ranging from 100-140 ?-Continue to Monitor and Trend Blood Sugars carefully  ?  ?Chronic Kidney Disease Stage IIIa  ?-Creatinine usually is around 1.1-1.3. ?-Patient's BUNs/creatinine went from 20/1.40 -> 17/1.34 -> 25/1.40 -> 35/1.54 (GFR 46) ?-Avoid further nephrotoxic  medications, contrast dyes, hypotension and dehydration to ensure adequate renal perfusion and will need to renally adjust medications ?-Repeat CMP in the a.m. ?  ?Hypertension  ?-Presently not on any medication.  Follow closely.   ?-Will place on As needed IV hydralazine for systolic blood pressure more than 160. ?-Continue to Monitor BP per Protocol ?-Last BP was 120/66 ?  ?Leukocytosis, slightly worsening ?-Patient's WBC went from 10.2 -> 11.9 -> 13.7 -> 9.0 ?-Likely reactive in the setting of his fall and now Surgical Intervention ?-U/A done and showed clear appearance with yellow color urine, Small Leukocytes, Negative Nitrites, many bacteria, present mucus, 0-5 RBCs per high-power field, 21-50 WBCs ?-Continue to monitor for signs and symptoms of infection; no overt infection noted ?-Repeat CBC in a.m. ?  ?History of Prior Stroke ?-He is on Plavix and this was held but now is okay to resume per orthopedic recommendation is now back on 75 mg p.o. daily ?  ?Hyperkalemia ?-Patient's K+ went from 4.4 -> 5.3 -> 4.3 ?-Given 10 grams of Lokelma x1 with improvement  ?-Continue to Monitor and Trend ?-Repeat CMP in the AM  ?  ?Macrocytic Anemia with superimposed Acute Blood Loss Anemia from Post-Operative Cause ?-Patient's Hgb/Hct went from 12.2/36.0 -> 11.0/33.9 -> 9.9/30.6  -> 7.8/24.8 with an MCV of 105.1 ?-Checked Anemia Panel and showed an iron level of 20, U IBC of 207, TIBC of 227, saturation ratio 9%, ferritin level of 280, folate level 70.6, and a vitamin B12 level of 277 ?-Given his history of CHF and acute blood loss orthopedic surgery is typing and screening and transfusing 1 unit of PRBCs ?-Continue to Monitor for S/Sx of Bleeding; No overt bleeding noted ?-Repeat CBC in the AM  ?  ?Thrombocytopenia ?-Patient's Platelet Count went from 179 -> 156 -> 142 -> 110 ?-Continue to Monitor for S/Sx of Bleeding; No overt Bleeding noted ?-Repeat CBC in the AM  ?  ?Hypoalbuminemia ?-Patient's Albumin Level was 3.2 and  went to 3.3 ?-Continue to Monitor and Trend ?-Repeat CMP in the AM  ?  ?Dementia  ?-No acute issues at this time.  Resume home medications after surgery when patient can take p.o. and have now resumed his donepezil 5 mg p.o. nightly ?-He was resumed on his home Mirtazapine 15 mg p.o. nightly ?  ?DVT prophylaxis: SCDs Start: 03/12/22 0612 ?SCD's Start: 03/12/22 0612 ?SCDs Start: 03/09/22 2302 ? ?  Code Status: DNR ?Family Communication: No family currently at bedside ? ?Disposition Plan:  ?Level of care: Telemetry Medical ?Status is: Inpatient ?Remains inpatient appropriate because: Needs CIR Placement ?  ?Consultants:  ?Orthopedic Surgery [name]  ?Orthopedic Surgery [name] ?CIR ? ?Procedures:  ?ARTHROPLASTY BIPOLAR HIP (HEMIARTHROPLASTY) ?L proximal humerus fracture s/p ORIF  ? ?Antimicrobials:  ?Anti-infectives (From admission, onward)  ? ? Start     Dose/Rate Route Frequency Ordered Stop  ? 03/12/22 0613  ceFAZolin (ANCEF) IVPB 1 g/50 mL premix  Status:  Discontinued       ? 1 g ?100 mL/hr over 30 Minutes Intravenous Every 6 hours 03/12/22 0613 03/12/22 0616  ? 03/11/22 2100  ceFAZolin (ANCEF) IVPB 2g/100 mL premix       ? 2  g ?200 mL/hr over 30 Minutes Intravenous Every 8 hours 03/11/22 1646 03/12/22 0512  ? 03/11/22 1053  ceFAZolin (ANCEF) 2-4 GM/100ML-% IVPB       ?Note to Pharmacy: Cameron Sprang M: cabinet override  ?    03/11/22 1053 03/11/22 2259  ? 03/11/22 0600  ceFAZolin (ANCEF) IVPB 2g/100 mL premix       ? 2 g ?200 mL/hr over 30 Minutes Intravenous On call to O.R. 03/10/22 1609 03/10/22 1745  ? ?  ?  ?Subjective: ?And examined at bedside and he was doing okay and thinks his pain is fairly well controlled but was more concerned about trying to get in contact with his wife.  Since his phone died and he is very upset about that as she is try to call him.  He denies any nausea or vomiting.  Denies any chest pain or shortness of breath.  No other concerns or complaints at this  time. ? ?Objective: ?Vitals:  ? 03/12/22 0836 03/12/22 1100 03/12/22 1155 03/12/22 1352  ?BP: 128/72 131/82 122/63 120/66  ?Pulse: 95 (!) 105 (!) 101 76  ?Resp: '18 20 19 20  '$ ?Temp: 98.2 ?F (36.8 ?C) 97.9 ?F (36.6 ?C) (!) 97.2

## 2022-03-12 NOTE — Evaluation (Signed)
Physical Therapy Evaluation Patient Details Name: Kevin [name] MRN: [id number] DOB: [birth year] Today's Date: 03/12/2022  History of Present Illness  Pt is a 76 y.o. male admitted 03/09/22 after syncopal episode and fall while ambulating without his RW. Pt sustained L humeral and L femoral neck fxs. S/p L hip hemiarthroplasty 5/3. S/p L shoulder ORIF 5/4. PMH includes dementia, DM2, prostate CA, LBBB, nonischemic cardiomyopathy, pacemaker; of note, multiple falls with recent admissions to acute and AIR (11/2021).   Clinical Impression  Pt presents with an overall decrease in functional mobility secondary to above. PTA, pt mod indep ambulating with RW, lives with wife who assists with ADL/iADLs as needed; pt with h/o falls. Initiated educ re: LUE/LLE precautions, positioning, therex, activity recommendations and importance of mobility. Today, pt able to initiate transfer and gait training with RW and modA+2. Pt limited by generalized weakness, post-op pain and weakness, decreased activity tolerance, poor balance strategies/postural reactions and impaired cognition. Pt would benefit from intensive AIR-level therapies to maximize functional mobility and independence prior to return home.      Recommendations for follow up therapy are one component of a multi-disciplinary discharge planning process, led by the attending physician.  Recommendations may be updated based on patient status, additional functional criteria and insurance authorization.  Follow Up Recommendations Acute inpatient rehab (3hours/day)    Assistance Recommended at Discharge Frequent or constant Supervision/Assistance  Patient can return home with the following  A lot of help with walking and/or transfers;A lot of help with bathing/dressing/bathroom;Assistance with cooking/housework;Assist for transportation;Help with stairs or ramp for entrance;Direct supervision/assist for medications management;Direct supervision/assist for  financial management    Equipment Recommendations Wheelchair (measurements PT) (LUE platform walker)  Recommendations for Other Services    Rehab consult   Functional Status Assessment Patient has had a recent decline in their functional status and demonstrates the ability to make significant improvements in function in a reasonable and predictable amount of time.     Precautions / Restrictions Precautions Precautions: Posterior Hip;Shoulder;Fall Shoulder Interventions: Shoulder sling/immobilizer;For comfort Precaution Booklet Issued: Yes (comment) Precaution Comments: NWB but OK for platform RW, AROM/PROM for shoulder extension/flexion, PROM shoulder abduction (NO ACTIVE ABDUCTION), pendulums OK Required Braces or Orthoses: Sling Restrictions Weight Bearing Restrictions: Yes LUE Weight Bearing: Non weight bearing (OK for platform walker with mobility/transfers) LLE Weight Bearing: Weight bearing as tolerated Other Position/Activity Restrictions: posterior hip precautions      Mobility  Bed Mobility Overal bed mobility: Needs Assistance Bed Mobility: Supine to Sit     Supine to sit: Mod assist, +2 for physical assistance, HOB elevated     General bed mobility comments: ModA for trnk elevation and scooting hips to EOB, frequent verbal cues and external assist to maintain LLE posterior hip precuations and LUE NWB    Transfers Overall transfer level: Needs assistance Equipment used: 1 person hand held assist, Left platform walker Transfers: Sit to/from Stand, Bed to chair/wheelchair/BSC Sit to Stand: +2 physical assistance, +2 safety/equipment, Max assist, Mod assist   Step pivot transfers: Mod assist, +2 physical assistance, +2 safety/equipment       General transfer comment: initial maxA for trunk elevation and stability standing from elevated bed height, verbal cues for upright posture and assist to place LUE on platform RW; pivotal steps from bed to recliner with  modA+2, pt able to take complete steps with BLEs, noted L knee instability; additional sit<>stand from low recliner height, modA+2 for trunk elevation and stability; maxA for eccentric lowering to sit, max verbal  cues for sequencing and hand placement    Ambulation/Gait                  Stairs            Wheelchair Mobility    Modified Rankin (Stroke Patients Only)       Balance Overall balance assessment: Needs assistance Sitting-balance support: Single extremity supported, Feet supported Sitting balance-Leahy Scale: Fair     Standing balance support: Bilateral upper extremity supported, Reliant on assistive device for balance Standing balance-Leahy Scale: Poor                               Pertinent Vitals/Pain Pain Assessment Pain Assessment: Faces Faces Pain Scale: Hurts even more Pain Location: L shoulder and hip Pain Descriptors / Indicators: Discomfort, Grimacing, Sore Pain Intervention(s): Limited activity within patient's tolerance, Monitored during session, Repositioned    Home Living Family/patient expects to be discharged to:: Private residence Living Arrangements: Spouse/significant other Available Help at Discharge: Family;Available 24 hours/day Type of Home: House Home Access: Stairs to enter Entrance Stairs-Rails: Right;Left;Can reach both Entrance Stairs-Number of Steps: 2   Home Layout: One level Home Equipment: Agricultural consultant (2 wheels);Shower seat;Grab bars - tub/shower;Rollator (4 wheels);Hand held shower head      Prior Function Prior Level of Function : Needs assist;History of Falls (last six months)             Mobility Comments: Mod indep with RW; wife reports he has not worked as [employer] since 08/2021. Wife reports pt frequently tries to leave RW behind when she is not watching ADLs Comments: Intermittent assist from wife for bathing and LB dressing     Hand Dominance   Dominant Hand:  Right    Extremity/Trunk Assessment   Upper Extremity Assessment Upper Extremity Assessment: LUE deficits/detail LUE Deficits / Details: Put in sling for comfort and as visual reminder for movement precautions; wrist and elbow AROM WFL LUE Sensation: WNL LUE Coordination: decreased gross motor    Lower Extremity Assessment Lower Extremity Assessment: Generalized weakness;LLE deficits/detail LLE Deficits / Details: s/p L hip hemiarthroplasty; functionally hip and knee </ 3/5 strength with expected post-op pain and weakness    Cervical / Trunk Assessment Cervical / Trunk Assessment:  (fwd head and shoulders)  Communication   Communication: No difficulties  Cognition Arousal/Alertness: Awake/alert Behavior During Therapy: WFL for tasks assessed/performed Overall Cognitive Status: History of cognitive impairments - at baseline                                 General Comments: Pt with dementia, unreliable historian - 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 Poor STM and recall of precautions, at baseline frequently walks away from his RW per wife        General Comments General comments (skin integrity, edema, etc.): called and spoke with wife [name] after session to confirm home set up and PLOF    Exercises General Exercises - Lower Extremity Ankle Circles/Pumps: AROM, Both, Seated Long Arc Quad: AROM, Both, Seated (L knee lacking full extension)   Assessment/Plan    PT Assessment Patient needs continued PT services  PT Problem List Decreased strength;Decreased range of motion;Decreased activity tolerance;Decreased balance;Decreased mobility;Decreased cognition;Decreased knowledge of use of DME;Decreased safety awareness;Decreased knowledge of precautions;Pain  PT Treatment Interventions DME instruction;Gait training;Stair training;Functional mobility training;Therapeutic activities;Therapeutic exercise;Balance  training;Patient/family education    PT Goals (Current goals can be found in the Care Plan section)  Acute Rehab PT Goals Patient Stated Goal: post-acute re[facility] at [facility] before return home PT Goal Formulation: With patient Time For Goal Achievement: 03/26/22 Potential to Achieve Goals: Good    Frequency Min 5X/week     Co-evaluation PT/OT/SLP Co-Evaluation/Treatment: Yes Reason for Co-Treatment: Complexity of the patient's impairments (multi-system involvement);Necessary to address cognition/behavior during functional activity;For patient/therapist safety;To address functional/ADL transfers PT goals addressed during session: Mobility/safety with mobility;Balance;Proper use of DME;Strengthening/ROM OT goals addressed during session: ADL's and self-care;Proper use of Adaptive equipment and DME;Strengthening/ROM       AM-PAC PT "6 Clicks" Mobility  Outcome Measure Help needed turning from your back to your side while in a flat bed without using bedrails?: A Lot Help needed moving from lying on your back to sitting on the side of a flat bed without using bedrails?: Total Help needed moving to and from a bed to a chair (including a wheelchair)?: Total Help needed standing up from a chair using your arms (e.g., wheelchair or bedside chair)?: Total Help needed to walk in hospital room?: Total Help needed climbing 3-5 steps with a railing? : Total 6 Click Score: 7    End of Session Equipment Utilized During Treatment: Gait belt Activity Tolerance: Patient tolerated treatment well Patient left: in chair;with call bell/phone within reach;with chair alarm set Nurse Communication: Mobility status PT Visit Diagnosis: Other abnormalities of gait and mobility (R26.89);Muscle weakness (generalized) (M62.81);Pain;Repeated falls (R29.6) Pain - Right/Left: Left    Time: 1031-1100 PT Time Calculation (min) (ACUTE ONLY): 29 min   Charges:   PT Evaluation $PT Eval Moderate Complexity: 1 Mod         Ina Homes, PT, DPT Acute Rehabilitation Services  Pager [phone] Office [phone]  Malachy Chamber 03/12/2022, 1:31 PM

## 2022-03-12 NOTE — Anesthesia Postprocedure Evaluation (Signed)
Anesthesia Post Note ? ?Patient: Kevin [name] ? ?Procedure(s) Performed: ARTHROPLASTY BIPOLAR HIP (HEMIARTHROPLASTY) (Left: Hip) ? ?  ? ?Patient location during evaluation: PACU ?Anesthesia Type: General ?Level of consciousness: patient cooperative and awake ?Pain management: pain level controlled ?Vital Signs Assessment: post-procedure vital signs reviewed and stable ?Respiratory status: spontaneous breathing, nonlabored ventilation, respiratory function stable and patient connected to nasal cannula oxygen ?Cardiovascular status: blood pressure returned to baseline and stable ?Postop Assessment: no apparent nausea or vomiting ?Anesthetic complications: no ? ? ?No notable events documented. ? ?Last Vitals:  ?Vitals:  ? 03/11/22 2009 03/12/22 0449  ?BP: (!) 98/56 132/83  ?Pulse: 100 99  ?Resp: 18 17  ?Temp: 36.6 ?C 36.7 ?C  ?SpO2: 94% 95%  ?  ?Last Pain:  ?Vitals:  ? 03/12/22 0506  ?TempSrc:   ?PainSc: Asleep  ? ? ?  ?  ?  ?  ?  ?  ? ?[name] ? ? ? ? ?

## 2022-03-12 NOTE — Telephone Encounter (Signed)
Erroneous encounter - please disregard.

## 2022-03-12 NOTE — Evaluation (Signed)
Occupational Therapy Evaluation ?Patient Details ?Name: Kevin [name] ?MRN: [id number] ?DOB: 20-Jan-1946 ?Today's Date: 03/12/2022 ? ? ?History of Present Illness Pt is a 76 y.o. male admitted 03/09/22 after syncopal episode and fall while ambulating without his RW. Pt sustained L humeral and L femoral neck fxs. S/p L hip hemiarthroplasty 5/3. S/p L shoulder ORIF 5/4. PMH includes dementia, DM2, prostate CA, LBBB, nonischemic cardiomyopathy, pacemaker; of note, multiple falls with recent admissions to acute and AIR (11/2021).  ? ?Clinical Impression ?  ?Pt is typically mod I with RW for mobility, sits for bathing with min A for back, gets PRN assist for socks/shoes but otherwise mod I for ADL. Wife performs IADL. Pt likes volunteering at [facility] (has been since Oct 2022), and golf. Today Pt is pleasant, cooperative, poor historian but eager to work with therapies. Pt max A for LB ADL at this time (esp the knees down), max A for UB dressing (to don sling), min A for other UB ADL - although Pt is right handed (non-dominant hand affected). Pt put in sling throughout session to provide support for LUE, as well as visual reminder for ROM/WB restrictions. Reviewed compensatory strategies and sequencing for ADL - also in handout provided. Pt also has posterior hip precautions and provided handout as well as posted in room - Pt educated. Pt mod A +2 for sit<>stand from elevated bed, and recliner. Mod A +2 for step pivot transfer - able to lift each foot off the floor for small steps while turning to the left. Pt with excellent care at home in his wife who is available 24/7 at dc. At this time recommending AIR level therapy to progress to level where he will be able to function as independently as possible with assist for wife. Pt demonstrates motivation, and excellent activity tolerance for first session. Next session to focus on LUE exercises (educated and provided handout - but not performed today other than hand,  wrist, elbow) OT will continue to follow acutely.  ?   ? ?Recommendations for follow up therapy are one component of a multi-disciplinary discharge planning process, led by the attending physician.  Recommendations may be updated based on patient status, additional functional criteria and insurance authorization.  ? ?Follow Up Recommendations ? Acute inpatient rehab (3hours/day)  ?  ?Assistance Recommended at Discharge Frequent or constant Supervision/Assistance  ?Patient can return home with the following Two people to help with walking and/or transfers;A lot of help with bathing/dressing/bathroom;Assistance with cooking/housework;Assistance with feeding;Direct supervision/assist for medications management;Direct supervision/assist for financial management;Assist for transportation;Help with stairs or ramp for entrance ? ?  ?Functional Status Assessment ? Patient has had a recent decline in their functional status and demonstrates the ability to make significant improvements in function in a reasonable and predictable amount of time.  ?Equipment Recommendations ? Other (comment) (defer to next venue of care)  ?  ?Recommendations for Other Services Rehab consult;PT consult ? ? ?  ?Precautions / Restrictions Precautions ?Precautions: Posterior Hip;Shoulder;Fall ?Shoulder Interventions: Shoulder sling/immobilizer;For comfort ?Precaution Booklet Issued: Yes (comment) ?Precaution Comments: NWB but OK for platform RW, AROM/PROM for shoulder extension/flexion, PROM shoulder abduction (NO ACTIVE ABDUCTION), pendulums OK ?Required Braces or Orthoses: Sling ?Restrictions ?Weight Bearing Restrictions: Yes ?LUE Weight Bearing: Non weight bearing (OK for platform walker with mobility/transfers) ?LLE Weight Bearing: Weight bearing as tolerated ?Other Position/Activity Restrictions: posterior hip precautions  ? ?  ? ?Mobility Bed Mobility ?Overal bed mobility: Needs Assistance ?Bed Mobility: Sidelying to Sit, Rolling ?Rolling:  Mod assist, +  2 for safety/equipment ?Sidelying to sit: Mod assist, +2 for physical assistance, +2 for safety/equipment, HOB elevated ?  ?  ?  ?General bed mobility comments: vc for sequencing, mod A for physical assist ?  ? ?Transfers ?Overall transfer level: Needs assistance ?Equipment used: Left platform walker ?Transfers: Sit to/from Stand, Bed to chair/wheelchair/BSC ?Sit to Stand: Mod assist, +2 physical assistance, +2 safety/equipment, From elevated surface ?  ?  ?Step pivot transfers: Mod assist, +2 physical assistance, +2 safety/equipment, From elevated surface ?  ?  ?General transfer comment: Pt able to pick up BLE for small steps, vc for posture, hip precautions, big boost to come upright even from elevated bed, gait belt helpful for support ?  ? ?  ?Balance Overall balance assessment: Needs assistance ?Sitting-balance support: Single extremity supported, Feet supported ?Sitting balance-Leahy Scale: Fair ?Sitting balance - Comments: unchallenged sitting EOB ?  ?Standing balance support: Bilateral upper extremity supported, Reliant on assistive device for balance ?Standing balance-Leahy Scale: Poor ?  ?  ?  ?  ?  ?  ?  ?  ?  ?  ?  ?  ?   ? ?ADL either performed or assessed with clinical judgement  ? ?ADL Overall ADL's : Needs assistance/impaired ?Eating/Feeding: Set up;Sitting ?Eating/Feeding Details (indicate cue type and reason): r hand dominant ?Grooming: Minimal assistance;Sitting ?Grooming Details (indicate cue type and reason): for BUE activities ?Upper Body Bathing: Maximal assistance ?  ?Lower Body Bathing: Maximal assistance ?  ?Upper Body Dressing : Maximal assistance ?  ?Lower Body Dressing: Maximal assistance ?  ?Toilet Transfer: Moderate assistance;+2 for physical assistance;+2 for safety/equipment;Stand-pivot;Rolling walker (2 wheels) (L platform walker) ?Toilet Transfer Details (indicate cue type and reason): transferred to the left ?Toileting- Clothing Manipulation and Hygiene: Maximal  assistance ?  ?  ?  ?Functional mobility during ADLs: Moderate assistance;+2 for physical assistance;+2 for safety/equipment (L platform RW) ?General ADL Comments: provided shoulder handout to assist with ADL  ? ? ? ?Vision   ?   ?   ?Perception   ?  ?Praxis   ?  ? ?Pertinent Vitals/Pain Pain Assessment ?Pain Assessment: Faces ?Faces Pain Scale: Hurts even more ?Pain Location: L shoulder and hip ?Pain Descriptors / Indicators: Discomfort, Grimacing, Sore ?Pain Intervention(s): Limited activity within patient's tolerance, Monitored during session, Repositioned  ? ? ? ?Hand Dominance Right ?  ?Extremity/Trunk Assessment Upper Extremity Assessment ?Upper Extremity Assessment: LUE deficits/detail ?LUE Deficits / Details: Put in sling for comfort and as visual reminder for movement precautions ?LUE Sensation: WNL ?LUE Coordination: decreased gross motor ?  ?Lower Extremity Assessment ?Lower Extremity Assessment: LLE deficits/detail;Defer to PT evaluation ?  ?Cervical / Trunk Assessment ?Cervical / Trunk Assessment:  (fwd head and shoulders) ?  ?Communication Communication ?Communication: No difficulties ?  ?Cognition Arousal/Alertness: Awake/alert ?Behavior During Therapy: [facility] for tasks assessed/performed ?Overall Cognitive Status: History of cognitive impairments - at baseline ?  ?  ?  ?  ?  ?  ?  ?  ?  ?  ?  ?  ?  ?  ?  ?  ?General Comments: Pt with dementia, unreliable historian - loved being a Psychologist, occupational at [employer] but he has not done this since Oct 2022. Poor STM and recall of precautions, at baseline frequently walks away from his RW ?  ?  ?General Comments  called and spoke with wife [name] after session to confirm home set up and PLOF ? ?  ?Exercises Exercises: Shoulder ?Shoulder Exercises ?Pendulum Exercise: Left, 10 reps, Standing (educated,  not performed) ?Shoulder Flexion: PROM, AROM, Left (educated, not performed) ?Shoulder Extension: PROM, AROM, Left (educated, not performed) ?Shoulder ABduction: PROM, Left  (educated, not performed - NO ACTIVE ROM) ?Elbow Flexion: AROM, Left, 10 reps, Seated ?Elbow Extension: AROM, Left, 10 reps, Seated ?Wrist Flexion: AROM, Left, Seated ?Wrist Extension: AROM, Left, Seated ?Digit Compos

## 2022-03-12 NOTE — Progress Notes (Signed)
? ? ? ?  Subjective: ? ?Patient reports pain as mild.  No issues overnight.  Underwent left humerus fixation with [name] yesterday.  Denies distal numbness and tingling.  Plan to work with therapy today.  No concerns. ? ?Objective:  ? ?VITALS:   ?Vitals:  ? 03/11/22 1653 03/11/22 1814 03/11/22 2009 03/12/22 0449  ?BP: (!) 120/55 (!) 125/57 (!) 98/56 132/83  ?Pulse:   100 99  ?Resp: '20 18 18 17  '$ ?Temp: (!) 97.2 ?F (36.2 ?C) 97.6 ?F (36.4 ?C) 97.8 ?F (36.6 ?C) 98 ?F (36.7 ?C)  ?TempSrc: Oral Axillary Oral Oral  ?SpO2: 94% 94% 94% 95%  ?Weight:      ?Height:      ? ? ?Sensation intact distally ?Intact pulses distally ?Dorsiflexion/Plantar flexion intact ?Incision: dressing C/D/I ?Compartment soft ?  ? ?Lab Results  ?Component Value Date  ? WBC 9.0 03/12/2022  ? HGB 7.8 (L) 03/12/2022  ? HCT 24.8 (L) 03/12/2022  ? MCV 105.1 (H) 03/12/2022  ? PLT 110 (L) 03/12/2022  ? ?BMET ?   ?Component Value Date/Time  ? NA 142 03/12/2022 0314  ? NA 141 08/15/2020 1006  ? K 4.3 03/12/2022 0314  ? CL 108 03/12/2022 0314  ? CO2 26 03/12/2022 0314  ? GLUCOSE 140 (H) 03/12/2022 0314  ? BUN 35 (H) 03/12/2022 0314  ? BUN 13 08/15/2020 1006  ? CREATININE 1.54 (H) 03/12/2022 0314  ? CREATININE 1.15 09/02/2016 0819  ? CALCIUM 8.4 (L) 03/12/2022 0314  ? GFRNONAA 46 (L) 03/12/2022 0314  ? ? ? ? ?Xray: Postop x-rays demonstrate cemented total hip arthroplasty without adverse features. ? ?Assessment/Plan: ?1 Day Post-Op  ? ?Principal Problem: ?  Hip fracture ([place]) ?Active Problems: ?  Type 2 diabetes mellitus ([place]) ?  Sick sinus syndrome ([place]) ?  Chronic combined systolic and diastolic heart failure ([place]) ?  Dementia ([place]) ?  DNR (do not resuscitate) ?  S/P hip replacement, left ?  Fracture of neck of left humerus, closed, initial encounter ?  Traumatic closed displaced fracture of base of neck of left femur, initial encounter ([place]) ? ? ?Left hip cemented hemiarthroplasty for femoral neck fracture 03/10/22 ?Left proximal humerus fracture ORIF with  [name] 5/4 ? ?  ?Post op recs: ?WB: WBAT LLE with posterior hip precautions x6 weeks ?Abx: ancef x23 hours post op ?Imaging: PACU xrays ?Dressing: Aquacel dressing to be kept intact until follow-up ?DVT prophylaxis: Plavix + lovenox starting POD1 x4 weeks ?Follow up: 2 weeks after surgery for a wound check with [name] at [facility].  ?Address: [address] [place], [place], [place] [address]  ?Office Phone: [phone] ?  ? ?Darshana Curnutt A Yunique Dearcos ?03/12/2022, 7:07 AM ? ? ?Charlies Constable, MD ? ?Contact information:   ?Weekdays 7am-5pm epic message [name], or call office for patient follow up: [phone] ?After hours and holidays please check [email or web address] for group call information for [facility] ? ?  ?

## 2022-03-12 NOTE — Progress Notes (Signed)
Nutrition Follow-up ? ?DOCUMENTATION CODES:  ? ?Severe malnutrition in context of chronic illness, Underweight ? ?INTERVENTION:  ?Continue Ensure Enlive po BID, each supplement provides 350 kcal and 20 grams of protein. ? ?Encourage adequate PO intake.  ? ?NUTRITION DIAGNOSIS:  ? ?Severe Malnutrition related to chronic illness (CHF) as evidenced by severe fat depletion, severe muscle depletion; ongoing ? ?GOAL:  ? ?Patient will meet greater than or equal to 90% of their needs; progressing ? ?MONITOR:  ? ?PO intake, Supplement acceptance, Labs, Weight trends, Skin, I & O's ? ?REASON FOR ASSESSMENT:  ? ?Consult ?Assessment of nutrition requirement/status, Hip fracture protocol ? ?ASSESSMENT:  ? ?76 year old male with PMH DM, CHF, dementia presents after syncopal event and fall. X-rays showed humeral and femoral neck fractures. ? ?5/3 - s/p left hip cemented hemiarthroplasty for femoral neck fracture  ?5/4 - s/p left proximal humerus fracture ORIF ? ?Meal completion has been 100%. Pt reports having a good appetite currently and PTA with usual consumption of at least 3 meals a day with no difficulties. No significant weight loss per weight records. Pt currently has Ensure ordered and has been consuming them. RD to continue with current orders to aid in caloric and protein needs.  ? ?NUTRITION - FOCUSED PHYSICAL EXAM: ? ?Flowsheet Row Most Recent Value  ?Orbital Region Severe depletion  ?Upper Arm Region Moderate depletion  ?Thoracic and Lumbar Region Moderate depletion  ?Buccal Region Severe depletion  ?Temple Region Severe depletion  ?Clavicle Bone Region Severe depletion  ?Clavicle and Acromion Bone Region Severe depletion  ?Scapular Bone Region Unable to assess  ?Dorsal Hand Unable to assess  ?Patellar Region Moderate depletion  ?Anterior Thigh Region Severe depletion  ?Posterior Calf Region Moderate depletion  ?Edema (RD Assessment) None  ?Hair Reviewed  ?Eyes Reviewed  ?Mouth Reviewed  ?Skin Reviewed  ?Nails  Reviewed  ? ?  ? ?Labs and medications reviewed.  ? ?Diet Order:   ?Diet Order   ? ?       ?  Diet Carb Modified Fluid consistency: Thin; Room service appropriate? Yes  Diet effective now       ?  ? ?  ?  ? ?  ? ? ?EDUCATION NEEDS:  ? ?Not appropriate for education at this time ? ?Skin:  Skin Assessment: Reviewed RN Assessment ? ?Last BM:  Unknown ? ?Height:  ? ?Ht Readings from Last 1 Encounters:  ?03/11/22 '6\' 4"'$  (1.93 m)  ? ? ?Weight:  ? ?Wt Readings from Last 1 Encounters:  ?03/11/22 68 kg  ? ?BMI:  Body mass index is 18.25 kg/m?. ? ?Estimated Nutritional Needs:  ? ?Kcal:  2000-2200 ? ?Protein:  100-110 grams ? ?Fluid:  >/= 2 L/day ? ?Corrin Parker, MS, RD, LDN ?RD pager number/after hours weekend pager number on Amion. ? ?

## 2022-03-13 DIAGNOSIS — I5042 Chronic combined systolic (congestive) and diastolic (congestive) heart failure: Secondary | ICD-10-CM | POA: Diagnosis not present

## 2022-03-13 DIAGNOSIS — S42212A Unspecified displaced fracture of surgical neck of left humerus, initial encounter for closed fracture: Secondary | ICD-10-CM | POA: Diagnosis not present

## 2022-03-13 DIAGNOSIS — S72001A Fracture of unspecified part of neck of right femur, initial encounter for closed fracture: Secondary | ICD-10-CM | POA: Diagnosis not present

## 2022-03-13 DIAGNOSIS — S72042A Displaced fracture of base of neck of left femur, initial encounter for closed fracture: Secondary | ICD-10-CM | POA: Diagnosis not present

## 2022-03-13 LAB — BPAM RBC
Blood Product Expiration Date: 202306052359
ISSUE DATE / TIME: 202305051122
Unit Type and Rh: 6200

## 2022-03-13 LAB — COMPREHENSIVE METABOLIC PANEL
ALT: 6 U/L (ref 0–44)
AST: 16 U/L (ref 15–41)
Albumin: 3.1 g/dL — ABNORMAL LOW (ref 3.5–5.0)
Alkaline Phosphatase: 57 U/L (ref 38–126)
Anion gap: 5 (ref 5–15)
BUN: 31 mg/dL — ABNORMAL HIGH (ref 8–23)
CO2: 26 mmol/L (ref 22–32)
Calcium: 8.3 mg/dL — ABNORMAL LOW (ref 8.9–10.3)
Chloride: 110 mmol/L (ref 98–111)
Creatinine, Ser: 1.48 mg/dL — ABNORMAL HIGH (ref 0.61–1.24)
GFR, Estimated: 49 mL/min — ABNORMAL LOW (ref >60)
Glucose, Bld: 146 mg/dL — ABNORMAL HIGH (ref 70–99)
Potassium: 3.8 mmol/L (ref 3.5–5.1)
Sodium: 141 mmol/L (ref 135–145)
Total Bilirubin: 0.8 mg/dL (ref 0.3–1.2)
Total Protein: 5.8 g/dL — ABNORMAL LOW (ref 6.5–8.1)

## 2022-03-13 LAB — CBC WITH DIFFERENTIAL/PLATELET
Abs Immature Granulocytes: 0.02 10*3/uL (ref 0.00–0.07)
Basophils Absolute: 0 10*3/uL (ref 0.0–0.1)
Basophils Relative: 0 %
Eosinophils Absolute: 0.1 10*3/uL (ref 0.0–0.5)
Eosinophils Relative: 1 %
HCT: 28.9 % — ABNORMAL LOW (ref 39.0–52.0)
Hemoglobin: 9.4 g/dL — ABNORMAL LOW (ref 13.0–17.0)
Immature Granulocytes: 0 %
Lymphocytes Relative: 14 %
Lymphs Abs: 1.1 10*3/uL (ref 0.7–4.0)
MCH: 33 pg (ref 26.0–34.0)
MCHC: 32.5 g/dL (ref 30.0–36.0)
MCV: 101.4 fL — ABNORMAL HIGH (ref 80.0–100.0)
Monocytes Absolute: 0.8 10*3/uL (ref 0.1–1.0)
Monocytes Relative: 10 %
Neutro Abs: 5.8 10*3/uL (ref 1.7–7.7)
Neutrophils Relative %: 75 %
Platelets: 116 10*3/uL — ABNORMAL LOW (ref 150–400)
RBC: 2.85 MIL/uL — ABNORMAL LOW (ref 4.22–5.81)
RDW: 14.8 % (ref 11.5–15.5)
WBC: 7.7 10*3/uL (ref 4.0–10.5)
nRBC: 0 % (ref 0.0–0.2)

## 2022-03-13 LAB — TYPE AND SCREEN
ABO/RH(D): A POS
ABO/RH(D): A POS
Antibody Screen: NEGATIVE
Antibody Screen: NEGATIVE
Unit division: 0

## 2022-03-13 LAB — GLUCOSE, CAPILLARY
Glucose-Capillary: 138 mg/dL — ABNORMAL HIGH (ref 70–99)
Glucose-Capillary: 121 mg/dL — ABNORMAL HIGH (ref 70–99)
Glucose-Capillary: 150 mg/dL — ABNORMAL HIGH (ref 70–99)
Glucose-Capillary: 198 mg/dL — ABNORMAL HIGH (ref 70–99)

## 2022-03-13 LAB — MAGNESIUM: Magnesium: 2 mg/dL (ref 1.7–2.4)

## 2022-03-13 LAB — PHOSPHORUS: Phosphorus: 3 mg/dL (ref 2.5–4.6)

## 2022-03-13 NOTE — Progress Notes (Addendum)
Inpatient Rehab Admissions: ? ?Inpatient Rehab Consult received.  I met with patient at the bedside for rehabilitation assessment and to discuss goals and expectations of an inpatient rehab admission.  Pt acknowledged understanding of CIR goals and expectations. Pt is adamant about going home with [facility] and not pursuing CIR. TOC made aware. ? ?ADDENDUM 1321: Asked to go back to speak with pt again as pt's wife feels as if CIR would be best for pt. Met with pt and wife this time. Wife acknowledged understanding of CIR goals and expectations. Wife would like pt to pursue CIR. Pt now in agreement with pursuing CIR. Wife confirmed that she will be able to provide 24/7 support for pt after discharge. Will continue to follow. ? ?Signed: ?Gayland Curry, MS, CCC-SLP ?Admissions Coordinator ?[phone] ? ? ?

## 2022-03-13 NOTE — TOC Progression Note (Signed)
Transition of Care (TOC) - Progression Note  ? ? ?Patient Details  ?Name: Kevin [name] ?MRN: [id number] ?Date of Birth: [birth year] ? ?Transition of Care (TOC) CM/SW Contact  ?Bartholomew Crews, RN ?Phone Number: [phone] ?03/13/2022, 11:30 AM ? ?Clinical Narrative:    ? ?Spoke with patient on the hospital room phone to discuss his desire to go home with home health. Demographics verified. Patient stated that his cell phone number is [phone], but that the other number is wife's, [name]. Verbal consent received to talk to [name] about discharge planning. Spoke with [name] on her mobile phone. [name] stated that patient has dementia and all he talks about is going home,but she stated that he needs rehab first because he is not paying attention as usual. CIR liaison aware.  ? ?Expected Discharge Plan: [place] ?Barriers to Discharge: Continued Medical Work up ? ?Expected Discharge Plan and Services ?Expected Discharge Plan: [place] ?  ?  ?Post Acute Care Choice: IP Rehab ?  ?                ?  ?  ?  ?  ?  ?  ?  ?  ?  ?  ? ? ?Social Determinants of Health (SDOH) Interventions ?  ? ?Readmission Risk Interventions ? ?  01/11/2022  ?  1:27 PM  ?Readmission Risk Prevention Plan  ?Transportation Screening Complete  ?PCP or Specialist Appt within 3-5 Days Complete  ?[place] or Home Care Consult Complete  ?Social Work Consult for [place] Planning/Counseling Complete  ?Palliative Care Screening Not Applicable  ?Medication Review Press photographer) Complete  ? ? ?

## 2022-03-13 NOTE — Progress Notes (Signed)
?PROGRESS NOTE ? ? ? ?Kevin [name]  XIP:[id number] DOB: 1946/04/20 DOA: 03/09/2022 ?PCP: [name], MD  ? 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  Patient states that he tripped on his trash can and the wife states that he usually uses a walker but did not do so at the time when he fell.  He did not hit his head and did not lose consciousness but after his fall he had pain in the left shoulder and left hip is brought to the ED.  X-rays in the ED showed a left hip fracture and a left proximal humerus fracture.  Orthopedic surgery was consulted and the plan is for surgical intervention of his left hip and this was done by [name] on 03/10/22 and he underwent left proximal humerus fractures s/p ORIF by [name] on 03/11/22.  PT OT evaluated and recommending CIR and he was screened and appears to be a potential candidate for CIR.  Per orthopedic surgery [name] he is to be weightbearing as tolerated left lower extremity with posterior hip precautions for 6 weeks and recommending Plavix and Lovenox for 4 weeks after surgery however given his acute drop from hemoglobin his Lovenox was held.  Given that his blood count dropped a little bit orthopedic surgery also type and screen and transfuse 1 unit of PRBCs.  Per orthopedic surgery he has to be nonweightbearing in the left upper extremity but can use a platform walker to assist with mobilization and have passive shoulder abduction but no active shoulder abduction with a sling for comfort. ? ?His Blood Count is improved and Hgb/Hct is now 9.4/28.9. The rest of his labs are stable. Patient  wanting to go home but remains a little confused due to his dementia. Will pursue CIR workup at [facility].   ? ? ?Assessment and Plan: ? ?Left hip fracture and left proximal humerus fracture after mechanical fall  ?-Orthopedic surgery was consulted and appreciate their evaluation and management; plan is for surgical intervention of his left hip yesterday for hemiarthroplasty with him going back to the OR today for his left shoulder. ?-Continue with morphine 0.5 mg IV every 2 as needed for severe pain and Ondansetron 4 mg IV once ?-Patient had a nerve block ?-Further care per orthopedic surgery Teams  ?-Surgery recommending Lovenox starting postoperative day 1 for 4 weeks but this is held given that he underwent surgery with [name] and had an ORIF for his left proximal humerus fracture.  Given his acute blood loss anemia Lovenox has been held by the orthopedic surgery team ?-Orthopedic surgery recommending weightbearing as tolerated with posterior hip precautions for 6 weeks for his leg ?-Orthopedic surgery is also recommending nonweightbearing in left upper extremity using a platform walker to assist with mobilization and use shoulder pendulums and passive and active shoulder flexion and extension with passive shoulder abduction but no active shoulder abduction.  He has unrestricted range of motion of the left elbow forearm and wrist and hand and a sling for comfort. ?-Per orthopedic surgery dressing changes to start tomorrow to the left shoulder ?-PT OT recommending inpatient rehabilitation and he appears to be a potential CIR candidate; CIR working  the patient up  ?  ?Chronic Combined Systolic and Diastolic CHF status post ICD placement  ?-Appears compensated.  Patient is not on any diuretics.   ?-Continue to monitor for S/Sx of Volume Overload ?-Patient is +929 mL currently  ?  ?Diabetes Mellitus Type 2 ?-Initiated on Sensitive NovoLog Sliding Scale every 4h ?-CBGs ranging from 121-228 ?-Continue to  Monitor and Trend Blood Sugars carefully  ?  ?Chronic Kidney Disease Stage IIIa  ?-Creatinine usually is around 1.1-1.3. ?-Patient's BUNs/creatinine went from 20/1.40 -> 17/1.34 -> 25/1.40 -> 35/1.54 -> 31/1.48 (GFR 49) ?-Avoid further nephrotoxic medications, contrast dyes, hypotension and dehydration to ensure adequate renal perfusion and will need to renally adjust medications ?-Repeat CMP in the a.m. ?  ?Hypertension  ?-Presently not on any medication.  Follow closely.   ?-Will place on As needed IV hydralazine for systolic blood pressure more than 160. ?-Continue to Monitor BP per Protocol ?-Last BP was 108/69 ?  ?Leukocytosis, slightly worsening ?-Patient's WBC went from 10.2 -> 11.9 -> 13.7 -> 9.0 -> 7.7 ?-Likely reactive in the setting of his fall and now Surgical Intervention ?-U/A done and showed clear appearance with yellow color urine, Small Leukocytes, Negative Nitrites, many bacteria, present mucus, 0-5 RBCs per high-power field, 21-50 WBCs ?-Continue to monitor for signs and symptoms of infection; no overt infection noted ?-Repeat CBC in a.m. ?  ?History of Prior Stroke ?-He is on Plavix and this was held but now is okay to resume per orthopedic recommendation is now back on 75 mg p.o. daily ?  ?Hyperkalemia ?-Patient's K+ went from 4.4 -> 5.3 -> 4.3 -> 3.8 ?-Given 10 grams of Lokelma x1 with improvement  ?-Continue to Monitor and Trend ?-Repeat CMP in the AM  ?  ?Macrocytic Anemia with superimposed Acute Blood Loss Anemia from Post-Operative Cause ?-Patient's Hgb/Hct went from 12.2/36.0 -> 11.0/33.9 -> 9.9/30.6  -> 7.8/24.8 and now improved to  9.4/28.9 with an MCV of 101.4 ?-Checked Anemia Panel and showed an iron level of 20, U IBC of 207, TIBC of 227, saturation ratio 9%, ferritin level of 280, folate level 70.6, and a vitamin B12 level of 277 ?-Given his history of CHF and acute blood loss orthopedic surgery gave the patient one unit of pRBC ?-Continue to Monitor for S/Sx of Bleeding; No overt  bleeding noted ?-Repeat CBC in the AM  ?  ?Thrombocytopenia ?-Patient's Platelet Count went from 179 -> 156 -> 142 -> 110 -> 116 ?-Continue to Monitor for S/Sx of Bleeding; No overt Bleeding noted ?-Repeat CBC in the AM  ?  ?Hypoalbuminemia ?-Patient's Albumin Level was 3.2 -> 3.3 -> 3.1 ?-Continue to Monitor and Trend ?-Repeat CMP in the AM  ?  ?Dementia  ?-No acute issues at this time.  Resume home medications after surgery when patient can take p.o. and have now resumed his donepezil 5 mg p.o. nightly ?-He was resumed on his home Mirtazapine 15 mg p.o. nightly ? ?DVT prophylaxis: SCDs Start: 03/12/22 0612 ?SCD's Start: 03/12/22 0612 ?SCDs Start: 03/09/22 2302 ? ?  Code Status: DNR ?Family Communication: No family present at bedside  ? ?Disposition Plan:  ?Level of care: Telemetry Medical ?Status is: Inpatient ?Remains inpatient appropriate because: Needs CIR and Insurance Authorization  ?  ?Consultants:  ?Orthopedic Surgery [name]  ?Orthopedic Surgery [name] ?CIR ? ?Procedures:  ?ARTHROPLASTY BIPOLAR HIP (HEMIARTHROPLASTY) ?L proximal humerus fracture s/p ORIF  ? ?Antimicrobials:  ?Anti-infectives (From admission, onward)  ? ? Start     Dose/Rate Route  Frequency Ordered Stop  ? 03/12/22 0613  ceFAZolin (ANCEF) IVPB 1 g/50 mL premix  Status:  Discontinued       ? 1 g ?100 mL/hr over 30 Minutes Intravenous Every 6 hours 03/12/22 0613 03/12/22 0616  ? 03/11/22 2100  ceFAZolin (ANCEF) IVPB 2g/100 mL premix       ? 2 g ?200 mL/hr over 30 Minutes Intravenous Every 8 hours 03/11/22 1646 03/12/22 0512  ? 03/11/22 1053  ceFAZolin (ANCEF) 2-4 GM/100ML-% IVPB       ?Note to Pharmacy: Cameron Sprang M: cabinet override  ?    03/11/22 1053 03/11/22 2259  ? 03/11/22 0600  ceFAZolin (ANCEF) IVPB 2g/100 mL premix       ? 2 g ?200 mL/hr over 30 Minutes Intravenous On call to O.R. 03/10/22 1609 03/10/22 1745  ? ?  ?  ?Subjective: ?Seen and examined at bedside he is slightly confused and pleasantly demented.  Is  wanting to go home.  Denies any nausea or vomiting.  Feels okay.  States his pain is controlled currently.  No other concerns or complaints at this time. ? ?Objective: ?Vitals:  ? 03/12/22 1352 03/12/22 2030 05/06

## 2022-03-13 NOTE — PMR Pre-admission (Signed)
PMR Admission Coordinator Pre-Admission Assessment ? ?Patient: Kevin [name] is an 76 y.o., male ?MRN: [id number] ?DOB: 19-Oct-1946 ?Height: '6\' 4"'$  (193 cm) ?Weight: 68 kg ? ?Insurance Information ?HMO:     PPO:      PCP:      IPA:      80/20:      OTHER:  ?PRIMARY: [facility]      ?Policy#: [id number]      Subscriber: patient ?CM Name: Sherlynn Carbon       Phone#: [phone]     Fax#: [phone] ?Pre-Cert#:       Employer: Sherlynn Carbon called with approval 03/15/22 ?Benefits:  Phone #: [phone]     Name:  ?Eff. Date: 04/02/18-still active     Deduct: $0      Out of Pocket Max: $0      Life Max: 100% coverage ?CIR: 100% coverage      SNF: 100% coverage ?Outpatient: 100% coverage     Co-Pay:  ?Home Health: 100% coverage      Co-Pay:  ?DME: 100% coverage     Co-Pay:  ?Providers: in-network ?SECONDARY:  Bernadene Person     Policy#: [id number]     Phone#:  ? ?Financial Counselor:       Phone#:  ? ?The ?Data Collection Information Summary? for patients in Inpatient Rehabilitation Facilities with attached ?Privacy Act [place] Records? was provided and verbally reviewed with: N/A ? ?Emergency Contact Information ?Contact Information   ? ? Name Relation Home Work Mobile  ? Pavao,[name] Spouse   [phone]  ? Cox,Cheryl Relative   [phone]  ? ?  ? ? 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 Pt [facility] to [facility] after a fall without his walker. Pt hit head but denied any LOC. Pt was c/o left shoulder and left hip pain. X-ray of left hip showed closed a proximal left surgical neck fx. X-ray of left shoulder showed a closed proximal left humeral fx. Pt  underwent left hip hemiarthroplasty on 03/10/22. Pt underwent ORIF of left proximal humerus fx 03/11/22. Therapy evaluations completed and CIR recommended d/t pt's deficits in functional mobility and inability to complete ADLs independently.  ?  ? ?Patient's medical record from [facility] has been reviewed by the rehabilitation admission coordinator and physician. ? ?Past Medical History  ?Past Medical History:  ?Diagnosis Date  ? Agent orange exposure   ? in [place]  ? Anemia   ? Dementia ([place])   ? DNI (do not intubate) 01/21/2022  ? DNR (do not resuscitate) 01/21/2022  ? Heart block AV second degree   ? a. s/p PPM implant with subsequent CRTD upgrade  ? High cholesterol   ? History of colon polyps   ? LBBB (left bundle branch block)   ? Nonischemic cardiomyopathy ([place])   ? a. MDT CRTD upgrade 2016  ? Pacemaker 08/27/2013  ? Dual-chamber Medtronic Adapta implanted January 2013 for bradycardia with alternating bundle branch block and 2:1 atrioventricular block   ? Prostate cancer ([place])   ? "not been treated yet; on a wait and see" (01/22/2015)  ? Type II diabetes mellitus ([place])   ? ? ?Has the patient had major surgery  during 100 days prior to admission? Yes ? ?Family History   ?family history includes Cancer in his mother; Heart disease in his father; Leukemia in his father; Ovarian cancer in his mother. ? ?Current Medications ? ?Current Facility-Administered Medications:  ?  acetaminophen (TYLENOL) tablet 325-650 mg, 325-650 mg, Oral, Q6H PRN, Ainsley Spinner, PA-C ?  atorvastatin (LIPITOR) tablet 80 mg, 80 mg, Oral, Daily, Raiford Noble [place], [place], 80 mg at 03/15/22 7371 ?  buPROPion (WELLBUTRIN XL) 24 hr tablet 150 mg, 150 mg, Oral, Daily, Raiford Noble [place], DO, 150 mg at 03/15/22 0626 ?  Chlorhexidine Gluconate Cloth 2 % PADS 6 each, 6 each, Topical, Daily, Ainsley Spinner, PA-C, 6 each at 03/15/22 9485 ?  clopidogrel (PLAVIX) tablet 75 mg, 75 mg, Oral, Daily, Pham, Minh Q, RPH-CPP, 75 mg at 03/15/22 0903 ?   docusate sodium (COLACE) capsule 100 mg, 100 mg, Oral, BID, Ainsley Spinner, PA-C, 100 mg at 03/15/22 4627 ?  donepezil (ARICEPT) tablet 5 mg, 5 mg, Oral, QHS, Sheikh, Omair Klemme, DO, 5 mg at 03/14/22 2029 ?  enoxaparin (LOVENOX) injection 40 mg, 40 mg, Subcutaneous, Q24H, Chadwell, Joshua, PA-C, 40 mg at 03/15/22 0350 ?  feeding supplement (ENSURE ENLIVE / ENSURE PLUS) liquid 237 mL, 237 mL, Oral, BID BM, Ainsley Spinner, PA-C, 237 mL at 03/15/22 0908 ?  fentaNYL (SUBLIMAZE) injection 50 mcg, 50 mcg, Intravenous, Once, Marchwiany, Virgina [place], MD ?  HYDROcodone-acetaminophen (NORCO/VICODIN) 5-325 MG per tablet 1-2 tablet, 1-2 tablet, Oral, Q6H PRN, Ainsley Spinner, PA-C, 2 tablet at 03/14/22 2029 ?  insulin aspart (novoLOG) injection 0-9 Units, 0-9 Units, Subcutaneous, Q4H, Willaim Sheng, MD, 1 Units at 03/15/22 1137 ?  memantine (NAMENDA) tablet 10 mg, 10 mg, Oral, BID, Raiford Noble [place], DO, 10 mg at 03/15/22 0938 ?  metoCLOPramide (REGLAN) tablet 5-10 mg, 5-10 mg, Oral, Q8H PRN **OR** metoCLOPramide (REGLAN) injection 5-10 mg, 5-10 mg, Intravenous, Q8H PRN, Ainsley Spinner, PA-C ?  mirtazapine (REMERON SOL-TAB) disintegrating tablet 15 mg, 15 mg, Oral, QHS, Willaim Sheng, MD, 15 mg at 03/14/22 2028 ?  morphine (PF) 2 MG/ML injection 0.5 mg, 0.5 mg, Intravenous, Q2H PRN, Willaim Sheng, MD, 0.5 mg at 03/12/22 0436 ?  ondansetron (ZOFRAN) injection 4 mg, 4 mg, Intravenous, Once, Marchwiany, Virgina Norfolk, MD ?  ondansetron (ZOFRAN) tablet 4 mg, 4 mg, Oral, Q6H PRN **OR** ondansetron (ZOFRAN) injection 4 mg, 4 mg, Intravenous, Q6H PRN, Ainsley Spinner, PA-C ?  polyethylene glycol (MIRALAX / GLYCOLAX) packet 17 g, 17 g, Oral, BID, Raiford Noble [place], DO, 17 g at 03/15/22 1829 ?  senna-docusate (Senokot-S) tablet 1 tablet, 1 tablet, Oral, BID, Raiford Noble [place], [place], 1 tablet at 03/15/22 9371 ?  sodium zirconium cyclosilicate (LOKELMA) packet 10 g, 10 g, Oral, Once, Ainsley Spinner, PA-C ? ?Patients Current Diet:  ?Diet  Order   ? ?       ?  Diet Carb Modified Fluid consistency: Thin; Room service appropriate? Yes  Diet effective now       ?  ? ?  ?  ? ?  ? ? ?Precautions / Restrictions ?Precautions ?Precautions: Posterior Hip, Shoulder, Fall ?Precaution Booklet Issued: Yes (comment) ?Precaution Comments: NWB but OK for platform RW, AROM/PROM for shoulder extension/flexion, PROM shoulder abduction (NO ACTIVE ABDUCTION), pendulums OK ?Restrictions ?Weight Bearing Restrictions: Yes ?LUE Weight Bearing: Non weight bearing ?LLE Weight Bearing: Weight bearing as tolerated ?Other Position/Activity Restrictions: posterior hip precautions  ? ?Has the patient had 2 or more falls or a fall with  injury in the past year? Yes ? ?Prior Activity Level ?Limited Community (1-2x/wk): gets out of house 2x/week ? ?Prior Functional Level ?Self Care: Did the patient need help bathing, dressing, using the toilet or eating? Needed some help ? ?Indoor Mobility: Did the patient need assistance with walking from room to room (with or without device)? Independent ? ?Stairs: Did the patient need assistance with internal or external stairs (with or without device)? Dependent. Pt is not supposed to utilize stairs per wife report.  ? ?Functional Cognition: Did the patient need help planning regular tasks such as shopping or remembering to take medications? Needed some help ? ?Patient Information ?  ? ?Patient's Response To:  ?  ? ?Home Assistive Devices / Equipment ?Home Assistive Devices/Equipment: CBG Meter, Dentures (specify type), Walker (specify type), Bedside commode/3-in-1 ?Home Equipment: Conservation officer, nature (2 wheels), Shower seat, Grab bars - tub/shower, Rollator (4 wheels), Hand held shower head ? ?Prior Device Use: Indicate devices/aids used by the patient prior to current illness, exacerbation or injury? Walker ? ?Current Functional Level ?Cognition ? Overall Cognitive Status: History of cognitive impairments - at baseline ?Orientation Level: Oriented  X4 ?General Comments: Poor recall of precautions and poor safety awareness. ?   ?Extremity Assessment ?(includes Sensation/Coordination) ? Upper Extremity Assessment: LUE deficits/detail ?LUE Deficits / Details: able to

## 2022-03-13 NOTE — Progress Notes (Addendum)
Subjective: ?2 Days Post-Op Procedure(s) (LRB): ?OPEN REDUCTION INTERNAL FIXATION (ORIF) SHOULDER FRACTURE (Left), Left hip hemi ?Patient reports pain as mild.  Hoping to go home with his wife today. ? ?Objective: ?Vital signs in last 24 hours: ?Temp:  [97.2 ?F (36.2 ?C)-98.3 ?F (36.8 ?C)] 98.1 ?F (36.7 ?C) (05/06 0741) ?Pulse Rate:  [76-105] 83 (05/06 0741) ?Resp:  [17-20] 18 (05/06 0741) ?BP: (108-144)/(63-82) 108/69 (05/06 0741) ?SpO2:  [95 %-98 %] 96 % (05/06 0741) ? ?Intake/Output from previous day: ?05/05 0701 - 05/06 0700 ?In: 892 [P.O.:600; Blood:292] ?Out: 1100 [Urine:1100] ?Intake/Output this shift: ?No intake/output data recorded. ? ?Recent Labs  ?  03/11/22 ?0230 03/12/22 ?1761 03/13/22 ?0306  ?HGB 9.9* 7.8* 9.4*  ? ?Recent Labs  ?  03/12/22 ?0314 03/13/22 ?0306  ?WBC 9.0 7.7  ?RBC 2.36*  2.37* 2.85*  ?HCT 24.8* 28.9*  ?PLT 110* 116*  ? ?Recent Labs  ?  03/12/22 ?0314 03/13/22 ?0306  ?NA 142 141  ?K 4.3 3.8  ?CL 108 110  ?CO2 26 26  ?BUN 35* 31*  ?CREATININE 1.54* 1.48*  ?GLUCOSE 140* 146*  ?CALCIUM 8.4* 8.3*  ? ?No results for input(s): LABPT, INR in the last 72 hours. ? ?Neurovascular intact ?Sensation intact distally ?Intact pulses distally ?Dorsiflexion/Plantar flexion intact ?Incision: dressing C/D/I ? ? ?Assessment/Plan: ?2 Days Post-Op Procedure(s) (LRB): ?OPEN REDUCTION INTERNAL FIXATION (ORIF) SHOULDER FRACTURE (Left) 3 days post op L hip hemi ? ?  ?76 y/o male s/p fall with L proximal humerus fracture and L femoral neck fracture  ?  ?-fall ?  ?- L proximal humerus fracture s/p ORIF  ?            NWB L UEx but can use platform walker to assist mobilization  ?            Shoulder pendulums ?            Passive and active shoulder flexion and extension  ?            Passive shoulder abduction, NO ACTIVE SHOULDER ABDUCTION  ?            Unrestricted ROM L elbow, forearm, wrist and hand  ?            Sling for comfort ?            Therapy evals ?            Dressing changes starting tomorrow to L  shoulder ?                        Mepilex or similar  ?  ?- L femoral neck fracture s/p left hip hemiarthroplasty by [name]  ?            WBAT LLE with posterior hip precautions x6 weeks ?  Aquacel dressing to be kept intact until follow-up ?DVT prophylaxis: Plavix + lovenox starting POD1 x4 weeks ? ?- Pain management: ?            Multimodal  ?            Minimize narcotics ?  ?- ABL anemia/Hemodynamics ?             hold lovenox until H/H stable  ?  ?- Medical issues  ?            Per primary  ?  ?- DVT/PE prophylaxis: ?  Home plavix ?            Hold lovenox until h/h stable ?            Scds ?  ?- ID:  ?            Periop abx ?  ?- Metabolic Bone Disease: ?            Fractures consistent with fragility fractures ?            Recommend dexa as outpt ?            Refer to osteoporosis clinic  ?            Vitamin d levels are good  ?  ?- Activity: ?            As above ?  ?- Impediments to fracture healing: ?            Fragility fractures ?            Elevated fall risk ?  ?- Dispo: ?            Ortho issues addressed  ?            SW consult for SNF ?            Follow up with [name] in 2 weeks  ? Follow up: 2 weeks after surgery for a wound check with [name] at [facility].  ? ?Chriss Czar ?03/13/2022, 9:41 AM ? ?

## 2022-03-14 DIAGNOSIS — S42212A Unspecified displaced fracture of surgical neck of left humerus, initial encounter for closed fracture: Secondary | ICD-10-CM | POA: Diagnosis not present

## 2022-03-14 DIAGNOSIS — S72042A Displaced fracture of base of neck of left femur, initial encounter for closed fracture: Secondary | ICD-10-CM | POA: Diagnosis not present

## 2022-03-14 DIAGNOSIS — I5042 Chronic combined systolic (congestive) and diastolic (congestive) heart failure: Secondary | ICD-10-CM | POA: Diagnosis not present

## 2022-03-14 DIAGNOSIS — S72001A Fracture of unspecified part of neck of right femur, initial encounter for closed fracture: Secondary | ICD-10-CM | POA: Diagnosis not present

## 2022-03-14 LAB — COMPREHENSIVE METABOLIC PANEL
ALT: 5 U/L (ref 0–44)
AST: 15 U/L (ref 15–41)
Albumin: 2.8 g/dL — ABNORMAL LOW (ref 3.5–5.0)
Alkaline Phosphatase: 59 U/L (ref 38–126)
Anion gap: 8 (ref 5–15)
BUN: 31 mg/dL — ABNORMAL HIGH (ref 8–23)
CO2: 24 mmol/L (ref 22–32)
Calcium: 8.6 mg/dL — ABNORMAL LOW (ref 8.9–10.3)
Chloride: 111 mmol/L (ref 98–111)
Creatinine, Ser: 1.4 mg/dL — ABNORMAL HIGH (ref 0.61–1.24)
GFR, Estimated: 52 mL/min — ABNORMAL LOW (ref >60)
Glucose, Bld: 127 mg/dL — ABNORMAL HIGH (ref 70–99)
Potassium: 4 mmol/L (ref 3.5–5.1)
Sodium: 143 mmol/L (ref 135–145)
Total Bilirubin: 1.1 mg/dL (ref 0.3–1.2)
Total Protein: 5.6 g/dL — ABNORMAL LOW (ref 6.5–8.1)

## 2022-03-14 LAB — CBC WITH DIFFERENTIAL/PLATELET
Abs Immature Granulocytes: 0.02 10*3/uL (ref 0.00–0.07)
Basophils Absolute: 0 10*3/uL (ref 0.0–0.1)
Basophils Relative: 0 %
Eosinophils Absolute: 0.2 10*3/uL (ref 0.0–0.5)
Eosinophils Relative: 2 %
HCT: 29.4 % — ABNORMAL LOW (ref 39.0–52.0)
Hemoglobin: 9.2 g/dL — ABNORMAL LOW (ref 13.0–17.0)
Immature Granulocytes: 0 %
Lymphocytes Relative: 18 %
Lymphs Abs: 1.3 10*3/uL (ref 0.7–4.0)
MCH: 33.1 pg (ref 26.0–34.0)
MCHC: 31.3 g/dL (ref 30.0–36.0)
MCV: 105.8 fL — ABNORMAL HIGH (ref 80.0–100.0)
Monocytes Absolute: 0.7 10*3/uL (ref 0.1–1.0)
Monocytes Relative: 9 %
Neutro Abs: 5.2 10*3/uL (ref 1.7–7.7)
Neutrophils Relative %: 71 %
Platelets: 149 10*3/uL — ABNORMAL LOW (ref 150–400)
RBC: 2.78 MIL/uL — ABNORMAL LOW (ref 4.22–5.81)
RDW: 14.4 % (ref 11.5–15.5)
WBC: 7.4 10*3/uL (ref 4.0–10.5)
nRBC: 0 % (ref 0.0–0.2)

## 2022-03-14 LAB — GLUCOSE, CAPILLARY
Glucose-Capillary: 135 mg/dL — ABNORMAL HIGH (ref 70–99)
Glucose-Capillary: 126 mg/dL — ABNORMAL HIGH (ref 70–99)
Glucose-Capillary: 199 mg/dL — ABNORMAL HIGH (ref 70–99)
Glucose-Capillary: 124 mg/dL — ABNORMAL HIGH (ref 70–99)
Glucose-Capillary: 173 mg/dL — ABNORMAL HIGH (ref 70–99)

## 2022-03-14 LAB — MAGNESIUM: Magnesium: 2.1 mg/dL (ref 1.7–2.4)

## 2022-03-14 LAB — PHOSPHORUS: Phosphorus: 3.7 mg/dL (ref 2.5–4.6)

## 2022-03-14 MED ORDER — POLYETHYLENE GLYCOL 3350 17 G PO PACK
17.0000 g | PACK | Freq: Two times a day (BID) | ORAL | Status: DC
  Administered 2022-03-14 – 2022-03-15 (×3): 17 g via ORAL
  Filled 2022-03-14 (×4): qty 1

## 2022-03-14 MED ORDER — SENNOSIDES-DOCUSATE SODIUM 8.6-50 MG PO TABS
1.0000 | ORAL_TABLET | Freq: Two times a day (BID) | ORAL | Status: DC
  Administered 2022-03-14 – 2022-03-16 (×4): 1 via ORAL
  Filled 2022-03-14 (×4): qty 1

## 2022-03-14 MED ORDER — ENOXAPARIN SODIUM 40 MG/0.4ML IJ SOSY
40.0000 mg | PREFILLED_SYRINGE | INTRAMUSCULAR | Status: DC
  Administered 2022-03-14 – 2022-03-16 (×3): 40 mg via SUBCUTANEOUS
  Filled 2022-03-14 (×3): qty 0.4

## 2022-03-14 NOTE — Progress Notes (Signed)
Subjective: ?3 Days Post-Op Procedure(s) (LRB): ?OPEN REDUCTION INTERNAL FIXATION (ORIF) SHOULDER FRACTURE (Left), Left hip hemi ?Patient reports pain as mild. Still wanting to go home. ? ?Objective: ?Vital signs in last 24 hours: ?Temp:  [98.1 ?F (36.7 ?C)-98.8 ?F (37.1 ?C)] 98.4 ?F (36.9 ?C) (05/07 5170) ?Pulse Rate:  [83-108] 92 (05/07 0629) ?Resp:  [18-20] 20 (05/06 2014) ?BP: (108-135)/(69-74) 134/74 (05/07 0174) ?SpO2:  [96 %-98 %] 98 % (05/07 0629) ? ?Intake/Output from previous day: ?05/06 0701 - 05/07 0700 ?In: -  ?Out: 1000 [Urine:1000] ?Intake/Output this shift: ?No intake/output data recorded. ? ?Recent Labs  ?  03/12/22 ?0314 03/13/22 ?0306 03/14/22 ?0158  ?HGB 7.8* 9.4* 9.2*  ? ?Recent Labs  ?  03/13/22 ?0306 03/14/22 ?0158  ?WBC 7.7 7.4  ?RBC 2.85* 2.78*  ?HCT 28.9* 29.4*  ?PLT 116* 149*  ? ?Recent Labs  ?  03/13/22 ?0306 03/14/22 ?0158  ?NA 141 143  ?K 3.8 4.0  ?CL 110 111  ?CO2 26 24  ?BUN 31* 31*  ?CREATININE 1.48* 1.40*  ?GLUCOSE 146* 127*  ?CALCIUM 8.3* 8.6*  ? ?No results for input(s): LABPT, INR in the last 72 hours. ? ?Pt sitting up in chair this AM.  Shoulder dsg already changed with mepilex.  LUE/LLE NVI.   ? ? ?Assessment/Plan: ?3 Days Post-Op Procedure(s) (LRB): ?OPEN REDUCTION INTERNAL FIXATION (ORIF) SHOULDER FRACTURE (Left) 4 days post op L hip hemi ? ?76 y/o male s/p fall with L proximal humerus fracture and L femoral neck fracture  ?  ?-fall ?  ?- L proximal humerus fracture s/p ORIF  ?            NWB L UEx but can use platform walker to assist mobilization  ?            Shoulder pendulums ?            Passive and active shoulder flexion and extension  ?            Passive shoulder abduction, NO ACTIVE SHOULDER ABDUCTION  ?            Unrestricted ROM L elbow, forearm, wrist and hand  ?            Sling for comfort ?            Therapy evals ?            Dressing changes starting tomorrow to L shoulder ?                        Mepilex or similar  ?  ?- L femoral neck fracture s/p left  hip hemiarthroplasty by [name]  ?            WBAT LLE with posterior hip precautions x6 weeks ?             Aquacel dressing to be kept intact until follow-up ?DVT prophylaxis: Plavix + lovenox starting POD1 x4 weeks ?  ?- Pain management: ?            Multimodal  ?            Minimize narcotics ?  ?- ABL anemia/Hemodynamics ?             H/H stable today ? ?Medicine is it ok to start lovenox?  ?  ?- Medical issues  ?            Per primary  ?  ?-  DVT/PE prophylaxis: ?            Home plavix ?            Hold lovenox until h/h stable ?            Scds ?  ?- ID:  ?            Periop abx ?  ?- Metabolic Bone Disease: ?            Fractures consistent with fragility fractures ?            Recommend dexa as outpt ?            Refer to osteoporosis clinic  ?            Vitamin d levels are good  ?  ?- Activity: ?            As above ?  ?- Impediments to fracture healing: ?            Fragility fractures ?            Elevated fall risk ?  ?- Dispo: ?            Ortho issues addressed  ?            SW consult for SNF ?            Follow up with [name] in 2 weeks  ?            Follow up: 2 weeks after surgery for a wound check with [name] at [facility].  ? ? ?Chriss Czar ?03/14/2022, 7:40 AM ? ?

## 2022-03-14 NOTE — Progress Notes (Signed)
?PROGRESS NOTE ? ? ? ?Kevin SWIDERSKI  DXI:[id number] DOB: [birth year] DOA: 03/09/2022 ?PCP: [name], MD  ? 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  Patient states that he tripped on his trash can and the wife states that he usually uses a walker but did not do so at the time when he fell.  He did not hit his head and did not lose consciousness but after his fall he had pain in the left shoulder and left hip is brought to the ED.  X-rays in the ED showed a left hip fracture and a left proximal humerus fracture.  Orthopedic surgery was consulted and the plan is for surgical intervention of his left hip and this was done by [name] on 03/10/22 and he underwent left proximal humerus fractures s/p ORIF by [name] on 03/11/22.  PT OT evaluated and recommending CIR and he was screened and appears to be a potential candidate for CIR.  Per orthopedic surgery [name] he is to be weightbearing as tolerated left lower extremity with posterior hip precautions for 6 weeks and recommending Plavix and Lovenox for 4 weeks after surgery however given his acute drop from hemoglobin his Lovenox was held.  Given that his blood count dropped a little bit orthopedic surgery also type and screen and transfuse 1 unit of PRBCs.  Per orthopedic surgery he has to be nonweightbearing in the left upper extremity but can use a platform walker to assist with mobilization and have passive shoulder abduction but no active shoulder abduction with a sling for comfort. ? ?His Blood Count is improved and Hgb/Hct is now 9.4/28.9 yesterday and today is 9.2/29.4 so Ortho  is resuming Lovenox today at 40 mg q24h. The rest of his labs are stable. Patient wanting to go home but remains a little confused due to his dementia. Will pursue CIR workup at [facility].  ? ?He is now having issues with Acute Urinary Retention. Will I and O x3 for >300 mL and if he continues to retain will place foley catheter.   ? ?Assessment and Plan: ? ?Left hip fracture and left proximal humerus fracture after mechanical fall  ?-Orthopedic surgery was consulted and appreciate their evaluation and management; plan is for surgical intervention of his left hip for hemiarthroplasty and ORIF of Shoulder  ?-Continue with morphine 0.5 mg IV every 2 as needed for severe pain and Ondansetron 4 mg IV once ?-Patient had a nerve block ?-Further care per orthopedic surgery Teams  ?-Surgery recommending Lovenox starting postoperative day 1 for 4 weeks but this is held given that he underwent surgery with [name] and had an ORIF for his left proximal humerus fracture.  Given his acute blood loss anemia Lovenox had been held by the orthopedic surgery team but now that his Hgb/Hct is stable it is being resumed today (03/14/22) ?-Orthopedic surgery recommending weightbearing as tolerated with posterior hip precautions for 6 weeks for his leg ?-Orthopedic surgery is also recommending nonweightbearing in left upper extremity using a platform walker to assist with mobilization and use shoulder pendulums and passive and active shoulder flexion and extension with passive shoulder abduction but no active shoulder abduction.  He has unrestricted range of motion of the left elbow forearm and wrist and hand and a sling for comfort. ?-Per orthopedic surgery dressing changes to start tomorrow to the left shoulder ?-PT OT recommending inpatient rehabilitation and he appears to be a potential CIR candidate; CIR working the patient up  ?  ?Chronic Combined Systolic and Diastolic CHF status post ICD placement  ?-Appears compensated.   Patient is not on any diuretics.   ?-Continue to monitor for S/Sx of Volume Overload ?-Patient is -920 mL currently  ?  ?Diabetes Mellitus Type 2 ?-Initiated on Sensitive NovoLog Sliding Scale every 4h ?-CBGs ranging from 126-198 ?-Continue to Monitor and Trend Blood Sugars carefully  ?  ?Chronic Kidney Disease Stage IIIa  ?-Creatinine usually is around 1.1-1.3. ?-Patient's BUNs/creatinine went from 20/1.40 -> 17/1.34 -> 25/1.40 -> 35/1.54 -> 31/1.48 -> 31/1.40 (GFR 52) ?-Avoid further nephrotoxic medications, contrast dyes, hypotension and dehydration to ensure adequate renal perfusion and will need to renally adjust medications ?-Repeat CMP in the a.m. ?  ?Hypertension  ?-Presently not on any medication.  Follow closely.   ?-Will place on As needed IV hydralazine for systolic blood pressure more than 160. ?-Continue to Monitor BP per Protocol ?-Last BP was 130/72 ?  ?Leukocytosis improving  ?-Patient's WBC went from 10.2 -> 11.9 -> 13.7 -> 9.0 -> 7.7 -> 7.4 ?-Likely reactive in the setting of his fall and now Surgical Intervention ?-U/A done and showed clear appearance with yellow color urine, Small Leukocytes, Negative Nitrites, many bacteria, present mucus, 0-5 RBCs per high-power field, 21-50 WBCs ?-Continue to monitor for signs and symptoms of infection; no overt infection noted ?-Repeat CBC in a.m. ?  ?History of Prior Stroke ?-He is on Plavix and this was held but now is okay to resume per orthopedic recommendation is now back on 75 mg p.o. daily ?-Back on his Atorvastatin as well with 80 mg Daily  ?  ?Hyperkalemia, resolved and improved ?-Patient's K+ went from 4.4 -> 5.3 -> 4.3 -> 3.8 -> 4.0 ?-Given 10 grams of Lokelma x1 with improvement  ?-Continue to Monitor and Trend ?-Repeat CMP in the AM  ?  ?Macrocytic Anemia with superimposed Acute Blood Loss Anemia from Post-Operative Cause ?-Patient's Hgb/Hct went from 12.2/36.0 -> 11.0/33.9 -> 9.9/30.6  -> 7.8/24.8 and now improved to  9.4/28.9 yesterday and now  is stable at 9.2/29.4 with an MCV of 105.8 ?-Checked Anemia Panel and showed an iron level of 20, U IBC of 207, TIBC of 227, saturation ratio 9%, ferritin level of 280, folate level 70.6, and a vitamin B12 level of 277 ?-Given his history of CHF and acute blood loss orthopedic surgery gave the patient one unit of pRBC ?-Continue to Monitor for S/Sx of Bleeding; No overt bleeding noted ?-Repeat CBC in the AM  ? ?Acute Urinary Retention ?-Likely due to Inability to Ambulate properly ?-I and O x3 for >300 mL in Bladder; Already has it done twice. If unable to urinate again and retaining will place foley ?-Will start Tamsulosin 0.4 mg po Daily but he has an allergy to Sulfa Abx so will need to verify side effect ?  ?Thrombocytopenia, improving  ?-Patient's Platelet Count went from 179 -> 156 -> 142 -> 110 -> 116 -> 149  ?-Lovenox had been held but now being resumed by Orthopedic Surgery ?-Continue to Monitor for S/Sx of Bleeding; No overt Bleeding noted ?-Repeat CBC in the AM  ?  ?Hypoalbuminemia ?-Patient's Albumin Level was 3.2 -> 3.3 -> 3.1 ->  2.8 ?-Continue to Monitor and Trend ?-Repeat CMP in the AM  ?  ?Dementia  ?-No acute issues at this time.  Resume home medications after surgery when patient can take p.o. and have now resumed his donepezil 5 mg p.o. nightly ?-He was resumed on his home Mirtazapine 15 mg p.o. nightly ? ?DVT prophylaxis: enoxaparin (LOVENOX) injection 40 mg Start: 03/14/22 0945 ?SCDs Start: 03/12/22 0612 ?SCD's Start: 03/12/22 0612 ?SCDs Start: 03/09/22 2302 ? ?  Code Status: DNR ?Family Communication: No family present at bedside  ? ?Disposition Plan:  ?Level of care: Telemetry Medical ?Status is: Inpatient ?Remains inpatient appropriate because: Needs CIR and Insurance Authorization  ?  ?Consultants:  ?Orthopedic Surgery [name]  ?Orthopedic Surgery [name] ?CIR ? ?Procedures:  ?ARTHROPLASTY BIPOLAR HIP (HEMIARTHROPLASTY) ?L proximal humerus fracture s/p ORIF  ? ?Antimicrobials:   ?Anti-infectives (From admission, onward)  ? ? Start     Dose/Rate Route Frequency Ordered Stop  ? 03/12/22 0613  ceFAZolin (ANCEF) IVPB 1 g/50 mL premix  Status:  Discontinued       ? 1 g ?100 mL/hr over 30

## 2022-03-14 NOTE — Op Note (Signed)
03/11/2022 ? ?10:28 AM ? ?PATIENT:  Kevin [name]  76 y.o. male ? ?PRE-OPERATIVE DIAGNOSIS:  LEFT HUMERUS NECK FRACTURE ? ?POST-OPERATIVE DIAGNOSIS:  LEFT 3 PART HUMERUS FRACTURE ? ?PROCEDURE:  Procedure(s): ?OPEN REDUCTION INTERNAL FIXATION (ORIF) LEFT PROXIMAL HUMERUS FRACTURE (Left) ? ?SURGEON:  Surgeon(s) and Role: ?   Altamese Flint Hill, MD - Primary ? ?PHYSICIAN ASSISTANT: Ainsley Spinner, PA-C ? ?ANESTHESIA:   regional and general ? ?I/O:  Total I/O ?In: -  ?Out: 525 [Urine:525] ? ?SPECIMEN:  No Specimen ? ?TOURNIQUET:  * No tourniquets in log * ? ?COMPLICATIONS: NONE ? ?DICTATION: .Note written in EPIC ? ?DISPOSITION: TO PACU ? ?CONDITION: STABLE ? ?DELAY START OF DVT PROPHYLAXIS BECAUSE OF BLEEDING RISK: NO ? ?BRIEF SUMMARY OF INDICATION FOR PROCEDURE:  Kevin [name] is a very ?pleasant 76 y.o. right-hand dominant malae with dementia who sustained a left proximal humerus fracture and a displaced femoral neck fracture. In order to most reliably restore function and reduce pain I recommended surgical repair. We also arranged for [name] to proceed with immediate hemiarthroplasty on the day of injury. I discussed risks of heart attack, stroke, infection, malunion, nonunion, loss of motion, DVT/ PE, loss of reduction, avascular necrosis, screw penetration, and need for further surgery, among others with the patient's daughter. Consent was provided to ?proceed. ? ?BRIEF SUMMARY OF PROCEDURE:  After preoperative antibiotics, the patient was taken to the operating room ?where general anesthesia was induced. The standard chlorhexidene wash then betadine scrub and paint, then draping performed. The deltopectoral approach was made ?after time-out.  Dissection was carried down to the interval where the superior lateral edge of the pec insertion was mobilized as well as ?anterior edge of the deltoid.  I was able to mobilize the head segment and identify the surgical neck fracture as well as extension into the  greater tuberosity. Hematoma was evacuated with curettes and lavage. Using the long head of the biceps and the bicipital groove as landmarks, reduction maneuvers were performed while keeping the periostial attachments to ?all the fragments.  Pins were used provisionally followed by the plate with additional pins for further ?fixation.  C-arm confirmed appropriate alignment and ?reduction with goals for restoration of proper head ?shaft orientation and tuberosity position.  This was followed by a screw fixation into the shaft ?in the slotted hole and then standard fixation into the head, which ?worked to appose the plate to the bone, although it was not completely ?flush in all areas. The reduction of the humeral head and shaft was ?excellent and so we proceeded with additional fixation. ?Locked pegs were placed into the head and primarily standard screws into the shaft.  Final images showed appropriate ?reduction, hardware trajectory, and length. Outstanding clinical motion was feasible on the table, including abduction, and internal and external rotation. My assistant, Ainsley Spinner, ?was present and assisting throughout.  An assistant was absolutely necessary for safe and effective ?completion as the reduction had to be held during provisional and definitive fixation. ?The wound was thoroughly irrigated and then closed in a standard layered fashion using #1 Vicryl ?to reapproximate the superior edge of the pec and anterior edge of the ?delt and then 0 for reapproximation of the muscular interval, 2-0 Vicryl ?and nylon for the skin.  Sterile gently compressive dressing was applied ?and a sling with an Ace from hand to upper arm.  The patient was taken ?to PACU in stable condition. ? ?PROGNOSIS:  KEZIAH DROTAR will have unrestricted gentle passive  range of motion of ?the operative shoulder at this time with active elbow, wrist, and digital ?motion.  We can begin weightbearing with a platform walker now, removing  at 6 weeks.  I concerned about the fall risk as is the family, particularly with combined hip and shoulder inj. Intensive PT/ OT will be critical to recovery. ? ? ? ? ?Astrid Divine. Marcelino Scot, M.D. ?  ?

## 2022-03-15 ENCOUNTER — Encounter (HOSPITAL_COMMUNITY): Payer: Self-pay | Admitting: Orthopedic Surgery

## 2022-03-15 DIAGNOSIS — I5042 Chronic combined systolic (congestive) and diastolic (congestive) heart failure: Secondary | ICD-10-CM | POA: Diagnosis not present

## 2022-03-15 DIAGNOSIS — S42212A Unspecified displaced fracture of surgical neck of left humerus, initial encounter for closed fracture: Secondary | ICD-10-CM | POA: Diagnosis not present

## 2022-03-15 DIAGNOSIS — S72042A Displaced fracture of base of neck of left femur, initial encounter for closed fracture: Secondary | ICD-10-CM | POA: Diagnosis not present

## 2022-03-15 DIAGNOSIS — S72001A Fracture of unspecified part of neck of right femur, initial encounter for closed fracture: Secondary | ICD-10-CM | POA: Diagnosis not present

## 2022-03-15 LAB — CBC WITH DIFFERENTIAL/PLATELET
Abs Immature Granulocytes: 0.02 10*3/uL (ref 0.00–0.07)
Basophils Absolute: 0 10*3/uL (ref 0.0–0.1)
Basophils Relative: 0 %
Eosinophils Absolute: 0.2 10*3/uL (ref 0.0–0.5)
Eosinophils Relative: 3 %
HCT: 29.5 % — ABNORMAL LOW (ref 39.0–52.0)
Hemoglobin: 9.4 g/dL — ABNORMAL LOW (ref 13.0–17.0)
Immature Granulocytes: 0 %
Lymphocytes Relative: 17 %
Lymphs Abs: 1.3 10*3/uL (ref 0.7–4.0)
MCH: 33.1 pg (ref 26.0–34.0)
MCHC: 31.9 g/dL (ref 30.0–36.0)
MCV: 103.9 fL — ABNORMAL HIGH (ref 80.0–100.0)
Monocytes Absolute: 0.7 10*3/uL (ref 0.1–1.0)
Monocytes Relative: 10 %
Neutro Abs: 5.3 10*3/uL (ref 1.7–7.7)
Neutrophils Relative %: 70 %
Platelets: 175 10*3/uL (ref 150–400)
RBC: 2.84 MIL/uL — ABNORMAL LOW (ref 4.22–5.81)
RDW: 13.8 % (ref 11.5–15.5)
WBC: 7.6 10*3/uL (ref 4.0–10.5)
nRBC: 0 % (ref 0.0–0.2)

## 2022-03-15 LAB — COMPREHENSIVE METABOLIC PANEL
ALT: 5 U/L (ref 0–44)
AST: 15 U/L (ref 15–41)
Albumin: 2.9 g/dL — ABNORMAL LOW (ref 3.5–5.0)
Alkaline Phosphatase: 61 U/L (ref 38–126)
Anion gap: 8 (ref 5–15)
BUN: 31 mg/dL — ABNORMAL HIGH (ref 8–23)
CO2: 25 mmol/L (ref 22–32)
Calcium: 8.7 mg/dL — ABNORMAL LOW (ref 8.9–10.3)
Chloride: 109 mmol/L (ref 98–111)
Creatinine, Ser: 1.38 mg/dL — ABNORMAL HIGH (ref 0.61–1.24)
GFR, Estimated: 53 mL/min — ABNORMAL LOW (ref >60)
Glucose, Bld: 99 mg/dL (ref 70–99)
Potassium: 3.9 mmol/L (ref 3.5–5.1)
Sodium: 142 mmol/L (ref 135–145)
Total Bilirubin: 1 mg/dL (ref 0.3–1.2)
Total Protein: 5.8 g/dL — ABNORMAL LOW (ref 6.5–8.1)

## 2022-03-15 LAB — GLUCOSE, CAPILLARY
Glucose-Capillary: 116 mg/dL — ABNORMAL HIGH (ref 70–99)
Glucose-Capillary: 122 mg/dL — ABNORMAL HIGH (ref 70–99)
Glucose-Capillary: 127 mg/dL — ABNORMAL HIGH (ref 70–99)
Glucose-Capillary: 136 mg/dL — ABNORMAL HIGH (ref 70–99)
Glucose-Capillary: 144 mg/dL — ABNORMAL HIGH (ref 70–99)
Glucose-Capillary: 156 mg/dL — ABNORMAL HIGH (ref 70–99)
Glucose-Capillary: 176 mg/dL — ABNORMAL HIGH (ref 70–99)
Glucose-Capillary: 192 mg/dL — ABNORMAL HIGH (ref 70–99)
Glucose-Capillary: 96 mg/dL (ref 70–99)

## 2022-03-15 LAB — MAGNESIUM: Magnesium: 2.1 mg/dL (ref 1.7–2.4)

## 2022-03-15 LAB — PHOSPHORUS: Phosphorus: 3.8 mg/dL (ref 2.5–4.6)

## 2022-03-15 NOTE — Progress Notes (Signed)
?PROGRESS NOTE ? ? ? ?Kevin [name]  WEX:[id number] DOB: [birth year] DOA: 03/09/2022 ?PCP: [name], MD  ? 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  Patient states that he tripped on his trash can and the wife states that he usually uses a walker but did not do so at the time when he fell.  He did not hit his head and did not lose consciousness but after his fall he had pain in the left shoulder and left hip is brought to the ED.  X-rays in the ED showed a left hip fracture and a left proximal humerus fracture.  Orthopedic surgery was consulted and the plan is for surgical intervention of his left hip and this was done by [name] on 03/10/22 and he underwent left proximal humerus fractures s/p ORIF by [name] on 03/11/22.  PT OT evaluated and recommending CIR and he was screened and appears to be a potential candidate for CIR.  Per orthopedic surgery [name] he is to be weightbearing as tolerated left lower extremity with posterior hip precautions for 6 weeks and recommending Plavix and Lovenox for 4 weeks after surgery however given his acute drop from hemoglobin his Lovenox was held.  Given that his blood count dropped a little bit orthopedic surgery also type and screen and transfuse 1 unit of PRBCs.  Per orthopedic surgery he has to be nonweightbearing in the left upper extremity but can use a platform walker to assist with mobilization and have passive shoulder abduction but no active shoulder abduction with a sling for comfort. ? ?His Blood Count is improved and Hgb/Hct is now 9.4/28.9 yesterday and today is 9.2/29.4 so Ortho  is resuming Lovenox today at 40 mg q24h. The rest of his labs are stable. Patient wanting to go home but remains a little confused due to his dementia. Will pursue CIR workup at [facility].  ? ?He is now having issues with Acute Urinary Retention. Will I and O x3 for >300 mL and if he continues to retain will place foley catheter.  ? ?Overall his hemoglobin and his renal function remained stable.  PT OT recommending CIR and he has insurance authorization but currently no bed available.  Will likely discharge tomorrow if bed is available.  He is medically stable to be discharged at this time as of 03/15/22.   ? ?Assessment and Plan: ? ?Left hip fracture and left proximal humerus fracture after mechanical fall  ?-Orthopedic surgery was consulted and appreciate their evaluation and management; plan is for surgical intervention of his left hip for hemiarthroplasty and ORIF of Shoulder  ?-Continue with morphine 0.5 mg IV every 2 as needed for severe pain and Ondansetron 4 mg IV once ?-Patient had a nerve block ?-Further care per orthopedic surgery Teams  ?-Surgery recommending Lovenox starting postoperative day 1 for 4 weeks but this is held given that he underwent surgery with [name] and had an ORIF for his left proximal humerus fracture.  Given his acute blood loss anemia Lovenox had been held by the orthopedic surgery team but now that his Hgb/Hct is stable it is being resumed today (03/14/22) ?-Orthopedic surgery recommending weightbearing as tolerated with  posterior hip precautions for 6 weeks for his leg ?-Orthopedic surgery is also recommending nonweightbearing in left upper extremity using a platform walker to assist with mobilization and use shoulder pendulums and passive and active shoulder flexion and extension with passive shoulder abduction but no active shoulder abduction.  He has unrestricted range of motion of the left elbow forearm and wrist and hand and a sling for comfort. ?-Per orthopedic surgery  dressing changes to start tomorrow to the left shoulder ?-PT OT recommending inpatient rehabilitation and he appears to be a potential CIR candidate; CIR working the patient up and [employer] obtained but currently No bed availability  ?  ?Chronic Combined Systolic and Diastolic CHF status post ICD placement  ?-Appears compensated.  Patient is not on any diuretics.   ?-Continue to monitor for S/Sx of Volume Overload ?-Patient is -1,045 mL currently  ?  ?Diabetes Mellitus Type 2 ?-Initiated on Sensitive NovoLog Sliding Scale every 4h ?-CBGs ranging from 96-136 ?-Continue to Monitor and Trend Blood Sugars carefully  ?  ?Chronic Kidney Disease Stage IIIa  ?-Creatinine usually is around 1.1-1.3. ?-Patient's BUNs/creatinine went from 20/1.40 -> 17/1.34 -> 25/1.40 -> 35/1.54 -> 31/1.48 -> 31/1.40 -> 31/1.38 (GFR 53) ?-Avoid further nephrotoxic medications, contrast dyes, hypotension and dehydration to ensure adequate renal perfusion and will need to renally adjust medications ?-Repeat CMP in the a.m. ?  ?Hypertension  ?-Presently not on any medication.  Follow closely.   ?-Will place on As needed IV hydralazine for systolic blood pressure more than 160. ?-Continue to Monitor BP per Protocol ?-Last BP was 127/70 ?  ?Leukocytosis improving  ?-Patient's WBC trend 10.2 -> 11.9 -> 13.7 -> 9.0 -> 7.7 -> 7.4 -> 7.6 ?-Likely reactive in the setting of his fall and now Surgical Intervention ?-U/A done and showed clear appearance with yellow color urine, Small Leukocytes, Negative Nitrites, many bacteria, present mucus, 0-5 RBCs per high-power field, 21-50 WBCs ?-Continue to monitor for signs and symptoms of infection; no overt infection noted ?-Repeat CBC within 1 week ?  ?History of Prior Stroke ?-He is on Plavix and this was held but now is okay to resume per orthopedic recommendation is now back on 75 mg p.o. daily ?-Back on his Atorvastatin as well with 80 mg Daily  ?  ?Hyperkalemia, resolved and improved ?-Patient's K+  went from 4.4 -> 5.3 -> 4.3 -> 3.8 -> 4.0 -> 3.9 ?-Given 10 grams of Lokelma x1 with improvement  ?-Continue to Monitor and Trend ?-Repeat CMP within 1 week   ?  ?Macrocytic Anemia with superimposed Acute Blood Loss Anemia from Post-Operative Cause ?-Patient's Hgb/Hct went from 12.2/36.0 -> 11.0/33.9 -> 9.9/30.6  -> 7.8/24.8 -> 9.4/28.9 -> 9.2/29.4 -> 9.4/29.5 and stable with an MCV of 103.9 ?-Checked Anemia Panel and showed an iron level of 20, U IBC of 207, TIBC of 227, saturation ratio 9%, ferritin level of 280, folate level 70.6, and a vitamin B12 level of 277 ?-Given his history of CHF and acute blood loss orthopedic surgery gave the patient one unit of pRBC ?-Continue to Monitor for S/Sx of Bleeding; No overt bleeding noted ?-Repeat CBC within 1 week  ?  ?Acute Urinary Retention ?-Likely due to Inability to Ambulate properly ?-I and O x3 for >300 mL in Bladder; Already has it done twice. If unable to urinate again and retaining will place foley ?-Will start Tamsulosin 0.4 mg po Daily but he has an allergy to Sulfa Abx so will need to verify side effect ?  ?  Thrombocytopenia, improving  ?-Patient's Platelet Count went from 179 -> 156 -> 142 -> 110 -> 116 -> 149 -> 175 on last check  ?-Lovenox had been held but now being resumed by Orthopedic Surgery ?-Continue to Monitor for S/Sx of Bleeding; No overt Bleeding noted ?-Repeat CBC within 1 week  ?  ?Hypoalbuminemia ?-Patient's Albumin Level was 3.2 -> 3.3 -> 3.1 -> 2.8 -> 2.9 on last check  ?-Continue to Monitor and Trend ?-Repeat CMP in the AM  ?  ?Dementia  ?-No acute issues at this time.  Resume home medications after surgery when patient can take p.o. and have now resumed his donepezil 5 mg p.o. nightly ?-He was resumed on his home Mirtazapine 15 mg p.o. nightly ?  ?DVT prophylaxis: enoxaparin (LOVENOX) injection 40 mg Start: 03/14/22 0945 ?SCDs Start: 03/12/22 0612 ?SCD's Start: 03/12/22 0612 ?SCDs Start: 03/09/22 2302 ? ?  Code Status: DNR ?Family  Communication: No family present at bedside  ? ?Disposition Plan:  ?Level of care: Telemetry Medical ?Status is: Inpatient ?Remains inpatient appropriate because: MEDICALLY STABLE TO D/C To CIR TODAY (03/15/22)   ? ?

## 2022-03-15 NOTE — Progress Notes (Addendum)
Inpatient Rehab Admissions Coordinator:  ?Saw pt at bedside. Informed him that began insurance authorization.  Will continue to follow. ? ? ?ADDENDUM 1420: Received insurance authorization. Awaiting bed availability.  ? ? ?Gayland Curry, MS, CCC-SLP ?Admissions Coordinator ?[phone] ? ?

## 2022-03-15 NOTE — Consult Note (Signed)
? ?  [facility] CM Inpatient Consult ? ? ?03/15/2022 ? ?Kevin [name] ?[birth year] ?[id number] ? ?[facility] [ACO] Patient: [name] noted ? ?*Reviewed for extreme high risk for unplanned readmission score with 6 admissions in the past 6 months noted. ? ?Primary Care Provider:  [name], MD, with [facility] is an embedded provider with a Chronic Care Management team and program, and is listed for the transition of care follow up and appointments. ? ?Patient is currently being recommended for an inpatient rehabilitation stay.  ? ?Plan: Continue to follow for disposition and needs.  A referral can be sent to the [facility] for post hospital needs. ? ?Please contact for further questions, ? ?Natividad Brood, RN BSN CCM ?[facility] Liaison ? [phone] business mobile phone ?Toll free office [phone]  ?Fax number: [phone] ?[email or web address] ? ? ? ?

## 2022-03-15 NOTE — Discharge Summary (Signed)
?Physician Discharge Summary ?  ?Patient: Kevin [name] MRN: [id number] DOB: [birth year]  ?Admit date:     03/09/2022  ?Discharge date: 03/16/22  ?Discharge Physician: Raiford Noble, DO  ? ?PCP: [name], MD  ? ?Recommendations at discharge:  ? ?Follow up with PCP within 1-2 weeks and repeat CBC,CMP, Mag, Phos within 1 week ?Follow up with Orthopedic Surgery within 1-2 weeks ?Follow up with Urology as an outpatient in 1-2 weeks for TOV ?Follow up with Cardiology within 1-2 weeks  ? ?Discharge Diagnoses: ?Principal Problem: ?  Hip fracture ([place]) ?Active Problems: ?  Chronic combined systolic and diastolic heart failure ([place]) ?  Dementia ([place]) ?  Type 2 diabetes mellitus ([place]) ?  Sick sinus syndrome ([place]) ?  DNR (do not resuscitate) ?  S/P hip replacement, left ?  Fracture of neck of left humerus, closed, initial encounter ?  Traumatic closed displaced fracture of base of neck of left femur, initial encounter ([place]) ?  Protein-calorie malnutrition, severe ? ?Resolved Problems: ?  * No resolved hospital problems. * ? ?Hospital Course: ?Patient is a 76 year old Caucasian thin male with a past medical history significant for but limited to chronic combined systolic and diastolic CHF status post ICD placement, hypertension, diabetes mellitus type 2, history of prior CVA, history of nonischemic cardiomyopathy, history of dementia as well as other comorbidities including hyperlipidemia who presented after a fall at home.  Patient states that he tripped on his trash can and the wife states that he usually uses a walker but did not do so at the time when he fell.  He did not hit his head and did not lose consciousness but after his fall he had pain in the left shoulder and left hip is brought to the ED.  X-rays in the ED showed a left hip fracture and a left proximal humerus fracture.  Orthopedic surgery was consulted and the plan is for surgical intervention of his left hip and this was done by [name] on  03/10/22 and he underwent left proximal humerus fractures s/p ORIF by [name] on 03/11/22.  PT OT evaluated and recommending CIR and he was screened and appears to be a potential candidate for CIR.  Per orthopedic surgery [name] he is to be weightbearing as tolerated left lower extremity with posterior hip precautions for 6 weeks and recommending Plavix and Lovenox for 4 weeks after surgery however given his acute drop from hemoglobin his Lovenox was held.  Given that his blood count dropped a little bit orthopedic surgery also type and screen and transfuse 1 unit of PRBCs.  Per orthopedic surgery he has to be nonweightbearing in the left upper extremity but can use a platform walker to assist with mobilization and have passive shoulder abduction but no active shoulder abduction with a sling for comfort. ? ?His Blood Count is improved and Hgb/Hct is now 9.4/28.9 yesterday and today is 9.2/29.4 so Ortho is resuming Lovenox today at 40 mg q24h. The rest of his labs are stable. Patient wanting to go home but remains a little confused due to his dementia. Will pursue CIR workup at [facility].  ? ?He is now having issues with Acute Urinary Retention. Will I and O x3 for >300 mL and if he continues to retain will place foley catheter. Foley was placed 03/15/22 given continued retention. ? ?Overall his hemoglobin and his renal function remained stable.  PT OT recommending CIR and he has insurance authorization and will D/C today since  bed is available.  He was medically stable to be discharged at this time as of 03/15/22.  ? ?Assessment and Plan: ? ?Left hip fracture and left proximal humerus fracture after mechanical fall  ?-Orthopedic surgery was consulted and appreciate their evaluation and management; plan is for surgical intervention of his left hip for hemiarthroplasty and ORIF of Shoulder  ?-Continue with morphine 0.5 mg IV every 2 as needed for severe pain and Ondansetron 4 mg IV once ?-Patient had a nerve  block ?-Further care per orthopedic surgery Teams  ?-Surgery recommending Lovenox starting postoperative day 1 for 4 weeks but this is held given that he underwent surgery with [name] and had an ORIF for his left proximal humerus fracture.  Given his acute blood loss anemia Lovenox had been held by the orthopedic surgery team but now that his Hgb/Hct is stable it is being resumed Lovenox (03/14/22) ?-Orthopedic surgery recommending weightbearing as tolerated with posterior hip precautions for 6 weeks for his leg ?-Orthopedic surgery is also recommending nonweightbearing in left upper extremity using a platform walker to assist with mobilization and use shoulder pendulums and passive and active shoulder flexion and extension with passive shoulder abduction but no active shoulder abduction.  He has unrestricted range of motion of the left elbow forearm and wrist and hand and a sling for comfort. ?-Per orthopedic surgery dressing changes to start tomorrow to the left shoulder ?-PT OT recommending inpatient rehabilitation and he appears to be a potential CIR candidate; CIR working the patient up and [employer] obtained and bed available so he is stable to D/C  ?  ?Chronic Combined Systolic and Diastolic CHF status post ICD placement  ?-Appears compensated.  Patient is not on any diuretics.   ?-Continue to monitor for S/Sx of Volume Overload ?-Patient is -1,230 mL currently  ?  ?Diabetes Mellitus Type 2 ?-Initiated on Sensitive NovoLog Sliding Scale every 4h ?-CBGs ranging from 116-176 ?-Continue to Monitor and Trend Blood Sugars carefully  ?  ?Chronic Kidney Disease Stage IIIa  ?-Creatinine usually is around 1.1-1.3. ?-Patient's BUNs/creatinine went from 20/1.40 -> 17/1.34 -> 25/1.40 -> 35/1.54 -> 31/1.48 -> 31/1.40 -> 31/1.38 (GFR 53) on last check  ?-Avoid further nephrotoxic medications, contrast dyes, hypotension and dehydration to ensure adequate renal perfusion and will need to renally adjust  medications ?-Repeat CMP within 1 week  ?  ?Hypertension  ?-Presently not on any medication.  Follow closely.   ?-Will place on As needed IV hydralazine for systolic blood pressure more than 160. ?-Continue to Monitor BP per Protocol ?-Last BP was 113/64 ?  ?Leukocytosis improving  ?-Patient's WBC trend 10.2 -> 11.9 -> 13.7 -> 9.0 -> 7.7 -> 7.4 -> 7.6 ?-Likely reactive in the setting of his fall and now Surgical Intervention ?-U/A done and showed clear appearance with yellow color urine, Small Leukocytes, Negative Nitrites, many bacteria, present mucus, 0-5 RBCs per high-power field, 21-50 WBCs ?-Continue to monitor for signs and symptoms of infection; no overt infection noted ?-Repeat CBC within 1 week ?  ?History of Prior Stroke ?-He is on Plavix and this was held but now is okay to resume per orthopedic recommendation is now back on 75 mg p.o. daily ?-Back on his Atorvastatin as well with 80 mg Daily  ?  ?Hyperkalemia, resolved and improved ?-Patient's K+ went from 4.4 -> 5.3 -> 4.3 -> 3.8 -> 4.0 -> 3.9 ?-Given 10 grams of Lokelma x1 with improvement  ?-Continue to Monitor and Trend ?-Repeat CMP within 1 week   ?  ?  Macrocytic Anemia with superimposed Acute Blood Loss Anemia from Post-Operative Cause ?-Patient's Hgb/Hct went from 12.2/36.0 -> 11.0/33.9 -> 9.9/30.6  -> 7.8/24.8 -> 9.4/28.9 -> 9.2/29.4 -> 9.4/29.5 and stable with an MCV of 103.9 on last check  ?-Checked Anemia Panel and showed an iron level of 20, U IBC of 207, TIBC of 227, saturation ratio 9%, ferritin level of 280, folate level 70.6, and a vitamin B12 level of 277 ?-Given his history of CHF and acute blood loss orthopedic surgery gave the patient one unit of pRBC ?-Continue to Monitor for S/Sx of Bleeding; No overt bleeding noted ?-Repeat CBC within 1 week  ?  ?Acute Urinary Retention ?-Likely due to Inability to Ambulate properly ?-I and O x3 for >300 mL in Bladder; Already has it done twice. If unable to urinate again and retaining will place  foley ?-Will start Tamsulosin 0.4 mg po Daily but he has an allergy to Sulfa Abx so will need to verify side effect ?  ?Thrombocytopenia, improving  ?-Patient's Platelet Count went from 179 -> 156 -> 142 -> 110 -> 116 -> 14

## 2022-03-15 NOTE — Progress Notes (Incomplete)
PMR Admission Coordinator Pre-Admission Assessment ?  ?Patient: Kevin [name] is an 76 y.o., male ?MRN: [id number] ?DOB: Sep 05, 1946 ?Height: '6\' 4"'$  (193 cm) ?Weight: 68 kg ?  ?Insurance Information ?HMO:     PPO:      PCP:      IPA:      80/20:      OTHER:  ?PRIMARY: [place]      ?Policy#: [id number]      Subscriber: patient ?CM Name: ***      Phone#: ***     Fax#: [phone] ?Pre-Cert#: ***      Employer:  ?Benefits:  Phone #: [phone]     Name:  ?Eff. Date: 04/02/18-still active     Deduct: $0      Out of Pocket Max: $0      Life Max: 100% coverage ?CIR: 100% coverage      SNF: 100% coverage ?Outpatient: 100% coverage     Co-Pay:  ?Home Health: 100% coverage      Co-Pay:  ?DME: 100% coverage     Co-Pay:  ?Providers: in-network ?SECONDARY:       Policy#:      Phone#:  ?  ?Financial Counselor:       Phone#:  ?  ?The ?Data Collection Information Summary? for patients in Inpatient Rehabilitation Facilities with attached ?Privacy Act [place] Records? was provided and verbally reviewed with: N/A ?  ?Emergency Contact Information ?Contact Information   ?  ?  Name Relation Home Work Mobile  ?  Brostrom,[name] Spouse     [phone]  ?  Cox,Cheryl Relative     [phone]  ?  ?   ?  ?  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 Pt presented to [facility] on 03/09[facility]22 after a fall without his walker. Pt hit head but denied any LOC. Pt was c/o left shoulder and left hip pain. X-ray of left hip showed closed a proximal left surgical neck fx. X-ray of left shoulder showed a closed proximal left humeral fx. Pt underwent left hip hemiarthroplasty on 03/10/22. Pt underwent ORIF of left  proximal humerus fx 03/11/22. Therapy evaluations completed and CIR recommended d/t pt's deficits in functional mobility and inability to complete ADLs independently.  ?  ?Patient's medical record from [facility] has been reviewed by the rehabilitation admission coordinator and physician. ?  ?Past Medical History  ?    ?Past Medical History:  ?Diagnosis Date  ? Agent orange exposure    ?  in [place]  ? Anemia    ? Dementia ([place])    ? DNI (do not intubate) 01/21/2022  ? DNR (do not resuscitate) 01/21/2022  ? Heart block AV second degree    ?  a. s/p PPM implant with subsequent CRTD upgrade  ? High cholesterol    ? History of colon polyps    ? LBBB (left bundle branch block)    ? Nonischemic cardiomyopathy ([place])    ?  a. MDT CRTD upgrade 2016  ? Pacemaker 08/27/2013  ?  Dual-chamber Medtronic Adapta implanted January 2013 for bradycardia with alternating bundle branch block and 2:1 atrioventricular block   ? Prostate cancer ([place])    ?  "  not been treated yet; on a wait and see" (01/22/2015)  ? Type II diabetes mellitus ([place])    ?  ?  ?Has the patient had major surgery during 100 days prior to admission? Yes ?  ?Family History   ?family history includes Cancer in his mother; Heart disease in his father; Leukemia in his father; Ovarian cancer in his mother. ?  ?Current Medications ?  ?Current Facility-Administered Medications:  ?  acetaminophen (TYLENOL) tablet 325-650 mg, 325-650 mg, Oral, Q6H PRN, Ainsley Spinner, PA-C ?  atorvastatin (LIPITOR) tablet 80 mg, 80 mg, Oral, Daily, Raiford Noble [place], [place], 80 mg at 03/15/22 4098 ?  buPROPion (WELLBUTRIN XL) 24 hr tablet 150 mg, 150 mg, Oral, Daily, Raiford Noble [place], DO, 150 mg at 03/15/22 1191 ?  Chlorhexidine Gluconate Cloth 2 % PADS 6 each, 6 each, Topical, Daily, Ainsley Spinner, PA-C, 6 each at 03/15/22 4782 ?  clopidogrel (PLAVIX) tablet 75 mg, 75 mg, Oral, Daily, Pham, Minh Q, RPH-CPP, 75 mg at 03/15/22 0903 ?  docusate sodium (COLACE) capsule 100 mg, 100 mg, Oral,  BID, Ainsley Spinner, PA-C, 100 mg at 03/15/22 9562 ?  donepezil (ARICEPT) tablet 5 mg, 5 mg, Oral, QHS, Sheikh, Omair [place], DO, 5 mg at 03/14/22 2029 ?  enoxaparin (LOVENOX) injection 40 mg, 40 mg, Subcutaneous, Q24H, Chadwell, Joshua, PA-C, 40 mg at 03/15/22 1308 ?  feeding supplement (ENSURE ENLIVE / ENSURE PLUS) liquid 237 mL, 237 mL, Oral, BID BM, Ainsley Spinner, PA-C, 237 mL at 03/15/22 0908 ?  fentaNYL (SUBLIMAZE) injection 50 mcg, 50 mcg, Intravenous, Once, Marchwiany, Virgina [place], MD ?  HYDROcodone-acetaminophen (NORCO/VICODIN) 5-325 MG per tablet 1-2 tablet, 1-2 tablet, Oral, Q6H PRN, Ainsley Spinner, PA-C, 2 tablet at 03/14/22 2029 ?  insulin aspart (novoLOG) injection 0-9 Units, 0-9 Units, Subcutaneous, Q4H, Willaim Sheng, MD, 1 Units at 03/15/22 1137 ?  memantine (NAMENDA) tablet 10 mg, 10 mg, Oral, BID, Raiford Noble [place], DO, 10 mg at 03/15/22 6578 ?  metoCLOPramide (REGLAN) tablet 5-10 mg, 5-10 mg, Oral, Q8H PRN **OR** metoCLOPramide (REGLAN) injection 5-10 mg, 5-10 mg, Intravenous, Q8H PRN, Ainsley Spinner, PA-C ?  mirtazapine (REMERON SOL-TAB) disintegrating tablet 15 mg, 15 mg, Oral, QHS, Willaim Sheng, MD, 15 mg at 03/14/22 2028 ?  morphine (PF) 2 MG/ML injection 0.5 mg, 0.5 mg, Intravenous, Q2H PRN, Willaim Sheng, MD, 0.5 mg at 03/12/22 0436 ?  ondansetron (ZOFRAN) injection 4 mg, 4 mg, Intravenous, Once, Marchwiany, Virgina [place], MD ?  ondansetron (ZOFRAN) tablet 4 mg, 4 mg, Oral, Q6H PRN **OR** ondansetron (ZOFRAN) injection 4 mg, 4 mg, Intravenous, Q6H PRN, Ainsley Spinner, PA-C ?  polyethylene glycol (MIRALAX / GLYCOLAX) packet 17 g, 17 g, Oral, BID, Raiford Noble [place], DO, 17 g at 03/15/22 4696 ?  senna-docusate (Senokot-S) tablet 1 tablet, 1 tablet, Oral, BID, Raiford Noble [place], [place], 1 tablet at 03/15/22 2952 ?  sodium zirconium cyclosilicate (LOKELMA) packet 10 g, 10 g, Oral, Once, Ainsley Spinner, PA-C ?  ?Patients Current Diet:  ?Diet Order   ?  ?         ?    Diet Carb Modified Fluid  consistency: Thin; Room service appropriate? Yes  Diet effective now       ?  ?  ?   ?  ?  ?   ?  ?  ?Precautions / Restrictions ?Precautions ?Precautions: Posterior Hip, Shoulder, Fall ?Precaution Booklet Issued: Yes (comment) ?Precaution Comments: NWB but OK for platform RW, AROM/PROM for shoulder extension/flexion, PROM  shoulder abduction (NO ACTIVE ABDUCTION), pendulums OK ?Restrictions ?Weight Bearing Restrictions: Yes ?LUE Weight Bearing: Non weight bearing ?LLE Weight Bearing: Weight bearing as tolerated ?Other Position/Activity Restrictions: posterior hip precautions  ?  ?Has the patient had 2 or more falls or a fall with injury in the past year? Yes ?  ?Prior Activity Level ?Limited Community (1-2x/wk): gets out of house 2x/week ?  ?Prior Functional Level ?Self Care: Did the patient need help bathing, dressing, using the toilet or eating? Needed some help ?  ?Indoor Mobility: Did the patient need assistance with walking from room to room (with or without device)? Independent ?  ?Stairs: Did the patient need assistance with internal or external stairs (with or without device)? Dependent. Pt is not supposed to utilize stairs per wife report.  ?  ?Functional Cognition: Did the patient need help planning regular tasks such as shopping or remembering to take medications? Needed some help ?  ?Patient Information ?  ?Patient's Response To:  ?  ?Home Assistive Devices / Equipment ?Home Assistive Devices/Equipment: CBG Meter, Dentures (specify type), Walker (specify type), Bedside commode/3-in-1 ?Home Equipment: Conservation officer, nature (2 wheels), Shower seat, Grab bars - tub/shower, Rollator (4 wheels), Hand held shower head ?  ?Prior Device Use: Indicate devices/aids used by the patient prior to current illness, exacerbation or injury? Walker ?  ?Current Functional Level ?Cognition ?  Overall Cognitive Status: History of cognitive impairments - at baseline ?Orientation Level: Oriented X4 ?General Comments: Poor recall of  precautions and poor safety awareness. ?   ?Extremity Assessment ?(includes Sensation/Coordination) ?  Upper Extremity Assessment: LUE deficits/detail ?LUE Deficits / Details: able to perform PROM for should

## 2022-03-15 NOTE — Progress Notes (Signed)
Occupational Therapy Treatment ?Patient Details ?Name: Kevin [name] ?MRN: [id number] ?DOB: May 09, 1946 ?Today's Date: 03/15/2022 ? ? ?History of present illness Pt is a 76 y.o. male admitted 03/09/22 after syncopal episode and fall while ambulating without his RW. Pt sustained L humeral and L femoral neck fxs. S/p L hip hemiarthroplasty 5/3. S/p L shoulder ORIF 5/4. PMH includes dementia, DM2, prostate CA, LBBB, nonischemic cardiomyopathy, pacemaker; of note, multiple falls with recent admissions to acute and AIR (11/2021). ?  ?OT comments ? Pt progressing well towards OT goals this session. Pt is able to complete transfers with mod A (+2 from lower recliner). Pt able to complete AROM/PROM with LUE today as ordered by MD (see exercise section below) seated EOB. Pt ambulated with min A and L Platform RW into bathroom, completed toilet transfer with vc for precautions for LUE and posterior precautions. Able to complete peri care in standing with min A. Pt completed standing grooming min A. Pt with poor recall of precautions, but able to problem solve finding the call bell to requests item from RN staff after therapy left the room (initially just politely calling out). Pt remains excellent AIR candidate.  ? ?Recommendations for follow up therapy are one component of a multi-disciplinary discharge planning process, led by the attending physician.  Recommendations may be updated based on patient status, additional functional criteria and insurance authorization. ?   ?Follow Up Recommendations ? Acute inpatient rehab (3hours/day)  ?  ?Assistance Recommended at Discharge Frequent or constant Supervision/Assistance  ?Patient can return home with the following ? A lot of help with walking and/or transfers;A lot of help with bathing/dressing/bathroom;Direct supervision/assist for medications management;Direct supervision/assist for financial management;Assist for transportation;Help with stairs or ramp for entrance ?   ?Equipment Recommendations ? Other (comment) (defer to next venue of care)  ?  ?Recommendations for Other Services   ? ?  ?Precautions / Restrictions Precautions ?Precautions: Posterior Hip;Shoulder;Fall ?Shoulder Interventions: Shoulder sling/immobilizer;For comfort ?Precaution Booklet Issued: Yes (comment) ?Precaution Comments: NWB but OK for platform RW, AROM/PROM for shoulder extension/flexion, PROM shoulder abduction (NO ACTIVE ABDUCTION), pendulums OK ?Required Braces or Orthoses: Sling ?Restrictions ?Weight Bearing Restrictions: Yes ?LUE Weight Bearing: Non weight bearing ?LLE Weight Bearing: Weight bearing as tolerated ?Other Position/Activity Restrictions: posterior hip precautions  ? ? ?  ? ?Mobility Bed Mobility ?Overal bed mobility: Needs Assistance ?Bed Mobility: Supine to Sit ?  ?  ?Supine to sit: Mod assist, HOB elevated ?  ?  ?General bed mobility comments: Assist to bring LLE off of bed, elevate trunk into sitting, and bring hips to EOB ?  ? ?Transfers ?Overall transfer level: Needs assistance ?Equipment used: Left platform walker ?Transfers: Sit to/from Stand ?Sit to Stand: +2 physical assistance, Mod assist ?  ?  ?  ?  ?  ?General transfer comment: Assist to bring hips up and to steady. Pt required +2 mod assist to rise from lower surfaces. Mod assist from higher surfaces. Verbal/tactile cues for hand placement ?  ?  ?Balance Overall balance assessment: Needs assistance ?Sitting-balance support: Feet supported, No upper extremity supported ?Sitting balance-Leahy Scale: Fair ?  ?  ?Standing balance support: Bilateral upper extremity supported, Reliant on assistive device for balance ?Standing balance-Leahy Scale: Poor ?Standing balance comment: walker and min guard for static standing ?  ?  ?  ?  ?  ?  ?  ?  ?  ?  ?  ?   ? ?ADL either performed or assessed with clinical judgement  ? ?ADL Overall  ADL's : Needs assistance/impaired ?  ?  ?Grooming: Wash/dry hands;Minimal assistance;Standing ?Grooming  Details (indicate cue type and reason): sink level ?  ?  ?  ?  ?  ?  ?Lower Body Dressing: Moderate assistance;Sit to/from stand ?Lower Body Dressing Details (indicate cue type and reason): to manage underwear, max A for socks due to posterior hip precautions ?Toilet Transfer: Minimal assistance;Ambulation;Rolling walker (2 wheels) (L PLatform) ?Toilet Transfer Details (indicate cue type and reason): mod A +2 from lower chair initially, once on feet min A ?Toileting- Clothing Manipulation and Hygiene: Moderate assistance;Sit to/from stand ?  ?  ?  ?Functional mobility during ADLs: Minimal assistance (L Platform walker, after initial mod A for stand) ?General ADL Comments: provided shoulder handout to assist with ADL ?  ? ?Extremity/Trunk Assessment Upper Extremity Assessment ?Upper Extremity Assessment: LUE deficits/detail ?LUE Deficits / Details: able to perform PROM for shoulder, AROM for elbow, wrist, hand ?LUE Sensation: WNL ?LUE Coordination: decreased gross motor ?  ?  ?  ?  ?  ? ?Vision   ?  ?  ?Perception   ?  ?Praxis   ?  ? ?Cognition Arousal/Alertness: Awake/alert ?Behavior During Therapy: [facility] for tasks assessed/performed ?Overall Cognitive Status: History of cognitive impairments - at baseline ?  ?  ?  ?  ?  ?  ?  ?  ?  ?  ?  ?  ?  ?  ?  ?  ?General Comments: Poor recall of precautions and poor safety awareness. ?  ?  ?   ?Exercises Exercises: Shoulder ?Shoulder Exercises ?Pendulum Exercise: Left, 10 reps, Standing ?Shoulder Extension: PROM, AROM, Left, 10 reps, Seated ?Shoulder ABduction: PROM, Left, 10 reps, Seated ?Elbow Flexion: AROM, Left, 10 reps, Seated ?Elbow Extension: AROM, Left, 10 reps, Seated ?Wrist Flexion: AROM, Left, Seated ?Wrist Extension: AROM, Left, Seated ?Digit Composite Flexion: AROM, Left ?Neck Flexion: AROM ?Neck Extension: AROM ?Neck Lateral Flexion - Right: AROM ?Neck Lateral Flexion - Left: AROM ? ?  ?Shoulder Instructions Shoulder Instructions ?Donning/doffing  sling/immobilizer: Maximal assistance ?Correct positioning of sling/immobilizer: Maximal assistance ?Pendulum exercises (written home exercise program): Maximal assistance ?ROM for elbow, wrist and digits of operated UE: Supervision/safety ?Sling wearing schedule (on at all times/off for ADL's): Independent ? ? ?  ?General Comments    ? ? ?Pertinent Vitals/ Pain       Pain Assessment ?Pain Assessment: 0-10 ?Pain Score: 4  ?Pain Location: Lt shoulder and lt hip ?Pain Descriptors / Indicators: Grimacing, Sore ?Pain Intervention(s): Monitored during session, RN gave pain meds during session ? ?Home Living   ?  ?  ?  ?  ?  ?  ?  ?  ?  ?  ?  ?  ?  ?  ?  ?  ?  ?  ? ?  ?Prior Functioning/Environment    ?  ?  ?  ?   ? ?Frequency ? Min 2X/week  ? ? ? ? ?  ?Progress Toward Goals ? ?OT Goals(current goals can now be found in the care plan section) ? Progress towards OT goals: Progressing toward goals ? ?Acute Rehab OT Goals ?Patient Stated Goal: get back to volunteering ?OT Goal Formulation: With patient ?Time For Goal Achievement: 03/26/22 ?Potential to Achieve Goals: Good ?ADL Goals ?Pt Will Perform Grooming: with supervision;standing ?Pt Will Perform Upper Body Bathing: with supervision;sitting ?Pt Will Perform Lower Body Bathing: with supervision;with adaptive equipment;sitting/lateral leans ?Pt Will Perform Upper Body Dressing: with min guard assist;with caregiver independent in assisting;sitting ?Pt  Will Perform Lower Body Dressing: with min guard assist;sit to/from stand;with caregiver independent in assisting ?Pt Will Transfer to Toilet: with supervision;ambulating ?Pt Will Perform Toileting - Clothing Manipulation and hygiene: with supervision;sit to/from stand ?Pt/caregiver will Perform Home Exercise Program: Left upper extremity;With Supervision;With written HEP provided  ?Plan Discharge plan remains appropriate;Frequency remains appropriate   ? ?Co-evaluation ? ? ?   ?  ?  ?  ?  ? ?  ?AM-PAC OT "6 Clicks" Daily  Activity     ?Outcome Measure ? ? Help from another person eating meals?: A Little ?Help from another person taking care of personal grooming?: A Little ?Help from another person toileting, which includes using toliet, bedpan, o

## 2022-03-15 NOTE — Progress Notes (Signed)
Physical Therapy Treatment ?Patient Details ?Name: Kevin [name] ?MRN: [id number] ?DOB: 10-22-1946 ?Today's Date: 03/15/2022 ? ? ?History of Present Illness Pt is a 76 y.o. male admitted 03/09/22 after syncopal episode and fall while ambulating without his RW. Pt sustained L humeral and L femoral neck fxs. S/p L hip hemiarthroplasty 5/3. S/p L shoulder ORIF 5/4. PMH includes dementia, DM2, prostate CA, LBBB, nonischemic cardiomyopathy, pacemaker; of note, multiple falls with recent admissions to acute and AIR (11/2021). ? ?  ?PT Comments  ? ? Pt making good progress with mobility. With poor short term memory pt needs very frequent verbal/tactile cues to maintain precautions. Feel pt is excellent AIR candidate and that he can reach supervision level with AIR.   ?Recommendations for follow up therapy are one component of a multi-disciplinary discharge planning process, led by the attending physician.  Recommendations may be updated based on patient status, additional functional criteria and insurance authorization. ? ?Follow Up Recommendations ? Acute inpatient rehab (3hours/day) ?  ?  ?Assistance Recommended at Discharge Frequent or constant Supervision/Assistance  ?Patient can return home with the following Assistance with cooking/housework;Assist for transportation;Help with stairs or ramp for entrance;Direct supervision/assist for medications management;Direct supervision/assist for financial management;A little help with walking and/or transfers;A little help with bathing/dressing/bathroom ?  ?Equipment Recommendations ? Rolling walker (2 wheels) (LUE platform walker)  ?  ?Recommendations for Other Services   ? ? ?  ?Precautions / Restrictions Precautions ?Precautions: Posterior Hip;Shoulder;Fall ?Shoulder Interventions: Shoulder sling/immobilizer;For comfort ?Precaution Booklet Issued: Yes (comment) ?Precaution Comments: NWB but OK for platform RW, AROM/PROM for shoulder extension/flexion, PROM shoulder abduction  (NO ACTIVE ABDUCTION), pendulums OK ?Required Braces or Orthoses: Sling ?Restrictions ?Weight Bearing Restrictions: Yes ?LUE Weight Bearing: Non weight bearing ?LLE Weight Bearing: Weight bearing as tolerated ?Other Position/Activity Restrictions: posterior hip precautions  ?  ? ?Mobility ? Bed Mobility ?Overal bed mobility: Needs Assistance ?Bed Mobility: Supine to Sit ?  ?  ?Supine to sit: Mod assist, HOB elevated ?  ?  ?General bed mobility comments: Assist to bring LLE off of bed, elevate trunk into sitting, and bring hips to EOB ?  ? ?Transfers ?Overall transfer level: Needs assistance ?Equipment used: Left platform walker ?Transfers: Sit to/from Stand ?Sit to Stand: +2 physical assistance, Mod assist ?  ?  ?  ?  ?  ?General transfer comment: Assist to bring hips up and to steady. Pt required +2 mod assist to rise from lower surfaces. Mod assist from higher surfaces. Verbal/tactile cues for hand placement ?  ? ?Ambulation/Gait ?Ambulation/Gait assistance: Min assist ?Gait Distance (Feet): 90 Feet ?Assistive device: Left platform walker ?Gait Pattern/deviations: Step-through pattern, Decreased step length - right, Decreased stance time - left, Knee flexed in stance - left ?Gait velocity: decr ?Gait velocity interpretation: <1.8 ft/sec, indicate of risk for recurrent falls ?  ?General Gait Details: Assist for balance and support. Verbal cues to stand more erect. ? ? ?Stairs ?  ?  ?  ?  ?  ? ? ?Wheelchair Mobility ?  ? ?Modified Rankin (Stroke Patients Only) ?  ? ? ?  ?Balance Overall balance assessment: Needs assistance ?Sitting-balance support: Feet supported, No upper extremity supported ?Sitting balance-Leahy Scale: Fair ?  ?  ?Standing balance support: Bilateral upper extremity supported, Reliant on assistive device for balance ?Standing balance-Leahy Scale: Poor ?Standing balance comment: walker and min guard for static standing ?  ?  ?  ?  ?  ?  ?  ?  ?  ?  ?  ?  ? ?  ?  Cognition Arousal/Alertness:  Awake/alert ?Behavior During Therapy: [facility] for tasks assessed/performed ?Overall Cognitive Status: History of cognitive impairments - at baseline ?  ?  ?  ?  ?  ?  ?  ?  ?  ?  ?  ?  ?  ?  ?  ?  ?General Comments: Poor recall of precautions and poor safety awareness. ?  ?  ? ?  ?Exercises   ? ?  ?General Comments   ?  ?  ? ?Pertinent Vitals/Pain Pain Assessment ?Pain Assessment: 0-10 ?Pain Score: 4  ?Pain Location: Lt shoulder and lt hip ?Pain Descriptors / Indicators: Grimacing, Sore  ? ? ?Home Living   ?  ?  ?  ?  ?  ?  ?  ?  ?  ?   ?  ?Prior Function    ?  ?  ?   ? ?PT Goals (current goals can now be found in the care plan section) Acute Rehab PT Goals ?Patient Stated Goal: post-acute rehab at [facility] before return home ?Progress towards PT goals: Progressing toward goals ? ?  ?Frequency ? ? ? Min 5X/week ? ? ? ?  ?PT Plan Current plan remains appropriate  ? ? ?Co-evaluation   ?  ?  ?  ?  ? ?  ?AM-PAC PT "6 Clicks" Mobility   ?Outcome Measure ? Help needed turning from your back to your side while in a flat bed without using bedrails?: A Lot ?Help needed moving from lying on your back to sitting on the side of a flat bed without using bedrails?: A Lot ?Help needed moving to and from a bed to a chair (including a wheelchair)?: A Lot ?Help needed standing up from a chair using your arms (e.g., wheelchair or bedside chair)?: Total ?Help needed to walk in hospital room?: A Little ?Help needed climbing 3-5 steps with a railing? : Total ?6 Click Score: 11 ? ?  ?End of Session Equipment Utilized During Treatment: Gait belt ?Activity Tolerance: Patient tolerated treatment well ?Patient left: in chair;with call bell/phone within reach;with chair alarm set ?Nurse Communication: Mobility status ?PT Visit Diagnosis: Other abnormalities of gait and mobility (R26.89);Muscle weakness (generalized) (M62.81);Pain;Repeated falls (R29.6) ?Pain - Right/Left: Left ?  ? ? ?Time: 7867-6720 ?PT Time Calculation (min) (ACUTE ONLY): 15  min ? ?Charges:  $Gait Training: 8-22 mins          ?          ? ?[facility] PT ?Acute Rehabilitation Services ?Office [phone] ? ? ? ?Shary Decamp [facility] ?03/15/2022, 9:23 AM ? ?

## 2022-03-16 ENCOUNTER — Encounter (HOSPITAL_COMMUNITY): Payer: Self-pay | Admitting: Orthopedic Surgery

## 2022-03-16 ENCOUNTER — Inpatient Hospital Stay (HOSPITAL_COMMUNITY)
Admission: RE | Admit: 2022-03-16 | Discharge: 2022-03-29 | DRG: 560 | Disposition: A | Discharge status: Home Health | Payer: No Typology Code available for payment source | Source: Intra-hospital | Attending: Physical Medicine & Rehabilitation | Admitting: Physical Medicine & Rehabilitation

## 2022-03-16 ENCOUNTER — Other Ambulatory Visit: Payer: Self-pay

## 2022-03-16 DIAGNOSIS — W010XXD Fall on same level from slipping, tripping and stumbling without subsequent striking against object, subsequent encounter: Secondary | ICD-10-CM | POA: Diagnosis present

## 2022-03-16 DIAGNOSIS — Z950 Presence of cardiac pacemaker: Secondary | ICD-10-CM

## 2022-03-16 DIAGNOSIS — G309 Alzheimer's disease, unspecified: Secondary | ICD-10-CM | POA: Diagnosis present

## 2022-03-16 DIAGNOSIS — N1831 Chronic kidney disease, stage 3a: Secondary | ICD-10-CM | POA: Diagnosis present

## 2022-03-16 DIAGNOSIS — S42212D Unspecified displaced fracture of surgical neck of left humerus, subsequent encounter for fracture with routine healing: Secondary | ICD-10-CM

## 2022-03-16 DIAGNOSIS — I5042 Chronic combined systolic (congestive) and diastolic (congestive) heart failure: Secondary | ICD-10-CM | POA: Diagnosis not present

## 2022-03-16 DIAGNOSIS — Z87891 Personal history of nicotine dependence: Secondary | ICD-10-CM

## 2022-03-16 DIAGNOSIS — I129 Hypertensive chronic kidney disease with stage 1 through stage 4 chronic kidney disease, or unspecified chronic kidney disease: Secondary | ICD-10-CM | POA: Diagnosis present

## 2022-03-16 DIAGNOSIS — E119 Type 2 diabetes mellitus without complications: Secondary | ICD-10-CM | POA: Diagnosis not present

## 2022-03-16 DIAGNOSIS — Z85828 Personal history of other malignant neoplasm of skin: Secondary | ICD-10-CM

## 2022-03-16 DIAGNOSIS — G4700 Insomnia, unspecified: Secondary | ICD-10-CM | POA: Diagnosis present

## 2022-03-16 DIAGNOSIS — M25512 Pain in left shoulder: Secondary | ICD-10-CM | POA: Diagnosis present

## 2022-03-16 DIAGNOSIS — S42212A Unspecified displaced fracture of surgical neck of left humerus, initial encounter for closed fracture: Secondary | ICD-10-CM | POA: Diagnosis present

## 2022-03-16 DIAGNOSIS — F0390 Unspecified dementia without behavioral disturbance: Secondary | ICD-10-CM

## 2022-03-16 DIAGNOSIS — E7800 Pure hypercholesterolemia, unspecified: Secondary | ICD-10-CM | POA: Diagnosis present

## 2022-03-16 DIAGNOSIS — I428 Other cardiomyopathies: Secondary | ICD-10-CM | POA: Diagnosis present

## 2022-03-16 DIAGNOSIS — E1122 Type 2 diabetes mellitus with diabetic chronic kidney disease: Secondary | ICD-10-CM | POA: Diagnosis present

## 2022-03-16 DIAGNOSIS — Z7902 Long term (current) use of antithrombotics/antiplatelets: Secondary | ICD-10-CM | POA: Diagnosis not present

## 2022-03-16 DIAGNOSIS — F0283 Dementia in other diseases classified elsewhere, unspecified severity, with mood disturbance: Secondary | ICD-10-CM | POA: Diagnosis present

## 2022-03-16 DIAGNOSIS — Z79899 Other long term (current) drug therapy: Secondary | ICD-10-CM

## 2022-03-16 DIAGNOSIS — I447 Left bundle-branch block, unspecified: Secondary | ICD-10-CM | POA: Diagnosis present

## 2022-03-16 DIAGNOSIS — Z8546 Personal history of malignant neoplasm of prostate: Secondary | ICD-10-CM | POA: Diagnosis not present

## 2022-03-16 DIAGNOSIS — D62 Acute posthemorrhagic anemia: Secondary | ICD-10-CM | POA: Diagnosis not present

## 2022-03-16 DIAGNOSIS — R627 Adult failure to thrive: Secondary | ICD-10-CM | POA: Diagnosis present

## 2022-03-16 DIAGNOSIS — M79603 Pain in arm, unspecified: Secondary | ICD-10-CM | POA: Diagnosis not present

## 2022-03-16 DIAGNOSIS — Z741 Need for assistance with personal care: Secondary | ICD-10-CM | POA: Diagnosis present

## 2022-03-16 DIAGNOSIS — N390 Urinary tract infection, site not specified: Secondary | ICD-10-CM | POA: Diagnosis not present

## 2022-03-16 DIAGNOSIS — S72002D Fracture of unspecified part of neck of left femur, subsequent encounter for closed fracture with routine healing: Secondary | ICD-10-CM | POA: Diagnosis present

## 2022-03-16 DIAGNOSIS — S72002A Fracture of unspecified part of neck of left femur, initial encounter for closed fracture: Secondary | ICD-10-CM

## 2022-03-16 DIAGNOSIS — I441 Atrioventricular block, second degree: Secondary | ICD-10-CM | POA: Diagnosis present

## 2022-03-16 DIAGNOSIS — Z888 Allergy status to other drugs, medicaments and biological substances status: Secondary | ICD-10-CM | POA: Diagnosis not present

## 2022-03-16 DIAGNOSIS — S72001A Fracture of unspecified part of neck of right femur, initial encounter for closed fracture: Secondary | ICD-10-CM | POA: Diagnosis not present

## 2022-03-16 DIAGNOSIS — Z882 Allergy status to sulfonamides status: Secondary | ICD-10-CM

## 2022-03-16 DIAGNOSIS — Z66 Do not resuscitate: Secondary | ICD-10-CM | POA: Diagnosis present

## 2022-03-16 DIAGNOSIS — S72009A Fracture of unspecified part of neck of unspecified femur, initial encounter for closed fracture: Secondary | ICD-10-CM | POA: Diagnosis present

## 2022-03-16 DIAGNOSIS — Z86010 Personal history of colonic polyps: Secondary | ICD-10-CM | POA: Diagnosis not present

## 2022-03-16 DIAGNOSIS — S72001S Fracture of unspecified part of neck of right femur, sequela: Secondary | ICD-10-CM | POA: Diagnosis not present

## 2022-03-16 DIAGNOSIS — E43 Unspecified severe protein-calorie malnutrition: Secondary | ICD-10-CM

## 2022-03-16 DIAGNOSIS — R339 Retention of urine, unspecified: Secondary | ICD-10-CM | POA: Diagnosis not present

## 2022-03-16 DIAGNOSIS — S72002S Fracture of unspecified part of neck of left femur, sequela: Secondary | ICD-10-CM | POA: Diagnosis not present

## 2022-03-16 LAB — GLUCOSE, CAPILLARY
Glucose-Capillary: 119 mg/dL — ABNORMAL HIGH (ref 70–99)
Glucose-Capillary: 129 mg/dL — ABNORMAL HIGH (ref 70–99)
Glucose-Capillary: 130 mg/dL — ABNORMAL HIGH (ref 70–99)
Glucose-Capillary: 134 mg/dL — ABNORMAL HIGH (ref 70–99)
Glucose-Capillary: 147 mg/dL — ABNORMAL HIGH (ref 70–99)
Glucose-Capillary: 159 mg/dL — ABNORMAL HIGH (ref 70–99)

## 2022-03-16 MED ORDER — MEMANTINE HCL 10 MG PO TABS
10.0000 mg | ORAL_TABLET | Freq: Two times a day (BID) | ORAL | Status: DC
  Administered 2022-03-16 – 2022-03-29 (×26): 10 mg via ORAL
  Filled 2022-03-16 (×26): qty 1

## 2022-03-16 MED ORDER — POLYETHYLENE GLYCOL 3350 17 G PO PACK: 17.0000 g | PACK | Freq: Two times a day (BID) | ORAL | 0 refills | Status: DC

## 2022-03-16 MED ORDER — ATORVASTATIN CALCIUM 80 MG PO TABS
80.0000 mg | ORAL_TABLET | Freq: Every day | ORAL | Status: DC
  Administered 2022-03-17 – 2022-03-29 (×13): 80 mg via ORAL
  Filled 2022-03-16 (×13): qty 1

## 2022-03-16 MED ORDER — TRAZODONE HCL 50 MG PO TABS
25.0000 mg | ORAL_TABLET | Freq: Every evening | ORAL | Status: DC | PRN
  Administered 2022-03-16 – 2022-03-28 (×12): 50 mg via ORAL
  Filled 2022-03-16 (×12): qty 1

## 2022-03-16 MED ORDER — LIDOCAINE HCL URETHRAL/MUCOSAL 2 % EX GEL
CUTANEOUS | Status: DC | PRN
  Administered 2022-03-24 – 2022-03-28 (×6): 6 via TOPICAL
  Administered 2022-03-29: 1 via TOPICAL
  Filled 2022-03-16 (×17): qty 6

## 2022-03-16 MED ORDER — HYDROCODONE-ACETAMINOPHEN 5-325 MG PO TABS
1.0000 | ORAL_TABLET | Freq: Four times a day (QID) | ORAL | 0 refills | Status: DC | PRN
Start: 2022-03-16 — End: 2022-03-29

## 2022-03-16 MED ORDER — PROSOURCE PLUS PO LIQD
30.0000 mL | Freq: Two times a day (BID) | ORAL | Status: DC
  Administered 2022-03-16 – 2022-03-17 (×3): 30 mL via ORAL
  Filled 2022-03-16 (×2): qty 30

## 2022-03-16 MED ORDER — INSULIN ASPART 100 UNIT/ML IJ SOLN
0.0000 [IU] | Freq: Three times a day (TID) | INTRAMUSCULAR | Status: DC
  Administered 2022-03-16: 1 [IU] via SUBCUTANEOUS
  Administered 2022-03-17: 2 [IU] via SUBCUTANEOUS
  Administered 2022-03-17 – 2022-03-18 (×2): 1 [IU] via SUBCUTANEOUS
  Administered 2022-03-18: 2 [IU] via SUBCUTANEOUS
  Administered 2022-03-18 – 2022-03-19 (×2): 1 [IU] via SUBCUTANEOUS
  Administered 2022-03-19: 2 [IU] via SUBCUTANEOUS
  Administered 2022-03-19 – 2022-03-20 (×2): 1 [IU] via SUBCUTANEOUS
  Administered 2022-03-20: 2 [IU] via SUBCUTANEOUS
  Administered 2022-03-21: 1 [IU] via SUBCUTANEOUS
  Administered 2022-03-21 – 2022-03-22 (×2): 2 [IU] via SUBCUTANEOUS
  Administered 2022-03-22: 1 [IU] via SUBCUTANEOUS
  Administered 2022-03-22 – 2022-03-24 (×4): 2 [IU] via SUBCUTANEOUS
  Administered 2022-03-24: 1 [IU] via SUBCUTANEOUS
  Administered 2022-03-25: 2 [IU] via SUBCUTANEOUS
  Administered 2022-03-25 – 2022-03-26 (×2): 1 [IU] via SUBCUTANEOUS
  Administered 2022-03-26: 2 [IU] via SUBCUTANEOUS
  Administered 2022-03-26: 3 [IU] via SUBCUTANEOUS
  Administered 2022-03-27: 1 [IU] via SUBCUTANEOUS
  Administered 2022-03-27: 3 [IU] via SUBCUTANEOUS
  Administered 2022-03-27 – 2022-03-28 (×3): 1 [IU] via SUBCUTANEOUS

## 2022-03-16 MED ORDER — DOCUSATE SODIUM 100 MG PO CAPS: 100.0000 mg | ORAL_CAPSULE | Freq: Two times a day (BID) | ORAL | 0 refills | Status: DC

## 2022-03-16 MED ORDER — MIRTAZAPINE 15 MG PO TBDP
15.0000 mg | ORAL_TABLET | Freq: Every day | ORAL | Status: DC
  Administered 2022-03-16 – 2022-03-28 (×13): 15 mg via ORAL
  Filled 2022-03-16 (×14): qty 1

## 2022-03-16 MED ORDER — ENOXAPARIN SODIUM 40 MG/0.4ML IJ SOSY: 40.0000 mg | PREFILLED_SYRINGE | INTRAMUSCULAR | Status: DC

## 2022-03-16 MED ORDER — ENSURE ENLIVE PO LIQD
237.0000 mL | Freq: Two times a day (BID) | ORAL | 12 refills | Status: DC
Start: 2022-03-16 — End: 2022-05-15

## 2022-03-16 MED ORDER — SENNOSIDES-DOCUSATE SODIUM 8.6-50 MG PO TABS
2.0000 | ORAL_TABLET | Freq: Every day | ORAL | Status: DC
  Administered 2022-03-16 – 2022-03-28 (×13): 2 via ORAL
  Filled 2022-03-16 (×12): qty 2

## 2022-03-16 MED ORDER — DONEPEZIL HCL 10 MG PO TABS
5.0000 mg | ORAL_TABLET | Freq: Every day | ORAL | Status: DC
  Administered 2022-03-16 – 2022-03-28 (×13): 5 mg via ORAL
  Filled 2022-03-16 (×13): qty 1

## 2022-03-16 MED ORDER — ACETAMINOPHEN 325 MG PO TABS
325.0000 mg | ORAL_TABLET | ORAL | Status: DC | PRN
  Administered 2022-03-16 – 2022-03-25 (×10): 650 mg via ORAL
  Filled 2022-03-16 (×10): qty 2

## 2022-03-16 MED ORDER — ATORVASTATIN CALCIUM 80 MG PO TABS: 80.0000 mg | ORAL_TABLET | Freq: Every day | ORAL | Status: DC

## 2022-03-16 MED ORDER — GUAIFENESIN-DM 100-10 MG/5ML PO SYRP: 5.0000 mL | ORAL_SOLUTION | Freq: Four times a day (QID) | ORAL | Status: DC | PRN

## 2022-03-16 MED ORDER — POLYETHYLENE GLYCOL 3350 17 G PO PACK
17.0000 g | PACK | Freq: Every day | ORAL | Status: DC | PRN
  Administered 2022-03-23 – 2022-03-26 (×3): 17 g via ORAL
  Filled 2022-03-16 (×3): qty 1

## 2022-03-16 MED ORDER — HYDROCODONE-ACETAMINOPHEN 5-325 MG PO TABS
1.0000 | ORAL_TABLET | Freq: Four times a day (QID) | ORAL | Status: DC | PRN
  Administered 2022-03-17 – 2022-03-19 (×4): 1 via ORAL
  Administered 2022-03-19 (×2): 2 via ORAL
  Administered 2022-03-22: 1 via ORAL
  Administered 2022-03-22 – 2022-03-25 (×5): 2 via ORAL
  Administered 2022-03-26: 1 via ORAL
  Administered 2022-03-26 – 2022-03-29 (×2): 2 via ORAL
  Filled 2022-03-16: qty 2
  Filled 2022-03-16: qty 1
  Filled 2022-03-16 (×2): qty 2
  Filled 2022-03-16: qty 1
  Filled 2022-03-16: qty 2
  Filled 2022-03-16: qty 1
  Filled 2022-03-16 (×3): qty 2
  Filled 2022-03-16: qty 1
  Filled 2022-03-16 (×2): qty 2
  Filled 2022-03-16: qty 1
  Filled 2022-03-16: qty 2
  Filled 2022-03-16: qty 1
  Filled 2022-03-16: qty 2

## 2022-03-16 MED ORDER — PROCHLORPERAZINE EDISYLATE 10 MG/2ML IJ SOLN: 5.0000 mg | Freq: Four times a day (QID) | INTRAMUSCULAR | Status: DC | PRN

## 2022-03-16 MED ORDER — INSULIN ASPART 100 UNIT/ML IJ SOLN
0.0000 [IU] | Freq: Every day | INTRAMUSCULAR | Status: DC
  Administered 2022-03-19: 2 [IU] via SUBCUTANEOUS

## 2022-03-16 MED ORDER — PROCHLORPERAZINE 25 MG RE SUPP: 12.5000 mg | Freq: Four times a day (QID) | RECTAL | Status: DC | PRN

## 2022-03-16 MED ORDER — DOCUSATE SODIUM 100 MG PO CAPS
100.0000 mg | ORAL_CAPSULE | Freq: Two times a day (BID) | ORAL | Status: DC
  Administered 2022-03-16 – 2022-03-29 (×26): 100 mg via ORAL
  Filled 2022-03-16 (×26): qty 1

## 2022-03-16 MED ORDER — ALUM & MAG HYDROXIDE-SIMETH 200-200-20 MG/5ML PO SUSP: 30.0000 mL | ORAL | Status: DC | PRN

## 2022-03-16 MED ORDER — MAGNESIUM GLUCONATE 500 MG PO TABS
250.0000 mg | ORAL_TABLET | Freq: Every day | ORAL | Status: DC
  Administered 2022-03-17 – 2022-03-23 (×7): 250 mg via ORAL
  Filled 2022-03-16 (×7): qty 1

## 2022-03-16 MED ORDER — CLOPIDOGREL BISULFATE 75 MG PO TABS
75.0000 mg | ORAL_TABLET | Freq: Every day | ORAL | Status: DC
  Administered 2022-03-17 – 2022-03-29 (×13): 75 mg via ORAL
  Filled 2022-03-16 (×13): qty 1

## 2022-03-16 MED ORDER — ONDANSETRON HCL 4 MG PO TABS: 4.0000 mg | ORAL_TABLET | Freq: Four times a day (QID) | ORAL | 0 refills | Status: DC | PRN

## 2022-03-16 MED ORDER — PROCHLORPERAZINE MALEATE 5 MG PO TABS: 5.0000 mg | ORAL_TABLET | Freq: Four times a day (QID) | ORAL | Status: DC | PRN

## 2022-03-16 MED ORDER — ACETAMINOPHEN 325 MG PO TABS: 325.0000 mg | ORAL_TABLET | Freq: Four times a day (QID) | ORAL | Status: DC | PRN

## 2022-03-16 MED ORDER — BUPROPION HCL ER (XL) 150 MG PO TB24
150.0000 mg | ORAL_TABLET | Freq: Every day | ORAL | Status: DC
  Administered 2022-03-17 – 2022-03-29 (×13): 150 mg via ORAL
  Filled 2022-03-16 (×13): qty 1

## 2022-03-16 MED ORDER — DIPHENHYDRAMINE HCL 12.5 MG/5ML PO ELIX: 12.5000 mg | ORAL_SOLUTION | Freq: Four times a day (QID) | ORAL | Status: DC | PRN

## 2022-03-16 MED ORDER — TETRAHYDROZOLINE HCL 0.05 % OP SOLN
1.0000 [drp] | Freq: Three times a day (TID) | OPHTHALMIC | Status: DC
  Administered 2022-03-16 – 2022-03-29 (×38): 1 [drp] via OPHTHALMIC
  Filled 2022-03-16: qty 15

## 2022-03-16 MED ORDER — SENNOSIDES-DOCUSATE SODIUM 8.6-50 MG PO TABS: 1.0000 | ORAL_TABLET | Freq: Two times a day (BID) | ORAL | Status: DC

## 2022-03-16 MED ORDER — JUVEN PO PACK
1.0000 | PACK | Freq: Two times a day (BID) | ORAL | Status: DC
Start: 2022-03-16 — End: 2022-03-17
  Administered 2022-03-16 – 2022-03-17 (×2): 1 via ORAL
  Filled 2022-03-16 (×2): qty 1

## 2022-03-16 MED ORDER — ENOXAPARIN SODIUM 40 MG/0.4ML IJ SOSY
40.0000 mg | PREFILLED_SYRINGE | INTRAMUSCULAR | Status: DC
  Administered 2022-03-17 – 2022-03-29 (×13): 40 mg via SUBCUTANEOUS
  Filled 2022-03-16 (×13): qty 0.4

## 2022-03-16 MED ORDER — BISACODYL 10 MG RE SUPP
10.0000 mg | Freq: Every day | RECTAL | Status: DC | PRN
Start: 2022-03-16 — End: 2022-03-29
  Administered 2022-03-28: 10 mg via RECTAL
  Filled 2022-03-16 (×2): qty 1

## 2022-03-16 MED ORDER — FLEET ENEMA 7-19 GM/118ML RE ENEM: 1.0000 | ENEMA | Freq: Once | RECTAL | Status: DC | PRN

## 2022-03-16 NOTE — H&P (Signed)
? ? ?Physical Medicine and Rehabilitation Admission H&P ? ?  ?Chief Complaint  ?Patient presents with  ? Functional deficits due to fall with hip/humerus fracture   ? ? ?HPI: Kevin [name]. Kevin [name] is a 76 year old male with history of T2DM, dementia, agent orange exposure, NICM/Heart block s/p PPM, prostate cancer (being monitored), FTT- followed by palliative care, recent falls with SDH 12/22  (CIR stay) who was admitted on 03/09/22 after a fall due to syncope with onset of left shoulder and left hip pain. He was found to have left humeral head and left femoral neck fracture. He was made NP and underwent Left hip hemi on 05/03-->to be WBAT with posterior hip precautions and lovenox X 4 weeks for DVT prophylaxis. He was also evaluated by [name] and underwent  ORIF Left shoulder on 05/04. OK to WB with platform walker --NWB but can WB with platform walker, OK for passive and active shoulder flex/ext, passive left shoulder abduction and NO active shoulder abduction ? ?Hospital course significant for ABLA with drop in Hgb 7.8 requiring one unit PRBC, thrombocytopenia, acute on chronic renal failure as well as urinary retention requiring foley placement on 05/08. Follow up labs showed H/H stable and lovenox added on 05/07 as platelets have improved. He continues   ? ?Pt reports pain not bad- ~ 4/10 with meds- not "much more" without meds.  ?Sleeps well with sleeping meds.  ?L shoulder/arm hurts a lot more than L hip.  ?Forgot had foley due to urinary retention.  ?LBM this AM ? ? ?Review of Systems  ?Constitutional:  Negative for chills and fever.  ?HENT:  Negative for hearing loss.   ?Eyes:  Negative for blurred vision.  ?Respiratory:  Negative for shortness of breath and stridor.   ?Cardiovascular:  Negative for chest pain.  ?Gastrointestinal:  Negative for constipation and heartburn.  ?Genitourinary:  Negative for dysuria.  ?Musculoskeletal:  Negative for myalgias.  ?Neurological:  Negative for dizziness and  headaches.  ?Psychiatric/Behavioral:  The patient is not nervous/anxious and does not have insomnia.   ?All other systems reviewed and are negative. ? ? ?Past Medical History:  ?Diagnosis Date  ? Agent orange exposure   ? in [place]  ? Anemia   ? Dementia ([place])   ? DNI (do not intubate) 01/21/2022  ? DNR (do not resuscitate) 01/21/2022  ? Heart block AV second degree   ? a. s/p PPM implant with subsequent CRTD upgrade  ? High cholesterol   ? History of colon polyps   ? LBBB (left bundle branch block)   ? Nonischemic cardiomyopathy ([place])   ? a. MDT CRTD upgrade 2016  ? Pacemaker 08/27/2013  ? Dual-chamber Medtronic Adapta implanted January 2013 for bradycardia with alternating bundle branch block and 2:1 atrioventricular block   ? Prostate cancer ([place])   ? "not been treated yet; on a wait and see" (01/22/2015)  ? Type II diabetes mellitus ([place])   ? ? ?Past Surgical History:  ?Procedure Laterality Date  ? BASAL CELL CARCINOMA EXCISION    ? "back, face, neck"  ? BI-VENTRICULAR IMPLANTABLE CARDIOVERTER DEFIBRILLATOR UPGRADE N/A 01/22/2015  ? MDT CRTD upgrade by Dr Rayann Heman  ? BIV ICD GENERATOR CHANGEOUT N/A 08/28/2020  ? Procedure: BIV ICD GENERATOR CHANGEOUT;  Surgeon: [name], MD;  Location: [place] [facility];  Service: Cardiovascular;  Laterality: N/A;  ? CARDIAC CATHETERIZATION  01/27/2012  ? normal coronaries, dilated LV c/w nonischemic cardiomyopathy  ? EXAMINATION UNDER ANESTHESIA  04/06/2012  ?  Procedure: EXAM UNDER ANESTHESIA;  Surgeon: Joyice Faster. Cornett, MD;  Location: [place];  Service: General;  Laterality: N/A;  ? HIP ARTHROPLASTY Left 03/10/2022  ? Procedure: ARTHROPLASTY BIPOLAR HIP (HEMIARTHROPLASTY);  Surgeon: Willaim Sheng, MD;  Location: [place];  Service: Orthopedics;  Laterality: Left;  ? INCISION AND DRAINAGE PERIRECTAL ABSCESS  ~ 2010; 2015  ? INGUINAL HERNIA REPAIR Left 1980  ? IR ANGIOGRAM PELVIS SELECTIVE OR SUPRASELECTIVE  10/20/2021  ? IR ANGIOGRAM VISCERAL SELECTIVE  10/20/2021  ? IR EMBO  ART  VEN HEMORR LYMPH EXTRAV  INC GUIDE ROADMAPPING  10/20/2021  ? IR US GUIDE VASC ACCESS LEFT  10/20/2021  ? LEFT HEART CATHETERIZATION WITH CORONARY ANGIOGRAM N/A 01/27/2012  ? Procedure: LEFT HEART CATHETERIZATION WITH CORONARY ANGIOGRAM;  Surgeon: Troy Sine, MD;  Location: [facility] CATH [facility];  Service: Cardiovascular;  Laterality: N/A;  ? NM MYOCAR PERF WALL MOTION  01/21/2012  ? mod-severe perfusion defect due to infarct/scar w/mild perinfarct ischemia in the apical,basal inferoseptal,basal inferior,mid inferoseptal,midinferior and apical inferior regions  ? ORIF SHOULDER FRACTURE Left 03/11/2022  ? Procedure: OPEN REDUCTION INTERNAL FIXATION (ORIF) SHOULDER FRACTURE;  Surgeon: Altamese , MD;  Location: [place];  Service: Orthopedics;  Laterality: Left;  ? pacemaker battery    ? PERMANENT PACEMAKER INSERTION N/A 12/01/2011  ? Medtronic implanted by Dr Sallyanne Kuster  ? PROSTATE BIOPSY    ? PROSTATE BIOPSY    ? shrapnel surgery    ? "in [place]"  ? TONSILLECTOMY    ? ?Family History  ?Problem Relation Age of Onset  ? Ovarian cancer Mother   ? Cancer Mother   ?     ?uterine  ? Leukemia Father   ? Heart disease Father   ? Diabetes Neg Hx   ? ? ?Social History:  reports that he quit smoking about 37 years ago. His smoking use included cigarettes. He has a 20.00 pack-year smoking history. He has never used smokeless tobacco. He reports current alcohol use. He reports that he does not use drugs. ? ? ?Allergies  ?Allergen Reactions  ? Sulfa Antibiotics Swelling  ?  Swelling of lips only.  ? 5-Alpha Reductase Inhibitors   ?  Patient and wife state OK for patient to take finasteride - has not taken it before, NKA  ? ? ?Medications Prior to Admission  ?Medication Sig Dispense Refill  ? acetaminophen (TYLENOL) 325 MG tablet Take 1-2 tablets (325-650 mg total) by mouth every 4 (four) hours as needed for mild pain.    ? atorvastatin (LIPITOR) 80 MG tablet Take 0.5 tablets (40 mg total) by mouth every evening. 30 tablet 0  ?  buPROPion (WELLBUTRIN XL) 150 MG 24 hr tablet Take 2 tablets in AM, 1 tablet in PM (Patient taking differently: Take 150 mg by mouth daily.) 270 tablet 3  ? Carboxymethylcellulose Sodium (EYE DROPS OP) Place 1 drop into both eyes 3 (three) times daily. Given by [facility] doctor to help astigmtism    ? cholecalciferol (VITAMIN D) 25 MCG (1000 UNIT) tablet Take 1 tablet (1,000 Units total) by mouth daily. 30 tablet 0  ? clopidogrel (PLAVIX) 75 MG tablet Take 75 mg by mouth daily.    ? Continuous Blood Gluc Sensor (FREESTYLE LIBRE 14 DAY SENSOR) MISC SMARTSIG:1 Each Topical Every 2 Weeks    ? diphenhydrAMINE (BENADRYL) 25 MG tablet Take 25 mg by mouth every 6 (six) hours as needed for allergies.    ? donepezil (ARICEPT) 5 MG tablet Take 1 tablet (5 mg total)  by mouth daily. 90 tablet 3  ? loratadine (CLARITIN) 10 MG tablet Take 1 tablet (10 mg total) by mouth daily. (Patient taking differently: Take 10 mg by mouth daily as needed for rhinitis.) 30 tablet 0  ? magnesium gluconate (MAGONATE) 500 MG tablet Take 0.5 tablets (250 mg total) by mouth daily. 30 tablet 0  ? memantine (NAMENDA) 10 MG tablet Take 1 tablet twice a day (Patient taking differently: Take 10 mg by mouth 2 (two) times daily.) 180 tablet 3  ? mirtazapine (REMERON SOL-TAB) 15 MG disintegrating tablet Take 1 tablet (15 mg total) by mouth at bedtime. (Patient taking differently: Take 15 mg by mouth at bedtime. For appetite) 30 tablet 3  ? polyethylene glycol (MIRALAX / GLYCOLAX) 17 g packet Take 17 g by mouth daily as needed for mild constipation. 14 each 0  ? ? ? ? ?Home: ?Home Living ?Family/patient expects to be discharged to:: Private residence ?Living Arrangements: Spouse/significant other ?Available Help at Discharge: Family, Available 24 hours/day ?Type of Home: House ?Home Access: Stairs to enter ?Entrance Stairs-Number of Steps: 2 ?Entrance Stairs-Rails: None ?Home Layout: Two level, Able to live on main level with bedroom/bathroom ?Bathroom Shower/Tub:  Walk-in shower ?Bathroom Toilet: Handicapped height ?Bathroom Accessibility: Yes ?Home Equipment: Conservation officer, nature (2 wheels), Shower seat, Grab bars - tub/shower, Rollator (4 wheels), Hand held shower head ? Lives [place]

## 2022-03-16 NOTE — Progress Notes (Signed)
Physical Therapy Treatment ?Patient Details ?Name: Kevin [name] ?MRN: [id number] ?DOB: 22-Apr-1946 ?Today's Date: 03/16/2022 ? ? ?History of Present Illness Pt is a 76 y.o. male admitted 03/09/22 after syncopal episode and fall while ambulating without his RW. Pt sustained L humeral and L femoral neck fxs. S/p L hip hemiarthroplasty 5/3. S/p L shoulder ORIF 5/4. PMH includes dementia, DM2, prostate CA, LBBB, nonischemic cardiomyopathy, pacemaker; of note, multiple falls with recent admissions to acute and [facility] (11/2021). ? ?  ?PT Comments  ? ? Pt making steady progress towards his physical therapy goals; he is very motivated to participate. Focus on LUE ROM and BLE strengthening exercises and progression of functional mobility. Pt ambulating 120 ft with a left platform walker and min assist. Needs continued cues for maintenance of precautions. Plan for d/c to [facility] today.  ?   ?Recommendations for follow up therapy are one component of a multi-disciplinary discharge planning process, led by the attending physician.  Recommendations may be updated based on patient status, additional functional criteria and insurance authorization. ? ?Follow Up Recommendations ? Acute inpatient rehab (3hours/day) ?  ?  ?Assistance Recommended at Discharge Frequent or constant Supervision/Assistance  ?Patient can return home with the following Assistance with cooking/housework;Assist for transportation;Help with stairs or ramp for entrance;Direct supervision/assist for medications management;Direct supervision/assist for financial management;A little help with walking and/or transfers;A little help with bathing/dressing/bathroom ?  ?Equipment Recommendations ? Other (comment) (Left platform RW)  ?  ?Recommendations for Other Services   ? ? ?  ?Precautions / Restrictions Precautions ?Precautions: Posterior Hip;Shoulder;Fall ?Precaution Booklet Issued: Yes (comment) ?Precaution Comments: NWB but OK for platform RW, AROM/PROM for shoulder  extension/flexion, PROM shoulder abduction (NO ACTIVE ABDUCTION), pendulums OK ?Required Braces or Orthoses: Sling ?Restrictions ?Weight Bearing Restrictions: Yes ?LUE Weight Bearing: Non weight bearing ?LLE Weight Bearing: Weight bearing as tolerated ?Other Position/Activity Restrictions: posterior hip precautions  ?  ? ?Mobility ? Bed Mobility ?Overal bed mobility: Needs Assistance ?Bed Mobility: Supine to Sit ?  ?  ?Supine to sit: Min assist ?  ?  ?General bed mobility comments: Pt initiating well, sitting up towards left side of bed. Use of bed pad to scoot hips out anteriorly ?  ? ?Transfers ?Overall transfer level: Needs assistance ?Equipment used: Left platform walker ?Transfers: Sit to/from Stand ?Sit to Stand: Min assist ?  ?  ?  ?  ?  ?General transfer comment: Cues for hand positioning, minA to rise and steady ?  ? ?Ambulation/Gait ?Ambulation/Gait assistance: Min assist ?Gait Distance (Feet): 120 Feet ?Assistive device: Left platform walker ?Gait Pattern/deviations: Step-through pattern, Decreased step length - right, Decreased stance time - left, Knee flexed in stance - left, Narrow base of support, Scissoring, Decreased dorsiflexion - left ?Gait velocity: decreased ?Gait velocity interpretation: <1.8 ft/sec, indicate of risk for recurrent falls ?  ?General Gait Details: Cues for upright posture, midling positioning, left heel strike, wider BOS. Pt with tendency to keep LLE internally rotated and inverted at rest, with knee also flexed in midstance. ? ? ?Stairs ?  ?  ?  ?  ?  ? ? ?Wheelchair Mobility ?  ? ?Modified Rankin (Stroke Patients Only) ?  ? ? ?  ?Balance Overall balance assessment: Needs assistance ?Sitting-balance support: Feet supported, No upper extremity supported ?Sitting balance-Leahy Scale: Fair ?  ?  ?Standing balance support: Bilateral upper extremity supported, Reliant on assistive device for balance ?Standing balance-Leahy Scale: Poor ?  ?  ?  ?  ?  ?  ?  ?  ?  ?  ?  ?  ?  ? ?   ?  Cognition Arousal/Alertness: Awake/alert ?Behavior During Therapy: [facility] for tasks assessed/performed ?Overall Cognitive Status: History of cognitive impairments - at baseline ?  ?  ?  ?  ?  ?  ?  ?  ?  ?  ?  ?  ?  ?  ?  ?  ?General Comments: Poor recall of precautions and poor safety awareness. ?  ?  ? ?  ?Exercises General Exercises - Upper Extremity ?Shoulder Flexion: AAROM, Left, 10 reps, Seated ?Elbow Flexion: AROM, Left, 15 reps, Seated ?General Exercises - Lower Extremity ?Long Arc Quad: Both, 10 reps, Seated ?Heel Raises: Both, 20 reps, Seated ?Other Exercises ?Other Exercises: Seated: LUE table slides x 10 ? ?  ?General Comments   ?  ?  ? ?Pertinent Vitals/Pain Pain Assessment ?Pain Assessment: No/denies pain  ? ? ?Home Living   ?  ?  ?  ?  ?  ?  ?  ?  ?  ?   ?  ?Prior Function    ?  ?  ?   ? ?PT Goals (current goals can now be found in the care plan section) Acute Rehab PT Goals ?Patient Stated Goal: go home ?Potential to Achieve Goals: Good ?Progress towards PT goals: Progressing toward goals ? ?  ?Frequency ? ? ?   ? ? ? ?  ?PT Plan Current plan remains appropriate  ? ? ?Co-evaluation   ?  ?  ?  ?  ? ?  ?AM-PAC PT "6 Clicks" Mobility   ?Outcome Measure ? Help needed turning from your back to your side while in a flat bed without using bedrails?: A Little ?Help needed moving from lying on your back to sitting on the side of a flat bed without using bedrails?: A Little ?Help needed moving to and from a bed to a chair (including a wheelchair)?: A Little ?Help needed standing up from a chair using your arms (e.g., wheelchair or bedside chair)?: A Little ?Help needed to walk in hospital room?: A Little ?Help needed climbing 3-5 steps with a railing? : A Lot ?6 Click Score: 17 ? ?  ?End of Session Equipment Utilized During Treatment: Gait belt;Other (comment) (sling) ?Activity Tolerance: Patient tolerated treatment well ?Patient left: with call bell/phone within reach;in bed;with bed alarm set ?Nurse  Communication: Mobility status ?PT Visit Diagnosis: Other abnormalities of gait and mobility (R26.89);Muscle weakness (generalized) (M62.81);Pain;Repeated falls (R29.6) ?  ? ? ?Time: 5102-5852 ?PT Time Calculation (min) (ACUTE ONLY): 25 min ? ?Charges:  $Gait Training: 8-22 mins ?$Therapeutic Exercise: 8-22 mins          ?          ? ?Wyona Almas, PT, DPT ?Acute Rehabilitation Services ?Pager [phone] ?Office [phone] ? ? ? ?Deno Etienne ?03/16/2022, 1:14 PM ? ?

## 2022-03-16 NOTE — Discharge Instructions (Addendum)
Inpatient Rehab Discharge Instructions  [facility] Discharge date and time:  03/29/22  Activities/Precautions/ Functional Status: Activity: no lifting, driving, or strenuous exercise till cleared by MD Diet: cardiac diet Wound Care: perform foley care twice a day and after BM.    Functional status:  ___ No restrictions     ___ Walk up steps independently _X__ 24/7 supervision/assistance   ___ Walk up steps with assistance ___ Intermittent supervision/assistance  ___ Bathe/dress independently ___ Walk with walker     ___ Bathe/dress with assistance ___ Walk Independently    ___ Shower independently ___ Walk with assistance    _X__ Shower with assistance _X__ No alcohol     ___ Return to work/school ________   COMMUNITY REFERRALS UPON DISCHARGE:    Home Health:   PT     OT     ST     SNA                Agency: [facility] ([facility])  Phone: [phone] *Please expect follow-up within 2-3 days to schedule your home visit. If you have not received follow-up, be sure to contact the branch directly.*   Medical Equipment/Items Ordered:left platform attachment                                                 [facility]  Special Instructions: Wear sling on left arm at all times. 2. Do not put any weight on left arm.  3. Maintain left total hip precautions till cleared.  No bending, no lifting left leg over 90 degrees, do not turn leg in or cross your legs.  4. Offer supplements between the day.   My questions have been answered and I understand these instructions. I will adhere to these goals and the provided educational materials after my discharge from the hospital.  Patient/Caregiver Signature _______________________________ Date __________  Clinician Signature _______________________________________ Date __________  Please bring this form and your medication list with you to all your follow-up doctor's appointments.

## 2022-03-16 NOTE — H&P (Signed)
?  ?Physical Medicine and Rehabilitation Admission H&P ?  ?  ?   ?Chief Complaint  ?Patient presents with  ? Functional deficits due to fall with hip/humerus fracture   ?  ?  ?HPI: Kevin [name]. Kevin [name] is a 76 year old male with history of T2DM, dementia, agent orange exposure, NICM/Heart block s/p PPM, prostate cancer (being monitored), FTT- followed by palliative care, recent falls with SDH 12/22  (C[facility] stay) who was admitted on 03/09/22 after a fall due to syncope with onset of left shoulder and left hip pain. He was found to have left humeral head and left femoral neck fracture. He was made NP and underwent Left hip hemi on 05/03-->to be WBAT with posterior hip precautions and lovenox X 4 weeks for DVT prophylaxis. He was also evaluated by [name] and underwent  ORIF Left shoulder on 05/04. OK to WB with platform walker --NWB but can WB with platform walker, OK for passive and active shoulder flex/ext, passive left shoulder abduction and NO active shoulder abduction ?  ?Hospital course significant for ABLA with drop in Hgb 7.8 requiring one unit PRBC, thrombocytopenia, acute on chronic renal failure as well as urinary retention requiring foley placement on 05/08. Follow up labs showed H/H stable and lovenox added on 05/07 as platelets have improved. He continues   ?  ?Pt reports pain not bad- ~ 4/10 with meds- not "much more" without meds.  ?Sleeps well with sleeping meds.  ?L shoulder/arm hurts a lot more than L hip.  ?Forgot had foley due to urinary retention.  ?LBM this AM ?  ?  ?Review of Systems  ?Constitutional:  Negative for chills and fever.  ?HENT:  Negative for hearing loss.   ?Eyes:  Negative for blurred vision.  ?Respiratory:  Negative for shortness of breath and stridor.   ?Cardiovascular:  Negative for chest pain.  ?Gastrointestinal:  Negative for constipation and heartburn.  ?Genitourinary:  Negative for dysuria.  ?Musculoskeletal:  Negative for myalgias.  ?Neurological:  Negative for dizziness  and headaches.  ?Psychiatric/Behavioral:  The patient is not nervous/anxious and does not have insomnia.   ?All other systems reviewed and are negative. ?  ?  ?    ?Past Medical History:  ?Diagnosis Date  ? Agent orange exposure    ?  in [place]  ? Anemia    ? Dementia ([place])    ? DNI (do not intubate) 01/21/2022  ? DNR (do not resuscitate) 01/21/2022  ? Heart block AV second degree    ?  a. s/p PPM implant with subsequent CRTD upgrade  ? High cholesterol    ? History of colon polyps    ? LBBB (left bundle branch block)    ? Nonischemic cardiomyopathy ([place])    ?  a. MDT CRTD upgrade 2016  ? Pacemaker 08/27/2013  ?  Dual-chamber Medtronic Adapta implanted January 2013 for bradycardia with alternating bundle branch block and 2:1 atrioventricular block   ? Prostate cancer ([place])    ?  "not been treated yet; on a wait and see" (01/22/2015)  ? Type II diabetes mellitus ([place])    ?  ?  ?     ?Past Surgical History:  ?Procedure Laterality Date  ? BASAL CELL CARCINOMA EXCISION      ?  "back, face, neck"  ? BI-VENTRICULAR IMPLANTABLE CARDIOVERTER DEFIBRILLATOR UPGRADE N/A 01/22/2015  ?  MDT CRTD upgrade by Dr Rayann Heman  ? BIV ICD GENERATOR CHANGEOUT N/A 08/28/2020  ?  Procedure: BIV  ICD GENERATOR CHANGEOUT;  Surgeon: [name], MD;  Location: [facility];  Service: Cardiovascular;  Laterality: N/A;  ? CARDIAC CATHETERIZATION   01/27/2012  ?  normal coronaries, dilated LV c/w nonischemic cardiomyopathy  ? EXAMINATION UNDER ANESTHESIA   04/06/2012  ?  Procedure: EXAM UNDER ANESTHESIA;  Surgeon: Joyice Faster. Cornett, MD;  Location: [facility];  Service: General;  Laterality: N/A;  ? HIP ARTHROPLASTY Left 03/10/2022  ?  Procedure: ARTHROPLASTY BIPOLAR HIP (HEMIARTHROPLASTY);  Surgeon: Willaim Sheng, MD;  Location: [facility];  Service: Orthopedics;  Laterality: Left;  ? INCISION AND DRAINAGE PERIRECTAL ABSCESS   ~ 2010; 2015  ? INGUINAL HERNIA REPAIR Left 1980  ? IR ANGIOGRAM PELVIS SELECTIVE OR SUPRASELECTIVE   10/20/2021  ? IR  ANGIOGRAM VISCERAL SELECTIVE   10/20/2021  ? IR EMBO ART  VEN HEMORR LYMPH EXTRAV  INC GUIDE ROADMAPPING   10/20/2021  ? IR US GUIDE VASC ACCESS LEFT   10/20/2021  ? LEFT HEART CATHETERIZATION WITH CORONARY ANGIOGRAM N/A 01/27/2012  ?  Procedure: LEFT HEART CATHETERIZATION WITH CORONARY ANGIOGRAM;  Surgeon: Troy Sine, MD;  Location[facility];  Service: Cardiovascular;  Laterality: N/A;  ? NM MYOCAR PERF WALL MOTION   01/21/2012  ?  mod-severe perfusion defect due to infarct/scar w/mild perinfarct ischemia in the apical,basal inferoseptal,basal inferior,mid inferoseptal,midinferior and apical inferior regions  ? ORIF SHOULDER FRACTURE Left 03/11/2022  ?  Procedure: OPEN REDUCTION INTERNAL FIXATION (ORIF) SHOULDER FRACTURE;  Surgeon: Altamese [place]ville, MD;  Location: [facility];  Service: Orthopedics;  Laterality: Left;  ? pacemaker battery      ? PERMANENT PACEMAKER INSERTION N/A 12/01/2011  ?  Medtronic implanted by Dr Sallyanne Kuster  ? PROSTATE BIOPSY      ? PROSTATE BIOPSY      ? shrapnel surgery      ?  "in [place]"  ? TONSILLECTOMY      ?  ?     ?Family History  ?Problem Relation Age of Onset  ? Ovarian cancer Mother    ? Cancer Mother    ?      ?uterine  ? Leukemia Father    ? Heart disease Father    ? Diabetes Neg Hx    ?  ?  ?Social History:  reports that he quit smoking about 37 years ago. His smoking use included cigarettes. He has a 20.00 pack-year smoking history. He has never used smokeless tobacco. He reports current alcohol use. He reports that he does not use drugs. ?  ?  ?     ?Allergies  ?Allergen Reactions  ? Sulfa Antibiotics Swelling  ?    Swelling of lips only.  ? 5-Alpha Reductase Inhibitors    ?    Patient and wife state OK for patient to take finasteride - has not taken it before, NKA  ?  ?  ?      ?Medications Prior to Admission  ?Medication Sig Dispense Refill  ? acetaminophen (TYLENOL) 325 MG tablet Take 1-2 tablets (325-650 mg total) by mouth every 4 (four) hours as needed for mild pain.      ?  atorvastatin (LIPITOR) 80 MG tablet Take 0.5 tablets (40 mg total) by mouth every evening. 30 tablet 0  ? buPROPion (WELLBUTRIN XL) 150 MG 24 hr tablet Take 2 tablets in AM, 1 tablet in PM (Patient taking differently: Take 150 mg by mouth daily.) 270 tablet 3  ? Carboxymethylcellulose Sodium (EYE DROPS OP) Place 1 drop into both  eyes 3 (three) times daily. Given by [name] doctor to help astigmtism      ? cholecalciferol (VITAMIN D) 25 MCG (1000 UNIT) tablet Take 1 tablet (1,000 Units total) by mouth daily. 30 tablet 0  ? clopidogrel (PLAVIX) 75 MG tablet Take 75 mg by mouth daily.      ? Continuous Blood Gluc Sensor (FREESTYLE LIBRE 14 DAY SENSOR) MISC SMARTSIG:1 Each Topical Every 2 Weeks      ? diphenhydrAMINE (BENADRYL) 25 MG tablet Take 25 mg by mouth every 6 (six) hours as needed for allergies.      ? donepezil (ARICEPT) 5 MG tablet Take 1 tablet (5 mg total) by mouth daily. 90 tablet 3  ? loratadine (CLARITIN) 10 MG tablet Take 1 tablet (10 mg total) by mouth daily. (Patient taking differently: Take 10 mg by mouth daily as needed for rhinitis.) 30 tablet 0  ? magnesium gluconate (MAGONATE) 500 MG tablet Take 0.5 tablets (250 mg total) by mouth daily. 30 tablet 0  ? memantine (NAMENDA) 10 MG tablet Take 1 tablet twice a day (Patient taking differently: Take 10 mg by mouth 2 (two) times daily.) 180 tablet 3  ? mirtazapine (REMERON SOL-TAB) 15 MG disintegrating tablet Take 1 tablet (15 mg total) by mouth at bedtime. (Patient taking differently: Take 15 mg by mouth at bedtime. For appetite) 30 tablet 3  ? polyethylene glycol (MIRALAX / GLYCOLAX) 17 g packet Take 17 g by mouth daily as needed for mild constipation. 14 each 0  ?  ?  ?  ?  ?Home: ?Home Living ?Family/patient expects to be discharged to:: Private residence ?Living Arrangements: Spouse/significant other ?Available Help at Discharge: Family, Available 24 hours/day ?Type of Home: House ?Home Access: Stairs to enter ?Entrance Stairs-Number of Steps:  2 ?Entrance Stairs-Rails: None ?Home Layout: Two level, Able to live on main level with bedroom/bathroom ?Bathroom Shower/Tub: Walk-in shower ?Bathroom Toilet: Handicapped height ?Bathroom Accessibility: Yes ?Home Equipm

## 2022-03-16 NOTE — Progress Notes (Addendum)
PMR Admission Coordinator Pre-Admission Assessment ? ?Patient: NIGUEL MOURE is an 76 y.o., male ?MRN: [id number] ?DOB: 08/24/1946 ?Height: '6\' 4"'$  (193 cm) ?Weight: 64.3 kg ? ?Insurance Information ?HMO:     PPO:      PCP:      IPA:      80/20:      OTHER:  ?PRIMARY: [place]      ?Policy#: [id number]      Subscriber: patient ?CM Name: Sherlynn Carbon       Phone#: [phone]     Fax#: [phone] ?Pre-Cert#: [id number]      Employer: Sherlynn Carbon called with approval 03/15/22 for 30 days with update due 6/8 ?Benefits:  Phone #: [phone]     Name:  ?Eff. Date: 04/02/18-still active     Deduct: $0      Out of Pocket Max: $0      Life Max: 100% coverage ?CIR: 100% coverage      SNF: 100% coverage ?Outpatient: 100% coverage     Co-Pay:  ?Home Health: 100% coverage      Co-Pay:  ?DME: 100% coverage     Co-Pay:  ?Providers: in-network ?SECONDARY:  Bernadene Person     Policy#: [id number]     Phone#:  ? ?Financial Counselor:       Phone#:  ? ?The ?Data Collection Information Summary? for patients in Inpatient Rehabilitation Facilities with attached ?Privacy Act [place] Records? was provided and verbally reviewed with: N/A ? ?Emergency Contact Information ?Contact Information   ? ? Name Relation Home Work Mobile  ? Rogness,[name] Spouse   [phone]  ? Cox,Cheryl Relative   [phone]  ? ?  ? ? 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 Pt presented to [facility] on [facility] after a fall without his walker. Pt hit head but denied any LOC. Pt was c/o left shoulder and left hip pain. X-ray of left hip showed closed a proximal left surgical neck fx. X-ray of left shoulder  showed a closed proximal left humeral fx. Pt underwent left hip hemiarthroplasty on 03/10/22. Pt underwent ORIF of left proximal humerus fx 03/11/22. Therapy evaluations completed and CIR recommended d/t pt's deficits in functional mobility and inability to complete ADLs independently.  ?  ? ?Patient's medical record from [facility] has been reviewed by the rehabilitation admission coordinator and physician. ? ?Past Medical History  ?Past Medical History:  ?Diagnosis Date  ? Agent orange exposure   ? in [place]  ? Anemia   ? Dementia ([place])   ? DNI (do not intubate) 01/21/2022  ? DNR (do not resuscitate) 01/21/2022  ? Heart block AV second degree   ? a. s/p PPM implant with subsequent CRTD upgrade  ? High cholesterol   ? History of colon polyps   ? LBBB (left bundle branch block)   ? Nonischemic cardiomyopathy ([place])   ? a. MDT CRTD upgrade 2016  ? Pacemaker 08/27/2013  ? Dual-chamber Medtronic Adapta implanted January 2013 for bradycardia with alternating bundle branch block and 2:1 atrioventricular block   ? Prostate cancer ([place])   ? "not been treated yet; on a wait and see" (01/22/2015)  ? Type II diabetes mellitus ([place])   ? ? ?  Has the patient had major surgery during 100 days prior to admission? Yes ? ?Family History   ?family history includes Cancer in his mother; Heart disease in his father; Leukemia in his father; Ovarian cancer in his mother. ? ?Current Medications ? ?Current Facility-Administered Medications:  ?  (feeding supplement) PROSource Plus liquid 30 mL, 30 mL, Oral, BID BM, Love, Pamela S, PA-C ?  acetaminophen (TYLENOL) tablet 325-650 mg, 325-650 mg, Oral, Q4H PRN, Love, Pamela S, PA-C ?  alum & mag hydroxide-simeth (MAALOX/MYLANTA) 200-200-20 MG/5ML suspension 30 mL, 30 mL, Oral, Q4H PRN, Love, Ivan Anchors, PA-C ?  [START ON 03/17/2022] atorvastatin (LIPITOR) tablet 80 mg, 80 mg, Oral, Daily, Love, Pamela S, PA-C ?  bisacodyl (DULCOLAX) suppository 10 mg, 10 mg, Rectal, Daily PRN, Love, Ivan Anchors,  PA-C ?  [START ON 03/17/2022] buPROPion (WELLBUTRIN XL) 24 hr tablet 150 mg, 150 mg, Oral, Daily, Love, Pamela S, PA-C ?  [START ON 03/17/2022] clopidogrel (PLAVIX) tablet 75 mg, 75 mg, Oral, Daily, Love, Pamela S, PA-C ?  diphenhydrAMINE (BENADRYL) 12.5 MG/5ML elixir 12.5-25 mg, 12.5-25 mg, Oral, Q6H PRN, Love, Pamela S, PA-C ?  docusate sodium (COLACE) capsule 100 mg, 100 mg, Oral, BID, Love, Pamela S, PA-C ?  donepezil (ARICEPT) tablet 5 mg, 5 mg, Oral, QHS, Love, Pamela S, PA-C ?  [START ON 03/17/2022] enoxaparin (LOVENOX) injection 40 mg, 40 mg, Subcutaneous, Q24H, Love, Pamela S, PA-C ?  guaiFENesin-dextromethorphan (ROBITUSSIN DM) 100-10 MG/5ML syrup 5-10 mL, 5-10 mL, Oral, Q6H PRN, Love, Pamela S, PA-C ?  HYDROcodone-acetaminophen (NORCO/VICODIN) 5-325 MG per tablet 1-2 tablet, 1-2 tablet, Oral, Q6H PRN, Love, Pamela S, PA-C ?  insulin aspart (novoLOG) injection 0-5 Units, 0-5 Units, Subcutaneous, QHS, Love, Pamela S, PA-C ?  insulin aspart (novoLOG) injection 0-9 Units, 0-9 Units, Subcutaneous, TID WC, Love, Pamela S, PA-C ?  lidocaine (XYLOCAINE) 2 % jelly, , Topical, PRN, Love, Ivan Anchors, PA-C ?  [START ON 03/17/2022] magnesium gluconate (MAGONATE) tablet 250 mg, 250 mg, Oral, Daily, Love, Pamela S, PA-C ?  memantine (NAMENDA) tablet 10 mg, 10 mg, Oral, BID, Love, Pamela S, PA-C ?  mirtazapine (REMERON SOL-TAB) disintegrating tablet 15 mg, 15 mg, Oral, QHS, Love, Pamela S, PA-C ?  nutrition supplement (JUVEN) (JUVEN) powder packet 1 packet, 1 packet, Oral, BID BM, Love, Pamela S, PA-C ?  polyethylene glycol (MIRALAX / GLYCOLAX) packet 17 g, 17 g, Oral, Daily PRN, Love, Pamela S, PA-C ?  prochlorperazine (COMPAZINE) tablet 5-10 mg, 5-10 mg, Oral, Q6H PRN **OR** prochlorperazine (COMPAZINE) injection 5-10 mg, 5-10 mg, Intramuscular, Q6H PRN **OR** prochlorperazine (COMPAZINE) suppository 12.5 mg, 12.5 mg, Rectal, Q6H PRN, Love, Pamela S, PA-C ?  senna-docusate (Senokot-S) tablet 2 tablet, 2 tablet, Oral, QPC  supper, Love, Pamela S, PA-C ?  sodium phosphate (FLEET) 7-19 GM/118ML enema 1 enema, 1 enema, Rectal, Once PRN, Love, Pamela S, PA-C ?  tetrahydrozoline 0.05 % ophthalmic solution 1 drop, 1 drop, Both Eyes, TID, Love, Pamela S, PA-C ?  traZODone (DESYREL) tablet 25-50 mg, 25-50 mg, Oral, QHS PRN, Love, Pamela S, PA-C ? ?Patients Current Diet:  ?Diet Order   ? ?       ?  Diet Carb Modified Fluid consistency: Thin; Room service appropriate? Yes  Diet effective now       ?  ? ?  ?  ? ?  ? ? ?Precautions / Restrictions ?   ? ?Has the patient had 2 or more falls or a fall with injury in the past year?  Yes ? ?Prior Activity Level ? Pt. Went out 1-2x a week ? ?Prior Functional Level ?Self Care: Did the patient need help bathing, dressing, using the toilet or eating? Needed some help ? ?Indoor Mobility: Did the patient need assistance with walking from room to room (with or without device)? Independent ? ?Stairs: Did the patient need assistance with internal or external stairs (with or without device)? Dependent. Pt is not supposed to utilize stairs per wife report.  ? ?Functional Cognition: Did the patient need help planning regular tasks such as shopping or remembering to take medications? Needed some help ? ?Patient Information ?Are you of Hispanic, Latino/a,or Spanish origin?: A. No, not of Hispanic, Latino/a, or Spanish origin ?What is your race?: A. White ?Do you need or want an interpreter to communicate with a doctor or health care staff?: 0. No ? ?Patient's Response To:  ?Health Literacy and Transportation ?Is the patient able to respond to health literacy and transportation needs?: Yes ?Health Literacy - How often do you need to have someone help you when you read instructions, pamphlets, or other written material from your doctor or pharmacy?: Never ?In the past 12 months, has lack of transportation kept you from medical appointments or from getting medications?: No ?In the past 12 months, has lack of  transportation kept you from meetings, work, or from getting things needed for daily living?: No ? ?Home Assistive Devices / Equipment ?Home Assistive Devices/Equipment: CBG Meter, Dentures (specify type), Walker (speci

## 2022-03-16 NOTE — Progress Notes (Signed)
Inpatient Rehabilitation Admission Medication Review by a Pharmacist ? ?A complete drug regimen review was completed for this patient to identify any potential clinically significant medication issues. ? ?High Risk Drug Classes Is patient taking? Indication by Medication  ?Antipsychotic Yes PRN compazine for N/V  ?Anticoagulant Yes Lovenox for VTE ppx  ?Antibiotic No   ?Opioid Yes PRN Vicodin for pain  ?Antiplatelet Yes Plavix for CVA ppx  ?Hypoglycemics/insulin Yes SSI for sugar control  ?Vasoactive Medication No   ?Chemotherapy No   ?Other Yes Namenda, Aricept for dementia ?Wellbutrin, Remeron for mood ?Lipitor for HLD  ? ? ? ?Type of Medication Issue Identified Description of Issue Recommendation(s)  ?Drug Interaction(s) (clinically significant) ?    ?Duplicate Therapy ?    ?Allergy ?    ?No Medication Administration End Date ?    ?Incorrect Dose ?    ?Additional Drug Therapy Needed ?    ?Significant med changes from prior encounter (inform family/care partners about these prior to discharge).    ?Other ?    ? ? ?Clinically significant medication issues were identified that warrant physician communication and completion of prescribed/recommended actions by midnight of the next day:  No ? ?Pharmacist comments: None ? ?Time spent performing this drug regimen review (minutes):  20 minutes ? ? ?Tad Moore ?03/16/2022 11:41 AM ?

## 2022-03-16 NOTE — Progress Notes (Signed)
Inpatient Rehab Admissions Coordinator:    I have a CIR bed for this Pt. Today. MD to enter d/c orders. RN may call report to [phone].  Bohdan Macho, MS, CCC-SLP Rehab Admissions Coordinator  [phone] (celll) [phone] (office)  

## 2022-03-16 NOTE — Progress Notes (Signed)
Called wife to get details on patient's voiding history. She reports that foley was removed by [facility] nurse around the end of March and patient has been urinating without difficulty. We discussed voiding trial with PVR checks.  He does have prostate cancer but without mets.  ?

## 2022-03-17 DIAGNOSIS — E119 Type 2 diabetes mellitus without complications: Secondary | ICD-10-CM

## 2022-03-17 DIAGNOSIS — R339 Retention of urine, unspecified: Secondary | ICD-10-CM

## 2022-03-17 DIAGNOSIS — D62 Acute posthemorrhagic anemia: Secondary | ICD-10-CM

## 2022-03-17 DIAGNOSIS — S72002S Fracture of unspecified part of neck of left femur, sequela: Secondary | ICD-10-CM

## 2022-03-17 LAB — CBC WITH DIFFERENTIAL/PLATELET
Abs Immature Granulocytes: 0.03 10*3/uL (ref 0.00–0.07)
Basophils Absolute: 0 10*3/uL (ref 0.0–0.1)
Basophils Relative: 0 %
Eosinophils Absolute: 0.1 10*3/uL (ref 0.0–0.5)
Eosinophils Relative: 2 %
HCT: 27.9 % — ABNORMAL LOW (ref 39.0–52.0)
Hemoglobin: 9.1 g/dL — ABNORMAL LOW (ref 13.0–17.0)
Immature Granulocytes: 0 %
Lymphocytes Relative: 10 %
Lymphs Abs: 0.8 10*3/uL (ref 0.7–4.0)
MCH: 33.2 pg (ref 26.0–34.0)
MCHC: 32.6 g/dL (ref 30.0–36.0)
MCV: 101.8 fL — ABNORMAL HIGH (ref 80.0–100.0)
Monocytes Absolute: 0.7 10*3/uL (ref 0.1–1.0)
Monocytes Relative: 9 %
Neutro Abs: 6.7 10*3/uL (ref 1.7–7.7)
Neutrophils Relative %: 79 %
Platelets: 199 10*3/uL (ref 150–400)
RBC: 2.74 MIL/uL — ABNORMAL LOW (ref 4.22–5.81)
RDW: 13.7 % (ref 11.5–15.5)
WBC: 8.4 10*3/uL (ref 4.0–10.5)
nRBC: 0 % (ref 0.0–0.2)

## 2022-03-17 LAB — COMPREHENSIVE METABOLIC PANEL
ALT: 7 U/L (ref 0–44)
AST: 16 U/L (ref 15–41)
Albumin: 2.7 g/dL — ABNORMAL LOW (ref 3.5–5.0)
Alkaline Phosphatase: 68 U/L (ref 38–126)
Anion gap: 5 (ref 5–15)
BUN: 33 mg/dL — ABNORMAL HIGH (ref 8–23)
CO2: 24 mmol/L (ref 22–32)
Calcium: 8.5 mg/dL — ABNORMAL LOW (ref 8.9–10.3)
Chloride: 108 mmol/L (ref 98–111)
Creatinine, Ser: 1.31 mg/dL — ABNORMAL HIGH (ref 0.61–1.24)
GFR, Estimated: 56 mL/min — ABNORMAL LOW (ref >60)
Glucose, Bld: 118 mg/dL — ABNORMAL HIGH (ref 70–99)
Potassium: 3.9 mmol/L (ref 3.5–5.1)
Sodium: 137 mmol/L (ref 135–145)
Total Bilirubin: 1 mg/dL (ref 0.3–1.2)
Total Protein: 5.5 g/dL — ABNORMAL LOW (ref 6.5–8.1)

## 2022-03-17 LAB — GLUCOSE, CAPILLARY
Glucose-Capillary: 123 mg/dL — ABNORMAL HIGH (ref 70–99)
Glucose-Capillary: 117 mg/dL — ABNORMAL HIGH (ref 70–99)
Glucose-Capillary: 165 mg/dL — ABNORMAL HIGH (ref 70–99)
Glucose-Capillary: 142 mg/dL — ABNORMAL HIGH (ref 70–99)

## 2022-03-17 MED ORDER — ENSURE ENLIVE PO LIQD
237.0000 mL | Freq: Two times a day (BID) | ORAL | Status: DC
  Administered 2022-03-18 – 2022-03-24 (×11): 237 mL via ORAL

## 2022-03-17 MED ORDER — ADULT MULTIVITAMIN W/MINERALS CH
1.0000 | ORAL_TABLET | Freq: Every day | ORAL | Status: DC
  Administered 2022-03-17 – 2022-03-29 (×13): 1 via ORAL
  Filled 2022-03-17 (×13): qty 1

## 2022-03-17 NOTE — Progress Notes (Signed)
?                                                       PROGRESS NOTE ? ? ?Subjective/Complaints: ?Had a reasonable night. Getting up to have bm. Pain with transfers/mobility ? ?ROS: Patient denies fever, rash, sore throat, blurred vision, dizziness, nausea, vomiting, diarrhea, cough, shortness of breath or chest pain, headache, or mood change.  ? ?Objective: ?  ?No results found. ?Recent Labs  ?  03/15/22 ?0100 03/17/22 ?5102  ?WBC 7.6 8.4  ?HGB 9.4* 9.1*  ?HCT 29.5* 27.9*  ?PLT 175 199  ? ?Recent Labs  ?  03/15/22 ?0100 03/17/22 ?5852  ?NA 142 137  ?K 3.9 3.9  ?CL 109 108  ?CO2 25 24  ?GLUCOSE 99 118*  ?BUN 31* 33*  ?CREATININE 1.38* 1.31*  ?CALCIUM 8.7* 8.5*  ? ? ?Intake/Output Summary (Last 24 hours) at 03/17/2022 0805 ?Last data filed at 03/17/2022 0725 ?Gross per 24 hour  ?Intake 360 ml  ?Output 700 ml  ?Net -340 ml  ?  ? ?  ? ?Physical Exam: ?Vital Signs ?Blood pressure 104/72, pulse 97, temperature 97.7 ?F (36.5 ?C), resp. rate 17, height '6\' 4"'$  (1.93 m), weight 64.3 kg, SpO2 95 %. ? ?General: Alert and oriented x 3, No apparent distress ?HEENT: Head is normocephalic, atraumatic, PERRLA, EOMI, sclera anicteric, oral mucosa pink and moist, dentition intact, ext ear canals clear,  ?Neck: Supple without JVD or lymphadenopathy ?Heart: Reg rate and rhythm. No murmurs rubs or gallops ?Chest: CTA bilaterally without wheezes, rales, or rhonchi; no distress ?Abdomen: Soft, non-tender, non-distended, bowel sounds positive. ?Extremities: No clubbing, cyanosis, or edema. Pulses are 2+ ?Psych: Pt's affect is appropriate. Pt is cooperative ?Skin: a few scattered bruises. Left hip wound, shoulder wound dressed/clean ?Neuro:  Alert and oriented x 3. Normal insight and awareness. Intact Memory. Normal language and speech. Cranial nerve exam unremarkable  ?Musculoskeletal: left hip tender, still swollen  ? ? ?Assessment/Plan: ?1. Functional deficits which require 3+ hours per day of interdisciplinary therapy in a comprehensive  inpatient rehab setting. ?Physiatrist is providing close team supervision and 24 hour management of active medical problems listed below. ?Physiatrist and rehab team continue to assess barriers to discharge/monitor patient progress toward functional and medical goals ? ?Care Tool: ? ?Bathing ?   ?   ?   ?  ?  ?Bathing assist   ?  ?  ?Upper Body Dressing/Undressing ?Upper body dressing   ?  ?   ?Upper body assist   ?   ?Lower Body Dressing/Undressing ?Lower body dressing ? ? ?   ?  ? ?  ? ?Lower body assist   ?   ? ?Toileting ?Toileting    ?Toileting assist Assist for toileting: Moderate Assistance - Patient 50 - 74% ?  ?  ?Transfers ?Chair/bed transfer ? ?Transfers assist ?   ? ?Chair/bed transfer assist level: Moderate Assistance - Patient 50 - 74% ?  ?  ?Locomotion ?Ambulation ? ? ?Ambulation assist ? ?   ? ?  ?  ?   ? ?Walk 10 feet activity ? ? ?Assist ?   ? ?  ?   ? ?Walk 50 feet activity ? ? ?Assist   ? ?  ?   ? ? ?Walk 150 feet activity ? ? ?Assist   ? ?  ?  ?  ? ?  Walk 10 feet on uneven surface  ?activity ? ? ?Assist   ? ? ?  ?   ? ?Wheelchair ? ? ? ? ?Assist   ?  ?  ? ?  ?   ? ? ?Wheelchair 50 feet with 2 turns activity ? ? ? ?Assist ? ?  ?  ? ? ?   ? ?Wheelchair 150 feet activity  ? ? ? ?Assist ?   ? ? ?   ? ?Blood pressure 104/72, pulse 97, temperature 97.7 ?F (36.5 ?C), resp. rate 17, height '6\' 4"'$  (1.93 m), weight 64.3 kg, SpO2 95 %. ? ?Medical Problem List and Plan: ?1. Functional deficits secondary to fall with L femoral neck fracture- and L humeral fx-  ?            -patient may  shower- cover L incision ?            -ELOS/Goals: 5-7 days mod I to supervision ? -Patient is beginning CIR therapies today including PT and OT  ?2.  Antithrombotics: ?-DVT/anticoagulation:  Pharmaceutical: Lovenox X 4 weeks started  ?            -antiplatelet therapy:  Plavix ?3. Pain Management: Hydrocodone prn.  ?4. Mood: LCSW to follow for evaluation and support.  ?            -antipsychotic agents: N/A ?5. Neuropsych:  This patient is not fully  capable of making decisions on his own behalf. ?6. Skin/Wound Care: Routine pressure relief measures ?            --Left hip dressing to stay in place for 2 weeks--appears clean. ?            --Left shoulder foam dressings changed every 1-2 days.  ?7. Fluids/Electrolytes/Nutrition: Monitor I/O. Check CMET in am.  ?8. Left proximal Humerus Fx s/p ORIF- NWB LUE with platform walker per [name] ?            --passive and active shoulder flex/ext, passive (NO ACTIVE) shoulder abduction ?            --sling prn and unrestricted ROM L-elbow, forearm, wrist, hand ?9. Left femoral neck Fx s/p hemi: WBAT LLE with posterior hip precautions ?            --Aquacel dressing intact till follow up appt 2 weeks w/Dr. Yetta Numbers ?10. Thrombocytopenia: Has resolved from 156-->110-->175 ?            --Lovenox started on 03/14/22 ?11. ABLA: Stable post transfusion. Continue to monitor H/H. ?            --5/10 hgb 9.1. stable ?12. Acute on chronic renal failure: BUN/SCR 17/1.34 at admission-->31/1.38. ?            --encourage fluid intake.  ? -5/10 bun/cr in line with prior labs ?13. Depression/Insomnia: Managed with Wellbutrin and Remeron.  ?14.  Dementia: Stable on Namenda and Aricept.  ? -observe for carryover with therapies.  ?15. T2DM: Monitor BS ac/hs. Use SSI for elevated BS ?            --added juven/prostat as nutritional supplement.  ? CBG (last 3)  ?Recent Labs  ?  03/16/22 ?1634 03/16/22 ?2056 03/17/22 ?8185  ?GLUCAP 134* 159* 123*  ?  ?16. Urinary retention: Has prostate cancer with mets? ?            --foley out. Begin voiding trial--monitor output/residuals ?17. Bowels: had bm this morning ? ?LOS: ?1 days ?  A FACE TO FACE EVALUATION WAS PERFORMED ? ?Meredith Staggers ?03/17/2022, 8:05 AM  ? ?  ?

## 2022-03-17 NOTE — Progress Notes (Signed)
Remote ICD transmission.   

## 2022-03-17 NOTE — Evaluation (Signed)
Physical Therapy Assessment and Plan ? ?Patient Details  ?Name: Kevin [name] ?MRN: [id number] ?Date of Birth: 1946/09/04 ? ?PT Diagnosis: Difficulty walking and Muscle weakness ?Rehab Potential: Good ?ELOS: 7 Days  ? ?Today's Date: 03/17/2022 ?PT Individual Time: 1100-1200 and 1534-1630 ?PT Individual Time Calculation (min): 60 min and 56 min  ? ?Hospital Problem: Principal Problem: ?  Hip fracture requiring operative repair [facility]) ?Active Problems: ?  Urinary retention with incomplete bladder emptying ?  Fracture of neck of left humerus, closed, initial encounter ? ? ?Past Medical History:  ?Past Medical History:  ?Diagnosis Date  ? Agent orange exposure   ? in [place]  ? Anemia   ? Dementia ([place])   ? DNI (do not intubate) 01/21/2022  ? DNR (do not resuscitate) 01/21/2022  ? Heart block AV second degree   ? a. s/p PPM implant with subsequent CRTD upgrade  ? High cholesterol   ? History of colon polyps   ? LBBB (left bundle branch block)   ? Nonischemic cardiomyopathy ([place])   ? a. MDT CRTD upgrade 2016  ? Pacemaker 08/27/2013  ? Dual-chamber Medtronic Adapta implanted January 2013 for bradycardia with alternating bundle branch block and 2:1 atrioventricular block   ? Prostate cancer ([place])   ? "not been treated yet; on a wait and see" (01/22/2015)  ? Type II diabetes mellitus ([place])   ? ?Past Surgical History:  ?Past Surgical History:  ?Procedure Laterality Date  ? BASAL CELL CARCINOMA EXCISION    ? "back, face, neck"  ? BI-VENTRICULAR IMPLANTABLE CARDIOVERTER DEFIBRILLATOR UPGRADE N/A 01/22/2015  ? MDT CRTD upgrade by Dr Rayann Heman  ? BIV ICD GENERATOR CHANGEOUT N/A 08/28/2020  ? Procedure: BIV ICD GENERATOR CHANGEOUT;  Surgeon: [name], MD;  Location: [place] CV LAB;  Service: Cardiovascular;  Laterality: N/A;  ? CARDIAC CATHETERIZATION  01/27/2012  ? normal coronaries, dilated LV c/w nonischemic cardiomyopathy  ? EXAMINATION UNDER ANESTHESIA  04/06/2012  ? Procedure: EXAM UNDER ANESTHESIA;  Surgeon: Joyice Faster.  Cornett, MD;  Location: [place];  Service: General;  Laterality: N/A;  ? HIP ARTHROPLASTY Left 03/10/2022  ? Procedure: ARTHROPLASTY BIPOLAR HIP (HEMIARTHROPLASTY);  Surgeon: Willaim Sheng, MD;  Location: [place];  Service: Orthopedics;  Laterality: Left;  ? INCISION AND DRAINAGE PERIRECTAL ABSCESS  ~ 2010; 2015  ? INGUINAL HERNIA REPAIR Left 1980  ? IR ANGIOGRAM PELVIS SELECTIVE OR SUPRASELECTIVE  10/20/2021  ? IR ANGIOGRAM VISCERAL SELECTIVE  10/20/2021  ? IR EMBO ART  VEN HEMORR LYMPH EXTRAV  INC GUIDE ROADMAPPING  10/20/2021  ? IR US GUIDE VASC ACCESS LEFT  10/20/2021  ? LEFT HEART CATHETERIZATION WITH CORONARY ANGIOGRAM N/A 01/27/2012  ? Procedure: LEFT HEART CATHETERIZATION WITH CORONARY ANGIOGRAM;  Surgeon: Troy Sine, MD;  Location: [facility] CATH LAB;  Service: Cardiovascular;  Laterality: N/A;  ? NM MYOCAR PERF WALL MOTION  01/21/2012  ? mod-severe perfusion defect due to infarct/scar w/mild perinfarct ischemia in the apical,basal inferoseptal,basal inferior,mid inferoseptal,midinferior and apical inferior regions  ? ORIF SHOULDER FRACTURE Left 03/11/2022  ? Procedure: OPEN REDUCTION INTERNAL FIXATION (ORIF) SHOULDER FRACTURE;  Surgeon: Altamese Lovingston, MD;  Location: [place];  Service: Orthopedics;  Laterality: Left;  ? pacemaker battery    ? PERMANENT PACEMAKER INSERTION N/A 12/01/2011  ? Medtronic implanted by Dr Sallyanne Kuster  ? PROSTATE BIOPSY    ? PROSTATE BIOPSY    ? shrapnel surgery    ? "in [place]"  ? TONSILLECTOMY    ? ? ?Assessment & Plan ?Clinical Impression:  Patient is a 76 year old male with history of T2DM, dementia, agent orange exposure, NICM/Heart block s/p PPM, prostate cancer (being monitored), FTT- followed by palliative care, recent falls with SDH 12/22  (CIR stay) who was admitted on 03/09/22 after a fall due to syncope with onset of left shoulder and left hip pain. He was found to have left humeral head and left femoral neck fracture. He was made NP and underwent Left hip hemi on 05/03-->to be  WBAT with posterior hip precautions and lovenox X 4 weeks for DVT prophylaxis. He was also evaluated by [name] and underwent  ORIF Left shoulder on 05/04. OK to WB with platform walker --NWB but can WB with platform walker, OK for passive and active shoulder flex/ext, passive left shoulder abduction and NO active shoulder abduction ?  ?Hospital course significant for ABLA with drop in Hgb 7.8 requiring one unit PRBC, thrombocytopenia, acute on chronic renal failure as well as urinary retention requiring foley placement on 05/08. Follow up labs showed H/H stable and lovenox added on 05/07 as platelets have improved  Patient transferred to CIR on 03/16/2022 .  ? ?Patient currently requires min with mobility secondary to muscle weakness, decreased cardiorespiratoy endurance, decreased memory, and decreased sitting balance, decreased standing balance, decreased postural control, decreased balance strategies, and difficulty maintaining precautions.  Prior to hospitalization, patient was modified independent  with mobility and lived with Spouse in a House home.  Home access is 2Stairs to enter. ? ?Patient will benefit from skilled PT intervention to maximize safe functional mobility, minimize fall risk, and decrease caregiver burden for planned discharge home with intermittent assist.  Anticipate patient will benefit from follow up [facility] at discharge. ? ?PT - End of Session ?Endurance Deficit: Yes ?Endurance Deficit Description: generalized weakness ? ? ?PT Evaluation ?Precautions/Restrictions ?Precautions ?Precautions: Posterior Hip;Shoulder;Fall ?Shoulder Interventions: Shoulder sling/immobilizer;For comfort ?Precaution Booklet Issued: Yes (comment) ?Precaution Comments: NWB but OK for platform RW, AROM/PROM for shoulder extension/flexion, PROM shoulder abduction (NO ACTIVE ABDUCTION), pendulums OK ?Required Braces or Orthoses: Sling ?Restrictions ?Weight Bearing Restrictions: Yes ?LUE Weight Bearing: Non weight  bearing ?LLE Weight Bearing: Weight bearing as tolerated ?Other Position/Activity Restrictions: posterior hip precautions ?General ?Chart Reviewed: Yes ?Family/Caregiver Present: No  ?Pain Interference ?Pain Interference ?Pain Effect on Sleep: 1. Rarely or not at all ?Pain Interference with Therapy Activities: 1. Rarely or not at all ?Pain Interference with Day-to-Day Activities: 1. Rarely or not at all ?Home Living/Prior Functioning ?Home Living ?Available Help at Discharge: Family;Available 24 hours/day ?Type of Home: House ?Home Access: Stairs to enter ?Entrance Stairs-Number of Steps: 2 ?Entrance Stairs-Rails: None ?Home Layout: Two level;Able to live on main level with bedroom/bathroom ?Bathroom Shower/Tub: Walk-in shower ?Bathroom Toilet: Handicapped height ?Bathroom Accessibility: Yes ?Additional Comments: Information patient provided matches home set up info from prior admission ? Lives With: Spouse ?Prior Function ?Level of Independence: Needs assistance with gait;Requires assistive device for independence;Needs assistance with ADLs ((S) overall) ?Driving: No ?Vocation: Retired ?Vision/Perception  ?Vision - History ?Ability to See in Adequate Light: 0 Adequate ?Perception ?Perception: Within Functional Limits ?Praxis ?Praxis: Intact  ?Cognition ?Overall Cognitive Status: History of cognitive impairments - at baseline ?Arousal/Alertness: Awake/alert ?Orientation Level: Oriented X4 ?Attention: Selective ?Selective Attention: Appears intact ?Memory: Impaired ?Memory Impairment: Decreased short term memory;Decreased recall of new information ?Decreased Short Term Memory: Verbal basic;Functional basic ?Awareness: Impaired ?Awareness Impairment: Emergent impairment ?Problem Solving: Appears intact ?Safety/Judgment: Appears intact ?Comments: Baseline dementia dx- deficits in STM and higher level cognition ?Sensation ?Sensation ?Light  Touch: Appears Intact ?Hot/Cold: Appears Intact ?Coordination ?Gross Motor  Movements are Fluid and Coordinated: No ?Fine Motor Movements are Fluid and Coordinated: No ?Coordination and Movement Description: L shoulder fx and L hip- pain guarding and weightbearing restrictions in the LUE ?Motor

## 2022-03-17 NOTE — Progress Notes (Signed)
Initial Nutrition Assessment ? ?DOCUMENTATION CODES:  ? ?Severe malnutrition in context of chronic illness, Underweight ? ?INTERVENTION:  ? ?Multivitamin w/ minerals daily ?Ensure Enlive po BID, each supplement provides 350 kcal and 20 grams of protein. ?Magic cup TID with meals, each supplement provides 290 kcal and 9 grams of protein ?Discontinue ProSource Plus & Juven ?Liberalize pt diet to regular due to malnutrition and underweight.  ? ?NUTRITION DIAGNOSIS:  ? ?Severe Malnutrition related to chronic illness (CHF, dementia) as evidenced by severe muscle depletion, severe fat depletion. ? ?GOAL:  ? ?Patient will meet greater than or equal to 90% of their needs ? ?MONITOR:  ? ?PO intake, Supplement acceptance, Labs, Weight trends ? ?REASON FOR ASSESSMENT:  ? ?Other (Comment) (Low BMI) ?  ? ?ASSESSMENT:  ? ?76 y.o. male admitted to CIR after L humeral head and L femoral neck fracture, underwent surgical repair for both. PMH includes CHF, T2DM, dementia, prostate, malnutrition, CKD IIIa, and HTN.  ? ?Pt resting in bed, family at bedside.  ? ?Pt reports that he has been eating great here and at home. States that he was hungry now and wanted a brownie. Family states that pt eats all the time at home.  ?Per EMR, pt PO intake includes 75% of dinner on 5/9 and 25% of breakfast on 5/10. ? ?Per EMR, pt has had a 12% weight loss within the past 6 months, this is clinically significant for time frame. Pt reports that he uses a walker at home to ambulate.  ? ?Discussed home DM regimen, wife reports that pt was taken off all medications related to diabetes some time back due to having his levels being stable.  ? ?Discussed ONS with pt regarding increased needs s/p surgery and therapy. Pt and family agreeable to supplements. Pt states that the ProSource and Juven are ok, likes Ensure from having on the acute side.  ? ?Medications reviewed and include: Colace, SSI 0-5 units daily +0-9 units TID, Magnesium Gluconate,  Senokot ?Labs reviewed: Hgb A1c 5.3% (01/08/22), 24 hr CBG 123-159 ? ?NUTRITION - FOCUSED PHYSICAL EXAM: ? ?Flowsheet Row Most Recent Value  ?Orbital Region Severe depletion  ?Upper Arm Region Severe depletion  ?Thoracic and Lumbar Region Severe depletion  ?Buccal Region Severe depletion  ?Temple Region Severe depletion  ?Clavicle Bone Region Severe depletion  ?Clavicle and Acromion Bone Region Severe depletion  ?Scapular Bone Region Severe depletion  ?Dorsal Hand Severe depletion  ?Patellar Region Severe depletion  ?Anterior Thigh Region Severe depletion  ?Posterior Calf Region Severe depletion  ?Edema (RD Assessment) None  ?Hair Reviewed  ?Eyes Reviewed  ?Mouth Reviewed  ?Skin Reviewed  ?Nails Reviewed  ? ?Diet Order:   ?Diet Order   ? ?       ?  Diet Carb Modified Fluid consistency: Thin; Room service appropriate? Yes  Diet effective now       ?  ? ?  ?  ? ?  ? ? ?EDUCATION NEEDS:  ? ?No education needs have been identified at this time ? ?Skin:  Skin Assessment: Reviewed RN Assessment ? ?Last BM:  5/10 - Type 5 ? ?Height:  ?Ht Readings from Last 1 Encounters:  ?03/16/22 '6\' 4"'$  (1.93 m)  ? ?Weight:  ?Wt Readings from Last 1 Encounters:  ?03/16/22 64.3 kg  ? ?Ideal Body Weight:  91.8 kg ? ?BMI:  Body mass index is 17.26 kg/m?. ? ?Estimated Nutritional Needs:  ?Kcal:  2000-2200 ?Protein:  100-115 grams ?Fluid:  >/= 2 L ? ? ?Kristene Liberati  Alroy Dust RD, LDN ?Clinical Dietitian ?See AMiON for contact information.  ?

## 2022-03-17 NOTE — Progress Notes (Signed)
Inpatient Rehabilitation  Patient information reviewed and entered into eRehab system by Josepha Barbier M. Chestine Belknap, M.A., CCC/SLP, PPS Coordinator.  Information including medical coding, functional ability and quality indicators will be reviewed and updated through discharge.    

## 2022-03-17 NOTE — Evaluation (Signed)
Occupational Therapy Assessment and Plan ? ?Patient Details  ?Name: Kevin [name] ?MRN: [id number] ?Date of Birth: September 20, 1946 ? ?OT Diagnosis: acute pain, muscle weakness (generalized), and swelling of limb ?Rehab Potential: Rehab Potential (ACUTE ONLY): Good ?ELOS: 5-7 days  ? ?Today's Date: 03/17/2022 ?OT Individual Time: 7425-9563 ?OT Individual Time Calculation (min): 60 min    ? ?Hospital Problem: Principal Problem: ?  Hip fracture requiring operative repair [facility]) ?Active Problems: ?  Urinary retention with incomplete bladder emptying ?  Fracture of neck of left humerus, closed, initial encounter ? ? ?Past Medical History:  ?Past Medical History:  ?Diagnosis Date  ? Agent orange exposure   ? in [place]  ? Anemia   ? Dementia ([place])   ? DNI (do not intubate) 01/21/2022  ? DNR (do not resuscitate) 01/21/2022  ? Heart block AV second degree   ? a. s/p PPM implant with subsequent CRTD upgrade  ? High cholesterol   ? History of colon polyps   ? LBBB (left bundle branch block)   ? Nonischemic cardiomyopathy ([place])   ? a. MDT CRTD upgrade 2016  ? Pacemaker 08/27/2013  ? Dual-chamber Medtronic Adapta implanted January 2013 for bradycardia with alternating bundle branch block and 2:1 atrioventricular block   ? Prostate cancer ([place])   ? "not been treated yet; on a wait and see" (01/22/2015)  ? Type II diabetes mellitus ([place])   ? ?Past Surgical History:  ?Past Surgical History:  ?Procedure Laterality Date  ? BASAL CELL CARCINOMA EXCISION    ? "back, face, neck"  ? BI-VENTRICULAR IMPLANTABLE CARDIOVERTER DEFIBRILLATOR UPGRADE N/A 01/22/2015  ? MDT CRTD upgrade by Dr Rayann Heman  ? BIV ICD GENERATOR CHANGEOUT N/A 08/28/2020  ? Procedure: BIV ICD GENERATOR CHANGEOUT;  Surgeon: [name], MD;  Location: [place] CV LAB;  Service: Cardiovascular;  Laterality: N/A;  ? CARDIAC CATHETERIZATION  01/27/2012  ? normal coronaries, dilated LV c/w nonischemic cardiomyopathy  ? EXAMINATION UNDER ANESTHESIA  04/06/2012  ? Procedure: EXAM  UNDER ANESTHESIA;  Surgeon: Joyice Faster. Cornett, MD;  Location: [place];  Service: General;  Laterality: N/A;  ? HIP ARTHROPLASTY Left 03/10/2022  ? Procedure: ARTHROPLASTY BIPOLAR HIP (HEMIARTHROPLASTY);  Surgeon: Willaim Sheng, MD;  Location: [place];  Service: Orthopedics;  Laterality: Left;  ? INCISION AND DRAINAGE PERIRECTAL ABSCESS  ~ 2010; 2015  ? INGUINAL HERNIA REPAIR Left 1980  ? IR ANGIOGRAM PELVIS SELECTIVE OR SUPRASELECTIVE  10/20/2021  ? IR ANGIOGRAM VISCERAL SELECTIVE  10/20/2021  ? IR EMBO ART  VEN HEMORR LYMPH EXTRAV  INC GUIDE ROADMAPPING  10/20/2021  ? IR US GUIDE VASC ACCESS LEFT  10/20/2021  ? LEFT HEART CATHETERIZATION WITH CORONARY ANGIOGRAM N/A 01/27/2012  ? Procedure: LEFT HEART CATHETERIZATION WITH CORONARY ANGIOGRAM;  Surgeon: Troy Sine, MD;  Location: [facility] CATH LAB;  Service: Cardiovascular;  Laterality: N/A;  ? NM MYOCAR PERF WALL MOTION  01/21/2012  ? mod-severe perfusion defect due to infarct/scar w/mild perinfarct ischemia in the apical,basal inferoseptal,basal inferior,mid inferoseptal,midinferior and apical inferior regions  ? ORIF SHOULDER FRACTURE Left 03/11/2022  ? Procedure: OPEN REDUCTION INTERNAL FIXATION (ORIF) SHOULDER FRACTURE;  Surgeon: Altamese Elrama, MD;  Location: [place];  Service: Orthopedics;  Laterality: Left;  ? pacemaker battery    ? PERMANENT PACEMAKER INSERTION N/A 12/01/2011  ? Medtronic implanted by Dr Sallyanne Kuster  ? PROSTATE BIOPSY    ? PROSTATE BIOPSY    ? shrapnel surgery    ? "in [place]"  ? TONSILLECTOMY    ? ? ?  Assessment & Plan ?Clinical Impression: Kevin [name] is a 76 year old male with history of T2DM, dementia, agent orange exposure, NICM/Heart block s/p PPM, prostate cancer (being monitored), FTT- followed by palliative care, recent falls with SDH 12/22  (CIR stay) who was admitted on 03/09/22 after a fall due to syncope with onset of left shoulder and left hip pain. He was found to have left humeral head and left femoral neck fracture. He was made  NP and underwent Left hip hemi on 05/03-->to be WBAT with posterior hip precautions and lovenox X 4 weeks for DVT prophylaxis. He was also evaluated by [name] and underwent  ORIF Left shoulder on 05/04. OK to WB with platform walker --NWB but can WB with platform walker, OK for passive and active shoulder flex/ext, passive left shoulder abduction and NO active shoulder abduction ?  ?Hospital course significant for ABLA with drop in Hgb 7.8 requiring one unit PRBC, thrombocytopenia, acute on chronic renal failure as well as urinary retention requiring foley placement on 05/08. Follow up labs showed H/H stable and lovenox added on 05/07 as platelets have improved. He continues Patient transferred to CIR on 03/16/2022 .   ? ?Patient currently requires min with basic self-care skills secondary to muscle weakness, decreased cardiorespiratoy endurance, decreased problem solving and delayed processing, and decreased standing balance, decreased postural control, and decreased balance strategies.  Prior to hospitalization, patient could complete ADLs with modified independent . ? ?Patient will benefit from skilled intervention to decrease level of assist with basic self-care skills and increase level of independence with iADL prior to discharge home with care partner.  Anticipate patient will require 24 hour supervision and follow up home health. ? ?OT - End of Session ?Activity Tolerance: Tolerates 10 - 20 min activity with multiple rests ?Endurance Deficit: Yes ?Endurance Deficit Description: generalized weakness ?OT Assessment ?Rehab Potential (ACUTE ONLY): Good ?OT Patient demonstrates impairments in the following area(s): Balance;Edema;Endurance;Motor;Pain;Safety;Skin Integrity ?OT Basic ADL's Functional Problem(s): Bathing;Dressing;Toileting ?OT Transfers Functional Problem(s): Toilet;Tub/Shower ?OT Additional Impairment(s): None ?OT Plan ?OT Intensity: Minimum of 1-2 x/day, 45 to 90 minutes ?OT Frequency: 5 out of 7  days ?OT Duration/Estimated Length of Stay: 5-7 days ?OT Treatment/Interventions: Environmental education officer education;Therapeutic Activities;Therapeutic Exercise;Cognitive remediation/compensation;Psychosocial support;Functional mobility training;Community reintegration;Self Care/advanced ADL retraining;UE/LE Strength taining/ROM;UE/LE Coordination activities;Discharge planning;Disease mangement/prevention;Pain management;Skin care/wound managment;Splinting/orthotics ?OT Self Feeding Anticipated Outcome(s): no goal set ?OT Basic Self-Care Anticipated Outcome(s): (S) ?OT Toileting Anticipated Outcome(s): (S) ?OT Bathroom Transfers Anticipated Outcome(s): (S) ?OT Recommendation ?Patient destination: Home ?Follow Up Recommendations: Home health OT ?Equipment Recommended: To be determined ? ? ?OT Evaluation ?Precautions/Restrictions  ?Precautions ?Precautions: Posterior Hip;Shoulder;Fall ?Shoulder Interventions: Shoulder sling/immobilizer;For comfort ?Precaution Booklet Issued: Yes (comment) ?Precaution Comments: NWB but OK for platform RW, AROM/PROM for shoulder extension/flexion, PROM shoulder abduction (NO ACTIVE ABDUCTION), pendulums OK ?Required Braces or Orthoses: Sling ?Restrictions ?Weight Bearing Restrictions: Yes ?LUE Weight Bearing: Non weight bearing ?LLE Weight Bearing: Weight bearing as tolerated ?Other Position/Activity Restrictions: posterior hip precautions ?General ?Chart Reviewed: Yes ?Family/Caregiver Present: No ?  ?Pain ?Pain Assessment ?Pain Scale: 0-10 ?Pain Score: 0-No pain ?Home Living/Prior Functioning ?Home Living ?Family/patient expects to be discharged to:: Private residence ?Living Arrangements: Spouse/significant other ?Available Help at Discharge: Family, Available 24 hours/day ?Type of Home: House ?Home Access: Stairs to enter ?Entrance Stairs-Number of Steps: 2 ?Entrance Stairs-Rails: None ?Home Layout: Two level, Able to live on main  level with bedroom/bathroom ?Bathroom Shower/Tub: Walk-in shower ?Bathroom Toilet: Handicapped height ?Bathroom Accessibility: Yes ?Additional Comments: Information  patient provided matches home set up

## 2022-03-17 NOTE — Progress Notes (Signed)
Foley removed per order. Pt tolerated well, no complaints at this time.  ?

## 2022-03-17 NOTE — Progress Notes (Addendum)
Patient ID: WRIGHT GRAVELY, male   DOB: 07-17-1946, 76 y.o.   MRN: [id number] ? ?SW emailed [place] H&P needed, and waiting on  updates about any potential VA benefits other than HHA.  ? ?SW confirmed with [facility] they continue to follow. ? ?Loralee Pacas, MSW, LCSWA ?Office: [phone] ?Cell: [phone] ?Fax: [phone]  ?

## 2022-03-17 NOTE — Progress Notes (Incomplete Revision)
Patient ID: Kevin [name], male   DOB: May 29, 1946, 76 y.o.   MRN: [id number] ? ?SW emailed [place] H&P needed, and waiting on  updates about any potential VA benefits other than HHA.  ? ?SW confirmed with [facility] they continue to follow. ? ?Loralee Pacas, MSW, LCSWA ?Office: [phone] ?Cell: [phone] ?Fax: [phone]  ?

## 2022-03-18 LAB — GLUCOSE, CAPILLARY
Glucose-Capillary: 134 mg/dL — ABNORMAL HIGH (ref 70–99)
Glucose-Capillary: 145 mg/dL — ABNORMAL HIGH (ref 70–99)
Glucose-Capillary: 177 mg/dL — ABNORMAL HIGH (ref 70–99)
Glucose-Capillary: 113 mg/dL — ABNORMAL HIGH (ref 70–99)

## 2022-03-18 NOTE — Care Management (Signed)
Inpatient Rehabilitation Center ?Individual Statement of Services ? ?Patient Name:  Kevin [name]  ?Date:  03/18/2022 ? ?Welcome to the [facility].  Our goal is to provide you with an individualized program based on your diagnosis and situation, designed to meet your specific needs.  With this comprehensive rehabilitation program, you will be expected to participate in at least 3 hours of rehabilitation therapies Monday-Friday, with modified therapy programming on the weekends. ? ?Your rehabilitation program will include the following services:  Physical Therapy (PT), Occupational Therapy (OT), 24 hour per day rehabilitation nursing, Therapeutic Recreaction (TR), Psychology, Neuropsychology, Care Coordinator, Rehabilitation Medicine, Nutrition Services, Pharmacy Services, and Other ? ?Weekly team conferences will be held on Tuesdays to discuss your progress.  Your Inpatient Rehabilitation Care Coordinator will talk with you frequently to get your input and to update you on team discussions.  Team conferences with you and your family in attendance may also be held. ? ?Expected length of stay: 5-7 days ?   ?Overall anticipated outcome: Supervision ? ?Depending on your progress and recovery, your program may change. Your Inpatient Rehabilitation Care Coordinator will coordinate services and will keep you informed of any changes. Your Inpatient Rehabilitation Care Coordinator's name and contact numbers are listed  below. ? ?The following services may also be recommended but are not provided by the [facility]:  ?Driving Evaluations ?Home Health Rehabiltiation Services ?Outpatient Rehabilitation Services ?Vocational Rehabilitation ?  ?Arrangements will be made to provide these services after discharge if needed.  Arrangements include referral to agencies that provide these services. ? ?Your insurance has been verified to be:  Human resources officer ? ?Your primary doctor is:   Darnell Level Burchette ? ?Pertinent information will be shared with your doctor and your insurance company. ? ?Inpatient Rehabilitation Care Coordinator:  Cathleen Corti [phone] or (C) [phone] ? ?Information discussed with and copy given to patient by: Rana Snare, 03/18/2022, 11:22 AM    ?

## 2022-03-18 NOTE — Progress Notes (Signed)
?                                                       PROGRESS NOTE ? ? ?Subjective/Complaints: ?Pt without issues. Therapy went well. Was able to sleep. Pain controlled. Anxious to get home ? ?ROS: Patient denies fever, rash, sore throat, blurred vision, dizziness, nausea, vomiting, diarrhea, cough, shortness of breath or chest pain,  back/neck pain, headache, or mood change.  ? ?Objective: ?  ?No results found. ?Recent Labs  ?  03/17/22 ?1751  ?WBC 8.4  ?HGB 9.1*  ?HCT 27.9*  ?PLT 199  ? ?Recent Labs  ?  03/17/22 ?0258  ?NA 137  ?K 3.9  ?CL 108  ?CO2 24  ?GLUCOSE 118*  ?BUN 33*  ?CREATININE 1.31*  ?CALCIUM 8.5*  ? ? ?Intake/Output Summary (Last 24 hours) at 03/18/2022 0910 ?Last data filed at 03/18/2022 5277 ?Gross per 24 hour  ?Intake 480 ml  ?Output 250 ml  ?Net 230 ml  ?  ? ?  ? ?Physical Exam: ?Vital Signs ?Blood pressure 135/61, pulse 85, temperature 98.2 ?F (36.8 ?C), resp. rate 18, height '6\' 4"'$  (1.93 m), weight 64.3 kg, SpO2 96 %. ? ?Constitutional: No distress . Vital signs reviewed. ?HEENT: NCAT, EOMI, oral membranes moist ?Neck: supple ?Cardiovascular: RRR without murmur. No JVD    ?Respiratory/Chest: CTA Bilaterally without wheezes or rales. Normal effort    ?GI/Abdomen: BS +, non-tender, non-distended ?Ext: no clubbing, cyanosis, or edema ?Psych: pleasant and cooperative  ?Skin: a few scattered bruises. Left hip wound, shoulder wounds dressed/clean with post-op dressings/foam ?Neuro:  Alert and oriented x 3. Normal insight and awareness. Intact Memory. Normal language and speech. Cranial nerve exam unremarkable  ?Musculoskeletal: left hip tender, still slightly swollen  ? ? ?Assessment/Plan: ?1. Functional deficits which require 3+ hours per day of interdisciplinary therapy in a comprehensive inpatient rehab setting. ?Physiatrist is providing close team supervision and 24 hour management of active medical problems listed below. ?Physiatrist and rehab team continue to assess barriers to  discharge/monitor patient progress toward functional and medical goals ? ?Care Tool: ? ?Bathing ?   ?Body parts bathed by patient: Right arm, Left arm, Chest, Abdomen, Front perineal area, Buttocks, Right upper leg, Left upper leg, Face  ? Body parts bathed by helper: Right lower leg, Left lower leg ?  ?  ?Bathing assist   ?  ?  ?Upper Body Dressing/Undressing ?Upper body dressing   ?  ?   ?Upper body assist Assist Level: Minimal Assistance - Patient > 75% ?   ?Lower Body Dressing/Undressing ?Lower body dressing ? ? ?   ?What is the patient wearing?: Pants ? ?  ? ?Lower body assist Assist for lower body dressing: Moderate Assistance - Patient 50 - 74% ?   ? ?Toileting ?Toileting    ?Toileting assist Assist for toileting: Moderate Assistance - Patient 50 - 74% ?  ?  ?Transfers ?Chair/bed transfer ? ?Transfers assist ?   ? ?Chair/bed transfer assist level: Moderate Assistance - Patient 50 - 74% ?  ?  ?Locomotion ?Ambulation ? ? ?Ambulation assist ? ?   ? ?Assist level: Minimal Assistance - Patient > 75% ?Assistive device: Walker-platform ?Max distance: 200'  ? ?Walk 10 feet activity ? ? ?Assist ?   ? ?Assist level: Minimal Assistance - Patient > 75% ?Assistive device:  Walker-platform  ? ?Walk 50 feet activity ? ? ?Assist   ? ?Assist level: Minimal Assistance - Patient > 75% ?Assistive device: Walker-platform  ? ? ?Walk 150 feet activity ? ? ?Assist   ? ?Assist level: Minimal Assistance - Patient > 75% ?Assistive device: Walker-platform ?  ? ?Walk 10 feet on uneven surface  ?activity ? ? ?Assist   ? ? ?Assist level: Minimal Assistance - Patient > 75% ?Assistive device: Walker-platform  ? ?Wheelchair ? ? ? ? ?Assist Is the patient using a wheelchair?: No ?  ?  ? ?  ?   ? ? ?Wheelchair 50 feet with 2 turns activity ? ? ? ?Assist ? ?  ?  ? ? ?   ? ?Wheelchair 150 feet activity  ? ? ? ?Assist ?   ? ? ?   ? ?Blood pressure 135/61, pulse 85, temperature 98.2 ?F (36.8 ?C), resp. rate 18, height '6\' 4"'$  (1.93 m), weight 64.3 kg,  SpO2 96 %. ? ?Medical Problem List and Plan: ?1. Functional deficits secondary to fall with L femoral neck fracture- and L humeral fx-  ?            -patient may  shower- cover L incision ?            -ELOS/Goals: 5-7 days mod I to supervision ? -Continue CIR therapies including PT, OT  ?2.  Antithrombotics: ?-DVT/anticoagulation:  Pharmaceutical: Lovenox X 4 weeks started  ?            -antiplatelet therapy:  Plavix ?3. Pain Management: Hydrocodone prn.  ?4. Mood: LCSW to follow for evaluation and support.  ?            -antipsychotic agents: N/A ?5. Neuropsych: This patient is not fully  capable of making decisions on his own behalf. ?6. Skin/Wound Care: Routine pressure relief measures ?            --Left hip dressing to stay in place for 2 weeks--no drainage ?            --Left shoulder foam dressings changed every 1-2 days.  ?7. Fluids/Electrolytes/Nutrition: Monitor I/O. Check CMET in am.  ?8. Left proximal Humerus Fx s/p ORIF- NWB LUE with platform walker per [name] ?            --passive and active shoulder flex/ext, passive (NO ACTIVE) shoulder abduction ?            --sling prn and unrestricted ROM L-elbow, forearm, wrist, hand ?9. Left femoral neck Fx s/p hemi: WBAT LLE with posterior hip precautions ?            --Aquacel dressing intact till follow up appt 2 weeks w/Dr. Yetta Numbers ?10. Thrombocytopenia: resolved ?11. ABLA: Stable post transfusion. Continue to monitor H/H. ?            --5/10 hgb 9.1. stable ?12. Acute on chronic renal failure: BUN/SCR 17/1.34 at admission-->31/1.38. ?            --encourage fluid intake.  ? -5/10 bun/cr in line with prior labs ?13. Depression/Insomnia: Managed with Wellbutrin and Remeron.  ?14.  Dementia: Stable on Namenda and Aricept.  ? -observe for carryover with therapies.  ?15. T2DM: Monitor BS ac/hs. Use SSI for elevated BS ?            --added juven/prostat as nutritional supplement.  ? CBG (last 3)  ?Recent Labs  ?  03/17/22 ?1657 03/17/22 ?2200  03/18/22 ?0626  ?GLUCAP 165*  142* 134*  ?  ?16. Urinary retention: Has prostate cancer with mets? ?            --foley out.   voiding trial--successful, low residuals ?17. Bowels: moving bowels ? ?LOS: ?2 days ?A FACE TO FACE EVALUATION WAS PERFORMED ? ?Meredith Staggers ?03/18/2022, 9:10 AM  ? ?  ?

## 2022-03-18 NOTE — Progress Notes (Signed)
Physical Therapy Session Note ? ?Patient Details  ?Name: Kevin [name] ?MRN: [id number] ?Date of Birth: 22-Oct-1946 ? ?Today's Date: 03/18/2022 ?PT Individual Time: 9381-0175 ?PT Individual Time Calculation (min): 75 min  ? ?Short Term Goals: ?Week 1:  PT Short Term Goal 1 (Week 1): STGs = LTGs due to ELOS ? ?Skilled Therapeutic Interventions/Progress Updates:  ?  Pt received seated in recliner in room, agreeable to PT session. No complaints of pain. Sit to stand with min to mod A to L PFRW throughout session with cues for safe hand placement and anterior weight shift during transfer. Pt frequently assisting with LUE during transfer despite cues for NWB. Ambulation up to 200 ft with L PFRW during session with CGA increasing to min A with onset of fatigue. Pt exhibits narrow BOS and some scissoring of LE that increases with fatigue. Ascend/descend 4 x 6" stairs with one handrail and mod A for balance, cues to not use LUE to assist. Standing alt L/R 6" step-taps with RUE support and min A for balance, 2 x 10 reps. Attempt standing alt L/R target taps with L PFRW and CGA for balance but pt unable to maintain L hip precautions during task. Pt needs cues to remember 3/3 hip precautions. Sit to stand 2 x 5 reps with no AD and min to mod A with focus on anterior weight shift during transfer. Pt reports urge to toilet at end of session. Toilet transfer with L PFRW and elevated BSC over toilet, mod A to stand from toilet. Pt returned to recliner at end of session, needs in reach, quick release belt and chair alarm in place at end of session. ? ?Therapy Documentation ?Precautions:  ?Precautions ?Precautions: Posterior Hip, Shoulder, Fall ?Shoulder Interventions: Shoulder sling/immobilizer, For comfort ?Precaution Booklet Issued: Yes (comment) ?Precaution Comments: NWB but OK for platform RW, AROM/PROM for shoulder extension/flexion, PROM shoulder abduction (NO ACTIVE ABDUCTION), pendulums OK ?Required Braces or Orthoses:  Sling ?Restrictions ?Weight Bearing Restrictions: Yes ?LUE Weight Bearing: Non weight bearing ?LLE Weight Bearing: Weight bearing as tolerated ?Other Position/Activity Restrictions: posterior hip precautions ? ? ? ? ? ? ?Therapy/Group: Individual Therapy ? ? ?Excell Seltzer, PT, DPT, CSRS ?03/18/2022, 3:29 PM  ?

## 2022-03-18 NOTE — Progress Notes (Signed)
Pts wife would like for him to be on something to increase his appetite. She states that he isn't eating well. ? ?Kiran Carline B Ronnald Ramp ? ?

## 2022-03-18 NOTE — Progress Notes (Signed)
Patient ID: Kevin [name], male   DOB: May 13, 1946, 76 y.o.   MRN: [id number] ? ?This SW familiar with pt due to previous admission. SW spoke with pt wife [name] to dicuss d/c plan. 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 Reports that there are no other natural supports, and she has applied for the aide care program. DME: RW, rollator, and BSC. SW shared ELOS 5-7 days. Fam edu for Monday (5/15) 1pm until completed. ? ?[facility] Cassie reports pt is 100% service connected. Reports contract for [facility] sent last month and resumption orders needed for [facility] only.  ? ?Loralee Pacas, MSW, LCSWA ?Office: [phone] ?Cell: [phone] ?Fax: [phone]  ?

## 2022-03-18 NOTE — Progress Notes (Signed)
Physical Therapy Session Note ? ?Patient Details  ?Name: Kevin [name] ?MRN: [id number] ?Date of Birth: December 19, 1945 ? ?Today's Date: 03/18/2022 ?PT Individual Time: 2297-9892 ?PT Individual Time Calculation (min): 72 min  ? ?Short Term Goals: ?Week 1:  PT Short Term Goal 1 (Week 1): STGs = LTGs due to ELOS ? ?Skilled Therapeutic Interventions/Progress Updates:  ?   ?Pt received supine in bed and agrees to therapy. No complaint of pain. Supine to sit with modA and cues for sequencing and body mechanics. Pt requires maxA to scoot toward EOB while maintaining posterior hip precautions and NWB through L upper extremity. Pt performs sit to stand with minA and L platform RW and cues for hand placement and anterior weight shift. Pt ambulates to toilet with CGA and cues for RW management. Pt unable to void successfully.  ? ?Pt stands from [facility] with minA and no AD to don pants. WC transport to gym for time management. Pt stands from [facility] with CGA and cues for hand placement, hip positioning, and anterior weight shift. Pt ambulates x400' with platform RW and cues for upright gaze to improve posture and balance, maintaining adequate stance width to improve balance and ensure pt adheres to hip precautions, including limiting internal rotation of L hip, and increasing R stride length to reciprocally increase stance time on the L. Following seated rest break, pt ambulates additional 200' with similar cueing. CGA required for safety. When transitioning from stand to sit, PT places foot behind pt's L foot to prevent excessive hip flexion while sitting. ? ?Pt performs sit to stand with platform RW and CGA. IN standing, pt performs x20 heel raises and x20 toe raises for strength and balance training. PT cues for body mechanics and optimal performance. WC transport back to room. Left seated with alarm intact and all needs within reach. ? ?Therapy Documentation ?Precautions:  ?Precautions ?Precautions: Posterior Hip, Shoulder,  Fall ?Shoulder Interventions: Shoulder sling/immobilizer, For comfort ?Precaution Booklet Issued: Yes (comment) ?Precaution Comments: NWB but OK for platform RW, AROM/PROM for shoulder extension/flexion, PROM shoulder abduction (NO ACTIVE ABDUCTION), pendulums OK ?Required Braces or Orthoses: Sling ?Restrictions ?Weight Bearing Restrictions: Yes ?LUE Weight Bearing: Non weight bearing ?LLE Weight Bearing: Weight bearing as tolerated ?Other Position/Activity Restrictions: posterior hip precautions ? ? ?Therapy/Group: Individual Therapy ? ?Breck Coons ?03/18/2022, 10:02 AM  ?

## 2022-03-18 NOTE — Progress Notes (Signed)
Occupational Therapy Session Note ? ?Patient Details  ?Name: Kevin [name] ?MRN: [id number] ?Date of Birth: May 11, 1946 ? ?Today's Date: 03/18/2022 ?OT Individual Time: 4166-0630 ?OT Individual Time Calculation (min): 73 min  ? ? ?Short Term Goals: ?Week 1:  OT Short Term Goal 1 (Week 1): STG= LTG d/t ELOS ? ?Skilled Therapeutic Interventions/Progress Updates:  ?  Pt greeted seated in recliner and agreeable to OT treatment session. OT educated on OT role and goals. Pt agreeable to shower today. Pt needed min A to stand from recliner with moderate cues to not push up with L UE. Platform RW to ambulate to the bathroom with min A. OT educated on posterior hip precautions when doffing clothing. OT issued LH sponge and educated on use to access lower body with lateral leans to wash buttocks. Pt needed min A to to get to standing in shower and cues not to push through L arm. OT educated on use of LH reacher and sock aid for lower body dressing. Pt had difficulty understanding technique and required mod A for LB ADLs. Pt ambulated back to recliner with platform RW and min A. Pt completed L UE there-ex with wrist flex/ext, elbow flex/ext, and shoulder forward flexion. Pt left seated in recliner with alarm belt on, spouse present, and needs met.  ? ?Therapy Documentation ?Precautions:  ?Precautions ?Precautions: Posterior Hip, Shoulder, Fall ?Shoulder Interventions: Shoulder sling/immobilizer, For comfort ?Precaution Booklet Issued: Yes (comment) ?Precaution Comments: NWB but OK for platform RW, AROM/PROM for shoulder extension/flexion, PROM shoulder abduction (NO ACTIVE ABDUCTION), pendulums OK ?Required Braces or Orthoses: Sling ?Restrictions ?Weight Bearing Restrictions: Yes ?LUE Weight Bearing: Non weight bearing ?LLE Weight Bearing: Weight bearing as tolerated ?Other Position/Activity Restrictions: posterior hip precautions ?Pain: ? Shoulder pain, no number given. Rest and repositioned for comfort. ? ? ?Therapy/Group:  Individual Therapy ? ?Daneen Schick Klaus Casteneda ?03/18/2022, 2:16 PM ?

## 2022-03-19 LAB — GLUCOSE, CAPILLARY
Glucose-Capillary: 145 mg/dL — ABNORMAL HIGH (ref 70–99)
Glucose-Capillary: 127 mg/dL — ABNORMAL HIGH (ref 70–99)
Glucose-Capillary: 175 mg/dL — ABNORMAL HIGH (ref 70–99)
Glucose-Capillary: 227 mg/dL — ABNORMAL HIGH (ref 70–99)

## 2022-03-19 MED ORDER — MEGESTROL ACETATE 400 MG/10ML PO SUSP
400.0000 mg | Freq: Every day | ORAL | Status: DC
  Administered 2022-03-19 – 2022-03-22 (×4): 400 mg via ORAL
  Filled 2022-03-19 (×4): qty 10

## 2022-03-19 NOTE — IPOC Note (Signed)
Overall Plan of Care (IPOC) ?Patient Details ?Name: Kevin [name] ?MRN: [id number] ?DOB: 1946-09-17 ? ?Admitting Diagnosis: Hip fracture requiring operative repair [facility]) ? ?Hospital Problems: Principal Problem: ?  Hip fracture requiring operative repair [facility]) ?Active Problems: ?  Urinary retention with incomplete bladder emptying ?  Fracture of neck of left humerus, closed, initial encounter ? ? ? ? Functional Problem List: ?Nursing Bladder, Edema, Endurance, Medication Management, Pain, Safety, Skin Integrity  ?PT Balance, Behavior, Endurance, Motor, Pain, Safety  ?OT Balance, Edema, Endurance, Motor, Pain, Safety, Skin Integrity  ?SLP    ?TR    ?    ? Basic ADL?s: ?OT Bathing, Dressing, Toileting  ? ?  Advanced  ADL?s: ?OT    ?   ?Transfers: ?PT Bed Mobility, Bed to Chair, Car, Furniture  ?OT Toilet, Tub/Shower  ? ?  Locomotion: ?PT Ambulation, Stairs  ? ?  Additional Impairments: ?OT None  ?SLP   ?  ?   ?TR    ? ? ?Anticipated Outcomes ?Item Anticipated Outcome  ?Self Feeding no goal set  ?Swallowing ?   ?  ?Basic self-care ? (S)  ?Toileting ? (S) ?  ?Bathroom Transfers (S)  ?Bowel/Bladder ? supervision  ?Transfers ? Supervision  ?Locomotion ? Supervision  ?Communication ?    ?Cognition ?    ?Pain ? < 3  ?Safety/Judgment ? supervision  ? ?Therapy Plan: ?PT Intensity: Minimum of 1-2 x/day ,45 to 90 minutes ?PT Frequency: 5 out of 7 days ?PT Duration Estimated Length of Stay: 7 Days ?OT Intensity: Minimum of 1-2 x/day, 45 to 90 minutes ?OT Frequency: 5 out of 7 days ?OT Duration/Estimated Length of Stay: 5-7 days ?  ? ? Team Interventions: ?Nursing Interventions Patient/Family Education, Bladder Management, Disease Management/Prevention, Pain Management, Medication Management, Skin Care/Wound Management, Discharge Planning  ?PT interventions Ambulation/gait training, Community reintegration, DME/adaptive equipment instruction, Neuromuscular re-education, Psychosocial support, Stair training, UE/LE Strength  taining/ROM, Training and development officer, Discharge planning, Functional electrical stimulation, Pain management, Skin care/wound management, Therapeutic Activities, UE/LE Coordination activities, Cognitive remediation/compensation, Disease management/prevention, Patient/family education, Functional mobility training, Splinting/orthotics, Therapeutic Exercise, Visual/perceptual remediation/compensation  ?OT Interventions Training and development officer, DME/adaptive equipment instruction, Patient/family education, Therapeutic Activities, Therapeutic Exercise, Cognitive remediation/compensation, Psychosocial support, Functional mobility training, Community reintegration, Self Care/advanced ADL retraining, UE/LE Strength taining/ROM, UE/LE Coordination activities, Discharge planning, Disease mangement/prevention, Pain management, Skin care/wound managment, Splinting/orthotics  ?SLP Interventions    ?TR Interventions    ?SW/CM Interventions Discharge Planning, Psychosocial Support, Patient/Family Education  ? ?Barriers to Discharge ?MD  Medical stability  ?Nursing Decreased caregiver support, Home environment access/layout, Incontinence, Wound Care, Lack of/limited family support, Weight bearing restrictions ?2 level, 2 steps, no rails. Bedroom/bathroom main level. Spouse can provide 24/7 care.  ?PT Decreased caregiver support, Home environment access/layout ?   ?OT   ?   ?SLP   ?   ?SW Decreased caregiver support, Lack of/limited family support ?   ? ?Team Discharge Planning: ?Destination: PT-Home ,OT- Home , SLP-  ?Projected Follow-up: PT-Home health PT, 24 hour supervision/assistance, OT-  Home health OT, SLP-  ?Projected Equipment Needs: PT-To be determined, OT- To be determined, SLP-  ?Equipment Details: PT- , OT-  ?Patient/family involved in discharge planning: PT- Patient, Family member/caregiver,  OT-Patient, SLP-  ? ?MD ELOS: 7 days ?Medical Rehab Prognosis:  Excellent ?Assessment: The patient has been admitted  for CIR therapies with the diagnosis of left femoral neck and left humeral fx's. The team will be addressing functional mobility, strength, stamina, balance, safety, adaptive techniques and equipment,  self-care, bowel and bladder mgt, patient and caregiver education, pain mgt, ortho precautions, community reentry. Goals have been set at supervision for mobility and self-care. Anticipated discharge destination is home with wife. ? ? ?   ? ? ?See Team Conference Notes for weekly updates to the plan of care  ?

## 2022-03-19 NOTE — Progress Notes (Signed)
Physical Therapy Session Note ? ?Patient Details  ?Name: Kevin [name] ?MRN: [id number] ?Date of Birth: 03/14/1946 ? ?Today's Date: 03/19/2022 ?PT Individual Time: 8309-4076 ?PT Individual Time Calculation (min): 30 min  ? ?Short Term Goals: ?Week 1:  PT Short Term Goal 1 (Week 1): STGs = LTGs due to ELOS ? ?Skilled Therapeutic Interventions/Progress Updates: Pt presents sitting in recliner w/ LEs elevated.  Pt agreeable to therapy.  Pt transferred sit to stand w/ min A and verbal cues for THR precautions.  Pt amb multiple trials w/ L platform walker and CGA > 300' each trial.  Pt requires verbal cues for posture, BOS and maintaining slight ER of LLE.  Pt required verbal cues for L LE placement w/ transfers sit <> stand from lower chair height.  Pt returned to recliner and remained sitting, LEs elevated, seat alarm on and all needs in reach. ?   ? ?Therapy Documentation ?Precautions:  ?Precautions ?Precautions: Posterior Hip, Shoulder, Fall ?Shoulder Interventions: Shoulder sling/immobilizer, For comfort ?Precaution Booklet Issued: Yes (comment) ?Precaution Comments: NWB but OK for platform RW, AROM/PROM for shoulder extension/flexion, PROM shoulder abduction (NO ACTIVE ABDUCTION), pendulums OK ?Required Braces or Orthoses: Sling ?Restrictions ?Weight Bearing Restrictions: Yes ?LUE Weight Bearing: Non weight bearing ?LLE Weight Bearing: Weight bearing as tolerated ?Other Position/Activity Restrictions: posterior hip precautions ?General: ?  ?Vital Signs: ?  ?Pain:0/10 ?Pain Assessment ?Pain Scale: 0-10 ?Pain Score: 5  ?Pain Type: Surgical pain ?Pain Location: Shoulder ? ? ? ? ?Therapy/Group: Individual Therapy ? ?Ladoris Gene ?03/19/2022, 12:16 PM  ?

## 2022-03-19 NOTE — Progress Notes (Signed)
Occupational Therapy Session Note ? ?Patient Details  ?Name: Kevin [name] ?MRN: [id number] ?Date of Birth: [birth year] ? ?Today's Date: 03/19/2022 ?Session 1 ?OT Individual Time: 5400-8676 ?OT Individual Time Calculation (min): 73 min  ? ?Session 2 ?OT Individual Time: 1350-1503 ?OT Individual Time Calculation (min): 73 min  ? ? ?Short Term Goals: ?Week 1:  OT Short Term Goal 1 (Week 1): STG= LTG d/t ELOS ? ?Skilled Therapeutic Interventions/Progress Updates:  ?  Pt greeted seated in wc and agreeable to OT treatment session. Pt declined need to go to the bathroom for perform and BADL tasks. Pt brought to therapy gym in wc. L UE there-ex while adhering to NO ABDUCTION. 3 sets of 10 elbow flex/ext, shoulder forward flexion, wrist flex/ext. Active assist from OT to achieve 90 degrees of shoulder FF. OT issued soft theraputty and went through hand exercises. Table walking with L UE for shoulder ROM. OT placed kinesiotape on L forearm and elbow for edema management. Continued working on functional use of L UE and problem solving with graded peg board task. Pt with difficulty oriented pegs in horizontal plane. OT graded task to simpler 2 color linear pattern with improved comprehension. Pt reported onset of dizziness. BP taken at 117/68 in sitting. Pt returned to room and felt dizziness had improved.  Functional ambulation in room with platform RW, CGA, but min A to get to standing. Pt left seated in recliner with alarm pad on, call bell in reach, and needs met.  ?Pain: ? Rest and repositioned for comfort ? ?Session 2 ?Pt greeted asleep in recliner with spouse present, pt easy to wake and agreeable to OT treatment session focused on self-care retraining at shower level. Pt doffed clothing seated in recliner with min A for UB and max A for LB due to not having a reacher. He needed min A to stand from deep recliner. Pt then ambulated to the bathroom with PFRW and CGA. Min A to sit safely down onto tub bench. Bathing  completed with cues for safety to use lateral leans to wash buttocks and cues to use LH sponge to wash lower legs. CGA to stand from shower using grab bars and CGA to transfer to wc. Blocked practice for dressing tasks using long handled AE. Pt with poor frustration tolerance with novel task as he had no carryover from using AE yesterday. Educated on having patients with himself when he does not get something the first time. Pt brought to therapy gym and completed 3 sets of 10 seated knee extension, ankle pumps, and partial sit<>stand stand squats. Pt returned to room and ambulated in room to recliner with CGA and PFRW. PT left seated in recliner with alarm pad on, wife present, and needs met.  ?L shoulder pain 6/10- rest and repositioned for comfort. ? ? ?Therapy Documentation ?Precautions:  ?Precautions ?Precautions: Posterior Hip, Shoulder, Fall ?Shoulder Interventions: Shoulder sling/immobilizer, For comfort ?Precaution Booklet Issued: Yes (comment) ?Precaution Comments: NWB but OK for platform RW, AROM/PROM for shoulder extension/flexion, PROM shoulder abduction (NO ACTIVE ABDUCTION), pendulums OK ?Required Braces or Orthoses: Sling ?Restrictions ?Weight Bearing Restrictions: Yes ?LUE Weight Bearing: Non weight bearing ?LLE Weight Bearing: Weight bearing as tolerated ?Other Position/Activity Restrictions: posterior hip precautions ?Pain: ? Rest and repositioned for comfort ? ?Therapy/Group: Individual Therapy ? ?Daneen Schick Tanga Gloor ?03/19/2022, 3:14 PM ?

## 2022-03-19 NOTE — Progress Notes (Signed)
Physical Therapy Session Note ? ?Patient Details  ?Name: Kevin [name] ?MRN: [id number] ?Date of Birth: 1946-09-23 ? ?Today's Date: 03/19/2022 ?PT Individual Time: [phone] ?PT Individual Time Calculation (min): 57 min  ? ?Short Term Goals: ?Week 1:  PT Short Term Goal 1 (Week 1): STGs = LTGs due to ELOS ? ?Skilled Therapeutic Interventions/Progress Updates:  ?   ?PT received supine in bed and agrees to therapy. Reports "not doing too well" due to pain in L shoulder and "needing to tinkle". PT provides education on limiting movement and use of L upper extremity to maintain integrity of shoulder fracture and manage pain. Pt performs supine to sit with modA and cues for sequencing and positioning. Sit to stand from slightly elevated EOB with minA and cues for hand placement and facilitation of anterior weight shift. Pt ambulates to toilet with platform RW and transfers to elevated toilet seat with minA and cues for positioning and hand placement. Pt is unable to void in sitting so stands with minA and remains standing with CGA to urinate in standing. RN notified of successful voiding. Pt stands to don pants with CGA. WC transport outside for gait training over unlevel and varying surfaces. Initially pt performs 2x10 LAQs to warm-up lower extremity muscle groups. Sit to stand to platform RW with minA and cues for hand placement and anterior trunk lean. Pt ambulates x300' with CGA and cues for upright gaze to improve posture and balance, increasing R stride length to promote longer stance time on L, and maintaining wider stance width for larger BOS and stability. Cues as well to limit internal rotation of L lower extremity. WC transport back inside. Pt left seated with alarm intact and all needs within reach. ? ?Therapy Documentation ?Precautions:  ?Precautions ?Precautions: Posterior Hip, Shoulder, Fall ?Shoulder Interventions: Shoulder sling/immobilizer, For comfort ?Precaution Booklet Issued: Yes  (comment) ?Precaution Comments: NWB but OK for platform RW, AROM/PROM for shoulder extension/flexion, PROM shoulder abduction (NO ACTIVE ABDUCTION), pendulums OK ?Required Braces or Orthoses: Sling ?Restrictions ?Weight Bearing Restrictions: Yes ?LUE Weight Bearing: Non weight bearing ?LLE Weight Bearing: Weight bearing as tolerated ?Other Position/Activity Restrictions: posterior hip precautions ? ? ? ?Therapy/Group: Individual Therapy ? ?Breck Coons, PT, DPT ?03/19/2022, 5:12 PM  ?

## 2022-03-19 NOTE — Progress Notes (Signed)
Inpatient Rehabilitation Care Coordinator ?Assessment and Plan ?Patient Details  ?Name: Kevin [name] ?MRN: [id number] ?Date of Birth: May 31, 1946 ? ?Today's Date: 03/19/2022 ? ?Hospital Problems: Principal Problem: ?  Hip fracture requiring operative repair [facility]) ?Active Problems: ?  Urinary retention with incomplete bladder emptying ?  Fracture of neck of left humerus, closed, initial encounter ? ?Past Medical History:  ?Past Medical History:  ?Diagnosis Date  ? Agent orange exposure   ? in [place]  ? Anemia   ? Dementia ([place])   ? DNI (do not intubate) 01/21/2022  ? DNR (do not resuscitate) 01/21/2022  ? Heart block AV second degree   ? a. s/p PPM implant with subsequent CRTD upgrade  ? High cholesterol   ? History of colon polyps   ? LBBB (left bundle branch block)   ? Nonischemic cardiomyopathy ([place])   ? a. MDT CRTD upgrade 2016  ? Pacemaker 08/27/2013  ? Dual-chamber Medtronic Adapta implanted January 2013 for bradycardia with alternating bundle branch block and 2:1 atrioventricular block   ? Prostate cancer ([place])   ? "not been treated yet; on a wait and see" (01/22/2015)  ? Type II diabetes mellitus ([place])   ? ?Past Surgical History:  ?Past Surgical History:  ?Procedure Laterality Date  ? BASAL CELL CARCINOMA EXCISION    ? "back, face, neck"  ? BI-VENTRICULAR IMPLANTABLE CARDIOVERTER DEFIBRILLATOR UPGRADE N/A 01/22/2015  ? MDT CRTD upgrade by Dr Rayann Heman  ? BIV ICD GENERATOR CHANGEOUT N/A 08/28/2020  ? Procedure: BIV ICD GENERATOR CHANGEOUT;  Surgeon: [name], MD;  Location: [place] [facility];  Service: Cardiovascular;  Laterality: N/A;  ? CARDIAC CATHETERIZATION  01/27/2012  ? normal coronaries, dilated LV c/w nonischemic cardiomyopathy  ? EXAMINATION UNDER ANESTHESIA  04/06/2012  ? Procedure: EXAM UNDER ANESTHESIA;  Surgeon: Joyice Faster. Cornett, MD;  Location: [place];  Service: General;  Laterality: N/A;  ? HIP ARTHROPLASTY Left 03/10/2022  ? Procedure: ARTHROPLASTY BIPOLAR HIP (HEMIARTHROPLASTY);  Surgeon:  Willaim Sheng, MD;  Location: [place];  Service: Orthopedics;  Laterality: Left;  ? INCISION AND DRAINAGE PERIRECTAL ABSCESS  ~ 2010; 2015  ? INGUINAL HERNIA REPAIR Left 1980  ? IR ANGIOGRAM PELVIS SELECTIVE OR SUPRASELECTIVE  10/20/2021  ? IR ANGIOGRAM VISCERAL SELECTIVE  10/20/2021  ? IR EMBO ART  VEN HEMORR LYMPH EXTRAV  INC GUIDE ROADMAPPING  10/20/2021  ? IR US GUIDE VASC ACCESS LEFT  10/20/2021  ? LEFT HEART CATHETERIZATION WITH CORONARY ANGIOGRAM N/A 01/27/2012  ? Procedure: LEFT HEART CATHETERIZATION WITH CORONARY ANGIOGRAM;  Surgeon: Troy Sine, MD;  Location[facility];  Service: Cardiovascular;  Laterality: N/A;  ? NM MYOCAR PERF WALL MOTION  01/21/2012  ? mod-severe perfusion defect due to infarct/scar w/mild perinfarct ischemia in the apical,basal inferoseptal,basal inferior,mid inferoseptal,midinferior and apical inferior regions  ? ORIF SHOULDER FRACTURE Left 03/11/2022  ? Procedure: OPEN REDUCTION INTERNAL FIXATION (ORIF) SHOULDER FRACTURE;  Surgeon: Altamese [place], MD;  Location: [place];  Service: Orthopedics;  Laterality: Left;  ? pacemaker battery    ? PERMANENT PACEMAKER INSERTION N/A 12/01/2011  ? Medtronic implanted by Dr Sallyanne Kuster  ? PROSTATE BIOPSY    ? PROSTATE BIOPSY    ? shrapnel surgery    ? "in [place]"  ? TONSILLECTOMY    ? ?Social History:  reports that he quit smoking about 37 years ago. His smoking use included cigarettes. He has a 20.00 pack-year smoking history. He has never used smokeless tobacco. He reports current alcohol use. He reports that  he does not use drugs. ? ?Family / Support Systems ?Marital Status: Married ?Patient Roles: Spouse, Parent ?Spouse/Significant Other: [name] (wife) ?Children: Adult dtr- [name] ?Other Supports: None reported ?Anticipated Caregiver: Wife ?Ability/Limitations of Caregiver: None reported; wife recently quit working to make sure she can provide care to patient ?Caregiver Availability: 24/7 ?Family Dynamics: Lives with his wife ? ?Social  History ?Preferred language: English ?Religion: Methodist ?Education: college grad ?Health Literacy - How often do you need to have someone help you when you read instructions, pamphlets, or other written material from your doctor or pharmacy?: Rarely ?Writes: Yes ?Employment Status: Retired ?Legal History/Current Legal Issues: Denies ?Guardian/Conservator: N/A  ? ?Abuse/Neglect ?Abuse/Neglect Assessment Can Be Completed: Yes ?Physical Abuse: Denies ?Verbal Abuse: Denies ?Sexual Abuse: Denies ?Exploitation of patient/patient's resources: Denies ?Self-Neglect: Denies ? ?Patient response to: ?Social Isolation - How often do you feel lonely or isolated from those around you?: Never ? ?Emotional Status ?Pt's affect, behavior and adjustment status: Pt in good spirits at time of visit. Eager to get back home. ?Recent Psychosocial Issues: Denie ?Psychiatric History: Denies ?Substance Abuse History: reports he quit smoking cigarettes 37 yrs ago; 20 yr smoking hx. Denies alochol or rec drug use. ? ?Patient / Family Perceptions, Expectations & Goals ?Pt/Family understanding of illness & functional limitations: Pt and family have a general understanding of pt care needs ?Premorbid pt/family roles/activities: Independent ?Anticipated changes in roles/activities/participation: Assistance with ADLs/IADLs ?Pt/family expectations/goals: Pt goal is to get back to physical, and work on his knees and now feeling better than before. ? ?[employer] ?Community Agencies: None ?Premorbid Home Care/DME Agencies: Other (Comment) ([place]) ?Transportation available at discharge: wife ?Is the patient able to respond to transportation needs?: Yes ?In the past 12 months, has lack of transportation kept you from medical appointments or from getting medications?: No ?In the past 12 months, has lack of transportation kept you from meetings, work, or from getting things needed for daily living?: No ? ?Discharge Planning ?Living  Arrangements: Spouse/significant other ?Support Systems: Spouse/significant other ?Type of Residence: Private residence ?Insurance Resources: Multimedia programmer (specify) ([facility]) ?Financial Resources: Social Security ?Financial Screen Referred: No ?Living Expenses: Own ?Money Management: Spouse ?Does the patient have any problems obtaining your medications?: No ?Home Management: Pt wife manages home care needs ?Patient/Family Preliminary Plans: No Changes ?Care Coordinator Barriers to Discharge: Decreased caregiver support, Lack of/limited family support ?Care Coordinator Anticipated Follow Up Needs: HH/OP ?Expected length of stay: 4-7 days ? ?Clinical Impression ?SW familiar to pt from previous admission. Pt is pleasant and calm. He is eager to get home and happy about therapy as he states his knees are doing better now than before. Pt is a English as a second language teacher. DME: BSC, RW, rollator, and shower chair. He mentioned the [facility] is supposed to assist with remodeling the bathroom and waiting on this to happen.  ? ?Rana Snare ?03/19/2022, 2:58 PM ? ?  ?

## 2022-03-19 NOTE — Progress Notes (Signed)
?                                                       PROGRESS NOTE ? ? ?Subjective/Complaints: ?Pt without new complaints. Nurse documents that wife is concerned about his appetite.  ? ?ROS: Patient denies fever, rash, sore throat, blurred vision, dizziness, nausea, vomiting, diarrhea, cough, shortness of breath or chest pain, back/neck pain, headache, or mood change.  ? ?Objective: ?  ?No results found. ?Recent Labs  ?  03/17/22 ?8841  ?WBC 8.4  ?HGB 9.1*  ?HCT 27.9*  ?PLT 199  ? ?Recent Labs  ?  03/17/22 ?6606  ?NA 137  ?K 3.9  ?CL 108  ?CO2 24  ?GLUCOSE 118*  ?BUN 33*  ?CREATININE 1.31*  ?CALCIUM 8.5*  ? ? ?Intake/Output Summary (Last 24 hours) at 03/19/2022 0956 ?Last data filed at 03/19/2022 3016 ?Gross per 24 hour  ?Intake 478 ml  ?Output --  ?Net 478 ml  ?  ? ?  ? ?Physical Exam: ?Vital Signs ?Blood pressure 117/76, pulse 87, temperature 98.5 ?F (36.9 ?C), temperature source Oral, resp. rate 18, height '6\' 4"'$  (1.93 m), weight 64.3 kg, SpO2 98 %. ? ?Constitutional: No distress . Vital signs reviewed. ?HEENT: NCAT, EOMI, oral membranes moist ?Neck: supple ?Cardiovascular: RRR without murmur. No JVD    ?Respiratory/Chest: CTA Bilaterally without wheezes or rales. Normal effort    ?GI/Abdomen: BS +, non-tender, non-distended ?Ext: no clubbing, cyanosis, or edema ?Psych: pleasant and cooperative   ?Skin: a few scattered bruises. Left hip wound, shoulder wounds dressed/clean with post-op dressings/foam intact.  ?Neuro:  Alert and oriented x 3. Normal insight and awareness. Intact Memory. Normal language and speech. Cranial nerve exam unremarkable  ?Musculoskeletal: left thigh still sl swollen ? ? ?Assessment/Plan: ?1. Functional deficits which require 3+ hours per day of interdisciplinary therapy in a comprehensive inpatient rehab setting. ?Physiatrist is providing close team supervision and 24 hour management of active medical problems listed below. ?Physiatrist and rehab team continue to assess barriers to  discharge/monitor patient progress toward functional and medical goals ? ?Care Tool: ? ?Bathing ?   ?Body parts bathed by patient: Right arm, Left arm, Chest, Abdomen, Front perineal area, Buttocks, Right upper leg, Left upper leg, Face  ? Body parts bathed by helper: Right lower leg, Left lower leg ?  ?  ?Bathing assist Assist Level: Minimal Assistance - Patient > 75% (Per OT eval note) ?  ?  ?Upper Body Dressing/Undressing ?Upper body dressing   ?  ?   ?Upper body assist Assist Level: Minimal Assistance - Patient > 75% ?   ?Lower Body Dressing/Undressing ?Lower body dressing ? ? ?   ?What is the patient wearing?: Pants ? ?  ? ?Lower body assist Assist for lower body dressing: Moderate Assistance - Patient 50 - 74% ?   ? ?Toileting ?Toileting    ?Toileting assist Assist for toileting: Moderate Assistance - Patient 50 - 74% ?  ?  ?Transfers ?Chair/bed transfer ? ?Transfers assist ?   ? ?Chair/bed transfer assist level: Moderate Assistance - Patient 50 - 74% ?  ?  ?Locomotion ?Ambulation ? ? ?Ambulation assist ? ?   ? ?Assist level: Minimal Assistance - Patient > 75% ?Assistive device: Walker-platform ?Max distance: 200'  ? ?Walk 10 feet activity ? ? ?Assist ?   ? ?  Assist level: Minimal Assistance - Patient > 75% ?Assistive device: Walker-platform  ? ?Walk 50 feet activity ? ? ?Assist   ? ?Assist level: Minimal Assistance - Patient > 75% ?Assistive device: Walker-platform  ? ? ?Walk 150 feet activity ? ? ?Assist   ? ?Assist level: Minimal Assistance - Patient > 75% ?Assistive device: Walker-platform ?  ? ?Walk 10 feet on uneven surface  ?activity ? ? ?Assist   ? ? ?Assist level: Minimal Assistance - Patient > 75% ?Assistive device: Walker-platform  ? ?Wheelchair ? ? ? ? ?Assist Is the patient using a wheelchair?: No ?  ?  ? ?  ?   ? ? ?Wheelchair 50 feet with 2 turns activity ? ? ? ?Assist ? ?  ?  ? ? ?   ? ?Wheelchair 150 feet activity  ? ? ? ?Assist ?   ? ? ?   ? ?Blood pressure 117/76, pulse 87, temperature 98.5  ?F (36.9 ?C), temperature source Oral, resp. rate 18, height '6\' 4"'$  (1.93 m), weight 64.3 kg, SpO2 98 %. ? ?Medical Problem List and Plan: ?1. Functional deficits secondary to fall with L femoral neck fracture- and L humeral fx-  ?            -patient may  shower- cover L incision ?            -ELOS/Goals: 5-7 days mod I to supervision ? -Continue CIR therapies including PT, OT  ?2.  Antithrombotics: ?-DVT/anticoagulation:  Pharmaceutical: Lovenox X 4 weeks started  ?            -antiplatelet therapy:  Plavix ?3. Pain Management: Hydrocodone prn.  ?4. Mood: LCSW to follow for evaluation and support.  ?            -antipsychotic agents: N/A ?5. Neuropsych: This patient is not fully  capable of making decisions on his own behalf. ?6. Skin/Wound Care: Routine pressure relief measures ?            --Left hip dressing to stay in place for 2 weeks--remains clean ?            --Left shoulder foam dressings changed every 1-2 days.  ?7. Fluids/Electrolytes/Nutrition: pt is eating 10-100% of meals.  ? -will add daily megace per wife's request ?8. Left proximal Humerus Fx s/p ORIF- NWB LUE with platform walker per [name] ?            --passive and active shoulder flex/ext, passive (NO ACTIVE) shoulder abduction ?            --sling prn and unrestricted ROM L-elbow, forearm, wrist, hand ?9. Left femoral neck Fx s/p hemi: WBAT LLE with posterior hip precautions ?            --Aquacel dressing intact till follow up appt 2 weeks w/Dr. Yetta Numbers ?10. Thrombocytopenia: resolved ?11. ABLA: Stable post transfusion. Continue to monitor H/H. ?            --5/10 hgb 9.1. stable ?12. Acute on chronic renal failure: BUN/SCR 17/1.34 at admission-->31/1.38. ?            --encourage fluid intake.  ? - bun/cr in line with prior labs--recheck Monday 5/15 ?13. Depression/Insomnia: Managed with Wellbutrin and Remeron.  ?14.  Dementia: Stable on Namenda and Aricept.  ? -observe for carryover with therapies.  ?15. T2DM: Monitor BS ac/hs. Use SSI  for elevated BS ?            --added juven/prostat  as nutritional supplement.  ? CBG (last 3)  ?Recent Labs  ?  03/18/22 ?1605 03/18/22 ?2108 03/19/22 ?0628  ?GLUCAP 177* 113* 145*  ?  5/12 cbg's controlled ?16. Urinary retention: Has prostate cancer with mets? ?            --foley out.   voiding trial--successful, low residuals ?17. Bowels: moving bowels ? ?LOS: ?3 days ?A FACE TO FACE EVALUATION WAS PERFORMED ? ?Meredith Staggers ?03/19/2022, 9:56 AM  ? ?  ?

## 2022-03-20 DIAGNOSIS — S42212A Unspecified displaced fracture of surgical neck of left humerus, initial encounter for closed fracture: Secondary | ICD-10-CM

## 2022-03-20 DIAGNOSIS — F32A Depression, unspecified: Secondary | ICD-10-CM

## 2022-03-20 DIAGNOSIS — M79603 Pain in arm, unspecified: Secondary | ICD-10-CM

## 2022-03-20 LAB — GLUCOSE, CAPILLARY
Glucose-Capillary: 126 mg/dL — ABNORMAL HIGH (ref 70–99)
Glucose-Capillary: 109 mg/dL — ABNORMAL HIGH (ref 70–99)
Glucose-Capillary: 165 mg/dL — ABNORMAL HIGH (ref 70–99)
Glucose-Capillary: 135 mg/dL — ABNORMAL HIGH (ref 70–99)

## 2022-03-20 NOTE — Progress Notes (Signed)
Occupational Therapy Session Note ? ?Patient Details  ?Name: Kevin [name] ?MRN: [id number] ?Date of Birth: 1946-10-02 ? ?Today's Date: 03/20/2022 ?OT Individual Time: 0800-0900 ?OT Individual Time Calculation (min): 60 min  ? ? ?Short Term Goals: ?Week 1:  OT Short Term Goal 1 (Week 1): STG= LTG d/t ELOS ? ?Skilled Therapeutic Interventions/Progress Updates:  ?   ?Pt received in bed with unrated shoulder and hip pain. Rest and repositioning provided for pain relief ?ADL: ?Pt completes ADL at overall min-MOD Level. Skilled interventions include: VC for managing frustration tolerance, AE training, hemi strategy for LB dressing, cuing for precaution adherence and encouragement to improve functional independence. Pt completes all functional mobility with MIN A and cuing to decrease posterior bias. Pt able to verbalize feeling retropulsion but demo difficulty fixing. Pt continues to require A for threading LLE into pants using reacher, but pt able to fasten in standing with BUE. Pt requreis A to thread belt in standing and edu re putting belt around pants prior to threading Les into pants to improve independence. Pt able to use sock aide with MIN A to don sock and use shoe funnel with MOD A to don shoes. Amb transfers back to recliner with MIN A.  ? ?Pt left at end of session in bed with exit alarm on, call light in reach and all needs met ? ? ?Therapy Documentation ?Precautions:  ?Precautions ?Precautions: Posterior Hip, Shoulder, Fall ?Shoulder Interventions: Shoulder sling/immobilizer, For comfort ?Precaution Booklet Issued: Yes (comment) ?Precaution Comments: NWB but OK for platform RW, AROM/PROM for shoulder extension/flexion, PROM shoulder abduction (NO ACTIVE ABDUCTION), pendulums OK ?Required Braces or Orthoses: Sling ?Restrictions ?Weight Bearing Restrictions: Yes ?LUE Weight Bearing: Non weight bearing ?LLE Weight Bearing: Weight bearing as tolerated ?Other Position/Activity Restrictions: posterior hip  precautions ?General: ?  ? ?Therapy/Group: Individual Therapy ? ?Lowella Dell Athalie Newhard ?03/20/2022, 6:55 AM ?

## 2022-03-20 NOTE — Progress Notes (Signed)
Physical Therapy Session Note ? ?Patient Details  ?Name: Kevin [name] ?MRN: [id number] ?Date of Birth: 1946-08-23 ? ?Today's Date: 03/20/2022 ?PT Individual Time: 1700-1749 ?PT Individual Time Calculation (min): 55 min  ? ?Short Term Goals: ?Week 1:  PT Short Term Goal 1 (Week 1): STGs = LTGs due to ELOS ? ?Skilled Therapeutic Interventions/Progress Updates:  ?  Pt presents in bed and reporting he is eager to go home. Reviewed process for setting d/c date and clearance needed from MD. Pt asking if participating in therapies will let him leave tomorrow - reminded patient that participating will help improve his strength, mobility, endurance and prepare for getting ready for home but do not see anything anticipating that he is set to d/c tomorrow. CGA for bed mobility to come to EOB with cues for technique for adherence to precautions of LUE - despite multiple cues, pt still breaking precautions at times during session. Min assist for sit > stand initially requiring facilitation for anterior weightshift and cues for hand placement/technique. In gym, worked on dynamic gait through obstacle course navigating cones and stepping over objects to simulate thresholds or uneven surfaces - difficulty noted with carryover of cues for sequencing RW and foot placement x 50'. Attempted again with goal to work on lateral sidestepping to simulate narrow doorways/pathways in home environment but pt difficulty following this pattern and reverted back to just navigating through cones. Dynamic standing balance activity while on compliant surface to pitch horseshoes with CGA overall for balance and occasional min assist due to posterior lean. Pt kept asking if he could return to room due to his minister waiting for him but reminded him that this is his therapy time to work on getting stronger. Pt agreeable to finish session. Needed to use bathroom so ended up returning back to room anyway and performed toileting seated and then  attempted standing for urination but pt unsuccessful - min assist for dynamic balance for turning and for clothing management. End of session request to return back to bed - performed with CGA and cues for technique to maintain posterior hip precautions. Repositioned with all needs in reach and minister at bedside.  ? ?Therapy Documentation ?Precautions:  ?Precautions ?Precautions: Posterior Hip, Shoulder, Fall ?Shoulder Interventions: Shoulder sling/immobilizer, For comfort ?Precaution Booklet Issued: Yes (comment) ?Precaution Comments: NWB but OK for platform RW, AROM/PROM for shoulder extension/flexion, PROM shoulder abduction (NO ACTIVE ABDUCTION), pendulums OK ?Required Braces or Orthoses: Sling ?Restrictions ?Weight Bearing Restrictions: Yes ?LUE Weight Bearing: Non weight bearing ?LLE Weight Bearing: Weight bearing as tolerated ?Other Position/Activity Restrictions: posterior hip precautions ? ?Pain: ? Reports pain in LUE but does not rate. States he received medication.  ? ? ?Therapy/Group: Individual Therapy ? ?[name] ?[name], PT, DPT, CBIS ? ?03/20/2022, 2:05 PM  ?

## 2022-03-20 NOTE — Progress Notes (Signed)
?                                                       PROGRESS NOTE ? ? ?Subjective/Complaints: ?No complaints or concerns this AM. ? ?ROS: No fever, chills, SOB, CP, Abdominal pain ? ?Objective: ?  ?No results found. ?No results for input(s): WBC, HGB, HCT, PLT in the last 72 hours. ? ?No results for input(s): NA, K, CL, CO2, GLUCOSE, BUN, CREATININE, CALCIUM in the last 72 hours. ? ? ?Intake/Output Summary (Last 24 hours) at 03/20/2022 1209 ?Last data filed at 03/20/2022 0800 ?Gross per 24 hour  ?Intake 437 ml  ?Output 750 ml  ?Net -313 ml  ? ?  ? ?  ? ?Physical Exam: ?Vital Signs ?Blood pressure (!) 110/52, pulse 89, temperature 98.8 ?F (37.1 ?C), temperature source Oral, resp. rate 18, height '6\' 4"'$  (1.93 m), weight 64.3 kg, SpO2 100 %. ? ?Constitutional: No distress . Vital signs reviewed. In bed ?HEENT: NCAT, EOMI, oral membranes moist, conjugate gaze ?Neck: supple ?Cardiovascular: RRR without murmur.  ?Respiratory/Chest: CTA Bilaterally without wheezes or rales. Non-labored breathing ?GI/Abdomen: BS +, non-tender, non-distended ?Ext: no clubbing, cyanosis, or edema ?Psych: pleasant and cooperative   ?Skin: a few scattered bruises. Left hip wound, shoulder wounds dressed/clean with post-op dressings/foam intact.  ?Neuro:  Alert and oriented x 3. Normal insight and awareness. Intact Memory. Normal language and speech. Cranial nerve exam unremarkable  ?Musculoskeletal: left thigh still sl swollen ? ? ?Assessment/Plan: ?1. Functional deficits which require 3+ hours per day of interdisciplinary therapy in a comprehensive inpatient rehab setting. ?Physiatrist is providing close team supervision and 24 hour management of active medical problems listed below. ?Physiatrist and rehab team continue to assess barriers to discharge/monitor patient progress toward functional and medical goals ? ?Care Tool: ? ?Bathing ?   ?Body parts bathed by patient: Right arm, Left arm, Chest, Abdomen, Front perineal area, Buttocks,  Right upper leg, Left upper leg, Face  ? Body parts bathed by helper: Right lower leg, Left lower leg ?  ?  ?Bathing assist Assist Level: Minimal Assistance - Patient > 75% (Per OT eval note) ?  ?  ?Upper Body Dressing/Undressing ?Upper body dressing   ?  ?   ?Upper body assist Assist Level: Minimal Assistance - Patient > 75% ?   ?Lower Body Dressing/Undressing ?Lower body dressing ? ? ?   ?What is the patient wearing?: Pants ? ?  ? ?Lower body assist Assist for lower body dressing: Moderate Assistance - Patient 50 - 74% ?   ? ?Toileting ?Toileting    ?Toileting assist Assist for toileting: Moderate Assistance - Patient 50 - 74% ?  ?  ?Transfers ?Chair/bed transfer ? ?Transfers assist ?   ? ?Chair/bed transfer assist level: Contact Guard/Touching assist ?  ?  ?Locomotion ?Ambulation ? ? ?Ambulation assist ? ?   ? ?Assist level: Contact Guard/Touching assist ?Assistive device: Walker-platform ?Max distance: 150  ? ?Walk 10 feet activity ? ? ?Assist ?   ? ?Assist level: Contact Guard/Touching assist ?Assistive device: Walker-rolling, Walker-platform  ? ?Walk 50 feet activity ? ? ?Assist   ? ?Assist level: Contact Guard/Touching assist ?Assistive device: Walker-platform  ? ? ?Walk 150 feet activity ? ? ?Assist   ? ?Assist level: Contact Guard/Touching assist ?Assistive device: Walker-platform ?  ? ?Walk 10 feet  on uneven surface  ?activity ? ? ?Assist   ? ? ?Assist level: Minimal Assistance - Patient > 75% ?Assistive device: Walker-platform  ? ?Wheelchair ? ? ? ? ?Assist Is the patient using a wheelchair?: Yes ?Type of Wheelchair: Manual ?  ? ?Wheelchair assist level: Supervision/Verbal cueing ?Max wheelchair distance: 40  ? ? ?Wheelchair 50 feet with 2 turns activity ? ? ? ?Assist ? ?  ?  ? ? ?Assist Level: Supervision/Verbal cueing  ? ?Wheelchair 150 feet activity  ? ? ? ?Assist ?   ? ? ?   ? ?Blood pressure (!) 110/52, pulse 89, temperature 98.8 ?F (37.1 ?C), temperature source Oral, resp. rate 18, height '6\' 4"'$   (1.93 m), weight 64.3 kg, SpO2 100 %. ? ?Medical Problem List and Plan: ?1. Functional deficits secondary to fall with L femoral neck fracture- and L humeral fx-  ?            -patient may  shower- cover L incision ?            -ELOS/Goals: 5-7 days mod I to supervision ? -Continue CIR therapies including PT, OT  ?2.  Antithrombotics: ?-DVT/anticoagulation:  Pharmaceutical: Lovenox X 4 weeks started  ?            -antiplatelet therapy:  Plavix ?3. Pain Management: Hydrocodone prn.  ? -5/13 Pain well controlled, continues to use hydrocodone, continue current medications ?4. Mood: LCSW to follow for evaluation and support.  ?            -antipsychotic agents: N/A ?5. Neuropsych: This patient is not fully  capable of making decisions on his own behalf. ?6. Skin/Wound Care: Routine pressure relief measures ?            --Left hip dressing to stay in place for 2 weeks--remains clean ?            --Left shoulder foam dressings changed every 1-2 days.  ?7. Fluids/Electrolytes/Nutrition: pt is eating 10-100% of meals.  ? -will add daily megace per wife's request ?8. Left proximal Humerus Fx s/p ORIF- NWB LUE with platform walker per [name] ?            --passive and active shoulder flex/ext, passive (NO ACTIVE) shoulder abduction ?            --sling prn and unrestricted ROM L-elbow, forearm, wrist, hand ?9. Left femoral neck Fx s/p hemi: WBAT LLE with posterior hip precautions ?            --Aquacel dressing intact till follow up appt 2 weeks w/Dr. Yetta Numbers ?10. Thrombocytopenia: resolved ?11. ABLA: Stable post transfusion. Continue to monitor H/H. ?            --5/10 hgb 9.1. stable ?12. Acute on chronic renal failure: BUN/SCR 17/1.34 at admission-->31/1.38. ?            --encourage fluid intake.  ? - bun/cr in line with prior labs--recheck Monday 5/15 ?13. Depression/Insomnia: Managed with Wellbutrin and Remeron.  ? -5/13 Reports mood good overall today ?14.  Dementia: Stable on Namenda and Aricept.  ? -observe for  carryover with therapies.  ?15. T2DM: Monitor BS ac/hs. Use SSI for elevated BS ?            --added juven/prostat as nutritional supplement.  ? CBG (last 3)  ?Recent Labs  ?  03/19/22 ?2112 03/20/22 ?1937 03/20/22 ?1149  ?GLUCAP 227* 126* 109*  ? ?  5/13 CBG a little elevated yesterday,  better today, continue to monitor ?16. Urinary retention: Has prostate cancer with mets? ?            --foley out.   voiding trial--successful, low residuals ?17. Bowels: moving bowels ? ?LOS: ?4 days ?A FACE TO FACE EVALUATION WAS PERFORMED ? ?Jennye Boroughs ?03/20/2022, 12:09 PM  ? ?  ?

## 2022-03-20 NOTE — Progress Notes (Addendum)
Physical Therapy Session Note ? ?Patient Details  ?Name: Kevin [name] ?MRN: [id number] ?Date of Birth: 1946/04/03 ? ?Today's Date: 03/20/2022 ?PT Individual Time: 0900-1010 ?PT Individual Time Calculation (min): 70 min  ? ?Short Term Goals: ?Week 1:  PT Short Term Goal 1 (Week 1): STGs = LTGs due to ELOS ? ?Skilled Therapeutic Interventions/Progress Updates:  ?Pt resting in recliner.  He reported pain L hip 2/10, premedicated per pt. ? ?Reviewed pt's LLE and LUE precautions; he was vague about "not using my L arm" , unable to state use of platform on RW, but able to state that he should not cross his legs. ? ?Sit> stand from recliner to RPRW, mod/max assist as recliner is low for this pt's height.  Stand pivot to wc with CGA. Sit> stand from wc with CGA; stand> sit to raised mat table iwht min assist due to poor eccentric control. Transfer training for scooting forward in seat within L hip precautions, with fair carry over, mod cues. ? ?Gait training RPFW x 150' with CGA, and cues for upright posture and forward gaze. ? ?Strengthening in standing with LUE supported on padded stool, RUE on counter: 10 x 1 each- bil heel/toe raises,  2 x 10 mini squats and L hamstring curls. Wc propulsion using bil LEs only, x 50' slowly with supervision.  Mod cues for locking/unlocking brakes throughout session, and total assistance for locking L brake. ? ?At end of session, pt reclined in recliner, with Roho in seat to raise seat height, pillows beside hips for positioning and pillow under L forearm.  All needs left at hand and seat pad alarm set. ?   ? ?Therapy Documentation ?Precautions:  ?Precautions ?Precautions: Posterior Hip, Shoulder, Fall ?Shoulder Interventions: Shoulder sling/immobilizer, For comfort ?Precaution Booklet Issued: Yes (comment) ?Precaution Comments: NWB but OK for platform RW, AROM/PROM for shoulder extension/flexion, PROM shoulder abduction (NO ACTIVE ABDUCTION), pendulums OK ?Required Braces or Orthoses:  Sling ?Restrictions ?Weight Bearing Restrictions: Yes ?LUE Weight Bearing: Non weight bearing ?LLE Weight Bearing: Weight bearing as tolerated ?Other Position/Activity Restrictions: posterior hip precautions ?   ? ? ? ?Therapy/Group: Individual Therapy ? ?Aaleah Hirsch ?03/20/2022, 10:18 AM  ?

## 2022-03-20 NOTE — Plan of Care (Signed)
?  Problem: Consults ?Goal: RH GENERAL PATIENT EDUCATION ?Description: See Patient Education module for education specifics. ?Outcome: Progressing ?Goal: Skin Care Protocol Initiated - if Braden Score 18 or less ?Description: If consults are not indicated, leave blank or document N/A ?Outcome: Progressing ?Goal: Diabetes Guidelines if Diabetic/Glucose > 140 ?Description: If diabetic or lab glucose is > 140 mg/dl - Initiate Diabetes/Hyperglycemia Guidelines & Document Interventions  ?Outcome: Progressing ?  ?Problem: RH BLADDER ELIMINATION ?Goal: RH STG MANAGE BLADDER WITH ASSISTANCE ?Description: STG Manage Bladder With Supervision Assistance ?Outcome: Progressing ?Goal: RH STG MANAGE BLADDER WITH MEDICATION WITH ASSISTANCE ?Description: STG Manage Bladder With Medication With Supervision Assistance. ?Outcome: Progressing ?  ?Problem: RH SKIN INTEGRITY ?Goal: RH STG MAINTAIN SKIN INTEGRITY WITH ASSISTANCE ?Description: STG Maintain Skin Integrity With Supervision Assistance. ?Outcome: Progressing ?Goal: RH STG ABLE TO PERFORM INCISION/WOUND CARE W/ASSISTANCE ?Description: STG Able To Perform Incision/Wound Care With Supervision Assistance. ?Outcome: Progressing ?  ?Problem: RH SAFETY ?Goal: RH STG ADHERE TO SAFETY PRECAUTIONS W/ASSISTANCE/DEVICE ?Description: STG Adhere to Safety Precautions With Cues and Reminders. ?Outcome: Progressing ?Goal: RH STG DECREASED RISK OF FALL WITH ASSISTANCE ?Description: STG Decreased Risk of Fall With Supervision Assistance. ?Outcome: Progressing ?  ?Problem: RH PAIN MANAGEMENT ?Goal: RH STG PAIN MANAGED AT OR BELOW PT'S PAIN GOAL ?Description: < 3 on a 0-10 pain scale. ?Outcome: Progressing ?  ?Problem: RH KNOWLEDGE DEFICIT GENERAL ?Goal: RH STG INCREASE KNOWLEDGE OF SELF CARE AFTER HOSPITALIZATION ?Description: Patient will demonstrate knowledge of self-care management, medication/pain management, skin/wound care, weight bearing precautions with educational materials and handouts  provided by staff independently at discharge. ?Outcome: Progressing ?  ?

## 2022-03-20 NOTE — Progress Notes (Signed)
Occupational Therapy Session Note ? ?Patient Details  ?Name: Kevin [name] ?MRN: [id number] ?Date of Birth: 11/11/1945 ? ?Today's Date: 03/20/2022 ?OT Individual Time: 6789-3810 ?OT Individual Time Calculation (min): 29 min  ? ? ?Short Term Goals: ?Week 1:  OT Short Term Goal 1 (Week 1): STG= LTG d/t ELOS ? ?   ? ?Therapy Documentation ?Precautions:  ?Precautions ?Precautions: Posterior Hip, Shoulder, Fall ?Shoulder Interventions: Shoulder sling/immobilizer, For comfort ?Precaution Booklet Issued: Yes (comment) ?Precaution Comments: NWB but OK for platform RW, AROM/PROM for shoulder extension/flexion, PROM shoulder abduction (NO ACTIVE ABDUCTION), pendulums OK ?Required Braces or Orthoses: Sling ?Restrictions ?Weight Bearing Restrictions: Yes ?LUE Weight Bearing: Non weight bearing ?LLE Weight Bearing: Weight bearing as tolerated ?Other Position/Activity Restrictions: posterior hip precautions ?General: ?  ?Vital Signs: ?Therapy Vitals ?Temp: 98.1 ?F (36.7 ?C) ?Temp Source: Oral ?Pulse Rate: 100 ?Resp: 17 ?BP: 133/64 ?Patient Position (if appropriate): Lying ?Oxygen Therapy ?SpO2: 100 % ?O2 Device: Room Air ?Pain: Patient expressed mild pain when transitioning from supine to sit and sit to stand. ?  ?ADL: ?ADL ?Eating: Modified independent ?Where Assessed-Eating: Bed level ?Grooming: Supervision/safety ?Where Assessed-Grooming: Standing at sink ?Upper Body Bathing: Supervision/safety ?Where Assessed-Upper Body Bathing: Edge of bed ?Lower Body Bathing: Minimal assistance ?Where Assessed-Lower Body Bathing: Edge of bed ?Upper Body Dressing: Minimal assistance ?Where Assessed-Upper Body Dressing: Edge of bed ?Lower Body Dressing: Moderate assistance ?Where Assessed-Lower Body Dressing: Edge of bed ?Toileting: Minimal assistance ?Where Assessed-Toileting: Toilet ?Toilet Transfer: Contact guard ?Toilet Transfer Method: Ambulating ?Toilet Transfer Equipment: Bedside commode ?Tub/Shower Transfer: Unable to assess ? ?   ?Balance: Needed CGA close SBA for pendulum exercise in standing ?  ?Exercises: Patient seen for education of LUE safety and restrictions. Patient has difficulty not weight bearing through the LUE when moving from supine to sitting and sit to stand. Patient tolerated active forward reach tasks, being cautious to avoid abduction. Stood to perform pendulum exercise with cues to allow weight shifts through the trunk and LE's to initiate the movement vs active swinging. Patient returned to sitting on the bed with min assist not to plop down. Min assist provided due to fatigue to LLE when returning to supine. Continue with skilled OT treatment plan. ?  ?Other Treatments: Education on edema management.  ? ? ?Therapy/Group: Individual Therapy ? ?Hermina Barters ?03/20/2022, 3:52 PM ?

## 2022-03-20 NOTE — Plan of Care (Signed)
?  Problem: RH SKIN INTEGRITY ?Goal: RH STG MAINTAIN SKIN INTEGRITY WITH ASSISTANCE ?Description: STG Maintain Skin Integrity With Supervision Assistance. ?Outcome: Progressing ?  ?

## 2022-03-21 DIAGNOSIS — K5900 Constipation, unspecified: Secondary | ICD-10-CM

## 2022-03-21 LAB — GLUCOSE, CAPILLARY
Glucose-Capillary: 106 mg/dL — ABNORMAL HIGH (ref 70–99)
Glucose-Capillary: 194 mg/dL — ABNORMAL HIGH (ref 70–99)
Glucose-Capillary: 127 mg/dL — ABNORMAL HIGH (ref 70–99)
Glucose-Capillary: 174 mg/dL — ABNORMAL HIGH (ref 70–99)

## 2022-03-21 MED ORDER — ALFUZOSIN HCL ER 10 MG PO TB24
10.0000 mg | ORAL_TABLET | Freq: Every day | ORAL | Status: DC
  Administered 2022-03-22 – 2022-03-25 (×4): 10 mg via ORAL
  Filled 2022-03-21 (×4): qty 1

## 2022-03-21 MED ORDER — LORAZEPAM 0.5 MG PO TABS
0.5000 mg | ORAL_TABLET | Freq: Every day | ORAL | Status: DC | PRN
  Administered 2022-03-21: 0.5 mg via ORAL
  Filled 2022-03-21: qty 1

## 2022-03-21 NOTE — Plan of Care (Signed)
?  Problem: Consults ?Goal: RH GENERAL PATIENT EDUCATION ?Description: See Patient Education module for education specifics. ?Outcome: Progressing ?Goal: Skin Care Protocol Initiated - if Braden Score 18 or less ?Description: If consults are not indicated, leave blank or document N/A ?Outcome: Progressing ?Goal: Diabetes Guidelines if Diabetic/Glucose > 140 ?Description: If diabetic or lab glucose is > 140 mg/dl - Initiate Diabetes/Hyperglycemia Guidelines & Document Interventions  ?Outcome: Progressing ?  ?Problem: RH BLADDER ELIMINATION ?Goal: RH STG MANAGE BLADDER WITH ASSISTANCE ?Description: STG Manage Bladder With Supervision Assistance ?Outcome: Progressing ?Goal: RH STG MANAGE BLADDER WITH MEDICATION WITH ASSISTANCE ?Description: STG Manage Bladder With Medication With Supervision Assistance. ?Outcome: Progressing ?  ?Problem: RH SKIN INTEGRITY ?Goal: RH STG MAINTAIN SKIN INTEGRITY WITH ASSISTANCE ?Description: STG Maintain Skin Integrity With Supervision Assistance. ?Outcome: Progressing ?Goal: RH STG ABLE TO PERFORM INCISION/WOUND CARE W/ASSISTANCE ?Description: STG Able To Perform Incision/Wound Care With Supervision Assistance. ?Outcome: Progressing ?  ?Problem: RH SAFETY ?Goal: RH STG ADHERE TO SAFETY PRECAUTIONS W/ASSISTANCE/DEVICE ?Description: STG Adhere to Safety Precautions With Cues and Reminders. ?Outcome: Progressing ?Goal: RH STG DECREASED RISK OF FALL WITH ASSISTANCE ?Description: STG Decreased Risk of Fall With Supervision Assistance. ?Outcome: Progressing ?  ?Problem: RH PAIN MANAGEMENT ?Goal: RH STG PAIN MANAGED AT OR BELOW PT'S PAIN GOAL ?Description: < 3 on a 0-10 pain scale. ?Outcome: Progressing ?  ?Problem: RH KNOWLEDGE DEFICIT GENERAL ?Goal: RH STG INCREASE KNOWLEDGE OF SELF CARE AFTER HOSPITALIZATION ?Description: Patient will demonstrate knowledge of self-care management, medication/pain management, skin/wound care, weight bearing precautions with educational materials and handouts  provided by staff independently at discharge. ?Outcome: Progressing ?  ?

## 2022-03-21 NOTE — Progress Notes (Signed)
?                                                       PROGRESS NOTE ? ? ?Subjective/Complaints: ?In bed this AM. Have a little more difficulty voiding bladder. No other  complaints or concerns.  ? ?ROS: No fever, chills, SOB, CP, Abdominal pain, N/V/D/C ? ?Objective: ?  ?No results found. ?No results for input(s): WBC, HGB, HCT, PLT in the last 72 hours. ? ?No results for input(s): NA, K, CL, CO2, GLUCOSE, BUN, CREATININE, CALCIUM in the last 72 hours. ? ? ?Intake/Output Summary (Last 24 hours) at 03/21/2022 1207 ?Last data filed at 03/21/2022 0537 ?Gross per 24 hour  ?Intake 440 ml  ?Output 1375 ml  ?Net -935 ml  ? ?  ? ?  ? ?Physical Exam: ?Vital Signs ?Blood pressure 135/68, pulse 93, temperature 97.7 ?F (36.5 ?C), temperature source Oral, resp. rate 18, height '6\' 4"'$  (1.93 m), weight 64.3 kg, SpO2 95 %. ? ?Constitutional: No distress . Vital signs reviewed. In bed ?HEENT: NCAT, EOMI, oral membranes moist ?Neck: supple ?Cardiovascular: RRR without murmur.  ?Respiratory/Chest: CTA Bilaterally without wheezes or rales ?GI/Abdomen: BS +, non-tender, non-distended ?Ext: no clubbing, cyanosis, or edema ?Psych: pleasant and cooperative   ?Skin: Left hip wound, shoulder wounds dressed/clean with post-op dressings/foam intact.  ?Neuro:  Alert and oriented x 3. Normal insight and awareness. Intact Memory. Normal language and speech. Cranial nerve exam unremarkable  ?Musculoskeletal: left thigh still sl swollen ? ? ?Assessment/Plan: ?1. Functional deficits which require 3+ hours per day of interdisciplinary therapy in a comprehensive inpatient rehab setting. ?Physiatrist is providing close team supervision and 24 hour management of active medical problems listed below. ?Physiatrist and rehab team continue to assess barriers to discharge/monitor patient progress toward functional and medical goals ? ?Care Tool: ? ?Bathing ?   ?Body parts bathed by patient: Right arm, Left arm, Chest, Abdomen, Front perineal area, Buttocks,  Right upper leg, Left upper leg, Face  ? Body parts bathed by helper: Right lower leg, Left lower leg ?  ?  ?Bathing assist Assist Level: Minimal Assistance - Patient > 75% (Per OT eval note) ?  ?  ?Upper Body Dressing/Undressing ?Upper body dressing   ?  ?   ?Upper body assist Assist Level: Minimal Assistance - Patient > 75% ?   ?Lower Body Dressing/Undressing ?Lower body dressing ? ? ?   ?What is the patient wearing?: Pants ? ?  ? ?Lower body assist Assist for lower body dressing: Moderate Assistance - Patient 50 - 74% ?   ? ?Toileting ?Toileting    ?Toileting assist Assist for toileting: Moderate Assistance - Patient 50 - 74% ?  ?  ?Transfers ?Chair/bed transfer ? ?Transfers assist ?   ? ?Chair/bed transfer assist level: Contact Guard/Touching assist ?  ?  ?Locomotion ?Ambulation ? ? ?Ambulation assist ? ?   ? ?Assist level: Contact Guard/Touching assist ?Assistive device: Walker-platform ?Max distance: 150  ? ?Walk 10 feet activity ? ? ?Assist ?   ? ?Assist level: Contact Guard/Touching assist ?Assistive device: Walker-rolling, Walker-platform  ? ?Walk 50 feet activity ? ? ?Assist   ? ?Assist level: Contact Guard/Touching assist ?Assistive device: Walker-platform  ? ? ?Walk 150 feet activity ? ? ?Assist   ? ?Assist level: Contact Guard/Touching assist ?Assistive device: Walker-platform ?  ? ?  Walk 10 feet on uneven surface  ?activity ? ? ?Assist   ? ? ?Assist level: Minimal Assistance - Patient > 75% ?Assistive device: Walker-platform  ? ?Wheelchair ? ? ? ? ?Assist Is the patient using a wheelchair?: Yes ?Type of Wheelchair: Manual ?  ? ?Wheelchair assist level: Supervision/Verbal cueing ?Max wheelchair distance: 40  ? ? ?Wheelchair 50 feet with 2 turns activity ? ? ? ?Assist ? ?  ?  ? ? ?Assist Level: Supervision/Verbal cueing  ? ?Wheelchair 150 feet activity  ? ? ? ?Assist ?   ? ? ?   ? ?Blood pressure 135/68, pulse 93, temperature 97.7 ?F (36.5 ?C), temperature source Oral, resp. rate 18, height '6\' 4"'$  (1.93  m), weight 64.3 kg, SpO2 95 %. ? ?Medical Problem List and Plan: ?1. Functional deficits secondary to fall with L femoral neck fracture- and L humeral fx-  ?            -patient may  shower- cover L incision ?            -ELOS/Goals: 5-7 days mod I to supervision ? -Continue CIR therapies including PT, OT  ? -Min A sit to stand with PT ?2.  Antithrombotics: ?-DVT/anticoagulation:  Pharmaceutical: Lovenox X 4 weeks started  ?            -antiplatelet therapy:  Plavix ?3. Pain Management: Hydrocodone prn.  ? -5/13 Pain well controlled, continues to use hydrocodone, continue current medications ?4. Mood: LCSW to follow for evaluation and support.  ?            -antipsychotic agents: N/A ?5. Neuropsych: This patient is not fully  capable of making decisions on his own behalf. ?6. Skin/Wound Care: Routine pressure relief measures ?            --Left hip dressing to stay in place for 2 weeks--remains clean ?            --Left shoulder foam dressings changed every 1-2 days.  ?7. Fluids/Electrolytes/Nutrition: pt is eating 10-100% of meals.  ? -will add daily megace per wife's request ?8. Left proximal Humerus Fx s/p ORIF- NWB LUE with platform walker per [name] ?            --passive and active shoulder flex/ext, passive (NO ACTIVE) shoulder abduction ?            --sling prn and unrestricted ROM L-elbow, forearm, wrist, hand ?9. Left femoral neck Fx s/p hemi: WBAT LLE with posterior hip precautions ?            --Aquacel dressing intact till follow up appt 2 weeks w/Dr. Yetta Numbers ?10. Thrombocytopenia: resolved ?11. ABLA: Stable post transfusion. Continue to monitor H/H. ?            --5/10 hgb 9.1. stable ?12. Acute on chronic renal failure: BUN/SCR 17/1.34 at admission-->31/1.38. ?            -encourage fluid intake.  ? -bun/cr in line with prior labs ? -Recheck labs Monday is schedule ?13. Depression/Insomnia: Managed with Wellbutrin and Remeron.  ? -5/13 Reports mood good overall today ?14.  Dementia: Stable on  Namenda and Aricept.  ? -observe for carryover with therapies.  ?15. T2DM: Monitor BS ac/hs. Use SSI for elevated BS ?            --added juven/prostat as nutritional supplement.  ? CBG (last 3)  ?Recent Labs  ?  03/20/22 ?1623 03/20/22 ?2108 03/21/22 ?0602  ?GLUCAP  165* 135* 106*  ?  5/14 CBG good overall, continue to follow ?16. Urinary retention: Has prostate cancer with mets? ?            --foley out.   voiding trial--successful, low residuals ? -Start Alfluzosin for urinary retention ?17. Bowels: moving bowels, BM earlier today ? ?LOS: ?5 days    ?A FACE TO FACE EVALUATION WAS PERFORMED ? ?Jennye Boroughs ?03/21/2022, 12:07 PM  ? ?  ?

## 2022-03-22 LAB — GLUCOSE, CAPILLARY
Glucose-Capillary: 128 mg/dL — ABNORMAL HIGH (ref 70–99)
Glucose-Capillary: 162 mg/dL — ABNORMAL HIGH (ref 70–99)
Glucose-Capillary: 196 mg/dL — ABNORMAL HIGH (ref 70–99)
Glucose-Capillary: 179 mg/dL — ABNORMAL HIGH (ref 70–99)

## 2022-03-22 LAB — CBC
HCT: 31.6 % — ABNORMAL LOW (ref 39.0–52.0)
Hemoglobin: 10.3 g/dL — ABNORMAL LOW (ref 13.0–17.0)
MCH: 33.6 pg (ref 26.0–34.0)
MCHC: 32.6 g/dL (ref 30.0–36.0)
MCV: 102.9 fL — ABNORMAL HIGH (ref 80.0–100.0)
Platelets: 311 10*3/uL (ref 150–400)
RBC: 3.07 MIL/uL — ABNORMAL LOW (ref 4.22–5.81)
RDW: 13.7 % (ref 11.5–15.5)
WBC: 9.7 10*3/uL (ref 4.0–10.5)
nRBC: 0 % (ref 0.0–0.2)

## 2022-03-22 LAB — BASIC METABOLIC PANEL
Anion gap: 6 (ref 5–15)
BUN: 23 mg/dL (ref 8–23)
CO2: 25 mmol/L (ref 22–32)
Calcium: 8.9 mg/dL (ref 8.9–10.3)
Chloride: 107 mmol/L (ref 98–111)
Creatinine, Ser: 1.52 mg/dL — ABNORMAL HIGH (ref 0.61–1.24)
GFR, Estimated: 47 mL/min — ABNORMAL LOW (ref >60)
Glucose, Bld: 161 mg/dL — ABNORMAL HIGH (ref 70–99)
Potassium: 4.6 mmol/L (ref 3.5–5.1)
Sodium: 138 mmol/L (ref 135–145)

## 2022-03-22 LAB — PREALBUMIN: Prealbumin: 16.2 mg/dL — ABNORMAL LOW (ref 18–38)

## 2022-03-22 MED ORDER — MEGESTROL ACETATE 400 MG/10ML PO SUSP
400.0000 mg | Freq: Two times a day (BID) | ORAL | Status: DC
Start: 2022-03-22 — End: 2022-03-29
  Administered 2022-03-22 – 2022-03-29 (×14): 400 mg via ORAL
  Filled 2022-03-22 (×14): qty 10

## 2022-03-22 NOTE — Progress Notes (Signed)
Occupational Therapy Weekly Progress Note ? ?Patient Details  ?Name: Kevin [name] ?MRN: [id number] ?Date of Birth: 1946-03-01 ? ?Beginning of progress report period: Mar 15, 2022 ?End of progress report period: Mar 22, 2022 ? ?Today's Date: 03/22/2022 ?OT Individual Time: 07:30 - 08:44 ?   ? ? ?Patient has met 3 of 8 short term goals.  Due to short LOS patient's STG's are same as LTG's ? ?Patient continues to demonstrate the following deficits: muscle weakness and therefore will continue to benefit from skilled OT intervention to enhance overall performance with BADL and iADL. ? ?Patient progressing toward long term goals..  Continue plan of care. ? ?OT Short Term Goals ?Week 1:  OT Short Term Goal 1 (Week 1): STG= LTG d/t ELOS ?Patient met STG of Dressing upper body with CGA, Patient able to perform grooming with SBA standing at the sink, Patient able to bathe upper body with SPV. Continue progressing toward remainder of LTG's ? ?Skilled Therapeutic Interventions/Progress Updates: Patient seen for AM ADL. Assisted patient to EOB with hand for support at patient's request to get to EOB from supine. Patient states "[name] can do that" re: wife helping when he goes home. Sit to stand required min assist from bed to platform walker. Patient with groaning during movement, but reports only minimal pain getting up. Ambulated to sink for grooming tasks standing with SBA. Followed grooming with walk to shower. Sat at shower bench and doffed clothes using reacher, set up with verbal cues. Patient able to bathe upper body without assist. Used long handled sponge to wash LE's but therapist assisted for thoroughness. Patient educated on dressing equipment. Min assist to use equipment for LE dressing. Min assist with UE dressing and educated on dressing LUE first to reduce pain and prevent abduction.  Patient very motivated to learn techniques to become independent but did confirm he would likely have his wife assist with some  dressing activities vs utilize AE, especially when fatigued or trying to get dressed quickly for an appointment. 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 Patient is making good progress towards his LTG's continue working towards the 5 remaining goals prior to d/c. HHOT recommended at discharge. No DME needs at this time. ?   ? ?Therapy Documentation ?Precautions:  ?Precautions ?Precautions: Posterior Hip, Shoulder, Fall ?Shoulder Interventions: Shoulder sling/immobilizer, For comfort ?Precaution Booklet Issued: Yes (comment) ?Precaution Comments: NWB but OK for platform RW, AROM/PROM for shoulder extension/flexion, PROM shoulder abduction (NO ACTIVE ABDUCTION), pendulums OK ?Required Braces or Orthoses: Sling ?Restrictions ?Weight Bearing Restrictions: Yes ?LUE Weight Bearing: Non weight bearing ?LLE Weight Bearing: Weight bearing as tolerated ?Other Position/Activity Restrictions: posterior hip precautions ? ? ?Pain:Patient reports mild pain but frequently groans with transitional movements. ?  ?ADL: ?ADL ?Eating: Modified independent ?Where Assessed-Eating: Bed level ?Grooming: Supervision/safety ?Where Assessed-Grooming: Standing at sink ?Upper Body Bathing: Supervision/safety ?Where Assessed-Upper Body Bathing: Seated in shower ?Lower Body Bathing: Minimal assistance ?Where Assessed-Lower Body Bathing: Seated in shower ?Upper Body Dressing: Minimal assistance ?Where Assessed-Upper Body Dressing: wheelchair ?Lower Body Dressing: Minimal assistance with AE ?Where Assessed-Lower Body Dressing: wheelchair ?Toileting: Minimal assistance ?Where Assessed-Toileting: Toilet ?Toilet Transfer: Contact guard ?Toilet Transfer Method: Ambulating ?Toilet Transfer Equipment: Bedside commode ?Tub/Shower Transfer: CGA Ambulating with platform walker ?Vision- Glasses ?   ? ? ?Therapy/Group: Individual  Therapy ? ?Hermina Barters ?03/22/2022, 8:39 AM  ?

## 2022-03-22 NOTE — Progress Notes (Signed)
Occupational Therapy Session Note ? ?Patient Details  ?Name: Kevin [name] ?MRN: [id number] ?Date of Birth: April 28, 1946 ? ?Today's Date: 03/22/2022 ?OT Individual Time: 0865-7846 ?OT Individual Time Calculation (min): 55 min  ? ? ?Short Term Goals: ?Week 1:  OT Short Term Goal 1 (Week 1): STG= LTG d/t ELOS ? ?Skilled Therapeutic Interventions/Progress Updates:  ?  Pt seen for family education with his spouse.  Pt received in bed holding a urinal, stated that he could not go and he had been trying for sometime. Pt wanted to be cathed, informed RN. In the meantime encouraged pt to try to sit on the toilet.  To avoid putting weight on his L arm,had pt roll onto R hip and push up with R arm using guiding assist.  Placed pillow between legs for improved alignment of legs. ?Pt moaned in some pain but once sitting he was no longer in pain. Sit to stand from bed to [place] with CGA to light min A.  Ambulated to bathroom and sat on BSC over toilet. Only tolerated sitting for a few minutes due to pain from toileting feeling too low.  Had pt stand and hold urinal, pt stood for several minutes and stated he could not tolerate standing long and unable to void.  Had pt walk out of bathroom to sit in wc.  Adjusted BSC to tallest height possible due to pt's 6'4" height.  ? ?Discussed with wife how the level of physical A with his standing up and ambulation is quite light, Post hip precautions and the need to use AE, LUE WB and movement restrictions. In this discussion, she stated she feels prepared to take him home as "they have been in the same situation before".  She does not have a PFRW and would need the platform.  Discussed home shower set up and spouse and pt recognize the current set up needs major modifications as it has not been safe.  ? ?RN arrived for cathing,  pt stood up to ambulate back to EOB with CGA.  Needed min A to move back to supine to support R leg.  Pt in bed with nursing attending to him.  ? ?Therapy  Documentation ?Precautions:  ?Precautions ?Precautions: Posterior Hip, Shoulder, Fall ?Shoulder Interventions: Shoulder sling/immobilizer, For comfort ?Precaution Booklet Issued: Yes (comment) ?Precaution Comments: NWB but OK for platform RW, AROM/PROM for shoulder extension/flexion, PROM shoulder abduction (NO ACTIVE ABDUCTION), pendulums OK ?Required Braces or Orthoses: Sling ?Restrictions ?Weight Bearing Restrictions: Yes ?LUE Weight Bearing: Non weight bearing ?LLE Weight Bearing: Weight bearing as tolerated ?Other Position/Activity Restrictions: posterior hip precautions ? ?  ? ?Pain: c/o L hip pain with movement.   ?  ?ADL: ?ADL ?Eating: Modified independent ?Where Assessed-Eating: Bed level ?Grooming: Supervision/safety ?Where Assessed-Grooming: Standing at sink ?Upper Body Bathing: Supervision/safety ?Where Assessed-Upper Body Bathing: Edge of bed ?Lower Body Bathing: Minimal assistance ?Where Assessed-Lower Body Bathing: Edge of bed ?Upper Body Dressing: Minimal assistance ?Where Assessed-Upper Body Dressing: Edge of bed ?Lower Body Dressing: Moderate assistance ?Where Assessed-Lower Body Dressing: Edge of bed ?Toileting: Minimal assistance ?Where Assessed-Toileting: Toilet ?Toilet Transfer: Contact guard ?Toilet Transfer Method: Ambulating ?Toilet Transfer Equipment: Bedside commode ?Tub/Shower Transfer: Unable to assess ? ? ?Therapy/Group: Individual Therapy ? ?[place] ?03/22/2022, 10:11 AM ?

## 2022-03-22 NOTE — Progress Notes (Signed)
Physical Therapy Session Note ? ?Patient Details  ?Name: Kevin [name] ?MRN: [id number] ?Date of Birth: 12-25-1945 ? ?Today's Date: 03/22/2022 ?PT Individual Time: 1324-4010 ?PT Individual Time Calculation (min): 55 min  ? ?Short Term Goals: ?Week 1:  PT Short Term Goal 1 (Week 1): STGs = LTGs due to ELOS ? ?Skilled Therapeutic Interventions/Progress Updates:  ?   ?Pt received supine in bed and agrees to therapy. Reports pain in L hip and L shoulder. Number not provided. PT provides rest breaks and mobility to manage pain and RN notified of pain meds request. [name] present for family education. PT provides education on pt's mobility at rehab as well as precautions and strategies to maintain precautions following discharge. Also educated on recommendation to provide 24/7 supervision. Wife verbalizes understanding. ? ?Supine to sit to the R with cues for logrolling and modA. Sit to stand to platform RW with minA to facilitate anterior weight shift, and cues for hand placement. Pt ambulates to WC with CGA. WC transport to gym for time management. Pt completes car transfer with wife providing assistance and PT providing cues on hand placement and guarding. Pt ambulates x200' with wife providing CGA PT cueing for upright gaze to improve posture and balance. Wife cues to maintain hip precautions. Pt completes x2 3" steps with platform RW and minA, with cues for AD management and step sequencing.  ? ?WC transport back to room. Stand step back to bed with minA. Sit to supine with cueing for sequencing. Pt left supine with alarm intact and all needs within reach. ? ?Therapy Documentation ?Precautions:  ?Precautions ?Precautions: Posterior Hip, Shoulder, Fall ?Shoulder Interventions: Shoulder sling/immobilizer, For comfort ?Precaution Booklet Issued: Yes (comment) ?Precaution Comments: NWB but OK for platform RW, AROM/PROM for shoulder extension/flexion, PROM shoulder abduction (NO ACTIVE ABDUCTION), pendulums  OK ?Required Braces or Orthoses: Sling ?Restrictions ?Weight Bearing Restrictions: Yes ?LUE Weight Bearing: Non weight bearing ?LLE Weight Bearing: Weight bearing as tolerated ?Other Position/Activity Restrictions: posterior hip precautions ? ? ?Therapy/Group: Individual Therapy ? ?Breck Coons, PT, DPT ?03/22/2022, 5:12 PM  ?

## 2022-03-22 NOTE — Progress Notes (Signed)
?                                                       PROGRESS NOTE ? ? ?Subjective/Complaints: ?No new issues today. Anxious to get home. Says pain is controlled ? ?ROS: Patient denies fever, rash, sore throat, blurred vision, dizziness, nausea, vomiting, diarrhea, cough, shortness of breath or chest pain,  back/neck pain, headache, or mood change.  ? ?Objective: ?  ?No results found. ?Recent Labs  ?  03/22/22 ?0955  ?WBC 9.7  ?HGB 10.3*  ?HCT 31.6*  ?PLT 311  ? ?Recent Labs  ?  03/22/22 ?0955  ?NA 138  ?K 4.6  ?CL 107  ?CO2 25  ?GLUCOSE 161*  ?BUN 23  ?CREATININE 1.52*  ?CALCIUM 8.9  ? ? ?Intake/Output Summary (Last 24 hours) at 03/22/2022 1138 ?Last data filed at 03/22/2022 0900 ?Gross per 24 hour  ?Intake 358 ml  ?Output 1925 ml  ?Net -1567 ml  ?  ? ?  ? ?Physical Exam: ?Vital Signs ?Blood pressure (!) 150/139, pulse 91, temperature 98.7 ?F (37.1 ?C), temperature source Oral, resp. rate 18, height '6\' 4"'$  (1.93 m), weight 64.3 kg, SpO2 100 %. ? ?Constitutional: No distress . Vital signs reviewed. ?HEENT: NCAT, EOMI, oral membranes moist ?Neck: supple ?Cardiovascular: RRR without murmur. No JVD    ?Respiratory/Chest: CTA Bilaterally without wheezes or rales. Normal effort    ?GI/Abdomen: BS +, non-tender, non-distended ?Ext: no clubbing, cyanosis, or edema ?Psych: pleasant and cooperative  ?Skin: wounds cdi, dressings in place ?Neuro:  Alert and oriented x 3. Normal insight and awareness. Intact Memory. Normal language and speech. Cranial nerve exam unremarkable  ?Musculoskeletal: left thigh swelling better ? ? ?Assessment/Plan: ?1. Functional deficits which require 3+ hours per day of interdisciplinary therapy in a comprehensive inpatient rehab setting. ?Physiatrist is providing close team supervision and 24 hour management of active medical problems listed below. ?Physiatrist and rehab team continue to assess barriers to discharge/monitor patient progress toward functional and medical goals ? ?Care  Tool: ? ?Bathing ?   ?Body parts bathed by patient: Right arm, Left arm, Chest, Abdomen, Front perineal area, Right upper leg, Left upper leg, Right lower leg, Left lower leg, Face  ? Body parts bathed by helper: Right lower leg, Left lower leg, Buttocks ?  ?  ?Bathing assist Assist Level: Minimal Assistance - Patient > 75% ?  ?  ?Upper Body Dressing/Undressing ?Upper body dressing   ?What is the patient wearing?: Pull over shirt ?   ?Upper body assist Assist Level: Contact Guard/Touching assist ?   ?Lower Body Dressing/Undressing ?Lower body dressing ? ? ?   ?What is the patient wearing?: Underwear/pull up, Pants ? ?  ? ?Lower body assist Assist for lower body dressing: Minimal Assistance - Patient > 75% ?   ? ?Toileting ?Toileting    ?Toileting assist Assist for toileting: Minimal Assistance - Patient > 75% ?  ?  ?Transfers ?Chair/bed transfer ? ?Transfers assist ?   ? ?Chair/bed transfer assist level: Contact Guard/Touching assist ?  ?  ?Locomotion ?Ambulation ? ? ?Ambulation assist ? ?   ? ?Assist level: Contact Guard/Touching assist ?Assistive device: Walker-platform ?Max distance: 150  ? ?Walk 10 feet activity ? ? ?Assist ?   ? ?Assist level: Contact Guard/Touching assist ?Assistive device: Walker-rolling, Walker-platform  ? ?Walk 50  feet activity ? ? ?Assist   ? ?Assist level: Contact Guard/Touching assist ?Assistive device: Walker-platform  ? ? ?Walk 150 feet activity ? ? ?Assist   ? ?Assist level: Contact Guard/Touching assist ?Assistive device: Walker-platform ?  ? ?Walk 10 feet on uneven surface  ?activity ? ? ?Assist   ? ? ?Assist level: Minimal Assistance - Patient > 75% ?Assistive device: Walker-platform  ? ?Wheelchair ? ? ? ? ?Assist Is the patient using a wheelchair?: Yes ?Type of Wheelchair: Manual ?  ? ?Wheelchair assist level: Supervision/Verbal cueing ?Max wheelchair distance: 40  ? ? ?Wheelchair 50 feet with 2 turns activity ? ? ? ?Assist ? ?  ?  ? ? ?Assist Level: Supervision/Verbal cueing   ? ?Wheelchair 150 feet activity  ? ? ? ?Assist ?   ? ? ?   ? ?Blood pressure (!) 150/139, pulse 91, temperature 98.7 ?F (37.1 ?C), temperature source Oral, resp. rate 18, height '6\' 4"'$  (1.93 m), weight 64.3 kg, SpO2 100 %. ? ?Medical Problem List and Plan: ?1. Functional deficits secondary to fall with L femoral neck fracture- and L humeral fx-  ?            -patient may  shower- cover L incision ?            -ELOS/Goals: 5-7 days mod I to supervision ? -Continue CIR therapies including PT, OT  ?2.  Antithrombotics: ?-DVT/anticoagulation:  Pharmaceutical: Lovenox X 4 weeks started  ?            -antiplatelet therapy:  Plavix ?3. Pain Management: Hydrocodone prn.  ? -5/15 Pain well controlled, continues to use hydrocodone, continue current medications ?4. Mood: LCSW to follow for evaluation and support.  ?            -antipsychotic agents: N/A ?5. Neuropsych: This patient is not fully  capable of making decisions on his own behalf. ?6. Skin/Wound Care: Routine pressure relief measures ?            --Left hip dressing to stay in place for 2 weeks--remove this week ?            --Left shoulder foam dressings changed every 1-2 days.  ?7. Fluids/Electrolytes/Nutrition: p's intake still sporadic despite megace ? 5/15 increase megace to bid ?8. Left proximal Humerus Fx s/p ORIF- NWB LUE with platform walker per [name] ?            --passive and active shoulder flex/ext, passive (NO ACTIVE) shoulder abduction ?            --sling prn and unrestricted ROM L-elbow, forearm, wrist, hand ?9. Left femoral neck Fx s/p hemi: WBAT LLE with posterior hip precautions ?            --Aquacel dressing intact till follow up appt 2 weeks w/Dr. Yetta Numbers ?10. Thrombocytopenia: resolved ?11. ABLA: Stable post transfusion. Continue to monitor H/H. ?            --5/10 hgb 9.1. stable ?12. Acute on chronic renal failure: BUN/SCR 17/1.34 at admission-->31/1.38. ?            -encourage fluid intake.  ? -Cr slightly up today but likely normal  variation. BUN ok ? -recheck labs wed ?13. Depression/Insomnia: Managed with Wellbutrin and Remeron.  ? -5/15 mood positive ?14.  Dementia: Stable on Namenda and Aricept.  ? -observe for carryover with therapies.  ?15. T2DM: Monitor BS ac/hs. Use SSI for elevated BS ?            --  added juven/prostat as nutritional supplement.  ? CBG (last 3)  ?Recent Labs  ?  03/21/22 ?1720 03/21/22 ?2106 03/22/22 ?0606  ?GLUCAP 127* 174* 128*  ?  5/15 CBG good overall, continue to follow ?16. Urinary retention: Has prostate cancer with mets? ?            --foley out.   voiding trial--successful, low residuals ? -Started Alfluzosin for better flow.--obsv ?17. Bowels: moving bowels  ? ?LOS: ?6 days    ?A FACE TO FACE EVALUATION WAS PERFORMED ? ?Meredith Staggers ?03/22/2022, 11:38 AM  ? ?  ?

## 2022-03-23 LAB — GLUCOSE, CAPILLARY
Glucose-Capillary: 106 mg/dL — ABNORMAL HIGH (ref 70–99)
Glucose-Capillary: 151 mg/dL — ABNORMAL HIGH (ref 70–99)
Glucose-Capillary: 167 mg/dL — ABNORMAL HIGH (ref 70–99)
Glucose-Capillary: 177 mg/dL — ABNORMAL HIGH (ref 70–99)

## 2022-03-23 NOTE — Patient Care Conference (Signed)
Inpatient RehabilitationTeam Conference and Plan of Care Update ?Date: 03/23/2022   Time: 10:31 AM  ? ? ?Patient Name: Kevin [name]      ?Medical Record Number: [id number]  ?Date of Birth: [birth year] ?Sex: Male         ?Room/Bed: 3X10G/2I94W-54 ?Payor Info: Payor: [employer] / Plan: [place] / Product Type: *No Product type* /   ? ?Admit Date/Time:  03/16/2022  1:10 PM ? ?Primary Diagnosis:  Hip fracture requiring operative repair [facility]) ? ?Hospital Problems: Principal Problem: ?  Hip fracture requiring operative repair [facility]) ?Active Problems: ?  Urinary retention with incomplete bladder emptying ?  Fracture of neck of left humerus, closed, initial encounter ? ? ? ?Expected Discharge Date: Expected Discharge Date: 03/26/22 ? ?Team Members Present: ?Physician leading conference: Dr. Alger Simons ?Social Worker Present: Loralee Pacas, LCSWA ?Nurse Present: Dorthula Nettles, RN ?PT Present: Page Spiro, PT ?OT Present: Laverle Hobby, OT ?PPS Coordinator present : Gunnar Fusi, SLP ? ?   Current Status/Progress Goal Weekly Team Focus  ?Bowel/Bladder ? ? incontinent b/b, I&O caths  regain continence  toilet q 2hrs and prn   ?Swallow/Nutrition/ Hydration ? ?           ?ADL's ? ? min A overall, CGA once on his feet. Progressing well with pendulums but still requires cueing for adherence to NWB precautions. Family edu done  Goals set for (S)  ADL retraining, transfers, functional activity tolerance, safety.   ?Mobility ? ? bed mobility CGA to minA, sit to stand CGA to minA, gait x400' with platform RW  Supervision  bed mobility, awareness of precautions, transfes, balance, ambulation, DC prep   ?Communication ? ?           ?Safety/Cognition/ Behavioral Observations ?           ?Pain ? ? no pain reported  < 3  assess q 4hr and prn   ?Skin ? ? hip and arm incisions  no breakdown  assess skin q shift and prn   ? ? ?Discharge Planning:  ?D/c to home with his wife who will  provide 24/7 care.  [place] for HHPT/OT/SLP/aide.Fam edu (5/15) 1pm until completed with pt wife.   ?Team Discussion: ?Pain controlled. Sutures and staples to be removed later this week. Medication for bladder may be contributing to dizziness. Needs to stand to void. Incontinent bowel. Incisions are clean. Won't wear sling. Has HH in place from PTA. ? ?Patient on target to meet rehab goals: ?yes, supervision goals. Currently min assist overall. Has all equipment. Does report shoulder pain with therapy. BP 79/50 with dizziness, ace wrap to lower extremities helped. ? ?*See Care Plan and progress notes for long and short-term goals.  ? ?Revisions to Treatment Plan:  ?None at this time. ?  ?Teaching Needs: ?Family education, medication/pain management, bowel/bladder management, skin/wound care, transfer/gait training, etc. ?  ?Current Barriers to Discharge: ?Decreased caregiver support, Home enviroment access/layout, Incontinence, Wound care, Lack of/limited family support, and Weight bearing restrictions ? ?Possible Resolutions to Barriers: ?Family education ?Time voiding schedule ?Stand to void ?  ? ? Medical Summary ?Current Status: left humeral fx, left hi fx after rfall. pain controlled. sutures out this week. mild urine retention ? Barriers to Discharge: Medical stability ?  ?Possible Resolutions to [employer]: daily assessment of labs, stand tgo urinate ? ? ?Continued Need for Acute Rehabilitation Level of Care: The patient requires daily medical management by a physician with specialized training  in physical medicine and rehabilitation for the following reasons: ?Direction of a multidisciplinary physical rehabilitation program to maximize functional independence : Yes ?Medical management of patient stability for increased activity during participation in an intensive rehabilitation regime.: Yes ?Analysis of laboratory values and/or radiology reports with any subsequent need for medication adjustment  and/or medical intervention. : Yes ? ? ?I attest that I was present, lead the team conference, and concur with the assessment and plan of the team. ? ? ?Dorthula Nettles G ?03/23/2022, 1:59 PM  ? ? ? ? ? ? ?

## 2022-03-23 NOTE — Progress Notes (Signed)
Occupational Therapy Session Note ? ?Patient Details  ?Name: Kevin [name] ?MRN: [id number] ?Date of Birth: 06-19-1946 ? ?Session 1 ?Today's Date: 03/23/2022 ?OT Individual Time: 0601-5615 ?OT Individual Time Calculation (min): 60 min  ? ?Session 2 ?Today's Date: 03/23/2022 ?OT Individual Time: 3794-3276 ?OT Individual Time Calculation (min): 30 min  and Today's Date: 03/23/2022 ?OT Missed Time: 30 Minutes ?Missed Time Reason: Patient fatigue ? ? ?Short Term Goals: ?Week 1:  OT Short Term Goal 1 (Week 1): STG= LTG d/t ELOS ? ?Skilled Therapeutic Interventions/Progress Updates:  ?   ?Session 1 ?Pt received supine with no c/o pain, agreeable to OT session. Pt required mod A to come to EOB with management to adhere to LLE posterior hip precautions. Pt completed 200 ft of functional mobility to the therapy gym with the platform RW. He completed 3x5-8 blocked practice sit <> stands for carryover of proper body mechanics/motor planning, and for strengthening/endurance training. Min A initially required fading to [place]. He completed core strengthening exercises with intended carryover to bed mobility-he was provided a posterior support to grade activity down and reduce strain through L hip. 3x10 repetitions. Pt completed modified pendulums in standing with poor motor planning, performing more like gravity reduced shoulder flex/ext with momentum assisting. He was able to perform ~30 degrees of shoulder flex like this vs 5 degrees against gravity. Pt completed 200 ft of functional mobility back to his room with the platform RW at CGA level. Pt returned to supine and was left with all needs met, bed alarm set. Pt left with ice bag for his L UE, as well as positioned in elevation. Edu on 20/20 principle.  ? ?Session 2: ?Pt received supine with c/o bladder pressure. He was more confused and had difficulty following conversation re catheter use, if he had a catheter in currently, and management at home. Gentle education and  explanation. Pt also c/o pain in his L hip, unrated. He was agreeable to attempt toileting EOB. He required min A to come to EOB. He stood from EOB with heavy posterior lean despite cueing and mod A to correct. He stood while OT managed clothing and placed urinal. He was unable to void and requested to sit- pt also becoming quite pale but color returning upon sit. He stood one more time to get closer to EOB- and was able to stand with only CGA as he corrected for posterior lean. He took several lateral steps to the [facility] with only CGA using the platform RW. He returned to supine and was left with all needs met, bed alarm set. Wife present. NT Kambi entering room to bladder scan. 30 min missed d/t fatigue and nursing care for bladder scan.  ? ? ? ?Therapy Documentation ?Precautions:  ?Precautions ?Precautions: Posterior Hip, Shoulder, Fall ?Shoulder Interventions: Shoulder sling/immobilizer, For comfort ?Precaution Booklet Issued: Yes (comment) ?Precaution Comments: NWB but OK for platform RW, AROM/PROM for shoulder extension/flexion, PROM shoulder abduction (NO ACTIVE ABDUCTION), pendulums OK ?Required Braces or Orthoses: Sling ?Restrictions ?Weight Bearing Restrictions: Yes ?LUE Weight Bearing: Non weight bearing ?LLE Weight Bearing: Weight bearing as tolerated ?Other Position/Activity Restrictions: posterior hip precautions ? ?Therapy/Group: Individual Therapy ? ?Curtis Sites ?03/23/2022, 6:36 AM ?

## 2022-03-23 NOTE — Progress Notes (Addendum)
?                                                       PROGRESS NOTE ? ? ?Subjective/Complaints: ?Left shoulder a little sore after therapies. No new complaints  ? ?ROS: Patient denies fever, rash, sore throat, blurred vision, dizziness, nausea, vomiting, diarrhea, cough, shortness of breath or chest pain, joint or back/neck pain, headache, or mood change.  ? ?Objective: ?  ?No results found. ?Recent Labs  ?  03/22/22 ?0955  ?WBC 9.7  ?HGB 10.3*  ?HCT 31.6*  ?PLT 311  ? ?Recent Labs  ?  03/22/22 ?0955  ?NA 138  ?K 4.6  ?CL 107  ?CO2 25  ?GLUCOSE 161*  ?BUN 23  ?CREATININE 1.52*  ?CALCIUM 8.9  ? ? ?Intake/Output Summary (Last 24 hours) at 03/23/2022 0927 ?Last data filed at 03/23/2022 4008 ?Gross per 24 hour  ?Intake 240 ml  ?Output 1150 ml  ?Net -910 ml  ?  ? ?  ? ?Physical Exam: ?Vital Signs ?Blood pressure 125/66, pulse 95, temperature 98.3 ?F (36.8 ?C), resp. rate 15, height '6\' 4"'$  (1.93 m), weight 64.3 kg, SpO2 98 %. ? ?Constitutional: No distress . Vital signs reviewed. ?HEENT: NCAT, EOMI, oral membranes moist ?Neck: supple ?Cardiovascular: RRR without murmur. No JVD    ?Respiratory/Chest: CTA Bilaterally without wheezes or rales. Normal effort    ?GI/Abdomen: BS +, non-tender, non-distended ?Ext: no clubbing, cyanosis, or edema ?Psych: pleasant and cooperative  ?Skin: wounds cdi with sutures left shoulder, left hip covered. dressings in place ?Neuro:  Alert and oriented x 3. Normal insight and awareness. Intact Memory. Normal language and speech. Cranial nerve exam unremarkable  ?Musculoskeletal: left thigh swelling better ? ? ?Assessment/Plan: ?1. Functional deficits which require 3+ hours per day of interdisciplinary therapy in a comprehensive inpatient rehab setting. ?Physiatrist is providing close team supervision and 24 hour management of active medical problems listed below. ?Physiatrist and rehab team continue to assess barriers to discharge/monitor patient progress toward functional and medical  goals ? ?Care Tool: ? ?Bathing ?   ?Body parts bathed by patient: Right arm, Left arm, Chest, Abdomen, Front perineal area, Right upper leg, Left upper leg, Right lower leg, Left lower leg, Face  ? Body parts bathed by helper: Right lower leg, Left lower leg, Buttocks ?  ?  ?Bathing assist Assist Level: Minimal Assistance - Patient > 75% ?  ?  ?Upper Body Dressing/Undressing ?Upper body dressing   ?What is the patient wearing?: Pull over shirt ?   ?Upper body assist Assist Level: Contact Guard/Touching assist ?   ?Lower Body Dressing/Undressing ?Lower body dressing ? ? ?   ?What is the patient wearing?: Underwear/pull up, Pants ? ?  ? ?Lower body assist Assist for lower body dressing: Minimal Assistance - Patient > 75% ?   ? ?Toileting ?Toileting    ?Toileting assist Assist for toileting: Minimal Assistance - Patient > 75% ?  ?  ?Transfers ?Chair/bed transfer ? ?Transfers assist ?   ? ?Chair/bed transfer assist level: Contact Guard/Touching assist ?  ?  ?Locomotion ?Ambulation ? ? ?Ambulation assist ? ?   ? ?Assist level: Contact Guard/Touching assist ?Assistive device: Walker-platform ?Max distance: 150  ? ?Walk 10 feet activity ? ? ?Assist ?   ? ?Assist level: Contact Guard/Touching assist ?Assistive device: Walker-rolling, Walker-platform  ? ?  Walk 50 feet activity ? ? ?Assist   ? ?Assist level: Contact Guard/Touching assist ?Assistive device: Walker-platform  ? ? ?Walk 150 feet activity ? ? ?Assist   ? ?Assist level: Contact Guard/Touching assist ?Assistive device: Walker-platform ?  ? ?Walk 10 feet on uneven surface  ?activity ? ? ?Assist   ? ? ?Assist level: Minimal Assistance - Patient > 75% ?Assistive device: Walker-platform  ? ?Wheelchair ? ? ? ? ?Assist Is the patient using a wheelchair?: Yes ?Type of Wheelchair: Manual ?  ? ?Wheelchair assist level: Supervision/Verbal cueing ?Max wheelchair distance: 40  ? ? ?Wheelchair 50 feet with 2 turns activity ? ? ? ?Assist ? ?  ?  ? ? ?Assist Level:  Supervision/Verbal cueing  ? ?Wheelchair 150 feet activity  ? ? ? ?Assist ?   ? ? ?   ? ?Blood pressure 125/66, pulse 95, temperature 98.3 ?F (36.8 ?C), resp. rate 15, height '6\' 4"'$  (1.93 m), weight 64.3 kg, SpO2 98 %. ? ?Medical Problem List and Plan: ?1. Functional deficits secondary to fall with L femoral neck fracture- and L humeral fx-  ?            -patient may  shower- cover L incision ?            -ELOS/Goals: 5-7 days mod I to supervision ? -Continue CIR therapies including PT and OT. Interdisciplinary team conference today to discuss goals, barriers to discharge, and dc planning.   ?2.  Antithrombotics: ?-DVT/anticoagulation:  Pharmaceutical: Lovenox X 4 weeks started  ?            -antiplatelet therapy:  Plavix ?3. Pain Management: Hydrocodone prn.  ? -5/15 Pain well controlled, continues to use hydrocodone  ?  -ice prn left shoulder ?4. Mood: LCSW to follow for evaluation and support.  ?            -antipsychotic agents: N/A ?5. Neuropsych: This patient is not fully  capable of making decisions on his own behalf. ?6. Skin/Wound Care: Routine pressure relief measures ?            --Left hip dressing to stay in place for 2 weeks--remove Thursday ?            --Left shoulder foam dressings --remove Thursday   ?7. Fluids/Electrolytes/Nutrition: still sporadic ? 5/16 increased megace to bid ?8. Left proximal Humerus Fx s/p ORIF- NWB LUE with platform walker per [name] ?            --passive and active shoulder flex/ext, passive (NO ACTIVE) shoulder abduction ?            --sling prn and unrestricted ROM L-elbow, forearm, wrist, hand ?9. Left femoral neck Fx s/p hemi: WBAT LLE with posterior hip precautions ?            --Aquacel dressing intact till follow up appt 2 weeks w/Dr. Yetta Numbers ?10. Thrombocytopenia: resolved ?11. ABLA: Stable post transfusion. Continue to monitor H/H. ?            --5/10 hgb 9.1. stable ?12. Acute on chronic renal failure: BUN/SCR 17/1.34 at admission-->31/1.38. ?             -encourage fluid intake.  ? -Cr slightly up today but likely normal variation. BUN ok ? -recheck labs Wed ?13. Depression/Insomnia: Managed with Wellbutrin and Remeron.  ? -5/16 mood positive ?14.  Dementia: Stable on Namenda and Aricept.  ? -observe for carryover with therapies.  ?15. T2DM: Monitor BS ac/hs.  Use SSI for elevated BS ?            --added juven/prostat as nutritional supplement.  ? CBG (last 3)  ?Recent Labs  ?  03/22/22 ?1655 03/22/22 ?2104 03/23/22 ?0628  ?GLUCAP 196* 179* 106*  ?  5/16 some elevation. Monitor for pattern ?16. Urinary retention: Has prostate cancer with mets? ?            --foley out.   voiding trial--successful, with intermittent caths ? -Started Alfluzosin for better flow.--obsv bp's ?17. Bowels: moving bowels  ? ?LOS: ?7 days    ?A FACE TO FACE EVALUATION WAS PERFORMED ? ?Meredith Staggers ?03/23/2022, 9:27 AM  ? ?  ?

## 2022-03-23 NOTE — Progress Notes (Signed)
Pt showed multiple blood clots during in & out cath. Pt encouraged to increase fluid intake.  ?

## 2022-03-23 NOTE — Progress Notes (Addendum)
Patient ID: Kevin [name], male   DOB: [birth year], 76 y.o.   MRN: [id number] ? ?1241-SW spoke with pt wife to provide updates from team conference, and d/c date 5/19. SW informed Left platform attachment for RW will come from [place] and can not ensure it will be there by discharge but will make efforts.  ? ?SW updated [place] ([facility] and [place] SW Cassie on d/c.  ? ?SW sent Cassie order for DME. ? ?Loralee Pacas, MSW, LCSWA ?Office: [phone] ?Cell: [phone] ?Fax: [phone]  ?

## 2022-03-23 NOTE — Progress Notes (Signed)
Physical Therapy Session Note ? ?Patient Details  ?Name: Kevin [name] ?MRN: [id number] ?Date of Birth: [birth year] ? ?Today's Date: 03/23/2022 ?PT Individual Time: 7169-6789 and [phone] ?PT Individual Time Calculation (min): 57 min and 25 min ? ?Short Term Goals: ?Week 1:  PT Short Term Goal 1 (Week 1): STGs = LTGs due to ELOS ? ?Skilled Therapeutic Interventions/Progress Updates:  ?   ?1st Session: Pt received supine in bed and agrees to therapy, though reports significant pain in L shoulder. PT educates on avoiding movement and use of L arm due to injuries. RN informed and brings pain for pt. Supine to sit to the R side of bed with cues for body mechanics and sequencing. Sit to stand with platform RW and minA with cues for anterior weight shift and hand placement. Pt ambulates to WC with CGA and stand to sit requires minA to control descent. WC transport to gym. Pt mentions feeling dizzy and lethargic. BP taken at 79/50. PT applies bilateral ace wraps to lower extremities and BP taken again at 99/70. Pt ambulates x350' with platform RW and cues for upright gaze to improve posture and balance. Following extended seated rest break, pt ambulates back to room, x150' with platform RW. Multimodal cues for AD management and positioning to safely transfer back bed. Sit to supine with cues for body mechanics. Pt left supine with alarm intact and all needs within reach. ? ? ?2nd Session: Pt received supine in bed and agrees to therapy. Reports pain in L shoulder. RN notified and brings pt meds at end of session. Supine to sit with heavy modA due to posterior bias. [name] also required for sit to stand with pt demonstrating retropulsion. PT assists pt to position L forearm in platform. Pt ambulates x300' with platform RW and CGA, with cues for upright gaze to improve posture and balance, and increasing bilateral stride length to decrease risk for falls. MinA and verbal cues for body mechanics of stand to sit. Following  seated rest break, pt ambulate additional x300' back to room. MaxA required for sit to supine due to pain. Left supine with alarm intact and all needs within reach. ? ?Therapy Documentation ?Precautions:  ?Precautions ?Precautions: Posterior Hip, Shoulder, Fall ?Shoulder Interventions: Shoulder sling/immobilizer, For comfort ?Precaution Booklet Issued: Yes (comment) ?Precaution Comments: NWB but OK for platform RW, AROM/PROM for shoulder extension/flexion, PROM shoulder abduction (NO ACTIVE ABDUCTION), pendulums OK ?Required Braces or Orthoses: Sling ?Restrictions ?Weight Bearing Restrictions: Yes ?LUE Weight Bearing: Non weight bearing ?LLE Weight Bearing: Weight bearing as tolerated ?Other Position/Activity Restrictions: posterior hip precautions ? ? ? ?Therapy/Group: Individual Therapy ? ?Breck Coons, PT, DPT ?03/23/2022, 4:51 PM  ?

## 2022-03-24 LAB — CBC
HCT: 29.7 % — ABNORMAL LOW (ref 39.0–52.0)
Hemoglobin: 9.5 g/dL — ABNORMAL LOW (ref 13.0–17.0)
MCH: 33.2 pg (ref 26.0–34.0)
MCHC: 32 g/dL (ref 30.0–36.0)
MCV: 103.8 fL — ABNORMAL HIGH (ref 80.0–100.0)
Platelets: 285 10*3/uL (ref 150–400)
RBC: 2.86 MIL/uL — ABNORMAL LOW (ref 4.22–5.81)
RDW: 13.4 % (ref 11.5–15.5)
WBC: 12.5 10*3/uL — ABNORMAL HIGH (ref 4.0–10.5)
nRBC: 0 % (ref 0.0–0.2)

## 2022-03-24 LAB — GLUCOSE, CAPILLARY
Glucose-Capillary: 98 mg/dL (ref 70–99)
Glucose-Capillary: 146 mg/dL — ABNORMAL HIGH (ref 70–99)
Glucose-Capillary: 181 mg/dL — ABNORMAL HIGH (ref 70–99)
Glucose-Capillary: 89 mg/dL (ref 70–99)

## 2022-03-24 LAB — URINALYSIS, ROUTINE W REFLEX MICROSCOPIC
Bilirubin Urine: NEGATIVE
Glucose, UA: NEGATIVE mg/dL
Ketones, ur: NEGATIVE mg/dL
Nitrite: NEGATIVE
Protein, ur: NEGATIVE mg/dL
Specific Gravity, Urine: 1.017 (ref 1.005–1.030)
WBC, UA: >50 WBC/hpf — ABNORMAL HIGH (ref 0–5)
pH: 5 (ref 5.0–8.0)

## 2022-03-24 MED ORDER — BETHANECHOL CHLORIDE 10 MG PO TABS
10.0000 mg | ORAL_TABLET | Freq: Three times a day (TID) | ORAL | Status: DC
  Administered 2022-03-24 – 2022-03-26 (×7): 10 mg via ORAL
  Filled 2022-03-24 (×7): qty 1

## 2022-03-24 MED ORDER — ENSURE ENLIVE PO LIQD
237.0000 mL | Freq: Three times a day (TID) | ORAL | Status: DC
  Administered 2022-03-25 – 2022-03-28 (×9): 237 mL via ORAL

## 2022-03-24 MED ORDER — ADULT MULTIVITAMIN W/MINERALS CH: 1.0000 | ORAL_TABLET | Freq: Every day | ORAL | Status: DC

## 2022-03-24 NOTE — Progress Notes (Signed)
Patient dt cath for 350 ml. Patient tolerated well. UA CNS sent down as ordered.  ?

## 2022-03-24 NOTE — Progress Notes (Deleted)
Wound vac removed as ordered. Patient tolerated well. Staples CDI, no S/S of infection.  ?

## 2022-03-24 NOTE — Progress Notes (Signed)
Physical Therapy Session Note  Patient Details  Name: Kevin [name] MRN: [id number] Date of Birth: 12/14/1945  Today's Date: 03/24/2022 PT Individual Time: 1111-1210 and 1340-1420 PT Individual Time Calculation (min): 59 min and 40 min  Short Term Goals: Week 1:  PT Short Term Goal 1 (Week 1): STGs = LTGs due to ELOS  Skilled Therapeutic Interventions/Progress Updates:     1st Session: Pt received supine in bed and agrees to therapy. Reports pain in L shoulder and hip. PT provides rest breaks and mobility to manage pain. Pt performs supine to sit to R side of bed with minA and cues for positioning and sequencing. Sit to stand requires modA and facilitation of anterior weight shift. PT assists with donning pants and shoes. Pt then performs stand pivot to [facility] with minA and cues for sequencing and positioning. WC transport to gym for time management. Pt performs stand pivot to mat table with minA. Pt trials use of hemiwalker with R hand to limit use of L upper extremity. Pt ambulates x30' with hemiwalker with minA and cues for AD management and sequencing, but has difficulty completing safely.   Pt performs sit to supine with cueing for sequencing. Pt completes supine therex for strengthening and ROM. PT provides verbal and tactile cueing for optimal performance. Pt completes 2x15: SAQs Hip abduction with LLE SLRs Heel slides Supine to sit from flat mat with supervision and cues for body mechanics. Stand pivot back to WC with minA. Left seated with alarm intact and all needs within reach.  2nd Session: Pt received supine in bed and agrees to therapy. Reports pain in L shoulder and hip. Number not provided. PT provides bracing, repositioning, and rest breaks to manage pain. Supine to sit to pt's L with modA and cues for sequencing and positioning. Pt performs sit to stand from slightly elevated EOB with modA due to posterior bias. Pt ambulates with platform RW x150' to gym with CGA and cues for  upright gaze to improve posture and balance, and increasing R stride length to increase stance time on L and promote reciprocal gait pattern. Pt takes seated rest break on high low mat table. Pt performs repeated reps of sit to stand for motor pattern training and strengthening. PT demonstrates forward weight shift necessary to perform sit to stand. Pt initially requires modA to complete with ongoing posterior bias, but is able to improve with practice. Pt eventually completes 3x5 reps of sit to stand with CGA and cues for foot placement, sequencing, and body mechanics. Pt ambulates back to room with platform RW and CGA. Sit to supine with minA. Left with alarm intact and all needs within reach.  Therapy Documentation Precautions:  Precautions Precautions: Posterior Hip, Shoulder, Fall Shoulder Interventions: Shoulder sling/immobilizer, For comfort Precaution Booklet Issued: Yes (comment) Precaution Comments: NWB but OK for platform RW, AROM/PROM for shoulder extension/flexion, PROM shoulder abduction (NO ACTIVE ABDUCTION), pendulums OK Required Braces or Orthoses: Sling Restrictions Weight Bearing Restrictions: Yes LUE Weight Bearing: Non weight bearing LLE Weight Bearing: Weight bearing as tolerated Other Position/Activity Restrictions: posterior hip precautions    Therapy/Group: Individual Therapy  Breck Coons 03/24/2022, 1:58 PM

## 2022-03-24 NOTE — Progress Notes (Signed)
Occupational Therapy Session Note ? ?Patient Details  ?Name: Kevin [name] ?MRN: [id number] ?Date of Birth: [birth year] ? ?Today's Date: 03/24/2022 ?OT Individual Time: 1010-1105 ?OT Individual Time Calculation (min): 55 min  ? ? ?Short Term Goals: ?Week 1:  OT Short Term Goal 1 (Week 1): STG= LTG d/t ELOS ? ?Skilled Therapeutic Interventions/Progress Updates:  ?  Pt received supine with no c/o pain, agreeable to OT session. He reported being confused and asked "is it 10am or pm?". He spoke at length about not wanting to lay in the bed forever and then when OT initiated mobility he declined getting OOB. Presented ADL tasks and bathing/dressing and he was agreeable. BP supine 104/59. He came to EOB with min A and reported increase in pain in his L hip- unrated. He was able to continue through [place] with cueing. He completed UB dressing with min A to thread over his L operative UE. He reported high levels of dizziness and fatigue- BP 116/91. HR 111 bpm. He was adamant about returning to supine despite OT encouragement. He required no physical assist to bring his BLE back into bed. He was able to scoot  himself up with the bed in slight trend position. PA notified to come assess the LUE to ensure OT doing proper edema management. Edema massage provided as well as ace wrap to promote fluid reabsorption. PROM applied to his LUE especially elbow extension- pt unable to get final ~5 degrees. He was left supine with all needs met- LUE in elevated position. Bed alarm set.  ? ?Edema management: ? L forearm 1 in distal to the elbow: 9.25 vs 8 in R  ?L bicep 1 in proximal to the elbow: 9.3 vs R 7 ? ? ?Therapy Documentation ?Precautions:  ?Precautions ?Precautions: Posterior Hip, Shoulder, Fall ?Shoulder Interventions: Shoulder sling/immobilizer, For comfort ?Precaution Booklet Issued: Yes (comment) ?Precaution Comments: NWB but OK for platform RW, AROM/PROM for shoulder extension/flexion, PROM shoulder abduction (NO ACTIVE  ABDUCTION), pendulums OK ?Required Braces or Orthoses: Sling ?Restrictions ?Weight Bearing Restrictions: Yes ?LUE Weight Bearing: Non weight bearing ?LLE Weight Bearing: Weight bearing as tolerated ?Other Position/Activity Restrictions: posterior hip precautions ? ?Therapy/Group: Individual Therapy ? ?Curtis Sites ?03/24/2022, 11:30 AM ?

## 2022-03-24 NOTE — Progress Notes (Signed)
Occupational Therapy Session Note ? ?Patient Details  ?Name: Kevin [name] ?MRN: [id number] ?Date of Birth: Mar 11, 1946 ? ?Today's Date: 03/24/2022 ?OT Individual Time: 1010-1105 ?OT Individual Time Calculation (min): 55 min  ? ? ?Short Term Goals: ?Week 1:  OT Short Term Goal 1 (Week 1): STG= LTG d/t ELOS ? ?Skilled Therapeutic Interventions/Progress Updates:  ?  Pt received supine with no c/o pain, agreeable to OT session. He reported being confused and asked "is it 10am or pm?". He spoke at length about not wanting to lay in the bed forever and then when OT initiated mobility he declined getting OOB. Presented ADL tasks and bathing/dressing and he was agreeable. BP supine 104/59. He came to EOB with min A and reported increase in pain in his L hip- unrated. He was able to continue through [place] with cueing. He completed UB dressing with min A to thread over his L operative UE. He reported high levels of dizziness and fatigue- BP 116/91. HR 111 bpm. He was adamant about returning to supine despite OT encouragement. He required no physical assist to bring his BLE back into bed. He was able to scoot  himself up with the bed in slight trend position. PA notified to come assess the LUE to ensure OT doing proper edema management. Edema massage provided as well as ace wrap to promote fluid reabsorption. He was left supine with all needs met- LUE in elevated position. Bed alarm set.  ? ?Edema management: ? L forearm 1 in distal to the elbow: 9.25 vs 8 in R  ?L bicep 1 in proximal to the elbow: 9.3 vs R 7 ? ? ?Therapy Documentation ?Precautions:  ?Precautions ?Precautions: Posterior Hip, Shoulder, Fall ?Shoulder Interventions: Shoulder sling/immobilizer, For comfort ?Precaution Booklet Issued: Yes (comment) ?Precaution Comments: NWB but OK for platform RW, AROM/PROM for shoulder extension/flexion, PROM shoulder abduction (NO ACTIVE ABDUCTION), pendulums OK ?Required Braces or Orthoses: Sling ?Restrictions ?Weight  Bearing Restrictions: Yes ?LUE Weight Bearing: Non weight bearing ?LLE Weight Bearing: Weight bearing as tolerated ?Other Position/Activity Restrictions: posterior hip precautions ? ?Therapy/Group: Individual Therapy ? ?Curtis Sites ?03/24/2022, 6:58 AM ?

## 2022-03-24 NOTE — Progress Notes (Signed)
Physical Therapy Note ? ?Patient Details  ?Name: Kevin [name] ?MRN: [id number] ?Date of Birth: 1946-05-22 ?Today's Date: 03/24/2022 ? ? ? ?Attempted to see patient for make up therapy session. Pt received seated in bed having just finished with a PT session, declines participation in make up session at this time.  ? ? ?Excell Seltzer, PT, DPT, CSRS ?03/24/2022, 2:34 PM  ?

## 2022-03-24 NOTE — Progress Notes (Signed)
Nutrition Follow-up ? ?DOCUMENTATION CODES:  ? ?Severe malnutrition in context of chronic illness, Underweight ? ?INTERVENTION:  ? ?Continue Multivitamin w/ minerals daily ?Increased Ensure Enlive po TID, each supplement provides 350 kcal and 20 grams of protein. ?Continue Magic cup TID with meals, each supplement provides 290 kcal and 9 grams of protein ? ?NUTRITION DIAGNOSIS:  ? ?Severe Malnutrition related to chronic illness (CHF, dementia) as evidenced by severe muscle depletion, severe fat depletion. - Ongoing ? ?GOAL:  ? ?Patient will meet greater than or equal to 90% of their needs - Progressing  ? ?MONITOR:  ? ?PO intake, Supplement acceptance, Labs, Weight trends ? ?REASON FOR ASSESSMENT:  ? ?Other (Comment) (Low BMI) ?  ? ?ASSESSMENT:  ? ?76 y.o. male admitted to CIR after L humeral head and L femoral neck fracture, underwent surgical repair for both. PMH includes CHF, T2DM, dementia, prostate, malnutrition, CKD IIIa, and HTN.  ? ?Pt resting in bed, wife at bedside. ? ?Pt reports that he is eating well, wife reports that he is not eating that great. Pt states that he wants a piece of cake; RD to order. Wife brought in a sandwich for pt, but pt only took a few bite. Stated that he did not want it right now, but will eat it later. Endorses tat he does not know what he really wants to eat.  ?Per EMR, pt PO intake includes,  ?5/14: Dinner 50% ?5/15: Breakfast 25%, Lunch 100%, Dinner 25% ?5/16: Breakfast 50%, Lunch 50%, Dinner 50% ?5/17: Breakfast 80% ? ?Pt reports that he is drinking the Ensure's and likes chocolate and strawberry; pt agreeable to increase to 3 per day. Both endorse eating the magic cup, but state for a few days he did not receive any.  ? ?Medications reviewed and include: Colace, SSI 0-5 units daily +0-9 units TID, Megace, Remeron, MVI, Senokot ?Labs reviewed. ? ?Diet Order:   ?Diet Order   ? ?       ?  Diet regular Room service appropriate? Yes; Fluid consistency: Thin  Diet effective now        ?  ? ?  ?  ? ?  ? ? ?EDUCATION NEEDS:  ? ?No education needs have been identified at this time ? ?Skin:  Skin Assessment: Reviewed RN Assessment ? ?Last BM:  5/15 ? ?Height:  ?Ht Readings from Last 1 Encounters:  ?03/16/22 '6\' 4"'$  (1.93 m)  ? ?Weight:  ?Wt Readings from Last 1 Encounters:  ?03/16/22 64.3 kg  ? ?Ideal Body Weight:  91.8 kg ? ?BMI:  Body mass index is 17.26 kg/m?. ? ?Estimated Nutritional Needs:  ?Kcal:  2000-2200 ?Protein:  100-115 grams ?Fluid:  >/= 2 L ? ? ?Hermina Barters RD, LDN ?Clinical Dietitian ?See [facility] for contact information.  ?

## 2022-03-24 NOTE — Progress Notes (Signed)
?                                                       PROGRESS NOTE ? ? ?Subjective/Complaints: ?Pt slept well. Left shoulder not bothering him this morning. Still requiring intermittent I/o caths. Discussed po intake with him. He's not a big fan of food ? ?ROS: Patient denies fever, rash, sore throat, blurred vision, dizziness, nausea, vomiting, diarrhea, cough, shortness of breath or chest pain, back/neck pain, headache, or mood change.  ? ?Objective: ?  ?No results found. ?Recent Labs  ?  03/22/22 ?5465 03/24/22 ?6812  ?WBC 9.7 12.5*  ?HGB 10.3* 9.5*  ?HCT 31.6* 29.7*  ?PLT 311 285  ? ?Recent Labs  ?  03/22/22 ?0955  ?NA 138  ?K 4.6  ?CL 107  ?CO2 25  ?GLUCOSE 161*  ?BUN 23  ?CREATININE 1.52*  ?CALCIUM 8.9  ? ? ?Intake/Output Summary (Last 24 hours) at 03/24/2022 0816 ?Last data filed at 03/24/2022 0737 ?Gross per 24 hour  ?Intake 897 ml  ?Output 1800 ml  ?Net -903 ml  ?  ? ?  ? ?Physical Exam: ?Vital Signs ?Blood pressure 131/65, pulse 100, temperature 97.6 ?F (36.4 ?C), resp. rate 16, height '6\' 4"'$  (1.93 m), weight 64.3 kg, SpO2 98 %. ? ?Constitutional: No distress . Vital signs reviewed. ?HEENT: NCAT, EOMI, oral membranes moist ?Neck: supple ?Cardiovascular: RRR without murmur. No JVD    ?Respiratory/Chest: CTA Bilaterally without wheezes or rales. Normal effort    ?GI/Abdomen: BS +, non-tender, non-distended ?Ext: no clubbing, cyanosis, or edema ?Psych: pleasant and cooperative  ?Skin: wounds cdi with sutures left shoulder, left hip covered. dressings in place ?Neuro:  Alert and oriented x 3. Normal insight and awareness. Intact Memory. Normal language and speech. Cranial nerve exam unremarkable  ?Musculoskeletal: left thigh swelling improving ? ? ?Assessment/Plan: ?1. Functional deficits which require 3+ hours per day of interdisciplinary therapy in a comprehensive inpatient rehab setting. ?Physiatrist is providing close team supervision and 24 hour management of active medical problems listed  below. ?Physiatrist and rehab team continue to assess barriers to discharge/monitor patient progress toward functional and medical goals ? ?Care Tool: ? ?Bathing ?   ?Body parts bathed by patient: Right arm, Left arm, Chest, Abdomen, Front perineal area, Right upper leg, Left upper leg, Right lower leg, Left lower leg, Face  ? Body parts bathed by helper: Right lower leg, Left lower leg, Buttocks ?  ?  ?Bathing assist Assist Level: Minimal Assistance - Patient > 75% ?  ?  ?Upper Body Dressing/Undressing ?Upper body dressing   ?What is the patient wearing?: Pull over shirt ?   ?Upper body assist Assist Level: Contact Guard/Touching assist ?   ?Lower Body Dressing/Undressing ?Lower body dressing ? ? ?   ?What is the patient wearing?: Underwear/pull up, Pants ? ?  ? ?Lower body assist Assist for lower body dressing: Minimal Assistance - Patient > 75% ?   ? ?Toileting ?Toileting    ?Toileting assist Assist for toileting: Minimal Assistance - Patient > 75% ?  ?  ?Transfers ?Chair/bed transfer ? ?Transfers assist ?   ? ?Chair/bed transfer assist level: Contact Guard/Touching assist ?  ?  ?Locomotion ?Ambulation ? ? ?Ambulation assist ? ?   ? ?Assist level: Contact Guard/Touching assist ?Assistive device: Walker-platform ?Max distance: 150  ? ?Walk  10 feet activity ? ? ?Assist ?   ? ?Assist level: Contact Guard/Touching assist ?Assistive device: Walker-rolling, Walker-platform  ? ?Walk 50 feet activity ? ? ?Assist   ? ?Assist level: Contact Guard/Touching assist ?Assistive device: Walker-platform  ? ? ?Walk 150 feet activity ? ? ?Assist   ? ?Assist level: Contact Guard/Touching assist ?Assistive device: Walker-platform ?  ? ?Walk 10 feet on uneven surface  ?activity ? ? ?Assist   ? ? ?Assist level: Minimal Assistance - Patient > 75% ?Assistive device: Walker-platform  ? ?Wheelchair ? ? ? ? ?Assist Is the patient using a wheelchair?: Yes ?Type of Wheelchair: Manual ?  ? ?Wheelchair assist level: Supervision/Verbal  cueing ?Max wheelchair distance: 40  ? ? ?Wheelchair 50 feet with 2 turns activity ? ? ? ?Assist ? ?  ?  ? ? ?Assist Level: Supervision/Verbal cueing  ? ?Wheelchair 150 feet activity  ? ? ? ?Assist ?   ? ? ?   ? ?Blood pressure 131/65, pulse 100, temperature 97.6 ?F (36.4 ?C), resp. rate 16, height '6\' 4"'$  (1.93 m), weight 64.3 kg, SpO2 98 %. ? ?Medical Problem List and Plan: ?1. Functional deficits secondary to fall with L femoral neck fracture- and L humeral fx-  ?            -patient may  shower- cover L incision ?            -ELOS/Goals: 5/19,  mod I to supervision ? -Continue CIR therapies including PT, OT    ?2.  Antithrombotics: ?-DVT/anticoagulation:  Pharmaceutical: Lovenox X 4 weeks started  ?            -antiplatelet therapy:  Plavix ?3. Pain Management: Hydrocodone prn.  ? -5/15 Pain well controlled, continues to use hydrocodone  ?  -ice prn left shoulder ?4. Mood: LCSW to follow for evaluation and support.  ?            -antipsychotic agents: N/A ?5. Neuropsych: This patient is not fully  capable of making decisions on his own behalf. ?6. Skin/Wound Care: Routine pressure relief measures ?            --Left hip dressing to stay in place for 2 weeks--remove Thursday ?            --Left shoulder foam dressings --remove Thursday   ?7. Fluids/Electrolytes/Nutrition: still sporadic ? 5/16 increased megace to bid ?8. Left proximal Humerus Fx s/p ORIF- NWB LUE with platform walker per [name] ?            --passive and active shoulder flex/ext, passive (NO ACTIVE) shoulder abduction ?            --sling prn and unrestricted ROM L-elbow, forearm, wrist, hand ?9. Left femoral neck Fx s/p hemi: WBAT LLE with posterior hip precautions ?            --Aquacel dressing intact till follow up appt 2 weeks w/Dr. Yetta Numbers ?10. Thrombocytopenia: resolved ?11. ABLA: Stable post transfusion. Continue to monitor H/H. ?            --5/16 stable at 9.5 ?12. Acute on chronic renal failure: BUN/SCR 17/1.34 at  admission-->31/1.38. ?            -encourage fluid intake.  ? -Cr slightly up today but likely normal variation. BUN ok ? -recheck labs Wed ?13. Depression/Insomnia: Managed with Wellbutrin and Remeron.  ? -5/16 mood positive ?14.  Dementia: Stable on Namenda and Aricept.  ? -observe for carryover  with therapies.  ?15. T2DM: Monitor BS ac/hs. Use SSI for elevated BS ?            --added juven/prostat as nutritional supplement.  ? CBG (last 3)  ?Recent Labs  ?  03/23/22 ?1636 03/23/22 ?2108 03/24/22 ?8119  ?GLUCAP 167* 177* 98  ?  5/17 cover with SSI ?16. Urinary retention: Has prostate cancer with mets? ?            --foley out.  After initially doing well, seems to be having more difficulty emptying consistently ? -Started Alfluzosin for better flow ?5/17 -add urecholine today to help emptying ?-wbc's up to 12.5k, check ua, ucx ? ?17. Bowels: moving bowels  ? ?LOS: ?8 days    ?A FACE TO FACE EVALUATION WAS PERFORMED ? ?Meredith Staggers ?03/24/2022, 8:16 AM  ? ?  ?

## 2022-03-24 NOTE — Progress Notes (Signed)
Pt required in/out cath due to no void. Lidocaine and Coude cath used; per order. Pt tolerated the procedure well. Urine output did contain small blood clot; and pts brief did have blood present. Pt denies new additional pain.   ? ?

## 2022-03-25 ENCOUNTER — Inpatient Hospital Stay (HOSPITAL_COMMUNITY): Payer: No Typology Code available for payment source

## 2022-03-25 DIAGNOSIS — N390 Urinary tract infection, site not specified: Secondary | ICD-10-CM

## 2022-03-25 DIAGNOSIS — A499 Bacterial infection, unspecified: Secondary | ICD-10-CM

## 2022-03-25 LAB — GLUCOSE, CAPILLARY
Glucose-Capillary: 113 mg/dL — ABNORMAL HIGH (ref 70–99)
Glucose-Capillary: 146 mg/dL — ABNORMAL HIGH (ref 70–99)
Glucose-Capillary: 173 mg/dL — ABNORMAL HIGH (ref 70–99)
Glucose-Capillary: 166 mg/dL — ABNORMAL HIGH (ref 70–99)

## 2022-03-25 MED ORDER — ALFUZOSIN HCL ER 10 MG PO TB24
10.0000 mg | ORAL_TABLET | Freq: Every day | ORAL | Status: DC
Start: 2022-03-26 — End: 2022-03-29
  Administered 2022-03-26 – 2022-03-28 (×3): 10 mg via ORAL
  Filled 2022-03-25 (×3): qty 1

## 2022-03-25 MED ORDER — CEPHALEXIN 250 MG PO CAPS
500.0000 mg | ORAL_CAPSULE | Freq: Two times a day (BID) | ORAL | Status: DC
  Administered 2022-03-25 – 2022-03-29 (×9): 500 mg via ORAL
  Filled 2022-03-25 (×9): qty 2

## 2022-03-25 NOTE — Progress Notes (Addendum)
[facility]   Discussed with rehab team yesterday  Sutures to be removed today I ordered 2 week follow up xrays to be done this am   Xrays L shoulder today show that the distal aspect of plate has pulled out of the humeral shaft, proximal aspect of plate looks stable.  Alingment is reasonable but there is some shortening and translation   Upon further discussions with team it sounds as if the pt has been using his L arm in an unrestricted fashion due to his dementia.   He has been restricted from active shoulder abduction. Has been permitted active and passive shoulder flexion.  He has been NWB on his left upper extremity but can use the platform to help mobilize given R hip hemi.    At this point will keep in sling at all times except for hygiene    Will likely allow to heal in current position.  If further collapse or malalignment occurs it may be best to consider total shoulder arthroplasty.    Discussed with attending  Will not plan for further surgery at this time  Follow up in 10-14 days for further [place], PA-C [phone] (C) 03/25/2022, 12:06 PM  [facility] [place] [address] [phone] (F)      Patient ID: Kevin [name], male   DOB: 1946/07/14, 76 y.o.   MRN: [id number]

## 2022-03-25 NOTE — Progress Notes (Signed)
Occupational Therapy Weekly Progress Note  Patient Details  Name: Kevin [name] MRN: [id number] Date of Birth: [birth year]  Beginning of progress report period: Mar 17, 2022 End of progress report period: Mar 25, 2022  Today's Date: 03/25/2022 OT Individual Time: 1346-1500 OT Individual Time Calculation (min): 74 min    Long term goals not set due to ELOS, however, pt with recent decline due to UTI and hardware in L shoulder displaced. L UE in sling now at all times and no weight bearing. We are working towards different transfer methods to protect shoulder. Stand-pivots with HHA min to moderate assistance with difficulty maintaining anterior weight shift when not using adaptive device. Goals have also been downgraded to overall min A as pt will be very limited with hip precautions and L UE restrictions.   Patient continues to demonstrate the following deficits: muscle weakness, decreased cardiorespiratoy endurance, decreased awareness, and decreased sitting balance, decreased standing balance, decreased postural control, decreased balance strategies, and difficulty maintaining precautions and therefore will continue to benefit from skilled OT intervention to enhance overall performance with BADL and Reduce care partner burden.  Patient not progressing toward long term goals.  See goal revision..  Plan of care revisions: Min A overall.  OT Short Term Goals Week 1:  OT Short Term Goal 1 (Week 1): STG= LTG d/t ELOS Week 2:  OT Short Term Goal 1 (Week 2): LTG=STG 2/2 ELOS  Skilled Therapeutic Interventions/Progress Updates:    Pt greeted asleep in bed with spouse present. Discussed with pt and spouse ortho's findings of displaced hardware and how this will affect his everyday activities. Pt now in a sling at all times and PT/OT feel he is weight bearing too much through platform RW. Discussed working on stand-pivot transfers to decrease risk of falls and protect fx in shoulder. OT discussed  with PA to clarify and PA entered to discuss further with spouse. Pt agreeable to get up with encouragement. He needed mod A for bed mobility to get to sitting EOB. 3 trials to stand with cues for anterior weight shift. Once standing, he had STRONG posterior lean and was unsafe to keep pivoting to chiar, requiring mod A to return to bed safely. OT had pt practice anterior weight shift to stand again before pivot. Still required mod A to pivot L with posterior lean. Pt reported need to urinate and brought into bathroom w/ RW. Pt stood with min A and needed OT assist for clothing management. OT turned on water, then he was able to void in standing! Grooming completed at the sink, then pt brought to therapy gym. Focus on stand-pivot transfers without AD and anterior weight shift with sit<>stands. Pt completed 4 sets of 5 sit<>stands with min A. Blocked practice stand-pivot transfers L and R without AD progressing to min A overall. Pt returned to room and left seated in wc with alarm belt on, spouse present, call bell in reach, and needs met .  Therapy Documentation Precautions:  Precautions Precautions: Posterior Hip, Shoulder, Fall Shoulder Interventions: Shoulder sling/immobilizer, For comfort Precaution Booklet Issued: Yes (comment) Precaution Comments: NWB but OK for platform RW, AROM/PROM for shoulder extension/flexion, PROM shoulder abduction (NO ACTIVE ABDUCTION), pendulums OK Required Braces or Orthoses: Sling Restrictions Weight Bearing Restrictions: Yes LUE Weight Bearing: Non weight bearing LLE Weight Bearing: Weight bearing as tolerated Other Position/Activity Restrictions: posterior hip precautions General: General OT Amount of Missed Time: 45 Minutes Pain:  4/10 shoulder pain. Rest and repositioned for comfort.  Therapy/Group: Individual Therapy  Valma Cava 03/25/2022, 3:24 PM

## 2022-03-25 NOTE — Plan of Care (Signed)
Problem: RH Balance Goal: LTG Patient will maintain dynamic standing with ADLs (OT) Description: LTG:  Patient will maintain dynamic standing balance with assist during activities of daily living (OT)  Flowsheets (Taken 03/25/2022 1511) LTG: Pt will maintain dynamic standing balance during ADLs with: Contact Guard/Touching assist Note: Goal downgraded 3/18 due to hardware in L shoulder displaced, L UE in sling now at all times and no weight bearing-ESD   Problem: Sit to Stand Goal: LTG:  Patient will perform sit to stand in prep for activites of daily living with assistance level (OT) Description: LTG:  Patient will perform sit to stand in prep for activites of daily living with assistance level (OT) Flowsheets (Taken 03/25/2022 1511) LTG: PT will perform sit to stand in prep for activites of daily living with assistance level: Contact Guard/Touching assist Note: Goal downgraded 3/18 due to hardware in L shoulder displaced, L UE in sling now at all times and no weight bearing-ESD   Problem: RH Bathing Goal: LTG Patient will bathe all body parts with assist levels (OT) Description: LTG: Patient will bathe all body parts with assist levels (OT) Flowsheets (Taken 03/25/2022 1511) LTG: Pt will perform bathing with assistance level/cueing: Minimal Assistance - Patient > 75% Note: Goal downgraded 3/18 due to hardware in L shoulder displaced, L UE in sling now at all times and no weight bearing-ESD   Problem: RH Dressing Goal: LTG Patient will perform upper body dressing (OT) Description: LTG Patient will perform upper body dressing with assist, with/without cues (OT). Flowsheets (Taken 03/25/2022 1511) LTG: Pt will perform upper body dressing with assistance level of: Minimal Assistance - Patient > 75% Note: Goal downgraded 3/18 due to hardware in L shoulder displaced, L UE in sling now at all times and no weight bearing-ESD Goal: LTG Patient will perform lower body dressing w/assist  (OT) Description: LTG: Patient will perform lower body dressing with assist, with/without cues in positioning using equipment (OT) Flowsheets (Taken 03/25/2022 1511) LTG: Pt will perform lower body dressing with assistance level of: Moderate Assistance - Patient 50 - 74% Note: Goal downgraded 3/18 due to hardware in L shoulder displaced, L UE in sling now at all times and no weight bearing-ESD   Problem: RH Toileting Goal: LTG Patient will perform toileting task (3/3 steps) with assistance level (OT) Description: LTG: Patient will perform toileting task (3/3 steps) with assistance level (OT)  Flowsheets (Taken 03/25/2022 1511) LTG: Pt will perform toileting task (3/3 steps) with assistance level: Minimal Assistance - Patient > 75% Note: Goal downgraded 3/18 due to hardware in L shoulder displaced, L UE in sling now at all times and no weight bearing-ESD   Problem: RH Toilet Transfers Goal: LTG Patient will perform toilet transfers w/assist (OT) Description: LTG: Patient will perform toilet transfers with assist, with/without cues using equipment (OT) Flowsheets (Taken 03/25/2022 1511) LTG: Pt will perform toilet transfers with assistance level of: Minimal Assistance - Patient > 75% Note: Goal downgraded 3/18 due to hardware in L shoulder displaced, L UE in sling now at all times and no weight bearing-ESD   Problem: RH Tub/Shower Transfers Goal: LTG Patient will perform tub/shower transfers w/assist (OT) Description: LTG: Patient will perform tub/shower transfers with assist, with/without cues using equipment (OT) Flowsheets (Taken 03/25/2022 1511) LTG: Pt will perform tub/shower stall transfers with assistance level of: Minimal Assistance - Patient > 75% Note: Goal downgraded 3/18 due to hardware in L shoulder displaced, L UE in sling now at all times and no weight bearing-ESD

## 2022-03-25 NOTE — Progress Notes (Signed)
Inpatient Rehabilitation Discharge Medication Review by a Pharmacist  A complete drug regimen review was completed for this patient to identify any potential clinically significant medication issues.  High Risk Drug Classes Is patient taking? Indication by Medication  Antipsychotic No   Anticoagulant Yes Lovenox- VTE prophylaxis  Antibiotic Yes Keflex- UTI  Opioid Yes Norco- acute pain  Antiplatelet Yes Plavix- CVA prophylaxis  Hypoglycemics/insulin No   Vasoactive Medication Yes Coreg- hypertension Uroxatral- BPH  Chemotherapy No   Other Yes Wellbutrin- MDD Remeron- Sleep Donepezil / Memantine- Alzheimer's Lipitor- HLD Proscar- BPH Megace- weight gain Trazodone- sleep     Type of Medication Issue Identified Description of Issue Recommendation(s)  Drug Interaction(s) (clinically significant)     Duplicate Therapy     Allergy     No Medication Administration End Date     Incorrect Dose     Additional Drug Therapy Needed     Significant med changes from prior encounter (inform family/care partners about these prior to discharge).    Other       Clinically significant medication issues were identified that warrant physician communication and completion of prescribed/recommended actions by midnight of the next day:  No   Time spent performing this drug regimen review (minutes):  30  Danne Vasek BS, PharmD, BCPS Clinical Pharmacist 03/29/2022 11:17 AM  Contact: [phone] after 3 PM  "Be curious, not judgmental..." -Jamal Maes

## 2022-03-25 NOTE — Progress Notes (Signed)
Physical Therapy Session Note  Patient Details  Name: Kevin [name] MRN: [id number] Date of Birth: 01-18-1946  Today's Date: 03/25/2022 PT Individual Time: [phone] and 1120-1202 PT Individual Time Calculation (min): 26 min and 42 min  Short Term Goals: Week 1:  PT Short Term Goal 1 (Week 1): STGs = LTGs due to ELOS  Skilled Therapeutic Interventions/Progress Updates:    1st session: Pt received supine in bed and agrees to therapy. Reports pain in L shoulder and L hip. Number not provided. PT provides repositioning, rest breaks, and mobility to manage pain. Supine to sit with modA and cues for sequencing and positioning. Pt performs sit to stand from EOB with modA due to lack of sufficient anterior weight shift, despite max verbal and tactile cueing. Pt ambulates with platform RW x300' with close supervision and cues for upright gaze to improve posture and balance, and increasing R stride length to improve reciprocal gait pattern and increased stance time on L. Pt performs car transfer with verbal cues for sequencing. Following seated rest break, pt ambulates additional 300' back to room with platform RW and same cueing. Transfers to recliner with cues for positioning and hand placement. Pt left seated with alarm intact and all needs within reach.  2nd Session: Pt received supine in bed and agrees to therapy. Reports pain in L shoulder. PT provides education, repositioning, and rest breaks to manage pain.  Supine to sit with modA and cues for sequencing. Stand pivot transfer to [facility] with platform RW and modA due to posterior bias and inadequate anterior weight shift. WC transport to gym for time management. Pt reports feeling dizzy and fatigued. BP taken at 96/56. PT wraps both legs up to the knee with ace wrap to provide compression and improved venous return. BP again taken at 106/60. Pt perform sit to stand with hand rails of steps and minA, with cues for initiation and body mechanics. Pt  completes x8 3" steps with minA and cues for step sequencing. Following, pt's blood pressure taken at 121/70. WC transport back to room. Left seated with alarm intact and all needs within reach.  Therapy Documentation Precautions:  Precautions Precautions: Posterior Hip, Shoulder, Fall Shoulder Interventions: Shoulder sling/immobilizer, For comfort Precaution Booklet Issued: Yes (comment) Precaution Comments: NWB but OK for platform RW, AROM/PROM for shoulder extension/flexion, PROM shoulder abduction (NO ACTIVE ABDUCTION), pendulums OK Required Braces or Orthoses: Sling Restrictions Weight Bearing Restrictions: Yes LUE Weight Bearing: Non weight bearing LLE Weight Bearing: Weight bearing as tolerated Other Position/Activity Restrictions: posterior hip precautions  Therapy/Group: Individual Therapy  Breck Coons, PT, DPT 03/25/2022, 5:21 PM

## 2022-03-25 NOTE — Progress Notes (Signed)
Orthopedic Tech Progress Note Patient Details:  Kevin [name] 1946-10-06 [id number]  Ortho Devices Type of Ortho Device: Sling immobilizer Ortho Device/Splint Location: lue Ortho Device/Splint Interventions: Ordered, Application, Adjustment  I went to apply the sling immobilizer to the patient. The patient had the sling on but was missing the immobilizer strap. I applied the strap and left the additional sling in the room. Post Interventions Patient Tolerated: Well Instructions Provided: Care of device, Adjustment of device  Karolee Stamps 03/25/2022, 1:07 PM

## 2022-03-25 NOTE — Progress Notes (Signed)
Sutures removed per order. Patient tolerated well. Encouraged fluids.

## 2022-03-25 NOTE — Progress Notes (Signed)
Occupational Therapy Session Note  Patient Details  Name: Kevin [name] MRN: [id number] Date of Birth: [birth year]  Today's Date: 03/25/2022 OT Individual Time: 2993-7169 OT Individual Time Calculation (min): 30 min    Short Term Goals: Week 1:  OT Short Term Goal 1 (Week 1): STG= LTG d/t ELOS  Skilled Therapeutic Interventions/Progress Updates:    Session 1 Pt greeted seated in recliner and agreeable to OT treatment session although reporting pain in L shoulder. Pt declined to shower until his spouse arrives this afternoon. OT cued pt to scoot towards edge of recliner before attempting to stand. Pt needed 3 trials and max A to get to standing from lower recliner. He had strong posterior lean and had difficulty weight shifting to stand all the way up. Pt returned to sitting. Pt stated his shoulder hurt too bad and he needed to rest. Pt needed mod A to get to standing after extended rest break, then used platform RW to pivot to bed. OT examined L UE edema prior to pt returning to supine with supervision. Pt left semi-reclined in bed with bed alarm on, call bell in reach, and needs met.   Therapy Documentation Precautions:  Precautions Precautions: Posterior Hip, Shoulder, Fall Shoulder Interventions: Shoulder sling/immobilizer, For comfort Precaution Booklet Issued: Yes (comment) Precaution Comments: NWB but OK for platform RW, AROM/PROM for shoulder extension/flexion, PROM shoulder abduction (NO ACTIVE ABDUCTION), pendulums OK Required Braces or Orthoses: Sling Restrictions Weight Bearing Restrictions: Yes LUE Weight Bearing: Non weight bearing LLE Weight Bearing: Weight bearing as tolerated Other Position/Activity Restrictions: posterior hip precautions General: Pt missed 45 minutes of OT due to pain and fatigue.  Pain: Pt reports 6/10 pin in L shoulder. Rest and repositioned for comfort.    Therapy/Group: Individual Therapy  Valma Cava 03/25/2022, 3:19 PM

## 2022-03-25 NOTE — Progress Notes (Signed)
Patient ID: Kevin [name], male   DOB: 1946-07-09, 76 y.o.   MRN: [id number]  SW waiting on updates from [facility] about DME delivery.   SW received updates from medical team pt may not discharge tomorrow due to various medical concerns. SW spoke with wife Kevin [name] to inform his d/c date could possibly change. She is ok with d/c change, but states she has an event on Saturday to attend for her granddaughter in [place] and will need to work on someone staying with him if he does leave. SW encouraged her to begin looking for additional support. Informed there will be more updates tomorrow once the attending makes a decision.  Loralee Pacas, MSW, [place] Office: [phone] Cell: [phone] Fax: [phone]

## 2022-03-25 NOTE — Progress Notes (Signed)
PROGRESS NOTE   Subjective/Complaints: Pt in good spirits. Reported dizziness yesterday and lower bp with therapy.  Only required 2 caths early in day.   ROS: Patient denies fever, rash, sore throat, blurred vision, dizziness, nausea, vomiting, diarrhea, cough, shortness of breath or chest pain,   back/neck pain, headache, or mood change.   Objective:   No results found. Recent Labs    03/22/22 0955 03/24/22 0647  WBC 9.7 12.5*  HGB 10.3* 9.5*  HCT 31.6* 29.7*  PLT 311 285   Recent Labs    03/22/22 0955  NA 138  K 4.6  CL 107  CO2 25  GLUCOSE 161*  BUN 23  CREATININE 1.52*  CALCIUM 8.9    Intake/Output Summary (Last 24 hours) at 03/25/2022 0918 Last data filed at 03/25/2022 0544 Gross per 24 hour  Intake 474 ml  Output 1850 ml  Net -1376 ml        Physical Exam: Vital Signs Blood pressure 109/64, pulse (!) 109, temperature 98.3 F (36.8 C), temperature source Oral, resp. rate 15, height '6\' 4"'$  (1.93 m), weight 64.3 kg, SpO2 99 %.  Constitutional: No distress . Vital signs reviewed. HEENT: NCAT, EOMI, oral membranes moist Neck: supple Cardiovascular: RRR without murmur. No JVD    Respiratory/Chest: CTA Bilaterally without wheezes or rales. Normal effort    GI/Abdomen: BS +, non-tender, non-distended Ext: no clubbing, cyanosis, or edema Psych: pleasant and cooperative  Skin: wounds cdi with sutures left shoulder, left hip CDI with sq suturing Neuro:  Alert and oriented x 3. Normal insight and awareness. Intact Memory. Normal language and speech. Cranial nerve exam unremarkable  Musculoskeletal: left thigh swelling better   Assessment/Plan: 1. Functional deficits which require 3+ hours per day of interdisciplinary therapy in a comprehensive inpatient rehab setting. Physiatrist is providing close team supervision and 24 hour management of active medical problems listed below. Physiatrist and rehab team  continue to assess barriers to discharge/monitor patient progress toward functional and medical goals  Care Tool:  Bathing    Body parts bathed by patient: Right arm, Left arm, Chest, Abdomen, Front perineal area, Right upper leg, Left upper leg, Right lower leg, Left lower leg, Face   Body parts bathed by helper: Right lower leg, Left lower leg, Buttocks     Bathing assist Assist Level: Minimal Assistance - Patient > 75%     Upper Body Dressing/Undressing Upper body dressing   What is the patient wearing?: Pull over shirt    Upper body assist Assist Level: Contact Guard/Touching assist    Lower Body Dressing/Undressing Lower body dressing      What is the patient wearing?: Underwear/pull up, Pants     Lower body assist Assist for lower body dressing: Minimal Assistance - Patient > 75%     Toileting Toileting    Toileting assist Assist for toileting: Minimal Assistance - Patient > 75%     Transfers Chair/bed transfer  Transfers assist     Chair/bed transfer assist level: Contact Guard/Touching assist     Locomotion Ambulation   Ambulation assist      Assist level: Contact Guard/Touching assist Assistive device: Walker-platform Max distance: 150   Walk  10 feet activity   Assist     Assist level: Contact Guard/Touching assist Assistive device: Walker-rolling, Walker-platform   Walk 50 feet activity   Assist    Assist level: Contact Guard/Touching assist Assistive device: Walker-platform    Walk 150 feet activity   Assist    Assist level: Contact Guard/Touching assist Assistive device: Walker-platform    Walk 10 feet on uneven surface  activity   Assist     Assist level: Minimal Assistance - Patient > 75% Assistive device: Walker-platform   Wheelchair     Assist Is the patient using a wheelchair?: Yes Type of Wheelchair: Manual    Wheelchair assist level: Supervision/Verbal cueing Max wheelchair distance: 40     Wheelchair 50 feet with 2 turns activity    Assist        Assist Level: Supervision/Verbal cueing   Wheelchair 150 feet activity     Assist          Blood pressure 109/64, pulse (!) 109, temperature 98.3 F (36.8 C), temperature source Oral, resp. rate 15, height '6\' 4"'$  (1.93 m), weight 64.3 kg, SpO2 99 %.  Medical Problem List and Plan: 1. Functional deficits secondary to fall with L femoral neck fracture- and L humeral fx-              -patient may  shower- cover L incision             -ELOS/Goals: 5/19,  mod I to supervision--I'm hopeful we can stick to dc tomorrow  -Continue CIR therapies including PT, OT    2.  Antithrombotics: -DVT/anticoagulation:  Pharmaceutical: Lovenox X 4 weeks started              -antiplatelet therapy:  Plavix 3. Pain Management: Hydrocodone prn.   -5/15 Pain well controlled, continues to use hydrocodone    -ice prn left shoulder 4. Mood: LCSW to follow for evaluation and support.              -antipsychotic agents: N/A 5. Neuropsych: This patient is not fully  capable of making decisions on his own behalf. 6. Skin/Wound Care: Routine pressure relief measures             --dc shoulder sutures today  -can dc both dressings to hip/shoulder also 7. Fluids/Electrolytes/Nutrition: still sporadic  5/18 increased megace to bid--he is eating better 8. Left proximal Humerus Fx s/p ORIF- NWB LUE with platform walker per [name]             --passive and active shoulder flex/ext, passive (NO ACTIVE) shoulder abduction             --sling prn and unrestricted ROM L-elbow, forearm, wrist, hand 9. Left femoral neck Fx s/p hemi: WBAT LLE with posterior hip precautions             --Aquacel dressing intact till follow up appt 2 weeks w/Dr. Yetta Numbers 10. Thrombocytopenia: resolved 11. ABLA: Stable post transfusion. Continue to monitor H/H.             --5/16 stable at 9.5 12. Acute on chronic renal failure: BUN/SCR 17/1.34 at  admission-->31/1.38.             -encourage fluid intake.   -Cr stable 13. Depression/Insomnia: Managed with Wellbutrin and Remeron.   -5/18 mood positive 14.  Dementia: Stable on Namenda and Aricept.   -observe for carryover with therapies.  15. T2DM: Monitor BS ac/hs. Use SSI for elevated BS             --  added juven/prostat as nutritional supplement.   CBG (last 3)  Recent Labs    03/24/22 1713 03/24/22 2124 03/25/22 0547  GLUCAP 181* 89 113*    5/18 cover with SSI 16. Urinary retention: Has prostate cancer with mets?             --foley out.  After initially doing well, seems to be having more difficulty emptying consistently  5/18 ua+, empiric keflex started, ucx pending  -change Alfluzosin to PM to avoid hypotension -continue urecholine to help emptying -wbc's up to 12.5k --recheck in AM -up to toilet/eob to void  17. Bowels: moving bowels   LOS: 9 days    A FACE TO FACE EVALUATION WAS PERFORMED  Meredith Staggers 03/25/2022, 9:18 AM

## 2022-03-26 LAB — CBC
HCT: 29 % — ABNORMAL LOW (ref 39.0–52.0)
Hemoglobin: 9.5 g/dL — ABNORMAL LOW (ref 13.0–17.0)
MCH: 33.2 pg (ref 26.0–34.0)
MCHC: 32.8 g/dL (ref 30.0–36.0)
MCV: 101.4 fL — ABNORMAL HIGH (ref 80.0–100.0)
Platelets: 261 10*3/uL (ref 150–400)
RBC: 2.86 MIL/uL — ABNORMAL LOW (ref 4.22–5.81)
RDW: 13.2 % (ref 11.5–15.5)
WBC: 6.5 10*3/uL (ref 4.0–10.5)
nRBC: 0 % (ref 0.0–0.2)

## 2022-03-26 LAB — URINE CULTURE: Culture: >=100000 — AB

## 2022-03-26 LAB — BASIC METABOLIC PANEL
Anion gap: 6 (ref 5–15)
BUN: 27 mg/dL — ABNORMAL HIGH (ref 8–23)
CO2: 24 mmol/L (ref 22–32)
Calcium: 8.6 mg/dL — ABNORMAL LOW (ref 8.9–10.3)
Chloride: 109 mmol/L (ref 98–111)
Creatinine, Ser: 1.45 mg/dL — ABNORMAL HIGH (ref 0.61–1.24)
GFR, Estimated: 50 mL/min — ABNORMAL LOW (ref >60)
Glucose, Bld: 196 mg/dL — ABNORMAL HIGH (ref 70–99)
Potassium: 4.7 mmol/L (ref 3.5–5.1)
Sodium: 139 mmol/L (ref 135–145)

## 2022-03-26 LAB — GLUCOSE, CAPILLARY
Glucose-Capillary: 157 mg/dL — ABNORMAL HIGH (ref 70–99)
Glucose-Capillary: 148 mg/dL — ABNORMAL HIGH (ref 70–99)
Glucose-Capillary: 223 mg/dL — ABNORMAL HIGH (ref 70–99)
Glucose-Capillary: 182 mg/dL — ABNORMAL HIGH (ref 70–99)

## 2022-03-26 MED ORDER — PHENAZOPYRIDINE HCL 100 MG PO TABS
100.0000 mg | ORAL_TABLET | Freq: Two times a day (BID) | ORAL | Status: AC
  Administered 2022-03-26 – 2022-03-28 (×4): 100 mg via ORAL
  Filled 2022-03-26 (×4): qty 1

## 2022-03-26 MED ORDER — BETHANECHOL CHLORIDE 25 MG PO TABS
25.0000 mg | ORAL_TABLET | Freq: Three times a day (TID) | ORAL | Status: DC
Start: 2022-03-26 — End: 2022-03-28
  Administered 2022-03-26 – 2022-03-28 (×7): 25 mg via ORAL
  Filled 2022-03-26 (×7): qty 1

## 2022-03-26 NOTE — Progress Notes (Addendum)
Occupational Therapy Discharge Summary  Patient Details  Name: Kevin [name] MRN: [id number] Date of Birth: [birth year]   Patient with functional decline during rehab stay due to UTI and displaced hardware in L shoulder making patient now immobilized in sling at all times and unable to use platform RW. Patient will require assistance with functional BADL tasks due to these restrictions and recommending only transfer at home with spouse.    Patient has met 7 of 8 long term goals due to improved activity tolerance, improved balance, postural control, ability to compensate for deficits, and improved coordination.  Patient to discharge at overall [place] level.  Patient's care partner is independent to provide the necessary physical and cognitive assistance at discharge.     Recommendation:  Patient will benefit from ongoing skilled OT services in home health setting to continue to advance functional skills in the area of BADL and Reduce care partner burden.  Equipment: wheelchair  Reasons for discharge: treatment goals met and discharge from hospital  Patient/family agrees with progress made and goals achieved: Yes  OT Discharge Precautions/Restrictions  Precautions Precautions: Posterior Hip;Shoulder;Fall Shoulder Interventions: Shoulder sling/immobilizer (at all times) Precaution Comments: NWB R UE, NO SHOULDER ROM, SHOULDER IMMOBILIZED IN SLING AT ALL TIMES Restrictions Weight Bearing Restrictions: Yes LUE Weight Bearing: Non weight bearing LLE Weight Bearing: Weight bearing as tolerated General   Vital Signs Therapy Vitals Temp: 98.1 F (36.7 C) Temp Source: Oral Pulse Rate: 97 Resp: 16 BP: 129/71 Patient Position (if appropriate): Lying Oxygen Therapy SpO2: 100 % O2 Device: Room Air Pain Pain Assessment Pain Scale: 0-10 Pain Score: 6  Pain Type: Acute pain Pain Location: Elbow Pain Orientation: Left Pain Descriptors / Indicators: Aching Pain  Frequency: Intermittent Pain Onset: On-going Patients Stated Pain Goal: 0 Pain Intervention(s): Medication (See eMAR) Multiple Pain Sites: No ADL ADL Eating: Set up Where Assessed-Eating: Bed level Grooming: Setup Where Assessed-Grooming: Standing at sink Upper Body Bathing: Minimal assistance Where Assessed-Upper Body Bathing: Edge of bed Lower Body Bathing: Minimal assistance Where Assessed-Lower Body Bathing: Edge of bed Upper Body Dressing: Minimal assistance Where Assessed-Upper Body Dressing: Edge of bed Lower Body Dressing: Moderate assistance Where Assessed-Lower Body Dressing: Edge of bed Toileting: Minimal assistance Where Assessed-Toileting: Glass blower/designer: Psychiatric nurse Method: Counselling psychologist: Radiographer, therapeutic: Unable to English as a second language teacher: Minimal assistance Perception  Perception: Within Functional Limits Praxis Praxis: Intact Cognition Cognition Overall Cognitive Status: History of cognitive impairments - at baseline Arousal/Alertness: Awake/alert Orientation Level: Person;Place;Situation Person: Oriented Place: Oriented Situation: Oriented Memory: Impaired Memory Impairment: Decreased short term memory;Decreased recall of new information Awareness: Impaired Safety/Judgment: Appears intact Comments: Baseline dementia dx- deficits in STM and higher level cognition Brief Interview for Mental Status (BIMS) Repetition of Three Words (First Attempt): 3 Temporal Orientation: Year: Correct Temporal Orientation: Month: Accurate within 5 days Temporal Orientation: Day: Correct Recall: "Sock": Yes, no cue required Recall: "Blue": Yes, no cue required Recall: "Bed": No, could not recall BIMS Summary Score: 13 Sensation Sensation Light Touch: Appears Intact Hot/Cold: Appears Intact Coordination Gross Motor Movements are Fluid and Coordinated: No Fine Motor Movements are Fluid  and Coordinated: No Coordination and Movement Description: L shoulder fx and L hip- pain guarding and weightbearing restrictions in the LUE Motor  Motor Motor - Skilled Clinical Observations: generalized weaknes Mobility  Transfers Sit to Stand: Minimal Assistance - Patient > 75% Stand to Sit: Minimal Assistance - Patient > 75%  Trunk/Postural Assessment  Balance Static Sitting Balance Static Sitting - Balance Support: Feet supported Static Sitting - Level of Assistance: 5: Stand by assistance Dynamic Sitting Balance Dynamic Sitting - Balance Support: During functional activity;Feet supported Dynamic Sitting - Level of Assistance: 5: Stand by assistance Static Standing Balance Static Standing - Balance Support: During functional activity;No upper extremity supported Static Standing - Level of Assistance: 4: Min assist Dynamic Standing Balance Dynamic Standing - Balance Support: During functional activity Dynamic Standing - Level of Assistance: 4: Min assist Extremity/Trunk Assessment RUE Assessment RUE Assessment: Within Functional Limits LUE Assessment LUE Assessment: Exceptions to [facility] Active Range of Motion (AROM) Comments: NO shoulder ROM at all, L UE immobilized in sling LUE Body System: Kevin [name] 03/26/2022, 3:40 PM

## 2022-03-26 NOTE — Progress Notes (Addendum)
PROGRESS NOTE   Subjective/Complaints: No new complaints today. Left shoulder sore. Xray findings noted. Had to be cathed this morning  ROS: Patient denies fever, rash, sore throat, blurred vision, dizziness, nausea, vomiting, diarrhea, cough, shortness of breath or chest pain,   back/neck pain, headache, or mood change.   Objective:   DG Shoulder Left Port  Result Date: 03/25/2022 CLINICAL DATA:  Follow-up left humeral fracture fixation. EXAM: LEFT SHOULDER COMPARISON:  Radiographs 03/11/2022 FINDINGS: Unfortunately, the left humeral neck fracture has become displaced. I assume this is posttraumatic. I do not see any evidence of fractured hardware but the plate has come off the humeral shaft. The humeral head is normally located. The [facility] joint is intact. No definite acute left sided rib fractures. IMPRESSION: Displaced left humeral neck fracture. The plate has come off the humeral shaft. Electronically Signed   By: Marijo Sanes M.D.   On: 03/25/2022 10:49   Recent Labs    03/24/22 0647 03/26/22 0515  WBC 12.5* 6.5  HGB 9.5* 9.5*  HCT 29.7* 29.0*  PLT 285 261   Recent Labs    03/26/22 0515  NA 139  K 4.7  CL 109  CO2 24  GLUCOSE 196*  BUN 27*  CREATININE 1.45*  CALCIUM 8.6*    Intake/Output Summary (Last 24 hours) at 03/26/2022 1001 Last data filed at 03/26/2022 0839 Gross per 24 hour  Intake 595 ml  Output 2725 ml  Net -2130 ml        Physical Exam: Vital Signs Blood pressure 124/69, pulse 91, temperature 97.6 F (36.4 C), temperature source Oral, resp. rate 15, height '6\' 4"'$  (1.93 m), weight 64.3 kg, SpO2 95 %.  Constitutional: No distress . Vital signs reviewed. HEENT: NCAT, EOMI, oral membranes moist Neck: supple Cardiovascular: RRR without murmur. No JVD    Respiratory/Chest: CTA Bilaterally without wheezes or rales. Normal effort    GI/Abdomen: BS +, non-tender, non-distended Ext: no clubbing,  cyanosis, or edema Psych: pleasant and cooperative  Skin: wounds cdi with sutures left shoulder, left hip CDI with sq suturing Neuro:  Alert and oriented x 3. Normal insight and awareness. Intact Memory. Normal language and speech. Cranial nerve exam unremarkable  Musculoskeletal: left thigh swelling better, left shoulder still tender   Assessment/Plan: 1. Functional deficits which require 3+ hours per day of interdisciplinary therapy in a comprehensive inpatient rehab setting. Physiatrist is providing close team supervision and 24 hour management of active medical problems listed below. Physiatrist and rehab team continue to assess barriers to discharge/monitor patient progress toward functional and medical goals  Care Tool:  Bathing    Body parts bathed by patient: Right arm, Left arm, Chest, Abdomen, Front perineal area, Right upper leg, Left upper leg, Right lower leg, Left lower leg, Face   Body parts bathed by helper: Right lower leg, Left lower leg, Buttocks     Bathing assist Assist Level: Minimal Assistance - Patient > 75%     Upper Body Dressing/Undressing Upper body dressing   What is the patient wearing?: Pull over shirt    Upper body assist Assist Level: Contact Guard/Touching assist    Lower Body Dressing/Undressing Lower body dressing  What is the patient wearing?: Underwear/pull up, Pants     Lower body assist Assist for lower body dressing: Minimal Assistance - Patient > 75%     Toileting Toileting    Toileting assist Assist for toileting: Minimal Assistance - Patient > 75%     Transfers Chair/bed transfer  Transfers assist     Chair/bed transfer assist level: Contact Guard/Touching assist     Locomotion Ambulation   Ambulation assist      Assist level: Contact Guard/Touching assist Assistive device: Walker-platform Max distance: 150   Walk 10 feet activity   Assist     Assist level: Contact Guard/Touching assist Assistive  device: Walker-rolling, Walker-platform   Walk 50 feet activity   Assist    Assist level: Contact Guard/Touching assist Assistive device: Walker-platform    Walk 150 feet activity   Assist    Assist level: Contact Guard/Touching assist Assistive device: Walker-platform    Walk 10 feet on uneven surface  activity   Assist     Assist level: Minimal Assistance - Patient > 75% Assistive device: Walker-platform   Wheelchair     Assist Is the patient using a wheelchair?: Yes Type of Wheelchair: Manual    Wheelchair assist level: Supervision/Verbal cueing Max wheelchair distance: 40    Wheelchair 50 feet with 2 turns activity    Assist        Assist Level: Supervision/Verbal cueing   Wheelchair 150 feet activity     Assist          Blood pressure 124/69, pulse 91, temperature 97.6 F (36.4 C), temperature source Oral, resp. rate 15, height '6\' 4"'$  (1.93 m), weight 64.3 kg, SpO2 95 %.  Medical Problem List and Plan: 1. Functional deficits secondary to fall with L femoral neck fracture- and L humeral fx-              -patient may  shower- cover L incision             -ELOS/Goals: extended to 5/22,  mod I to supervision-   -Continue CIR therapies including PT, OT    2.  Antithrombotics: -DVT/anticoagulation:  Pharmaceutical: Lovenox X 4 weeks started              -antiplatelet therapy:  Plavix 3. Pain Management: Hydrocodone prn.   -5/19 Pain well controlled, continues to use hydrocodone    -ice prn left shoulder 4. Mood: LCSW to follow for evaluation and support.              -antipsychotic agents: N/A 5. Neuropsych: This patient is not fully  capable of making decisions on his own behalf. 6. Skin/Wound Care: Routine pressure relief measures             --dc shoulder sutures today  -can dc both dressings to hip/shoulder also 7. Fluids/Electrolytes/Nutrition: still sporadic  5/19 increased megace to bid--he is eating better 8. Left proximal  Humerus Fx s/p ORIF- NWB LUE with platform walker per [name]             --passive and active shoulder flex/ext, passive (NO ACTIVE) shoulder abduction             --sling at all times given loosening of plate, no intervention recommended 9. Left femoral neck Fx s/p hemi: WBAT LLE with posterior hip precautions             --wound open to air 10. Thrombocytopenia: resolved 11. ABLA: Stable post transfusion. Continue to monitor H/H.             --  5/19 hgb stable at 9.5  12. Acute on chronic renal failure: BUN/SCR 17/1.34 at admission-->31/1.38.             -encourage fluid intake.   -Cr stable 13. Depression/Insomnia: Managed with Wellbutrin and Remeron.   -5/19 mood positive 14.  Dementia: Stable on Namenda and Aricept.   -observe for carryover with therapies.  15. T2DM: Monitor BS ac/hs. Use SSI for elevated BS             --added juven/prostat as nutritional supplement.   CBG (last 3)  Recent Labs    03/25/22 1641 03/25/22 2114 03/26/22 0552  GLUCAP 173* 166* 157*    5/19 cover with SSI, he has been eating much better.    -will change to CM diet 16. Urinary retention: Has prostate cancer with mets?             - After initially doing well, seems to be having more difficulty emptying consistently  5/19 UCX+ for 100k Klebsiella--sens to keflex   -continue keflex for 7 days from 5/18  -changed Alfluzosin to PM to avoid hypotension -increase urecholine to '25mg'$  tid -wbc's back to 6.5 -up to toilet/eob to void, preferably standing -if he's still requiring I/o cath by Monday, may need to go home with foley  17. Bowels: moving bowels   LOS: 10 days    A FACE TO FACE EVALUATION WAS PERFORMED  Meredith Staggers 03/26/2022, 10:01 AM

## 2022-03-26 NOTE — Progress Notes (Signed)
Physical Therapy Weekly Progress Note  Patient Details  Name: Kevin [name] MRN: [id number] Date of Birth: 03-29-1946  Beginning of progress report period: Mar 17, 2022 End of progress report period: Mar 26, 2022  Today's Date: 03/26/2022 PT Individual Time: 1302-1415 PT Individual Time Calculation (min): 73 min  and Today's Date: 03/26/2022 PT Missed Time: 60 Minutes Missed Time Reason: Patient fatigue;Patient unwilling to participate  Patient has met 0 of 0 short term goals. Pt has had functional decline during current reporting period, likely contributed to by UTI, as well as displaced hardware in L upper extremity. Pt had been making slow progress toward mobility goals but last several days have seen pt require more assistance and with increased pain. Pt has had family education and wife is aware of physical and cognitive limitations, as well as requirements for safe mobility following discharge.  Patient continues to demonstrate the following deficits muscle weakness, decreased cardiorespiratoy endurance, decreased awareness, decreased problem solving, decreased safety awareness, and decreased memory, and decreased sitting balance, decreased standing balance, decreased postural control, and decreased balance strategies and therefore will continue to benefit from skilled PT intervention to increase functional independence with mobility.  Patient not progressing toward long term goals.  See goal revision..  Plan of care revisions: Pt's goals downgraded to reflect decline in functional status. Pt LTGs set to minA overall.  PT Short Term Goals Week 1:  PT Short Term Goal 1 (Week 1): STGs = LTGs due to ELOS Week 2:  PT Short Term Goal 1 (Week 2): STGs = LTGs  Skilled Therapeutic Interventions/Progress Updates:  Ambulation/gait training;Community reintegration;DME/adaptive equipment instruction;Neuromuscular re-education;Psychosocial support;Stair training;UE/LE Strength  taining/ROM;Balance/vestibular training;Discharge planning;Functional electrical stimulation;Pain management;Skin care/wound management;Therapeutic Activities;UE/LE Coordination activities;Cognitive remediation/compensation;Disease management/prevention;Patient/family education;Functional mobility training;Splinting/orthotics;Therapeutic Exercise;Visual/perceptual remediation/compensation   1st Session: Pt received supine in bed. Requests to rest at this time secondary to fatigue and "feeling lousy and dizzy". RN aware. PT will follow up as able.  2nd Session: Pt received supine in bed. Agrees to therapy and reports pain in L shoulder. PT provides rest breaks to manage pain. Pt also reports ongoing dizziness. PT wraps both legs with ace wraps to promote increased venous return. Sit to stand with minA and max cueing for anterior trunk lean and weight transition. Stand pivot to WC with minA. WC transport to gym for time management. Pt trials quad cane to prevent WB through L upper extremity. Sit to stand with minA. Pt ambulates x10' with modA and reports feeling very unsteady and dizzy. WC brought for seated rest break. BP 110/73. Pt again trials ambulation and is able to complete x80', initially with modA and improving to minA with cues for upright posture, cane management and sequencing, and increasing gait speed. Pt completes additional 100' following seated rest break. Pt says that he does not like quad cane at all and it feels very unsteady. Pt then trials "hurrycane" and ambulates x100' with modA progressing to minA. Following seated rest break, pt ambulates x150' with hurrycane. Slightly improved gait pattern and confidence with hurrycane vs quad cane. [name] informed. Sit to supine with maxA. Left with alarm intact and all needs within reach.  Therapy Documentation Precautions:  Precautions Precautions: Posterior Hip, Shoulder, Fall Shoulder Interventions: Shoulder sling/immobilizer (at all  times) Precaution Booklet Issued: Yes (comment) Precaution Comments: NWB R UE, NO SHOULDER ROM, SHOULDER IMMOBILIZED IN SLING AT ALL TIMES Required Braces or Orthoses: Sling Restrictions Weight Bearing Restrictions: Yes LUE Weight Bearing: Non weight bearing LLE Weight Bearing: Weight  bearing as tolerated Other Position/Activity Restrictions: posterior hip precautions   Therapy/Group: Individual Therapy  Breck Coons, PT, DPT 03/26/2022, 5:17 PM

## 2022-03-26 NOTE — Progress Notes (Signed)
Occupational Therapy Session Note  Patient Details  Name: Kevin [name] MRN: [id number] Date of Birth: 25-Nov-1945  Today's Date: 03/26/2022 OT Individual Time: 0847-1000 OT Individual Time Calculation (min): 73 min   Short Term Goals: Week 2:  OT Short Term Goal 1 (Week 2): LTG=STG 2/2 ELOS  Skilled Therapeutic Interventions/Progress Updates:    Patient greeted semi reclined in bed and agreeable to OT treatment session focused on functional ADL tasks. Practice bed mobility from flat bed and simulated home environment. Patient with difficulty sitting up, requiring moderate assistance to elevate trunk and scoot to edge of bed. Patient required to trials to stand with posterior lean, but overall minA to get to standing then Min A to transfer to the stronger right side. Attempted to void in standing at the commode with min A to stand and contact guard for balance. Patient then reported need to have a bowel movement and bedside commode brought behind patient. Patient unable to have a bowel movement. Patient then stood and ambulated 5 feet to tub bench in shower with minimal assistance hand held. OT removed waist strap from sling and Sling left on for shower to ensure patient did not use left shoulder. Patient with another dry sling in the room we will donned after bathing completed with OT assist to wash right upper extremity and buttocks.  Min A stand pivot out of shower and patient brought to the sink for dressing tasks. Educated on dressing techniques using handed technique. Patient still has difficulty not moving left shoulder, requiring minimal assistance from OT for upper body dressing and Moderate to maximal assistance for lower body dressing without adaptive equipment. Grooming tasks completed at the sink with set up assist. Then patient ambulated 5 feet to recliner with min handheld assist. Patient left seated in recliner with call bell in reach, alarm bell on, and needs met.   Therapy  Documentation Precautions:  Precautions Precautions: Posterior Hip, Shoulder, Fall Shoulder Interventions: Shoulder sling/immobilizer, For comfort Precaution Booklet Issued: Yes (comment) Precaution Comments: NWB but OK for platform RW, AROM/PROM for shoulder extension/flexion, PROM shoulder abduction (NO ACTIVE ABDUCTION), pendulums OK Required Braces or Orthoses: Sling Restrictions Weight Bearing Restrictions: Yes LUE Weight Bearing: Non weight bearing LLE Weight Bearing: Weight bearing as tolerated Other Position/Activity Restrictions: posterior hip precautions Pain:  Pt reports high pain in L shoulder. Rest and repositioned in sling for comfort.    Therapy/Group: Individual Therapy  Valma Cava 03/26/2022, 10:08 AM

## 2022-03-26 NOTE — Progress Notes (Addendum)
Patient ID: Kevin [name], male   DOB: Sep 17, 1946, 76 y.o.   MRN: [id number]  SW received updates from attending that pt should be able to d/c on Monday as continuing to treat UTI and voiding concerns.   SW left message for pt wife to inform on above. [facility] informed [facility] and [facility] on changes as well.   *[facility] reports alerted prosthetics department that DME needs to be processed quickly.   Per PT, pt now needs wheelchair. SW send new orders to Cassie.   Loralee Pacas, MSW, [place] Office: [phone] Cell: [phone] Fax: [phone]

## 2022-03-27 DIAGNOSIS — S72001A Fracture of unspecified part of neck of right femur, initial encounter for closed fracture: Secondary | ICD-10-CM

## 2022-03-27 DIAGNOSIS — M25512 Pain in left shoulder: Secondary | ICD-10-CM

## 2022-03-27 LAB — GLUCOSE, CAPILLARY
Glucose-Capillary: 139 mg/dL — ABNORMAL HIGH (ref 70–99)
Glucose-Capillary: 210 mg/dL — ABNORMAL HIGH (ref 70–99)
Glucose-Capillary: 124 mg/dL — ABNORMAL HIGH (ref 70–99)
Glucose-Capillary: 192 mg/dL — ABNORMAL HIGH (ref 70–99)

## 2022-03-27 NOTE — Progress Notes (Addendum)
Occupational Therapy Session Note  Patient Details  Name: Kevin [name] MRN: [id number] Date of Birth: [birth year]  Today's Date: 03/27/2022 Session 1 OT Individual Time: 0930-1000 OT Individual Time Calculation (min): 30 min   Session 2 OT Individual Time: 1130-1200 OT Individual Time Calculation (min): 30 min   Short Term Goals: Week 2:  OT Short Term Goal 1 (Week 2): LTG=STG 2/2 ELOS  Skilled Therapeutic Interventions/Progress Updates:  Session 1   Pt finishing up getting cathed. Discussed with nursing trying to stand patient at the commode and truning on shower as pt had success with this the other day in therapy. The visual aid of commode and standing seemed to help. Pt reported pain in L shoulder so nursing administered meds. Pt needed encouragement to get OOB and needed mod A for bed mobility from flat surface. OT assist to don TED hose and socks. Worked on LB dressing using reacher to thread pant legs. Pt with very poor frustration tolerance and needed encouragement and verbal cues to keep trying. Pt eventually was able to thread pants with mod A using reacher. Sit<>stand at EOB with 2 trials and min A, then pt able to get pants up on R side and OT assist to get them up on the L. Pt pivoted to recliner using small based cane and min A. OT set-up for breakfast to cut food and open containers. Pt left seated in recliner with alarm belt on, call bell in reach, and needs met.   Session 2 Pt greeted semi-reclined back in bed. Per nursing, pt was requesting his platform RW. OT educated pt on why we were not using the walker now due to his displaced hardware in his shoulder. OT made sign to hand above bed to remind him of NWB L UE and no ROM. Pt's spouse entered room and session spent educating spouse on BADL modifications using one-handed techniques, posterior hip precautions, sling management, and performing UB ADLs without shoulder movement. OT also discussed home set-up an explained  that therapy was recommending pt go home at wc level with transfers only to decrease risk of falls. Pt will not be able to get to bathroom in wc. Discussed use of BSC and showed pt hair washing tray for sink baths. Also educated on shower sling vs dry sling. Pt's spouse thankful for conversation and verbalized understanding. Pt left semi-reclined in bed with bed alarm on, call bell in reach, and needs met.   Therapy Documentation Precautions:  Precautions Precautions: Posterior Hip, Shoulder, Fall Shoulder Interventions: Shoulder sling/immobilizer (at all times) Precaution Booklet Issued: Yes (comment) Precaution Comments: NWB R UE, NO SHOULDER ROM, SHOULDER IMMOBILIZED IN SLING AT ALL TIMES Required Braces or Orthoses: Sling Restrictions Weight Bearing Restrictions: Yes LUE Weight Bearing: Non weight bearing LLE Weight Bearing: Weight bearing as tolerated Other Position/Activity Restrictions: posterior hip precautions Pain:  Pt reports 2/10 shoulder pain after transfer and after pain meds. Repositioned for comfort with pillow under elbow and hand for elevation and edema control.    Therapy/Group: Individual Therapy  Valma Cava 03/27/2022, 12:22 PM

## 2022-03-27 NOTE — Progress Notes (Signed)
PROGRESS NOTE   Subjective/Complaints: No new complaints this morning  He wants to get back into bed, assisted in calling nursing tech Excited to go home soon  ROS: Patient denies fever, rash, sore throat, blurred vision, dizziness, nausea, vomiting, diarrhea, cough, shortness of breath or chest pain,   back/neck pain, headache, or mood change.   Objective:   No results found. Recent Labs    03/26/22 0515  WBC 6.5  HGB 9.5*  HCT 29.0*  PLT 261   Recent Labs    03/26/22 0515  NA 139  K 4.7  CL 109  CO2 24  GLUCOSE 196*  BUN 27*  CREATININE 1.45*  CALCIUM 8.6*    Intake/Output Summary (Last 24 hours) at 03/27/2022 1358 Last data filed at 03/27/2022 3825 Gross per 24 hour  Intake 240 ml  Output 2425 ml  Net -2185 ml        Physical Exam: Vital Signs Blood pressure 123/71, pulse 99, temperature 98.4 F (36.9 C), temperature source Oral, resp. rate 18, height '6\' 4"'$  (1.93 m), weight 64.3 kg, SpO2 97 %.  Gen: no distress, normal appearing HEENT: oral mucosa pink and moist, NCAT Cardio: Reg rate Chest: normal effort, normal rate of breathing Abd: soft, non-distended  Ext: no clubbing, cyanosis, or edema Psych: pleasant and cooperative  Skin: wounds cdi with sutures left shoulder, left hip CDI with sq suturing Neuro:  Alert and oriented x 3. Normal insight and awareness. Intact Memory. Normal language and speech. Cranial nerve exam unremarkable  Musculoskeletal: left thigh swelling better, left shoulder still tender   Assessment/Plan: 1. Functional deficits which require 3+ hours per day of interdisciplinary therapy in a comprehensive inpatient rehab setting. Physiatrist is providing close team supervision and 24 hour management of active medical problems listed below. Physiatrist and rehab team continue to assess barriers to discharge/monitor patient progress toward functional and medical goals  Care  Tool:  Bathing    Body parts bathed by patient: Left arm, Chest, Abdomen, Front perineal area, Right upper leg, Left upper leg, Right lower leg, Left lower leg, Face   Body parts bathed by helper: Buttocks, Right arm     Bathing assist Assist Level: Minimal Assistance - Patient > 75% Assistive Device Comment: LH sponge   Upper Body Dressing/Undressing Upper body dressing   What is the patient wearing?: Pull over shirt    Upper body assist Assist Level: Minimal Assistance - Patient > 75%    Lower Body Dressing/Undressing Lower body dressing      What is the patient wearing?: Pants     Lower body assist Assist for lower body dressing: Moderate Assistance - Patient 50 - 74% Assistive Device Comment: Automotive engineer    Toileting assist Assist for toileting: Moderate Assistance - Patient 50 - 74%     Transfers Chair/bed transfer  Transfers assist     Chair/bed transfer assist level: Minimal Assistance - Patient > 75%     Locomotion Ambulation   Ambulation assist      Assist level: Contact Guard/Touching assist Assistive device: Walker-platform Max distance: 150   Walk 10 feet activity   Assist     Assist  level: Contact Guard/Touching assist Assistive device: Walker-rolling, Walker-platform   Walk 50 feet activity   Assist    Assist level: Contact Guard/Touching assist Assistive device: Walker-platform    Walk 150 feet activity   Assist    Assist level: Contact Guard/Touching assist Assistive device: Walker-platform    Walk 10 feet on uneven surface  activity   Assist     Assist level: Minimal Assistance - Patient > 75% Assistive device: Walker-platform   Wheelchair     Assist Is the patient using a wheelchair?: Yes Type of Wheelchair: Manual    Wheelchair assist level: Supervision/Verbal cueing Max wheelchair distance: 40    Wheelchair 50 feet with 2 turns activity    Assist        Assist  Level: Supervision/Verbal cueing   Wheelchair 150 feet activity     Assist          Blood pressure 123/71, pulse 99, temperature 98.4 F (36.9 C), temperature source Oral, resp. rate 18, height '6\' 4"'$  (1.93 m), weight 64.3 kg, SpO2 97 %.  Medical Problem List and Plan: 1. Functional deficits secondary to fall with L femoral neck fracture- and L humeral fx-              -patient may  shower- cover L incision             -ELOS/Goals: extended to 5/22,  mod I to supervision-   -Continue CIR therapies including PT, OT    2.  Antithrombotics: -DVT/anticoagulation:  Pharmaceutical: Lovenox X 4 weeks started              -antiplatelet therapy:  Plavix 3. Left shoulder pain: continue Hydrocodone prn.     -ice prn left shoulder 4. Mood: LCSW to follow for evaluation and support.              -antipsychotic agents: N/A 5. Neuropsych: This patient is not fully  capable of making decisions on his own behalf. 6. Skin/Wound Care: Routine pressure relief measures             --dc shoulder sutures today  -can dc both dressings to hip/shoulder also 7. Fluids/Electrolytes/Nutrition: still sporadic  5/19 increased megace to bid--he is eating better 8. Left proximal Humerus Fx s/p ORIF- NWB LUE with platform walker per [name]             --passive and active shoulder flex/ext, passive (NO ACTIVE) shoulder abduction             --sling at all times given loosening of plate, no intervention recommended 9. Left femoral neck Fx s/p hemi: WBAT LLE with posterior hip precautions             --wound open to air 10. Thrombocytopenia: resolved 11. ABLA: Stable post transfusion. Continue to monitor H/H.             --5/19 hgb stable at 9.5  12. Acute on chronic renal failure: BUN/SCR 17/1.34 at admission-->31/1.38.             -encourage fluid intake.   -Cr stable 13. Depression/Insomnia: Managed with Wellbutrin and Remeron.   -5/19 mood positive 14.  Dementia: continue Namenda and Aricept.    -observe for carryover with therapies.  15. T2DM: Monitor BS ac/hs. Use SSI for elevated BS             --added juven/prostat as nutritional supplement.   CBG (last 3)  Recent Labs    03/26/22 2114 03/27/22  0557 03/27/22 1139  GLUCAP 182* 139* 210*    5/19 cover with SSI, he has been eating much better.   -will change to CM diet 16. Urinary retention: Has prostate cancer with mets?             - After initially doing well, seems to be having more difficulty emptying consistently  5/19 UCX+ for 100k Klebsiella--sens to keflex   -continue keflex for 7 days from 5/18  -changed Alfluzosin to PM to avoid hypotension -increase urecholine to '25mg'$  tid -wbc's back to 6.5 -up to toilet/eob to void, preferably standing -if he's still requiring I/o cath by Monday, may need to go home with foley  17. Bowels: moving bowels   LOS: 11 days    A FACE TO FACE EVALUATION WAS PERFORMED  Martha Clan P Krishan Mcbreen 03/27/2022, 1:58 PM

## 2022-03-28 LAB — GLUCOSE, CAPILLARY
Glucose-Capillary: 125 mg/dL — ABNORMAL HIGH (ref 70–99)
Glucose-Capillary: 101 mg/dL — ABNORMAL HIGH (ref 70–99)
Glucose-Capillary: 146 mg/dL — ABNORMAL HIGH (ref 70–99)
Glucose-Capillary: 185 mg/dL — ABNORMAL HIGH (ref 70–99)

## 2022-03-28 MED ORDER — CARVEDILOL 3.125 MG PO TABS
3.1250 mg | ORAL_TABLET | Freq: Every day | ORAL | Status: DC
  Administered 2022-03-28: 3.125 mg via ORAL
  Filled 2022-03-28: qty 1

## 2022-03-28 MED ORDER — BETHANECHOL CHLORIDE 25 MG PO TABS
50.0000 mg | ORAL_TABLET | Freq: Three times a day (TID) | ORAL | Status: DC
  Administered 2022-03-28 – 2022-03-29 (×2): 50 mg via ORAL
  Filled 2022-03-28 (×2): qty 2

## 2022-03-28 NOTE — Progress Notes (Signed)
Occupational Therapy Session Note  Patient Details  Name: Kevin [name] MRN: [id number] Date of Birth: [birth year]  Today's Date: 03/28/2022 OT Individual Time: 0735-0800 OT Individual Time Calculation (min): 25 min  and Today's Date: 03/28/2022 OT Missed Time: 20 Minutes Missed Time Reason: Patient fatigue   Short Term Goals: Week 2:  OT Short Term Goal 1 (Week 2): LTG=STG 2/2 ELOS   Skilled Therapeutic Interventions/Progress Updates:    Pt received supine with no c/o pain, agreeable to OT session. He was initially agreeable to take a shower but once EOB reported he was too tired. He required min A to come EOB with min cueing for adherence to L posterior hip precautions. He required min A to stand d/t posterior lean but was able to correct with min cueing. BP EOB 123/71. In standing he looked slightly pale and reported feeling dizziness and "exhausted". He requested to try and use the urinal and OT positioned it for him. He abruptly sat down and requested to try it himself sitting. He returned to supine despite OT cueing and reported he was too tired to do anything else. He was able to scoot himself up in bed with min cueing for technique. He was left supine with all needs met, L UE immobilizer in place. Bed alarm set.  20 min missed.    Therapy Documentation Precautions:  Precautions Precautions: Posterior Hip, Shoulder, Fall Shoulder Interventions: Shoulder sling/immobilizer (at all times) Precaution Booklet Issued: Yes (comment) Precaution Comments: NWB R UE, NO SHOULDER ROM, SHOULDER IMMOBILIZED IN SLING AT ALL TIMES Required Braces or Orthoses: Sling Restrictions Weight Bearing Restrictions: Yes LUE Weight Bearing: Non weight bearing LLE Weight Bearing: Weight bearing as tolerated Other Position/Activity Restrictions: posterior hip precautions  Therapy/Group: Individual Therapy  Curtis Sites 03/28/2022, 7:24 AM

## 2022-03-28 NOTE — Progress Notes (Signed)
Physical Therapy Discharge Summary  Patient Details  Name: Kevin [name] MRN: [id number] Date of Birth: 1946/10/30  Today's Date: 03/28/2022 PT Individual Time: 1302-1414 PT Individual Time Calculation (min): 72 min    Patient has met 8 of 8 long term goals due to improved activity tolerance, improved balance, improved postural control, increased strength, and decreased pain.  Patient to discharge at an ambulatory level [place].   Patient's care partner is independent to provide the necessary physical and cognitive assistance at discharge.  Reasons goals not met: NA  Recommendation:  Patient will benefit from ongoing skilled PT services in home health setting to continue to advance safe functional mobility, address ongoing impairments in strength, balance, bed mobility, ambulation, and minimize fall risk.  Equipment: Recommended 18" x 18" manual WC  Reasons for discharge: discharge from hospital  Patient/family agrees with progress made and goals achieved: Yes  Skilled therapeutic Interventions: Pt received supine in bed and agrees to therapy. Reports improved pain levels in L shoulder and L hip. Number not provided. Supine to sit with cues for sequencing and body mechanics, requiring minA at trunk. PT provides cueing for effective reciprocal scooting at EOB. Pt performs stand pivot transfer to [facility] with minA and facilitation of anterior trunk lean. Cues as well for increased eccentric control of stand to sit and cues for hand placement. PT ace wraps both legs to the knee to assist with improved venous return. BP taken at 126/67. PT dons pt's shoes and threads legs of pants. Sit to stand with minA HHA and PT provides assist to pull up pants. WC transport to gym for time management. PT re-educates on use of hurrycane versus platform RW. Pt cannot remember why he is not allowed to use platform RW and requires reminder of shoulder fracture and precautions. Sit to stand with minA and  facilitation of anterior trunk lean and initiation. Pt ambulates x200' with hurrycane and minA. PT cues constantly to use wider stance for wider base of support but pt consistently ambulates with very narrow stance and often is almost "heel to toe" walking. Pt also does not utilize hurrycane consistently or correctly, despite cueing and PT demonstration. Following seated rest break, pt completes x8 3" steps with hurrycane and cueing for sequencing and step placement. Pt transfers to mat table with minA and cues for sequencing. Sit to supne with cues for positioning. Pt completes supine therex for bilateral lower extremity strengthening, including 2x15 ankle pumps, quad/glute sets, SAQs, SLRs, hip abduction. Supine to sit with cues for logrolling and positioning. Pt performs stand pivot transfer from mat to [facility] with modA due to heavy posterior lean. WC transport back to room. Ambulatory transfer to toilet with minA and cues for positioning. Pt is continent of bowel and PT provides pericare in standing. Pt ambulates back to bed. Sit to supine without physical assistance. Left with alarm intact and all needs within reach. PT provides wife with HEP printout.  PT Discharge Precautions/Restrictions Precautions Precautions: Posterior Hip;Shoulder;Fall Shoulder Interventions: Shoulder sling/immobilizer (at all times) Precaution Comments: NWB R UE, NO SHOULDER ROM, SHOULDER IMMOBILIZED IN SLING AT ALL TIMES Required Braces or Orthoses: Sling Restrictions Weight Bearing Restrictions: Yes LUE Weight Bearing: Non weight bearing LLE Weight Bearing: Weight bearing as tolerated Pain Interference Pain Interference Pain Effect on Sleep: 1. Rarely or not at all Pain Interference with Therapy Activities: 1. Rarely or not at all Pain Interference with Day-to-Day Activities: 1. Rarely or not at all Vision/Perception  Vision - History Ability  to See in Adequate Light: 0 Adequate Perception Perception: Within  Functional Limits Praxis Praxis: Intact  Cognition Overall Cognitive Status: History of cognitive impairments - at baseline Arousal/Alertness: Awake/alert Orientation Level: Oriented to person;Oriented to place Year: 2023 Month: May Day of Week: Incorrect Selective Attention: Appears intact Memory: Impaired Memory Impairment: Decreased short term memory;Decreased recall of new information Problem Solving: Appears intact Safety/Judgment: Appears intact Comments: Baseline dementia dx- deficits in STM and higher level cognition Sensation Sensation Light Touch: Appears Intact Coordination Gross Motor Movements are Fluid and Coordinated: No Fine Motor Movements are Fluid and Coordinated: No Coordination and Movement Description: L shoulder fx and L hip- pain guarding and weightbearing restrictions in the LUE Motor  Motor Motor: Other (comment) Motor - Skilled Clinical Observations: generalized weaknes  Mobility   Locomotion  Gait Ambulation: Yes Gait Assistance: Minimal Assistance - Patient > 75% Gait Distance (Feet): 200 Feet Assistive device: Other (Comment) (hurrycane) Gait Assistance Details: Tactile cues for posture;Verbal cues for sequencing;Verbal cues for gait pattern;Verbal cues for technique;Verbal cues for precautions/safety;Tactile cues for sequencing Gait Gait: Yes Gait Pattern: Impaired Gait Pattern: Decreased stride length;Narrow base of support Gait velocity: decreased Stairs / Additional Locomotion Stairs: Yes Stairs Assistance: Minimal Assistance - Patient > 75% Stair Management Technique: Other (comment) (hurrycane) Number of Stairs: 8 Height of Stairs: 3 Ramp: Minimal Assistance - Patient >75% Curb: Minimal Assistance - Patient >75% Wheelchair Mobility Wheelchair Mobility: No  Trunk/Postural Assessment  Cervical Assessment Cervical Assessment: Exceptions to [facility] (forward head) Thoracic Assessment Thoracic Assessment: Exceptions to [facility] (rounded  shoulders) Lumbar Assessment Lumbar Assessment: Exceptions to [facility] (posterior pelvic tilt) Postural Control Postural Control: Deficits on evaluation Righting Reactions: delayed  Balance Balance Balance Assessed: Yes Static Sitting Balance Static Sitting - Balance Support: Feet supported Static Sitting - Level of Assistance: 5: Stand by assistance Dynamic Sitting Balance Dynamic Sitting - Balance Support: During functional activity;Feet supported Dynamic Sitting - Level of Assistance: 5: Stand by assistance Static Standing Balance Static Standing - Balance Support: During functional activity;Right upper extremity supported Static Standing - Level of Assistance: 4: Min assist Dynamic Standing Balance Dynamic Standing - Balance Support: During functional activity;Right upper extremity supported Dynamic Standing - Level of Assistance: 4: Min assist Extremity Assessment  RLE Assessment RLE Assessment: Exceptions to [facility] Passive Range of Motion (PROM) Comments: Lacking 10-15 degrees full knee extension General Strength Comments: Grossly 4+/5 LLE Assessment LLE Assessment: Exceptions to [facility] Passive Range of Motion (PROM) Comments: Posterior Hip Precautions General Strength Comments: Not formally tested secondary to Hip Fx. Grossly 3+/5 per functional assessment.    Breck Coons, PT, DPT 03/28/2022, 3:43 PM

## 2022-03-28 NOTE — Progress Notes (Signed)
PROGRESS NOTE   Subjective/Complaints: No new complaints this morning Would like to go home Requiring I/O caths- bladder scan 665 currently  ROS: Patient denies fever, rash, sore throat, blurred vision, dizziness, nausea, vomiting, diarrhea, cough, shortness of breath or chest pain,   back/neck pain, headache, or mood change. +urinary retention  Objective:   No results found. Recent Labs    03/26/22 0515  WBC 6.5  HGB 9.5*  HCT 29.0*  PLT 261   Recent Labs    03/26/22 0515  NA 139  K 4.7  CL 109  CO2 24  GLUCOSE 196*  BUN 27*  CREATININE 1.45*  CALCIUM 8.6*    Intake/Output Summary (Last 24 hours) at 03/28/2022 1932 Last data filed at 03/28/2022 1659 Gross per 24 hour  Intake 225 ml  Output 2355 ml  Net -2130 ml        Physical Exam: Vital Signs Blood pressure 110/76, pulse (!) 106, temperature 98.7 F (37.1 C), resp. rate 19, height '6\' 4"'$  (1.93 m), weight 64.3 kg, SpO2 98 %.  Gen: no distress, normal appearing HEENT: oral mucosa pink and moist, NCAT Cardio: Tachycardia Chest: normal effort, normal rate of breathing Abd: soft, non-distended  Ext: no clubbing, cyanosis, or edema Psych: pleasant and cooperative  Skin: wounds cdi with sutures left shoulder, left hip CDI with sq suturing Neuro:  Alert and oriented x 3. Normal insight and awareness. Intact Memory. Normal language and speech. Cranial nerve exam unremarkable  Musculoskeletal: left thigh swelling better, left shoulder still tender   Assessment/Plan: 1. Functional deficits which require 3+ hours per day of interdisciplinary therapy in a comprehensive inpatient rehab setting. Physiatrist is providing close team supervision and 24 hour management of active medical problems listed below. Physiatrist and rehab team continue to assess barriers to discharge/monitor patient progress toward functional and medical goals  Care Tool:  Bathing     Body parts bathed by patient: Left arm, Chest, Abdomen, Front perineal area, Right upper leg, Left upper leg, Right lower leg, Left lower leg, Face   Body parts bathed by helper: Buttocks, Right arm     Bathing assist Assist Level: Minimal Assistance - Patient > 75% Assistive Device Comment: LH sponge   Upper Body Dressing/Undressing Upper body dressing   What is the patient wearing?: Pull over shirt    Upper body assist Assist Level: Minimal Assistance - Patient > 75%    Lower Body Dressing/Undressing Lower body dressing      What is the patient wearing?: Pants     Lower body assist Assist for lower body dressing: Moderate Assistance - Patient 50 - 74% Assistive Device Comment: Automotive engineer    Toileting assist Assist for toileting: Moderate Assistance - Patient 50 - 74%     Transfers Chair/bed transfer  Transfers assist     Chair/bed transfer assist level: Minimal Assistance - Patient > 75%     Locomotion Ambulation   Ambulation assist      Assist level: Minimal Assistance - Patient > 75% Assistive device: Other (comment) (hurrycane) Max distance: 150'   Walk 10 feet activity   Assist     Assist level: Minimal Assistance -  Patient > 75% Assistive device: Other (comment) (hurrycane)   Walk 50 feet activity   Assist    Assist level: Minimal Assistance - Patient > 75% Assistive device: Other (comment) (hurrycane)    Walk 150 feet activity   Assist    Assist level: Minimal Assistance - Patient > 75% Assistive device: Other (comment) (hurrycane)    Walk 10 feet on uneven surface  activity   Assist     Assist level: Minimal Assistance - Patient > 75% Assistive device: Other (comment) (hurrycane)   Wheelchair     Assist Is the patient using a wheelchair?: Yes Type of Wheelchair: Manual    Wheelchair assist level: Dependent - Patient 0% Max wheelchair distance: 150'    Wheelchair 50 feet with 2 turns  activity    Assist        Assist Level: Dependent - Patient 0%   Wheelchair 150 feet activity     Assist      Assist Level: Dependent - Patient 0%   Blood pressure 110/76, pulse (!) 106, temperature 98.7 F (37.1 C), resp. rate 19, height '6\' 4"'$  (1.93 m), weight 64.3 kg, SpO2 98 %.  Medical Problem List and Plan: 1. Functional deficits secondary to fall with L femoral neck fracture- and L humeral fx-              -patient may  shower- cover L incision             -ELOS/Goals: extended to 5/22,  mod I to supervision-   -Continue CIR therapies including PT, OT    2.  Antithrombotics: -DVT/anticoagulation:  Pharmaceutical: Lovenox X 4 weeks started              -antiplatelet therapy:  Plavix 3. Left shoulder pain: continue Hydrocodone prn.     -ice prn left shoulder 4. Mood: LCSW to follow for evaluation and support.              -antipsychotic agents: N/A 5. Neuropsych: This patient is not fully  capable of making decisions on his own behalf. 6. Skin/Wound Care: Routine pressure relief measures             --dc shoulder sutures today  -can dc both dressings to hip/shoulder also 7. Fluids/Electrolytes/Nutrition: still sporadic  5/19 increased megace to bid--he is eating better 8. Left proximal Humerus Fx s/p ORIF- NWB LUE with platform walker per [name]             --passive and active shoulder flex/ext, passive (NO ACTIVE) shoulder abduction             --sling at all times given loosening of plate, no intervention recommended 9. Left femoral neck Fx s/p hemi: WBAT LLE with posterior hip precautions             --wound open to air 10. Thrombocytopenia: resolved 11. ABLA: Stable post transfusion. Continue to monitor H/H.             --5/19 hgb stable at 9.5  12. Acute on chronic renal failure: BUN/SCR 17/1.34 at admission-->31/1.38.             -encourage fluid intake.   -Cr stable 13. Depression/Insomnia: Managed with Wellbutrin and Remeron.   -5/19 mood  positive 14.  Dementia: continue Namenda and Aricept.   -observe for carryover with therapies.  15. T2DM: Monitor BS ac/hs. Use SSI for elevated BS             --added  juven/prostat as nutritional supplement.   CBG (last 3)  Recent Labs    03/28/22 0559 03/28/22 1137 03/28/22 1708  GLUCAP 125* 101* 146*    5/19 cover with SSI, he has been eating much better.   -will change to CM diet 16. Urinary retention: Has prostate cancer with mets?             - After initially doing well, seems to be having more difficulty emptying consistently  5/19 UCX+ for 100k Klebsiella--sens to keflex   -continue keflex for 7 days from 5/18  -changed Alfluzosin to PM to avoid hypotension -increase urecholine to '50mg'$  tid -wbc's back to 6.5 -up to toilet/eob to void, preferably standing -if he's still requiring I/o cath by Monday, may need to go home with foley  17. Bowels: moving bowels, continue Senokot-S 18. Tachycardia: Coreg 3.'125mg'$  started HS  LOS: 12 days    A FACE TO FACE EVALUATION WAS PERFORMED  Clide Deutscher Zahriyah Joo 03/28/2022, 7:32 PM

## 2022-03-29 DIAGNOSIS — S72001S Fracture of unspecified part of neck of right femur, sequela: Secondary | ICD-10-CM

## 2022-03-29 LAB — BASIC METABOLIC PANEL
Anion gap: 6 (ref 5–15)
BUN: 27 mg/dL — ABNORMAL HIGH (ref 8–23)
CO2: 22 mmol/L (ref 22–32)
Calcium: 8.9 mg/dL (ref 8.9–10.3)
Chloride: 111 mmol/L (ref 98–111)
Creatinine, Ser: 1.35 mg/dL — ABNORMAL HIGH (ref 0.61–1.24)
GFR, Estimated: 54 mL/min — ABNORMAL LOW (ref >60)
Glucose, Bld: 124 mg/dL — ABNORMAL HIGH (ref 70–99)
Potassium: 4.4 mmol/L (ref 3.5–5.1)
Sodium: 139 mmol/L (ref 135–145)

## 2022-03-29 LAB — CBC
HCT: 32.1 % — ABNORMAL LOW (ref 39.0–52.0)
Hemoglobin: 10.2 g/dL — ABNORMAL LOW (ref 13.0–17.0)
MCH: 32 pg (ref 26.0–34.0)
MCHC: 31.8 g/dL (ref 30.0–36.0)
MCV: 100.6 fL — ABNORMAL HIGH (ref 80.0–100.0)
Platelets: 324 10*3/uL (ref 150–400)
RBC: 3.19 MIL/uL — ABNORMAL LOW (ref 4.22–5.81)
RDW: 12.9 % (ref 11.5–15.5)
WBC: 9 10*3/uL (ref 4.0–10.5)
nRBC: 0 % (ref 0.0–0.2)

## 2022-03-29 LAB — GLUCOSE, CAPILLARY
Glucose-Capillary: 120 mg/dL — ABNORMAL HIGH (ref 70–99)
Glucose-Capillary: 104 mg/dL — ABNORMAL HIGH (ref 70–99)

## 2022-03-29 MED ORDER — MEGESTROL ACETATE 400 MG/10ML PO SUSP: 400.0000 mg | Freq: Two times a day (BID) | ORAL | 0 refills | Status: DC

## 2022-03-29 MED ORDER — SENNOSIDES-DOCUSATE SODIUM 8.6-50 MG PO TABS: 2.0000 | ORAL_TABLET | Freq: Every day | ORAL | 0 refills | Status: DC

## 2022-03-29 MED ORDER — TRAZODONE HCL 50 MG PO TABS: 25.0000 mg | ORAL_TABLET | Freq: Every evening | ORAL | 0 refills | Status: DC | PRN

## 2022-03-29 MED ORDER — CEPHALEXIN 500 MG PO CAPS: 500.0000 mg | ORAL_CAPSULE | Freq: Two times a day (BID) | ORAL | 0 refills | Status: DC

## 2022-03-29 MED ORDER — CARVEDILOL 3.125 MG PO TABS: 3.1250 mg | ORAL_TABLET | Freq: Every day | ORAL | 0 refills | Status: DC

## 2022-03-29 MED ORDER — POLYETHYLENE GLYCOL 3350 17 G PO PACK: 17.0000 g | PACK | Freq: Every day | ORAL | 0 refills | Status: DC | PRN

## 2022-03-29 MED ORDER — HYDROCODONE-ACETAMINOPHEN 5-325 MG PO TABS
1.0000 | ORAL_TABLET | Freq: Four times a day (QID) | ORAL | 0 refills | Status: DC | PRN
Start: 2022-03-29 — End: 2022-05-15

## 2022-03-29 MED ORDER — FINASTERIDE 5 MG PO TABS: 5.0000 mg | ORAL_TABLET | Freq: Every day | ORAL | 0 refills | Status: DC

## 2022-03-29 MED ORDER — LORAZEPAM 0.5 MG PO TABS: 0.5000 mg | ORAL_TABLET | Freq: Every day | ORAL | 0 refills | Status: DC | PRN

## 2022-03-29 MED ORDER — DOCUSATE SODIUM 100 MG PO CAPS: 100.0000 mg | ORAL_CAPSULE | Freq: Two times a day (BID) | ORAL | 0 refills | Status: DC

## 2022-03-29 MED ORDER — BUPROPION HCL ER (XL) 150 MG PO TB24: 150.0000 mg | ORAL_TABLET | Freq: Every day | ORAL | Status: DC

## 2022-03-29 MED ORDER — FINASTERIDE 5 MG PO TABS
5.0000 mg | ORAL_TABLET | Freq: Every day | ORAL | Status: DC
  Administered 2022-03-29: 5 mg via ORAL
  Filled 2022-03-29: qty 1

## 2022-03-29 MED ORDER — ALFUZOSIN HCL ER 10 MG PO TB24: 10.0000 mg | ORAL_TABLET | Freq: Every day | ORAL | 0 refills | Status: DC

## 2022-03-29 MED ORDER — ENOXAPARIN SODIUM 40 MG/0.4ML IJ SOSY: 40.0000 mg | PREFILLED_SYRINGE | INTRAMUSCULAR | 0 refills | Status: DC

## 2022-03-29 NOTE — Progress Notes (Signed)
Orthopedic Tech Progress Note Patient Details:  Kevin [name] March 06, 1946 [id number]  Ortho Devices Type of Ortho Device: Arm sling Ortho Device/Splint Location: lue Ortho Device/Splint Interventions: Ordered   Post Interventions Patient Tolerated: Well Instructions Provided: Care of device  Janit Pagan 03/29/2022, 12:12 PM

## 2022-03-29 NOTE — Progress Notes (Signed)
PROGRESS NOTE   Subjective/Complaints: Continues to require caths every 6. No pain. No other complaints  ROS: Patient denies fever, rash, sore throat, blurred vision, dizziness, nausea, vomiting, diarrhea, cough, shortness of breath or chest pain, joint or back/neck pain, headache, or mood change.   Objective:   No results found. Recent Labs    03/29/22 0630  WBC 9.0  HGB 10.2*  HCT 32.1*  PLT 324   Recent Labs    03/29/22 0630  NA 139  K 4.4  CL 111  CO2 22  GLUCOSE 124*  BUN 27*  CREATININE 1.35*  CALCIUM 8.9    Intake/Output Summary (Last 24 hours) at 03/29/2022 1638 Last data filed at 03/29/2022 0600 Gross per 24 hour  Intake 85 ml  Output 1455 ml  Net -1370 ml        Physical Exam: Vital Signs Blood pressure 122/69, pulse 88, temperature 98.2 F (36.8 C), resp. rate 16, height '6\' 4"'$  (1.93 m), weight 64.3 kg, SpO2 95 %.  Constitutional: No distress . Vital signs reviewed. HEENT: NCAT, EOMI, oral membranes moist Neck: supple Cardiovascular: RRR without murmur. No JVD    Respiratory/Chest: CTA Bilaterally without wheezes or rales. Normal effort    GI/Abdomen: BS +, non-tender, non-distended Ext: no clubbing, cyanosis, or edema Psych: pleasant and cooperative  Skin: wounds cdi with sutures left shoulder, left hip CDI with sq suturing Neuro:  Alert and oriented x 3. Normal insight and awareness. Intact Memory. Normal language and speech. Cranial nerve exam unremarkable  Musculoskeletal: left thigh swelling better, left shoulder still tender   Assessment/Plan: 1. Functional deficits which require 3+ hours per day of interdisciplinary therapy in a comprehensive inpatient rehab setting. Physiatrist is providing close team supervision and 24 hour management of active medical problems listed below. Physiatrist and rehab team continue to assess barriers to discharge/monitor patient progress toward  functional and medical goals  Care Tool:  Bathing    Body parts bathed by patient: Left arm, Chest, Abdomen, Front perineal area, Right upper leg, Left upper leg, Right lower leg, Left lower leg, Face   Body parts bathed by helper: Buttocks, Right arm     Bathing assist Assist Level: Minimal Assistance - Patient > 75% Assistive Device Comment: LH sponge   Upper Body Dressing/Undressing Upper body dressing   What is the patient wearing?: Pull over shirt    Upper body assist Assist Level: Minimal Assistance - Patient > 75%    Lower Body Dressing/Undressing Lower body dressing      What is the patient wearing?: Pants     Lower body assist Assist for lower body dressing: Moderate Assistance - Patient 50 - 74% Assistive Device Comment: Automotive engineer    Toileting assist Assist for toileting: Moderate Assistance - Patient 50 - 74%     Transfers Chair/bed transfer  Transfers assist     Chair/bed transfer assist level: Minimal Assistance - Patient > 75%     Locomotion Ambulation   Ambulation assist      Assist level: Minimal Assistance - Patient > 75% Assistive device: Other (comment) (hurrycane) Max distance: 150'   Walk 10 feet activity   Assist  Assist level: Minimal Assistance - Patient > 75% Assistive device: Other (comment) (hurrycane)   Walk 50 feet activity   Assist    Assist level: Minimal Assistance - Patient > 75% Assistive device: Other (comment) (hurrycane)    Walk 150 feet activity   Assist    Assist level: Minimal Assistance - Patient > 75% Assistive device: Other (comment) (hurrycane)    Walk 10 feet on uneven surface  activity   Assist     Assist level: Minimal Assistance - Patient > 75% Assistive device: Other (comment) (hurrycane)   Wheelchair     Assist Is the patient using a wheelchair?: Yes Type of Wheelchair: Manual    Wheelchair assist level: Dependent - Patient 0% Max wheelchair  distance: 150'    Wheelchair 50 feet with 2 turns activity    Assist        Assist Level: Dependent - Patient 0%   Wheelchair 150 feet activity     Assist      Assist Level: Dependent - Patient 0%   Blood pressure 122/69, pulse 88, temperature 98.2 F (36.8 C), resp. rate 16, height '6\' 4"'$  (1.93 m), weight 64.3 kg, SpO2 95 %.  Medical Problem List and Plan: 1. Functional deficits secondary to fall with L femoral neck fracture- and L humeral fx-              -patient may  shower- cover L incision             -dc home today    2.  Antithrombotics: -DVT/anticoagulation:  Pharmaceutical: Lovenox X 4 weeks started              -antiplatelet therapy:  Plavix 3. Left shoulder pain: continue Hydrocodone prn.     -ice prn left shoulder 4. Mood: LCSW to follow for evaluation and support.              -antipsychotic agents: N/A 5. Neuropsych: This patient is not fully  capable of making decisions on his own behalf. 6. Skin/Wound Care: Routine pressure relief measures             --dc shoulder sutures today  -can dc both dressings to hip/shoulder also 7. Fluids/Electrolytes/Nutrition: still sporadic  5/19 increased megace to bid--he is eating better 8. Left proximal Humerus Fx s/p ORIF- NWB LUE with platform walker per [name]             --passive and active shoulder flex/ext, passive (NO ACTIVE) shoulder abduction             -5/22-sling at all times given loosening of plate, no intervention recommended 9. Left femoral neck Fx s/p hemi: WBAT LLE with posterior hip precautions             --wound open to air 10. Thrombocytopenia: resolved 11. ABLA: Stable post transfusion. Continue to monitor H/H.             --5/19 hgb stable at 9.5  12. Acute on chronic renal failure: BUN/SCR 17/1.34 at admission-->31/1.38.             -encourage fluid intake.   -Cr stable 13. Depression/Insomnia: Managed with Wellbutrin and Remeron.   -5/22 mood positive 14.  Dementia: continue  Namenda and Aricept.   -observe for carryover with therapies.  15. T2DM: Monitor BS ac/hs. Use SSI for elevated BS             --added juven/prostat as nutritional supplement.   CBG (last 3)  Recent Labs    03/28/22 1708 03/28/22 2124 03/29/22 0629  GLUCAP 146* 185* 120*    5/22    -better with CM diet-->f/u with primary 16. Urinary retention: Has prostate cancer with mets?             - After initially doing well, seems to be having more difficulty emptying consistently  5/22 UCX+ for 100k Klebsiella    -continue keflex for 7 days from 5/18  -place foley cath-->outpt urology f/u  -changed Alfluzosin to PM to avoid hypotension -dc urecholine -begin proscar '5mg'$  daily -wbc's back to 6.5 -up to toilet/eob to void, preferably standing    17. Bowels: moving bowels, continue Senokot-S 18. Tachycardia: Coreg 3.'125mg'$  started HS  LOS: 13 days    A FACE TO FACE EVALUATION WAS PERFORMED  Meredith Staggers 03/29/2022, 9:22 AM

## 2022-03-29 NOTE — Progress Notes (Signed)
Patient ID: Kevin [name], male   DOB: 03/15/1946, 76 y.o.   MRN: [id number]  SW updated Jason/[facility] on pt d/c.  SW received updates from [facility] that order for w/c was sent to vendor and waiting for w/c delivery to be scheduled.  Loralee Pacas, MSW, [place] Office: [phone] Cell: [phone] Fax: [phone]

## 2022-03-29 NOTE — Plan of Care (Signed)
  Problem: RH Balance Goal: LTG Patient will maintain dynamic standing with ADLs (OT) Description: LTG:  Patient will maintain dynamic standing balance with assist during activities of daily living (OT)  Outcome: Completed/Met   Problem: Sit to Stand Goal: LTG:  Patient will perform sit to stand in prep for activites of daily living with assistance level (OT) Description: LTG:  Patient will perform sit to stand in prep for activites of daily living with assistance level (OT) Outcome: Completed/Met   Problem: RH Bathing Goal: LTG Patient will bathe all body parts with assist levels (OT) Description: LTG: Patient will bathe all body parts with assist levels (OT) Outcome: Completed/Met   Problem: RH Dressing Goal: LTG Patient will perform upper body dressing (OT) Description: LTG Patient will perform upper body dressing with assist, with/without cues (OT). Outcome: Completed/Met Goal: LTG Patient will perform lower body dressing w/assist (OT) Description: LTG: Patient will perform lower body dressing with assist, with/without cues in positioning using equipment (OT) Outcome: Completed/Met   Problem: RH Toileting Goal: LTG Patient will perform toileting task (3/3 steps) with assistance level (OT) Description: LTG: Patient will perform toileting task (3/3 steps) with assistance level (OT)  Outcome: Not Met (add Reason) Note: Mod A for toileting due to lack of progress -ESD 5/22   Problem: RH Toilet Transfers Goal: LTG Patient will perform toilet transfers w/assist (OT) Description: LTG: Patient will perform toilet transfers with assist, with/without cues using equipment (OT) Outcome: Completed/Met   Problem: RH Tub/Shower Transfers Goal: LTG Patient will perform tub/shower transfers w/assist (OT) Description: LTG: Patient will perform tub/shower transfers with assist, with/without cues using equipment (OT) Outcome: Completed/Met

## 2022-03-29 NOTE — Discharge Summary (Signed)
Physician Discharge Summary  Patient ID: Kevin [name] MRN: [id number] DOB/AGE: 01-10-1946 76 y.o.  Admit date: 03/16/2022 Discharge date: 03/29/2022  Discharge Diagnoses:  Principal Problem:   Hip fracture requiring operative repair [facility]) Active Problems:   Urinary retention with incomplete bladder emptying   Fracture of neck of left humerus, closed, initial encounter   Discharged Condition: good  Significant Diagnostic Studies: DG Elbow 2 Views Left  Result Date: 03/09/2022 CLINICAL DATA:  [place], deformity EXAM: LEFT ELBOW - 2 VIEW COMPARISON:  None Available. FINDINGS: Frontal, oblique, lateral views of the left elbow are obtained. No acute displaced fracture, subluxation, or dislocation. Mild osteoarthritis. No joint effusion. Soft tissues are unremarkable. IMPRESSION: 1. Mild osteoarthritis.  No acute fracture. Electronically Signed   By: Randa Ngo M.D.   On: 03/09/2022 22:47   CT HEAD WO CONTRAST  Result Date: 03/09/2022 CLINICAL DATA:  [place], head trauma EXAM: CT HEAD WITHOUT CONTRAST TECHNIQUE: Contiguous axial images were obtained from the base of the skull through the vertex without intravenous contrast. RADIATION DOSE REDUCTION: This exam was performed according to the departmental dose-optimization program which includes automated exposure control, adjustment of the mA and/or kV according to patient size and/or use of iterative reconstruction technique. COMPARISON:  11/06/2021 FINDINGS: Brain: No acute infarct or hemorrhage. Stable chronic ischemic changes within the left cerebellar hemisphere and left occipital cortex. Lateral ventricles and midline structures are stable. No acute extra-axial fluid collections. No mass effect. Vascular: Stable atherosclerosis.  No hyperdense vessel. Skull: Normal. Negative for fracture or focal lesion. Sinuses/Orbits: No acute finding. Other: None. IMPRESSION: 1. No acute intracranial process. Electronically Signed   By: Randa Ngo M.D.    On: 03/09/2022 22:41   CT CERVICAL SPINE WO CONTRAST  Result Date: 03/09/2022 CLINICAL DATA:  [place], trauma EXAM: CT CERVICAL SPINE WITHOUT CONTRAST TECHNIQUE: Multidetector CT imaging of the cervical spine was performed without intravenous contrast. Multiplanar CT image reconstructions were also generated. RADIATION DOSE REDUCTION: This exam was performed according to the departmental dose-optimization program which includes automated exposure control, adjustment of the mA and/or kV according to patient size and/or use of iterative reconstruction technique. COMPARISON:  11/05/2021 FINDINGS: Alignment: Alignment is grossly anatomic. Skull base and vertebrae: No acute fracture. No primary bone lesion or focal pathologic process. Soft tissues and spinal canal: No prevertebral fluid or swelling. No visible canal hematoma. Disc levels: Stable multilevel spondylosis and facet hypertrophy. 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 Right predominant neural foraminal narrowing at C4-5 unchanged. Upper chest: Airway is patent. Emphysematous changes are seen at the lung apices. Other: Reconstructed images demonstrate no additional findings. IMPRESSION: 1. Stable multilevel spondylosis and facet hypertrophy. No acute cervical spine fracture. Electronically Signed   By: Randa Ngo M.D.   On: 03/09/2022 22:44   DG Chest Port 1 View  Result Date: 03/09/2022 CLINICAL DATA:  [place], left humeral fracture EXAM: PORTABLE CHEST 1 VIEW COMPARISON:  01/07/2022 FINDINGS: Single frontal view of the chest demonstrates stable multi lead pacer/AICD. Cardiac silhouette is unremarkable. No airspace disease, effusion, or pneumothorax. Prominent skin folds overlie the mid and lower lung zones bilaterally. Partial visualization of the proximal left humeral fracture. No other acute bony abnormalities. IMPRESSION: 1. No acute intrathoracic process. 2. Partial visualization of proximal left  humeral fracture. Please refer to dedicated left shoulder evaluation. Electronically Signed   By: Randa Ngo M.D.   On: 03/09/2022 22:48   DG Shoulder Left  Result Date: 03/11/2022 CLINICAL  DATA:  ORIF proximal LEFT humerus EXAM: LEFT SHOULDER - 2+ VIEW COMPARISON:  03/09/2022 Fluoroscopy time: 0 minutes 25 seconds Dose: 2.46 mGy FINDINGS: Images demonstrate placement of a plate with multiple screws across a reduced fracture of the surgical neck of the LEFT humerus. Bones demineralized. IMPRESSION: Post ORIF proximal LEFT humeral fracture. Electronically Signed   By: Lavonia Dana M.D.   On: 03/11/2022 15:34   DG Shoulder Left  Result Date: 03/09/2022 CLINICAL DATA:  Status post fall. EXAM: LEFT SHOULDER - 2+ VIEW COMPARISON:  None Available. FINDINGS: An acute fracture deformity is seen extending through the surgical neck and proximal shaft of the left humerus. Approximately 1 shaft width medial displacement of the distal fracture site is seen. There is no evidence of dislocation. Moderate severity degenerative changes are seen involving the left acromioclavicular joint and left glenohumeral articulation. Lateral soft tissue swelling is noted. IMPRESSION: Acute fracture of the proximal left humerus. Electronically Signed   By: Virgina Norfolk M.D.   On: 03/09/2022 22:17   DG Shoulder Left Port  Result Date: 03/25/2022 CLINICAL DATA:  Follow-up left humeral fracture fixation. EXAM: LEFT SHOULDER COMPARISON:  Radiographs 03/11/2022 FINDINGS: Unfortunately, the left humeral neck fracture has become displaced. I assume this is posttraumatic. I do not see any evidence of fractured hardware but the plate has come off the humeral shaft. The humeral head is normally located. The [facility] joint is intact. No definite acute left sided rib fractures. IMPRESSION: Displaced left humeral neck fracture. The plate has come off the humeral shaft. Electronically Signed   By: Marijo Sanes M.D.   On: 03/25/2022 10:49   DG  Shoulder Left Port  Result Date: 03/11/2022 CLINICAL DATA:  Post surgery EXAM: LEFT SHOULDER COMPARISON:  None Available. FINDINGS: Interval postsurgical changes plate and screw fixation of proximal left humerus fracture. Hardware components appear in their expected alignment. No periprosthetic fracture is identified. Expected postoperative changes within the overlying soft tissues. Visualized lungs are clear, partially visualized cardiac conduction device. IMPRESSION: Post ORIF of the proximal left humerus. Electronically Signed   By: Yetta Glassman M.D.   On: 03/11/2022 16:09   DG C-Arm 1-60 Min-No Report  Result Date: 03/11/2022 Fluoroscopy was utilized by the requesting physician.  No radiographic interpretation.   DG C-Arm 1-60 Min-No Report  Result Date: 03/11/2022 Fluoroscopy was utilized by the requesting physician.  No radiographic interpretation.   CUP PACEART REMOTE DEVICE CHECK  Result Date: 03/02/2022 Scheduled remote reviewed. Normal device function.  Optivol fluid index peaked since 01/19/22, thoracic impedance markedly below daily reference. Routing for further review. Next remote 91 days- JJB/CVRS  DG HIP UNILAT W OR W/O PELVIS 2-3 VIEWS LEFT  Result Date: 03/10/2022 CLINICAL DATA:  Status post left hip replacement EXAM: DG HIP (WITH OR WITHOUT PELVIS) 2-3V LEFT COMPARISON:  Films from earlier in the same day. FINDINGS: Left hip hemiarthroplasty is noted in satisfactory position. No acute bony abnormality is seen. No soft tissue changes are noted. IMPRESSION: Status post left hip hemiarthroplasty.  No acute abnormality noted. Electronically Signed   By: Inez Catalina M.D.   On: 03/10/2022 22:06   DG Hip Unilat W or Wo Pelvis 2-3 Views Left  Result Date: 03/09/2022 CLINICAL DATA:  Fall. EXAM: DG HIP (WITH OR WITHOUT PELVIS) 2-3V LEFT COMPARISON:  None Available. FINDINGS: The bones are osteopenic. There is an acute fracture through the left femoral neck. There is superolateral  displacement of the distal fracture fragment. There is no evidence for dislocation.  Surgical sutures are seen in the right hemipelvis. There are degenerative changes of the lower lumbar spine. IMPRESSION: 1. Displaced left femoral neck fracture. Electronically Signed   By: Ronney Asters M.D.   On: 03/09/2022 22:16   DG FEMUR MIN 2 VIEWS LEFT  Result Date: 03/10/2022 CLINICAL DATA:  Fall.  Hip fracture EXAM: LEFT FEMUR 2 VIEWS COMPARISON:  Pelvis 03/09/2022 FINDINGS: Left femoral neck fracture with angulation unchanged from the prior study. Left hip joint normal. No other fracture. IMPRESSION: Angulated fracture left femoral neck unchanged from yesterday. Electronically Signed   By: Franchot Gallo M.D.   On: 03/10/2022 13:42    Labs:  Basic Metabolic Panel: Recent Labs  Lab 03/26/22 0515 03/29/22 0630  NA 139 139  K 4.7 4.4  CL 109 111  CO2 24 22  GLUCOSE 196* 124*  BUN 27* 27*  CREATININE 1.45* 1.35*  CALCIUM 8.6* 8.9    CBC: Recent Labs  Lab 03/24/22 0647 03/26/22 0515 03/29/22 0630  WBC 12.5* 6.5 9.0  HGB 9.5* 9.5* 10.2*  HCT 29.7* 29.0* 32.1*  MCV 103.8* 101.4* 100.6*  PLT 285 261 324    CBG: Recent Labs  Lab 03/28/22 0559 03/28/22 1137 03/28/22 1708 03/28/22 2124 03/29/22 0629  GLUCAP 125* 101* 146* 185* 120*    Brief HPI:   TAIDEN RAYBOURN is a 76 y.o. male with history of T2DM, dementia, agent orange exposure, prostate cancer, FTT, recent fall with SDH 10/2021 who was admitted on 03/09/2022 after a fall due to syncope with onset of left shoulder and left hip pain.  He was found to have left humeral head and left femoral neck fractures.  He was made n.p.o. and underwent left hemiarthroplasty on 05/02 with recommendations to be WBAT with posterior hip precautions and Lovenox for DVT prophylaxis.  He was also evaluated by [name] and underwent ORIF left shoulder with clearance to weight-bear on left forearm for platform walker.    Hospital course was significant  for acute blood loss anemia, thrombocytopenia, acute on chronic renal failure as well as issues with urinary retention requiring Foley placement.  Lovenox was added on 05/07 and platelets were improving and he was transfused with 1 units PRBC.  Pain was noted to be reasonably controlled and he reported that his shoulder/arm hurt more than left hip overall.  PT OT was working with patient and patient was limited by pain, weakness as well as decrease in awareness of deficits.  CIR was recommended due to functional decline.   Hospital Course: BRAINARD HIGHFILL was admitted to rehab 03/16/2022 for inpatient therapies to consist of PT, ST and OT at least three hours five days a week. Past admission physiatrist, therapy team and rehab RN have worked together to provide customized collaborative inpatient rehab.  He was maintained on subcu Lovenox for DVT prophylaxis and is to continue this for 10 additional days after discharge.   Left hip dressing was kept in place for 2 weeks per Ortho input.  Follow-up CBC shows improvement in H&H and leukocytosis has resolved.  His blood pressures were monitored on TID basis and has been stable.   Megace was added to help stimulate diet and he was maintained on Modified diet.  His diabetes has been monitored with ac/hs CBG checks and SSI was use prn for tighter BS control.  Foley was removed past admission and voiding was monitored with PVR checks.  He was initially voiding without signs of retention but then started having issues with  retention again.  Uroxatrol was added to help with voiding function and then changed to evenings to avoid orthostatic symptoms.  Urine culture ordered for work-up on 05/17 and showed Klebsiella UTI therefore he was started on Keflex for 7 total days of antibiotic therapy.  Foley was placed to straight drain at discharge and he with urology for voiding trial in the future.  Staples left shoulder were removed on postop day 14.  He was noted to  develop increasing edema and some pain left shoulder.  Follow-up shoulder x-rays showed the plate had come off humeral shaft with displaced left humeral neck fracture.  Ortho recommended strict nonweightbearing with sling to be worn at all times.  Patient's goals were downgraded and walker was changed to hurrycane to prevent weightbearing through LUE.  His pain has been controlled with as needed use of hydrocodone.  Due to restrictions on LUE he requires min assist with transfers and transitional movements.  He will continue to receive home health PT, OT, ST and aide by [facility] after discharge.   Rehab course: During patient's stay in rehab weekly team conferences were held to monitor patient's progress, set goals and discuss barriers to discharge. At admission, patient required min assist with mobility and with basic ADL tasks. He  has had improvement in activity tolerance, balance, postural control as well as ability to compensate for deficits. He requires min to mod assist with ADL tasks. He requires min assist with transfers and to ambulate 200' with hurrycane.  Family education was completed with wife    Discharge disposition: 01-Home or Self Care  Diet: Regular  Special Instructions: No weight on left arm. Wear sling at all times except for hygeine Continue left total hip precautions.   Allergies as of 03/29/2022       Reactions   Sulfa Antibiotics Swelling   Swelling of lips only.   5-alpha Reductase Inhibitors    Patient and wife state OK for patient to take finasteride - has not taken it before, NKA        Medication List     STOP taking these medications    acetaminophen 325 MG tablet Commonly known as: TYLENOL   diphenhydrAMINE 25 MG tablet Commonly known as: BENADRYL   loratadine 10 MG tablet Commonly known as: CLARITIN   magnesium gluconate 500 MG tablet Commonly known as: MAGONATE   ondansetron 4 MG tablet Commonly known as: ZOFRAN       TAKE  these medications    alfuzosin 10 MG 24 hr tablet Commonly known as: UROXATRAL Take 1 tablet (10 mg total) by mouth daily with supper.   atorvastatin 80 MG tablet Commonly known as: LIPITOR Take 1 tablet (80 mg total) by mouth daily. Notes to patient: Start tomorrow   buPROPion 150 MG 24 hr tablet Commonly known as: WELLBUTRIN XL Take 1 tablet (150 mg total) by mouth daily.   carvedilol 3.125 MG tablet Commonly known as: COREG Take 1 tablet (3.125 mg total) by mouth at bedtime.   cephALEXin 500 MG capsule Commonly known as: KEFLEX Take 1 capsule (500 mg total) by mouth every 12 (twelve) hours.   cholecalciferol 25 MCG (1000 UNIT) tablet Commonly known as: VITAMIN D Take 1 tablet (1,000 Units total) by mouth daily.   clopidogrel 75 MG tablet Commonly known as: PLAVIX Take 75 mg by mouth daily.   docusate sodium 100 MG capsule Commonly known as: COLACE Take 1 capsule (100 mg total) by mouth 2 (two) times daily.  donepezil 5 MG tablet Commonly known as: ARICEPT Take 1 tablet (5 mg total) by mouth daily.   enoxaparin 40 MG/0.4ML injection Commonly known as: LOVENOX Inject 0.4 mLs (40 mg total) into the skin daily.   EYE DROPS OP Place 1 drop into both eyes 3 (three) times daily. Given by [facility] doctor to help astigmtism   feeding supplement Liqd Take 237 mLs by mouth 2 (two) times daily between meals.   finasteride 5 MG tablet Commonly known as: PROSCAR Take 1 tablet (5 mg total) by mouth daily.   FreeStyle Libre 14 Day Sensor Misc SMARTSIG:1 Each Topical Every 2 Weeks   HYDROcodone-acetaminophen 5-325 MG tablet--Rx# 36 pills. Commonly known as: NORCO/VICODIN Take 1-2 tablets by mouth every 6 (six) hours as needed for moderate pain or severe pain (pain score 4-6).   LORazepam 0.5 MG tablet Commonly known as: ATIVAN Take 1 tablet (0.5 mg total) by mouth daily as needed for anxiety.   megestrol 400 MG/10ML suspension Commonly known as: MEGACE Take 10 mLs (400  mg total) by mouth 2 (two) times daily. 30 minutes before lunch and supper   memantine 10 MG tablet Commonly known as: NAMENDA Take 1 tablet twice a day What changed:  how much to take how to take this when to take this additional instructions   mirtazapine 15 MG disintegrating tablet Commonly known as: REMERON SOL-TAB Take 1 tablet (15 mg total) by mouth at bedtime. What changed: additional instructions   multivitamin with minerals Tabs tablet Take 1 tablet by mouth daily.   polyethylene glycol 17 g packet Commonly known as: MIRALAX / GLYCOLAX Take 17 g by mouth daily as needed. What changed:  when to take this reasons to take this   senna-docusate 8.6-50 MG tablet Commonly known as: Senokot-S Take 2 tablets by mouth daily after supper. What changed:  how much to take when to take this   traZODone 50 MG tablet Commonly known as: DESYREL Take 0.5-1 tablets (25-50 mg total) by mouth at bedtime as needed for sleep.        Follow-up Information     Meredith Staggers, MD. Call.   Specialty: Physical Medicine and Rehabilitation Why: As needed Contact information: [address] [place] [address] [phone]         Altamese Macy, MD. Call today.   Specialty: Orthopedic Surgery Why: for appointment in 7-10 days Contact information: [place] [address] [phone]         Willaim Sheng, MD Follow up.   Specialty: Orthopedic Surgery Why: call for follow up appointment Contact information: [address] [place] [address] [phone]         Karen Kays, NP Follow up on 04/09/2022.   Specialty: Nurse Practitioner Why: Be there at 8:30 for 8:45 am appointment (foley removal/voiding trial). You HAVE to KEEP THIS APPOINTMENT or reschedule if needed. Contact information: [address] [place] [address] [phone]                 Signed: Bary Leriche 03/29/2022, 11:13 AM

## 2022-03-29 NOTE — Progress Notes (Signed)
Inpatient Rehabilitation Care Coordinator Discharge Note   Patient Details  Name: ANSEL FERRALL MRN: [id number] Date of Birth: [birth year]   Discharge location: D/c to home  Length of Stay: 12 days  Discharge activity level: Min Asst  Home/community participation: Limited  Patient response JG:GEZMOQ Literacy - How often do you need to have someone help you when you read instructions, pamphlets, or other written material from your doctor or pharmacy?: Never  Patient response HU:TMLYYT Isolation - How often do you feel lonely or isolated from those around you?: Never  Services provided included: MD, RD, PT, OT, SLP, CM, SW, Neuropsych, Pharmacy, TR, Financial risk analyst Services:  Charity fundraiser Utilized: [place] offered to/list presented to: Yes  Follow-up services arranged:  DME, [place]: [facility] for HHPT/OT/SLP/aide    DME : W/c- ordered by [facility] and item will be scheduled for delivery to be sent to home.    Patient response to transportation need: Is the patient able to respond to transportation needs?: Yes In the past 12 months, has lack of transportation kept you from medical appointments or from getting medications?: No In the past 12 months, has lack of transportation kept you from meetings, work, or from getting things needed for daily living?: No   Comments (or additional information):  Patient/Family verbalized understanding of follow-up arrangements:  Yes  Individual responsible for coordination of the follow-up plan: contact pt wife [name]  Confirmed correct DME delivered: Rana Snare 03/29/2022    Rana Snare

## 2022-04-01 ENCOUNTER — Encounter: Payer: Self-pay | Admitting: Family Medicine

## 2022-04-02 ENCOUNTER — Telehealth: Payer: Self-pay

## 2022-04-02 DIAGNOSIS — S42212D Unspecified displaced fracture of surgical neck of left humerus, subsequent encounter for fracture with routine healing: Secondary | ICD-10-CM | POA: Diagnosis not present

## 2022-04-02 DIAGNOSIS — I130 Hypertensive heart and chronic kidney disease with heart failure and stage 1 through stage 4 chronic kidney disease, or unspecified chronic kidney disease: Secondary | ICD-10-CM | POA: Diagnosis not present

## 2022-04-02 DIAGNOSIS — I959 Hypotension, unspecified: Secondary | ICD-10-CM | POA: Diagnosis not present

## 2022-04-02 DIAGNOSIS — Y798 Miscellaneous orthopedic devices associated with adverse incidents, not elsewhere classified: Secondary | ICD-10-CM | POA: Diagnosis not present

## 2022-04-02 DIAGNOSIS — S42212K Unspecified displaced fracture of surgical neck of left humerus, subsequent encounter for fracture with nonunion: Secondary | ICD-10-CM | POA: Diagnosis not present

## 2022-04-02 DIAGNOSIS — Z950 Presence of cardiac pacemaker: Secondary | ICD-10-CM | POA: Diagnosis not present

## 2022-04-02 DIAGNOSIS — M25552 Pain in left hip: Secondary | ICD-10-CM | POA: Diagnosis not present

## 2022-04-02 DIAGNOSIS — Z9181 History of falling: Secondary | ICD-10-CM | POA: Diagnosis not present

## 2022-04-02 DIAGNOSIS — Y939 Activity, unspecified: Secondary | ICD-10-CM | POA: Diagnosis not present

## 2022-04-02 DIAGNOSIS — I951 Orthostatic hypotension: Secondary | ICD-10-CM | POA: Diagnosis not present

## 2022-04-02 DIAGNOSIS — M25551 Pain in right hip: Secondary | ICD-10-CM | POA: Diagnosis not present

## 2022-04-02 DIAGNOSIS — Z743 Need for continuous supervision: Secondary | ICD-10-CM | POA: Diagnosis not present

## 2022-04-02 DIAGNOSIS — M25512 Pain in left shoulder: Secondary | ICD-10-CM | POA: Diagnosis not present

## 2022-04-02 DIAGNOSIS — R42 Dizziness and giddiness: Secondary | ICD-10-CM | POA: Diagnosis not present

## 2022-04-02 DIAGNOSIS — S42202K Unspecified fracture of upper end of left humerus, subsequent encounter for fracture with nonunion: Secondary | ICD-10-CM | POA: Diagnosis not present

## 2022-04-02 DIAGNOSIS — N390 Urinary tract infection, site not specified: Secondary | ICD-10-CM | POA: Diagnosis not present

## 2022-04-02 DIAGNOSIS — R339 Retention of urine, unspecified: Secondary | ICD-10-CM | POA: Diagnosis not present

## 2022-04-02 DIAGNOSIS — I639 Cerebral infarction, unspecified: Secondary | ICD-10-CM | POA: Diagnosis not present

## 2022-04-02 DIAGNOSIS — Z7984 Long term (current) use of oral hypoglycemic drugs: Secondary | ICD-10-CM | POA: Diagnosis not present

## 2022-04-02 DIAGNOSIS — Z7902 Long term (current) use of antithrombotics/antiplatelets: Secondary | ICD-10-CM | POA: Diagnosis not present

## 2022-04-02 DIAGNOSIS — Y92019 Unspecified place in single-family (private) house as the place of occurrence of the external cause: Secondary | ICD-10-CM | POA: Diagnosis not present

## 2022-04-02 DIAGNOSIS — Y929 Unspecified place or not applicable: Secondary | ICD-10-CM | POA: Diagnosis not present

## 2022-04-02 DIAGNOSIS — E785 Hyperlipidemia, unspecified: Secondary | ICD-10-CM | POA: Diagnosis not present

## 2022-04-02 DIAGNOSIS — I498 Other specified cardiac arrhythmias: Secondary | ICD-10-CM | POA: Diagnosis not present

## 2022-04-02 DIAGNOSIS — R000 Tachycardia, unspecified: Secondary | ICD-10-CM | POA: Diagnosis not present

## 2022-04-02 DIAGNOSIS — R55 Syncope and collapse: Secondary | ICD-10-CM | POA: Diagnosis not present

## 2022-04-02 DIAGNOSIS — F0390 Unspecified dementia without behavioral disturbance: Secondary | ICD-10-CM | POA: Diagnosis not present

## 2022-04-02 DIAGNOSIS — M25511 Pain in right shoulder: Secondary | ICD-10-CM | POA: Diagnosis not present

## 2022-04-02 DIAGNOSIS — R531 Weakness: Secondary | ICD-10-CM | POA: Diagnosis not present

## 2022-04-02 DIAGNOSIS — Z79899 Other long term (current) drug therapy: Secondary | ICD-10-CM | POA: Diagnosis not present

## 2022-04-02 DIAGNOSIS — N179 Acute kidney failure, unspecified: Secondary | ICD-10-CM | POA: Diagnosis not present

## 2022-04-02 DIAGNOSIS — T84121A Displacement of internal fixation device of left humerus, initial encounter: Secondary | ICD-10-CM | POA: Diagnosis not present

## 2022-04-02 DIAGNOSIS — B961 Klebsiella pneumoniae [K. pneumoniae] as the cause of diseases classified elsewhere: Secondary | ICD-10-CM | POA: Diagnosis not present

## 2022-04-02 DIAGNOSIS — E118 Type 2 diabetes mellitus with unspecified complications: Secondary | ICD-10-CM | POA: Diagnosis not present

## 2022-04-02 DIAGNOSIS — T83511A Infection and inflammatory reaction due to indwelling urethral catheter, initial encounter: Secondary | ICD-10-CM | POA: Diagnosis not present

## 2022-04-02 DIAGNOSIS — R627 Adult failure to thrive: Secondary | ICD-10-CM | POA: Diagnosis not present

## 2022-04-02 DIAGNOSIS — E1122 Type 2 diabetes mellitus with diabetic chronic kidney disease: Secondary | ICD-10-CM | POA: Diagnosis not present

## 2022-04-02 DIAGNOSIS — I5020 Unspecified systolic (congestive) heart failure: Secondary | ICD-10-CM | POA: Diagnosis not present

## 2022-04-02 DIAGNOSIS — R69 Illness, unspecified: Secondary | ICD-10-CM | POA: Diagnosis not present

## 2022-04-02 DIAGNOSIS — F0393 Unspecified dementia, unspecified severity, with mood disturbance: Secondary | ICD-10-CM | POA: Diagnosis not present

## 2022-04-02 DIAGNOSIS — W1839XA Other fall on same level, initial encounter: Secondary | ICD-10-CM | POA: Diagnosis not present

## 2022-04-02 DIAGNOSIS — G8911 Acute pain due to trauma: Secondary | ICD-10-CM | POA: Diagnosis not present

## 2022-04-02 DIAGNOSIS — N1831 Chronic kidney disease, stage 3a: Secondary | ICD-10-CM | POA: Diagnosis not present

## 2022-04-02 DIAGNOSIS — E119 Type 2 diabetes mellitus without complications: Secondary | ICD-10-CM | POA: Diagnosis not present

## 2022-04-02 DIAGNOSIS — I5022 Chronic systolic (congestive) heart failure: Secondary | ICD-10-CM | POA: Diagnosis not present

## 2022-04-02 DIAGNOSIS — Z681 Body mass index (BMI) 19 or less, adult: Secondary | ICD-10-CM | POA: Diagnosis not present

## 2022-04-02 DIAGNOSIS — S42292D Other displaced fracture of upper end of left humerus, subsequent encounter for fracture with routine healing: Secondary | ICD-10-CM | POA: Diagnosis not present

## 2022-04-02 DIAGNOSIS — W19XXXA Unspecified fall, initial encounter: Secondary | ICD-10-CM | POA: Diagnosis not present

## 2022-04-02 DIAGNOSIS — Z7982 Long term (current) use of aspirin: Secondary | ICD-10-CM | POA: Diagnosis not present

## 2022-04-02 DIAGNOSIS — F39 Unspecified mood [affective] disorder: Secondary | ICD-10-CM | POA: Diagnosis not present

## 2022-04-02 DIAGNOSIS — R296 Repeated falls: Secondary | ICD-10-CM | POA: Diagnosis not present

## 2022-04-02 DIAGNOSIS — Z960 Presence of urogenital implants: Secondary | ICD-10-CM | POA: Diagnosis not present

## 2022-04-02 NOTE — Telephone Encounter (Signed)
Kevin [name] thinks Kevin [name] may have re-injured his shoulder. She has been advised to call Dr. Redgie Grayer office (Ortho) or PCP Dr. Erick Blinks office for evaluation.ASAP.  Call back phone [phone].

## 2022-04-08 ENCOUNTER — Other Ambulatory Visit: Payer: Self-pay | Admitting: Physical Medicine and Rehabilitation

## 2022-04-10 DIAGNOSIS — I495 Sick sinus syndrome: Secondary | ICD-10-CM | POA: Diagnosis not present

## 2022-04-10 DIAGNOSIS — Z79899 Other long term (current) drug therapy: Secondary | ICD-10-CM | POA: Diagnosis not present

## 2022-04-10 DIAGNOSIS — E1169 Type 2 diabetes mellitus with other specified complication: Secondary | ICD-10-CM | POA: Diagnosis not present

## 2022-04-10 DIAGNOSIS — Z4789 Encounter for other orthopedic aftercare: Secondary | ICD-10-CM | POA: Diagnosis not present

## 2022-04-10 DIAGNOSIS — S72042A Displaced fracture of base of neck of left femur, initial encounter for closed fracture: Secondary | ICD-10-CM | POA: Diagnosis not present

## 2022-04-10 DIAGNOSIS — F419 Anxiety disorder, unspecified: Secondary | ICD-10-CM | POA: Diagnosis not present

## 2022-04-10 DIAGNOSIS — R627 Adult failure to thrive: Secondary | ICD-10-CM | POA: Diagnosis not present

## 2022-04-10 DIAGNOSIS — G9341 Metabolic encephalopathy: Secondary | ICD-10-CM | POA: Diagnosis not present

## 2022-04-10 DIAGNOSIS — W19XXXA Unspecified fall, initial encounter: Secondary | ICD-10-CM | POA: Diagnosis not present

## 2022-04-10 DIAGNOSIS — S065XAA Traumatic subdural hemorrhage with loss of consciousness status unknown, initial encounter: Secondary | ICD-10-CM | POA: Diagnosis not present

## 2022-04-10 DIAGNOSIS — R531 Weakness: Secondary | ICD-10-CM | POA: Diagnosis not present

## 2022-04-10 DIAGNOSIS — N183 Chronic kidney disease, stage 3 unspecified: Secondary | ICD-10-CM | POA: Diagnosis not present

## 2022-04-10 DIAGNOSIS — R278 Other lack of coordination: Secondary | ICD-10-CM | POA: Diagnosis not present

## 2022-04-10 DIAGNOSIS — R69 Illness, unspecified: Secondary | ICD-10-CM | POA: Diagnosis not present

## 2022-04-10 DIAGNOSIS — Z95810 Presence of automatic (implantable) cardiac defibrillator: Secondary | ICD-10-CM | POA: Diagnosis not present

## 2022-04-10 DIAGNOSIS — Z96642 Presence of left artificial hip joint: Secondary | ICD-10-CM | POA: Diagnosis not present

## 2022-04-10 DIAGNOSIS — A630 Anogenital (venereal) warts: Secondary | ICD-10-CM | POA: Diagnosis not present

## 2022-04-10 DIAGNOSIS — R41841 Cognitive communication deficit: Secondary | ICD-10-CM | POA: Diagnosis not present

## 2022-04-10 DIAGNOSIS — I951 Orthostatic hypotension: Secondary | ICD-10-CM | POA: Diagnosis not present

## 2022-04-10 DIAGNOSIS — I5042 Chronic combined systolic (congestive) and diastolic (congestive) heart failure: Secondary | ICD-10-CM | POA: Diagnosis not present

## 2022-04-10 DIAGNOSIS — E559 Vitamin D deficiency, unspecified: Secondary | ICD-10-CM | POA: Diagnosis not present

## 2022-04-10 DIAGNOSIS — I10 Essential (primary) hypertension: Secondary | ICD-10-CM | POA: Diagnosis not present

## 2022-04-10 DIAGNOSIS — E785 Hyperlipidemia, unspecified: Secondary | ICD-10-CM | POA: Diagnosis not present

## 2022-04-10 DIAGNOSIS — S72042D Displaced fracture of base of neck of left femur, subsequent encounter for closed fracture with routine healing: Secondary | ICD-10-CM | POA: Diagnosis not present

## 2022-04-10 DIAGNOSIS — K603 Anal fistula: Secondary | ICD-10-CM | POA: Diagnosis not present

## 2022-04-10 DIAGNOSIS — Z66 Do not resuscitate: Secondary | ICD-10-CM | POA: Diagnosis not present

## 2022-04-10 DIAGNOSIS — R488 Other symbolic dysfunctions: Secondary | ICD-10-CM | POA: Diagnosis not present

## 2022-04-10 DIAGNOSIS — G4700 Insomnia, unspecified: Secondary | ICD-10-CM | POA: Diagnosis not present

## 2022-04-10 DIAGNOSIS — E43 Unspecified severe protein-calorie malnutrition: Secondary | ICD-10-CM | POA: Diagnosis not present

## 2022-04-10 DIAGNOSIS — Z4802 Encounter for removal of sutures: Secondary | ICD-10-CM | POA: Diagnosis not present

## 2022-04-10 DIAGNOSIS — R262 Difficulty in walking, not elsewhere classified: Secondary | ICD-10-CM | POA: Diagnosis not present

## 2022-04-10 DIAGNOSIS — M6281 Muscle weakness (generalized): Secondary | ICD-10-CM | POA: Diagnosis not present

## 2022-04-10 DIAGNOSIS — Z743 Need for continuous supervision: Secondary | ICD-10-CM | POA: Diagnosis not present

## 2022-04-10 DIAGNOSIS — S42202D Unspecified fracture of upper end of left humerus, subsequent encounter for fracture with routine healing: Secondary | ICD-10-CM | POA: Diagnosis not present

## 2022-04-10 DIAGNOSIS — R339 Retention of urine, unspecified: Secondary | ICD-10-CM | POA: Diagnosis not present

## 2022-04-10 DIAGNOSIS — C61 Malignant neoplasm of prostate: Secondary | ICD-10-CM | POA: Diagnosis not present

## 2022-04-11 DIAGNOSIS — C61 Malignant neoplasm of prostate: Secondary | ICD-10-CM | POA: Diagnosis not present

## 2022-04-11 DIAGNOSIS — S42202D Unspecified fracture of upper end of left humerus, subsequent encounter for fracture with routine healing: Secondary | ICD-10-CM | POA: Diagnosis not present

## 2022-04-11 DIAGNOSIS — Z66 Do not resuscitate: Secondary | ICD-10-CM | POA: Diagnosis not present

## 2022-04-11 DIAGNOSIS — R339 Retention of urine, unspecified: Secondary | ICD-10-CM | POA: Diagnosis not present

## 2022-04-11 DIAGNOSIS — S72042A Displaced fracture of base of neck of left femur, initial encounter for closed fracture: Secondary | ICD-10-CM | POA: Diagnosis not present

## 2022-04-13 DIAGNOSIS — Z79899 Other long term (current) drug therapy: Secondary | ICD-10-CM | POA: Diagnosis not present

## 2022-04-13 DIAGNOSIS — E559 Vitamin D deficiency, unspecified: Secondary | ICD-10-CM | POA: Diagnosis not present

## 2022-04-16 DIAGNOSIS — C61 Malignant neoplasm of prostate: Secondary | ICD-10-CM | POA: Diagnosis not present

## 2022-04-16 DIAGNOSIS — S72042A Displaced fracture of base of neck of left femur, initial encounter for closed fracture: Secondary | ICD-10-CM | POA: Diagnosis not present

## 2022-04-16 DIAGNOSIS — I951 Orthostatic hypotension: Secondary | ICD-10-CM | POA: Diagnosis not present

## 2022-04-25 ENCOUNTER — Other Ambulatory Visit: Payer: Self-pay | Admitting: Physical Medicine and Rehabilitation

## 2022-04-27 ENCOUNTER — Non-Acute Institutional Stay: Payer: Medicare HMO

## 2022-04-28 ENCOUNTER — Encounter: Payer: Medicare HMO | Admitting: Psychology

## 2022-04-28 ENCOUNTER — Encounter: Payer: Self-pay | Admitting: Psychology

## 2022-04-28 DIAGNOSIS — R339 Retention of urine, unspecified: Secondary | ICD-10-CM | POA: Diagnosis not present

## 2022-04-28 DIAGNOSIS — S42202D Unspecified fracture of upper end of left humerus, subsequent encounter for fracture with routine healing: Secondary | ICD-10-CM | POA: Diagnosis not present

## 2022-04-28 DIAGNOSIS — R69 Illness, unspecified: Secondary | ICD-10-CM | POA: Diagnosis not present

## 2022-04-28 DIAGNOSIS — I951 Orthostatic hypotension: Secondary | ICD-10-CM | POA: Diagnosis not present

## 2022-04-28 DIAGNOSIS — Z4802 Encounter for removal of sutures: Secondary | ICD-10-CM | POA: Diagnosis not present

## 2022-04-28 DIAGNOSIS — Z029 Encounter for administrative examinations, unspecified: Secondary | ICD-10-CM

## 2022-04-28 DIAGNOSIS — S72042A Displaced fracture of base of neck of left femur, initial encounter for closed fracture: Secondary | ICD-10-CM | POA: Diagnosis not present

## 2022-05-05 ENCOUNTER — Encounter: Payer: Medicare HMO | Admitting: Psychology

## 2022-05-07 DIAGNOSIS — R000 Tachycardia, unspecified: Secondary | ICD-10-CM | POA: Diagnosis not present

## 2022-05-07 DIAGNOSIS — Z743 Need for continuous supervision: Secondary | ICD-10-CM | POA: Diagnosis not present

## 2022-05-09 DIAGNOSIS — I951 Orthostatic hypotension: Secondary | ICD-10-CM | POA: Diagnosis not present

## 2022-05-09 DIAGNOSIS — Z8781 Personal history of (healed) traumatic fracture: Secondary | ICD-10-CM | POA: Diagnosis not present

## 2022-05-09 DIAGNOSIS — R69 Illness, unspecified: Secondary | ICD-10-CM | POA: Diagnosis not present

## 2022-05-09 DIAGNOSIS — Z9889 Other specified postprocedural states: Secondary | ICD-10-CM | POA: Diagnosis not present

## 2022-05-09 DIAGNOSIS — R339 Retention of urine, unspecified: Secondary | ICD-10-CM | POA: Diagnosis not present

## 2022-05-12 ENCOUNTER — Telehealth: Payer: Self-pay | Admitting: Family Medicine

## 2022-05-12 NOTE — Telephone Encounter (Signed)
Sharyn Lull from [facility] call and stated she need a verbal orders for skill nursing 1 wk 9 .

## 2022-05-13 ENCOUNTER — Inpatient Hospital Stay (HOSPITAL_COMMUNITY)
Admission: EM | Admit: 2022-05-13 | Discharge: 2022-05-15 | DRG: 177 | Disposition: A | Discharge status: Hospice | Payer: No Typology Code available for payment source | Attending: Internal Medicine | Admitting: Internal Medicine

## 2022-05-13 ENCOUNTER — Other Ambulatory Visit: Payer: Self-pay

## 2022-05-13 ENCOUNTER — Emergency Department (HOSPITAL_COMMUNITY): Payer: No Typology Code available for payment source

## 2022-05-13 ENCOUNTER — Encounter (HOSPITAL_COMMUNITY): Payer: Self-pay | Admitting: Emergency Medicine

## 2022-05-13 DIAGNOSIS — Z8673 Personal history of transient ischemic attack (TIA), and cerebral infarction without residual deficits: Secondary | ICD-10-CM

## 2022-05-13 DIAGNOSIS — F419 Anxiety disorder, unspecified: Secondary | ICD-10-CM | POA: Diagnosis present

## 2022-05-13 DIAGNOSIS — E785 Hyperlipidemia, unspecified: Secondary | ICD-10-CM

## 2022-05-13 DIAGNOSIS — Z95810 Presence of automatic (implantable) cardiac defibrillator: Secondary | ICD-10-CM | POA: Diagnosis present

## 2022-05-13 DIAGNOSIS — Z7952 Long term (current) use of systemic steroids: Secondary | ICD-10-CM

## 2022-05-13 DIAGNOSIS — F0393 Unspecified dementia, unspecified severity, with mood disturbance: Secondary | ICD-10-CM | POA: Diagnosis present

## 2022-05-13 DIAGNOSIS — R338 Other retention of urine: Secondary | ICD-10-CM | POA: Diagnosis present

## 2022-05-13 DIAGNOSIS — I5042 Chronic combined systolic (congestive) and diastolic (congestive) heart failure: Secondary | ICD-10-CM | POA: Diagnosis present

## 2022-05-13 DIAGNOSIS — J156 Pneumonia due to other aerobic Gram-negative bacteria: Secondary | ICD-10-CM | POA: Diagnosis not present

## 2022-05-13 DIAGNOSIS — Z681 Body mass index (BMI) 19 or less, adult: Secondary | ICD-10-CM

## 2022-05-13 DIAGNOSIS — Z20822 Contact with and (suspected) exposure to covid-19: Secondary | ICD-10-CM | POA: Diagnosis present

## 2022-05-13 DIAGNOSIS — I951 Orthostatic hypotension: Secondary | ICD-10-CM | POA: Diagnosis present

## 2022-05-13 DIAGNOSIS — F0390 Unspecified dementia without behavioral disturbance: Secondary | ICD-10-CM | POA: Diagnosis present

## 2022-05-13 DIAGNOSIS — Z7982 Long term (current) use of aspirin: Secondary | ICD-10-CM

## 2022-05-13 DIAGNOSIS — Z888 Allergy status to other drugs, medicaments and biological substances status: Secondary | ICD-10-CM

## 2022-05-13 DIAGNOSIS — E7800 Pure hypercholesterolemia, unspecified: Secondary | ICD-10-CM | POA: Diagnosis present

## 2022-05-13 DIAGNOSIS — J189 Pneumonia, unspecified organism: Secondary | ICD-10-CM | POA: Diagnosis not present

## 2022-05-13 DIAGNOSIS — G9341 Metabolic encephalopathy: Secondary | ICD-10-CM | POA: Diagnosis present

## 2022-05-13 DIAGNOSIS — I10 Essential (primary) hypertension: Secondary | ICD-10-CM | POA: Diagnosis present

## 2022-05-13 DIAGNOSIS — M7989 Other specified soft tissue disorders: Secondary | ICD-10-CM | POA: Diagnosis not present

## 2022-05-13 DIAGNOSIS — Z87891 Personal history of nicotine dependence: Secondary | ICD-10-CM

## 2022-05-13 DIAGNOSIS — M47812 Spondylosis without myelopathy or radiculopathy, cervical region: Secondary | ICD-10-CM | POA: Diagnosis not present

## 2022-05-13 DIAGNOSIS — E1143 Type 2 diabetes mellitus with diabetic autonomic (poly)neuropathy: Secondary | ICD-10-CM | POA: Diagnosis present

## 2022-05-13 DIAGNOSIS — E1122 Type 2 diabetes mellitus with diabetic chronic kidney disease: Secondary | ICD-10-CM | POA: Diagnosis present

## 2022-05-13 DIAGNOSIS — Z7902 Long term (current) use of antithrombotics/antiplatelets: Secondary | ICD-10-CM

## 2022-05-13 DIAGNOSIS — I130 Hypertensive heart and chronic kidney disease with heart failure and stage 1 through stage 4 chronic kidney disease, or unspecified chronic kidney disease: Secondary | ICD-10-CM | POA: Diagnosis present

## 2022-05-13 DIAGNOSIS — Z8249 Family history of ischemic heart disease and other diseases of the circulatory system: Secondary | ICD-10-CM

## 2022-05-13 DIAGNOSIS — E43 Unspecified severe protein-calorie malnutrition: Secondary | ICD-10-CM | POA: Diagnosis present

## 2022-05-13 DIAGNOSIS — I495 Sick sinus syndrome: Secondary | ICD-10-CM | POA: Diagnosis present

## 2022-05-13 DIAGNOSIS — R4182 Altered mental status, unspecified: Secondary | ICD-10-CM | POA: Diagnosis not present

## 2022-05-13 DIAGNOSIS — E11649 Type 2 diabetes mellitus with hypoglycemia without coma: Secondary | ICD-10-CM | POA: Diagnosis present

## 2022-05-13 DIAGNOSIS — Z85828 Personal history of other malignant neoplasm of skin: Secondary | ICD-10-CM

## 2022-05-13 DIAGNOSIS — R059 Cough, unspecified: Secondary | ICD-10-CM | POA: Diagnosis not present

## 2022-05-13 DIAGNOSIS — I2510 Atherosclerotic heart disease of native coronary artery without angina pectoris: Secondary | ICD-10-CM | POA: Diagnosis present

## 2022-05-13 DIAGNOSIS — R339 Retention of urine, unspecified: Secondary | ICD-10-CM | POA: Diagnosis present

## 2022-05-13 DIAGNOSIS — R627 Adult failure to thrive: Secondary | ICD-10-CM | POA: Diagnosis present

## 2022-05-13 DIAGNOSIS — R531 Weakness: Secondary | ICD-10-CM

## 2022-05-13 DIAGNOSIS — R4781 Slurred speech: Secondary | ICD-10-CM | POA: Diagnosis not present

## 2022-05-13 DIAGNOSIS — Z7984 Long term (current) use of oral hypoglycemic drugs: Secondary | ICD-10-CM

## 2022-05-13 DIAGNOSIS — Z993 Dependence on wheelchair: Secondary | ICD-10-CM

## 2022-05-13 DIAGNOSIS — Z66 Do not resuscitate: Secondary | ICD-10-CM | POA: Diagnosis present

## 2022-05-13 DIAGNOSIS — N1832 Chronic kidney disease, stage 3b: Secondary | ICD-10-CM | POA: Diagnosis present

## 2022-05-13 DIAGNOSIS — N183 Chronic kidney disease, stage 3 unspecified: Secondary | ICD-10-CM | POA: Diagnosis present

## 2022-05-13 DIAGNOSIS — Z8546 Personal history of malignant neoplasm of prostate: Secondary | ICD-10-CM

## 2022-05-13 DIAGNOSIS — I441 Atrioventricular block, second degree: Secondary | ICD-10-CM | POA: Diagnosis present

## 2022-05-13 DIAGNOSIS — Z882 Allergy status to sulfonamides status: Secondary | ICD-10-CM

## 2022-05-13 DIAGNOSIS — I428 Other cardiomyopathies: Secondary | ICD-10-CM | POA: Diagnosis present

## 2022-05-13 DIAGNOSIS — Z91013 Allergy to seafood: Secondary | ICD-10-CM

## 2022-05-13 DIAGNOSIS — E1169 Type 2 diabetes mellitus with other specified complication: Secondary | ICD-10-CM | POA: Diagnosis present

## 2022-05-13 DIAGNOSIS — J90 Pleural effusion, not elsewhere classified: Secondary | ICD-10-CM | POA: Diagnosis not present

## 2022-05-13 DIAGNOSIS — Z515 Encounter for palliative care: Secondary | ICD-10-CM

## 2022-05-13 DIAGNOSIS — R296 Repeated falls: Secondary | ICD-10-CM | POA: Diagnosis present

## 2022-05-13 DIAGNOSIS — D631 Anemia in chronic kidney disease: Secondary | ICD-10-CM | POA: Diagnosis present

## 2022-05-13 DIAGNOSIS — F0394 Unspecified dementia, unspecified severity, with anxiety: Secondary | ICD-10-CM | POA: Diagnosis present

## 2022-05-13 DIAGNOSIS — Z79899 Other long term (current) drug therapy: Secondary | ICD-10-CM

## 2022-05-13 DIAGNOSIS — N401 Enlarged prostate with lower urinary tract symptoms: Secondary | ICD-10-CM | POA: Diagnosis present

## 2022-05-13 LAB — CBC WITH DIFFERENTIAL/PLATELET
Abs Immature Granulocytes: 0.04 10*3/uL (ref 0.00–0.07)
Basophils Absolute: 0 10*3/uL (ref 0.0–0.1)
Basophils Relative: 0 %
Eosinophils Absolute: 0 10*3/uL (ref 0.0–0.5)
Eosinophils Relative: 0 %
HCT: 40.5 % (ref 39.0–52.0)
Hemoglobin: 12.5 g/dL — ABNORMAL LOW (ref 13.0–17.0)
Immature Granulocytes: 0 %
Lymphocytes Relative: 8 %
Lymphs Abs: 0.9 10*3/uL (ref 0.7–4.0)
MCH: 31.2 pg (ref 26.0–34.0)
MCHC: 30.9 g/dL (ref 30.0–36.0)
MCV: 101 fL — ABNORMAL HIGH (ref 80.0–100.0)
Monocytes Absolute: 0.5 10*3/uL (ref 0.1–1.0)
Monocytes Relative: 4 %
Neutro Abs: 9.8 10*3/uL — ABNORMAL HIGH (ref 1.7–7.7)
Neutrophils Relative %: 88 %
Platelets: 466 10*3/uL — ABNORMAL HIGH (ref 150–400)
RBC: 4.01 MIL/uL — ABNORMAL LOW (ref 4.22–5.81)
RDW: 14.7 % (ref 11.5–15.5)
WBC: 11.3 10*3/uL — ABNORMAL HIGH (ref 4.0–10.5)
nRBC: 0 % (ref 0.0–0.2)

## 2022-05-13 LAB — URINALYSIS, ROUTINE W REFLEX MICROSCOPIC
Bilirubin Urine: NEGATIVE
Glucose, UA: NEGATIVE mg/dL
Ketones, ur: 20 mg/dL — AB
Nitrite: NEGATIVE
Protein, ur: 100 mg/dL — AB
Specific Gravity, Urine: 1.025 (ref 1.005–1.030)
WBC, UA: >50 WBC/hpf — ABNORMAL HIGH (ref 0–5)
pH: 5 (ref 5.0–8.0)

## 2022-05-13 LAB — COMPREHENSIVE METABOLIC PANEL
ALT: 6 U/L (ref 0–44)
AST: 13 U/L — ABNORMAL LOW (ref 15–41)
Albumin: 2.5 g/dL — ABNORMAL LOW (ref 3.5–5.0)
Alkaline Phosphatase: 105 U/L (ref 38–126)
Anion gap: 12 (ref 5–15)
BUN: 24 mg/dL — ABNORMAL HIGH (ref 8–23)
CO2: 22 mmol/L (ref 22–32)
Calcium: 8.7 mg/dL — ABNORMAL LOW (ref 8.9–10.3)
Chloride: 108 mmol/L (ref 98–111)
Creatinine, Ser: 1.4 mg/dL — ABNORMAL HIGH (ref 0.61–1.24)
GFR, Estimated: 52 mL/min — ABNORMAL LOW (ref >60)
Glucose, Bld: 73 mg/dL (ref 70–99)
Potassium: 4.2 mmol/L (ref 3.5–5.1)
Sodium: 142 mmol/L (ref 135–145)
Total Bilirubin: 0.9 mg/dL (ref 0.3–1.2)
Total Protein: 6.7 g/dL (ref 6.5–8.1)

## 2022-05-13 LAB — GLUCOSE, CAPILLARY
Glucose-Capillary: 69 mg/dL — ABNORMAL LOW (ref 70–99)
Glucose-Capillary: 129 mg/dL — ABNORMAL HIGH (ref 70–99)

## 2022-05-13 LAB — RESP PANEL BY RT-PCR (FLU A&B, COVID) ARPGX2
Influenza A by PCR: NEGATIVE
Influenza B by PCR: NEGATIVE
SARS Coronavirus 2 by RT PCR: NEGATIVE

## 2022-05-13 LAB — TROPONIN I (HIGH SENSITIVITY): Troponin I (High Sensitivity): 8 ng/L (ref <18)

## 2022-05-13 LAB — LACTIC ACID, PLASMA: Lactic Acid, Venous: 1.3 mmol/L (ref 0.5–1.9)

## 2022-05-13 LAB — PHOSPHORUS: Phosphorus: 3.9 mg/dL (ref 2.5–4.6)

## 2022-05-13 LAB — RETICULOCYTES
Immature Retic Fract: 9.3 % (ref 2.3–15.9)
RBC.: 3.97 MIL/uL — ABNORMAL LOW (ref 4.22–5.81)
Retic Count, Absolute: 64.3 10*3/uL (ref 19.0–186.0)
Retic Ct Pct: 1.6 % (ref 0.4–3.1)

## 2022-05-13 LAB — CK: Total CK: 33 U/L — ABNORMAL LOW (ref 49–397)

## 2022-05-13 LAB — MAGNESIUM: Magnesium: 2 mg/dL (ref 1.7–2.4)

## 2022-05-13 MED ORDER — SODIUM CHLORIDE 0.9 % IV SOLN: INTRAVENOUS | Status: DC

## 2022-05-13 MED ORDER — ACETAMINOPHEN 325 MG PO TABS
650.0000 mg | ORAL_TABLET | Freq: Four times a day (QID) | ORAL | Status: DC | PRN
  Administered 2022-05-14: 650 mg via ORAL
  Filled 2022-05-13: qty 2

## 2022-05-13 MED ORDER — FINASTERIDE 5 MG PO TABS
5.0000 mg | ORAL_TABLET | Freq: Every day | ORAL | Status: DC
  Administered 2022-05-14 – 2022-05-15 (×2): 5 mg via ORAL
  Filled 2022-05-13 (×2): qty 1

## 2022-05-13 MED ORDER — SODIUM CHLORIDE 0.9 % IV SOLN
500.0000 mg | Freq: Once | INTRAVENOUS | Status: AC
  Administered 2022-05-13: 500 mg via INTRAVENOUS
  Filled 2022-05-13: qty 5

## 2022-05-13 MED ORDER — MIDODRINE HCL 5 MG PO TABS
5.0000 mg | ORAL_TABLET | Freq: Three times a day (TID) | ORAL | Status: DC
  Administered 2022-05-14 – 2022-05-15 (×5): 5 mg via ORAL
  Filled 2022-05-13 (×6): qty 1

## 2022-05-13 MED ORDER — CLOPIDOGREL BISULFATE 75 MG PO TABS
75.0000 mg | ORAL_TABLET | Freq: Every day | ORAL | Status: DC
  Administered 2022-05-13 – 2022-05-15 (×3): 75 mg via ORAL
  Filled 2022-05-13 (×3): qty 1

## 2022-05-13 MED ORDER — ACETAMINOPHEN 650 MG RE SUPP: 650.0000 mg | Freq: Four times a day (QID) | RECTAL | Status: DC | PRN

## 2022-05-13 MED ORDER — BUPROPION HCL ER (XL) 150 MG PO TB24
150.0000 mg | ORAL_TABLET | Freq: Every day | ORAL | Status: DC
  Administered 2022-05-14 – 2022-05-15 (×2): 150 mg via ORAL
  Filled 2022-05-13 (×2): qty 1

## 2022-05-13 MED ORDER — SODIUM CHLORIDE 0.9 % IV SOLN
1.0000 g | Freq: Once | INTRAVENOUS | Status: AC
  Administered 2022-05-13: 1 g via INTRAVENOUS
  Filled 2022-05-13: qty 10

## 2022-05-13 MED ORDER — ENSURE ENLIVE PO LIQD
237.0000 mL | Freq: Two times a day (BID) | ORAL | Status: DC
  Administered 2022-05-14: 237 mL via ORAL

## 2022-05-13 MED ORDER — ASPIRIN 81 MG PO TBEC
81.0000 mg | DELAYED_RELEASE_TABLET | Freq: Every day | ORAL | Status: DC
  Administered 2022-05-14 – 2022-05-15 (×2): 81 mg via ORAL
  Filled 2022-05-13 (×2): qty 1

## 2022-05-13 MED ORDER — GUAIFENESIN ER 600 MG PO TB12
600.0000 mg | ORAL_TABLET | Freq: Two times a day (BID) | ORAL | Status: DC
  Administered 2022-05-13 – 2022-05-15 (×4): 600 mg via ORAL
  Filled 2022-05-13 (×4): qty 1

## 2022-05-13 MED ORDER — DEXTROSE 50 % IV SOLN
12.5000 g | INTRAVENOUS | Status: AC
  Administered 2022-05-13: 12.5 g via INTRAVENOUS
  Filled 2022-05-13: qty 50

## 2022-05-13 MED ORDER — THIAMINE HCL 100 MG PO TABS
100.0000 mg | ORAL_TABLET | Freq: Every day | ORAL | Status: DC
  Administered 2022-05-13 – 2022-05-15 (×3): 100 mg via ORAL
  Filled 2022-05-13 (×3): qty 1

## 2022-05-13 MED ORDER — SODIUM CHLORIDE 0.9 % IV SOLN
500.0000 mg | INTRAVENOUS | Status: DC
  Administered 2022-05-14: 500 mg via INTRAVENOUS
  Filled 2022-05-13 (×2): qty 5

## 2022-05-13 MED ORDER — FLUDROCORTISONE ACETATE 0.1 MG PO TABS
0.1000 mg | ORAL_TABLET | Freq: Every day | ORAL | Status: DC
  Administered 2022-05-14 – 2022-05-15 (×2): 0.1 mg via ORAL
  Filled 2022-05-13 (×2): qty 1

## 2022-05-13 MED ORDER — SODIUM CHLORIDE 0.9 % IV BOLUS
500.0000 mL | Freq: Once | INTRAVENOUS | Status: AC
  Administered 2022-05-13: 500 mL via INTRAVENOUS

## 2022-05-13 MED ORDER — DEXTROSE-NACL 5-0.45 % IV SOLN: INTRAVENOUS | Status: DC

## 2022-05-13 MED ORDER — ALBUTEROL SULFATE (2.5 MG/3ML) 0.083% IN NEBU: 2.5000 mg | INHALATION_SOLUTION | RESPIRATORY_TRACT | Status: DC | PRN

## 2022-05-13 MED ORDER — MEGESTROL ACETATE 400 MG/10ML PO SUSP
400.0000 mg | Freq: Two times a day (BID) | ORAL | Status: DC
  Administered 2022-05-14 – 2022-05-15 (×4): 400 mg via ORAL
  Filled 2022-05-13 (×5): qty 10

## 2022-05-13 MED ORDER — TETRAHYDROZOLINE HCL 0.05 % OP SOLN
1.0000 [drp] | Freq: Three times a day (TID) | OPHTHALMIC | Status: DC
  Administered 2022-05-13 – 2022-05-15 (×5): 1 [drp] via OPHTHALMIC
  Filled 2022-05-13: qty 15

## 2022-05-13 MED ORDER — SODIUM CHLORIDE 0.9 % IV SOLN
2.0000 g | INTRAVENOUS | Status: DC
  Administered 2022-05-14 – 2022-05-15 (×2): 2 g via INTRAVENOUS
  Filled 2022-05-13 (×2): qty 20

## 2022-05-13 MED ORDER — HYDROCODONE-ACETAMINOPHEN 5-325 MG PO TABS
1.0000 | ORAL_TABLET | ORAL | Status: DC | PRN
  Administered 2022-05-14 – 2022-05-15 (×2): 1 via ORAL
  Filled 2022-05-13 (×2): qty 1

## 2022-05-13 MED ORDER — ATORVASTATIN CALCIUM 40 MG PO TABS
80.0000 mg | ORAL_TABLET | Freq: Every day | ORAL | Status: DC
  Administered 2022-05-14 – 2022-05-15 (×2): 80 mg via ORAL
  Filled 2022-05-13 (×2): qty 2

## 2022-05-13 NOTE — Assessment & Plan Note (Addendum)
In the setting of decreased p.o. intake start D5 half-normal and continue to follow administer thiamine

## 2022-05-13 NOTE — ED Triage Notes (Signed)
Patient presents due to failure to thrive. He has not been eating or drinking for an unknown amount of time. Since he was recently discharged from rehab (unknown reason), he has become weaker. Since Monday he has had slurred speech and his orientation was questionable. EMS administered 50 ml of D10 for a CBG of 62.      Hx: Dementia, DNR, Diabetes   62 original CBG -> 270 final CBG 90/50 BP (original) -> 99/55 BP final 80 HR pacemaker

## 2022-05-13 NOTE — ED Notes (Signed)
ED TO INPATIENT HANDOFF REPORT  ED Nurse Name and Phone #:   S Name/Age/Gender Kevin [name] 76 y.o. male Room/Bed: WA06/WA06  Code Status   Code Status: Prior  Home/SNF/Other Home Patient oriented to: self, place, time, and situation Is this baseline? Yes   Triage Complete: Triage complete  Chief Complaint CAP (community acquired pneumonia) [J18.9]  Triage Note Patient presents due to failure to thrive. He has not been eating or drinking for an unknown amount of time. Since he was recently discharged from rehab (unknown reason), he has become weaker. Since Monday he has had slurred speech and his orientation was questionable. EMS administered 50 ml of D10 for a CBG of 62.      Hx: Dementia, DNR, Diabetes   62 original CBG -> 270 final CBG 90/50 BP (original) -> 99/55 BP final 80 HR pacemaker    Allergies Allergies  Allergen Reactions   Sulfa Antibiotics Swelling    Swelling of lips only.   5-Alpha Reductase Inhibitors     Patient and wife state OK for patient to take finasteride - has not taken it before, NKA   Glipizide     Other reaction(s): Other (See Comments)   Metformin     Other reaction(s): Dizziness (intolerance)   Other     Other reaction(s): Other (See Comments) Patient and wife state OK for patient to take finasteride - has not taken it before, NKA   Shellfish Allergy Swelling    Level of Care/Admitting Diagnosis ED Disposition     ED Disposition  Admit   Condition  --   [place]: [place] [[id number]]  Level of Care: Telemetry [5]  Admit to tele based on following criteria: Other see comments  Comments: cap  May place patient in observation at [facility] or [place] if equivalent level of care is available:: No  Covid Evaluation: Asymptomatic - no recent exposure (last 10 days) testing not required  Diagnosis: CAP (community acquired pneumonia) [354656]  Admitting Physician: Toy Baker  [[id number]]  Attending Physician: Toy Baker [[id number]]          B Medical/Surgery History Past Medical History:  Diagnosis Date   Agent orange exposure    in [place]   Anemia    Dementia ([place])    DNI (do not intubate) 01/21/2022   DNR (do not resuscitate) 01/21/2022   Heart block AV second degree    a. s/p PPM implant with subsequent CRTD upgrade   High cholesterol    History of colon polyps    LBBB (left bundle branch block)    Nonischemic cardiomyopathy ([place])    a. MDT CRTD upgrade 2016   Pacemaker 08/27/2013   Dual-chamber Medtronic Adapta implanted January 2013 for bradycardia with alternating bundle branch block and 2:1 atrioventricular block    Prostate cancer ([place])    "not been treated yet; on a wait and see" (01/22/2015)   Type II diabetes mellitus ([place])    Past Surgical History:  Procedure Laterality Date   BASAL CELL CARCINOMA EXCISION     "back, face, neck"   BI-VENTRICULAR IMPLANTABLE CARDIOVERTER DEFIBRILLATOR UPGRADE N/A 01/22/2015   MDT CRTD upgrade by Dr Rayann Heman   BIV ICD GENERATOR CHANGEOUT N/A 08/28/2020   Procedure: BIV ICD GENERATOR CHANGEOUT;  Surgeon: [name], MD;  Location: [place] [facility];  Service: Cardiovascular;  Laterality: N/A;   CARDIAC CATHETERIZATION  01/27/2012   normal coronaries, dilated LV c/w nonischemic cardiomyopathy   EXAMINATION  UNDER ANESTHESIA  04/06/2012   Procedure: EXAM UNDER ANESTHESIA;  Surgeon: Marcello Moores A. Cornett, MD;  Location: [place];  Service: General;  Laterality: N/A;   HIP ARTHROPLASTY Left 03/10/2022   Procedure: ARTHROPLASTY BIPOLAR HIP (HEMIARTHROPLASTY);  Surgeon: Willaim Sheng, MD;  Location: [place];  Service: Orthopedics;  Laterality: Left;   INCISION AND DRAINAGE PERIRECTAL ABSCESS  ~ 2010; 2015   INGUINAL HERNIA REPAIR Left 1980   IR ANGIOGRAM PELVIS SELECTIVE OR SUPRASELECTIVE  10/20/2021   IR ANGIOGRAM VISCERAL SELECTIVE  10/20/2021   IR EMBO ART  VEN HEMORR LYMPH EXTRAV  INC GUIDE ROADMAPPING   10/20/2021   IR US GUIDE VASC ACCESS LEFT  10/20/2021   LEFT HEART CATHETERIZATION WITH CORONARY ANGIOGRAM N/A 01/27/2012   Procedure: LEFT HEART CATHETERIZATION WITH CORONARY ANGIOGRAM;  Surgeon: Troy Sine, MD;  Location[facility] C[facility];  Service: Cardiovascular;  Laterality: N/A;   NM MYOCAR PERF WALL MOTION  01/21/2012   mod-severe perfusion defect due to infarct/scar w/mild perinfarct ischemia in the apical,basal inferoseptal,basal inferior,mid inferoseptal,midinferior and apical inferior regions   ORIF SHOULDER FRACTURE Left 03/11/2022   Procedure: OPEN REDUCTION INTERNAL FIXATION (ORIF) SHOULDER FRACTURE;  Surgeon: Altamese Amherstdale, MD;  Location: [place];  Service: Orthopedics;  Laterality: Left;   pacemaker battery     PERMANENT PACEMAKER INSERTION N/A 12/01/2011   Medtronic implanted by Dr Sallyanne Kuster   PROSTATE BIOPSY     PROSTATE BIOPSY     shrapnel surgery     "in [place]"   TONSILLECTOMY       A IV Location/Drains/Wounds Patient Lines/Drains/Airways Status     Active Line/Drains/Airways     Name Placement date Placement time Site Days   Peripheral IV 05/13/22 20 G Left;Posterior Hand 05/13/22  1501  Hand  less than 1   Incision (Closed) 03/10/22 Hip Left 03/10/22  1816  -- 64   Incision (Closed) 03/11/22 Arm Left 03/11/22  1425  -- 63            Intake/Output Last 24 hours  Intake/Output Summary (Last 24 hours) at 05/13/2022 2013 Last data filed at 05/13/2022 1826 Gross per 24 hour  Intake 100 ml  Output --  Net 100 ml    Labs/Imaging Results for orders placed or performed during the hospital encounter of 05/13/22 (from the past 48 hour(s))  Lactic acid, plasma     Status: None   Collection Time: 05/13/22  5:25 PM  Result Value Ref Range   Lactic Acid, Venous 1.3 0.5 - 1.9 mmol/L    Comment: Performed at [facility], [place] [address], [place], [place] [address]  CBC with Differential     Status: Abnormal   Collection Time: 05/13/22  5:25 PM   Result Value Ref Range   WBC 11.3 (H) 4.0 - 10.5 K/uL   RBC 4.01 (L) 4.22 - 5.81 MIL/uL   Hemoglobin 12.5 (L) 13.0 - 17.0 g/dL   HCT 40.5 39.0 - 52.0 %   MCV 101.0 (H) 80.0 - 100.0 fL   MCH 31.2 26.0 - 34.0 pg   MCHC 30.9 30.0 - 36.0 g/dL   RDW 14.7 11.5 - 15.5 %   Platelets 466 (H) 150 - 400 K/uL   nRBC 0.0 0.0 - 0.2 %   Neutrophils Relative % 88 %   Neutro Abs 9.8 (H) 1.7 - 7.7 K/uL   Lymphocytes Relative 8 %   Lymphs Abs 0.9 0.7 - 4.0 K/uL   Monocytes Relative 4 %   Monocytes  Absolute 0.5 0.1 - 1.0 K/uL   Eosinophils Relative 0 %   Eosinophils Absolute 0.0 0.0 - 0.5 K/uL   Basophils Relative 0 %   Basophils Absolute 0.0 0.0 - 0.1 K/uL   Immature Granulocytes 0 %   Abs Immature Granulocytes 0.04 0.00 - 0.07 K/uL    Comment: Performed at [facility], [place] [address], [place], [place] [address]  Urinalysis, Routine w reflex microscopic Urine, Catheterized     Status: Abnormal   Collection Time: 05/13/22  6:27 PM  Result Value Ref Range   Color, Urine YELLOW YELLOW   APPearance TURBID (A) CLEAR   Specific Gravity, Urine 1.025 1.005 - 1.030   pH 5.0 5.0 - 8.0   Glucose, UA NEGATIVE NEGATIVE mg/dL   Hgb urine dipstick MODERATE (A) NEGATIVE   Bilirubin Urine NEGATIVE NEGATIVE   Ketones, ur 20 (A) NEGATIVE mg/dL   Protein, ur 100 (A) NEGATIVE mg/dL   Nitrite NEGATIVE NEGATIVE   Leukocytes,Ua LARGE (A) NEGATIVE   RBC / HPF 21-50 0 - 5 RBC/hpf   WBC, UA >50 (H) 0 - 5 WBC/hpf   Bacteria, UA MANY (A) NONE SEEN   WBC Clumps PRESENT    Mucus PRESENT    Hyaline Casts, UA PRESENT     Comment: Performed at [facility], [place] [address], [place], [place] [address]  Comprehensive metabolic panel     Status: Abnormal   Collection Time: 05/13/22  6:27 PM  Result Value Ref Range   Sodium 142 135 - 145 mmol/L   Potassium 4.2 3.5 - 5.1 mmol/L   Chloride 108 98 - 111 mmol/L   CO2 22 22 - 32 mmol/L   Glucose, Bld 73 70 - 99 mg/dL    Comment:  Glucose reference range applies only to samples taken after fasting for at least 8 hours.   BUN 24 (H) 8 - 23 mg/dL   Creatinine, Ser 1.40 (H) 0.61 - 1.24 mg/dL   Calcium 8.7 (L) 8.9 - 10.3 mg/dL   Total Protein 6.7 6.5 - 8.1 g/dL   Albumin 2.5 (L) 3.5 - 5.0 g/dL   AST 13 (L) 15 - 41 U/L   ALT 6 0 - 44 U/L   Alkaline Phosphatase 105 38 - 126 U/L   Total Bilirubin 0.9 0.3 - 1.2 mg/dL   GFR, Estimated 52 (L) >60 mL/min    Comment: (NOTE) Calculated using the CKD-EPI Creatinine Equation (2021)    Anion gap 12 5 - 15    Comment: Performed at [facility], [place] [address] [place], [place] [address]   DG Elbow Complete Left  Result Date: 05/13/2022 CLINICAL DATA:  [place], elbow swelling EXAM: LEFT ELBOW - COMPLETE 3+ VIEW COMPARISON:  03/09/2022 FINDINGS: Frontal, bilateral oblique, lateral views of the left elbow are obtained. No acute fracture, subluxation, or dislocation. Stable osteoarthritis. No joint effusion. Mild dorsal soft tissue swelling. IMPRESSION: 1. No acute fracture. 2. Stable osteoarthritis. 3. Dorsal soft tissue swelling. Electronically Signed   By: Randa Ngo M.D.   On: 05/13/2022 16:50   DG Chest 2 View  Result Date: 05/13/2022 CLINICAL DATA:  Failure to thrive, cough EXAM: CHEST - 2 VIEW COMPARISON:  03/09/2022 FINDINGS: Frontal and lateral views of the chest demonstrate an unremarkable cardiac silhouette. Multi lead pacer unchanged. Interval development of left lower lobe airspace disease, obscuring the posterior costophrenic angle. Small left pleural effusion is suspected. No pneumothorax. No acute bony abnormalities. Postsurgical changes left humeral ORIF. IMPRESSION:  1. Left lower lobe pneumonia and small left parapneumonic effusion. Electronically Signed   By: Randa Ngo M.D.   On: 05/13/2022 16:49   CT Head Wo Contrast  Result Date: 05/13/2022 CLINICAL DATA:  Mental status change, unknown cause; Neck trauma (Age >= 65y). Fall. Slurred speech.  EXAM: CT HEAD WITHOUT CONTRAST CT CERVICAL SPINE WITHOUT CONTRAST TECHNIQUE: Multidetector CT imaging of the head and cervical spine was performed following the standard protocol without intravenous contrast. Multiplanar CT image reconstructions of the cervical spine were also generated. RADIATION DOSE REDUCTION: This exam was performed according to the departmental dose-optimization program which includes automated exposure control, adjustment of the mA and/or kV according to patient size and/or use of iterative reconstruction technique. COMPARISON:  CT head and cervical spine 03/09/2022 FINDINGS: CT HEAD FINDINGS Brain: There is no evidence of an acute infarct, intracranial hemorrhage, mass, midline shift, or extra-axial fluid collection. Mild cerebral atrophy is unchanged. Hypodensities in the cerebral white matter bilaterally are unchanged and nonspecific but compatible with mild chronic small vessel ischemic disease. Small chronic infarcts are again noted in the left occipital lobe and both cerebellar hemispheres. Vascular: Calcified atherosclerosis at the skull base. No hyperdense vessel. Skull: No fracture or suspicious osseous lesion. Sinuses/Orbits: Visualized paranasal sinuses are clear. Trace chronic left mastoid effusion. Bilateral cataract extraction. Other: None. CT CERVICAL SPINE FINDINGS Alignment: Unchanged trace anterolisthesis of C7 on T1. Skull base and vertebrae: No acute fracture or suspicious osseous lesion. Soft tissues and spinal canal: No prevertebral fluid or swelling. No visible canal hematoma. Disc levels: Unchanged cervical spondylosis. Interbody ankylosis at C5-6. Moderate spinal stenosis at C4-5 and C5-6 due to disc bulging, spurring, and posterior longitudinal ligament ossification. Mild right neural foraminal stenosis at C4-5 due to uncovertebral spurring and asymmetrically severe right facet arthrosis. Upper chest: Mild biapical pleuroparenchymal lung scarring. Other: None.  IMPRESSION: 1. No evidence of acute intracranial abnormality. 2. 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 3. No acute cervical spine fracture. Electronically Signed   By: Logan Bores M.D.   On: 05/13/2022 16:42   CT Cervical Spine Wo Contrast  Result Date: 05/13/2022 CLINICAL DATA:  Mental status change, unknown cause; Neck trauma (Age >= 65y). Fall. Slurred speech. EXAM: CT HEAD WITHOUT CONTRAST CT CERVICAL SPINE WITHOUT CONTRAST TECHNIQUE: Multidetector CT imaging of the head and cervical spine was performed following the standard protocol without intravenous contrast. Multiplanar CT image reconstructions of the cervical spine were also generated. RADIATION DOSE REDUCTION: This exam was performed according to the departmental dose-optimization program which includes automated exposure control, adjustment of the mA and/or kV according to patient size and/or use of iterative reconstruction technique. COMPARISON:  CT head and cervical spine 03/09/2022 FINDINGS: CT HEAD FINDINGS Brain: There is no evidence of an acute infarct, intracranial hemorrhage, mass, midline shift, or extra-axial fluid collection. Mild cerebral atrophy is unchanged. Hypodensities in the cerebral white matter bilaterally are unchanged and nonspecific but compatible with mild chronic small vessel ischemic disease. Small chronic infarcts are again noted in the left occipital lobe and both cerebellar hemispheres. Vascular: Calcified atherosclerosis at the skull base. No hyperdense vessel. Skull: No fracture or suspicious osseous lesion. Sinuses/Orbits: Visualized paranasal sinuses are clear. Trace chronic left mastoid effusion. Bilateral cataract extraction. Other: None. CT CERVICAL SPINE FINDINGS Alignment: Unchanged trace anterolisthesis of C7 on T1. Skull base and vertebrae: No acute fracture or suspicious osseous lesion. Soft tissues and spinal canal: No prevertebral fluid or swelling. No visible canal hematoma.  Disc levels:  Unchanged cervical spondylosis. Interbody ankylosis at C5-6. Moderate spinal stenosis at C4-5 and C5-6 due to disc bulging, spurring, and posterior longitudinal ligament ossification. Mild right neural foraminal stenosis at C4-5 due to uncovertebral spurring and asymmetrically severe right facet arthrosis. Upper chest: Mild biapical pleuroparenchymal lung scarring. Other: None. IMPRESSION: 1. No evidence of acute intracranial abnormality. 2. 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 3. No acute cervical spine fracture. Electronically Signed   By: Logan Bores M.D.   On: 05/13/2022 16:42    Pending Labs Unresulted Labs (From admission, onward)     Start     Ordered   05/14/22 0500  Prealbumin  Tomorrow morning,   R        05/13/22 2009   05/13/22 2010  Vitamin B12  (Anemia Panel (PNL))  Once,   R        05/13/22 2009   05/13/22 2010  Folate  (Anemia Panel (PNL))  Once,   R        05/13/22 2009   05/13/22 2010  Iron and TIBC  (Anemia Panel (PNL))  Once,   R        05/13/22 2009   05/13/22 2010  Ferritin  (Anemia Panel (PNL))  Once,   R        05/13/22 2009   05/13/22 2010  Reticulocytes  (Anemia Panel (PNL))  Once,   R        05/13/22 2009   05/13/22 2009  Ammonia  Once,   R       Question:  Release to patient  Answer:  Immediate   05/13/22 2009   05/13/22 2009  CK  Add-on,   AD        05/13/22 2009   05/13/22 2009  Magnesium  Add-on,   AD        05/13/22 2009   05/13/22 2009  Osmolality  Add-on,   AD        05/13/22 2009   05/13/22 2009  Phosphorus  Add-on,   AD        05/13/22 2009   05/13/22 2009  Osmolality, urine  Once,   R        05/13/22 2009   05/13/22 2009  Protime-INR  Once,   R       Question:  Release to patient  Answer:  Immediate   05/13/22 2009   05/13/22 2009  TSH  Add-on,   AD        05/13/22 2009   05/13/22 2005  Blood gas, venous  Once,   R        05/13/22 2004   05/13/22 1536  Lactic acid, plasma  Now then every 2 hours,   R  (with STAT occurrences)      05/13/22 1537   05/13/22 1536  Resp Panel by RT-PCR (Flu A&B, Covid) Anterior Nasal Swab  Once,   URGENT        05/13/22 1537   Unscheduled  Occult blood card to lab, stool  As needed,   R      05/13/22 2009            Vitals/Pain Today's Vitals   05/13/22 1830 05/13/22 1900 05/13/22 1956 05/13/22 2000  BP: (!) 146/76  127/69 123/67  Pulse: 88 79 98 98  Resp: (!) 26 (!) 22 17   Temp:      TempSrc:      SpO2: 95% 92% 93% 94%  PainSc:  Isolation Precautions No active isolations  Medications Medications  sodium chloride 0.9 % bolus 500 mL (500 mLs Intravenous New Bag/Given 05/13/22 1728)  cefTRIAXone (ROCEPHIN) 1 g in sodium chloride 0.9 % 100 mL IVPB (0 g Intravenous Stopped 05/13/22 1826)  azithromycin (ZITHROMAX) 500 mg in sodium chloride 0.9 % 250 mL IVPB (500 mg Intravenous New Bag/Given 05/13/22 1858)    Mobility walks with person assist High fall risk   Focused Assessments    R Recommendations: See Admitting Provider Note  Report given to:   Additional Notes:

## 2022-05-13 NOTE — Assessment & Plan Note (Signed)
Patient is status post indwelling Foley catheter We will continue for now Urine appears to be dirty possibly secondary to colonization.  No symptoms of UTI

## 2022-05-13 NOTE — Assessment & Plan Note (Signed)
Holding home medications Patient has been having episodes of hypotension instead we will continue midodrine 5 mg 3 times daily as well as Florinef

## 2022-05-13 NOTE — Telephone Encounter (Signed)
Last VV- 01/04/22

## 2022-05-13 NOTE — ED Notes (Signed)
I made nurse aware that I need a new sample for the I Stat Chem 8.  The sample was bad.

## 2022-05-13 NOTE — Assessment & Plan Note (Signed)
Has been hypoglycemic continue to monitor serial CBG start on D5 half-normal after administration of thiamine

## 2022-05-13 NOTE — Assessment & Plan Note (Signed)
Will need nutrition consult PT OT assessment patient likely will need placement Check prealbumin

## 2022-05-13 NOTE — Assessment & Plan Note (Signed)
Given altered mental status held Ativan for tonight but if there is evidence of agitation will need to restart

## 2022-05-13 NOTE — Assessment & Plan Note (Signed)
-   currently appears to be slightly on the dry side, hold home diuretics for tonight and restart when appears euvolemic, carefuly follow fluid status and Cr  

## 2022-05-13 NOTE — Assessment & Plan Note (Signed)
Suspicious for pneumonia based on severe coughing increased shortness of breath and she has significant infiltrates  Treated with Rocephin azithromycin Obtain sputum cultures Check check strep and Legionella serologies Check MRSA

## 2022-05-13 NOTE — Assessment & Plan Note (Signed)
Chronic stable in place we will hold off on MRI

## 2022-05-13 NOTE — ED Provider Notes (Signed)
[facility] DEPT Provider Note   CSN: [id number] Arrival date & time: 05/13/22  1447     History  Chief Complaint  Patient presents with   Failure To Thrive    Kevin [name] is a 76 y.o. male.  Patient is a 76 year old male who presents with reported general weakness and failure to thrive.  History is obtained by EMS as there is no family at bedside.  I did an extensive chart review.  He has a reported history of dementia diabetes and multiple falls.  He was admitted on May 9 to May 22 after a fall in which he fractured his hip and his left shoulder.  He had ORIF's of these areas.  He also developed urinary retention and now has an indwelling Foley catheter.  He was discharged to rehab from May 9 to May 22.  He was discharged home.  He returned to [facility] on 526 after a fall and he had dislodgment of the plate in his shoulders.  He went back to have the fixation of this.  He was admitted until June 3 due to weakness in the falls.  He is currently having home health visits.  On a recent home health visit, he was noted to have postural hypotension and has been started on Florinef.  He was noted to have a CBG of 62 today by EMS and was administered 50 cc of D10.  His blood pressures been in the 35D systolic.       Home Medications Prior to Admission medications   Medication Sig Start Date End Date Taking? Authorizing Provider  acetaminophen (TYLENOL) 325 MG tablet Take 325 mg by mouth every 6 (six) hours as needed for moderate pain. 04/10/22  Yes [provider]  aspirin EC 81 MG tablet Take 81 mg by mouth daily. Swallow whole.   Yes [provider]  atorvastatin (LIPITOR) 80 MG tablet Take 1 tablet (80 mg total) by mouth daily. 03/17/22  Yes Sheikh, Omair Latif, DO  buPROPion (WELLBUTRIN XL) 150 MG 24 hr tablet Take 1 tablet (150 mg total) by mouth daily. 03/29/22  Yes Love, Ivan Anchors, PA-C  Carboxymethylcellulose Sodium (EYE DROPS OP) Place  1 drop into both eyes 3 (three) times daily. Given by [facility] doctor to help astigmtism   Yes [provider]  carvedilol (COREG) 3.125 MG tablet Take 1 tablet (3.125 mg total) by mouth at bedtime. Patient taking differently: Take 3.125 mg by mouth daily. 03/29/22  Yes Love, Ivan Anchors, PA-C  cholecalciferol (VITAMIN D) 25 MCG (1000 UNIT) tablet Take 1 tablet (1,000 Units total) by mouth daily. 11/19/21  Yes Angiulli, Lavon Paganini, PA-C  clopidogrel (PLAVIX) 75 MG tablet Take 75 mg by mouth daily. 02/25/22  Yes [provider]  docusate sodium (COLACE) 100 MG capsule Take 1 capsule (100 mg total) by mouth 2 (two) times daily. Patient taking differently: Take 100 mg by mouth daily as needed for mild constipation. 03/29/22  Yes Love, Ivan Anchors, PA-C  donepezil (ARICEPT) 5 MG tablet Take 1 tablet (5 mg total) by mouth daily. 09/10/21  Yes Cameron Sprang, MD  feeding supplement (ENSURE ENLIVE / ENSURE PLUS) LIQD Take 237 mLs by mouth 2 (two) times daily between meals. 03/16/22  Yes Sheikh, Omair Latif, DO  finasteride (PROSCAR) 5 MG tablet Take 1 tablet (5 mg total) by mouth daily. 03/29/22  Yes Love, Ivan Anchors, PA-C  fludrocortisone (FLORINEF) 0.1 MG tablet Take 0.1 mg by mouth daily. 05/12/22  Yes [provider]  loratadine (CLARITIN) 10 MG tablet Take 10 mg by mouth daily as needed for allergies.   Yes [provider]  LORazepam (ATIVAN) 0.5 MG tablet Take 1 tablet (0.5 mg total) by mouth daily as needed for anxiety. 03/29/22  Yes Love, Ivan Anchors, PA-C  megestrol (MEGACE) 400 MG/10ML suspension Take 10 mLs (400 mg total) by mouth 2 (two) times daily. 30 minutes before lunch and supper 03/29/22  Yes Love, Ivan Anchors, PA-C  memantine (NAMENDA) 10 MG tablet Take 1 tablet twice a day Patient taking differently: Take 10 mg by mouth 2 (two) times daily. 09/10/21  Yes Cameron Sprang, MD  metFORMIN (GLUCOPHAGE) 500 MG tablet Take 500 mg by mouth 2 (two) times daily. 05/12/22  Yes [provider]  mirtazapine (REMERON SOL-TAB) 15 MG disintegrating tablet Take 1 tablet (15 mg total) by mouth at bedtime. Patient taking differently: Take 15 mg by mouth at bedtime. For appetite 01/04/22  Yes Burchette, Alinda Sierras, MD  polyethylene glycol (MIRALAX / GLYCOLAX) 17 g packet Take 17 g by mouth daily as needed. Patient taking differently: Take 17 g by mouth daily as needed for moderate constipation. 03/29/22  Yes Love, Ivan Anchors, PA-C  senna-docusate (SENOKOT-S) 8.6-50 MG tablet Take 2 tablets by mouth daily after supper. Patient taking differently: Take 2 tablets by mouth daily as needed for mild constipation. 03/29/22  Yes Love, Ivan Anchors, PA-C  SIMBRINZA 1-0.2 % SUSP Place 1 drop into both eyes 3 (three) times daily. 05/12/22  Yes [provider]  traZODone (DESYREL) 50 MG tablet Take 0.5-1 tablets (25-50 mg total) by mouth at bedtime as needed for sleep. Patient taking differently: Take 50 mg by mouth at bedtime. 03/29/22  Yes Love, Ivan Anchors, PA-C  alfuzosin (UROXATRAL) 10 MG 24 hr tablet Take 1 tablet (10 mg total) by mouth daily with supper. Patient not taking: Reported on 05/13/2022 03/29/22   Love, Ivan Anchors, PA-C  cephALEXin (KEFLEX) 500 MG capsule Take 1 capsule (500 mg total) by mouth every 12 (twelve) hours. Patient not taking: Reported on 05/13/2022 03/29/22   Love, Ivan Anchors, PA-C  Continuous Blood Gluc Sensor (FREESTYLE LIBRE 14 DAY SENSOR) MISC SMARTSIG:1 Each Topical Every 2 Weeks 11/30/21   [provider]  enoxaparin (LOVENOX) 40 MG/0.4ML injection Inject 0.4 mLs (40 mg total) into the skin daily. Patient not taking: Reported on 05/13/2022 03/29/22   Love, Ivan Anchors, PA-C  HYDROcodone-acetaminophen (NORCO/VICODIN) 5-325 MG tablet Take 1-2 tablets by mouth every 6 (six) hours as needed for moderate pain or severe pain (pain score 4-6). Patient not taking: Reported on 05/13/2022 03/29/22   Love, Ivan Anchors, PA-C  midodrine (PROAMATINE) 5 MG tablet Take 5 mg by mouth 3 (three) times daily.  05/12/22   [provider]  Multiple Vitamin (MULTIVITAMIN WITH MINERALS) TABS tablet Take 1 tablet by mouth daily. Patient not taking: Reported on 05/13/2022 03/25/22   Bary Leriche, PA-C      Allergies    Sulfa antibiotics, 5-alpha reductase inhibitors, Glipizide, Metformin, and Shellfish allergy    Review of Systems   Review of Systems  Unable to perform ROS: Dementia    Physical Exam Updated Vital Signs BP 126/77 (BP Location: Right Arm)   Pulse 90   Temp 97.9 F (36.6 C) (Oral)   Resp 20   SpO2 94%  Physical Exam Constitutional:      Appearance: He is ill-appearing.     Comments: Cachectic  HENT:  Head: Normocephalic and atraumatic.     Mouth/Throat:     Mouth: Mucous membranes are moist.  Eyes:     Extraocular Movements: Extraocular movements intact.     Conjunctiva/sclera: Conjunctivae normal.     Pupils: Pupils are equal, round, and reactive to light.  Neck:     Comments: No tenderness along the spine Cardiovascular:     Rate and Rhythm: Normal rate.     Pulses: Normal pulses.     Heart sounds: Normal heart sounds. No murmur heard. Pulmonary:     Effort: Pulmonary effort is normal.     Breath sounds: Rhonchi present.  Abdominal:     General: Abdomen is flat. There is no distension.     Tenderness: There is no abdominal tenderness.  Musculoskeletal:        General: Normal range of motion.     Comments: Small skin tear overlying the left elbow.  There are some mild swelling around the elbow.  There is healing incision over the left shoulder from his prior surgery.  No swelling or deformity noted there.  No other pain on palpation or range of motion extremities  Neurological:     General: No focal deficit present.     Comments: Patient is awake and alert.  He will converse and he is oriented to person place and year.  However he does seem to have some delusions and is talking about things that are not making sense.  No obvious focal deficits are noted.   He has some limitation in movement of his left arm which is presumed to be from his prior shoulder injury.     ED Results / Procedures / Treatments   Labs (all labs ordered are listed, but only abnormal results are displayed) Labs Reviewed  CBC WITH DIFFERENTIAL/PLATELET - Abnormal; Notable for the following components:      Result Value   WBC 11.3 (*)    RBC 4.01 (*)    Hemoglobin 12.5 (*)    MCV 101.0 (*)    Platelets 466 (*)    Neutro Abs 9.8 (*)    All other components within normal limits  URINALYSIS, ROUTINE W REFLEX MICROSCOPIC - Abnormal; Notable for the following components:   APPearance TURBID (*)    Hgb urine dipstick MODERATE (*)    Ketones, ur 20 (*)    Protein, ur 100 (*)    Leukocytes,Ua LARGE (*)    WBC, UA >50 (*)    Bacteria, UA MANY (*)    All other components within normal limits  COMPREHENSIVE METABOLIC PANEL - Abnormal; Notable for the following components:   BUN 24 (*)    Creatinine, Ser 1.40 (*)    Calcium 8.7 (*)    Albumin 2.5 (*)    AST 13 (*)    GFR, Estimated 52 (*)    All other components within normal limits  CK - Abnormal; Notable for the following components:   Total CK 33 (*)    All other components within normal limits  RETICULOCYTES - Abnormal; Notable for the following components:   RBC. 3.97 (*)    All other components within normal limits  GLUCOSE, CAPILLARY - Abnormal; Notable for the following components:   Glucose-Capillary 69 (*)    All other components within normal limits  GLUCOSE, CAPILLARY - Abnormal; Notable for the following components:   Glucose-Capillary 129 (*)    All other components within normal limits  RESP PANEL BY RT-PCR (FLU A&B, COVID) [id number]  URINE CULTURE  SARS CORONAVIRUS 2 BY RT PCR  CULTURE, BLOOD (ROUTINE X 2)  CULTURE, BLOOD (ROUTINE X 2)  EXPECTORATED SPUTUM ASSESSMENT W GRAM STAIN, RFLX TO RESP C  LACTIC ACID, PLASMA  MAGNESIUM  PHOSPHORUS  LACTIC ACID, PLASMA  BLOOD GAS, VENOUS  AMMONIA   OSMOLALITY, URINE  PREALBUMIN  TSH  VITAMIN B12  FOLATE  IRON AND TIBC  FERRITIN  PROTIME-INR  OSMOLALITY  TSH  COMPREHENSIVE METABOLIC PANEL  CBC  LEGIONELLA PNEUMOPHILA SEROGP 1 UR AG  STREP PNEUMONIAE URINARY ANTIGEN  I-STAT CHEM 8, ED  CBG MONITORING, ED  TROPONIN I (HIGH SENSITIVITY)    EKG None  Radiology DG Elbow Complete Left  Result Date: 05/13/2022 CLINICAL DATA:  [place], elbow swelling EXAM: LEFT ELBOW - COMPLETE 3+ VIEW COMPARISON:  03/09/2022 FINDINGS: Frontal, bilateral oblique, lateral views of the left elbow are obtained. No acute fracture, subluxation, or dislocation. Stable osteoarthritis. No joint effusion. Mild dorsal soft tissue swelling. IMPRESSION: 1. No acute fracture. 2. Stable osteoarthritis. 3. Dorsal soft tissue swelling. Electronically Signed   By: Randa Ngo M.D.   On: 05/13/2022 16:50   DG Chest 2 View  Result Date: 05/13/2022 CLINICAL DATA:  Failure to thrive, cough EXAM: CHEST - 2 VIEW COMPARISON:  03/09/2022 FINDINGS: Frontal and lateral views of the chest demonstrate an unremarkable cardiac silhouette. Multi lead pacer unchanged. Interval development of left lower lobe airspace disease, obscuring the posterior costophrenic angle. Small left pleural effusion is suspected. No pneumothorax. No acute bony abnormalities. Postsurgical changes left humeral ORIF. IMPRESSION: 1. Left lower lobe pneumonia and small left parapneumonic effusion. Electronically Signed   By: Randa Ngo M.D.   On: 05/13/2022 16:49   CT Head Wo Contrast  Result Date: 05/13/2022 CLINICAL DATA:  Mental status change, unknown cause; Neck trauma (Age >= 65y). Fall. Slurred speech. EXAM: CT HEAD WITHOUT CONTRAST CT CERVICAL SPINE WITHOUT CONTRAST TECHNIQUE: Multidetector CT imaging of the head and cervical spine was performed following the standard protocol without intravenous contrast. Multiplanar CT image reconstructions of the cervical spine were also generated. RADIATION DOSE  REDUCTION: This exam was performed according to the departmental dose-optimization program which includes automated exposure control, adjustment of the mA and/or kV according to patient size and/or use of iterative reconstruction technique. COMPARISON:  CT head and cervical spine 03/09/2022 FINDINGS: CT HEAD FINDINGS Brain: There is no evidence of an acute infarct, intracranial hemorrhage, mass, midline shift, or extra-axial fluid collection. Mild cerebral atrophy is unchanged. Hypodensities in the cerebral white matter bilaterally are unchanged and nonspecific but compatible with mild chronic small vessel ischemic disease. Small chronic infarcts are again noted in the left occipital lobe and both cerebellar hemispheres. Vascular: Calcified atherosclerosis at the skull base. No hyperdense vessel. Skull: No fracture or suspicious osseous lesion. Sinuses/Orbits: Visualized paranasal sinuses are clear. Trace chronic left mastoid effusion. Bilateral cataract extraction. Other: None. CT CERVICAL SPINE FINDINGS Alignment: Unchanged trace anterolisthesis of C7 on T1. Skull base and vertebrae: No acute fracture or suspicious osseous lesion. Soft tissues and spinal canal: No prevertebral fluid or swelling. No visible canal hematoma. Disc levels: Unchanged cervical spondylosis. Interbody ankylosis at C5-6. Moderate spinal stenosis at C4-5 and C5-6 due to disc bulging, spurring, and posterior longitudinal ligament ossification. Mild right neural foraminal stenosis at C4-5 due to uncovertebral spurring and asymmetrically severe right facet arthrosis. Upper chest: Mild biapical pleuroparenchymal lung scarring. Other: None. IMPRESSION: 1. No evidence of acute intracranial abnormality. 2. 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 3. No acute cervical spine fracture. Electronically Signed   By: Logan Bores M.D.   On: 05/13/2022 16:42   CT Cervical Spine Wo Contrast  Result Date: 05/13/2022 CLINICAL DATA:   Mental status change, unknown cause; Neck trauma (Age >= 65y). Fall. Slurred speech. EXAM: CT HEAD WITHOUT CONTRAST CT CERVICAL SPINE WITHOUT CONTRAST TECHNIQUE: Multidetector CT imaging of the head and cervical spine was performed following the standard protocol without intravenous contrast. Multiplanar CT image reconstructions of the cervical spine were also generated. RADIATION DOSE REDUCTION: This exam was performed according to the departmental dose-optimization program which includes automated exposure control, adjustment of the mA and/or kV according to patient size and/or use of iterative reconstruction technique. COMPARISON:  CT head and cervical spine 03/09/2022 FINDINGS: CT HEAD FINDINGS Brain: There is no evidence of an acute infarct, intracranial hemorrhage, mass, midline shift, or extra-axial fluid collection. Mild cerebral atrophy is unchanged. Hypodensities in the cerebral white matter bilaterally are unchanged and nonspecific but compatible with mild chronic small vessel ischemic disease. Small chronic infarcts are again noted in the left occipital lobe and both cerebellar hemispheres. Vascular: Calcified atherosclerosis at the skull base. No hyperdense vessel. Skull: No fracture or suspicious osseous lesion. Sinuses/Orbits: Visualized paranasal sinuses are clear. Trace chronic left mastoid effusion. Bilateral cataract extraction. Other: None. CT CERVICAL SPINE FINDINGS Alignment: Unchanged trace anterolisthesis of C7 on T1. Skull base and vertebrae: No acute fracture or suspicious osseous lesion. Soft tissues and spinal canal: No prevertebral fluid or swelling. No visible canal hematoma. Disc levels: Unchanged cervical spondylosis. Interbody ankylosis at C5-6. Moderate spinal stenosis at C4-5 and C5-6 due to disc bulging, spurring, and posterior longitudinal ligament ossification. Mild right neural foraminal stenosis at C4-5 due to uncovertebral spurring and asymmetrically severe right facet  arthrosis. Upper chest: Mild biapical pleuroparenchymal lung scarring. Other: None. IMPRESSION: 1. No evidence of acute intracranial abnormality. 2. 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 3. No acute cervical spine fracture. Electronically Signed   By: Logan Bores M.D.   On: 05/13/2022 16:42    Procedures Procedures    Medications Ordered in ED Medications  atorvastatin (LIPITOR) tablet 80 mg (has no administration in time range)  aspirin EC tablet 81 mg (has no administration in time range)  buPROPion (WELLBUTRIN XL) 24 hr tablet 150 mg (has no administration in time range)  tetrahydrozoline 0.05 % ophthalmic solution 1 drop (has no administration in time range)  finasteride (PROSCAR) tablet 5 mg (has no administration in time range)  feeding supplement (ENSURE ENLIVE / ENSURE PLUS) liquid 237 mL (has no administration in time range)  fludrocortisone (FLORINEF) tablet 0.1 mg (has no administration in time range)  megestrol (MEGACE) 400 MG/10ML suspension 400 mg (has no administration in time range)  midodrine (PROAMATINE) tablet 5 mg (has no administration in time range)  0.9 %  sodium chloride infusion (has no administration in time range)  acetaminophen (TYLENOL) tablet 650 mg (has no administration in time range)    Or  acetaminophen (TYLENOL) suppository 650 mg (has no administration in time range)  HYDROcodone-acetaminophen (NORCO/VICODIN) 5-325 MG per tablet 1-2 tablet (has no administration in time range)  cefTRIAXone (ROCEPHIN) 2 g in sodium chloride 0.9 % 100 mL IVPB (has no administration in time range)  azithromycin (ZITHROMAX) 500 mg in sodium chloride 0.9 % 250 mL IVPB (has no administration in time range)  thiamine tablet 100 mg (100 mg Oral Given 05/13/22 2143)  albuterol (PROVENTIL) (2.5 MG/3ML) 0.083% nebulizer solution  2.5 mg (has no administration in time range)  guaiFENesin (MUCINEX) 12 hr tablet 600 mg (600 mg Oral Given 05/13/22 2143)   clopidogrel (PLAVIX) tablet 75 mg (75 mg Oral Given 05/13/22 2143)  dextrose 5 %-0.45 % sodium chloride infusion ( Intravenous New Bag/Given 05/13/22 2200)  sodium chloride 0.9 % bolus 500 mL (500 mLs Intravenous New Bag/Given 05/13/22 1728)  cefTRIAXone (ROCEPHIN) 1 g in sodium chloride 0.9 % 100 mL IVPB (0 g Intravenous Stopped 05/13/22 1826)  azithromycin (ZITHROMAX) 500 mg in sodium chloride 0.9 % 250 mL IVPB (500 mg Intravenous New Bag/Given 05/13/22 1858)  dextrose 50 % solution 12.5 g (12.5 g Intravenous Given 05/13/22 2146)    ED Course/ Medical Decision Making/ A&P                           Medical Decision Making Amount and/or Complexity of Data Reviewed Labs: ordered. Radiology: ordered.  Risk Decision regarding hospitalization.   Patient is a 76 year old male who has had a rough course over the last few months with multiple falls and fractures.  He is currently back at home with his wife.  His wife does report that he has had increased weakness and has lost a lot of weight.  He is not eating or drinking well.  He looks cachectic.  His chest x-ray does show evidence of left-sided pneumonia.  This was interpreted by me and confirmed by the radiologist.  He was started on antibiotics and IV fluids.  His other labs are nonconcerning.  He had a CT scan of his head and cervical spine which showed no acute abnormalities.  He had an x-ray of his left elbow which shows no fracture.  This was interpreted by me and confirmed by radiology.  His skin tear does not need suturing.  He does have a little bit of slurred speech although I do not find any other obvious deficits or overt concerns for stroke.  Nothing was obvious on the head CT.  He may need an MRI at some point if this is doable given his pacemaker.  I spoke with Dr. Roel Cluck who will admit the patient for further treatment.  Final Clinical Impression(s) / ED Diagnoses Final diagnoses:  Community acquired pneumonia of left lung, unspecified  part of lung  Weakness    Rx / DC Orders ED Discharge Orders     None         Malvin Johns, MD 05/13/22 2217

## 2022-05-13 NOTE — H&P (Signed)
TRIG MCBRYAR UXL:[id number] DOB: May 25, 1946 DOA: 05/13/2022     PCP: [name], MD   Outpatient Specialists:   CARDS:   Dr.[name]   No care team member to display Urology Jed Limerick, FNP [facility]  Patient arrived to ER on 05/13/22 at 1447 Referred by Attending Malvin Johns, MD   Patient coming from:    home Lives  With family    Chief Complaint:   Chief Complaint  Patient presents with   Failure To Thrive    HPI: Kevin [name] is a 76 y.o. male with medical history significant of hypertension dementia diabetes, frequent falls failure to thrive prostate cancer, SDH, CKD    Presented with  hypoglycemia Patient has not been eating or drinking for unclear time discharge recently from rehab and since then has deteriorated became weaker status slurred speech and confusion on arrival of EMS CBG was down to 62 and he was given 1 amp of D50 with improving blood sugar up to 270 noted to be initially hypotensive 90s over 50s.  Patient has known history of dementia and diabetes and frequent falls Last severe fall was in May 2023 when he had a fractured hip and left shoulder he developed urinary retention and now has indwelling Foley catheter was discharged to rehab on May 22 and then discharged to home had another fall since then and was evaluated at [facility] had dislodgment of a plate in his shoulder and had to be reaffixed. Had another admission back in June 3 for generalized weakness and falls. Noted to have at home postural hypotension started on Florinef   No tobacco no etoh Does not take insulin only on metformin for DM Has not been able to walk at home He has a wheelchair Has been having hard time since DC from rehab    Initial COVID TEST  in house  PCR testing  Pending  Lab Results  Component Value Date   [place] 03/09/2022   [place] 01/07/2022   [place] NEGATIVE 11/05/2021   [place] NEGATIVE  10/21/2021     Regarding pertinent Chronic problems:    Hyperlipidemia -  on statins Lipitor (atorvastatin)  Lipid Panel     Component Value Date/Time   CHOL 94 08/10/2021 1613   TRIG 116.0 08/10/2021 1613   HDL 42.30 08/10/2021 1613   CHOLHDL 2 08/10/2021 1613   VLDL 23.2 08/10/2021 1613   LDLCALC 29 08/10/2021 1613   LDLDIRECT 58.6 09/24/2014 0824     HTN on Coreg   chronic CHF diastolic/systolic/ combined - last echo 2021 EF 53-664  Grade 1 daistolic CHF  Hx of AICD and pacemaker    CAD  - On Aspirin, statin, betablocker, Plavix                 -  followed by cardiology           DM 2 -  Lab Results  Component Value Date   HGBA1C 5.3 01/08/2022   on PO meds only,          Hx of CVA -  with/out residual deficits on Aspirin 81 mg,  Plavix       CKD stage IIIb- baseline Cr 1.4  CrCl cannot be calculated (Unknown ideal weight.).  Lab Results  Component Value Date   CREATININE 1.40 (H) 05/13/2022   CREATININE 1.35 (H) 03/29/2022   CREATININE 1.45 (H) 03/26/2022      BPH - on   Proscar  Dementia - on Aricept     Chronic anemia - baseline hg Hemoglobin & Hematocrit  Recent Labs    03/26/22 0515 03/29/22 0630 05/13/22 1725  HGB 9.5* 10.2* 12.5*     While in ER:    Found to have multiple skin tears likely had more falls.  Seems to be altered and confused UA concerning for UTI  Ordered  CT HEAD/neck   NON acute Chronic ischemia with old left occipital and cerebellar infarcts.  CXR - Left lower lobe pneumonia and small left parapneumonic effusion.  Left elbow no fractures  Following Medications were ordered in ER: Medications  sodium chloride 0.9 % bolus 500 mL (500 mLs Intravenous New Bag/Given 05/13/22 1728)  cefTRIAXone (ROCEPHIN) 1 g in sodium chloride 0.9 % 100 mL IVPB (0 g Intravenous Stopped 05/13/22 1826)  azithromycin (ZITHROMAX) 500 mg in sodium chloride 0.9 % 250 mL IVPB (500 mg Intravenous New Bag/Given 05/13/22 1858)       ED  Triage Vitals  Enc Vitals Group     BP 05/13/22 1452 (!) 71/49     Pulse Rate 05/13/22 1452 (!) 106     Resp 05/13/22 1452 20     Temp 05/13/22 1452 97.6 F (36.4 C)     Temp Source 05/13/22 1452 Oral     SpO2 05/13/22 1452 93 %     Weight --      Height --      Head Circumference --      Peak Flow --      Pain Score 05/13/22 1459 0     Pain Loc --      Pain Edu? --      Excl. in [place]? --   TMAX(24)@     _________________________________________ Significant initial  Findings: Abnormal Labs Reviewed  CBC WITH DIFFERENTIAL/PLATELET - Abnormal; Notable for the following components:      Result Value   WBC 11.3 (*)    RBC 4.01 (*)    Hemoglobin 12.5 (*)    MCV 101.0 (*)    Platelets 466 (*)    Neutro Abs 9.8 (*)    All other components within normal limits  URINALYSIS, ROUTINE W REFLEX MICROSCOPIC - Abnormal; Notable for the following components:   APPearance TURBID (*)    Hgb urine dipstick MODERATE (*)    Ketones, ur 20 (*)    Protein, ur 100 (*)    Leukocytes,Ua LARGE (*)    WBC, UA >50 (*)    Bacteria, UA MANY (*)    All other components within normal limits  COMPREHENSIVE METABOLIC PANEL - Abnormal; Notable for the following components:   BUN 24 (*)    Creatinine, Ser 1.40 (*)    Calcium 8.7 (*)    Albumin 2.5 (*)    AST 13 (*)    GFR, Estimated 52 (*)    All other components within normal limits     _________________________ Troponin 8 ECG: Ordered      The recent clinical data is shown below. Vitals:   05/13/22 1715 05/13/22 1828 05/13/22 1830 05/13/22 1900  BP:  (!) 164/77 (!) 146/76   Pulse: 88 84 88 79  Resp: (!) 27 15 (!) 26 (!) 22  Temp:      TempSrc:      SpO2: 95% 95% 95% 92%    WBC     Component Value Date/Time   WBC 11.3 (H) 05/13/2022 1725   LYMPHSABS 0.9 05/13/2022 1725   LYMPHSABS 1.2 08/15/2020  1006   MONOABS 0.5 05/13/2022 1725   EOSABS 0.0 05/13/2022 1725   EOSABS 0.1 08/15/2020 1006   BASOSABS 0.0 05/13/2022 1725   BASOSABS  0.0 08/15/2020 1006        Lactic Acid, Venous    Component Value Date/Time   LATICACIDVEN 1.3 05/13/2022 1725     Procalcitonin   Ordered     UA   few bacteria but evidence of red and white cells present   Urine analysis:    Component Value Date/Time   COLORURINE YELLOW 05/13/2022 1827   APPEARANCEUR TURBID (A) 05/13/2022 1827   LABSPEC 1.025 05/13/2022 1827   PHURINE 5.0 05/13/2022 1827   GLUCOSEU NEGATIVE 05/13/2022 1827   GLUCOSEU NEGATIVE 02/06/2020 1609   HGBUR MODERATE (A) 05/13/2022 1827   BILIRUBINUR NEGATIVE 05/13/2022 1827   KETONESUR 20 (A) 05/13/2022 1827   PROTEINUR 100 (A) 05/13/2022 1827   UROBILINOGEN 0.2 02/06/2020 1609   NITRITE NEGATIVE 05/13/2022 1827   LEUKOCYTESUR LARGE (A) 05/13/2022 1827    Results for orders placed or performed during the hospital encounter of 03/16/22  Urine Culture     Status: Abnormal   Collection Time: 03/24/22  8:22 AM   Specimen: Urine, Clean Catch  Result Value Ref Range Status   Specimen Description URINE, CLEAN CATCH  Final   Special Requests   Final    NONE Performed at [facility], [place] [address], [place], [place] [address]    Culture >=100,000 COLONIES/mL KLEBSIELLA PNEUMONIAE (A)  Final   Report Status 03/26/2022 FINAL  Final   Organism ID, Bacteria KLEBSIELLA PNEUMONIAE (A)  Final      Susceptibility   Klebsiella pneumoniae - MIC*    AMPICILLIN >=32 RESISTANT Resistant     CEFAZOLIN <=4 SENSITIVE Sensitive     CEFEPIME <=0.12 SENSITIVE Sensitive     CEFTRIAXONE <=0.25 SENSITIVE Sensitive     CIPROFLOXACIN <=0.25 SENSITIVE Sensitive     GENTAMICIN <=1 SENSITIVE Sensitive     IMIPENEM <=0.25 SENSITIVE Sensitive     NITROFURANTOIN 32 SENSITIVE Sensitive     TRIMETH/SULFA <=20 SENSITIVE Sensitive     AMPICILLIN/SULBACTAM 4 SENSITIVE Sensitive     PIP/TAZO <=4 SENSITIVE Sensitive     * >=100,000 COLONIES/mL KLEBSIELLA PNEUMONIAE     _______________________________________________ Hospitalist was  called for admission for  CAp, UTI   The following Work up has been ordered so far:  Orders Placed This Encounter  Procedures   Resp Panel by RT-PCR (Flu A&B, Covid) Anterior Nasal Swab   CT Head Wo Contrast   CT Cervical Spine Wo Contrast   DG Chest 2 View   DG Elbow Complete Left   Lactic acid, plasma   CBC with Differential   Urinalysis, Routine w reflex microscopic Urine, Catheterized   Comprehensive metabolic panel   Check Rectal Temperature   Consult to hospitalist   I-stat chem 8, ED     OTHER Significant initial  Findings:  labs showing:    Recent Labs  Lab 05/13/22 1827  NA 142  K 4.2  CO2 22  GLUCOSE 73  BUN 24*  CREATININE 1.40*  CALCIUM 8.7*    Cr stable,   Lab Results  Component Value Date   CREATININE 1.40 (H) 05/13/2022   CREATININE 1.35 (H) 03/29/2022   CREATININE 1.45 (H) 03/26/2022    Recent Labs  Lab 05/13/22 1827  AST 13*  ALT 6  ALKPHOS 105  BILITOT 0.9  PROT 6.7  ALBUMIN 2.5*   Lab Results  Component Value Date   CALCIUM 8.7 (L) 05/13/2022   PHOS 3.8 03/15/2022       Plt: Lab Results  Component Value Date   PLT 466 (H) 05/13/2022        Recent Labs  Lab 05/13/22 1725  WBC 11.3*  NEUTROABS 9.8*  HGB 12.5*  HCT 40.5  MCV 101.0*  PLT 466*    HG/HCT   stable     Component Value Date/Time   HGB 12.5 (L) 05/13/2022 1725   HGB 16.3 08/15/2020 1006   HCT 40.5 05/13/2022 1725   HCT 47.5 08/15/2020 1006   MCV 101.0 (H) 05/13/2022 1725   MCV 98 (H) 08/15/2020 1006     Cardiac Panel (last 3 results) Recent Labs    05/13/22 1827  CKTOTAL 33*      DM  labs:  HbA1C: Recent Labs    08/10/21 1613 10/22/21 0359 01/08/22 0900  HGBA1C 6.3 6.7* 5.3       CBG (last 3)  Recent Labs    05/13/22 2120 05/13/22 2203  GLUCAP 69* 129*          Cultures:    Component Value Date/Time   SDES URINE, CLEAN CATCH 03/24/2022 0822   SPECREQUEST  03/24/2022 0822    NONE Performed at [facility],  [place] [address] [place], [place] [address]    CULT >=100,000 COLONIES/mL KLEBSIELLA PNEUMONIAE (A) 03/24/2022 0822   REPTSTATUS 03/26/2022 FINAL 03/24/2022 7026     Radiological Exams on Admission: DG Elbow Complete Left  Result Date: 05/13/2022 CLINICAL DATA:  [place], elbow swelling EXAM: LEFT ELBOW - COMPLETE 3+ VIEW COMPARISON:  03/09/2022 FINDINGS: Frontal, bilateral oblique, lateral views of the left elbow are obtained. No acute fracture, subluxation, or dislocation. Stable osteoarthritis. No joint effusion. Mild dorsal soft tissue swelling. IMPRESSION: 1. No acute fracture. 2. Stable osteoarthritis. 3. Dorsal soft tissue swelling. Electronically Signed   By: Randa Ngo M.D.   On: 05/13/2022 16:50   DG Chest 2 View  Result Date: 05/13/2022 CLINICAL DATA:  Failure to thrive, cough EXAM: CHEST - 2 VIEW COMPARISON:  03/09/2022 FINDINGS: Frontal and lateral views of the chest demonstrate an unremarkable cardiac silhouette. Multi lead pacer unchanged. Interval development of left lower lobe airspace disease, obscuring the posterior costophrenic angle. Small left pleural effusion is suspected. No pneumothorax. No acute bony abnormalities. Postsurgical changes left humeral ORIF. IMPRESSION: 1. Left lower lobe pneumonia and small left parapneumonic effusion. Electronically Signed   By: Randa Ngo M.D.   On: 05/13/2022 16:49   CT Head Wo Contrast  Result Date: 05/13/2022 CLINICAL DATA:  Mental status change, unknown cause; Neck trauma (Age >= 65y). Fall. Slurred speech. EXAM: CT HEAD WITHOUT CONTRAST CT CERVICAL SPINE WITHOUT CONTRAST TECHNIQUE: Multidetector CT imaging of the head and cervical spine was performed following the standard protocol without intravenous contrast. Multiplanar CT image reconstructions of the cervical spine were also generated. RADIATION DOSE REDUCTION: This exam was performed according to the departmental dose-optimization program which includes automated exposure control,  adjustment of the mA and/or kV according to patient size and/or use of iterative reconstruction technique. COMPARISON:  CT head and cervical spine 03/09/2022 FINDINGS: CT HEAD FINDINGS Brain: There is no evidence of an acute infarct, intracranial hemorrhage, mass, midline shift, or extra-axial fluid collection. Mild cerebral atrophy is unchanged. Hypodensities in the cerebral white matter bilaterally are unchanged and nonspecific but compatible with mild chronic small vessel ischemic disease. Small chronic infarcts are again noted in the left  occipital lobe and both cerebellar hemispheres. Vascular: Calcified atherosclerosis at the skull base. No hyperdense vessel. Skull: No fracture or suspicious osseous lesion. Sinuses/Orbits: Visualized paranasal sinuses are clear. Trace chronic left mastoid effusion. Bilateral cataract extraction. Other: None. CT CERVICAL SPINE FINDINGS Alignment: Unchanged trace anterolisthesis of C7 on T1. Skull base and vertebrae: No acute fracture or suspicious osseous lesion. Soft tissues and spinal canal: No prevertebral fluid or swelling. No visible canal hematoma. Disc levels: Unchanged cervical spondylosis. Interbody ankylosis at C5-6. Moderate spinal stenosis at C4-5 and C5-6 due to disc bulging, spurring, and posterior longitudinal ligament ossification. Mild right neural foraminal stenosis at C4-5 due to uncovertebral spurring and asymmetrically severe right facet arthrosis. Upper chest: Mild biapical pleuroparenchymal lung scarring. Other: None. IMPRESSION: 1. No evidence of acute intracranial abnormality. 2. 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 3. No acute cervical spine fracture. Electronically Signed   By: Logan Bores M.D.   On: 05/13/2022 16:42   CT Cervical Spine Wo Contrast  Result Date: 05/13/2022 CLINICAL DATA:  Mental status change, unknown cause; Neck trauma (Age >= 65y). Fall. Slurred speech. EXAM: CT HEAD WITHOUT CONTRAST CT CERVICAL  SPINE WITHOUT CONTRAST TECHNIQUE: Multidetector CT imaging of the head and cervical spine was performed following the standard protocol without intravenous contrast. Multiplanar CT image reconstructions of the cervical spine were also generated. RADIATION DOSE REDUCTION: This exam was performed according to the departmental dose-optimization program which includes automated exposure control, adjustment of the mA and/or kV according to patient size and/or use of iterative reconstruction technique. COMPARISON:  CT head and cervical spine 03/09/2022 FINDINGS: CT HEAD FINDINGS Brain: There is no evidence of an acute infarct, intracranial hemorrhage, mass, midline shift, or extra-axial fluid collection. Mild cerebral atrophy is unchanged. Hypodensities in the cerebral white matter bilaterally are unchanged and nonspecific but compatible with mild chronic small vessel ischemic disease. Small chronic infarcts are again noted in the left occipital lobe and both cerebellar hemispheres. Vascular: Calcified atherosclerosis at the skull base. No hyperdense vessel. Skull: No fracture or suspicious osseous lesion. Sinuses/Orbits: Visualized paranasal sinuses are clear. Trace chronic left mastoid effusion. Bilateral cataract extraction. Other: None. CT CERVICAL SPINE FINDINGS Alignment: Unchanged trace anterolisthesis of C7 on T1. Skull base and vertebrae: No acute fracture or suspicious osseous lesion. Soft tissues and spinal canal: No prevertebral fluid or swelling. No visible canal hematoma. Disc levels: Unchanged cervical spondylosis. Interbody ankylosis at C5-6. Moderate spinal stenosis at C4-5 and C5-6 due to disc bulging, spurring, and posterior longitudinal ligament ossification. Mild right neural foraminal stenosis at C4-5 due to uncovertebral spurring and asymmetrically severe right facet arthrosis. Upper chest: Mild biapical pleuroparenchymal lung scarring. Other: None. IMPRESSION: 1. No evidence of acute intracranial  abnormality. 2. 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 3. No acute cervical spine fracture. Electronically Signed   By: Logan Bores M.D.   On: 05/13/2022 16:42   _______________________________________________________________________________________________________ Latest  Blood pressure (!) 146/76, pulse 79, temperature 98.5 F (36.9 C), temperature source Rectal, resp. rate (!) 22, SpO2 92 %.   Vitals  labs and radiology finding personally reviewed  Review of Systems:    Pertinent positives include:  fatigue, confusion  Constitutional:  No weight loss, night sweats, Fevers, chills, weight loss  HEENT:  No headaches, Difficulty swallowing,Tooth/dental problems,Sore throat,  No sneezing, itching, ear ache, nasal congestion, post nasal drip,  Cardio-vascular:  No chest pain, Orthopnea, PND, anasarca, dizziness, palpitations.no Bilateral lower extremity swelling  GI:  No heartburn,  indigestion, abdominal pain, nausea, vomiting, diarrhea, change in bowel habits, loss of appetite, melena, blood in stool, hematemesis Resp:  no shortness of breath at rest. No dyspnea on exertion, No excess mucus, no productive cough, No non-productive cough, No coughing up of blood.No change in color of mucus.No wheezing. Skin:  no rash or lesions. No jaundice GU:  no dysuria, change in color of urine, no urgency or frequency. No straining to urinate.  No flank pain.  Musculoskeletal:  No joint pain or no joint swelling. No decreased range of motion. No back pain.  Psych:  No change in mood or affect. No depression or anxiety. No memory loss.  Neuro: no localizing neurological complaints, no tingling, no weakness, no double vision, no gait abnormality, no slurred speech, no   All systems reviewed and apart from [place] all are negative _______________________________________________________________________________________________ Past Medical History:   Past Medical History:   Diagnosis Date   Agent orange exposure    in [place]   Anemia    Dementia ([place])    DNI (do not intubate) 01/21/2022   DNR (do not resuscitate) 01/21/2022   Heart block AV second degree    a. s/p PPM implant with subsequent CRTD upgrade   High cholesterol    History of colon polyps    LBBB (left bundle branch block)    Nonischemic cardiomyopathy ([place])    a. MDT CRTD upgrade 2016   Pacemaker 08/27/2013   Dual-chamber Medtronic Adapta implanted January 2013 for bradycardia with alternating bundle branch block and 2:1 atrioventricular block    Prostate cancer ([place])    "not been treated yet; on a wait and see" (01/22/2015)   Type II diabetes mellitus ([place])      Past Surgical History:  Procedure Laterality Date   BASAL CELL CARCINOMA EXCISION     "back, face, neck"   BI-VENTRICULAR IMPLANTABLE CARDIOVERTER DEFIBRILLATOR UPGRADE N/A 01/22/2015   MDT CRTD upgrade by Dr Rayann Heman   BIV ICD GENERATOR CHANGEOUT N/A 08/28/2020   Procedure: BIV ICD GENERATOR CHANGEOUT;  Surgeon: [name], MD;  Location: [facility];  Service: Cardiovascular;  Laterality: N/A;   CARDIAC CATHETERIZATION  01/27/2012   normal coronaries, dilated LV c/w nonischemic cardiomyopathy   EXAMINATION UNDER ANESTHESIA  04/06/2012   Procedure: EXAM UNDER ANESTHESIA;  Surgeon: Marcello Moores A. Cornett, MD;  Location: [facility];  Service: General;  Laterality: N/A;   HIP ARTHROPLASTY Left 03/10/2022   Procedure: ARTHROPLASTY BIPOLAR HIP (HEMIARTHROPLASTY);  Surgeon: Willaim Sheng, MD;  Location: [facility];  Service: Orthopedics;  Laterality: Left;   INCISION AND DRAINAGE PERIRECTAL ABSCESS  ~ 2010; 2015   INGUINAL HERNIA REPAIR Left 1980   IR ANGIOGRAM PELVIS SELECTIVE OR SUPRASELECTIVE  10/20/2021   IR ANGIOGRAM VISCERAL SELECTIVE  10/20/2021   IR EMBO ART  VEN HEMORR LYMPH EXTRAV  INC GUIDE ROADMAPPING  10/20/2021   IR US GUIDE VASC ACCESS LEFT  10/20/2021   LEFT HEART CATHETERIZATION WITH CORONARY ANGIOGRAM N/A 01/27/2012    Procedure: LEFT HEART CATHETERIZATION WITH CORONARY ANGIOGRAM;  Surgeon: Troy Sine, MD;  Location: [facility];  Service: Cardiovascular;  Laterality: N/A;   NM MYOCAR PERF WALL MOTION  01/21/2012   mod-severe perfusion defect due to infarct/scar w/mild perinfarct ischemia in the apical,basal inferoseptal,basal inferior,mid inferoseptal,midinferior and apical inferior regions   ORIF SHOULDER FRACTURE Left 03/11/2022   Procedure: OPEN REDUCTION INTERNAL FIXATION (ORIF) SHOULDER FRACTURE;  Surgeon: Altamese Harcourt, MD;  Location: [facility];  Service: Orthopedics;  Laterality: Left;  pacemaker battery     PERMANENT PACEMAKER INSERTION N/A 12/01/2011   Medtronic implanted by Dr Sallyanne Kuster   PROSTATE BIOPSY     PROSTATE BIOPSY     shrapnel surgery     "in [place]"   TONSILLECTOMY      Social History:  Ambulatory  wheelchair bound     reports that he quit smoking about 38 years ago. His smoking use included cigarettes. He has a 20.00 pack-year smoking history. He has never used smokeless tobacco. He reports current alcohol use. He reports that he does not use drugs.     Family History:  Family History  Problem Relation Age of Onset   Ovarian cancer Mother    Cancer Mother        ?uterine   Leukemia Father    Heart disease Father    Diabetes Neg Hx    ______________________________________________________________________________________________ Allergies: Allergies  Allergen Reactions   Sulfa Antibiotics Swelling    Swelling of lips only.   5-Alpha Reductase Inhibitors     Patient and wife state OK for patient to take finasteride - has not taken it before, NKA   Glipizide     Other reaction(s): Other (See Comments)   Metformin     Other reaction(s): Dizziness (intolerance)   Other     Other reaction(s): Other (See Comments) Patient and wife state OK for patient to take finasteride - has not taken it before, NKA   Shellfish Allergy Swelling     Prior to Admission medications    Medication Sig Start Date End Date Taking? Authorizing Provider  acetaminophen (TYLENOL) 325 MG tablet Take 325 mg by mouth every 6 (six) hours as needed for moderate pain. 04/10/22  Yes [provider]  aspirin EC 81 MG tablet Take 81 mg by mouth daily. Swallow whole.   Yes [provider]  atorvastatin (LIPITOR) 80 MG tablet Take 1 tablet (80 mg total) by mouth daily. 03/17/22  Yes Sheikh, Omair Latif, DO  buPROPion (WELLBUTRIN XL) 150 MG 24 hr tablet Take 1 tablet (150 mg total) by mouth daily. 03/29/22  Yes Love, Ivan Anchors, PA-C  Carboxymethylcellulose Sodium (EYE DROPS OP) Place 1 drop into both eyes 3 (three) times daily. Given by [facility] doctor to help astigmtism   Yes [provider]  carvedilol (COREG) 3.125 MG tablet Take 1 tablet (3.125 mg total) by mouth at bedtime. Patient taking differently: Take 3.125 mg by mouth daily. 03/29/22  Yes Love, Ivan Anchors, PA-C  cholecalciferol (VITAMIN D) 25 MCG (1000 UNIT) tablet Take 1 tablet (1,000 Units total) by mouth daily. 11/19/21  Yes Angiulli, Lavon Paganini, PA-C  clopidogrel (PLAVIX) 75 MG tablet Take 75 mg by mouth daily. 02/25/22  Yes [provider]  docusate sodium (COLACE) 100 MG capsule Take 1 capsule (100 mg total) by mouth 2 (two) times daily. Patient taking differently: Take 100 mg by mouth daily as needed for mild constipation. 03/29/22  Yes Love, Ivan Anchors, PA-C  donepezil (ARICEPT) 5 MG tablet Take 1 tablet (5 mg total) by mouth daily. 09/10/21  Yes Cameron Sprang, MD  feeding supplement (ENSURE ENLIVE / ENSURE PLUS) LIQD Take 237 mLs by mouth 2 (two) times daily between meals. 03/16/22  Yes Sheikh, Omair Latif, DO  finasteride (PROSCAR) 5 MG tablet Take 1 tablet (5 mg total) by mouth daily. 03/29/22  Yes Love, Ivan Anchors, PA-C  fludrocortisone (FLORINEF) 0.1 MG tablet Take 0.1 mg by mouth daily. 05/12/22  Yes [provider]  loratadine (  CLARITIN) 10 MG tablet Take 10 mg by mouth daily as needed for allergies.    Yes [provider]  LORazepam (ATIVAN) 0.5 MG tablet Take 1 tablet (0.5 mg total) by mouth daily as needed for anxiety. 03/29/22  Yes Love, Ivan Anchors, PA-C  megestrol (MEGACE) 400 MG/10ML suspension Take 10 mLs (400 mg total) by mouth 2 (two) times daily. 30 minutes before lunch and supper 03/29/22  Yes Love, Ivan Anchors, PA-C  memantine (NAMENDA) 10 MG tablet Take 1 tablet twice a day Patient taking differently: Take 10 mg by mouth 2 (two) times daily. 09/10/21  Yes Cameron Sprang, MD  metFORMIN (GLUCOPHAGE) 500 MG tablet Take 500 mg by mouth 2 (two) times daily. 05/12/22  Yes [provider]  mirtazapine (REMERON SOL-TAB) 15 MG disintegrating tablet Take 1 tablet (15 mg total) by mouth at bedtime. Patient taking differently: Take 15 mg by mouth at bedtime. For appetite 01/04/22  Yes Burchette, Alinda Sierras, MD  polyethylene glycol (MIRALAX / GLYCOLAX) 17 g packet Take 17 g by mouth daily as needed. Patient taking differently: Take 17 g by mouth daily as needed for moderate constipation. 03/29/22  Yes Love, Ivan Anchors, PA-C  senna-docusate (SENOKOT-S) 8.6-50 MG tablet Take 2 tablets by mouth daily after supper. Patient taking differently: Take 2 tablets by mouth daily as needed for mild constipation. 03/29/22  Yes Love, Ivan Anchors, PA-C  SIMBRINZA 1-0.2 % SUSP Place 1 drop into both eyes 3 (three) times daily. 05/12/22  Yes [provider]  traZODone (DESYREL) 50 MG tablet Take 0.5-1 tablets (25-50 mg total) by mouth at bedtime as needed for sleep. Patient taking differently: Take 50 mg by mouth at bedtime. 03/29/22  Yes Love, Ivan Anchors, PA-C  alfuzosin (UROXATRAL) 10 MG 24 hr tablet Take 1 tablet (10 mg total) by mouth daily with supper. Patient not taking: Reported on 05/13/2022 03/29/22   Love, Ivan Anchors, PA-C  cephALEXin (KEFLEX) 500 MG capsule Take 1 capsule (500 mg total) by mouth every 12 (twelve) hours. Patient not taking: Reported on 05/13/2022 03/29/22   Love, Ivan Anchors, PA-C  Continuous  Blood Gluc Sensor (FREESTYLE LIBRE 14 DAY SENSOR) MISC SMARTSIG:1 Each Topical Every 2 Weeks 11/30/21   [provider]  enoxaparin (LOVENOX) 40 MG/0.4ML injection Inject 0.4 mLs (40 mg total) into the skin daily. Patient not taking: Reported on 05/13/2022 03/29/22   Love, Ivan Anchors, PA-C  HYDROcodone-acetaminophen (NORCO/VICODIN) 5-325 MG tablet Take 1-2 tablets by mouth every 6 (six) hours as needed for moderate pain or severe pain (pain score 4-6). Patient not taking: Reported on 05/13/2022 03/29/22   Love, Ivan Anchors, PA-C  midodrine (PROAMATINE) 5 MG tablet Take 5 mg by mouth 3 (three) times daily. 05/12/22   [provider]  Multiple Vitamin (MULTIVITAMIN WITH MINERALS) TABS tablet Take 1 tablet by mouth daily. Patient not taking: Reported on 05/13/2022 03/25/22   Bary Leriche, PA-C    ___________________________________________________________________________________________________ Physical Exam:    05/13/2022    7:00 PM 05/13/2022    6:30 PM 05/13/2022    6:28 PM  Vitals with BMI  Systolic  950 932  Diastolic  76 77  Pulse 79 88 84     1. General:  in No   Acute distress    Chronically ill   -appearing 2. Psychological: Alert and   Oriented to self 3. Head/ENT:    Dry Mucous Membranes  Head Non traumatic, neck supple                            Poor Dentition 4. SKIN:  decreased Skin turgor,  Skin clean Dry and intact no rash Multiple AK  5. Heart: Regular rate and rhythm no  Murmur, no Rub or gallop Pacemaker in place 6. Lungs:  no wheezes coarse crackles   7. Abdomen: Soft,  non-tender, Non distended  bowel sounds present 8. Lower extremities: no clubbing, cyanosis, no  edema 9. Neurologically Grossly intact, moving all 4 extremities equally   10. MSK: Normal range of motion    Chart has been reviewed  ______________________________________________________________________________________________  Assessment/Plan 76 y.o. male with medical  history significant of hypertension dementia diabetes, frequent falls failure to thrive prostate cancer, SDH, CKD  Admitted for CAP and hypoglycemia   Present on Admission:  CAP (community acquired pneumonia)  Acute metabolic encephalopathy  Chronic combined systolic and diastolic heart failure ([place])  Hypertension  Type 2 diabetes mellitus with hyperlipidemia ([place])  Hyperlipidemia  Dementia (HCC)  Anxiety  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 in situ  Chronic kidney disease, stage 3 unspecified (HCC)  FTT (failure to thrive) in adult  Hypoglycemia associated with type 2 diabetes mellitus ([place])  Protein-calorie malnutrition, severe  Urinary retention with incomplete bladder emptying     Acute metabolic encephalopathy In the setting of failure to thrive and hypoglycemia.  Blood sugars improved. At some point patient was endorsing some slurred speech with family thought this was secondary to patient being edentulous.  Patient is status post pacemaker MRI unable to perform CT head unremarkable continue to monitor for now.  Chronic combined systolic and diastolic heart failure (HCC) - currently appears to be slightly on the dry side, hold home diuretics for tonight and restart when appears euvolemic, carefuly follow fluid status and Cr   Hypertension Holding home medications Patient has been having episodes of hypotension instead we will continue midodrine 5 mg 3 times daily as well as Florinef  Type 2 diabetes mellitus with hyperlipidemia ([place]) Has been hypoglycemic continue to monitor serial CBG start on D5 half-normal after administration of thiamine  Hyperlipidemia Chronic stable continue Lipitor 80 mg a day  Dementia (HCC) Chronic continue to monitor Monitor for any evidence of sundowning given decreased p.o. and will hold off on Aricept and Namenda for now  Anxiety Given altered mental status held Ativan for tonight but if there is evidence of  agitation will need to restart  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 in situ Chronic stable in place we will hold off on MRI  CAP (community acquired pneumonia) Suspicious for pneumonia based on severe coughing increased shortness of breath and she has significant infiltrates  Treated with Rocephin azithromycin Obtain sputum cultures Check check strep and Legionella serologies Check MRSA  Chronic kidney disease, stage 3 unspecified ([place])  -chronic avoid nephrotoxic medications such as NSAIDs, Vanco Zosyn combo,  avoid hypotension, continue to follow renal function   FTT (failure to thrive) in adult Will need nutrition consult PT OT assessment patient likely will need placement Check prealbumin  Hypoglycemia associated with type 2 diabetes mellitus ([place]) In the setting of decreased p.o. intake start D5 half-normal and continue to follow administer thiamine  Protein-calorie malnutrition, severe Order nutritional consult and check prealbumin  Urinary retention with incomplete bladder emptying Patient is status post indwelling Foley catheter We will continue for now Urine appears to be dirty possibly secondary  to colonization.  No symptoms of UTI    Other plan as per orders.  DVT prophylaxis:  SCD     Code Status:    Code Status: Prior FULL CODE  as per patient   I had personally discussed CODE STATUS with patient and family     Family Communication:   Family   at  Bedside  plan of care was discussed  with  Wife,    Disposition Plan:     likely will need placement for rehabilitation                         Following barriers for discharge:                            Electrolytes corrected                                                             white count improving able to transition to PO antibiotics                             Will need to be able to tolerate PO                                                Would benefit from PT/OT  eval prior to DC  Ordered                   Swallow eval - SLP ordered                   Diabetes care coordinator                   Transition of care consulted                   Nutrition    consulted                                      Palliative care    consulted                                       Consults called: none   Admission status:  ED Disposition     ED Disposition  Admit   Condition  --   [place]: [place] [[id number]]  Level of Care: Telemetry [5]  Admit to tele based on following criteria: Other see comments  Comments: cap  May place patient in observation at [facility] or [place] if equivalent level of care is available:: No  Covid Evaluation: Asymptomatic - no recent exposure (last 10 days) testing not required  Diagnosis: CAP (community acquired pneumonia) [329924]  Admitting Physician: Toy Baker [[id number]]  Attending Physician: Toy Baker [[id number]]           Obs  Cyrus Ramsburg 05/13/2022, 11:45 PM    [facility]     after 2 AM please page floor coverage PA If 7AM-7PM, please contact the day team taking care of the patient using [email or web address]   Patient was evaluated in the context of the global COVID-19 pandemic, which necessitated consideration that the patient might be at risk for infection with the SARS-CoV-2 virus that causes COVID-19. Institutional protocols and algorithms that pertain to the evaluation of patients at risk for COVID-19 are in a state of rapid change based on information released by regulatory bodies including the CDC and federal and state organizations. These policies and algorithms were followed during the patient's care.

## 2022-05-13 NOTE — Assessment & Plan Note (Signed)
Order nutritional consult and check prealbumin

## 2022-05-13 NOTE — Subjective & Objective (Signed)
Patient has not been eating or drinking for unclear time discharge recently from rehab and since then has deteriorated became weaker status slurred speech and confusion on arrival of EMS CBG was down to 62 and he was given 1 amp of D50 with improving blood sugar up to 270 noted to be initially hypotensive 90s over 50s.  Patient has known history of dementia and diabetes and frequent falls Last severe fall was in May 2023 when he had a fractured hip and left shoulder he developed urinary retention and now has indwelling Foley catheter was discharged to rehab on May 22 and then discharged to home had another fall since then and was evaluated at [facility] had dislodgment of a plate in his shoulder and had to be reaffixed. Had another admission back in June 3 for generalized weakness and falls. Noted to have at home postural hypotension started on Florinef

## 2022-05-13 NOTE — Assessment & Plan Note (Signed)
Chronic stable continue Lipitor 80 mg a day

## 2022-05-13 NOTE — Assessment & Plan Note (Signed)
In the setting of failure to thrive and hypoglycemia.  Blood sugars improved. At some point patient was endorsing some slurred speech with family thought this was secondary to patient being edentulous.  Patient is status post pacemaker MRI unable to perform CT head unremarkable continue to monitor for now.

## 2022-05-13 NOTE — Assessment & Plan Note (Signed)
Chronic continue to monitor Monitor for any evidence of sundowning given decreased p.o. and will hold off on Aricept and Namenda for now

## 2022-05-13 NOTE — Assessment & Plan Note (Signed)
-  chronic avoid nephrotoxic medications such as NSAIDs, Vanco Zosyn combo,  avoid hypotension, continue to follow renal function  

## 2022-05-14 ENCOUNTER — Observation Stay (HOSPITAL_COMMUNITY): Payer: No Typology Code available for payment source

## 2022-05-14 DIAGNOSIS — R296 Repeated falls: Secondary | ICD-10-CM | POA: Diagnosis present

## 2022-05-14 DIAGNOSIS — R338 Other retention of urine: Secondary | ICD-10-CM | POA: Diagnosis present

## 2022-05-14 DIAGNOSIS — I130 Hypertensive heart and chronic kidney disease with heart failure and stage 1 through stage 4 chronic kidney disease, or unspecified chronic kidney disease: Secondary | ICD-10-CM | POA: Diagnosis present

## 2022-05-14 DIAGNOSIS — Z681 Body mass index (BMI) 19 or less, adult: Secondary | ICD-10-CM | POA: Diagnosis not present

## 2022-05-14 DIAGNOSIS — E43 Unspecified severe protein-calorie malnutrition: Secondary | ICD-10-CM | POA: Diagnosis present

## 2022-05-14 DIAGNOSIS — E1143 Type 2 diabetes mellitus with diabetic autonomic (poly)neuropathy: Secondary | ICD-10-CM | POA: Diagnosis present

## 2022-05-14 DIAGNOSIS — I428 Other cardiomyopathies: Secondary | ICD-10-CM | POA: Diagnosis present

## 2022-05-14 DIAGNOSIS — Z66 Do not resuscitate: Secondary | ICD-10-CM | POA: Diagnosis present

## 2022-05-14 DIAGNOSIS — I441 Atrioventricular block, second degree: Secondary | ICD-10-CM | POA: Diagnosis present

## 2022-05-14 DIAGNOSIS — N401 Enlarged prostate with lower urinary tract symptoms: Secondary | ICD-10-CM | POA: Diagnosis present

## 2022-05-14 DIAGNOSIS — Z7401 Bed confinement status: Secondary | ICD-10-CM | POA: Diagnosis not present

## 2022-05-14 DIAGNOSIS — Z515 Encounter for palliative care: Secondary | ICD-10-CM | POA: Diagnosis not present

## 2022-05-14 DIAGNOSIS — I5042 Chronic combined systolic (congestive) and diastolic (congestive) heart failure: Secondary | ICD-10-CM | POA: Diagnosis present

## 2022-05-14 DIAGNOSIS — Z95810 Presence of automatic (implantable) cardiac defibrillator: Secondary | ICD-10-CM | POA: Diagnosis not present

## 2022-05-14 DIAGNOSIS — N1832 Chronic kidney disease, stage 3b: Secondary | ICD-10-CM | POA: Diagnosis present

## 2022-05-14 DIAGNOSIS — F0394 Unspecified dementia, unspecified severity, with anxiety: Secondary | ICD-10-CM | POA: Diagnosis present

## 2022-05-14 DIAGNOSIS — E11649 Type 2 diabetes mellitus with hypoglycemia without coma: Secondary | ICD-10-CM | POA: Diagnosis present

## 2022-05-14 DIAGNOSIS — R531 Weakness: Secondary | ICD-10-CM | POA: Diagnosis not present

## 2022-05-14 DIAGNOSIS — I951 Orthostatic hypotension: Secondary | ICD-10-CM | POA: Diagnosis present

## 2022-05-14 DIAGNOSIS — F419 Anxiety disorder, unspecified: Secondary | ICD-10-CM | POA: Diagnosis not present

## 2022-05-14 DIAGNOSIS — J189 Pneumonia, unspecified organism: Secondary | ICD-10-CM | POA: Diagnosis present

## 2022-05-14 DIAGNOSIS — D631 Anemia in chronic kidney disease: Secondary | ICD-10-CM | POA: Diagnosis present

## 2022-05-14 DIAGNOSIS — Z20822 Contact with and (suspected) exposure to covid-19: Secondary | ICD-10-CM | POA: Diagnosis present

## 2022-05-14 DIAGNOSIS — I495 Sick sinus syndrome: Secondary | ICD-10-CM | POA: Diagnosis present

## 2022-05-14 DIAGNOSIS — G9341 Metabolic encephalopathy: Secondary | ICD-10-CM | POA: Diagnosis present

## 2022-05-14 DIAGNOSIS — R627 Adult failure to thrive: Secondary | ICD-10-CM | POA: Diagnosis present

## 2022-05-14 DIAGNOSIS — J156 Pneumonia due to other aerobic Gram-negative bacteria: Secondary | ICD-10-CM | POA: Diagnosis present

## 2022-05-14 DIAGNOSIS — F0393 Unspecified dementia, unspecified severity, with mood disturbance: Secondary | ICD-10-CM | POA: Diagnosis present

## 2022-05-14 LAB — BLOOD GAS, VENOUS
Acid-base deficit: 0.8 mmol/L (ref 0.0–2.0)
Bicarbonate: 23.4 mmol/L (ref 20.0–28.0)
O2 Saturation: 52 %
Patient temperature: 36.6
pCO2, Ven: 35 mmHg — ABNORMAL LOW (ref 44–60)
pH, Ven: 7.43 (ref 7.25–7.43)
pO2, Ven: <31 mmHg — CL (ref 32–45)

## 2022-05-14 LAB — GLUCOSE, CAPILLARY
Glucose-Capillary: 115 mg/dL — ABNORMAL HIGH (ref 70–99)
Glucose-Capillary: 122 mg/dL — ABNORMAL HIGH (ref 70–99)
Glucose-Capillary: 123 mg/dL — ABNORMAL HIGH (ref 70–99)
Glucose-Capillary: 136 mg/dL — ABNORMAL HIGH (ref 70–99)
Glucose-Capillary: 137 mg/dL — ABNORMAL HIGH (ref 70–99)
Glucose-Capillary: 142 mg/dL — ABNORMAL HIGH (ref 70–99)
Glucose-Capillary: 159 mg/dL — ABNORMAL HIGH (ref 70–99)
Glucose-Capillary: 161 mg/dL — ABNORMAL HIGH (ref 70–99)
Glucose-Capillary: 177 mg/dL — ABNORMAL HIGH (ref 70–99)

## 2022-05-14 LAB — COMPREHENSIVE METABOLIC PANEL
ALT: 6 U/L (ref 0–44)
AST: 13 U/L — ABNORMAL LOW (ref 15–41)
Albumin: 2.4 g/dL — ABNORMAL LOW (ref 3.5–5.0)
Alkaline Phosphatase: 100 U/L (ref 38–126)
Anion gap: 10 (ref 5–15)
BUN: 25 mg/dL — ABNORMAL HIGH (ref 8–23)
CO2: 21 mmol/L — ABNORMAL LOW (ref 22–32)
Calcium: 8.6 mg/dL — ABNORMAL LOW (ref 8.9–10.3)
Chloride: 109 mmol/L (ref 98–111)
Creatinine, Ser: 1.36 mg/dL — ABNORMAL HIGH (ref 0.61–1.24)
GFR, Estimated: 54 mL/min — ABNORMAL LOW (ref >60)
Glucose, Bld: 138 mg/dL — ABNORMAL HIGH (ref 70–99)
Potassium: 4.3 mmol/L (ref 3.5–5.1)
Sodium: 140 mmol/L (ref 135–145)
Total Bilirubin: 1 mg/dL (ref 0.3–1.2)
Total Protein: 6.4 g/dL — ABNORMAL LOW (ref 6.5–8.1)

## 2022-05-14 LAB — STREP PNEUMONIAE URINARY ANTIGEN: Strep Pneumo Urinary Antigen: NEGATIVE

## 2022-05-14 LAB — CBC
HCT: 33.3 % — ABNORMAL LOW (ref 39.0–52.0)
Hemoglobin: 10.2 g/dL — ABNORMAL LOW (ref 13.0–17.0)
MCH: 31.1 pg (ref 26.0–34.0)
MCHC: 30.6 g/dL (ref 30.0–36.0)
MCV: 101.5 fL — ABNORMAL HIGH (ref 80.0–100.0)
Platelets: 361 10*3/uL (ref 150–400)
RBC: 3.28 MIL/uL — ABNORMAL LOW (ref 4.22–5.81)
RDW: 14.6 % (ref 11.5–15.5)
WBC: 12.2 10*3/uL — ABNORMAL HIGH (ref 4.0–10.5)
nRBC: 0 % (ref 0.0–0.2)

## 2022-05-14 LAB — TSH: TSH: 0.761 u[IU]/mL (ref 0.350–4.500)

## 2022-05-14 LAB — OSMOLALITY: Osmolality: 300 mOsm/kg — ABNORMAL HIGH (ref 275–295)

## 2022-05-14 LAB — LACTIC ACID, PLASMA: Lactic Acid, Venous: 1.3 mmol/L (ref 0.5–1.9)

## 2022-05-14 LAB — VITAMIN B12: Vitamin B-12: 776 pg/mL (ref 180–914)

## 2022-05-14 LAB — IRON AND TIBC
Iron: 23 ug/dL — ABNORMAL LOW (ref 45–182)
Saturation Ratios: 14 % — ABNORMAL LOW (ref 17.9–39.5)
TIBC: 165 ug/dL — ABNORMAL LOW (ref 250–450)
UIBC: 142 ug/dL

## 2022-05-14 LAB — PROTIME-INR
INR: 1.1 (ref 0.8–1.2)
Prothrombin Time: 13.6 seconds (ref 11.4–15.2)

## 2022-05-14 LAB — AMMONIA: Ammonia: 15 umol/L (ref 9–35)

## 2022-05-14 LAB — FOLATE: Folate: 7.9 ng/mL (ref >5.9)

## 2022-05-14 LAB — FERRITIN: Ferritin: 341 ng/mL — ABNORMAL HIGH (ref 24–336)

## 2022-05-14 LAB — OSMOLALITY, URINE: Osmolality, Ur: 704 mOsm/kg (ref 300–900)

## 2022-05-14 LAB — MRSA NEXT GEN BY PCR, NASAL: MRSA by PCR Next Gen: NOT DETECTED

## 2022-05-14 LAB — PREALBUMIN: Prealbumin: 9.2 mg/dL — ABNORMAL LOW (ref 18–38)

## 2022-05-14 MED ORDER — ADULT MULTIVITAMIN W/MINERALS CH
1.0000 | ORAL_TABLET | Freq: Every day | ORAL | Status: DC
  Administered 2022-05-14 – 2022-05-15 (×2): 1 via ORAL
  Filled 2022-05-14 (×2): qty 1

## 2022-05-14 MED ORDER — LORATADINE 10 MG PO TABS
10.0000 mg | ORAL_TABLET | Freq: Every day | ORAL | Status: DC
Start: 2022-05-14 — End: 2022-05-15
  Administered 2022-05-14 – 2022-05-15 (×2): 10 mg via ORAL
  Filled 2022-05-14 (×2): qty 1

## 2022-05-14 MED ORDER — GUAIFENESIN-DM 100-10 MG/5ML PO SYRP
5.0000 mL | ORAL_SOLUTION | ORAL | Status: DC | PRN
  Administered 2022-05-14 – 2022-05-15 (×7): 5 mL via ORAL
  Filled 2022-05-14 (×7): qty 10

## 2022-05-14 MED ORDER — BOOST / RESOURCE BREEZE PO LIQD CUSTOM
1.0000 | ORAL | Status: DC
  Administered 2022-05-14: 1 via ORAL

## 2022-05-14 MED ORDER — ENSURE ENLIVE PO LIQD
237.0000 mL | Freq: Two times a day (BID) | ORAL | Status: DC
  Administered 2022-05-14: 237 mL via ORAL

## 2022-05-14 NOTE — Progress Notes (Addendum)
Manufacturing engineer [facility]) Hospital Liaison Note  Referral received for patient/family interest in [facility]. Chart under review by [facility] physician.   Hospice eligibility confirmed.  Plan is to transfer [facility]. [place] liaison will continue to follow.    Please call with any questions or concerns. Thank you  Roselee Nova, [facility] Liaison [phone]

## 2022-05-14 NOTE — Progress Notes (Signed)
Modified Barium Swallow Progress Note  Patient Details  Name: Kevin [name] MRN: [id number] Date of Birth: 1946-02-21  Today's Date: 05/14/2022  Modified Barium Swallow completed.  Full report located under Chart Review in the Imaging Section.  Brief recommendations include the following:  Clinical Impression  Patient presents with moderate oropharyngeal and minimal cervical esophageal dysphagia.   He has retained secretions in pharynx that mix with barium.  Oral phase delayed with impaired coordination and repeated A-P lingual dipping observed.  He piecemeals with liquids.  Impaired pharyngeal contraction results in retention across all consistencies - worse with liquids than solids and retention more pronounced at vallecular space. Various postures including chin tuck, right nor left head turn were helpful to prevent retention.  Pharyngeal swallow is timely fortunately - but pt does not sense retention and dry swallows nor cued "hock" helpful to clear.  Patient did not aspirate during MBS but is at risk due to his weakness/retention.  Cue to "swallow hard" may have improved oral transiting timing.  Pt did cough when penetrated to vocal cords - with secretions that mixed with barium.  Therefore, when he is subtly coughing and clearing his throat, he likely is penetrating to vocal folds.  Educated pt during testing.  Reviewed MBS flouro loops with wife including clinical reasoning for precautions.  Will follow up for dysphagia management.  Would encourage palliative consult given progressive decline and FTT.   Swallow Evaluation Recommendations       SLP Diet Recommendations: Dysphagia 3 (Mech soft) solids;Thin liquid   Liquid Administration via: Cup;Straw   Medication Administration: Crushed with puree   Supervision: Staff to assist with self feeding   Compensations: Slow rate;Small sips/bites;Multiple dry swallows after each bite/sip;Follow solids with liquid;Effortful swallow    Postural Changes: Remain semi-upright after after feeds/meals (Comment)   Oral Care Recommendations: Oral care BID   Other Recommendations: Have oral suction available  Kathleen Lime, MS [facility] SLP Acute Rehab Services Office [phone] Pager [phone]   Macario Golds 05/14/2022,3:09 PM

## 2022-05-14 NOTE — Progress Notes (Signed)
DM2Inpatient Diabetes Program Recommendations  AACE/ADA: New Consensus Statement on Inpatient Glycemic Control (2015)  Target Ranges:  Prepandial:   less than 140 mg/dL      Peak postprandial:   less than 180 mg/dL (1-2 hours)      Critically ill patients:  140 - 180 mg/dL   Lab Results  Component Value Date   GLUCAP 137 (H) 05/14/2022   HGBA1C 5.3 01/08/2022    Review of Glycemic Control  Diabetes history: DM2 Outpatient Diabetes medications: metformin 500 BID Current orders for Inpatient glycemic control: None  Mild hypo yesterday. Otherwise CBGs within goal. Poor oral intake likely cause of hypos. HgbA1C - 5.3%, down from 6.9%  Inpatient Diabetes Program Recommendations:    Consider discontinuation of metformin at discharge.   Continue to follow while inpatient.  Thank you. Lorenda Peck, RD, LDN, CDE Inpatient Diabetes Coordinator [phone]

## 2022-05-14 NOTE — Plan of Care (Signed)
  Problem: Health Behavior/Discharge Planning: Goal: Ability to manage health-related needs will improve Outcome: Progressing   

## 2022-05-14 NOTE — Progress Notes (Signed)
Initial Nutrition Assessment  DOCUMENTATION CODES:   Underweight  INTERVENTION:  - will order Boost Breeze once/day, each supplement provides 250 kcal and 9 grams of protein.  - continue Ensure Plus High Protein BID, each supplement provides 350 kcal and 20 grams of protein.  - will order 1 tablet multivitamin with minerals/day.  - will liberalize diet from Heart Healthy/Carb Modified to Regular.  - complete NFPE when feasible.   - if within [place], patient may benefit from placement of small bore NGT and initiation of tube feeding.     NUTRITION DIAGNOSIS:   Increased nutrient needs related to acute illness as evidenced by estimated needs.  GOAL:   Patient will meet greater than or equal to 90% of their needs  MONITOR:   PO intake, Supplement acceptance, Labs, Weight trends  REASON FOR ASSESSMENT:   Consult Assessment of nutrition requirement/status  ASSESSMENT:   76 y.o. male with medical history of HTN, dementia, DM, frequent falls, failure to thrive, prostate cancer, SDH, CKD, anemia, non-ischemic cardiomyopathy, hypercholesterolemia, agent orange exposure, and LBBB. He presented to the ED due to hypoglycemia. H&P outlines that patient has not been eating or drinking for an unknown amount of time. He was recently discharged from rehab and since that time has become increasingly weaker, developed slurred speech and increased confusion. His last known fall was in 03/2022 when he fractured his hip and L shoulder. He was admitted due to CAP.  Unable to see patient x2 attempts. Flow sheet documentation indicates patient ate 0% of breakfast today. Ensure ordered BID today but he has not yet been offered this supplement (based on information in orders).   He was seen by a RD when he was at inpatient rehab at [facility]; assessment date of 03/24/22. At that time patient met criteria for severe malnutrition in the context of chronic disease as evidenced by severe fat depletions and  severe muscle depletions. Suspect this is ongoing.   Weight today is 122 lb and weight on 5/9 was 141 lb. This indicates 19 lb weight loss (13.5% body weight) in the past 2 months; significant for time frame.   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  Medications reviewed; 400 mg megace BID started 7/7, 100 mg oral thiamine/day started 7/6.  IVF; D5-1/2 NS @ 75 mlhr (306 kcal/24 hrs).    NUTRITION - FOCUSED PHYSICAL EXAM:  Unable to complete.  Diet Order:   Diet Order             Diet regular Room service appropriate? Yes; Fluid consistency: Thin  Diet effective now                   EDUCATION NEEDS:   Not appropriate for education at this time  Skin:  Skin Assessment: Reviewed RN Assessment  Last BM:  PTA/unknown  Height:   Ht Readings from Last 1 Encounters:  05/14/22 6' 4" (1.93 m)    Weight:   Wt Readings from Last 1 Encounters:  05/14/22 55.2 kg     BMI:  Body mass index is 14.81 kg/m.  Estimated Nutritional Needs:  Kcal:  2000-2300 kcal Protein:  100-120 grams Fluid:  >/= 2.5 L/day     Jarome Matin, MS, RD, LDN, CNSC Registered Dietitian II Inpatient Clinical Nutrition RD pager # and on-call/weekend pager # available in [facility]

## 2022-05-14 NOTE — Evaluation (Signed)
Physical Therapy Evaluation Patient Details Name: Kevin [name] MRN: [id number] DOB: September 03, 1946 Today's Date: 05/14/2022  History of Present Illness   Patient is a 76 y/o male admitted to ED for failure to thrive including dementia, general weakness, and multiple falls. Pt attended rehab 5/9-5/22 for a fall, fracturing hip and Lt shoulder. Returned home. Admitted to [facility] 5/26 due to another fall, further injuring shoulders, s/p revision on 5/30 with sling for 6 weeks and NWB. 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    Clinical Impression  Pt presents with mobility, cognitive, and balance deficits due to above HPI. PTA pt had returned home after recent SNF stay for rehab. Pt has his wife to assist at home and typically using RW/rollator for household mobility, per wife pt has not stood since Sunday 05/08/22. Currently pt requires Mod assist +2 for bed mobility and Mod-Max +2 for stand pivot transfers. Patient unsafe to progress to gait this session. Pt is limited by severe LE muscle weakness and impaired cognition. Patient with recent revision to Rt shoulder on 5/30 and poor ROM, unsure if pt is still NWB on this UE. Recommending SNF upon DC from hospital and will continue to treat in acute setting.      Recommendations for follow up therapy are one component of a multi-disciplinary discharge planning process, led by the attending physician.  Recommendations may be updated based on patient status, additional functional criteria and insurance authorization.  Follow Up Recommendations Skilled nursing-short term rehab (<3 hours/day) Can patient physically be transported by private vehicle: No    Assistance Recommended at Discharge Frequent or constant Supervision/Assistance  Patient can return home with the following  Two people to help with walking and/or transfers;A lot of help with  bathing/dressing/bathroom;Assistance with cooking/housework;Assistance with feeding;Direct supervision/assist for medications management;Direct supervision/assist for financial management;Assist for transportation;Help with stairs or ramp for entrance    Equipment Recommendations None recommended by PT  Recommendations for Other Services       Functional Status Assessment Patient has had a recent decline in their functional status and demonstrates the ability to make significant improvements in function in a reasonable and predictable amount of time.     Precautions / Restrictions Precautions Precautions: Fall Restrictions Weight Bearing Restrictions: No      Mobility  Bed Mobility Overal bed mobility: Needs Assistance Bed Mobility: Supine to Sit     Supine to sit: Mod assist+2     General bed mobility comments: Pt required VC's for hand placement on the bed rail and instruction to pull, and to swing legs off EOB. Pt required physical assist with Bil legs to floor and mild trunk lift assist to sitting.    Transfers Overall transfer level: Needs assistance Equipment used: Rolling walker (2 wheels) Transfers: Sit to/from Stand, Bed to chair/wheelchair/BSC Sit to Stand: Mod assist, +2 physical assistance   Step pivot transfers: Max assist, +2 physical assistance       General transfer comment: Pt needed +2 for safety with trunk lift off bed and feet blocked. Required external support with RW. Pt with limited ability to bare UE weight on RW and demonstrated severe weakness in LE. Pt unable to weight shift to sequence steps and Max Assist to fully turn and safely lower to chair.    Ambulation/Gait                  Stairs  Wheelchair Mobility    Modified Rankin (Stroke Patients Only)       Balance Overall balance assessment: Needs assistance Sitting-balance support: Bilateral upper extremity supported, Feet supported Sitting balance-Leahy  Scale: Poor Sitting balance - Comments: Pt unable to sit without propping Bil UE on bed while feet supported. Postural control: Left lateral lean, Right lateral lean Standing balance support: Bilateral upper extremity supported, During functional activity, Reliant on assistive device for balance Standing balance-Leahy Scale: Zero Standing balance comment: Pt relied on Bil UE with RW and +2 therapist assist, LE shaking during standing.                             Pertinent Vitals/Pain Pain Assessment Pain Assessment: No/denies pain    Home Living Family/patient expects to be discharged to:: Private residence Living Arrangements: Spouse/significant other Available Help at Discharge: Family Type of Home: House Home Access: Stairs to enter Entrance Stairs-Rails: None Entrance Stairs-Number of Steps: 2   Home Layout: Two level;Able to live on main level with bedroom/bathroom Home Equipment: Rolling Walker (2 wheels);Rollator (4 wheels);Shower seat Additional Comments: Information patient provided matches home set up info from prior admission    Prior Function Prior Level of Function : Needs assist;History of Falls (last six months)  Cognitive Assist : Mobility (cognitive) Mobility (Cognitive): Step by step cues   Physical Assist : Mobility (physical) Mobility (physical): Bed mobility;Transfers;Gait   Mobility Comments: has not Stood since Sunday       Hand Dominance   Dominant Hand: Right    Extremity/Trunk Assessment   Upper Extremity Assessment Upper Extremity Assessment: Defer to OT evaluation    Lower Extremity Assessment Lower Extremity Assessment: Generalized weakness;RLE deficits/detail;LLE deficits/detail RLE Deficits / Details: Pt demonstrates severe weakness in LE. LLE Deficits / Details: Pt demonstrates severe weakness in LE.    Cervical / Trunk Assessment Cervical / Trunk Assessment: Kyphotic  Communication   Communication: No difficulties   Cognition Arousal/Alertness: Awake/alert Behavior During Therapy: WFL for tasks assessed/performed Overall Cognitive Status: Impaired/Different from baseline Area of Impairment: Memory, Following commands, Awareness                     Memory: Decreased recall of precautions, Decreased short-term memory Following Commands: Follows one step commands inconsistently, Follows multi-step commands inconsistently, Follows one step commands with increased time, Follows multi-step commands with increased time   Awareness: Emergent   General Comments: Pt could not recall location, stating "[facility]" . Stated the date was may and it was Tuesday (its Friday). Pt did not know where his teeth were and did not know why he's at the hospital from having several falls.        General Comments      Exercises     Assessment/Plan    PT Assessment Patient needs continued PT services  PT Problem List Decreased strength;Decreased activity tolerance;Decreased balance;Decreased mobility;Decreased cognition       PT Treatment Interventions Gait training;DME instruction;Functional mobility training;Therapeutic activities;Therapeutic exercise;Balance training    PT Goals (Current goals can be found in the Care Plan section)  Acute Rehab PT Goals Patient Stated Goal: To go home PT Goal Formulation: With patient Time For Goal Achievement: 05/28/22 Potential to Achieve Goals: Fair    Frequency Min 3X/week     Co-evaluation PT/OT/SLP Co-Evaluation/Treatment: Yes Reason for Co-Treatment: Necessary to address cognition/behavior during functional activity;For patient/therapist safety PT goals addressed during session: Mobility/safety with mobility  AM-PAC PT "6 Clicks" Mobility  Outcome Measure Help needed turning from your back to your side while in a flat bed without using bedrails?: A Little Help needed moving from lying on your back to sitting on the side of a flat bed without  using bedrails?: A Lot Help needed moving to and from a bed to a chair (including a wheelchair)?: Total Help needed standing up from a chair using your arms (e.g., wheelchair or bedside chair)?: A Lot Help needed to walk in hospital room?: Total Help needed climbing 3-5 steps with a railing? : Total 6 Click Score: 10    End of Session Equipment Utilized During Treatment: Gait belt Activity Tolerance: Patient limited by fatigue Patient left: in chair;with call bell/phone within reach;with chair alarm set Nurse Communication: Mobility status PT Visit Diagnosis: Unsteadiness on feet (R26.81);Other abnormalities of gait and mobility (R26.89);Repeated falls (R29.6);Muscle weakness (generalized) (M62.81);Adult, failure to thrive (R62.7)     Margie Ege, SPT [place] 05/14/2022, 9:59 AM

## 2022-05-14 NOTE — Progress Notes (Addendum)
PROGRESS NOTE    JUVENCIO VERDI  KYH:[id number] DOB: 1946/08/18 DOA: 05/13/2022 PCP: [name], MD    Brief Narrative:   Kevin [name] is a 76 y.o. 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  On EMS arrival, patient was noted to have a glucose of 62 and was administered D10; and transported to the ED for further evaluation.  Patient was recently admitted to [facility] with fractured hip and discharged to [facility] Mar 29, 2022.  Hospitalization he developed urinary retention and a Foley catheter was placed which has been present since.  Patient with another fall and return to [facility] due to dislodgment of a plate in his shoulder that had to be reaffixed in an additional admission in April 10, 2022 for generalized weakness and falls.  He was noted to have home postural hypotension and started on Florinef.  Patient also continued on metformin for his diabetes.  He apparently has not been able to walk at home and having a hard time since discharge from rehab.  In the ED, temperature 97.6 F, HR 106, RR 20, BP 71/49, SPO2 93% on nasal cannula.  WBCs 11.3, hemoglobin 12.5, platelets 466.  Sodium 142, potassium 4.2, chloride 108, CO2 22, glucose 73, BUN 24, creatinine 1.40.  AST 13, ALT 6, total bilirubin 0.9.  Ammonia level 15.  CK 33, high sensitive troponin 8.  Lactic acid 1.3.  Urinalysis with large leukocytes, negative nitrite, many bacteria, greater than 50 WBCs.  COVID-19 PCR negative.  Influenza A/B PCR  negative.  TSH 0.761, within normal limits.  CT head without contrast with no evidence of acute intracranial normality, chronic ischemia with old left occipital and cerebellar infarcts.CT C-spine with no acute cervical spine fracture.  Left elbow x-ray with no fracture, dorsal soft tissue swelling.  Chest x-ray with left lower lobe pneumonia, small parapneumonic effusion.  Blood cultures x2 and urine culture obtained.  Assessment & Plan:   Acute metabolic encephalopathy Patient presenting to ED with worsening confusion from his normal dementia baseline, was noted to have hypoglycemia in the setting of metformin use at home.  Patient was given D10 by EMS with improvement of his glucose and appears that his mentation is now back to baseline.  Patient is afebrile but with left lower lobe pneumonia likely contributing factor.  CT head without contrast with no acute findings. --Continue supportive care, treatment as below  Community-acquired pneumonia, suspect gram-negative organism Patient presenting with confusion from his typical baseline dementia, WBC count elevated 12.2 but afebrile.  Chest x-ray with left lower lobe infiltrate.  SPO2 stable on room air. --Azithromycin 500 mg IV q24h x 5 days --Ceftriaxone 2 g IV q24h x 5 days  Hypoglycemia Hx type 2 diabetes mellitus At baseline on metformin 500 mg p.o. twice daily.  Hemoglobin A1c 5.3 on 01/08/2022, well controlled.  On EMS arrival glucose noted to be 62 required D10. --Continue monitor CBGs closely -- Anticipate discontinuation of metformin on discharge  Chronic combined systolic and diastolic congestive heart failure Hx sick sinus syndrome s/p AICD/PPM Follows with cardiology outpatient, Dr. Rayann Heman.  Home medications include carvedilol 3.125 mg p.o. daily.  Patient with mild hypotension on admission in the setting of autonomic dysfunction now on Florinef and midodrine. --Holding carvedilol, likely will need to discontinue on  discharge --Continue monitor BP closely --Strict I's and O's and daily weights  Hx essential hypertension Hx postural hypotension/autonomic dysfunction Patient on carvedilol 3.125 mg p.o. daily, but with hypotension on admission.  Now with history of postural hypotension and autonomic dysfunction currently on Florinef and midodrine. --Discontinued carvedilol --Midodrine 5 mg PO TID --Florinef 0.1 mg p.o. daily --Continue to closely monitor blood pressure --Continue aspirin and statin  Dementia --Delirium precautions --Get up during the day --Encourage a familiar face to remain present throughout the day --Keep blinds open and lights on during daylight hours --Minimize the use of opioids/benzodiazepines --Donepezil 5 mg p.o. daily  Anxiety/depression: --Wellbutrin 150 mg p.o. daily --Hold home Ativan for now  HLD: Atorvastatin 80 mg p.o. daily  CKD stage IIIb Creatinine baseline 1.4-1.5.  Creatinine on admission 1.40; stable --Avoid nephrotoxins, renal dose all medication --BMP in a.m.  Chronic urinary retention BPH Patient presenting with Foley catheter in place, previous hospitalization May 2023 required Foley catheter placement. --Finasteride 5 mg p.o. daily --Continue Foley catheter, outpatient follow-up with urology  Severe protein calorie malnutrition Adult failure to thrive Body mass index is 14.81 kg/m. Patient with significant muscle wasting, fat loss on physical exam --Dietary consult  Weakness/deconditioning/debility/frequent falls: --PT/OT evaluation   DVT prophylaxis: SCDs Start: 05/13/22 2117    Code Status: Full Code Family Communication: No family present at bedside this morning  Disposition Plan:  Level of care: Telemetry Status is: Observation The patient remains OBS appropriate and will d/c before 2 midnights.    Consultants:  Palliative care: Pending  Procedures:  None  Antimicrobials:  Azithromycin 7/6>> Ceftriaxone  7/6>>   Subjective: Patient seen examined bedside, resting comfortably.  Lying in bed.  Pleasantly confused, RN present.  No family present this morning.  No specific complaints this morning.  States he utilizes a four-point cane at baseline, but cannot give any other further accurate information at this time.  Denies headache, no chest pain, no shortness of breath, no abdominal pain.  No acute concerns overnight per nursing staff.  Objective: Vitals:   05/14/22 0136 05/14/22 0200 05/14/22 0558 05/14/22 1022  BP: 112/69  116/68 112/64  Pulse: 91  72 73  Resp: '20  18 18  '$ Temp: 97.8 F (36.6 C)  97.8 F (36.6 C) 98 F (36.7 C)  TempSrc: Oral  Oral Oral  SpO2: 90%  91% 90%  Weight:  55.2 kg    Height:  '6\' 4"'$  (1.93 m)      Intake/Output Summary (Last 24 hours) at 05/14/2022 1045 Last data filed at 05/14/2022 0434 Gross per 24 hour  Intake 592.38 ml  Output --  Net 592.38 ml   Filed Weights   05/14/22 0200  Weight: 55.2 kg    Examination:  Physical Exam: GEN: NAD, alert, oriented to [facility]), but not time (2022) or Person (President: Obama), nor situation; thin/cachectic/chronically ill in appearance HEENT: NCAT, PERRL, EOMI, sclera clear, MMM, noted temporal muscle wasting PULM: CTAB w/o wheezes/crackles, normal respiratory effort, on room air CV: RRR w/o M/G/R, noted left chest PPM/AICD device GI: abd soft, NTND, NABS, no R/G/M GU: Foley catheter noted in place with yellow urine draining in collection canister MSK: no peripheral edema, moves all extremities independently NEURO: CN II-XII intact, no focal deficits,  Integumentary: dry/intact, no rashes  or wounds    Data Reviewed: I have personally reviewed following labs and imaging studies  CBC: Recent Labs  Lab 05/13/22 1725 05/14/22 0017  WBC 11.3* 12.2*  NEUTROABS 9.8*  --   HGB 12.5* 10.2*  HCT 40.5 33.3*  MCV 101.0* 101.5*  PLT 466* 229   Basic Metabolic Panel: Recent Labs  Lab 05/13/22 1827  05/14/22 0017  NA 142 140  K 4.2 4.3  CL 108 109  CO2 22 21*  GLUCOSE 73 138*  BUN 24* 25*  CREATININE 1.40* 1.36*  CALCIUM 8.7* 8.6*  MG 2.0  --   PHOS 3.9  --    GFR: Estimated Creatinine Clearance: 36.1 mL/min (A) (by C-G formula based on SCr of 1.36 mg/dL (H)). Liver Function Tests: Recent Labs  Lab 05/13/22 1827 05/14/22 0017  AST 13* 13*  ALT 6 6  ALKPHOS 105 100  BILITOT 0.9 1.0  PROT 6.7 6.4*  ALBUMIN 2.5* 2.4*   No results for input(s): "LIPASE", "AMYLASE" in the last 168 hours. Recent Labs  Lab 05/14/22 0017  AMMONIA 15   Coagulation Profile: Recent Labs  Lab 05/14/22 0017  INR 1.1   Cardiac Enzymes: Recent Labs  Lab 05/13/22 1827  CKTOTAL 33*   BNP (last 3 results) No results for input(s): "PROBNP" in the last 8760 hours. HbA1C: No results for input(s): "HGBA1C" in the last 72 hours. CBG: Recent Labs  Lab 05/14/22 0202 05/14/22 0416 05/14/22 0600 05/14/22 0752 05/14/22 1017  GLUCAP 115* 136* 142* 137* 159*   Lipid Profile: No results for input(s): "CHOL", "HDL", "LDLCALC", "TRIG", "CHOLHDL", "LDLDIRECT" in the last 72 hours. Thyroid Function Tests: Recent Labs    05/14/22 0017  TSH 0.761   Anemia Panel: Recent Labs    05/13/22 1725 05/14/22 0017  VITAMINB12  --  776  FOLATE  --  7.9  FERRITIN  --  341*  TIBC  --  165*  IRON  --  23*  RETICCTPCT 1.6  --    Sepsis Labs: Recent Labs  Lab 05/13/22 1725 05/14/22 0017  LATICACIDVEN 1.3 1.3    Recent Results (from the past 240 hour(s))  Resp Panel by RT-PCR (Flu A&B, Covid) Anterior Nasal Swab     Status: None   Collection Time: 05/13/22  8:00 PM   Specimen: Anterior Nasal Swab  Result Value Ref Range Status   SARS Coronavirus 2 by RT PCR NEGATIVE NEGATIVE Final    Comment: (NOTE) SARS-CoV-2 target nucleic acids are NOT DETECTED.  The SARS-CoV-2 RNA is generally detectable in upper respiratory specimens during the acute phase of infection. The lowest concentration  of SARS-CoV-2 viral copies this assay can detect is 138 copies/mL. A negative result does not preclude SARS-Cov-2 infection and should not be used as the sole basis for treatment or other patient management decisions. A negative result may occur with  improper specimen collection/handling, submission of specimen other than nasopharyngeal swab, presence of viral mutation(s) within the areas targeted by this assay, and inadequate number of viral copies(<138 copies/mL). A negative result must be combined with clinical observations, patient history, and epidemiological information. The expected result is Negative.  Fact Sheet for Patients:  EntrepreneurPulse.com.au  Fact Sheet for Healthcare Providers:  IncredibleEmployment.be  This test is no t yet approved or cleared by the [place] FDA and  has been authorized for detection and/or diagnosis of SARS-CoV-2 by FDA under an Emergency Use Authorization (EUA). This EUA will remain  in effect (meaning this test can be used)  for the duration of the COVID-19 declaration under Section 564(b)(1) of the Act, 21 U.S.C.section 360bbb-3(b)(1), unless the authorization is terminated  or revoked sooner.       Influenza A by PCR NEGATIVE NEGATIVE Final   Influenza B by PCR NEGATIVE NEGATIVE Final    Comment: (NOTE) The Xpert Xpress SARS-CoV-2/FLU/RSV plus assay is intended as an aid in the diagnosis of influenza from Nasopharyngeal swab specimens and should not be used as a sole basis for treatment. Nasal washings and aspirates are unacceptable for Xpert Xpress SARS-CoV-2/FLU/RSV testing.  Fact Sheet for Patients: EntrepreneurPulse.com.au  Fact Sheet for Healthcare Providers: IncredibleEmployment.be  This test is not yet approved or cleared by the [place] FDA and has been authorized for detection and/or diagnosis of SARS-CoV-2 by FDA under an Emergency Use  Authorization (EUA). This EUA will remain in effect (meaning this test can be used) for the duration of the COVID-19 declaration under Section 564(b)(1) of the Act, 21 U.S.C. section 360bbb-3(b)(1), unless the authorization is terminated or revoked.  Performed at [facility], [place] [address], [place], [place] [address]   MRSA Next Gen by PCR, Nasal     Status: None   Collection Time: 05/14/22 12:49 AM   Specimen: Nasal Mucosa; Nasal Swab  Result Value Ref Range Status   MRSA by PCR Next Gen NOT DETECTED NOT DETECTED Final    Comment: (NOTE) The GeneXpert MRSA Assay (FDA approved for NASAL specimens only), is one component of a comprehensive MRSA colonization surveillance program. It is not intended to diagnose MRSA infection nor to guide or monitor treatment for MRSA infections. Test performance is not FDA approved in patients less than 48 years old. Performed at [facility], [place] [address], [place],  [address]          Radiology Studies: DG Elbow Complete Left  Result Date: 05/13/2022 CLINICAL DATA:  [place], elbow swelling EXAM: LEFT ELBOW - COMPLETE 3+ VIEW COMPARISON:  03/09/2022 FINDINGS: Frontal, bilateral oblique, lateral views of the left elbow are obtained. No acute fracture, subluxation, or dislocation. Stable osteoarthritis. No joint effusion. Mild dorsal soft tissue swelling. IMPRESSION: 1. No acute fracture. 2. Stable osteoarthritis. 3. Dorsal soft tissue swelling. Electronically Signed   By: Randa Ngo M.D.   On: 05/13/2022 16:50   DG Chest 2 View  Result Date: 05/13/2022 CLINICAL DATA:  Failure to thrive, cough EXAM: CHEST - 2 VIEW COMPARISON:  03/09/2022 FINDINGS: Frontal and lateral views of the chest demonstrate an unremarkable cardiac silhouette. Multi lead pacer unchanged. Interval development of left lower lobe airspace disease, obscuring the posterior costophrenic angle. Small left pleural effusion is suspected. No  pneumothorax. No acute bony abnormalities. Postsurgical changes left humeral ORIF. IMPRESSION: 1. Left lower lobe pneumonia and small left parapneumonic effusion. Electronically Signed   By: Randa Ngo M.D.   On: 05/13/2022 16:49   CT Head Wo Contrast  Result Date: 05/13/2022 CLINICAL DATA:  Mental status change, unknown cause; Neck trauma (Age >= 65y). Fall. Slurred speech. EXAM: CT HEAD WITHOUT CONTRAST CT CERVICAL SPINE WITHOUT CONTRAST TECHNIQUE: Multidetector CT imaging of the head and cervical spine was performed following the standard protocol without intravenous contrast. Multiplanar CT image reconstructions of the cervical spine were also generated. RADIATION DOSE REDUCTION: This exam was performed according to the departmental dose-optimization program which includes automated exposure control, adjustment of the mA and/or kV according to patient size and/or use of iterative reconstruction technique. COMPARISON:  CT head and cervical spine 03/09/2022  FINDINGS: CT HEAD FINDINGS Brain: There is no evidence of an acute infarct, intracranial hemorrhage, mass, midline shift, or extra-axial fluid collection. Mild cerebral atrophy is unchanged. Hypodensities in the cerebral white matter bilaterally are unchanged and nonspecific but compatible with mild chronic small vessel ischemic disease. Small chronic infarcts are again noted in the left occipital lobe and both cerebellar hemispheres. Vascular: Calcified atherosclerosis at the skull base. No hyperdense vessel. Skull: No fracture or suspicious osseous lesion. Sinuses/Orbits: Visualized paranasal sinuses are clear. Trace chronic left mastoid effusion. Bilateral cataract extraction. Other: None. CT CERVICAL SPINE FINDINGS Alignment: Unchanged trace anterolisthesis of C7 on T1. Skull base and vertebrae: No acute fracture or suspicious osseous lesion. Soft tissues and spinal canal: No prevertebral fluid or swelling. No visible canal hematoma. Disc levels:  Unchanged cervical spondylosis. Interbody ankylosis at C5-6. Moderate spinal stenosis at C4-5 and C5-6 due to disc bulging, spurring, and posterior longitudinal ligament ossification. Mild right neural foraminal stenosis at C4-5 due to uncovertebral spurring and asymmetrically severe right facet arthrosis. Upper chest: Mild biapical pleuroparenchymal lung scarring. Other: None. IMPRESSION: 1. No evidence of acute intracranial abnormality. 2. 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 3. No acute cervical spine fracture. Electronically Signed   By: Logan Bores M.D.   On: 05/13/2022 16:42   CT Cervical Spine Wo Contrast  Result Date: 05/13/2022 CLINICAL DATA:  Mental status change, unknown cause; Neck trauma (Age >= 65y). Fall. Slurred speech. EXAM: CT HEAD WITHOUT CONTRAST CT CERVICAL SPINE WITHOUT CONTRAST TECHNIQUE: Multidetector CT imaging of the head and cervical spine was performed following the standard protocol without intravenous contrast. Multiplanar CT image reconstructions of the cervical spine were also generated. RADIATION DOSE REDUCTION: This exam was performed according to the departmental dose-optimization program which includes automated exposure control, adjustment of the mA and/or kV according to patient size and/or use of iterative reconstruction technique. COMPARISON:  CT head and cervical spine 03/09/2022 FINDINGS: CT HEAD FINDINGS Brain: There is no evidence of an acute infarct, intracranial hemorrhage, mass, midline shift, or extra-axial fluid collection. Mild cerebral atrophy is unchanged. Hypodensities in the cerebral white matter bilaterally are unchanged and nonspecific but compatible with mild chronic small vessel ischemic disease. Small chronic infarcts are again noted in the left occipital lobe and both cerebellar hemispheres. Vascular: Calcified atherosclerosis at the skull base. No hyperdense vessel. Skull: No fracture or suspicious osseous lesion.  Sinuses/Orbits: Visualized paranasal sinuses are clear. Trace chronic left mastoid effusion. Bilateral cataract extraction. Other: None. CT CERVICAL SPINE FINDINGS Alignment: Unchanged trace anterolisthesis of C7 on T1. Skull base and vertebrae: No acute fracture or suspicious osseous lesion. Soft tissues and spinal canal: No prevertebral fluid or swelling. No visible canal hematoma. Disc levels: Unchanged cervical spondylosis. Interbody ankylosis at C5-6. Moderate spinal stenosis at C4-5 and C5-6 due to disc bulging, spurring, and posterior longitudinal ligament ossification. Mild right neural foraminal stenosis at C4-5 due to uncovertebral spurring and asymmetrically severe right facet arthrosis. Upper chest: Mild biapical pleuroparenchymal lung scarring. Other: None. IMPRESSION: 1. No evidence of acute intracranial abnormality. 2. 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 3. No acute cervical spine fracture. Electronically Signed   By: Logan Bores M.D.   On: 05/13/2022 16:42        Scheduled Meds:  aspirin EC  81 mg Oral Daily   atorvastatin  80 mg Oral Daily   buPROPion  150 mg Oral Daily   clopidogrel  75 mg Oral Daily   feeding supplement  237 mL Oral BID BM   finasteride  5 mg Oral Daily   fludrocortisone  0.1 mg Oral Daily   guaiFENesin  600 mg Oral BID   loratadine  10 mg Oral Daily   megestrol  400 mg Oral BID WC   midodrine  5 mg Oral TID WC   tetrahydrozoline  1 drop Both Eyes TID   thiamine  100 mg Oral Daily   Continuous Infusions:  azithromycin     cefTRIAXone (ROCEPHIN)  IV 2 g (05/14/22 0942)   dextrose 5 % and 0.45% NaCl 75 mL/hr at 05/13/22 2200     LOS: 0 days    Time spent: 49 minutes spent on chart review, discussion with nursing staff, consultants, updating family and interview/physical exam; more than 50% of that time was spent in counseling and/or coordination of care.    Zlata Alcaide J [place] DO [facility] Available via Epic  secure chat 7am-7pm After these hours, please refer to coverage provider listed on [email or web address] 05/14/2022, 10:45 AM

## 2022-05-14 NOTE — TOC Initial Note (Signed)
Transition of Care [facility]) - Initial/Assessment Note    Patient Details  Name: Kevin [name] MRN: [id number] Date of Birth: 01-03-1946  Transition of Care [facility]) CM/SW Contact:    Lynnell Catalan, RN Phone Number: 05/14/2022, 3:13 PM  Clinical Narrative:                 [facility] consult for home hospice vs residential hospice. Spoke with wife at bedside. She states that she has decided that residential hospice at [facility] is what they need. Referral made to [facility]. TOC will continue to follow along.  Expected Discharge Plan: [place] Barriers to Discharge: Continued Medical Work up   Patient Goals and CMS Choice Patient states their goals for this hospitalization and ongoing recovery are:: Wife would like residential hospice CMS Medicare.gov Compare Post Acute Care list provided to:: Patient Choice offered to / list presented to : Patient  Expected Discharge Plan and Services Expected Discharge Plan: [place]   Discharge Planning Services: CM Consult Post Acute Care Choice: Residential Hospice Bed Living arrangements for the past 2 months: Single Family Home                                      Prior Living Arrangements/Services Living arrangements for the past 2 months: Single Family Home Lives with:: Spouse Patient language and need for interpreter reviewed:: Yes        Need for Family Participation in Patient Care: Yes (Comment) Care giver support system in place?: Yes (comment)   Criminal Activity/Legal Involvement Pertinent to Current Situation/Hospitalization: No - Comment as needed  Activities of Daily Living   ADL Screening (condition at time of admission) Patient's cognitive ability adequate to safely complete daily activities?: Yes Is the patient deaf or have difficulty hearing?: No Does the patient have difficulty seeing, even when wearing glasses/contacts?: No Does the patient have difficulty  concentrating, remembering, or making decisions?: Yes Patient able to express need for assistance with ADLs?: Yes Does the patient have difficulty dressing or bathing?: Yes Independently performs ADLs?: No Communication: Independent Dressing (OT): Needs assistance Grooming: Needs assistance Feeding: Needs assistance Bathing: Needs assistance Toileting: Needs assistance In/Out Bed: Needs assistance Does the patient have difficulty walking or climbing stairs?: Yes Weakness of Legs: Both Weakness of Arms/Hands: None  Permission Sought/Granted Permission sought to share information with : Facility Art therapist granted to share information with : Yes, Verbal Permission Granted     Permission granted to share info w AGENCY: [facility]        Emotional Assessment Appearance:: Appears older than stated age Attitude/Demeanor/Rapport: Lethargic Affect (typically observed): Flat Orientation: : Oriented to Self, Oriented to Place, Oriented to  Time, Oriented to Situation Alcohol / Substance Use: Not Applicable Psych Involvement: No (comment)  Admission diagnosis:  CAP (community acquired pneumonia) [J18.9] Patient Active Problem List   Diagnosis Date Noted   CAP (community acquired pneumonia) 05/13/2022   Hip fracture requiring operative repair ([place]) 03/16/2022   Protein-calorie malnutrition, severe 03/12/2022   Fracture of neck of left humerus, closed, initial encounter    Traumatic closed displaced fracture of base of neck of left femur, initial encounter ([place])    S/P hip replacement, left 03/10/2022   Hip fracture ([place]) 03/09/2022   Caregiver stress 02/12/2022   Hypoglycemia associated with type 2 diabetes mellitus ([place]) 01/21/2022   Constipation 01/21/2022   DNR (  do not resuscitate) 01/21/2022   DNI (do not intubate) 01/21/2022   FTT (failure to thrive) in adult 01/08/2022   Acute renal failure superimposed on stage 3a chronic kidney disease ([place])  24/82/5003   Acute metabolic encephalopathy 70/48/8891   High anion gap metabolic acidosis 69/45/0388   Depression 01/07/2022   Dementia ([place]) 01/07/2022   Hypotension 11/26/2021   UTI (urinary tract infection) 11/26/2021   Urinary retention with incomplete bladder emptying 11/26/2021   SDH (subdural hematoma) ([place]) 11/10/2021   Subdural hematoma ([place]) 11/05/2021   Carcinoma of prostate ([place]) 11/05/2021   Chronic kidney disease, stage 3 unspecified ([place]) 11/05/2021   Malnutrition of moderate degree 10/28/2021   Fracture of multiple pubic rami with routine healing, right 10/24/2021   Fall 10/21/2021   Pelvic hematoma in male 10/21/2021   Pubic ramus fracture ([place]) 10/21/2021   Acute blood loss anemia 10/21/2021   Acute COVID-19 06/16/2021   Chronic combined systolic and diastolic heart failure ([place]) 09/28/2017   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 in situ 09/28/2017   Hx of adenomatous colonic polyps 10/20/2016   Malignant neoplasm of prostate ([place]) 82/80/0349   Chronic systolic dysfunction of left ventricle 01/22/2015   Left bundle branch block 11/24/2014   Sick sinus syndrome ([place]) 11/24/2014   Nonischemic cardiomyopathy ([place]) 11/23/2013   Ventricular tachycardia ([place]) 11/23/2013   Type 2 diabetes mellitus with hyperlipidemia ([place]) 08/27/2013   Hyperlipidemia 08/27/2013   Condyloma 05/14/2013   Penile abnormality 05/05/2013   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/01/2013   Penile mass 02/19/2013   Hypertension 10/16/2012   Rectal discharge 07/14/2012   Jaw pain 07/14/2012   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   Type 2 diabetes mellitus ([place]) 04/16/2011   Insomnia 04/16/2011   Anxiety 04/16/2011   PCP:  [name], MD Pharmacy:   [facility], [place] - [place] AT [facility] OF [address] & [address] [place] [address] Phone: [phone] Fax: [phone]  [facility], [place] - [place]  [place] [address] [place] [address] Phone: [phone] Fax: [phone]     Social Determinants of Health ([place]) Interventions    Readmission Risk Interventions    01/11/2022    1:27 PM  Readmission Risk Prevention Plan  Transportation Screening Complete  PCP or Specialist Appt within 3-5 Days Complete  HRI or [place] Complete  Social Work Consult for [place] Planning/Counseling Complete  Palliative Care Screening Not Applicable  Medication Review Press photographer) Complete

## 2022-05-14 NOTE — Evaluation (Signed)
Occupational Therapy Evaluation Patient Details Name: Kevin [name] MRN: [id number] DOB: 05-Sep-1946 Today's Date: 05/14/2022   History of Present Illness Pt is a 76 y.o. male admitted 03/09/22 after syncopal episode and fall while ambulating without his RW. Pt sustained L humeral and L femoral neck fxs. S/p L hip hemiarthroplasty 5/3. S/p L shoulder ORIF 5/4. PMH includes dementia, DM2, prostate CA, LBBB, nonischemic cardiomyopathy, pacemaker; of note, multiple falls with recent admissions to acute and [facility] (11/2021). S/p ORIF with new complication s/p revision on 5/30.   Clinical Impression   Kevin [name] is a 76 year old man admitted to hospital with above medical history and presents with generalized weakness, decreased activity tolerance, impaired balance, cognitive deficits, non-functional UE due to unknown weight bearing status from recent surgery and pain. Patient complained of dizziness with sitting though BP WFL. Suspect due to not being out of bed for 5 days. Is unable to use LUE functionally. Patient needing mod assist x 2 to stand with walker but unabel to effectively use walker (bear down through arms). Needed mod x 2 to pivot to chair. He is unable to take functional steps and maintain upright position in standing and ending up with his feet too far forward and his butt too far back. Patient needs increased assistance with ADLs including max-total assist for LB ADLs and toileting. Patient will benefit from skilled OT services while in hospital to improve deficits and learn compensatory strategies to reduce caregiver burden.  Recommend short term rehab at discharge.      Recommendations for follow up therapy are one component of a multi-disciplinary discharge planning process, led by the attending physician.  Recommendations may be updated based on patient status, additional functional criteria and insurance authorization.   Follow Up Recommendations  Skilled nursing-short term  rehab (<3 hours/day)    Assistance Recommended at Discharge Frequent or constant Supervision/Assistance  Patient can return home with the following Two people to help with walking and/or transfers;A lot of help with bathing/dressing/bathroom;Assistance with cooking/housework;Assistance with feeding;Direct supervision/assist for medications management;Direct supervision/assist for financial management;Assist for transportation;Help with stairs or ramp for entrance    Functional Status Assessment  Patient has had a recent decline in their functional status and/or demonstrates limited ability to make significant improvements in function in a reasonable and predictable amount of time  Equipment Recommendations  None recommended by OT    Recommendations for Other Services       Precautions / Restrictions Precautions Precautions: Fall Restrictions Weight Bearing Restrictions: Yes Other Position/Activity Restrictions: Per ortho progress note on 6/1: Weightbearing status NWB to the LUE, sling for comfort. Range of motion in shoulder, elbow, wrist OK      Mobility Bed Mobility Overal bed mobility: Needs Assistance Bed Mobility: Supine to Sit     Supine to sit: Mod assist     General bed mobility comments: Pt required VC's for hand placement on the bed rail and instruction to pull, and to swing legs off EOB. Pt required physical assist with Bil legs to floor and mild trunk lift assist to sitting. Complained of dizziness at edge of bed    Transfers Overall transfer level: Needs assistance Equipment used: Rolling walker (2 wheels) Transfers: Sit to/from Stand, Bed to chair/wheelchair/BSC Sit to Stand: Mod assist, +2 physical assistance     Step pivot transfers: Mod assist     General transfer comment: Pt needed +2 for safety with trunk lift off bed and feet blocked. Required external support with RW.  Pt unable to bare UE weight on RW and demonstrated severe weakness in LE. removed  walker and assisted with step/turn in to recliner.      Balance Overall balance assessment: Needs assistance Sitting-balance support: Bilateral upper extremity supported, Feet supported Sitting balance-Leahy Scale: Poor Sitting balance - Comments: Pt unable to sit without propping Bil UE on bed while feet supported.   Standing balance support: During functional activity, Reliant on assistive device for balance Standing balance-Leahy Scale: Poor Standing balance comment: Pt relied on Bil UE with RW and +2 therapist assist, LE shaking during standing.                           ADL either performed or assessed with clinical judgement   ADL Overall ADL's : Needs assistance/impaired Eating/Feeding: Set up;Sitting Eating/Feeding Details (indicate cue type and reason): able to feed himself with right hand Grooming: Set up;Sitting Grooming Details (indicate cue type and reason): with right hand Upper Body Bathing: Moderate assistance;Sitting   Lower Body Bathing: Maximal assistance;Sitting/lateral leans   Upper Body Dressing : Moderate assistance;Sitting   Lower Body Dressing: Total assistance;Sit to/from stand;+2 for physical assistance Lower Body Dressing Details (indicate cue type and reason): +2 for sit to stand, +1 for bed level Toilet Transfer: +2 for physical assistance;Moderate assistance;BSC/3in1;Stand-pivot   Toileting- Clothing Manipulation and Hygiene: Total assistance;Sit to/from stand       Functional mobility during ADLs: Moderate assistance General ADL Comments: Attempted to use RW but patient unable to use upper extremities effectively on it, mod x2 to pivot.     Vision   Vision Assessment?: No apparent visual deficits     Perception     Praxis      Pertinent Vitals/Pain Pain Assessment Pain Assessment: No/denies pain     Hand Dominance Right   Extremity/Trunk Assessment Upper Extremity Assessment Upper Extremity Assessment: RUE  deficits/detail;LUE deficits/detail RUE Deficits / Details: WFL ROm, grossly 4/5 strength RUE Sensation: WNL RUE Coordination: WNL LUE Deficits / Details: Impaired shoulder AROM to approx 60 degrees. Unsure of WB status and need for sling. Suspect limited till maybe 7/17??. Funtional ROM and strength of elbow, wrist and hand LUE Sensation: WNL LUE Coordination: decreased gross motor   Lower Extremity Assessment Lower Extremity Assessment: Defer to PT evaluation RLE Deficits / Details: Pt demonstrates severe weakness in LE. LLE Deficits / Details: Pt demonstrates severe weakness in LE.   Cervical / Trunk Assessment Cervical / Trunk Assessment: Kyphotic   Communication Communication Communication: No difficulties   Cognition Arousal/Alertness: Awake/alert Behavior During Therapy: WFL for tasks assessed/performed Overall Cognitive Status: No family/caregiver present to determine baseline cognitive functioning                         Following Commands: Follows one step commands consistently, Follows multi-step commands inconsistently   Awareness: Emergent   General Comments: Hx of dementia so exhibits memory deficits.     General Comments       Exercises     Shoulder Instructions      Home Living Family/patient expects to be discharged to:: Private residence Living Arrangements: Spouse/significant other Available Help at Discharge: Family Type of Home: House Home Access: Stairs to enter [employer] of Steps: 2 Entrance Stairs-Rails: None Home Layout: Two level;Able to live on main level with bedroom/bathroom     Bathroom Shower/Tub: Occupational psychologist: Handicapped height Bathroom Accessibility: Yes  Home Equipment: Conservation officer, nature (2 wheels);Rollator (4 wheels);Shower seat   Additional Comments: Information patient provided matches home set up info from prior admission      Prior Functioning/Environment Prior Level of  Function : Needs assist;History of Falls (last six months)  Cognitive Assist : Mobility (cognitive) Mobility (Cognitive): Step by step cues   Physical Assist : Mobility (physical) Mobility (physical): Bed mobility;Transfers;Gait   Mobility Comments: has not Stood since Sunday ADLs Comments: Intermittent assist from wife for bathing and LB dressing        OT Problem List: Decreased strength;Decreased range of motion;Decreased activity tolerance;Impaired balance (sitting and/or standing);Decreased cognition;Decreased coordination;Decreased safety awareness;Decreased knowledge of use of DME or AE;Decreased knowledge of precautions;Pain;Impaired UE functional use      OT Treatment/Interventions: Self-care/ADL training;Therapeutic exercise;Neuromuscular education;DME and/or AE instruction;Cognitive remediation/compensation;Therapeutic activities;Balance training;Patient/family education    OT Goals(Current goals can be found in the care plan section) Acute Rehab OT Goals Patient Stated Goal: to transfer safely and easier OT Goal Formulation: With family Time For Goal Achievement: 05/28/22 Potential to Achieve Goals: Fair  OT Frequency: Min 2X/week    Co-evaluation PT/OT/SLP Co-Evaluation/Treatment: Yes (co-eval) Reason for Co-Treatment: Necessary to address cognition/behavior during functional activity;For patient/therapist safety PT goals addressed during session: Mobility/safety with mobility        AM-PAC OT "6 Clicks" Daily Activity     Outcome Measure Help from another person eating meals?: A Little Help from another person taking care of personal grooming?: A Little Help from another person toileting, which includes using toliet, bedpan, or urinal?: Total Help from another person bathing (including washing, rinsing, drying)?: A Lot Help from another person to put on and taking off regular upper body clothing?: A Lot Help from another person to put on and taking off regular  lower body clothing?: Total 6 Click Score: 12   End of Session Equipment Utilized During Treatment: Rolling walker (2 wheels);Gait belt Nurse Communication: Mobility status  Activity Tolerance: Patient limited by fatigue Patient left: in chair;with call bell/phone within reach;with chair alarm set  OT Visit Diagnosis: Other abnormalities of gait and mobility (R26.89);Unsteadiness on feet (R26.81);Dizziness and giddiness (R42);Muscle weakness (generalized) (M62.81);Other symptoms and signs involving cognitive function                Time: 7741-2878 OT Time Calculation (min): 38 min Charges:  OT General Charges $OT Visit: 1 Visit OT Evaluation $OT Eval Low Complexity: 1 Low  Lillyona Polasek, OTR/L [place]  Office [phone] Pager: [place] 05/14/2022, 11:34 AM

## 2022-05-14 NOTE — Evaluation (Signed)
Clinical/Bedside Swallow Evaluation Patient Details  Name: Kevin [name] MRN: [id number] Date of Birth: June 18, 1946  Today's Date: 05/14/2022 Time: SLP Start Time (ACUTE ONLY): 1210 SLP Stop Time (ACUTE ONLY): 1300 SLP Time Calculation (min) (ACUTE ONLY): 50 min  Past Medical History:  Past Medical History:  Diagnosis Date   Agent orange exposure    in [place]   Anemia    Dementia ([place])    DNI (do not intubate) 01/21/2022   DNR (do not resuscitate) 01/21/2022   Heart block AV second degree    a. s/p PPM implant with subsequent CRTD upgrade   High cholesterol    History of colon polyps    LBBB (left bundle branch block)    Nonischemic cardiomyopathy ([place])    a. MDT CRTD upgrade 2016   Pacemaker 08/27/2013   Dual-chamber Medtronic Adapta implanted January 2013 for bradycardia with alternating bundle branch block and 2:1 atrioventricular block    Prostate cancer ([place])    "not been treated yet; on a wait and see" (01/22/2015)   Type II diabetes mellitus ([place])    Past Surgical History:  Past Surgical History:  Procedure Laterality Date   BASAL CELL CARCINOMA EXCISION     "back, face, neck"   BI-VENTRICULAR IMPLANTABLE CARDIOVERTER DEFIBRILLATOR UPGRADE N/A 01/22/2015   MDT CRTD upgrade by Dr Rayann Heman   BIV ICD GENERATOR CHANGEOUT N/A 08/28/2020   Procedure: BIV ICD GENERATOR CHANGEOUT;  Surgeon: [name], MD;  Location: [place] [facility];  Service: Cardiovascular;  Laterality: N/A;   CARDIAC CATHETERIZATION  01/27/2012   normal coronaries, dilated LV c/w nonischemic cardiomyopathy   EXAMINATION UNDER ANESTHESIA  04/06/2012   Procedure: EXAM UNDER ANESTHESIA;  Surgeon: Marcello Moores A. Cornett, MD;  Location: [place];  Service: General;  Laterality: N/A;   HIP ARTHROPLASTY Left 03/10/2022   Procedure: ARTHROPLASTY BIPOLAR HIP (HEMIARTHROPLASTY);  Surgeon: Willaim Sheng, MD;  Location: [place];  Service: Orthopedics;  Laterality: Left;   INCISION AND DRAINAGE PERIRECTAL ABSCESS  ~  2010; 2015   INGUINAL HERNIA REPAIR Left 1980   IR ANGIOGRAM PELVIS SELECTIVE OR SUPRASELECTIVE  10/20/2021   IR ANGIOGRAM VISCERAL SELECTIVE  10/20/2021   IR EMBO ART  VEN HEMORR LYMPH EXTRAV  INC GUIDE ROADMAPPING  10/20/2021   IR US GUIDE VASC ACCESS LEFT  10/20/2021   LEFT HEART CATHETERIZATION WITH CORONARY ANGIOGRAM N/A 01/27/2012   Procedure: LEFT HEART CATHETERIZATION WITH CORONARY ANGIOGRAM;  Surgeon: Troy Sine, MD;  Location: [facility] C[facility];  Service: Cardiovascular;  Laterality: N/A;   NM MYOCAR PERF WALL MOTION  01/21/2012   mod-severe perfusion defect due to infarct/scar w/mild perinfarct ischemia in the apical,basal inferoseptal,basal inferior,mid inferoseptal,midinferior and apical inferior regions   ORIF SHOULDER FRACTURE Left 03/11/2022   Procedure: OPEN REDUCTION INTERNAL FIXATION (ORIF) SHOULDER FRACTURE;  Surgeon: Altamese Anthon, MD;  Location: [place];  Service: Orthopedics;  Laterality: Left;   pacemaker battery     PERMANENT PACEMAKER INSERTION N/A 12/01/2011   Medtronic implanted by Dr Sallyanne Kuster   PROSTATE BIOPSY     PROSTATE BIOPSY     shrapnel surgery     "in [place]"   TONSILLECTOMY     HPI:  Patient is a 76 yo male adm to [facility] with frequent falls, FTT, hypoglycemia and CAP.  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  Neck imaging showed C4-C5, C5-C6 disc bulging, spurring.  Spouse reports pt has had a pogresive decline -  with fairly recent admit to [facility] for shoulder pain - s/p repeat surgery - in hospital for 2 weeks and then dc'd to rehab.  He was dc'd home from rehab early July.  She reports he has not been eating anything - last time consumed sausage on Monday.  Now has failure to thrive.  Since he was recently discharged from rehab (unknown reason), he has become weaker. Since Monday he has had slurred speech and his orientation was questionable. CXR Left lower lobe pneumonia and small left parapneumonic  effusion.     Swallow evaluation ordered due to concerns that dysphagia may be contributing to weight loss.    Assessment / Plan / Recommendation  Clinical Impression  Pt seen with spouse, [name], at bedside.  [name] reports pt's spech is not as clear as normal.  After oral care with dental brushing, oral suction, provided pt with po trials.  He is noted to have weakness, ? pharyngeal weakness and potential impaired UES opening.  Immediate or delayed cough observed across all consistencies - with multiple audible swallows, concerning for weak muscular contraction, retention and potential airway infiltration.  Pt did not appear to tolerated nectar better than thin liquids.  [name], HCPOA, educated to lack of evidence re: feeding tubes and people with dementia - and she advised she would not want feeding tube for him.  Reviewed options of continuing po diet with precautions or proceeding with MBS.  Discussed risk/benefit of MBS - including may diagnose severe dysphagia that may not improve.  Pt is able to follow directions but unfortunately his cough is weak - and reviewed these concerns with wife and patient.  She would like to proceed with MBS to determine if any po is better than other.  Dysphagia long term but significantly worse in last 2 months per wife with progressive weight loss and overall decline and poor intake. Suspect chronic dysphagia due to h/o CVA, deconditioning. Ill fitting dentures due to weight loss and ? appearance of abrasion posterior oral cavity, soft palate.   Pt also with C4-C6, C5-C6 disc bulging and spurring - that may impair clearance.    She says she does not care if insurance will cover, she wants him to have the test.  MBS planned this pm with wife attending.  Order obtained, thank you. SLP Visit Diagnosis: Dysphagia, oropharyngeal phase (R13.12);Dysphagia, unspecified (R13.10);Dysphagia, pharyngoesophageal phase (R13.14)    Aspiration Risk  Risk for inadequate  nutrition/hydration;Moderate aspiration risk    Diet Recommendation Other (Comment) (defer to mbs)   Medication Administration: Crushed with puree Compensations: Slow rate;Small sips/bites;Multiple dry swallows after each bite/sip Postural Changes: Remain upright for at least 30 minutes after po intake;Seated upright at 90 degrees    Other  Recommendations Oral Care Recommendations: Oral care QID    Recommendations for follow up therapy are one component of a multi-disciplinary discharge planning process, led by the attending physician.  Recommendations may be updated based on patient status, additional functional criteria and insurance authorization.  Follow up Recommendations Follow physician's recommendations for discharge plan and follow up therapies      Assistance Recommended at Discharge Frequent or constant Supervision/Assistance  Functional Status Assessment Patient has had a recent decline in their functional status and/or demonstrates limited ability to make significant improvements in function in a reasonable and predictable amount of time  Frequency and Duration min 1 x/week  2 weeks       Prognosis Prognosis for Safe Diet Advancement: Guarded Barriers to Reach Goals: Time post  onset;Severity of deficits;Cognitive deficits      Swallow Study   General Date of Onset: 05/14/22 HPI: Patient is a 76 yo male adm to [facility] with frequent falls, FTT, hypoglycemia and CAP.  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  Neck imaging showed C4-C5, C5-C6 disc bulging, spurring.  Spouse reports pt has had a pogresive decline - with fairly recent admit to [facility] for shoulder pain - s/p repeat surgery - in hospital for 2 weeks and then dc'd to rehab.  He was dc'd home from rehab early July.  She reports he has not been eating anything - last time consumed sausage on Monday.  Now has failure to thrive.  Since he was recently  discharged from rehab (unknown reason), he has become weaker. Since Monday he has had slurred speech and his orientation was questionable. CXR Left lower lobe pneumonia and small left parapneumonic effusion.     Swallow evaluation ordered due to concerns that dysphagia may be contributing to weight loss. Type of Study: Bedside Swallow Evaluation Previous Swallow Assessment: wife and pt deny pt having prior swallow evaluation Diet Prior to this Study: Regular;Thin liquids Temperature Spikes Noted: No Respiratory Status: Room air History of Recent Intubation: No Behavior/Cognition: Other (Comment);Requires cueing;Distractible (pt became sleepy during session, saying "I could go to sleep" and closing his eyes) Oral Care Completed by SLP: Yes (with toothbrush and oral suctioning) Oral Cavity - Dentition: Dentures, top;Other (Comment) (lower partial, all ill fitting) Self-Feeding Abilities: Needs assist Patient Positioning: Upright in bed Baseline Vocal Quality: Low vocal intensity (wet, gurgly voice noted at times that does not consistently clear) Volitional Cough: Weak;Congested;Other (Comment) (nonproductive) Volitional Swallow: Able to elicit (with effort)    Oral/Motor/Sensory Function Overall Oral Motor/Sensory Function: Generalized oral weakness   Ice Chips Ice chips: Impaired Presentation: Spoon Oral Phase Impairments: Reduced lingual movement/coordination Oral Phase Functional Implications: Prolonged oral transit Pharyngeal Phase Impairments: Suspected delayed Swallow;Multiple swallows   Thin Liquid Thin Liquid: Impaired Presentation: Cup;Self Fed;Spoon;Straw Oral Phase Functional Implications: Prolonged oral transit;Oral holding Pharyngeal  Phase Impairments: Suspected delayed Swallow;Multiple swallows;Cough - Immediate;Cough - Delayed;Throat Clearing - Immediate;Throat Clearing - Delayed    Nectar Thick Nectar Thick Liquid: Impaired Presentation: Self Fed;Cup Oral Phase  Impairments: Other (comment);Reduced labial seal;Poor awareness of bolus Oral phase functional implications: Prolonged oral transit;Oral holding Pharyngeal Phase Impairments: Suspected delayed Swallow;Multiple swallows;Cough - Delayed;Throat Clearing - Delayed   Honey Thick Honey Thick Liquid: Not tested   Puree Puree: Impaired Presentation: Self Fed;Spoon Oral Phase Impairments: Reduced lingual movement/coordination Oral Phase Functional Implications: Prolonged oral transit Pharyngeal Phase Impairments: Cough - Delayed   Solid     Solid: Impaired Oral Phase Functional Implications: Prolonged oral transit Pharyngeal Phase Impairments: Suspected delayed Swallow;Cough - Delayed      Macario Golds 05/14/2022,1:36 PM  Kathleen Lime, MS [facility] SLP Acute Rehab Services Office [phone] Pager [phone]

## 2022-05-14 NOTE — Telephone Encounter (Signed)
Left confidential vm for Sharyn Lull okay for orders.

## 2022-05-15 LAB — BASIC METABOLIC PANEL
Anion gap: 8 (ref 5–15)
BUN: 13 mg/dL (ref 8–23)
CO2: 22 mmol/L (ref 22–32)
Calcium: 8.5 mg/dL — ABNORMAL LOW (ref 8.9–10.3)
Chloride: 109 mmol/L (ref 98–111)
Creatinine, Ser: 0.84 mg/dL (ref 0.61–1.24)
GFR, Estimated: >60 mL/min (ref >60)
Glucose, Bld: 125 mg/dL — ABNORMAL HIGH (ref 70–99)
Potassium: 3.1 mmol/L — ABNORMAL LOW (ref 3.5–5.1)
Sodium: 139 mmol/L (ref 135–145)

## 2022-05-15 LAB — CBC
HCT: 31.2 % — ABNORMAL LOW (ref 39.0–52.0)
Hemoglobin: 9.6 g/dL — ABNORMAL LOW (ref 13.0–17.0)
MCH: 30.8 pg (ref 26.0–34.0)
MCHC: 30.8 g/dL (ref 30.0–36.0)
MCV: 100 fL (ref 80.0–100.0)
Platelets: 284 10*3/uL (ref 150–400)
RBC: 3.12 MIL/uL — ABNORMAL LOW (ref 4.22–5.81)
RDW: 14.3 % (ref 11.5–15.5)
WBC: 9.5 10*3/uL (ref 4.0–10.5)
nRBC: 0 % (ref 0.0–0.2)

## 2022-05-15 LAB — URINE CULTURE: Culture: >=100000 — AB

## 2022-05-15 LAB — GLUCOSE, CAPILLARY
Glucose-Capillary: 136 mg/dL — ABNORMAL HIGH (ref 70–99)
Glucose-Capillary: 111 mg/dL — ABNORMAL HIGH (ref 70–99)
Glucose-Capillary: 88 mg/dL (ref 70–99)

## 2022-05-15 NOTE — Progress Notes (Signed)
Patient being transferred to [facility] vis EMS. Report called to [facility] place at 1345. Patient family at bedside and aware of transfer. Personal belongings and patient packet sent with EMS.

## 2022-05-15 NOTE — Discharge Summary (Signed)
Physician Discharge Summary  Kevin [name] HKV:[id number] DOB: 01-Jul-1946 DOA: 05/13/2022  PCP: [name], MD  Admit date: 05/13/2022 Discharge date: 05/15/2022  Admitted From: Home Disposition: [name]  Recommendations for Outpatient Follow-up:  Follow up with hospice provider  Discharge Condition: Guarded/gram CODE STATUS: DNR Diet recommendation: Comfort feeds as tolerates  History of present illness:  Kevin [name] is a 76 y.o. 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  On EMS arrival, patient was noted to have a glucose of 62 and was administered D10; and transported to the ED for further evaluation.   Patient was recently admitted to [name] with fractured hip and discharged to [name] Mar 29, 2022.  Hospitalization he developed urinary retention and a Foley catheter was placed which has been present since.  Patient with another fall and return to [name] due to dislodgment of a plate in his shoulder that had to be reaffixed in an additional admission in April 10, 2022 for generalized weakness and falls.  He was noted to have home postural hypotension and started on Florinef.  Patient also continued on metformin for his diabetes.  He apparently has not been able to walk at home and having a hard time since discharge from rehab.   In the ED, temperature 97.6 F, HR 106, RR 20, BP 71/49, SPO2 93% on nasal cannula.  WBCs 11.3, hemoglobin 12.5, platelets 466.  Sodium 142,  potassium 4.2, chloride 108, CO2 22, glucose 73, BUN 24, creatinine 1.40.  AST 13, ALT 6, total bilirubin 0.9.  Ammonia level 15.  CK 33, high sensitive troponin 8.  Lactic acid 1.3.  Urinalysis with large leukocytes, negative nitrite, many bacteria, greater than 50 WBCs.  COVID-19 PCR negative.  Influenza A/B PCR negative.  TSH 0.761, within normal limits.  CT head without contrast with no evidence of acute intracranial normality, chronic ischemia with old left occipital and cerebellar infarcts.CT C-spine with no acute cervical spine fracture.  Left elbow x-ray with no fracture, dorsal soft tissue swelling.  Chest x-ray with left lower lobe pneumonia, small parapneumonic effusion.  Blood cultures x2 and urine culture obtained.  Hospital course:  Acute metabolic encephalopathy Patient presenting to ED with worsening confusion from his normal dementia baseline, was noted to have hypoglycemia in the setting of metformin use at home.  Patient was given D10 by EMS with improvement of his glucose and appears that his mentation is now back to baseline.  Patient is afebrile but with left lower lobe pneumonia likely contributing factor.  CT head without contrast with no acute findings.  Continue supportive care now transitioning to [facility].   Community-acquired pneumonia, suspect gram-negative organism Patient presenting with confusion from his typical baseline dementia, WBC count elevated 12.2 but afebrile.  Chest x-ray with left lower lobe infiltrate.  SPO2 stable on room air.  Patient was started on azithromycin and ceftriaxone during hospitalization and will now discontinue on transition to residential hospice.   Hypoglycemia Hx type 2 diabetes mellitus At baseline on metformin 500 mg p.o. twice daily.  Hemoglobin A1c  5.3 on 01/08/2022, well controlled.  On EMS arrival glucose noted to be 62 required D10.  Patient's home metformin was discontinued and supported with D5 IV fluid  hydration.  Now transitioning to residential hospice.   Chronic combined systolic and diastolic congestive heart failure Hx sick sinus syndrome s/p AICD/PPM Follows with cardiology outpatient, Dr. Rayann Heman.  Home medications include carvedilol 3.125 mg p.o. daily.  Patient with mild hypotension on admission in the setting of autonomic dysfunction now on Florinef and midodrine.  Discontinue carvedilol, Florinef and midodrine on transition to residential hospice.   Hx essential hypertension Hx postural hypotension/autonomic dysfunction Patient on carvedilol 3.125 mg p.o. daily, but with hypotension on admission.  Now with history of postural hypotension and autonomic dysfunction currently on Florinef and midodrine.  Discontinue carvedilol, midodrine, Florinef, aspirin, statin on transition to residential hospice.   Dementia Delirium precautions, encourage a familiar face to remain present throughout the day, minimize the use of opioids/benzodiazepines   Anxiety/depression: Discontinue home Wellbutrin/Ativan.   HLD: Discontinued atorvastatin on transition to residential hospice.   CKD stage IIIb Creatinine baseline 1.4-1.5.  Creatinine on admission 1.40; stable   Chronic urinary retention BPH Patient presenting with Foley catheter in place, previous hospitalization May 2023 required Foley catheter placement.  Continue Foley catheter on discharge for comfort.   Severe protein calorie malnutrition Adult failure to thrive Body mass index is 14.81 kg/m. Patient with significant muscle wasting, fat loss on physical exam.   Weakness/deconditioning/debility/frequent falls: Discharging to residential hospice for end-of-life care  Discharge Diagnoses:  Principal Problem:   CAP (community acquired pneumonia) Active Problems:   Acute metabolic encephalopathy   Chronic combined systolic and diastolic heart failure ([place])   Hypertension   Type 2 diabetes mellitus with hyperlipidemia ([place])    Hyperlipidemia   Dementia ([place])   Anxiety   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 in situ   Chronic kidney disease, stage 3 unspecified ([place])   Urinary retention with incomplete bladder emptying   FTT (failure to thrive) in adult   Hypoglycemia associated with type 2 diabetes mellitus ([place])   Protein-calorie malnutrition, severe    Discharge Instructions  Discharge Instructions     Diet - low sodium heart healthy   Complete by: As directed    Increase activity slowly   Complete by: As directed       Allergies as of 05/15/2022       Reactions   Sulfa Antibiotics Swelling   Swelling of lips only.   5-alpha Reductase Inhibitors    Patient and wife state OK for patient to take finasteride - has not taken it before, NKA   Glipizide    Other reaction(s): Other (See Comments)   Metformin    Other reaction(s): Dizziness (intolerance)   Shellfish Allergy Swelling        Medication List     STOP taking these medications    acetaminophen 325 MG tablet Commonly known as: TYLENOL   alfuzosin 10 MG 24 hr tablet Commonly known as: UROXATRAL   aspirin EC 81 MG tablet   atorvastatin 80 MG tablet Commonly known as: LIPITOR   buPROPion 150 MG 24 hr tablet Commonly known as: WELLBUTRIN XL   carvedilol 3.125 MG tablet Commonly known as: COREG   cephALEXin 500 MG capsule Commonly known as: KEFLEX   cholecalciferol 25 MCG (1000 UNIT) tablet Commonly known as: VITAMIN D   clopidogrel 75 MG tablet Commonly known as: PLAVIX   docusate sodium 100 MG capsule Commonly known as: COLACE  donepezil 5 MG tablet Commonly known as: ARICEPT   enoxaparin 40 MG/0.4ML injection Commonly known as: LOVENOX   EYE DROPS OP   feeding supplement Liqd   finasteride 5 MG tablet Commonly known as: PROSCAR   fludrocortisone 0.1 MG tablet Commonly known as: FLORINEF   FreeStyle Libre 14 Day Sensor Misc   HYDROcodone-acetaminophen 5-325 MG  tablet Commonly known as: NORCO/VICODIN   loratadine 10 MG tablet Commonly known as: CLARITIN   LORazepam 0.5 MG tablet Commonly known as: ATIVAN   megestrol 400 MG/10ML suspension Commonly known as: MEGACE   memantine 10 MG tablet Commonly known as: NAMENDA   metFORMIN 500 MG tablet Commonly known as: GLUCOPHAGE   midodrine 5 MG tablet Commonly known as: PROAMATINE   mirtazapine 15 MG disintegrating tablet Commonly known as: REMERON SOL-TAB   multivitamin with minerals Tabs tablet   polyethylene glycol 17 g packet Commonly known as: MIRALAX / GLYCOLAX   senna-docusate 8.6-50 MG tablet Commonly known as: Senokot-S   Simbrinza 1-0.2 % Susp Generic drug: Brinzolamide-Brimonidine   traZODone 50 MG tablet Commonly known as: DESYREL        Allergies  Allergen Reactions   Sulfa Antibiotics Swelling    Swelling of lips only.   5-Alpha Reductase Inhibitors     Patient and wife state OK for patient to take finasteride - has not taken it before, NKA   Glipizide     Other reaction(s): Other (See Comments)   Metformin     Other reaction(s): Dizziness (intolerance)   Shellfish Allergy Swelling    Consultations: None   Procedures/Studies: DG Elbow Complete Left  Result Date: 05/13/2022 CLINICAL DATA:  [place], elbow swelling EXAM: LEFT ELBOW - COMPLETE 3+ VIEW COMPARISON:  03/09/2022 FINDINGS: Frontal, bilateral oblique, lateral views of the left elbow are obtained. No acute fracture, subluxation, or dislocation. Stable osteoarthritis. No joint effusion. Mild dorsal soft tissue swelling. IMPRESSION: 1. No acute fracture. 2. Stable osteoarthritis. 3. Dorsal soft tissue swelling. Electronically Signed   By: Randa Ngo M.D.   On: 05/13/2022 16:50   DG Chest 2 View  Result Date: 05/13/2022 CLINICAL DATA:  Failure to thrive, cough EXAM: CHEST - 2 VIEW COMPARISON:  03/09/2022 FINDINGS: Frontal and lateral views of the chest demonstrate an unremarkable cardiac silhouette.  Multi lead pacer unchanged. Interval development of left lower lobe airspace disease, obscuring the posterior costophrenic angle. Small left pleural effusion is suspected. No pneumothorax. No acute bony abnormalities. Postsurgical changes left humeral ORIF. IMPRESSION: 1. Left lower lobe pneumonia and small left parapneumonic effusion. Electronically Signed   By: Randa Ngo M.D.   On: 05/13/2022 16:49   CT Head Wo Contrast  Result Date: 05/13/2022 CLINICAL DATA:  Mental status change, unknown cause; Neck trauma (Age >= 65y). Fall. Slurred speech. EXAM: CT HEAD WITHOUT CONTRAST CT CERVICAL SPINE WITHOUT CONTRAST TECHNIQUE: Multidetector CT imaging of the head and cervical spine was performed following the standard protocol without intravenous contrast. Multiplanar CT image reconstructions of the cervical spine were also generated. RADIATION DOSE REDUCTION: This exam was performed according to the departmental dose-optimization program which includes automated exposure control, adjustment of the mA and/or kV according to patient size and/or use of iterative reconstruction technique. COMPARISON:  CT head and cervical spine 03/09/2022 FINDINGS: CT HEAD FINDINGS Brain: There is no evidence of an acute infarct, intracranial hemorrhage, mass, midline shift, or extra-axial fluid collection. Mild cerebral atrophy is unchanged. Hypodensities in the cerebral white matter bilaterally are unchanged and nonspecific but compatible with mild  chronic small vessel ischemic disease. Small chronic infarcts are again noted in the left occipital lobe and both cerebellar hemispheres. Vascular: Calcified atherosclerosis at the skull base. No hyperdense vessel. Skull: No fracture or suspicious osseous lesion. Sinuses/Orbits: Visualized paranasal sinuses are clear. Trace chronic left mastoid effusion. Bilateral cataract extraction. Other: None. CT CERVICAL SPINE FINDINGS Alignment: Unchanged trace anterolisthesis of C7 on T1. Skull  base and vertebrae: No acute fracture or suspicious osseous lesion. Soft tissues and spinal canal: No prevertebral fluid or swelling. No visible canal hematoma. Disc levels: Unchanged cervical spondylosis. Interbody ankylosis at C5-6. Moderate spinal stenosis at C4-5 and C5-6 due to disc bulging, spurring, and posterior longitudinal ligament ossification. Mild right neural foraminal stenosis at C4-5 due to uncovertebral spurring and asymmetrically severe right facet arthrosis. Upper chest: Mild biapical pleuroparenchymal lung scarring. Other: None. IMPRESSION: 1. No evidence of acute intracranial abnormality. 2. 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 3. No acute cervical spine fracture. Electronically Signed   By: Logan Bores M.D.   On: 05/13/2022 16:42   CT Cervical Spine Wo Contrast  Result Date: 05/13/2022 CLINICAL DATA:  Mental status change, unknown cause; Neck trauma (Age >= 65y). Fall. Slurred speech. EXAM: CT HEAD WITHOUT CONTRAST CT CERVICAL SPINE WITHOUT CONTRAST TECHNIQUE: Multidetector CT imaging of the head and cervical spine was performed following the standard protocol without intravenous contrast. Multiplanar CT image reconstructions of the cervical spine were also generated. RADIATION DOSE REDUCTION: This exam was performed according to the departmental dose-optimization program which includes automated exposure control, adjustment of the mA and/or kV according to patient size and/or use of iterative reconstruction technique. COMPARISON:  CT head and cervical spine 03/09/2022 FINDINGS: CT HEAD FINDINGS Brain: There is no evidence of an acute infarct, intracranial hemorrhage, mass, midline shift, or extra-axial fluid collection. Mild cerebral atrophy is unchanged. Hypodensities in the cerebral white matter bilaterally are unchanged and nonspecific but compatible with mild chronic small vessel ischemic disease. Small chronic infarcts are again noted in the left occipital  lobe and both cerebellar hemispheres. Vascular: Calcified atherosclerosis at the skull base. No hyperdense vessel. Skull: No fracture or suspicious osseous lesion. Sinuses/Orbits: Visualized paranasal sinuses are clear. Trace chronic left mastoid effusion. Bilateral cataract extraction. Other: None. CT CERVICAL SPINE FINDINGS Alignment: Unchanged trace anterolisthesis of C7 on T1. Skull base and vertebrae: No acute fracture or suspicious osseous lesion. Soft tissues and spinal canal: No prevertebral fluid or swelling. No visible canal hematoma. Disc levels: Unchanged cervical spondylosis. Interbody ankylosis at C5-6. Moderate spinal stenosis at C4-5 and C5-6 due to disc bulging, spurring, and posterior longitudinal ligament ossification. Mild right neural foraminal stenosis at C4-5 due to uncovertebral spurring and asymmetrically severe right facet arthrosis. Upper chest: Mild biapical pleuroparenchymal lung scarring. Other: None. IMPRESSION: 1. No evidence of acute intracranial abnormality. 2. 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 3. No acute cervical spine fracture. Electronically Signed   By: Logan Bores M.D.   On: 05/13/2022 16:42     Subjective: Patient seen examined bedside, resting comfortably.  Lying in bed.  No family present this morning.  Pleasantly confused.  Discharging to [facility] today.  Denies pain.  No acute events reported by nursing staff overnight.  Discharge Exam: Vitals:   05/14/22 1430 05/14/22 2043  BP: 113/63 113/72  Pulse: 79 93  Resp: 16 18  Temp: 97.9 F (36.6 C) 98 F (36.7 C)  SpO2: (!) 88% 93%   Vitals:   05/14/22  1022 05/14/22 1430 05/14/22 2043 05/15/22 0627  BP: 112/64 113/63 113/72   Pulse: 73 79 93   Resp: '18 16 18   '$ Temp: 98 F (36.7 C) 97.9 F (36.6 C) 98 F (36.7 C)   TempSrc: Oral Oral Oral   SpO2: 90% (!) 88% 93%   Weight:    55.4 kg  Height:        Physical Exam: GEN: NAD, alert, pleasantly  confused, thin/cachectic, chronically ill in appearance HEENT: NCAT, PERRL, EOMI, sclera clear, MMM, noted temporal muscle wasting PULM: CTAB w/o wheezes/crackles, normal respiratory effort, on room air CV: RRR w/o M/G/R, noted left chest PPM/AICD device GI: abd soft, NTND, NABS GU: Foley catheter noted in place with yellow urine draining in collection canister MSK: no peripheral edema, moves all extremities independently NEURO: No focal deficits Integumentary: dry/intact, no rashes or wounds    The results of significant diagnostics from this hospitalization (including imaging, microbiology, ancillary and laboratory) are listed below for reference.     Microbiology: Recent Results (from the past 240 hour(s))  Resp Panel by RT-PCR (Flu A&B, Covid) Anterior Nasal Swab     Status: None   Collection Time: 05/13/22  8:00 PM   Specimen: Anterior Nasal Swab  Result Value Ref Range Status   SARS Coronavirus 2 by RT PCR NEGATIVE NEGATIVE Final    Comment: (NOTE) SARS-CoV-2 target nucleic acids are NOT DETECTED.  The SARS-CoV-2 RNA is generally detectable in upper respiratory specimens during the acute phase of infection. The lowest concentration of SARS-CoV-2 viral copies this assay can detect is 138 copies/mL. A negative result does not preclude SARS-Cov-2 infection and should not be used as the sole basis for treatment or other patient management decisions. A negative result may occur with  improper specimen collection/handling, submission of specimen other than nasopharyngeal swab, presence of viral mutation(s) within the areas targeted by this assay, and inadequate number of viral copies(<138 copies/mL). A negative result must be combined with clinical observations, patient history, and epidemiological information. The expected result is Negative.  Fact Sheet for Patients:  EntrepreneurPulse.com.au  Fact Sheet for Healthcare Providers:   IncredibleEmployment.be  This test is no t yet approved or cleared by the [place] FDA and  has been authorized for detection and/or diagnosis of SARS-CoV-2 by FDA under an Emergency Use Authorization (EUA). This EUA will remain  in effect (meaning this test can be used) for the duration of the COVID-19 declaration under Section 564(b)(1) of the Act, 21 U.S.C.section 360bbb-3(b)(1), unless the authorization is terminated  or revoked sooner.       Influenza A by PCR NEGATIVE NEGATIVE Final   Influenza B by PCR NEGATIVE NEGATIVE Final    Comment: (NOTE) The Xpert Xpress SARS-CoV-2/FLU/RSV plus assay is intended as an aid in the diagnosis of influenza from Nasopharyngeal swab specimens and should not be used as a sole basis for treatment. Nasal washings and aspirates are unacceptable for Xpert Xpress SARS-CoV-2/FLU/RSV testing.  Fact Sheet for Patients: EntrepreneurPulse.com.au  Fact Sheet for Healthcare Providers: IncredibleEmployment.be  This test is not yet approved or cleared by the [place] FDA and has been authorized for detection and/or diagnosis of SARS-CoV-2 by FDA under an Emergency Use Authorization (EUA). This EUA will remain in effect (meaning this test can be used) for the duration of the COVID-19 declaration under Section 564(b)(1) of the Act, 21 U.S.C. section 360bbb-3(b)(1), unless the authorization is terminated or revoked.  Performed at [facility], [address]  [place], [place] [address]   Culture, blood (routine x 2) Call MD if unable to obtain prior to antibiotics being given     Status: None (Preliminary result)   Collection Time: 05/14/22 12:17 AM   Specimen: BLOOD  Result Value Ref Range Status   Specimen Description   Final    BLOOD RIGHT ANTECUBITAL Performed at [facility] [address] [place], [place] [address]    Special Requests    Final    BOTTLES DRAWN AEROBIC ONLY Blood Culture results may not be optimal due to an inadequate volume of blood received in culture bottles Performed at [facility] [address], [place], [place] [address]    Culture   Final    NO GROWTH 1 DAY Performed at [facility], [place] [address], [place], [place] [address]    Report Status PENDING  Incomplete  Culture, blood (routine x 2) Call MD if unable to obtain prior to antibiotics being given     Status: None (Preliminary result)   Collection Time: 05/14/22 12:17 AM   Specimen: BLOOD  Result Value Ref Range Status   Specimen Description   Final    BLOOD RIGHT ANTECUBITAL Performed at [facility] [address], [place], [place] [address]    Special Requests   Final    BOTTLES DRAWN AEROBIC ONLY Blood Culture adequate volume Performed at [facility] [address], [place], [place] [address]    Culture   Final    NO GROWTH 1 DAY Performed at [facility], [place] [address] [place], [place] [address]    Report Status PENDING  Incomplete  MRSA Next Gen by PCR, Nasal     Status: None   Collection Time: 05/14/22 12:49 AM   Specimen: Nasal Mucosa; Nasal Swab  Result Value Ref Range Status   MRSA by PCR Next Gen NOT DETECTED NOT DETECTED Final    Comment: (NOTE) The GeneXpert MRSA Assay (FDA approved for NASAL specimens only), is one component of a comprehensive MRSA colonization surveillance program. It is not intended to diagnose MRSA infection nor to guide or monitor treatment for MRSA infections. Test performance is not FDA approved in patients less than 70 years old. Performed at [facility], [place] [address], [place], [place] [address]      Labs: BNP (last 3 results) No results for input(s): "BNP" in the last 8760 hours. Basic Metabolic Panel: Recent Labs  Lab 05/13/22 1827 05/14/22 0017  NA 142 140  K 4.2 4.3  CL 108 109  CO2  22 21*  GLUCOSE 73 138*  BUN 24* 25*  CREATININE 1.40* 1.36*  CALCIUM 8.7* 8.6*  MG 2.0  --   PHOS 3.9  --    Liver Function Tests: Recent Labs  Lab 05/13/22 1827 05/14/22 0017  AST 13* 13*  ALT 6 6  ALKPHOS 105 100  BILITOT 0.9 1.0  PROT 6.7 6.4*  ALBUMIN 2.5* 2.4*   No results for input(s): "LIPASE", "AMYLASE" in the last 168 hours. Recent Labs  Lab 05/14/22 0017  AMMONIA 15   CBC: Recent Labs  Lab 05/13/22 1725 05/14/22 0017 05/15/22 0838  WBC 11.3* 12.2* 9.5  NEUTROABS 9.8*  --   --   HGB 12.5* 10.2* 9.6*  HCT 40.5 33.3* 31.2*  MCV 101.0* 101.5* 100.0  PLT 466* 361 284   Cardiac Enzymes: Recent Labs  Lab 05/13/22 1827  CKTOTAL 33*   BNP:  Invalid input(s): "POCBNP" CBG: Recent Labs  Lab 05/14/22 1017 05/14/22 1213 05/14/22 1731 05/14/22 2045 05/15/22 0720  GLUCAP 159* 177* 161* 123* 136*   D-Dimer No results for input(s): "DDIMER" in the last 72 hours. Hgb A1c No results for input(s): "HGBA1C" in the last 72 hours. Lipid Profile No results for input(s): "CHOL", "HDL", "LDLCALC", "TRIG", "CHOLHDL", "LDLDIRECT" in the last 72 hours. Thyroid function studies Recent Labs    05/14/22 0017  TSH 0.761   Anemia work up Recent Labs    05/13/22 1725 05/14/22 0017  VITAMINB12  --  776  FOLATE  --  7.9  FERRITIN  --  341*  TIBC  --  165*  IRON  --  23*  RETICCTPCT 1.6  --    Urinalysis    Component Value Date/Time   COLORURINE YELLOW 05/13/2022 1827   APPEARANCEUR TURBID (A) 05/13/2022 1827   LABSPEC 1.025 05/13/2022 1827   PHURINE 5.0 05/13/2022 1827   GLUCOSEU NEGATIVE 05/13/2022 1827   GLUCOSEU NEGATIVE 02/06/2020 1609   HGBUR MODERATE (A) 05/13/2022 1827   BILIRUBINUR NEGATIVE 05/13/2022 1827   KETONESUR 20 (A) 05/13/2022 1827   PROTEINUR 100 (A) 05/13/2022 1827   UROBILINOGEN 0.2 02/06/2020 1609   NITRITE NEGATIVE 05/13/2022 1827   LEUKOCYTESUR LARGE (A) 05/13/2022 1827   Sepsis Labs Recent Labs  Lab 05/13/22 1725  05/14/22 0017 05/15/22 0838  WBC 11.3* 12.2* 9.5   Microbiology Recent Results (from the past 240 hour(s))  Resp Panel by RT-PCR (Flu A&B, Covid) Anterior Nasal Swab     Status: None   Collection Time: 05/13/22  8:00 PM   Specimen: Anterior Nasal Swab  Result Value Ref Range Status   SARS Coronavirus 2 by RT PCR NEGATIVE NEGATIVE Final    Comment: (NOTE) SARS-CoV-2 target nucleic acids are NOT DETECTED.  The SARS-CoV-2 RNA is generally detectable in upper respiratory specimens during the acute phase of infection. The lowest concentration of SARS-CoV-2 viral copies this assay can detect is 138 copies/mL. A negative result does not preclude SARS-Cov-2 infection and should not be used as the sole basis for treatment or other patient management decisions. A negative result may occur with  improper specimen collection/handling, submission of specimen other than nasopharyngeal swab, presence of viral mutation(s) within the areas targeted by this assay, and inadequate number of viral copies(<138 copies/mL). A negative result must be combined with clinical observations, patient history, and epidemiological information. The expected result is Negative.  Fact Sheet for Patients:  EntrepreneurPulse.com.au  Fact Sheet for Healthcare Providers:  IncredibleEmployment.be  This test is no t yet approved or cleared by the [place] FDA and  has been authorized for detection and/or diagnosis of SARS-CoV-2 by FDA under an Emergency Use Authorization (EUA). This EUA will remain  in effect (meaning this test can be used) for the duration of the COVID-19 declaration under Section 564(b)(1) of the Act, 21 U.S.C.section 360bbb-3(b)(1), unless the authorization is terminated  or revoked sooner.       Influenza A by PCR NEGATIVE NEGATIVE Final   Influenza B by PCR NEGATIVE NEGATIVE Final    Comment: (NOTE) The Xpert Xpress SARS-CoV-2/FLU/RSV plus assay is  intended as an aid in the diagnosis of influenza from Nasopharyngeal swab specimens and should not be used as a sole basis for treatment. Nasal washings and aspirates are unacceptable for Xpert Xpress SARS-CoV-2/FLU/RSV testing.  Fact Sheet for Patients: EntrepreneurPulse.com.au  Fact Sheet for Healthcare Providers: IncredibleEmployment.be  This test is not yet approved or cleared  by the [place] and has been authorized for detection and/or diagnosis of SARS-CoV-2 by FDA under an Emergency Use Authorization (EUA). This EUA will remain in effect (meaning this test can be used) for the duration of the COVID-19 declaration under Section 564(b)(1) of the Act, 21 U.S.C. section 360bbb-3(b)(1), unless the authorization is terminated or revoked.  Performed at [facility], [place] [address], [place], [place] [address]   Culture, blood (routine x 2) Call MD if unable to obtain prior to antibiotics being given     Status: None (Preliminary result)   Collection Time: 05/14/22 12:17 AM   Specimen: BLOOD  Result Value Ref Range Status   Specimen Description   Final    BLOOD RIGHT ANTECUBITAL Performed at [place] [address] [place], [place] [address]    Special Requests   Final    BOTTLES DRAWN AEROBIC ONLY Blood Culture results may not be optimal due to an inadequate volume of blood received in culture bottles Performed at [place] [address], [place], [place] [address]    Culture   Final    NO GROWTH 1 DAY Performed at [facility], [place] [address] [place], [place] [address]    Report Status PENDING  Incomplete  Culture, blood (routine x 2) Call MD if unable to obtain prior to antibiotics being given     Status: None (Preliminary result)   Collection Time: 05/14/22 12:17 AM   Specimen: BLOOD  Result Value Ref Range Status   Specimen Description   Final    BLOOD  RIGHT ANTECUBITAL Performed at [place] [address] [place], [place] [address]    Special Requests   Final    BOTTLES DRAWN AEROBIC ONLY Blood Culture adequate volume Performed at [place] [address], [place], [place] [address]    Culture   Final    NO GROWTH 1 DAY Performed at [facility], [place] [address], [place], [place] [address]    Report Status PENDING  Incomplete  MRSA Next Gen by PCR, Nasal     Status: None   Collection Time: 05/14/22 12:49 AM   Specimen: Nasal Mucosa; Nasal Swab  Result Value Ref Range Status   MRSA by PCR Next Gen NOT DETECTED NOT DETECTED Final    Comment: (NOTE) The GeneXpert MRSA Assay (FDA approved for NASAL specimens only), is one component of a comprehensive MRSA colonization surveillance program. It is not intended to diagnose MRSA infection nor to guide or monitor treatment for MRSA infections. Test performance is not FDA approved in patients less than 42 years old. Performed at [facility], [place] [address], [place],  [address]      Time coordinating discharge: Over 30 minutes  SIGNED:   Glenn Gullickson J [place] DO  [facility] 05/15/2022, 9:56 AM

## 2022-05-15 NOTE — Progress Notes (Addendum)
Manufacturing engineer [facility]) Hospital Liaison Note   Bed has been offered to family and they have accepted. [name]/wife is aware that ICD will need to be deactived prior to transfer. Attending provider/Dr. [place] has consulted [employer] for procedure.   Once patient is clear for DC, Please send signed DNR form with patient and RN call report to [phone].   Addendum: 1:35p ICD has been deactivated per MD/Dr. [place] Transport arranged with [employer]. EMS.   Daphene Calamity, MSW [facility] Liaison [phone]

## 2022-05-15 NOTE — Plan of Care (Signed)
  Problem: Education: Goal: Knowledge of General Education information will improve Description: Including pain rating scale, medication(s)/side effects and non-pharmacologic comfort measures Outcome: Completed/Met   Problem: Health Behavior/Discharge Planning: Goal: Ability to manage health-related needs will improve Outcome: Completed/Met   Problem: Clinical Measurements: Goal: Ability to maintain clinical measurements within normal limits will improve Outcome: Completed/Met Goal: Will remain free from infection Outcome: Completed/Met Goal: Diagnostic test results will improve Outcome: Completed/Met Goal: Respiratory complications will improve Outcome: Completed/Met Goal: Cardiovascular complication will be avoided Outcome: Completed/Met   Problem: Activity: Goal: Risk for activity intolerance will decrease Outcome: Completed/Met   Problem: Nutrition: Goal: Adequate nutrition will be maintained Outcome: Completed/Met   Problem: Coping: Goal: Level of anxiety will decrease Outcome: Completed/Met   Problem: Elimination: Goal: Will not experience complications related to bowel motility Outcome: Completed/Met Goal: Will not experience complications related to urinary retention Outcome: Completed/Met   Problem: Pain Managment: Goal: General experience of comfort will improve Outcome: Completed/Met   Problem: Safety: Goal: Ability to remain free from injury will improve Outcome: Completed/Met   Problem: Skin Integrity: Goal: Risk for impaired skin integrity will decrease Outcome: Completed/Met   Problem: Activity: Goal: Ability to tolerate increased activity will improve Outcome: Completed/Met   Problem: Clinical Measurements: Goal: Ability to maintain a body temperature in the normal range will improve Outcome: Completed/Met   Problem: Respiratory: Goal: Ability to maintain adequate ventilation will improve Outcome: Completed/Met Goal: Ability to maintain a  clear airway will improve Outcome: Completed/Met   

## 2022-05-15 NOTE — TOC Progression Note (Addendum)
Transition of Care [facility]) - Progression Note    Patient Details  Name: Kevin [name] MRN: [id number] Date of Birth: 28-Feb-1946  Transition of [facility]) CM/SW Contact  Leeroy Cha, RN Phone Number: 05/15/2022, 10:19 AM  Clinical Narrative:    [name] with [facility] .  Message left for her to call me back about possible transfer to [facility] today. Tct afterhours number at [phone]. Chat message to shani DANIELS PT DOES HAVE A BED AND SHE WILL DO THE TRANSPORT. Expected Discharge Plan: [place] Barriers to Discharge: Continued Medical Work up  Expected Discharge Plan and Services Expected Discharge Plan: [place]   Discharge Planning Services: CM Consult Post Acute Care Choice: Residential Hospice Bed Living arrangements for the past 2 months: Single Family Home Expected Discharge Date: 05/15/22                                     Social Determinants of Health (SDOH) Interventions    Readmission Risk Interventions    01/11/2022    1:27 PM  Readmission Risk Prevention Plan  Transportation Screening Complete  PCP or Specialist Appt within 3-5 Days Complete  HRI or [place] Complete  Social Work Consult for [place] Planning/Counseling Complete  Palliative Care Screening Not Applicable  Medication Review Press photographer) Complete

## 2022-05-17 LAB — LEGIONELLA PNEUMOPHILA SEROGP 1 UR AG: L. pneumophila Serogp 1 Ur Ag: NEGATIVE

## 2022-05-19 LAB — CULTURE, BLOOD (ROUTINE X 2)
Culture: NO GROWTH
Culture: NO GROWTH
Special Requests: ADEQUATE

## 2022-05-31 ENCOUNTER — Ambulatory Visit: Payer: Medicare HMO | Admitting: Physician Assistant

## 2022-06-08 DEATH — deceased

## 2022-09-10 ENCOUNTER — Ambulatory Visit: Payer: Medicare HMO | Admitting: Neurology
